# Patient Record
Sex: Male | Born: 1970 | Race: Black or African American | Hispanic: No | Smoking: Current every day smoker
Health system: Southern US, Community
[De-identification: ages and names within clinical notes are randomized; demographics above are authoritative.]

## PROBLEM LIST (undated history)

## (undated) DIAGNOSIS — I219 Acute myocardial infarction, unspecified: Secondary | ICD-10-CM

## (undated) DIAGNOSIS — I2510 Atherosclerotic heart disease of native coronary artery without angina pectoris: Secondary | ICD-10-CM

## (undated) DIAGNOSIS — I213 ST elevation (STEMI) myocardial infarction of unspecified site: Secondary | ICD-10-CM

## (undated) DIAGNOSIS — J45909 Unspecified asthma, uncomplicated: Secondary | ICD-10-CM

## (undated) DIAGNOSIS — Z5900 Homelessness unspecified: Secondary | ICD-10-CM

## (undated) DIAGNOSIS — I639 Cerebral infarction, unspecified: Secondary | ICD-10-CM

## (undated) DIAGNOSIS — D496 Neoplasm of unspecified behavior of brain: Secondary | ICD-10-CM

## (undated) DIAGNOSIS — Z91148 Patient's other noncompliance with medication regimen for other reason: Secondary | ICD-10-CM

## (undated) DIAGNOSIS — I5022 Chronic systolic (congestive) heart failure: Secondary | ICD-10-CM

## (undated) DIAGNOSIS — F1010 Alcohol abuse, uncomplicated: Secondary | ICD-10-CM

## (undated) DIAGNOSIS — N183 Chronic kidney disease, stage 3 unspecified: Secondary | ICD-10-CM

## (undated) DIAGNOSIS — F1410 Cocaine abuse, uncomplicated: Secondary | ICD-10-CM

---

## 2002-03-28 ENCOUNTER — Encounter: Payer: Self-pay | Admitting: Emergency Medicine

## 2002-03-28 ENCOUNTER — Emergency Department (HOSPITAL_COMMUNITY): Admission: EM | Admit: 2002-03-28 | Discharge: 2002-03-28 | Payer: Self-pay | Admitting: Emergency Medicine

## 2006-07-18 ENCOUNTER — Emergency Department (HOSPITAL_COMMUNITY): Admission: EM | Admit: 2006-07-18 | Discharge: 2006-07-18 | Payer: Self-pay | Admitting: Emergency Medicine

## 2009-01-04 ENCOUNTER — Emergency Department (HOSPITAL_COMMUNITY): Admission: EM | Admit: 2009-01-04 | Discharge: 2009-01-04 | Payer: Self-pay | Admitting: Emergency Medicine

## 2009-12-13 ENCOUNTER — Emergency Department (HOSPITAL_COMMUNITY): Admission: EM | Admit: 2009-12-13 | Discharge: 2009-12-13 | Payer: Self-pay | Admitting: Emergency Medicine

## 2011-12-31 ENCOUNTER — Emergency Department (HOSPITAL_COMMUNITY)
Admission: EM | Admit: 2011-12-31 | Discharge: 2011-12-31 | Disposition: A | Discharge status: Home / Self Care | Payer: Self-pay | Attending: Emergency Medicine | Admitting: Emergency Medicine

## 2011-12-31 ENCOUNTER — Ambulatory Visit (HOSPITAL_COMMUNITY): Payer: Self-pay

## 2011-12-31 ENCOUNTER — Encounter (HOSPITAL_COMMUNITY): Payer: Self-pay | Admitting: Emergency Medicine

## 2011-12-31 DIAGNOSIS — R05 Cough: Secondary | ICD-10-CM | POA: Insufficient documentation

## 2011-12-31 DIAGNOSIS — R059 Cough, unspecified: Secondary | ICD-10-CM | POA: Insufficient documentation

## 2011-12-31 DIAGNOSIS — R222 Localized swelling, mass and lump, trunk: Secondary | ICD-10-CM | POA: Insufficient documentation

## 2011-12-31 HISTORY — DX: Unspecified asthma, uncomplicated: J45.909

## 2011-12-31 NOTE — ED Notes (Signed)
Pt c/o chest congestion and cough with productive cough x several days; pt sts out of asthma inhaler

## 2013-03-23 ENCOUNTER — Emergency Department (HOSPITAL_COMMUNITY)
Admission: EM | Admit: 2013-03-23 | Discharge: 2013-03-23 | Disposition: A | Discharge status: Home / Self Care | Payer: Self-pay | Attending: Emergency Medicine | Admitting: Emergency Medicine

## 2013-03-23 ENCOUNTER — Emergency Department (HOSPITAL_COMMUNITY): Payer: Self-pay

## 2013-03-23 ENCOUNTER — Encounter (HOSPITAL_COMMUNITY): Payer: Self-pay | Admitting: Radiology

## 2013-03-23 DIAGNOSIS — Z23 Encounter for immunization: Secondary | ICD-10-CM | POA: Insufficient documentation

## 2013-03-23 DIAGNOSIS — S46909A Unspecified injury of unspecified muscle, fascia and tendon at shoulder and upper arm level, unspecified arm, initial encounter: Secondary | ICD-10-CM | POA: Insufficient documentation

## 2013-03-23 DIAGNOSIS — F17200 Nicotine dependence, unspecified, uncomplicated: Secondary | ICD-10-CM | POA: Insufficient documentation

## 2013-03-23 DIAGNOSIS — S022XXA Fracture of nasal bones, initial encounter for closed fracture: Secondary | ICD-10-CM | POA: Insufficient documentation

## 2013-03-23 DIAGNOSIS — S52123A Displaced fracture of head of unspecified radius, initial encounter for closed fracture: Secondary | ICD-10-CM | POA: Insufficient documentation

## 2013-03-23 DIAGNOSIS — T07XXXA Unspecified multiple injuries, initial encounter: Secondary | ICD-10-CM

## 2013-03-23 DIAGNOSIS — S52122A Displaced fracture of head of left radius, initial encounter for closed fracture: Secondary | ICD-10-CM

## 2013-03-23 DIAGNOSIS — F10929 Alcohol use, unspecified with intoxication, unspecified: Secondary | ICD-10-CM

## 2013-03-23 DIAGNOSIS — R040 Epistaxis: Secondary | ICD-10-CM | POA: Insufficient documentation

## 2013-03-23 DIAGNOSIS — J45909 Unspecified asthma, uncomplicated: Secondary | ICD-10-CM | POA: Insufficient documentation

## 2013-03-23 DIAGNOSIS — R51 Headache: Secondary | ICD-10-CM | POA: Insufficient documentation

## 2013-03-23 DIAGNOSIS — S4980XA Other specified injuries of shoulder and upper arm, unspecified arm, initial encounter: Secondary | ICD-10-CM | POA: Insufficient documentation

## 2013-03-23 DIAGNOSIS — F1010 Alcohol abuse, uncomplicated: Secondary | ICD-10-CM | POA: Insufficient documentation

## 2013-03-23 DIAGNOSIS — IMO0002 Reserved for concepts with insufficient information to code with codable children: Secondary | ICD-10-CM | POA: Insufficient documentation

## 2013-03-23 DIAGNOSIS — Z79899 Other long term (current) drug therapy: Secondary | ICD-10-CM | POA: Insufficient documentation

## 2013-03-23 LAB — CBC WITH DIFFERENTIAL/PLATELET
Basophils Absolute: 0 10*3/uL (ref 0.0–0.1)
HCT: 42.8 % (ref 39.0–52.0)
Lymphocytes Relative: 14 % (ref 12–46)
Monocytes Absolute: 0.6 10*3/uL (ref 0.1–1.0)
Neutro Abs: 14.4 10*3/uL — ABNORMAL HIGH (ref 1.7–7.7)
RDW: 13.9 % (ref 11.5–15.5)
WBC: 17.8 10*3/uL — ABNORMAL HIGH (ref 4.0–10.5)

## 2013-03-23 LAB — ETHANOL: Alcohol, Ethyl (B): 136 mg/dL — ABNORMAL HIGH (ref 0–11)

## 2013-03-23 LAB — BASIC METABOLIC PANEL
CO2: 21 mEq/L (ref 19–32)
Chloride: 100 mEq/L (ref 96–112)
Sodium: 137 mEq/L (ref 135–145)

## 2013-03-23 MED ORDER — SODIUM CHLORIDE 0.9 % IV SOLN: INTRAVENOUS | Status: DC

## 2013-03-23 MED ORDER — ONDANSETRON HCL 4 MG/2ML IJ SOLN
4.0000 mg | Freq: Once | INTRAMUSCULAR | Status: AC
  Administered 2013-03-23: 4 mg via INTRAVENOUS
  Filled 2013-03-23: qty 2

## 2013-03-23 MED ORDER — NAPROXEN 500 MG PO TABS: 500.0000 mg | ORAL_TABLET | Freq: Two times a day (BID) | ORAL | Status: DC

## 2013-03-23 MED ORDER — SODIUM CHLORIDE 0.9 % IV SOLN
INTRAVENOUS | Status: DC
  Administered 2013-03-23: 05:00:00 via INTRAVENOUS

## 2013-03-23 MED ORDER — HYDROMORPHONE HCL PF 1 MG/ML IJ SOLN
1.0000 mg | Freq: Once | INTRAMUSCULAR | Status: AC
  Administered 2013-03-23: 1 mg via INTRAVENOUS
  Filled 2013-03-23: qty 1

## 2013-03-23 MED ORDER — SODIUM CHLORIDE 0.9 % IV BOLUS (SEPSIS)
1000.0000 mL | Freq: Once | INTRAVENOUS | Status: AC
  Administered 2013-03-23: 1000 mL via INTRAVENOUS

## 2013-03-23 MED ORDER — HYDROCODONE-ACETAMINOPHEN 5-325 MG PO TABS: 1.0000 | ORAL_TABLET | Freq: Four times a day (QID) | ORAL | Status: DC | PRN

## 2013-03-23 MED ORDER — TETANUS-DIPHTH-ACELL PERTUSSIS 5-2.5-18.5 LF-MCG/0.5 IM SUSP
0.5000 mL | Freq: Once | INTRAMUSCULAR | Status: AC
  Administered 2013-03-23: 0.5 mL via INTRAMUSCULAR
  Filled 2013-03-23: qty 0.5
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
## 2013-03-23 NOTE — Progress Notes (Signed)
Orthopedic Tech Progress Note Patient Details:  Craig Schmidt Sep 27, 1970 [id number]  Ortho Devices Type of Ortho Device: Arm sling;Long arm splint   Haskell Flirt 03/23/2013, 6:06 AM

## 2013-03-23 NOTE — ED Notes (Signed)
Patient returned from CT

## 2013-03-23 NOTE — ED Notes (Signed)
Dr. Zackowski at bedside  
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
## 2013-03-23 NOTE — ED Notes (Signed)
Pt given bus pass ?

## 2014-12-14 ENCOUNTER — Encounter (HOSPITAL_COMMUNITY): Payer: Self-pay | Admitting: Cardiology

## 2014-12-14 ENCOUNTER — Emergency Department (HOSPITAL_COMMUNITY)
Admission: EM | Admit: 2014-12-14 | Discharge: 2014-12-14 | Disposition: A | Discharge status: Home / Self Care | Payer: No Typology Code available for payment source | Attending: Emergency Medicine | Admitting: Emergency Medicine

## 2014-12-14 DIAGNOSIS — Z791 Long term (current) use of non-steroidal anti-inflammatories (NSAID): Secondary | ICD-10-CM | POA: Diagnosis not present

## 2014-12-14 DIAGNOSIS — Z720 Tobacco use: Secondary | ICD-10-CM | POA: Insufficient documentation

## 2014-12-14 DIAGNOSIS — Z79899 Other long term (current) drug therapy: Secondary | ICD-10-CM | POA: Insufficient documentation

## 2014-12-14 DIAGNOSIS — J45909 Unspecified asthma, uncomplicated: Secondary | ICD-10-CM | POA: Diagnosis present

## 2014-12-14 DIAGNOSIS — J45901 Unspecified asthma with (acute) exacerbation: Secondary | ICD-10-CM | POA: Insufficient documentation

## 2014-12-14 MED ORDER — IPRATROPIUM BROMIDE 0.02 % IN SOLN
0.5000 mg | Freq: Once | RESPIRATORY_TRACT | Status: AC
  Administered 2014-12-14: 0.5 mg via RESPIRATORY_TRACT
  Filled 2014-12-14: qty 2.5

## 2014-12-14 MED ORDER — PREDNISONE 20 MG PO TABS
60.0000 mg | ORAL_TABLET | Freq: Once | ORAL | Status: AC
  Administered 2014-12-14: 60 mg via ORAL
  Filled 2014-12-14: qty 3

## 2014-12-14 MED ORDER — ALBUTEROL SULFATE (2.5 MG/3ML) 0.083% IN NEBU
5.0000 mg | INHALATION_SOLUTION | Freq: Once | RESPIRATORY_TRACT | Status: AC
  Administered 2014-12-14: 5 mg via RESPIRATORY_TRACT
  Filled 2014-12-14: qty 6

## 2014-12-14 MED ORDER — ALBUTEROL SULFATE (2.5 MG/3ML) 0.083% IN NEBU
2.5000 mg | INHALATION_SOLUTION | Freq: Four times a day (QID) | RESPIRATORY_TRACT | Status: DC | PRN
Start: 2014-12-14 — End: 2015-05-18

## 2014-12-14 MED ORDER — ALBUTEROL SULFATE HFA 108 (90 BASE) MCG/ACT IN AERS
2.0000 | INHALATION_SPRAY | Freq: Once | RESPIRATORY_TRACT | Status: AC
  Administered 2014-12-14: 2 via RESPIRATORY_TRACT
  Filled 2014-12-14: qty 6.7

## 2014-12-14 MED ORDER — PREDNISONE 10 MG PO TABS: ORAL_TABLET | ORAL | Status: DC

## 2014-12-14 NOTE — ED Notes (Signed)
Patient to the Ed with C/O an acute asthma attack.  States that he has been hanging insulation which made things worse. Onset was 2 days ago. Reports that he is out of his inhalers.

## 2014-12-14 NOTE — ED Provider Notes (Signed)
CSN: [id number]     Arrival date & time 12/14/14  1251 History  This chart was scribed for non-physician practitioner Jeannett Senior, [name] working with Jola Schmidt, MD by Zola Button, ED Scribe. This patient was seen in room TR09C/TR09C and the patient's care was started at 2:05 PM.    Chief Complaint  Patient presents with  . Asthma   The history is provided by the patient. No language interpreter was used.   HPI Comments: Craig Schmidt is a 44 y.o. male with a hx of asthma who presents to the Emergency Department complaining of worsening SOB that started a few days ago. Patient states that this is due to a flare-up of his asthma, worse than previous flare-ups of asthma. He also reports having chest tightness and mild throat irritation. He does have some pain with breathing. Patient is out of his inhaler. He notes that the breathing treatment he had here has provided significant relief to his symptoms. 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 Patient denies smoking.   Past Medical History  Diagnosis Date  . Asthma    History reviewed. No pertinent past surgical history. History reviewed. No pertinent family history. History  Substance Use Topics  . Smoking status: Current Every Day Smoker  . Smokeless tobacco: Not on file  . Alcohol Use: Yes    Review of Systems  Constitutional: Negative for fever and chills.  Respiratory: Positive for chest tightness and shortness of breath. Negative for cough.   Cardiovascular: Negative for chest pain.  Neurological: Negative for dizziness, weakness and headaches.      Allergies  Review of patient's allergies indicates no known allergies.  Home Medications   Prior to Admission medications   Medication Sig Start Date End Date Taking? Authorizing Provider  albuterol (PROVENTIL HFA;VENTOLIN HFA) 108 (90 BASE) MCG/ACT inhaler Inhale 2 puffs into the lungs every 6 (six) hours as needed. For  shortness of breath/wheezing    Historical Provider, MD  HYDROcodone-acetaminophen (NORCO/VICODIN) 5-325 MG per tablet Take 1-2 tablets by mouth every 6 (six) hours as needed for pain. 03/23/13   Fredia Sorrow, MD  naproxen (NAPROSYN) 500 MG tablet Take 1 tablet (500 mg total) by mouth 2 (two) times daily. 03/23/13   Fredia Sorrow, MD   BP 126/84 mmHg  Pulse 98  Temp(Src) 97.9 F (36.6 C) (Oral)  Resp 18  Ht 5\' 7"  (1.702 m)  Wt 201 lb (91.173 kg)  BMI 31.47 kg/m2  SpO2 95% Physical Exam  Constitutional: He is oriented to person, place, and time. He appears well-developed and well-nourished. No distress.  HENT:  Head: Normocephalic and atraumatic.  Right Ear: External ear normal.  Left Ear: External ear normal.  Nose: Nose normal.  Mouth/Throat: Oropharynx is clear and moist. No oropharyngeal exudate.  Eyes: Conjunctivae are normal. Pupils are equal, round, and reactive to light.  Neck: Neck supple.  Cardiovascular: Normal rate, regular rhythm and normal heart sounds.   Pulmonary/Chest: Effort normal. No respiratory distress. He has wheezes. He has no rales.  Inspiratory and expiratory wheezes bilaterally  Musculoskeletal: He exhibits no edema.  Neurological: He is alert and oriented to person, place, and time. No cranial nerve deficit.  Skin: Skin is warm and dry. No rash noted.  Psychiatric: He has a normal mood and affect. His behavior is normal.  Nursing note and vitals reviewed.   ED Course  Procedures  DIAGNOSTIC STUDIES: Oxygen Saturation is 95% on room air, adequate by  my interpretation.    COORDINATION OF CARE: 2:13 PM-Discussed treatment plan which includes prednisone, inhaler and nebulizer solution with patient/guardian at bedside and patient/guardian agreed to plan.    Labs Review Labs Reviewed - No data to display  Imaging Review No results found.   EKG Interpretation None      MDM   Final diagnoses:  Asthma exacerbation     patient is here with  acute asthma exacerbation, denies any chest pain or cough. No upper respiratory symptoms. States he was exposed to dust at work. Ran out of his inhaler. Patient is wheezing, vital signs are normal. Will try a breathing treatment and  60 mg of prednisone.  Patient is feeling much better after neb. He states he thinks is ready to go home. Breathing is nonlabored, nor stridor distress. Lungs are now clear bilaterally. Patient is not coughing, no chest pain, did not think any imaging or further testing indicated at this time. Will discharge home with a short prednisone taper, inhaler, follow up with primary care doctor.  Filed Vitals:   12/14/14 1306 12/14/14 1429  BP: 126/84 135/78  Pulse: 98 90  Temp: 97.9 F (36.6 C) 97.7 F (36.5 C)  TempSrc: Oral Oral  Resp: 18 16  Height: 5\' 7"  (1.702 m)   Weight: 201 lb (91.173 kg)   SpO2: 95% 100%   I personally performed the services described in this documentation, which was scribed in my presence. The recorded information has been reviewed and is accurate.   Jeannett Senior, [name] 12/14/14 [place], MD 12/15/14 [phone]

## 2014-12-14 NOTE — Discharge Instructions (Signed)
Use inhaler 2 puffs every 4 hours for the next 3 days then as needed. You can also do breathing treatments. Prednisone as prescribed until all gone starting tomorrow. Please follow-up with your doctor.   Asthma Asthma is a recurring condition in which the airways tighten and narrow. Asthma can make it difficult to breathe. It can cause coughing, wheezing, and shortness of breath. Asthma episodes, also called asthma attacks, range from minor to life-threatening. Asthma cannot be cured, but medicines and lifestyle changes can help control it. CAUSES Asthma is believed to be caused by inherited (genetic) and environmental factors, but its exact cause is unknown. Asthma may be triggered by allergens, lung infections, or irritants in the air. Asthma triggers are different for each person. Common triggers include:   Animal dander.  Dust mites.  Cockroaches.  Pollen from trees or grass.  Mold.  Smoke.  Air pollutants such as dust, household cleaners, hair sprays, aerosol sprays, paint fumes, strong chemicals, or strong odors.  Cold air, weather changes, and winds (which increase molds and pollens in the air).  Strong emotional expressions such as crying or laughing hard.  Stress.  Certain medicines (such as aspirin) or types of drugs (such as beta-blockers).  Sulfites in foods and drinks. Foods and drinks that may contain sulfites include dried fruit, potato chips, and sparkling grape juice.  Infections or inflammatory conditions such as the flu, a cold, or an inflammation of the nasal membranes (rhinitis).  Gastroesophageal reflux disease (GERD).  Exercise or strenuous activity. SYMPTOMS Symptoms may occur immediately after asthma is triggered or many hours later. Symptoms include:  Wheezing.  Excessive nighttime or early morning coughing.  Frequent or severe coughing with a common cold.  Chest tightness.  Shortness of breath. DIAGNOSIS  The diagnosis of asthma is made by  a review of your medical history and a physical exam. Tests may also be performed. These may include:  Lung function studies. These tests show how much air you breathe in and out.  Allergy tests.  Imaging tests such as X-rays. TREATMENT  Asthma cannot be cured, but it can usually be controlled. Treatment involves identifying and avoiding your asthma triggers. It also involves medicines. There are 2 classes of medicine used for asthma treatment:   Controller medicines. These prevent asthma symptoms from occurring. They are usually taken every day.  Reliever or rescue medicines. These quickly relieve asthma symptoms. They are used as needed and provide short-term relief. Your health care provider will help you create an asthma action plan. An asthma action plan is a written plan for managing and treating your asthma attacks. It includes a list of your asthma triggers and how they may be avoided. It also includes information on when medicines should be taken and when their dosage should be changed. An action plan may also involve the use of a device called a peak flow meter. A peak flow meter measures how well the lungs are working. It helps you monitor your condition. HOME CARE INSTRUCTIONS   Take medicines only as directed by your health care provider. Speak with your health care provider if you have questions about how or when to take the medicines.  Use a peak flow meter as directed by your health care provider. Record and keep track of readings.  Understand and use the action plan to help minimize or stop an asthma attack without needing to seek medical care.  Control your home environment in the following ways to help prevent asthma attacks:  Do not smoke. Avoid being exposed to secondhand smoke.  Change your heating and air conditioning filter regularly.  Limit your use of fireplaces and wood stoves.  Get rid of pests (such as roaches and mice) and their droppings.  Throw away  plants if you see mold on them.  Clean your floors and dust regularly. Use unscented cleaning products.  Try to have someone else vacuum for you regularly. Stay out of rooms while they are being vacuumed and for a short while afterward. If you vacuum, use a dust mask from a hardware store, a double-layered or microfilter vacuum cleaner bag, or a vacuum cleaner with a HEPA filter.  Replace carpet with wood, tile, or vinyl flooring. Carpet can trap dander and dust.  Use allergy-proof pillows, mattress covers, and box spring covers.  Wash bed sheets and blankets every week in hot water and dry them in a dryer.  Use blankets that are made of polyester or cotton.  Clean bathrooms and kitchens with bleach. If possible, have someone repaint the walls in these rooms with mold-resistant paint. Keep out of the rooms that are being cleaned and painted.  Wash hands frequently. SEEK MEDICAL CARE IF:   You have wheezing, shortness of breath, or a cough even if taking medicine to prevent attacks.  The colored mucus you cough up (sputum) is thicker than usual.  Your sputum changes from clear or white to yellow, green, gray, or bloody.  You have any problems that may be related to the medicines you are taking (such as a rash, itching, swelling, or trouble breathing).  You are using a reliever medicine more than 2-3 times per week.  Your peak flow is still at 50-79% of your personal best after following your action plan for 1 hour.  You have a fever. SEEK IMMEDIATE MEDICAL CARE IF:   You seem to be getting worse and are unresponsive to treatment during an asthma attack.  You are short of breath even at rest.  You get short of breath when doing very little physical activity.  You have difficulty eating, drinking, or talking due to asthma symptoms.  You develop chest pain.  You develop a fast heartbeat.  You have a bluish color to your lips or fingernails.  You are light-headed, dizzy, or  faint.  Your peak flow is less than 50% of your personal best. MAKE SURE YOU:   Understand these instructions.  Will watch your condition.  Will get help right away if you are not doing well or get worse. Document Released: 07/09/2005 Document Revised: 11/23/2013 Document Reviewed: 02/05/2013 [facility] Patient Information 2015 [place], [place]. This information is not intended to replace advice given to you by your health care provider. Make sure you discuss any questions you have with your health care provider.   Asthma, Acute Bronchospasm Acute bronchospasm caused by asthma is also referred to as an asthma attack. Bronchospasm means your air passages become narrowed. The narrowing is caused by inflammation and tightening of the muscles in the air tubes (bronchi) in your lungs. This can make it hard to breathe or cause you to wheeze and cough. CAUSES Possible triggers are:  Animal dander from the skin, hair, or feathers of animals.  Dust mites contained in house dust.  Cockroaches.  Pollen from trees or grass.  Mold.  Cigarette or tobacco smoke.  Air pollutants such as dust, household cleaners, hair sprays, aerosol sprays, paint fumes, strong chemicals, or strong odors.  Cold air or weather changes. Cold  air may trigger inflammation. Winds increase molds and pollens in the air.  Strong emotions such as crying or laughing hard.  Stress.  Certain medicines such as aspirin or beta-blockers.  Sulfites in foods and drinks, such as dried fruits and wine.  Infections or inflammatory conditions, such as a flu, cold, or inflammation of the nasal membranes (rhinitis).  Gastroesophageal reflux disease (GERD). GERD is a condition where stomach acid backs up into your esophagus.  Exercise or strenuous activity. SIGNS AND SYMPTOMS   Wheezing.  Excessive coughing, particularly at night.  Chest tightness.  Shortness of breath. DIAGNOSIS  Your health care provider will ask you  about your medical history and perform a physical exam. A chest X-ray or blood testing may be performed to look for other causes of your symptoms or other conditions that may have triggered your asthma attack. TREATMENT  Treatment is aimed at reducing inflammation and opening up the airways in your lungs. Most asthma attacks are treated with inhaled medicines. These include quick relief or rescue medicines (such as bronchodilators) and controller medicines (such as inhaled corticosteroids). These medicines are sometimes given through an inhaler or a nebulizer. Systemic steroid medicine taken by mouth or given through an IV tube also can be used to reduce the inflammation when an attack is moderate or severe. Antibiotic medicines are only used if a bacterial infection is present.  HOME CARE INSTRUCTIONS   Rest.  Drink plenty of liquids. This helps the mucus to remain thin and be easily coughed up. Only use caffeine in moderation and do not use alcohol until you have recovered from your illness.  Do not smoke. Avoid being exposed to secondhand smoke.  You play a critical role in keeping yourself in good health. Avoid exposure to things that cause you to wheeze or to have breathing problems.  Keep your medicines up-to-date and available. Carefully follow your health care provider's treatment plan.  Take your medicine exactly as prescribed.  When pollen or pollution is bad, keep windows closed and use an air conditioner or go to places with air conditioning.  Asthma requires careful medical care. See your health care provider for a follow-up as advised. If you are more than [redacted] weeks pregnant and you were prescribed any new medicines, let your obstetrician know about the visit and how you are doing. Follow up with your health care provider as directed.  After you have recovered from your asthma attack, make an appointment with your outpatient doctor to talk about ways to reduce the likelihood of  future attacks. If you do not have a doctor who manages your asthma, make an appointment with a primary care doctor to discuss your asthma. SEEK IMMEDIATE MEDICAL CARE IF:   You are getting worse.  You have trouble breathing. If severe, call your local emergency services (911 in the [place]  You develop chest pain or discomfort.  You are vomiting.  You are not able to keep fluids down.  You are coughing up yellow, green, brown, or bloody sputum.  You have a fever and your symptoms suddenly get worse.  You have trouble swallowing. MAKE SURE YOU:   Understand these instructions.  Will watch your condition.  Will get help right away if you are not doing well or get worse. Document Released: 10/24/2006 Document Revised: 07/14/2013 Document Reviewed: 01/14/2013 [facility] Patient Information 2015 [place], [place]. This information is not intended to replace advice given to you by your health care provider. Make sure you discuss any questions  you have with your health care provider.

## 2014-12-14 NOTE — ED Notes (Signed)
Pt reports he has been having an asthma attack over the past couple of days. Does not have an inhaler at home.

## 2015-02-26 ENCOUNTER — Encounter (HOSPITAL_COMMUNITY): Payer: Self-pay | Admitting: Family Medicine

## 2015-02-26 ENCOUNTER — Emergency Department (HOSPITAL_COMMUNITY)
Admission: EM | Admit: 2015-02-26 | Discharge: 2015-02-26 | Disposition: A | Discharge status: Home / Self Care | Payer: No Typology Code available for payment source | Attending: Emergency Medicine | Admitting: Emergency Medicine

## 2015-02-26 DIAGNOSIS — K088 Other specified disorders of teeth and supporting structures: Secondary | ICD-10-CM | POA: Diagnosis not present

## 2015-02-26 DIAGNOSIS — J45909 Unspecified asthma, uncomplicated: Secondary | ICD-10-CM | POA: Diagnosis not present

## 2015-02-26 DIAGNOSIS — K0889 Other specified disorders of teeth and supporting structures: Secondary | ICD-10-CM

## 2015-02-26 MED ORDER — NAPROXEN 500 MG PO TABS: 500.0000 mg | ORAL_TABLET | Freq: Two times a day (BID) | ORAL | Status: DC

## 2015-02-26 MED ORDER — OXYCODONE-ACETAMINOPHEN 5-325 MG PO TABS
2.0000 | ORAL_TABLET | Freq: Once | ORAL | Status: AC
  Administered 2015-02-26: 2 via ORAL
  Filled 2015-02-26: qty 2

## 2015-02-26 NOTE — Discharge Instructions (Signed)
Dental Pain Follow up with a dentist.  Take naproxen for tooth pain.  A tooth ache may be caused by cavities (tooth decay). Cavities expose the nerve of the tooth to air and hot or cold temperatures. It may come from an infection or abscess (also called a boil or furuncle) around your tooth. It is also often caused by dental caries (tooth decay). This causes the pain you are having. DIAGNOSIS  Your caregiver can diagnose this problem by exam. TREATMENT   If caused by an infection, it may be treated with medications which kill germs (antibiotics) and pain medications as prescribed by your caregiver. Take medications as directed.  Only take over-the-counter or prescription medicines for pain, discomfort, or fever as directed by your caregiver.  Whether the tooth ache today is caused by infection or dental disease, you should see your dentist as soon as possible for further care. SEEK MEDICAL CARE IF: The exam and treatment you received today has been provided on an emergency basis only. This is not a substitute for complete medical or dental care. If your problem worsens or new problems (symptoms) appear, and you are unable to meet with your dentist, call or return to this location. SEEK IMMEDIATE MEDICAL CARE IF:   You have a fever.  You develop redness and swelling of your face, jaw, or neck.  You are unable to open your mouth.  You have severe pain uncontrolled by pain medicine. MAKE SURE YOU:   Understand these instructions.  Will watch your condition.  Will get help right away if you are not doing well or get worse. Document Released: 07/09/2005 Document Revised: 10/01/2011 Document Reviewed: 02/25/2008 [facility] Patient Information 2015 [place], [place]. This information is not intended to replace advice given to you by your health care provider. Make sure you discuss any questions you have with your health care provider.

## 2015-02-26 NOTE — ED Notes (Signed)
Pt here for dental pain x 1 week. sts upper and lower pain on the right side and whole in tooth.
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
## 2015-05-14 ENCOUNTER — Inpatient Hospital Stay (HOSPITAL_COMMUNITY)
Admission: EM | Admit: 2015-05-14 | Discharge: 2015-05-18 | DRG: 896 | Disposition: A | Discharge status: Home Health | Payer: Self-pay | Attending: Internal Medicine | Admitting: Internal Medicine

## 2015-05-14 ENCOUNTER — Emergency Department (HOSPITAL_COMMUNITY): Payer: Self-pay

## 2015-05-14 ENCOUNTER — Inpatient Hospital Stay (HOSPITAL_COMMUNITY): Payer: Self-pay

## 2015-05-14 ENCOUNTER — Encounter (HOSPITAL_COMMUNITY): Payer: Self-pay | Admitting: *Deleted

## 2015-05-14 DIAGNOSIS — R918 Other nonspecific abnormal finding of lung field: Secondary | ICD-10-CM | POA: Diagnosis present

## 2015-05-14 DIAGNOSIS — R51 Headache: Secondary | ICD-10-CM

## 2015-05-14 DIAGNOSIS — F10920 Alcohol use, unspecified with intoxication, uncomplicated: Secondary | ICD-10-CM

## 2015-05-14 DIAGNOSIS — Y907 Blood alcohol level of 200-239 mg/100 ml: Secondary | ICD-10-CM | POA: Diagnosis present

## 2015-05-14 DIAGNOSIS — A419 Sepsis, unspecified organism: Secondary | ICD-10-CM | POA: Diagnosis present

## 2015-05-14 DIAGNOSIS — R680 Hypothermia, not associated with low environmental temperature: Secondary | ICD-10-CM | POA: Diagnosis present

## 2015-05-14 DIAGNOSIS — Z6828 Body mass index (BMI) 28.0-28.9, adult: Secondary | ICD-10-CM

## 2015-05-14 DIAGNOSIS — E872 Acidosis, unspecified: Secondary | ICD-10-CM | POA: Diagnosis present

## 2015-05-14 DIAGNOSIS — W19XXXA Unspecified fall, initial encounter: Secondary | ICD-10-CM

## 2015-05-14 DIAGNOSIS — R52 Pain, unspecified: Secondary | ICD-10-CM

## 2015-05-14 DIAGNOSIS — G9340 Encephalopathy, unspecified: Secondary | ICD-10-CM | POA: Diagnosis present

## 2015-05-14 DIAGNOSIS — G92 Toxic encephalopathy: Secondary | ICD-10-CM | POA: Diagnosis present

## 2015-05-14 DIAGNOSIS — T405X1A Poisoning by cocaine, accidental (unintentional), initial encounter: Secondary | ICD-10-CM | POA: Diagnosis present

## 2015-05-14 DIAGNOSIS — Z5900 Homelessness unspecified: Secondary | ICD-10-CM

## 2015-05-14 DIAGNOSIS — F14129 Cocaine abuse with intoxication, unspecified: Secondary | ICD-10-CM | POA: Diagnosis present

## 2015-05-14 DIAGNOSIS — S0990XA Unspecified injury of head, initial encounter: Secondary | ICD-10-CM

## 2015-05-14 DIAGNOSIS — M7989 Other specified soft tissue disorders: Secondary | ICD-10-CM | POA: Diagnosis not present

## 2015-05-14 DIAGNOSIS — T68XXXA Hypothermia, initial encounter: Secondary | ICD-10-CM | POA: Diagnosis present

## 2015-05-14 DIAGNOSIS — D333 Benign neoplasm of cranial nerves: Secondary | ICD-10-CM | POA: Diagnosis present

## 2015-05-14 DIAGNOSIS — F10129 Alcohol abuse with intoxication, unspecified: Principal | ICD-10-CM | POA: Diagnosis present

## 2015-05-14 DIAGNOSIS — R609 Edema, unspecified: Secondary | ICD-10-CM

## 2015-05-14 DIAGNOSIS — Z23 Encounter for immunization: Secondary | ICD-10-CM

## 2015-05-14 DIAGNOSIS — J45909 Unspecified asthma, uncomplicated: Secondary | ICD-10-CM | POA: Diagnosis present

## 2015-05-14 DIAGNOSIS — E162 Hypoglycemia, unspecified: Secondary | ICD-10-CM | POA: Diagnosis present

## 2015-05-14 DIAGNOSIS — F17200 Nicotine dependence, unspecified, uncomplicated: Secondary | ICD-10-CM | POA: Diagnosis present

## 2015-05-14 DIAGNOSIS — T50905A Adverse effect of unspecified drugs, medicaments and biological substances, initial encounter: Secondary | ICD-10-CM | POA: Diagnosis present

## 2015-05-14 DIAGNOSIS — E43 Unspecified severe protein-calorie malnutrition: Secondary | ICD-10-CM | POA: Insufficient documentation

## 2015-05-14 DIAGNOSIS — M25562 Pain in left knee: Secondary | ICD-10-CM | POA: Diagnosis present

## 2015-05-14 DIAGNOSIS — G9389 Other specified disorders of brain: Secondary | ICD-10-CM

## 2015-05-14 DIAGNOSIS — R519 Headache, unspecified: Secondary | ICD-10-CM

## 2015-05-14 DIAGNOSIS — T510X1A Toxic effect of ethanol, accidental (unintentional), initial encounter: Secondary | ICD-10-CM | POA: Diagnosis present

## 2015-05-14 DIAGNOSIS — R911 Solitary pulmonary nodule: Secondary | ICD-10-CM

## 2015-05-14 LAB — URINALYSIS, ROUTINE W REFLEX MICROSCOPIC
Bilirubin Urine: NEGATIVE
GLUCOSE, UA: NEGATIVE mg/dL
Hgb urine dipstick: NEGATIVE
KETONES UR: NEGATIVE mg/dL
LEUKOCYTES UA: NEGATIVE
NITRITE: NEGATIVE
PROTEIN: NEGATIVE mg/dL
Specific Gravity, Urine: 1.006 (ref 1.005–1.030)
Urobilinogen, UA: 0.2 mg/dL (ref 0.0–1.0)
pH: 5 (ref 5.0–8.0)

## 2015-05-14 LAB — COMPREHENSIVE METABOLIC PANEL
ALBUMIN: 4.2 g/dL (ref 3.5–5.0)
ALK PHOS: 57 U/L (ref 38–126)
ALT: 11 U/L — ABNORMAL LOW (ref 17–63)
ALT: 11 U/L — ABNORMAL LOW (ref 17–63)
ANION GAP: 10 (ref 5–15)
ANION GAP: 7 (ref 5–15)
AST: 34 U/L (ref 15–41)
AST: 31 U/L (ref 15–41)
Albumin: 3.6 g/dL (ref 3.5–5.0)
Alkaline Phosphatase: 63 U/L (ref 38–126)
BILIRUBIN TOTAL: 0.4 mg/dL (ref 0.3–1.2)
BUN: 12 mg/dL (ref 6–20)
BUN: 11 mg/dL (ref 6–20)
CALCIUM: 9.2 mg/dL (ref 8.9–10.3)
CALCIUM: 8.4 mg/dL — AB (ref 8.9–10.3)
CHLORIDE: 108 mmol/L (ref 101–111)
CO2: 24 mmol/L (ref 22–32)
CO2: 25 mmol/L (ref 22–32)
CREATININE: 1.15 mg/dL (ref 0.61–1.24)
Chloride: 109 mmol/L (ref 101–111)
Creatinine, Ser: 1.1 mg/dL (ref 0.61–1.24)
GFR calc Af Amer: >60 mL/min (ref >60)
GFR calc non Af Amer: >60 mL/min (ref >60)
GFR calc non Af Amer: >60 mL/min (ref >60)
Glucose, Bld: 84 mg/dL (ref 65–99)
Glucose, Bld: 108 mg/dL — ABNORMAL HIGH (ref 65–99)
POTASSIUM: 3.9 mmol/L (ref 3.5–5.1)
Potassium: 3.6 mmol/L (ref 3.5–5.1)
SODIUM: 142 mmol/L (ref 135–145)
Sodium: 141 mmol/L (ref 135–145)
TOTAL PROTEIN: 6.8 g/dL (ref 6.5–8.1)
Total Bilirubin: 0.6 mg/dL (ref 0.3–1.2)
Total Protein: 7.5 g/dL (ref 6.5–8.1)

## 2015-05-14 LAB — GLUCOSE, CAPILLARY
Glucose-Capillary: 91 mg/dL (ref 65–99)
Glucose-Capillary: 99 mg/dL (ref 65–99)
Glucose-Capillary: 131 mg/dL — ABNORMAL HIGH (ref 65–99)

## 2015-05-14 LAB — MAGNESIUM: Magnesium: 2 mg/dL (ref 1.7–2.4)

## 2015-05-14 LAB — CBC
HCT: 43.5 % (ref 39.0–52.0)
Hemoglobin: 14.4 g/dL (ref 13.0–17.0)
MCH: 31.7 pg (ref 26.0–34.0)
MCHC: 33.1 g/dL (ref 30.0–36.0)
MCV: 95.8 fL (ref 78.0–100.0)
PLATELETS: 272 10*3/uL (ref 150–400)
RBC: 4.54 MIL/uL (ref 4.22–5.81)
RDW: 14.1 % (ref 11.5–15.5)
WBC: 8.9 10*3/uL (ref 4.0–10.5)

## 2015-05-14 LAB — MRSA PCR SCREENING: MRSA by PCR: NEGATIVE

## 2015-05-14 LAB — ETHANOL: Alcohol, Ethyl (B): 224 mg/dL — ABNORMAL HIGH (ref <5)

## 2015-05-14 LAB — PHOSPHORUS: Phosphorus: 3.7 mg/dL (ref 2.5–4.6)

## 2015-05-14 LAB — I-STAT CG4 LACTIC ACID, ED: LACTIC ACID, VENOUS: 2.32 mmol/L — AB (ref 0.5–2.0)

## 2015-05-14 LAB — CBC WITH DIFFERENTIAL/PLATELET
BASOS ABS: 0 10*3/uL (ref 0.0–0.1)
BASOS PCT: 0 %
EOS ABS: 0.2 10*3/uL (ref 0.0–0.7)
Eosinophils Relative: 2 %
HCT: 46.1 % (ref 39.0–52.0)
Hemoglobin: 15.5 g/dL (ref 13.0–17.0)
Lymphocytes Relative: 31 %
Lymphs Abs: 3.7 10*3/uL (ref 0.7–4.0)
MCH: 31.4 pg (ref 26.0–34.0)
MCHC: 33.6 g/dL (ref 30.0–36.0)
MCV: 93.5 fL (ref 78.0–100.0)
MONO ABS: 0.6 10*3/uL (ref 0.1–1.0)
MONOS PCT: 5 %
Neutro Abs: 7.4 10*3/uL (ref 1.7–7.7)
Neutrophils Relative %: 62 %
PLATELETS: 310 10*3/uL (ref 150–400)
RBC: 4.93 MIL/uL (ref 4.22–5.81)
RDW: 13.6 % (ref 11.5–15.5)
WBC: 11.9 10*3/uL — ABNORMAL HIGH (ref 4.0–10.5)

## 2015-05-14 LAB — ACETAMINOPHEN LEVEL

## 2015-05-14 LAB — CBG MONITORING, ED
GLUCOSE-CAPILLARY: 90 mg/dL (ref 65–99)
GLUCOSE-CAPILLARY: 64 mg/dL — AB (ref 65–99)
GLUCOSE-CAPILLARY: 79 mg/dL (ref 65–99)

## 2015-05-14 LAB — LIPASE, BLOOD: LIPASE: 28 U/L (ref 11–51)

## 2015-05-14 LAB — PROTIME-INR
INR: 1.01 (ref 0.00–1.49)
PROTHROMBIN TIME: 13.5 s (ref 11.6–15.2)

## 2015-05-14 LAB — LACTIC ACID, PLASMA
LACTIC ACID, VENOUS: 3.1 mmol/L — AB (ref 0.5–2.0)
Lactic Acid, Venous: 1.8 mmol/L (ref 0.5–2.0)

## 2015-05-14 LAB — I-STAT TROPONIN, ED: TROPONIN I, POC: 0.01 ng/mL (ref 0.00–0.08)

## 2015-05-14 LAB — RAPID URINE DRUG SCREEN, HOSP PERFORMED
AMPHETAMINES: NOT DETECTED
Barbiturates: NOT DETECTED
Benzodiazepines: NOT DETECTED
Cocaine: POSITIVE — AB
OPIATES: NOT DETECTED
TETRAHYDROCANNABINOL: NOT DETECTED

## 2015-05-14 LAB — TSH
TSH: 0.988 u[IU]/mL (ref 0.350–4.500)
TSH: 0.862 u[IU]/mL (ref 0.350–4.500)

## 2015-05-14 LAB — SALICYLATE LEVEL: Salicylate Lvl: <4 mg/dL (ref 2.8–30.0)

## 2015-05-14 LAB — AMMONIA: AMMONIA: 26 umol/L (ref 9–35)

## 2015-05-14 LAB — APTT: APTT: 34 s (ref 24–37)

## 2015-05-14 LAB — PROCALCITONIN: Procalcitonin: <0.1 ng/mL

## 2015-05-14 MED ORDER — ONDANSETRON HCL 4 MG PO TABS: 4.0000 mg | ORAL_TABLET | Freq: Four times a day (QID) | ORAL | Status: DC | PRN

## 2015-05-14 MED ORDER — INFLUENZA VAC SPLIT QUAD 0.5 ML IM SUSY
0.5000 mL | PREFILLED_SYRINGE | INTRAMUSCULAR | Status: AC
  Administered 2015-05-15: 0.5 mL via INTRAMUSCULAR
  Filled 2015-05-14 (×2): qty 0.5

## 2015-05-14 MED ORDER — DEXTROSE-NACL 5-0.45 % IV SOLN
INTRAVENOUS | Status: DC
  Administered 2015-05-14 – 2015-05-15 (×2): via INTRAVENOUS

## 2015-05-14 MED ORDER — SODIUM CHLORIDE 0.9 % IV BOLUS (SEPSIS)
1000.0000 mL | Freq: Once | INTRAVENOUS | Status: AC
  Administered 2015-05-14: 1000 mL via INTRAVENOUS

## 2015-05-14 MED ORDER — VANCOMYCIN HCL IN DEXTROSE 1-5 GM/200ML-% IV SOLN
1000.0000 mg | Freq: Three times a day (TID) | INTRAVENOUS | Status: DC
  Administered 2015-05-14 – 2015-05-15 (×2): 1000 mg via INTRAVENOUS
  Filled 2015-05-14 (×2): qty 200

## 2015-05-14 MED ORDER — FOLIC ACID 1 MG PO TABS
1.0000 mg | ORAL_TABLET | Freq: Every day | ORAL | Status: DC
  Administered 2015-05-14 – 2015-05-18 (×5): 1 mg via ORAL
  Filled 2015-05-14 (×5): qty 1

## 2015-05-14 MED ORDER — LORAZEPAM 2 MG/ML IJ SOLN
2.0000 mg | INTRAMUSCULAR | Status: DC | PRN
  Administered 2015-05-15: 2 mg via INTRAVENOUS
  Filled 2015-05-14: qty 1

## 2015-05-14 MED ORDER — PIPERACILLIN-TAZOBACTAM 3.375 G IVPB 30 MIN
3.3750 g | Freq: Once | INTRAVENOUS | Status: AC
  Administered 2015-05-14: 3.375 g via INTRAVENOUS
  Filled 2015-05-14: qty 50

## 2015-05-14 MED ORDER — PIPERACILLIN-TAZOBACTAM 3.375 G IVPB
3.3750 g | Freq: Three times a day (TID) | INTRAVENOUS | Status: DC
  Administered 2015-05-14 – 2015-05-16 (×5): 3.375 g via INTRAVENOUS
  Filled 2015-05-14 (×5): qty 50

## 2015-05-14 MED ORDER — VANCOMYCIN HCL IN DEXTROSE 1-5 GM/200ML-% IV SOLN
1000.0000 mg | Freq: Once | INTRAVENOUS | Status: AC
  Administered 2015-05-14: 1000 mg via INTRAVENOUS
  Filled 2015-05-14: qty 200

## 2015-05-14 MED ORDER — VITAMIN B-1 100 MG PO TABS
100.0000 mg | ORAL_TABLET | Freq: Every day | ORAL | Status: DC
  Administered 2015-05-14 – 2015-05-18 (×5): 100 mg via ORAL
  Filled 2015-05-14 (×5): qty 1

## 2015-05-14 MED ORDER — ALBUTEROL SULFATE (2.5 MG/3ML) 0.083% IN NEBU: 2.5000 mg | INHALATION_SOLUTION | RESPIRATORY_TRACT | Status: DC | PRN

## 2015-05-14 MED ORDER — ACETAMINOPHEN 325 MG PO TABS
650.0000 mg | ORAL_TABLET | Freq: Four times a day (QID) | ORAL | Status: DC | PRN
  Administered 2015-05-14 – 2015-05-17 (×4): 650 mg via ORAL
  Filled 2015-05-14 (×4): qty 2

## 2015-05-14 MED ORDER — ENOXAPARIN SODIUM 40 MG/0.4ML ~~LOC~~ SOLN
40.0000 mg | SUBCUTANEOUS | Status: DC
  Administered 2015-05-14 – 2015-05-18 (×5): 40 mg via SUBCUTANEOUS
  Filled 2015-05-14 (×6): qty 0.4

## 2015-05-14 MED ORDER — ADULT MULTIVITAMIN W/MINERALS CH
1.0000 | ORAL_TABLET | Freq: Every day | ORAL | Status: DC
  Administered 2015-05-14 – 2015-05-18 (×5): 1 via ORAL
  Filled 2015-05-14 (×5): qty 1

## 2015-05-14 MED ORDER — FENTANYL CITRATE (PF) 100 MCG/2ML IJ SOLN: 25.0000 ug | Freq: Once | INTRAMUSCULAR | Status: DC

## 2015-05-14 MED ORDER — ONDANSETRON HCL 4 MG/2ML IJ SOLN: 4.0000 mg | Freq: Four times a day (QID) | INTRAMUSCULAR | Status: DC | PRN

## 2015-05-14 MED ORDER — DEXTROSE-NACL 5-0.45 % IV SOLN
INTRAVENOUS | Status: DC
Start: 2015-05-14 — End: 2015-05-16
  Administered 2015-05-14: 12:00:00 via INTRAVENOUS
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
## 2015-05-14 NOTE — ED Notes (Signed)
Patient states his father is Janalyn Rouse.  Patient provided the following number for him:  [phone].  No answer x2, left msg.

## 2015-05-14 NOTE — Progress Notes (Signed)
CRITICAL VALUE ALERT  Critical value received:  Lactic acid 3.1  Date of notification:  05/14/15  Time of notification:  13:45  Critical value read back:Yes.    Nurse who received alert:  Juel Burrow, RN  MD notified (1st page):  Dr. Doyle Askew  Time of first page:  14:00  MD notified (2nd page):  Time of second page:  Responding MD:  Dr. Doyle Askew  Time MD responded:
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
## 2015-05-14 NOTE — ED Notes (Signed)
Bed: WA09 Expected date: 05/14/15 Expected time: 8:18 AM Means of arrival: Ambulance Comments: Hypothermia

## 2015-05-14 NOTE — Progress Notes (Signed)
Patient complains of extreme pain in left knee with movement - he says this was not present PTA. MD notified.

## 2015-05-15 ENCOUNTER — Inpatient Hospital Stay (HOSPITAL_COMMUNITY): Payer: Self-pay

## 2015-05-15 DIAGNOSIS — F14129 Cocaine abuse with intoxication, unspecified: Secondary | ICD-10-CM

## 2015-05-15 DIAGNOSIS — E872 Acidosis, unspecified: Secondary | ICD-10-CM | POA: Diagnosis present

## 2015-05-15 DIAGNOSIS — T68XXXS Hypothermia, sequela: Secondary | ICD-10-CM

## 2015-05-15 DIAGNOSIS — F10229 Alcohol dependence with intoxication, unspecified: Secondary | ICD-10-CM

## 2015-05-15 DIAGNOSIS — E162 Hypoglycemia, unspecified: Secondary | ICD-10-CM

## 2015-05-15 DIAGNOSIS — F1999 Other psychoactive substance use, unspecified with unspecified psychoactive substance-induced disorder: Secondary | ICD-10-CM

## 2015-05-15 DIAGNOSIS — R918 Other nonspecific abnormal finding of lung field: Secondary | ICD-10-CM | POA: Diagnosis present

## 2015-05-15 DIAGNOSIS — M25562 Pain in left knee: Secondary | ICD-10-CM | POA: Diagnosis present

## 2015-05-15 DIAGNOSIS — F10220 Alcohol dependence with intoxication, uncomplicated: Secondary | ICD-10-CM

## 2015-05-15 DIAGNOSIS — A419 Sepsis, unspecified organism: Secondary | ICD-10-CM

## 2015-05-15 DIAGNOSIS — T7589XS Other specified effects of external causes, sequela: Secondary | ICD-10-CM

## 2015-05-15 DIAGNOSIS — R609 Edema, unspecified: Secondary | ICD-10-CM

## 2015-05-15 DIAGNOSIS — Z590 Homelessness: Secondary | ICD-10-CM

## 2015-05-15 DIAGNOSIS — G9340 Encephalopathy, unspecified: Secondary | ICD-10-CM

## 2015-05-15 LAB — COMPREHENSIVE METABOLIC PANEL
ALBUMIN: 3.2 g/dL — AB (ref 3.5–5.0)
ALK PHOS: 55 U/L (ref 38–126)
ALT: 9 U/L — ABNORMAL LOW (ref 17–63)
AST: 27 U/L (ref 15–41)
Anion gap: 5 (ref 5–15)
BILIRUBIN TOTAL: 0.6 mg/dL (ref 0.3–1.2)
BUN: 18 mg/dL (ref 6–20)
CALCIUM: 8.5 mg/dL — AB (ref 8.9–10.3)
CO2: 28 mmol/L (ref 22–32)
CREATININE: 1.42 mg/dL — AB (ref 0.61–1.24)
Chloride: 105 mmol/L (ref 101–111)
GFR calc Af Amer: >60 mL/min (ref >60)
GFR, EST NON AFRICAN AMERICAN: 60 mL/min — AB (ref >60)
GLUCOSE: 109 mg/dL — AB (ref 65–99)
POTASSIUM: 4.1 mmol/L (ref 3.5–5.1)
Sodium: 138 mmol/L (ref 135–145)
TOTAL PROTEIN: 6.1 g/dL — AB (ref 6.5–8.1)

## 2015-05-15 LAB — CBC
HCT: 39 % (ref 39.0–52.0)
Hemoglobin: 12.9 g/dL — ABNORMAL LOW (ref 13.0–17.0)
MCH: 31.2 pg (ref 26.0–34.0)
MCHC: 33.1 g/dL (ref 30.0–36.0)
MCV: 94.4 fL (ref 78.0–100.0)
PLATELETS: 259 10*3/uL (ref 150–400)
RBC: 4.13 MIL/uL — ABNORMAL LOW (ref 4.22–5.81)
RDW: 14.2 % (ref 11.5–15.5)
WBC: 7.3 10*3/uL (ref 4.0–10.5)

## 2015-05-15 LAB — GLUCOSE, CAPILLARY
GLUCOSE-CAPILLARY: 101 mg/dL — AB (ref 65–99)
Glucose-Capillary: 101 mg/dL — ABNORMAL HIGH (ref 65–99)
Glucose-Capillary: 98 mg/dL (ref 65–99)

## 2015-05-15 LAB — ETHANOL

## 2015-05-15 MED ORDER — VANCOMYCIN HCL IN DEXTROSE 1-5 GM/200ML-% IV SOLN
1000.0000 mg | Freq: Two times a day (BID) | INTRAVENOUS | Status: DC
  Administered 2015-05-15 – 2015-05-16 (×2): 1000 mg via INTRAVENOUS
  Filled 2015-05-15 (×2): qty 200

## 2015-05-15 NOTE — Progress Notes (Signed)
*  PRELIMINARY RESULTS* Vascular Ultrasound Left lower extremity venous duplex has been completed.  Preliminary findings: negative for DVT    Landry Mellow, RDMS, RVT  05/15/2015, 9:33 AM

## 2015-05-15 NOTE — Progress Notes (Addendum)
Patient ID: LECIL TAPP, male   DOB: 03-28-1971, 44 y.o.   MRN: [id number] [facility] PROGRESS NOTE  RUDRA HOBBINS [id number] DOB: 1970-07-29 DOA: 05/14/2015 PCP: Carmie Kanner, NP  Brief narrative:    44 year old male, homeless, history of substance abuse who presented to [facility] ED with altered mental status. On admission, pt was found to have alcohol level of 224, UDS was positive for cocaine. His was hypothermic in ED and sepsis criteria met on admission for which reason he was started on broad spectrum abx although no obvious source of infection identified at this point.   Assessment/Plan:     Principal Problem: Acute drug induced encephalopathy / Alcohol intoxication in active alcoholic ([place]) / Cocaine abuse with intoxication ([place]) - Likely multifactorial due to cocaine and alcohol intoxication - UDS on admission was positive for cocaine and alcohol level was 224 on admission  - Ammonia level ws WNL. Tylenol was WNL. - CT head showed no acute intracranial findings  - Mental status better this am - Continue CIWA protocol  - Continue multivitamin, thiamine and folic acid - Transfer to telemetry today.   Active Problems: Sepsis, unspecified organism ([place]) / Hypothermia / lactic acidosis  - Sepsis criteria met on admission considering hypothermia ( T 65F), elevated lactic acid, leukocytosis. Procalcitonin level was WNL.  - Since he presented with altered mental status aspiration pneumonia is a consideration - He is on zosyn and vancomycin. Will change or stop as soon as blood and urine culture results are back.  - Blood and urine cultures pending.  - WBC count normalized   Hypoglycemia - Due to poor po intake, acute encephalopathy - Pt on D5 NS  - CBG's in past 24 hours: 131, 101, 98  Lung nodules - CT scan identified 10 mm spiculated nodule in RUL and 4 mm LUL nodule - Needs PET CT scan which can be done outpt only - IR consulted to see if biopsy possible    Left leg swelling, pain, left knee pain - Obtain LE venous doppler to rule out DVT - Obtain x ray of the left knee   Homeless - SW consulted  DVT Prophylaxis  - Lovenox subQ ordered   Code Status: Full.  Family Communication:  plan of care discussed with the patient's family at the bedside  Disposition Plan: transfer to telemetry today.   IV access:  Peripheral IV  Procedures and diagnostic studies:    Dg Chest 1 View 05/14/2015   No active disease. Electronically Signed   By: Marin Olp M.D.   On: 05/14/2015 09:27   Ct Head Wo Contrast 05/14/2015 No acute intracranial findings. Electronically Signed   By: Marin Olp M.D.   On: 05/14/2015 08:57   Ct Chest Wo Contrast 05/14/2015 10 mm spiculated nodule in the right upper lobe is worrisome for cancer. PET CT is recommended. 4 mm left upper lobe pulmonary nodule No evidence of injury. Electronically Signed   By: Marybelle Killings M.D.   On: 05/14/2015 13:27   Medical Consultants:  IR    Other Consultants:  Social work    IAnti-Infectives:   Zosyn and vanco 05/14/15 -->    Leisa Lenz, MD  [facility] Pager [phone]  Time spent in minutes: 25 minutes  If 7PM-7AM, please contact night-coverage [email or web address] Password [email or web address] 05/15/2015, 8:48 AM   LOS: 1 day    HPI/Subjective: No acute overnight events. Left knee pain.  Objective: Filed Vitals:   05/15/15 0000 05/15/15 0400  05/15/15 0700 05/15/15 0800  BP: 125/81 133/92 134/93 131/90  Pulse: 83 68 79 75  Temp:  98.3 F (36.8 C)    TempSrc:  Oral    Resp: _0 Height:      Weight:      SpO2: 100% 100% 97% 100%    Intake/Output Summary (Last 24 hours) at 05/15/15 0848 Last data filed at 05/15/15 0800  Gross per 24 hour  Intake 3231.58 ml  Output   1150 ml  Net 2081.58 ml    Exam:   General:  Pt is slightly drowsy but able to answer questions, follows simple commands  Cardiovascular: Regular rate and rhythm, S1/S2  (+)  Respiratory: Clear to auscultation bilaterally, no wheezing, no crackles, no rhonchi  Abdomen: Soft, non tender, non distended, bowel sounds present  Extremities: left leg more swollen than right, pulses DP and PT palpable bilaterally  Neuro: Grossly nonfocal  Data Reviewed: Basic Metabolic Panel:  Recent Labs Lab 05/14/15 0905 05/14/15 1226 05/15/15 0400  NA 142 141 138  K 3.9 3.6 4.1  CL 108 109 105  CO2 _1 GLUCOSE 84 108* 109*  BUN _2 CREATININE 1.15 1.10 1.42*  CALCIUM 9.2 8.4* 8.5*  MG  --  2.0  --   PHOS  --  3.7  --    Liver Function Tests:  Recent Labs Lab 05/14/15 0905 05/14/15 1226 05/15/15 0400  AST 34 31 27  ALT 11* 11* 9*  ALKPHOS 63 57 55  BILITOT 0.6 0.4 0.6  PROT 7.5 6.8 6.1*  ALBUMIN 4.2 3.6 3.2*    Recent Labs Lab 05/14/15 1226  LIPASE 28    Recent Labs Lab 05/14/15 1226  AMMONIA 26   CBC:  Recent Labs Lab 05/14/15 0905 05/14/15 1226 05/15/15 0400  WBC 11.9* 8.9 7.3  NEUTROABS 7.4  --   --   HGB 15.5 14.4 12.9*  HCT 46.1 43.5 39.0  MCV 93.5 95.8 94.4  PLT 310 272 259   Cardiac Enzymes: No results for input(s): CKTOTAL, CKMB, CKMBINDEX, TROPONINI in the last 168 hours. BNP: Invalid input(s): POCBNP CBG:  Recent Labs Lab 05/14/15 1102 05/14/15 1224 05/14/15 1647 05/14/15 2031 05/15/15 0023  GLUCAP 79 91 99 131* 101*    Recent Results (from the past 240 hour(s))  MRSA PCR Screening     Status: None   Collection Time: 05/14/15 11:34 AM  Result Value Ref Range Status   MRSA by PCR NEGATIVE NEGATIVE Final     Scheduled Meds: . enoxaparin (LOVENOX) injection  40 mg Subcutaneous Q24H  . folic acid  1 mg Oral Daily  . multivitamin with minerals  1 tablet Oral Daily  . piperacillin-tazobactam (ZOSYN)  IV  3.375 g Intravenous Q8H  . thiamine  100 mg Oral Daily  . vancomycin  1,000 mg Intravenous Q12H   Continuous Infusions: . dextrose 5 % and 0.45% NaCl 75 mL/hr at 05/15/15 0832  . dextrose  5 % and 0.45% NaCl 75 mL/hr at 05/14/15 1151
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
## 2015-05-16 DIAGNOSIS — R918 Other nonspecific abnormal finding of lung field: Secondary | ICD-10-CM

## 2015-05-16 LAB — BASIC METABOLIC PANEL
ANION GAP: 5 (ref 5–15)
Anion gap: 6 (ref 5–15)
BUN: 14 mg/dL (ref 6–20)
BUN: 13 mg/dL (ref 6–20)
CALCIUM: 8.8 mg/dL — AB (ref 8.9–10.3)
CHLORIDE: 107 mmol/L (ref 101–111)
CHLORIDE: 107 mmol/L (ref 101–111)
CO2: 27 mmol/L (ref 22–32)
CO2: 26 mmol/L (ref 22–32)
CREATININE: 1.25 mg/dL — AB (ref 0.61–1.24)
CREATININE: 1.29 mg/dL — AB (ref 0.61–1.24)
Calcium: 9 mg/dL (ref 8.9–10.3)
GFR calc non Af Amer: >60 mL/min (ref >60)
GFR calc non Af Amer: >60 mL/min (ref >60)
Glucose, Bld: 114 mg/dL — ABNORMAL HIGH (ref 65–99)
Glucose, Bld: 104 mg/dL — ABNORMAL HIGH (ref 65–99)
POTASSIUM: 4.3 mmol/L (ref 3.5–5.1)
Potassium: 4.2 mmol/L (ref 3.5–5.1)
SODIUM: 140 mmol/L (ref 135–145)
SODIUM: 138 mmol/L (ref 135–145)

## 2015-05-16 LAB — URINE CULTURE: Culture: NO GROWTH

## 2015-05-16 LAB — CBC
HCT: 39.3 % (ref 39.0–52.0)
Hemoglobin: 13 g/dL (ref 13.0–17.0)
MCH: 31.3 pg (ref 26.0–34.0)
MCHC: 33.1 g/dL (ref 30.0–36.0)
MCV: 94.7 fL (ref 78.0–100.0)
PLATELETS: 269 10*3/uL (ref 150–400)
RBC: 4.15 MIL/uL — AB (ref 4.22–5.81)
RDW: 13.9 % (ref 11.5–15.5)
WBC: 7.2 10*3/uL (ref 4.0–10.5)

## 2015-05-16 LAB — VANCOMYCIN, TROUGH: VANCOMYCIN TR: 13 ug/mL (ref 10.0–20.0)

## 2015-05-16 MED ORDER — ENSURE ENLIVE PO LIQD
237.0000 mL | Freq: Two times a day (BID) | ORAL | Status: DC
  Administered 2015-05-16 – 2015-05-18 (×3): 237 mL via ORAL

## 2015-05-16 MED ORDER — PNEUMOCOCCAL VAC POLYVALENT 25 MCG/0.5ML IJ INJ
0.5000 mL | INJECTION | INTRAMUSCULAR | Status: AC
  Administered 2015-05-17: 0.5 mL via INTRAMUSCULAR
  Filled 2015-05-16 (×2): qty 0.5
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
## 2015-05-16 NOTE — Progress Notes (Addendum)
Patient ID: Craig Schmidt, male   DOB: [birth year], 44 y.o.   MRN: [id number] [facility] PROGRESS NOTE  Craig Schmidt OIT:[id number] DOB: [birth year] DOA: 05/14/2015 PCP: Carmie Kanner, NP  Brief narrative:    44 year old male, homeless, history of substance abuse who presented to [facility] ED with altered mental status. On admission, pt was found to have alcohol level of 224, UDS was positive for cocaine. His was hypothermic in ED and sepsis criteria met on admission for which reason he was started on broad spectrum abx although no obvious source of infection identified at this point.   Due to complaints of left knee pain we obtained x ray which showed remote MCL injury. Doppler of LLE was negative for DVT.  Anticipated discharge: Likely by 10/25.  Assessment/Plan:     Principal Problem: Acute drug induced encephalopathy / Alcohol intoxication in active alcoholic ([place]) / Cocaine abuse with intoxication ([place]) - Secondary to combination of cocaine and alcohol intoxication - CT head showed no acute findings  - UDS on admission was positive for cocaine and alcohol level was 224 on admission  - Ammonia level ws WNL. Tylenol was WNL. - Mental status much better this am  - We will continue CIWA protocol for next 24 hours - Continue multivitamin, thiamine and folic acid - Stop IV fluids and continue regular diet   Active Problems: Sepsis, unspecified organism ([place]) / Hypothermia / lactic acidosis  - Sepsis criteria met on admission considering hypothermia ( T - 90 F), elevated lactic acid, leukocytosis. Procalcitonin level was WNL.  - No obvious source of infection - Vanco and zosyn stopped today  - Blood and urine cultures so far no growth  - WBC count WNL  Hypoglycemia - Likely due to altered mental status, alcohol intoxication - Tolerates regular diet   Lung nodules - CT scan identified 10 mm spiculated nodule in RUL and 4 mm LUL nodule - Needs PET CT scan which can be done  outpt only - We will see with pulmonary if bx can be done during this hospitalization   Left leg swelling, pain, left knee pain - LE venous doppler negative for DVT - Xray of the left knee - remote MCL injury   Homeless - SW consulted, appreciate their assistance   DVT Prophylaxis  - Lovenox subQ   Code Status: Full.  Family Communication:  plan of care discussed with the patient's family at the bedside  Disposition Plan: D/C 10/25   IV access:  Peripheral IV  Procedures and diagnostic studies:    Dg Chest 1 View 05/14/2015   No active disease. Electronically Signed   By: Marin Olp M.D.   On: 05/14/2015 09:27   Ct Head Wo Contrast 05/14/2015 No acute intracranial findings. Electronically Signed   By: Marin Olp M.D.   On: 05/14/2015 08:57   Ct Chest Wo Contrast 05/14/2015 10 mm spiculated nodule in the right upper lobe is worrisome for cancer. PET CT is recommended. 4 mm left upper lobe pulmonary nodule No evidence of injury. Electronically Signed   By: Marybelle Killings M.D.   On: 05/14/2015 13:27   Medical Consultants:  IR    Other Consultants:  Social work    IAnti-Infectives:   Zosyn and vanco 05/14/15 --> 05/16/2015    Leisa Lenz, MD  [facility] Pager [phone]  Time spent in minutes: 25 minutes  If 7PM-7AM, please contact night-coverage [email or web address] Password [email or web address] 05/16/2015, 10:19 AM   LOS: 2 days  HPI/Subjective: No acute overnight events. Better mental status.  Objective: Filed Vitals:   05/15/15 0900 05/15/15 1515 05/15/15 2121 05/16/15 0530  BP: 132/80 133/80 127/81 124/84  Pulse: 82 91 91 71  Temp:  98.1 F (36.7 C) 98.3 F (36.8 C) 98.2 F (36.8 C)  TempSrc:  Oral Oral Oral  Resp: _0 Height:      Weight:      SpO2: 100% 99% 100% 100%    Intake/Output Summary (Last 24 hours) at 05/16/15 1019 Last data filed at 05/16/15 0940  Gross per 24 hour  Intake    425 ml  Output   1050 ml  Net   -625 ml     Exam:   General:  Pt is not in distress, alert and awake   Cardiovascular: RRR, appreciate S1/S2  Respiratory: No wheezing, no crackles, no rhonchi  Abdomen: (+) BS, non tender   Extremities: pain in left knee, leg, swelling less so prominent   Neuro: Nonfocal  Data Reviewed: Basic Metabolic Panel:  Recent Labs Lab 05/14/15 0905 05/14/15 1226 05/15/15 0400 05/16/15 0510  NA 142 141 138 140  K 3.9 3.6 4.1 4.2  CL 108 109 105 107  CO2 _1 GLUCOSE 84 108* 109* 114*  BUN _2 CREATININE 1.15 1.10 1.42* 1.25*  CALCIUM 9.2 8.4* 8.5* 8.8*  MG  --  2.0  --   --   PHOS  --  3.7  --   --    Liver Function Tests:  Recent Labs Lab 05/14/15 0905 05/14/15 1226 05/15/15 0400  AST 34 31 27  ALT 11* 11* 9*  ALKPHOS 63 57 55  BILITOT 0.6 0.4 0.6  PROT 7.5 6.8 6.1*  ALBUMIN 4.2 3.6 3.2*    Recent Labs Lab 05/14/15 1226  LIPASE 28    Recent Labs Lab 05/14/15 1226  AMMONIA 26   CBC:  Recent Labs Lab 05/14/15 0905 05/14/15 1226 05/15/15 0400  WBC 11.9* 8.9 7.3  NEUTROABS 7.4  --   --   HGB 15.5 14.4 12.9*  HCT 46.1 43.5 39.0  MCV 93.5 95.8 94.4  PLT 310 272 259   Cardiac Enzymes: No results for input(s): CKTOTAL, CKMB, CKMBINDEX, TROPONINI in the last 168 hours. BNP: Invalid input(s): POCBNP CBG:  Recent Labs Lab 05/14/15 1647 05/14/15 2031 05/15/15 0023 05/15/15 0451 05/15/15 0835  GLUCAP 99 131* 101* 101* 98    Recent Results (from the past 240 hour(s))  MRSA PCR Screening     Status: None   Collection Time: 05/14/15 11:34 AM  Result Value Ref Range Status   MRSA by PCR NEGATIVE NEGATIVE Final     Scheduled Meds: . enoxaparin (LOVENOX) injection  40 mg Subcutaneous Q24H  . folic acid  1 mg Oral Daily  . multivitamin with minerals  1 tablet Oral Daily  . piperacillin-tazobactam (ZOSYN)  IV  3.375 g Intravenous Q8H  . thiamine  100 mg Oral Daily  . vancomycin  1,000 mg Intravenous Q12H   Continuous  Infusions: . dextrose 5 % and 0.45% NaCl 75 mL/hr at 05/15/15 1142  . dextrose 5 % and 0.45% NaCl 75 mL/hr at 05/14/15 1151

## 2015-05-17 ENCOUNTER — Inpatient Hospital Stay (HOSPITAL_COMMUNITY): Payer: No Typology Code available for payment source

## 2015-05-17 ENCOUNTER — Inpatient Hospital Stay (HOSPITAL_COMMUNITY): Payer: Self-pay

## 2015-05-17 DIAGNOSIS — E43 Unspecified severe protein-calorie malnutrition: Secondary | ICD-10-CM

## 2015-05-17 DIAGNOSIS — T68XXXD Hypothermia, subsequent encounter: Secondary | ICD-10-CM

## 2015-05-17 MED ORDER — FOLIC ACID 1 MG PO TABS: 1.0000 mg | ORAL_TABLET | Freq: Every day | ORAL | Status: DC

## 2015-05-17 MED ORDER — ACETAMINOPHEN 325 MG PO TABS: 650.0000 mg | ORAL_TABLET | Freq: Four times a day (QID) | ORAL | Status: DC | PRN

## 2015-05-17 MED ORDER — ADULT MULTIVITAMIN W/MINERALS CH: 1.0000 | ORAL_TABLET | Freq: Every day | ORAL | Status: DC

## 2015-05-17 MED ORDER — THIAMINE HCL 100 MG PO TABS: 100.0000 mg | ORAL_TABLET | Freq: Every day | ORAL | Status: DC

## 2015-05-17 MED ORDER — GADOBENATE DIMEGLUMINE 529 MG/ML IV SOLN
15.0000 mL | Freq: Once | INTRAVENOUS | Status: AC | PRN
  Administered 2015-05-17: 15 mL via INTRAVENOUS

## 2015-05-17 MED ORDER — VITAMINS A & D EX OINT
TOPICAL_OINTMENT | CUTANEOUS | Status: AC
  Administered 2015-05-18
  Filled 2015-05-17: qty 5

## 2015-05-17 MED ORDER — LORAZEPAM 2 MG/ML IJ SOLN
1.0000 mg | Freq: Once | INTRAMUSCULAR | Status: AC
  Administered 2015-05-17: 2 mg via INTRAVENOUS
  Filled 2015-05-17: qty 1

## 2015-05-17 MED ORDER — ENSURE ENLIVE PO LIQD: 237.0000 mL | Freq: Two times a day (BID) | ORAL | Status: DC
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
## 2015-05-17 NOTE — Care Management Note (Signed)
Case Management Note  Patient Details  Name: Craig Schmidt MRN: [id number] Date of Birth: 11/01/1970  Subjective/Objective:   Acute drug induced encephalopathy / Alcohol intoxication in active alcoholic ([name]) / Cocaine abuse with intoxication [facility]                Action/Plan: Discharge planning, spoke with patient at bedside. Patient just worked with PT, does not think he will need HH but does think he will need RW and shower chair. Patient plans on d/cing to sisters house so she can assist with care. Discussed need for PCP, provided patient with resources to obtain a PCP. Patient states he has insurance coverage with [employer]. He is to have his sister bring card to hospital for admissions, currently patient is listed as self pay. Will continue to follow for d/c needs.   Expected Discharge Date:                  Expected Discharge Plan:  Home/Self Care  In-House Referral:  PCP / [facility]  Discharge planning Services  CM Consult  Post Acute Care Choice:  NA Choice offered to:  NA  DME Arranged:  N/A DME Agency:  NA  HH Arranged:  NA HH Agency:  NA  Status of Service:  In process, will continue to follow  Medicare Important Message Given:    Date Medicare IM Given:    Medicare IM give by:    Date Additional Medicare IM Given:    Additional Medicare Important Message give by:     If discussed at [place] of Stay Meetings, dates discussed:    Additional Comments:  Guadalupe Maple, RN 05/17/2015, 3:25 PM

## 2015-05-17 NOTE — Progress Notes (Addendum)
Patient ID: Craig Schmidt, male   DOB: 1971-03-19, 44 y.o.   MRN: [id number] [facility] PROGRESS NOTE  BION TODOROV GYK:[id number] DOB: [birth year] DOA: 05/14/2015 PCP: Carmie Kanner, NP  Brief narrative:    44 year old male, homeless, history of substance abuse who presented to [facility] ED with altered mental status. On admission, pt was found to have alcohol level of 224, UDS was positive for cocaine. His was hypothermic in ED and sepsis criteria met on admission for which reason he was started on broad spectrum abx although no obvious source of infection identified at this point.   Due to complaints of left knee pain we obtained x ray which showed remote MCL injury. Doppler of LLE was negative for DVT. MRI of left knee is pending.  Anticipated discharge: Likely by 10/26.  Assessment/Plan:     Principal Problem: Acute drug induced encephalopathy / Alcohol intoxication in active alcoholic ([place]) / Cocaine abuse with intoxication ([place]) - Secondary to cocaine and alcohol intoxication - CT head showed no acute findings  - UDS on admission positive for cocaine and alcohol level was 224 on admission  - Ammonia WNL. Tylenol WNL. - Mental status good today - Stop CIWA protocol - Continue multivitamin, thiamine and folic acid  Active Problems: Sepsis- ruled out / Hypothermia / lactic acidosis  - Sepsis criteria met on admission (hypothermia T - 90 F, elevated lactic acid, leukocytosis. Procalcitonin level was WNL) but no obvious source of infection so sepsis diagnosis ruled out - Vanco and zosyn stopped 10/24. - Blood and urine cultures so far showed no growth  - WBC count normalized   Hypoglycemia - Likely due to alcohol intoxication - Tolerates regular diet  - CBG's in past 24 hours: 101. 101. 98  Headache - Obtain MRI brain for further evaluation   Lung nodules - CT scan identified 10 mm spiculated nodule in RUL and 4 mm LUL nodule - Per pulmonary they will sch appt in  their clinic for follow CT scan, no need to biopsy at this point   Left leg swelling, pain, left knee pain - LE venous doppler negative for DVT - Xray of the left knee - remote MCL injury  - Spoke with ortho Dr. Marlou Sa who recommended MRI left knee for further evaluation    DVT Prophylaxis  - Lovenox subQ in hospital   Code Status: Full.  Family Communication:  plan of care discussed with the patient's family (sister, mother) at the bedside  Disposition Plan: D/C 10/26  IV access:  Peripheral IV  Procedures and diagnostic studies:    Dg Chest 1 View 05/14/2015   No active disease. Electronically Signed   By: Marin Olp M.D.   On: 05/14/2015 09:27   Ct Head Wo Contrast 05/14/2015 No acute intracranial findings. Electronically Signed   By: Marin Olp M.D.   On: 05/14/2015 08:57   Ct Chest Wo Contrast 05/14/2015 10 mm spiculated nodule in the right upper lobe is worrisome for cancer. PET CT is recommended. 4 mm left upper lobe pulmonary nodule No evidence of injury. Electronically Signed   By: Marybelle Killings M.D.   On: 05/14/2015 13:27   Medical Consultants:  IR   Pulmonary - phone call only Orthopedic surgery - phone call only to Dr. Marlou Sa  Other Consultants:  Social work   Physical therapy   IAnti-Infectives:   Zosyn and vanco 05/14/15 --> 05/16/2015    Leisa Lenz, MD  [facility] Pager [phone]  Time  spent in minutes: 25 minutes  If 7PM-7AM, please contact night-coverage [email or web address] Password [email or web address] 05/17/2015, 11:01 AM   LOS: 3 days    HPI/Subjective: No acute overnight events. Still with knee pain and headache.  Objective: Filed Vitals:   05/16/15 0530 05/16/15 1634 05/16/15 2118 05/17/15 0504  BP: 124/84 137/87 136/84 128/85  Pulse: 71 83 79 70  Temp: 98.2 F (36.8 C) 97.9 F (36.6 C) 97.4 F (36.3 C) 97.6 F (36.4 C)  TempSrc: Oral Oral Oral Oral  Resp: 16 16 18 20   Height:      Weight:      SpO2: 100% 100% 100% 100%     Intake/Output Summary (Last 24 hours) at 05/17/15 1101 Last data filed at 05/17/15 0504  Gross per 24 hour  Intake    240 ml  Output   1475 ml  Net  -1235 ml    Exam:   General:  Pt is alert and awake, no distress  Cardiovascular: Rate controlled, appreciate S1, S2   Respiratory: bilateral air entry, no wheezing   Abdomen: non tender, non distended abd, (+) BS  Extremities: pain in left knee, leg, palpable pulses   Neuro: No focal deficits   Data Reviewed: Basic Metabolic Panel:  Recent Labs Lab 05/14/15 0905 05/14/15 1226 05/15/15 0400 05/16/15 0510 05/16/15 1035  NA 142 141 138 140 138  K 3.9 3.6 4.1 4.2 4.3  CL 108 109 105 107 107  CO2 24 25 28 27 26   GLUCOSE 84 108* 109* 114* 104*  BUN 12 11 18 14 13   CREATININE 1.15 1.10 1.42* 1.25* 1.29*  CALCIUM 9.2 8.4* 8.5* 8.8* 9.0  MG  --  2.0  --   --   --   PHOS  --  3.7  --   --   --    Liver Function Tests:  Recent Labs Lab 05/14/15 0905 05/14/15 1226 05/15/15 0400  AST 34 31 27  ALT 11* 11* 9*  ALKPHOS 63 57 55  BILITOT 0.6 0.4 0.6  PROT 7.5 6.8 6.1*  ALBUMIN 4.2 3.6 3.2*    Recent Labs Lab 05/14/15 1226  LIPASE 28    Recent Labs Lab 05/14/15 1226  AMMONIA 26   CBC:  Recent Labs Lab 05/14/15 0905 05/14/15 1226 05/15/15 0400 05/16/15 1035  WBC 11.9* 8.9 7.3 7.2  NEUTROABS 7.4  --   --   --   HGB 15.5 14.4 12.9* 13.0  HCT 46.1 43.5 39.0 39.3  MCV 93.5 95.8 94.4 94.7  PLT 310 272 259 269   Cardiac Enzymes: No results for input(s): CKTOTAL, CKMB, CKMBINDEX, TROPONINI in the last 168 hours. BNP: Invalid input(s): POCBNP CBG:  Recent Labs Lab 05/14/15 1647 05/14/15 2031 05/15/15 0023 05/15/15 0451 05/15/15 0835  GLUCAP 99 131* 101* 101* 98    Recent Results (from the past 240 hour(s))  MRSA PCR Screening     Status: None   Collection Time: 05/14/15 11:34 AM  Result Value Ref Range Status   MRSA by PCR NEGATIVE NEGATIVE Final     Scheduled Meds: . enoxaparin  (LOVENOX) injection  40 mg Subcutaneous Q24H  . folic acid  1 mg Oral Daily  . multivitamin with minerals  1 tablet Oral Daily  . piperacillin-tazobactam (ZOSYN)  IV  3.375 g Intravenous Q8H  . thiamine  100 mg Oral Daily  . vancomycin  1,000 mg Intravenous Q12H   Continuous Infusions:
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
## 2015-05-17 NOTE — Discharge Instructions (Addendum)
Schwannoma A schwannoma, also called a Schwann cell tumor, is a tumor that grows from cells that coat and protect nerves. The most common type of schwannoma grows on the nerve responsible for hearing, the acoustic nerve. It usually affects only one ear but occasionally affects both. This type of schwannoma is often called an acoustic neuroma. Schwannomas can also affect the nerve roots that come off the spinal cord. Most people who develop schwannomas are older than 50 years. Schwannomas are usually benign, which means they are not cancerous, and do not spread to other areas. In very rare cases, a schwannoma may become malignant (cancerous). RISK FACTORS People who have had radiation exposure in the past may have an increased risk of developing schwannoma. People who have neurofibromatosis type 2 may also have an increased risk of schwannoma. SYMPTOMS  Symptoms of schwannoma usually begin very slowly. The symptoms may depend on the size and location of the tumor. Possible symptoms include:   Hearing loss.  Ringing in your ears (tinnitus).  Unsteadiness or difficulty with balance.   Numbness or tingling of your face.  Facial paralysis.   Difficulty swallowing.   Hoarseness.   Difficulty speaking.  DIAGNOSIS  Hearing loss can be diagnosed through auditory testing. A schwannoma can usually be seen on brain imaging, such as CT scan or MRI. A sample of the tumor will need to be studied in a laboratory (biopsy) to confirm the diagnosis of schwannoma. TREATMENT  Because schwannoma is so slow growing, treatment can be delayed until the tumor reaches a certain size or problematic symptoms develop. An MRI may be performed about every 6 months to monitor tumor growth. There are several ways that schwannoma is treated:  Surgery to remove as much of the tumor as possible.  High-energy rays (radiation therapy) to help shrink or kill the tumor. HOME CARE INSTRUCTIONS  Take medicines as  directed by your health care provider.   Go to all of your follow-up appointments. SEEK MEDICAL CARE IF:   Any symptoms come back.  You have trouble with hearing, vision, balance, or your sense of smell.  You cannot eat or drink what you need.  You are more weak or tired than usual.   You are losing weight without trying.  SEEK IMMEDIATE MEDICAL CARE IF:  You have new symptoms such as vision problems or difficulty walking.   You have a seizure.   You have trouble breathing.   You have a fever.    This information is not intended to replace advice given to you by your health care provider. Make sure you discuss any questions you have with your health care provider.   Document Released: 07/14/2013 Document Reviewed: 07/14/2013 Elsevier Interactive Patient Education 2016 [employer]  Aspiration Precautions Aspiration is the breathing in (inhalation) of a liquid or object into the lungs. Things that can be inhaled into the lungs include:   Food.  Any type of liquid, such as drinks or saliva.  Stomach contents, such as vomit or stomach acid. When these things go into the lungs, damage can occur and serious complications can result, such as:  Lung infection (pneumonia).  Collection of infected liquid (pus) in the lungs (lung abscess).  Death. CAUSES The cause of aspiration may include:   A lowered level of awareness (consciousness) due to:  Traumatic brain injury or head injury.  Stroke.  Diseases of the nerves, brain, or spinal cord.  Seizures.  A problem with the gag reflex. The gag reflex protects  the body from swallowing things too quickly or things that are too large.  Medical conditions that affect swallowing.  Conditions that affect the food pipe (esophagus).  Acid reflux. This is when stomach acid moves into the esophagus.  Any type of surgery where a medicine to sleep (general anesthetic)or relax (sedative) is given.  Alcohol  abuse.  Illegal drug abuse.  Taking medicine that causes sleepiness, confusion, or weakness.  Aging.  Dental problems.  Having a feeding tube. SIGNS AND SYMPTOMS Symptoms of aspiration may include:   Coughing after swallowing food or liquids.  Difficulty breathing. This may include:  Breathing quickly.  Breathing very slowly.  Loud breathing.  Rumbling sounds from the lungs while breathing.  Coughing up phlegm (sputum) that:  Is yellow, tan, or green.  Has pieces of food in it.  Is bad smelling.  A change in voice so that it sounds scratchy.  A change in skin color. The skin may look red or blue.   Fever.  Watery eyes.  Pain in the chest or back.  A pained look on the face.   A feeling of fullness in the throat or that something is stuck in the throat. DIAGNOSIS Aspiration may be diagnosed by:   Chest X-ray.  Bronchoscopy. This is a surgical procedure in which a thin, flexible tube with a camera is inserted into the nose or mouth to the lungs. The health care provider can then view the lungs.  A swallowing evaluation study to find out:  A person's risk of aspiration.  How difficult it is for a person to swallow.  What types of foods are safe for a person to eat. PREVENTION If you are caring for someone who can eat and drink through his or her mouth:   Have the person sit in an upright position when eating food or drinking fluids, such as:  Sitting up in a chair.  If sitting in a chair is not possible, position the person in bed so he or she is upright.  Remind the person to eat slowly and chew well.  Do not distract the person. This is especially important for people with thinking or memory (cognitive) problems.  Check the person's mouth for leftover food after eating.  Keep the person sitting upright for 30-45 minutes after eating.  Do not serve food or drink for at least 2 hours before bedtime. If you are caring for someone with a  feeding tube who cannot eat or drink through his or her mouth:  Keep the person in an upright position as much as possible.  Do not  lay the person flat if he or she is getting continuous feedings. Turn the feeding pump off if you need to lay the person flat for any reason.  Check feeding tube residuals as directed by your health care provider. Ask your health care provider what residual amount is too high. General guidelines to prevent aspiration in someone you are caring for include:  Feed small amounts of food. Do not force feed.  Food should be thickened as directed by the person's speech pathologist.  Use as little water as possible when brushing the person's teeth or cleaning his or her mouth.  Provide oral care before and after meals.  Never put food or liquids in the mouth of a person who is not fully alert.  Crush pills and put them in soft food such as pudding or ice cream. Some pills should not be crushed. Check with your health  care provider before crushing any medicine. SEEK MEDICAL CARE IF:  The person has a feeding tube and the feeding tube residual amount is too high.  The person has a fever.  The person tries to avoid food, such as refusing to eat or be fed, or is eating less than normal. SEEK IMMEDIATE MEDICAL CARE IF:   The person has trouble breathing or starts to breathe quickly.  The person is breathing very slowly or stops breathing.  The person coughs a lot after eating or drinking.  The person has a long-lasting (chronic) cough.  The person coughs up thick, yellow, or tan sputum. MAKE SURE YOU:   Understand these instructions.  Will watch the person's condition.  Will get help right away if the person is not doing well or gets worse.   This information is not intended to replace advice given to you by your health care provider. Make sure you discuss any questions you have with your health care provider.   Document Released: 08/11/2010 Document  Revised: 07/30/2014 Document Reviewed: 10/14/2013 Elsevier Interactive Patient Education 2016 [employer]  Medial Collateral Knee Ligament Sprain With Phase I Rehab The medial collateral ligament (MCL) of the knee helps hold the knee joint in proper alignment and prevents the bones from shifting out of alignment (displacing) to the inside (medially). Injury to the knee may cause a tear in the MCL ligament (sprain). Sprains may heal without treatment, but this often results in a loose joint. Sprains are classified into three categories. Grade 1 sprains cause pain, but the tendon is not lengthened. Grade 2 sprains include a lengthened ligament, due to the ligament being stretched or partially ruptured. With grade 2 sprains, there is still function, although possibly decreased. Grade 3 sprains involve a complete tear of the tendon or muscle, and function is usually impaired. SYMPTOMS   Pain and tenderness on the inner side of the knee.  A "pop," tearing or pulling sensation at the time of injury.  Bruising (contusion) at the site of injury, within 48 hours of injury.  Knee stiffness.  Limping, often walking with the knee bent. CAUSES  An MCL sprain occurs when a force is placed on the ligament that is greater than it can handle. Common mechanisms of injury include:  Direct hit (trauma) to the outer side of the knee, especially if the foot is planted on the ground.  Forceful pivoting of the body and leg, while the foot is planted on the ground. RISK INCREASES WITH:  Contact sports (football, rugby).  Sports that require pivoting or cutting (soccer).  Poor knee strength and flexibility.  Improper equipment use. PREVENTION  Warm up and stretch properly before activity.  Maintain physical fitness:  Strength, flexibility and endurance.  Cardiovascular fitness.  Wear properly fitted protective equipment (correct length of cleats for surface).  Functional braces may be effective  in preventing injury. PROGNOSIS  MCL tears usually heal without the need for surgery. Sometimes however, surgery is required. RELATED COMPLICATIONS  Frequently recurring symptoms, such as the knee giving way, knee instability or knee swelling.  Injury to other structures in the knee joint:  Meniscal cartilage, resulting in locking and swelling of the knee.  Articular cartilage, resulting in knee arthritis.  Other ligaments of the knee.  Injury to nerves, resulting in numbness of the outer leg, foot or ankle and weakness or paralysis, with inability to raise the ankle or toes.  Knee stiffness. TREATMENT Treatment first involves the use of ice and medicine,  to reduce pain and inflammation. The use of strengthening and stretching exercises may help reduce pain with activity. These exercises may be performed at home, but referral to a therapist is often advised. You may be advised to walk with crutches until you are able to walk without a limp. Your caregiver may provide you with a hinged knee brace to help regain a full range of motion, while also protecting the injured knee. For severe MCL injuries or injuries that involve other ligaments of the knee, surgery is often advised. MEDICATION  Do not take pain medicine for 7 days before surgery.  Only use over-the-counter pain medicine as directed by your caregiver.  Only use prescription pain relievers as directed and only in needed amounts. HEAT AND COLD  Cold treatment (icing) should be applied for 10 to 15 minutes every 2 to 3 hours for inflammation and pain, and immediately after any activity, that aggravates the symptoms. Use ice packs or an ice massage.  Heat treatment may be used before performing stretching and strengthening activities prescribed by your caregiver, physical therapist or athletic trainer. Use a heat pack or warm water soak. SEEK MEDICAL CARE IF:   Symptoms get worse or do not improve in 4 to 6 weeks, despite  treatment.  New, unexplained symptoms develop. EXERCISES  PHASE I EXERCISES  RANGE OF MOTION (ROM) AND STRETCHING EXERCISES-Medial Collateral Knee Ligament Sprain Phase I These are some of the initial exercises that your physician, physical therapist or athletic trainer may have you perform to begin your rehabilitation. When you demonstrate gains in your flexibility and strength, your caregiver may progress you to Phase II exercises. As you perform these exercises, remember:  These initial exercises are intended to be gentle. They will help you restore motion without increasing any swelling.  Completing these exercises allows less painful movement and prepares you for the more aggressive strengthening exercises in Phase II.  An effective stretch should be held for at least 30 seconds.  A stretch should never be painful. You should only feel a gentle lengthening or release in the stretched tissue. RANGE OF MOTION-Knee Flexion, Active  Lie on your back with both knees straight. (If this causes back discomfort, bend your healthy knee, placing your foot flat on the floor.)  Slowly slide your heel back toward your buttocks until you feel a gentle stretch in the front of your knee or thigh.  Hold for __________ seconds. Slowly slide your heel back to the starting position. Repeat __________ times. Complete this exercise __________ times per day. STRETCH-Knee Flexion, Supine  Lie on the floor with your right / left heel and foot lightly touching the wall. (Place both feet on the wall if you do not use a door frame.)  Without using any effort, allow gravity to slide your foot down the wall slowly until you feel a gentle stretch in the front of your right / left knee.  Hold this stretch for __________ seconds. Then return the leg to the starting position, using your health leg for help, if needed. Repeat __________ times. Complete this stretch __________ times per day. RANGE OF MOTION-Knee  Flexion and Extension, Active-Assisted  Sit on the edge of a table or chair with your thighs firmly supported. It may be helpful to place a folded towel under the end of your right / left thigh.  Flexion (bending): Place the ankle of your healthy leg on top of the other ankle. Use your healthy leg to gently bend your right /  left knee until you feel a mild tension across the top of your knee.  Hold for __________ seconds.  Extension (straightening): Switch your ankles so your right / left leg is on top. Use your healthy leg to straighten your right / left knee until you feel a mild tension on the backside of your knee.  Hold for __________ seconds. Repeat __________ times. Complete this exercise __________ times per day. STRETCH-Knee Extension Sitting  Sit with your right / left leg/heel propped on another chair, coffee table, or foot stool.  Allow your leg muscles to relax, letting gravity straighten out your knee.*  You should feel a stretch behind your right / left knee. Hold this position for __________ seconds. Repeat __________ times. Complete this stretch __________ times per day. *Your physician, physical therapist or athletic trainer may instruct you to place a __________ weight on your thigh, just above your kneecap, to deepen the stretch. STRENGTHENING EXERCISES-Medial Collateral Knee Ligament Sprain Phase I These exercises may help you when beginning to rehabilitate your injury. They may resolve your symptoms with or without further involvement from your physician, physical therapist or athletic trainer. While completing these exercises, remember:   In order to return to more demanding activities, you will likely need to progress to more challenging exercises. Your physician, physical therapist or athletic trainer will advance your exercises when your tissues show adequate healing and your muscles demonstrate increased strength.  Muscles can gain both the endurance and the  strength needed for everyday activities through controlled exercises.  Complete these exercises as instructed by your physician, physical therapist or athletic trainer. Increase the resistance and repetitions only as guided by your caregiver. STRENGTH-Quadriceps, Isometrics  Lie on your back with your right / left leg extended and your opposite knee bent.  Gradually tense the muscles in the front of your right / left thigh. You should see either your kneecap slide up toward your hip or an increased dimpling just above the knee. This motion will push the back of the knee down toward the floor, mat or bed on which you are lying.  Hold the muscle as tight as you can without increasing your pain for __________ seconds.  Relax the muscles slowly and completely in between each repetition. Repeat __________ times. Complete this exercise __________ times per day. STRENGTH-Quadriceps, Short Arcs  Lie on your back. Place a __________ inch towel roll under your knee so that the knee slightly bends.  Raise only your lower leg by tightening the muscles in the front of your thigh. Do not allow your thigh to rise.  Hold this position for __________ seconds. Repeat __________ times. Complete this exercise __________ times per day. OPTIONAL ANKLE WEIGHTS: Begin with ____________________, but DO NOT exceed ____________________. Increase in 1 pound/0.5 kilogram increments.  STRENGTH--Quadriceps, Straight Leg Raises Quality counts! Watch for signs that the quadriceps muscle is working, to be sure you are strengthening the correct muscles and not "cheating" by substituting with healthier muscles.  Lay on your back with your right / left leg extended and your opposite knee bent.  Tense the muscles in the front of your right / left thigh. You should see either your knee cap slide up or increased dimpling just above the knee. Your thigh may even shake a bit.  Tighten these muscles even more and raise your leg 4  to 6 inches off the floor. Hold for __________ seconds.  Keeping these muscles tense, lower your leg.  Relax the muscles slowly and completely  in between each repetition. Repeat __________ times. Complete this exercise __________ times per day. STRENGTH-Hamstring, Isometrics  Lie on your back on a firm surface.  Bend your right / left knee approximately __________ degrees.  Dig your heel into the surface as if you are trying to pull it toward your buttocks. Tighten the muscles in the back of your thighs to "dig" as hard as you can, without increasing any pain.  Hold this position for __________ seconds.  Release the tension gradually and allow your muscle to completely relax for __________ seconds in between each exercise. Repeat __________ times. Complete this exercise __________ times per day. STRENGTH-Hamstring, Curls  Lay on your stomach with your legs extended. (If you lay on a bed, your feet may hang over the edge.)  Tighten the muscles in the back of your thigh to bend your right / left knee up to 90 degrees. Keep your hips flat on the bed.  Hold this position for __________ seconds.  Slowly lower your leg back to the starting position. Repeat __________ times. Complete this exercise __________ times per day. OPTIONAL ANKLE WEIGHTS: Begin with ____________________, but DO NOT exceed ____________________. Increase in 1 pound/0.5 kilogram increments.    This information is not intended to replace advice given to you by your health care provider. Make sure you discuss any questions you have with your health care provider.   Document Released: 07/09/2005 Document Revised: 07/30/2014 Document Reviewed: 10/21/2008 [employer] Interactive Patient Education [employer].

## 2015-05-18 DIAGNOSIS — D333 Benign neoplasm of cranial nerves: Secondary | ICD-10-CM
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
## 2015-05-18 NOTE — Progress Notes (Signed)
Pt discharge instructions given to pt. Pt verbalized understanding of discharge instructions. Pt was informed by RN and Case Manager that he will only qualify for a walker and NO wheelchair or shower chair due to his insurance expiring. Pt given walker to take home with pt prior to discharge. Pt spoke to Director, Gaspar Garbe and is aware of not qualifying for any equipment. Pt transported via wheelchair downstairs to discharge area. No questions or concerns from the pt at this time.   Sloan Galentine W Aayra Hornbaker, RN

## 2015-05-18 NOTE — Progress Notes (Signed)
Upon making referral to [facility] for [facility] services and DME, they informed me that patient's insurance terminated on 02/20/15. Spoke with patient again, he contacted broker who sold him his policy and confirmed this is true. Per Medicaid guidelines, he will not qualify for charity for PT services. Contacted [facility], they will provide him with a rolling walker at no cost. Informed patient that other DME would be out of pocket. Patient upset but understood. Notified attending of change in d/c plans.

## 2015-05-18 NOTE — Care Management Note (Signed)
Case Management Note  Patient Details  Name: DAVIDSON PALMIERI MRN: [id number] Date of Birth: August 17, 1970  Subjective/Objective:     Acute drug induced encephalopathy / Alcohol intoxication in active alcoholic ([place]) / Cocaine abuse with intoxication [facility]                Action/Plan: Discharge planning, spoke with patient at bedside. Plan for d/c home with sister today, DME ordered and [facility] contacted to deliver. Patient deferred choice for [facility] services to sister, Alease Frame, contacted her and she chose [facility] for [facility] services. Contacted [facility] for referral.   Expected Discharge Date:                  Expected Discharge Plan:  [place]  In-House Referral:  PCP / [facility]  Discharge planning Services  CM Consult  Post Acute Care Choice:  NA Choice offered to:  NA  DME Arranged:  Walker rolling, Wheelchair manual, Tub bench DME Agency:  [place]:  [facility] [place] Agency:  NA, [place]  Status of Service:  Completed, signed off  Medicare Important Message Given:    Date Medicare IM Given:    Medicare IM give by:    Date Additional Medicare IM Given:    Additional Medicare Important Message give by:     If discussed at [place] of Stay Meetings, dates discussed:    Additional Comments:  Guadalupe Maple, RN 05/18/2015, 10:36 AM

## 2015-05-19 LAB — CULTURE, BLOOD (ROUTINE X 2)
CULTURE: NO GROWTH
CULTURE: NO GROWTH

## 2015-05-20 ENCOUNTER — Ambulatory Visit: Payer: Self-pay | Attending: Family Medicine | Admitting: Family Medicine

## 2015-05-20 ENCOUNTER — Encounter: Payer: Self-pay | Admitting: Family Medicine

## 2015-05-20 VITALS — BP 128/83 | HR 90 | Temp 98.4°F | Resp 16 | Ht 67.0 in | Wt 187.0 lb

## 2015-05-20 DIAGNOSIS — F10220 Alcohol dependence with intoxication, uncomplicated: Secondary | ICD-10-CM

## 2015-05-20 DIAGNOSIS — D333 Benign neoplasm of cranial nerves: Secondary | ICD-10-CM | POA: Insufficient documentation

## 2015-05-20 DIAGNOSIS — F1010 Alcohol abuse, uncomplicated: Secondary | ICD-10-CM | POA: Insufficient documentation

## 2015-05-20 DIAGNOSIS — S83412A Sprain of medial collateral ligament of left knee, initial encounter: Secondary | ICD-10-CM

## 2015-05-20 DIAGNOSIS — R918 Other nonspecific abnormal finding of lung field: Secondary | ICD-10-CM | POA: Insufficient documentation

## 2015-05-20 DIAGNOSIS — S83419A Sprain of medial collateral ligament of unspecified knee, initial encounter: Secondary | ICD-10-CM | POA: Insufficient documentation

## 2015-05-20 DIAGNOSIS — M23204 Derangement of unspecified medial meniscus due to old tear or injury, left knee: Secondary | ICD-10-CM | POA: Insufficient documentation

## 2015-05-20 DIAGNOSIS — F17210 Nicotine dependence, cigarettes, uncomplicated: Secondary | ICD-10-CM | POA: Insufficient documentation

## 2015-05-20 DIAGNOSIS — R220 Localized swelling, mass and lump, head: Secondary | ICD-10-CM | POA: Insufficient documentation

## 2015-05-20 NOTE — Patient Instructions (Signed)

## 2015-05-20 NOTE — Progress Notes (Signed)
 CC: Follow-up from hospitalization (05/14/15-05/18/15)  HPI: Craig Schmidt is a 44 y.o. male with a history of  polysubstance abuse who had presented to  ED with altered mental status.  On admission, he  was found to have alcohol level of 224, UDS was positive for cocaine. He was hypothermic in ED with a temperature of 90F and sepsis criteria met on admission for which he was started on IV vancomycin and Zosyn although no obvious source of infection identified. He was started on CIWA protocol, multivitamin, thiamine, folic acid. Ammonia level and Tylenol levels were normal; he had mild leukocytosis of 11.9, Blood culture, urine cultures were no growth to date and antibiotics were subsequently stopped.. 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 He also had a left knee x-ray and left knee MRI due to complains of knee pain which was in keeping with remote left MCL tear. CT chest revealed bilateral lung nodules. He was subsequently discharged and recommendation was to follow-up with ENT, orthopedics, pulmonary outpatient.  Interval history: He saw orthopedic and was prescribed a left knee brace which he is currently wearing but still complains of pain in his left knee as he is yet to pick up all the prescriptions he was given at discharge. Scheduled to see [facility] on 06/20/15. Patient has No headache, No chest pain, No abdominal pain - No Nausea, No new weakness tingling or numbness, No Cough - SOB.  No Known Allergies Past Medical History  Diagnosis Date  . Asthma    Current Outpatient Prescriptions on File Prior to Visit  Medication Sig Dispense Refill  . acetaminophen (TYLENOL) 325 MG tablet Take 2 tablets (650 mg total) by mouth every 6 (six) hours as needed for mild pain or moderate pain. 30 tablet 0  .  feeding supplement, ENSURE ENLIVE, (ENSURE ENLIVE) LIQD Take 237 mLs by mouth 2 (two) times daily between meals. 237 mL 12  . folic acid (FOLVITE) 1 MG tablet Take 1 tablet (1 mg total) by mouth daily. 30 tablet 0  . Multiple Vitamin (MULTIVITAMIN WITH MINERALS) TABS tablet Take 1 tablet by mouth daily. 30 tablet 0  . thiamine 100 MG tablet Take 1 tablet (100 mg total) by mouth daily. 30 tablet 0   No current facility-administered medications on file prior to visit.   History reviewed. No pertinent family history. Social History   Social History  . Marital Status: Married    Spouse Name: N/A  . Number of Children: N/A  . Years of Education: N/A   Occupational History  . Not on file.   Social History Main Topics  . Smoking status: Current Every Day Smoker -- 1.50 packs/day    Types: Cigarettes  . Smokeless tobacco: Not on file  . Alcohol Use: Yes  . Drug Use: No  . Sexual Activity: Not on file   Other Topics Concern  . Not on file   Social History Narrative    Review of Systems: Constitutional: Negative for fever, chills, diaphoresis, activity change, appetite change and fatigue. HENT: Negative for ear pain, nosebleeds, congestion, facial swelling, rhinorrhea, neck pain, neck stiffness and ear discharge.  Eyes: Negative for pain, discharge, redness, itching and visual disturbance. Respiratory: Negative for cough, choking, chest tightness, shortness of breath, wheezing and stridor.  Cardiovascular: Negative for chest pain, palpitations and leg swelling. Gastrointestinal: Negative for   abdominal distention. Genitourinary: Negative for dysuria, urgency, frequency, hematuria, flank pain, decreased urine volume, difficulty urinating and dyspareunia.  Musculoskeletal: see hpi Neurological: Negative for dizziness, tremors, seizures, syncope, facial asymmetry, speech difficulty, weakness, light-headedness, numbness and headaches.  Hematological: Negative for adenopathy. Does not  bruise/bleed easily. Psychiatric/Behavioral: Negative for hallucinations, behavioral problems, confusion, dysphoric mood, decreased concentration and agitation.    Objective:   Filed Vitals:   05/20/15 1110  BP: 128/83  Pulse: 90  Temp: 98.4 F (36.9 C)  Resp: 16    Physical Exam: Constitutional: Patient appears well-developed and well-nourished. No distress. HENT: Normocephalic, atraumatic, External right and left ear normal. Oropharynx is clear and moist.  Eyes: Conjunctivae and EOM are normal. PERRLA, no scleral icterus. Neck: Normal ROM. Neck supple. No JVD. No tracheal deviation. No thyromegaly. CVS: RRR, S1/S2 +, no murmurs, no gallops, no carotid bruit.  Pulmonary: Effort and breath sounds normal, no stridor, rhonchi, wheezes, rales.  Abdominal: Soft. BS +,  no distension, tenderness, rebound or guarding.  Musculoskeletal: Left knee brace in place, restricted and painful on range of motion.  Lymphadenopathy: No lymphadenopathy noted, cervical, inguinal or axillary Neuro: Alert. Normal reflexes, muscle tone coordination. No cranial nerve deficit. Skin: Skin is warm and dry. No rash noted. Not diaphoretic. No erythema. No pallor. Psychiatric: Normal mood and affect. Behavior, judgment, thought content normal.  Lab Results  Component Value Date   WBC 7.2 05/16/2015   HGB 13.0 05/16/2015   HCT 39.3 05/16/2015   MCV 94.7 05/16/2015   PLT 269 05/16/2015   Lab Results  Component Value Date   CREATININE 1.29* 05/16/2015   BUN 13 05/16/2015   NA 138 05/16/2015   K 4.3 05/16/2015   CL 107 05/16/2015   CO2 26 05/16/2015         Assessment and plan:  Alcohol abuse: He tells me his last drink of alcohol was 3 weeks ago. Meanwhile he was hospitalized for alcohol intoxication 6 days ago. Advised to fill his prescriptions for thiamine, folic acid.  Left knee MCL tear: Advised to use knee brace as prescribed. Tylenol for pain. Unable to use NSAIDs at this time as he  did have an elevated creatinine in the 1.4 range during hospitalization. If pain persist he would need tramadol.  Left cerebellopontine angle mass/? Vestibular schwannoma: Referred to ENT. He has been advised to obtained [facility] card and [facility] discount which will facilitate the referral process as he has no medical coverage.  Lung nodules: Advised to keep appointment with pulmonary.  This note has been created with Surveyor, quantity. Any transcriptional errors are unintentional.         Arnoldo Morale, MD. [facility] [phone] 05/20/2015, 12:04 PM
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
## 2015-06-03 ENCOUNTER — Encounter (HOSPITAL_COMMUNITY): Payer: Self-pay

## 2015-06-03 ENCOUNTER — Emergency Department (HOSPITAL_COMMUNITY): Payer: Self-pay

## 2015-06-03 ENCOUNTER — Emergency Department (HOSPITAL_COMMUNITY)
Admission: EM | Admit: 2015-06-03 | Discharge: 2015-06-03 | Disposition: A | Discharge status: Home / Self Care | Payer: Self-pay | Attending: Emergency Medicine | Admitting: Emergency Medicine

## 2015-06-03 DIAGNOSIS — F10120 Alcohol abuse with intoxication, uncomplicated: Secondary | ICD-10-CM | POA: Insufficient documentation

## 2015-06-03 DIAGNOSIS — R079 Chest pain, unspecified: Secondary | ICD-10-CM | POA: Insufficient documentation

## 2015-06-03 DIAGNOSIS — Z87828 Personal history of other (healed) physical injury and trauma: Secondary | ICD-10-CM | POA: Insufficient documentation

## 2015-06-03 DIAGNOSIS — F10920 Alcohol use, unspecified with intoxication, uncomplicated: Secondary | ICD-10-CM

## 2015-06-03 DIAGNOSIS — J45901 Unspecified asthma with (acute) exacerbation: Secondary | ICD-10-CM | POA: Insufficient documentation

## 2015-06-03 DIAGNOSIS — F1410 Cocaine abuse, uncomplicated: Secondary | ICD-10-CM | POA: Insufficient documentation

## 2015-06-03 DIAGNOSIS — Z79899 Other long term (current) drug therapy: Secondary | ICD-10-CM | POA: Insufficient documentation

## 2015-06-03 DIAGNOSIS — R51 Headache: Secondary | ICD-10-CM | POA: Insufficient documentation

## 2015-06-03 DIAGNOSIS — R062 Wheezing: Secondary | ICD-10-CM

## 2015-06-03 DIAGNOSIS — Z720 Tobacco use: Secondary | ICD-10-CM | POA: Insufficient documentation

## 2015-06-03 LAB — I-STAT TROPONIN, ED: Troponin i, poc: 0.02 ng/mL (ref 0.00–0.08)

## 2015-06-03 LAB — CBC
HEMATOCRIT: 40.8 % (ref 39.0–52.0)
HEMOGLOBIN: 14.1 g/dL (ref 13.0–17.0)
MCH: 31.7 pg (ref 26.0–34.0)
MCHC: 34.6 g/dL (ref 30.0–36.0)
MCV: 91.7 fL (ref 78.0–100.0)
Platelets: 251 10*3/uL (ref 150–400)
RBC: 4.45 MIL/uL (ref 4.22–5.81)
RDW: 13.6 % (ref 11.5–15.5)
WBC: 10.5 10*3/uL (ref 4.0–10.5)

## 2015-06-03 LAB — BASIC METABOLIC PANEL
ANION GAP: 12 (ref 5–15)
BUN: 11 mg/dL (ref 6–20)
CHLORIDE: 108 mmol/L (ref 101–111)
CO2: 21 mmol/L — ABNORMAL LOW (ref 22–32)
Calcium: 9.1 mg/dL (ref 8.9–10.3)
Creatinine, Ser: 1.11 mg/dL (ref 0.61–1.24)
GFR calc Af Amer: >60 mL/min (ref >60)
GLUCOSE: 88 mg/dL (ref 65–99)
POTASSIUM: 5.4 mmol/L — AB (ref 3.5–5.1)
Sodium: 141 mmol/L (ref 135–145)

## 2015-06-03 LAB — RAPID URINE DRUG SCREEN, HOSP PERFORMED
AMPHETAMINES: NOT DETECTED
BARBITURATES: NOT DETECTED
BENZODIAZEPINES: NOT DETECTED
COCAINE: POSITIVE — AB
Opiates: NOT DETECTED
Tetrahydrocannabinol: NOT DETECTED

## 2015-06-03 LAB — ETHANOL: ALCOHOL ETHYL (B): 155 mg/dL — AB (ref <5)

## 2015-06-03 MED ORDER — ALBUTEROL SULFATE HFA 108 (90 BASE) MCG/ACT IN AERS: 2.0000 | INHALATION_SPRAY | RESPIRATORY_TRACT | Status: DC | PRN

## 2015-06-03 MED ORDER — ACETAMINOPHEN 500 MG PO TABS
1000.0000 mg | ORAL_TABLET | Freq: Once | ORAL | Status: AC
  Administered 2015-06-03: 1000 mg via ORAL
  Filled 2015-06-03: qty 2

## 2015-06-03 MED ORDER — KETOROLAC TROMETHAMINE 30 MG/ML IJ SOLN
30.0000 mg | Freq: Once | INTRAMUSCULAR | Status: AC
  Administered 2015-06-03: 30 mg via INTRAVENOUS
  Filled 2015-06-03: qty 1

## 2015-06-03 MED ORDER — ALBUTEROL SULFATE HFA 108 (90 BASE) MCG/ACT IN AERS
2.0000 | INHALATION_SPRAY | RESPIRATORY_TRACT | Status: DC
  Administered 2015-06-03: 2 via RESPIRATORY_TRACT
  Filled 2015-06-03: qty 6.7
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
## 2015-06-03 NOTE — ED Notes (Signed)
Pt states he thinks he drank "about 3 40 oz beers" throughout the day.

## 2015-06-03 NOTE — ED Notes (Signed)
Patient transported to X-ray 

## 2015-06-03 NOTE — ED Notes (Signed)
Pt getting dressed.
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
## 2015-06-03 NOTE — Discharge Instructions (Signed)
Nonspecific Chest Pain  °Chest pain can be caused by many different conditions. There is always a chance that your pain could be related to something serious, such as a heart attack or a blood clot in your lungs. Chest pain can also be caused by conditions that are not life-threatening. If you have chest pain, it is very important to follow up with your health care provider. °CAUSES  °Chest pain can be caused by: °· Heartburn. °· Pneumonia or bronchitis. °· Anxiety or stress. °· Inflammation around your heart (pericarditis) or lung (pleuritis or pleurisy). °· A blood clot in your lung. °· A collapsed lung (pneumothorax). It can develop suddenly on its own (spontaneous pneumothorax) or from trauma to the chest. °· Shingles infection (varicella-zoster virus). °· Heart attack. °· Damage to the bones, muscles, and cartilage that make up your chest wall. This can include: °¨ Bruised bones due to injury. °¨ Strained muscles or cartilage due to frequent or repeated coughing or overwork. °¨ Fracture to one or more ribs. °¨ Sore cartilage due to inflammation (costochondritis). °RISK FACTORS  °Risk factors for chest pain may include: °· Activities that increase your risk for trauma or injury to your chest. °· Respiratory infections or conditions that cause frequent coughing. °· Medical conditions or overeating that can cause heartburn. °· Heart disease or family history of heart disease. °· Conditions or health behaviors that increase your risk of developing a blood clot. °· Having had chicken pox (varicella zoster). °SIGNS AND SYMPTOMS °Chest pain can feel like: °· Burning or tingling on the surface of your chest or deep in your chest. °· Crushing, pressure, aching, or squeezing pain. °· Dull or sharp pain that is worse when you move, cough, or take a deep breath. °· Pain that is also felt in your back, neck, shoulder, or arm, or pain that spreads to any of these areas. °Your chest pain may come and go, or it may stay  constant. °DIAGNOSIS °Lab tests or other studies may be needed to find the cause of your pain. Your health care provider may have you take a test called an ambulatory ECG (electrocardiogram). An ECG records your heartbeat patterns at the time the test is performed. You may also have other tests, such as: °· Transthoracic echocardiogram (TTE). During echocardiography, sound waves are used to create a picture of all of the heart structures and to look at how blood flows through your heart. °· Transesophageal echocardiogram (TEE). This is a more advanced imaging test that obtains images from inside your body. It allows your health care provider to see your heart in finer detail. °· Cardiac monitoring. This allows your health care provider to monitor your heart rate and rhythm in real time. °· Holter monitor. This is a portable device that records your heartbeat and can help to diagnose abnormal heartbeats. It allows your health care provider to track your heart activity for several days, if needed. °· Stress tests. These can be done through exercise or by taking medicine that makes your heart beat more quickly. °· Blood tests. °· Imaging tests. °TREATMENT  °Your treatment depends on what is causing your chest pain. Treatment may include: °· Medicines. These may include: °¨ Acid blockers for heartburn. °¨ Anti-inflammatory medicine. °¨ Pain medicine for inflammatory conditions. °¨ Antibiotic medicine, if an infection is present. °¨ Medicines to dissolve blood clots. °¨ Medicines to treat coronary artery disease. °· Supportive care for conditions that do not require medicines. This may include: °¨ Resting. °¨ Applying heat   or cold packs to injured areas. °¨ Limiting activities until pain decreases. °HOME CARE INSTRUCTIONS °· If you were prescribed an antibiotic medicine, finish it all even if you start to feel better. °· Avoid any activities that bring on chest pain. °· Do not use any tobacco products, including  cigarettes, chewing tobacco, or electronic cigarettes. If you need help quitting, ask your health care provider. °· Do not drink alcohol. °· Take medicines only as directed by your health care provider. °· Keep all follow-up visits as directed by your health care provider. This is important. This includes any further testing if your chest pain does not go away. °· If heartburn is the cause for your chest pain, you may be told to keep your head raised (elevated) while sleeping. This reduces the chance that acid will go from your stomach into your esophagus. °· Make lifestyle changes as directed by your health care provider. These may include: °¨ Getting regular exercise. Ask your health care provider to suggest some activities that are safe for you. °¨ Eating a heart-healthy diet. A registered dietitian can help you to learn healthy eating options. °¨ Maintaining a healthy weight. °¨ Managing diabetes, if necessary. °¨ Reducing stress. °SEEK MEDICAL CARE IF: °· Your chest pain does not go away after treatment. °· You have a rash with blisters on your chest. °· You have a fever. °SEEK IMMEDIATE MEDICAL CARE IF:  °· Your chest pain is worse. °· You have an increasing cough, or you cough up blood. °· You have severe abdominal pain. °· You have severe weakness. °· You faint. °· You have chills. °· You have sudden, unexplained chest discomfort. °· You have sudden, unexplained discomfort in your arms, back, neck, or jaw. °· You have shortness of breath at any time. °· You suddenly start to sweat, or your skin gets clammy. °· You feel nauseous or you vomit. °· You suddenly feel light-headed or dizzy. °· Your heart begins to beat quickly, or it feels like it is skipping beats. °These symptoms may represent a serious problem that is an emergency. Do not wait to see if the symptoms will go away. Get medical help right away. Call your local emergency services (911 in the [place] Do not drive yourself to the hospital. °  °This  information is not intended to replace advice given to you by your health care provider. Make sure you discuss any questions you have with your health care provider. °  °Document Released: 04/18/2005 Document Revised: 07/30/2014 Document Reviewed: 02/12/2014 °Elsevier Interactive Patient Education ©2016 [employer] ° °

## 2015-06-07 ENCOUNTER — Other Ambulatory Visit: Payer: Self-pay | Admitting: Pulmonary Disease

## 2015-06-07 DIAGNOSIS — R918 Other nonspecific abnormal finding of lung field: Secondary | ICD-10-CM

## 2015-06-07 NOTE — Progress Notes (Signed)
10.22.16 CT Chest recommends PET Pt is scheduled to see PM for consult on 11.28.16 Okay per PM to order/schedule PET prior to ov  Order placed

## 2015-06-15 ENCOUNTER — Ambulatory Visit (HOSPITAL_COMMUNITY): Payer: Self-pay

## 2015-06-20 ENCOUNTER — Inpatient Hospital Stay: Payer: No Typology Code available for payment source | Admitting: Pulmonary Disease

## 2017-10-09 ENCOUNTER — Emergency Department (HOSPITAL_COMMUNITY)
Admission: EM | Admit: 2017-10-09 | Discharge: 2017-10-09 | Disposition: A | Discharge status: Home / Self Care | Payer: BLUE CROSS/BLUE SHIELD | Attending: Emergency Medicine | Admitting: Emergency Medicine

## 2017-10-09 ENCOUNTER — Encounter (HOSPITAL_COMMUNITY): Payer: Self-pay | Admitting: *Deleted

## 2017-10-09 ENCOUNTER — Emergency Department (HOSPITAL_COMMUNITY): Payer: BLUE CROSS/BLUE SHIELD

## 2017-10-09 ENCOUNTER — Other Ambulatory Visit: Payer: Self-pay

## 2017-10-09 DIAGNOSIS — R5383 Other fatigue: Secondary | ICD-10-CM

## 2017-10-09 DIAGNOSIS — J45909 Unspecified asthma, uncomplicated: Secondary | ICD-10-CM | POA: Diagnosis not present

## 2017-10-09 DIAGNOSIS — Z79899 Other long term (current) drug therapy: Secondary | ICD-10-CM | POA: Diagnosis not present

## 2017-10-09 DIAGNOSIS — D333 Benign neoplasm of cranial nerves: Secondary | ICD-10-CM | POA: Diagnosis not present

## 2017-10-09 DIAGNOSIS — R51 Headache: Secondary | ICD-10-CM | POA: Diagnosis present

## 2017-10-09 DIAGNOSIS — F17210 Nicotine dependence, cigarettes, uncomplicated: Secondary | ICD-10-CM | POA: Diagnosis not present

## 2017-10-09 DIAGNOSIS — R519 Headache, unspecified: Secondary | ICD-10-CM

## 2017-10-09 LAB — COMPREHENSIVE METABOLIC PANEL
ALBUMIN: 3.7 g/dL (ref 3.5–5.0)
ALT: 22 U/L (ref 17–63)
AST: 44 U/L — AB (ref 15–41)
Alkaline Phosphatase: 95 U/L (ref 38–126)
Anion gap: 11 (ref 5–15)
BUN: 14 mg/dL (ref 6–20)
CHLORIDE: 104 mmol/L (ref 101–111)
CO2: 24 mmol/L (ref 22–32)
CREATININE: 1.54 mg/dL — AB (ref 0.61–1.24)
Calcium: 9.6 mg/dL (ref 8.9–10.3)
GFR calc non Af Amer: 53 mL/min — ABNORMAL LOW (ref >60)
Glucose, Bld: 117 mg/dL — ABNORMAL HIGH (ref 65–99)
Potassium: 4.2 mmol/L (ref 3.5–5.1)
SODIUM: 139 mmol/L (ref 135–145)
Total Bilirubin: 0.1 mg/dL — ABNORMAL LOW (ref 0.3–1.2)
Total Protein: 7.3 g/dL (ref 6.5–8.1)

## 2017-10-09 LAB — CBC WITH DIFFERENTIAL/PLATELET
Basophils Absolute: 0 10*3/uL (ref 0.0–0.1)
Basophils Relative: 0 %
EOS ABS: 0.2 10*3/uL (ref 0.0–0.7)
EOS PCT: 2 %
HCT: 44.3 % (ref 39.0–52.0)
Hemoglobin: 14.5 g/dL (ref 13.0–17.0)
Lymphocytes Relative: 24 %
Lymphs Abs: 2.4 10*3/uL (ref 0.7–4.0)
MCH: 31.2 pg (ref 26.0–34.0)
MCHC: 32.7 g/dL (ref 30.0–36.0)
MCV: 95.3 fL (ref 78.0–100.0)
Monocytes Absolute: 0.4 10*3/uL (ref 0.1–1.0)
Monocytes Relative: 4 %
Neutro Abs: 7 10*3/uL (ref 1.7–7.7)
Neutrophils Relative %: 70 %
PLATELETS: 336 10*3/uL (ref 150–400)
RBC: 4.65 MIL/uL (ref 4.22–5.81)
RDW: 14.3 % (ref 11.5–15.5)
WBC: 10 10*3/uL (ref 4.0–10.5)

## 2017-10-09 MED ORDER — BUTALBITAL-APAP-CAFFEINE 50-325-40 MG PO TABS: 1.0000 | ORAL_TABLET | Freq: Four times a day (QID) | ORAL | 0 refills | Status: DC | PRN

## 2017-10-09 NOTE — Discharge Instructions (Signed)
It is very important that you use the bus pass you were given and go to fill out the paperwork to obtain your orange card.  Do not hesitate to return to the emergency department for any new, worsening or concerning symptoms.

## 2017-10-09 NOTE — ED Triage Notes (Addendum)
To ED for eval of intermittent HA over the past month. Hx of brain tumor. Complains of increased fatigue. Denies vomiting. Denies fevers. Pt states he was to follow up with ENT to watch the tumor. OTC meds not helping HA. Appears in nad

## 2017-10-09 NOTE — Clinical Social Work Note (Signed)
Clinical Social Work Assessment  Patient Details  Name: Craig Schmidt MRN: [id number] Date of Birth: June 16, 1971  Date of referral:  10/09/17               Reason for consult:  Housing Concerns/Homelessness                Permission sought to share information with:    Permission granted to share information::     Name::        Agency::     Relationship::     Contact Information:     Housing/Transportation Living arrangements for the past 2 months:  Homeless Source of Information:  Patient Patient Interpreter Needed:  None Criminal Activity/Legal Involvement Pertinent to Current Situation/Hospitalization:    Significant Relationships:  Friend Lives with:  Self Do you feel safe going back to the place where you live?  Yes Need for family participation in patient care:  Yes (Comment)  Care giving concerns:  Pt consulted due to homelessness.   Social Worker assessment / plan:  CSW met with pt via pt's bedside. Pt explained that he has been living here and there, but does not have a consistent place to stay. CSW provided pt with homeless resources in the area. CSW encouraged pt to utilize the [facility] in [place] to see what additional resources he may benefit. Pt was thankful and appreciative. CSW provided pt with bus tickets to get home and then to get to the [facility] for a follow up appointment.   Employment status:    Insurance information:  Self Pay (Medicaid Pending) PT Recommendations:  Not assessed at this time Information / Referral to community resources:  Shelter  Patient/Family's Response to care:  Pt is agreeable to plan of care.   Patient/Family's Understanding of and Emotional Response to Diagnosis, Current Treatment, and Prognosis:  Pt is understanding of diagnosis, current treatment, and prognosis. Pt understands that he can go to the [facility] to see the nurse practitioner.   Emotional Assessment Appearance:  Appears stated  age Attitude/Demeanor/Rapport:    Affect (typically observed):  Appropriate, Accepting, Pleasant Orientation:  Oriented to Self, Oriented to Place, Oriented to  Time, Oriented to Situation Alcohol / Substance use:    Psych involvement (Current and /or in the community):  No (Comment)  Discharge Needs  Concerns to be addressed:  Homelessness Readmission within the last 30 days:  No Current discharge risk:  Homeless Barriers to Discharge:  Barriers Resolved   Wendelyn Breslow, LCSW 10/09/2017, 6:30 PM

## 2017-10-09 NOTE — Care Management Note (Signed)
Case Management Note  Patient Details  Name: Craig Schmidt MRN: [id number] Date of Birth: 27-Dec-1970  Subjective/Objective:                  HA; Fatigue  Action/Plan: EDCM spoke with the patient at the bedside. CM consulted for an orange card, patient needs an ENT consultation. Patient provided with the flyer for [facility] as patient states he does not have a PCP. Explained he can apply for an orange card at the [facility]. Encouraged patient to call tomorrow to schedule an appointment. He reports he stays various places but is "basically homeless." He has a sister who is able to take him to appointments at times when she is well. CSW consult entered.  Expected Discharge Date:               Expected Discharge Plan:  Home/Self Care  In-House Referral:  Clinical Social Work  Discharge planning Services  CM Consult, [facility]  Post Acute Care Choice:    Choice offered to:     DME Arranged:    DME Agency:     HH Arranged:    [place] Agency:     Status of Service:  Completed, signed off  If discussed at [employer], dates discussed:    Additional Comments:  Apolonio Schneiders, RN 10/09/2017, 5:59 PM

## 2017-10-09 NOTE — ED Notes (Signed)
Pt states that he has been falling asleep suddenly for the last month, "maybe longer"
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
## 2017-10-09 NOTE — ED Notes (Signed)
Pt verbalizes understanding of d/c instructions. Pt received prescriptions. Pt ambulatory at d/c with all belongings.  

## 2017-10-20 ENCOUNTER — Emergency Department (HOSPITAL_COMMUNITY)
Admission: EM | Admit: 2017-10-20 | Discharge: 2017-10-20 | Disposition: A | Discharge status: Home / Self Care | Payer: BLUE CROSS/BLUE SHIELD | Attending: Emergency Medicine | Admitting: Emergency Medicine

## 2017-10-20 ENCOUNTER — Emergency Department (HOSPITAL_COMMUNITY): Payer: BLUE CROSS/BLUE SHIELD

## 2017-10-20 ENCOUNTER — Encounter (HOSPITAL_COMMUNITY): Payer: Self-pay | Admitting: Emergency Medicine

## 2017-10-20 DIAGNOSIS — B356 Tinea cruris: Secondary | ICD-10-CM

## 2017-10-20 DIAGNOSIS — N5089 Other specified disorders of the male genital organs: Secondary | ICD-10-CM | POA: Diagnosis present

## 2017-10-20 DIAGNOSIS — N503 Cyst of epididymis: Secondary | ICD-10-CM | POA: Insufficient documentation

## 2017-10-20 DIAGNOSIS — L298 Other pruritus: Secondary | ICD-10-CM | POA: Insufficient documentation

## 2017-10-20 DIAGNOSIS — J45909 Unspecified asthma, uncomplicated: Secondary | ICD-10-CM | POA: Insufficient documentation

## 2017-10-20 DIAGNOSIS — R52 Pain, unspecified: Secondary | ICD-10-CM

## 2017-10-20 DIAGNOSIS — F17210 Nicotine dependence, cigarettes, uncomplicated: Secondary | ICD-10-CM | POA: Diagnosis not present

## 2017-10-20 DIAGNOSIS — Z79899 Other long term (current) drug therapy: Secondary | ICD-10-CM | POA: Diagnosis not present

## 2017-10-20 MED ORDER — CLOTRIMAZOLE 1 % EX CREA: TOPICAL_CREAM | CUTANEOUS | 0 refills | Status: DC

## 2017-10-20 MED ORDER — DOXYCYCLINE HYCLATE 100 MG PO TABS
100.0000 mg | ORAL_TABLET | Freq: Once | ORAL | Status: AC
  Administered 2017-10-20: 100 mg via ORAL
  Filled 2017-10-20: qty 1

## 2017-10-20 MED ORDER — DOXYCYCLINE HYCLATE 100 MG PO CAPS: 100.0000 mg | ORAL_CAPSULE | Freq: Two times a day (BID) | ORAL | 0 refills | Status: DC

## 2017-10-20 NOTE — ED Provider Notes (Signed)
[place] EMERGENCY DEPARTMENT Provider Note   CSN: [id number] Arrival date & time: 10/20/17  1056     History   Chief Complaint Chief Complaint  Patient presents with  . Groin Swelling    HPI Craig Schmidt is a 47 y.o. male.  The history is provided by the patient. No language interpreter was used.  Testicle Pain  This is a new problem. The problem occurs constantly. The problem has been gradually worsening. Nothing aggravates the symptoms. Nothing relieves the symptoms. He has tried nothing for the symptoms.   Pt complains of a swollen area to his right testicle.  Pt reports she has noticed before but he reports increased swelling.  Pt reports irritation to skin as well.   Past Medical History:  Diagnosis Date  . Asthma     Patient Active Problem List   Diagnosis Date Noted  . 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 Tear of MCL (medial collateral ligament) of knee 05/20/2015  . Protein-calorie malnutrition, severe 05/17/2015  . Lactic acidosis 05/15/2015  . Left knee pain 05/15/2015  . Lung nodules 05/15/2015  . Hypoglycemia 05/14/2015  . Hypothermia 05/14/2015  . Acute encephalopathy 05/14/2015  . Cocaine abuse with intoxication ([place]) 05/14/2015  . Alcohol intoxication in active alcoholic ([place]) 47/82/9562  . Sepsis ([place]) 05/14/2015  . Homeless 05/14/2015    History reviewed. No pertinent surgical history.      Home Medications    Prior to Admission medications   Medication Sig Start Date End Date Taking? Authorizing Provider  acetaminophen (TYLENOL) 325 MG tablet Take 2 tablets (650 mg total) by mouth every 6 (six) hours as needed for mild pain or moderate pain. 05/17/15   Robbie Lis, MD  albuterol (PROVENTIL HFA;VENTOLIN HFA) 108 (90 BASE) MCG/ACT inhaler Inhale 2 puffs into the lungs every 4 (four) hours as needed for wheezing or shortness of breath. 06/03/15   Jola Schmidt, MD  butalbital-acetaminophen-caffeine  (FIORICET, ESGIC) [phone] MG tablet Take 1 tablet by mouth every 6 (six) hours as needed for headache. 10/09/17 10/09/18  Pisciotta, Elmyra Ricks, [name]  folic acid (FOLVITE) 1 MG tablet Take 1 tablet (1 mg total) by mouth daily. 05/17/15   Robbie Lis, MD    Family History No family history on file.  Social History Social History   Tobacco Use  . Smoking status: Current Every Day Smoker    Packs/day: 1.50    Types: Cigarettes  . Smokeless tobacco: Never Used  Substance Use Topics  . Alcohol use: Yes  . Drug use: No     Allergies   Patient has no known allergies.   Review of Systems Review of Systems  Genitourinary: Positive for testicular pain.  All other systems reviewed and are negative.    Physical Exam Updated Vital Signs BP 139/87 (BP Location: Right Arm)   Pulse (!) 108   Temp 97.8 F (36.6 C) (Oral)   Resp 20   SpO2 99%   Physical Exam  Constitutional: He appears well-developed and well-nourished.  HENT:  Head: Normocephalic and atraumatic.  Eyes: Conjunctivae are normal.  Neck: Neck supple.  Cardiovascular: Normal rate and regular rhythm.  No murmur heard. Pulmonary/Chest: Effort normal and breath sounds normal. No respiratory distress.  Abdominal: Soft. There is no tenderness.  Genitourinary:  Genitourinary Comments: Palpable irregularity scrotum,  Erythema to skin and inner thigh.    Musculoskeletal: Normal range of motion. He exhibits no edema.  Neurological: He is alert.  Skin: Skin is  warm and dry.  Psychiatric: He has a normal mood and affect.  Nursing note and vitals reviewed.    ED Treatments / Results  Labs (all labs ordered are listed, but only abnormal results are displayed) Labs Reviewed - No data to display  EKG None  Radiology US Scrotum W/doppler  Result Date: 10/20/2017 CLINICAL DATA:  Acute right testicular pain. EXAM: SCROTAL ULTRASOUND DOPPLER ULTRASOUND OF THE TESTICLES TECHNIQUE: Complete ultrasound examination of the  testicles, epididymis, and other scrotal structures was performed. Color and spectral Doppler ultrasound were also utilized to evaluate blood flow to the testicles. COMPARISON:  None. FINDINGS: Right testicle Measurements: 3.5 x 2.3 x 2.3 cm. No mass or microlithiasis visualized. Left testicle Measurements: 3.2 x 1.9 x 1.7 cm. No mass or microlithiasis visualized. Right epididymis:  7 mm cyst is noted. Left epididymis: Multiple cysts are noted in the head, with the largest measuring 9 mm. There is either a single septated cystic structure or 2 adjacent cystic structures posterior to the left testicle which may be arising from the epididymal head. Alternatively, this may represent tunica albuginea cyst. This combines structure measures 3.4 x 2.6 x 2.2 cm. Hydrocele:  None visualized. Varicocele:  None visualized. Pulsed Doppler interrogation of both testes demonstrates normal low resistance arterial and venous waveforms bilaterally. IMPRESSION: No evidence of testicular torsion or mass. Multiple small cysts are noted in the left epididymal head. There is either a single septated cystic structure or 2 adjacent cystic structures posterior to the left testicle, with a combined size of 3.4 x 2.6 x 2.2 cm. It is uncertain if these represent large epididymal cysts or potentially tunica albuginea cyst, which is benign lesion. Electronically Signed   By: Marijo Conception, M.D.   On: 10/20/2017 14:39    Procedures Procedures (including critical care time)  Medications Ordered in ED Medications - No data to display   Initial Impression / Assessment and Plan / ED Course  I have reviewed the triage vital signs and the nursing notes.  Pertinent labs & imaging results that were available during my care of the patient were reviewed by me and considered in my medical decision making (see chart for details).   MDM  Ultrasound shows cyst,  This explains swollen areas,  I am going to cover external irritation/infection  with doxycycline and lotrim.   Pt is advised to see Urology for complete evalutaion    Final Clinical Impressions(s) / ED Diagnoses   Final diagnoses:  Pain  Epididymal cyst  Jock itch    ED Discharge Orders    None    An After Visit Summary was printed and given to the patient.  Meds ordered this encounter  Medications  . doxycycline (VIBRAMYCIN) 100 MG capsule    Sig: Take 1 capsule (100 mg total) by mouth 2 (two) times daily.    Dispense:  20 capsule    Refill:  0    Order Specific Question:   Supervising Provider    Answer:   MILLER, BRIAN [[id number]]  . clotrimazole (LOTRIMIN) 1 % cream    Sig: Apply to affected area 2 times daily    Dispense:  15 g    Refill:  0    Order Specific Question:   Supervising Provider    Answer:   Noemi Chapel [[id number]]     Fransico Meadow, [name] 10/20/17 1517    Valarie Merino, MD 10/20/17 2144

## 2017-10-20 NOTE — Discharge Instructions (Signed)
Schedule to see the Urologist for evaluation.  Ice to area of swelling

## 2017-10-20 NOTE — ED Triage Notes (Signed)
Pt to ED c/o R testicle swelling, intermittent over the last 6 months to 1 year. Reports pain with increased swelling. Sts he thought it was r/t allergic reaction to soap, but has changed soaps and it keeps coming back.

## 2017-10-20 NOTE — ED Notes (Signed)
Patient transported to Ultrasound 

## 2017-10-21 ENCOUNTER — Encounter: Payer: Self-pay | Admitting: *Deleted

## 2017-10-21 ENCOUNTER — Telehealth: Payer: Self-pay | Admitting: *Deleted

## 2017-10-21 NOTE — Progress Notes (Unsigned)
Pt walked in ER stating he could not afford ANY co-pay.  States his insurance has lapsed and he really needs Rx.   EDCM reviewed chart review information and spoke with the pt about [facility] MATCH program ($3 co pay for each Rx through [facility] program, does not include refills, 7 day expiration of [place] letter and choice of pharmacies). Pt is eligible for [facility] MATCH program (unable to find pt listed in PROCARE per cardholder name inquiry) and has agreed to accept [place] under terms discussed. PROCARE information entered. [place] letter completed and provided to pt.

## 2017-10-21 NOTE — Telephone Encounter (Signed)
[facility] consulted in regards to medication assistance.  Pt has insurance coverage and is not eligible for [facility] program.  [facility] searched for Rx discount cards and found that [employer] has affordable pricing.  EDCM texted coupon to pt phone with directions for use.  Pt verbalized understanding.  No further CM needs communicated at this time.

## 2017-10-27 ENCOUNTER — Emergency Department (HOSPITAL_COMMUNITY)
Admission: EM | Admit: 2017-10-27 | Discharge: 2017-10-27 | Disposition: A | Discharge status: Home / Self Care | Payer: BLUE CROSS/BLUE SHIELD | Attending: Emergency Medicine | Admitting: Emergency Medicine

## 2017-10-27 ENCOUNTER — Encounter (HOSPITAL_COMMUNITY): Payer: Self-pay | Admitting: Emergency Medicine

## 2017-10-27 DIAGNOSIS — G4489 Other headache syndrome: Secondary | ICD-10-CM | POA: Insufficient documentation

## 2017-10-27 DIAGNOSIS — F17210 Nicotine dependence, cigarettes, uncomplicated: Secondary | ICD-10-CM | POA: Insufficient documentation

## 2017-10-27 DIAGNOSIS — J45909 Unspecified asthma, uncomplicated: Secondary | ICD-10-CM | POA: Insufficient documentation

## 2017-10-27 DIAGNOSIS — R51 Headache: Secondary | ICD-10-CM | POA: Diagnosis present

## 2017-10-27 MED ORDER — DIPHENHYDRAMINE HCL 50 MG/ML IJ SOLN
25.0000 mg | Freq: Once | INTRAMUSCULAR | Status: AC
  Administered 2017-10-27: 25 mg via INTRAVENOUS
  Filled 2017-10-27: qty 1

## 2017-10-27 MED ORDER — METOCLOPRAMIDE HCL 5 MG/ML IJ SOLN
10.0000 mg | Freq: Once | INTRAMUSCULAR | Status: AC
  Administered 2017-10-27: 10 mg via INTRAVENOUS
  Filled 2017-10-27: qty 2

## 2017-10-27 MED ORDER — KETOROLAC TROMETHAMINE 30 MG/ML IJ SOLN
15.0000 mg | Freq: Once | INTRAMUSCULAR | Status: AC
  Administered 2017-10-27: 15 mg via INTRAVENOUS
  Filled 2017-10-27: qty 1

## 2017-10-27 NOTE — ED Triage Notes (Signed)
Brought by ems for c/o headache.  Hx of same.  Diagnosed with brain tumor a few weeks ago.  Has not seen oncology yet.  Take fioricet BID.  Last dose Saturday morning.

## 2017-10-27 NOTE — Discharge Instructions (Addendum)

## 2017-10-27 NOTE — ED Notes (Signed)
Alert and oriented x4. Pupils equal and reactive to light. Pt able to talk full sentences with clear speech. Pt complaint of headache. Denies blurred vision.

## 2017-10-27 NOTE — ED Provider Notes (Signed)
[place] EMERGENCY DEPARTMENT Provider Note   CSN: [id number] Arrival date & time: 10/27/17  0343     History   Chief Complaint Chief Complaint  Patient presents with  . Headache    HPI Craig Schmidt is a 47 y.o. male.  The history is provided by the patient.  Headache   This is a recurrent problem. The current episode started 6 to 12 hours ago. The problem occurs constantly. The problem has been gradually worsening. The pain is moderate. Pertinent negatives include no fever, no syncope, no shortness of breath and no vomiting. Treatments tried: fioricet.   Presents for recurrent headache.  He reports is similar to prior.  He reports it started 6-12 hours ago.  Gradually worsening. Denies head trauma  Reports he has a history of a brain tumor but has not followed up with oncology yet Past Medical History:  Diagnosis Date  . Asthma     Patient Active Problem List   Diagnosis Date Noted  . 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 Tear of MCL (medial collateral ligament) of knee 05/20/2015  . Protein-calorie malnutrition, severe 05/17/2015  . Lactic acidosis 05/15/2015  . Left knee pain 05/15/2015  . Lung nodules 05/15/2015  . Hypoglycemia 05/14/2015  . Hypothermia 05/14/2015  . Acute encephalopathy 05/14/2015  . Cocaine abuse with intoxication ([place]) 05/14/2015  . Alcohol intoxication in active alcoholic ([place]) 35/36/1443  . Sepsis ([place]) 05/14/2015  . Homeless 05/14/2015    History reviewed. No pertinent surgical history.      Home Medications    Prior to Admission medications   Medication Sig Start Date End Date Taking? Authorizing Provider  acetaminophen (TYLENOL) 325 MG tablet Take 2 tablets (650 mg total) by mouth every 6 (six) hours as needed for mild pain or moderate pain. 05/17/15  Yes Robbie Lis, MD  albuterol (PROVENTIL HFA;VENTOLIN HFA) 108 (90 BASE) MCG/ACT inhaler Inhale 2 puffs into the lungs every 4 (four)  hours as needed for wheezing or shortness of breath. 06/03/15  Yes Jola Schmidt, MD  butalbital-acetaminophen-caffeine (FIORICET, ESGIC) [phone] MG tablet Take 1 tablet by mouth every 6 (six) hours as needed for headache. 10/09/17 10/09/18 Yes Pisciotta, Elmyra Ricks, [name]  clotrimazole (LOTRIMIN) 1 % cream Apply to affected area 2 times daily 10/20/17  Yes Caryl Ada K, [name]  doxycycline (VIBRAMYCIN) 100 MG capsule Take 1 capsule (100 mg total) by mouth 2 (two) times daily. 10/20/17  Yes Caryl Ada K, [name]  folic acid (FOLVITE) 1 MG tablet Take 1 tablet (1 mg total) by mouth daily. Patient not taking: Reported on 10/27/2017 05/17/15   Robbie Lis, MD    Family History No family history on file.  Social History Social History   Tobacco Use  . Smoking status: Current Every Day Smoker    Packs/day: 1.50    Types: Cigarettes  . Smokeless tobacco: Never Used  Substance Use Topics  . Alcohol use: Yes  . Drug use: No     Allergies   Patient has no known allergies.   Review of Systems Review of Systems  Constitutional: Negative for fever.  HENT: Negative for hearing loss.   Eyes: Negative for visual disturbance.  Respiratory: Negative for shortness of breath.   Cardiovascular: Negative for chest pain and syncope.  Gastrointestinal: Negative for vomiting.  Neurological: Positive for headaches. Negative for syncope and weakness.  All other systems reviewed and are negative.    Physical Exam Updated Vital Signs BP (!) 149/104 (BP  Location: Right Arm)   Pulse 85   Temp (!) 97.4 F (36.3 C) (Oral)   Resp 18   Ht 1.702 m (5\' 7" )   Wt 84.8 kg (187 lb)   SpO2 99%   BMI 29.29 kg/m   Physical Exam CONSTITUTIONAL: Disheveled HEAD: Normocephalic/atraumatic no visible trauma EYES: EOMI/PERRL, no nystagmus, no ptosis ENMT: Mucous membranes moist, bilateral TMs clear and intact NECK: supple no meningeal signs, no bruits SPINE/BACK:entire spine nontender CV: S1/S2 noted, no  murmurs/rubs/gallops noted LUNGS: Lungs are clear to auscultation bilaterally, no apparent distress ABDOMEN: soft, nontender, no rebound or guarding GU:no cva tenderness NEURO:Awake/alert, face symmetric, no arm or leg drift is noted Equal 5/5 strength with shoulder abduction, Equal 5/5 strength with hip flexion,knee flex/extension, foot dorsi/plantar flexion Cranial nerves 3/4/5/6/01/28/09/11/12 tested and intact Gait normal without ataxia No past pointing Sensation to light touch intact in all extremities EXTREMITIES: pulses normal, full ROM SKIN: warm, color normal PSYCH: no abnormalities of mood noted, alert and oriented to situation   ED Treatments / Results  Labs (all labs ordered are listed, but only abnormal results are displayed) Labs Reviewed - No data to display  EKG None  Radiology No results found.  Procedures Procedures   Medications Ordered in ED Medications  metoCLOPramide (REGLAN) injection 10 mg (10 mg Intravenous Given 10/27/17 0631)  diphenhydrAMINE (BENADRYL) injection 25 mg (25 mg Intravenous Given 10/27/17 0631)  ketorolac (TORADOL) 30 MG/ML injection 15 mg (15 mg Intravenous Given 10/27/17 0631)     Initial Impression / Assessment and Plan / ED Course  I have reviewed the triage vital signs and the nursing notes.      6:05 AM Previous records reveal patient has a schwannoma.  Recent CT head showed there was no mass-effect.  He supposed to follow with oncology.  We will treat headache and discharge. 7:26 AM Patient improved, resting comfortably.  Vitals improved.  Will discharge home Final Clinical Impressions(s) / ED Diagnoses   Final diagnoses:  Other headache syndrome    ED Discharge Orders    None       Ripley Fraise, MD 10/27/17 [id number]

## 2017-11-05 ENCOUNTER — Encounter (HOSPITAL_COMMUNITY): Payer: Self-pay | Admitting: Emergency Medicine

## 2017-11-05 ENCOUNTER — Other Ambulatory Visit: Payer: Self-pay

## 2017-11-05 ENCOUNTER — Emergency Department (HOSPITAL_COMMUNITY)
Admission: EM | Admit: 2017-11-05 | Discharge: 2017-11-05 | Disposition: A | Discharge status: Home / Self Care | Payer: BLUE CROSS/BLUE SHIELD | Attending: Emergency Medicine | Admitting: Emergency Medicine

## 2017-11-05 DIAGNOSIS — J45909 Unspecified asthma, uncomplicated: Secondary | ICD-10-CM | POA: Insufficient documentation

## 2017-11-05 DIAGNOSIS — R5383 Other fatigue: Secondary | ICD-10-CM

## 2017-11-05 DIAGNOSIS — F17210 Nicotine dependence, cigarettes, uncomplicated: Secondary | ICD-10-CM | POA: Diagnosis not present

## 2017-11-05 DIAGNOSIS — R5381 Other malaise: Secondary | ICD-10-CM | POA: Diagnosis present

## 2017-11-05 DIAGNOSIS — Z79899 Other long term (current) drug therapy: Secondary | ICD-10-CM | POA: Diagnosis not present

## 2017-11-05 HISTORY — DX: Neoplasm of unspecified behavior of brain: D49.6

## 2017-11-05 LAB — COMPREHENSIVE METABOLIC PANEL
ALBUMIN: 3.4 g/dL — AB (ref 3.5–5.0)
ALT: 13 U/L — ABNORMAL LOW (ref 17–63)
ANION GAP: 8 (ref 5–15)
AST: 29 U/L (ref 15–41)
Alkaline Phosphatase: 80 U/L (ref 38–126)
BUN: 16 mg/dL (ref 6–20)
CO2: 25 mmol/L (ref 22–32)
Calcium: 9.4 mg/dL (ref 8.9–10.3)
Chloride: 109 mmol/L (ref 101–111)
Creatinine, Ser: 1.38 mg/dL — ABNORMAL HIGH (ref 0.61–1.24)
GFR calc Af Amer: >60 mL/min (ref >60)
GFR calc non Af Amer: >60 mL/min (ref >60)
GLUCOSE: 96 mg/dL (ref 65–99)
Potassium: 3.8 mmol/L (ref 3.5–5.1)
Sodium: 142 mmol/L (ref 135–145)
Total Bilirubin: 0.3 mg/dL (ref 0.3–1.2)
Total Protein: 6.7 g/dL (ref 6.5–8.1)

## 2017-11-05 LAB — CBC
HCT: 41.1 % (ref 39.0–52.0)
HEMOGLOBIN: 13.3 g/dL (ref 13.0–17.0)
MCH: 30.1 pg (ref 26.0–34.0)
MCHC: 32.4 g/dL (ref 30.0–36.0)
MCV: 93 fL (ref 78.0–100.0)
Platelets: 295 10*3/uL (ref 150–400)
RBC: 4.42 MIL/uL (ref 4.22–5.81)
RDW: 13.6 % (ref 11.5–15.5)
WBC: 9.2 10*3/uL (ref 4.0–10.5)

## 2017-11-05 NOTE — ED Triage Notes (Signed)
Pt states he has a tumor in his head. Pt states he is starting to feel restless, falls asleep standing up. Pt states he has not seen oncologist yet. Neuro intact.  Denies headache.

## 2017-11-05 NOTE — ED Notes (Addendum)
Patient reports that he has a mass, he is not sure if it has gotten bigger, He states that he could fall asleep standing, he reports being sluggish, he reports losing his job for this. He also reports that he forgets words sometimes while in a converstaion.

## 2017-11-05 NOTE — ED Provider Notes (Signed)
[facility] EMERGENCY DEPARTMENT Provider Note   CSN: [id number] Arrival date & time: 11/05/17  9833     History   Chief Complaint Chief Complaint  Patient presents with  . Brain Tumor    Restless, falling asleep    HPI DANTHONY KENDRIX is a 47 y.o. male.  Patient states in past week he feels as though he hasnt had a lot of energy, feels mildly sluggish. Symptoms persistent, mild, without acute or abrupt change today. Pt denies any focal numbness or weakness. No change in speech or vision. No headaches. No chest pain or discomfort. No unusual doe or sob. Denies cough or uri symptoms. Normal appetite. No nvd. No dysuria or gu c/o. No fever or chills. Has hx schwannoma. Denies any current or new dizziness. No change in hearing. States has not followed up as outpt, inquires about referral.  Pt denies heat/cold intolerance. No sweats. No recent weight change.   The history is provided by the patient.    Past Medical History:  Diagnosis Date  . Asthma   . Brain tumor [facility])     Patient Active Problem List   Diagnosis Date Noted  . 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 Tear of MCL (medial collateral ligament) of knee 05/20/2015  . Protein-calorie malnutrition, severe 05/17/2015  . Lactic acidosis 05/15/2015  . Left knee pain 05/15/2015  . Lung nodules 05/15/2015  . Hypoglycemia 05/14/2015  . Hypothermia 05/14/2015  . Acute encephalopathy 05/14/2015  . Cocaine abuse with intoxication ([place]) 05/14/2015  . Alcohol intoxication in active alcoholic ([place]) 82/50/5397  . Sepsis ([place]) 05/14/2015  . Homeless 05/14/2015    History reviewed. No pertinent surgical history.      Home Medications    Prior to Admission medications   Medication Sig Start Date End Date Taking? Authorizing Provider  acetaminophen (TYLENOL) 325 MG tablet Take 2 tablets (650 mg total) by mouth every 6 (six) hours as needed for mild pain or moderate pain. 05/17/15  Yes  Robbie Lis, MD  albuterol (PROVENTIL HFA;VENTOLIN HFA) 108 (90 BASE) MCG/ACT inhaler Inhale 2 puffs into the lungs every 4 (four) hours as needed for wheezing or shortness of breath. 06/03/15  Yes Jola Schmidt, MD  butalbital-acetaminophen-caffeine (FIORICET, ESGIC) [phone] MG tablet Take 1 tablet by mouth every 6 (six) hours as needed for headache. 10/09/17 10/09/18 Yes Pisciotta, Elmyra Ricks, [name]  clotrimazole (LOTRIMIN) 1 % cream Apply to affected area 2 times daily 10/20/17  Yes Caryl Ada K, [name]  doxycycline (VIBRAMYCIN) 100 MG capsule Take 1 capsule (100 mg total) by mouth 2 (two) times daily. 10/20/17  Yes Caryl Ada K, [name]  folic acid (FOLVITE) 1 MG tablet Take 1 tablet (1 mg total) by mouth daily. 05/17/15  Yes Robbie Lis, MD    Family History History reviewed. No pertinent family history.  Social History Social History   Tobacco Use  . Smoking status: Current Every Day Smoker    Packs/day: 1.50    Types: Cigarettes  . Smokeless tobacco: Never Used  Substance Use Topics  . Alcohol use: Yes  . Drug use: No     Allergies   Patient has no known allergies.   Review of Systems Review of Systems  Constitutional: Negative for fever.  HENT: Negative for sore throat.   Eyes: Negative for visual disturbance.  Respiratory: Negative for shortness of breath.   Cardiovascular: Negative for chest pain.  Gastrointestinal: Negative for abdominal pain and vomiting.  Genitourinary: Negative for flank  pain.  Musculoskeletal: Negative for back pain and neck pain.  Skin: Negative for rash.  Neurological: Negative for speech difficulty, numbness and headaches.  Hematological: Does not bruise/bleed easily.  Psychiatric/Behavioral: Negative for confusion.     Physical Exam Updated Vital Signs BP (!) 155/100 (BP Location: Right Arm)   Pulse 69   Temp 97.6 F (36.4 C) (Oral)   Resp 18   SpO2 100%   Physical Exam  Constitutional: He is oriented to person, place, and  time. He appears well-developed and well-nourished. No distress.  HENT:  Head: Atraumatic.  Right Ear: External ear normal.  Left Ear: External ear normal.  Mouth/Throat: Oropharynx is clear and moist.  Eyes: Pupils are equal, round, and reactive to light. Conjunctivae and EOM are normal.  Neck: Neck supple. No tracheal deviation present. No thyromegaly present.  No bruits.   Cardiovascular: Normal rate, regular rhythm, normal heart sounds and intact distal pulses. Exam reveals no gallop and no friction rub.  No murmur heard. Pulmonary/Chest: Effort normal and breath sounds normal. No accessory muscle usage. No respiratory distress.  Abdominal: Soft. Bowel sounds are normal. He exhibits no distension. There is no tenderness.  Musculoskeletal: He exhibits no edema.  Neurological: He is alert and oriented to person, place, and time.  Speech clear/fluent. Motor intact bil, stre 5/5. No pronator drift. sens grossly intact. Steady gait, no imbalance or ataxia.   Skin: Skin is warm and dry. No rash noted. He is not diaphoretic.  Psychiatric: He has a normal mood and affect.  Nursing note and vitals reviewed.    ED Treatments / Results  Labs (all labs ordered are listed, but only abnormal results are displayed) Results for orders placed or performed during the hospital encounter of 11/05/17  CBC  Result Value Ref Range   WBC 9.2 4.0 - 10.5 K/uL   RBC 4.42 4.22 - 5.81 MIL/uL   Hemoglobin 13.3 13.0 - 17.0 g/dL   HCT 41.1 39.0 - 52.0 %   MCV 93.0 78.0 - 100.0 fL   MCH 30.1 26.0 - 34.0 pg   MCHC 32.4 30.0 - 36.0 g/dL   RDW 13.6 11.5 - 15.5 %   Platelets 295 150 - 400 K/uL  Comprehensive metabolic panel  Result Value Ref Range   Sodium 142 135 - 145 mmol/L   Potassium 3.8 3.5 - 5.1 mmol/L   Chloride 109 101 - 111 mmol/L   CO2 25 22 - 32 mmol/L   Glucose, Bld 96 65 - 99 mg/dL   BUN 16 6 - 20 mg/dL   Creatinine, Ser 1.38 (H) 0.61 - 1.24 mg/dL   Calcium 9.4 8.9 - 10.3 mg/dL   Total  Protein 6.7 6.5 - 8.1 g/dL   Albumin 3.4 (L) 3.5 - 5.0 g/dL   AST 29 15 - 41 U/L   ALT 13 (L) 17 - 63 U/L   Alkaline Phosphatase 80 38 - 126 U/L   Total Bilirubin 0.3 0.3 - 1.2 mg/dL   GFR calc non Af Amer >60 >60 mL/min   GFR calc Af Amer >60 >60 mL/min   Anion gap 8 5 - 15   Ct Head Wo Contrast  Result Date: 10/09/2017 CLINICAL DATA:  Headache.  History of brain tumor. EXAM: CT HEAD WITHOUT CONTRAST TECHNIQUE: Contiguous axial images were obtained from the base of the skull through the vertex without intravenous contrast. COMPARISON:  MRI 05/17/2015, CT 05/14/2015 FINDINGS: Brain: Ventricle size and cerebral volume normal. Negative for infarct or hemorrhage. Enhancing mass  in the left internal auditory canal is best seen on prior MRI and difficult to see on CT. This is compatible with vestibular schwannoma. 1 cm component of the mass in the cerebellar pontine angle cistern on the left. Vascular: Negative for hyperdense vessel Skull: Negative.  Mild widening of the left internal auditory canal. Sinuses/Orbits: Negative Other: None IMPRESSION: No acute intracranial abnormality Vestibular schwannoma on the left best seen on prior MRI. No mass-effect on the brainstem. Electronically Signed   By: Franchot Gallo M.D.   On: 10/09/2017 15:31    EKG None  Radiology No results found.  Procedures Procedures (including critical care time)  Medications Ordered in ED Medications - No data to display   Initial Impression / Assessment and Plan / ED Course  I have reviewed the triage vital signs and the nursing notes.  Pertinent labs & imaging results that were available during my care of the patient were reviewed by me and considered in my medical decision making (see chart for details).  Labs.  Reviewed nursing notes and prior charts for additional history. Pt with ct head last month - no gross change in size of mass/schwannoma as compared to prior study a few years prior.   Labs reviewed -  lytes wnl. Cbc unremarkable.   Patient currently appears stable for d/c.  rec close pcp f/u including for recheck bp.  Will provide pt with speciality referral for his mass as well.   Pt currently appears stable for d/c.     Final Clinical Impressions(s) / ED Diagnoses   Final diagnoses:  None    ED Discharge Orders    None       Lajean Saver, MD 11/05/17 1331

## 2017-11-05 NOTE — Discharge Instructions (Signed)
It was our pleasure to provide your ER care today - we hope that you feel better.  Your lab tests look good - very similar to prior results.  On recent CT scan the mass/schwannoma did not appear significantly larger than on prior imaging study. Follow up with specialist - see referral - call office to arrange appointment.  For current symptoms, follow up with primary care doctor in the next 1-2 weeks - also have your blood pressure rechecked then as it is high today.  Return to ER if worse, new symptoms, fevers, trouble breathing, or other medical emergency.

## 2017-11-05 NOTE — ED Notes (Signed)
D/c reviewed with patient-no further questions at this time 

## 2017-11-11 ENCOUNTER — Emergency Department (HOSPITAL_COMMUNITY): Payer: BLUE CROSS/BLUE SHIELD

## 2017-11-11 ENCOUNTER — Emergency Department (HOSPITAL_COMMUNITY)
Admission: EM | Admit: 2017-11-11 | Discharge: 2017-11-11 | Disposition: A | Discharge status: Home / Self Care | Payer: BLUE CROSS/BLUE SHIELD | Attending: Emergency Medicine | Admitting: Emergency Medicine

## 2017-11-11 ENCOUNTER — Encounter (HOSPITAL_COMMUNITY): Payer: Self-pay | Admitting: Emergency Medicine

## 2017-11-11 DIAGNOSIS — R079 Chest pain, unspecified: Secondary | ICD-10-CM | POA: Insufficient documentation

## 2017-11-11 DIAGNOSIS — Z5321 Procedure and treatment not carried out due to patient leaving prior to being seen by health care provider: Secondary | ICD-10-CM | POA: Diagnosis not present

## 2017-11-11 LAB — BASIC METABOLIC PANEL
Anion gap: 10 (ref 5–15)
BUN: 9 mg/dL (ref 6–20)
CALCIUM: 8.9 mg/dL (ref 8.9–10.3)
CHLORIDE: 106 mmol/L (ref 101–111)
CO2: 25 mmol/L (ref 22–32)
Creatinine, Ser: 1.34 mg/dL — ABNORMAL HIGH (ref 0.61–1.24)
Glucose, Bld: 94 mg/dL (ref 65–99)
Potassium: 3.7 mmol/L (ref 3.5–5.1)
SODIUM: 141 mmol/L (ref 135–145)

## 2017-11-11 LAB — CBC
HCT: 39.6 % (ref 39.0–52.0)
Hemoglobin: 13.3 g/dL (ref 13.0–17.0)
MCH: 31 pg (ref 26.0–34.0)
MCHC: 33.6 g/dL (ref 30.0–36.0)
MCV: 92.3 fL (ref 78.0–100.0)
PLATELETS: 272 10*3/uL (ref 150–400)
RBC: 4.29 MIL/uL (ref 4.22–5.81)
RDW: 14.5 % (ref 11.5–15.5)
WBC: 10.3 10*3/uL (ref 4.0–10.5)

## 2017-11-11 LAB — I-STAT TROPONIN, ED: TROPONIN I, POC: 0.04 ng/mL (ref 0.00–0.08)

## 2017-11-11 NOTE — ED Triage Notes (Signed)
Pt arrived via EMS from parking lot on [address]. Pt c/o intermittent squeezing to L chest, numbness to L arm onset yesterday.+cough x 4 days, diarrhea x 2 weeks. ASA 324mg  given by [facility].

## 2017-11-11 NOTE — ED Notes (Signed)
Pt called multiple times for room assignment. No answer. Will call pt later.

## 2017-11-15 ENCOUNTER — Encounter (HOSPITAL_COMMUNITY): Payer: Self-pay

## 2017-11-15 ENCOUNTER — Emergency Department (HOSPITAL_COMMUNITY)
Admission: EM | Admit: 2017-11-15 | Discharge: 2017-11-15 | Disposition: A | Discharge status: Home / Self Care | Payer: BLUE CROSS/BLUE SHIELD | Attending: Emergency Medicine | Admitting: Emergency Medicine

## 2017-11-15 DIAGNOSIS — G43909 Migraine, unspecified, not intractable, without status migrainosus: Secondary | ICD-10-CM | POA: Insufficient documentation

## 2017-11-15 DIAGNOSIS — Z5321 Procedure and treatment not carried out due to patient leaving prior to being seen by health care provider: Secondary | ICD-10-CM | POA: Diagnosis not present

## 2017-11-15 NOTE — ED Triage Notes (Signed)
Patient arrived by [facility] with complaint of migraine headache since 0500. States he has been having migraines the past month since being diagnosed with benign brain tumor. Alert and oriented

## 2017-11-15 NOTE — ED Notes (Signed)
This RN to the lobby to bring pt to treatment area, pt states he has waited too long and wishes to leave. Pt encouraged to stay and be seen by provider. Pt refused.

## 2017-11-16 ENCOUNTER — Other Ambulatory Visit: Payer: Self-pay

## 2017-11-16 ENCOUNTER — Emergency Department (HOSPITAL_COMMUNITY)
Admission: EM | Admit: 2017-11-16 | Discharge: 2017-11-16 | Disposition: A | Discharge status: Home / Self Care | Payer: BLUE CROSS/BLUE SHIELD | Attending: Emergency Medicine | Admitting: Emergency Medicine

## 2017-11-16 ENCOUNTER — Emergency Department (HOSPITAL_COMMUNITY): Payer: BLUE CROSS/BLUE SHIELD

## 2017-11-16 ENCOUNTER — Encounter (HOSPITAL_COMMUNITY): Payer: Self-pay | Admitting: Emergency Medicine

## 2017-11-16 DIAGNOSIS — Z79899 Other long term (current) drug therapy: Secondary | ICD-10-CM | POA: Insufficient documentation

## 2017-11-16 DIAGNOSIS — G44319 Acute post-traumatic headache, not intractable: Secondary | ICD-10-CM

## 2017-11-16 DIAGNOSIS — G44309 Post-traumatic headache, unspecified, not intractable: Secondary | ICD-10-CM | POA: Insufficient documentation

## 2017-11-16 DIAGNOSIS — J45909 Unspecified asthma, uncomplicated: Secondary | ICD-10-CM | POA: Insufficient documentation

## 2017-11-16 DIAGNOSIS — F17210 Nicotine dependence, cigarettes, uncomplicated: Secondary | ICD-10-CM | POA: Diagnosis not present

## 2017-11-16 DIAGNOSIS — R51 Headache: Secondary | ICD-10-CM | POA: Diagnosis present

## 2017-11-16 LAB — BASIC METABOLIC PANEL
ANION GAP: 9 (ref 5–15)
BUN: 15 mg/dL (ref 6–20)
CHLORIDE: 104 mmol/L (ref 101–111)
CO2: 25 mmol/L (ref 22–32)
Calcium: 9.1 mg/dL (ref 8.9–10.3)
Creatinine, Ser: 1.43 mg/dL — ABNORMAL HIGH (ref 0.61–1.24)
GFR calc non Af Amer: 58 mL/min — ABNORMAL LOW (ref >60)
Glucose, Bld: 108 mg/dL — ABNORMAL HIGH (ref 65–99)
POTASSIUM: 4.1 mmol/L (ref 3.5–5.1)
SODIUM: 138 mmol/L (ref 135–145)

## 2017-11-16 LAB — CBC
HEMATOCRIT: 41.9 % (ref 39.0–52.0)
Hemoglobin: 13.5 g/dL (ref 13.0–17.0)
MCH: 30.1 pg (ref 26.0–34.0)
MCHC: 32.2 g/dL (ref 30.0–36.0)
MCV: 93.5 fL (ref 78.0–100.0)
Platelets: 305 10*3/uL (ref 150–400)
RBC: 4.48 MIL/uL (ref 4.22–5.81)
RDW: 13.9 % (ref 11.5–15.5)
WBC: 9 10*3/uL (ref 4.0–10.5)

## 2017-11-16 MED ORDER — BUTALBITAL-APAP-CAFFEINE 50-325-40 MG PO TABS: 1.0000 | ORAL_TABLET | Freq: Four times a day (QID) | ORAL | 0 refills | Status: AC | PRN

## 2017-11-16 MED ORDER — DEXAMETHASONE 4 MG PO TABS
10.0000 mg | ORAL_TABLET | Freq: Once | ORAL | Status: AC
  Administered 2017-11-16: 10 mg via ORAL
  Filled 2017-11-16: qty 3

## 2017-11-16 MED ORDER — MECLIZINE HCL 25 MG PO TABS: 25.0000 mg | ORAL_TABLET | Freq: Two times a day (BID) | ORAL | 0 refills | Status: DC | PRN

## 2017-11-16 MED ORDER — ACETAMINOPHEN 500 MG PO TABS
1000.0000 mg | ORAL_TABLET | Freq: Once | ORAL | Status: AC
  Administered 2017-11-16: 1000 mg via ORAL
  Filled 2017-11-16: qty 2

## 2017-11-16 MED ORDER — MECLIZINE HCL 25 MG PO TABS
25.0000 mg | ORAL_TABLET | Freq: Once | ORAL | Status: AC
  Administered 2017-11-16: 25 mg via ORAL
  Filled 2017-11-16: qty 1

## 2017-11-16 MED ORDER — ALBUTEROL SULFATE HFA 108 (90 BASE) MCG/ACT IN AERS
2.0000 | INHALATION_SPRAY | Freq: Once | RESPIRATORY_TRACT | Status: AC
  Administered 2017-11-16: 2 via RESPIRATORY_TRACT
  Filled 2017-11-16: qty 6.7

## 2017-11-16 NOTE — ED Provider Notes (Signed)
[facility] EMERGENCY DEPARTMENT Provider Note   CSN: [id number] Arrival date & time: 11/16/17  0701     History   Chief Complaint Chief Complaint  Patient presents with  . Hypertension  . Headache    HPI Craig Schmidt is a 47 y.o. male.  HPI 47 year old male with past medical history as below here with headache.  The patient was getting out of bed yesterday.  States he actually rolled too far and fell proximally 3 to 4 feet.  He hit his head.  No loss conscious.  Since then, he had a mild, aching, throbbing, frontal headache.  He had some mild nausea and sensitivity to light after the accident as well.  He was taken to the ED by EMS yesterday but left due to long wait time.  Currently, he has a minimal headache.  Denies any further nausea or vomiting.  He does state he occasionally feels somewhat unsteady on his feet.  He has a history of schwannoma and subsequently presents for evaluation.  Is not on blood thinners.  Denies any other injury.  No neck pain or stiffness.  Past Medical History:  Diagnosis Date  . Asthma   . Brain tumor [facility])     Patient Active Problem List   Diagnosis Date Noted  . 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 Tear of MCL (medial collateral ligament) of knee 05/20/2015  . Protein-calorie malnutrition, severe 05/17/2015  . Lactic acidosis 05/15/2015  . Left knee pain 05/15/2015  . Lung nodules 05/15/2015  . Hypoglycemia 05/14/2015  . Hypothermia 05/14/2015  . Acute encephalopathy 05/14/2015  . Cocaine abuse with intoxication ([place]) 05/14/2015  . Alcohol intoxication in active alcoholic ([place]) 93/79/0240  . Sepsis ([place]) 05/14/2015  . Homeless 05/14/2015    History reviewed. No pertinent surgical history.      Home Medications    Prior to Admission medications   Medication Sig Start Date End Date Taking? Authorizing Provider  albuterol (PROVENTIL HFA;VENTOLIN HFA) 108 (90 BASE) MCG/ACT inhaler Inhale 2  puffs into the lungs every 4 (four) hours as needed for wheezing or shortness of breath. 06/03/15  Yes Jola Schmidt, MD  clotrimazole (LOTRIMIN) 1 % cream Apply to affected area 2 times daily 10/20/17  Yes Fransico Meadow, [name]  folic acid (FOLVITE) 1 MG tablet Take 1 tablet (1 mg total) by mouth daily. 05/17/15  Yes Robbie Lis, MD  butalbital-acetaminophen-caffeine [facility]) [phone] MG tablet Take 1-2 tablets by mouth every 6 (six) hours as needed for headache. 11/16/17 11/16/18  Duffy Bruce, MD  doxycycline (VIBRAMYCIN) 100 MG capsule Take 1 capsule (100 mg total) by mouth 2 (two) times daily. Patient not taking: Reported on 11/15/2017 10/20/17   Fransico Meadow, [name]  Fexofenadine HCl (ALLERGY 24-HR PO) Take 1 tablet by mouth as needed (allergy symptoms).    [provider]  meclizine (ANTIVERT) 25 MG tablet Take 1 tablet (25 mg total) by mouth 2 (two) times daily as needed for dizziness. 11/16/17   Duffy Bruce, MD    Family History No family history on file.  Social History Social History   Tobacco Use  . Smoking status: Current Every Day Smoker    Packs/day: 1.50    Types: Cigarettes  . Smokeless tobacco: Never Used  Substance Use Topics  . Alcohol use: Yes  . Drug use: No     Allergies   Patient has no known allergies.   Review of Systems Review of Systems  Constitutional:  Positive for fatigue. Negative for chills and fever.  HENT: Negative for congestion and rhinorrhea.   Eyes: Negative for visual disturbance.  Respiratory: Negative for cough, shortness of breath and wheezing.   Cardiovascular: Negative for chest pain and leg swelling.  Gastrointestinal: Negative for abdominal pain, diarrhea, nausea and vomiting.  Genitourinary: Negative for dysuria and flank pain.  Musculoskeletal: Negative for neck pain and neck stiffness.  Skin: Negative for rash and wound.  Allergic/Immunologic: Negative for immunocompromised state.  Neurological:  Positive for headaches. Negative for syncope and weakness.  All other systems reviewed and are negative.    Physical Exam Updated Vital Signs BP (!) 150/103   Pulse 79   Temp 97.7 F (36.5 C) (Oral)   Resp 18   SpO2 98%   Physical Exam  Constitutional: He is oriented to person, place, and time. He appears well-developed and well-nourished. No distress.  HENT:  Head: Normocephalic and atraumatic.  Eyes: Conjunctivae are normal.  Neck: Neck supple.  Cardiovascular: Normal rate, regular rhythm and normal heart sounds. Exam reveals no friction rub.  No murmur heard. Pulmonary/Chest: Effort normal and breath sounds normal. No respiratory distress. He has no wheezes. He has no rales.  Abdominal: He exhibits no distension.  Musculoskeletal: He exhibits no edema.  Neurological: He is alert and oriented to person, place, and time. He exhibits normal muscle tone. GCS eye subscore is 4. GCS verbal subscore is 5. GCS motor subscore is 6.  Skin: Skin is warm. Capillary refill takes less than 2 seconds.  Psychiatric: He has a normal mood and affect.  Nursing note and vitals reviewed.   Neurological Exam:  Mental Status: Alert and oriented to person, place, and time. Attention and concentration normal. Speech clear. Recent memory is intact. Cranial Nerves: Visual fields grossly intact. EOMI and PERRLA. No nystagmus noted. Facial sensation intact at forehead, maxillary cheek, and chin/mandible bilaterally. No facial asymmetry or weakness. Hearing grossly normal. Uvula is midline, and palate elevates symmetrically. Normal SCM and trapezius strength. Tongue midline without fasciculations. Motor: Muscle strength 5/5 in proximal and distal UE and LE bilaterally. No pronator drift. Muscle tone normal. Reflexes: 2+ and symmetrical in all four extremities.  Sensation: Intact to light touch in upper and lower extremities distally bilaterally.  Gait: Normal without ataxia. Coordination: Normal FTN  bilaterally.   ED Treatments / Results  Labs (all labs ordered are listed, but only abnormal results are displayed) Labs Reviewed  BASIC METABOLIC PANEL - Abnormal; Notable for the following components:      Result Value   Glucose, Bld 108 (*)    Creatinine, Ser 1.43 (*)    GFR calc non Af Amer 58 (*)    All other components within normal limits  CBC    EKG None  Radiology Ct Head Wo Contrast  Result Date: 11/16/2017 CLINICAL DATA:  Head trauma. EXAM: CT HEAD WITHOUT CONTRAST TECHNIQUE: Contiguous axial images were obtained from the base of the skull through the vertex without intravenous contrast. COMPARISON:  CT head dated October 09, 2017. FINDINGS: Brain: Unchanged left vestibular schwannoma at the cerebellopontine angle. No evidence of acute infarction, hemorrhage, hydrocephalus, extra-axial collection or mass effect. Vascular: No hyperdense vessel or unexpected calcification. Skull: Unchanged mild widening of the left internal auditory canal. Negative for fracture or focal lesion. Sinuses/Orbits: No acute finding. Other: None. IMPRESSION: 1.  No acute intracranial abnormality. 2. Unchanged left vestibular schwannoma. Electronically Signed   By: Titus Dubin M.D.   On: 11/16/2017 12:04  Procedures Procedures (including critical care time)  Medications Ordered in ED Medications  acetaminophen (TYLENOL) tablet 1,000 mg (1,000 mg Oral Given 11/16/17 1117)  meclizine (ANTIVERT) tablet 25 mg (25 mg Oral Given 11/16/17 1117)  dexamethasone (DECADRON) tablet 10 mg (10 mg Oral Given 11/16/17 1117)  albuterol (PROVENTIL HFA;VENTOLIN HFA) 108 (90 Base) MCG/ACT inhaler 2 puff (2 puffs Inhalation Given 11/16/17 1117)     Initial Impression / Assessment and Plan / ED Course  I have reviewed the triage vital signs and the nursing notes.  Pertinent labs & imaging results that were available during my care of the patient were reviewed by me and considered in my medical decision making  (see chart for details).    47 year old male with past medical history as above here with mild headache after fall yesterday.  He is neurologically intact.  Suspect mild posttraumatic headache or potentially mild concussion.  CT head shows no acute on normality.  His lab work is at baseline.  He feels better after treatment in the ED.  Will discharge him with concussion precautions and outpatient follow-up.   Final Clinical Impressions(s) / ED Diagnoses   Final diagnoses:  Acute post-traumatic headache, not intractable    ED Discharge Orders        Ordered    meclizine (ANTIVERT) 25 MG tablet  2 times daily PRN     11/16/17 1233    butalbital-acetaminophen-caffeine (FIORICET, ESGIC) 50-325-40 MG tablet  Every 6 hours PRN     11/16/17 1233       Duffy Bruce, MD 11/16/17 1234

## 2017-11-16 NOTE — ED Notes (Signed)
Patient transported to CT 

## 2017-11-16 NOTE — ED Triage Notes (Signed)
Pt. Stated, I have a brain tumor in my brain, I have a headache and HTN. Pain started yesterday

## 2017-11-16 NOTE — ED Triage Notes (Signed)
Pt. Stated I hit my head when I fell out of the bed and hit the back of my head. They said , Im not allowed to hit my head cause of the brain tumor.

## 2017-11-19 ENCOUNTER — Other Ambulatory Visit: Payer: Self-pay

## 2017-11-19 ENCOUNTER — Emergency Department (HOSPITAL_COMMUNITY)
Admission: EM | Admit: 2017-11-19 | Discharge: 2017-11-20 | Disposition: A | Discharge status: Home / Self Care | Payer: BLUE CROSS/BLUE SHIELD | Attending: Emergency Medicine | Admitting: Emergency Medicine

## 2017-11-19 ENCOUNTER — Encounter (HOSPITAL_COMMUNITY): Payer: Self-pay

## 2017-11-19 DIAGNOSIS — F1410 Cocaine abuse, uncomplicated: Secondary | ICD-10-CM | POA: Diagnosis not present

## 2017-11-19 DIAGNOSIS — R079 Chest pain, unspecified: Secondary | ICD-10-CM | POA: Diagnosis present

## 2017-11-19 DIAGNOSIS — Z79899 Other long term (current) drug therapy: Secondary | ICD-10-CM | POA: Diagnosis not present

## 2017-11-19 DIAGNOSIS — J45909 Unspecified asthma, uncomplicated: Secondary | ICD-10-CM | POA: Diagnosis not present

## 2017-11-19 DIAGNOSIS — R51 Headache: Secondary | ICD-10-CM

## 2017-11-19 DIAGNOSIS — F17210 Nicotine dependence, cigarettes, uncomplicated: Secondary | ICD-10-CM | POA: Diagnosis not present

## 2017-11-19 DIAGNOSIS — R519 Headache, unspecified: Secondary | ICD-10-CM

## 2017-11-19 NOTE — ED Triage Notes (Addendum)
Per [facility], pt arrives with complaints of CP and HA starting at 1800 after waking up from a nap. Pt reports CP is generalized burning sensations radiating down left arm. CP was 7/10 and after 324 ASA and 1 NTG from EMS pain is now 5/10. Pt reports headache coming from brain tumor on posterior right side and radiating to front, headache is 10/10. Pt d/c'ed last week for same and diagnosed with indigestion. Pt denies any SOB or N/V.

## 2017-11-20 ENCOUNTER — Emergency Department (HOSPITAL_COMMUNITY): Payer: BLUE CROSS/BLUE SHIELD

## 2017-11-20 LAB — BASIC METABOLIC PANEL
Anion gap: 9 (ref 5–15)
BUN: 11 mg/dL (ref 6–20)
CALCIUM: 8.9 mg/dL (ref 8.9–10.3)
CO2: 25 mmol/L (ref 22–32)
CREATININE: 1.26 mg/dL — AB (ref 0.61–1.24)
Chloride: 103 mmol/L (ref 101–111)
GFR calc Af Amer: >60 mL/min (ref >60)
GLUCOSE: 93 mg/dL (ref 65–99)
Potassium: 3.7 mmol/L (ref 3.5–5.1)
Sodium: 137 mmol/L (ref 135–145)

## 2017-11-20 LAB — CBC
HEMATOCRIT: 39.5 % (ref 39.0–52.0)
HEMOGLOBIN: 13.2 g/dL (ref 13.0–17.0)
MCH: 30.8 pg (ref 26.0–34.0)
MCHC: 33.4 g/dL (ref 30.0–36.0)
MCV: 92.3 fL (ref 78.0–100.0)
Platelets: 256 10*3/uL (ref 150–400)
RBC: 4.28 MIL/uL (ref 4.22–5.81)
RDW: 13.9 % (ref 11.5–15.5)
WBC: 12.7 10*3/uL — ABNORMAL HIGH (ref 4.0–10.5)

## 2017-11-20 LAB — HEPATIC FUNCTION PANEL
ALT: 12 U/L — AB (ref 17–63)
AST: 23 U/L (ref 15–41)
Albumin: 3.4 g/dL — ABNORMAL LOW (ref 3.5–5.0)
Alkaline Phosphatase: 71 U/L (ref 38–126)
BILIRUBIN DIRECT: 0.1 mg/dL (ref 0.1–0.5)
BILIRUBIN TOTAL: 0.4 mg/dL (ref 0.3–1.2)
Indirect Bilirubin: 0.3 mg/dL (ref 0.3–0.9)
Total Protein: 6.6 g/dL (ref 6.5–8.1)

## 2017-11-20 LAB — I-STAT TROPONIN, ED
Troponin i, poc: 0.01 ng/mL (ref 0.00–0.08)
Troponin i, poc: 0.02 ng/mL (ref 0.00–0.08)

## 2017-11-20 LAB — RAPID URINE DRUG SCREEN, HOSP PERFORMED
Amphetamines: NOT DETECTED
BARBITURATES: NOT DETECTED
Benzodiazepines: NOT DETECTED
COCAINE: POSITIVE — AB
Opiates: NOT DETECTED
TETRAHYDROCANNABINOL: NOT DETECTED

## 2017-11-20 LAB — LIPASE, BLOOD: Lipase: 22 U/L (ref 11–51)

## 2017-11-20 MED ORDER — DEXAMETHASONE SODIUM PHOSPHATE 10 MG/ML IJ SOLN
10.0000 mg | Freq: Once | INTRAMUSCULAR | Status: AC
  Administered 2017-11-20: 10 mg via INTRAVENOUS
  Filled 2017-11-20: qty 1

## 2017-11-20 MED ORDER — KETOROLAC TROMETHAMINE 30 MG/ML IJ SOLN
30.0000 mg | Freq: Once | INTRAMUSCULAR | Status: AC
  Administered 2017-11-20: 30 mg via INTRAVENOUS
  Filled 2017-11-20: qty 1

## 2017-11-20 MED ORDER — METOCLOPRAMIDE HCL 5 MG/ML IJ SOLN: 10.0000 mg | Freq: Once | INTRAMUSCULAR | Status: DC

## 2017-11-20 MED ORDER — GI COCKTAIL ~~LOC~~
30.0000 mL | Freq: Once | ORAL | Status: AC
  Administered 2017-11-20: 30 mL via ORAL
  Filled 2017-11-20: qty 30

## 2017-11-20 MED ORDER — METOCLOPRAMIDE HCL 5 MG/ML IJ SOLN
10.0000 mg | Freq: Once | INTRAMUSCULAR | Status: AC
  Administered 2017-11-20: 10 mg via INTRAVENOUS
  Filled 2017-11-20: qty 2

## 2017-11-20 MED ORDER — DEXAMETHASONE SODIUM PHOSPHATE 10 MG/ML IJ SOLN: 10.0000 mg | Freq: Once | INTRAMUSCULAR | Status: DC

## 2017-11-20 MED ORDER — PANTOPRAZOLE SODIUM 20 MG PO TBEC: 20.0000 mg | DELAYED_RELEASE_TABLET | Freq: Every day | ORAL | 0 refills | Status: DC

## 2017-11-20 NOTE — ED Notes (Signed)
ED Provider at bedside. 

## 2017-11-20 NOTE — ED Notes (Signed)
Patient transported to X-ray 

## 2017-11-20 NOTE — Discharge Instructions (Signed)
We recommend that you discontinue use of illicit drugs.  You have been prescribed Protonix to take for suspected stomach irritation which may be contributing to your chest pain.  We do recommend additional follow-up with a primary care doctor and cardiologist for further evaluation of your symptoms.  Continue with Fioricet as previously prescribed for headache management.  You may return to the emergency department for new or concerning symptoms.

## 2017-11-20 NOTE — ED Provider Notes (Signed)
[facility]ERG[facility] DEPARTMENT Provider Note   CSN: [id number] Arrival date & time: 11/19/17  2335     History   Chief Complaint Chief Complaint  Patient presents with  . Chest Pain  . Headache    HPI Craig Schmidt is a 47 y.o. male.   47 year old male with a history of asthma and left vestibular schwannoma presents to the emergency department for multiple complaints.  He states that he has had a persistent headache to his right temple which she has experienced previously.  He attributes his headache to his known schwannoma.  This is a similar pain to what the patient experienced when last seen on 11/16/2017.  Patient had a reassuring CT scan of his brain during this visit.  He has not taken any additional medications for his headache.  His chest pain began this afternoon after waking from a nap.  He describes his pain to be located in the center of his chest with burning sensations.  Pain reported to radiate towards his left arm.  He was given 324 mg aspirin and 1 sublingual nitroglycerin by EMS with mild improvement to his chest pain.  Chest pain has not been associated with lightheadedness, syncope or near syncope, recent fevers, nausea, vomiting, shortness of breath, diaphoresis.  He reports crack cocaine use yesterday.  No prior history of ACS.     Past Medical History:  Diagnosis Date  . Asthma   . Brain tumor [facility])     Patient Active Problem List   Diagnosis Date Noted  . 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[place]) 05/20/2015  . Tear of MCL (medial collateral ligament) of knee 05/20/2015  . Protein-calorie malnutrition, severe 05/17/2015  . Lactic acidosis 05/15/2015  . Left knee pain 05/15/2015  . Lung nodules 05/15/2015  . Hypoglycemia 05/14/2015  . Hypothermia 05/14/2015  . Acute encephalopathy 05/14/2015  . Cocaine abuse with intoxication ([place]) 05/14/2015  . Alcohol intoxication in active alcoholic ([place]) 82/50/5397  . Sepsis ([place])  05/14/2015  . Homeless 05/14/2015    History reviewed. No pertinent surgical history.      Home Medications    Prior to Admission medications   Medication Sig Start Date End Date Taking? Authorizing Provider  albuterol (PROVENTIL HFA;VENTOLIN HFA) 108 (90 BASE) MCG/ACT inhaler Inhale 2 puffs into the lungs every 4 (four) hours as needed for wheezing or shortness of breath. 06/03/15  Yes Jola Schmidt, MD  butalbital-acetaminophen-caffeine (FIORICET, ESGIC) [phone] MG tablet Take 1-2 tablets by mouth every 6 (six) hours as needed for headache. 11/16/17 11/16/18 Yes Duffy Bruce, MD  Fexofenadine HCl (ALLERGY 24-HR PO) Take 1 tablet by mouth daily as needed (allergy symptoms).    Yes [provider]  meclizine (ANTIVERT) 25 MG tablet Take 1 tablet (25 mg total) by mouth 2 (two) times daily as needed for dizziness. 11/16/17  Yes Duffy Bruce, MD  folic acid (FOLVITE) 1 MG tablet Take 1 tablet (1 mg total) by mouth daily. Patient not taking: Reported on 11/20/2017 05/17/15   Robbie Lis, MD  pantoprazole (PROTONIX) 20 MG tablet Take 1 tablet (20 mg total) by mouth daily. 11/20/17   Antonietta Breach, [name]    Family History No family history on file.  Social History Social History   Tobacco Use  . Smoking status: Current Every Day Smoker    Packs/day: 1.50    Types: Cigarettes  . Smokeless tobacco: Never Used  Substance Use Topics  . Alcohol use: Yes  . Drug use: No  Allergies   Patient has no known allergies.   Review of Systems Review of Systems Ten systems reviewed and are negative for acute change, except as noted in the HPI.    Physical Exam Updated Vital Signs BP (!) 144/98   Pulse 75   Temp 97.9 F (36.6 C) (Oral)   Resp 16   Ht 5\' 5"  (1.651 m)   Wt 113.4 kg (250 lb)   SpO2 96%   BMI 41.60 kg/m   Physical Exam  Constitutional: He is oriented to person, place, and time. He appears well-developed and well-nourished. No distress.  Disheveled.  Nontoxic.  HENT:  Head: Normocephalic and atraumatic.  Mouth/Throat: Oropharynx is clear and moist.  Eyes: Conjunctivae and EOM are normal. No scleral icterus.  Neck: Normal range of motion.  Cardiovascular: Normal rate, regular rhythm and intact distal pulses.  Pulmonary/Chest: Effort normal. No respiratory distress.  Respirations even and unlabored. Lungs CTAB.  Musculoskeletal: Normal range of motion.  Neurological: He is alert and oriented to person, place, and time. No cranial nerve deficit. He exhibits normal muscle tone. Coordination normal.  GCS 15. Speech is goal oriented. No cranial nerve deficits appreciated; symmetric eyebrow raise, no facial drooping. Patient moving all extremities spontaneously. No focal deficits noted.   Skin: Skin is warm and dry. No rash noted. He is not diaphoretic. No erythema. No pallor.  Psychiatric: He has a normal mood and affect. His behavior is normal.  Nursing note and vitals reviewed.    ED Treatments / Results  Labs (all labs ordered are listed, but only abnormal results are displayed) Labs Reviewed  BASIC METABOLIC PANEL - Abnormal; Notable for the following components:      Result Value   Creatinine, Ser 1.26 (*)    All other components within normal limits  CBC - Abnormal; Notable for the following components:   WBC 12.7 (*)    All other components within normal limits  HEPATIC FUNCTION PANEL - Abnormal; Notable for the following components:   Albumin 3.4 (*)    ALT 12 (*)    All other components within normal limits  RAPID URINE DRUG SCREEN, HOSP PERFORMED - Abnormal; Notable for the following components:   Cocaine POSITIVE (*)    All other components within normal limits  LIPASE, BLOOD  I-STAT TROPONIN, ED  I-STAT TROPONIN, ED    EKG EKG Interpretation  Date/Time:  Tuesday November 19 2017 23:39:21 EDT Ventricular Rate:  90 PR Interval:    QRS Duration: 78 QT Interval:  390 QTC Calculation: 478 R Axis:   7 Text  Interpretation:  Sinus rhythm Probable left atrial enlargement Borderline T wave abnormalities Borderline prolonged QT interval No significant change since last tracing Confirmed by Pryor Curia [phone]) on 11/19/2017 11:56:15 PM Also confirmed by Pryor Curia [phone]), editor Hattie Perch ([id number])  on 11/20/2017 6:56:09 AM   Radiology Dg Chest 2 View  Result Date: 11/20/2017 CLINICAL DATA:  Chest pain since 6 p.m. EXAM: CHEST - 2 VIEW COMPARISON:  11/11/2017 FINDINGS: The heart size and mediastinal contours are within normal limits. Both lungs are clear. The visualized skeletal structures are unremarkable. IMPRESSION: No active cardiopulmonary disease. Electronically Signed   By: Lucienne Capers M.D.   On: 11/20/2017 00:26   Dg Chest 2 View  Result Date: 11/11/2017 CLINICAL DATA:  Acute encephalopathy, substance abuse, sepsis, chest pain. EXAM: CHEST - 2 VIEW COMPARISON:  Chest x-ray of June 03, 2015 FINDINGS: The lungs are adequately inflated. The interstitial markings are coarse  though stable. There is no alveolar infiltrate or pleural effusion. The heart is top-normal in size. The pulmonary vascularity is normal. The bony thorax exhibits no acute abnormality. There is mild dextrocurvature centered in the lower thoracic spine which is stable. IMPRESSION: Mild chronic interstitial prominence likely reflects the patient's smoking history as well as history of asthma. No pneumonia nor CHF. Electronically Signed   By: David  Martinique M.D.   On: 11/11/2017 07:26   Ct Head Wo Contrast  Result Date: 11/16/2017 CLINICAL DATA:  Head trauma. EXAM: CT HEAD WITHOUT CONTRAST TECHNIQUE: Contiguous axial images were obtained from the base of the skull through the vertex without intravenous contrast. COMPARISON:  CT head dated October 09, 2017. FINDINGS: Brain: Unchanged left vestibular schwannoma at the cerebellopontine angle. No evidence of acute infarction, hemorrhage, hydrocephalus, extra-axial collection or  mass effect. Vascular: No hyperdense vessel or unexpected calcification. Skull: Unchanged mild widening of the left internal auditory canal. Negative for fracture or focal lesion. Sinuses/Orbits: No acute finding. Other: None. IMPRESSION: 1.  No acute intracranial abnormality. 2. Unchanged left vestibular schwannoma. Electronically Signed   By: Titus Dubin M.D.   On: 11/16/2017 12:04    Procedures Procedures (including critical care time)  Medications Ordered in ED Medications  dexamethasone (DECADRON) injection 10 mg (10 mg Intravenous Given 11/20/17 0308)  metoCLOPramide (REGLAN) injection 10 mg (10 mg Intravenous Given 11/20/17 0308)  ketorolac (TORADOL) 30 MG/ML injection 30 mg (30 mg Intravenous Given 11/20/17 0307)  gi cocktail (Maalox,Lidocaine,Donnatal) (30 mLs Oral Given 11/20/17 0523)    5:51 AM Reports lessening of chest pain from 5/10 down to 3/10 after receiving a GI cocktail.   Initial Impression / Assessment and Plan / ED Course  I have reviewed the triage vital signs and the nursing notes.  Pertinent labs & imaging results that were available during my care of the patient were reviewed by me and considered in my medical decision making (see chart for details).     Patient presents to the emergency department for evaluation of headache which patient has experienced previously. Last seen in ED for headache complaints 3 days ago. He attributes his pain to his schwannoma which has been known for many years; unchanged on recent imaging.  Patient with no history of recent head injury or trauma. No fever, nuchal rigidity, meningismus to suggest meningitis. Neurologic exam today is nonfocal. On reassessment, the patient has had significant improvement in headache symptoms following a migraine cocktail. I do not believe further emergent workup is indicated at this time.   Patient also with c/o chest pain. Patient initially given ASA and NTG by EMS with some improvement, but later  reports further chest pain improvement with GI cocktail. Chest pain noted in the setting of recent cocaine use. Low suspicion for emergent cardiac etiology given reassuring workup today. EKG is nonischemic and troponin negative x 2. Patient has a heart score of 2-3 (depending on level of suspicion) consistent with low risk of acute coronary event. Chest x-ray without evidence of mediastinal widening to suggest dissection. No pneumothorax, pneumonia, pleural effusion. Pulmonary embolus further considered; however, patient without tachycardia, tachypnea, dyspnea, hypoxia.  Patient is PERC negative.  The patient has been monitored for several hours in the emergency department without clinical decompensation.  I believe he is appropriate for outpatient follow-up with his primary care doctor.  Have discussed the need to discontinue crack cocaine use.  Will discharge with Protonix as patient with some chest pain improvement following GI cocktail.  He  has been instructed to continue Fioricet as needed for headache.  This was last prescribed on 11/16/2017.  Return precautions discussed and provided. Patient discharged in stable condition with no unaddressed concerns.   Vitals:   11/20/17 0245 11/20/17 0315 11/20/17 0400 11/20/17 0515  BP: (!) 136/95 133/80 (!) 141/98 (!) 144/98  Pulse: 82 87 78 75  Resp: 17 19  16   Temp:      TempSrc:      SpO2: 97% 91% 98% 96%  Weight:      Height:        Final Clinical Impressions(s) / ED Diagnoses   Final diagnoses:  Chest pain, unspecified type  Bad headache  Cocaine abuse [facility])    ED Discharge Orders        Ordered    pantoprazole (PROTONIX) 20 MG tablet  Daily     11/20/17 0549       Antonietta Breach, [name] 11/22/17 0827    Ward, Delice Bison, DO 11/22/17 2303

## 2018-10-03 ENCOUNTER — Emergency Department (HOSPITAL_COMMUNITY): Payer: Self-pay

## 2018-10-03 ENCOUNTER — Other Ambulatory Visit: Payer: Self-pay

## 2018-10-03 ENCOUNTER — Encounter (HOSPITAL_COMMUNITY): Payer: Self-pay | Admitting: Emergency Medicine

## 2018-10-03 ENCOUNTER — Emergency Department (HOSPITAL_COMMUNITY)
Admission: EM | Admit: 2018-10-03 | Discharge: 2018-10-03 | Disposition: A | Discharge status: Home / Self Care | Payer: Self-pay | Attending: Emergency Medicine | Admitting: Emergency Medicine

## 2018-10-03 DIAGNOSIS — W51XXXA Accidental striking against or bumped into by another person, initial encounter: Secondary | ICD-10-CM | POA: Insufficient documentation

## 2018-10-03 DIAGNOSIS — J45909 Unspecified asthma, uncomplicated: Secondary | ICD-10-CM | POA: Insufficient documentation

## 2018-10-03 DIAGNOSIS — R51 Headache: Secondary | ICD-10-CM | POA: Insufficient documentation

## 2018-10-03 DIAGNOSIS — Y929 Unspecified place or not applicable: Secondary | ICD-10-CM | POA: Insufficient documentation

## 2018-10-03 DIAGNOSIS — Z91010 Allergy to peanuts: Secondary | ICD-10-CM | POA: Insufficient documentation

## 2018-10-03 DIAGNOSIS — Y939 Activity, unspecified: Secondary | ICD-10-CM | POA: Insufficient documentation

## 2018-10-03 DIAGNOSIS — E860 Dehydration: Secondary | ICD-10-CM | POA: Insufficient documentation

## 2018-10-03 DIAGNOSIS — R519 Headache, unspecified: Secondary | ICD-10-CM

## 2018-10-03 DIAGNOSIS — F17210 Nicotine dependence, cigarettes, uncomplicated: Secondary | ICD-10-CM | POA: Insufficient documentation

## 2018-10-03 DIAGNOSIS — S60221A Contusion of right hand, initial encounter: Secondary | ICD-10-CM | POA: Insufficient documentation

## 2018-10-03 DIAGNOSIS — Y998 Other external cause status: Secondary | ICD-10-CM | POA: Insufficient documentation

## 2018-10-03 DIAGNOSIS — J069 Acute upper respiratory infection, unspecified: Secondary | ICD-10-CM | POA: Insufficient documentation

## 2018-10-03 DIAGNOSIS — N390 Urinary tract infection, site not specified: Secondary | ICD-10-CM | POA: Insufficient documentation

## 2018-10-03 LAB — CBC
HCT: 40 % (ref 39.0–52.0)
Hemoglobin: 13 g/dL (ref 13.0–17.0)
MCH: 30.5 pg (ref 26.0–34.0)
MCHC: 32.5 g/dL (ref 30.0–36.0)
MCV: 93.9 fL (ref 80.0–100.0)
NRBC: 0 % (ref 0.0–0.2)
PLATELETS: 260 10*3/uL (ref 150–400)
RBC: 4.26 MIL/uL (ref 4.22–5.81)
RDW: 14.5 % (ref 11.5–15.5)
WBC: 7.9 10*3/uL (ref 4.0–10.5)

## 2018-10-03 LAB — URINALYSIS, ROUTINE W REFLEX MICROSCOPIC
Bilirubin Urine: NEGATIVE
GLUCOSE, UA: NEGATIVE mg/dL
Hgb urine dipstick: NEGATIVE
KETONES UR: NEGATIVE mg/dL
Nitrite: NEGATIVE
PROTEIN: NEGATIVE mg/dL
Specific Gravity, Urine: 1.029 (ref 1.005–1.030)
pH: 5 (ref 5.0–8.0)

## 2018-10-03 LAB — BASIC METABOLIC PANEL
ANION GAP: 5 (ref 5–15)
BUN: 24 mg/dL — ABNORMAL HIGH (ref 6–20)
CO2: 25 mmol/L (ref 22–32)
Calcium: 9 mg/dL (ref 8.9–10.3)
Chloride: 107 mmol/L (ref 98–111)
Creatinine, Ser: 1.77 mg/dL — ABNORMAL HIGH (ref 0.61–1.24)
GFR calc Af Amer: 52 mL/min — ABNORMAL LOW (ref >60)
GFR calc non Af Amer: 45 mL/min — ABNORMAL LOW (ref >60)
Glucose, Bld: 85 mg/dL (ref 70–99)
POTASSIUM: 4 mmol/L (ref 3.5–5.1)
Sodium: 137 mmol/L (ref 135–145)

## 2018-10-03 MED ORDER — CEPHALEXIN 250 MG PO CAPS
500.0000 mg | ORAL_CAPSULE | Freq: Once | ORAL | Status: AC
  Administered 2018-10-03: 500 mg via ORAL
  Filled 2018-10-03: qty 2

## 2018-10-03 MED ORDER — CEPHALEXIN 500 MG PO CAPS: 500.0000 mg | ORAL_CAPSULE | Freq: Two times a day (BID) | ORAL | 0 refills | Status: DC

## 2018-10-03 MED ORDER — METOCLOPRAMIDE HCL 5 MG/ML IJ SOLN
10.0000 mg | Freq: Once | INTRAMUSCULAR | Status: AC
  Administered 2018-10-03: 10 mg via INTRAVENOUS
  Filled 2018-10-03: qty 2

## 2018-10-03 MED ORDER — SODIUM CHLORIDE 0.9 % IV BOLUS
500.0000 mL | Freq: Once | INTRAVENOUS | Status: AC
  Administered 2018-10-03: 500 mL via INTRAVENOUS

## 2018-10-03 NOTE — Discharge Instructions (Addendum)
You are dehydrated today. Please drink plenty of fluids. Do not take ibuprofen or Aleve. Please follow-up with your family doctor to have your kidney function rechecked.   You also have a viral respiratory infection. These are contagious. Please cover your cough and wash your hands carefully with soap and water or use hand sanitizers.   You have a urinary tract infection, this will be treated with antibiotics, please take as prescribed.  Get rechecked immediately if you have any new or concerning symptoms.

## 2018-10-03 NOTE — ED Notes (Signed)
RN states she will draw the Labs.
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
## 2018-10-03 NOTE — ED Notes (Signed)
Nurse will collect labs. 

## 2018-10-04 LAB — HIV ANTIBODY (ROUTINE TESTING W REFLEX): HIV SCREEN 4TH GENERATION: NONREACTIVE

## 2018-10-04 LAB — RPR: RPR Ser Ql: NONREACTIVE

## 2018-10-06 LAB — GC/CHLAMYDIA PROBE AMP (~~LOC~~) NOT AT ARMC
Chlamydia: NEGATIVE
Neisseria Gonorrhea: NEGATIVE

## 2019-08-01 ENCOUNTER — Other Ambulatory Visit: Payer: Self-pay

## 2019-08-01 DIAGNOSIS — Z20822 Contact with and (suspected) exposure to covid-19: Secondary | ICD-10-CM

## 2019-08-02 LAB — NOVEL CORONAVIRUS, NAA: SARS-CoV-2, NAA: NOT DETECTED

## 2021-09-08 ENCOUNTER — Inpatient Hospital Stay (HOSPITAL_COMMUNITY): Payer: Medicaid Other

## 2021-09-08 ENCOUNTER — Emergency Department (HOSPITAL_COMMUNITY): Payer: Medicaid Other

## 2021-09-08 ENCOUNTER — Other Ambulatory Visit: Payer: Self-pay

## 2021-09-08 ENCOUNTER — Encounter (HOSPITAL_COMMUNITY): Payer: Self-pay | Admitting: Student in an Organized Health Care Education/Training Program

## 2021-09-08 ENCOUNTER — Inpatient Hospital Stay (HOSPITAL_COMMUNITY)
Admission: EM | Admit: 2021-09-08 | Discharge: 2021-10-03 | DRG: 917 | Disposition: A | Discharge status: Home / Self Care | Payer: Medicaid Other | Attending: Neurology | Admitting: Neurology

## 2021-09-08 DIAGNOSIS — H6091 Unspecified otitis externa, right ear: Secondary | ICD-10-CM | POA: Diagnosis not present

## 2021-09-08 DIAGNOSIS — Z8673 Personal history of transient ischemic attack (TIA), and cerebral infarction without residual deficits: Secondary | ICD-10-CM | POA: Diagnosis not present

## 2021-09-08 DIAGNOSIS — I420 Dilated cardiomyopathy: Secondary | ICD-10-CM | POA: Diagnosis present

## 2021-09-08 DIAGNOSIS — I5021 Acute systolic (congestive) heart failure: Secondary | ICD-10-CM | POA: Diagnosis present

## 2021-09-08 DIAGNOSIS — R29722 NIHSS score 22: Secondary | ICD-10-CM | POA: Diagnosis present

## 2021-09-08 DIAGNOSIS — N1831 Chronic kidney disease, stage 3a: Secondary | ICD-10-CM | POA: Diagnosis present

## 2021-09-08 DIAGNOSIS — D333 Benign neoplasm of cranial nerves: Secondary | ICD-10-CM | POA: Diagnosis present

## 2021-09-08 DIAGNOSIS — E785 Hyperlipidemia, unspecified: Secondary | ICD-10-CM | POA: Diagnosis present

## 2021-09-08 DIAGNOSIS — R4781 Slurred speech: Secondary | ICD-10-CM | POA: Diagnosis present

## 2021-09-08 DIAGNOSIS — I255 Ischemic cardiomyopathy: Secondary | ICD-10-CM

## 2021-09-08 DIAGNOSIS — I161 Hypertensive emergency: Secondary | ICD-10-CM | POA: Diagnosis present

## 2021-09-08 DIAGNOSIS — F1410 Cocaine abuse, uncomplicated: Secondary | ICD-10-CM | POA: Diagnosis not present

## 2021-09-08 DIAGNOSIS — Z91013 Allergy to seafood: Secondary | ICD-10-CM

## 2021-09-08 DIAGNOSIS — I6389 Other cerebral infarction: Secondary | ICD-10-CM | POA: Diagnosis not present

## 2021-09-08 DIAGNOSIS — R7989 Other specified abnormal findings of blood chemistry: Secondary | ICD-10-CM | POA: Diagnosis not present

## 2021-09-08 DIAGNOSIS — Z20822 Contact with and (suspected) exposure to covid-19: Secondary | ICD-10-CM | POA: Diagnosis present

## 2021-09-08 DIAGNOSIS — G8191 Hemiplegia, unspecified affecting right dominant side: Secondary | ICD-10-CM | POA: Diagnosis present

## 2021-09-08 DIAGNOSIS — I428 Other cardiomyopathies: Secondary | ICD-10-CM | POA: Diagnosis not present

## 2021-09-08 DIAGNOSIS — I63412 Cerebral infarction due to embolism of left middle cerebral artery: Secondary | ICD-10-CM | POA: Diagnosis present

## 2021-09-08 DIAGNOSIS — I63512 Cerebral infarction due to unspecified occlusion or stenosis of left middle cerebral artery: Secondary | ICD-10-CM | POA: Diagnosis not present

## 2021-09-08 DIAGNOSIS — N289 Disorder of kidney and ureter, unspecified: Secondary | ICD-10-CM | POA: Diagnosis not present

## 2021-09-08 DIAGNOSIS — R29810 Facial weakness: Secondary | ICD-10-CM | POA: Diagnosis present

## 2021-09-08 DIAGNOSIS — T405X1A Poisoning by cocaine, accidental (unintentional), initial encounter: Principal | ICD-10-CM | POA: Diagnosis present

## 2021-09-08 DIAGNOSIS — Z5902 Unsheltered homelessness: Secondary | ICD-10-CM

## 2021-09-08 DIAGNOSIS — Z79899 Other long term (current) drug therapy: Secondary | ICD-10-CM | POA: Diagnosis not present

## 2021-09-08 DIAGNOSIS — J45909 Unspecified asthma, uncomplicated: Secondary | ICD-10-CM | POA: Diagnosis present

## 2021-09-08 DIAGNOSIS — N179 Acute kidney failure, unspecified: Secondary | ICD-10-CM | POA: Diagnosis not present

## 2021-09-08 DIAGNOSIS — I639 Cerebral infarction, unspecified: Secondary | ICD-10-CM | POA: Diagnosis present

## 2021-09-08 DIAGNOSIS — I130 Hypertensive heart and chronic kidney disease with heart failure and stage 1 through stage 4 chronic kidney disease, or unspecified chronic kidney disease: Secondary | ICD-10-CM | POA: Diagnosis present

## 2021-09-08 DIAGNOSIS — F17200 Nicotine dependence, unspecified, uncomplicated: Secondary | ICD-10-CM | POA: Diagnosis not present

## 2021-09-08 DIAGNOSIS — I10 Essential (primary) hypertension: Secondary | ICD-10-CM | POA: Diagnosis not present

## 2021-09-08 DIAGNOSIS — F17210 Nicotine dependence, cigarettes, uncomplicated: Secondary | ICD-10-CM | POA: Diagnosis not present

## 2021-09-08 DIAGNOSIS — I081 Rheumatic disorders of both mitral and tricuspid valves: Secondary | ICD-10-CM | POA: Diagnosis not present

## 2021-09-08 DIAGNOSIS — I050 Rheumatic mitral stenosis: Secondary | ICD-10-CM | POA: Diagnosis present

## 2021-09-08 DIAGNOSIS — R0789 Other chest pain: Secondary | ICD-10-CM | POA: Diagnosis not present

## 2021-09-08 DIAGNOSIS — F1010 Alcohol abuse, uncomplicated: Secondary | ICD-10-CM | POA: Diagnosis not present

## 2021-09-08 DIAGNOSIS — I052 Rheumatic mitral stenosis with insufficiency: Secondary | ICD-10-CM | POA: Diagnosis not present

## 2021-09-08 DIAGNOSIS — E7800 Pure hypercholesterolemia, unspecified: Secondary | ICD-10-CM | POA: Diagnosis not present

## 2021-09-08 DIAGNOSIS — I6990 Unspecified sequelae of unspecified cerebrovascular disease: Secondary | ICD-10-CM | POA: Diagnosis not present

## 2021-09-08 DIAGNOSIS — Z91010 Allergy to peanuts: Secondary | ICD-10-CM | POA: Diagnosis not present

## 2021-09-08 DIAGNOSIS — I69351 Hemiplegia and hemiparesis following cerebral infarction affecting right dominant side: Secondary | ICD-10-CM | POA: Diagnosis not present

## 2021-09-08 DIAGNOSIS — F1490 Cocaine use, unspecified, uncomplicated: Secondary | ICD-10-CM | POA: Diagnosis not present

## 2021-09-08 DIAGNOSIS — M79673 Pain in unspecified foot: Secondary | ICD-10-CM | POA: Diagnosis not present

## 2021-09-08 DIAGNOSIS — M26609 Unspecified temporomandibular joint disorder, unspecified side: Secondary | ICD-10-CM | POA: Diagnosis not present

## 2021-09-08 DIAGNOSIS — I959 Hypotension, unspecified: Secondary | ICD-10-CM | POA: Diagnosis not present

## 2021-09-08 DIAGNOSIS — I340 Nonrheumatic mitral (valve) insufficiency: Secondary | ICD-10-CM | POA: Diagnosis not present

## 2021-09-08 DIAGNOSIS — H60391 Other infective otitis externa, right ear: Secondary | ICD-10-CM | POA: Diagnosis not present

## 2021-09-08 DIAGNOSIS — Z1389 Encounter for screening for other disorder: Secondary | ICD-10-CM

## 2021-09-08 DIAGNOSIS — H9201 Otalgia, right ear: Secondary | ICD-10-CM | POA: Diagnosis not present

## 2021-09-08 DIAGNOSIS — D649 Anemia, unspecified: Secondary | ICD-10-CM | POA: Diagnosis not present

## 2021-09-08 LAB — COMPREHENSIVE METABOLIC PANEL
ALT: 44 U/L (ref 0–44)
AST: 59 U/L — ABNORMAL HIGH (ref 15–41)
Albumin: 2.9 g/dL — ABNORMAL LOW (ref 3.5–5.0)
Alkaline Phosphatase: 132 U/L — ABNORMAL HIGH (ref 38–126)
Anion gap: 10 (ref 5–15)
BUN: 19 mg/dL (ref 6–20)
CO2: 22 mmol/L (ref 22–32)
Calcium: 8.8 mg/dL — ABNORMAL LOW (ref 8.9–10.3)
Chloride: 107 mmol/L (ref 98–111)
Creatinine, Ser: 1.95 mg/dL — ABNORMAL HIGH (ref 0.61–1.24)
GFR, Estimated: 41 mL/min — ABNORMAL LOW (ref >60)
Glucose, Bld: 75 mg/dL (ref 70–99)
Potassium: 3.9 mmol/L (ref 3.5–5.1)
Sodium: 139 mmol/L (ref 135–145)
Total Bilirubin: 0.7 mg/dL (ref 0.3–1.2)
Total Protein: 6.3 g/dL — ABNORMAL LOW (ref 6.5–8.1)

## 2021-09-08 LAB — URINALYSIS, ROUTINE W REFLEX MICROSCOPIC
Bilirubin Urine: NEGATIVE
Glucose, UA: NEGATIVE mg/dL
Hgb urine dipstick: NEGATIVE
Ketones, ur: NEGATIVE mg/dL
Leukocytes,Ua: NEGATIVE
Nitrite: NEGATIVE
Protein, ur: 30 mg/dL — AB
Specific Gravity, Urine: 1.03 (ref 1.005–1.030)
pH: 5 (ref 5.0–8.0)

## 2021-09-08 LAB — MRSA NEXT GEN BY PCR, NASAL: MRSA by PCR Next Gen: NOT DETECTED

## 2021-09-08 LAB — CBC
HCT: 38.9 % — ABNORMAL LOW (ref 39.0–52.0)
Hemoglobin: 12.2 g/dL — ABNORMAL LOW (ref 13.0–17.0)
MCH: 29.6 pg (ref 26.0–34.0)
MCHC: 31.4 g/dL (ref 30.0–36.0)
MCV: 94.4 fL (ref 80.0–100.0)
Platelets: 242 10*3/uL (ref 150–400)
RBC: 4.12 MIL/uL — ABNORMAL LOW (ref 4.22–5.81)
RDW: 14.1 % (ref 11.5–15.5)
WBC: 10.4 10*3/uL (ref 4.0–10.5)
nRBC: 0 % (ref 0.0–0.2)

## 2021-09-08 LAB — I-STAT CHEM 8, ED
BUN: 27 mg/dL — ABNORMAL HIGH (ref 6–20)
Calcium, Ion: 1.1 mmol/L — ABNORMAL LOW (ref 1.15–1.40)
Chloride: 106 mmol/L (ref 98–111)
Creatinine, Ser: 1.9 mg/dL — ABNORMAL HIGH (ref 0.61–1.24)
Glucose, Bld: 72 mg/dL (ref 70–99)
HCT: 42 % (ref 39.0–52.0)
Hemoglobin: 14.3 g/dL (ref 13.0–17.0)
Potassium: 4.1 mmol/L (ref 3.5–5.1)
Sodium: 141 mmol/L (ref 135–145)
TCO2: 24 mmol/L (ref 22–32)

## 2021-09-08 LAB — ECHOCARDIOGRAM COMPLETE
AR max vel: 2.62 cm2
AV Area VTI: 2.48 cm2
AV Area mean vel: 2.49 cm2
AV Mean grad: 3 mmHg
AV Peak grad: 4.6 mmHg
Ao pk vel: 1.07 m/s
Area-P 1/2: 3.99 cm2
Height: 65 in
S' Lateral: 4.3 cm
Weight: 2821.89 oz

## 2021-09-08 LAB — CBG MONITORING, ED: Glucose-Capillary: 75 mg/dL (ref 70–99)

## 2021-09-08 LAB — HIV ANTIBODY (ROUTINE TESTING W REFLEX): HIV Screen 4th Generation wRfx: NONREACTIVE

## 2021-09-08 LAB — APTT: aPTT: 27 seconds (ref 24–36)

## 2021-09-08 LAB — DIFFERENTIAL
Abs Immature Granulocytes: 0.04 10*3/uL (ref 0.00–0.07)
Basophils Absolute: 0 10*3/uL (ref 0.0–0.1)
Basophils Relative: 0 %
Eosinophils Absolute: 0.3 10*3/uL (ref 0.0–0.5)
Eosinophils Relative: 2 %
Immature Granulocytes: 0 %
Lymphocytes Relative: 17 %
Lymphs Abs: 1.8 10*3/uL (ref 0.7–4.0)
Monocytes Absolute: 0.7 10*3/uL (ref 0.1–1.0)
Monocytes Relative: 7 %
Neutro Abs: 7.6 10*3/uL (ref 1.7–7.7)
Neutrophils Relative %: 74 %

## 2021-09-08 LAB — RAPID URINE DRUG SCREEN, HOSP PERFORMED
Amphetamines: NOT DETECTED
Barbiturates: NOT DETECTED
Benzodiazepines: NOT DETECTED
Cocaine: POSITIVE — AB
Opiates: NOT DETECTED
Tetrahydrocannabinol: NOT DETECTED

## 2021-09-08 LAB — PROTIME-INR
INR: 1.2 (ref 0.8–1.2)
Prothrombin Time: 14.7 seconds (ref 11.4–15.2)

## 2021-09-08 LAB — ETHANOL: Alcohol, Ethyl (B): <10 mg/dL (ref <10)

## 2021-09-08 LAB — TROPONIN I (HIGH SENSITIVITY)
Troponin I (High Sensitivity): 406 ng/L (ref <18)
Troponin I (High Sensitivity): 312 ng/L (ref <18)

## 2021-09-08 LAB — RESP PANEL BY RT-PCR (FLU A&B, COVID) ARPGX2
Influenza A by PCR: NEGATIVE
Influenza B by PCR: NEGATIVE
SARS Coronavirus 2 by RT PCR: NEGATIVE

## 2021-09-08 MED ORDER — IOHEXOL 350 MG/ML SOLN
75.0000 mL | Freq: Once | INTRAVENOUS | Status: AC | PRN
  Administered 2021-09-08: 75 mL via INTRAVENOUS

## 2021-09-08 MED ORDER — TENECTEPLASE FOR STROKE
0.2500 mg/kg | PACK | Freq: Once | INTRAVENOUS | Status: AC
  Administered 2021-09-08: 20 mg via INTRAVENOUS
  Filled 2021-09-08: qty 10

## 2021-09-08 MED ORDER — PANTOPRAZOLE SODIUM 40 MG IV SOLR
40.0000 mg | Freq: Every day | INTRAVENOUS | Status: DC
  Administered 2021-09-09: 40 mg via INTRAVENOUS
  Filled 2021-09-08: qty 10

## 2021-09-08 MED ORDER — STROKE: EARLY STAGES OF RECOVERY BOOK: Freq: Once | Status: AC

## 2021-09-08 MED ORDER — SODIUM CHLORIDE 0.9% FLUSH: 3.0000 mL | Freq: Once | INTRAVENOUS | Status: DC

## 2021-09-08 MED ORDER — SENNOSIDES-DOCUSATE SODIUM 8.6-50 MG PO TABS
1.0000 | ORAL_TABLET | Freq: Every evening | ORAL | Status: DC | PRN
  Administered 2021-09-24: 1 via ORAL
  Filled 2021-09-08: qty 1

## 2021-09-08 MED ORDER — SODIUM CHLORIDE 0.9 % IV SOLN: INTRAVENOUS | Status: DC

## 2021-09-08 MED ORDER — CHLORHEXIDINE GLUCONATE CLOTH 2 % EX PADS
6.0000 | MEDICATED_PAD | Freq: Every day | CUTANEOUS | Status: DC
  Administered 2021-09-08 – 2021-09-09 (×3): 6 via TOPICAL

## 2021-09-08 MED ORDER — ACETAMINOPHEN 325 MG PO TABS
650.0000 mg | ORAL_TABLET | ORAL | Status: DC | PRN
  Administered 2021-09-10 – 2021-09-26 (×10): 650 mg via ORAL
  Filled 2021-09-08 (×11): qty 2

## 2021-09-08 MED ORDER — ACETAMINOPHEN 650 MG RE SUPP: 650.0000 mg | RECTAL | Status: DC | PRN

## 2021-09-08 MED ORDER — ACETAMINOPHEN 160 MG/5ML PO SOLN: 650.0000 mg | ORAL | Status: DC | PRN

## 2021-09-08 MED ORDER — CLEVIDIPINE BUTYRATE 0.5 MG/ML IV EMUL
0.0000 mg/h | INTRAVENOUS | Status: DC
  Administered 2021-09-08: 8 mg/h via INTRAVENOUS
  Administered 2021-09-08: 6 mg/h via INTRAVENOUS
  Administered 2021-09-08: 8 mg/h via INTRAVENOUS
  Administered 2021-09-08: 12 mg/h via INTRAVENOUS
  Administered 2021-09-09: 6 mg/h via INTRAVENOUS
  Administered 2021-09-09: 4 mg/h via INTRAVENOUS
  Filled 2021-09-08 (×7): qty 50

## 2021-09-08 NOTE — Consult Note (Signed)
Cardiology Consultation:   Patient ID: Craig Schmidt MRN: [id number]; DOB: [birth year]  Admit date: 09/08/2021 Date of Consult: 09/08/2021  PCP:  Marliss Coots, NP   [facility] Providers Cardiologist:  None        Patient Profile:   Craig Schmidt is a 51 y.o. male with a hx of smoking, asthma, schwannoma, cocaine abuse who is being seen 09/08/2021 for the evaluation of depressed ejection fraction and elevated troponin at the request of Dr Amie Portland.  History of Present Illness:   Craig Schmidt presented to [facility] as a code stroke.  He tells me that he was admitted after he presented from home.  He notes that he was at home in the morning when he was angry with his significant other.  During that time she noticed that he went flaccid on the right side and could not move his right hand and feet.  He was brought in as a code stroke.  His NIH SS score was 22 at the time.  He was noted to be disoriented with left gaze preference and hemianopia and right facial droop he also did have right arm weakness with bilateral leg weakness.  There was no definitive aphasia.  He was sent directly for CTA of the head and neck.  He was noted to be a candidate for IV thrombolytic - TNK was given to the patient.   The patient was just transferred to the neuro ICU.  Cardiology has been consulted given his abnormal echocardiogram showing depressed ejection fraction with valvular heart disease and elevated troponin.  Patient seen and examined at his bedside in the neuro ICU.  He denies any chest pain or shortness of breath.  He reports right sided weakness including upper and lower extremities.  Facial droop has resolved.  Past Medical History:  Diagnosis Date   Asthma    Brain tumor [facility])     History reviewed. No pertinent surgical history.   Home Medications:  Prior to Admission medications   Medication Sig Start Date End Date Taking? Authorizing Provider  ibuprofen (ADVIL)  200 MG tablet Take 400 mg by mouth every 6 (six) hours as needed (anxiety/panic attacks).   Yes [provider]    Inpatient Medications: Scheduled Meds:  Chlorhexidine Gluconate Cloth  6 each Topical Q0600   pantoprazole (PROTONIX) IV  40 mg Intravenous QHS   sodium chloride flush  3 mL Intravenous Once   Continuous Infusions:  sodium chloride 75 mL/hr at 09/08/21 1700   clevidipine 12 mg/hr (09/08/21 1700)   PRN Meds: acetaminophen **OR** acetaminophen (TYLENOL) oral liquid 160 mg/5 mL **OR** acetaminophen, senna-docusate  Allergies:    Allergies  Allergen Reactions   Peanut-Containing Drug Products Itching   Shellfish Allergy Swelling    Social History:   Social History   Socioeconomic History   Marital status: Married    Spouse name: Not on file   Number of children: Not on file   Years of education: Not on file   Highest education level: Not on file  Occupational History   Not on file  Tobacco Use   Smoking status: Every Day    Packs/day: 2.00    Types: Cigarettes   Smokeless tobacco: Never  Substance and Sexual Activity   Alcohol use: Yes    Comment: Socially    Drug use: No   Sexual activity: Not on file  Other Topics Concern   Not on file  Social History Narrative   Not  on file   Social Determinants of Health   Financial Resource Strain: Not on file  Food Insecurity: Not on file  Transportation Needs: Not on file  Physical Activity: Not on file  Stress: Not on file  Social Connections: Not on file  Intimate Partner Violence: Not on file    Family History:   History reviewed. No pertinent family history.   ROS:  Please see the history of present illness.   All other ROS reviewed and negative.     Physical Exam/Data:   Vitals:   09/08/21 1715 09/08/21 1730 09/08/21 1737 09/08/21 1745  BP: (!) 115/92  110/89 (!) 106/92  Pulse: 96 96  99  Resp: 17 18  18   Temp:      TempSrc:      SpO2: 90% 92%  (!) 80%  Weight:      Height:         Intake/Output Summary (Last 24 hours) at 09/08/2021 1759 Last data filed at 09/08/2021 1700 Gross per 24 hour  Intake 652.89 ml  Output 775 ml  Net -122.11 ml   Last 3 Weights 09/08/2021 09/08/2021 10/03/2018  Weight (lbs) 176 lb 5.9 oz 176 lb 5.9 oz 160 lb  Weight (kg) 80 kg 80 kg 72.576 kg     Body mass index is 29.35 kg/m.  General:  Well nourished, well developed, in no acute distress HEENT: normal Neck: no JVD Vascular: No carotid bruits; Distal pulses 2+ bilaterally Cardiac:  normal S1, S2; RRR; 1/6 holosystolic murmur  Lungs:  clear to auscultation bilaterally, no wheezing, rhonchi or rales  Abd: soft, nontender, no hepatomegaly  Ext: no edema Musculoskeletal:  No deformities, BUE and BLE strength normal and equal Skin: warm and dry  Neuro: Right-sided upper and lower extremity weakness 0/5- can not lift his extremities against gravity, left side 5 out of 5 upper and lower extremities  psych:  Normal affect   EKG:  The EKG was personally reviewed and demonstrates: Sinus rhythm, heart rate 98 bpm with ST/T wave inversions in the lateral leads.  Prolonged QTc Telemetry:  Telemetry was personally reviewed and demonstrates: Sinus rhythm  Relevant CV Studies:  TTE 09/08/2021 IMPRESSIONS   1. Left ventricular ejection fraction, by estimation, is 25 to 30%. The left ventricle has severely decreased function. The left ventricle demonstrates global hypokinesis. Left ventricular diastolic parameters are consistent with Grade II diastolic dysfunction (pseudonormalization).   2. Right ventricular systolic function is mildly reduced. The right ventricular size is normal. There is moderately elevated pulmonary artery systolic pressure. The estimated right ventricular systolic pressure is 51.7 mmHg.   3. Left atrial size was mild to moderately dilated.   4. Right atrial size was mildly dilated.   5. The mitral valve is rheumatic. Mild to moderate mitral valve regurgitation. Moderate  mitral stenosis. The mean mitral valve gradient is 6.0 mmHg with MVA 1.3 cm^2 by VTI.   6. Tricuspid valve regurgitation is moderate to severe.   7. The aortic valve is tricuspid. Aortic valve regurgitation is not visualized. Aortic valve sclerosis/calcification is present, without any evidence of aortic stenosis.   8. The inferior vena cava is dilated in size with <50% respiratory variability, suggesting right atrial pressure of 15 mmHg.   FINDINGS   Left Ventricle: Left ventricular ejection fraction, by estimation, is 25  to 30%. The left ventricle has severely decreased function. The left  ventricle demonstrates global hypokinesis. The left ventricular internal  cavity size was normal in size. There  is no left ventricular hypertrophy. Left ventricular diastolic parameters  are consistent with Grade II diastolic dysfunction (pseudonormalization).   Right Ventricle: The right ventricular size is normal. No increase in  right ventricular wall thickness. Right ventricular systolic function is  mildly reduced. There is moderately elevated pulmonary artery systolic  pressure. The tricuspid regurgitant  velocity is 2.95 m/s, and with an assumed right atrial pressure of 15  mmHg, the estimated right ventricular systolic pressure is 16.0 mmHg.   Left Atrium: Left atrial size was mild to moderately dilated.   Right Atrium: Right atrial size was mildly dilated.   Pericardium: Trivial pericardial effusion is present.   Mitral Valve: The mitral valve is rheumatic. There is mild calcification  of the mitral valve leaflet(s). Mild mitral annular calcification. Mild to  moderate mitral valve regurgitation. Moderate mitral valve stenosis. MV  peak gradient, 15.7 mmHg. The mean   mitral valve gradient is 6.0 mmHg.   Tricuspid Valve: The tricuspid valve is normal in structure. Tricuspid  valve regurgitation is moderate to severe.   Aortic Valve: The aortic valve is tricuspid. Aortic valve  regurgitation is  not visualized. Aortic valve sclerosis/calcification is present, without  any evidence of aortic stenosis. Aortic valve mean gradient measures 3.0  mmHg. Aortic valve peak gradient  measures 4.6 mmHg. Aortic valve area, by VTI measures 2.48 cm.   Pulmonic Valve: The pulmonic valve was normal in structure. Pulmonic valve  regurgitation is trivial.   Aorta: The aortic root is normal in size and structure.   Venous: The inferior vena cava is dilated in size with less than 50%  respiratory variability, suggesting right atrial pressure of 15 mmHg.   IAS/Shunts: No atrial level shunt detected by color flow Doppler.   Laboratory Data:  High Sensitivity Troponin:   Recent Labs  Lab 09/08/21 1031 09/08/21 1303  TROPONINIHS 406* 312*     Chemistry Recent Labs  Lab 09/08/21 0915 09/08/21 0919  NA 139 141  K 3.9 4.1  CL 107 106  CO2 22  --   GLUCOSE 75 72  BUN 19 27*  CREATININE 1.95* 1.90*  CALCIUM 8.8*  --   GFRNONAA 41*  --   ANIONGAP 10  --     Recent Labs  Lab 09/08/21 0915  PROT 6.3*  ALBUMIN 2.9*  AST 59*  ALT 44  ALKPHOS 132*  BILITOT 0.7   Lipids No results for input(s): CHOL, TRIG, HDL, LABVLDL, LDLCALC, CHOLHDL in the last 168 hours.  Hematology Recent Labs  Lab 09/08/21 0915 09/08/21 0919  WBC 10.4  --   RBC 4.12*  --   HGB 12.2* 14.3  HCT 38.9* 42.0  MCV 94.4  --   MCH 29.6  --   MCHC 31.4  --   RDW 14.1  --   PLT 242  --    Thyroid No results for input(s): TSH, FREET4 in the last 168 hours.  BNPNo results for input(s): BNP, PROBNP in the last 168 hours.  DDimer No results for input(s): DDIMER in the last 168 hours.   Radiology/Studies:  DG Chest Port 1 View  Result Date: 09/08/2021 CLINICAL DATA:  Code stroke EXAM: PORTABLE CHEST 1 VIEW COMPARISON:  Portable exam 0950 hours compared to 11/20/2017 FINDINGS: Enlargement of cardiac silhouette with pulmonary vascular congestion. Mild perihilar infiltrate greater on RIGHT,  likely pulmonary edema. Subsegmental atelectasis LEFT lower lobe. No pleural effusion or pneumothorax. Osseous structures unremarkable. IMPRESSION: Enlargement of cardiac silhouette with pulmonary vascular congestion and mild  pulmonary edema. Mild subsegmental atelectasis LEFT lower lobe. Electronically Signed   By: Lavonia Dana M.D.   On: 09/08/2021 10:06   EEG adult  Result Date: 09/08/2021 Greta Doom, MD     09/08/2021 12:01 PM History: 51 yo M being evaluated for right sided weakness Sedation: none Technique: This EEG was acquired with electrodes placed according to the International 10-20 electrode system (including Fp1, Fp2, F3, F4, C3, C4, P3, P4, O1, O2, T3, T4, T5, T6, A1, A2, Fz, Cz, Pz). The following electrodes were missing or displaced: none. Background: The background consists of intermixed alpha and beta activities. There is a well defined posterior dominant rhythm of 9 Hz that attenuates with eye opening. Sleep is recorded with normal appearing structures. Photic stimulation: Physiologic driving is not perofmred EEG Abnormalities: none Clinical Interpretation: This normal EEG is recorded in the waking and drowsy state. There was no seizure or seizure predisposition recorded on this study. Please note that lack of epileptiform activity on EEG does not preclude the possibility of epilepsy. Roland Rack, MD [facility] [phone] If 7pm- 7am, please page neurology on call as listed in [place].   ECHOCARDIOGRAM COMPLETE  Result Date: 09/08/2021    ECHOCARDIOGRAM REPORT   Patient Name:   LIGE LAKEMAN Date of Exam: 09/08/2021 Medical Rec #:  [id number]          Height:       65.0 in Accession #:    [id number]         Weight:       176.4 lb Date of Birth:  02-25-1971           BSA:          1.875 m Patient Age:    37 years           BP:           127/93 mmHg Patient Gender: M                  HR:           99 bpm. Exam Location:  Inpatient Procedure: 2D Echo  Indications:    Stroke  History:        Patient has no prior history of Echocardiogram examinations.  Sonographer:    Arlyss Gandy Referring Phys: [id number] ASHISH ARORA IMPRESSIONS  1. Left ventricular ejection fraction, by estimation, is 25 to 30%. The left ventricle has severely decreased function. The left ventricle demonstrates global hypokinesis. Left ventricular diastolic parameters are consistent with Grade II diastolic dysfunction (pseudonormalization).  2. Right ventricular systolic function is mildly reduced. The right ventricular size is normal. There is moderately elevated pulmonary artery systolic pressure. The estimated right ventricular systolic pressure is 74.1 mmHg.  3. Left atrial size was mild to moderately dilated.  4. Right atrial size was mildly dilated.  5. The mitral valve is rheumatic. Mild to moderate mitral valve regurgitation. Moderate mitral stenosis. The mean mitral valve gradient is 6.0 mmHg with MVA 1.3 cm^2 by VTI.  6. Tricuspid valve regurgitation is moderate to severe.  7. The aortic valve is tricuspid. Aortic valve regurgitation is not visualized. Aortic valve sclerosis/calcification is present, without any evidence of aortic stenosis.  8. The inferior vena cava is dilated in size with <50% respiratory variability, suggesting right atrial pressure of 15 mmHg. FINDINGS  Left Ventricle: Left ventricular ejection fraction, by estimation, is 25 to 30%. The left ventricle has severely decreased function. The left ventricle demonstrates global hypokinesis.  The left ventricular internal cavity size was normal in size. There is no left ventricular hypertrophy. Left ventricular diastolic parameters are consistent with Grade II diastolic dysfunction (pseudonormalization). Right Ventricle: The right ventricular size is normal. No increase in right ventricular wall thickness. Right ventricular systolic function is mildly reduced. There is moderately elevated pulmonary artery systolic  pressure. The tricuspid regurgitant velocity is 2.95 m/s, and with an assumed right atrial pressure of 15 mmHg, the estimated right ventricular systolic pressure is 26.8 mmHg. Left Atrium: Left atrial size was mild to moderately dilated. Right Atrium: Right atrial size was mildly dilated. Pericardium: Trivial pericardial effusion is present. Mitral Valve: The mitral valve is rheumatic. There is mild calcification of the mitral valve leaflet(s). Mild mitral annular calcification. Mild to moderate mitral valve regurgitation. Moderate mitral valve stenosis. MV peak gradient, 15.7 mmHg. The mean  mitral valve gradient is 6.0 mmHg. Tricuspid Valve: The tricuspid valve is normal in structure. Tricuspid valve regurgitation is moderate to severe. Aortic Valve: The aortic valve is tricuspid. Aortic valve regurgitation is not visualized. Aortic valve sclerosis/calcification is present, without any evidence of aortic stenosis. Aortic valve mean gradient measures 3.0 mmHg. Aortic valve peak gradient measures 4.6 mmHg. Aortic valve area, by VTI measures 2.48 cm. Pulmonic Valve: The pulmonic valve was normal in structure. Pulmonic valve regurgitation is trivial. Aorta: The aortic root is normal in size and structure. Venous: The inferior vena cava is dilated in size with less than 50% respiratory variability, suggesting right atrial pressure of 15 mmHg. IAS/Shunts: No atrial level shunt detected by color flow Doppler.  LEFT VENTRICLE PLAX 2D LVIDd:         4.80 cm   Diastology LVIDs:         4.30 cm   LV e' medial:    4.22 cm/s LV PW:         1.10 cm   LV E/e' medial:  39.6 LV IVS:        1.10 cm   LV e' lateral:   7.38 cm/s LVOT diam:     2.06 cm   LV E/e' lateral: 22.6 LV SV:         45 LV SV Index:   24 LVOT Area:     3.33 cm  RIGHT VENTRICLE            IVC RV Basal diam:  4.70 cm    IVC diam: 2.20 cm RV Mid diam:    3.50 cm RV S prime:     8.76 cm/s TAPSE (M-mode): 1.6 cm LEFT ATRIUM             Index        RIGHT ATRIUM            Index LA diam:        3.90 cm 2.08 cm/m   RA Area:     22.80 cm LA Vol (A2C):   74.0 ml 39.46 ml/m  RA Volume:   72.90 ml  38.88 ml/m LA Vol (A4C):   66.8 ml 35.62 ml/m LA Biplane Vol: 73.8 ml 39.36 ml/m  AORTIC VALVE AV Area (Vmax):    2.62 cm AV Area (Vmean):   2.49 cm AV Area (VTI):     2.48 cm AV Vmax:           107.00 cm/s AV Vmean:          76.500 cm/s AV VTI:            0.180  m AV Peak Grad:      4.6 mmHg AV Mean Grad:      3.0 mmHg LVOT Vmax:         84.20 cm/s LVOT Vmean:        57.100 cm/s LVOT VTI:          0.134 m LVOT/AV VTI ratio: 0.74  AORTA Ao Root diam: 2.70 cm Ao Asc diam:  2.40 cm MITRAL VALVE                TRICUSPID VALVE MV Area (PHT): 3.99 cm     TR Peak grad:   34.8 mmHg MV Peak grad:  15.7 mmHg    TR Vmax:        295.00 cm/s MV Mean grad:  6.0 mmHg MV Vmax:       1.98 m/s     SHUNTS MV Vmean:      113.0 cm/s   Systemic VTI:  0.13 m MV Decel Time: 190 msec     Systemic Diam: 2.06 cm MV E velocity: 167.00 cm/s MV A velocity: 108.00 cm/s MV E/A ratio:  1.55 Dalton McleanMD Electronically signed by Franki Monte Signature Date/Time: 09/08/2021/3:52:33 PM    Final    CT HEAD CODE STROKE WO CONTRAST  Result Date: 09/08/2021 CLINICAL DATA:  Code stroke.  51 year old male with weakness. EXAM: CT HEAD WITHOUT CONTRAST TECHNIQUE: Contiguous axial images were obtained from the base of the skull through the vertex without intravenous contrast. RADIATION DOSE REDUCTION: This exam was performed according to the departmental dose-optimization program which includes automated exposure control, adjustment of the mA and/or kV according to patient size and/or use of iterative reconstruction technique. COMPARISON:  Head CT 10/03/2018.  Brain MRI 09/16/2014. FINDINGS: Brain: Decreased cerebral volume since 2020 appears to be generalized and is age advanced. Cavum septum pellucidum. No ventriculomegaly. No midline shift. 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 Several bulky dural calcifications along the tentorium are noted. Streak artifacts suspected along the right inferior frontal gyrus. No acute intracranial hemorrhage identified. No cortically based acute infarct identified. Gray-white matter differentiation within normal limits. Vascular: Mild Calcified atherosclerosis at the skull base. No suspicious intracranial vascular hyperdensity. Skull: No acute osseous abnormality identified. Sinuses/Orbits: Visualized paranasal sinuses and mastoids are clear. Other: Disconjugate leftward gaze. Visualized scalp soft tissues are within normal limits. ASPECTS [facility] Stroke Program Early CT Score) Total score (0-10 with 10 being normal): 10 IMPRESSION: 1. No acute cortically based infarct or acute intracranial hemorrhage identified. ASPECTS 10. 2. Generalized cerebral volume loss with progression since 2020. Chronic left cerebellopontine angle vestibular schwannoma. 3. These results were communicated to Dr. Rory Percy at 9:27 am on 09/08/2021 by text page via the [facility] messaging system. Electronically Signed   By: Genevie Ann M.D.   On: 09/08/2021 09:27   CT ANGIO HEAD NECK W WO CM (CODE STROKE)  Result Date: 09/08/2021 CLINICAL DATA:  51 year old male code stroke presentation. Some mixed symptoms, but predominantly left MCA implicated. History of cocaine use. EXAM: CT ANGIOGRAPHY HEAD AND NECK TECHNIQUE: Multidetector CT imaging of the head and neck was performed using the standard protocol during bolus administration of intravenous contrast. Multiplanar CT image reconstructions and MIPs were obtained to evaluate the vascular anatomy. Carotid stenosis measurements (when applicable) are obtained utilizing NASCET criteria, using the distal internal carotid diameter as the denominator. RADIATION DOSE REDUCTION:  This exam was performed according to the departmental  dose-optimization program which includes automated exposure control, adjustment of the mA and/or kV according to patient size and/or use of iterative reconstruction technique. CONTRAST:  72mL OMNIPAQUE IOHEXOL 350 MG/ML SOLN COMPARISON:  Plain head CT 10/03/2018.  Brain MRI 05/17/2015. FINDINGS: CTA NECK Skeleton: No acute osseous abnormality identified. Upper chest: Low lung volumes with mild mosaic attenuation and septal thickening in the lung apices. Evidence of trace pleural fluid. Nonspecific increased mediastinal lymph nodes, such as 14 mm prevascular node on series 6, image 162. Visible left axillary lymph nodes appear normal. Other neck: Negative.  No cervical lymphadenopathy identified. Aortic arch: 3 vessel arch configuration.  No arch atherosclerosis. Right carotid system: Negative; no atherosclerosis or stenosis. Left carotid system: Negative.  No atherosclerosis or stenosis. Vertebral arteries: Proximal right subclavian artery and right vertebral artery origin are normal. The right vertebral artery is diminutive but patent to the skull base with no stenosis identified. Proximal left subclavian artery and left vertebral artery origin are normal. Left vertebral artery appears dominant but still somewhat diminutive. Left vertebrals patent to the skull base without plaque or stenosis. CTA HEAD Posterior circulation: Diffusely somewhat diminutive posterior circulation but no vertebrobasilar atherosclerosis or stenosis. Dominant left V4. Both PICA origins are patent. AICA, SCA and PCA origins are patent. Similar diminutive but patent PCAs without stenosis. Posterior communicating arteries are diminutive or absent. Anterior circulation: Diminutive anterior circulation appearance also. Both ICA siphons are patent. There is some left cavernous ICA calcified atherosclerosis suspected, but no significant siphon stenosis. Patent carotid termini, diminutive ACAs and MCAs. M1 segments and bifurcations are patent  without stenosis. Bilateral visible ACA and MCA branches appear diminutive but otherwise within normal limits. Venous sinuses: Early contrast timing, not evaluated. Anatomic variants: Dominant left vertebral artery. Review of the MIP images confirms the above findings Vascular findings discussed by telephone with Dr. Amie Portland on 09/08/2021 at 09:48 . IMPRESSION: 1. Negative for large vessel occlusion, and only minimal atherosclerosis limited to the left ICA siphon. But diffusely diminutive anterior and posterior circulation vessel caliber is noted although nonspecific. 2. Enlarged but nonspecific visible superior mediastinal lymph nodes. Some mosaic attenuation, septal thickening, and pleural fluid in the lung apices. Query CHF. Follow-up Chest CT may be valuable. 3. No neck mass or lymphadenopathy. Electronically Signed   By: Genevie Ann M.D.   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  He has had elevated troponin along with some EKG changes clinically he does not have any anginal symptoms.  He also has cocaine positive which may be contributing to his elevated troponin.  For right now we will continue to monitor.  And if he develops anginal symptoms giving his recent CVA a coronary CTA will be pursue for any ischemic evaluation.  Clinically there is no need for heparin drip at this time.  I was not fully able to educate the patient about his cocaine use because he had other family members in the room.    Does have severe depressed ejection fraction-etiology is ambiguous at this  time.  But for now  we will pursue medical management -his blood pressure is on the lower side while he is on his clevidipine for right now once he is transition of that we can be able to start guideline medical therapy.  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  We will plan for TEE next week to be able to understand his valvular heart disease.  Prolonged QTc-avoid QT prolonging medications.  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   Risk Assessment/Risk Scores:       For questions or updates, please contact [place] Please consult [email or web address] for contact info under    Signed, Berniece Salines, DO  09/08/2021 5:59 PM

## 2021-09-08 NOTE — Progress Notes (Signed)
Noted trace amount of blood in mouth. Mouthcare preformed. No additional blood noted. Dr. Rory Percy aware. Will monitor closely and update as needed.

## 2021-09-08 NOTE — Progress Notes (Signed)
PT Cancellation Note  Patient Details Name: Craig Schmidt MRN: [id number] DOB: 07-12-1971   Cancelled Treatment:    Reason Eval/Treat Not Completed: Active bedrest order   Zenaida Niece 09/08/2021, 4:15 PM
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
## 2021-09-08 NOTE — Progress Notes (Signed)
While performing 12-lead EKG, patient began having receptive aphasia. Previously, just expressive aphasia. Tammy- Stroke Response Nurse contacted Dr. Rory Percy.

## 2021-09-08 NOTE — Progress Notes (Signed)
PHARMACIST CODE STROKE RESPONSE  Notified to mix TNK at 0920 by Dr. Rory Percy Delivered TNK to RN at 0922  TNK dose = 20 mg IV over 5 seconds.   Issues/delays encountered (if applicable): DBP management including cleviprex gtt to goal prior to TNK administration  Bertis Ruddy 09/08/21 9:35 AM

## 2021-09-08 NOTE — Progress Notes (Signed)
EEG completed, results pending. 
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
## 2021-09-08 NOTE — Progress Notes (Signed)
Echocardiogram 2D Echocardiogram has been performed.  Arlyss Gandy 09/08/2021, 1:46 PM

## 2021-09-08 NOTE — Code Documentation (Signed)
Stroke Response Nurse Documentation Code Documentation  Craig Schmidt is a 51 y.o. male arriving to [facility] ED via [facility] on 09/08/2021 with past medical hx of smoker, asthma, schwannoma, and cocaine abuse.   Patient from home where he was LKW at [place]. He was with his significant other and was angry. She noted he went flaccid on the right and called 911. Code stroke was activated by EMS.   Stroke team at the bedside on patient arrival. Labs drawn and patient cleared for CT by EDP. Patient to CT with team. NIHSS 22, see documentation for details and code stroke times. Patient with disoriented, left gaze preference , right hemianopia, right facial droop, right arm weakness, bilateral leg weakness, right decreased sensation, Expressive aphasia , and Sensory  neglect on exam. The following imaging was completed: CT, CTA head and neck. Patient  a candidate for IV Thrombolytic. DBP elevated. Cleviprex gtt started and titrated to reach goal of DBP<110. TNK given 0934. See documentation for BPs and medication titration. Patient is not a candidate for IR due to no LVO.   Care/Plan: admit to ICU; report given to ICU RN Delsa Sale. EEG ordered. Monitor closely with post TNK orderset.  Earma Reading  Stroke Response RN

## 2021-09-08 NOTE — Procedures (Signed)
History: 51 yo M being evaluated for right sided weakness  Sedation: none  Technique: This EEG was acquired with electrodes placed according to the International 10-20 electrode system (including Fp1, Fp2, F3, F4, C3, C4, P3, P4, O1, O2, T3, T4, T5, T6, A1, A2, Fz, Cz, Pz). The following electrodes were missing or displaced: none.   Background: The background consists of intermixed alpha and beta activities. There is a well defined posterior dominant rhythm of 9 Hz that attenuates with eye opening. Sleep is recorded with normal appearing structures.   Photic stimulation: Physiologic driving is not perofmred  EEG Abnormalities: none  Clinical Interpretation: This normal EEG is recorded in the waking and drowsy state. There was no seizure or seizure predisposition recorded on this study. Please note that lack of epileptiform activity on EEG does not preclude the possibility of epilepsy.   Roland Rack, MD [facility] [phone]  If 7pm- 7am, please page neurology on call as listed in [place].

## 2021-09-08 NOTE — Progress Notes (Signed)
Appreciate cardiology consult. Called initially due to Troponiemia and TWI on EKG. Echo with EF 25-30% LA mild to mod dilated. RA dilated. Moderate MS. 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 Possibly will need TEE. Trop trending down. Will follow full consult once in the chart. No need for heparinization per RN communication.  -- Amie Portland, MD Neurologist [facility] Pager: [phone]

## 2021-09-08 NOTE — ED Provider Notes (Signed)
[place] EMERGENCY DEPARTMENT Provider Note   CSN: [id number] Arrival date & time: 09/08/21  5993  An emergency department physician performed an initial assessment on this suspected stroke patient at 0911.  History  Chief Complaint  Patient presents with   Code Stroke    Craig Schmidt is a 51 y.o. male.  HPI     51 year old male with a history of left vestibular schwannoma, asthma alcohol use, cocaine abuse, who presents as a code stroke with right-sided weakness and aphasia.  Per report, he was in normal state of health, in an argument this morning and at 830 he suddenly developed weakness of his right side and difficulty speaking.  History is limited by acuity of condition and aphasia.  He is unable to get words out to state  chief concerns, does answer yes no questions.  Denies pain, including no headache, no chest pain. Able to state name   Past Medical History:  Diagnosis Date   Asthma    Brain tumor ([place])      Home Medications Prior to Admission medications   Medication Sig Start Date End Date Taking? Authorizing Provider  albuterol (PROVENTIL HFA;VENTOLIN HFA) 108 (90 BASE) MCG/ACT inhaler Inhale 2 puffs into the lungs every 4 (four) hours as needed for wheezing or shortness of breath. Patient not taking: Reported on 10/03/2018 06/03/15   Jola Schmidt, MD  cephALEXin (KEFLEX) 500 MG capsule Take 1 capsule (500 mg total) by mouth 2 (two) times daily. 10/03/18   Quintella Reichert, MD  folic acid (FOLVITE) 1 MG tablet Take 1 tablet (1 mg total) by mouth daily. Patient not taking: Reported on 11/20/2017 05/17/15   Robbie Lis, MD  meclizine (ANTIVERT) 25 MG tablet Take 1 tablet (25 mg total) by mouth 2 (two) times daily as needed for dizziness. Patient not taking: Reported on 10/03/2018 11/16/17   Duffy Bruce, MD  pantoprazole (PROTONIX) 20 MG tablet Take 1 tablet (20 mg total) by mouth daily. Patient not taking: Reported on 10/03/2018 11/20/17    Antonietta Breach, [name]      Allergies    Peanut-containing drug products and Shellfish allergy    Review of Systems   Review of Systems  Physical Exam Updated Vital Signs BP 127/72    Pulse 99    Temp (!) 97.5 F (36.4 C) (Oral)    Resp 19    Wt 80 kg    BMI 28.47 kg/m  Physical Exam Vitals and nursing note reviewed.  Constitutional:      General: He is not in acute distress.    Appearance: He is well-developed. He is not diaphoretic.  HENT:     Head: Normocephalic and atraumatic.  Eyes:     Conjunctiva/sclera: Conjunctivae normal.  Cardiovascular:     Rate and Rhythm: Normal rate and regular rhythm.     Heart sounds: Normal heart sounds. No murmur heard.   No friction rub. No gallop.  Pulmonary:     Effort: Pulmonary effort is normal. No respiratory distress.     Breath sounds: Normal breath sounds. No wheezing or rales.  Abdominal:     General: There is no distension.     Palpations: Abdomen is soft.     Tenderness: There is no abdominal tenderness. There is no guarding.  Musculoskeletal:     Cervical back: Normal range of motion.  Skin:    General: Skin is warm and dry.  Neurological:     Mental Status: He is alert.  Comments: States name Right left gaze preference, was seen to pass midline with significant encouragement Right sided weakness, including right face, right arm and leg Altered sensation to right, cannot feel light touch Able to state name, name phone, not able to name watch, speaking with words that do not seem to answer question    ED Results / Procedures / Treatments   Labs (all labs ordered are listed, but only abnormal results are displayed) Labs Reviewed  CBC - Abnormal; Notable for the following components:      Result Value   RBC 4.12 (*)    Hemoglobin 12.2 (*)    HCT 38.9 (*)    All other components within normal limits  I-STAT CHEM 8, ED - Abnormal; Notable for the following components:   BUN 27 (*)    Creatinine, Ser 1.90 (*)     Calcium, Ion 1.10 (*)    All other components within normal limits  MRSA NEXT GEN BY PCR, NASAL  PROTIME-INR  APTT  DIFFERENTIAL  COMPREHENSIVE METABOLIC PANEL  RAPID URINE DRUG SCREEN, HOSP PERFORMED  URINALYSIS, ROUTINE W REFLEX MICROSCOPIC  ETHANOL  HIV ANTIBODY (ROUTINE TESTING W REFLEX)  CBG MONITORING, ED  TROPONIN I (HIGH SENSITIVITY)    EKG EKG Interpretation  Date/Time:  Friday September 08 2021 09:49:07 EST Ventricular Rate:  98 PR Interval:  139 QRS Duration: 96 QT Interval:  403 QTC Calculation: 515 R Axis:   51 Text Interpretation: Sinus rhythm Right atrial enlargement Probable LVH with secondary repol abnrm Prolonged QT interval Nonspecfic TW changes, new TW inversions laterally in comparison to prior ECG Confirmed by Gareth Morgan [phone]) on 09/08/2021 10:03:07 AM  Radiology CT HEAD CODE STROKE WO CONTRAST  Result Date: 09/08/2021 CLINICAL DATA:  Code stroke.  51 year old male with weakness. EXAM: CT HEAD WITHOUT CONTRAST TECHNIQUE: Contiguous axial images were obtained from the base of the skull through the vertex without intravenous contrast. RADIATION DOSE REDUCTION: This exam was performed according to the departmental dose-optimization program which includes automated exposure control, adjustment of the mA and/or kV according to patient size and/or use of iterative reconstruction technique. COMPARISON:  Head CT 10/03/2018.  Brain MRI 09/16/2014. FINDINGS: Brain: Decreased cerebral volume since 2020 appears to be generalized and is age advanced. Cavum septum pellucidum. No ventriculomegaly. No midline shift. There is a chronic left cerebellopontine angle mass which appears not significantly changed from 2016 (series 2, image 10) most compatible with chronic left vestibular schwannoma. Several bulky dural calcifications along the tentorium are noted. Streak artifacts suspected along the right inferior frontal gyrus. No acute intracranial hemorrhage identified. No  cortically based acute infarct identified. Gray-white matter differentiation within normal limits. Vascular: Mild Calcified atherosclerosis at the skull base. No suspicious intracranial vascular hyperdensity. Skull: No acute osseous abnormality identified. Sinuses/Orbits: Visualized paranasal sinuses and mastoids are clear. Other: Disconjugate leftward gaze. Visualized scalp soft tissues are within normal limits. ASPECTS [facility] Stroke Program Early CT Score) Total score (0-10 with 10 being normal): 10 IMPRESSION: 1. No acute cortically based infarct or acute intracranial hemorrhage identified. ASPECTS 10. 2. Generalized cerebral volume loss with progression since 2020. Chronic left cerebellopontine angle vestibular schwannoma. 3. These results were communicated to Dr. Rory Percy at 9:27 am on 09/08/2021 by text page via the [facility] messaging system. Electronically Signed   By: Genevie Ann M.D.   On: 09/08/2021 09:27   CT ANGIO HEAD NECK W WO CM (CODE STROKE)  Result Date: 09/08/2021 CLINICAL DATA:  51 year old male code stroke  presentation. Some mixed symptoms, but predominantly left MCA implicated. History of cocaine use. EXAM: CT ANGIOGRAPHY HEAD AND NECK TECHNIQUE: Multidetector CT imaging of the head and neck was performed using the standard protocol during bolus administration of intravenous contrast. Multiplanar CT image reconstructions and MIPs were obtained to evaluate the vascular anatomy. Carotid stenosis measurements (when applicable) are obtained utilizing NASCET criteria, using the distal internal carotid diameter as the denominator. RADIATION DOSE REDUCTION: This exam was performed according to the departmental dose-optimization program which includes automated exposure control, adjustment of the mA and/or kV according to patient size and/or use of iterative reconstruction technique. CONTRAST:  26mL OMNIPAQUE IOHEXOL 350 MG/ML SOLN COMPARISON:  Plain head CT 10/03/2018.  Brain MRI 05/17/2015. FINDINGS: CTA  NECK Skeleton: No acute osseous abnormality identified. Upper chest: Low lung volumes with mild mosaic attenuation and septal thickening in the lung apices. Evidence of trace pleural fluid. Nonspecific increased mediastinal lymph nodes, such as 14 mm prevascular node on series 6, image 162. Visible left axillary lymph nodes appear normal. Other neck: Negative.  No cervical lymphadenopathy identified. Aortic arch: 3 vessel arch configuration.  No arch atherosclerosis. Right carotid system: Negative; no atherosclerosis or stenosis. Left carotid system: Negative.  No atherosclerosis or stenosis. Vertebral arteries: Proximal right subclavian artery and right vertebral artery origin are normal. The right vertebral artery is diminutive but patent to the skull base with no stenosis identified. Proximal left subclavian artery and left vertebral artery origin are normal. Left vertebral artery appears dominant but still somewhat diminutive. Left vertebrals patent to the skull base without plaque or stenosis. CTA HEAD Posterior circulation: Diffusely somewhat diminutive posterior circulation but no vertebrobasilar atherosclerosis or stenosis. Dominant left V4. Both PICA origins are patent. AICA, SCA and PCA origins are patent. Similar diminutive but patent PCAs without stenosis. Posterior communicating arteries are diminutive or absent. Anterior circulation: Diminutive anterior circulation appearance also. Both ICA siphons are patent. There is some left cavernous ICA calcified atherosclerosis suspected, but no significant siphon stenosis. Patent carotid termini, diminutive ACAs and MCAs. M1 segments and bifurcations are patent without stenosis. Bilateral visible ACA and MCA branches appear diminutive but otherwise within normal limits. Venous sinuses: Early contrast timing, not evaluated. Anatomic variants: Dominant left vertebral artery. Review of the MIP images confirms the above findings Vascular findings discussed by  telephone with Dr. Amie Portland on 09/08/2021 at 09:48 . IMPRESSION: 1. Negative for large vessel occlusion, and only minimal atherosclerosis limited to the left ICA siphon. But diffusely diminutive anterior and posterior circulation vessel caliber is noted although nonspecific. 2. Enlarged but nonspecific visible superior mediastinal lymph nodes. Some mosaic attenuation, septal thickening, and pleural fluid in the lung apices. Query CHF. Follow-up Chest CT may be valuable. 3. No neck mass or lymphadenopathy. Electronically Signed   By: Genevie Ann M.D.   On: 09/08/2021 09:57    Procedures .Critical Care Performed by: Gareth Morgan, MD Authorized by: Gareth Morgan, MD   Critical care provider statement:    Critical care time (minutes):  30   Critical care was time spent personally by me on the following activities:  Development of treatment plan with patient or surrogate, discussions with consultants, evaluation of patient's response to treatment, examination of patient, ordering and review of laboratory studies, ordering and review of radiographic studies, ordering and performing treatments and interventions, pulse oximetry and review of old charts    Medications Ordered in ED Medications  sodium chloride flush (NS) 0.9 % injection 3 mL (has no administration in  time range)  clevidipine (CLEVIPREX) infusion 0.5 mg/mL (has no administration in time range)   stroke: mapping our early stages of recovery book (has no administration in time range)  0.9 %  sodium chloride infusion (has no administration in time range)  acetaminophen (TYLENOL) tablet 650 mg (has no administration in time range)    Or  acetaminophen (TYLENOL) 160 MG/5ML solution 650 mg (has no administration in time range)    Or  acetaminophen (TYLENOL) suppository 650 mg (has no administration in time range)  senna-docusate (Senokot-S) tablet 1 tablet (has no administration in time range)  pantoprazole (PROTONIX) injection 40 mg  (has no administration in time range)  Chlorhexidine Gluconate Cloth 2 % PADS 6 each (has no administration in time range)  tenecteplase (TNKASE) injection for Stroke 20 mg (20 mg Intravenous Given 09/08/21 0934)  iohexol (OMNIPAQUE) 350 MG/ML injection 75 mL (75 mLs Intravenous Contrast Given 09/08/21 9407)    ED Course/ Medical Decision Making/ A&P                           Medical Decision Making Amount and/or Complexity of Data Reviewed Labs: ordered. Radiology: ordered.  Risk Decision regarding hospitalization.    51 year old male with a history of left vestibular schwannoma, asthma alcohol use, cocaine abuse, who presents as a code stroke with right-sided weakness and aphasia.  Neurology team at bedside on arrival and he was taken to CT. Glucose 72.  CT head personally reviewed by me, neurology and radiology shows no acute intracranial bleed.  TNK initiated by neurology and placed on cleviprex given diastolic hypertension.  Labs personally reviewed and interpreted by me show hgb 12.2, no leukocytosis i-STAT Chem-8 with creatinine of 1.9, no other significant electrolyte abnormalities.  Chest x-ray personally reviewed by me and shows cardiomegaly, interstitial prominence with radiology read pending at time of transfer to ICU. Troponin and CMP also pending at time of transfer.  Admitted to ICU for continued care of acute CVA treated by TNK with Neurology.          Final Clinical Impression(s) / ED Diagnoses Final diagnoses:  Stroke (cerebrum) [facility])    Rx / DC Orders ED Discharge Orders     None         Gareth Morgan, MD 09/08/21 1008

## 2021-09-08 NOTE — Progress Notes (Signed)
Date and time results received: 09/08/21 11:40 AM (use smartphrase ".now" to insert current time)  Test: TROP Critical Value: 406  Name of Provider Notified: Amie Portland MD  Orders Received? Or Actions Taken?: 12-lead EKG

## 2021-09-09 ENCOUNTER — Inpatient Hospital Stay (HOSPITAL_COMMUNITY): Payer: Medicaid Other

## 2021-09-09 DIAGNOSIS — I255 Ischemic cardiomyopathy: Secondary | ICD-10-CM

## 2021-09-09 DIAGNOSIS — I6389 Other cerebral infarction: Secondary | ICD-10-CM

## 2021-09-09 DIAGNOSIS — F1410 Cocaine abuse, uncomplicated: Secondary | ICD-10-CM

## 2021-09-09 DIAGNOSIS — F1490 Cocaine use, unspecified, uncomplicated: Secondary | ICD-10-CM

## 2021-09-09 LAB — BASIC METABOLIC PANEL
Anion gap: 9 (ref 5–15)
BUN: 17 mg/dL (ref 6–20)
CO2: 21 mmol/L — ABNORMAL LOW (ref 22–32)
Calcium: 8.2 mg/dL — ABNORMAL LOW (ref 8.9–10.3)
Chloride: 105 mmol/L (ref 98–111)
Creatinine, Ser: 1.64 mg/dL — ABNORMAL HIGH (ref 0.61–1.24)
GFR, Estimated: 51 mL/min — ABNORMAL LOW (ref >60)
Glucose, Bld: 147 mg/dL — ABNORMAL HIGH (ref 70–99)
Potassium: 4.5 mmol/L (ref 3.5–5.1)
Sodium: 135 mmol/L (ref 135–145)

## 2021-09-09 LAB — CBC
HCT: 33.4 % — ABNORMAL LOW (ref 39.0–52.0)
Hemoglobin: 11 g/dL — ABNORMAL LOW (ref 13.0–17.0)
MCH: 29.8 pg (ref 26.0–34.0)
MCHC: 32.9 g/dL (ref 30.0–36.0)
MCV: 90.5 fL (ref 80.0–100.0)
Platelets: 229 10*3/uL (ref 150–400)
RBC: 3.69 MIL/uL — ABNORMAL LOW (ref 4.22–5.81)
RDW: 14.1 % (ref 11.5–15.5)
WBC: 10.1 10*3/uL (ref 4.0–10.5)
nRBC: 0 % (ref 0.0–0.2)

## 2021-09-09 LAB — LIPID PANEL
Cholesterol: 110 mg/dL (ref 0–200)
HDL: 21 mg/dL — ABNORMAL LOW (ref >40)
LDL Cholesterol: 77 mg/dL (ref 0–99)
Total CHOL/HDL Ratio: 5.2 RATIO
Triglycerides: 60 mg/dL (ref <150)
VLDL: 12 mg/dL (ref 0–40)

## 2021-09-09 LAB — HEMOGLOBIN A1C
Hgb A1c MFr Bld: 5.6 % (ref 4.8–5.6)
Mean Plasma Glucose: 114.02 mg/dL

## 2021-09-09 LAB — TRIGLYCERIDES: Triglycerides: 61 mg/dL (ref <150)

## 2021-09-09 MED ORDER — ISOSORB DINITRATE-HYDRALAZINE 20-37.5 MG PO TABS
1.0000 | ORAL_TABLET | Freq: Three times a day (TID) | ORAL | Status: DC
  Administered 2021-09-09 – 2021-10-03 (×67): 1 via ORAL
  Filled 2021-09-09 (×72): qty 1

## 2021-09-09 MED ORDER — ATORVASTATIN CALCIUM 40 MG PO TABS
40.0000 mg | ORAL_TABLET | Freq: Every day | ORAL | Status: DC
  Administered 2021-09-09 – 2021-10-03 (×25): 40 mg via ORAL
  Filled 2021-09-09 (×25): qty 1

## 2021-09-09 MED ORDER — FUROSEMIDE 10 MG/ML IJ SOLN
40.0000 mg | Freq: Once | INTRAMUSCULAR | Status: AC
  Administered 2021-09-09: 40 mg via INTRAVENOUS
  Filled 2021-09-09: qty 4

## 2021-09-09 MED ORDER — PANTOPRAZOLE SODIUM 40 MG PO TBEC
40.0000 mg | DELAYED_RELEASE_TABLET | Freq: Every day | ORAL | Status: DC
  Administered 2021-09-09 – 2021-10-03 (×25): 40 mg via ORAL
  Filled 2021-09-09 (×25): qty 1

## 2021-09-09 MED ORDER — ENOXAPARIN SODIUM 40 MG/0.4ML IJ SOSY
40.0000 mg | PREFILLED_SYRINGE | INTRAMUSCULAR | Status: DC
Start: 2021-09-09 — End: 2021-09-15
  Administered 2021-09-09 – 2021-09-14 (×6): 40 mg via SUBCUTANEOUS
  Filled 2021-09-09 (×6): qty 0.4

## 2021-09-09 MED ORDER — ASPIRIN EC 325 MG PO TBEC
325.0000 mg | DELAYED_RELEASE_TABLET | Freq: Every day | ORAL | Status: DC
  Administered 2021-09-09 – 2021-09-15 (×7): 325 mg via ORAL
  Filled 2021-09-09 (×7): qty 1

## 2021-09-09 MED ORDER — DIPHENHYDRAMINE HCL 50 MG/ML IJ SOLN
25.0000 mg | Freq: Once | INTRAMUSCULAR | Status: AC
  Administered 2021-09-09: 25 mg via INTRAVENOUS
  Filled 2021-09-09: qty 1

## 2021-09-09 MED ORDER — NICOTINE 21 MG/24HR TD PT24
21.0000 mg | MEDICATED_PATCH | Freq: Every day | TRANSDERMAL | Status: DC
  Administered 2021-09-09 – 2021-09-25 (×17): 21 mg via TRANSDERMAL
  Filled 2021-09-09 (×17): qty 1

## 2021-09-09 MED ORDER — INFLUENZA VAC SPLIT QUAD 0.5 ML IM SUSY: 0.5000 mL | PREFILLED_SYRINGE | INTRAMUSCULAR | Status: DC

## 2021-09-09 MED ORDER — PANTOPRAZOLE 2 MG/ML SUSPENSION: 40.0000 mg | Freq: Every day | ORAL | Status: DC

## 2021-09-09 MED ORDER — WARFARIN - PHARMACIST DOSING INPATIENT: Freq: Every day | Status: DC

## 2021-09-09 MED ORDER — CARVEDILOL 12.5 MG PO TABS
12.5000 mg | ORAL_TABLET | Freq: Two times a day (BID) | ORAL | Status: DC
  Administered 2021-09-09 – 2021-09-19 (×20): 12.5 mg via ORAL
  Filled 2021-09-09 (×20): qty 1

## 2021-09-09 MED ORDER — MELATONIN 3 MG PO TABS
3.0000 mg | ORAL_TABLET | Freq: Every day | ORAL | Status: DC
  Administered 2021-09-09 – 2021-09-14 (×6): 3 mg via ORAL
  Filled 2021-09-09 (×6): qty 1

## 2021-09-09 MED ORDER — WARFARIN SODIUM 5 MG PO TABS
5.0000 mg | ORAL_TABLET | Freq: Once | ORAL | Status: AC
  Administered 2021-09-09: 5 mg via ORAL
  Filled 2021-09-09: qty 1

## 2021-09-09 NOTE — Progress Notes (Signed)
ANTICOAGULATION CONSULT NOTE - Initial Consult  Pharmacy Consult for warfarin Indication: atrial fibrillation  Allergies  Allergen Reactions   Peanut-Containing Drug Products Itching   Shellfish Allergy Swelling    Patient Measurements: Height: 5\' 5"  (165.1 cm) Weight: 80 kg (176 lb 5.9 oz) IBW/kg (Calculated) : 61.5   Vital Signs: Temp: 97.9 F (36.6 C) (02/18 1200) Temp Source: Oral (02/18 1200) BP: 139/95 (02/18 1600) Pulse Rate: 100 (02/18 1600)  Labs: Recent Labs    09/08/21 0915 09/08/21 0919 09/08/21 1031 09/08/21 1303 09/09/21 0411  HGB 12.2* 14.3  --   --  11.0*  HCT 38.9* 42.0  --   --  33.4*  PLT 242  --   --   --  229  APTT 27  --   --   --   --   LABPROT 14.7  --   --   --   --   INR 1.2  --   --   --   --   CREATININE 1.95* 1.90*  --   --  1.64*  TROPONINIHS  --   --  406* 312*  --     Estimated Creatinine Clearance: 53.1 mL/min (A) (by C-G formula based on SCr of 1.64 mg/dL (H)).   Medical History: Past Medical History:  Diagnosis Date   Asthma    Brain tumor ([place])     Medications:  Medications Prior to Admission  Medication Sig Dispense Refill Last Dose   ibuprofen (ADVIL) 200 MG tablet Take 400 mg by mouth every 6 (six) hours as needed (anxiety/panic attacks).   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 Pharmacy consulted to dose warfarin -INR= 1.2 on 2/17 -s/p TNK on 2/17 at ~ 9:30am   Goal of Therapy:  INR 2-3 Monitor platelets by  anticoagulation protocol: Yes   Plan:  -Warfarin 5mg  po today -Daily INR  Hildred Laser, PharmD Clinical Pharmacist **Pharmacist phone directory can now be found on [email or web address] ([email or web address]).  Listed under [place].

## 2021-09-09 NOTE — TOC Initial Note (Signed)
Transition of Care [facility]) - Initial/Assessment Note    Patient Details  Name: Craig Schmidt MRN: [id number] Date of Birth: [birth year]  [facility] of [facility]) CM/SW Contact:    Alfredia Ferguson, LCSW Phone Number: 09/09/2021, 4:26 PM  Clinical Narrative:                 CSW contacted bu PT/OT and RN regarding patient's sister wanting to speak with CSW and patient wanting CSW to speak with sister. CSW spoke with sister and noted concerns of patient having no insurance, schizophrenia, homelessness with multiple disability denials. Sister reports she is living with a friend and cannot help either at this time. Sister additionally reported history of probation. CSW is going to reach out to financial counseling for follow-up but anticipates this will be a difficult disposition at this time.   Expected Discharge Plan: Skilled Nursing Facility Barriers to Discharge: Inadequate or no insurance   Patient Goals and CMS Choice Patient states their goals for this hospitalization and ongoing recovery are:: "Just talk to my sister." CMS Medicare.gov Compare Post Acute Care list provided to:: Patient Choice offered to / list presented to : Patient, Sibling  Expected Discharge Plan and Services Expected Discharge Plan: [place] Choice: [place] Living arrangements for the past 2 months: Homeless                                      Prior Living Arrangements/Services Living arrangements for the past 2 months: Homeless Lives with:: Self Patient language and need for interpreter reviewed:: Yes Do you feel safe going back to the place where you live?: No      Need for Family Participation in Patient Care: No (Comment) Care giver support system in place?: No (comment)   Criminal Activity/Legal Involvement Pertinent to Current Situation/Hospitalization: No - Comment as needed  Activities of Daily Living      Permission  Sought/Granted Permission sought to share information with : Facility Sport and exercise psychologist, Family Supports Permission granted to share information with : Yes, Verbal Permission Granted              Emotional Assessment       Orientation: : Oriented to Self, Oriented to Place, Oriented to  Time, Oriented to Situation Alcohol / Substance Use: Illicit Drugs Psych Involvement: No (comment)  Admission diagnosis:  Stroke (cerebrum) ([place]) [I63.9] Acute ischemic stroke [facility]) [I63.9] Patient Active Problem List   Diagnosis Date Noted   Acute ischemic stroke ([place]) 09/08/2021   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[place]) 05/20/2015   Tear of MCL (medial collateral ligament) of knee 05/20/2015   Protein-calorie malnutrition, severe 05/17/2015   Lactic acidosis 05/15/2015   Left knee pain 05/15/2015   Lung nodules 05/15/2015   Hypoglycemia 05/14/2015   Hypothermia 05/14/2015   Acute encephalopathy 05/14/2015   Cocaine abuse with intoxication ([place]) 05/14/2015   Alcohol intoxication in active alcoholic ([place]) 36/64/4034   Sepsis ([place]) 05/14/2015   Homeless 05/14/2015   PCP:  Marliss Coots, NP Pharmacy:   [facility] [address] [place] [address] Phone: [phone] Fax: [phone]     Social Determinants of Health (SDOH) Interventions    Readmission Risk Interventions No flowsheet data found.

## 2021-09-09 NOTE — Evaluation (Signed)
Occupational Therapy Evaluation Patient Details Name: Craig Schmidt MRN: [id number] DOB: 12/27/1970 Today's Date: 09/09/2021   History of Present Illness Craig Schmidt is a 51 y.o. male presented to the emergency room for sudden onset of right-sided weakness. TNKase given. 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 PHMx:schwannoma, cocaine abuse which he says he has not used over the past few weeks.   Clinical Impression   This 51 yo male admitted with above presents to acute OT with PLOF of being homeless and independent with all basic ADLs and mobility. He currently is setup/S-total A for all basic ADLs and +2       Recommendations for follow up therapy are one component of a multi-disciplinary discharge planning process, led by the attending physician.  Recommendations may be updated based on patient status, additional functional criteria and insurance authorization.   Follow Up Recommendations  Skilled nursing-short term rehab (<3 hours/day)    Assistance Recommended at Discharge Frequent or constant Supervision/Assistance  Patient can return home with the following Two people to help with walking and/or transfers;Two people to help with bathing/dressing/bathroom;Assist for transportation;Assistance with cooking/housework;Direct supervision/assist for financial management;Direct supervision/assist for medications management    Functional Status Assessment  Patient has had a recent decline in their functional status and demonstrates the ability to make significant improvements in function in a reasonable and predictable amount of time.  Equipment Recommendations  Other (comment) (TBD next venue)       Precautions / Restrictions Precautions Precautions: Fall Restrictions Weight Bearing Restrictions: No      Mobility Bed  Mobility Overal bed mobility: Needs Assistance Bed Mobility: Sidelying to Sit, Sit to Sidelying   Sidelying to sit: Max assist, +2 for physical assistance     Sit to sidelying: Max assist, +2 for physical assistance General bed mobility comments: Mod A to roll left, total A to roll right    Transfers Overall transfer level: Needs assistance                 General transfer comment: lateral scoot up in bed--Mod A +2 (but would only attempt it twice due to pain in RLE from being down and foot on floor      Balance Overall balance assessment: Needs assistance Sitting-balance support: Feet supported, Single extremity supported Sitting balance-Leahy Scale: Fair                                     ADL either performed or assessed with clinical judgement   ADL Overall ADL's : Needs assistance/impaired Eating/Feeding: Set up;Bed level   Grooming: Moderate assistance;Bed level   Upper Body Bathing: Moderate assistance;Bed level   Lower Body Bathing: Total assistance;Bed level   Upper Body Dressing : Maximal assistance;Sitting Upper Body Dressing Details (indicate cue type and reason): EOB Lower Body Dressing: Total assistance;Bed level                       Vision Baseline Vision/History: 0 No visual deficits Additional Comments: "i think I really needed glasses before this happened to me"            Pertinent Vitals/Pain Pain Assessment Pain Assessment: 0-10 Pain Score: 10-Worst pain ever Pain Location: right leg when we had it resting on floor, then left leg once we assisted  him with laying back down Pain Descriptors / Indicators: Aching, Pins and needles Pain Intervention(s): Limited activity within patient's tolerance, Monitored during session, Repositioned     Hand Dominance Right   Extremity/Trunk Assessment Upper Extremity Assessment Upper Extremity Assessment: RUE deficits/detail RUE Deficits / Details: flaccid RUE Sensation:  decreased light touch RUE Coordination: decreased fine motor;decreased gross motor           Communication Communication Communication: No difficulties   Cognition Arousal/Alertness: Awake/alert Behavior During Therapy: Restless (due to pain) Overall Cognitive Status: Impaired/Different from baseline Area of Impairment: Following commands, Safety/judgement, Problem solving                       Following Commands: Follows one step commands inconsistently Safety/Judgement: Decreased awareness of safety, Decreased awareness of deficits   Problem Solving: Decreased initiation, Difficulty sequencing, Requires verbal cues, Requires tactile cues General Comments: At times we had to repeat what we wanted him to do or gesture/explain further                Home Living Family/patient expects to be discharged to:: Skilled nursing facility                                 Additional Comments: Pt is homeless and has been for ~30 years per his report      Prior Functioning/Environment Prior Level of Function : Independent/Modified Independent                        OT Problem List: Decreased strength;Decreased range of motion;Impaired balance (sitting and/or standing);Decreased activity tolerance;Impaired vision/perception;Decreased coordination;Decreased cognition;Decreased safety awareness;Impaired UE functional use;Impaired tone;Pain      OT Treatment/Interventions: Self-care/ADL training;DME and/or AE instruction;Patient/family education;Balance training;Therapeutic activities;Therapeutic exercise    OT Goals(Current goals can be found in the care plan section) Acute Rehab OT Goals Patient Stated Goal: to go to rehab then ALF OT Goal Formulation: With patient Time For Goal Achievement: 09/23/21 Potential to Achieve Goals: Good  OT Frequency: Min 2X/week    Co-evaluation PT/OT/SLP Co-Evaluation/Treatment: Yes Reason for Co-Treatment: For  patient/therapist safety;Necessary to address cognition/behavior during functional activity PT goals addressed during session: Balance;Mobility/safety with mobility;Strengthening/ROM OT goals addressed during session: Strengthening/ROM      AM-PAC OT "6 Clicks" Daily Activity     Outcome Measure Help from another person eating meals?: A Little Help from another person taking care of personal grooming?: A Lot Help from another person toileting, which includes using toliet, bedpan, or urinal?: Total Help from another person bathing (including washing, rinsing, drying)?: A Lot Help from another person to put on and taking off regular upper body clothing?: Total Help from another person to put on and taking off regular lower body clothing?: Total 6 Click Score: 10   End of Session    Activity Tolerance: Patient limited by pain Patient left: in bed;with call bell/phone within reach;with bed alarm set (NT in room, pt in "chair" position of bed)  OT Visit Diagnosis: Other abnormalities of gait and mobility (R26.89);Muscle weakness (generalized) (M62.81);Other symptoms and signs involving cognitive function;Hemiplegia and hemiparesis;Pain Hemiplegia - Right/Left: Right Hemiplegia - dominant/non-dominant: Dominant Pain - Right/Left:  (R>L) Pain - part of body: Leg                Time: 1330-1353 OT Time Calculation (min): 23 min Charges:  OT General Charges $OT Visit: 1  Visit OT Evaluation $OT Eval Moderate Complexity: 1 Mod  [address] [address], OTR/L Acute [employer] Pager [phone] Office [phone]    Almon Register 09/09/2021, 2:56 PM

## 2021-09-09 NOTE — Progress Notes (Signed)
Cardiology Progress Note  Patient ID: Craig Schmidt MRN: [id number] DOB: Apr 02, 1971 Date of Encounter: 09/09/2021  Primary Cardiologist: None  Subjective   Chief Complaint: R arm weakness   HPI: Denies shortness of breath.  Volume overload on exam.  Elevated JVD.  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  Mitral valve stenosis could clearly explain his stroke.  ROS:  All other ROS reviewed and negative. Pertinent positives noted in the HPI.     Inpatient Medications  Scheduled Meds:  atorvastatin  40 mg Oral Daily   carvedilol  12.5 mg Oral BID WC   Chlorhexidine Gluconate Cloth  6 each Topical Q0600   furosemide  40 mg Intravenous Once   [START ON 09/10/2021] influenza vac split quadrivalent PF  0.5 mL Intramuscular Tomorrow-1000   isosorbide-hydrALAZINE  1 tablet Oral TID   melatonin  3 mg Oral QHS   pantoprazole (PROTONIX) IV  40 mg Intravenous QHS   sodium chloride flush  3 mL Intravenous Once   Continuous Infusions:  sodium chloride 50 mL/hr at 09/09/21 0808   clevidipine 4 mg/hr (09/09/21 1051)   PRN Meds: acetaminophen **OR** acetaminophen (TYLENOL) oral liquid 160 mg/5 mL **OR** acetaminophen, senna-docusate   Vital Signs   Vitals:   09/09/21 0915 09/09/21 0930 09/09/21 0945 09/09/21 1000  BP: (!) 134/100 (!) 133/100 (!) 137/99 (!) 134/91  Pulse: 97 95 99 99  Resp: 19 19 (!) 38 20  Temp:      TempSrc:      SpO2: 98% 100% 99% 96%  Weight:      Height:        Intake/Output Summary (Last 24 hours) at 09/09/2021 1140 Last data filed at 09/09/2021 0800 Gross per 24 hour  Intake 1246.24 ml  Output 1325 ml  Net -78.76 ml   Last 3 Weights 09/08/2021 09/08/2021 10/03/2018  Weight (lbs) 176 lb 5.9 oz 176 lb 5.9 oz 160 lb  Weight (kg) 80 kg 80 kg 72.576 kg      Telemetry  Overnight telemetry shows sinus tachycardia heart rate in the low 100s, which I personally  reviewed.   ECG  The most recent ECG shows sinus rhythm heart rate 95, LVH with repolarization abnormality, PVCs noted, which I personally reviewed.   Physical Exam   Vitals:   09/09/21 0915 09/09/21 0930 09/09/21 0945 09/09/21 1000  BP: (!) 134/100 (!) 133/100 (!) 137/99 (!) 134/91  Pulse: 97 95 99 99  Resp: 19 19 (!) 38 20  Temp:      TempSrc:      SpO2: 98% 100% 99% 96%  Weight:      Height:        Intake/Output Summary (Last 24 hours) at 09/09/2021 1140 Last data filed at 09/09/2021 0800 Gross per 24 hour  Intake 1246.24 ml  Output 1325 ml  Net -78.76 ml    Last 3 Weights 09/08/2021 09/08/2021 10/03/2018  Weight (lbs) 176 lb 5.9 oz 176 lb 5.9 oz 160 lb  Weight (kg) 80 kg 80 kg 72.576 kg    Body mass index is 29.35 kg/m.   General: Well nourished, well developed, in no acute distress Head: Atraumatic, normal size  Eyes: PEERLA, EOMI  Neck: Supple, JVD 12 to 15 cm of water Endocrine: No thryomegaly Cardiac: Normal S1, S2; RRR; 2 out of 6 holosystolic murmur, diastolic rumble Lungs: Clear to auscultation bilaterally, no wheezing, rhonchi or rales  Abd:  Soft, nontender, no hepatomegaly  Ext: No edema, pulses 2+ Musculoskeletal: Weakness in the right upper and lower extremities Skin: Warm and dry, no rashes   Neuro: Alert and oriented to person, place, time, and situation, CNII-XII grossly intact, no focal deficits  Psych: Normal mood and affect   Labs  High Sensitivity Troponin:   Recent Labs  Lab 09/08/21 1031 09/08/21 1303  TROPONINIHS 406* 312*     Cardiac EnzymesNo results for input(s): TROPONINI in the last 168 hours. No results for input(s): TROPIPOC in the last 168 hours.  Chemistry Recent Labs  Lab 09/08/21 0915 09/08/21 0919 09/09/21 0411  NA 139 141 135  K 3.9 4.1 4.5  CL 107 106 105  CO2 22  --  21*  GLUCOSE 75 72 147*  BUN 19 27* 17  CREATININE 1.95* 1.90* 1.64*  CALCIUM 8.8*  --  8.2*  PROT 6.3*  --   --   ALBUMIN 2.9*  --   --   AST 59*   --   --   ALT 44  --   --   ALKPHOS 132*  --   --   BILITOT 0.7  --   --   GFRNONAA 41*  --  51*  ANIONGAP 10  --  9    Hematology Recent Labs  Lab 09/08/21 0915 09/08/21 0919 09/09/21 0411  WBC 10.4  --  10.1  RBC 4.12*  --  3.69*  HGB 12.2* 14.3 11.0*  HCT 38.9* 42.0 33.4*  MCV 94.4  --  90.5  MCH 29.6  --  29.8  MCHC 31.4  --  32.9  RDW 14.1  --  14.1  PLT 242  --  229   BNPNo results for input(s): BNP, PROBNP in the last 168 hours.  DDimer No results for input(s): DDIMER in the last 168 hours.   Radiology  CT HEAD WO CONTRAST (5MM)  Result Date: 09/09/2021 CLINICAL DATA:  51 year old male code stroke presentation yesterday. Acute bilateral cerebral and cerebellar infarcts on MRI. EXAM: CT HEAD WITHOUT CONTRAST TECHNIQUE: Contiguous axial images were obtained from the base of the skull through the vertex without intravenous contrast. RADIATION DOSE REDUCTION: This exam was performed according to the departmental dose-optimization program which includes automated exposure control, adjustment of the mA and/or kV according to patient size and/or use of iterative reconstruction technique. COMPARISON:  Brain MRI, head CT and CTA head and neck yesterday. FINDINGS: Brain: Increased conspicuity of cytotoxic edema corresponding to abnormal DWI yesterday, most pronounced in the posterior left MCA territory and not definitely extended since the MRI (series 3, images 18 through 24). No associated hemorrhage or mass effect. No ventriculomegaly. Deep gray matter nuclei and brainstem appear a stable. 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 Vascular: No suspicious intracranial vascular hyperdensity. Skull: No acute osseous abnormality identified. Sinuses/Orbits: Visualized paranasal sinuses and mastoids are stable and well aerated. Other: Disconjugate gaze. No other acute orbit or scalp soft tissue finding. IMPRESSION: 1. Expected CT appearance of the  acute cortical infarcts on MRI yesterday, most confluent in the posterior Left MCA territory. No hemorrhagic transformation or mass effect. 2. Stable left cerebellopontine angle vestibular schwannoma. Electronically Signed   By: Genevie Ann M.D.   On: 09/09/2021 10:29   MR BRAIN WO CONTRAST  Result Date: 09/08/2021 CLINICAL DATA:  Initial evaluation for neuro deficit, stroke suspected. EXAM: MRI HEAD WITHOUT CONTRAST TECHNIQUE: Multiplanar, multiecho pulse sequences of the brain and surrounding structures were obtained  without intravenous contrast. COMPARISON:  Prior CT and CTA from earlier the same day. Comparison also made with previous MRI from 05/17/2015. FINDINGS: Brain: Cerebral volume within normal limits for age. Mild scattered patchy subcentimeter T2/FLAIR hyperintensity noted involving the supratentorial cerebral white matter, nonspecific, but most like related chronic microvascular ischemic disease, mild in nature. Small remote cortical infarct with associated hemosiderin staining noted at the right occipital lobe. Additional tiny remote left cerebellar infarct noted. Patchy multifocal areas of restricted diffusion seen involving the left greater than right cerebral hemispheres, most pronounced at the posterior left frontal parietal region (series 5, image 86). Diffusion signal abnormality primarily involves the cortical gray matter. Few patchy foci in noted involving the bilateral cerebellar hemispheres as well. No associated hemorrhage or significant regional mass effect. A central thromboembolic etiology is suspected given the various vascular distributions involved. Otherwise, gray-white matter differentiation maintained. No other areas of chronic cortical infarction. No other evidence for acute or chronic intracranial hemorrhage. Known left vestibular schwannoma measures up to approximately 1.4 cm, similar to previous. No associated mass effect. Mega cisterna magna again noted. No other mass lesion,  midline shift or mass effect. No hydrocephalus or extra-axial fluid collection. Cavum et septum pellucidum again noted. Pituitary gland suprasellar region within normal limits. Midline structures intact. Vascular: Major intracranial vascular flow voids are maintained. Skull and upper cervical spine: Craniocervical junction within normal limits. Bone marrow signal intensity within normal limits. No scalp soft tissue abnormality. Sinuses/Orbits: Globes and orbital soft tissues within normal limits. Paranasal sinuses are largely clear. No mastoid effusion. Other: None. IMPRESSION: 1. Patchy multifocal acute ischemic nonhemorrhagic infarcts involving the left greater than right cerebral hemispheres and cerebellum. A central thromboembolic etiology is suspected given the various vascular distributions involved. 2. Underlying mild chronic microvascular ischemic disease with small remote right occipital and left cerebellar infarcts. 3. 1.4 cm left vestibular schwannoma, similar to previous. Electronically Signed   By: Jeannine Boga M.D.   On: 09/08/2021 23:46   DG Chest Port 1 View  Result Date: 09/08/2021 CLINICAL DATA:  Code stroke EXAM: PORTABLE CHEST 1 VIEW COMPARISON:  Portable exam 0950 hours compared to 11/20/2017 FINDINGS: Enlargement of cardiac silhouette with pulmonary vascular congestion. Mild perihilar infiltrate greater on RIGHT, likely pulmonary edema. Subsegmental atelectasis LEFT lower lobe. No pleural effusion or pneumothorax. Osseous structures unremarkable. IMPRESSION: Enlargement of cardiac silhouette with pulmonary vascular congestion and mild pulmonary edema. Mild subsegmental atelectasis LEFT lower lobe. Electronically Signed   By: Lavonia Dana M.D.   On: 09/08/2021 10:06   EEG adult  Result Date: 09/08/2021 Greta Doom, MD     09/08/2021 12:01 PM History: 51 yo M being evaluated for right sided weakness Sedation: none Technique: This EEG was acquired with electrodes placed  according to the International 10-20 electrode system (including Fp1, Fp2, F3, F4, C3, C4, P3, P4, O1, O2, T3, T4, T5, T6, A1, A2, Fz, Cz, Pz). The following electrodes were missing or displaced: none. Background: The background consists of intermixed alpha and beta activities. There is a well defined posterior dominant rhythm of 9 Hz that attenuates with eye opening. Sleep is recorded with normal appearing structures. Photic stimulation: Physiologic driving is not perofmred EEG Abnormalities: none Clinical Interpretation: This normal EEG is recorded in the waking and drowsy state. There was no seizure or seizure predisposition recorded on this study. Please note that lack of epileptiform activity on EEG does not preclude the possibility of epilepsy. Roland Rack, MD [facility] [phone] If 7pm- 7am,  please page neurology on call as listed in [facility].   ECHOCARDIOGRAM COMPLETE  Result Date: 09/08/2021    ECHOCARDIOGRAM REPORT   Patient Name:   Craig Schmidt Date of Exam: 09/08/2021 Medical Rec #:  [id number]          Height:       65.0 in Accession #:    [id number]         Weight:       176.4 lb Date of Birth:  06-20-1971           BSA:          1.875 m Patient Age:    25 years           BP:           127/93 mmHg Patient Gender: M                  HR:           99 bpm. Exam Location:  Inpatient Procedure: 2D Echo Indications:    Stroke  History:        Patient has no prior history of Echocardiogram examinations.  Sonographer:    Arlyss Gandy Referring Phys: [id number] ASHISH ARORA IMPRESSIONS  1. Left ventricular ejection fraction, by estimation, is 25 to 30%. The left ventricle has severely decreased function. The left ventricle demonstrates global hypokinesis. Left ventricular diastolic parameters are consistent with Grade II diastolic dysfunction (pseudonormalization).  2. Right ventricular systolic function is mildly reduced. The right ventricular size is normal. There is moderately  elevated pulmonary artery systolic pressure. The estimated right ventricular systolic pressure is 74.2 mmHg.  3. Left atrial size was mild to moderately dilated.  4. Right atrial size was mildly dilated.  5. The mitral valve is rheumatic. Mild to moderate mitral valve regurgitation. Moderate mitral stenosis. The mean mitral valve gradient is 6.0 mmHg with MVA 1.3 cm^2 by VTI.  6. Tricuspid valve regurgitation is moderate to severe.  7. The aortic valve is tricuspid. Aortic valve regurgitation is not visualized. Aortic valve sclerosis/calcification is present, without any evidence of aortic stenosis.  8. The inferior vena cava is dilated in size with <50% respiratory variability, suggesting right atrial pressure of 15 mmHg. FINDINGS  Left Ventricle: Left ventricular ejection fraction, by estimation, is 25 to 30%. The left ventricle has severely decreased function. The left ventricle demonstrates global hypokinesis. The left ventricular internal cavity size was normal in size. There is no left ventricular hypertrophy. Left ventricular diastolic parameters are consistent with Grade II diastolic dysfunction (pseudonormalization). Right Ventricle: The right ventricular size is normal. No increase in right ventricular wall thickness. Right ventricular systolic function is mildly reduced. There is moderately elevated pulmonary artery systolic pressure. The tricuspid regurgitant velocity is 2.95 m/s, and with an assumed right atrial pressure of 15 mmHg, the estimated right ventricular systolic pressure is 59.5 mmHg. Left Atrium: Left atrial size was mild to moderately dilated. Right Atrium: Right atrial size was mildly dilated. Pericardium: Trivial pericardial effusion is present. Mitral Valve: The mitral valve is rheumatic. There is mild calcification of the mitral valve leaflet(s). Mild mitral annular calcification. Mild to moderate mitral valve regurgitation. Moderate mitral valve stenosis. MV peak gradient, 15.7 mmHg.  The mean  mitral valve gradient is 6.0 mmHg. Tricuspid Valve: The tricuspid valve is normal in structure. Tricuspid valve regurgitation is moderate to severe. Aortic Valve: The aortic valve is tricuspid. Aortic valve regurgitation is not visualized. Aortic valve sclerosis/calcification is present, without  any evidence of aortic stenosis. Aortic valve mean gradient measures 3.0 mmHg. Aortic valve peak gradient measures 4.6 mmHg. Aortic valve area, by VTI measures 2.48 cm. Pulmonic Valve: The pulmonic valve was normal in structure. Pulmonic valve regurgitation is trivial. Aorta: The aortic root is normal in size and structure. Venous: The inferior vena cava is dilated in size with less than 50% respiratory variability, suggesting right atrial pressure of 15 mmHg. IAS/Shunts: No atrial level shunt detected by color flow Doppler.  LEFT VENTRICLE PLAX 2D LVIDd:         4.80 cm   Diastology LVIDs:         4.30 cm   LV e' medial:    4.22 cm/s LV PW:         1.10 cm   LV E/e' medial:  39.6 LV IVS:        1.10 cm   LV e' lateral:   7.38 cm/s LVOT diam:     2.06 cm   LV E/e' lateral: 22.6 LV SV:         45 LV SV Index:   24 LVOT Area:     3.33 cm  RIGHT VENTRICLE            IVC RV Basal diam:  4.70 cm    IVC diam: 2.20 cm RV Mid diam:    3.50 cm RV S prime:     8.76 cm/s TAPSE (M-mode): 1.6 cm LEFT ATRIUM             Index        RIGHT ATRIUM           Index LA diam:        3.90 cm 2.08 cm/m   RA Area:     22.80 cm LA Vol (A2C):   74.0 ml 39.46 ml/m  RA Volume:   72.90 ml  38.88 ml/m LA Vol (A4C):   66.8 ml 35.62 ml/m LA Biplane Vol: 73.8 ml 39.36 ml/m  AORTIC VALVE AV Area (Vmax):    2.62 cm AV Area (Vmean):   2.49 cm AV Area (VTI):     2.48 cm AV Vmax:           107.00 cm/s AV Vmean:          76.500 cm/s AV VTI:            0.180 m AV Peak Grad:      4.6 mmHg AV Mean Grad:      3.0 mmHg LVOT Vmax:         84.20 cm/s LVOT Vmean:        57.100 cm/s LVOT VTI:          0.134 m LVOT/AV VTI ratio: 0.74  AORTA Ao Root  diam: 2.70 cm Ao Asc diam:  2.40 cm MITRAL VALVE                TRICUSPID VALVE MV Area (PHT): 3.99 cm     TR Peak grad:   34.8 mmHg MV Peak grad:  15.7 mmHg    TR Vmax:        295.00 cm/s MV Mean grad:  6.0 mmHg MV Vmax:       1.98 m/s     SHUNTS MV Vmean:      113.0 cm/s   Systemic VTI:  0.13 m MV Decel Time: 190 msec     Systemic Diam: 2.06 cm MV E velocity: 167.00 cm/s MV A velocity: 108.00  cm/s MV E/A ratio:  1.55 Dalton McleanMD Electronically signed by Franki Monte Signature Date/Time: 09/08/2021/3:52:33 PM    Final    CT HEAD CODE STROKE WO CONTRAST  Result Date: 09/08/2021 CLINICAL DATA:  Code stroke.  52 year old male with weakness. EXAM: CT HEAD WITHOUT CONTRAST TECHNIQUE: Contiguous axial images were obtained from the base of the skull through the vertex without intravenous contrast. RADIATION DOSE REDUCTION: This exam was performed according to the departmental dose-optimization program which includes automated exposure control, adjustment of the mA and/or kV according to patient size and/or use of iterative reconstruction technique. COMPARISON:  Head CT 10/03/2018.  Brain MRI 09/16/2014. FINDINGS: Brain: Decreased cerebral volume since 2020 appears to be generalized and is age advanced. Cavum septum pellucidum. No ventriculomegaly. No midline shift. There is a chronic left cerebellopontine angle mass which appears not significantly changed from 2016 (series 2, image 10) most compatible with chronic left vestibular schwannoma. Several bulky dural calcifications along the tentorium are noted. Streak artifacts suspected along the right inferior frontal gyrus. No acute intracranial hemorrhage identified. No cortically based acute infarct identified. Gray-white matter differentiation within normal limits. Vascular: Mild Calcified atherosclerosis at the skull base. No suspicious intracranial vascular hyperdensity. Skull: No acute osseous abnormality identified. Sinuses/Orbits: Visualized paranasal  sinuses and mastoids are clear. Other: Disconjugate leftward gaze. Visualized scalp soft tissues are within normal limits. ASPECTS [facility] Stroke Program Early CT Score) Total score (0-10 with 10 being normal): 10 IMPRESSION: 1. No acute cortically based infarct or acute intracranial hemorrhage identified. ASPECTS 10. 2. Generalized cerebral volume loss with progression since 2020. Chronic left cerebellopontine angle vestibular schwannoma. 3. These results were communicated to Dr. Rory Percy at 9:27 am on 09/08/2021 by text page via the [facility] messaging system. Electronically Signed   By: Genevie Ann M.D.   On: 09/08/2021 09:27   CT ANGIO HEAD NECK W WO CM (CODE STROKE)  Result Date: 09/08/2021 CLINICAL DATA:  51 year old male code stroke presentation. Some mixed symptoms, but predominantly left MCA implicated. History of cocaine use. EXAM: CT ANGIOGRAPHY HEAD AND NECK TECHNIQUE: Multidetector CT imaging of the head and neck was performed using the standard protocol during bolus administration of intravenous contrast. Multiplanar CT image reconstructions and MIPs were obtained to evaluate the vascular anatomy. Carotid stenosis measurements (when applicable) are obtained utilizing NASCET criteria, using the distal internal carotid diameter as the denominator. RADIATION DOSE REDUCTION: This exam was performed according to the departmental dose-optimization program which includes automated exposure control, adjustment of the mA and/or kV according to patient size and/or use of iterative reconstruction technique. CONTRAST:  20mL OMNIPAQUE IOHEXOL 350 MG/ML SOLN COMPARISON:  Plain head CT 10/03/2018.  Brain MRI 05/17/2015. FINDINGS: CTA NECK Skeleton: No acute osseous abnormality identified. Upper chest: Low lung volumes with mild mosaic attenuation and septal thickening in the lung apices. Evidence of trace pleural fluid. Nonspecific increased mediastinal lymph nodes, such as 14 mm prevascular node on series 6, image 162.  Visible left axillary lymph nodes appear normal. Other neck: Negative.  No cervical lymphadenopathy identified. Aortic arch: 3 vessel arch configuration.  No arch atherosclerosis. Right carotid system: Negative; no atherosclerosis or stenosis. Left carotid system: Negative.  No atherosclerosis or stenosis. Vertebral arteries: Proximal right subclavian artery and right vertebral artery origin are normal. The right vertebral artery is diminutive but patent to the skull base with no stenosis identified. Proximal left subclavian artery and left vertebral artery origin are normal. Left vertebral artery appears dominant but still somewhat diminutive. Left  vertebrals patent to the skull base without plaque or stenosis. CTA HEAD Posterior circulation: Diffusely somewhat diminutive posterior circulation but no vertebrobasilar atherosclerosis or stenosis. Dominant left V4. Both PICA origins are patent. AICA, SCA and PCA origins are patent. Similar diminutive but patent PCAs without stenosis. Posterior communicating arteries are diminutive or absent. Anterior circulation: Diminutive anterior circulation appearance also. Both ICA siphons are patent. There is some left cavernous ICA calcified atherosclerosis suspected, but no significant siphon stenosis. Patent carotid termini, diminutive ACAs and MCAs. M1 segments and bifurcations are patent without stenosis. Bilateral visible ACA and MCA branches appear diminutive but otherwise within normal limits. Venous sinuses: Early contrast timing, not evaluated. Anatomic variants: Dominant left vertebral artery. Review of the MIP images confirms the above findings Vascular findings discussed by telephone with Dr. Amie Portland on 09/08/2021 at 09:48 . IMPRESSION: 1. Negative for large vessel occlusion, and only minimal atherosclerosis limited to the left ICA siphon. But diffusely diminutive anterior and posterior circulation vessel caliber is noted although nonspecific. 2. Enlarged but  nonspecific visible superior mediastinal lymph nodes. Some mosaic attenuation, septal thickening, and pleural fluid in the lung apices. Query CHF. Follow-up Chest CT may be valuable. 3. No neck mass or lymphadenopathy. Electronically Signed   By: Genevie Ann M.D.   On: 09/08/2021 09:57    Cardiac Studies  TTE 09/08/2021  1. Left ventricular ejection fraction, by estimation, is 25 to 30%. The  left ventricle has severely decreased function. The left ventricle  demonstrates global hypokinesis. Left ventricular diastolic parameters are  consistent with Grade II diastolic  dysfunction (pseudonormalization).   2. Right ventricular systolic function is mildly reduced. The right  ventricular size is normal. There is moderately elevated pulmonary artery  systolic pressure. The estimated right ventricular systolic pressure is  03.7 mmHg.   3. Left atrial size was mild to moderately dilated.   4. Right atrial size was mildly dilated.   5. The mitral valve is rheumatic. Mild to moderate mitral valve  regurgitation. Moderate mitral stenosis. The mean mitral valve gradient is  6.0 mmHg with MVA 1.3 cm^2 by VTI.   6. Tricuspid valve regurgitation is moderate to severe.   7. The aortic valve is tricuspid. Aortic valve regurgitation is not  visualized. Aortic valve sclerosis/calcification is present, without any  evidence of aortic stenosis.   8. The inferior vena cava is dilated in size with <50% respiratory  variability, suggesting right atrial pressure of 15 mmHg.   Patient Profile  Craig Schmidt is a 51 y.o. male with hypertension and cocaine abuse who was admitted 09/08/2021 with acute stroke.  He is status post IV TNKase.  Cardiology consulted for new onset systolic heart failure as well as mitral valve stenosis.  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  I suspect he has  severe mitral valve stenosis.  Valve is clearly rheumatic.  He has reduced hemodynamics with LVOT VTI of 13 cm.  This clearly could result in a gradient lower than expected (low flow low gradient) -Based on the appearance suspect there will be severe. -He does need a transesophageal echocardiogram.  Mitral valve area can be measured by direct 3D planimetry to further assess the severity of the mitral valve disease.  Again any mitral valve area below 1.5 cm is severe for rheumatic disease. -Moving forward anticoagulation will be indicated in the setting of stroke and rheumatic mitral valve  disease.  Coumadin is recommended.  This can be started once neurology is okay with this. -We will plan for transesophageal echo on Monday.  No difficulty swallowing.  Stroke deficits are right upper and lower extremities. -On statin.  Antiplatelets per neurology.  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 -Bigger issue may end up being his need for mitral valve surgery.  In setting of recent stroke probably not a great candidate.  Again favor medical management until he is recovered.  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  Denies any chest pain.  EKG with LVH. -Troponin minimally elevated and flat.  Suspect this is demand in the setting of uncontrolled hypertension. -Severe mitral valve stenosis does not explain LV dilation.  We actually would see the opposite.  He could have 2 processes.  He could have a dilated cardiomyopathy due to hypertension and/or cocaine abuse on top of severe rheumatic mitral valve stenosis.  This would explain a dilated LV chamber versus reduced diameter in the setting of severe mitral valve stenosis. -Slightly volume up.  We will give one-time dose of IV Lasix 40 mg. -Cocaine use is reported.  Okay to be on a beta-blocker.  Start Coreg 12.5 mg twice  daily. -Currently with AKI.  No ACE/ARB/Arni/MRA.  Start BiDil 20-37.5 mg 3 times daily. -Currently favor medical therapy in the setting of cocaine abuse.  Not a great candidate for invasive angiography with recent stroke.  We will probably need to see how he recovers.  Regardless we will continue with medical therapy.  #Elevated troponin -Suspect demand.  No chest pain. -Not acute coronary syndrome.  On statin.  #Cocaine use  -cessation advised   For questions or updates, please contact [facility] Please consult [email or web address] for contact info under     Signed, [place] Bells T. Audie Box, MD, [place]  09/09/2021 11:40 AM

## 2021-09-09 NOTE — H&P (View-Only) (Signed)
Cardiology Progress Note  Patient ID: Craig Schmidt MRN: [id number] DOB: Oct 20, 1970 Date of Encounter: 09/09/2021  Primary Cardiologist: None  Subjective   Chief Complaint: R arm weakness   HPI: Denies shortness of breath.  Volume overload on exam.  Elevated JVD.  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  Mitral valve stenosis could clearly explain his stroke.  ROS:  All other ROS reviewed and negative. Pertinent positives noted in the HPI.     Inpatient Medications  Scheduled Meds:  atorvastatin  40 mg Oral Daily   carvedilol  12.5 mg Oral BID WC   Chlorhexidine Gluconate Cloth  6 each Topical Q0600   furosemide  40 mg Intravenous Once   [START ON 09/10/2021] influenza vac split quadrivalent PF  0.5 mL Intramuscular Tomorrow-1000   isosorbide-hydrALAZINE  1 tablet Oral TID   melatonin  3 mg Oral QHS   pantoprazole (PROTONIX) IV  40 mg Intravenous QHS   sodium chloride flush  3 mL Intravenous Once   Continuous Infusions:  sodium chloride 50 mL/hr at 09/09/21 0808   clevidipine 4 mg/hr (09/09/21 1051)   PRN Meds: acetaminophen **OR** acetaminophen (TYLENOL) oral liquid 160 mg/5 mL **OR** acetaminophen, senna-docusate   Vital Signs   Vitals:   09/09/21 0915 09/09/21 0930 09/09/21 0945 09/09/21 1000  BP: (!) 134/100 (!) 133/100 (!) 137/99 (!) 134/91  Pulse: 97 95 99 99  Resp: 19 19 (!) 38 20  Temp:      TempSrc:      SpO2: 98% 100% 99% 96%  Weight:      Height:        Intake/Output Summary (Last 24 hours) at 09/09/2021 1140 Last data filed at 09/09/2021 0800 Gross per 24 hour  Intake 1246.24 ml  Output 1325 ml  Net -78.76 ml   Last 3 Weights 09/08/2021 09/08/2021 10/03/2018  Weight (lbs) 176 lb 5.9 oz 176 lb 5.9 oz 160 lb  Weight (kg) 80 kg 80 kg 72.576 kg      Telemetry  Overnight telemetry shows sinus tachycardia heart rate in the low 100s, which I personally  reviewed.   ECG  The most recent ECG shows sinus rhythm heart rate 95, LVH with repolarization abnormality, PVCs noted, which I personally reviewed.   Physical Exam   Vitals:   09/09/21 0915 09/09/21 0930 09/09/21 0945 09/09/21 1000  BP: (!) 134/100 (!) 133/100 (!) 137/99 (!) 134/91  Pulse: 97 95 99 99  Resp: 19 19 (!) 38 20  Temp:      TempSrc:      SpO2: 98% 100% 99% 96%  Weight:      Height:        Intake/Output Summary (Last 24 hours) at 09/09/2021 1140 Last data filed at 09/09/2021 0800 Gross per 24 hour  Intake 1246.24 ml  Output 1325 ml  Net -78.76 ml    Last 3 Weights 09/08/2021 09/08/2021 10/03/2018  Weight (lbs) 176 lb 5.9 oz 176 lb 5.9 oz 160 lb  Weight (kg) 80 kg 80 kg 72.576 kg    Body mass index is 29.35 kg/m.   General: Well nourished, well developed, in no acute distress Head: Atraumatic, normal size  Eyes: PEERLA, EOMI  Neck: Supple, JVD 12 to 15 cm of water Endocrine: No thryomegaly Cardiac: Normal S1, S2; RRR; 2 out of 6 holosystolic murmur, diastolic rumble Lungs: Clear to auscultation bilaterally, no wheezing, rhonchi or rales  Abd:  Soft, nontender, no hepatomegaly  Ext: No edema, pulses 2+ Musculoskeletal: Weakness in the right upper and lower extremities Skin: Warm and dry, no rashes   Neuro: Alert and oriented to person, place, time, and situation, CNII-XII grossly intact, no focal deficits  Psych: Normal mood and affect   Labs  High Sensitivity Troponin:   Recent Labs  Lab 09/08/21 1031 09/08/21 1303  TROPONINIHS 406* 312*     Cardiac EnzymesNo results for input(s): TROPONINI in the last 168 hours. No results for input(s): TROPIPOC in the last 168 hours.  Chemistry Recent Labs  Lab 09/08/21 0915 09/08/21 0919 09/09/21 0411  NA 139 141 135  K 3.9 4.1 4.5  CL 107 106 105  CO2 22  --  21*  GLUCOSE 75 72 147*  BUN 19 27* 17  CREATININE 1.95* 1.90* 1.64*  CALCIUM 8.8*  --  8.2*  PROT 6.3*  --   --   ALBUMIN 2.9*  --   --   AST 59*   --   --   ALT 44  --   --   ALKPHOS 132*  --   --   BILITOT 0.7  --   --   GFRNONAA 41*  --  51*  ANIONGAP 10  --  9    Hematology Recent Labs  Lab 09/08/21 0915 09/08/21 0919 09/09/21 0411  WBC 10.4  --  10.1  RBC 4.12*  --  3.69*  HGB 12.2* 14.3 11.0*  HCT 38.9* 42.0 33.4*  MCV 94.4  --  90.5  MCH 29.6  --  29.8  MCHC 31.4  --  32.9  RDW 14.1  --  14.1  PLT 242  --  229   BNPNo results for input(s): BNP, PROBNP in the last 168 hours.  DDimer No results for input(s): DDIMER in the last 168 hours.   Radiology  CT HEAD WO CONTRAST (5MM)  Result Date: 09/09/2021 CLINICAL DATA:  51 year old male code stroke presentation yesterday. Acute bilateral cerebral and cerebellar infarcts on MRI. EXAM: CT HEAD WITHOUT CONTRAST TECHNIQUE: Contiguous axial images were obtained from the base of the skull through the vertex without intravenous contrast. RADIATION DOSE REDUCTION: This exam was performed according to the departmental dose-optimization program which includes automated exposure control, adjustment of the mA and/or kV according to patient size and/or use of iterative reconstruction technique. COMPARISON:  Brain MRI, head CT and CTA head and neck yesterday. FINDINGS: Brain: Increased conspicuity of cytotoxic edema corresponding to abnormal DWI yesterday, most pronounced in the posterior left MCA territory and not definitely extended since the MRI (series 3, images 18 through 24). No associated hemorrhage or mass effect. No ventriculomegaly. Deep gray matter nuclei and brainstem appear a stable. 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 Vascular: No suspicious intracranial vascular hyperdensity. Skull: No acute osseous abnormality identified. Sinuses/Orbits: Visualized paranasal sinuses and mastoids are stable and well aerated. Other: Disconjugate gaze. No other acute orbit or scalp soft tissue finding. IMPRESSION: 1. Expected CT appearance of the  acute cortical infarcts on MRI yesterday, most confluent in the posterior Left MCA territory. No hemorrhagic transformation or mass effect. 2. Stable left cerebellopontine angle vestibular schwannoma. Electronically Signed   By: Genevie Ann M.D.   On: 09/09/2021 10:29   MR BRAIN WO CONTRAST  Result Date: 09/08/2021 CLINICAL DATA:  Initial evaluation for neuro deficit, stroke suspected. EXAM: MRI HEAD WITHOUT CONTRAST TECHNIQUE: Multiplanar, multiecho pulse sequences of the brain and surrounding structures were obtained  without intravenous contrast. COMPARISON:  Prior CT and CTA from earlier the same day. Comparison also made with previous MRI from 05/17/2015. FINDINGS: Brain: Cerebral volume within normal limits for age. Mild scattered patchy subcentimeter T2/FLAIR hyperintensity noted involving the supratentorial cerebral white matter, nonspecific, but most like related chronic microvascular ischemic disease, mild in nature. Small remote cortical infarct with associated hemosiderin staining noted at the right occipital lobe. Additional tiny remote left cerebellar infarct noted. Patchy multifocal areas of restricted diffusion seen involving the left greater than right cerebral hemispheres, most pronounced at the posterior left frontal parietal region (series 5, image 86). Diffusion signal abnormality primarily involves the cortical gray matter. Few patchy foci in noted involving the bilateral cerebellar hemispheres as well. No associated hemorrhage or significant regional mass effect. A central thromboembolic etiology is suspected given the various vascular distributions involved. Otherwise, gray-white matter differentiation maintained. No other areas of chronic cortical infarction. No other evidence for acute or chronic intracranial hemorrhage. Known left vestibular schwannoma measures up to approximately 1.4 cm, similar to previous. No associated mass effect. Mega cisterna magna again noted. No other mass lesion,  midline shift or mass effect. No hydrocephalus or extra-axial fluid collection. Cavum et septum pellucidum again noted. Pituitary gland suprasellar region within normal limits. Midline structures intact. Vascular: Major intracranial vascular flow voids are maintained. Skull and upper cervical spine: Craniocervical junction within normal limits. Bone marrow signal intensity within normal limits. No scalp soft tissue abnormality. Sinuses/Orbits: Globes and orbital soft tissues within normal limits. Paranasal sinuses are largely clear. No mastoid effusion. Other: None. IMPRESSION: 1. Patchy multifocal acute ischemic nonhemorrhagic infarcts involving the left greater than right cerebral hemispheres and cerebellum. A central thromboembolic etiology is suspected given the various vascular distributions involved. 2. Underlying mild chronic microvascular ischemic disease with small remote right occipital and left cerebellar infarcts. 3. 1.4 cm left vestibular schwannoma, similar to previous. Electronically Signed   By: Jeannine Boga M.D.   On: 09/08/2021 23:46   DG Chest Port 1 View  Result Date: 09/08/2021 CLINICAL DATA:  Code stroke EXAM: PORTABLE CHEST 1 VIEW COMPARISON:  Portable exam 0950 hours compared to 11/20/2017 FINDINGS: Enlargement of cardiac silhouette with pulmonary vascular congestion. Mild perihilar infiltrate greater on RIGHT, likely pulmonary edema. Subsegmental atelectasis LEFT lower lobe. No pleural effusion or pneumothorax. Osseous structures unremarkable. IMPRESSION: Enlargement of cardiac silhouette with pulmonary vascular congestion and mild pulmonary edema. Mild subsegmental atelectasis LEFT lower lobe. Electronically Signed   By: Lavonia Dana M.D.   On: 09/08/2021 10:06   EEG adult  Result Date: 09/08/2021 Greta Doom, MD     09/08/2021 12:01 PM History: 51 yo M being evaluated for right sided weakness Sedation: none Technique: This EEG was acquired with electrodes placed  according to the International 10-20 electrode system (including Fp1, Fp2, F3, F4, C3, C4, P3, P4, O1, O2, T3, T4, T5, T6, A1, A2, Fz, Cz, Pz). The following electrodes were missing or displaced: none. Background: The background consists of intermixed alpha and beta activities. There is a well defined posterior dominant rhythm of 9 Hz that attenuates with eye opening. Sleep is recorded with normal appearing structures. Photic stimulation: Physiologic driving is not perofmred EEG Abnormalities: none Clinical Interpretation: This normal EEG is recorded in the waking and drowsy state. There was no seizure or seizure predisposition recorded on this study. Please note that lack of epileptiform activity on EEG does not preclude the possibility of epilepsy. Roland Rack, MD [facility] [phone] If 7pm- 7am,  please page neurology on call as listed in [place].   ECHOCARDIOGRAM COMPLETE  Result Date: 09/08/2021    ECHOCARDIOGRAM REPORT   Patient Name:   WILMONT OLUND Date of Exam: 09/08/2021 Medical Rec #:  [id number]          Height:       65.0 in Accession #:    [id number]         Weight:       176.4 lb Date of Birth:  1970/09/17           BSA:          1.875 m Patient Age:    20 years           BP:           127/93 mmHg Patient Gender: M                  HR:           99 bpm. Exam Location:  Inpatient Procedure: 2D Echo Indications:    Stroke  History:        Patient has no prior history of Echocardiogram examinations.  Sonographer:    Arlyss Gandy Referring Phys: [id number] ASHISH ARORA IMPRESSIONS  1. Left ventricular ejection fraction, by estimation, is 25 to 30%. The left ventricle has severely decreased function. The left ventricle demonstrates global hypokinesis. Left ventricular diastolic parameters are consistent with Grade II diastolic dysfunction (pseudonormalization).  2. Right ventricular systolic function is mildly reduced. The right ventricular size is normal. There is moderately  elevated pulmonary artery systolic pressure. The estimated right ventricular systolic pressure is 63.1 mmHg.  3. Left atrial size was mild to moderately dilated.  4. Right atrial size was mildly dilated.  5. The mitral valve is rheumatic. Mild to moderate mitral valve regurgitation. Moderate mitral stenosis. The mean mitral valve gradient is 6.0 mmHg with MVA 1.3 cm^2 by VTI.  6. Tricuspid valve regurgitation is moderate to severe.  7. The aortic valve is tricuspid. Aortic valve regurgitation is not visualized. Aortic valve sclerosis/calcification is present, without any evidence of aortic stenosis.  8. The inferior vena cava is dilated in size with <50% respiratory variability, suggesting right atrial pressure of 15 mmHg. FINDINGS  Left Ventricle: Left ventricular ejection fraction, by estimation, is 25 to 30%. The left ventricle has severely decreased function. The left ventricle demonstrates global hypokinesis. The left ventricular internal cavity size was normal in size. There is no left ventricular hypertrophy. Left ventricular diastolic parameters are consistent with Grade II diastolic dysfunction (pseudonormalization). Right Ventricle: The right ventricular size is normal. No increase in right ventricular wall thickness. Right ventricular systolic function is mildly reduced. There is moderately elevated pulmonary artery systolic pressure. The tricuspid regurgitant velocity is 2.95 m/s, and with an assumed right atrial pressure of 15 mmHg, the estimated right ventricular systolic pressure is 49.7 mmHg. Left Atrium: Left atrial size was mild to moderately dilated. Right Atrium: Right atrial size was mildly dilated. Pericardium: Trivial pericardial effusion is present. Mitral Valve: The mitral valve is rheumatic. There is mild calcification of the mitral valve leaflet(s). Mild mitral annular calcification. Mild to moderate mitral valve regurgitation. Moderate mitral valve stenosis. MV peak gradient, 15.7 mmHg.  The mean  mitral valve gradient is 6.0 mmHg. Tricuspid Valve: The tricuspid valve is normal in structure. Tricuspid valve regurgitation is moderate to severe. Aortic Valve: The aortic valve is tricuspid. Aortic valve regurgitation is not visualized. Aortic valve sclerosis/calcification is present, without  any evidence of aortic stenosis. Aortic valve mean gradient measures 3.0 mmHg. Aortic valve peak gradient measures 4.6 mmHg. Aortic valve area, by VTI measures 2.48 cm. Pulmonic Valve: The pulmonic valve was normal in structure. Pulmonic valve regurgitation is trivial. Aorta: The aortic root is normal in size and structure. Venous: The inferior vena cava is dilated in size with less than 50% respiratory variability, suggesting right atrial pressure of 15 mmHg. IAS/Shunts: No atrial level shunt detected by color flow Doppler.  LEFT VENTRICLE PLAX 2D LVIDd:         4.80 cm   Diastology LVIDs:         4.30 cm   LV e' medial:    4.22 cm/s LV PW:         1.10 cm   LV E/e' medial:  39.6 LV IVS:        1.10 cm   LV e' lateral:   7.38 cm/s LVOT diam:     2.06 cm   LV E/e' lateral: 22.6 LV SV:         45 LV SV Index:   24 LVOT Area:     3.33 cm  RIGHT VENTRICLE            IVC RV Basal diam:  4.70 cm    IVC diam: 2.20 cm RV Mid diam:    3.50 cm RV S prime:     8.76 cm/s TAPSE (M-mode): 1.6 cm LEFT ATRIUM             Index        RIGHT ATRIUM           Index LA diam:        3.90 cm 2.08 cm/m   RA Area:     22.80 cm LA Vol (A2C):   74.0 ml 39.46 ml/m  RA Volume:   72.90 ml  38.88 ml/m LA Vol (A4C):   66.8 ml 35.62 ml/m LA Biplane Vol: 73.8 ml 39.36 ml/m  AORTIC VALVE AV Area (Vmax):    2.62 cm AV Area (Vmean):   2.49 cm AV Area (VTI):     2.48 cm AV Vmax:           107.00 cm/s AV Vmean:          76.500 cm/s AV VTI:            0.180 m AV Peak Grad:      4.6 mmHg AV Mean Grad:      3.0 mmHg LVOT Vmax:         84.20 cm/s LVOT Vmean:        57.100 cm/s LVOT VTI:          0.134 m LVOT/AV VTI ratio: 0.74  AORTA Ao Root  diam: 2.70 cm Ao Asc diam:  2.40 cm MITRAL VALVE                TRICUSPID VALVE MV Area (PHT): 3.99 cm     TR Peak grad:   34.8 mmHg MV Peak grad:  15.7 mmHg    TR Vmax:        295.00 cm/s MV Mean grad:  6.0 mmHg MV Vmax:       1.98 m/s     SHUNTS MV Vmean:      113.0 cm/s   Systemic VTI:  0.13 m MV Decel Time: 190 msec     Systemic Diam: 2.06 cm MV E velocity: 167.00 cm/s MV A velocity: 108.00  cm/s MV E/A ratio:  1.55 Dalton McleanMD Electronically signed by Franki Monte Signature Date/Time: 09/08/2021/3:52:33 PM    Final    CT HEAD CODE STROKE WO CONTRAST  Result Date: 09/08/2021 CLINICAL DATA:  Code stroke.  51 year old male with weakness. EXAM: CT HEAD WITHOUT CONTRAST TECHNIQUE: Contiguous axial images were obtained from the base of the skull through the vertex without intravenous contrast. RADIATION DOSE REDUCTION: This exam was performed according to the departmental dose-optimization program which includes automated exposure control, adjustment of the mA and/or kV according to patient size and/or use of iterative reconstruction technique. COMPARISON:  Head CT 10/03/2018.  Brain MRI 09/16/2014. FINDINGS: Brain: Decreased cerebral volume since 2020 appears to be generalized and is age advanced. Cavum septum pellucidum. No ventriculomegaly. No midline shift. There is a chronic left cerebellopontine angle mass which appears not significantly changed from 2016 (series 2, image 10) most compatible with chronic left vestibular schwannoma. Several bulky dural calcifications along the tentorium are noted. Streak artifacts suspected along the right inferior frontal gyrus. No acute intracranial hemorrhage identified. No cortically based acute infarct identified. Gray-white matter differentiation within normal limits. Vascular: Mild Calcified atherosclerosis at the skull base. No suspicious intracranial vascular hyperdensity. Skull: No acute osseous abnormality identified. Sinuses/Orbits: Visualized paranasal  sinuses and mastoids are clear. Other: Disconjugate leftward gaze. Visualized scalp soft tissues are within normal limits. ASPECTS [facility] Stroke Program Early CT Score) Total score (0-10 with 10 being normal): 10 IMPRESSION: 1. No acute cortically based infarct or acute intracranial hemorrhage identified. ASPECTS 10. 2. Generalized cerebral volume loss with progression since 2020. Chronic left cerebellopontine angle vestibular schwannoma. 3. These results were communicated to Dr. Rory Percy at 9:27 am on 09/08/2021 by text page via the [facility] messaging system. Electronically Signed   By: Genevie Ann M.D.   On: 09/08/2021 09:27   CT ANGIO HEAD NECK W WO CM (CODE STROKE)  Result Date: 09/08/2021 CLINICAL DATA:  51 year old male code stroke presentation. Some mixed symptoms, but predominantly left MCA implicated. History of cocaine use. EXAM: CT ANGIOGRAPHY HEAD AND NECK TECHNIQUE: Multidetector CT imaging of the head and neck was performed using the standard protocol during bolus administration of intravenous contrast. Multiplanar CT image reconstructions and MIPs were obtained to evaluate the vascular anatomy. Carotid stenosis measurements (when applicable) are obtained utilizing NASCET criteria, using the distal internal carotid diameter as the denominator. RADIATION DOSE REDUCTION: This exam was performed according to the departmental dose-optimization program which includes automated exposure control, adjustment of the mA and/or kV according to patient size and/or use of iterative reconstruction technique. CONTRAST:  84mL OMNIPAQUE IOHEXOL 350 MG/ML SOLN COMPARISON:  Plain head CT 10/03/2018.  Brain MRI 05/17/2015. FINDINGS: CTA NECK Skeleton: No acute osseous abnormality identified. Upper chest: Low lung volumes with mild mosaic attenuation and septal thickening in the lung apices. Evidence of trace pleural fluid. Nonspecific increased mediastinal lymph nodes, such as 14 mm prevascular node on series 6, image 162.  Visible left axillary lymph nodes appear normal. Other neck: Negative.  No cervical lymphadenopathy identified. Aortic arch: 3 vessel arch configuration.  No arch atherosclerosis. Right carotid system: Negative; no atherosclerosis or stenosis. Left carotid system: Negative.  No atherosclerosis or stenosis. Vertebral arteries: Proximal right subclavian artery and right vertebral artery origin are normal. The right vertebral artery is diminutive but patent to the skull base with no stenosis identified. Proximal left subclavian artery and left vertebral artery origin are normal. Left vertebral artery appears dominant but still somewhat diminutive. Left  vertebrals patent to the skull base without plaque or stenosis. CTA HEAD Posterior circulation: Diffusely somewhat diminutive posterior circulation but no vertebrobasilar atherosclerosis or stenosis. Dominant left V4. Both PICA origins are patent. AICA, SCA and PCA origins are patent. Similar diminutive but patent PCAs without stenosis. Posterior communicating arteries are diminutive or absent. Anterior circulation: Diminutive anterior circulation appearance also. Both ICA siphons are patent. There is some left cavernous ICA calcified atherosclerosis suspected, but no significant siphon stenosis. Patent carotid termini, diminutive ACAs and MCAs. M1 segments and bifurcations are patent without stenosis. Bilateral visible ACA and MCA branches appear diminutive but otherwise within normal limits. Venous sinuses: Early contrast timing, not evaluated. Anatomic variants: Dominant left vertebral artery. Review of the MIP images confirms the above findings Vascular findings discussed by telephone with Dr. Amie Portland on 09/08/2021 at 09:48 . IMPRESSION: 1. Negative for large vessel occlusion, and only minimal atherosclerosis limited to the left ICA siphon. But diffusely diminutive anterior and posterior circulation vessel caliber is noted although nonspecific. 2. Enlarged but  nonspecific visible superior mediastinal lymph nodes. Some mosaic attenuation, septal thickening, and pleural fluid in the lung apices. Query CHF. Follow-up Chest CT may be valuable. 3. No neck mass or lymphadenopathy. Electronically Signed   By: Genevie Ann M.D.   On: 09/08/2021 09:57    Cardiac Studies  TTE 09/08/2021  1. Left ventricular ejection fraction, by estimation, is 25 to 30%. The  left ventricle has severely decreased function. The left ventricle  demonstrates global hypokinesis. Left ventricular diastolic parameters are  consistent with Grade II diastolic  dysfunction (pseudonormalization).   2. Right ventricular systolic function is mildly reduced. The right  ventricular size is normal. There is moderately elevated pulmonary artery  systolic pressure. The estimated right ventricular systolic pressure is  31.5 mmHg.   3. Left atrial size was mild to moderately dilated.   4. Right atrial size was mildly dilated.   5. The mitral valve is rheumatic. Mild to moderate mitral valve  regurgitation. Moderate mitral stenosis. The mean mitral valve gradient is  6.0 mmHg with MVA 1.3 cm^2 by VTI.   6. Tricuspid valve regurgitation is moderate to severe.   7. The aortic valve is tricuspid. Aortic valve regurgitation is not  visualized. Aortic valve sclerosis/calcification is present, without any  evidence of aortic stenosis.   8. The inferior vena cava is dilated in size with <50% respiratory  variability, suggesting right atrial pressure of 15 mmHg.   Patient Profile  BEAUMONT AUSTAD is a 51 y.o. male with hypertension and cocaine abuse who was admitted 09/08/2021 with acute stroke.  He is status post IV TNKase.  Cardiology consulted for new onset systolic heart failure as well as mitral valve stenosis.  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  I suspect he has  severe mitral valve stenosis.  Valve is clearly rheumatic.  He has reduced hemodynamics with LVOT VTI of 13 cm.  This clearly could result in a gradient lower than expected (low flow low gradient) -Based on the appearance suspect there will be severe. -He does need a transesophageal echocardiogram.  Mitral valve area can be measured by direct 3D planimetry to further assess the severity of the mitral valve disease.  Again any mitral valve area below 1.5 cm is severe for rheumatic disease. -Moving forward anticoagulation will be indicated in the setting of stroke and rheumatic mitral valve  disease.  Coumadin is recommended.  This can be started once neurology is okay with this. -We will plan for transesophageal echo on Monday.  No difficulty swallowing.  Stroke deficits are right upper and lower extremities. -On statin.  Antiplatelets per neurology.  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 -Bigger issue may end up being his need for mitral valve surgery.  In setting of recent stroke probably not a great candidate.  Again favor medical management until he is recovered.  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  Denies any chest pain.  EKG with LVH. -Troponin minimally elevated and flat.  Suspect this is demand in the setting of uncontrolled hypertension. -Severe mitral valve stenosis does not explain LV dilation.  We actually would see the opposite.  He could have 2 processes.  He could have a dilated cardiomyopathy due to hypertension and/or cocaine abuse on top of severe rheumatic mitral valve stenosis.  This would explain a dilated LV chamber versus reduced diameter in the setting of severe mitral valve stenosis. -Slightly volume up.  We will give one-time dose of IV Lasix 40 mg. -Cocaine use is reported.  Okay to be on a beta-blocker.  Start Coreg 12.5 mg twice  daily. -Currently with AKI.  No ACE/ARB/Arni/MRA.  Start BiDil 20-37.5 mg 3 times daily. -Currently favor medical therapy in the setting of cocaine abuse.  Not a great candidate for invasive angiography with recent stroke.  We will probably need to see how he recovers.  Regardless we will continue with medical therapy.  #Elevated troponin -Suspect demand.  No chest pain. -Not acute coronary syndrome.  On statin.  #Cocaine use  -cessation advised   For questions or updates, please contact [place] Please consult [email or web address] for contact info under     Signed, [place] T. Audie Box, MD, [place]  09/09/2021 11:40 AM

## 2021-09-09 NOTE — Progress Notes (Addendum)
STROKE TEAM PROGRESS NOTE   INTERVAL HISTORY Patient is seen in his room with no family at the bedside.  Yesterday, he was admitted with sudden onset right sided weakness and slurred speech.  He was given TNK in the ED.  No LVO was seen on CTA head, so thrombectomy could not be performed.  Vitals:   09/09/21 1400 09/09/21 1415 09/09/21 1430 09/09/21 1445  BP: (!) 137/100 (!) 131/101 (!) 141/92 (!) 143/100  Pulse:  (!) 102 (!) 105 (!) 107  Resp: (!) 23 20 20 18   Temp:      TempSrc:      SpO2:  100% 100% 100%  Weight:      Height:       CBC:  Recent Labs  Lab 09/08/21 0915 09/08/21 0919 09/09/21 0411  WBC 10.4  --  10.1  NEUTROABS 7.6  --   --   HGB 12.2* 14.3 11.0*  HCT 38.9* 42.0 33.4*  MCV 94.4  --  90.5  PLT 242  --  440   Basic Metabolic Panel:  Recent Labs  Lab 09/08/21 0915 09/08/21 0919 09/09/21 0411  NA 139 141 135  K 3.9 4.1 4.5  CL 107 106 105  CO2 22  --  21*  GLUCOSE 75 72 147*  BUN 19 27* 17  CREATININE 1.95* 1.90* 1.64*  CALCIUM 8.8*  --  8.2*   Lipid Panel:  Recent Labs  Lab 09/09/21 0411  CHOL 110  TRIG 60   61  HDL 21*  CHOLHDL 5.2  VLDL 12  LDLCALC 77   HgbA1c:  Recent Labs  Lab 09/09/21 0411  HGBA1C 5.6   Urine Drug Screen:  Recent Labs  Lab 09/08/21 1247  LABOPIA NONE DETECTED  COCAINSCRNUR POSITIVE*  LABBENZ NONE DETECTED  AMPHETMU NONE DETECTED  THCU NONE DETECTED  LABBARB NONE DETECTED    Alcohol Level  Recent Labs  Lab 09/08/21 0915  ETH <10    IMAGING past 24 hours CT HEAD WO CONTRAST (5MM)  Result Date: 09/09/2021 CLINICAL DATA:  51 year old male code stroke presentation yesterday. Acute bilateral cerebral and cerebellar infarcts on MRI. EXAM: CT HEAD WITHOUT CONTRAST TECHNIQUE: Contiguous axial images were obtained from the base of the skull through the vertex without intravenous contrast. RADIATION DOSE REDUCTION: This exam was performed according to the departmental dose-optimization program which includes  automated exposure control, adjustment of the mA and/or kV according to patient size and/or use of iterative reconstruction technique. COMPARISON:  Brain MRI, head CT and CTA head and neck yesterday. FINDINGS: Brain: Increased conspicuity of cytotoxic edema corresponding to abnormal DWI yesterday, most pronounced in the posterior left MCA territory and not definitely extended since the MRI (series 3, images 18 through 24). No associated hemorrhage or mass effect. No ventriculomegaly. Deep gray matter nuclei and brainstem appear a stable. 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 Vascular: No suspicious intracranial vascular hyperdensity. Skull: No acute osseous abnormality identified. Sinuses/Orbits: Visualized paranasal sinuses and mastoids are stable and well aerated. Other: Disconjugate gaze. No other acute orbit or scalp soft tissue finding. IMPRESSION: 1. Expected CT appearance of the acute cortical infarcts on MRI yesterday, most confluent in the posterior Left MCA territory. No hemorrhagic transformation or mass effect. 2. Stable left cerebellopontine angle vestibular schwannoma. Electronically Signed   By: Genevie Ann M.D.   On: 09/09/2021 10:29   MR BRAIN WO CONTRAST  Result Date: 09/08/2021 CLINICAL DATA:  Initial evaluation for neuro deficit, stroke suspected.  EXAM: MRI HEAD WITHOUT CONTRAST TECHNIQUE: Multiplanar, multiecho pulse sequences of the brain and surrounding structures were obtained without intravenous contrast. COMPARISON:  Prior CT and CTA from earlier the same day. Comparison also made with previous MRI from 05/17/2015. FINDINGS: Brain: Cerebral volume within normal limits for age. Mild scattered patchy subcentimeter T2/FLAIR hyperintensity noted involving the supratentorial cerebral white matter, nonspecific, but most like related chronic microvascular ischemic disease, mild in nature. Small remote cortical infarct with associated hemosiderin  staining noted at the right occipital lobe. Additional tiny remote left cerebellar infarct noted. Patchy multifocal areas of restricted diffusion seen involving the left greater than right cerebral hemispheres, most pronounced at the posterior left frontal parietal region (series 5, image 86). Diffusion signal abnormality primarily involves the cortical gray matter. Few patchy foci in noted involving the bilateral cerebellar hemispheres as well. No associated hemorrhage or significant regional mass effect. A central thromboembolic etiology is suspected given the various vascular distributions involved. Otherwise, gray-white matter differentiation maintained. No other areas of chronic cortical infarction. No other evidence for acute or chronic intracranial hemorrhage. Known left vestibular schwannoma measures up to approximately 1.4 cm, similar to previous. No associated mass effect. Mega cisterna magna again noted. No other mass lesion, midline shift or mass effect. No hydrocephalus or extra-axial fluid collection. Cavum et septum pellucidum again noted. Pituitary gland suprasellar region within normal limits. Midline structures intact. Vascular: Major intracranial vascular flow voids are maintained. Skull and upper cervical spine: Craniocervical junction within normal limits. Bone marrow signal intensity within normal limits. No scalp soft tissue abnormality. Sinuses/Orbits: Globes and orbital soft tissues within normal limits. Paranasal sinuses are largely clear. No mastoid effusion. Other: None. IMPRESSION: 1. Patchy multifocal acute ischemic nonhemorrhagic infarcts involving the left greater than right cerebral hemispheres and cerebellum. A central thromboembolic etiology is suspected given the various vascular distributions involved. 2. Underlying mild chronic microvascular ischemic disease with small remote right occipital and left cerebellar infarcts. 3. 1.4 cm left vestibular schwannoma, similar to previous.  Electronically Signed   By: Jeannine Boga M.D.   On: 09/08/2021 23:46    PHYSICAL EXAM General:  Alert, well-developed, well-nourished patient in no acute distress   NEURO:  Mental Status: AA&Ox3  Speech/Language: speech is without dysarthria or aphasia.  Naming, repetition, fluency, and comprehension intact.  Cranial Nerves:  II: PERRL. Visual fields full.  III, IV, VI: EOMI. Eyelids elevate symmetrically.  V: right sided facial numbness present VII: right sided facial droop  VIII: hearing intact to voice. IX, X:  Phonation is normal.  XII: tongue is midline without fasciculations. Motor: 5/5 strength to LUE and LLE, 0/5 strength to RUE and 3/5 strength to RLE Tone: is normal and bulk is normal Sensation- right sided numbness present Coordination: FTN intact on left Gait- deferred  ASSESSMENT/PLAN Mr. MALCOM SELMER is a 51 y.o. male with history of schwannoma, cocaine use and smoking presenting with sudden onset right sided weakness and slurred speech.  He was given TNK in the ED.  No LVO was seen on CTA head, so thrombectomy could not be performed.  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 Generalized volume loss and left cerebellopontine angle vestibular schwannoma ASPECTS 10.    CTA head & neck negative for LVO MRI  patchy  multifocal acute ischemic infarcts in left greater than right cerebral hemispheres and cerebellum, chronic microvascular ischemic disease, 1.4 cm left vestibular schwannoma 2D Echo EF 25-30%, global left ventricular hypokinesis, grade 2 diastolic dysfunction, rheumatic mitral valve with moderate mitral stenosis, moderate to severe tricuspid regurgitation, dilated right and left atria, no atrial level shunt LDL  77 HgbA1c 5.6 VTE prophylaxis - lovenox No antithrombotic prior to admission, now on warfarin daily and ASA 325. Once INR at goal, will d/c ASA.  Therapy recommendations:  SNF Disposition:  pending  Hypertension Home meds:  none Unstable Keep BP <180/105 Cleviprex as needed BiDil and Coreg started by cardiology Long-term BP goal normotensive  Hyperlipidemia Home meds:  none LDL 77, goal < 70 Add atorvastatin 40 mg daily  Continue statin at discharge  CHF EF 25-30% on TTE Likely hypertensive cardiomyopathy Cardiology consulted, appreciate recommendations TEE Monday On coumadin  Rheumatic mitral valve disease Rheumatic mitral stenosis seen on TTE Cardiology consulted, appreciate recommendations TEE Monday On coumadin  Other Stroke Risk Factors Cigarette smoker advised to stop smoking, nicotine patch ordered Substance abuse - UDS: Cocaine POSITIVE. Patient advised to stop using due to stroke risk.  Other Active Problems Cocaine use OK to use beta blocker per cardiology Advised cessation  Hospital day # [place] , MSN, AGACNP-BC [facility] See Amion for schedule and pager information 09/09/2021 3:47 PM   ATTENDING NOTE: I reviewed above note and agree with the assessment and plan. Pt was seen and examined.   51 year old male with history of cocaine abuse, left small acoustic neuroma admitted for right-sided weakness, slurred speech, right facial droop.  CT no acute abnormality.  Status post TNK.  CTA head and neck no LVO.  MRI showed embolic shower.  EF 25 to 30%, EEG negative.  LDL 77, A1c 5.6, UDS positive for cocaine.  Repeat CT today showed no hemorrhagic transformation.  Creatinine 1.95-1.90.  On exam, patient awake alert oriented x3, no aphasia, follows simple commands.  No gaze palsy, visual field full, right facial droop with right upper extremity plegia and right lower extremity paresis.  Sensation decreased on the right.  Left  finger-to-nose intact.  Etiology for patient embolic strokes likely due to severe cardiomyopathy in the setting of chronic cocaine use.  Cardiology consulted for cardiomyopathy with low EF and rheumatic mitral valve stenosis.  Currently on Coreg and BiDil.  Plan for TEE on Monday.  Agree with Coumadin for anticoagulation and stroke prevention.  Currently on aspirin 325, once INR at goal, aspirin can be discontinued.  Taper off Cleviprex as able.  BP goal less than 180/105.  Not on Lipitor 40.  Patient eating well, DC IV fluid.  For detailed assessment and plan, please refer to above as I have made changes wherever appropriate.   [name], MD PhD Stroke Neurology 09/09/2021 7:41 PM  This patient is critically ill due to embolic shower, severe cardiomyopathy with low EF, rheumatic mitral stenosis, cocaine abuse, hypertensive emergency and at significant risk of neurological worsening, death form recurrent stroke, hemorrhagic transformation, heart failure, hypertensive encephalopathy. This patient's care requires constant monitoring of vital signs, hemodynamics, respiratory and cardiac monitoring, review of multiple databases, neurological assessment, discussion with family, other specialists and medical decision making of high complexity. I spent 35 minutes of neurocritical care time in the care of this patient.    To contact Stroke Continuity provider, please refer to [email or web address] After hours, contact General Neurology

## 2021-09-09 NOTE — Plan of Care (Signed)
?  Problem: Education: ?Goal: Knowledge of disease or condition will improve ?Outcome: Progressing ?Goal: Knowledge of secondary prevention will improve (SELECT ALL) ?Outcome: Progressing ?Goal: Knowledge of patient specific risk factors will improve (INDIVIDUALIZE FOR PATIENT) ?Outcome: Progressing ?  ?

## 2021-09-09 NOTE — Evaluation (Signed)
Physical Therapy Evaluation Patient Details Name: Craig Schmidt MRN: [id number] DOB: May 24, 1971 Today's Date: 09/09/2021  History of Present Illness  Craig Schmidt is a 51 y.o. male presented to the emergency room for sudden onset of right-sided weakness. TNKase given. 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 PHMx:schwannoma, cocaine abuse which he says he has not used over the past few weeks.  Clinical Impression  Pt presents to PT with deficits in R sided strength, sensation, cognition, balance, functional mobility. Pt reports significant RLE pain, pins and needles, when in supine. Later reports a throbbing pain in RLE when sitting, and upon return to supine reports a pain in LLE. Pain limiting mobility this session as pt refuses attempts at standing. Pt has difficulty following commands and sequencing mobility at this time. Pt is at a high risk for falls due to RLE weakness and safety awareness deficits. Pt will benefit from continued acute PT services in an effort to aide in a return to independence. Pt is unable to identify a location for discharge, reporting he is homeless.       Recommendations for follow up therapy are one component of a multi-disciplinary discharge planning process, led by the attending physician.  Recommendations may be updated based on patient status, additional functional criteria and insurance authorization.  Follow Up Recommendations Skilled nursing-short term rehab (<3 hours/day)    Assistance Recommended at Discharge Intermittent Supervision/Assistance  Patient can return home with the following  A lot of help with walking and/or transfers;A lot of help with bathing/dressing/bathroom;Assistance with cooking/housework;Assistance with feeding;Assist for transportation;Help with stairs or ramp for  entrance;Direct supervision/assist for financial management;Direct supervision/assist for medications management    Equipment Recommendations Wheelchair (measurements PT);Wheelchair cushion (measurements PT);Hospital bed  Recommendations for Other Services       Functional Status Assessment Patient has had a recent decline in their functional status and demonstrates the ability to make significant improvements in function in a reasonable and predictable amount of time.     Precautions / Restrictions Precautions Precautions: Fall Restrictions Weight Bearing Restrictions: No      Mobility  Bed Mobility Overal bed mobility: Needs Assistance Bed Mobility: Rolling, Sidelying to Sit, Sit to Sidelying Rolling: Mod assist, Max assist Sidelying to sit: Max assist, +2 for physical assistance     Sit to sidelying: Max assist, +2 for safety/equipment General bed mobility comments: increased assist to roll to left side    Transfers Overall transfer level: Needs assistance Equipment used: None Transfers: Bed to chair/wheelchair/BSC            Lateral/Scoot Transfers: Min assist, +2 physical assistance (laterally scooting at edge of bed, does not transfer between surfaces.) General transfer comment: pt refuses attempts at standing due to reports of RLE pain    Ambulation/Gait                  Stairs            Wheelchair Mobility    Modified Rankin (Stroke Patients Only) Modified Rankin (Stroke Patients Only) Pre-Morbid Rankin Score: No symptoms Modified Rankin: Severe disability     Balance Overall balance assessment: Needs assistance Sitting-balance support: Single extremity supported, Feet supported Sitting balance-Leahy Scale: Poor Sitting balance - Comments: minG with unilateral UE support  Pertinent Vitals/Pain Pain Assessment Pain Assessment: 0-10 Pain Score: 10-Worst pain ever Pain Location: RLE  when in a dependent position, LLE when laying back on bed Pain Descriptors / Indicators: Aching, Pins and needles, Throbbing Pain Intervention(s): Monitored during session    Home Living Family/patient expects to be discharged to:: Skilled nursing facility                   Additional Comments: Pt is homeless and has been for ~30 years per his report    Prior Function Prior Level of Function : Independent/Modified Independent                     Hand Dominance   Dominant Hand: Right    Extremity/Trunk Assessment   Upper Extremity Assessment Upper Extremity Assessment: RUE deficits/detail RUE Deficits / Details: flaccid RUE Sensation: decreased light touch RUE Coordination: decreased fine motor;decreased gross motor    Lower Extremity Assessment Lower Extremity Assessment: RLE deficits/detail RLE Deficits / Details: 2-/5 DF/PF, 3/5 knee extension, 3-/5 hip flexion, 0/5 knee flexion noted RLE Sensation: decreased light touch    Cervical / Trunk Assessment Cervical / Trunk Assessment: Normal  Communication   Communication: No difficulties  Cognition Arousal/Alertness: Awake/alert Behavior During Therapy: Restless Overall Cognitive Status: Impaired/Different from baseline Area of Impairment: Following commands, Safety/judgement, Problem solving                       Following Commands: Follows one step commands inconsistently Safety/Judgement: Decreased awareness of safety, Decreased awareness of deficits   Problem Solving: Decreased initiation, Difficulty sequencing, Requires verbal cues, Requires tactile cues          General Comments General comments (skin integrity, edema, etc.): VSS on RA    Exercises     Assessment/Plan    PT Assessment Patient needs continued PT services  PT Problem List Decreased strength;Decreased activity tolerance;Decreased balance;Decreased mobility;Impaired sensation;Decreased cognition;Decreased safety  awareness       PT Treatment Interventions DME instruction;Gait training;Functional mobility training;Therapeutic activities;Therapeutic exercise;Stair training;Balance training;Neuromuscular re-education;Patient/family education;Wheelchair mobility training    PT Goals (Current goals can be found in the Care Plan section)  Acute Rehab PT Goals Patient Stated Goal: to regain strenght and go to assisted living PT Goal Formulation: With patient Time For Goal Achievement: 09/23/21 Potential to Achieve Goals: Fair    Frequency Min 3X/week     Co-evaluation PT/OT/SLP Co-Evaluation/Treatment: Yes Reason for Co-Treatment: For patient/therapist safety;Necessary to address cognition/behavior during functional activity PT goals addressed during session: Balance;Mobility/safety with mobility;Strengthening/ROM OT goals addressed during session: Strengthening/ROM       AM-PAC PT "6 Clicks" Mobility  Outcome Measure Help needed turning from your back to your side while in a flat bed without using bedrails?: A Lot Help needed moving from lying on your back to sitting on the side of a flat bed without using bedrails?: A Lot Help needed moving to and from a bed to a chair (including a wheelchair)?: Total Help needed standing up from a chair using your arms (e.g., wheelchair or bedside chair)?: Total Help needed to walk in hospital room?: Total Help needed climbing 3-5 steps with a railing? : Total 6 Click Score: 8    End of Session Equipment Utilized During Treatment: Gait belt Activity Tolerance: Patient limited by pain Patient left: in bed;with call bell/phone within reach;with bed alarm set Nurse Communication: Mobility status PT Visit Diagnosis: Other abnormalities of gait and mobility (R26.89);Muscle weakness (generalized) (M62.81);Hemiplegia and  hemiparesis Hemiplegia - Right/Left: Right Hemiplegia - caused by: Cerebral infarction    Time: 6886-4847 PT Time Calculation (min) (ACUTE  ONLY): 23 min   Charges:   PT Evaluation $PT Eval Moderate Complexity: 1 Mod          Zenaida Niece, PT, DPT Acute Rehabilitation Pager: [phone] Office [phone]   Zenaida Niece 09/09/2021, 3:28 PM

## 2021-09-10 DIAGNOSIS — F17210 Nicotine dependence, cigarettes, uncomplicated: Secondary | ICD-10-CM

## 2021-09-10 DIAGNOSIS — E7800 Pure hypercholesterolemia, unspecified: Secondary | ICD-10-CM

## 2021-09-10 DIAGNOSIS — F17200 Nicotine dependence, unspecified, uncomplicated: Secondary | ICD-10-CM

## 2021-09-10 LAB — PROTIME-INR
INR: 1.2 (ref 0.8–1.2)
Prothrombin Time: 15.2 seconds (ref 11.4–15.2)

## 2021-09-10 LAB — CBC
HCT: 33.3 % — ABNORMAL LOW (ref 39.0–52.0)
Hemoglobin: 10.7 g/dL — ABNORMAL LOW (ref 13.0–17.0)
MCH: 28.8 pg (ref 26.0–34.0)
MCHC: 32.1 g/dL (ref 30.0–36.0)
MCV: 89.8 fL (ref 80.0–100.0)
Platelets: 211 10*3/uL (ref 150–400)
RBC: 3.71 MIL/uL — ABNORMAL LOW (ref 4.22–5.81)
RDW: 14 % (ref 11.5–15.5)
WBC: 9 10*3/uL (ref 4.0–10.5)
nRBC: 0 % (ref 0.0–0.2)

## 2021-09-10 LAB — BASIC METABOLIC PANEL
Anion gap: 11 (ref 5–15)
BUN: 25 mg/dL — ABNORMAL HIGH (ref 6–20)
CO2: 23 mmol/L (ref 22–32)
Calcium: 8.3 mg/dL — ABNORMAL LOW (ref 8.9–10.3)
Chloride: 100 mmol/L (ref 98–111)
Creatinine, Ser: 1.81 mg/dL — ABNORMAL HIGH (ref 0.61–1.24)
GFR, Estimated: 45 mL/min — ABNORMAL LOW (ref >60)
Glucose, Bld: 113 mg/dL — ABNORMAL HIGH (ref 70–99)
Potassium: 4.2 mmol/L (ref 3.5–5.1)
Sodium: 134 mmol/L — ABNORMAL LOW (ref 135–145)

## 2021-09-10 LAB — BRAIN NATRIURETIC PEPTIDE: B Natriuretic Peptide: 1570.7 pg/mL — ABNORMAL HIGH (ref 0.0–100.0)

## 2021-09-10 MED ORDER — WARFARIN SODIUM 5 MG PO TABS
5.0000 mg | ORAL_TABLET | Freq: Once | ORAL | Status: AC
  Administered 2021-09-10: 5 mg via ORAL
  Filled 2021-09-10: qty 1

## 2021-09-10 MED ORDER — WARFARIN VIDEO: Status: DC | PRN

## 2021-09-10 MED ORDER — EMPAGLIFLOZIN 10 MG PO TABS
10.0000 mg | ORAL_TABLET | Freq: Every day | ORAL | Status: DC
  Administered 2021-09-10 – 2021-10-03 (×24): 10 mg via ORAL
  Filled 2021-09-10 (×24): qty 1

## 2021-09-10 MED ORDER — FUROSEMIDE 10 MG/ML IJ SOLN
40.0000 mg | Freq: Once | INTRAMUSCULAR | Status: AC
Start: 2021-09-10 — End: 2021-09-10
  Administered 2021-09-10: 40 mg via INTRAVENOUS
  Filled 2021-09-10: qty 4

## 2021-09-10 MED ORDER — DIPHENHYDRAMINE HCL 50 MG/ML IJ SOLN
25.0000 mg | Freq: Once | INTRAMUSCULAR | Status: AC
  Administered 2021-09-10: 25 mg via INTRAVENOUS
  Filled 2021-09-10: qty 1

## 2021-09-10 MED ORDER — ONDANSETRON HCL 4 MG/2ML IJ SOLN: 4.0000 mg | Freq: Once | INTRAMUSCULAR | Status: DC

## 2021-09-10 MED ORDER — COUMADIN BOOK
Freq: Once | Status: AC
  Filled 2021-09-10: qty 1

## 2021-09-10 MED ORDER — SALINE SPRAY 0.65 % NA SOLN
1.0000 | NASAL | Status: DC | PRN
  Filled 2021-09-10: qty 44

## 2021-09-10 NOTE — [place] FL2 (Signed)
[employer] MEDICAID FL2 LEVEL OF CARE SCREENING TOOL     IDENTIFICATION  Patient Name: Craig Schmidt Birthdate: 03-16-1971 Sex: male Admission Date (Current Location): 09/08/2021  [facility] and [place] Number:  Herbalist and Address:  [facility] 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   Tear of MCL (medial collateral ligament) of knee 05/20/2015   Protein-calorie malnutrition, severe 05/17/2015   Lactic acidosis 05/15/2015   Left knee pain 05/15/2015   Lung nodules 05/15/2015   Hypoglycemia 05/14/2015   Hypothermia 05/14/2015   Acute encephalopathy 05/14/2015   Cocaine abuse with intoxication ([place]) 05/14/2015   Alcohol intoxication in active alcoholic ([place]) 24/40/1027   Sepsis ([place]) 05/14/2015   Homeless 05/14/2015    Orientation RESPIRATION BLADDER Height & Weight     Self, Time, Situation, Place  Normal Incontinent Weight: 176 lb 5.9 oz (80 kg) Height:  5\' 5"  (165.1 cm)  BEHAVIORAL SYMPTOMS/MOOD NEUROLOGICAL BOWEL NUTRITION STATUS      Incontinent Diet (carb modified, heart healthy)  AMBULATORY STATUS COMMUNICATION OF NEEDS Skin   Extensive Assist Verbally Normal                       Personal Care Assistance Level of Assistance  Bathing, Feeding, Dressing Bathing Assistance: Maximum assistance Feeding assistance: Limited assistance Dressing Assistance: Maximum assistance     Functional Limitations Info              SPECIAL CARE FACTORS FREQUENCY  PT (By licensed PT), OT (By licensed OT)     PT Frequency: 5x weekly OT Frequency: 5x weekly            Contractures Contractures Info: Not present    Additional Factors Info  Code Status, Allergies Code Status Info: full Allergies Info: peanuts, shellfish           Current Medications (09/10/2021):  This is the current hospital active medication list Current Facility-Administered Medications  Medication Dose Route Frequency Provider Last Rate Last Admin   acetaminophen (TYLENOL) tablet 650 mg  650 mg Oral Q4H PRN Amie Portland, MD   650 mg at 09/10/21 0032   Or   acetaminophen (TYLENOL) 160 MG/5ML solution 650 mg  650 mg Per Tube Q4H PRN Amie Portland, MD       Or   acetaminophen (TYLENOL) suppository 650 mg  650 mg Rectal Q4H PRN Amie Portland, MD       aspirin EC tablet 325 mg  325 mg Oral Daily [name], MD   325 mg at 09/09/21 2043   atorvastatin (LIPITOR) tablet 40 mg  40 mg Oral Daily [name], MD   40 mg at 09/09/21 1041   carvedilol (COREG) tablet 12.5 mg  12.5 mg Oral BID WC Geralynn Rile, MD   12.5 mg at 09/10/21 0816   Chlorhexidine Gluconate Cloth 2 % PADS 6 each  6 each Topical Q0600 Amie Portland, MD   6 each at  09/09/21 2200   clevidipine (CLEVIPREX) infusion 0.5 mg/mL  0-21 mg/hr Intravenous Continuous Amie Portland, MD   Stopped at 09/09/21 1833   enoxaparin (LOVENOX) injection 40 mg  40 mg Subcutaneous Q24H [name], MD   40 mg at 09/09/21 2043   isosorbide-hydrALAZINE (BIDIL) 20-37.5 MG per tablet 1 tablet  1 tablet Oral TID Geralynn Rile, MD   1 tablet at 09/09/21 2125   melatonin tablet 3 mg  3 mg Oral QHS Donnetta Simpers, MD   3 mg at 09/09/21 2125   nicotine (NICODERM CQ - dosed in mg/24 hours) patch 21 mg  21 mg Transdermal Daily de Yolanda Manges, Cortney E, NP   21 mg at 09/09/21 1603   ondansetron (ZOFRAN) injection 4 mg  4 mg Intravenous Once Donnetta Simpers, MD       pantoprazole  (PROTONIX) EC tablet 40 mg  40 mg Oral Daily [name], MD   40 mg at 09/09/21 2125   senna-docusate (Senokot-S) tablet 1 tablet  1 tablet Oral QHS PRN Amie Portland, MD       sodium chloride (OCEAN) 0.65 % nasal spray 1 spray  1 spray Each Nare PRN de Yolanda Manges, Cortney E, NP       sodium chloride flush (NS) 0.9 % injection 3 mL  3 mL Intravenous Once Gareth Morgan, MD       Warfarin - Pharmacist Dosing Inpatient   Does not apply q1600 Kris Mouton, [facility]         Discharge Medications: Please see discharge summary for a list of discharge medications.  Relevant Imaging Results:  Relevant Lab Results:   Additional Information SSN: [id number], no COVID immunization records  [place], [place]

## 2021-09-10 NOTE — Progress Notes (Signed)
ANTICOAGULATION CONSULT NOTE  Pharmacy Consult for warfarin Indication: atrial fibrillation and stroke  Allergies  Allergen Reactions   Peanut-Containing Drug Products Itching   Shellfish Allergy Swelling    Patient Measurements: Height: 5\' 5"  (165.1 cm) Weight: 80 kg (176 lb 5.9 oz) IBW/kg (Calculated) : 61.5   Vital Signs: Temp: 98.2 F (36.8 C) (02/19 0800) Temp Source: Axillary (02/19 0800) BP: 133/107 (02/19 0800) Pulse Rate: 86 (02/19 0800)  Labs: Recent Labs    09/08/21 0915 09/08/21 0919 09/08/21 1031 09/08/21 1303 09/09/21 0411 09/10/21 0400  HGB 12.2* 14.3  --   --  11.0* 10.7*  HCT 38.9* 42.0  --   --  33.4* 33.3*  PLT 242  --   --   --  229 211  APTT 27  --   --   --   --   --   LABPROT 14.7  --   --   --   --  15.2  INR 1.2  --   --   --   --  1.2  CREATININE 1.95* 1.90*  --   --  1.64* 1.81*  TROPONINIHS  --   --  406* 312*  --   --      Estimated Creatinine Clearance: 48.1 mL/min (A) (by C-G formula based on SCr of 1.81 mg/dL (H)).   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 Pharmacy consulted to dose warfarin.  -s/p TNK on 2/17 at ~ 9:30am -INR 1.2 this morning and subtherapeutic as expected after first dose  -No bleeding noted, Hgb down to 10-11s, plt normal  Goal of Therapy:  INR 2-3 Monitor platelets by anticoagulation protocol: Yes   Plan:  -Repeat warfarin 5 mg po today -Daily INR -Monitor for s/sx of bleeding -Education prior to discharge -Book and video ordered  Thank you for involving pharmacy in this patient's care.  Renold Genta, PharmD, BCPS Clinical Pharmacist Clinical phone for 09/10/2021 until 3p is [phone] 09/10/2021 9:33 AM  **Pharmacist phone directory can be found on [email or web address] listed under [place]**

## 2021-09-10 NOTE — Progress Notes (Addendum)
STROKE TEAM PROGRESS NOTE   INTERVAL HISTORY Patient is seen in his room with no family at the bedside.  He has been hemodynamically stable overnight and no longer requires Cleviprex.  His neurological exam is stable, and he is ready to be transferred out of the ICU.  Vitals:   09/10/21 0800 09/10/21 0900 09/10/21 1000 09/10/21 1100  BP: (!) 133/107 (!) 149/103 119/86 (!) 125/94  Pulse: 86 86 82 85  Resp: 18 (!) 21 (!) 21 18  Temp: 98.2 F (36.8 C)     TempSrc: Axillary     SpO2: 100% 99% 97% 97%  Weight:      Height:       CBC:  Recent Labs  Lab 09/08/21 0915 09/08/21 0919 09/09/21 0411 09/10/21 0400  WBC 10.4  --  10.1 9.0  NEUTROABS 7.6  --   --   --   HGB 12.2*   < > 11.0* 10.7*  HCT 38.9*   < > 33.4* 33.3*  MCV 94.4  --  90.5 89.8  PLT 242  --  229 211   < > = values in this interval not displayed.    Basic Metabolic Panel:  Recent Labs  Lab 09/09/21 0411 09/10/21 0400  NA 135 134*  K 4.5 4.2  CL 105 100  CO2 21* 23  GLUCOSE 147* 113*  BUN 17 25*  CREATININE 1.64* 1.81*  CALCIUM 8.2* 8.3*    Lipid Panel:  Recent Labs  Lab 09/09/21 0411  CHOL 110  TRIG 60   61  HDL 21*  CHOLHDL 5.2  VLDL 12  LDLCALC 77    HgbA1c:  Recent Labs  Lab 09/09/21 0411  HGBA1C 5.6    Urine Drug Screen:  Recent Labs  Lab 09/08/21 1247  LABOPIA NONE DETECTED  COCAINSCRNUR POSITIVE*  LABBENZ NONE DETECTED  AMPHETMU NONE DETECTED  THCU NONE DETECTED  LABBARB NONE DETECTED     Alcohol Level  Recent Labs  Lab 09/08/21 0915  ETH <10     IMAGING past 24 hours No results found.  PHYSICAL EXAM General:  Alert, well-developed, well-nourished patient in no acute distress   NEURO:  Mental Status: AA&Ox3  Speech/Language: speech is without dysarthria or aphasia.    Cranial Nerves:  II: PERRL. Visual fields full.  III, IV, VI: EOMI. Eyelids elevate symmetrically.  V: right sided facial numbness present VII: right sided facial droop, improved from  yesterday VIII: hearing intact to voice. IX, X:  Phonation is normal.  XII: tongue is midline without fasciculations. Motor: 5/5 strength to LUE and LLE, 0/5 strength to RUE and 3/5 strength to RLE Tone: is normal and bulk is normal Sensation- right sided numbness present Coordination: FTN intact on left Gait- deferred  ASSESSMENT/PLAN Mr. SABAN HEINLEN is a 51 y.o. male with history of schwannoma, cocaine use and smoking presenting with sudden onset right sided weakness and slurred speech.  He was given TNK in the ED.  No LVO was seen on CTA head, so thrombectomy could not be performed.  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 Generalized volume loss and left cerebellopontine angle vestibular schwannoma ASPECTS 10.    CTA head & neck negative for LVO MRI  patchy multifocal acute ischemic infarcts  in left greater than right cerebral hemispheres and cerebellum, chronic microvascular ischemic disease, 1.4 cm left vestibular schwannoma 2D Echo EF 25-30%, global left ventricular hypokinesis, grade 2 diastolic dysfunction, rheumatic mitral valve with moderate mitral stenosis, moderate to severe tricuspid regurgitation, dilated right and left atria, no atrial level shunt LDL 77 HgbA1c 5.6 VTE prophylaxis - lovenox No antithrombotic prior to admission, now on warfarin daily and ASA 325. Once INR at goal, will d/c ASA.  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 Once INR at goal, will d/c ASA  Rheumatic mitral valve disease Rheumatic mitral stenosis seen on TTE Cardiology consulted, appreciate recommendations TEE Monday On coumadin  Tobacco abuse Current smoker Smoking cessation counseling provided Nicotine patch provided Pt is willing to quit  Cocaine abuse Cocaine cessation education provided Patient is willing to quit Okay with Coreg per cardiology  Other Stroke Risk Factors   Other [facility] day # [place] , MSN, AGACNP-BC [facility] See Amion for schedule and pager information 09/10/2021 11:11 AM   ATTENDING NOTE: I reviewed above note and agree with the assessment and plan. Pt was seen and examined.   Patient lying in bed, no family at the bedside.  No acute event overnight.  Still has right facial droop and right arm plegia, however right lower extremity muscle strength seems improved.  Speech much clearer than yesterday.  Cardiology on board, on Coreg and BiDil.  Also started Coumadin for cardiomyopathy and rheumatic mitral stenosis.  Bridged with aspirin, once INR at goal, aspirin will be discontinued.  Continue statin.  Patient off Cleviprex, plan for TEE tomorrow.  Smoking cessation and cocaine cessation education provided.  For detailed assessment and plan, please refer to above as I have made changes wherever appropriate.   [name], MD PhD Stroke Neurology 09/10/2021 2:14 PM  This patient is critically ill due to embolic strokes, cardiomyopathy with low EF, rheumatic mitral stenosis, cocaine abuse, smoker, hypertensive emergency and at significant risk of neurological worsening, death form recurrent strokes, hemorrhagic transformation, heart failure, seizure, hypertensive encephalopathy. This patient's care requires  constant monitoring of vital signs, hemodynamics, respiratory and cardiac monitoring, review of multiple databases, neurological assessment, discussion with family, other specialists and medical decision making of high complexity. I spent 30 minutes of neurocritical care time in the care of this patient.    To contact Stroke Continuity provider, please refer to [email or web address] After hours, contact General Neurology

## 2021-09-10 NOTE — Progress Notes (Signed)
°   09/10/21 1759  Vitals  Temp 98.1 F (36.7 C)  Temp Source Oral  BP (!) 137/92  MAP (mmHg) 105  BP Location Left Arm  BP Method Automatic  Patient Position (if appropriate) Lying  Pulse Rate 95  ECG Heart Rate 96  Resp 19  Level of Consciousness  Level of Consciousness Alert  MEWS COLOR  MEWS Score Color Green  Oxygen Therapy  SpO2 100 %  O2 Device Room Air  MEWS Score  MEWS Temp 0  MEWS Systolic 0  MEWS Pulse 0  MEWS RR 0  MEWS LOC 0  MEWS Score 0   Transfer from 4N ICU stable,oriented x4 Right arm flaccid, Weakness to RLE. Denies pain. Call bell within reach.

## 2021-09-10 NOTE — Evaluation (Signed)
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   Tear of MCL (medial collateral ligament) of knee 05/20/2015   Protein-calorie malnutrition, severe 05/17/2015   Lactic acidosis 05/15/2015   Left knee pain 05/15/2015   Lung nodules 05/15/2015   Hypoglycemia 05/14/2015   Hypothermia 05/14/2015   Acute encephalopathy 05/14/2015   Cocaine abuse with intoxication ([place]) 05/14/2015   Alcohol intoxication in active alcoholic ([place]) 67/20/9470   Sepsis ([place]) 05/14/2015   Homeless 05/14/2015   Past Medical History:  Past Medical History:  Diagnosis Date   Asthma    Brain tumor [facility])    Past Surgical History: History reviewed. No pertinent surgical history. HPI:  Craig Schmidt is a 51 y.o. male presented to the emergency room for sudden onset of right-sided weakness. TNKase given. 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 PHMx:schwannoma, cocaine abuse which he says he has not used over the past few weeks; schizophrenia, homelessness.   Assessment / Plan / Recommendation Clinical Impression  Pt presents with a mild aphasia that is marked by fluent output, but subtle deficits in word-retrieval that lead to slower, more deliberate speech during construction of novel sentences.  Basic confrontation, responsive, and divergent naming were [facility].  He followed one and two step commends but had difficulty with three steps. Most notable was significant difficulty with left/right discrimination  when combined with body part (e.g., left  shoulder, right ear) - He scored a 0/7 for this subtest. Basic reading was [facility].  Speech was no longer dysarthric. Recommend ongoing SLP f/u while admitted to address the above deficits; pt will benefit from post-acute rehab.    SLP Assessment  SLP Recommendation/Assessment: Patient needs continued Speech [place] Path[facility] Services SLP Visit Diagnosis: Aphasia (R47.01)    Recommendations for follow up therapy are one component of a multi-disciplinary discharge planning process, led by the attending physician.  Recommendations may be updated based on patient status, additional functional criteria and insurance authorization.    Follow Up Recommendations  Skilled nursing-short term rehab (<3 hours/day)    Assistance Recommended at Discharge  Intermittent Supervision/Assistance  Functional Status Assessment Patient has had a recent decline in their functional status and demonstrates the ability to make significant improvements in function in a reasonable and predictable amount of time.  Frequency and Duration min 2x/week  1 week      SLP Evaluation Cognition  Overall Cognitive Status: Impaired/Different from baseline Orientation Level: Oriented X4 Awareness: Impaired Awareness Impairment: Other (comment) (greater awareness of physical than language deficits)       Comprehension  Auditory Comprehension Overall Auditory Comprehension: Impaired Yes/No Questions: Within Functional Limits Commands: Impaired Multistep Basic Commands: 50-74% accurate Conversation: Simple Visual Recognition/Discrimination Discrimination: Exceptions to [facility] L/R Discrimination: Other (comment) (0/5 for l/r discrim) Reading Comprehension Reading Status: Within funtional limits    Expression Expression Primary Mode of Expression: Verbal Verbal Expression Overall Verbal Expression: Impaired Initiation: No impairment Automatic Speech: Month of year (omitted three months from list) Level of  Generative/Spontaneous Verbalization: Conversation Repetition: No impairment Naming: Impairment Responsive: 76-100% accurate Confrontation: Within functional limits Divergent: 75-100% accurate Pragmatics: No impairment Written Expression Dominant Hand:  Right   Oral / Motor  Oral Motor/Sensory Function Overall Oral Motor/Sensory Function: Mild impairment Facial Symmetry: Abnormal symmetry right;Suspected CN VII (facial) dysfunction Motor Speech Overall Motor Speech: Appears within functional limits for tasks assessed            Juan Quam Laurice 09/10/2021, 11:00 AM Estill Bamberg L. Tivis Ringer, [place] Office number [phone] Pager [phone]

## 2021-09-10 NOTE — Progress Notes (Signed)
Cardiology Progress Note  Patient ID: ROBERTA KELLY MRN: [id number] DOB: [birth year] Date of Encounter: 09/10/2021  Primary Cardiologist: None  Subjective   Chief Complaint: None.   HPI: Good diuresis.  Blood pressure much improved.  Started on Coumadin.  Denies chest pain or trouble breathing.  Still with significant JVD.  Diuresis 1 more day.  Kidney function somewhat stable.  Still with right upper extremity and lower extremity weakness.  ROS:  All other ROS reviewed and negative. Pertinent positives noted in the HPI.     Inpatient Medications  Scheduled Meds:  aspirin EC  325 mg Oral Daily   atorvastatin  40 mg Oral Daily   carvedilol  12.5 mg Oral BID WC   Chlorhexidine Gluconate Cloth  6 each Topical Q0600   coumadin book   Does not apply Once   empagliflozin  10 mg Oral Daily   enoxaparin (LOVENOX) injection  40 mg Subcutaneous Q24H   furosemide  40 mg Intravenous Once   isosorbide-hydrALAZINE  1 tablet Oral TID   melatonin  3 mg Oral QHS   nicotine  21 mg Transdermal Daily   ondansetron (ZOFRAN) IV  4 mg Intravenous Once   pantoprazole  40 mg Oral Daily   sodium chloride flush  3 mL Intravenous Once   warfarin  5 mg Oral ONCE-1600   Warfarin - Pharmacist Dosing Inpatient   Does not apply q1600   Continuous Infusions:  clevidipine Stopped (09/09/21 1833)   PRN Meds: acetaminophen **OR** acetaminophen (TYLENOL) oral liquid 160 mg/5 mL **OR** acetaminophen, senna-docusate, sodium chloride, warfarin   Vital Signs   Vitals:   09/10/21 0615 09/10/21 0630 09/10/21 0700 09/10/21 0800  BP: 121/85 127/89 (!) 130/93 (!) 133/107  Pulse: 83 83 82 86  Resp: 16 16 17 18   Temp:    98.2 F (36.8 C)  TempSrc:    Axillary  SpO2: 97% 99% 97% 100%  Weight:      Height:        Intake/Output Summary (Last 24 hours) at 09/10/2021 1021 Last data filed at 09/10/2021 0800 Gross per 24 hour  Intake 402.62 ml  Output 2625 ml  Net -2222.38 ml   Last 3 Weights 09/08/2021  09/08/2021 10/03/2018  Weight (lbs) 176 lb 5.9 oz 176 lb 5.9 oz 160 lb  Weight (kg) 80 kg 80 kg 72.576 kg      Telemetry  Overnight telemetry shows sinus rhythm in the 80s, which I personally reviewed.   Physical Exam   Vitals:   09/10/21 0615 09/10/21 0630 09/10/21 0700 09/10/21 0800  BP: 121/85 127/89 (!) 130/93 (!) 133/107  Pulse: 83 83 82 86  Resp: 16 16 17 18   Temp:    98.2 F (36.8 C)  TempSrc:    Axillary  SpO2: 97% 99% 97% 100%  Weight:      Height:        Intake/Output Summary (Last 24 hours) at 09/10/2021 1021 Last data filed at 09/10/2021 0800 Gross per 24 hour  Intake 402.62 ml  Output 2625 ml  Net -2222.38 ml    Last 3 Weights 09/08/2021 09/08/2021 10/03/2018  Weight (lbs) 176 lb 5.9 oz 176 lb 5.9 oz 160 lb  Weight (kg) 80 kg 80 kg 72.576 kg    Body mass index is 29.35 kg/m.   General: Well nourished, well developed, in no acute distress Head: Atraumatic, normal size  Eyes: PEERLA, EOMI  Neck: Supple, JVD 7 to 8 cm of water Endocrine: No thryomegaly Cardiac: Normal  S1, S2; RRR; diastolic rumble noted Lungs: Clear to auscultation bilaterally, no wheezing, rhonchi or rales  Abd: Soft, nontender, no hepatomegaly  Ext: No edema, pulses 2+ Musculoskeletal: No deformities Skin: Warm and dry, no rashes   Neuro: Alert and oriented to person, place, time, and situation, CNII-XII grossly intact, weakness noted in the right upper and right lower extremities  Labs  High Sensitivity Troponin:   Recent Labs  Lab 09/08/21 1031 09/08/21 1303  TROPONINIHS 406* 312*     Cardiac EnzymesNo results for input(s): TROPONINI in the last 168 hours. No results for input(s): TROPIPOC in the last 168 hours.  Chemistry Recent Labs  Lab 09/08/21 0915 09/08/21 0919 09/09/21 0411 09/10/21 0400  NA 139 141 135 134*  K 3.9 4.1 4.5 4.2  CL 107 106 105 100  CO2 22  --  21* 23  GLUCOSE 75 72 147* 113*  BUN 19 27* 17 25*  CREATININE 1.95* 1.90* 1.64* 1.81*  CALCIUM 8.8*  --   8.2* 8.3*  PROT 6.3*  --   --   --   ALBUMIN 2.9*  --   --   --   AST 59*  --   --   --   ALT 44  --   --   --   ALKPHOS 132*  --   --   --   BILITOT 0.7  --   --   --   GFRNONAA 41*  --  51* 45*  ANIONGAP 10  --  9 11    Hematology Recent Labs  Lab 09/08/21 0915 09/08/21 0919 09/09/21 0411 09/10/21 0400  WBC 10.4  --  10.1 9.0  RBC 4.12*  --  3.69* 3.71*  HGB 12.2* 14.3 11.0* 10.7*  HCT 38.9* 42.0 33.4* 33.3*  MCV 94.4  --  90.5 89.8  MCH 29.6  --  29.8 28.8  MCHC 31.4  --  32.9 32.1  RDW 14.1  --  14.1 14.0  PLT 242  --  229 211   BNP Recent Labs  Lab 09/10/21 0400  BNP 1,570.7*    DDimer No results for input(s): DDIMER in the last 168 hours.   Radiology  CT HEAD WO CONTRAST (5MM)  Result Date: 09/09/2021 CLINICAL DATA:  51 year old male code stroke presentation yesterday. Acute bilateral cerebral and cerebellar infarcts on MRI. EXAM: CT HEAD WITHOUT CONTRAST TECHNIQUE: Contiguous axial images were obtained from the base of the skull through the vertex without intravenous contrast. RADIATION DOSE REDUCTION: This exam was performed according to the departmental dose-optimization program which includes automated exposure control, adjustment of the mA and/or kV according to patient size and/or use of iterative reconstruction technique. COMPARISON:  Brain MRI, head CT and CTA head and neck yesterday. FINDINGS: Brain: Increased conspicuity of cytotoxic edema corresponding to abnormal DWI yesterday, most pronounced in the posterior left MCA territory and not definitely extended since the MRI (series 3, images 18 through 24). No associated hemorrhage or mass effect. No ventriculomegaly. Deep gray matter nuclei and brainstem appear a stable. 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 Vascular: No suspicious intracranial vascular hyperdensity. Skull: No acute osseous abnormality identified. Sinuses/Orbits: Visualized paranasal sinuses and  mastoids are stable and well aerated. Other: Disconjugate gaze. No other acute orbit or scalp soft tissue finding. IMPRESSION: 1. Expected CT appearance of the acute cortical infarcts on MRI yesterday, most confluent in the posterior Left MCA territory. No hemorrhagic transformation or mass effect. 2. Stable left cerebellopontine angle  vestibular schwannoma. Electronically Signed   By: Genevie Ann M.D.   On: 09/09/2021 10:29   MR BRAIN WO CONTRAST  Result Date: 09/08/2021 CLINICAL DATA:  Initial evaluation for neuro deficit, stroke suspected. EXAM: MRI HEAD WITHOUT CONTRAST TECHNIQUE: Multiplanar, multiecho pulse sequences of the brain and surrounding structures were obtained without intravenous contrast. COMPARISON:  Prior CT and CTA from earlier the same day. Comparison also made with previous MRI from 05/17/2015. FINDINGS: Brain: Cerebral volume within normal limits for age. Mild scattered patchy subcentimeter T2/FLAIR hyperintensity noted involving the supratentorial cerebral white matter, nonspecific, but most like related chronic microvascular ischemic disease, mild in nature. Small remote cortical infarct with associated hemosiderin staining noted at the right occipital lobe. Additional tiny remote left cerebellar infarct noted. Patchy multifocal areas of restricted diffusion seen involving the left greater than right cerebral hemispheres, most pronounced at the posterior left frontal parietal region (series 5, image 86). Diffusion signal abnormality primarily involves the cortical gray matter. Few patchy foci in noted involving the bilateral cerebellar hemispheres as well. No associated hemorrhage or significant regional mass effect. A central thromboembolic etiology is suspected given the various vascular distributions involved. Otherwise, gray-white matter differentiation maintained. No other areas of chronic cortical infarction. No other evidence for acute or chronic intracranial hemorrhage. Known left  vestibular schwannoma measures up to approximately 1.4 cm, similar to previous. No associated mass effect. Mega cisterna magna again noted. No other mass lesion, midline shift or mass effect. No hydrocephalus or extra-axial fluid collection. Cavum et septum pellucidum again noted. Pituitary gland suprasellar region within normal limits. Midline structures intact. Vascular: Major intracranial vascular flow voids are maintained. Skull and upper cervical spine: Craniocervical junction within normal limits. Bone marrow signal intensity within normal limits. No scalp soft tissue abnormality. Sinuses/Orbits: Globes and orbital soft tissues within normal limits. Paranasal sinuses are largely clear. No mastoid effusion. Other: None. IMPRESSION: 1. Patchy multifocal acute ischemic nonhemorrhagic infarcts involving the left greater than right cerebral hemispheres and cerebellum. A central thromboembolic etiology is suspected given the various vascular distributions involved. 2. Underlying mild chronic microvascular ischemic disease with small remote right occipital and left cerebellar infarcts. 3. 1.4 cm left vestibular schwannoma, similar to previous. Electronically Signed   By: Jeannine Boga M.D.   On: 09/08/2021 23:46   EEG adult  Result Date: 09/08/2021 Greta Doom, MD     09/08/2021 12:01 PM History: 51 yo M being evaluated for right sided weakness Sedation: none Technique: This EEG was acquired with electrodes placed according to the International 10-20 electrode system (including Fp1, Fp2, F3, F4, C3, C4, P3, P4, O1, O2, T3, T4, T5, T6, A1, A2, Fz, Cz, Pz). The following electrodes were missing or displaced: none. Background: The background consists of intermixed alpha and beta activities. There is a well defined posterior dominant rhythm of 9 Hz that attenuates with eye opening. Sleep is recorded with normal appearing structures. Photic stimulation: Physiologic driving is not perofmred EEG  Abnormalities: none Clinical Interpretation: This normal EEG is recorded in the waking and drowsy state. There was no seizure or seizure predisposition recorded on this study. Please note that lack of epileptiform activity on EEG does not preclude the possibility of epilepsy. Roland Rack, MD [facility] [phone] If 7pm- 7am, please page neurology on call as listed in [place].   ECHOCARDIOGRAM COMPLETE  Result Date: 09/08/2021    ECHOCARDIOGRAM REPORT   Patient Name:   TOLUWANI YADAV Date of Exam: 09/08/2021 Medical Rec #:  [id number]  Height:       65.0 in Accession #:    [id number]         Weight:       176.4 lb Date of Birth:  1970/11/23           BSA:          1.875 m Patient Age:    68 years           BP:           127/93 mmHg Patient Gender: M                  HR:           99 bpm. Exam Location:  Inpatient Procedure: 2D Echo Indications:    Stroke  History:        Patient has no prior history of Echocardiogram examinations.  Sonographer:    Arlyss Gandy Referring Phys: [id number] ASHISH ARORA IMPRESSIONS  1. Left ventricular ejection fraction, by estimation, is 25 to 30%. The left ventricle has severely decreased function. The left ventricle demonstrates global hypokinesis. Left ventricular diastolic parameters are consistent with Grade II diastolic dysfunction (pseudonormalization).  2. Right ventricular systolic function is mildly reduced. The right ventricular size is normal. There is moderately elevated pulmonary artery systolic pressure. The estimated right ventricular systolic pressure is 56.2 mmHg.  3. Left atrial size was mild to moderately dilated.  4. Right atrial size was mildly dilated.  5. The mitral valve is rheumatic. Mild to moderate mitral valve regurgitation. Moderate mitral stenosis. The mean mitral valve gradient is 6.0 mmHg with MVA 1.3 cm^2 by VTI.  6. Tricuspid valve regurgitation is moderate to severe.  7. The aortic valve is tricuspid. Aortic valve  regurgitation is not visualized. Aortic valve sclerosis/calcification is present, without any evidence of aortic stenosis.  8. The inferior vena cava is dilated in size with <50% respiratory variability, suggesting right atrial pressure of 15 mmHg. FINDINGS  Left Ventricle: Left ventricular ejection fraction, by estimation, is 25 to 30%. The left ventricle has severely decreased function. The left ventricle demonstrates global hypokinesis. The left ventricular internal cavity size was normal in size. There is no left ventricular hypertrophy. Left ventricular diastolic parameters are consistent with Grade II diastolic dysfunction (pseudonormalization). Right Ventricle: The right ventricular size is normal. No increase in right ventricular wall thickness. Right ventricular systolic function is mildly reduced. There is moderately elevated pulmonary artery systolic pressure. The tricuspid regurgitant velocity is 2.95 m/s, and with an assumed right atrial pressure of 15 mmHg, the estimated right ventricular systolic pressure is 13.0 mmHg. Left Atrium: Left atrial size was mild to moderately dilated. Right Atrium: Right atrial size was mildly dilated. Pericardium: Trivial pericardial effusion is present. Mitral Valve: The mitral valve is rheumatic. There is mild calcification of the mitral valve leaflet(s). Mild mitral annular calcification. Mild to moderate mitral valve regurgitation. Moderate mitral valve stenosis. MV peak gradient, 15.7 mmHg. The mean  mitral valve gradient is 6.0 mmHg. Tricuspid Valve: The tricuspid valve is normal in structure. Tricuspid valve regurgitation is moderate to severe. Aortic Valve: The aortic valve is tricuspid. Aortic valve regurgitation is not visualized. Aortic valve sclerosis/calcification is present, without any evidence of aortic stenosis. Aortic valve mean gradient measures 3.0 mmHg. Aortic valve peak gradient measures 4.6 mmHg. Aortic valve area, by VTI measures 2.48 cm. Pulmonic  Valve: The pulmonic valve was normal in structure. Pulmonic valve regurgitation is trivial. Aorta: The aortic root is normal in size  and structure. Venous: The inferior vena cava is dilated in size with less than 50% respiratory variability, suggesting right atrial pressure of 15 mmHg. IAS/Shunts: No atrial level shunt detected by color flow Doppler.  LEFT VENTRICLE PLAX 2D LVIDd:         4.80 cm   Diastology LVIDs:         4.30 cm   LV e' medial:    4.22 cm/s LV PW:         1.10 cm   LV E/e' medial:  39.6 LV IVS:        1.10 cm   LV e' lateral:   7.38 cm/s LVOT diam:     2.06 cm   LV E/e' lateral: 22.6 LV SV:         45 LV SV Index:   24 LVOT Area:     3.33 cm  RIGHT VENTRICLE            IVC RV Basal diam:  4.70 cm    IVC diam: 2.20 cm RV Mid diam:    3.50 cm RV S prime:     8.76 cm/s TAPSE (M-mode): 1.6 cm LEFT ATRIUM             Index        RIGHT ATRIUM           Index LA diam:        3.90 cm 2.08 cm/m   RA Area:     22.80 cm LA Vol (A2C):   74.0 ml 39.46 ml/m  RA Volume:   72.90 ml  38.88 ml/m LA Vol (A4C):   66.8 ml 35.62 ml/m LA Biplane Vol: 73.8 ml 39.36 ml/m  AORTIC VALVE AV Area (Vmax):    2.62 cm AV Area (Vmean):   2.49 cm AV Area (VTI):     2.48 cm AV Vmax:           107.00 cm/s AV Vmean:          76.500 cm/s AV VTI:            0.180 m AV Peak Grad:      4.6 mmHg AV Mean Grad:      3.0 mmHg LVOT Vmax:         84.20 cm/s LVOT Vmean:        57.100 cm/s LVOT VTI:          0.134 m LVOT/AV VTI ratio: 0.74  AORTA Ao Root diam: 2.70 cm Ao Asc diam:  2.40 cm MITRAL VALVE                TRICUSPID VALVE MV Area (PHT): 3.99 cm     TR Peak grad:   34.8 mmHg MV Peak grad:  15.7 mmHg    TR Vmax:        295.00 cm/s MV Mean grad:  6.0 mmHg MV Vmax:       1.98 m/s     SHUNTS MV Vmean:      113.0 cm/s   Systemic VTI:  0.13 m MV Decel Time: 190 msec     Systemic Diam: 2.06 cm MV E velocity: 167.00 cm/s MV A velocity: 108.00 cm/s MV E/A ratio:  1.55 Dalton McleanMD Electronically signed by Franki Monte  Signature Date/Time: 09/08/2021/3:52:33 PM    Final     Cardiac Studies  TTE 09/08/2021  1. Left ventricular ejection fraction, by estimation, is 25 to 30%. The  left ventricle has severely decreased  function. The left ventricle  demonstrates global hypokinesis. Left ventricular diastolic parameters are  consistent with Grade II diastolic  dysfunction (pseudonormalization).   2. Right ventricular systolic function is mildly reduced. The right  ventricular size is normal. There is moderately elevated pulmonary artery  systolic pressure. The estimated right ventricular systolic pressure is  71.2 mmHg.   3. Left atrial size was mild to moderately dilated.   4. Right atrial size was mildly dilated.   5. The mitral valve is rheumatic. Mild to moderate mitral valve  regurgitation. Moderate mitral stenosis. The mean mitral valve gradient is  6.0 mmHg with MVA 1.3 cm^2 by VTI.   6. Tricuspid valve regurgitation is moderate to severe.   7. The aortic valve is tricuspid. Aortic valve regurgitation is not  visualized. Aortic valve sclerosis/calcification is present, without any  evidence of aortic stenosis.   8. The inferior vena cava is dilated in size with <50% respiratory  variability, suggesting right atrial pressure of 15 mmHg.   Patient Profile  DUTCH ING is a 51 y.o. male with hypertension and cocaine abuse who was admitted 09/08/2021 with acute stroke.  He is status post IV TNKase.  Cardiology consulted for new onset systolic heart failure as well as mitral valve stenosis.  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  Pattern on imaging consistent with cardioembolic phenomenon. -Strongly suspect this came from the left atrium in the setting of rheumatic mitral valve disease.  Coumadin is recommended which has been started. -We will benefit from transesophageal echocardiogram to further characterize his mitral  valve disease.  I suspect this is severe.  Gradients could be lower in the setting of an advanced cardiomyopathy.  His LV is dilated which is likely related to substance abuse as well as uncontrolled hypertension.  Regardless he needs anticoagulation and further evaluation of his mitral valve. -N.p.o. at midnight for transesophageal echocardiogram.  May get pushed to Tuesday.  We will determine scheduled tomorrow morning.  #New onset systolic heart failure, EF 25-30% -Dilated cardiomyopathy in the setting of uncontrolled hypertension as well as cocaine abuse. -Mitral stenosis does not explain LV dilation.  Mitral stenosis is associated with small and underfilled LV.  He could have 2 processes here.  He could have a dilated cardiomyopathy due to uncontrolled hypertension as well as cocaine abuse.  Could also have mitral valve disease as well.  See discussion above. -Still with JVD.  I will give an additional dose of Lasix 40 mg IV as a one-time dose today.  Suspect he will be euvolemic tomorrow. -Troponins were elevated but reports no chest pain.  This could all be demand.  Given stroke with hemiparesis of the right upper and right lower extremities he is likely not a great candidate for invasive angiography.  I think medical management is the best option for now.  Could consider coronary CTA if kidney function improves. -Overall would really like to see how much he recovers before recommending invasive angiography.  Transesophageal echo likely okay but will hold on invasive angiography given kidney function as well as substance abuse. -Continue Coreg 12.5 mg twice daily.  BiDil 20-37.5 mg 3 times daily was added yesterday.  Given his kidney function we will hold on ACE/ARB/Arni/MRA.  We will see where his kidney function ends up before we recommend these therapies. -Kidney function is okay for Jardiance 10 mg daily.  Have added this. -He will need to refrain from drugs  moving forward.  #Elevated  troponin -Suspect demand in setting of hypertension and systolic heart failure.  Not an acute coronary syndrome.  On statin. -On aspirin 325 mg daily.  Transition to 81 mg daily once INR is therapeutic.  #Cocaine use -Cessation advised.  For questions or updates, please contact [place] Please consult [email or web address] for contact info under     Signed, [place] Bells T. Audie Box, MD, [place]  09/10/2021 10:21 AM

## 2021-09-10 NOTE — Plan of Care (Signed)
°  Problem: Education: Goal: Knowledge of disease or condition will improve Outcome: Progressing Goal: Knowledge of secondary prevention will improve (SELECT ALL) Outcome: Progressing Goal: Knowledge of patient specific risk factors will improve (INDIVIDUALIZE FOR PATIENT) Outcome: Progressing   Problem: Coping: Goal: Will verbalize positive feelings about self Outcome: Progressing   Problem: Health Behavior/Discharge Planning: Goal: Ability to manage health-related needs will improve Outcome: Progressing   Problem: Self-Care: Goal: Ability to participate in self-care as condition permits will improve Outcome: Progressing   Problem: Ischemic Stroke/TIA Tissue Perfusion: Goal: Complications of ischemic stroke/TIA will be minimized Outcome: Progressing

## 2021-09-11 ENCOUNTER — Encounter (HOSPITAL_COMMUNITY): Payer: Self-pay | Admitting: Student in an Organized Health Care Education/Training Program

## 2021-09-11 ENCOUNTER — Inpatient Hospital Stay (HOSPITAL_COMMUNITY): Payer: Medicaid Other

## 2021-09-11 ENCOUNTER — Encounter (HOSPITAL_COMMUNITY)
Admission: EM | Disposition: A | Discharge status: Home / Self Care | Payer: Self-pay | Source: Home / Self Care | Attending: Neurology

## 2021-09-11 ENCOUNTER — Inpatient Hospital Stay (HOSPITAL_COMMUNITY): Payer: Medicaid Other | Admitting: Certified Registered"

## 2021-09-11 ENCOUNTER — Other Ambulatory Visit (HOSPITAL_COMMUNITY): Payer: Self-pay

## 2021-09-11 DIAGNOSIS — I340 Nonrheumatic mitral (valve) insufficiency: Secondary | ICD-10-CM

## 2021-09-11 DIAGNOSIS — N289 Disorder of kidney and ureter, unspecified: Secondary | ICD-10-CM

## 2021-09-11 DIAGNOSIS — D649 Anemia, unspecified: Secondary | ICD-10-CM

## 2021-09-11 DIAGNOSIS — I639 Cerebral infarction, unspecified: Secondary | ICD-10-CM

## 2021-09-11 DIAGNOSIS — Z8673 Personal history of transient ischemic attack (TIA), and cerebral infarction without residual deficits: Secondary | ICD-10-CM

## 2021-09-11 DIAGNOSIS — I5021 Acute systolic (congestive) heart failure: Secondary | ICD-10-CM

## 2021-09-11 DIAGNOSIS — I052 Rheumatic mitral stenosis with insufficiency: Secondary | ICD-10-CM

## 2021-09-11 HISTORY — PX: TEE WITHOUT CARDIOVERSION: SHX5443

## 2021-09-11 HISTORY — PX: BUBBLE STUDY: SHX6837

## 2021-09-11 LAB — BASIC METABOLIC PANEL
Anion gap: 10 (ref 5–15)
BUN: 30 mg/dL — ABNORMAL HIGH (ref 6–20)
CO2: 24 mmol/L (ref 22–32)
Calcium: 8.4 mg/dL — ABNORMAL LOW (ref 8.9–10.3)
Chloride: 100 mmol/L (ref 98–111)
Creatinine, Ser: 1.94 mg/dL — ABNORMAL HIGH (ref 0.61–1.24)
GFR, Estimated: 42 mL/min — ABNORMAL LOW (ref >60)
Glucose, Bld: 93 mg/dL (ref 70–99)
Potassium: 4.3 mmol/L (ref 3.5–5.1)
Sodium: 134 mmol/L — ABNORMAL LOW (ref 135–145)

## 2021-09-11 LAB — ECHO TEE
MV M vel: 5.31 m/s
MV Peak grad: 112.8 mmHg
Radius: 0.8 cm

## 2021-09-11 LAB — CBC
HCT: 33.5 % — ABNORMAL LOW (ref 39.0–52.0)
Hemoglobin: 10.6 g/dL — ABNORMAL LOW (ref 13.0–17.0)
MCH: 29 pg (ref 26.0–34.0)
MCHC: 31.6 g/dL (ref 30.0–36.0)
MCV: 91.5 fL (ref 80.0–100.0)
Platelets: 223 10*3/uL (ref 150–400)
RBC: 3.66 MIL/uL — ABNORMAL LOW (ref 4.22–5.81)
RDW: 14.1 % (ref 11.5–15.5)
WBC: 8.6 10*3/uL (ref 4.0–10.5)
nRBC: 0 % (ref 0.0–0.2)

## 2021-09-11 LAB — PROTIME-INR
INR: 1.1 (ref 0.8–1.2)
Prothrombin Time: 14.6 seconds (ref 11.4–15.2)

## 2021-09-11 SURGERY — ECHOCARDIOGRAM, TRANSESOPHAGEAL
Anesthesia: Monitor Anesthesia Care

## 2021-09-11 MED ORDER — PROPOFOL 500 MG/50ML IV EMUL
INTRAVENOUS | Status: DC | PRN
  Administered 2021-09-11: 100 ug/kg/min via INTRAVENOUS

## 2021-09-11 MED ORDER — PROPOFOL 10 MG/ML IV BOLUS
INTRAVENOUS | Status: DC | PRN
Start: 2021-09-11 — End: 2021-09-11
  Administered 2021-09-11: 30 mg via INTRAVENOUS

## 2021-09-11 MED ORDER — WARFARIN SODIUM 7.5 MG PO TABS
7.5000 mg | ORAL_TABLET | Freq: Once | ORAL | Status: AC
  Administered 2021-09-11: 7.5 mg via ORAL
  Filled 2021-09-11: qty 1

## 2021-09-11 MED ORDER — MORPHINE SULFATE (PF) 2 MG/ML IV SOLN
2.0000 mg | Freq: Once | INTRAVENOUS | Status: AC
  Administered 2021-09-11: 2 mg via INTRAVENOUS
  Filled 2021-09-11: qty 1

## 2021-09-11 MED ORDER — GABAPENTIN 600 MG PO TABS
300.0000 mg | ORAL_TABLET | Freq: Every day | ORAL | Status: DC
Start: 2021-09-11 — End: 2021-09-16
  Administered 2021-09-11 – 2021-09-15 (×5): 300 mg via ORAL
  Filled 2021-09-11 (×5): qty 1

## 2021-09-11 MED ORDER — TRAMADOL HCL 50 MG PO TABS
50.0000 mg | ORAL_TABLET | Freq: Four times a day (QID) | ORAL | Status: DC | PRN
  Administered 2021-09-11 – 2021-10-03 (×13): 50 mg via ORAL
  Filled 2021-09-11 (×15): qty 1

## 2021-09-11 MED ORDER — SODIUM CHLORIDE 0.9 % IV SOLN: INTRAVENOUS | Status: DC

## 2021-09-11 NOTE — Progress Notes (Signed)
ANTICOAGULATION CONSULT NOTE - Follow Up Consult  Pharmacy Consult for Warfarin Indication:  s/p CVA , afib  Allergies  Allergen Reactions   Peanut-Containing Drug Products Itching   Shellfish Allergy Swelling    Patient Measurements: Height: 5\' 5"  (165.1 cm) Weight: 80 kg (176 lb 5.9 oz) IBW/kg (Calculated) : 61.5  Vital Signs: Temp: 97.8 F (36.6 C) (02/20 0804) Temp Source: Oral (02/20 0804) BP: 140/104 (02/20 0804) Pulse Rate: 86 (02/20 0804)  Labs: Recent Labs    09/08/21 0915 09/08/21 0919 09/08/21 1031 09/08/21 1303 09/09/21 0411 09/10/21 0400 09/11/21 0259  HGB 12.2*   < >  --   --  11.0* 10.7* 10.6*  HCT 38.9*   < >  --   --  33.4* 33.3* 33.5*  PLT 242  --   --   --  229 211 223  APTT 27  --   --   --   --   --   --   LABPROT 14.7  --   --   --   --  15.2 14.6  INR 1.2  --   --   --   --  1.2 1.1  CREATININE 1.95*   < >  --   --  1.64* 1.81* 1.94*  TROPONINIHS  --   --  406* 312*  --   --   --    < > = values in this interval not displayed.    Estimated Creatinine Clearance: 44.9 mL/min (A) (by C-G formula based on SCr of 1.94 mg/dL (H)).   Assessment: Anticoag: new warfarin for CVA/afib w/ moderate/severe rheumatic mitral stenosis (warfarin preferred); + enox 40 Hgb 14 > 10-11s, plt WNL; INR 1.1 no real change  Goal of Therapy:  INR 2-3 Monitor platelets by anticoagulation protocol: Yes   Plan:  Coumadin 7.5mg  po x 1 tonight Lovenox 40mg /d Daily INR D/c asa and enox when INR at goal   Banessa Mao S. Alford Highland, PharmD, BCPS Clinical Staff Pharmacist [email or web address] Alford Highland, [employer] 09/11/2021,8:57 AM

## 2021-09-11 NOTE — Transfer of Care (Signed)
Immediate Anesthesia Transfer of Care Note  Patient: Craig Schmidt  Procedure(s) Performed: TRANSESOPHAGEAL ECHOCARDIOGRAM (TEE) BUBBLE STUDY  Patient Location: PACU  Anesthesia Type:MAC  Level of Consciousness: drowsy and patient cooperative  Airway & Oxygen Therapy: Patient Spontanous Breathing and Patient connected to nasal cannula oxygen  Post-op Assessment: Report given to RN and Post -op Vital signs reviewed and stable  Post vital signs: Reviewed and stable  Last Vitals:  Vitals Value Taken Time  BP 103/71 09/11/21 1134  Temp    Pulse 82 09/11/21 1135  Resp 18 09/11/21 1135  SpO2 100 % 09/11/21 1135  Vitals shown include unvalidated device data.  Last Pain:  Vitals:   09/11/21 1040  TempSrc: Temporal  PainSc: 0-No pain      Patients Stated Pain Goal: 0 (62/13/08 6578)  Complications: No notable events documented.

## 2021-09-11 NOTE — Progress Notes (Signed)
°  Echocardiogram Echocardiogram Transesophageal has been performed.  Bobbye Charleston 09/11/2021, 11:54 AM

## 2021-09-11 NOTE — CV Procedure (Signed)
TEE  Indication:   Hx CVA   Hx mitral stenosis   Patient sedated by anesthesia with Propofol intravenously    Throat numbed with lidocaine   Bite guard placed  TEE probe advanced to mid esophagus without difficulty   Preliminary   LVEF and RVEF severely depressed   LA enlarged.    Swirling contrast consistent with lower flow state.   LA appendage without masses.  Velocities low    TV normal   Annulus  dilated      TR is eccentric  AV is mildly thickened  No AI  PV normal  No PI  MV is mildly thickened with restricted motion  Chordae appear attached predominantly to one papillary muscle Peak and mean gradients through the valve are11 and 4 mm Hg respectively. MVA by 3 D planimitry is 1.39 cm2, by Xplane planimetry it is 1.45 cm2 consistent with severe MS Mitral regurgitatiion is severe    No PFO as tested with injection of agitated saline or with color doppler   FUll report to follow in CV section of chart.  Procedure was without complication  Dorris Carnes  MD

## 2021-09-11 NOTE — Plan of Care (Addendum)
Patient in endoscopy. TEE does not show LV thrombus, LAA shows some spontaneous contrast, no obvious thrombus, No PFO. Has MR in the mild- moderate range ERO 21 mm2. The valve is degenerative with restricted motion ?rheumatic, MVA 1.39 cm2 consistent with severe MS. Otherwise, no evidence for cardioembolic cause of stroke

## 2021-09-11 NOTE — Anesthesia Preprocedure Evaluation (Signed)
Anesthesia Evaluation  Patient identified by MRN, date of birth, ID band Patient awake    Reviewed: Allergy & Precautions, NPO status , Patient's Chart, lab work & pertinent test results  Airway Mallampati: III  TM Distance: >3 FB Neck ROM: Full    Dental  (+) Poor Dentition   Pulmonary asthma , Current Smoker and Patient abstained from smoking.,    Pulmonary exam normal        Cardiovascular negative cardio ROS Normal cardiovascular exam     Neuro/Psych Brain tumor   Neuromuscular disease CVA, Residual Symptoms negative psych ROS   GI/Hepatic negative GI ROS, (+)     substance abuse  ,   Endo/Other  negative endocrine ROS  Renal/GU Renal disease     Musculoskeletal negative musculoskeletal ROS (+)   Abdominal   Peds  Hematology  (+) Blood dyscrasia, anemia ,   Anesthesia Other Findings stroke  Reproductive/Obstetrics                             Anesthesia Physical Anesthesia Plan  ASA: 3  Anesthesia Plan: MAC   Post-op Pain Management:    Induction: Intravenous  PONV Risk Score and Plan: 0 and Propofol infusion and Treatment may vary due to age or medical condition  Airway Management Planned: Nasal Cannula  Additional Equipment:   Intra-op Plan:   Post-operative Plan:   Informed Consent: I have reviewed the patients History and Physical, chart, labs and discussed the procedure including the risks, benefits and alternatives for the proposed anesthesia with the patient or authorized representative who has indicated his/her understanding and acceptance.     Dental advisory given  Plan Discussed with: CRNA  Anesthesia Plan Comments:         Anesthesia Quick Evaluation

## 2021-09-11 NOTE — Interval H&P Note (Signed)
History and Physical Interval Note:  09/11/2021 10:43 AM  Craig Schmidt  has presented today for surgery, with the diagnosis of stroke, mitral valve.  The various methods of treatment have been discussed with the patient and family. After consideration of risks, benefits and other options for treatment, the patient has consented to  Procedure(s): TRANSESOPHAGEAL ECHOCARDIOGRAM (TEE) (N/A) as a surgical intervention.  The patient's history has been reviewed, patient examined, no change in status, stable for surgery.  I have reviewed the patient's chart and labs.  Questions were answered to the patient's satisfaction.     Dorris Carnes

## 2021-09-11 NOTE — Progress Notes (Addendum)
STROKE TEAM PROGRESS NOTE   INTERVAL HISTORY Patient is seen in his room with no family at the bedside.  He has been hemodynamically stable overnight, and his neurological exam is stable.  He has many questions about his condition.  Overnight, he experienced right arm pain which prevented him from sleeping.  Vitals:   09/11/21 0804 09/11/21 1040 09/11/21 1135 09/11/21 1150  BP: (!) 140/104 (!) 152/104 103/71 127/89  Pulse: 86 90 82 86  Resp: 13 18 17  (!) 22  Temp: 97.8 F (36.6 C) (!) 97.4 F (36.3 C) 97.7 F (36.5 C)   TempSrc: Oral Temporal    SpO2:  100% 100% 100%  Weight:  80 kg    Height:  5\' 5"  (1.651 m)     CBC:  Recent Labs  Lab 09/08/21 0915 09/08/21 0919 09/10/21 0400 09/11/21 0259  WBC 10.4   < > 9.0 8.6  NEUTROABS 7.6  --   --   --   HGB 12.2*   < > 10.7* 10.6*  HCT 38.9*   < > 33.3* 33.5*  MCV 94.4   < > 89.8 91.5  PLT 242   < > 211 223   < > = values in this interval not displayed.    Basic Metabolic Panel:  Recent Labs  Lab 09/10/21 0400 09/11/21 0259  NA 134* 134*  K 4.2 4.3  CL 100 100  CO2 23 24  GLUCOSE 113* 93  BUN 25* 30*  CREATININE 1.81* 1.94*  CALCIUM 8.3* 8.4*    Lipid Panel:  Recent Labs  Lab 09/09/21 0411  CHOL 110  TRIG 60   61  HDL 21*  CHOLHDL 5.2  VLDL 12  LDLCALC 77    HgbA1c:  Recent Labs  Lab 09/09/21 0411  HGBA1C 5.6    Urine Drug Screen:  Recent Labs  Lab 09/08/21 1247  LABOPIA NONE DETECTED  COCAINSCRNUR POSITIVE*  LABBENZ NONE DETECTED  AMPHETMU NONE DETECTED  THCU NONE DETECTED  LABBARB NONE DETECTED     Alcohol Level  Recent Labs  Lab 09/08/21 0915  ETH <10     IMAGING past 24 hours No results found.  PHYSICAL EXAM General:  Alert, well-developed, well-nourished patient in no acute distress   NEURO:  Mental Status: AA&Ox3  Speech/Language: speech is without dysarthria or aphasia.    Cranial Nerves:  II: PERRL. Visual fields full.  III, IV, VI: EOMI. Eyelids elevate  symmetrically.  V: right sided facial numbness present VII: smile is symmetrical VIII: hearing intact to voice. IX, X:  Phonation is normal.  XII: tongue is midline without fasciculations. Motor: 5/5 strength to LUE and LLE, 0/5 strength to RUE and 3/5 strength to RLE Tone: is normal and bulk is normal Sensation- right sided decreased sensation present Coordination: FTN intact on left Gait- deferred  ASSESSMENT/PLAN Craig Schmidt is a 51 y.o. male with history of schwannoma, cocaine use and smoking presenting with sudden onset right sided weakness and slurred speech.  He was given TNK in the ED.  No LVO was seen on CTA head, so thrombectomy could not be performed.  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  Coumadin was started, and TEE is being performed today.  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 Generalized volume loss and left cerebellopontine angle vestibular schwannoma ASPECTS 10.    CTA head &  neck negative for LVO MRI  patchy multifocal acute ischemic infarcts in left greater than right cerebral hemispheres and cerebellum, chronic microvascular ischemic disease, 1.4 cm left vestibular schwannoma 2D Echo EF 25-30%, global left ventricular hypokinesis, grade 2 diastolic dysfunction, rheumatic mitral valve with moderate mitral stenosis, moderate to severe tricuspid regurgitation, dilated right and left atria, no atrial level shunt TEE showed no LV thrombus, LAA spontaneous contrast, mild MR and severe MS, questionable rheumatic etiology LDL 77 HgbA1c 5.6 VTE prophylaxis - lovenox No antithrombotic prior to admission, now on warfarin daily and ASA 325. Once INR at goal, will d/c ASA.  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 , MSN, AGACNP-BC [facility] See Amion for schedule and pager information 09/11/2021 12:04 PM  ATTENDING NOTE: I reviewed above note and agree with the assessment and plan. Pt was seen and examined.   No family at bedside.  Patient no acute event overnight, neuro stable.  Still has mild right facial droop and right arm plegia and right leg paresis.  TEE done showed no LV thrombus or PFO.  However LAA spontaneous contrast, mild MR and severe MS, questionable rheumatic etiology.  Continue Coumadin and aspirin, INR today 1.1, once INR at goal aspirin can be discontinued.  PT/OT recommend SNF, however patient has no insurance, will need financial counseling team to assist.  For detailed assessment and plan, please refer to above as I have made changes wherever appropriate.    [name], MD PhD Stroke Neurology 09/11/2021 1:22 PM    To contact Stroke Continuity provider, please refer to [email or web address] After hours, contact General Neurology

## 2021-09-11 NOTE — TOC CAGE-AID Note (Signed)
Transition of Care [facility]) - CAGE-AID Screening   Patient Details  Name: Craig Schmidt MRN: [id number] Date of Birth: 1970/08/08  Transition of Care [facility]) CM/SW Contact:    Taylr Meuth C Tarpley-Carter, [place] Phone Number: 09/11/2021, 12:16 PM   Clinical Narrative: Pt is unable to participate in Cage Aid. Pt is currently not in room.  CSW will assess at a better time.  Chrishaun Sasso Tarpley-Carter, MSW, LCSW-A Pronouns:  She/Her/Hers [place] Transitions of Care Clinical Social Worker Direct Number:  [phone] [email or web address]   CAGE-AID Screening: Substance Abuse Screening unable to be completed due to: : Patient unable to participate             Substance Abuse Education Offered: Yes

## 2021-09-12 DIAGNOSIS — I63512 Cerebral infarction due to unspecified occlusion or stenosis of left middle cerebral artery: Secondary | ICD-10-CM

## 2021-09-12 DIAGNOSIS — I050 Rheumatic mitral stenosis: Secondary | ICD-10-CM

## 2021-09-12 DIAGNOSIS — F1010 Alcohol abuse, uncomplicated: Secondary | ICD-10-CM

## 2021-09-12 DIAGNOSIS — I428 Other cardiomyopathies: Secondary | ICD-10-CM

## 2021-09-12 DIAGNOSIS — I69351 Hemiplegia and hemiparesis following cerebral infarction affecting right dominant side: Secondary | ICD-10-CM

## 2021-09-12 DIAGNOSIS — I959 Hypotension, unspecified: Secondary | ICD-10-CM

## 2021-09-12 LAB — CBC
HCT: 34.1 % — ABNORMAL LOW (ref 39.0–52.0)
Hemoglobin: 11.2 g/dL — ABNORMAL LOW (ref 13.0–17.0)
MCH: 29.5 pg (ref 26.0–34.0)
MCHC: 32.8 g/dL (ref 30.0–36.0)
MCV: 89.7 fL (ref 80.0–100.0)
Platelets: 252 K/uL (ref 150–400)
RBC: 3.8 MIL/uL — ABNORMAL LOW (ref 4.22–5.81)
RDW: 14 % (ref 11.5–15.5)
WBC: 8.9 K/uL (ref 4.0–10.5)
nRBC: 0 % (ref 0.0–0.2)

## 2021-09-12 LAB — BASIC METABOLIC PANEL WITH GFR
Anion gap: 7 (ref 5–15)
BUN: 26 mg/dL — ABNORMAL HIGH (ref 6–20)
CO2: 24 mmol/L (ref 22–32)
Calcium: 8.5 mg/dL — ABNORMAL LOW (ref 8.9–10.3)
Chloride: 102 mmol/L (ref 98–111)
Creatinine, Ser: 1.68 mg/dL — ABNORMAL HIGH (ref 0.61–1.24)
GFR, Estimated: 50 mL/min — ABNORMAL LOW
Glucose, Bld: 114 mg/dL — ABNORMAL HIGH (ref 70–99)
Potassium: 4.1 mmol/L (ref 3.5–5.1)
Sodium: 133 mmol/L — ABNORMAL LOW (ref 135–145)

## 2021-09-12 LAB — PROTIME-INR
INR: 1.1 (ref 0.8–1.2)
Prothrombin Time: 14.5 seconds (ref 11.4–15.2)

## 2021-09-12 MED ORDER — PROSIGHT PO TABS
1.0000 | ORAL_TABLET | Freq: Every day | ORAL | Status: DC
  Administered 2021-09-12 – 2021-10-01 (×20): 1 via ORAL
  Filled 2021-09-12 (×20): qty 1

## 2021-09-12 MED ORDER — THIAMINE HCL 100 MG PO TABS
100.0000 mg | ORAL_TABLET | Freq: Every day | ORAL | Status: DC
  Administered 2021-09-12 – 2021-10-01 (×20): 100 mg via ORAL
  Filled 2021-09-12 (×20): qty 1

## 2021-09-12 MED ORDER — SACUBITRIL-VALSARTAN 24-26 MG PO TABS
1.0000 | ORAL_TABLET | Freq: Two times a day (BID) | ORAL | Status: DC
  Administered 2021-09-12 – 2021-09-27 (×32): 1 via ORAL
  Filled 2021-09-12 (×34): qty 1

## 2021-09-12 MED ORDER — WARFARIN SODIUM 7.5 MG PO TABS
7.5000 mg | ORAL_TABLET | Freq: Once | ORAL | Status: AC
  Administered 2021-09-12: 7.5 mg via ORAL
  Filled 2021-09-12: qty 1

## 2021-09-12 MED ORDER — FOLIC ACID 1 MG PO TABS
1.0000 mg | ORAL_TABLET | Freq: Every day | ORAL | Status: DC
  Administered 2021-09-12 – 2021-10-01 (×20): 1 mg via ORAL
  Filled 2021-09-12 (×20): qty 1

## 2021-09-12 NOTE — Progress Notes (Signed)
STROKE TEAM PROGRESS NOTE   INTERVAL HISTORY Speech therapist is seen in his room with no family at the bedside.  No acute event overnight and neuro stable. Still has left UE plegia and left LE paresis. But LLE is improving. Cardiology on board, recommend outpt cardiac cath followed by mitral valve replacement surgery.   Vitals:   09/11/21 2302 09/12/21 0351 09/12/21 0749 09/12/21 1100  BP: (!) 136/91 (!) 131/95 (!) 135/101 127/86  Pulse:  90 89 95  Resp:  15 20 15   Temp: 97.7 F (36.5 C) (!) 97.4 F (36.3 C) 97.8 F (36.6 C) 98.3 F (36.8 C)  TempSrc: Oral Oral Oral Oral  SpO2:  100% 100% 100%  Weight:      Height:       CBC:  Recent Labs  Lab 09/08/21 0915 09/08/21 0919 09/11/21 0259 09/12/21 0135  WBC 10.4   < > 8.6 8.9  NEUTROABS 7.6  --   --   --   HGB 12.2*   < > 10.6* 11.2*  HCT 38.9*   < > 33.5* 34.1*  MCV 94.4   < > 91.5 89.7  PLT 242   < > 223 252   < > = values in this interval not displayed.   Basic Metabolic Panel:  Recent Labs  Lab 09/11/21 0259 09/12/21 0135  NA 134* 133*  K 4.3 4.1  CL 100 102  CO2 24 24  GLUCOSE 93 114*  BUN 30* 26*  CREATININE 1.94* 1.68*  CALCIUM 8.4* 8.5*   Lipid Panel:  Recent Labs  Lab 09/09/21 0411  CHOL 110  TRIG 60   61  HDL 21*  CHOLHDL 5.2  VLDL 12  LDLCALC 77   HgbA1c:  Recent Labs  Lab 09/09/21 0411  HGBA1C 5.6   Urine Drug Screen:  Recent Labs  Lab 09/08/21 1247  LABOPIA NONE DETECTED  COCAINSCRNUR POSITIVE*  LABBENZ NONE DETECTED  AMPHETMU NONE DETECTED  THCU NONE DETECTED  LABBARB NONE DETECTED    Alcohol Level  Recent Labs  Lab 09/08/21 0915  ETH <10    IMAGING past 24 hours No results found.  PHYSICAL EXAM General:  Alert, well-developed, well-nourished patient in no acute distress   NEURO:  Mental Status: AA&Ox3  Speech/Language: speech is without dysarthria or aphasia.    Cranial Nerves:  II: PERRL. Visual fields full.  III, IV, VI: EOMI. Eyelids elevate symmetrically.   V: right sided facial numbness present VII: smile is symmetrical VIII: hearing intact to voice. IX, X:  Phonation is normal.  XII: tongue is midline without fasciculations. Motor: 5/5 strength to LUE and LLE, 0/5 strength to RUE and 3/5 strength to RLE Tone: is normal and bulk is normal Sensation- right sided decreased sensation present Coordination: FTN intact on left Gait- deferred  ASSESSMENT/PLAN Craig Schmidt is a 51 y.o. male with history of schwannoma, cocaine use and smoking presenting with sudden onset right sided weakness and slurred speech.  He was given TNK in the ED.  No LVO was seen on CTA head, so thrombectomy could not be performed.  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  Coumadin was started, and TEE is being performed today.  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 Generalized volume loss and left cerebellopontine angle vestibular schwannoma ASPECTS 10.    CTA head & neck negative for  LVO MRI  patchy multifocal acute ischemic infarcts in left greater than right cerebral hemispheres and cerebellum, chronic microvascular ischemic disease, 1.4 cm left vestibular schwannoma 2D Echo EF 25-30%, global left ventricular hypokinesis, grade 2 diastolic dysfunction, rheumatic mitral valve with moderate mitral stenosis, moderate to severe tricuspid regurgitation, dilated right and left atria, no atrial level shunt TEE showed no LV thrombus, LAA spontaneous contrast, mild MR and severe MS, questionable rheumatic etiology Cardiology recommend 30 day monitoring to rule out afib as outpt LDL 77 HgbA1c 5.6 VTE prophylaxis - lovenox No antithrombotic prior to admission, now on warfarin daily and ASA 325. Once INR at goal, will d/c ASA.  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 Once INR at goal, will d/c ASA  If 30 day cardiac event monitoring found afib, he will need MAZE and LAA resection too.   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  Tobacco abuse Current smoker Smoking cessation counseling provided Nicotine patch provided Pt is willing to quit  Cocaine abuse Cocaine cessation education provided Patient is willing to quit Okay with Coreg per cardiology  Alcohol abuse CIWA protocol On FA/B1/MVI Watch for alcohol withdraw symptoms  Other Stroke Risk Factors   Other Active Problems Right arm pain Patient c/o right arm pain which prevented him from sleeping, acetaminophen partially effective No edema or deformity noted to right arm PRN tramodol ordered  Hospital day # 4   [name], MD PhD Stroke Neurology 09/12/2021 1:23 PM    To contact Stroke Continuity provider, please refer to [email or web address] After hours, contact General Neurology

## 2021-09-12 NOTE — Progress Notes (Signed)
Physical Therapy Treatment Patient Details Name: Craig Schmidt MRN: [id number] DOB: [birth year] Today's Date: 09/12/2021   History of Present Illness Craig Schmidt is a 51 y.o. male presented to the emergency room for sudden onset of right-sided weakness. TNKase given. 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 PHMx:schwannoma, cocaine abuse which he says he has not used over the past few weeks.    PT Comments    Pt explained that he was unable to get out of bed and without help, indicating and awareness of deficits. He had trouble focusing on tasks and required extra cuing. He needed multimodal cuing to sit up straight on EOB and was unable to weight shift from side to side because he kept trying to take steps forward. He was able to stand up from elevated EOB with Min assistance and take steps with min assistance for balance. During treatment session patient showed deficits in strength, endurance, activity tolerance, and focus. Recommending therapy services at skilled nursing facility to address the previously stated deficits. Will continue to follow acutely to maximize functional mobility, independence and safety.      Recommendations for follow up therapy are one component of a multi-disciplinary discharge planning process, led by the attending physician.  Recommendations may be updated based on patient status, additional functional criteria and insurance authorization.  Follow Up Recommendations  Skilled nursing-short term rehab (<3 hours/day)     Assistance Recommended at Discharge Intermittent Supervision/Assistance  Patient can return home with the following A lot of help with walking and/or transfers;A lot of help with bathing/dressing/bathroom;Assistance with cooking/housework;Assistance with feeding;Assist for  transportation;Help with stairs or ramp for entrance;Direct supervision/assist for financial management;Direct supervision/assist for medications management   Equipment Recommendations  Wheelchair (measurements PT);Wheelchair cushion (measurements PT);Hospital bed    Recommendations for Other Services       Precautions / Restrictions Precautions Precautions: Fall Restrictions Weight Bearing Restrictions: No Other Position/Activity Restrictions: R UE is flaccid     Mobility  Bed Mobility Overal bed mobility: Needs Assistance Bed Mobility: Sidelying to Sit Rolling: Min assist Sidelying to sit: HOB elevated, Mod assist       General bed mobility comments: Pt had improve bed mobility requiring mod assist to manage his R UE and LE and mod assist to get to sitting at EOB.    Transfers Overall transfer level: Needs assistance Equipment used: Rolling walker (2 wheels) Transfers: Sit to/from Stand Sit to Stand: Min assist           General transfer comment: Pt was able to stand with min assit and use a rolling walker for support with his L UE and therapist supporting his right arm.    Ambulation/Gait Ambulation/Gait assistance: Mod assist, +2 safety/equipment Gait Distance (Feet): 5 Feet Assistive device: Rolling walker (2 wheels) Gait Pattern/deviations: Decreased stride length, Step-to pattern, Decreased stance time - right       General Gait Details: Pt was able to ambulate with mod assist +2 for chair follow. Therapist supported R UE provided assistance and managed the walker with foot. Pt needed cuing to take bigger steps with R LE, not let the walker get too far infront, and look ahead at the door to have more upright posture.   Stairs             Wheelchair Mobility    Modified Rankin (Stroke Patients Only)  Balance Overall balance assessment: Needs assistance Sitting-balance support: Feet supported, Single extremity supported Sitting  balance-Leahy Scale: Poor Sitting balance - Comments: Pt need min guard to min assist for sitting balance. He required repeated cuing to maintain upright posture.     Standing balance-Leahy Scale: Poor Standing balance comment: Pt was able to stand with Min Assist and cuing for upright posture. L UE supporting him on the walker, therapist was supporting R UE with hand held assist. Pt was unable to focus on weightshift exercise because he was eager to take steps.                            Cognition Arousal/Alertness: Awake/alert Behavior During Therapy: WFL for tasks assessed/performed Overall Cognitive Status: No family/caregiver present to determine baseline cognitive functioning Area of Impairment: Awareness, Memory                               General Comments: Pt explained that he was not allowed to get up without help. He did not remeber sitting up on the EOB in the previous physical therapy session. Pt was unable to focus on weight shift activity becuase he was trying to take steps.        Exercises      General Comments        Pertinent Vitals/Pain Pain Assessment Pain Location: R arm when moved too much "feels like vibrating" "feels like pins and needles". He had a little bit of a headache with sitting up out of bed. Pain Descriptors / Indicators: Aching, Pins and needles, Headache Pain Intervention(s): Monitored during session    Home Living                          Prior Function            PT Goals (current goals can now be found in the care plan section)      Frequency    Min 3X/week      PT Plan Current plan remains appropriate    Co-evaluation              AM-PAC PT "6 Clicks" Mobility   Outcome Measure  Help needed turning from your back to your side while in a flat bed without using bedrails?: A Little Help needed moving from lying on your back to sitting on the side of a flat bed without using  bedrails?: A Lot Help needed moving to and from a bed to a chair (including a wheelchair)?: A Lot Help needed standing up from a chair using your arms (e.g., wheelchair or bedside chair)?: A Little Help needed to walk in hospital room?: A Lot Help needed climbing 3-5 steps with a railing? : Total 6 Click Score: 13    End of Session Equipment Utilized During Treatment: Gait belt Activity Tolerance: Patient limited by pain Patient left: with call bell/phone within reach;in chair;with chair alarm set Nurse Communication: Mobility status PT Visit Diagnosis: Other abnormalities of gait and mobility (R26.89);Muscle weakness (generalized) (M62.81);Hemiplegia and hemiparesis Hemiplegia - Right/Left: Right Hemiplegia - caused by: Cerebral infarction     Time: 4034-7425 PT Time Calculation (min) (ACUTE ONLY): 31 min  Charges:  $Therapeutic Activity: 8-22 mins $Neuromuscular Re-education: 8-22 mins                     Merry Proud  Dalilah Curlin, SPT    Quenton Fetter 09/12/2021, 5:56 PM

## 2021-09-12 NOTE — Progress Notes (Signed)
Cardiology Progress Note  Patient ID: Craig Schmidt MRN: [id number] DOB: 01-23-1971 Date of Encounter: 09/12/2021  Primary Cardiologist: None  Subjective   Chief Complaint: None.   HPI:  Doing well today, no issues. We discussed the findings of his TEE.  ROS:  All other ROS reviewed and negative. Pertinent positives noted in the HPI.     Inpatient Medications  Scheduled Meds:  aspirin EC  325 mg Oral Daily   atorvastatin  40 mg Oral Daily   carvedilol  12.5 mg Oral BID WC   empagliflozin  10 mg Oral Daily   enoxaparin (LOVENOX) injection  40 mg Subcutaneous Q24H   gabapentin  300 mg Oral QHS   isosorbide-hydrALAZINE  1 tablet Oral TID   melatonin  3 mg Oral QHS   nicotine  21 mg Transdermal Daily   ondansetron (ZOFRAN) IV  4 mg Intravenous Once   pantoprazole  40 mg Oral Daily   sodium chloride flush  3 mL Intravenous Once   Warfarin - Pharmacist Dosing Inpatient   Does not apply q1600   Continuous Infusions:   PRN Meds: acetaminophen **OR** acetaminophen (TYLENOL) oral liquid 160 mg/5 mL **OR** acetaminophen, senna-docusate, sodium chloride, traMADol, warfarin   Vital Signs   Vitals:   09/11/21 2040 09/11/21 2302 09/12/21 0351 09/12/21 0749  BP: 133/88 (!) 136/91 (!) 131/95 (!) 135/101  Pulse: 90  90 89  Resp: 15  15 20   Temp: 97.6 F (36.4 C) 97.7 F (36.5 C) (!) 97.4 F (36.3 C) 97.8 F (36.6 C)  TempSrc:  Oral Oral Oral  SpO2: 97%  100% 100%  Weight:      Height:        Intake/Output Summary (Last 24 hours) at 09/12/2021 1003 Last data filed at 09/12/2021 0900 Gross per 24 hour  Intake 1230 ml  Output 1375 ml  Net -145 ml   Last 3 Weights 09/11/2021 09/08/2021 09/08/2021  Weight (lbs) 176 lb 5.9 oz 176 lb 5.9 oz 176 lb 5.9 oz  Weight (kg) 80 kg 80 kg 80 kg      Telemetry  Sinus rhythm  Physical Exam   Vitals:   09/11/21 2040 09/11/21 2302 09/12/21 0351 09/12/21 0749  BP: 133/88 (!) 136/91 (!) 131/95 (!) 135/101  Pulse: 90  90 89  Resp:  15  15 20   Temp: 97.6 F (36.4 C) 97.7 F (36.5 C) (!) 97.4 F (36.3 C) 97.8 F (36.6 C)  TempSrc:  Oral Oral Oral  SpO2: 97%  100% 100%  Weight:      Height:        Intake/Output Summary (Last 24 hours) at 09/12/2021 1003 Last data filed at 09/12/2021 0900 Gross per 24 hour  Intake 1230 ml  Output 1375 ml  Net -145 ml    Last 3 Weights 09/11/2021 09/08/2021 09/08/2021  Weight (lbs) 176 lb 5.9 oz 176 lb 5.9 oz 176 lb 5.9 oz  Weight (kg) 80 kg 80 kg 80 kg    Body mass index is 29.35 kg/m.   Physical Exam Gen: well appearing Neuro: alert and oriented, R arm hemiparesis CV: r,r,r no murmurs. No sig JVD Vasc: 2+ radial pulses Pulm: CLAB Abd: non distended Ext: No LE edema Skin: warm and well perfused Psych: normal mood   Labs  High Sensitivity Troponin:   Recent Labs  Lab 09/08/21 1031 09/08/21 1303  TROPONINIHS 406* 312*     Cardiac EnzymesNo results for input(s): TROPONINI in the last 168 hours. No results for  input(s): TROPIPOC in the last 168 hours.  Chemistry Recent Labs  Lab 09/08/21 0915 09/08/21 0919 09/10/21 0400 09/11/21 0259 09/12/21 0135  NA 139   < > 134* 134* 133*  K 3.9   < > 4.2 4.3 4.1  CL 107   < > 100 100 102  CO2 22   < > 23 24 24   GLUCOSE 75   < > 113* 93 114*  BUN 19   < > 25* 30* 26*  CREATININE 1.95*   < > 1.81* 1.94* 1.68*  CALCIUM 8.8*   < > 8.3* 8.4* 8.5*  PROT 6.3*  --   --   --   --   ALBUMIN 2.9*  --   --   --   --   AST 59*  --   --   --   --   ALT 44  --   --   --   --   ALKPHOS 132*  --   --   --   --   BILITOT 0.7  --   --   --   --   GFRNONAA 41*   < > 45* 42* 50*  ANIONGAP 10   < > 11 10 7    < > = values in this interval not displayed.    Hematology Recent Labs  Lab 09/10/21 0400 09/11/21 0259 09/12/21 0135  WBC 9.0 8.6 8.9  RBC 3.71* 3.66* 3.80*  HGB 10.7* 10.6* 11.2*  HCT 33.3* 33.5* 34.1*  MCV 89.8 91.5 89.7  MCH 28.8 29.0 29.5  MCHC 32.1 31.6 32.8  RDW 14.0 14.1 14.0  PLT 211 223 252   BNP Recent  Labs  Lab 09/10/21 0400  BNP 1,570.7*    DDimer No results for input(s): DDIMER in the last 168 hours.   Radiology  ECHO TEE  Result Date: 09/11/2021    TRANSESOPHOGEAL ECHO REPORT   Patient Name:   Craig Schmidt Date of Exam: 09/11/2021 Medical Rec #:  [id number]          Height:       65.0 in Accession #:    [id number]         Weight:       176.4 lb Date of Birth:  1970-08-12           BSA:          1.875 m Patient Age:    63 years           BP:           140/104 mmHg Patient Gender: M                  HR:           88 bpm. 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 Stroke; Risk Factors:Current Smoker. Severe                  TR. MR. Cocaine use.  Sonographer:     Roseanna Rainbow RDCS Referring Phys:  [place] Diagnosing Phys: Dorris Carnes MD  PROCEDURE: After discussion of the risks and benefits of a TEE, an informed consent was obtained from the patient. The transesophogeal probe was passed without difficulty through the esophogus of the patient. Imaged were obtained with the patient in a left lateral decubitus position. Sedation performed by different physician. The patient was monitored while under deep sedation. Anesthestetic sedation was provided intravenously by Anesthesiology: 302mg  of Propofol. The patient's vital signs; including heart rate, blood pressure, and oxygen saturation; remained stable throughout the procedure. The patient developed no complications during the procedure. IMPRESSIONS  1. Left ventricular ejection fraction, by estimation, is 25 to 30%. The left ventricle has severely decreased function. The left ventricle demonstrates global hypokinesis.  2. Right ventricular systolic function is  severely reduced. The right ventricular size is normal.  3. No left atrial/left atrial appendage thrombus was detected.  4. MV is stenotic, with abnormal attachment to one predominant papillary muscle (parachute mitral valve). Peak and mean gradients through the valve are 11 and 4 mm Hg respectively. MVA by Xplane planimetry is 1.45 cm2 consistent with severe MS (image 144). MR is moderate with EROA of 0.21 cm2 and MV volume of 34 ml. . The mitral valve is abnormal. Moderate mitral valve regurgitation.  5. The aortic valve is tricuspid. Aortic valve regurgitation is not visualized.  6. Agitated saline contrast bubble study was negative, with no evidence of any interatrial shunt. FINDINGS  Left Ventricle: Left ventricular ejection fraction, by estimation, is 25 to 30%. The left ventricle has severely decreased function. The left ventricle demonstrates global hypokinesis. The left ventricular internal cavity size was normal in size. Right Ventricle: The right ventricular size is normal. Right vetricular wall thickness was not assessed. Right ventricular systolic function is severely reduced. Left Atrium: Left atrial size was normal in size. No left atrial/left atrial appendage thrombus was detected. Right Atrium: Right atrial size was normal in size. Pericardium: There is no evidence of pericardial effusion. Mitral Valve: MV is stenotic, with abnormal attachment to one predominant papillary muscle (parachute mitral valve). Peak and mean gradients through the valve are 11 and 4 mm Hg respectively. MVA by Xplane planimetry is 1.45 cm2 consistent with severe MS  (image 144). MR is moderate with EROA of 0.21 cm2 and MV volume of 34 ml. The mitral valve is abnormal. Moderate mitral valve regurgitation. MV peak gradient, 11.2 mmHg. The mean mitral valve gradient is 4.0 mmHg. Tricuspid Valve: The tricuspid valve is normal in structure. Tricuspid valve regurgitation is mild. Aortic Valve: The aortic valve is tricuspid. Aortic  valve regurgitation is not visualized. Pulmonic Valve: The pulmonic valve was normal in structure. Pulmonic valve regurgitation is not visualized. Aorta: The aortic root is normal in size and structure. IAS/Shunts: No atrial level shunt detected by color flow Doppler. Agitated saline contrast was given intravenously to evaluate for intracardiac shunting. Agitated saline contrast bubble study was negative, with no evidence of any interatrial shunt.  MITRAL VALVE MV Peak grad: 11.2 mmHg MV Mean grad: 4.0 mmHg MV Vmax:      1.67 m/s MV Vmean:     81.2 cm/s MR Peak grad:    112.8 mmHg MR Mean grad:    71.0 mmHg MR Vmax:         531.00 cm/s MR Vmean:        383.0 cm/s MR PISA:         4.02 cm MR PISA Eff ROA: 28 mm MR PISA Radius:  0.80 cm Dorris Carnes MD Electronically signed by  Dorris Carnes MD Signature Date/Time: 09/11/2021/3:32:10 PM    Final     Cardiac Studies  TTE 09/08/2021  1. Left ventricular ejection fraction, by estimation, is 25 to 30%. The  left ventricle has severely decreased function. The left ventricle  demonstrates global hypokinesis. Left ventricular diastolic parameters are  consistent with Grade II diastolic  dysfunction (pseudonormalization).   2. Right ventricular systolic function is mildly reduced. The right  ventricular size is normal. There is moderately elevated pulmonary artery  systolic pressure. The estimated right ventricular systolic pressure is  76.7 mmHg.   3. Left atrial size was mild to moderately dilated.   4. Right atrial size was mildly dilated.   5. The mitral valve is rheumatic. Mild to moderate mitral valve  regurgitation. Moderate mitral stenosis. The mean mitral valve gradient is  6.0 mmHg with MVA 1.3 cm^2 by VTI.   6. Tricuspid valve regurgitation is moderate to severe.   7. The aortic valve is tricuspid. Aortic valve regurgitation is not  visualized. Aortic valve sclerosis/calcification is present, without any  evidence of aortic stenosis.   8. The  inferior vena cava is dilated in size with <50% respiratory  variability, suggesting right atrial pressure of 15 mmHg.   Patient Profile  Craig Schmidt is a 51 y.o. 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 He is status post IV TNKase.  Cardiology consulted for new onset systolic heart failure as well as mitral valve stenosis.  Assessment & Plan   #Acute ischemic stroke: TEE did not show thrombus in LAA or LV. No PFO. However, with high risk, will recommend continuing anticoagulation. No known hx of atrial fibrillation, no afib here. 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 Patient reports strep infection as a child treated with PCN - underwent TEE 2/20, showed restricted leaflets c/f  rheumatic heart disease. MVA area by planimetry 1.45 cm2 which is severe. Moderate MR EOA 21. [place] ~ 9. Will plan for referral to [facility] for mitral valve replacement as an outpatient  #New onset NICM, EF 25-30%;Dilated cardiomyopathy likely in the setting of uncontrolled hypertension as well as cocaine abuse and etoh. Complicated by underfilling of the LV with severe MS. He did not present with ACS. Crt baseline 1.7 with CKD; at baseline - can plan for outpatient LHC prior to evaluation for surgery after rehab - he is euvolemic, was not significantly decompensated - will start low dose entresto  -Continue Coreg 12.5 mg twice daily.   -BiDil 20-37.5 mg 3 times daily   -continue Jardiance 10 mg daily.  -Goal for substance abuse cessation - continue statin  #Elevated troponin -Suspect demand in setting of hypertension and systolic heart failure.  Not an acute  coronary syndrome.  On statin. -On aspirin 325 mg daily.  Transition to 81 mg daily once INR is therapeutic.  Will plan for cardiology follow up in 4-6 weeks with plan for [facility] and outpatient referral for MVR. Recommend 30 day event monitor. If afib can consider LAA excision and MAZE. Otherwise, he is stable from a cardiac standpoint for discharge to SNF  For questions or updates, please contact [place] Please consult [email or web address] for contact info under  Janina Mayo, MD

## 2021-09-12 NOTE — Anesthesia Postprocedure Evaluation (Signed)
Anesthesia Post Note  Patient: Craig Schmidt  Procedure(s) Performed: TRANSESOPHAGEAL ECHOCARDIOGRAM (TEE) BUBBLE STUDY     Patient location during evaluation: Endoscopy Anesthesia Type: MAC Level of consciousness: awake Pain management: pain level controlled Vital Signs Assessment: post-procedure vital signs reviewed and stable Respiratory status: spontaneous breathing, nonlabored ventilation, respiratory function stable and patient connected to nasal cannula oxygen Cardiovascular status: stable and blood pressure returned to baseline Postop Assessment: no apparent nausea or vomiting Anesthetic complications: no   No notable events documented.  Last Vitals:  Vitals:   09/12/21 0351 09/12/21 0749  BP: (!) 131/95 (!) 135/101  Pulse: 90 89  Resp: 15 20  Temp: (!) 36.3 C 36.6 C  SpO2: 100% 100%    Last Pain:  Vitals:   09/12/21 0749  TempSrc: Oral  PainSc:                  Craig Schmidt

## 2021-09-12 NOTE — Progress Notes (Signed)
ANTICOAGULATION CONSULT NOTE - Follow Up Consult  Pharmacy Consult for Warfarin Indication:  rheumatic valve stenosis;  CVA  Allergies  Allergen Reactions   Peanut-Containing Drug Products Itching   Shellfish Allergy Swelling    Patient Measurements: Height: 5\' 5"  (165.1 cm) Weight: 80 kg (176 lb 5.9 oz) IBW/kg (Calculated) : 61.5  Vital Signs: Temp: (P) 98.3 F (36.8 C) (02/21 1100) Temp Source: (P) Oral (02/21 1100) BP: (P) 127/86 (02/21 1100) Pulse Rate: (P) 95 (02/21 1100)  Labs: Recent Labs    09/10/21 0400 09/11/21 0259 09/12/21 0135  HGB 10.7* 10.6* 11.2*  HCT 33.3* 33.5* 34.1*  PLT 211 223 252  LABPROT 15.2 14.6 14.5  INR 1.2 1.1 1.1  CREATININE 1.81* 1.94* 1.68*    Estimated Creatinine Clearance: 51.8 mL/min (A) (by C-G formula based on SCr of 1.68 mg/dL (H)).  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 Pharmacy consulted to dose warfarin on 2/18. S/p TNK on 2/17 at ~9:30am. No thrombus on TEE. No hx atrial fibrillation. Noted plan 30-day cardiac even monitor as outpatient.   INR remains 1.1 after Warfarin 5 mg x 2 days, then 7.5 mg yesterday.  On Lovenox 40 mg SQ q24h and Aspirin 325 mg daily.    Goal of Therapy:  INR 2-3 Monitor platelets by anticoagulation protocol: Yes   Plan:  Warfarin 7.5 mg x 1 again today.   Daily PT/INR Lovenox and Aspirin to stop when INR at goal.  Arty Baumgartner, RPh 09/12/2021,12:15 PM

## 2021-09-12 NOTE — Progress Notes (Signed)
Speech Language Pathology Treatment: Cognitive-Linquistic  Patient Details Name: Craig Schmidt MRN: [id number] DOB: [birth year] Today's Date: 09/12/2021 Time: 1115-1200 SLP Time Calculation (min) (ACUTE ONLY): 45 min  Assessment / Plan / Recommendation Clinical Impression  Craig Schmidt communication has improved since initial assessment two days ago.  He demonstrates improved fluency and fewer pauses during word retrieval.  He is able to convey propositional/novel language with good organization and appropriate semantics and syntax.  He demonstrates improved comprehension of novel/abstract language.  He was able to discriminate left from right today with 90% accuracy after initial cue to pause before responding.  Pt with good insight into his deficits and is introspective about his situation.  No further SLP intervention is needed - our service will sign off.   HPI HPI: Craig Schmidt is a 51 y.o. male presented to the emergency room for sudden onset of right-sided weakness. TNKase given. 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 PHMx:schwannoma, cocaine abuse which he says he has not used over the past few weeks; schizophrenia, homelessness.      SLP Plan  All goals met      Recommendations for follow up therapy are one component of a multi-disciplinary discharge planning process, led by the attending physician.  Recommendations may be updated based on patient status, additional functional criteria and insurance authorization.    Recommendations                   Oral Care Recommendations: Oral care BID Follow Up Recommendations: No SLP follow up Plan: All goals met          Craig Schmidt L. Craig Schmidt, [place] Office number [phone] Pager [phone]  Craig Schmidt  09/12/2021, 12:59 PM

## 2021-09-13 LAB — CBC
HCT: 39.2 % (ref 39.0–52.0)
Hemoglobin: 12.8 g/dL — ABNORMAL LOW (ref 13.0–17.0)
MCH: 29.2 pg (ref 26.0–34.0)
MCHC: 32.7 g/dL (ref 30.0–36.0)
MCV: 89.3 fL (ref 80.0–100.0)
Platelets: 312 10*3/uL (ref 150–400)
RBC: 4.39 MIL/uL (ref 4.22–5.81)
RDW: 13.8 % (ref 11.5–15.5)
WBC: 9.4 10*3/uL (ref 4.0–10.5)
nRBC: 0 % (ref 0.0–0.2)

## 2021-09-13 LAB — BASIC METABOLIC PANEL
Anion gap: 9 (ref 5–15)
BUN: 23 mg/dL — ABNORMAL HIGH (ref 6–20)
CO2: 23 mmol/L (ref 22–32)
Calcium: 8.4 mg/dL — ABNORMAL LOW (ref 8.9–10.3)
Chloride: 101 mmol/L (ref 98–111)
Creatinine, Ser: 1.48 mg/dL — ABNORMAL HIGH (ref 0.61–1.24)
GFR, Estimated: 58 mL/min — ABNORMAL LOW (ref >60)
Glucose, Bld: 136 mg/dL — ABNORMAL HIGH (ref 70–99)
Potassium: 4.2 mmol/L (ref 3.5–5.1)
Sodium: 133 mmol/L — ABNORMAL LOW (ref 135–145)

## 2021-09-13 LAB — PROTIME-INR
INR: 1.5 — ABNORMAL HIGH (ref 0.8–1.2)
Prothrombin Time: 18.3 seconds — ABNORMAL HIGH (ref 11.4–15.2)

## 2021-09-13 MED ORDER — WARFARIN SODIUM 7.5 MG PO TABS
7.5000 mg | ORAL_TABLET | Freq: Once | ORAL | Status: AC
  Administered 2021-09-13: 7.5 mg via ORAL
  Filled 2021-09-13: qty 1

## 2021-09-13 NOTE — Progress Notes (Signed)
Occupational Therapy Treatment Patient Details Name: MENDEL BINSFELD MRN: [id number] DOB: 1970/08/05 Today's Date: 09/13/2021   History of present illness ABRAN GAVIGAN is a 51 y.o. male presented to the emergency room for sudden onset of right-sided weakness. TNKase given. 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 PHMx:schwannoma, cocaine abuse which he says he has not used over the past few weeks.   OT comments  Patient received in supine and agreeable to transition to recliner for lunch. Patient was mod assist to get to EOB due to assistance needed with RUE/RLE.  Patient stood from EOB to RW and transferred to to recliner with min/mod assist due to assistance needed to maneuver RW and verbal cues for safety. Patient required setup for self feeding. Acute OT to continue to follow.    Recommendations for follow up therapy are one component of a multi-disciplinary discharge planning process, led by the attending physician.  Recommendations may be updated based on patient status, additional functional criteria and insurance authorization.    Follow Up Recommendations  Skilled nursing-short term rehab (<3 hours/day)    Assistance Recommended at Discharge Frequent or constant Supervision/Assistance  Patient can return home with the following  Two people to help with walking and/or transfers;Two people to help with bathing/dressing/bathroom;Assist for transportation;Assistance with cooking/housework;Direct supervision/assist for financial management;Direct supervision/assist for medications management   Equipment Recommendations  Other (comment) (TBD)    Recommendations for Other Services      Precautions / Restrictions Precautions Precautions: Fall Restrictions Weight Bearing Restrictions: No       Mobility Bed Mobility Overal  bed mobility: Needs Assistance Bed Mobility: Sidelying to Sit   Sidelying to sit: HOB elevated, Mod assist       General bed mobility comments: assistance for RUE and RLE    Transfers Overall transfer level: Needs assistance Equipment used: Rolling walker (2 wheels) Transfers: Sit to/from Stand Sit to Stand: Min assist     Step pivot transfers: Min assist, Mod assist     General transfer comment: assistance needed with RUE on RW and verbal cues for safety     Balance Overall balance assessment: Needs assistance Sitting-balance support: Feet supported, Single extremity supported Sitting balance-Leahy Scale: Poor Sitting balance - Comments: min guard for sitting balance on EOB   Standing balance support: Bilateral upper extremity supported Standing balance-Leahy Scale: Poor Standing balance comment: stood from EOB to RW with assistance for RUE on walker                           ADL either performed or assessed with clinical judgement   ADL Overall ADL's : Needs assistance/impaired Eating/Feeding: Set up;Sitting Eating/Feeding Details (indicate cue type and reason): provided setup in recliner                                   General ADL Comments: patient able to feed self following setup    Extremity/Trunk Assessment Upper Extremity Assessment RUE Deficits / Details: flaccid RUE Sensation: decreased light touch RUE Coordination: decreased fine motor;decreased gross motor            Vision       Perception     Praxis      Cognition Arousal/Alertness: Awake/alert Behavior During Therapy: WFL for tasks assessed/performed Overall  Cognitive Status: No family/caregiver present to determine baseline cognitive functioning Area of Impairment: Awareness, Memory                       Following Commands: Follows one step commands inconsistently Safety/Judgement: Decreased awareness of safety, Decreased awareness of deficits    Problem Solving: Decreased initiation, Difficulty sequencing, Requires verbal cues, Requires tactile cues General Comments: aware of RUE limitations, followed directions well        Exercises      Shoulder Instructions       General Comments      Pertinent Vitals/ Pain       Pain Assessment Pain Assessment: 0-10 Pain Score: 2  Pain Location: right arm when moved/positioned Pain Descriptors / Indicators: Aching, Grimacing Pain Intervention(s): Monitored during session, Repositioned  Home Living                                          Prior Functioning/Environment              Frequency  Min 2X/week        Progress Toward Goals  OT Goals(current goals can now be found in the care plan section)  Progress towards OT goals: Progressing toward goals  Acute Rehab OT Goals Patient Stated Goal: get better OT Goal Formulation: With patient Time For Goal Achievement: 09/23/21 Potential to Achieve Goals: Good ADL Goals Pt Will Perform Grooming: with set-up;with supervision;sitting Pt Will Perform Upper Body Bathing: with min assist;sitting Pt Will Perform Lower Body Bathing: with min assist;sit to/from stand Pt Will Transfer to Toilet: with min assist;with transfer board;bedside commode Pt/caregiver will Perform Home Exercise Program: Increased ROM;Right Upper extremity;With written HEP provided Additional ADL Goal #1: Pt will be S in and OOB for basic ADLs Additional ADL Goal #2: Continue to assess vision  Plan Discharge plan remains appropriate    Co-evaluation                 AM-PAC OT "6 Clicks" Daily Activity     Outcome Measure   Help from another person eating meals?: A Little Help from another person taking care of personal grooming?: A Lot Help from another person toileting, which includes using toliet, bedpan, or urinal?: Total Help from another person bathing (including washing, rinsing, drying)?: A Lot Help from another  person to put on and taking off regular upper body clothing?: Total Help from another person to put on and taking off regular lower body clothing?: Total 6 Click Score: 10    End of Session Equipment Utilized During Treatment: Gait belt;Rolling walker (2 wheels)  OT Visit Diagnosis: Other abnormalities of gait and mobility (R26.89);Muscle weakness (generalized) (M62.81);Other symptoms and signs involving cognitive function;Hemiplegia and hemiparesis;Pain Hemiplegia - Right/Left: Right Hemiplegia - dominant/non-dominant: Dominant Pain - Right/Left: Right Pain - part of body: Hand;Arm   Activity Tolerance Patient tolerated treatment well   Patient Left in chair;with call bell/phone within reach;with chair alarm set   Nurse Communication Mobility status        Time: 7124-5809 OT Time Calculation (min): 14 min  Charges: OT General Charges $OT Visit: 1 Visit OT Treatments $Therapeutic Activity: 8-22 mins  Lodema Hong, [place]  Pager [phone] Office [place] 09/13/2021, 3:37 PM

## 2021-09-13 NOTE — Plan of Care (Signed)
  Problem: Education: Goal: Knowledge of disease or condition will improve Outcome: Progressing Goal: Knowledge of secondary prevention will improve (SELECT ALL) Outcome: Progressing Goal: Knowledge of patient specific risk factors will improve (INDIVIDUALIZE FOR PATIENT) Outcome: Progressing Goal: Individualized Educational Video(s) Outcome: Progressing   

## 2021-09-13 NOTE — Plan of Care (Signed)
  Problem: Education: Goal: Knowledge of disease or condition will improve Outcome: Progressing Goal: Knowledge of secondary prevention will improve (SELECT ALL) Outcome: Progressing   

## 2021-09-13 NOTE — Progress Notes (Signed)
STROKE TEAM PROGRESS NOTE   INTERVAL HISTORY Pt is sitting in chair, no acute event overnight. Neuro stable, stated that he walked with PT today but very weak. Still has right UE plegia.   Vitals:   09/12/21 2350 09/13/21 0402 09/13/21 0759 09/13/21 1150  BP: 116/77 125/79 118/78 135/72  Pulse: 90 90 87 93  Resp: (!) 21 (!) 21 20 18   Temp: 97.6 F (36.4 C) 98.1 F (36.7 C) 97.9 F (36.6 C) 98.3 F (36.8 C)  TempSrc: Oral Oral Oral Oral  SpO2: 100% 100% 100%   Weight:      Height:       CBC:  Recent Labs  Lab 09/08/21 0915 09/08/21 0919 09/12/21 0135 09/13/21 0421  WBC 10.4   < > 8.9 9.4  NEUTROABS 7.6  --   --   --   HGB 12.2*   < > 11.2* 12.8*  HCT 38.9*   < > 34.1* 39.2  MCV 94.4   < > 89.7 89.3  PLT 242   < > 252 312   < > = values in this interval not displayed.   Basic Metabolic Panel:  Recent Labs  Lab 09/12/21 0135 09/13/21 0421  NA 133* 133*  K 4.1 4.2  CL 102 101  CO2 24 23  GLUCOSE 114* 136*  BUN 26* 23*  CREATININE 1.68* 1.48*  CALCIUM 8.5* 8.4*   Lipid Panel:  Recent Labs  Lab 09/09/21 0411  CHOL 110  TRIG 60   61  HDL 21*  CHOLHDL 5.2  VLDL 12  LDLCALC 77   HgbA1c:  Recent Labs  Lab 09/09/21 0411  HGBA1C 5.6   Urine Drug Screen:  Recent Labs  Lab 09/08/21 1247  LABOPIA NONE DETECTED  COCAINSCRNUR POSITIVE*  LABBENZ NONE DETECTED  AMPHETMU NONE DETECTED  THCU NONE DETECTED  LABBARB NONE DETECTED    Alcohol Level  Recent Labs  Lab 09/08/21 0915  ETH <10    IMAGING past 24 hours No results found.  PHYSICAL EXAM General:  Alert, well-developed, well-nourished patient in no acute distress   NEURO:  Mental Status: AA&Ox3  Speech/Language: speech is without dysarthria or aphasia.    Cranial Nerves:  II: PERRL. Visual fields full.  III, IV, VI: EOMI. Eyelids elevate symmetrically.  V: right sided facial numbness present VII: right mild facial droop VIII: hearing intact to voice. IX, X:  Phonation is normal.   XII: tongue is midline without fasciculations. Motor: 5/5 strength to LUE and LLE, 0/5 strength to RUE and 3/5 strength to RLE Tone: is normal and bulk is normal Sensation- right sided decreased sensation present Coordination: FTN intact on left Gait- deferred  ASSESSMENT/PLAN [name] is a 51 y.o. male with history of schwannoma, cocaine use and smoking presenting with sudden onset right sided weakness and slurred speech.  He was given TNK in the ED.  No LVO was seen on CTA head, so thrombectomy could not be performed.  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  Coumadin was started, and TEE is being performed today.  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 Generalized volume loss and left cerebellopontine angle vestibular schwannoma ASPECTS 10.    CTA head & neck negative for LVO MRI  patchy multifocal acute ischemic infarcts in left greater than right cerebral hemispheres and cerebellum, chronic microvascular ischemic disease, 1.4  cm left vestibular schwannoma 2D Echo EF 25-30%, global left ventricular hypokinesis, grade 2 diastolic dysfunction, rheumatic mitral valve with moderate mitral stenosis, moderate to severe tricuspid regurgitation, dilated right and left atria, no atrial level shunt TEE showed no LV thrombus, LAA spontaneous contrast, mild MR and severe MS, questionable rheumatic etiology Cardiology recommend 30 day monitoring to rule out afib as outpt LDL 77 HgbA1c 5.6 VTE prophylaxis - lovenox No antithrombotic prior to admission, now on warfarin daily and ASA 325. Once INR at goal, will d/c ASA.  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 Once INR at goal, will d/c ASA Per card, if 30 day cardiac event monitoring found afib, he will need MAZE and LAA resection too.   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  Tobacco abuse Current smoker Smoking cessation counseling provided Nicotine patch provided Pt is willing to quit  Cocaine abuse Cocaine cessation education provided Patient is willing to quit Okay with Coreg per cardiology  Alcohol abuse CIWA protocol On FA/B1/MVI Watch for alcohol withdraw symptoms  Other Stroke Risk Factors   Other Active Problems Right arm pain Patient c/o right arm pain which prevented him from sleeping, acetaminophen partially effective No edema or deformity noted to right arm PRN tramodol ordered  Hospital day # 5   [name], MD PhD Stroke Neurology 09/13/2021 1:21 PM    To contact Stroke Continuity provider, please refer to [email or web address] After hours, contact General Neurology

## 2021-09-13 NOTE — TOC Progression Note (Signed)
Transition of Care [facility]) - Progression Note    Patient Details  Name: Craig Schmidt MRN: [id number] Date of Birth: February 19, 1971  Transition of Care [facility]) CM/SW [place], [place] Phone Number: 09/13/2021, 1:52 PM  Clinical Narrative:   CSW contacted financial counseling to ask about status of Medicaid/Disability workup. Financial counselor will come to see patient this afternoon. CSW to follow.    Expected Discharge Plan: [place] Barriers to Discharge: Inadequate or no insurance  Expected Discharge Plan and Services Expected Discharge Plan: [place] Choice: [place] arrangements for the past 2 months: Homeless                                       Social Determinants of Health (SDOH) Interventions    Readmission Risk Interventions No flowsheet data found.

## 2021-09-13 NOTE — Plan of Care (Signed)
?  Problem: Education: ?Goal: Knowledge of disease or condition will improve ?Outcome: Progressing ?Goal: Knowledge of secondary prevention will improve (SELECT ALL) ?Outcome: Progressing ?Goal: Knowledge of patient specific risk factors will improve (INDIVIDUALIZE FOR PATIENT) ?Outcome: Progressing ?  ?

## 2021-09-13 NOTE — Progress Notes (Signed)
ANTICOAGULATION CONSULT NOTE - Follow Up Consult  Pharmacy Consult for Warfarin Indication:  rheumatic valve stenosis; CVA  Allergies  Allergen Reactions   Peanut-Containing Drug Products Itching   Shellfish Allergy Swelling    Patient Measurements: Height: 5\' 5"  (165.1 cm) Weight: 80 kg (176 lb 5.9 oz) IBW/kg (Calculated) : 61.5  Vital Signs: Temp: 97.9 F (36.6 C) (02/22 0759) Temp Source: Oral (02/22 0759) BP: 118/78 (02/22 0759) Pulse Rate: 87 (02/22 0759)  Labs: Recent Labs    09/11/21 0259 09/12/21 0135 09/13/21 0421  HGB 10.6* 11.2* 12.8*  HCT 33.5* 34.1* 39.2  PLT 223 252 312  LABPROT 14.6 14.5 18.3*  INR 1.1 1.1 1.5*  CREATININE 1.94* 1.68* 1.48*     Estimated Creatinine Clearance: 58.8 mL/min (A) (by C-G formula based on SCr of 1.48 mg/dL (H)).  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 Pharmacy consulted to dose warfarin on 2/18. S/p TNK on 2/17 at ~9:30am. No thrombus on TEE. No hx atrial fibrillation. Noted plan 30-day cardiac even monitor as outpatient.   INR has now trended from 1.1>1.5 after Warfarin 5 mg x 2 days, then 7.5 mg x 2 days.  On Lovenox 40 mg SQ q24h and Aspirin 325 mg daily.    Goal of Therapy:  INR 2-3 Monitor platelets by anticoagulation protocol: Yes   Plan:  Warfarin 7.5 mg x 1 again today, though expect maintenance dose will be lower. Daily PT/INR Lovenox and Aspirin to stop when INR at goal.  Arty Baumgartner, RPh 09/13/2021,10:20 AM

## 2021-09-14 LAB — PROTIME-INR
INR: 1.8 — ABNORMAL HIGH (ref 0.8–1.2)
Prothrombin Time: 21.3 seconds — ABNORMAL HIGH (ref 11.4–15.2)

## 2021-09-14 MED ORDER — WARFARIN SODIUM 7.5 MG PO TABS
7.5000 mg | ORAL_TABLET | Freq: Once | ORAL | Status: AC
  Administered 2021-09-14: 7.5 mg via ORAL
  Filled 2021-09-14: qty 1

## 2021-09-14 NOTE — Progress Notes (Addendum)
STROKE TEAM PROGRESS NOTE   INTERVAL HISTORY Pt is sitting in chair, states he has been ambulating with PT.  He has been hemodynamically stable, and his neurological exam is stable.  Vitals:   09/14/21 0009 09/14/21 0420 09/14/21 0731 09/14/21 1103  BP: 123/69 105/72    Pulse: 99 94 89   Resp: 18 18    Temp: 97.7 F (36.5 C) (!) 97.5 F (36.4 C) 98.6 F (37 C) 98.4 F (36.9 C)  TempSrc: Oral Oral Oral Oral  SpO2: 95% 98% 98% 98%  Weight:      Height:       CBC:  Recent Labs  Lab 09/08/21 0915 09/08/21 0919 09/12/21 0135 09/13/21 0421  WBC 10.4   < > 8.9 9.4  NEUTROABS 7.6  --   --   --   HGB 12.2*   < > 11.2* 12.8*  HCT 38.9*   < > 34.1* 39.2  MCV 94.4   < > 89.7 89.3  PLT 242   < > 252 312   < > = values in this interval not displayed.    Basic Metabolic Panel:  Recent Labs  Lab 09/12/21 0135 09/13/21 0421  NA 133* 133*  K 4.1 4.2  CL 102 101  CO2 24 23  GLUCOSE 114* 136*  BUN 26* 23*  CREATININE 1.68* 1.48*  CALCIUM 8.5* 8.4*    Lipid Panel:  Recent Labs  Lab 09/09/21 0411  CHOL 110  TRIG 60   61  HDL 21*  CHOLHDL 5.2  VLDL 12  LDLCALC 77    HgbA1c:  Recent Labs  Lab 09/09/21 0411  HGBA1C 5.6    Urine Drug Screen:  Recent Labs  Lab 09/08/21 1247  LABOPIA NONE DETECTED  COCAINSCRNUR POSITIVE*  LABBENZ NONE DETECTED  AMPHETMU NONE DETECTED  THCU NONE DETECTED  LABBARB NONE DETECTED     Alcohol Level  Recent Labs  Lab 09/08/21 0915  ETH <10     IMAGING past 24 hours No results found.  PHYSICAL EXAM General:  Alert, well-developed, well-nourished patient in no acute distress   NEURO:  Mental Status: AA&Ox3  Speech/Language: speech is without dysarthria or aphasia.    Cranial Nerves:  II: PERRL. Visual fields full.  III, IV, VI: EOMI. Eyelids elevate symmetrically.  V: right sided facial numbness absent today VII: no facial droop seen today VIII: hearing intact to voice. IX, X:  Phonation is normal.  XII: tongue  is midline without fasciculations. Motor: 5/5 strength to LUE and LLE, 0/5 strength to RUE and 4/5 strength to RLE Tone: is normal and bulk is normal Sensation- right sided decreased sensation present Coordination: FTN intact on left Gait- deferred  ASSESSMENT/PLAN [name] is a 51 y.o. male with history of schwannoma, cocaine use and smoking presenting with sudden onset right sided weakness and slurred speech.  He was given TNK in the ED.  No LVO was seen on CTA head, so thrombectomy could not be performed.  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  Coumadin was started, and TEE is being performed today.  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 Generalized volume loss and left cerebellopontine angle vestibular schwannoma ASPECTS 10.    CTA head & neck negative for LVO MRI  patchy multifocal acute ischemic infarcts in left greater than right cerebral hemispheres and cerebellum, chronic  microvascular ischemic disease, 1.4 cm left vestibular schwannoma 2D Echo EF 25-30%, global left ventricular hypokinesis, grade 2 diastolic dysfunction, rheumatic mitral valve with moderate mitral stenosis, moderate to severe tricuspid regurgitation, dilated right and left atria, no atrial level shunt TEE showed no LV thrombus, LAA spontaneous contrast, mild MR and severe MS, questionable rheumatic etiology Cardiology recommend 30 day monitoring to rule out afib as outpt LDL 77 HgbA1c 5.6 VTE prophylaxis - lovenox No antithrombotic prior to admission, now on warfarin daily and ASA 325. Once INR at goal, will d/c ASA.  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 Once INR at goal, will d/c ASA Per card, if 30 day cardiac event monitoring found afib, he will need MAZE and LAA resection too.   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  Tobacco abuse Current smoker Smoking cessation counseling provided Nicotine patch provided Pt is willing to quit  Cocaine abuse Cocaine cessation education provided Patient is willing to quit Okay with Coreg per cardiology  Alcohol abuse CIWA protocol On FA/B1/MVI Watch for alcohol withdraw symptoms  Other Stroke Risk Factors   Other Active Problems Right arm pain Patient c/o right arm pain which prevented him from sleeping, acetaminophen partially effective No edema or deformity noted to right arm PRN tramodol and gabapentin ordered  Hospital day # [place] , MSN, AGACNP-BC [facility] See Amion for schedule and pager information 09/14/2021 12:33 PM   ATTENDING NOTE: I reviewed above note and agree with the assessment and plan. Pt was seen and examined.   No acute event overnight, neuro stable,  INR 1.8, continue aspirin and Coumadin at this time.  PT/OT.  Continue current management.  Pending placement.  For detailed assessment and plan, please refer to above as I have made changes wherever appropriate.   [name], MD PhD Stroke Neurology 09/14/2021 5:42 PM    To contact Stroke  Continuity provider, please refer to [email or web address] After hours, contact General Neurology

## 2021-09-14 NOTE — Progress Notes (Incomplete)
Heart Failure Stewardship Pharmacist Progress Note   PCP: Placey, Audrea Muscat, NP PCP-Cardiologist: None    HPI:  ***  Current HF Medications: {HF MEDS CURRENT:26665}  Prior to admission HF Medications: {HF MEDS PTA:26664}  Pertinent Lab Values: Serum creatinine ***, BUN ***, Potassium ***, Sodium ***, BNP ***, Magnesium ***, A1c ***, Digoxin ***   Vital Signs: Weight: *** lbs (admission weight: *** lbs) Blood pressure: ***  Heart rate: ***  I/O: -***L yesterday; net -***L ReDS reading: ***  Medication Assistance / Insurance Benefits Check: Does the patient have prescription insurance?  {Yes/No/Pending:24180} Type of insurance plan: ***  Does the patient qualify for medication assistance through manufacturers or grants?   {Yes/No/Pending:24180} Eligible grants and/or patient assistance programs: *** Medication assistance applications in progress: ***  Medication assistance applications approved: *** Approved medication assistance renewals will be completed by: ***  Outpatient Pharmacy:  Prior to admission outpatient pharmacy: *** Is the patient willing to use [facility] at discharge? {Yes/No/Pending:24180} Is the patient willing to transition their outpatient pharmacy to utilize a [facility] outpatient pharmacy?   {Yes/No/Pending:24180}    Assessment: 1. ***Acute on chronic systolic CHF (EF ***), due to ***. NYHA class *** symptoms. -    Plan: 1) Medication changes recommended at this time: -  2) Patient assistance: -  3)  Education  - To be completed prior to discharge  Kerby Nora, PharmD, BCPS Heart Failure Stewardship Pharmacist Phone [phone]

## 2021-09-14 NOTE — Progress Notes (Signed)
Heart Failure Nurse Navigator Progress Note  Patient screened for HF TOC appropriateness.  Appt scheduled in the HF [facility] on Tuesday March 7th.  Patient is agreeable.  He does tell me that he will need acess to transportation to get to appt.  It is uncertain at his time if he will transition to SNF for rehab after discharge.  He is not currently insured.  I will send a notification to ambulatory LCSW to follow up regarding transportation and possibly insurance.

## 2021-09-14 NOTE — Progress Notes (Signed)
Physical Therapy Treatment Patient Details Name: Craig Schmidt MRN: [id number] DOB: 1971-04-10 Today's Date: 09/14/2021   History of Present Illness Craig Schmidt is a 51 y.o. male presented to the emergency room for sudden onset of right-sided weakness. TNKase given. 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 PHMx:schwannoma, cocaine abuse which he says he has not used over the past few weeks.    PT Comments    Pt required min assist bed mobility, min assist sit to stand, and mod assist ambulation 5' with RW. Assist required to maintain grip with R hand on RW. Pt would benefit from trial with hemiwalker next session. Pt positioned in recliner with feet elevated. Pt is very motivated to participate in therapy and regain independence.    Recommendations for follow up therapy are one component of a multi-disciplinary discharge planning process, led by the attending physician.  Recommendations may be updated based on patient status, additional functional criteria and insurance authorization.  Follow Up Recommendations  Skilled nursing-short term rehab (<3 hours/day)     Assistance Recommended at Discharge Intermittent Supervision/Assistance  Patient can return home with the following A lot of help with walking and/or transfers;A lot of help with bathing/dressing/bathroom;Assistance with cooking/housework;Assistance with feeding;Assist for transportation;Help with stairs or ramp for entrance;Direct supervision/assist for financial management;Direct supervision/assist for medications management   Equipment Recommendations  Wheelchair (measurements PT);Wheelchair cushion (measurements PT);Hospital bed    Recommendations for Other Services       Precautions / Restrictions Precautions Precautions: Fall Restrictions Other  Position/Activity Restrictions: R UE is flaccid     Mobility  Bed Mobility Overal bed mobility: Needs Assistance Bed Mobility: Supine to Sit     Supine to sit: Min assist, HOB elevated     General bed mobility comments: increased time, +rail    Transfers Overall transfer level: Needs assistance Equipment used: Rolling walker (2 wheels) Transfers: Sit to/from Stand Sit to Stand: Min assist           General transfer comment: assist to power up and stabilize balance    Ambulation/Gait Ambulation/Gait assistance: Mod assist Gait Distance (Feet): 5 Feet Assistive device: Rolling walker (2 wheels) Gait Pattern/deviations: Decreased stride length, Step-to pattern, Decreased weight shift to right, Decreased stance time - right Gait velocity: decreased Gait velocity interpretation: <1.8 ft/sec, indicate of risk for recurrent falls   General Gait Details: Assist to maintain balance and grip with R hand on RW. Assist with RW management. Amb bed to recliner.   Stairs             Wheelchair Mobility    Modified Rankin (Stroke Patients Only) Modified Rankin (Stroke Patients Only) Pre-Morbid Rankin Score: No symptoms Modified Rankin: Moderately severe disability     Balance Overall balance assessment: Needs assistance Sitting-balance support: Feet supported, No upper extremity supported Sitting balance-Leahy Scale: Fair     Standing balance support: Single extremity supported, Bilateral upper extremity supported, During functional activity Standing balance-Leahy Scale: Poor                              Cognition Arousal/Alertness: Awake/alert Behavior During Therapy: WFL for tasks assessed/performed Overall Cognitive Status: No family/caregiver present to determine baseline cognitive functioning Area of Impairment: Awareness, Memory, Following commands, Safety/judgement, Problem solving  Following Commands: Follows  one step commands consistently Safety/Judgement: Decreased awareness of safety Awareness: Emergent Problem Solving: Decreased initiation, Difficulty sequencing, Requires verbal cues, Requires tactile cues          Exercises      General Comments        Pertinent Vitals/Pain Pain Assessment Pain Assessment: No/denies pain    Home Living                          Prior Function            PT Goals (current goals can now be found in the care plan section) Acute Rehab PT Goals Patient Stated Goal: to be able to use R arm Progress towards PT goals: Progressing toward goals    Frequency    Min 3X/week      PT Plan Current plan remains appropriate    Co-evaluation              AM-PAC PT "6 Clicks" Mobility   Outcome Measure  Help needed turning from your back to your side while in a flat bed without using bedrails?: A Little Help needed moving from lying on your back to sitting on the side of a flat bed without using bedrails?: A Lot Help needed moving to and from a bed to a chair (including a wheelchair)?: A Lot Help needed standing up from a chair using your arms (e.g., wheelchair or bedside chair)?: A Little Help needed to walk in hospital room?: A Lot Help needed climbing 3-5 steps with a railing? : Total 6 Click Score: 13    End of Session Equipment Utilized During Treatment: Gait belt Activity Tolerance: Patient tolerated treatment well Patient left: in chair;with call bell/phone within reach;with chair alarm set Nurse Communication: Mobility status PT Visit Diagnosis: Other abnormalities of gait and mobility (R26.89);Muscle weakness (generalized) (M62.81);Hemiplegia and hemiparesis Hemiplegia - Right/Left: Right Hemiplegia - dominant/non-dominant: Dominant Hemiplegia - caused by: Cerebral infarction     Time: 2993-7169 PT Time Calculation (min) (ACUTE ONLY): 24 min  Charges:  $Gait Training: 8-22 mins $Therapeutic Activity: 8-22  mins                     Lorrin Goodell, PT  Office # [phone] Pager [phone]    Lorriane Shire 09/14/2021, 9:22 AM

## 2021-09-14 NOTE — TOC CAGE-AID Note (Signed)
Transition of Care [facility]) - CAGE-AID Screening   Patient Details  Name: ALARIK RADU MRN: [id number] Date of Birth: [birth year]  [facility]) CM/SW Contact:    Madeline Bebout C Tarpley-Carter, [place] Phone Number: 09/14/2021, 2:29 PM   Clinical Narrative: Pt participated in [place].  Pt stated he does use substance and drinks ETOH socially.  Pt was offered resources, due to usage of substance.    Marque Rademaker Tarpley-Carter, MSW, LCSW-A Pronouns:  She/Her/Hers [place] Transitions of Care Clinical Social Worker Direct Number:  [phone] [email or web address]  CAGE-AID Screening: Substance Abuse Screening unable to be completed due to: : Patient unable to participate  Have You Ever Felt You Ought to Cut Down on Your Drinking or Drug Use?: No Have People Annoyed You By Critizing Your Drinking Or Drug Use?: No Have You Felt Bad Or Guilty About Your Drinking Or Drug Use?: No Have You Ever Had a Drink or Used Drugs First Thing In The Morning to Steady Your Nerves or to Get Rid of a Hangover?: No CAGE-AID Score: 0  Substance Abuse Education Offered: Yes  Substance abuse interventions: Scientist, clinical (histocompatibility and immunogenetics)

## 2021-09-14 NOTE — TOC Progression Note (Addendum)
Transition of Care [facility]) - Progression Note    Patient Details  Name: [name] MRN: [id number] Date of Birth: 26-Jul-1970  Transition of Care [facility]) CM/SW [place], [place] Phone Number: 09/14/2021, 11:24 AM  Clinical Narrative:     Per [facility] notation, pt was screened for medicaid/disability yesterday, 09/13/21. [facility] noted that they will submit medicaid application and referral to [facility](for disability application) today.   CSW spoke with [facility]; they would consider LOG  and will review pt. They would require a medicaid pending number if they were to accept him.   CSW resent referral in Hub.   CSW emailed [facility] to inquire about medicaid #.   1215: Received call from [place] at [facility]. She explained that pt does not have any income and before they could accept pt, they would need to have a disability application started and would require a "Rolling" LOG for payment to facility until pt's medicaid/disability kicks in. She stated that if Rolling LOG is approved and pt's disability application is started, they can accept pt.   CSW received pt's Medicaid ID number is [id number] O.   Expected Discharge Plan: [place] Barriers to Discharge: Inadequate or no insurance  Expected Discharge Plan and Services Expected Discharge Plan: [place] Choice: [place] arrangements for the past 2 months: Homeless                                       Social Determinants of Health (SDOH) Interventions    Readmission Risk Interventions No flowsheet data found.

## 2021-09-14 NOTE — Progress Notes (Signed)
ANTICOAGULATION CONSULT NOTE - Follow Up Consult  Pharmacy Consult for Warfarin Indication:  rheumatic valve stenosis; CVA  Allergies  Allergen Reactions   Peanut-Containing Drug Products Itching   Shellfish Allergy Swelling    Patient Measurements: Height: 5\' 5"  (165.1 cm) Weight: 80 kg (176 lb 5.9 oz) IBW/kg (Calculated) : 61.5  Vital Signs: Temp: 98.6 F (37 C) (02/23 0731) Temp Source: Oral (02/23 0731) BP: 105/72 (02/23 0420) Pulse Rate: 89 (02/23 0731)  Labs: Recent Labs    09/12/21 0135 09/13/21 0421 09/14/21 0216  HGB 11.2* 12.8*  --   HCT 34.1* 39.2  --   PLT 252 312  --   LABPROT 14.5 18.3* 21.3*  INR 1.1 1.5* 1.8*  CREATININE 1.68* 1.48*  --      Estimated Creatinine Clearance: 58.8 mL/min (A) (by C-G formula based on SCr of 1.48 mg/dL (H)).  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 Pharmacy consulted to dose warfarin on 2/18. S/p TNK on 2/17 at ~9:30am. No thrombus on TEE. No hx atrial fibrillation. Noted plan 30-day cardiac even monitor as outpatient.   INR has now trended from 1.1>1.5>1.8 after Warfarin 5 mg x 2 days, then 7.5 mg x 3 days.  On Lovenox 40 mg SQ q24h and Aspirin 325 mg daily.    Goal of Therapy:  INR 2-3 Monitor platelets by anticoagulation protocol: Yes   Plan:  Warfarin 7.5 mg x 1 again today, though expect maintenance dose will be lower. Daily PT/INR Lovenox and Aspirin to stop when INR at goal.  Arty Baumgartner, [place] 09/14/2021,11:12 AM

## 2021-09-15 DIAGNOSIS — F1010 Alcohol abuse, uncomplicated: Secondary | ICD-10-CM

## 2021-09-15 DIAGNOSIS — F17200 Nicotine dependence, unspecified, uncomplicated: Secondary | ICD-10-CM

## 2021-09-15 DIAGNOSIS — I255 Ischemic cardiomyopathy: Secondary | ICD-10-CM

## 2021-09-15 DIAGNOSIS — F1410 Cocaine abuse, uncomplicated: Secondary | ICD-10-CM

## 2021-09-15 LAB — PROTIME-INR
INR: 2 — ABNORMAL HIGH (ref 0.8–1.2)
Prothrombin Time: 23 seconds — ABNORMAL HIGH (ref 11.4–15.2)

## 2021-09-15 MED ORDER — WARFARIN SODIUM 5 MG PO TABS: 5.0000 mg | ORAL_TABLET | Freq: Once | ORAL | Status: DC

## 2021-09-15 MED ORDER — MELATONIN 5 MG PO TABS
5.0000 mg | ORAL_TABLET | Freq: Every day | ORAL | Status: DC
  Administered 2021-09-15 – 2021-09-30 (×16): 5 mg via ORAL
  Filled 2021-09-15 (×16): qty 1

## 2021-09-15 MED ORDER — WARFARIN SODIUM 7.5 MG PO TABS
7.5000 mg | ORAL_TABLET | Freq: Once | ORAL | Status: AC
  Administered 2021-09-15: 7.5 mg via ORAL
  Filled 2021-09-15: qty 1

## 2021-09-15 NOTE — Progress Notes (Signed)
Heart Failure Stewardship Pharmacist Progress Note   PCP: Placey, Audrea Muscat, NP PCP-Cardiologist: None    HPI:  51 yo M with PMH of vestibular schwannoma, asthma, alcohol and cocaine use. He presented to the ED on 2/17 as a code stroke. Received TNK in ED. CXR with pulmonary vascular congestion and mild pulmonary edema. ECHO done on 2/17 with LVEF 25-30%, G2DD, and mildly reduced RV as well as severe mitral stenosis, mild-moderate mitral regurgitation, and moderate-severe tricuspid regurgitation. Suspected rheumatic valve disease. TEE on 2/20 with mild tricuspid regurgitation and findings concerning for rheumatic heart disease but no extensive chordal thickening.   Current HF Medications: Beta Blocker: carvedilol 12.5 mg BID ACE/ARB/ARNI: Entresto 24/26 mg BID SGLT2i: Jaridance 10 mg daily Other: BiDil 1 tab TID  Prior to admission HF Medications: None  Pertinent Lab Values: Serum creatinine 1.48, BUN 23, Potassium 4.2, Sodium 133, BNP 1570, A1c 5.6   Vital Signs: Weight: 176 lbs (admission weight: 176 lbs) Blood pressure: 90-110/60-80s  Heart rate: 90-100s  I/O: -2L yesterday; net -8.6L  Medication Assistance / Insurance Benefits Check: Does the patient have prescription insurance?  No  Does the patient qualify for medication assistance through manufacturers or grants?   Pending Eligible grants and/or patient assistance programs: Entresto/Jardiance if meets income requirements Medication assistance applications in progress: none  Medication assistance applications approved: none Approved medication assistance renewals will be completed by: TBD  Outpatient Pharmacy:  Prior to admission outpatient pharmacy: [facility] Is the patient willing to use [place] pharmacy at discharge? Yes Is the patient willing to transition their outpatient pharmacy to utilize a [facility] outpatient pharmacy?   Pending    Assessment: 1. Acute systolic CHF (EF 40-37%), due to presumed NICM in the  setting of uncontrolled HTN and cocaine/alcohol use. NYHA class II symptoms. - Continue carvedilol 12.5 mg BID - Continue Entresto 24/26 mg BID - Consider adding spironolactone prior to discharge if BP allows - Continue Jardiance 10 mg daily - Continue BiDil 1 tab TID - LHC planned as outpatient but may be difficult as pt is uninsured. Will discuss with team if LHC should be done before discharge to avoid this potential barrier.   Plan: 1) Medication changes recommended at this time: - None at this time  2) Patient assistance: - Uninsured - HF TOC appt scheduled and will help with pt assistance at that time. Medicaid pending  3)  Education  - To be completed prior to discharge  Kerby Nora, PharmD, BCPS Heart Failure Stewardship Pharmacist Phone [phone]

## 2021-09-15 NOTE — Progress Notes (Signed)
PT Cancellation Note  Patient Details Name: Craig Schmidt MRN: [id number] DOB: Sep 01, 1970   Cancelled Treatment:    Reason Eval/Treat Not Completed: Pain limiting ability to participate. Pt with c/o severe BLE pain. Primarily burning pain in his feet. Declining mobility attempts. PT to re-attempt as time allows.   Lorriane Shire 09/15/2021, 10:53 AM  Lorrin Goodell, PT  Office # [phone] Pager [phone]

## 2021-09-15 NOTE — Progress Notes (Signed)
Called by RN for right foot pain. Started last night. No relief with tramadol that was given at 0450. Has tylenol PRN ordered. Recommended giving one dose of tylenol and reassess. -- Amie Portland, MD Neurologist [facility] Pager: [phone]

## 2021-09-15 NOTE — Discharge Instructions (Signed)
Information on my medicine - Coumadin   (Warfarin)  This medication education was reviewed with me or my healthcare representative as part of my discharge preparation.   Why was Coumadin prescribed for you? Coumadin was prescribed for you because you have a blood clot or a medical condition that can cause an increased risk of forming blood clots. Blood clots can cause serious health problems by blocking the flow of blood to the heart, lung, or brain. Coumadin can prevent harmful blood clots from forming. As a reminder your indication for Coumadin is:  Stroke and clot prevention for rheumatic mitral valve stenosis (heart valve issue)  What test will check on my response to Coumadin? While on Coumadin (warfarin) you will need to have an INR test regularly to ensure that your dose is keeping you in the desired range. The INR (international normalized ratio) number is calculated from the result of the laboratory test called prothrombin time (PT).  If an INR APPOINTMENT HAS NOT ALREADY BEEN MADE FOR YOU please schedule an appointment to have this lab work done by your health care provider within 7 days. Your INR goal is usually a number between:  2 to 3 or your provider may give you a more narrow range like 2-2.5.  Ask your health care provider during an office visit what your goal INR is.  What  do you need to  know  About  COUMADIN? Take Coumadin (warfarin) exactly as prescribed by your healthcare provider about the same time each day.  DO NOT stop taking without talking to the doctor who prescribed the medication.  Stopping without other blood clot prevention medication to take the place of Coumadin may increase your risk of developing a new clot or stroke.  Get refills before you run out.  What do you do if you miss a dose? If you miss a dose, take it as soon as you remember on the same day then continue your regularly scheduled regimen the next day.  Do not take two doses of Coumadin at the same  time.  Important Safety Information A possible side effect of Coumadin (Warfarin) is an increased risk of bleeding. You should call your healthcare provider right away if you experience any of the following: Bleeding from an injury or your nose that does not stop. Unusual colored urine (red or dark brown) or unusual colored stools (red or black). Unusual bruising for unknown reasons. A serious fall or if you hit your head (even if there is no bleeding).  Some foods or medicines interact with Coumadin (warfarin) and might alter your response to warfarin. To help avoid this: Eat a balanced diet, maintaining a consistent amount of Vitamin K. Notify your provider about major diet changes you plan to make. Avoid alcohol or limit your intake to 1 drink for women and 2 drinks for men per day. (1 drink is 5 oz. wine, 12 oz. beer, or 1.5 oz. liquor.)  Make sure that ANY health care provider who prescribes medication for you knows that you are taking Coumadin (warfarin).  Also make sure the healthcare provider who is monitoring your Coumadin knows when you have started a new medication including herbals and non-prescription products.  Coumadin (Warfarin)  Major Drug Interactions  Increased Warfarin Effect Decreased Warfarin Effect  Alcohol (large quantities) Antibiotics (esp. Septra/Bactrim, Flagyl, Cipro) Amiodarone (Cordarone) Aspirin (ASA) Cimetidine (Tagamet) Megestrol (Megace) NSAIDs (ibuprofen, naproxen, etc.) Piroxicam (Feldene) Propafenone (Rythmol SR) Propranolol (Inderal) Isoniazid (INH) Posaconazole (Noxafil) Barbiturates (Phenobarbital) Carbamazepine (Tegretol) Chlordiazepoxide (  Librium) Cholestyramine (Questran) Griseofulvin Oral Contraceptives Rifampin Sucralfate (Carafate) Vitamin K   Coumadin (Warfarin) Major Herbal Interactions  Increased Warfarin Effect Decreased Warfarin Effect  Garlic Ginseng Ginkgo biloba Coenzyme Q10 Green tea St. Johns wort     Coumadin (Warfarin) FOOD Interactions  Eat a consistent number of servings per week of foods HIGH in Vitamin K (1 serving =  cup)  Collards (cooked, or boiled & drained) Kale (cooked, or boiled & drained) Mustard greens (cooked, or boiled & drained) Parsley *serving size only =  cup Spinach (cooked, or boiled & drained) Swiss chard (cooked, or boiled & drained) Turnip greens (cooked, or boiled & drained)  Eat a consistent number of servings per week of foods MEDIUM-HIGH in Vitamin K (1 serving = 1 cup)  Asparagus (cooked, or boiled & drained) Broccoli (cooked, boiled & drained, or raw & chopped) Brussel sprouts (cooked, or boiled & drained) *serving size only =  cup Lettuce, raw (green leaf, endive, romaine) Spinach, raw Turnip greens, raw & chopped   These websites have more information on Coumadin (warfarin):  FailFactory.se; VeganReport.com.au;

## 2021-09-15 NOTE — Progress Notes (Signed)
STROKE TEAM PROGRESS NOTE   INTERVAL HISTORY Pt is sitting in chair, with both feet in the water bath. He stated that last night his both feet painful at middle of night and not able to get back to sleep. He was given tylenol and tramodol but not working, this morning it is better with water bath.   Vitals:   09/15/21 0411 09/15/21 0753 09/15/21 1136 09/15/21 1504  BP: 111/83 (!) 120/91 90/61 101/75  Pulse: 92 94 96 87  Resp: 14 16 16 16   Temp: 98 F (36.7 C) (!) 97.5 F (36.4 C)  (!) 97.4 F (36.3 C)  TempSrc: Oral Oral  Oral  SpO2: 100% 100% 94%   Weight:      Height:       CBC:  Recent Labs  Lab 09/12/21 0135 09/13/21 0421  WBC 8.9 9.4  HGB 11.2* 12.8*  HCT 34.1* 39.2  MCV 89.7 89.3  PLT 252 025   Basic Metabolic Panel:  Recent Labs  Lab 09/12/21 0135 09/13/21 0421  NA 133* 133*  K 4.1 4.2  CL 102 101  CO2 24 23  GLUCOSE 114* 136*  BUN 26* 23*  CREATININE 1.68* 1.48*  CALCIUM 8.5* 8.4*   Lipid Panel:  Recent Labs  Lab 09/09/21 0411  CHOL 110  TRIG 60   61  HDL 21*  CHOLHDL 5.2  VLDL 12  LDLCALC 77   HgbA1c:  Recent Labs  Lab 09/09/21 0411  HGBA1C 5.6   Urine Drug Screen:  No results for input(s): LABOPIA, COCAINSCRNUR, LABBENZ, AMPHETMU, THCU, LABBARB in the last 168 hours.  Alcohol Level  No results for input(s): ETH in the last 168 hours.  IMAGING past 24 hours No results found.  PHYSICAL EXAM General:  Alert, well-developed, well-nourished patient in no acute distress   NEURO:  Mental Status: AA&Ox3  Speech/Language: speech is without dysarthria or aphasia.    Cranial Nerves:  II: PERRL. Visual fields full.  III, IV, VI: EOMI. Eyelids elevate symmetrically.  V: right sided facial numbness absent today VII: no facial droop seen today VIII: hearing intact to voice. IX, X:  Phonation is normal.  XII: tongue is midline without fasciculations. Motor: 5/5 strength to LUE and LLE, 0/5 strength to RUE and 4/5 strength to RLE Tone: is  normal and bulk is normal Sensation- right sided decreased sensation present Coordination: FTN intact on left Gait- deferred  ASSESSMENT/PLAN [name] is a 51 y.o. male with history of schwannoma, cocaine use and smoking presenting with sudden onset right sided weakness and slurred speech.  He was given TNK in the ED.  No LVO was seen on CTA head, so thrombectomy could not be performed.  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  Coumadin was started, and TEE is being performed today.  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 Generalized volume loss and left cerebellopontine angle vestibular schwannoma ASPECTS 10.    CTA head & neck negative for LVO MRI  patchy multifocal acute ischemic infarcts in left greater than right cerebral hemispheres and cerebellum, chronic microvascular ischemic disease, 1.4 cm left vestibular schwannoma 2D Echo EF 25-30%, global left ventricular hypokinesis, grade 2 diastolic dysfunction, rheumatic mitral valve with moderate mitral stenosis, moderate to severe tricuspid regurgitation, dilated right and left atria, no atrial level shunt TEE showed no LV thrombus, LAA spontaneous contrast,  mild MR and severe MS, questionable rheumatic etiology Cardiology recommend 30 day monitoring to rule out afib as outpt LDL 77 HgbA1c 5.6 VTE prophylaxis - lovenox No antithrombotic prior to admission, now on warfarin daily and INR 2.0. will d/c ASA.  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  Tobacco abuse Current smoker Smoking cessation counseling provided Nicotine patch provided Pt is willing to quit  Cocaine abuse Cocaine cessation education provided Patient is willing to quit Okay with Coreg per cardiology  Alcohol abuse CIWA protocol On FA/B1/MVI Watch for alcohol withdraw symptoms  Other Stroke Risk Factors   Other Active Problems Right arm and b/l feet pain Patient c/o right arm and b/l feet pain which prevented him from sleeping, acetaminophen partially effective No edema or deformity noted to right arm and b/l feet Concerning for neuropathy from alcohol use PRN tramodol and tylenol Scheduled gabapentin 300 Qhs ordered Melatonin 3mg  -> 5mg    Hospital day # 7    Craig Hawking, MD PhD Stroke Neurology 09/15/2021 5:50 PM    To contact Stroke Continuity provider, please refer to [email or web address] After hours, contact General Neurology

## 2021-09-15 NOTE — Progress Notes (Signed)
ANTICOAGULATION CONSULT NOTE - Follow Up Consult  Pharmacy Consult for Warfarin Indication:  rheumatic valve stenosis; CVA  Allergies  Allergen Reactions   Peanut-Containing Drug Products Itching   Shellfish Allergy Swelling    Patient Measurements: Height: 5\' 5"  (165.1 cm) Weight: 80 kg (176 lb 5.9 oz) IBW/kg (Calculated) : 61.5  Vital Signs: Temp: 97.5 F (36.4 C) (02/24 0753) Temp Source: Oral (02/24 0753) BP: 90/61 (02/24 1136) Pulse Rate: 96 (02/24 1136)  Labs: Recent Labs    09/13/21 0421 09/14/21 0216 09/15/21 0402  HGB 12.8*  --   --   HCT 39.2  --   --   PLT 312  --   --   LABPROT 18.3* 21.3* 23.0*  INR 1.5* 1.8* 2.0*  CREATININE 1.48*  --   --      Estimated Creatinine Clearance: 58.8 mL/min (A) (by C-G formula based on SCr of 1.48 mg/dL (H)).  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 Pharmacy consulted to dose warfarin on 2/18. S/p TNK on 2/17 at ~9:30am. No thrombus on TEE. No hx atrial fibrillation. Noted plan 30-day cardiac even monitor as outpatient.   INR has now trended up to 2.0 after Warfarin 5 mg x 2 days, then 7.5 mg x 4 days.  On Lovenox 40 mg SQ q24h and Aspirin 325 mg daily.    Goal of Therapy:  INR 2-3 Monitor platelets by anticoagulation protocol: Yes   Plan:  Warfarin 7.5 mg x 1 more day, to try to keep INR at goal. Anticipate lower dose for maintenance. Daily PT/INR Lovenox and Aspirin to stop when INR at goal. INR right at 2.0 today. Will stop Lovenox and Aspirin.  Arty Baumgartner, RPh 09/15/2021,12:38 PM

## 2021-09-15 NOTE — Progress Notes (Signed)
Occupational Therapy Treatment Patient Details Name: Craig Schmidt MRN: [id number] DOB: [birth year] Today's Date: 09/15/2021   History of present illness 51 y.o. male presented to the emergency room for sudden onset of right-sided weakness. TNKase given. 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 PHMx:schwannoma, cocaine abuse which he says he has not used over the past few weeks.   OT comments  Pt progressing towards established OT goals. Focused session on RUE exercises. Pt reporting new movement at RUE and demonstrated shoulder extension to bring arm back towards his body. Pt participating in shoulder extension exercises, shoulder shrugs, scapular retraction, elbow flexion, and lateral leaning for weight bearing while seated at EOB. Continue to recommend dc to post-acute rehab and will continue to follow acutely as admitted.    Recommendations for follow up therapy are one component of a multi-disciplinary discharge planning process, led by the attending physician.  Recommendations may be updated based on patient status, additional functional criteria and insurance authorization.    Follow Up Recommendations  Skilled nursing-short term rehab (<3 hours/day)    Assistance Recommended at Discharge Frequent or constant Supervision/Assistance  Patient can return home with the following  Two people to help with walking and/or transfers;Two people to help with bathing/dressing/bathroom;Assist for transportation;Assistance with cooking/housework;Direct supervision/assist for financial management;Direct supervision/assist for medications management   Equipment Recommendations  Other (comment) (TBD)    Recommendations for Other Services      Precautions / Restrictions Precautions Precautions: Fall Restrictions Weight Bearing Restrictions: No        Mobility Bed Mobility Overal bed mobility: Needs Assistance Bed Mobility: Supine to Sit, Sit to Supine     Supine to sit: HOB elevated, Min guard Sit to supine: Min guard   General bed mobility comments: Increased time and use of rail to pull towards EOB    Transfers Overall transfer level: Needs assistance Equipment used: Rolling walker (2 wheels) Transfers: Sit to/from Stand Sit to Stand: Min assist           General transfer comment: Min A for gaining balance in standing     Balance Overall balance assessment: Needs assistance Sitting-balance support: Feet supported, No upper extremity supported Sitting balance-Leahy Scale: Fair     Standing balance support: Single extremity supported, Bilateral upper extremity supported, During functional activity Standing balance-Leahy Scale: Poor                             ADL either performed or assessed with clinical judgement   ADL Overall ADL's : Needs assistance/impaired   Eating/Feeding Details (indicate cue type and reason): Reporting set up to eat his ice cream at end of session. Unabel to incorporate RUE                                 Functional mobility during ADLs: Moderate assistance (side steps towards HOB) General ADL Comments: Pt participating in RUE exercises at EOB and then side steps towards [facility]    Extremity/Trunk Assessment Upper Extremity Assessment Upper Extremity Assessment: RUE deficits/detail RUE Deficits / Details: Able to perform scapular retraction, shoulder extension, and elbow flexion (with gravity eleminated). Unable to shrugg shoudler. No sensation at hand and decreased sensation at vetral forearm. Sensation returning at midforearm RUE Sensation: decreased light touch RUE Coordination: decreased fine  motor;decreased gross motor   Lower Extremity Assessment Lower Extremity Assessment: Defer to PT evaluation RLE Deficits / Details: 2-/5 DF/PF, 3/5 knee  extension, 3-/5 hip flexion, 0/5 knee flexion noted RLE Sensation: decreased light touch        Vision       Perception     Praxis      Cognition Arousal/Alertness: Awake/alert Behavior During Therapy: WFL for tasks assessed/performed Overall Cognitive Status: Impaired/Different from baseline Area of Impairment: Awareness, Safety/judgement, Problem solving                         Safety/Judgement: Decreased awareness of safety Awareness: Emergent Problem Solving: Decreased initiation, Difficulty sequencing, Requires verbal cues, Requires tactile cues General Comments: Poor attention to RUE. Requiring increased time and cues for problem solving. Asking good questions about stroke recovery. Benefits from increased time for processing. Askign insightful questions like "if this hand doesnt work in the future (pointing to RUE) will you teach me how to write with my left one?"        Exercises Exercises: Other exercises Other Exercises Other Exercises: shoulder shrugs; seated; x15; Max assistance to facilitate R side Other Exercises: Scapular retraction; seated; x15; minimal assistance to facilitate R side Other Exercises: Shoulder extension; seated; x15; assistance for gravity elemination with elbow at 90* on bed Other Exercises: Elbow flexion; seated; x15; RUE placed on table; assistance to place arm in neutral position with cues for relaxation Other Exercises: lateral leaning into R elbow x5; upgrading to have pt reach with LUE to L for small cups, then lean onto right elbow; then reach across body and stack cups on R side. x15    Shoulder Instructions       General Comments VSS    Pertinent Vitals/ Pain       Pain Assessment Pain Assessment: Faces Pain Location: right arm when moved/positioned Pain Descriptors / Indicators: Aching, Grimacing Pain Intervention(s): Monitored during session, Limited activity within patient's tolerance, Repositioned  Home Living                                           Prior Functioning/Environment              Frequency  Min 2X/week        Progress Toward Goals  OT Goals(current goals can now be found in the care plan section)  Progress towards OT goals: Progressing toward goals  Acute Rehab OT Goals OT Goal Formulation: With patient Time For Goal Achievement: 09/23/21 Potential to Achieve Goals: Good ADL Goals Pt Will Perform Grooming: with set-up;with supervision;sitting Pt Will Perform Upper Body Bathing: with min assist;sitting Pt Will Perform Lower Body Bathing: with min assist;sit to/from stand Pt Will Transfer to Toilet: with min assist;with transfer board;bedside commode Pt/caregiver will Perform Home Exercise Program: Increased ROM;Right Upper extremity;With written HEP provided Additional ADL Goal #1: Pt will be S in and OOB for basic ADLs Additional ADL Goal #2: Continue to assess vision  Plan Discharge plan remains appropriate    Co-evaluation                 AM-PAC OT "6 Clicks" Daily Activity     Outcome Measure   Help from another person eating meals?: A Little Help from another person taking care of personal grooming?: A Lot Help from another person toileting, which includes  using toliet, bedpan, or urinal?: Total Help from another person bathing (including washing, rinsing, drying)?: A Lot Help from another person to put on and taking off regular upper body clothing?: Total Help from another person to put on and taking off regular lower body clothing?: Total 6 Click Score: 10    End of Session    OT Visit Diagnosis: Other abnormalities of gait and mobility (R26.89);Muscle weakness (generalized) (M62.81);Other symptoms and signs involving cognitive function;Hemiplegia and hemiparesis;Pain Hemiplegia - Right/Left: Right Hemiplegia - dominant/non-dominant: Dominant Pain - Right/Left: Right Pain - part of body: Hand;Arm   Activity Tolerance  Patient tolerated treatment well   Patient Left in bed;with bed alarm set;with call bell/phone within reach   Nurse Communication Mobility status        Time: 8469-6295 OT Time Calculation (min): 42 min  Charges: OT General Charges $OT Visit: 1 Visit OT Treatments $Therapeutic Activity: 38-52 mins  Kollin Udell MSOT, OTR/L Acute Rehab Pager: [phone] Office: [place] 09/15/2021, 6:05 PM

## 2021-09-15 NOTE — TOC Progression Note (Addendum)
Transition of Care [facility]) - Progression Note    Patient Details  Name: STEPHONE GUM MRN: [id number] Date of Birth: 1970/10/19  Transition of Care [facility]) CM/SW [place], [place] Phone Number: 09/15/2021, 2:31 PM  Clinical Narrative:   CSW attempting to reach [place] at [facility] to discuss LOG placement. CSW spoke with Shirlee Limerick who provided [facility] number. CSW contacted Claiborne Billings, left a voicemail, awaiting call back. CSW to follow.  ADDENDUM 2:59 PM: CSW spoke with Claiborne Billings, and also TOC Leadership, to discuss request for rolling LOG. After discussion, [place] is not willing to take LOG at this time. No other bed offers. CSW to follow.    Expected Discharge Plan: [place] Barriers to Discharge: Inadequate or no insurance  Expected Discharge Plan and Services Expected Discharge Plan: [place]: [place] arrangements for the past 2 months: Homeless                                       Social Determinants of Health (SDOH) Interventions    Readmission Risk Interventions No flowsheet data found.

## 2021-09-16 ENCOUNTER — Inpatient Hospital Stay (HOSPITAL_COMMUNITY): Payer: Medicaid Other

## 2021-09-16 DIAGNOSIS — M79673 Pain in unspecified foot: Secondary | ICD-10-CM

## 2021-09-16 LAB — PROTIME-INR
INR: 2.4 — ABNORMAL HIGH (ref 0.8–1.2)
Prothrombin Time: 26.3 seconds — ABNORMAL HIGH (ref 11.4–15.2)

## 2021-09-16 MED ORDER — STROKE: EARLY STAGES OF RECOVERY BOOK
Status: AC
  Filled 2021-09-16: qty 1

## 2021-09-16 MED ORDER — COUMADIN BOOK
Freq: Once | Status: AC
  Administered 2021-09-16: 1
  Filled 2021-09-16: qty 1

## 2021-09-16 MED ORDER — WARFARIN SODIUM 5 MG PO TABS
5.0000 mg | ORAL_TABLET | Freq: Once | ORAL | Status: AC
  Administered 2021-09-16: 5 mg via ORAL
  Filled 2021-09-16: qty 1

## 2021-09-16 MED ORDER — GABAPENTIN 600 MG PO TABS
300.0000 mg | ORAL_TABLET | Freq: Two times a day (BID) | ORAL | Status: DC
  Administered 2021-09-16 – 2021-09-21 (×11): 300 mg via ORAL
  Filled 2021-09-16 (×11): qty 1

## 2021-09-16 MED ORDER — STROKE: EARLY STAGES OF RECOVERY BOOK
Freq: Once | Status: AC
  Filled 2021-09-16: qty 1

## 2021-09-16 NOTE — Progress Notes (Signed)
VASCULAR LAB    Bilateral lower extremity venous duplex has been performed.  See CV proc for preliminary results.   Victor Langenbach, RVT 09/16/2021, 2:28 PM

## 2021-09-16 NOTE — Progress Notes (Signed)
ANTICOAGULATION CONSULT NOTE - Follow Up Consult  Pharmacy Consult for Warfarin Indication:  CVA, rheumatic valve stenosis  Allergies  Allergen Reactions   Peanut-Containing Drug Products Itching   Shellfish Allergy Swelling    Patient Measurements: Height: 5\' 5"  (165.1 cm) Weight: 80 kg (176 lb 5.9 oz) IBW/kg (Calculated) : 61.5 kg  Vital Signs: Temp: 97.4 F (36.3 C) (02/25 0810) Temp Source: Oral (02/25 0810) BP: 110/76 (02/25 0810) Pulse Rate: 96 (02/25 0810)  Labs: Recent Labs    09/14/21 0216 09/15/21 0402 09/16/21 0216  LABPROT 21.3* 23.0* 26.3*  INR 1.8* 2.0* 2.4*    Estimated Creatinine Clearance: 58.8 mL/min (A) (by C-G formula based on SCr of 1.48 mg/dL (H)).   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 Tenecteplase given on 2/17 at [place]. TEE is negative for thrombus and cardiology recommends 30-day monitoring to rule out atrial fibrillation. PTA the patient is not on anticoagulation. Pharmacy is consulted to dose warfarin.  INR is therapeutic at 2.4 today. There are no signs or symptoms of bleeding noted. Last Hgb 12.8, platelets 312. No new interacting medications have been initiated and the patient's dietary intake remains stable.  Goal of Therapy:  INR 2-3 Monitor platelets by anticoagulation protocol: Yes   Plan:  Give warfarin PO 5 mg x 1 dose tonight Obtain a daily PT/INR and Q72H CBC Monitor for signs and symptoms of bleeding Watch for drug-drug interactions and significant diet changes  Shauna Hugh, PharmD, [place]  PGY-2 Pharmacy Resident 09/16/2021 9:03 AM  Please check [email or web address] for unit-specific pharmacy phone numbers.

## 2021-09-16 NOTE — Progress Notes (Signed)
STROKE TEAM PROGRESS NOTE   INTERVAL HISTORY Laying in bed. NAD. No events overnight.  Still with feet pain worse on the right. He thinks it is more swollen.   Vitals:   09/15/21 2022 09/15/21 2344 09/16/21 0354 09/16/21 0810  BP: 100/64 99/60 99/69  110/76  Pulse: 93 98 77 96  Resp: 17 14 20 16   Temp: (!) 97.4 F (36.3 C) (!) 97.5 F (36.4 C) 97.8 F (36.6 C) (!) 97.4 F (36.3 C)  TempSrc: Oral Oral Oral Oral  SpO2: 100% 96% 100% 100%  Weight:      Height:       CBC:  Recent Labs  Lab 09/12/21 0135 09/13/21 0421  WBC 8.9 9.4  HGB 11.2* 12.8*  HCT 34.1* 39.2  MCV 89.7 89.3  PLT 252 456    Basic Metabolic Panel:  Recent Labs  Lab 09/12/21 0135 09/13/21 0421  NA 133* 133*  K 4.1 4.2  CL 102 101  CO2 24 23  GLUCOSE 114* 136*  BUN 26* 23*  CREATININE 1.68* 1.48*  CALCIUM 8.5* 8.4*    Lipid Panel:  No results for input(s): CHOL, TRIG, HDL, CHOLHDL, VLDL, LDLCALC in the last 168 hours.  HgbA1c:  No results for input(s): HGBA1C in the last 168 hours.  Urine Drug Screen:  No results for input(s): LABOPIA, COCAINSCRNUR, LABBENZ, AMPHETMU, THCU, LABBARB in the last 168 hours.  Alcohol Level  No results for input(s): ETH in the last 168 hours.  IMAGING past 24 hours No results found.  PHYSICAL EXAM General:  Alert, well-developed, well-nourished patient in no acute distress   NEURO:  Mental Status: AA&Ox3  Speech/Language: speech is without dysarthria or aphasia.    Cranial Nerves:  II: PERRL. Visual fields full.  III, IV, VI: EOMI. Eyelids elevate symmetrically.  V: normal sensation. VII: mild right facial droop on activation. VIII: hearing intact to voice. IX, X:  Phonation is normal.  XII: tongue is midline without fasciculations. Motor: 5/5 strength to LUE and LLE, 0/5 strength to RUE and 4/5 strength to RLE Tone: is normal and bulk is normal Sensation- right sided decreased sensation present Coordination: FTN intact on left Gait-  deferred  Ext: pain on palpation of R>L calf. Right foot swelling noted but mild. No evidence of infection.  ASSESSMENT/PLAN [name] is a 51 y.o. male with history of schwannoma, cocaine use and smoking presenting with sudden onset right sided weakness and slurred speech.  He was given TNK in the ED.  No LVO was seen on CTA head, so thrombectomy could not be performed.  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  Coumadin was started, and TEE completed.   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 Generalized volume loss and left cerebellopontine angle vestibular schwannoma ASPECTS 10.    CTA head & neck negative for LVO MRI  patchy multifocal acute ischemic infarcts in left greater than right cerebral hemispheres and cerebellum, chronic microvascular ischemic disease, 1.4 cm left vestibular schwannoma 2D Echo EF 25-30%, global left ventricular hypokinesis, grade 2 diastolic dysfunction, rheumatic mitral valve with moderate mitral stenosis, moderate to severe tricuspid regurgitation, dilated right and left atria, no atrial level shunt TEE showed no LV thrombus, LAA spontaneous contrast, mild MR and severe MS, questionable rheumatic etiology Cardiology recommend 30 day monitoring to rule out afib as outpt LDL 77 HgbA1c 5.6  VTE prophylaxis - lovenox No antithrombotic prior to admission, now on warfarin daily and INR 2.4. 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  Tobacco abuse Current smoker Smoking cessation counseling provided Nicotine patch provided Pt is willing to quit  Cocaine abuse Cocaine cessation education provided Patient is willing to quit Okay with Coreg per cardiology  Alcohol abuse On FA/B1/MVI Watch for alcohol withdraw symptoms HD #8  Other Stroke Risk Factors Drug abuse  Other Active Problems  b/l feet/leg pain b/l feet pain which prevented him from sleeping, acetaminophen partially effective, started on gabapentin 300 mg yesterday. No edema or deformity noted to right arm and b/l feet Concerning for neuropathy from alcohol use or possible gout PRN tramodol and tylenol Increase gabapentin 300 BID. Melatonin 3mg  -> 5mg   Check LE u/s to eval for DVT.  Hospital day # 8  Monitoring INR. Check u/s LE to r/u DVT. Increase gabapentin 300mg  BID   Total of 35 mins spent reviewing chart, discussion with patient and family on prognosis, Dx and plan. Discussed case with patient's nurse. Reviewed Imaging personally.    To contact Stroke Continuity provider, please refer to [email or web address] After hours, contact General Neurology

## 2021-09-17 LAB — CBC
HCT: 35.1 % — ABNORMAL LOW (ref 39.0–52.0)
Hemoglobin: 11.3 g/dL — ABNORMAL LOW (ref 13.0–17.0)
MCH: 28.9 pg (ref 26.0–34.0)
MCHC: 32.2 g/dL (ref 30.0–36.0)
MCV: 89.8 fL (ref 80.0–100.0)
Platelets: 382 10*3/uL (ref 150–400)
RBC: 3.91 MIL/uL — ABNORMAL LOW (ref 4.22–5.81)
RDW: 14.2 % (ref 11.5–15.5)
WBC: 7.6 10*3/uL (ref 4.0–10.5)
nRBC: 0 % (ref 0.0–0.2)

## 2021-09-17 LAB — PROTIME-INR
INR: 2.3 — ABNORMAL HIGH (ref 0.8–1.2)
Prothrombin Time: 25.3 seconds — ABNORMAL HIGH (ref 11.4–15.2)

## 2021-09-17 MED ORDER — WARFARIN SODIUM 7.5 MG PO TABS
7.5000 mg | ORAL_TABLET | Freq: Once | ORAL | Status: AC
  Administered 2021-09-17: 7.5 mg via ORAL
  Filled 2021-09-17: qty 1

## 2021-09-17 NOTE — Progress Notes (Signed)
ANTICOAGULATION CONSULT NOTE - Follow Up Consult  Pharmacy Consult for Warfarin Indication:  CVA, rheumatic valve stenosis  Allergies  Allergen Reactions   Peanut-Containing Drug Products Itching   Shellfish Allergy Swelling    Patient Measurements: Height: 5\' 5"  (165.1 cm) Weight: 80 kg (176 lb 5.9 oz) IBW/kg (Calculated) : 61.5 kg  Vital Signs: Temp: 97.7 F (36.5 C) (02/26 0735) Temp Source: Oral (02/26 0735) BP: 105/75 (02/26 0501) Pulse Rate: 100 (02/26 0735)  Labs: Recent Labs    09/15/21 0402 09/16/21 0216 09/17/21 0134  HGB  --   --  11.3*  HCT  --   --  35.1*  PLT  --   --  382  LABPROT 23.0* 26.3* 25.3*  INR 2.0* 2.4* 2.3*    Estimated Creatinine Clearance: 58.8 mL/min (A) (by C-G formula based on SCr of 1.48 mg/dL (H)).   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 Tenecteplase given on 2/17 at [place]. TEE is negative for thrombus and cardiology recommends 30-day monitoring to rule out atrial fibrillation. PTA the patient is not on anticoagulation. Pharmacy is consulted to dose warfarin.   INR is therapeutic at 2.3 today. There are no signs or symptoms of bleeding noted. Hgb 11.3, platelets 382. No new interacting medications have been initiated or stopped and the patient's dietary intake remains stable.   Goal of Therapy:  INR 2-3 Monitor platelets by anticoagulation protocol: Yes   Plan:  Give warfarin PO 7.5 mg x 1 dose tonight Obtain a daily PT/INR and Q72H CBC Monitor for signs and symptoms of bleeding Watch for drug-drug interactions and significant diet changes  Shauna Hugh, PharmD, [place]  PGY-2 Pharmacy Resident 09/17/2021 8:52 AM  Please check [email or web address] for unit-specific pharmacy phone numbers.

## 2021-09-17 NOTE — Progress Notes (Signed)
STROKE TEAM PROGRESS NOTE   INTERVAL HISTORY Laying in bed. NAD. No events overnight.  Still with some feet pain.  Vitals:   09/16/21 1956 09/16/21 2342 09/17/21 0501 09/17/21 0735  BP: (!) 88/54 124/82 105/75   Pulse: 92  97 100  Resp: 17 16 13    Temp: 97.8 F (36.6 C) 97.6 F (36.4 C) 97.6 F (36.4 C) 97.7 F (36.5 C)  TempSrc: Oral Oral Oral Oral  SpO2: 98% 100% 100% 100%  Weight:      Height:       CBC:  Recent Labs  Lab 09/13/21 0421 09/17/21 0134  WBC 9.4 7.6  HGB 12.8* 11.3*  HCT 39.2 35.1*  MCV 89.3 89.8  PLT 312 676    Basic Metabolic Panel:  Recent Labs  Lab 09/12/21 0135 09/13/21 0421  NA 133* 133*  K 4.1 4.2  CL 102 101  CO2 24 23  GLUCOSE 114* 136*  BUN 26* 23*  CREATININE 1.68* 1.48*  CALCIUM 8.5* 8.4*    Lipid Panel:  No results for input(s): CHOL, TRIG, HDL, CHOLHDL, VLDL, LDLCALC in the last 168 hours.  HgbA1c:  No results for input(s): HGBA1C in the last 168 hours.  Urine Drug Screen:  No results for input(s): LABOPIA, COCAINSCRNUR, LABBENZ, AMPHETMU, THCU, LABBARB in the last 168 hours.  Alcohol Level  No results for input(s): ETH in the last 168 hours.  IMAGING past 24 hours VAS [place] LOWER EXTREMITY VENOUS (DVT)  Result Date: 09/16/2021  Lower Venous DVT Study Patient Name:  Craig Schmidt  Date of Exam:   09/16/2021 Medical Rec #: [id number]           Accession #:    [id number] Date of Birth: 07/21/1971            Patient Gender: M Patient Age:   51 years Exam Location:  [facility] Procedure:      VAS [place] LOWER EXTREMITY VENOUS (DVT) Referring Phys: Egbert Garibaldi --------------------------------------------------------------------------------  Indications: Foot pain and burning.  Comparison       Prior negative left lower extremity venous duplex done Study:           05/15/2015 Performing Technologist: Sharion Dove RVS  Examination Guidelines: A complete evaluation includes B-mode imaging, spectral Doppler, color Doppler, and  power Doppler as needed of all accessible portions of each vessel. Bilateral testing is considered an integral part of a complete examination. Limited examinations for reoccurring indications may be performed as noted. The reflux portion of the exam is performed with the patient in reverse Trendelenburg.  +---------+---------------+---------+-----------+----------+--------------+  RIGHT     Compressibility Phasicity Spontaneity Properties Thrombus Aging  +---------+---------------+---------+-----------+----------+--------------+  CFV       Full            Yes       Yes                                    +---------+---------------+---------+-----------+----------+--------------+  SFJ       Full                                                             +---------+---------------+---------+-----------+----------+--------------+  FV Prox   Full                                                             +---------+---------------+---------+-----------+----------+--------------+  FV Mid    Full                                                             +---------+---------------+---------+-----------+----------+--------------+  FV Distal Full                                                             +---------+---------------+---------+-----------+----------+--------------+  PFV       Full                                                             +---------+---------------+---------+-----------+----------+--------------+  POP       Full            Yes       Yes                                    +---------+---------------+---------+-----------+----------+--------------+  PTV       Full                                                             +---------+---------------+---------+-----------+----------+--------------+  PERO      Full                                                             +---------+---------------+---------+-----------+----------+--------------+  Soleal    Full                                                              +---------+---------------+---------+-----------+----------+--------------+   +---------+---------------+---------+-----------+----------+--------------+  LEFT      Compressibility Phasicity Spontaneity Properties Thrombus Aging  +---------+---------------+---------+-----------+----------+--------------+  CFV       Full            Yes       Yes                                    +---------+---------------+---------+-----------+----------+--------------+  SFJ       Full                                                             +---------+---------------+---------+-----------+----------+--------------+  FV Prox   Full                                                             +---------+---------------+---------+-----------+----------+--------------+  FV Mid    Full                                                             +---------+---------------+---------+-----------+----------+--------------+  FV Distal Full                                                             +---------+---------------+---------+-----------+----------+--------------+  PFV       Full                                                             +---------+---------------+---------+-----------+----------+--------------+  POP       Full            Yes       Yes                                    +---------+---------------+---------+-----------+----------+--------------+  PTV       Full                                                             +---------+---------------+---------+-----------+----------+--------------+  PERO      Full                                                             +---------+---------------+---------+-----------+----------+--------------+     Summary: BILATERAL: - No evidence of deep vein thrombosis seen in the lower extremities, bilaterally. -No evidence of popliteal cyst, bilaterally.   *See table(s) above for measurements and observations. Electronically signed by Monica Martinez MD on 09/16/2021 at 3:30:12 PM.    Final     PHYSICAL EXAM General:  Alert, well-developed, well-nourished patient in no acute distress   NEURO:  Mental Status: AA&Ox3  Speech/Language: speech is without dysarthria or aphasia.    Cranial Nerves:  II: PERRL. Visual fields full.  III, IV, VI: EOMI. Eyelids elevate symmetrically.  V: normal sensation. VII: mild right facial droop on activation. VIII: hearing intact to voice. IX, X:  Phonation is normal.  XII: tongue is midline without  fasciculations. Motor: 5/5 strength to LUE and LLE, 0/5 strength to RUE and 4/5 strength to RLE Tone: is normal and bulk is normal Sensation- right sided decreased sensation present Coordination: FTN intact on left Gait- deferred  Ext: pain on palpation of R>L calf. Right foot swelling noted but mild. No evidence of infection.  ASSESSMENT/PLAN Craig Schmidt is a 51 y.o. male with history of schwannoma, cocaine use and smoking presenting with sudden onset right sided weakness and slurred speech.  He was given TNK in the ED.  No LVO was seen on CTA head, so thrombectomy could not be performed.  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  Coumadin was started, and TEE completed.   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 Generalized volume loss and left cerebellopontine angle vestibular schwannoma ASPECTS 10.    CTA head & neck negative for LVO MRI  patchy multifocal acute ischemic infarcts in left greater than right cerebral hemispheres and cerebellum, chronic microvascular ischemic disease, 1.4 cm left vestibular schwannoma 2D Echo EF 25-30%, global left ventricular hypokinesis, grade 2 diastolic dysfunction, rheumatic mitral valve with moderate mitral stenosis, moderate to severe tricuspid regurgitation, dilated right and left atria, no  atrial level shunt TEE showed no LV thrombus, LAA spontaneous contrast, mild MR and severe MS, questionable rheumatic etiology Cardiology recommend 30 day monitoring to rule out afib as outpt LDL 77 HgbA1c 5.6 VTE prophylaxis - lovenox No antithrombotic prior to admission, now on warfarin daily and INR 2.4. 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  Tobacco abuse Current smoker Smoking cessation counseling provided Nicotine patch provided Pt is willing to quit  Cocaine abuse Cocaine cessation education provided Patient is willing to quit Okay with Coreg per cardiology  Alcohol abuse On FA/B1/MVI Watch for alcohol withdraw symptoms HD #8  Other Stroke Risk Factors Drug abuse  Other Active Problems  b/l feet/leg pain b/l feet pain which prevented him from sleeping, acetaminophen partially effective, started on gabapentin 300 mg yesterday. No edema or deformity noted to right arm and b/l feet Concerning for neuropathy from alcohol use or possible gout PRN  tramodol and tylenol con't gabapentin 300 BID. Melatonin 3mg  -> 5mg   U/S neg for DVT.  Hospital day # 9  Monitoring INR. U/S LE neg. Placement pending.   Total of 35 mins spent reviewing chart, discussion with patient and family on prognosis, Dx and plan. Discussed case with patient's nurse. Reviewed Imaging personally.    To contact Stroke Continuity provider, please refer to [email or web address] After hours,  contact General Neurology

## 2021-09-18 LAB — BASIC METABOLIC PANEL
Anion gap: 7 (ref 5–15)
BUN: 28 mg/dL — ABNORMAL HIGH (ref 6–20)
CO2: 24 mmol/L (ref 22–32)
Calcium: 8.8 mg/dL — ABNORMAL LOW (ref 8.9–10.3)
Chloride: 105 mmol/L (ref 98–111)
Creatinine, Ser: 1.35 mg/dL — ABNORMAL HIGH (ref 0.61–1.24)
GFR, Estimated: >60 mL/min (ref >60)
Glucose, Bld: 127 mg/dL — ABNORMAL HIGH (ref 70–99)
Potassium: 4.5 mmol/L (ref 3.5–5.1)
Sodium: 136 mmol/L (ref 135–145)

## 2021-09-18 LAB — PROTIME-INR
INR: 2.3 — ABNORMAL HIGH (ref 0.8–1.2)
Prothrombin Time: 25 seconds — ABNORMAL HIGH (ref 11.4–15.2)

## 2021-09-18 MED ORDER — WARFARIN SODIUM 7.5 MG PO TABS
7.5000 mg | ORAL_TABLET | Freq: Every day | ORAL | Status: DC
  Administered 2021-09-18 – 2021-09-19 (×2): 7.5 mg via ORAL
  Filled 2021-09-18 (×2): qty 1

## 2021-09-18 NOTE — Progress Notes (Signed)
Physical Therapy Treatment Patient Details Name: Craig Schmidt MRN: [id number] DOB: June 24, 1971 Today's Date: 09/18/2021   History of Present Illness 51 y.o. male presented to the emergency room for sudden onset of right-sided weakness. TNKase given. 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 PHMx:schwannoma, cocaine abuse which he says he has not used over the past few weeks.    PT Comments    Pt very motivated to return to indep. Pt aware of his R UE being flaccid however continues to have R sided inattention and possible R sided vision deficits as pt with difficulty dialing room phone to order breakfast. Pt unable to initially find phone as it was on his R side, with cues to look R pt able to find it. Pt with significant progress with ambulation, using quad cane. Pt continues with R LE weakness, decreased step height and length, and requires constant verbal cues for proper and safe sequencing of quad cane. Pt demonstrates excellent rehab potential. I anticipate pt could achieve safe mod I level of function after aggressive rehab program like [facility]. Acute PT to cont to follow.    Recommendations for follow up therapy are one component of a multi-disciplinary discharge planning process, led by the attending physician.  Recommendations may be updated based on patient status, additional functional criteria and insurance authorization.  Follow Up Recommendations  Acute inpatient rehab (3hours/day)     Assistance Recommended at Discharge Intermittent Supervision/Assistance  Patient can return home with the following Assistance with cooking/housework;Assist for transportation;Help with stairs or ramp for entrance;A little help with walking and/or transfers;A little help with bathing/dressing/bathroom   Equipment Recommendations  Other (comment)  (quad cane)    Recommendations for Other Services       Precautions / Restrictions Precautions Precautions: Fall Restrictions Weight Bearing Restrictions: No Other Position/Activity Restrictions: R UE is flaccid     Mobility  Bed Mobility Overal bed mobility: Needs Assistance Bed Mobility: Supine to Sit     Supine to sit: HOB elevated, Min guard Sit to supine: Min guard   General bed mobility comments: HOB elevated, increased time, unable to use R UE functionally    Transfers Overall transfer level: Needs assistance Equipment used: Hemi-walker, Quad cane Transfers: Sit to/from Stand Sit to Stand: Min assist           General transfer comment: minA for hand placement, push off chair/bed then reach for hemi walker/quad cane, wide base of support    Ambulation/Gait Ambulation/Gait assistance: Min assist Gait Distance (Feet): 150 Feet Assistive device: Rolling walker (2 wheels) Gait Pattern/deviations: Decreased stride length, Step-to pattern, Decreased weight shift to right, Decreased stance time - right, Wide base of support Gait velocity: decreased Gait velocity interpretation: <1.8 ft/sec, indicate of risk for recurrent falls   General Gait Details: pt required max verbal cues for sequencing of hemiwalker in L hand with R LE stepping, pt with decreased step height on R and progressed to more shuffling with R with onset of fatigue. Progressed to quad cane in L hand, pt remains steady with quad cane however continues to have difficulty sequencing the cane properly and remains to have decreased step length and height with R LE   Stairs             Wheelchair Mobility    Modified Rankin (Stroke Patients Only) Modified Rankin (Stroke Patients Only) Pre-Morbid Rankin Score: No symptoms Modified  Rankin: Moderately severe disability     Balance Overall balance assessment: Needs assistance Sitting-balance support: Feet supported, No upper extremity  supported Sitting balance-Leahy Scale: Fair Sitting balance - Comments: min guard for sitting balance on EOB   Standing balance support: Single extremity supported, Bilateral upper extremity supported, During functional activity Standing balance-Leahy Scale: Poor Standing balance comment: requires use of AD on L side to steady self                            Cognition Arousal/Alertness: Awake/alert Behavior During Therapy: WFL for tasks assessed/performed Overall Cognitive Status: Impaired/Different from baseline Area of Impairment: Awareness, Safety/judgement, Problem solving                       Following Commands: Follows one step commands consistently Safety/Judgement: Decreased awareness of safety, Decreased awareness of deficits (R sided inattention) Awareness: Emergent Problem Solving: Difficulty sequencing, Requires verbal cues, Requires tactile cues General Comments: pt very motivated to improve and become indep however continues with R sided inattention, suspect impaired vision on R side, mildly impulsive with decreased insight to safety required verabl cues, pt easily distracted with difficulty focusing        Exercises General Exercises - Lower Extremity Quad Sets: AROM, Both, 10 reps, Seated (with 5 sec hold, LEs elevated) Gluteal Sets: AROM, Both, 10 reps, Seated    General Comments General comments (skin integrity, edema, etc.): VSS      Pertinent Vitals/Pain Pain Assessment Pain Assessment: 0-10 Pain Score: 6  Pain Location: lower back Pain Descriptors / Indicators: Aching    Home Living                          Prior Function            PT Goals (current goals can now be found in the care plan section) Acute Rehab PT Goals Patient Stated Goal: walk PT Goal Formulation: With patient Time For Goal Achievement: 09/23/21 Potential to Achieve Goals: Fair Progress towards PT goals: Progressing toward goals     Frequency    Min 4X/week      PT Plan Discharge plan needs to be updated;Frequency needs to be updated    Co-evaluation              AM-PAC PT "6 Clicks" Mobility   Outcome Measure  Help needed turning from your back to your side while in a flat bed without using bedrails?: A Little Help needed moving from lying on your back to sitting on the side of a flat bed without using bedrails?: A Little Help needed moving to and from a bed to a chair (including a wheelchair)?: A Little Help needed standing up from a chair using your arms (e.g., wheelchair or bedside chair)?: A Little Help needed to walk in hospital room?: A Lot Help needed climbing 3-5 steps with a railing? : A Lot 6 Click Score: 16    End of Session Equipment Utilized During Treatment: Gait belt Activity Tolerance: Patient tolerated treatment well Patient left: in chair;with call bell/phone within reach;with chair alarm set Nurse Communication: Mobility status PT Visit Diagnosis: Other abnormalities of gait and mobility (R26.89);Muscle weakness (generalized) (M62.81);Hemiplegia and hemiparesis Hemiplegia - Right/Left: Right Hemiplegia - dominant/non-dominant: Dominant Hemiplegia - caused by: Cerebral infarction     Time: 0801-0852 PT Time Calculation (min) (ACUTE ONLY): 51 min  Charges:  $Gait Training:  23-37 mins $Therapeutic Exercise: 8-22 mins                     Kittie Plater, PT, DPT Acute Rehabilitation Services Pager #: [phone] Office #: [phone]    Berline Lopes 09/18/2021, 11:24 AM

## 2021-09-18 NOTE — Progress Notes (Signed)
Inpatient Rehab Admissions Coordinator:  ? ?Per therapy recommendations,  patient was screened for CIR candidacy by Emmalina Espericueta, MS, CCC-SLP. At this time, Pt. Appears to be a a potential candidate for CIR. I will place   order for rehab consult per protocol for full assessment. Please contact me any with questions. ? ?Luellen Howson, MS, CCC-SLP ?Rehab Admissions Coordinator  ?[phone] (celll) ?[phone] (office) ? ?

## 2021-09-18 NOTE — Progress Notes (Addendum)
STROKE TEAM PROGRESS NOTE   INTERVAL HISTORY Patient is seen in his room with no family at the bedside.  Earlier today, he had an episode of transient dizziness, hypotension and chest pain when getting OOB to the chair.  Dizziness and hypotension resolved, and ECG showed no ST changes.  Vitals:   09/18/21 1013 09/18/21 1019 09/18/21 1020 09/18/21 1120  BP: (!) 85/50 (!) 76/45 (!) 76/41 (!) 94/46  Pulse:      Resp: 14 15 14 14   Temp:  98.2 F (36.8 C)    TempSrc:  Oral    SpO2:  98%    Weight:      Height:       CBC:  Recent Labs  Lab 09/13/21 0421 09/17/21 0134  WBC 9.4 7.6  HGB 12.8* 11.3*  HCT 39.2 35.1*  MCV 89.3 89.8  PLT 312 798    Basic Metabolic Panel:  Recent Labs  Lab 09/13/21 0421 09/18/21 0216  NA 133* 136  K 4.2 4.5  CL 101 105  CO2 23 24  GLUCOSE 136* 127*  BUN 23* 28*  CREATININE 1.48* 1.35*  CALCIUM 8.4* 8.8*    Lipid Panel:  No results for input(s): CHOL, TRIG, HDL, CHOLHDL, VLDL, LDLCALC in the last 168 hours.  HgbA1c:  No results for input(s): HGBA1C in the last 168 hours.  Urine Drug Screen:  No results for input(s): LABOPIA, COCAINSCRNUR, LABBENZ, AMPHETMU, THCU, LABBARB in the last 168 hours.  Alcohol Level  No results for input(s): ETH in the last 168 hours.  IMAGING past 24 hours No results found.  PHYSICAL EXAM General:  Alert, well-developed, well-nourished patient in no acute distress   NEURO:  Mental Status: AA&Ox3  Speech/Language: speech is without dysarthria or aphasia.    Cranial Nerves:  II: PERRL. Visual fields full.  III, IV, VI: EOMI. Eyelids elevate symmetrically.  V: normal sensation. VII: mild right facial droop on activation. VIII: hearing intact to voice. IX, X:  Phonation is normal.  XII: tongue is midline without fasciculations. Motor: 5/5 strength to LUE and LLE, 0/5 strength to RUE and 4/5 strength to RLE Tone: is normal and bulk is normal Sensation- right sided decreased sensation  present Coordination: FTN intact on left Gait- deferred  Right foot swelling noted but mild. No evidence of infection.  ASSESSMENT/PLAN [name] is a 51 y.o. male with history of schwannoma, cocaine use and smoking presenting with sudden onset right sided weakness and slurred speech.  He was given TNK in the ED.  No LVO was seen on CTA head, so thrombectomy could not be performed.  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  Coumadin was started, and TEE completed. Patient is now awaiting placement in SNF vs. CIR.  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 Generalized volume loss and left cerebellopontine angle vestibular schwannoma ASPECTS 10.    CTA head & neck negative for LVO MRI  patchy multifocal acute ischemic infarcts in left greater than right cerebral hemispheres and cerebellum, chronic microvascular ischemic disease, 1.4 cm left vestibular schwannoma 2D Echo EF 25-30%, global left ventricular hypokinesis, grade 2 diastolic dysfunction, rheumatic mitral valve with moderate mitral stenosis, moderate to severe tricuspid regurgitation, dilated right and left atria, no atrial level shunt TEE showed no LV thrombus, LAA spontaneous contrast, mild MR and severe MS, questionable rheumatic etiology Cardiology recommend  30 day monitoring to rule out afib as outpt LDL 77 HgbA1c 5.6 VTE prophylaxis - lovenox No antithrombotic prior to admission, now on warfarin daily and INR 2.4. 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  Tobacco abuse Current smoker Smoking cessation counseling provided Nicotine patch provided Pt is willing to quit  Cocaine abuse Cocaine cessation education provided Patient is willing to quit Okay with Coreg per cardiology  Alcohol abuse On FA/B1/MVI Watch for alcohol withdraw symptoms HD #8  Other Stroke Risk Factors Drug abuse  Other Active Problems  b/l feet/leg pain b/l feet pain which prevented him from sleeping, acetaminophen partially effective, started on gabapentin 300 mg yesterday. No edema or deformity noted to right arm and b/l feet Concerning for neuropathy from alcohol use or possible gout PRN tramodol and tylenol con't gabapentin 300 BID. Melatonin 3mg  -> 5mg   U/S neg for DVT.  Hospital day # [place] , MSN, AGACNP-BC [facility] See Amion for schedule and pager information 09/18/2021 1:16 PM  ATTENDING ATTESTATION:  51 y/o h/o schwannoma, cocaine use with left MCA CVA due to cardiomyopathy, low EF, cocaine use. Episode of hypotension after taking all his BP meds this morning. Quickly returned to baseline. Exam is unchanged from yesterday.   Dr. Reeves Forth evaluated pt independently, reviewed imaging, chart, labs. Discussed and formulated plan with the APP. Please see APP note above for details.   Total 36 minutes  spent on counseling patient and coordinating care, writing notes and reviewing chart.   Oralee Rapaport,MD     To contact Stroke Continuity provider, please refer to [email or web address] After hours, contact General Neurology

## 2021-09-18 NOTE — Progress Notes (Signed)
Heart Failure Stewardship Pharmacist Progress Note   PCP: Placey, Audrea Muscat, NP PCP-Cardiologist: None    HPI:  51 yo M with PMH of vestibular schwannoma, asthma, alcohol and cocaine use. He presented to the ED on 2/17 as a code stroke. Received TNK in ED. CXR with pulmonary vascular congestion and mild pulmonary edema. ECHO done on 2/17 with LVEF 25-30%, G2DD, and mildly reduced RV as well as severe mitral stenosis, mild-moderate mitral regurgitation, and moderate-severe tricuspid regurgitation. Suspected rheumatic valve disease. TEE on 2/20 with mild tricuspid regurgitation and findings concerning for rheumatic heart disease but no extensive chordal thickening.   Current HF Medications: Beta Blocker: carvedilol 12.5 mg BID ACE/ARB/ARNI: Entresto 24/26 mg BID SGLT2i: Jardiance 10 mg daily Other: BiDil 1 tab TID  Prior to admission HF Medications: None  Pertinent Lab Values: Serum creatinine 1.35, BUN 28, Potassium 4.5, Sodium 136, BNP 1570, A1c 5.6   Vital Signs: Weight: 176 lbs (admission weight: 176 lbs) Blood pressure: 90-110/60s  Heart rate: 90-100s  I/O: -1L yesterday; net -10.8L  Medication Assistance / Insurance Benefits Check: Does the patient have prescription insurance?  No  Does the patient qualify for medication assistance through manufacturers or grants?   Pending Eligible grants and/or patient assistance programs: Entresto/Jardiance if meets income requirements Medication assistance applications in progress: none  Medication assistance applications approved: none Approved medication assistance renewals will be completed by: TBD  Outpatient Pharmacy:  Prior to admission outpatient pharmacy: [facility] Is the patient willing to use [place] pharmacy at discharge? Yes Is the patient willing to transition their outpatient pharmacy to utilize a [facility] outpatient pharmacy?   Pending    Assessment: 1. Acute systolic CHF (EF 16-10%), due to presumed NICM in the  setting of uncontrolled HTN and cocaine/alcohol use. NYHA class II symptoms. - Continue carvedilol 12.5 mg BID - Continue Entresto 24/26 mg BID - Consider adding spironolactone prior to discharge if BP allows - Continue Jardiance 10 mg daily - Hypotension today - consider stopping BiDil 1 tab TID - LHC planned as outpatient - discussed with cardiology team and there are no financial concerns with insurance status    Plan: 1) Medication changes recommended at this time: - Stop BiDil with hypotension  2) Patient assistance: - Uninsured - HF TOC appt scheduled and will help with pt assistance at that time. Medicaid pending  3)  Education  - To be completed prior to discharge  Kerby Nora, PharmD, BCPS Heart Failure Stewardship Pharmacist Phone [phone]

## 2021-09-18 NOTE — Progress Notes (Signed)
Pt c/o dizziness while up in a chair. AM BP 114/76, received am meds.  NT assisted pt back into bed, BP 84/52.  Recheck 76/45, 76/41.  Pt A&O, some lightheadedness, wants to finish eating breakfast.  Message sent to Dr Reeves Forth, neurology MD.

## 2021-09-18 NOTE — Progress Notes (Signed)
ANTICOAGULATION CONSULT NOTE - Follow Up Consult  Pharmacy Consult for Warfarin Indication:  CVA, rheumatic valve stenosis  Allergies  Allergen Reactions   Peanut-Containing Drug Products Itching   Shellfish Allergy Swelling    Patient Measurements: Height: 5\' 5"  (165.1 cm) Weight: 80 kg (176 lb 5.9 oz) IBW/kg (Calculated) : 61.5 kg  Vital Signs: Temp: 97.8 F (36.6 C) (02/27 0758) Temp Source: Oral (02/27 0758) BP: 114/76 (02/27 0758) Pulse Rate: 91 (02/27 0758)  Labs: Recent Labs    09/16/21 0216 09/17/21 0134 09/18/21 0216  HGB  --  11.3*  --   HCT  --  35.1*  --   PLT  --  382  --   LABPROT 26.3* 25.3* 25.0*  INR 2.4* 2.3* 2.3*  CREATININE  --   --  1.35*     Estimated Creatinine Clearance: 64.5 mL/min (A) (by C-G formula based on SCr of 1.35 mg/dL (H)).   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 Tenecteplase given on 2/17 at [place]. TEE is negative for thrombus and cardiology recommends 30-day monitoring to rule out atrial fibrillation. PTA the patient is not on anticoagulation. Pharmacy is consulted to dose warfarin.   INR is therapeutic at 2.3 today   Goal of Therapy:  INR 2-3 Monitor platelets by anticoagulation protocol: Yes   Plan:  Warfarin 7.5 mg po daily  Obtain a daily PT/INR and Q72H CBC Monitor for signs and symptoms of bleeding Watch for drug-drug interactions and significant diet changes  Thank you Anette Guarneri, PharmD 09/18/2021 9:00 AM  Please check [email or web address] for unit-specific pharmacy phone numbers.

## 2021-09-19 ENCOUNTER — Telehealth (HOSPITAL_COMMUNITY): Payer: Self-pay | Admitting: Licensed Clinical Social Worker

## 2021-09-19 ENCOUNTER — Encounter (HOSPITAL_COMMUNITY): Payer: Self-pay | Admitting: Student in an Organized Health Care Education/Training Program

## 2021-09-19 LAB — PROTIME-INR
INR: 2.5 — ABNORMAL HIGH (ref 0.8–1.2)
Prothrombin Time: 27 seconds — ABNORMAL HIGH (ref 11.4–15.2)

## 2021-09-19 MED ORDER — CARVEDILOL 6.25 MG PO TABS
6.2500 mg | ORAL_TABLET | Freq: Two times a day (BID) | ORAL | Status: DC
  Administered 2021-09-19 – 2021-09-20 (×2): 6.25 mg via ORAL
  Filled 2021-09-19 (×3): qty 1

## 2021-09-19 NOTE — Telephone Encounter (Signed)
CSW  received referral to assist patient with transportation to [facility] appointment. CSW attempted to contact patient with no answer and unable to leave a message. Will attempt again. Raquel Sarna, [place], [place]

## 2021-09-19 NOTE — Progress Notes (Signed)
Inpatient Rehab Admissions Coordinator:   I spoke with Pt. Regarding potential CIR admit. Unfortunately, Pt. Does not appear to have a safe disposition at this time. Pt. Reports he was living in a motel, but likely cannot go back there. He does have a girlfriend who could provide some care, but reports she is also homeless. I spoke with this sister, Alease Frame, who reports Pt. Cannot stay with her as she has a disabled child and cannot care or provide a home to the patient. At this time, CIR is unable to accept due to lack of disposition. If TOC is able to identify a dispo, can re-consider.   Clemens Catholic, [place], [place] Admissions Coordinator  [phone] ([place]) [phone] (office)

## 2021-09-19 NOTE — Progress Notes (Signed)
Heart Failure Stewardship Pharmacist Progress Note   PCP: Placey, Audrea Muscat, NP PCP-Cardiologist: None    HPI:  51 yo M with PMH of vestibular schwannoma, asthma, alcohol and cocaine use. He presented to the ED on 2/17 as a code stroke. Received TNK in ED. CXR with pulmonary vascular congestion and mild pulmonary edema. ECHO done on 2/17 with LVEF 25-30%, G2DD, and mildly reduced RV as well as severe mitral stenosis, mild-moderate mitral regurgitation, and moderate-severe tricuspid regurgitation. Suspected rheumatic valve disease. TEE on 2/20 with mild tricuspid regurgitation and findings concerning for rheumatic heart disease but no extensive chordal thickening.   Current HF Medications: Beta Blocker: carvedilol 12.5 mg BID ACE/ARB/ARNI: Entresto 24/26 mg BID SGLT2i: Jardiance 10 mg daily Other: BiDil 1 tab TID  Prior to admission HF Medications: None  Pertinent Lab Values: Serum creatinine 1.35, BUN 28, Potassium 4.5, Sodium 136, BNP 1570, A1c 5.6   Vital Signs: Weight: 176 lbs (admission weight: 176 lbs) Blood pressure: 90-110/60s  Heart rate: 90-100s  I/O not documented  Medication Assistance / Insurance Benefits Check: Does the patient have prescription insurance?  No  Does the patient qualify for medication assistance through manufacturers or grants?   Pending Eligible grants and/or patient assistance programs: Entresto/Jardiance if meets income requirements Medication assistance applications in progress: none  Medication assistance applications approved: none Approved medication assistance renewals will be completed by: TBD  Outpatient Pharmacy:  [facility] Is the patient willing to use [place] pharmacy at discharge? Yes Is the patient willing to transition their outpatient pharmacy to utilize a [facility] outpatient pharmacy?   Pending    Assessment: 1. Acute systolic CHF (EF 46-80%), due to presumed NICM in the setting of  uncontrolled HTN and cocaine/alcohol use. NYHA class II symptoms. - Continue carvedilol 12.5 mg BID - Continue Entresto 24/26 mg BID - Consider adding spironolactone prior to discharge if BP allows - Continue Jardiance 10 mg daily - Continue BiDil 1 tab TID - LHC planned as outpatient - discussed with cardiology team and there are no financial concerns with insurance status    Plan: 1) Medication changes recommended at this time: - Continue current regimen  2) Patient assistance: - Uninsured - HF TOC appt scheduled and will help with pt assistance at that time. Medicaid pending  3)  Education  - To be completed prior to discharge  Kerby Nora, PharmD, BCPS Heart Failure Stewardship Pharmacist Phone [phone]

## 2021-09-19 NOTE — Progress Notes (Addendum)
Heart Failure Navigation Team Progress Note  PCP: Placey, Audrea Muscat, NP Primary Cardiologist: None Admitted from:   Past Medical History:  Diagnosis Date   Asthma    Brain tumor [facility])     Social History   Socioeconomic History   Marital status: Married    Spouse name: Not on file   Number of children: Not on file   Years of education: Not on file   Highest education level: Not on file  Occupational History   Not on file  Tobacco Use   Smoking status: Every Day    Packs/day: 2.00    Types: Cigarettes   Smokeless tobacco: Never  Substance and Sexual Activity   Alcohol use: Yes    Comment: Socially    Drug use: No   Sexual activity: Not on file  Other Topics Concern   Not on file  Social History Narrative   Not on file   Social Determinants of Health   Financial Resource Strain: Not on file  Food Insecurity: Not on file  Transportation Needs: Not on file  Physical Activity: Not on file  Stress: Not on file  Social Connections: Not on file     Heart & Vascular Transition of Care Clinic follow-up: Scheduled for 09/26/21@10 .  Confirmed patient will need transportation.  Immediate social needs: [employer], transportation, housing  HF CSW reached out to [employer] to follow up on the health insurance/Medicaid pending.  Response: [facility] reported "Yes, he is pending Medicaid.  His case is pending a disability evaluation.  I am not sure how long this will take.  He is being evaluated under the LTC program and will need an FL2 from the skilled nursing facility once he is placed as well."   [place], MSW, [place] Heart Failure Social Worker

## 2021-09-19 NOTE — Progress Notes (Addendum)
STROKE TEAM PROGRESS NOTE   INTERVAL HISTORY Patient is seen in his room with no family at the bedside.  His blood pressure was on the low end of normal after receiving his medications this morning, but he reported no dizziness or weakness.  Patient states that he is very motivated to work with PT/OT.  He does report some difficulty seeing words on his tablet and requests an eye exam.  Vitals:   09/18/21 2332 09/19/21 0318 09/19/21 0700 09/19/21 1026  BP: (!) 91/57 97/63 117/72 96/61  Pulse: 96 100 97   Resp: 16 17  17   Temp: 98.3 F (36.8 C) 98.6 F (37 C) 98.1 F (36.7 C)   TempSrc:  Oral Oral   SpO2: 100% 99% 100%   Weight:      Height:       CBC:  Recent Labs  Lab 09/13/21 0421 09/17/21 0134  WBC 9.4 7.6  HGB 12.8* 11.3*  HCT 39.2 35.1*  MCV 89.3 89.8  PLT 312 657    Basic Metabolic Panel:  Recent Labs  Lab 09/13/21 0421 09/18/21 0216  NA 133* 136  K 4.2 4.5  CL 101 105  CO2 23 24  GLUCOSE 136* 127*  BUN 23* 28*  CREATININE 1.48* 1.35*  CALCIUM 8.4* 8.8*    Lipid Panel:  No results for input(s): CHOL, TRIG, HDL, CHOLHDL, VLDL, LDLCALC in the last 168 hours.  HgbA1c:  No results for input(s): HGBA1C in the last 168 hours.  Urine Drug Screen:  No results for input(s): LABOPIA, COCAINSCRNUR, LABBENZ, AMPHETMU, THCU, LABBARB in the last 168 hours.  Alcohol Level  No results for input(s): ETH in the last 168 hours.  IMAGING past 24 hours No results found.  PHYSICAL EXAM General:  Alert, well-developed, well-nourished patient in no acute distress   NEURO:  Mental Status: AA&Ox3  Speech/Language: speech is without dysarthria or aphasia.    Cranial Nerves:  II: PERRL. Visual fields full.  III, IV, VI: EOMI. Eyelids elevate symmetrically.  V: normal sensation. VII: mild right facial droop on activation. VIII: hearing intact to voice. IX, X:  Phonation is normal.  XII: tongue is midline without fasciculations. Motor: 5/5 strength to LUE and LLE,  0/5 strength to RUE and 4/5 strength to RLE Tone: is normal and bulk is normal Sensation- right sided decreased sensation present Coordination: FTN intact on left Gait- deferred  Right foot swelling noted but mild. No evidence of infection.  ASSESSMENT/PLAN [name] is a 51 y.o. male with history of schwannoma, cocaine use and smoking presenting with sudden onset right sided weakness and slurred speech.  He was given TNK in the ED.  No LVO was seen on CTA head, so thrombectomy could not be performed.  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  Coumadin was started, and TEE completed. Patient is now awaiting placement in SNF vs. CIR.  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 Generalized volume loss and left cerebellopontine angle vestibular schwannoma ASPECTS 10.    CTA head & neck negative for LVO MRI  patchy multifocal acute ischemic infarcts in left greater than right cerebral hemispheres and cerebellum, chronic microvascular ischemic disease, 1.4 cm left vestibular schwannoma 2D Echo EF 25-30%, global left ventricular hypokinesis, grade 2 diastolic dysfunction, rheumatic mitral valve with moderate mitral stenosis, moderate to severe tricuspid regurgitation, dilated right and  left atria, no atrial level shunt TEE showed no LV thrombus, LAA spontaneous contrast, mild MR and severe MS, questionable rheumatic etiology Cardiology recommend 30 day monitoring to rule out afib as outpt LDL 77 HgbA1c 5.6 VTE prophylaxis - lovenox No antithrombotic prior to admission, now on warfarin daily and INR 2.4. Therapy recommendations:  SNF vs. 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  Tobacco abuse Current smoker Smoking cessation counseling provided Nicotine patch provided Pt is willing to quit  Cocaine abuse Cocaine cessation education provided Patient is willing to quit Okay with Coreg per cardiology  Alcohol abuse On FA/B1/MVI Watch for alcohol withdraw symptoms HD #11  Other Stroke Risk Factors Drug abuse  Other Active Problems  b/l feet/leg pain b/l feet pain which prevented him from sleeping, acetaminophen partially effective, started on gabapentin 300 mg yesterday. No edema or deformity noted to right arm and b/l feet Concerning for neuropathy from alcohol use or possible gout PRN tramodol and tylenol con't gabapentin 300 BID. Melatonin 3mg  -> 5mg   U/S neg for DVT.  Hospital day # [place] , MSN, AGACNP-BC [facility] See Amion for schedule and pager information 09/19/2021 1:00 PM  ATTENDING ATTESTATION:   51 y/o h/o schwannoma, cocaine use with left MCA CVA due to cardiomyopathy, low EF, cocaine use. Exam is stable. Low BP, will decrease coreg. Difficult placement.    Dr. Reeves Forth evaluated pt independently,  reviewed imaging, chart, labs. Discussed and formulated plan with the APP. Please see APP note above for details. Total 36 minutes spent on counseling patient and coordinating care, writing notes and reviewing chart.     Craig Venn,MD      To contact Stroke Continuity provider, please refer to [email or web address] After hours, contact General Neurology

## 2021-09-19 NOTE — Progress Notes (Signed)
Heart Failure Nurse Navigator Progress Note  Assessed for HV TOC readiness. Pt has appt scheduled 3/7. Need update with dishcarge plan: CIR? SNF? Prior to admission pt experiencing homelessness. Pt pending medicaid. Pt is unemployed. Positive for cocaine.   EF 25-30%, RV severely reduced. Severe MS. Moderate MR. G2DD.   Barriers of Care:   -new dx -experiencing homelessness -substance abuse -no income -new medication regimen -smoking cessation -alcohol dependence  Considerations/Referrals:   Referral made to Heart Failure Pharmacist Stewardship: yes, appreciated Referral made to Heart Failure CSW/NCM TOC: yes, appreciated Referral made to Heart & Vascular TOC clinic: yes, 3/7   Pricilla Holm, MSN, RN Heart Failure Nurse Navigator [phone]

## 2021-09-19 NOTE — Progress Notes (Signed)
ANTICOAGULATION CONSULT NOTE - Follow Up Consult  Pharmacy Consult for Warfarin Indication:  CVA, rheumatic valve stenosis  Allergies  Allergen Reactions   Peanut-Containing Drug Products Itching   Shellfish Allergy Swelling    Patient Measurements: Height: 5\' 5"  (165.1 cm) Weight: 80 kg (176 lb 5.9 oz) IBW/kg (Calculated) : 61.5 kg  Vital Signs: Temp: 98.1 F (36.7 C) (02/28 0700) Temp Source: Oral (02/28 0700) BP: 117/72 (02/28 0700) Pulse Rate: 97 (02/28 0700)  Labs: Recent Labs    09/17/21 0134 09/18/21 0216 09/19/21 0332  HGB 11.3*  --   --   HCT 35.1*  --   --   PLT 382  --   --   LABPROT 25.3* 25.0* 27.0*  INR 2.3* 2.3* 2.5*  CREATININE  --  1.35*  --      Estimated Creatinine Clearance: 64.5 mL/min (A) (by C-G formula based on SCr of 1.35 mg/dL (H)).   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 Tenecteplase given on 2/17 at [place]. TEE is negative for thrombus and cardiology recommends 30-day monitoring to rule out atrial fibrillation. PTA the patient is not on anticoagulation. Pharmacy is consulted to dose warfarin.   INR is therapeutic at 2.5 today, if INR continues to increase will change to 7.5 / 5 mg alternating dose.   Goal of Therapy:  INR 2-3 Monitor platelets by anticoagulation protocol: Yes   Plan:  Continue Warfarin 7.5 mg po daily  Obtain a daily PT/INR and Q72H CBC Monitor for signs and symptoms of bleeding Watch for drug-drug interactions and significant diet changes  Thank you Anette Guarneri, PharmD 09/19/2021 8:17 AM  Please check [email or web address] for unit-specific pharmacy phone numbers.

## 2021-09-19 NOTE — Progress Notes (Signed)
Physical Therapy Treatment Patient Details Name: Craig Schmidt MRN: [id number] DOB: August 19, 1970 Today's Date: 09/19/2021   History of Present Illness 51 y.o. male presented to the emergency room for sudden onset of right-sided weakness. TNKase given. 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 PHMx:schwannoma, cocaine abuse which he says he has not used over the past few weeks.    PT Comments    Pt progressing well from ambulation and R LE strengthening stand point. R UE remains flaccid. Pt attempting to problem solve with opening items on lunch tray with L UE and mouth. Aware pt with no home however pt to strongly benefit from AIR upon d/c as pt demonstrates potential to become mod I and be safe to d/c to motel/streets with his girlfriend. Acute PT to cont to follow.    Recommendations for follow up therapy are one component of a multi-disciplinary discharge planning process, led by the attending physician.  Recommendations may be updated based on patient status, additional functional criteria and insurance authorization.  Follow Up Recommendations  Acute inpatient rehab (3hours/day)     Assistance Recommended at Discharge Frequent or constant Supervision/Assistance  Patient can return home with the following Assistance with cooking/housework;Assist for transportation;Help with stairs or ramp for entrance;A little help with walking and/or transfers   Equipment Recommendations       Recommendations for Other Services       Precautions / Restrictions Precautions Precautions: Fall Restrictions Weight Bearing Restrictions: No Other Position/Activity Restrictions: R UE is flaccid     Mobility  Bed Mobility               General bed mobility comments: pt up in chair upon PT arrival    Transfers Overall transfer level:  Needs assistance Equipment used: 1 person hand held assist, None Transfers: Sit to/from Stand Sit to Stand: Min guard           General transfer comment: min guard for safety, pt less impulsive more cautious/guarded    Ambulation/Gait Ambulation/Gait assistance: Min assist Gait Distance (Feet): 200 Feet Assistive device: 1 person hand held assist, None Gait Pattern/deviations: Step-to pattern, Decreased weight shift to right, Decreased stance time - right, Wide base of support, Decreased dorsiflexion - right, Decreased stride length Gait velocity: decreased     General Gait Details: pt unable to sequence quad cane effectively, trialed L HHA in which pt only requiring support to steady/provide confidence. Pt did amb 100' without L HHA, pt with decresaed step length and height on R LE compared to with L HHA however no overt episode of LOB, antalgia R LE limp noted   Stairs             Wheelchair Mobility    Modified Rankin (Stroke Patients Only) Modified Rankin (Stroke Patients Only) Pre-Morbid Rankin Score: No symptoms Modified Rankin: Moderately severe disability     Balance Overall balance assessment: Needs assistance Sitting-balance support: Feet supported, No upper extremity supported Sitting balance-Leahy Scale: Fair Sitting balance - Comments: min guard for sitting balance on EOB   Standing balance support: Single extremity supported, Bilateral upper extremity supported, During functional activity Standing balance-Leahy Scale: Fair Standing balance comment: requires use of AD on L side to steady self                            Cognition Arousal/Alertness:  Awake/alert Behavior During Therapy: WFL for tasks assessed/performed Overall Cognitive Status: Within Functional Limits for tasks assessed                                 General Comments: mild R sided in attention however pt trying to problem solving using L UE and mouth to  open sugar packages and trying to open drink at R UE flaccid, pt aware his social situation isn't ideal, was working on [employer] for a couple room for him and his girlfriend        Exercises Other Exercises Other Exercises: worked on steping up 1 step with R UE leading x 10 reps x 2 sets, tried to diminish use of L UE to push up and focus on R LE strengthening Other Exercises: did lateral step ups with R LE to toe taps, pt with good control and minA to steady x 10 reps    General Comments General comments (skin integrity, edema, etc.): VSS      Pertinent Vitals/Pain Pain Assessment Pain Assessment: No/denies pain    Home Living                          Prior Function            PT Goals (current goals can now be found in the care plan section) Acute Rehab PT Goals Patient Stated Goal: get this R arm working PT Goal Formulation: With patient Time For Goal Achievement: 09/23/21 Potential to Achieve Goals: Fair Progress towards PT goals: Progressing toward goals    Frequency    Min 4X/week      PT Plan Current plan remains appropriate    Co-evaluation              AM-PAC PT "6 Clicks" Mobility   Outcome Measure  Help needed turning from your back to your side while in a flat bed without using bedrails?: A Little Help needed moving from lying on your back to sitting on the side of a flat bed without using bedrails?: A Little Help needed moving to and from a bed to a chair (including a wheelchair)?: A Little Help needed standing up from a chair using your arms (e.g., wheelchair or bedside chair)?: A Little Help needed to walk in hospital room?: A Lot Help needed climbing 3-5 steps with a railing? : A Lot 6 Click Score: 16    End of Session Equipment Utilized During Treatment: Gait belt Activity Tolerance: Patient tolerated treatment well Patient left: in chair;with call bell/phone within reach;with chair alarm set Nurse Communication:  Mobility status PT Visit Diagnosis: Other abnormalities of gait and mobility (R26.89);Muscle weakness (generalized) (M62.81);Hemiplegia and hemiparesis Hemiplegia - Right/Left: Right Hemiplegia - dominant/non-dominant: Dominant Hemiplegia - caused by: Cerebral infarction     Time: 2297-9892 PT Time Calculation (min) (ACUTE ONLY): 25 min  Charges:  $Gait Training: 8-22 mins $Therapeutic Exercise: 8-22 mins                     Kittie Plater, PT, DPT Acute Rehabilitation Services Pager #: [phone] Office #: [phone]    Berline Lopes 09/19/2021, 2:26 PM

## 2021-09-20 LAB — PROTIME-INR
INR: 2.8 — ABNORMAL HIGH (ref 0.8–1.2)
Prothrombin Time: 29.6 seconds — ABNORMAL HIGH (ref 11.4–15.2)

## 2021-09-20 MED ORDER — CARVEDILOL 3.125 MG PO TABS
3.1250 mg | ORAL_TABLET | Freq: Two times a day (BID) | ORAL | Status: DC
Start: 2021-09-20 — End: 2021-10-03
  Administered 2021-09-20 – 2021-10-03 (×24): 3.125 mg via ORAL
  Filled 2021-09-20 (×26): qty 1

## 2021-09-20 MED ORDER — WARFARIN SODIUM 6 MG PO TABS
6.0000 mg | ORAL_TABLET | Freq: Once | ORAL | Status: AC
  Administered 2021-09-20: 6 mg via ORAL
  Filled 2021-09-20: qty 1

## 2021-09-20 NOTE — Progress Notes (Signed)
Inpatient Rehab Admissions Coordinator:  ? ? Case discussed with Dr. Naaman Plummer, CIR MD to see if admitting patient for a short rehab stay and then discharging Pt. To the street in the care of his girlfriend was an option. This is not an option for this Pt. And CIR will not pursue admission.  ? ?Clemens Catholic, MS, CCC-SLP ?Rehab Admissions Coordinator  ?[phone] (celll) ?[phone] (office) ? ?

## 2021-09-20 NOTE — Progress Notes (Signed)
Physical Therapy Treatment ?Patient Details ?Name: Craig Schmidt ?MRN: [id number] ?DOB: 1970-09-12 ?Today's Date: 09/20/2021 ? ? ?History of Present Illness 51 y.o. male presented to the emergency room for sudden onset of right-sided weakness. TNKase given. 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 PHMx:schwannoma, cocaine abuse which he says he has not used over the past few weeks. ? ?  ?PT Comments  ? ? Pt is making notable progress toward goals.  Emphasis on transitions to EOB, sit to stand safety, progression of gait stability, quality and stamina. Warm up/strengthening exercise l   ?Recommendations for follow up therapy are one component of a multi-disciplinary discharge planning process, led by the attending physician.  Recommendations may be updated based on patient status, additional functional criteria and insurance authorization. ? ?Follow Up Recommendations ? Acute inpatient rehab (3hours/day) ?  ?  ?Assistance Recommended at Discharge Frequent or constant Supervision/Assistance  ?Patient can return home with the following Assistance with cooking/housework;Assist for transportation;Help with stairs or ramp for entrance;A little help with walking and/or transfers ?  ?Equipment Recommendations ? Other (comment) (TBA)  ?  ?Recommendations for Other Services   ? ? ?  ?Precautions / Restrictions Precautions ?Precautions: Fall ?Restrictions ?Other Position/Activity Restrictions: R UE is flaccid  ?  ? ?Mobility ? Bed Mobility ?Overal bed mobility: Needs Assistance ?Bed Mobility: Supine to Sit ?  ?  ?Supine to sit: Min assist ?  ?  ?General bed mobility comments: able to get to EOB with use of rails ?  ? ?Transfers ?Overall transfer level: Needs assistance ?  ?Transfers: Sit to/from Stand ?Sit to Stand: Min guard ?  ?  ?  ?  ?  ?General transfer comment: cued  for hand placement, otherwise to AD ?  ? ?Ambulation/Gait ?Ambulation/Gait assistance: Min assist ?Gait Distance (Feet): 110 Feet (x2) ?Assistive device: 1 person hand held assist, None (rail) ?Gait Pattern/deviations: Step-to pattern, Step-through pattern ?Gait velocity: decreased ?Gait velocity interpretation: <1.8 ft/sec, indicate of risk for recurrent falls ?  ?General Gait Details: progress to step through pattern with work on improved heel toe though still paretic on the RW needing min stability assist ? ? ?Stairs ?  ?  ?  ?  ?  ? ? ?Wheelchair Mobility ?  ? ?Modified Rankin (Stroke Patients Only) ?Modified Rankin (Stroke Patients Only) ?Pre-Morbid Rankin Score: No symptoms ?Modified Rankin: Moderately severe disability ? ? ?  ?Balance   ?  ?Sitting balance-Leahy Scale: Fair ?  ?  ?  ?Standing balance-Leahy Scale: Fair ?  ?  ?  ?  ?  ?  ?  ?  ?  ?  ?  ?  ?  ? ?  ?Cognition Arousal/Alertness: Awake/alert ?Behavior During Therapy: [facility] for tasks assessed/performed ?Overall Cognitive Status: Within Functional Limits for tasks assessed ?  ?  ?  ?  ?  ?  ?  ?  ?  ?  ?  ?  ?  ?  ?  ?  ?  ?  ?  ? ?  ?Exercises General Exercises - Lower Extremity ?Hip ABduction/ADduction: AROM, Strengthening, Both, 10 reps, Standing ?Hip Flexion/Marching: AROM, Strengthening, 10 reps, Standing ?Toe Raises: AROM, Left, 10 reps (Unable to lift the R heel off the floor) ?Heel Raises: AROM, Strengthening, 10 reps, Standing ?Mini-Sqauts: AROM, 10 reps, Standing ?Other Exercises ?Other Exercises: modified standing push ups x10 ? ?  ?General Comments   ?  ?  ? ?  Pertinent Vitals/Pain Pain Assessment ?Pain Score: 0-No pain ?Pain Intervention(s): Monitored during session  ? ? ?Home Living   ?  ?  ?  ?  ?  ?  ?  ?  ?  ?   ?  ?Prior Function    ?  ?  ?   ? ?PT Goals (current goals can now be found in the care plan section) Acute Rehab PT Goals ?PT Goal Formulation: With patient ?Time For Goal Achievement: 09/23/21 ?Potential to Achieve Goals:  Fair ?Progress towards PT goals: Progressing toward goals ? ?  ?Frequency ? ? ? Min 4X/week ? ? ? ?  ?PT Plan Current plan remains appropriate  ? ? ?Co-evaluation   ?  ?  ?  ?  ? ?  ?AM-PAC PT "6 Clicks" Mobility   ?Outcome Measure ? Help needed turning from your back to your side while in a flat bed without using bedrails?: A Little ?Help needed moving from lying on your back to sitting on the side of a flat bed without using bedrails?: A Little ?Help needed moving to and from a bed to a chair (including a wheelchair)?: A Little ?Help needed standing up from a chair using your arms (e.g., wheelchair or bedside chair)?: A Little ?Help needed to walk in hospital room?: A Little ?Help needed climbing 3-5 steps with a railing? : A Lot ?6 Click Score: 17 ? ?  ?End of Session   ?Activity Tolerance: Patient tolerated treatment well ?Patient left: in chair;with call bell/phone within reach;with chair alarm set ?Nurse Communication: Mobility status ?PT Visit Diagnosis: Other abnormalities of gait and mobility (R26.89);Muscle weakness (generalized) (M62.81);Hemiplegia and hemiparesis ?Hemiplegia - Right/Left: Right ?Hemiplegia - dominant/non-dominant: Dominant ?Hemiplegia - caused by: Cerebral infarction ?  ? ? ?Time: 1583-0940 ?PT Time Calculation (min) (ACUTE ONLY): 26 min ? ?Charges:  $Gait Training: 8-22 mins ?$Therapeutic Activity: 8-22 mins          ?          ? ?09/20/2021 ? ?Ginger Carne., PT ?Acute Rehabilitation Services ?[phone]  (pager) ?[phone]  (office) ? ? ?Tessie Fass Kylan Liberati ?09/20/2021, 3:59 PM ? ?

## 2021-09-20 NOTE — Progress Notes (Signed)
ANTICOAGULATION CONSULT NOTE - Follow Up Consult ? ?Pharmacy Consult for Warfarin ?Indication:  CVA, rheumatic valve stenosis ? ?Allergies  ?Allergen Reactions  ? Peanut-Containing Drug Products Itching  ? Shellfish Allergy Swelling  ? ? ?Patient Measurements: ?Height: 5\' 5"  (165.1 cm) ?Weight: 80 kg (176 lb 5.9 oz) ?IBW/kg (Calculated) : 61.5 kg ? ?Vital Signs: ?Temp: 98 ?F (36.7 ?C) (03/01 8022) ?Temp Source: Oral (03/01 3361) ?BP: 128/94 (03/01 0841) ?Pulse Rate: 92 (03/01 0841) ? ?Labs: ?Recent Labs  ?  09/18/21 ?0216 09/19/21 ?0332 09/20/21 ?0209  ?LABPROT 25.0* 27.0* 29.6*  ?INR 2.3* 2.5* 2.8*  ?CREATININE 1.35*  --   --   ? ? ? ?Estimated Creatinine Clearance: 64.5 mL/min (A) (by C-G formula based on SCr of 1.35 mg/dL (H)). ? ? 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 Tenecteplase given on 2/17 at [facility]. TEE is negative for thrombus and cardiology recommends 30-day monitoring to rule out atrial fibrillation. PTA the patient is not on anticoagulation. Pharmacy is consulted to dose warfarin. ?  ?INR is 2.8, trending up from 2.5 yesterday on 7.5mg  daily for past 3 days. Eating 75-100% of meals. ?  ?Goal of Therapy:  ?INR 2-3 ?Monitor platelets by anticoagulation protocol: Yes ?  ?Plan:  ?Continue Warfarin 6 mg po daily  ?Obtain a daily PT/INR. Check CBC with AM labs. ?Monitor for signs and symptoms of bleeding ?Watch for drug-drug interactions and significant diet changes ? ?Thank you ?Luisa Hart, PharmD, BCPS ?Clinical Pharmacist ?09/20/2021 9:06 AM  ? ?Please refer to [facility] for pharmacy phone number  ? ? ? ?

## 2021-09-20 NOTE — Progress Notes (Signed)
STROKE TEAM PROGRESS NOTE  ? ?INTERVAL HISTORY ?Patient is seen in Craig Schmidt room with no family at the bedside.  No dizziness this am.  ? ?He wants to go stay with Craig Schmidt girlfriend. He may be able to get help from a non profit that finds temporary housing.  ? ?Vitals:  ? 09/19/21 2326 09/20/21 0316 09/20/21 0841 09/20/21 1156  ?BP: 104/60 109/69 (!) 128/94 (!) 86/55  ?Pulse: (!) 102 86 92 98  ?Resp: 17 15 16 14   ?Temp: 98.3 ?F (36.8 ?C) 97.8 ?F (36.6 ?C) 98 ?F (36.7 ?C) 98.4 ?F (36.9 ?C)  ?TempSrc: Oral  Oral Oral  ?SpO2: 98% 99% 100% 100%  ?Weight:      ?Height:      ? ?CBC:  ?Recent Labs  ?Lab 09/17/21 ?0134  ?WBC 7.6  ?HGB 11.3*  ?HCT 35.1*  ?MCV 89.8  ?PLT 382  ? ? ?Basic Metabolic Panel:  ?Recent Labs  ?Lab 09/18/21 ?0216  ?NA 136  ?K 4.5  ?CL 105  ?CO2 24  ?GLUCOSE 127*  ?BUN 28*  ?CREATININE 1.35*  ?CALCIUM 8.8*  ? ? ?Lipid Panel:  ?No results for input(s): CHOL, TRIG, HDL, CHOLHDL, VLDL, LDLCALC in the last 168 hours. ? ?HgbA1c:  ?No results for input(s): HGBA1C in the last 168 hours. ? ?Urine Drug Screen:  ?No results for input(s): LABOPIA, COCAINSCRNUR, LABBENZ, AMPHETMU, THCU, LABBARB in the last 168 hours.  ?Alcohol Level  ?No results for input(s): ETH in the last 168 hours. ? ?IMAGING past 24 hours ?No results found. ? ?PHYSICAL EXAM ?General:  Alert, well-developed, well-nourished patient in no acute distress ? ? ?NEURO:  ?Mental Status: AA&Ox3  ?Speech/Language: speech is without dysarthria or aphasia.   ? ?Cranial Nerves:  ?II: PERRL. Visual fields full.  ?III, IV, VI: EOMI. Eyelids elevate symmetrically.  ?V: normal sensation. ?VII: mild right facial droop on activation. ?VIII: hearing intact to voice. ?IX, X:  Phonation is normal.  ?XII: tongue is midline without fasciculations. ?Motor: 5/5 strength to LUE and LLE, 0/5 strength to RUE and 4/5 strength to RLE ?Tone: is normal and bulk is normal ?Sensation- right sided decreased sensation present ?Coordination: FTN intact on left ?Gait- deferred ? ?Right  foot swelling noted but mild. No evidence of infection. ? ?ASSESSMENT/PLAN ?Craig Schmidt is a 51 y.o. male with history of schwannoma, cocaine use and smoking presenting with sudden onset right sided weakness and slurred speech.  He was given TNK in the ED.  No LVO was seen on CTA head, so thrombectomy could not be performed.  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  Coumadin was started, and TEE completed. Patient is now awaiting placement in SNF vs. CIR. ? 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 Generalized volume loss and left cerebellopontine angle vestibular schwannoma ASPECTS 10.    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  LDL 77 ?HgbA1c 5.6 ?VTE prophylaxis - lovenox ?No antithrombotic prior to admission, now on warfarin daily and INR 2.4. ?Therapy recommendations:  SNF vs. CIR ?Disposition:  pending ? ?Hypertension ?Home meds:  none ?Stable ?Keep BP <180/105 ?Off cleviprex ?BiDil and Coreg started by cardiology ?Coreg reduced to 3.25 mg BID due to hypotension ?Long-term BP goal normotensive ? ?Hyperlipidemia ?Home meds:  none ?LDL 77, goal < 70 ?Continue atorvastatin 40 mg daily  ?Continue statin at discharge ? 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  ? 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 ? ?Tobacco abuse ?Current smoker ?Smoking cessation counseling provided ?Nicotine patch provided ?Pt is willing to quit ? ?Cocaine abuse ?Cocaine cessation education provided ?Patient is willing to quit ?Okay with Coreg per cardiology ? ?Alcohol abuse ?On FA/B1/MVI ?Watch for alcohol withdraw symptoms ?HD #11 ? ?Other Stroke Risk Factors ?Drug abuse ? ?Other Active Problems ? b/l feet/leg pain-controlled. ?con't gabapentin 300 BID. ?Melatonin 3mg  -> 5mg   ?U/S neg for DVT. ? ?Hospital day # 12 ? ?Total of 35 mins spent reviewing chart, discussion with patient and family on prognosis, Dx and plan. Discussed case with patient's nurse. Reviewed Imaging personally.  ?  ?Liliani Bobo,MD  ? ?  ? ?To contact Stroke Continuity provider, please refer to [email or web address] ?After hours, contact General Neurology  ?

## 2021-09-20 NOTE — Progress Notes (Signed)
Heart Failure Stewardship Pharmacist Progress Note ? ? ?PCP: Placey, Audrea Muscat, NP ?PCP-Cardiologist: None  ? ? ?HPI:  ?51 yo M with PMH of vestibular schwannoma, asthma, alcohol and cocaine use. He presented to the ED on 2/17 as a code stroke. Received TNK in ED. CXR with pulmonary vascular congestion and mild pulmonary edema. ECHO done on 2/17 with LVEF 25-30%, G2DD, and mildly reduced RV as well as severe mitral stenosis, mild-moderate mitral regurgitation, and moderate-severe tricuspid regurgitation. Suspected rheumatic valve disease. TEE on 2/20 with mild tricuspid regurgitation and findings concerning for rheumatic heart disease but no extensive chordal thickening.  ? ?Current HF Medications: ?Beta Blocker: carvedilol 6.25 mg BID ?ACE/ARB/ARNI: Delene Loll 24/26 mg BID ?SGLT2i: Jardiance 10 mg daily ?Other: BiDil 1 tab TID ? ?Prior to admission HF Medications: ?None ? ?Pertinent Lab Values: ?Serum creatinine 1.35, BUN 28, Potassium 4.5, Sodium 136, BNP 1570, A1c 5.6  ? ?Vital Signs: ?Weight: 176 lbs (admission weight: 176 lbs) ?Blood pressure: 90-110/60s  ?Heart rate: 90-100s  ?I/O not documented ? ?Medication Assistance / Insurance Benefits Check: ?Does the patient have prescription insurance?  No ? ?Does the patient qualify for medication assistance through manufacturers or grants?   Pending ?Eligible grants and/or patient assistance programs: Entresto/Jardiance if meets income requirements ?Medication assistance applications in progress: none  ?Medication assistance applications approved: none ?Approved medication assistance renewals will be completed by: TBD ? ?Outpatient Pharmacy:  ?Prior to admission outpatient pharmacy: [facility] ?Is the patient willing to use [facility] pharmacy at discharge? Yes ?Is the patient willing to transition their outpatient pharmacy to utilize a [facility] outpatient pharmacy?   Pending ?  ? ?Assessment: ?1. Acute systolic CHF (EF 75-17%), due to presumed NICM in the setting of  uncontrolled HTN and cocaine/alcohol use. NYHA class II symptoms. ?- Continue carvedilol 6.25 mg BID ?- Continue Entresto 24/26 mg BID ?- Consider adding spironolactone prior to discharge if BP allows ?- Continue Jardiance 10 mg daily ?- Continue BiDil 1 tab TID ?- LHC planned as outpatient - discussed with cardiology team and there are no financial concerns with insurance status  ?  ?Plan: ?1) Medication changes recommended at this time: ?- Continue current regimen ? ?2) Patient assistance: ?- Uninsured - HF TOC appt scheduled and will help with pt assistance at that time. Medicaid pending ? ?3)  Education  ?- To be completed prior to discharge ? ?Kerby Nora, PharmD, BCPS ?Heart Failure Stewardship Pharmacist ?Phone [phone] ? ? ?

## 2021-09-20 NOTE — TOC Progression Note (Signed)
Transition of Care [facility]) - Progression Note    Patient Details  Name: Craig Schmidt MRN: [id number] Date of Birth: 1971-02-17  [facility]) CM/SW [place], [place] Phone Number: 09/20/2021, 3:25 PM  Clinical Narrative:   CSW discussed patient's case with CIR Admissions, and also discussed at [place] with [place]. Patient has no bed offers for SNF and likely will not, with no payor source and positive for cocaine. Patient improving greatly, recommended for CIR but CIR not amenable to discharge patient back to the street where he was prior to admission. CSW discussed with TOC Leadership, who will discuss with CIR Leadership about a short admission for patient to get Mod I with supervision from his girlfriend on the street (his prior living situation). Otherwise, patient will be reviewed for STAR program and discharge back to the street after he is mobile enough for that to be a safe discharge.     Expected Discharge Plan: [place] Barriers to Discharge: Inadequate or no insurance  Expected Discharge Plan and Services Expected Discharge Plan: [place] Choice: [place] Living arrangements for the past 2 months: Homeless                                       Social Determinants of Health (SDOH) Interventions Food Insecurity Interventions: Intervention Not Indicated Financial Strain Interventions: Other (Comment) (HF CSW/RNCM consulted) Housing Interventions: Other (Comment) (HF CSW consulted) Transportation Interventions: Other (Comment) (HF CSW consulted) Alcohol Brief Interventions/Follow-up: Alcohol education/Brief advice  Readmission Risk Interventions No flowsheet data found.

## 2021-09-20 NOTE — Progress Notes (Signed)
Occupational Therapy Treatment ?Patient Details ?Name: Craig Schmidt ?MRN: [id number] ?DOB: 12/04/1970 ?Today's Date: 09/20/2021 ? ? ?History of present illness 51 y.o. male presented to the emergency room for sudden onset of right-sided weakness. TNKase given. 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 PHMx:schwannoma, cocaine abuse which he says he has not used over the past few weeks. ?  ?OT comments ? Patient received in supine and stating he needed to wash up. Patient was able to get to EOB with use of rails and performed bathing with mod assist due to assistance with bathing LUE and back.  Patient able to stand for peri area cleaning with mod assist to complete.  Patient eager to perform tasks as independent as possible and is eager to progress. RUE continues to be flaccid. Acute OT to continue to follow.   ? ?Recommendations for follow up therapy are one component of a multi-disciplinary discharge planning process, led by the attending physician.  Recommendations may be updated based on patient status, additional functional criteria and insurance authorization. ?   ?Follow Up Recommendations ? Skilled nursing-short term rehab (<3 hours/day)  ?  ?Assistance Recommended at Discharge Frequent or constant Supervision/Assistance  ?Patient can return home with the following ? A little help with walking and/or transfers;A lot of help with bathing/dressing/bathroom;Assistance with cooking/housework;Direct supervision/assist for medications management;Direct supervision/assist for financial management;Assist for transportation;Help with stairs or ramp for entrance ?  ?Equipment Recommendations ? Other (comment) (TBD)  ?  ?Recommendations for Other Services   ? ?  ?Precautions / Restrictions Precautions ?Precautions: Fall ?Restrictions ?Weight Bearing Restrictions:  No ?Other Position/Activity Restrictions: R UE is flaccid  ? ? ?  ? ?Mobility Bed Mobility ?Overal bed mobility: Needs Assistance ?Bed Mobility: Supine to Sit ?  ?  ?Supine to sit: HOB elevated, Supervision ?  ?  ?General bed mobility comments: able to get to EOB with use of rails ?  ? ?Transfers ?Overall transfer level: Needs assistance ?Equipment used: 1 person hand held assist, None ?Transfers: Sit to/from Stand ?Sit to Stand: Min guard ?  ?  ?  ?  ?  ?General transfer comment: transferred from EOB to recliner with hand held assist but patient insistent on no assistance ?  ?  ?Balance Overall balance assessment: Needs assistance ?Sitting-balance support: Feet supported, No upper extremity supported ?Sitting balance-Leahy Scale: Fair ?Sitting balance - Comments: able to perform self care tasks seated on EOB ?  ?Standing balance support: Single extremity supported, Bilateral upper extremity supported, During functional activity ?Standing balance-Leahy Scale: Fair ?Standing balance comment: able to stand to perform peri area cleaning from EOB using rail to assist with balance when needed ?  ?  ?  ?  ?  ?  ?  ?  ?  ?  ?  ?   ? ?ADL either performed or assessed with clinical judgement  ? ?ADL Overall ADL's : Needs assistance/impaired ?  ?  ?Grooming: Wash/dry hands;Wash/dry face;Supervision/safety;Sitting ?Grooming Details (indicate cue type and reason): performed seated on EOB ?Upper Body Bathing: Moderate assistance;Bed level ?Upper Body Bathing Details (indicate cue type and reason): required assistance to bathe LUE and to raise RUE ?Lower Body Bathing: Moderate assistance ?Lower Body Bathing Details (indicate cue type and reason): stood to perform peri area cleaning from EOB ?Upper Body Dressing : Moderate assistance ?Upper Body Dressing Details (indicate cue type and reason): EOB ?  ?  ?  ?  ?  ?  ?  ?  ?  ?  General ADL Comments: performed self care tasks seated on EOB ?  ? ?Extremity/Trunk Assessment Upper  Extremity Assessment ?RUE Deficits / Details: Able to perform scapular retraction, shoulder extension, and elbow flexion (with gravity eleminated). Unable to shrugg shoudler. No sensation at hand and decreased sensation at vetral forearm. Sensation returning at midforearm ?RUE Sensation: decreased light touch ?RUE Coordination: decreased fine motor;decreased gross motor ?  ?  ?  ?  ?  ? ?Vision   ?  ?  ?Perception   ?  ?Praxis   ?  ? ?Cognition Arousal/Alertness: Awake/alert ?Behavior During Therapy: [facility] for tasks assessed/performed ?Overall Cognitive Status: Within Functional Limits for tasks assessed ?Area of Impairment: Awareness, Safety/judgement, Problem solving ?  ?  ?  ?  ?  ?  ?  ?  ?  ?  ?  ?Following Commands: Follows one step commands consistently ?Safety/Judgement: Decreased awareness of safety, Decreased awareness of deficits ?Awareness: Emergent ?Problem Solving: Difficulty sequencing, Requires verbal cues, Requires tactile cues ?General Comments: motivated to perform tasks independently ?  ?  ?   ?Exercises   ? ?  ?Shoulder Instructions   ? ? ?  ?General Comments    ? ? ?Pertinent Vitals/ Pain       Pain Assessment ?Pain Assessment: No/denies pain ?Faces Pain Scale: No hurt ?Pain Intervention(s): Monitored during session ? ?Home Living   ?  ?  ?  ?  ?  ?  ?  ?  ?  ?  ?  ?  ?  ?  ?  ?  ?  ?  ? ?  ?Prior Functioning/Environment    ?  ?  ?  ?   ? ?Frequency ? Min 2X/week  ? ? ? ? ?  ?Progress Toward Goals ? ?OT Goals(current goals can now be found in the care plan section) ? Progress towards OT goals: Progressing toward goals ? ?Acute Rehab OT Goals ?Patient Stated Goal: get better ?OT Goal Formulation: With patient ?Time For Goal Achievement: 09/23/21 ?Potential to Achieve Goals: Good ?ADL Goals ?Pt Will Perform Grooming: with set-up;with supervision;sitting ?Pt Will Perform Upper Body Bathing: with min assist;sitting ?Pt Will Perform Lower Body Bathing: with min assist;sit to/from stand ?Pt Will  Transfer to Toilet: with min assist;with transfer board;bedside commode ?Pt/caregiver will Perform Home Exercise Program: Increased ROM;Right Upper extremity;With written HEP provided ?Additional ADL Goal #1: Pt will be S in and OOB for basic ADLs ?Additional ADL Goal #2: Continue to assess vision  ?Plan Discharge plan remains appropriate   ? ?Co-evaluation ? ? ?   ?  ?  ?  ?  ? ?  ?AM-PAC OT "6 Clicks" Daily Activity     ?Outcome Measure ? ? Help from another person eating meals?: A Little ?Help from another person taking care of personal grooming?: A Little ?Help from another person toileting, which includes using toliet, bedpan, or urinal?: A Lot ?Help from another person bathing (including washing, rinsing, drying)?: A Lot ?Help from another person to put on and taking off regular upper body clothing?: A Lot ?Help from another person to put on and taking off regular lower body clothing?: A Lot ?6 Click Score: 14 ? ?  ?End of Session   ? ?OT Visit Diagnosis: Other abnormalities of gait and mobility (R26.89);Muscle weakness (generalized) (M62.81);Other symptoms and signs involving cognitive function;Hemiplegia and hemiparesis;Pain ?Hemiplegia - Right/Left: Right ?Hemiplegia - dominant/non-dominant: Dominant ?  ?Activity Tolerance Patient tolerated treatment well ?  ?Patient Left in chair;with call bell/phone  within reach ?  ?Nurse Communication Mobility status ?  ? ?   ? ?Time: 0156-1537 ?OT Time Calculation (min): 20 min ? ?Charges: OT General Charges ?$OT Visit: 1 Visit ?OT Treatments ?$Self Care/Home Management : 8-22 mins ? ?Lodema Hong, OTA ?Acute Rehabilitation Services  ?Pager [phone] ?Office [phone] ? ? ?[place] ?09/20/2021, 9:36 AM ?

## 2021-09-21 ENCOUNTER — Telehealth (HOSPITAL_COMMUNITY): Payer: Self-pay | Admitting: Cardiology

## 2021-09-21 LAB — CBC
HCT: 38.9 % — ABNORMAL LOW (ref 39.0–52.0)
Hemoglobin: 12.6 g/dL — ABNORMAL LOW (ref 13.0–17.0)
MCH: 28.9 pg (ref 26.0–34.0)
MCHC: 32.4 g/dL (ref 30.0–36.0)
MCV: 89.2 fL (ref 80.0–100.0)
Platelets: 474 10*3/uL — ABNORMAL HIGH (ref 150–400)
RBC: 4.36 MIL/uL (ref 4.22–5.81)
RDW: 13.9 % (ref 11.5–15.5)
WBC: 9 10*3/uL (ref 4.0–10.5)
nRBC: 0 % (ref 0.0–0.2)

## 2021-09-21 LAB — PROTIME-INR
INR: 3 — ABNORMAL HIGH (ref 0.8–1.2)
Prothrombin Time: 31.1 seconds — ABNORMAL HIGH (ref 11.4–15.2)

## 2021-09-21 MED ORDER — WARFARIN SODIUM 2.5 MG PO TABS
2.5000 mg | ORAL_TABLET | Freq: Once | ORAL | Status: AC
  Administered 2021-09-21: 2.5 mg via ORAL
  Filled 2021-09-21: qty 1

## 2021-09-21 MED ORDER — GABAPENTIN 300 MG PO CAPS
300.0000 mg | ORAL_CAPSULE | Freq: Two times a day (BID) | ORAL | Status: DC
  Administered 2021-09-22 – 2021-10-01 (×19): 300 mg via ORAL
  Filled 2021-09-21 (×19): qty 1

## 2021-09-21 NOTE — Telephone Encounter (Signed)
Pt called to notify clinic staff he is currently a patient @[facility] , please advise ?

## 2021-09-21 NOTE — Progress Notes (Signed)
ANTICOAGULATION CONSULT NOTE - Follow Up Consult ? ?Pharmacy Consult for Warfarin ?Indication:  CVA, rheumatic valve stenosis ? ?Allergies  ?Allergen Reactions  ? Peanut-Containing Drug Products Itching  ? Shellfish Allergy Swelling  ? ? ?Patient Measurements: ?Height: 5\' 5"  (165.1 cm) ?Weight: 80 kg (176 lb 5.9 oz) ?IBW/kg (Calculated) : 61.5 kg ? ?Vital Signs: ?Temp: 98 ?F (36.7 ?C) (03/02 0700) ?Temp Source: Oral (03/02 0700) ?BP: 125/80 (03/02 0700) ?Pulse Rate: 99 (03/02 0700) ? ?Labs: ?Recent Labs  ?  09/19/21 ?0332 09/20/21 ?0209 09/21/21 ?0339  ?HGB  --   --  12.6*  ?HCT  --   --  38.9*  ?PLT  --   --  474*  ?LABPROT 27.0* 29.6* 31.1*  ?INR 2.5* 2.8* 3.0*  ? ? ? ?Estimated Creatinine Clearance: 64.5 mL/min (A) (by C-G formula based on SCr of 1.35 mg/dL (H)). ? ? 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 Tenecteplase given on 2/17 at [facility]. TEE is negative for thrombus and cardiology recommends 30-day monitoring to rule out atrial fibrillation. PTA the patient is not on anticoagulation. Pharmacy is consulted to dose warfarin. ? ?INR continues to trend up ?  ?Goal of Therapy:  ?INR 2-3 ?Monitor platelets by anticoagulation protocol: Yes ?  ?Plan:  ?Warfarin 2.5 mg po x 1 today ?Obtain a daily PT/INR. Check CBC with AM labs. ?Monitor for signs and symptoms of bleeding ?Watch for drug-drug interactions and significant diet changes ? ?Thank you ?Luisa Hart, PharmD, BCPS ?Clinical Pharmacist ?09/21/2021 9:45 AM  ? ?Please refer to [facility] for pharmacy phone number  ? ? ? ?

## 2021-09-21 NOTE — Telephone Encounter (Signed)
Noted ?Pt is already scheduled for HFU on 3/7 ?

## 2021-09-21 NOTE — Progress Notes (Signed)
STROKE TEAM PROGRESS NOTE  ? ?INTERVAL HISTORY ?Patient is seen in his room with no family at the bedside.  No dizziness this am.  ? ? ?Vitals:  ? 09/21/21 0354 09/21/21 0700 09/21/21 1137 09/21/21 1600  ?BP: 104/66 125/80 109/90 (!) 95/54  ?Pulse: 99 99 (!) 106 100  ?Resp: 18 18 18    ?Temp: 97.9 ?F (36.6 ?C) 98 ?F (36.7 ?C) 97.8 ?F (36.6 ?C) 97.7 ?F (36.5 ?C)  ?TempSrc: Oral Oral Oral Oral  ?SpO2: 100% 100% 100% 100%  ?Weight:      ?Height:      ? ?CBC:  ?Recent Labs  ?Lab 09/17/21 ?0134 09/21/21 ?0339  ?WBC 7.6 9.0  ?HGB 11.3* 12.6*  ?HCT 35.1* 38.9*  ?MCV 89.8 89.2  ?PLT 382 474*  ? ? ?Basic Metabolic Panel:  ?Recent Labs  ?Lab 09/18/21 ?0216  ?NA 136  ?K 4.5  ?CL 105  ?CO2 24  ?GLUCOSE 127*  ?BUN 28*  ?CREATININE 1.35*  ?CALCIUM 8.8*  ? ? ?Lipid Panel:  ?No results for input(s): CHOL, TRIG, HDL, CHOLHDL, VLDL, LDLCALC in the last 168 hours. ? ?HgbA1c:  ?No results for input(s): HGBA1C in the last 168 hours. ? ?Urine Drug Screen:  ?No results for input(s): LABOPIA, COCAINSCRNUR, LABBENZ, AMPHETMU, THCU, LABBARB in the last 168 hours.  ?Alcohol Level  ?No results for input(s): ETH in the last 168 hours. ? ?IMAGING past 24 hours ?No results found. ? ?PHYSICAL EXAM ?General:  Alert, well-developed, well-nourished patient in no acute distress ? ? ?NEURO:  ?Mental Status: AA&Ox3  ?Speech/Language: speech is without dysarthria or aphasia.   ? ?Cranial Nerves:  ?II: PERRL. Visual fields full.  ?III, IV, VI: EOMI. Eyelids elevate symmetrically.  ?V: normal sensation. ?VII: mild right facial droop on activation. ?VIII: hearing intact to voice. ?IX, X:  Phonation is normal.  ?XII: tongue is midline without fasciculations. ?Motor: 5/5 strength to LUE and LLE, 0/5 strength to RUE and 4/5 strength to RLE ?Tone: is normal and bulk is normal ?Sensation- right sided decreased sensation present ?Coordination: FTN intact on left ?Gait- deferred ? ?Right foot swelling noted but mild. No evidence of  infection. ? ?ASSESSMENT/PLAN ?Mr. HASAAN RADDE is a 51 y.o. male with history of schwannoma, cocaine use and smoking presenting with sudden onset right sided weakness and slurred speech.  He was given TNK in the ED.  No LVO was seen on CTA head, so thrombectomy could not be performed.  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  Coumadin was started, and TEE completed. Patient is now awaiting placement in CIR with recent PT eval today.  ? 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 Generalized volume loss and left cerebellopontine angle vestibular schwannoma ASPECTS 10.    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 ?Therapy recommendations:   CIR ?Disposition:  pending ? ?Hypertension ?Home meds:  none ?Stable ?Keep BP <180/105 ?Off cleviprex ?BiDil and Coreg started by cardiology ?Coreg reduced to 3.25 mg BID due to hypotension ?Long-term BP goal normotensive ? ?Hyperlipidemia ?Home meds:  none ?LDL 77, goal < 70 ?Continue atorvastatin 40 mg daily  ?Continue statin at discharge ? 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  ? 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 ? ?Tobacco abuse ?Current smoker ?Smoking cessation counseling provided ?Nicotine patch provided ?Pt is willing to quit ? ?Cocaine abuse ?Cocaine cessation education provided ?Patient is willing to quit ?Okay with Coreg per cardiology ? ?Alcohol abuse ?On FA/B1/MVI ?Watch for alcohol withdraw symptoms ?HD #11 ? ?Other Stroke Risk Factors ?Drug abuse ? ?Other Active Problems ? b/l feet/leg pain-controlled. ?con't gabapentin 300 BID. ?Melatonin 3mg  -> 5mg   ?U/S neg for DVT. ? ?Hospital day # 13 ? ?Total of 35 mins spent reviewing chart, discussion with patient and family on prognosis, Dx and plan. Discussed case with patient's nurse. Reviewed Imaging personally.  ?  ?Riley Hallum,MD  ? ?  ? ?To contact Stroke Continuity provider, please refer to [email or web address] ?After hours, contact General Neurology  ?

## 2021-09-21 NOTE — Progress Notes (Incomplete)
Heart Failure Stewardship Pharmacist Progress Note   PCP: Placey, Audrea Muscat, NP PCP-Cardiologist: None    HPI:  51 yo M with PMH of vestibular schwannoma, asthma, alcohol and cocaine use. He presented to the ED on 2/17 as a code stroke. Received TNK in ED. CXR with pulmonary vascular congestion and mild pulmonary edema. ECHO done on 2/17 with LVEF 25-30%, G2DD, and mildly reduced RV as well as severe mitral stenosis, mild-moderate mitral regurgitation, and moderate-severe tricuspid regurgitation. Suspected rheumatic valve disease. TEE on 2/20 with mild tricuspid regurgitation and findings concerning for rheumatic heart disease but no extensive chordal thickening.   Current HF Medications: Beta Blocker: carvedilol 6.25 mg BID ACE/ARB/ARNI: Entresto 24/26 mg BID SGLT2i: Jardiance 10 mg daily Other: BiDil 1 tab TID  Prior to admission HF Medications: None  Pertinent Lab Values: Serum creatinine 1.35, BUN 28, Potassium 4.5, Sodium 136, BNP 1570, A1c 5.6   Vital Signs: Weight: 176 lbs (admission weight: 176 lbs) Blood pressure: 90-110/60s  Heart rate: 90-100s  I/O not documented  Medication Assistance / Insurance Benefits Check: Does the patient have prescription insurance?  No  Does the patient qualify for medication assistance through manufacturers or grants?   Pending Eligible grants and/or patient assistance programs: Entresto/Jardiance if meets income requirements Medication assistance applications in progress: none  Medication assistance applications approved: none Approved medication assistance renewals will be completed by: TBD  Outpatient Pharmacy:  Prior to admission outpatient pharmacy: [facility] Is the patient willing to use [facility] pharmacy at discharge? Yes Is the patient willing to transition their outpatient pharmacy to utilize a [facility] outpatient pharmacy?   Pending    Assessment: 1. Acute systolic CHF (EF 16-10%), due to presumed NICM in the setting of  uncontrolled HTN and cocaine/alcohol use. NYHA class II symptoms. - Continue carvedilol 6.25 mg BID - Continue Entresto 24/26 mg BID - Consider adding spironolactone prior to discharge if BP allows - Continue Jardiance 10 mg daily - Continue BiDil 1 tab TID - [facility] planned as outpatient - discussed with cardiology team and there are no financial concerns with insurance status    Plan: 1) Medication changes recommended at this time: - Continue current regimen  2) Patient assistance: - Uninsured - HF TOC appt scheduled and will help with pt assistance at that time. Medicaid pending  3)  Education  - To be completed prior to discharge  Kerby Nora, PharmD, BCPS Heart Failure Stewardship Pharmacist Phone [phone]

## 2021-09-21 NOTE — Progress Notes (Signed)
Physical Therapy Treatment ?Patient Details ?Name: Craig Schmidt ?MRN: [id number] ?DOB: 04/19/1971 ?Today's Date: 09/21/2021 ? ? ?History of Present Illness 51 y.o. male presented to the emergency room for sudden onset of right-sided weakness. TNKase given. 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 PHMx:schwannoma, cocaine abuse which he says he has not used over the past few weeks. ? ?  ?PT Comments  ? ? Progressing well.  Still would benefit from the intensity of AIR.  Emphasis today on gait stability/speed with balance challenge and safe negotiation of stairs. ?   ?Recommendations for follow up therapy are one component of a multi-disciplinary discharge planning process, led by the attending physician.  Recommendations may be updated based on patient status, additional functional criteria and insurance authorization. ? ?Follow Up Recommendations ? Acute inpatient rehab (3hours/day) ?  ?  ?Assistance Recommended at Discharge Frequent or constant Supervision/Assistance  ?Patient can return home with the following Assistance with cooking/housework;Assist for transportation;Help with stairs or ramp for entrance;A little help with walking and/or transfers ?  ?Equipment Recommendations ? Other (comment) (TBD)  ?  ?Recommendations for Other Services   ? ? ?  ?Precautions / Restrictions Precautions ?Precautions: Fall ?Restrictions ?Other Position/Activity Restrictions: R UE is flaccid  ?  ? ?Mobility ? Bed Mobility ?Overal bed mobility: Needs Assistance ?Bed Mobility: Supine to Sit, Sit to Supine ?  ?  ?Supine to sit: Min guard ?Sit to supine: Min guard ?  ?General bed mobility comments: light use of the rails ?  ? ?Transfers ?Overall transfer level: Needs assistance ?Equipment used: None ?Transfers: Sit to/from Stand ?Sit to Stand: Min guard ?  ?  ?  ?  ?  ?General  transfer comment: cued for hand placement/safety ?  ? ?Ambulation/Gait ?Ambulation/Gait assistance: Min guard ?Gait Distance (Feet): 200 Feet (x2) ?Assistive device: None ?Gait Pattern/deviations: Step-to pattern, Step-through pattern ?Gait velocity: decreased ?Gait velocity interpretation: <1.8 ft/sec, indicate of risk for recurrent falls ?  ?General Gait Details: Slow low amplitude steps, mildly paretic on the right, but no overt instability.  Pt able to speed up to cuing though not significantly.  Worked on stability with min to moderate challenge to balance including abrupt turns, backing up, scanning ? ? ?Stairs ?Stairs: Yes ?Stairs assistance: Min assist ?Stair Management: One rail Left, Step to pattern, Alternating pattern, Forwards ?Number of Stairs: 4 ?General stair comments: step to pattern most safe.  ascending easier and safer than descending.  Pt reliant on the rail. ? ? ?Wheelchair Mobility ?  ? ?Modified Rankin (Stroke Patients Only) ?Modified Rankin (Stroke Patients Only) ?Modified Rankin: Moderate disability ? ? ?  ?Balance Overall balance assessment: Needs assistance ?Sitting-balance support: Feet supported, No upper extremity supported ?Sitting balance-Leahy Scale: Fair ?  ?  ?Standing balance support: Single extremity supported, Bilateral upper extremity supported, During functional activity, No upper extremity supported ?Standing balance-Leahy Scale: Fair ?  ?  ?  ?  ?  ?  ?  ?  ?  ?  ?  ?  ?  ? ?  ?Cognition Arousal/Alertness: Awake/alert ?Behavior During Therapy: [facility] for tasks assessed/performed ?Overall Cognitive Status: Within Functional Limits for tasks assessed ?  ?  ?  ?  ?  ?  ?  ?  ?  ?  ?  ?  ?  ?  ?  ?  ?  ?  ?  ? ?  ?Exercises   ? ?  ?  General Comments General comments (skin integrity, edema, etc.): pt's arm is subluxed 1 finger width and flails around during gait.  pt is concerned about need for a sling. ?  ?  ? ?Pertinent Vitals/Pain Pain Assessment ?Faces Pain Scale: No hurt ?Pain  Intervention(s): Monitored during session  ? ? ?Home Living   ?  ?  ?  ?  ?  ?  ?  ?  ?  ?   ?  ?Prior Function    ?  ?  ?   ? ?PT Goals (current goals can now be found in the care plan section) Acute Rehab PT Goals ?Patient Stated Goal: get the R arm working, but if not a good sling. ?PT Goal Formulation: With patient ?Time For Goal Achievement: 09/23/21 ?Potential to Achieve Goals: Fair ?Progress towards PT goals: Progressing toward goals ? ?  ?Frequency ? ? ? Min 4X/week ? ? ? ?  ?PT Plan Current plan remains appropriate  ? ? ?Co-evaluation   ?  ?  ?  ?  ? ?  ?AM-PAC PT "6 Clicks" Mobility   ?Outcome Measure ? Help needed turning from your back to your side while in a flat bed without using bedrails?: A Little ?Help needed moving from lying on your back to sitting on the side of a flat bed without using bedrails?: A Little ?Help needed moving to and from a bed to a chair (including a wheelchair)?: A Little ?Help needed standing up from a chair using your arms (e.g., wheelchair or bedside chair)?: A Little ?Help needed to walk in hospital room?: A Little ?Help needed climbing 3-5 steps with a railing? : A Little ?6 Click Score: 18 ? ?  ?End of Session   ?Activity Tolerance: Patient tolerated treatment well ?Patient left: in bed;with call bell/phone within reach;with bed alarm set ?Nurse Communication: Mobility status ?PT Visit Diagnosis: Other abnormalities of gait and mobility (R26.89);Muscle weakness (generalized) (M62.81);Hemiplegia and hemiparesis ?Hemiplegia - Right/Left: Right ?Hemiplegia - dominant/non-dominant: Dominant ?Hemiplegia - caused by: Cerebral infarction ?  ? ? ?Time: 4098-1191 ?PT Time Calculation (min) (ACUTE ONLY): 24 min ? ?Charges:  $Gait Training: 8-22 mins ?$Therapeutic Activity: 8-22 mins          ?          ? ?09/21/2021 ? ?Ginger Carne., PT ?Acute Rehabilitation Services ?[phone]  (pager) ?[phone]  (office) ? ? ?Craig Schmidt ?09/21/2021, 3:48 PM ? ?

## 2021-09-22 LAB — CBC
HCT: 40.9 % (ref 39.0–52.0)
Hemoglobin: 13.1 g/dL (ref 13.0–17.0)
MCH: 28.4 pg (ref 26.0–34.0)
MCHC: 32 g/dL (ref 30.0–36.0)
MCV: 88.7 fL (ref 80.0–100.0)
Platelets: 440 10*3/uL — ABNORMAL HIGH (ref 150–400)
RBC: 4.61 MIL/uL (ref 4.22–5.81)
RDW: 13.9 % (ref 11.5–15.5)
WBC: 8.1 10*3/uL (ref 4.0–10.5)
nRBC: 0 % (ref 0.0–0.2)

## 2021-09-22 LAB — BASIC METABOLIC PANEL
Anion gap: 8 (ref 5–15)
BUN: 30 mg/dL — ABNORMAL HIGH (ref 6–20)
CO2: 23 mmol/L (ref 22–32)
Calcium: 9.3 mg/dL (ref 8.9–10.3)
Chloride: 106 mmol/L (ref 98–111)
Creatinine, Ser: 1.3 mg/dL — ABNORMAL HIGH (ref 0.61–1.24)
GFR, Estimated: >60 mL/min (ref >60)
Glucose, Bld: 122 mg/dL — ABNORMAL HIGH (ref 70–99)
Potassium: 3.9 mmol/L (ref 3.5–5.1)
Sodium: 137 mmol/L (ref 135–145)

## 2021-09-22 LAB — PROTIME-INR
INR: 2.4 — ABNORMAL HIGH (ref 0.8–1.2)
Prothrombin Time: 26.2 seconds — ABNORMAL HIGH (ref 11.4–15.2)

## 2021-09-22 MED ORDER — WARFARIN SODIUM 7.5 MG PO TABS
7.5000 mg | ORAL_TABLET | ORAL | Status: DC
  Administered 2021-09-23 – 2021-10-03 (×7): 7.5 mg via ORAL
  Filled 2021-09-22 (×7): qty 1

## 2021-09-22 MED ORDER — WARFARIN SODIUM 5 MG PO TABS
5.0000 mg | ORAL_TABLET | ORAL | Status: AC
  Administered 2021-09-22: 5 mg via ORAL
  Filled 2021-09-22: qty 1

## 2021-09-22 NOTE — Progress Notes (Signed)
STROKE TEAM PROGRESS NOTE  ? ?INTERVAL HISTORY ?Working with PT this morning. No complaints.  ? ? ?Vitals:  ? 09/22/21 0350 09/22/21 0751 09/22/21 1139 09/22/21 1608  ?BP: 113/73 106/67 118/79 119/86  ?Pulse: (!) 108 (!) 105 99 (!) 107  ?Resp: 18  20 20   ?Temp:  98.7 ?F (37.1 ?C) 98.3 ?F (36.8 ?C) 98.4 ?F (36.9 ?C)  ?TempSrc:  Oral Oral Oral  ?SpO2: 99% 99%  100%  ?Weight:      ?Height:      ? ?CBC:  ?Recent Labs  ?Lab 09/21/21 ?9628 09/22/21 ?0518  ?WBC 9.0 8.1  ?HGB 12.6* 13.1  ?HCT 38.9* 40.9  ?MCV 89.2 88.7  ?PLT 474* 440*  ? ? ?Basic Metabolic Panel:  ?Recent Labs  ?Lab 09/18/21 ?0216 09/22/21 ?0518  ?NA 136 137  ?K 4.5 3.9  ?CL 105 106  ?CO2 24 23  ?GLUCOSE 127* 122*  ?BUN 28* 30*  ?CREATININE 1.35* 1.30*  ?CALCIUM 8.8* 9.3  ? ? ?Lipid Panel:  ?No results for input(s): CHOL, TRIG, HDL, CHOLHDL, VLDL, LDLCALC in the last 168 hours. ? ?HgbA1c:  ?No results for input(s): HGBA1C in the last 168 hours. ? ?Urine Drug Screen:  ?No results for input(s): LABOPIA, COCAINSCRNUR, LABBENZ, AMPHETMU, THCU, LABBARB in the last 168 hours.  ?Alcohol Level  ?No results for input(s): ETH in the last 168 hours. ? ?IMAGING past 24 hours ?No results found. ? ?PHYSICAL EXAM ?General:  Alert, awake. Sitting up in chair, working with PT. ? ? ?NEURO:  ?Mental Status: AA&Ox3  ?Speech/Language: speech is without dysarthria or aphasia.   ? ?Cranial Nerves:  ?II: PERRL. Visual fields full.  ?III, IV, VI: EOMI. Eyelids elevate symmetrically.  ?V: normal sensation. ?VII: mild right facial droop on activation. ?VIII: hearing intact to voice. ?IX, X:  Phonation is normal.  ?XII: tongue is midline without fasciculations. ?Motor: 5/5 strength to LUE and LLE, 0/5 strength to RUE and 4/5 strength to RLE ?Tone: is normal and bulk is normal ?Sensation- right sided decreased sensation present ?Coordination: FTN intact on left ?Gait- deferred ? ?Right foot swelling noted but mild. No evidence of infection. ? ?ASSESSMENT/PLAN ?[name] is  a 51 y.o. male with history of schwannoma, cocaine use and smoking presenting with sudden onset right sided weakness and slurred speech.  He was given TNK in the ED.  No LVO was seen on CTA head, so thrombectomy could not be performed.  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  Coumadin was started, and TEE completed.  ? ?Working with PT today. Labs today stable. Awaiting placement. ? 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 Generalized volume loss and left cerebellopontine angle vestibular schwannoma ASPECTS 10.    ?CTA head & neck negative for LVO ?MRI  patchy multifocal acute ischemic infarcts in left greater than right cerebral hemispheres and cerebellum, chronic microvascular ischemic disease, 1.4 cm left vestibular schwannoma ?2D Echo EF 25-30%, global left ventricular hypokinesis, grade 2 diastolic dysfunction, rheumatic mitral valve with moderate mitral stenosis, moderate to severe tricuspid regurgitation, dilated right and left atria, no atrial level shunt ?TEE showed no LV thrombus, LAA spontaneous contrast, mild MR and severe MS, questionable rheumatic etiology ?Cardiology recommend 30 day monitoring to rule out afib as outpt ?LDL 77 ?HgbA1c 5.6 ?VTE prophylaxis - lovenox ?No antithrombotic prior to admission, now on warfarin daily  and INR 2.4. ?Therapy recommendations:   CIR ?Disposition:  pending ? ?Hypertension ?Home meds:  none ?Stable ?Keep BP <180/105 ?Off cleviprex ?BiDil and Coreg started by cardiology ?Coreg reduced to 3.25 mg BID due to hypotension ?Long-term BP goal normotensive ? ?Hyperlipidemia ?Home meds:  none ?LDL 77, goal < 70 ?Continue atorvastatin 40 mg daily  ?Continue statin at discharge ? 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  ? 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 ? ?Tobacco abuse ?Current smoker ?Smoking cessation counseling provided ?Nicotine patch provided ?Pt is willing to quit ? ?Cocaine abuse ?Cocaine cessation education provided ?Patient is willing to quit ?Okay with Coreg per cardiology ? ?Alcohol abuse ?On FA/B1/MVI ?Watch for alcohol withdraw symptoms ?HD #11 ? ?Other Stroke Risk Factors ?Drug abuse ? ?Other Active Problems ? b/l feet/leg pain-controlled. ?con't gabapentin 300 BID. ?Melatonin 3mg  -> 5mg   ?U/S neg for DVT. ? ?Hospital day # 14 ? ?Total of 35 mins spent reviewing chart, discussion with patient and family on prognosis, Dx and plan. Discussed case with patient's nurse. Reviewed Imaging personally.  ?  ?[name],MD  ? ?  ? ?To contact Stroke Continuity provider, please refer to [email or web address] ?After hours, contact General Neurology  ?

## 2021-09-22 NOTE — Progress Notes (Signed)
Physical Therapy Treatment ?Patient Details ?Name: Craig Schmidt ?MRN: [id number] ?DOB: September 15, 1970 ?Today's Date: 09/22/2021 ? ? ?History of Present Illness 51 y.o. male presented to the emergency room for sudden onset of right-sided weakness. TNKase given. 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 PHMx:schwannoma, cocaine abuse which he says he has not used over the past few weeks. ? ?  ?PT Comments  ? ? Pt up in bathroom with urine on self and floor upon PT arrival, attempting to clean it up by himself. Pt required assist for clean up and changing clothes. Pt ambulatory around the unit with mod cuing for RLE progression, PT used gait belt to create RUE sling during gait. CIR remains appropraite. ?   ?Recommendations for follow up therapy are one component of a multi-disciplinary discharge planning process, led by the attending physician.  Recommendations may be updated based on patient status, additional functional criteria and insurance authorization. ? ?Follow Up Recommendations ? Acute inpatient rehab (3hours/day) ?  ?  ?Assistance Recommended at Discharge Frequent or constant Supervision/Assistance  ?Patient can return home with the following Assistance with cooking/housework;Assist for transportation;Help with stairs or ramp for entrance;A little help with walking and/or transfers ?  ?Equipment Recommendations ? Other (comment) (TBD)  ?  ?Recommendations for Other Services   ? ? ?  ?Precautions / Restrictions Precautions ?Precautions: Fall ?Restrictions ?Weight Bearing Restrictions: No ?Other Position/Activity Restrictions: R UE is flaccid  ?  ? ?Mobility ? Bed Mobility ?Overal bed mobility: Needs Assistance ?Bed Mobility: Sit to Supine ?  ?  ?  ?  ?  ?General bed mobility comments: up in bathroom ?  ? ?Transfers ?Overall transfer level: Needs  assistance ?Equipment used: None ?Transfers: Sit to/from Stand ?Sit to Stand: Min guard ?  ?  ?  ?  ?  ?  ?  ? ?Ambulation/Gait ?Ambulation/Gait assistance: Min guard ?Gait Distance (Feet): 220 Feet ?Assistive device: None ?Gait Pattern/deviations: Step-through pattern, Decreased stride length, Decreased step length - right, Decreased dorsiflexion - right, Trunk flexed ?Gait velocity: decr ?  ?  ?General Gait Details: cues for upright posture, increasing swing through on RLE, and increasing R foot clearance. ? ? ?Stairs ?  ?  ?  ?  ?  ? ? ?Wheelchair Mobility ?  ? ?Modified Rankin (Stroke Patients Only) ?Modified Rankin (Stroke Patients Only) ?Pre-Morbid Rankin Score: No symptoms ?Modified Rankin: Moderately severe disability ? ? ?  ?Balance Overall balance assessment: Needs assistance ?Sitting-balance support: Feet supported, No upper extremity supported ?Sitting balance-Leahy Scale: Fair ?  ?  ?Standing balance support: Single extremity supported, Bilateral upper extremity supported, During functional activity, No upper extremity supported ?Standing balance-Leahy Scale: Fair ?  ?  ?  ?  ?  ?  ?  ?  ?  ?  ?  ?  ?  ? ?  ?Cognition Arousal/Alertness: Awake/alert ?Behavior During Therapy: [facility] for tasks assessed/performed ?Overall Cognitive Status: Within Functional Limits for tasks assessed ?Area of Impairment: Awareness, Safety/judgement, Problem solving ?  ?  ?  ?  ?  ?  ?  ?  ?  ?  ?  ?Following Commands: Follows one step commands consistently ?Safety/Judgement: Decreased awareness of safety, Decreased awareness of deficits ?Awareness: Emergent ?Problem Solving: Difficulty sequencing, Requires verbal cues, Requires tactile cues ?General Comments: lacks insight into deficits, requires repeated cuing for attend to RLE clearance and RUE ?  ?  ? ?  ?  Exercises Other Exercises ?Other Exercises: forward step up x20 bilat ?Other Exercises: lateral step up x5 RLE with UE support ? ?  ?General Comments   ?  ?  ? ?Pertinent  Vitals/Pain Pain Assessment ?Pain Assessment: Faces ?Faces Pain Scale: Hurts a little bit ?Pain Location: RLE, with exercise ?Pain Descriptors / Indicators: Sore, Tightness ?Pain Intervention(s): Monitored during session, Limited activity within patient's tolerance, Repositioned  ? ? ?Home Living   ?  ?  ?  ?  ?  ?  ?  ?  ?  ?   ?  ?Prior Function    ?  ?  ?   ? ?PT Goals (current goals can now be found in the care plan section) Acute Rehab PT Goals ?Patient Stated Goal: get the R arm working, but if not a good sling. ?PT Goal Formulation: With patient ?Time For Goal Achievement: 09/23/21 ?Potential to Achieve Goals: Fair ?Progress towards PT goals: Progressing toward goals ? ?  ?Frequency ? ? ? Min 4X/week ? ? ? ?  ?PT Plan Current plan remains appropriate  ? ? ?Co-evaluation   ?  ?  ?  ?  ? ?  ?AM-PAC PT "6 Clicks" Mobility   ?Outcome Measure ? Help needed turning from your back to your side while in a flat bed without using bedrails?: A Little ?Help needed moving from lying on your back to sitting on the side of a flat bed without using bedrails?: A Little ?Help needed moving to and from a bed to a chair (including a wheelchair)?: A Little ?Help needed standing up from a chair using your arms (e.g., wheelchair or bedside chair)?: A Little ?Help needed to walk in hospital room?: A Little ?Help needed climbing 3-5 steps with a railing? : A Little ?6 Click Score: 18 ? ?  ?End of Session   ?Activity Tolerance: Patient tolerated treatment well ?Patient left: in bed;with call bell/phone within reach;with bed alarm set ?Nurse Communication: Mobility status ?PT Visit Diagnosis: Other abnormalities of gait and mobility (R26.89);Muscle weakness (generalized) (M62.81);Hemiplegia and hemiparesis ?Hemiplegia - Right/Left: Right ?Hemiplegia - dominant/non-dominant: Dominant ?Hemiplegia - caused by: Cerebral infarction ?  ? ? ?Time: 1325-1350 ?PT Time Calculation (min) (ACUTE ONLY): 25 min ? ?Charges:  $Gait Training: 8-22  mins ?$Therapeutic Activity: 8-22 mins          ?          ?Stacie Glaze, PT DPT ?Acute Rehabilitation Services ?Pager [phone]  ?Office [phone] ? ? ?[place] ?09/22/2021, 3:02 PM ? ?

## 2021-09-22 NOTE — Progress Notes (Signed)
Occupational Therapy Treatment ?Patient Details ?Name: Craig Schmidt ?MRN: [id number] ?DOB: 03-27-1971 ?Today's Date: 09/22/2021 ? ? ?History of present illness 51 y.o. male presented to the emergency room for sudden onset of right-sided weakness. TNKase given. 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 PHMx:schwannoma, cocaine abuse which he says he has not used over the past few weeks. ?  ?OT comments ? Patient received in recliner and was wearing compression stocking only. Patient instructed on donning socks seated in recliner and required mod assist. Patient instructed in AAROM exercises with table slides to address shoulder and elbow ROM with muscle tapping performed to facilitate movement.  Patient instructed in SROM exercises to perform to assist with increasing AROM.  Acute OT to continue to follow.   ? ?Recommendations for follow up therapy are one component of a multi-disciplinary discharge planning process, led by the attending physician.  Recommendations may be updated based on patient status, additional functional criteria and insurance authorization. ?   ?Follow Up Recommendations ? Skilled nursing-short term rehab (<3 hours/day)  ?  ?Assistance Recommended at Discharge Frequent or constant Supervision/Assistance  ?Patient can return home with the following ? A little help with walking and/or transfers;A lot of help with bathing/dressing/bathroom;Assistance with cooking/housework;Direct supervision/assist for medications management;Direct supervision/assist for financial management;Assist for transportation;Help with stairs or ramp for entrance ?  ?Equipment Recommendations ? Other (comment) (TBD)  ?  ?Recommendations for Other Services   ? ?  ?Precautions / Restrictions Precautions ?Precautions: Fall ?Restrictions ?Weight Bearing Restrictions: No   ? ? ?  ? ?Mobility Bed Mobility ?Overal bed mobility: Needs Assistance ?  ?  ?  ?  ?  ?  ?General bed mobility comments: up in recliner ?  ? ?Transfers ?Overall transfer level: Needs assistance ?  ?  ?  ?  ?  ?  ?  ?  ?  ?  ?  ?Balance   ?  ?  ?  ?  ?  ?  ?  ?  ?  ?  ?  ?  ?  ?  ?  ?  ?  ?  ?   ? ?ADL either performed or assessed with clinical judgement  ? ?ADL Overall ADL's : Needs assistance/impaired ?  ?  ?  ?  ?  ?  ?  ?  ?  ?  ?Lower Body Dressing: Moderate assistance;Sitting/lateral leans ?Lower Body Dressing Details (indicate cue type and reason): donned socks while sitting with patient able to pull up socks from mid foot ?  ?  ?  ?  ?  ?  ?  ?General ADL Comments: addressed LB dressing with donning socks ?  ? ?Extremity/Trunk Assessment Upper Extremity Assessment ?RUE Deficits / Details: Able to perform scapular retraction, shoulder extension, and elbow flexion (with gravity eleminated). Unable to shrugg shoudler. No sensation at hand and decreased sensation at vetral forearm. Sensation returning at midforearm ?RUE Sensation: decreased light touch ?RUE Coordination: decreased fine motor;decreased gross motor ?  ?  ?  ?  ?  ? ?Vision   ?  ?  ?Perception   ?  ?Praxis   ?  ? ?Cognition Arousal/Alertness: Awake/alert ?Behavior During Therapy: [facility] for tasks assessed/performed ?Overall Cognitive Status: Within Functional Limits for tasks assessed ?Area of Impairment: Awareness, Safety/judgement, Problem solving ?  ?  ?  ?  ?  ?  ?  ?  ?  ?  ?  ?  Following Commands: Follows one step commands consistently ?Safety/Judgement: Decreased awareness of safety, Decreased awareness of deficits ?Awareness: Emergent ?Problem Solving: Difficulty sequencing, Requires verbal cues, Requires tactile cues ?General Comments: states he has noticed more active movement at RUE ?  ?  ?   ?Exercises Exercises: General Upper Extremity ?General Exercises - Upper Extremity ?Shoulder Flexion: AAROM, Right, 10 reps, PROM, Seated ?Shoulder  Extension: PROM, AAROM, Right, 10 reps, Seated ?Shoulder ABduction: PROM, AAROM, Right, 10 reps, Seated ?Shoulder ADduction: PROM, AAROM, Right, 10 reps, Seated ?Elbow Flexion: PROM, AAROM, Right, 10 reps, Seated ?Elbow Extension: PROM, AAROM, Right, 15 reps, Seated ?Wrist Flexion: PROM, AAROM, Right, 10 reps, Seated ?Wrist Extension: PROM, AAROM, Right, 10 reps, Seated ? ?  ?Shoulder Instructions   ? ? ?  ?General Comments    ? ? ?Pertinent Vitals/ Pain       Pain Assessment ?Pain Assessment: Faces ?Faces Pain Scale: No hurt ?Pain Intervention(s): Monitored during session ? ?Home Living   ?  ?  ?  ?  ?  ?  ?  ?  ?  ?  ?  ?  ?  ?  ?  ?  ?  ?  ? ?  ?Prior Functioning/Environment    ?  ?  ?  ?   ? ?Frequency ? Min 2X/week  ? ? ? ? ?  ?Progress Toward Goals ? ?OT Goals(current goals can now be found in the care plan section) ? Progress towards OT goals: Progressing toward goals ? ?Acute Rehab OT Goals ?Patient Stated Goal: get better ?OT Goal Formulation: With patient ?Time For Goal Achievement: 09/23/21 ?Potential to Achieve Goals: Good ?ADL Goals ?Pt Will Perform Grooming: with set-up;with supervision;sitting ?Pt Will Perform Upper Body Bathing: with min assist;sitting ?Pt Will Perform Lower Body Bathing: with min assist;sit to/from stand ?Pt Will Transfer to Toilet: with min assist;with transfer board;bedside commode ?Pt/caregiver will Perform Home Exercise Program: Increased ROM;Right Upper extremity;With written HEP provided ?Additional ADL Goal #1: Pt will be S in and OOB for basic ADLs ?Additional ADL Goal #2: Continue to assess vision  ?Plan Discharge plan remains appropriate   ? ?Co-evaluation ? ? ?   ?  ?  ?  ?  ? ?  ?AM-PAC OT "6 Clicks" Daily Activity     ?Outcome Measure ? ? Help from another person eating meals?: A Little ?Help from another person taking care of personal grooming?: A Little ?Help from another person toileting, which includes using toliet, bedpan, or urinal?: A Lot ?Help from another  person bathing (including washing, rinsing, drying)?: A Lot ?Help from another person to put on and taking off regular upper body clothing?: A Lot ?Help from another person to put on and taking off regular lower body clothing?: A Lot ?6 Click Score: 14 ? ?  ?End of Session   ? ?OT Visit Diagnosis: Other abnormalities of gait and mobility (R26.89);Muscle weakness (generalized) (M62.81);Other symptoms and signs involving cognitive function;Hemiplegia and hemiparesis;Pain ?Hemiplegia - Right/Left: Right ?Hemiplegia - dominant/non-dominant: Dominant ?  ?Activity Tolerance Patient tolerated treatment well ?  ?Patient Left in chair;with call bell/phone within reach ?  ?Nurse Communication Mobility status ?  ? ?   ? ?Time: 1610-9604 ?OT Time Calculation (min): 26 min ? ?Charges: OT General Charges ?$OT Visit: 1 Visit ?OT Treatments ?$Self Care/Home Management : 8-22 mins ?$Therapeutic Exercise: 8-22 mins ? ?Lodema Hong, OTA ?Acute Rehabilitation Services  ?Pager [phone] ?Office [phone] ? ? ?[place] ?09/22/2021, 1:51 PM ?

## 2021-09-22 NOTE — Progress Notes (Signed)
Heart Failure Stewardship Pharmacist Progress Note ? ? ?PCP: Placey, Audrea Muscat, NP ?PCP-Cardiologist: None  ? ? ?HPI:  ?51 yo M with PMH of vestibular schwannoma, asthma, alcohol and cocaine use. He presented to the ED on 2/17 as a code stroke. Received TNK in ED. CXR with pulmonary vascular congestion and mild pulmonary edema. ECHO done on 2/17 with LVEF 25-30%, G2DD, and mildly reduced RV as well as severe mitral stenosis, mild-moderate mitral regurgitation, and moderate-severe tricuspid regurgitation. Suspected rheumatic valve disease. TEE on 2/20 with mild tricuspid regurgitation and findings concerning for rheumatic heart disease but no extensive chordal thickening.  ? ?Current HF Medications: ?Beta Blocker: carvedilol 3.125 mg BID ?ACE/ARB/ARNI: Delene Loll 24/26 mg BID ?SGLT2i: Jardiance 10 mg daily ?Other: BiDil 1 tab TID ? ?Prior to admission HF Medications: ?None ? ?Pertinent Lab Values: ?Serum creatinine 1.30, BUN 30, Potassium 3.9, Sodium 137, BNP 1570, A1c 5.6  ? ?Vital Signs: ?Weight: 176 lbs (admission weight: 176 lbs) ?Blood pressure: 110/80s  ?Heart rate: 90-100s  ?I/O not documented ? ?Medication Assistance / Insurance Benefits Check: ?Does the patient have prescription insurance?  No ? ?Does the patient qualify for medication assistance through manufacturers or grants?   Pending ?Eligible grants and/or patient assistance programs: Entresto/Jardiance if meets income requirements ?Medication assistance applications in progress: none  ?Medication assistance applications approved: none ?Approved medication assistance renewals will be completed by: TBD ? ?Outpatient Pharmacy:  ?Prior to admission outpatient pharmacy: [facility] ?Is the patient willing to use [facility] pharmacy at discharge? Yes ?Is the patient willing to transition their outpatient pharmacy to utilize a [facility] outpatient pharmacy?   Pending ?  ? ?Assessment: ?1. Acute systolic CHF (EF 08-02%), due to presumed NICM in the setting of  uncontrolled HTN and cocaine/alcohol use. NYHA class II symptoms. ?- Continue carvedilol 3.125 mg BID ?- Continue Entresto 24/26 mg BID ?- Consider adding spironolactone prior to discharge if BP allows ?- Continue Jardiance 10 mg daily ?- Continue BiDil 1 tab TID ?- LHC planned as outpatient - discussed with cardiology team and there are no financial concerns with insurance status  ?  ?Plan: ?1) Medication changes recommended at this time: ?- Continue current regimen ? ?2) Patient assistance: ?- Uninsured - HF TOC appt scheduled and will help with pt assistance at that time. Medicaid pending ? ?3)  Education  ?- To be completed prior to discharge ? ?Kerby Nora, PharmD, BCPS ?Heart Failure Stewardship Pharmacist ?Phone [phone] ? ? ?

## 2021-09-22 NOTE — Progress Notes (Addendum)
ANTICOAGULATION CONSULT NOTE - Follow Up Consult ? ?Pharmacy Consult for Warfarin ?Indication:  CVA, rheumatic valve stenosis ? ?Allergies  ?Allergen Reactions  ? Peanut-Containing Drug Products Itching  ? Shellfish Allergy Swelling  ? ? ?Patient Measurements: ?Height: 5\' 5"  (165.1 cm) ?Weight: 80 kg (176 lb 5.9 oz) ?IBW/kg (Calculated) : 61.5 kg ? ?Vital Signs: ?Temp: 98.7 ?F (37.1 ?C) (03/03 0751) ?Temp Source: Oral (03/03 0751) ?BP: 106/67 (03/03 0751) ?Pulse Rate: 105 (03/03 0751) ? ?Labs: ?Recent Labs  ?  09/20/21 ?0209 09/21/21 ?2563 09/22/21 ?0518  ?HGB  --  12.6* 13.1  ?HCT  --  38.9* 40.9  ?PLT  --  474* 440*  ?LABPROT 29.6* 31.1* 26.2*  ?INR 2.8* 3.0* 2.4*  ?CREATININE  --   --  1.30*  ? ? ? ?Estimated Creatinine Clearance: 67 mL/min (A) (by C-G formula based on SCr of 1.3 mg/dL (H)). ? ? 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 Tenecteplase given on 2/17 at [facility]. TEE is negative for thrombus and cardiology recommends 30-day monitoring to rule out atrial fibrillation. PTA the patient is not on anticoagulation. Pharmacy is consulted to dose warfarin. ? ?INR 2.4 today ?  ?Goal of Therapy:  ?INR 2-3 ?Monitor platelets by anticoagulation protocol: Yes ?  ?Plan:  ?Warfarin 5 mg MWF, 7.5 mg other days ?Obtain a daily PT/INR. Check CBC with AM labs. ?Monitor for signs and symptoms of bleeding ?Watch for drug-drug interactions and significant diet changes ? ?Thank you ?Anette Guarneri, PharmD ?09/22/2021 8:58 AM  ? ?Please refer to [facility] for pharmacy phone number  ? ? ? ?

## 2021-09-23 LAB — PROTIME-INR
INR: 2.5 — ABNORMAL HIGH (ref 0.8–1.2)
Prothrombin Time: 26.9 seconds — ABNORMAL HIGH (ref 11.4–15.2)

## 2021-09-23 MED ORDER — WARFARIN SODIUM 5 MG PO TABS: 5.0000 mg | ORAL_TABLET | ORAL | Status: DC

## 2021-09-23 NOTE — Progress Notes (Addendum)
STROKE TEAM PROGRESS NOTE  ? ?INTERVAL HISTORY ?Patient has been hemodynamically stable and has a stable neurological exam.  He has had no acute events overnight and continues to work with PT and OT. ? ? ?Vitals:  ? 09/22/21 2331 09/23/21 0413 09/23/21 0839 09/23/21 1110  ?BP: 111/64 106/72 127/88 97/69  ?Pulse: 99 (!) 102 93 97  ?Resp: '16 15 20 16  '$ ?Temp: 97.9 ?F (36.6 ?C) 98.4 ?F (36.9 ?C) 97.7 ?F (36.5 ?C) 97.6 ?F (36.4 ?C)  ?TempSrc: Oral Oral Oral Oral  ?SpO2: 100% 100%  100%  ?Weight:      ?Height:      ? ?CBC:  ?Recent Labs  ?Lab 09/21/21 ?9470 09/22/21 ?0518  ?WBC 9.0 8.1  ?HGB 12.6* 13.1  ?HCT 38.9* 40.9  ?MCV 89.2 88.7  ?PLT 474* 440*  ? ? ?Basic Metabolic Panel:  ?Recent Labs  ?Lab 09/18/21 ?0216 09/22/21 ?0518  ?NA 136 137  ?K 4.5 3.9  ?CL 105 106  ?CO2 24 23  ?GLUCOSE 127* 122*  ?BUN 28* 30*  ?CREATININE 1.35* 1.30*  ?CALCIUM 8.8* 9.3  ? ? ?Lipid Panel:  ?No results for input(s): CHOL, TRIG, HDL, CHOLHDL, VLDL, LDLCALC in the last 168 hours. ? ?HgbA1c:  ?No results for input(s): HGBA1C in the last 168 hours. ? ?Urine Drug Screen:  ?No results for input(s): LABOPIA, COCAINSCRNUR, LABBENZ, AMPHETMU, THCU, LABBARB in the last 168 hours.  ?Alcohol Level  ?No results for input(s): ETH in the last 168 hours. ? ?IMAGING past 24 hours ?No results found. ? ?PHYSICAL EXAM ?General:  Alert, awake. Sitting up in chair, working with PT. ? ? ?NEURO:  ?Mental Status: AA&Ox3  ?Speech/Language: speech is without dysarthria or aphasia.   ? ?Cranial Nerves:  ?II: PERRL. Visual fields full.  ?III, IV, VI: EOMI. Eyelids elevate symmetrically.  ?V: normal sensation on left, mildly decreased on right. ?VII: mild right facial droop on activation. ?VIII: hearing intact to voice. ?IX, X:  Phonation is normal.  ?XII: tongue is midline without fasciculations. ?Motor: 5/5 strength to LUE and LLE, 2/5 strength to RUE and 4/5 strength to RLE ?Tone: is normal and bulk is normal ?Sensation- right sided decreased sensation  present ?Coordination: FTN intact on left ?Gait- deferred ? ?Right foot swelling noted but mild. No evidence of infection. ? ?ASSESSMENT/PLAN ?Mr. NASRI BOAKYE is a 51 y.o. male with history of schwannoma, cocaine use and smoking presenting with sudden onset right sided weakness and slurred speech.  He was given TNK in the ED.  No LVO was seen on CTA head, so thrombectomy could not be performed.  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  Coumadin was started, and TEE completed.  ? ?Working with PT today. Labs today stable. Awaiting placement. ? 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 Generalized volume loss and left cerebellopontine angle vestibular schwannoma ASPECTS 10.    ?CTA head & neck negative for LVO ?MRI  patchy multifocal acute ischemic infarcts in left greater than right cerebral hemispheres and cerebellum, chronic microvascular ischemic disease, 1.4 cm left vestibular schwannoma ?2D Echo EF 25-30%, global left ventricular hypokinesis, grade 2 diastolic dysfunction, rheumatic mitral valve with moderate mitral stenosis, moderate to severe tricuspid regurgitation, dilated right and left atria, no atrial level shunt ?TEE showed no LV thrombus, LAA spontaneous contrast, mild MR and severe MS, questionable rheumatic etiology ?Cardiology recommend  30 day monitoring to rule out afib as outpt ?LDL 77 ?HgbA1c 5.6 ?VTE prophylaxis - lovenox ?No antithrombotic prior to admission, now on warfarin daily and INR 2.5. ?Therapy recommendations:   CIR ?Disposition:  pending ? ?Hypertension ?Home meds:  none ?Stable ?Keep BP <180/105 ?Off cleviprex ?BiDil and Coreg started by cardiology ?Coreg reduced to 3.25 mg BID due to hypotension ?Long-term BP goal normotensive ? ?Hyperlipidemia ?Home meds:  none ?LDL 77, goal < 70 ?Continue atorvastatin 40 mg daily   ?Continue statin at discharge ? 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  ? 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 ? ?Tobacco abuse ?Current smoker ?Smoking cessation counseling provided ?Nicotine patch provided ?Pt is willing to quit ? ?Cocaine abuse ?Cocaine cessation education provided ?Patient is willing to quit ?Okay with Coreg per cardiology ? ?Alcohol abuse ?On FA/B1/MVI ?Watch for alcohol withdraw symptoms ?HD #15 ? ?Other Stroke Risk Factors ?Drug abuse ? ?Other Active Problems ? b/l feet/leg pain-controlled. ?con't gabapentin 300 BID. ?Melatonin '3mg'$  -> '5mg'$   ?U/S neg for DVT. ? ?Hospital day # 15 ? ?Robbins , MSN, AGACNP-BC ?[facility] ?See Amion for schedule and pager information ?09/23/2021 12:09 PM ?   ?ATTENDING NOTE: ?I reviewed above note and agree with the assessment and plan. Pt was seen and examined.  ? ?No acute event overnight, neuro stable.  Continue Coumadin, INR 2.5.  Pending placement. ? ?For detailed assessment and plan, please refer to above as I have made changes wherever appropriate.  ? ?[name], MD PhD ?Stroke Neurology ?09/23/2021 ?7:49 PM ? ? ?To contact Stroke Continuity provider, please refer to [email or web address] ?After hours, contact General Neurology  ?

## 2021-09-23 NOTE — Progress Notes (Signed)
ANTICOAGULATION CONSULT NOTE - Follow Up Consult ? ?Pharmacy Consult for Warfarin ?Indication:  CVA, rheumatic valve stenosis ? ?Allergies  ?Allergen Reactions  ? Peanut-Containing Drug Products Itching  ? Shellfish Allergy Swelling  ? ? ?Patient Measurements: ?Height: '5\' 5"'$  (165.1 cm) ?Weight: 80 kg (176 lb 5.9 oz) ?IBW/kg (Calculated) : 61.5 kg ? ?Vital Signs: ?Temp: 98.4 ?F (36.9 ?C) (03/04 0413) ?Temp Source: Oral (03/04 0413) ?BP: 106/72 (03/04 0413) ?Pulse Rate: 102 (03/04 0413) ? ?Labs: ?Recent Labs  ?  09/21/21 ?5732 09/22/21 ?0518 09/23/21 ?2025  ?HGB 12.6* 13.1  --   ?HCT 38.9* 40.9  --   ?PLT 474* 440*  --   ?LABPROT 31.1* 26.2* 26.9*  ?INR 3.0* 2.4* 2.5*  ?CREATININE  --  1.30*  --   ? ? ? ?Estimated Creatinine Clearance: 67 mL/min (A) (by C-G formula based on SCr of 1.3 mg/dL (H)). ? ? 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 Tenecteplase given on 2/17 at [place]. TEE is negative for thrombus and cardiology recommends 30-day monitoring to rule out atrial fibrillation. PTA the patient is not on anticoagulation. Pharmacy is consulted to dose warfarin. ? ?INR 2.5 today, Hgb 13.1, Plt 440 ?  ?Goal of Therapy:  ?INR 2-3 ?Monitor platelets by anticoagulation protocol: Yes ?  ?Plan:  ?Warfarin 5 mg MWF, 7.5 mg other days ?Obtain a daily PT/INR. Check CBC with AM labs. ?Monitor for signs and symptoms of bleeding ?Watch for drug-drug interactions and significant diet changes ? ?Lestine Box, PharmD ?PGY2 Infectious Diseases Pharmacy Resident ?  ?Please check [email or web address] for unit-specific pharmacy phone numbers   ? ? ? ?

## 2021-09-23 NOTE — Progress Notes (Signed)
Physical Therapy Treatment ?Patient Details ?Name: Craig Schmidt ?MRN: [id number] ?DOB: 05-04-1971 ?Today's Date: 09/23/2021 ? ? ?History of Present Illness 51 y.o. male presented to the emergency room for sudden onset of right-sided weakness. TNKase given. 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 PHMx:schwannoma, cocaine abuse which he says he has not used over the past few weeks. ? ?  ?PT Comments  ? ? Pt supine in bed on arrival. Pt required min guard to min assistance overall.  Made sling from pillow case as slings are on backorder.  Would benefit from trip up to rehab to utilize nu-step with hand ace wrapped to R hand grip. He is very motivated.   ?  ?Recommendations for follow up therapy are one component of a multi-disciplinary discharge planning process, led by the attending physician.  Recommendations may be updated based on patient status, additional functional criteria and insurance authorization. ? ?Follow Up Recommendations ? Acute inpatient rehab (3hours/day) ?  ?  ?Assistance Recommended at Discharge Frequent or constant Supervision/Assistance  ?Patient can return home with the following Assistance with cooking/housework;Assist for transportation;Help with stairs or ramp for entrance;A little help with walking and/or transfers ?  ?Equipment Recommendations ? Other (comment) (TBD)  ?  ?Recommendations for Other Services   ? ? ?  ?Precautions / Restrictions Precautions ?Precautions: Fall ?Restrictions ?Weight Bearing Restrictions: No ?Other Position/Activity Restrictions: R UE is flaccid  ?  ? ?Mobility ? Bed Mobility ?Overal bed mobility: Needs Assistance ?Bed Mobility: Supine to Sit ?  ?  ?Supine to sit: Supervision ?  ?  ?General bed mobility comments: Cues for RUE management to protect limb ?  ? ?Transfers ?Overall transfer level: Needs  assistance ?Equipment used: None ?Transfers: Sit to/from Stand ?Sit to Stand: Min guard ?  ?  ?  ?  ?  ?General transfer comment: cued for hand placement/safety ?  ? ?Ambulation/Gait ?Ambulation/Gait assistance: Min guard ?Gait Distance (Feet): 220 Feet ?Assistive device: None ?Gait Pattern/deviations: Step-through pattern, Decreased stride length, Decreased step length - right, Decreased dorsiflexion - right, Trunk flexed ?Gait velocity: decr ?  ?  ?General Gait Details: cues for upright posture, increasing swing through on RLE, and increasing R foot clearance. ? ? ?Stairs ?Stairs: Yes ?Stairs assistance: Min assist ?Stair Management: No rails ?Number of Stairs: 6 ?General stair comments: Practice without rail to simulate community environment.  Mild LOB posterior required min assistance to correct.  Right response is delayed ? ? ?Wheelchair Mobility ?  ? ?Modified Rankin (Stroke Patients Only) ?Modified Rankin (Stroke Patients Only) ?Pre-Morbid Rankin Score: No symptoms ?Modified Rankin: Moderately severe disability ? ? ?  ?Balance   ?  ?  ?  ?  ?  ?  ?  ?  ?  ?  ?  ?  ?  ?  ?  ?  ?  ?  ?  ? ?  ?Cognition Arousal/Alertness: Awake/alert ?Behavior During Therapy: [facility] for tasks assessed/performed ?Overall Cognitive Status: Within Functional Limits for tasks assessed ?  ?  ?  ?  ?  ?  ?  ?  ?  ?  ?  ?  ?  ?  ?  ?  ?General Comments: lacks insight into deficits, requires repeated cuing for attend to RLE clearance and RUE ?  ?  ? ?  ?Exercises   ? ?  ?General Comments   ?  ?  ? ?  Pertinent Vitals/Pain Pain Assessment ?Pain Assessment: Faces ?Pain Score: 0-No pain  ? ? ?Home Living   ?  ?  ?  ?  ?  ?  ?  ?  ?  ?   ?  ?Prior Function    ?  ?  ?   ? ?PT Goals (current goals can now be found in the care plan section) Acute Rehab PT Goals ?Patient Stated Goal: get the R arm working, but if not a good sling. ?Potential to Achieve Goals: Fair ?Progress towards PT goals: Progressing toward goals ? ?  ?Frequency ? ? ? Min  4X/week ? ? ? ?  ?PT Plan Current plan remains appropriate  ? ? ?Co-evaluation   ?  ?  ?  ?  ? ?  ?AM-PAC PT "6 Clicks" Mobility   ?Outcome Measure ? Help needed turning from your back to your side while in a flat bed without using bedrails?: A Little ?Help needed moving from lying on your back to sitting on the side of a flat bed without using bedrails?: A Little ?Help needed moving to and from a bed to a chair (including a wheelchair)?: A Little ?Help needed standing up from a chair using your arms (e.g., wheelchair or bedside chair)?: A Little ?Help needed to walk in hospital room?: A Little ?Help needed climbing 3-5 steps with a railing? : A Little ?6 Click Score: 18 ? ?  ?End of Session Equipment Utilized During Treatment: Gait belt ?Activity Tolerance: Patient tolerated treatment well ?Patient left: in bed;with call bell/phone within reach;with bed alarm set ?Nurse Communication: Mobility status ?PT Visit Diagnosis: Other abnormalities of gait and mobility (R26.89);Muscle weakness (generalized) (M62.81);Hemiplegia and hemiparesis ?Hemiplegia - Right/Left: Right ?Hemiplegia - dominant/non-dominant: Dominant ?Hemiplegia - caused by: Cerebral infarction ?  ? ? ?Time: 1241-1315 ?PT Time Calculation (min) (ACUTE ONLY): 34 min ? ?Charges:  $Gait Training: 23-37 mins          ?          ? ?Norberta Stobaugh R. , PTA ?Acute Rehabilitation Services ?Pager [phone] ?Office [phone] ? ? ? ?Craig Schmidt ?09/23/2021, 1:23 PM ? ?

## 2021-09-24 LAB — PROTIME-INR
INR: 2.3 — ABNORMAL HIGH (ref 0.8–1.2)
Prothrombin Time: 25.2 seconds — ABNORMAL HIGH (ref 11.4–15.2)

## 2021-09-24 NOTE — Progress Notes (Signed)
ANTICOAGULATION CONSULT NOTE - Follow Up Consult ? ?Pharmacy Consult for Warfarin ?Indication:  CVA, rheumatic valve stenosis ? ?Allergies  ?Allergen Reactions  ? Peanut-Containing Drug Products Itching  ? Shellfish Allergy Swelling  ? ? ?Patient Measurements: ?Height: '5\' 5"'$  (165.1 cm) ?Weight: 80 kg (176 lb 5.9 oz) ?IBW/kg (Calculated) : 61.5 kg ? ?Vital Signs: ?Temp: 98.2 ?F (36.8 ?C) (03/05 0343) ?Temp Source: Oral (03/05 0343) ?BP: 109/62 (03/05 0343) ?Pulse Rate: 109 (03/05 0343) ? ?Labs: ?Recent Labs  ?  09/22/21 ?0518 09/23/21 ?2956 09/24/21 ?0409  ?HGB 13.1  --   --   ?HCT 40.9  --   --   ?PLT 440*  --   --   ?LABPROT 26.2* 26.9* 25.2*  ?INR 2.4* 2.5* 2.3*  ?CREATININE 1.30*  --   --   ? ? ? ?Estimated Creatinine Clearance: 67 mL/min (A) (by C-G formula based on SCr of 1.3 mg/dL (H)). ? ? ?Assessment: ?50 yo male presents with likely embolic CVA and moderate/severe rheumatic mitral stenosis. Tenecteplase given on 2/17 at [place]. TEE is negative for thrombus and cardiology recommends 30-day monitoring to rule out atrial fibrillation. PTA the patient is not on anticoagulation. Pharmacy is consulted to dose warfarin. ? ?INR 2.3 today, Hgb 13.1, Plt 440 ?  ?Goal of Therapy:  ?INR 2-3 ?Monitor platelets by anticoagulation protocol: Yes ?  ?Plan:  ?Warfarin 5 mg MWF, 7.5 mg other days ?Monitor daily INR and CBC ?Monitor for signs and symptoms of bleeding ?Watch for drug-drug interactions and significant diet changes ? ?Lestine Box, PharmD ?PGY2 Infectious Diseases Pharmacy Resident ?  ?Please check [email or web address] for unit-specific pharmacy phone numbers   ? ? ? ?

## 2021-09-24 NOTE — Progress Notes (Addendum)
STROKE TEAM PROGRESS NOTE  ? ?INTERVAL HISTORY ?Patient has been hemodynamically stable and has a stable neurological exam.  He has had no acute events overnight.  He is able to break gravity with his right arm today. ? ? ?Vitals:  ? 09/23/21 2058 09/23/21 2359 09/24/21 2725 09/24/21 0748  ?BP: (!) 127/96 104/65 109/62 120/83  ?Pulse: 99 (!) 104 (!) 109   ?Resp: '17 17 16 17  '$ ?Temp: 98.2 ?F (36.8 ?C) 98.4 ?F (36.9 ?C) 98.2 ?F (36.8 ?C)   ?TempSrc: Oral Oral Oral   ?SpO2: 99% 100% 98% 99%  ?Weight:      ?Height:      ? ?CBC:  ?Recent Labs  ?Lab 09/21/21 ?3664 09/22/21 ?0518  ?WBC 9.0 8.1  ?HGB 12.6* 13.1  ?HCT 38.9* 40.9  ?MCV 89.2 88.7  ?PLT 474* 440*  ? ? ?Basic Metabolic Panel:  ?Recent Labs  ?Lab 09/18/21 ?0216 09/22/21 ?0518  ?NA 136 137  ?K 4.5 3.9  ?CL 105 106  ?CO2 24 23  ?GLUCOSE 127* 122*  ?BUN 28* 30*  ?CREATININE 1.35* 1.30*  ?CALCIUM 8.8* 9.3  ? ? ?Lipid Panel:  ?No results for input(s): CHOL, TRIG, HDL, CHOLHDL, VLDL, LDLCALC in the last 168 hours. ? ?HgbA1c:  ?No results for input(s): HGBA1C in the last 168 hours. ? ?Urine Drug Screen:  ?No results for input(s): LABOPIA, COCAINSCRNUR, LABBENZ, AMPHETMU, THCU, LABBARB in the last 168 hours.  ?Alcohol Level  ?No results for input(s): ETH in the last 168 hours. ? ?IMAGING past 24 hours ?No results found. ? ?PHYSICAL EXAM ?General:  Alert, awake.  ? ? ?NEURO:  ?Mental Status: AA&Ox3  ?Speech/Language: speech is without dysarthria or aphasia.   ? ?Cranial Nerves:  ?II: PERRL. Visual fields full.  ?III, IV, VI: EOMI. Eyelids elevate symmetrically.  ?V: normal sensation on left, mildly decreased on right. ?VII: mild right facial droop on activation. ?VIII: hearing intact to voice. ?IX, X:  Phonation is normal.  ?XII: tongue is midline without fasciculations. ?Motor: 5/5 strength to LUE and LLE, 3/5 strength to RUE and 4/5 strength to RLE ?Tone: is normal and bulk is normal ?Sensation- right sided decreased sensation present ?Coordination: FTN intact on  left ?Gait- deferred ? ?Right foot swelling noted but mild. No evidence of infection. ? ?ASSESSMENT/PLAN ?Mr. HARDEN BRAMER is a 51 y.o. male with history of schwannoma, cocaine use and smoking presenting with sudden onset right sided weakness and slurred speech.  He was given TNK in the ED.  No LVO was seen on CTA head, so thrombectomy could not be performed.  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  Coumadin was started, and TEE completed.  ? ?Working with PT today. Labs today stable. Awaiting placement. ? 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 Generalized volume loss and left cerebellopontine angle vestibular schwannoma ASPECTS 10.    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 ?Therapy recommendations:   CIR ?Disposition:  pending ? ?Hypertension ?Home meds:  none ?Stable ?Keep BP <180/105 ?Off cleviprex ?BiDil and Coreg started by cardiology ?Coreg reduced to 3.25 mg BID due to hypotension ?Long-term BP goal normotensive ? ?Hyperlipidemia ?Home meds:  none ?LDL 77, goal < 70 ?Continue atorvastatin 40 mg daily  ?Continue statin at  discharge ? 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  ? 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 ? ?Tobacco abuse ?Current smoker ?Smoking cessation counseling provided ?Nicotine patch provided ?Pt is willing to quit ? ?Cocaine abuse ?Cocaine cessation education provided ?Patient is willing to quit ?Okay with Coreg per cardiology ? ?Alcohol abuse ?On FA/B1/MVI ?Watch for alcohol withdraw symptoms ?HD #15 ? ?Other Stroke Risk Factors ?Drug abuse ? ?Other Active Problems ? b/l feet/leg pain-controlled. ?con't gabapentin 300 BID. ?Melatonin '3mg'$  -> '5mg'$   ?U/S neg for DVT. ? ?Hospital day # 16 ? ?Gosper , MSN, AGACNP-BC ?[facility] ?See Amion for schedule and pager information ?09/24/2021 12:55 PM ? ?ATTENDING NOTE: ?I reviewed above note and agree with the assessment and plan. Pt was seen and examined.  ? ?No acute event overnight.  Neuro stable, right arm strength improving gradually.  INR 2.3, pharmacy on board.  Still pending placement. ? ?For detailed assessment and plan, please refer to above as I have made changes wherever appropriate.  ? ?[name], MD PhD ?Stroke Neurology ?09/24/2021 ?4:26 PM ? ? ? ?To contact Stroke Continuity provider, please refer to [email or web address] ?After hours, contact General Neurology  ?

## 2021-09-25 ENCOUNTER — Encounter (HOSPITAL_COMMUNITY): Payer: Self-pay

## 2021-09-25 LAB — CBC
HCT: 39.7 % (ref 39.0–52.0)
Hemoglobin: 12.9 g/dL — ABNORMAL LOW (ref 13.0–17.0)
MCH: 28.4 pg (ref 26.0–34.0)
MCHC: 32.5 g/dL (ref 30.0–36.0)
MCV: 87.4 fL (ref 80.0–100.0)
Platelets: 497 10*3/uL — ABNORMAL HIGH (ref 150–400)
RBC: 4.54 MIL/uL (ref 4.22–5.81)
RDW: 14.1 % (ref 11.5–15.5)
WBC: 7.9 10*3/uL (ref 4.0–10.5)
nRBC: 0 % (ref 0.0–0.2)

## 2021-09-25 LAB — PROTIME-INR
INR: 2.2 — ABNORMAL HIGH (ref 0.8–1.2)
Prothrombin Time: 24.1 seconds — ABNORMAL HIGH (ref 11.4–15.2)

## 2021-09-25 MED ORDER — WARFARIN SODIUM 5 MG PO TABS
5.0000 mg | ORAL_TABLET | ORAL | Status: DC
Start: 2021-09-27 — End: 2021-10-03
  Administered 2021-09-27 – 2021-10-02 (×3): 5 mg via ORAL
  Filled 2021-09-25 (×3): qty 1

## 2021-09-25 MED ORDER — NICOTINE 14 MG/24HR TD PT24
14.0000 mg | MEDICATED_PATCH | Freq: Every day | TRANSDERMAL | Status: DC
  Administered 2021-09-25 – 2021-10-03 (×9): 14 mg via TRANSDERMAL
  Filled 2021-09-25 (×9): qty 1

## 2021-09-25 MED ORDER — WARFARIN SODIUM 7.5 MG PO TABS
7.5000 mg | ORAL_TABLET | Freq: Once | ORAL | Status: AC
  Administered 2021-09-25: 7.5 mg via ORAL
  Filled 2021-09-25: qty 1

## 2021-09-25 NOTE — Progress Notes (Signed)
Occupational Therapy Treatment ?Patient Details ?Name: Craig Schmidt ?MRN: [id number] ?DOB: 1971/05/15 ?Today's Date: 09/25/2021 ? ? ?History of present illness 51 y.o. male presented to the emergency room for sudden onset of right-sided weakness. TNKase given. 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 PHMx:schwannoma, cocaine abuse which he says he has not used over the past few weeks. ?  ?OT comments ? Mirko is making functional progress. Pt is motivated to participate in RUE exercises, AAROM completed at the hand wrist and elbow with great tolerance and return demonstration. Pt R shoulder noted to be sublux. Order placed for GivMohr sling for all OOB tasks, Dr. Erlinda Hong and RN made aware. Pt ambulated to the bathroom and required close min guard and cues for RUE management. D/c remains appropriate. OT to continue to follow acutely.  ? ?Recommendations for follow up therapy are one component of a multi-disciplinary discharge planning process, led by the attending physician.  Recommendations may be updated based on patient status, additional functional criteria and insurance authorization. ?   ?Follow Up Recommendations ? Skilled nursing-short term rehab (<3 hours/day)  ?  ?Assistance Recommended at Discharge Frequent or constant Supervision/Assistance  ?Patient can return home with the following ? A little help with walking and/or transfers;A lot of help with bathing/dressing/bathroom;Assistance with cooking/housework;Direct supervision/assist for medications management;Direct supervision/assist for financial management;Assist for transportation;Help with stairs or ramp for entrance ?  ?Equipment Recommendations ? Other (comment)  ?  ?   ?Precautions / Restrictions Precautions ?Precautions: Fall ?Restrictions ?Weight Bearing Restrictions: No  ? ? ?  ? ?Mobility Bed  Mobility ?Overal bed mobility: Needs Assistance ?Bed Mobility: Supine to Sit, Sit to Supine ?  ?  ?Supine to sit: Supervision, HOB elevated ?Sit to supine: Min guard, HOB elevated ?  ?General bed mobility comments: Cues for RUE management to protect limb ?  ? ?Transfers ?Overall transfer level: Needs assistance ?Equipment used: None ?Transfers: Sit to/from Stand ?Sit to Stand: Min guard ?  ?  ?  ?  ?  ?  ?  ?  ?Balance Overall balance assessment: Needs assistance ?Sitting-balance support: Feet supported, No upper extremity supported ?Sitting balance-Leahy Scale: Good ?  ?  ?Standing balance support: No upper extremity supported ?Standing balance-Leahy Scale: Fair ?Standing balance comment: able to static stand without UE support; some dynamic activities require single UE support ?  ?  ?  ?  ?  ?  ?  ?  ?  ?  ?  ?   ? ?ADL either performed or assessed with clinical judgement  ? ?ADL Overall ADL's : Needs assistance/impaired ?  ?  ?Grooming: Wash/dry hands;Moderate assistance;Standing ?Grooming Details (indicate cue type and reason): mod A for management of LUE ?  ?  ?  ?  ?  ?  ?  ?  ?Toilet Transfer: Min guard;Ambulation ?Toilet Transfer Details (indicate cue type and reason): no AD, min guard for safety of RUE ?  ?Toileting - Clothing Manipulation Details (indicate cue type and reason): stood at toilet to urinate ?  ?  ?Functional mobility during ADLs: Min guard ?General ADL Comments: limited by flaccid RUE and impaired insight into deficits and safety. poor proprioception of RUE ?  ? ?Extremity/Trunk Assessment Upper Extremity Assessment ?RUE Deficits / Details: Able to perform scapular retraction, shoulder extension, and elbow flexion (with gravity eleminated). Unable to shrugg shoudler. No sensation at hand and decreased sensation at  vetral forearm. Sensation returning at midforearm - sublix noted 3/6 ?RUE: Subluxation noted ?RUE Sensation: decreased light touch ?RUE Coordination: decreased fine motor;decreased  gross motor ?  ?Lower Extremity Assessment ?Lower Extremity Assessment: Defer to PT evaluation ?  ?  ?  ? ?Vision   ?Vision Assessment?: No apparent visual deficits ?  ?Perception Perception ?Perception: Not tested ?  ?Praxis Praxis ?Praxis: Not tested ?  ? ?Cognition Arousal/Alertness: Awake/alert ?Behavior During Therapy: [facility] for tasks assessed/performed ?Overall Cognitive Status: Within Functional Limits for tasks assessed ?  ?  ?  ?  ?  ?  ?General Comments: good problem-soliving with lines/cords, how to set up his room to be able to access everything he wants/needs - mildly impulsive, lmited insight to safety and deficits ?  ?  ?   ?Exercises Other Exercises ?Other Exercises: AAROM squeeze ball activities ?Other Exercises: elbow, wrist and hand AAROM ? ?  ?   ?General Comments R shoulder sublux noted. Contacted Scott with [employer] to obtain a GivMohr sling, Dr. Erlinda Hong contacted for order.  ? ? ?Pertinent Vitals/ Pain       Pain Assessment ?Pain Assessment: No/denies pain ? ? ?Frequency ? Min 2X/week  ? ? ? ? ?  ?Progress Toward Goals ? ?OT Goals(current goals can now be found in the care plan section) ? Progress towards OT goals: Progressing toward goals ? ?Acute Rehab OT Goals ?Patient Stated Goal: get better ?OT Goal Formulation: With patient ?Time For Goal Achievement: 09/23/21 ?Potential to Achieve Goals: Good ?ADL Goals ?Pt Will Perform Grooming: with set-up;with supervision;sitting ?Pt Will Perform Upper Body Bathing: with min assist;sitting ?Pt Will Perform Lower Body Bathing: with min assist;sit to/from stand ?Pt Will Transfer to Toilet: with min assist;with transfer board;bedside commode ?Pt/caregiver will Perform Home Exercise Program: Increased ROM;Right Upper extremity;With written HEP provided ?Additional ADL Goal #1: Pt will be S in and OOB for basic ADLs ?Additional ADL Goal #2: Continue to assess vision  ?Plan Discharge plan remains appropriate   ? ?   ?AM-PAC OT "6 Clicks" Daily Activity     ?Outcome  Measure ? ? Help from another person eating meals?: A Little ?Help from another person taking care of personal grooming?: A Little ?Help from another person toileting, which includes using toliet, bedpan, or urinal?: A Lot ?Help from another person bathing (including washing, rinsing, drying)?: A Lot ?Help from another person to put on and taking off regular upper body clothing?: A Lot ?Help from another person to put on and taking off regular lower body clothing?: A Lot ?6 Click Score: 14 ? ?  ?End of Session Equipment Utilized During Treatment: Gait belt;Rolling walker (2 wheels) ? ?OT Visit Diagnosis: Other abnormalities of gait and mobility (R26.89);Muscle weakness (generalized) (M62.81);Other symptoms and signs involving cognitive function;Hemiplegia and hemiparesis;Pain ?Hemiplegia - Right/Left: Right ?Hemiplegia - dominant/non-dominant: Dominant ?Pain - Right/Left: Right ?Pain - part of body: Hand;Arm ?  ?Activity Tolerance Patient tolerated treatment well ?  ?Patient Left in chair;with call bell/phone within reach ?  ?Nurse Communication Mobility status ?  ? ?   ? ?Time: 4098-1191 ?OT Time Calculation (min): 25 min ? ?Charges: OT General Charges ?$OT Visit: 1 Visit ?OT Treatments ?$Self Care/Home Management : 8-22 mins ?$Therapeutic Exercise: 8-22 mins ? ? ?Ardis Fullwood A Ivannia Willhelm ?09/25/2021, 4:58 PM ?

## 2021-09-25 NOTE — Plan of Care (Signed)
?  Problem: Education: ?Goal: Knowledge of disease or condition will improve ?Outcome: Progressing ?Goal: Knowledge of secondary prevention will improve (SELECT ALL) ?Outcome: Progressing ?Goal: Knowledge of patient specific risk factors will improve (INDIVIDUALIZE FOR PATIENT) ?Outcome: Progressing ?Goal: Individualized Educational Video(s) ?Outcome: Progressing ?  ?Problem: Coping: ?Goal: Will verbalize positive feelings about self ?Outcome: Progressing ?  ?Problem: Health Behavior/Discharge Planning: ?Goal: Ability to manage health-related needs will improve ?Outcome: Progressing ?  ?Problem: Self-Care: ?Goal: Ability to participate in self-care as condition permits will improve ?Outcome: Progressing ?  ?Problem: Ischemic Stroke/TIA Tissue Perfusion: ?Goal: Complications of ischemic stroke/TIA will be minimized ?Outcome: Progressing ?  ?

## 2021-09-25 NOTE — Progress Notes (Signed)
Heart Failure Stewardship Pharmacist Progress Note ? ? ?PCP: Placey, Audrea Muscat, NP ?PCP-Cardiologist: None  ? ? ?HPI:  ?51 yo M with PMH of vestibular schwannoma, asthma, alcohol and cocaine use. He presented to the ED on 2/17 as a code stroke. Received TNK in ED. CXR with pulmonary vascular congestion and mild pulmonary edema. ECHO done on 2/17 with LVEF 25-30%, G2DD, and mildly reduced RV as well as severe mitral stenosis, mild-moderate mitral regurgitation, and moderate-severe tricuspid regurgitation. Suspected rheumatic valve disease. TEE on 2/20 with mild tricuspid regurgitation and findings concerning for rheumatic heart disease but no extensive chordal thickening.  ? ?Current HF Medications: ?Beta Blocker: carvedilol 3.125 mg BID ?ACE/ARB/ARNI: Delene Loll 24/26 mg BID ?SGLT2i: Jardiance 10 mg daily ?Other: BiDil 1 tab TID ? ?Prior to admission HF Medications: ?None ? ?Pertinent Lab Values: ?As of 3/3: Serum creatinine 1.30, BUN 30, Potassium 3.9, Sodium 137, BNP 1570, A1c 5.6  ? ?Vital Signs: ?Weight: 176 lbs (admission weight: 176 lbs) ?Blood pressure: 110/70s  ?Heart rate: 100s  ?I/O not documented ? ?Medication Assistance / Insurance Benefits Check: ?Does the patient have prescription insurance?  No ? ?Does the patient qualify for medication assistance through manufacturers or grants?   Pending ?Eligible grants and/or patient assistance programs: Entresto/Jardiance if meets income requirements ?Medication assistance applications in progress: none  ?Medication assistance applications approved: none ?Approved medication assistance renewals will be completed by: TBD ? ?Outpatient Pharmacy:  ?Prior to admission outpatient pharmacy: [facility] ?Is the patient willing to use [facility] pharmacy at discharge? Yes ?Is the patient willing to transition their outpatient pharmacy to utilize a [facility] outpatient pharmacy?   Pending ?  ? ?Assessment: ?1. Acute systolic CHF (EF 69-48%), due to presumed NICM in the setting  of uncontrolled HTN and cocaine/alcohol use. NYHA class II symptoms. ?- Continue carvedilol 3.125 mg BID ?- Continue Entresto 24/26 mg BID ?- Consider adding spironolactone 12.5 mg daily ?- Continue Jardiance 10 mg daily ?- Continue BiDil 1 tab TID ?- LHC planned as outpatient - discussed with cardiology team and there are no financial concerns with insurance status  ?  ?Plan: ?1) Medication changes recommended at this time: ?- Add spironolactone 12.5 mg daily ? ?2) Patient assistance: ?- Uninsured - HF TOC appt scheduled and will help with pt assistance at that time. Medicaid pending ? ?3)  Education  ?- To be completed prior to discharge ? ?Kerby Nora, PharmD, BCPS ?Heart Failure Stewardship Pharmacist ?Phone [phone] ? ? ?

## 2021-09-25 NOTE — Progress Notes (Signed)
Physical Therapy Treatment ?Patient Details ?Name: Craig Schmidt ?MRN: [id number] ?DOB: [birth year] ?Today's Date: 09/25/2021 ? ? ?History of Present Illness 51 y.o. male presented to the emergency room for sudden onset of right-sided weakness. TNKase given. 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 PHMx:schwannoma, cocaine abuse which he says he has not used over the past few weeks. ? ?  ?PT Comments  ? ? Continue to work on balance, strengthening, and improved gait. Goals updated due to timeframe. Making good progress.    ?Recommendations for follow up therapy are one component of a multi-disciplinary discharge planning process, led by the attending physician.  Recommendations may be updated based on patient status, additional functional criteria and insurance authorization. ? ?Follow Up Recommendations ? Acute inpatient rehab (3hours/day) ?  ?  ?Assistance Recommended at Discharge Intermittent Supervision/Assistance  ?Patient can return home with the following Assistance with cooking/housework;Assist for transportation;Help with stairs or ramp for entrance;A little help with walking and/or transfers ?  ?Equipment Recommendations ? Other (comment) (TBD)  ?  ?Recommendations for Other Services   ? ? ?  ?Precautions / Restrictions Precautions ?Precautions: Fall ?Restrictions ?Weight Bearing Restrictions: No  ?  ? ?Mobility ? Bed Mobility ?Overal bed mobility: Needs Assistance ?Bed Mobility: Supine to Sit, Sit to Supine ?  ?  ?Supine to sit: Supervision, HOB elevated ?Sit to supine: Min guard, HOB elevated ?  ?General bed mobility comments: Cues for RUE management to protect limb ?  ? ?Transfers ?Overall transfer level: Needs assistance ?Equipment used: None ?Transfers: Sit to/from Stand ?Sit to Stand: Min guard ?  ?  ?  ?  ?  ?General transfer comment: especially  during sit to stand exercise with left foot forward of right foot (only able to do x3 with near need of assist--which he doesn't like to allow) ?  ? ?Ambulation/Gait ?Ambulation/Gait assistance: Min guard ?Gait Distance (Feet): 250 Feet ?Assistive device: None ?Gait Pattern/deviations: Step-through pattern, Decreased stride length, Decreased step length - right, Decreased dorsiflexion - right, Wide base of support ?Gait velocity: decr ?Gait velocity interpretation: 1.31 - 2.62 ft/sec, indicative of limited community ambulator ?  ?General Gait Details: cues for upright posture, increasing swing through on RLE, and increasing R foot clearance. ? ? ?Stairs ?  ?  ?  ?  ?  ? ? ?Wheelchair Mobility ?  ? ?Modified Rankin (Stroke Patients Only) ?Modified Rankin (Stroke Patients Only) ?Pre-Morbid Rankin Score: No symptoms ?Modified Rankin: Moderately severe disability ? ? ?  ?Balance Overall balance assessment: Needs assistance ?Sitting-balance support: Feet supported, No upper extremity supported ?Sitting balance-Leahy Scale: Good ?  ?  ?Standing balance support: No upper extremity supported ?Standing balance-Leahy Scale: Fair ?Standing balance comment: able to static stand without UE support; some dynamic activities require single UE support ?  ?  ?  ?  ?  ?  ?  ?  ?  ?  ?  ?  ? ?  ?Cognition Arousal/Alertness: Awake/alert ?Behavior During Therapy: [facility] for tasks assessed/performed ?Overall Cognitive Status: Within Functional Limits for tasks assessed ?  ?  ?  ?  ?  ?  ?  ?  ?  ?  ?  ?  ?  ?  ?  ?  ?General Comments: good problem-soliving with lines/cords, how to set up his room to be able to access everything he wants/needs ?  ?  ? ?  ?Exercises  General Exercises - Lower Extremity ?Hip ABduction/ADduction: AROM, Strengthening, Standing, 20 reps (alternating sides for incr challenge; single UE support) ?Other Exercises ?Other Exercises: forward step up x20 RLE without UE support ?Other Exercises: sit to stand with left foot  forward of right foot x 3 reps and pt could not do anymore because of back pain (and did not want to allow assist) ? ?  ?General Comments General comments (skin integrity, edema, etc.): utilized gait belt as a sling ?  ?  ? ?Pertinent Vitals/Pain Pain Assessment ?Pain Assessment: No/denies pain  ? ? ?Home Living   ?  ?  ?  ?  ?  ?  ?  ?  ?  ?   ?  ?Prior Function    ?  ?  ?   ? ?PT Goals (current goals can now be found in the care plan section) Acute Rehab PT Goals ?Patient Stated Goal: get the R arm working, but if not a good sling. ?PT Goal Formulation: With patient ?Time For Goal Achievement: 10/07/21 ?Potential to Achieve Goals: Fair ?Progress towards PT goals: Progressing toward goals (goals updated based on timeframe) ? ?  ?Frequency ? ? ? Min 4X/week ? ? ? ?  ?PT Plan Current plan remains appropriate  ? ? ?Co-evaluation   ?  ?  ?  ?  ? ?  ?AM-PAC PT "6 Clicks" Mobility   ?Outcome Measure ? Help needed turning from your back to your side while in a flat bed without using bedrails?: A Little ?Help needed moving from lying on your back to sitting on the side of a flat bed without using bedrails?: A Little ?Help needed moving to and from a bed to a chair (including a wheelchair)?: A Little ?Help needed standing up from a chair using your arms (e.g., wheelchair or bedside chair)?: A Little ?Help needed to walk in hospital room?: A Little ?Help needed climbing 3-5 steps with a railing? : A Little ?6 Click Score: 18 ? ?  ?End of Session Equipment Utilized During Treatment:  (pt refused gait belt) ?Activity Tolerance: Patient tolerated treatment well ?Patient left: in bed;with call bell/phone within reach ?  ?PT Visit Diagnosis: Other abnormalities of gait and mobility (R26.89);Muscle weakness (generalized) (M62.81);Hemiplegia and hemiparesis ?Hemiplegia - Right/Left: Right ?Hemiplegia - dominant/non-dominant: Dominant ?Hemiplegia - caused by: Cerebral infarction ?  ? ? ?Time: 6270-3500 ?PT Time Calculation (min)  (ACUTE ONLY): 37 min ? ?Charges:  $Gait Training: 8-22 mins ?$Neuromuscular Re-education: 8-22 mins          ?          ? ? ?Arby Barrette, PT ?Acute Rehabilitation Services  ?Pager [phone] ?Office [phone] ? ? ? ?Jeanie Cooks Chynna Buerkle ?09/25/2021, 10:07 AM ? ?

## 2021-09-25 NOTE — Progress Notes (Addendum)
STROKE TEAM PROGRESS NOTE  ? ?INTERVAL HISTORY ?Patient has been hemodynamically stable and has a stable neurological exam.  He has had no acute events overnight.  He is complains of pain in his right arm and leg. ? ? ?Vitals:  ? 09/24/21 2055 09/24/21 2310 09/25/21 0405 09/25/21 0834  ?BP: 126/88 119/73 112/72 109/81  ?Pulse: 99 (!) 110 (!) 102 99  ?Resp: '20 19 18 18  '$ ?Temp: 98 ?F (36.7 ?C) 97.9 ?F (36.6 ?C) 98.2 ?F (36.8 ?C) 98.1 ?F (36.7 ?C)  ?TempSrc: Oral Axillary Oral   ?SpO2: 99% 100% 99% 100%  ?Weight:      ?Height:      ? ?CBC:  ?Recent Labs  ?Lab 09/22/21 ?0518 09/25/21 ?0300  ?WBC 8.1 7.9  ?HGB 13.1 12.9*  ?HCT 40.9 39.7  ?MCV 88.7 87.4  ?PLT 440* 497*  ? ? ?Basic Metabolic Panel:  ?Recent Labs  ?Lab 09/22/21 ?0518  ?NA 137  ?K 3.9  ?CL 106  ?CO2 23  ?GLUCOSE 122*  ?BUN 30*  ?CREATININE 1.30*  ?CALCIUM 9.3  ? ? ?Lipid Panel:  ?No results for input(s): CHOL, TRIG, HDL, CHOLHDL, VLDL, LDLCALC in the last 168 hours. ? ?HgbA1c:  ?No results for input(s): HGBA1C in the last 168 hours. ? ?Urine Drug Screen:  ?No results for input(s): LABOPIA, COCAINSCRNUR, LABBENZ, AMPHETMU, THCU, LABBARB in the last 168 hours.  ?Alcohol Level  ?No results for input(s): ETH in the last 168 hours. ? ?IMAGING past 24 hours ?No results found. ? ?PHYSICAL EXAM ?General:  Alert, awake.  ? ? ?NEURO:  ?Mental Status: AA&Ox3  ?Speech/Language: speech is without dysarthria or aphasia.   ? ?Cranial Nerves:  ?II: PERRL. Visual fields full.  ?III, IV, VI: EOMI. Eyelids elevate symmetrically.  ?V: normal sensation on left, mildly decreased on right. ?VII: mild right facial droop on activation. ?VIII: hearing intact to voice. ?IX, X:  Phonation is normal.  ?XII: tongue is midline without fasciculations. ?Motor: 5/5 strength to LUE and LLE, 3/5 strength to RUE and 4/5 strength to RLE ?Tone: is normal and bulk is normal ?Sensation- right sided decreased sensation present ?Coordination: FTN intact on left ?Gait- deferred ? ?Right foot swelling  noted but mild. No evidence of infection. ? ?ASSESSMENT/PLAN ?Craig Schmidt is a 51 y.o. male with history of schwannoma, cocaine use and smoking presenting with sudden onset right sided weakness and slurred speech.  He was given TNK in the ED.  No LVO was seen on CTA head, so thrombectomy could not be performed.  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  Coumadin was started, and TEE completed.  ? ?Working with PT today. Labs today stable. Awaiting placement. ? 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 Generalized volume loss and left cerebellopontine angle vestibular schwannoma ASPECTS 10.    ?CTA head & neck negative for LVO ?MRI  patchy multifocal acute ischemic infarcts in left greater than right cerebral hemispheres and cerebellum, chronic microvascular ischemic disease, 1.4 cm left vestibular schwannoma ?2D Echo EF 25-30%, global left ventricular hypokinesis, grade 2 diastolic dysfunction, rheumatic mitral valve with moderate mitral stenosis, moderate to severe tricuspid regurgitation, dilated right and left atria, no atrial level shunt ?TEE showed no LV thrombus, LAA spontaneous contrast, mild MR and severe MS, questionable rheumatic etiology ?Cardiology recommend 30 day monitoring to rule out afib as outpt ?LDL 77 ?  HgbA1c 5.6 ?VTE prophylaxis - lovenox ?No antithrombotic prior to admission, now on warfarin daily and INR 2.2. ?Therapy recommendations:   CIR ?Disposition:  pending ? ?Hypertension ?Home meds:  none ?Stable ?Keep BP <180/105 ?Off cleviprex ?BiDil and Coreg started by cardiology ?Coreg reduced to 3.25 mg BID due to hypotension ?Long-term BP goal normotensive ? ?Hyperlipidemia ?Home meds:  none ?LDL 77, goal < 70 ?Continue atorvastatin 40 mg daily  ?Continue statin at discharge ? 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  ? 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 ? ?Tobacco abuse ?Current smoker ?Smoking cessation counseling provided ?Nicotine patch provided, dose decreased to 14 mg 3/6, decrease again on 3/20 and discontinue on 4/3 ?Pt is willing to quit ? ?Cocaine abuse ?Cocaine cessation education provided ?Patient is willing to quit ?Okay with Coreg per cardiology ? ?Alcohol abuse ?On FA/B1/MVI ?Watch for alcohol withdraw symptoms ?HD #17 ? ?Other Stroke Risk Factors ?Drug abuse ? ?Other Active Problems ? b/l feet/leg pain-controlled. ?con't gabapentin 300 BID. ?Melatonin '3mg'$  -> '5mg'$   ?U/S neg for DVT. ? ?Hospital day # 17 ? ?[place] , MSN, AGACNP-BC ?[facility] ?See Amion for schedule and pager information ?09/25/2021 12:25 PM ? ?ATTENDING NOTE: ?I reviewed above note and agree with the assessment and plan.  ? ?No acute event overnight, neuro stable, INR 2.2, continue current management, waiting for placement. ? ?For detailed assessment and plan, please refer to above as I have made changes wherever appropriate.  ? ?[name], MD PhD ?Stroke Neurology ?09/25/2021 ?6:16 PM ? ? ?To contact Stroke Continuity provider, please refer to [email or web address] ?After hours, contact General Neurology  ?

## 2021-09-25 NOTE — Progress Notes (Signed)
ANTICOAGULATION CONSULT NOTE - Follow Up Consult ? ?Pharmacy Consult for Warfarin ?Indication:  rheumatic valve stenosis; CVA ? ?Allergies  ?Allergen Reactions  ? Peanut-Containing Drug Products Itching  ? Shellfish Allergy Swelling  ? ? ?Patient Measurements: ?Height: '5\' 5"'$  (165.1 cm) ?Weight: 80 kg (176 lb 5.9 oz) ?IBW/kg (Calculated) : 61.5 ? ?Vital Signs: ?Temp: 98.1 ?F (36.7 ?C) (03/06 5409) ?Temp Source: Oral (03/06 0405) ?BP: 109/81 (03/06 0834) ?Pulse Rate: 99 (03/06 0834) ? ?Labs: ?Recent Labs  ?  09/23/21 ?8119 09/24/21 ?0409 09/25/21 ?0300  ?HGB  --   --  12.9*  ?HCT  --   --  39.7  ?PLT  --   --  497*  ?LABPROT 26.9* 25.2* 24.1*  ?INR 2.5* 2.3* 2.2*  ? ? ? ?Estimated Creatinine Clearance: 67 mL/min (A) (by C-G formula based on SCr of 1.3 mg/dL (H)). ? 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 Pharmacy consulted to dose warfarin on 2/18. S/p TNK on 2/17 at ~9:30am. TEE negative for thrombus and Cardiology recommends 30-day cardiac even monitor as outpatient to rule out atrial fibrillation.  ? ?  INR 2.2  Remains therapeutic but trended down.   ?  Warfarin 5 mg MWF and 7.5 mg TTSS begun 3/3, so 5 mg given on 3/3 (Friday) and 7.5 mg doses given over the weekend (3/4 and 3/5). ? ?Goal of Therapy:  ?INR 2-3 ?Monitor platelets by anticoagulation protocol: Yes ?  ?Plan:  ?Warfarin 7.5 mg x 1 today rather than 5 mg. ?Daily PT/INR ? ?Arty Baumgartner, [place] ?09/25/2021,11:43 AM ? ? ?

## 2021-09-25 NOTE — Progress Notes (Signed)
Orthopedic Tech Progress Note ?Patient Details:  ?Craig Schmidt ?21-Jan-1971 ?[id number] ? ?Called in order to [facility] for a [place]  ? ?Patient ID: Craig Schmidt, male   DOB: 1971-01-26, 51 y.o.   MRN: [id number] ? ?Craig Schmidt ?09/25/2021, 3:50 PM ? ?

## 2021-09-25 NOTE — Progress Notes (Addendum)
Heart Failure Nurse Navigator Progress Note ? ?Pt continues with hospitalization. Currently appears to be awaiting SNF placement. Changed HV TOC clinic appt for 3/17 @ 11AM. Will converse with CSW regarding transportation.  ? ?Updated AVS with new appointment time.  ? ?Earnestine Leys, BSN, RN ?Heart Failure Nurse Navigator ?[phone] ? ?

## 2021-09-25 NOTE — Plan of Care (Signed)
Pt has been doing well and walking around the unit with PT. Urinal used during the day.  ? ?Problem: Education: ?Goal: Knowledge of disease or condition will improve ?Outcome: Progressing ?Goal: Knowledge of secondary prevention will improve (SELECT ALL) ?Outcome: Progressing ?Goal: Knowledge of patient specific risk factors will improve (INDIVIDUALIZE FOR PATIENT) ?Outcome: Progressing ?Goal: Individualized Educational Video(s) ?Outcome: Progressing ?  ?Problem: Coping: ?Goal: Will verbalize positive feelings about self ?Outcome: Progressing ?  ?Problem: Health Behavior/Discharge Planning: ?Goal: Ability to manage health-related needs will improve ?Outcome: Progressing ?  ?Problem: Self-Care: ?Goal: Ability to participate in self-care as condition permits will improve ?Outcome: Progressing ?  ?Problem: Ischemic Stroke/TIA Tissue Perfusion: ?Goal: Complications of ischemic stroke/TIA will be minimized ?Outcome: Progressing ?  ?

## 2021-09-26 ENCOUNTER — Encounter (HOSPITAL_COMMUNITY): Payer: Self-pay

## 2021-09-26 LAB — CBC
HCT: 39.1 % (ref 39.0–52.0)
Hemoglobin: 12.3 g/dL — ABNORMAL LOW (ref 13.0–17.0)
MCH: 28.1 pg (ref 26.0–34.0)
MCHC: 31.5 g/dL (ref 30.0–36.0)
MCV: 89.3 fL (ref 80.0–100.0)
Platelets: 501 10*3/uL — ABNORMAL HIGH (ref 150–400)
RBC: 4.38 MIL/uL (ref 4.22–5.81)
RDW: 14.1 % (ref 11.5–15.5)
WBC: 9.3 10*3/uL (ref 4.0–10.5)
nRBC: 0 % (ref 0.0–0.2)

## 2021-09-26 LAB — BASIC METABOLIC PANEL
Anion gap: 7 (ref 5–15)
BUN: 29 mg/dL — ABNORMAL HIGH (ref 6–20)
CO2: 26 mmol/L (ref 22–32)
Calcium: 9.5 mg/dL (ref 8.9–10.3)
Chloride: 101 mmol/L (ref 98–111)
Creatinine, Ser: 1.42 mg/dL — ABNORMAL HIGH (ref 0.61–1.24)
GFR, Estimated: >60 mL/min (ref >60)
Glucose, Bld: 109 mg/dL — ABNORMAL HIGH (ref 70–99)
Potassium: 4.4 mmol/L (ref 3.5–5.1)
Sodium: 134 mmol/L — ABNORMAL LOW (ref 135–145)

## 2021-09-26 LAB — PROTIME-INR
INR: 2.3 — ABNORMAL HIGH (ref 0.8–1.2)
Prothrombin Time: 25.6 seconds — ABNORMAL HIGH (ref 11.4–15.2)

## 2021-09-26 NOTE — Progress Notes (Signed)
Physical Therapy Treatment ?Patient Details ?Name: Craig Schmidt ?MRN: [id number] ?DOB: 1971-02-05 ?Today's Date: 09/26/2021 ? ? ?History of Present Illness 51 y.o. male presented to the emergency room for sudden onset of right-sided weakness. TNKase given. 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 PHMx:schwannoma, cocaine abuse which he says he has not used over the past few weeks. ? ?  ?PT Comments  ? ? Pt continues to progress towards all goals. Focused on optimal fitting of R UE sling and working on pt using L UE to put R hand in/out of sling in sitting. Pt continues to have impaired balance dynamically and against moderate perturbations but  continues to improve. Acute PT to cont to follow. ?  ?Recommendations for follow up therapy are one component of a multi-disciplinary discharge planning process, led by the attending physician.  Recommendations may be updated based on patient status, additional functional criteria and insurance authorization. ? ?Follow Up Recommendations ? Acute inpatient rehab (3hours/day) ?  ?  ?Assistance Recommended at Discharge Intermittent Supervision/Assistance  ?Patient can return home with the following Assistance with cooking/housework;Assist for transportation;Help with stairs or ramp for entrance;A little help with walking and/or transfers ?  ?Equipment Recommendations ? Other (comment) (TBD)  ?  ?Recommendations for Other Services   ? ? ?  ?Precautions / Restrictions Precautions ?Precautions: Fall ?Precaution Comments: R UE flaccid ?Required Braces or Orthoses: Sling (R UE sling to be worn when OOB) ?Restrictions ?Weight Bearing Restrictions: No ?Other Position/Activity Restrictions: R UE is flaccid  ?  ? ?Mobility ? Bed Mobility ?Overal bed mobility: Needs Assistance ?Bed Mobility: Supine to Sit ?  ?  ?Supine to sit:  Supervision, HOB elevated ?  ?  ?General bed mobility comments: quick to move, HOB elevated all the way up ?  ? ?Transfers ?Overall transfer level: Needs assistance ?Equipment used: None ?Transfers: Sit to/from Stand ?Sit to Stand: Min guard ?  ?Step pivot transfers: Min assist ?  ?  ?  ?General transfer comment: pt with posterior LOB in which he was able to catch self by leaning up against side of bed with minA ?  ? ?Ambulation/Gait ?Ambulation/Gait assistance: Min guard ?Gait Distance (Feet): 200 Feet ?Assistive device: None ?Gait Pattern/deviations: Step-through pattern, Decreased stride length, Decreased step length - right, Decreased dorsiflexion - right, Wide base of support ?Gait velocity: decr ?Gait velocity interpretation: 1.31 - 2.62 ft/sec, indicative of limited community ambulator ?  ?General Gait Details: pt with decreased R LE step height and length, Sling very supportive for R UE during ambulation ? ? ?Stairs ?  ?  ?  ?  ?  ? ? ?Wheelchair Mobility ?  ? ?Modified Rankin (Stroke Patients Only) ?Modified Rankin (Stroke Patients Only) ?Pre-Morbid Rankin Score: No symptoms ?Modified Rankin: Moderately severe disability ? ? ?  ?Balance Overall balance assessment: Needs assistance ?Sitting-balance support: Feet supported, No upper extremity supported ?Sitting balance-Leahy Scale: Good ?Sitting balance - Comments: able to perform self care tasks seated on EOB ?  ?Standing balance support: No upper extremity supported ?Standing balance-Leahy Scale: Fair ?Standing balance comment: able to static stand without UE support; some dynamic activities require single UE support ?  ?  ?  ?  ?  ?  ?  ?High Level Balance Comments: pt unable to withstand moderate pertebations during ambulation without minA to prevent fall ?  ?  ?  ?  ? ?  ?  Cognition Arousal/Alertness: Awake/alert ?Behavior During Therapy: [facility] for tasks assessed/performed ?Overall Cognitive Status: Within Functional Limits for tasks assessed ?  ?  ?  ?  ?   ?  ?  ?  ?  ?  ?  ?  ?  ?  ?  ?  ?General Comments: mildly impulsive but and perseverating on equipment that pt doesnt need ie. hemiwalker, limited understanding on why he shouldn't wear the sling in bed/sleeping at night due to skin break down ?  ?  ? ?  ?Exercises   ? ?  ?General Comments General comments (skin integrity, edema, etc.): worked with pt on donning R UE sling and fitting it for optimal position, educated pt on how take his hand in/out of it while sitting ?  ?  ? ?Pertinent Vitals/Pain Pain Assessment ?Pain Assessment: No/denies pain ?Pain Location: reports back pain at night  ? ? ?Home Living   ?  ?  ?  ?  ?  ?  ?  ?  ?  ?   ?  ?Prior Function    ?  ?  ?   ? ?PT Goals (current goals can now be found in the care plan section) Acute Rehab PT Goals ?PT Goal Formulation: With patient ?Time For Goal Achievement: 10/07/21 ?Potential to Achieve Goals: Fair ?Progress towards PT goals: Progressing toward goals ? ?  ?Frequency ? ? ? Min 4X/week ? ? ? ?  ?PT Plan Current plan remains appropriate  ? ? ?Co-evaluation   ?  ?  ?  ?  ? ?  ?AM-PAC PT "6 Clicks" Mobility   ?Outcome Measure ? Help needed turning from your back to your side while in a flat bed without using bedrails?: A Little ?Help needed moving from lying on your back to sitting on the side of a flat bed without using bedrails?: A Little ?Help needed moving to and from a bed to a chair (including a wheelchair)?: A Little ?Help needed standing up from a chair using your arms (e.g., wheelchair or bedside chair)?: A Little ?Help needed to walk in hospital room?: A Little ?Help needed climbing 3-5 steps with a railing? : A Little ?6 Click Score: 18 ? ?  ?End of Session Equipment Utilized During Treatment: Gait belt ?Activity Tolerance: Patient tolerated treatment well ?Patient left: with call bell/phone within reach;in chair;with chair alarm set ?Nurse Communication: Mobility status ?PT Visit Diagnosis: Other abnormalities of gait and mobility  (R26.89);Muscle weakness (generalized) (M62.81);Hemiplegia and hemiparesis ?Hemiplegia - Right/Left: Right ?Hemiplegia - dominant/non-dominant: Dominant ?Hemiplegia - caused by: Cerebral infarction ?  ? ? ?Time: 4665-9935 ?PT Time Calculation (min) (ACUTE ONLY): 28 min ? ?Charges:  $Gait Training: 8-22 mins ?$Therapeutic Activity: 8-22 mins          ?          ? ?Kittie Plater, PT, DPT ?Acute Rehabilitation Services ?Pager #: [phone] ?Office #: [phone] ? ? ? ?Lorilynn Lehr M Alvilda Mckenna ?09/26/2021, 3:05 PM ? ?

## 2021-09-26 NOTE — Progress Notes (Signed)
Heart Failure Stewardship Pharmacist Progress Note ? ? ?PCP: Placey, Audrea Muscat, NP ?PCP-Cardiologist: None  ? ? ?HPI:  ?51 yo M with PMH of vestibular schwannoma, asthma, alcohol and cocaine use. He presented to the ED on 2/17 as a code stroke. Received TNK in ED. CXR with pulmonary vascular congestion and mild pulmonary edema. ECHO done on 2/17 with LVEF 25-30%, G2DD, and mildly reduced RV as well as severe mitral stenosis, mild-moderate mitral regurgitation, and moderate-severe tricuspid regurgitation. Suspected rheumatic valve disease. TEE on 2/20 with mild tricuspid regurgitation and findings concerning for rheumatic heart disease but no extensive chordal thickening.  ? ?Current HF Medications: ?Beta Blocker: carvedilol 3.125 mg BID ?ACE/ARB/ARNI: Delene Loll 24/26 mg BID ?SGLT2i: Jardiance 10 mg daily ?Other: BiDil 1 tab TID ? ?Prior to admission HF Medications: ?None ? ?Pertinent Lab Values: ?Serum creatinine 1.42, BUN 29, Potassium 4.4, Sodium 134, BNP 1570, A1c 5.6  ? ?Vital Signs: ?Weight: 176 lbs (admission weight: 176 lbs) ?Blood pressure: 120/80s  ?Heart rate: 90-100s  ?I/O not documented ? ?Medication Assistance / Insurance Benefits Check: ?Does the patient have prescription insurance?  No ? ?Does the patient qualify for medication assistance through manufacturers or grants?   Pending ?Eligible grants and/or patient assistance programs: Entresto/Jardiance if meets income requirements ?Medication assistance applications in progress: none  ?Medication assistance applications approved: none ?Approved medication assistance renewals will be completed by: TBD ? ?Outpatient Pharmacy:  ?Prior to admission outpatient pharmacy: [facility] ?Is the patient willing to use [place] pharmacy at discharge? Yes ?Is the patient willing to transition their outpatient pharmacy to utilize a [facility] outpatient pharmacy?   Pending ?  ? ?Assessment: ?1. Acute systolic CHF (EF 41-74%), due to presumed NICM in the setting of  uncontrolled HTN and cocaine/alcohol use. NYHA class II symptoms. ?- Continue carvedilol 3.125 mg BID ?- Continue Entresto 24/26 mg BID ?- Consider adding spironolactone 12.5 mg daily ?- Continue Jardiance 10 mg daily ?- Continue BiDil 1 tab TID ?- LHC planned as outpatient - discussed with cardiology team and there are no financial concerns with insurance status  ?  ?Plan: ?1) Medication changes recommended at this time: ?- Add spironolactone 12.5 mg daily ? ?2) Patient assistance: ?- Uninsured - HF TOC appt scheduled and will help with pt assistance at that time. Medicaid pending ? ?3)  Education  ?- To be completed prior to discharge ? ?Kerby Nora, PharmD, BCPS ?Heart Failure Stewardship Pharmacist ?Phone [phone] ? ? ?

## 2021-09-26 NOTE — Progress Notes (Signed)
ANTICOAGULATION CONSULT NOTE - Follow Up Consult ? ?Pharmacy Consult for Warfarin ?Indication:  rheumatic valve stenosis; CVA ? ?Allergies  ?Allergen Reactions  ? Peanut-Containing Drug Products Itching  ? Shellfish Allergy Swelling  ? ? ?Patient Measurements: ?Height: '5\' 5"'$  (165.1 cm) ?Weight: 80 kg (176 lb 5.9 oz) ?IBW/kg (Calculated) : 61.5 ? ?Vital Signs: ?Temp: 98 ?F (36.7 ?C) (03/07 0830) ?Temp Source: Oral (03/07 0830) ?BP: 116/86 (03/07 0830) ?Pulse Rate: 97 (03/07 0830) ? ?Labs: ?Recent Labs  ?  09/24/21 ?0409 09/25/21 ?0300 09/26/21 ?0121  ?HGB  --  12.9* 12.3*  ?HCT  --  39.7 39.1  ?PLT  --  497* 501*  ?LABPROT 25.2* 24.1* 25.6*  ?INR 2.3* 2.2* 2.3*  ?CREATININE  --   --  1.42*  ? ? ? ?Estimated Creatinine Clearance: 61.3 mL/min (A) (by C-G formula based on SCr of 1.42 mg/dL (H)). ? 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 Pharmacy consulted to dose warfarin on 2/18. S/p TNK on 2/17 at ~9:30am. TEE negative for thrombus and Cardiology recommends 30-day cardiac even monitor as outpatient to rule out atrial fibrillation.  ? ?  INR 2.3  Remains therapeutic. 7.5 mg given rather than 5 mg on 3/6. ?  Warfarin 5 mg MWF and 7.5 mg TTSS begun 3/3, so 5 mg given on 3/3 (Friday) and 7.5 mg doses given over the weekend (3/4 and 3/5). ? ?Goal of Therapy:  ?INR 2-3 ?Monitor platelets by anticoagulation protocol: Yes ?  ?Plan:  ?Continue Warfarin 5 mg MWF and 7.5 mg TTSS. 7.5 mg due today. ?May need to adjust regimen.  ?Daily PT/INR ?Change to less frequent monitoring when INR stable. ? ?Arty Baumgartner, RPh ?09/26/2021,12:04 PM ? ? ?

## 2021-09-26 NOTE — Progress Notes (Signed)
Heart Failure Nurse Navigator Progress Note ? ?Gave patient bus passes x 2 to assist with transportation to/from HV TOC appt scheduled 3/17 @ 11AM. ? ?Pricilla Holm, MSN, RN ?Heart Failure Nurse Navigator ?[phone] ? ?

## 2021-09-26 NOTE — Progress Notes (Signed)
Bus passes in chart. ?

## 2021-09-26 NOTE — Progress Notes (Signed)
STROKE TEAM PROGRESS NOTE  ? ?INTERVAL HISTORY ?No family at the bedside. Pt neuro stable, much improved over time, now able to walk in room and hallway without assistance or walker. Still has RUE weakness but proximal 3/5. He is hard placement, CW is working on placement.  ? ? ?Vitals:  ? 09/26/21 0341 09/26/21 0830 09/26/21 1248 09/26/21 1400  ?BP: 127/75 116/86 (!) 87/49 92/64  ?Pulse: 93 97 86 85  ?Resp: '17 16 14   '$ ?Temp: 97.7 ?F (36.5 ?C) 98 ?F (36.7 ?C) 98.3 ?F (36.8 ?C)   ?TempSrc: Oral Oral Oral   ?SpO2: 99% 100% 100%   ?Weight:      ?Height:      ? ?CBC:  ?Recent Labs  ?Lab 09/25/21 ?0300 09/26/21 ?0121  ?WBC 7.9 9.3  ?HGB 12.9* 12.3*  ?HCT 39.7 39.1  ?MCV 87.4 89.3  ?PLT 497* 501*  ? ?Basic Metabolic Panel:  ?Recent Labs  ?Lab 09/22/21 ?0518 09/26/21 ?0121  ?NA 137 134*  ?K 3.9 4.4  ?CL 106 101  ?CO2 23 26  ?GLUCOSE 122* 109*  ?BUN 30* 29*  ?CREATININE 1.30* 1.42*  ?CALCIUM 9.3 9.5  ? ?Lipid Panel:  ?No results for input(s): CHOL, TRIG, HDL, CHOLHDL, VLDL, LDLCALC in the last 168 hours. ? ?HgbA1c:  ?No results for input(s): HGBA1C in the last 168 hours. ? ?Urine Drug Screen:  ?No results for input(s): LABOPIA, COCAINSCRNUR, LABBENZ, AMPHETMU, THCU, LABBARB in the last 168 hours.  ?Alcohol Level  ?No results for input(s): ETH in the last 168 hours. ? ?IMAGING past 24 hours ?No results found. ? ?PHYSICAL EXAM ?General:  Alert, awake.  ? ? ?NEURO:  ?Mental Status: AA&Ox3  ?Speech/Language: speech is without dysarthria or aphasia.   ? ?Cranial Nerves:  ?II: PERRL. Visual fields full.  ?III, IV, VI: EOMI. Eyelids elevate symmetrically.  ?V: normal sensation on left, mildly decreased on right. ?VII: mild right facial droop  ?VIII: hearing intact to voice. ?IX, X:  Phonation is normal.  ?XII: tongue is midline without fasciculations. ?Motor: 5/5 strength to LUE and LLE, 3/5 strength to RUE and 4+/5 strength to RLE ?Tone: is normal and bulk is normal ?Sensation- right sided decreased sensation  present ?Coordination: FTN intact on left ?Gait- deferred ? ?Right foot swelling noted but mild. No evidence of infection. ? ?ASSESSMENT/PLAN ?[name] is a 51 y.o. male with history of schwannoma, cocaine use and smoking presenting with sudden onset right sided weakness and slurred speech.  He was given TNK in the ED.  No LVO was seen on CTA head, so thrombectomy could not be performed.  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  Coumadin was started, and TEE completed.  ? ?Working with PT today. Labs today stable. Awaiting placement. ? 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 Generalized volume loss and left cerebellopontine angle vestibular schwannoma ASPECTS 10.    ?CTA head & neck negative for LVO ?MRI  patchy multifocal acute ischemic infarcts in left greater than right cerebral hemispheres and cerebellum, chronic microvascular ischemic disease, 1.4 cm left vestibular schwannoma ?2D Echo EF 25-30%, global left ventricular hypokinesis, grade 2 diastolic dysfunction, rheumatic mitral valve with moderate mitral stenosis, moderate to severe tricuspid regurgitation, dilated right and left atria, no atrial level shunt ?TEE showed no LV thrombus, LAA spontaneous contrast, mild MR and severe MS, questionable rheumatic etiology ?  Cardiology recommend 30 day monitoring to rule out afib as outpt ?LDL 77 ?HgbA1c 5.6 ?VTE prophylaxis - lovenox ?No antithrombotic prior to admission, now on warfarin daily and INR therapeutic. ?Therapy recommendations:   pending ?Disposition:  pending ? ?Hypertension ?Home meds:  none ?Stable ?BiDil and Coreg started by cardiology ?Coreg reduced to 3.25 mg BID due to hypotension ?Also on entrestro ?Long-term BP goal normotensive ? ?Hyperlipidemia ?Home meds:  none ?LDL 77, goal < 70 ?Continue atorvastatin 40 mg daily   ?Continue statin at discharge ? 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  ? 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 ? ?Tobacco abuse ?Current smoker ?Smoking cessation counseling provided ?Nicotine patch provided, dose decreased to 14 mg 3/6, decrease again on 3/20 and discontinue on 4/3 ?Pt is willing to quit ? ?Cocaine abuse ?Cocaine cessation education provided ?Patient is willing to quit ?Okay with Coreg per cardiology ? ?Alcohol abuse ?On FA/B1/MVI ?Watch for alcohol withdraw symptoms ? ?Other Stroke Risk Factors ?Drug abuse ? ?Other Active Problems ? b/l feet/leg pain-controlled. ?con't gabapentin 300 BID. ?Melatonin '3mg'$  -> '5mg'$   ?U/S neg for DVT. ? ?Hospital day # 18 ? ? ?[name], MD PhD ?Stroke Neurology ?09/26/2021 ?3:36 PM ? ? ?To contact Stroke Continuity provider, please refer to [email or web address] ?After hours, contact General Neurology  ?

## 2021-09-27 DIAGNOSIS — I63412 Cerebral infarction due to embolism of left middle cerebral artery: Secondary | ICD-10-CM

## 2021-09-27 DIAGNOSIS — F1410 Cocaine abuse, uncomplicated: Secondary | ICD-10-CM

## 2021-09-27 DIAGNOSIS — F1010 Alcohol abuse, uncomplicated: Secondary | ICD-10-CM

## 2021-09-27 DIAGNOSIS — M26609 Unspecified temporomandibular joint disorder, unspecified side: Secondary | ICD-10-CM

## 2021-09-27 DIAGNOSIS — F17200 Nicotine dependence, unspecified, uncomplicated: Secondary | ICD-10-CM

## 2021-09-27 DIAGNOSIS — I255 Ischemic cardiomyopathy: Secondary | ICD-10-CM

## 2021-09-27 DIAGNOSIS — R0789 Other chest pain: Secondary | ICD-10-CM

## 2021-09-27 LAB — BASIC METABOLIC PANEL
Anion gap: 8 (ref 5–15)
BUN: 37 mg/dL — ABNORMAL HIGH (ref 6–20)
CO2: 24 mmol/L (ref 22–32)
Calcium: 9.1 mg/dL (ref 8.9–10.3)
Chloride: 103 mmol/L (ref 98–111)
Creatinine, Ser: 1.46 mg/dL — ABNORMAL HIGH (ref 0.61–1.24)
GFR, Estimated: 59 mL/min — ABNORMAL LOW (ref >60)
Glucose, Bld: 105 mg/dL — ABNORMAL HIGH (ref 70–99)
Potassium: 4.6 mmol/L (ref 3.5–5.1)
Sodium: 135 mmol/L (ref 135–145)

## 2021-09-27 LAB — CBC
HCT: 36.1 % — ABNORMAL LOW (ref 39.0–52.0)
Hemoglobin: 11.4 g/dL — ABNORMAL LOW (ref 13.0–17.0)
MCH: 27.8 pg (ref 26.0–34.0)
MCHC: 31.6 g/dL (ref 30.0–36.0)
MCV: 88 fL (ref 80.0–100.0)
Platelets: 470 10*3/uL — ABNORMAL HIGH (ref 150–400)
RBC: 4.1 MIL/uL — ABNORMAL LOW (ref 4.22–5.81)
RDW: 14.3 % (ref 11.5–15.5)
WBC: 8 10*3/uL (ref 4.0–10.5)
nRBC: 0 % (ref 0.0–0.2)

## 2021-09-27 LAB — PROTIME-INR
INR: 2.6 — ABNORMAL HIGH (ref 0.8–1.2)
Prothrombin Time: 27.9 seconds — ABNORMAL HIGH (ref 11.4–15.2)

## 2021-09-27 LAB — TROPONIN I (HIGH SENSITIVITY)
Troponin I (High Sensitivity): 21 ng/L — ABNORMAL HIGH (ref <18)
Troponin I (High Sensitivity): 19 ng/L — ABNORMAL HIGH (ref <18)

## 2021-09-27 MED ORDER — SODIUM CHLORIDE 0.9 % IV BOLUS
500.0000 mL | Freq: Once | INTRAVENOUS | Status: AC
  Administered 2021-09-27: 500 mL via INTRAVENOUS

## 2021-09-27 NOTE — Consult Note (Signed)
Cardiology Consultation:   Patient ID: Craig Schmidt MRN: [id number]; DOB: [birth year]  Admit date: 09/08/2021 Date of Consult: 09/27/2021  PCP:  Marliss Coots, NP   [facility] Providers Cardiologist:  None        Patient Profile:   Craig Schmidt is a 51 y.o. 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 Left vestibular shwannoma. He is status post IV TNKase.  Cardiology consulted originally 2/17 for new onset systolic heart failure as well as mitral valve stenosis  History of Present Illness:    Craig Schmidt is a 51 y.o. 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 He is status post IV TNKase. Cardiology consulted originally 2/17 for new onset systolic heart failure as well as mitral valve stenosis. 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 MVA 1.45 cm2 by planimetery. Also had moderate mitral regurgitation.  EF 25-30% with globally hypokinesis. Plan was to conduct LHC/RHC as an outpatient and consideration for MVR at [facility]; wilkins ~ 9.  He has been awaiting placement for a few weeks post stroke 2/2 home insecurity. His not eligible for SNF 2/2 cocaine hx. He's been stable. Cardiology was re-engaged today 2/2 episode of hypotension and dizziness this AM. Patient described that this AM he had left sided pain and dyspnea 2/2 pain. He also noted right sided chest pain as well as dizziness. With soft Bps, he was given fluids. No signs of shock. He has maintained fairly good blood pressure and he's had close pharmacy follow up for HF medications.  Notably his stroke was left MCA infarct. MRI showed multifocal acute ischemic infarcts in left greater than right cerebral hemispheres and cerebellum, chronic microvascular ischemic disease, 1.4 cm left vestibular schwannoma. With concern for  cardioembolic source, coumadin was initiated. TEE negative for LAA thrombus. No LV thrombus.   NSR, ;Mild TWI inferolaterally Troponin flat Lowest SBP 90s; typically loww 100s to 120s Crt stable 1.3-1.4     Past Medical History:  Diagnosis Date   Asthma    Brain tumor [facility])     Past Surgical History:  Procedure Laterality Date   BUBBLE STUDY  09/11/2021   Procedure: BUBBLE STUDY;  Surgeon: Fay Records, MD;  Location: [facility] ENDOSCOPY;  Service: Cardiovascular;;   TEE WITHOUT CARDIOVERSION N/A 09/11/2021   Procedure: TRANSESOPHAGEAL ECHOCARDIOGRAM (TEE);  Surgeon: Fay Records, MD;  Location: [facility] ENDOSCOPY;  Service: Cardiovascular;  Laterality: N/A;       Inpatient Medications: Scheduled Meds:  atorvastatin  40 mg Oral Daily   carvedilol  3.125 mg Oral BID WC   empagliflozin  10 mg Oral Daily   folic acid  1 mg Oral Daily   gabapentin  300 mg Oral BID   isosorbide-hydrALAZINE  1 tablet Oral TID   melatonin  5 mg Oral QHS   multivitamin  1 tablet Oral Daily   nicotine  14 mg Transdermal Daily   pantoprazole  40 mg Oral Daily   sacubitril-valsartan  1 tablet Oral BID   thiamine  100 mg Oral Daily   warfarin  5 mg Oral Q M,W,F-1800   warfarin  7.5 mg Oral Once per day on Sun Tue Thu Sat   Warfarin - Pharmacist Dosing Inpatient   Does not apply q1600   Continuous Infusions:  PRN Meds: acetaminophen **OR** acetaminophen (TYLENOL) oral liquid 160  mg/5 mL **OR** acetaminophen, senna-docusate, sodium chloride, traMADol, warfarin  Allergies:    Allergies  Allergen Reactions   Peanut-Containing Drug Products Itching   Shellfish Allergy Swelling    Social History:   Social History   Socioeconomic History   Marital status: Significant Other    Spouse name: Not on file   Number of children: 7   Years of education: Not on file   Highest education level: 9th grade  Occupational History   Occupation: unemployed    Comment: experiencing homelessness-living at daily rent  motels.  Tobacco Use   Smoking status: Every Day    Packs/day: 1.00    Years: 41.00    Pack years: 41.00    Types: Cigarettes    Passive exposure: Past   Smokeless tobacco: Never  Vaping Use   Vaping Use: Never used  Substance and Sexual Activity   Alcohol use: Yes    Alcohol/week: 63.0 standard drinks    Types: 63 Cans of beer per week    Comment: 3-40oz beer/day x10 years. 1-2 40oz beer/day in 20s/30s   Drug use: Yes    Types: "Crack" cocaine    Comment: last use 09/06/21   Sexual activity: Not on file  Other Topics Concern   Not on file  Social History Narrative   Not on file   Social Determinants of Health   Financial Resource Strain: High Risk   Difficulty of Paying Living Expenses: Very hard  Food Insecurity: No Food Insecurity   Worried About Charity fundraiser in the Last Year: Never true   Ran Out of Food in the Last Year: Never true  Transportation Needs: Unmet Transportation Needs   Lack of Transportation (Medical): Yes   Lack of Transportation (Non-Medical): Yes  Physical Activity: Not on file  Stress: Not on file  Social Connections: Not on file  Intimate Partner Violence: Not on file    Family History:   History reviewed. No pertinent family history.   ROS:  Please see the history of present illness.   All other ROS reviewed and negative.     Physical Exam/Data:   Vitals:   09/27/21 0338 09/27/21 0845 09/27/21 1033 09/27/21 1126  BP: 110/83 110/75 99/64 102/64  Pulse: (!) 102 (!) 106 97 (!) 101  Resp: '17 20 15 16  '$ Temp: (!) 97.5 F (36.4 C) 98.4 F (36.9 C)  98.4 F (36.9 C)  TempSrc:  Oral  Oral  SpO2: 100% 100% 97% 100%  Weight:      Height:        Intake/Output Summary (Last 24 hours) at 09/27/2021 1619 Last data filed at 09/27/2021 0800 Gross per 24 hour  Intake 480 ml  Output --  Net 480 ml   Last 3 Weights 09/11/2021 09/08/2021 09/08/2021  Weight (lbs) 176 lb 5.9 oz 176 lb 5.9 oz 176 lb 5.9 oz  Weight (kg) 80 kg 80 kg 80 kg      Body mass index is 29.35 kg/m.  General:  Well nourished, well developed, in no acute distress HEENT: normal Neck: no JVD Vascular: No carotid bruits; Distal pulses 2+ bilaterally Cardiac:  normal S1, S2; RRR; no murmur  Lungs:  clear to auscultation bilaterally, no wheezing, rhonchi or rales  Abd: soft, nontender, no hepatomegaly  Ext: no edema Musculoskeletal:   no LE edema Skin: warm and dry  Neuro:  CNs 2-12 intact, RUE 0/5 strength, Psych:  Normal affect   EKG:  The EKG was personally reviewed and  demonstrates:   NSR, minimal inferolateral TWI; likely repol. with minimal LVH criteria  Telemetry:  Telemetry was personally reviewed and demonstrates:  none  Relevant CV Studies: TTE 09/08/2021  1. Left ventricular ejection fraction, by estimation, is 25 to 30%. The  left ventricle has severely decreased function. The left ventricle  demonstrates global hypokinesis. Left ventricular diastolic parameters are  consistent with Grade II diastolic  dysfunction (pseudonormalization).   2. Right ventricular systolic function is mildly reduced. The right  ventricular size is normal. There is moderately elevated pulmonary artery  systolic pressure. The estimated right ventricular systolic pressure is  41.6 mmHg.   3. Left atrial size was mild to moderately dilated.   4. Right atrial size was mildly dilated.   5. The mitral valve is rheumatic. Mild to moderate mitral valve  regurgitation. Moderate mitral stenosis. The mean mitral valve gradient is  6.0 mmHg with MVA 1.3 cm^2 by VTI.   6. Tricuspid valve regurgitation is moderate to severe.   7. The aortic valve is tricuspid. Aortic valve regurgitation is not  visualized. Aortic valve sclerosis/calcification is present, without any  evidence of aortic stenosis.   8. The inferior vena cava is dilated in size with <50% respiratory  variability, suggesting right atrial pressure of 15 mmHg.   - Underwent TEE 2/20, showed restricted  leaflets c/f  rheumatic heart disease. MVA area by planimetry 1.45 cm2 which is severe. Moderate MR EOA 21  Laboratory Data:  High Sensitivity Troponin:   Recent Labs  Lab 09/08/21 1031 09/08/21 1303 09/27/21 1109 09/27/21 1257  TROPONINIHS 406* 312* 21* 19*     Chemistry Recent Labs  Lab 09/22/21 0518 09/26/21 0121 09/27/21 0258  NA 137 134* 135  K 3.9 4.4 4.6  CL 106 101 103  CO2 '23 26 24  '$ GLUCOSE 122* 109* 105*  BUN 30* 29* 37*  CREATININE 1.30* 1.42* 1.46*  CALCIUM 9.3 9.5 9.1  GFRNONAA >60 >60 59*  ANIONGAP '8 7 8    '$ No results for input(s): PROT, ALBUMIN, AST, ALT, ALKPHOS, BILITOT in the last 168 hours. Lipids No results for input(s): CHOL, TRIG, HDL, LABVLDL, LDLCALC, CHOLHDL in the last 168 hours.  Hematology Recent Labs  Lab 09/25/21 0300 09/26/21 0121 09/27/21 0258  WBC 7.9 9.3 8.0  RBC 4.54 4.38 4.10*  HGB 12.9* 12.3* 11.4*  HCT 39.7 39.1 36.1*  MCV 87.4 89.3 88.0  MCH 28.4 28.1 27.8  MCHC 32.5 31.5 31.6  RDW 14.1 14.1 14.3  PLT 497* 501* 470*   Thyroid No results for input(s): TSH, FREET4 in the last 168 hours.  BNPNo results for input(s): BNP, PROBNP in the last 168 hours.  DDimer No results for input(s): DDIMER in the last 168 hours.   Radiology/Studies:  No results found.   Assessment and Plan:   Hypotension: although his Svi is low 24 cc/m2, SBPs have mainly been normal. His renal function is stable. He is not in decompensated CHF. I think he is tolerating GDMT. -- we discussed spacing out his AM medications to see if that helps with acute dizziness he noted this AM.  - if SBP persistently < 90 ; can hold BB and check lactate; however he has been stable for several weeks  #Acute ischemic stroke: Noted to have left MCA infarct with residual right sided hemiparesis, also had patchy multifocal acute ischemic infarcts in the L/R cerebral hemispheres and cerebellum. TEE did not show thrombus in LAA or LV. No PFO. However, with high risk, will  recommend continuing anticoagulation. No known hx of atrial fibrillation, no afib here. Residual right sided hemiparesis with 0/5 strength of the RUE - he's not monitored. But no afib found prior. 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 Patient reports strep infection as a child treated with PCN - underwent TEE 2/20, showed restricted leaflets c/f  possible rheumatic heart disease. MVA area by planimetry 1.45 cm2 which is severe. Moderate MR EOA 21. Wilkins ~ 9. Will plan for referral to [facility] for mitral valve replacement as an outpatient. If had afib, would recommend MAZE/ LAA excision.   #New onset NICM, EF 25-30%;Dilated cardiomyopathy likely in the setting of uncontrolled hypertension as well as cocaine abuse and etoh. Complicated by underfilling of the LV with severe MS. He did not present with ACS and he continues to not have signs of this. -Svi is low. However, his SBP at typically fine.  - can plan for outpatient LHC/RHC prior to evaluation for surgery ; he has an appointment with me 10/10/2021, hope he can be discharged somewhere, so he can be seen in clinic. - he remains euvolemic, was not significantly decompensated - continue entresto  -Continue Coreg 12.5 mg twice daily.   -BiDil 20-37.5 mg 3 times daily   -continue Jardiance 10 mg daily.  -Goal for substance abuse cessation - continue statin - will need pharmacy assistance on dispo with home instability.    Risk Assessment/Risk Scores:                For questions or updates, please contact [place] Please consult [email or web address] for contact info under     Signed, Janina Mayo, MD  09/27/2021 4:19 PM

## 2021-09-27 NOTE — Progress Notes (Signed)
STROKE TEAM PROGRESS NOTE  ? ?INTERVAL HISTORY ?No family at bedside.  Patient lying in bed, per RN patient was complaining of chest pain and shortness of breath earlier.  Patient told me that he was covering himself with bed sheets and he had sudden onset chest pain, not able to breath. EKG no change from prior, troponin 21. His BP 90s. Put him bed rest, IV bolus and BP up to 100s. Of note, he took all his CHF meds not too long before the episode. Currently BP meds on hold and will re-consult cardiology for meds management.  ? ? ?Vitals:  ? 09/27/21 0338 09/27/21 0845 09/27/21 1033 09/27/21 1126  ?BP: 110/83 110/75 99/64 102/64  ?Pulse: (!) 102 (!) 106 97 (!) 101  ?Resp: '17 20 15 16  '$ ?Temp: (!) 97.5 ?F (36.4 ?C) 98.4 ?F (36.9 ?C)  98.4 ?F (36.9 ?C)  ?TempSrc:  Oral  Oral  ?SpO2: 100% 100% 97% 100%  ?Weight:      ?Height:      ? ?CBC:  ?Recent Labs  ?Lab 09/26/21 ?0121 09/27/21 ?5056  ?WBC 9.3 8.0  ?HGB 12.3* 11.4*  ?HCT 39.1 36.1*  ?MCV 89.3 88.0  ?PLT 501* 470*  ? ?Basic Metabolic Panel:  ?Recent Labs  ?Lab 09/26/21 ?0121 09/27/21 ?9794  ?NA 134* 135  ?K 4.4 4.6  ?CL 101 103  ?CO2 26 24  ?GLUCOSE 109* 105*  ?BUN 29* 37*  ?CREATININE 1.42* 1.46*  ?CALCIUM 9.5 9.1  ? ?Lipid Panel:  ?No results for input(s): CHOL, TRIG, HDL, CHOLHDL, VLDL, LDLCALC in the last 168 hours. ? ?HgbA1c:  ?No results for input(s): HGBA1C in the last 168 hours. ? ?Urine Drug Screen:  ?No results for input(s): LABOPIA, COCAINSCRNUR, LABBENZ, AMPHETMU, THCU, LABBARB in the last 168 hours.  ?Alcohol Level  ?No results for input(s): ETH in the last 168 hours. ? ?IMAGING past 24 hours ?No results found. ? ?PHYSICAL EXAM ?General:  Alert, awake.  ? ? ?NEURO:  ?Mental Status: AA&Ox3  ?Speech/Language: speech is without dysarthria or aphasia.   ? ?Cranial Nerves:  ?II: PERRL. Visual fields full.  ?III, IV, VI: EOMI. Eyelids elevate symmetrically.  ?V: normal sensation on left, mildly decreased on right. ?VII: mild right facial droop  ?VIII: hearing  intact to voice. ?IX, X:  Phonation is normal.  ?XII: tongue is midline without fasciculations. ?Motor: 5/5 strength to LUE and LLE, 3/5 strength to RUE and 3/5 strength to RLE ?Tone: is normal and bulk is normal ?Sensation- right sided decreased sensation present ?Coordination: FTN intact on left ?Gait- deferred ? ?Right foot swelling noted but mild. No evidence of infection. ? ?ASSESSMENT/PLAN ?Craig Schmidt is a 51 y.o. male with history of schwannoma, cocaine use and smoking presenting with sudden onset right sided weakness and slurred speech.  He was given TNK in the ED.  No LVO was seen on CTA head, so thrombectomy could not be performed.  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  Coumadin was started, and TEE completed.  ? 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 Generalized volume loss and left cerebellopontine angle vestibular schwannoma ASPECTS 10.    ?CTA head & neck negative for LVO ?MRI  patchy multifocal acute ischemic infarcts in left greater than right cerebral hemispheres and cerebellum, chronic microvascular ischemic disease, 1.4 cm left vestibular schwannoma ?2D Echo EF 25-30%,  global left ventricular hypokinesis, grade 2 diastolic dysfunction, rheumatic mitral valve with moderate mitral stenosis, moderate to severe tricuspid regurgitation, dilated right and left atria, no atrial level shunt ?TEE showed no LV thrombus, LAA spontaneous contrast, mild MR and severe MS, questionable rheumatic etiology ?Cardiology recommend 30 day monitoring to rule out afib as outpt ?LDL 77 ?HgbA1c 5.6 ?VTE prophylaxis - lovenox ?No antithrombotic prior to admission, now on warfarin daily and INR therapeutic. ?Therapy recommendations:   pending ?Disposition:  pending ? ?CP and SOB in the setting of hypotension ?Episode occurred 3/8 with low BP ?EKG  no change from prior ?Troponin level 21 ?Hold off CHF/BP meds for now ?Reconsult cardiology for medication adjustment. ? ?Hypertension ?Home meds:  none ?Stable ?BiDil and Coreg started by cardiology ?Coreg reduced to 3.25 mg BID due to hypotension ?Also on entrestro ?Long-term BP goal normotensive ? ?Hyperlipidemia ?Home meds:  none ?LDL 77, goal < 70 ?Continue atorvastatin 40 mg daily  ?Continue statin at discharge ? 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  ? 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 ? ?Tobacco abuse ?Current smoker ?Smoking cessation counseling provided ?Nicotine patch provided, dose decreased to 14 mg 3/6, decrease again on 3/20 and discontinue on 4/3 ?Pt is willing to quit ? ?Cocaine abuse ?Cocaine cessation education provided ?Patient is willing to quit ?Okay with Coreg per cardiology ? ?Alcohol abuse ?On FA/B1/MVI ?Watch for alcohol withdraw symptoms ? ?Other Stroke Risk Factors ?Drug abuse ? ?Other Active Problems ? b/l feet/leg pain-controlled. ?con't gabapentin 300 BID. ?Melatonin '3mg'$  -> '5mg'$   ?U/S neg for DVT. ? ?Hospital day # 19 ? ?I spent extensive time with the patient, more than 50% of which was spent in counseling and coordination of care, reviewing medical charts, test results, images and medication, and discussing the diagnosis, treatment plan and potential prognosis. This patient's care requiresreview of multiple  databases, neurological assessment, discussion with family, other specialists and medical decision making of high complexity. ? ?  ?[name], MD PhD ?Stroke Neurology ?09/27/2021 ?1:02 PM ? ? ?To contact Stroke Continuity provider, please refer to [email or web address] ?After hours, contact General Neurology  ?

## 2021-09-27 NOTE — Progress Notes (Signed)
Physical Therapy Treatment ?Patient Details ?Name: Craig Schmidt ?MRN: [id number] ?DOB: 09/13/1970 ?Today's Date: 09/27/2021 ? ? ?History of Present Illness 51 y.o. male presented to the emergency room for sudden onset of right-sided weakness. TNKase given. 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 PHMx:schwannoma, cocaine abuse which he says he has not used over the past few weeks. ? ?  ?PT Comments  ? ? Per RN, pt with low BP and chest pain earlier this morning and on bed rest for today while working pt up but cleared bed level exercises. Focused on R LE strengthening this session. Acute PT to cont to follow.   ?Recommendations for follow up therapy are one component of a multi-disciplinary discharge planning process, led by the attending physician.  Recommendations may be updated based on patient status, additional functional criteria and insurance authorization. ? ?Follow Up Recommendations ? Acute inpatient rehab (3hours/day) ?  ?  ?Assistance Recommended at Discharge Intermittent Supervision/Assistance  ?Patient can return home with the following Assistance with cooking/housework;Assist for transportation;Help with stairs or ramp for entrance;A little help with walking and/or transfers ?  ?Equipment Recommendations ? Other (comment)  ?  ?Recommendations for Other Services   ? ? ?  ?Precautions / Restrictions Precautions ?Precautions: Fall ?Precaution Comments: R UE flaccid ?Required Braces or Orthoses: Sling ?Restrictions ?Weight Bearing Restrictions: No ?Other Position/Activity Restrictions: R UE flaccid  ?  ? ?Mobility ? Bed Mobility ?  ?  ?  ?  ?  ?  ?  ?General bed mobility comments: on bed rest today for chest pain earlier ?  ? ?Transfers ?  ?  ?  ?  ?  ?  ?  ?  ?  ?General transfer comment: on bed rest today for chest pain earlier ?   ? ?Ambulation/Gait ?  ?  ?  ?  ?  ?  ?  ?  ? ? ?Stairs ?  ?  ?  ?  ?General stair comments: on bed rest today for chest pain earlier ? ? ?Wheelchair Mobility ?  ? ?Modified Rankin (Stroke Patients Only) ?Modified Rankin (Stroke Patients Only) ?Pre-Morbid Rankin Score: No symptoms ?Modified Rankin: Moderately severe disability ? ? ?  ?Balance   ?  ?  ?  ?  ?  ?  ?  ?  ?  ?  ?  ?  ?  ?  ?  ?  ?  ?  ?  ? ?  ?Cognition Arousal/Alertness: Awake/alert ?Behavior During Therapy: [facility] for tasks assessed/performed ?Overall Cognitive Status: Within Functional Limits for tasks assessed ?  ?  ?  ?  ?  ?  ?  ?  ?  ?  ?  ?  ?  ?  ?  ?  ?  ?  ?  ? ?  ?Exercises General Exercises - Lower Extremity ?Quad Sets: AROM, Right, 10 reps ?Gluteal Sets: AROM, Both, 20 reps ?Hip ABduction/ADduction: AROM, Right, 15 reps, Supine ?Straight Leg Raises: AROM, Right, 20 reps, Supine ?Hip Flexion/Marching: AROM, Right, 10 reps, Supine ? ?  ?General Comments General comments (skin integrity, edema, etc.): VSS ?  ?  ? ?Pertinent Vitals/Pain    ? ? ?Home Living   ?  ?  ?  ?  ?  ?  ?  ?  ?  ?   ?  ?Prior Function    ?  ?  ?   ? ?  PT Goals (current goals can now be found in the care plan section) Acute Rehab PT Goals ?PT Goal Formulation: With patient ?Time For Goal Achievement: 10/07/21 ?Potential to Achieve Goals: Fair ?Progress towards PT goals: Progressing toward goals ? ?  ?Frequency ? ? ? Min 4X/week ? ? ? ?  ?PT Plan Current plan remains appropriate  ? ? ?Co-evaluation   ?  ?  ?  ?  ? ?  ?AM-PAC PT "6 Clicks" Mobility   ?Outcome Measure ? Help needed turning from your back to your side while in a flat bed without using bedrails?: A Little ?Help needed moving from lying on your back to sitting on the side of a flat bed without using bedrails?: A Little ?Help needed moving to and from a bed to a chair (including a wheelchair)?: A Little ?Help needed standing up from a chair using your arms (e.g., wheelchair or bedside chair)?: A Little ?Help needed  to walk in hospital room?: A Little ?Help needed climbing 3-5 steps with a railing? : A Little ?6 Click Score: 18 ? ?  ?End of Session Equipment Utilized During Treatment: Gait belt ?Activity Tolerance: Patient tolerated treatment well ?Patient left: in bed;with call bell/phone within reach;with bed alarm set ?Nurse Communication: Mobility status ?PT Visit Diagnosis: Other abnormalities of gait and mobility (R26.89);Muscle weakness (generalized) (M62.81);Hemiplegia and hemiparesis ?Hemiplegia - Right/Left: Right ?Hemiplegia - dominant/non-dominant: Dominant ?Hemiplegia - caused by: Cerebral infarction ?  ? ? ?Time: 2694-8546 ?PT Time Calculation (min) (ACUTE ONLY): 17 min ? ?Charges:  $Therapeutic Exercise: 8-22 mins          ?          ? ?Kittie Plater, PT, DPT ?Acute Rehabilitation Services ?Pager #: [phone] ?Office #: [phone] ? ? ? ?Craig Schmidt ?09/27/2021, 2:42 PM ? ?

## 2021-09-27 NOTE — Progress Notes (Signed)
Heart Failure Stewardship Pharmacist Progress Note ? ? ?PCP: Placey, Audrea Muscat, NP ?PCP-Cardiologist: None  ? ? ?HPI:  ?51 yo M with PMH of vestibular schwannoma, asthma, alcohol and cocaine use. He presented to the ED on 2/17 as a code stroke. Received TNK in ED. CXR with pulmonary vascular congestion and mild pulmonary edema. ECHO done on 2/17 with LVEF 25-30%, G2DD, and mildly reduced RV as well as severe mitral stenosis, mild-moderate mitral regurgitation, and moderate-severe tricuspid regurgitation. Suspected rheumatic valve disease. TEE on 2/20 with mild tricuspid regurgitation and findings concerning for rheumatic heart disease but no extensive chordal thickening.  ? ?Current HF Medications: ?Beta Blocker: carvedilol 3.125 mg BID ?ACE/ARB/ARNI: Delene Loll 24/26 mg BID ?SGLT2i: Jardiance 10 mg daily ?Other: BiDil 1 tab TID ? ?Prior to admission HF Medications: ?None ? ?Pertinent Lab Values: ?Serum creatinine 1.46, BUN 37, Potassium 4.6, Sodium 135, BNP 1570, A1c 5.6  ? ?Vital Signs: ?Weight: 176 lbs (admission weight: 176 lbs) ?Blood pressure: 90/50s-110/80s  ?Heart rate: 90-100s  ?I/O not documented ? ?Medication Assistance / Insurance Benefits Check: ?Does the patient have prescription insurance?  No ? ?Does the patient qualify for medication assistance through manufacturers or grants?   Pending ?Eligible grants and/or patient assistance programs: Entresto/Jardiance if meets income requirements ?Medication assistance applications in progress: none  ?Medication assistance applications approved: none ?Approved medication assistance renewals will be completed by: TBD ? ?Outpatient Pharmacy:  ?Prior to admission outpatient pharmacy: [facility] ?Is the patient willing to use [facility] at discharge? Yes ?Is the patient willing to transition their outpatient pharmacy to utilize a [facility] outpatient pharmacy?   Pending ?  ? ?Assessment: ?1. Acute systolic CHF (EF 40-98%), due to presumed NICM in the setting of  uncontrolled HTN and cocaine/alcohol use. NYHA class II symptoms. ?- Continue carvedilol 3.125 mg BID ?- Continue Entresto 24/26 mg BID ?- Consider adding spironolactone 12.5 mg daily if K/BP allow ?- Continue Jardiance 10 mg daily ?- Continue BiDil 1 tab TID ?- LHC planned as outpatient - discussed with cardiology team and there are no financial concerns with insurance status  ?  ?Plan: ?1) Medication changes recommended at this time: ?- Continue current regimen ? ?2) Patient assistance: ?- Uninsured - HF TOC appt scheduled and will help with pt assistance at that time. Medicaid pending ? ?3)  Education  ?- To be completed prior to discharge ? ?Kerby Nora, PharmD, BCPS ?Heart Failure Stewardship Pharmacist ?Phone [phone] ? ? ?

## 2021-09-27 NOTE — Progress Notes (Signed)
ANTICOAGULATION CONSULT NOTE - Follow Up Consult ? ?Pharmacy Consult for Warfarin ?Indication:  rheumatic valve stenosis; CVA ? ?Allergies  ?Allergen Reactions  ? Peanut-Containing Drug Products Itching  ? Shellfish Allergy Swelling  ? ? ?Patient Measurements: ?Height: '5\' 5"'$  (165.1 cm) ?Weight: 80 kg (176 lb 5.9 oz) ?IBW/kg (Calculated) : 61.5 ? ?Vital Signs: ?Temp: 98.4 ?F (36.9 ?C) (03/08 1126) ?Temp Source: Oral (03/08 1126) ?BP: 102/64 (03/08 1126) ?Pulse Rate: 101 (03/08 1126) ? ?Labs: ?Recent Labs  ?  09/25/21 ?0300 09/26/21 ?0121 09/27/21 ?0258 09/27/21 ?1109  ?HGB 12.9* 12.3* 11.4*  --   ?HCT 39.7 39.1 36.1*  --   ?PLT 497* 501* 470*  --   ?LABPROT 24.1* 25.6* 27.9*  --   ?INR 2.2* 2.3* 2.6*  --   ?CREATININE  --  1.42* 1.46*  --   ?TROPONINIHS  --   --   --  21*  ? ? ? ?Estimated Creatinine Clearance: 59.6 mL/min (A) (by C-G formula based on SCr of 1.46 mg/dL (H)). ? 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 Pharmacy consulted to dose warfarin on 2/18. S/p TNK on 2/17 at ~9:30am. TEE negative for thrombus and Cardiology recommends 30-day cardiac even monitor as outpatient to rule out atrial fibrillation.  ? ?  INR 2.3 > 2.6. Remains therapeutic. ?  Warfarin 5 mg MWF and 7.5 mg TTSS begun 3/3, so 5 mg given on 3/3 (Friday) and 7.5 mg doses given over the weekend (3/4 and 3/5). INR trended down to 2.2 on 3/6 (Monday), so Warfarin 7.5 mg given rather than 5 mg, then 7.5 mg again 3/7.  ? ?Goal of Therapy:  ?INR 2-3 ?Monitor platelets by anticoagulation protocol: Yes ?  ?Plan:  ?Continue Warfarin 5 mg MWF and 7.5 mg TTSS. 5 mg due today. ?Monitoring for any need to adjust regimen.  ?Daily PT/INR ?Change to less frequent monitoring when INR stable. ? ?Arty Baumgartner, RPh ?09/27/2021,12:58 PM ? ? ?

## 2021-09-28 LAB — BASIC METABOLIC PANEL
Anion gap: 10 (ref 5–15)
BUN: 34 mg/dL — ABNORMAL HIGH (ref 6–20)
CO2: 23 mmol/L (ref 22–32)
Calcium: 9.3 mg/dL (ref 8.9–10.3)
Chloride: 104 mmol/L (ref 98–111)
Creatinine, Ser: 1.28 mg/dL — ABNORMAL HIGH (ref 0.61–1.24)
GFR, Estimated: >60 mL/min (ref >60)
Glucose, Bld: 111 mg/dL — ABNORMAL HIGH (ref 70–99)
Potassium: 4.7 mmol/L (ref 3.5–5.1)
Sodium: 137 mmol/L (ref 135–145)

## 2021-09-28 LAB — CBC
HCT: 37.4 % — ABNORMAL LOW (ref 39.0–52.0)
Hemoglobin: 11.9 g/dL — ABNORMAL LOW (ref 13.0–17.0)
MCH: 28.1 pg (ref 26.0–34.0)
MCHC: 31.8 g/dL (ref 30.0–36.0)
MCV: 88.2 fL (ref 80.0–100.0)
Platelets: 436 10*3/uL — ABNORMAL HIGH (ref 150–400)
RBC: 4.24 MIL/uL (ref 4.22–5.81)
RDW: 14.2 % (ref 11.5–15.5)
WBC: 8.5 10*3/uL (ref 4.0–10.5)
nRBC: 0 % (ref 0.0–0.2)

## 2021-09-28 LAB — PROTIME-INR
INR: 2.6 — ABNORMAL HIGH (ref 0.8–1.2)
Prothrombin Time: 28.1 seconds — ABNORMAL HIGH (ref 11.4–15.2)

## 2021-09-28 MED ORDER — SACUBITRIL-VALSARTAN 24-26 MG PO TABS
1.0000 | ORAL_TABLET | Freq: Two times a day (BID) | ORAL | Status: DC
  Administered 2021-09-28 – 2021-10-03 (×9): 1 via ORAL
  Filled 2021-09-28 (×13): qty 1

## 2021-09-28 NOTE — Progress Notes (Signed)
? ?Progress Note ? ?Patient Name: Craig Schmidt ?Date of Encounter: 09/28/2021 ? ?Primary Cardiologist: None  ? ?Subjective  ? ?Patient seen examined his bedside.  He was awake when I arrived. ? ?Inpatient Medications  ?  ?Scheduled Meds: ? atorvastatin  40 mg Oral Daily  ? carvedilol  3.125 mg Oral BID WC  ? empagliflozin  10 mg Oral Daily  ? folic acid  1 mg Oral Daily  ? gabapentin  300 mg Oral BID  ? isosorbide-hydrALAZINE  1 tablet Oral TID  ? melatonin  5 mg Oral QHS  ? multivitamin  1 tablet Oral Daily  ? nicotine  14 mg Transdermal Daily  ? pantoprazole  40 mg Oral Daily  ? sacubitril-valsartan  1 tablet Oral BID  ? thiamine  100 mg Oral Daily  ? warfarin  5 mg Oral Q M,W,F-1800  ? warfarin  7.5 mg Oral Once per day on Sun Tue Thu Sat  ? Warfarin - Pharmacist Dosing Inpatient   Does not apply R1540  ? ?Continuous Infusions: ? ?PRN Meds: ?acetaminophen **OR** acetaminophen (TYLENOL) oral liquid 160 mg/5 mL **OR** acetaminophen, senna-docusate, sodium chloride, traMADol, warfarin  ? ?Vital Signs  ?  ?Vitals:  ? 09/27/21 1638 09/27/21 1915 09/28/21 0411 09/28/21 0867  ?BP: 105/86 126/76 (!) 92/57 107/73  ?Pulse: (!) 103  100 92  ?Resp: '15 15 16 20  '$ ?Temp: 98.2 ?F (36.8 ?C) (!) 97.5 ?F (36.4 ?C) 97.9 ?F (36.6 ?C) 98.2 ?F (36.8 ?C)  ?TempSrc: Oral Oral Oral Oral  ?SpO2: 99%  99% 100%  ?Weight:      ?Height:      ? ? ?Intake/Output Summary (Last 24 hours) at 09/28/2021 0826 ?Last data filed at 09/27/2021 1600 ?Gross per 24 hour  ?Intake 820 ml  ?Output 800 ml  ?Net 20 ml  ? ?Filed Weights  ? 09/08/21 0900 09/08/21 1000 09/11/21 1040  ?Weight: 80 kg 80 kg 80 kg  ? ? ?Telemetry  ?  ?None- Personally Reviewed ? ?ECG  ?  ?None- Personally Reviewed ? ?Physical Exam  ?  ?General: Comfortable, sitting up in bed, appears older than stated age. ?Head: Atraumatic, normal size  ?Eyes: PEERLA, EOMI  ?Neck: Supple, normal JVD ?Cardiac: Normal S1, S2; RRR; no murmurs, rubs, or gallops ?Lungs: Clear to auscultation  bilaterally ?Abd: Soft, nontender, no hepatomegaly  ?Ext: warm, no edema ?Musculoskeletal: No deformities, BUE and BLE strength normal and equal ?Skin: Warm and dry, no rashes   ?Neuro: Alert and oriented to person, place, time, and situation, CNII-XII grossly intact, no focal deficits  ?Psych: Normal mood and affect  ? ?Labs  ?  ?Chemistry ?Recent Labs  ?Lab 09/26/21 ?0121 09/27/21 ?6195 09/28/21 ?0109  ?NA 134* 135 137  ?K 4.4 4.6 4.7  ?CL 101 103 104  ?CO2 '26 24 23  '$ ?GLUCOSE 109* 105* 111*  ?BUN 29* 37* 34*  ?CREATININE 1.42* 1.46* 1.28*  ?CALCIUM 9.5 9.1 9.3  ?GFRNONAA >60 59* >60  ?ANIONGAP '7 8 10  '$ ?  ? ?Hematology ?Recent Labs  ?Lab 09/26/21 ?0121 09/27/21 ?0932 09/28/21 ?0109  ?WBC 9.3 8.0 8.5  ?RBC 4.38 4.10* 4.24  ?HGB 12.3* 11.4* 11.9*  ?HCT 39.1 36.1* 37.4*  ?MCV 89.3 88.0 88.2  ?MCH 28.1 27.8 28.1  ?MCHC 31.5 31.6 31.8  ?RDW 14.1 14.3 14.2  ?PLT 501* 470* 436*  ? ? ?Cardiac EnzymesNo results for input(s): TROPONINI in the last 168 hours. No results for input(s): TROPIPOC in the last 168 hours.  ? ?  BNPNo results for input(s): BNP, PROBNP in the last 168 hours.  ? ?DDimer No results for input(s): DDIMER in the last 168 hours.  ? ?Radiology  ?  ?No results found. ? ?Cardiac Studies  ? ?TTE 09/08/2021 ? 1. Left ventricular ejection fraction, by estimation, is 25 to 30%. The  ?left ventricle has severely decreased function. The left ventricle  ?demonstrates global hypokinesis. Left ventricular diastolic parameters are  ?consistent with Grade II diastolic  ?dysfunction (pseudonormalization).  ? 2. Right ventricular systolic function is mildly reduced. The right  ?ventricular size is normal. There is moderately elevated pulmonary artery  ?systolic pressure. The estimated right ventricular systolic pressure is  ?38.1 mmHg.  ? 3. Left atrial size was mild to moderately dilated.  ? 4. Right atrial size was mildly dilated.  ? 5. The mitral valve is rheumatic. Mild to moderate mitral valve  ?regurgitation. Moderate  mitral stenosis. The mean mitral valve gradient is  ?6.0 mmHg with MVA 1.3 cm^2 by VTI.  ? 6. Tricuspid valve regurgitation is moderate to severe.  ? 7. The aortic valve is tricuspid. Aortic valve regurgitation is not  ?visualized. Aortic valve sclerosis/calcification is present, without any  ?evidence of aortic stenosis.  ? 8. The inferior vena cava is dilated in size with <50% respiratory  ?variability, suggesting right atrial pressure of 15 mmHg.  ?  ?- Underwent TEE 2/20, showed restricted leaflets c/f  rheumatic heart disease. MVA area by planimetry 1.45 cm2 which is severe. Moderate MR EOA 21 ? ?Patient Profile  ?   ?51 y.o. 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  Left vestibular schwannoma, he is status post IV TNKase, recently diagnosed systolic heart failure with severe mitral stenosis.  Cardiology was reconsulted yesterday due to hypotension. ? ? ?Assessment & Plan  ?Hypotension-suspect that this was due to medication, his medication dosing has been adjusted.  This has resolved.  His medications were adjusted.  No need for any change today. ? ?Recent CVA-left MCA infarct with residual right-sided hemiparesis also had patchy multiple acute ischemic infarcts in the left the right cerebral hemisphere and cerebellum.  He is status post TEE which did not show any thrombus or PFO.  No known history of atrial fibrillation.  Has been started on Coumadin INR goal 2-3.  May benefit from ambulatory monitoring. ? ? ?Severe mitral stenosis-unclear rheumatic versus parachute mitral valve-further treatment plan in outpatient setting.  With regular heart catheterization as well as plan for intervention and referred to [facility]. ? 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  Right and left heart catheterization is being planned in  the outpatient setting.  For now continue Coreg 3.125 mg twice a day, BiDil 20-37.25 mg 3 times daily, Jardiance 10 mg daily. ? ?Counseled on cessation of substance abuse. ? ? ?[facility] will sign off.   ?Medication Recommendations: Continue carvedilol 3.125 mg twice daily, BiDil 1 tablet 3 times a day, Entresto, Coumadin for INR 2-3. ?Other recommendations (labs, testing, etc): We will need outpatient right and left heart catheterization ?Follow up as an outpatient: Follow-up with cardiology as scheduled with Dr. Harl Bowie. ? ?For questions or updates, please contact [place] ?Please consult [email or web address] for contact info under Cardiology/STEMI. ?  ?   ?Signed, ?Berniece Salines, DO  ?09/28/2021, 8:26 AM    ?

## 2021-09-28 NOTE — Progress Notes (Signed)
ANTICOAGULATION CONSULT NOTE - Follow Up Consult ? ?Pharmacy Consult for Warfarin ?Indication:  rheumatic valve stenosis; CVA ? ?Allergies  ?Allergen Reactions  ? Peanut-Containing Drug Products Itching  ? Shellfish Allergy Swelling  ? ? ?Patient Measurements: ?Height: '5\' 5"'$  (165.1 cm) ?Weight: 80 kg (176 lb 5.9 oz) ?IBW/kg (Calculated) : 61.5 ? ?Vital Signs: ?Temp: 98.2 ?F (36.8 ?C) (03/09 0240) ?Temp Source: Oral (03/09 9735) ?BP: 107/73 (03/09 3299) ?Pulse Rate: 92 (03/09 0811) ? ?Labs: ?Recent Labs  ?  09/26/21 ?0121 09/27/21 ?0258 09/27/21 ?1109 09/27/21 ?1257 09/28/21 ?0109  ?HGB 12.3* 11.4*  --   --  11.9*  ?HCT 39.1 36.1*  --   --  37.4*  ?PLT 501* 470*  --   --  436*  ?LABPROT 25.6* 27.9*  --   --  28.1*  ?INR 2.3* 2.6*  --   --  2.6*  ?CREATININE 1.42* 1.46*  --   --  1.28*  ?TROPONINIHS  --   --  21* 19*  --   ? ? ? ?Estimated Creatinine Clearance: 68 mL/min (A) (by C-G formula based on SCr of 1.28 mg/dL (H)). ? 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 Pharmacy consulted to dose warfarin on 2/18. S/p TNK on 2/17 at ~9:30am. TEE negative for thrombus and Cardiology recommends 30-day cardiac even monitor as outpatient to rule out atrial fibrillation.  ? ?  INR 2.3 > 2.6>2.6. Remains therapeutic. ?  Warfarin 5 mg MWF and 7.5 mg TTSS begun 3/3, so 5 mg given on 3/3 (Friday) and 7.5 mg doses given over the weekend (3/4 and 3/5). INR trended down to 2.2 on 3/6 (Monday), so Warfarin 7.5 mg given rather than 5 mg, then back to standing regimen on 3/7. ? ?Goal of Therapy:  ?INR 2-3 ?Monitor platelets by anticoagulation protocol: Yes ?  ?Plan:  ?Continue Warfarin 5 mg MWF and 7.5 mg TTSS. 7.5 mg due today. ?Monitoring for any need to adjust regimen.  ?Daily PT/INR ?Change to less frequent monitoring when INR stable. ? ?Arty Baumgartner, RPh ?09/28/2021,11:53 AM ? ? ?

## 2021-09-28 NOTE — Progress Notes (Signed)
Physical Therapy Treatment ?Patient Details ?Name: Craig Schmidt ?MRN: [id number] ?DOB: 07/08/1971 ?Today's Date: 09/28/2021 ? ? ?History of Present Illness 51 y.o. male presented to the emergency room for sudden onset of right-sided weakness. TNKase given. 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 PHMx:schwannoma, cocaine abuse which he says he has not used over the past few weeks. ? ?  ?PT Comments  ? ? Pt progressing well toward goals, yet will still be a hard d/c.  Emphasis on adjusting GivMohr sling and working on self donning, and working on Personnel officer with a [place]. ?   ?Recommendations for follow up therapy are one component of a multi-disciplinary discharge planning process, led by the attending physician.  Recommendations may be updated based on patient status, additional functional criteria and insurance authorization. ? ?Follow Up Recommendations ? Acute inpatient rehab (3hours/day) ?  ?  ?Assistance Recommended at Discharge Intermittent Supervision/Assistance  ?Patient can return home with the following Assistance with cooking/housework;Assist for transportation;Help with stairs or ramp for entrance;A little help with walking and/or transfers ?  ?Equipment Recommendations ?  (TBD)  ?  ?Recommendations for Other Services   ? ? ?  ?Precautions / Restrictions Precautions ?Precautions: Fall ?Precaution Comments: R UE flaccid ?Required Braces or Orthoses: Sling ?Restrictions ?Weight Bearing Restrictions: No  ?  ? ?Mobility ? Bed Mobility ?Overal bed mobility: Modified Independent ?Bed Mobility: Supine to Sit ?  ?  ?Supine to sit: Modified independent (Device/Increase time) ?Sit to supine: Modified independent (Device/Increase time) ?  ?  ?  ? ?Transfers ?Overall transfer level: Needs assistance ?Equipment used: None, Straight cane ?Transfers: Sit to/from  Stand ?Sit to Stand: Supervision ?Stand pivot transfers: Supervision ?  ?  ?  ?  ?General transfer comment: cues for safety only ?  ? ?Ambulation/Gait ?Ambulation/Gait assistance: Supervision ?Gait Distance (Feet): 400 Feet ?Assistive device: None, Straight cane ?Gait Pattern/deviations: Step-through pattern ?  ?Gait velocity interpretation: 1.31 - 2.62 ft/sec, indicative of limited community ambulator ?  ?General Gait Details: safe use of the SPC in the L hand to add safety for mildly paretic, low clearance of R LE swing through and for uneven surfaces ? ? ?Stairs ?  ?  ?  ?  ?  ? ? ?Wheelchair Mobility ?  ? ?Modified Rankin (Stroke Patients Only) ?Modified Rankin (Stroke Patients Only) ?Modified Rankin: Moderate disability ? ? ?  ?Balance   ?  ?Sitting balance-Leahy Scale: Good ?  ?  ?  ?Standing balance-Leahy Scale: Fair ?  ?  ?  ?  ?  ?  ?  ?  ?  ?  ?  ?  ?  ? ?  ?Cognition Arousal/Alertness: Awake/alert ?Behavior During Therapy: [facility] for tasks assessed/performed ?Overall Cognitive Status: Within Functional Limits for tasks assessed ?  ?  ?  ?  ?  ?  ?  ?  ?  ?  ?  ?  ?  ?  ?  ?  ?  ?  ?  ? ?  ?Exercises   ? ?  ?General Comments General comments (skin integrity, edema, etc.): Spent time donning and adjusting the GivMohr sling.  Started teaching self donning, but need more reinforcement ?  ?  ? ?Pertinent Vitals/Pain Pain Assessment ?Pain Assessment: Faces ?Faces Pain Scale: No hurt ?Pain Intervention(s): Monitored during session, Other (comment) (having the sling on)  ? ? ?Home Living   ?  ?  ?  ?  ?  ?  ?  ?  ?  ?   ?  ?  Prior Function    ?  ?  ?   ? ?PT Goals (current goals can now be found in the care plan section) Acute Rehab PT Goals ?Patient Stated Goal: get the R arm working, but if not a good sling. ?PT Goal Formulation: With patient ?Time For Goal Achievement: 10/07/21 ?Potential to Achieve Goals: Fair ? ?  ?Frequency ? ? ? Min 4X/week ? ? ? ?  ?PT Plan Current plan remains appropriate  ? ? ?Co-evaluation    ?  ?  ?  ?  ? ?  ?AM-PAC PT "6 Clicks" Mobility   ?Outcome Measure ? Help needed turning from your back to your side while in a flat bed without using bedrails?: None ?Help needed moving from lying on your back to sitting on the side of a flat bed without using bedrails?: None ?Help needed moving to and from a bed to a chair (including a wheelchair)?: A Little ?Help needed standing up from a chair using your arms (e.g., wheelchair or bedside chair)?: A Little ?Help needed to walk in hospital room?: A Little ?Help needed climbing 3-5 steps with a railing? : A Little ?6 Click Score: 20 ? ?  ?End of Session   ?Activity Tolerance: Patient tolerated treatment well ?Patient left: in chair;with call bell/phone within reach ?Nurse Communication: Mobility status ?PT Visit Diagnosis: Other abnormalities of gait and mobility (R26.89);Other symptoms and signs involving the nervous system (R29.898) ?Hemiplegia - Right/Left: Right ?Hemiplegia - dominant/non-dominant: Dominant ?Hemiplegia - caused by: Cerebral infarction ?  ? ? ?Time: 8366-2947 ?PT Time Calculation (min) (ACUTE ONLY): 36 min ? ?Charges:  $Gait Training: 8-22 mins ?$Therapeutic Activity: 8-22 mins          ?          ? ?09/28/2021 ? ?Ginger Carne., PT ?Acute Rehabilitation Services ?[phone]  (pager) ?[phone]  (office) ? ? ?[name] ?09/28/2021, 4:34 PM ? ?

## 2021-09-28 NOTE — Progress Notes (Addendum)
STROKE TEAM PROGRESS NOTE  ? ?INTERVAL HISTORY ?Patient is seen in his room with no family at the bedside.  He reports that he has been trying to space his medications out today to hopefully prevent another episode of chest pain and weakness.   Had extensive discussion with patient and sister regarding plans for rehabilitation and future cardiac surgery plans. ? ? ?Vitals:  ? 09/27/21 1638 09/27/21 1915 09/28/21 0411 09/28/21 3794  ?BP: 105/86 126/76 (!) 92/57 107/73  ?Pulse: (!) 103  100 92  ?Resp: '15 15 16 20  '$ ?Temp: 98.2 ?F (36.8 ?C) (!) 97.5 ?F (36.4 ?C) 97.9 ?F (36.6 ?C) 98.2 ?F (36.8 ?C)  ?TempSrc: Oral Oral Oral Oral  ?SpO2: 99%  99% 100%  ?Weight:      ?Height:      ? ?CBC:  ?Recent Labs  ?Lab 09/27/21 ?0258 09/28/21 ?0109  ?WBC 8.0 8.5  ?HGB 11.4* 11.9*  ?HCT 36.1* 37.4*  ?MCV 88.0 88.2  ?PLT 470* 436*  ? ? ?Basic Metabolic Panel:  ?Recent Labs  ?Lab 09/27/21 ?0258 09/28/21 ?0109  ?NA 135 137  ?K 4.6 4.7  ?CL 103 104  ?CO2 24 23  ?GLUCOSE 105* 111*  ?BUN 37* 34*  ?CREATININE 1.46* 1.28*  ?CALCIUM 9.1 9.3  ? ? ?Lipid Panel:  ?No results for input(s): CHOL, TRIG, HDL, CHOLHDL, VLDL, LDLCALC in the last 168 hours. ? ?HgbA1c:  ?No results for input(s): HGBA1C in the last 168 hours. ? ?Urine Drug Screen:  ?No results for input(s): LABOPIA, COCAINSCRNUR, LABBENZ, AMPHETMU, THCU, LABBARB in the last 168 hours.  ?Alcohol Level  ?No results for input(s): ETH in the last 168 hours. ? ?IMAGING past 24 hours ?No results found. ? ?PHYSICAL EXAM ?General:  Alert, awake.  ? ? ?NEURO:  ?Mental Status: AA&Ox3  ?Speech/Language: speech is without dysarthria or aphasia.   ? ?Cranial Nerves:  ?II: PERRL. Visual fields full.  ?III, IV, VI: EOMI. Eyelids elevate symmetrically.  ?V: normal sensation on left, mildly decreased on right. ?VII: mild right facial droop  ?VIII: hearing intact to voice. ?IX, X:  Phonation is normal.  ?XII: tongue is midline without fasciculations. ?Motor: 5/5 strength to LUE and LLE, 3/5 strength to RUE  and 4/5 strength to RLE ?Tone: is normal and bulk is normal ?Sensation- right sided decreased sensation present ?Coordination: FTN intact on left ?Gait- deferred ? ?Right foot swelling noted but mild. No evidence of infection. ? ?ASSESSMENT/PLAN ?Mr. KAELOB PERSKY is a 51 y.o. male with history of schwannoma, cocaine use and smoking presenting with sudden onset right sided weakness and slurred speech.  He was given TNK in the ED.  No LVO was seen on CTA head, so thrombectomy could not be performed.  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  Coumadin was started, and TEE completed.  ? 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 Generalized volume loss and left cerebellopontine angle vestibular schwannoma ASPECTS 10.    ?CTA head & neck negative for LVO ?MRI  patchy multifocal acute ischemic infarcts in left greater than right cerebral hemispheres and cerebellum, chronic microvascular ischemic disease, 1.4 cm left vestibular schwannoma ?2D Echo EF 25-30%, global left ventricular hypokinesis, grade 2 diastolic dysfunction, rheumatic mitral valve with moderate mitral stenosis, moderate to severe tricuspid regurgitation, dilated right and left atria, no atrial level shunt ?TEE showed no LV thrombus, LAA  spontaneous contrast, mild MR and severe MS, questionable rheumatic etiology ?Cardiology recommend 30 day monitoring to rule out afib as outpt ?LDL 77 ?HgbA1c 5.6 ?VTE prophylaxis - lovenox ?No antithrombotic prior to admission, now on warfarin daily and INR therapeutic. ?Therapy recommendations:   pending ?Disposition:  pending ? ?CP and SOB in the setting of hypotension ?Episode occurred 3/8 with low BP ?EKG no change from prior ?Troponin level 21 ?Hold off CHF/BP meds if BP < 110 ?Reconsulted cardiology for medication adjustment. ? ?Hypertension ?Home meds:   none ?Stable ?BiDil and Coreg started by cardiology ?Also on entrestro ?Hold meds if SBP < 110 ?Long-term BP goal normotensive ? ?Hyperlipidemia ?Home meds:  none ?LDL 77, goal < 70 ?Continue atorvastatin 40 mg daily  ?Continue statin at discharge ? 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  ? 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 ? ?Tobacco abuse ?Current smoker ?Smoking cessation counseling provided ?Nicotine patch provided, dose decreased to 14 mg 3/6, decrease again on 3/20 and discontinue on 4/3 ?Pt is willing to quit ? ?Cocaine abuse ?Cocaine cessation education provided ?Patient is willing to quit ?Okay with Coreg per cardiology ? ?Alcohol abuse ?On FA/B1/MVI ?Watch for alcohol withdraw symptoms ? ?Other Stroke Risk Factors ?Drug abuse ? ?Other Active Problems ? b/l feet/leg pain-controlled. ?con't gabapentin 300 BID. ?Melatonin '3mg'$  -> '5mg'$   ?U/S neg for DVT. ? ?Hospital day # 20 ? ?[place] , MSN, AGACNP-BC ?[facility] ?See Amion for schedule and pager information ?09/28/2021 1:30 PM ?  ?ATTENDING NOTE: ?I reviewed above note and agree with the assessment and plan. Pt was seen and examined.  ? ?Pt reclining in bed, sister on the phone. No complains today. BP 107/73, still soft, will hold off BP meds if SBP < 110. Cardiology reconsulted for meds adjustment. SW working on placement, but given no insurance and pt was able  to walk independently and he refused to go to shelter, he may be discharged back to street with his girlfriend and dog living in a tent.  ? ?I had long discussion with pt at bedside and his sister over the phone, updated pt current condition, treatment plan and potential prognosis, and answered all the questions. They expressed understanding and appreciation.  ? ?For detailed assessment and plan, please refer to above as I have made changes wherever appropriate.  ? ?[name], MD PhD ?Stroke Neurology ?09/28/2021 ?1:48 PM ? ? ? ?To contact Stroke Continuity provider, please refer to [email or web address] ?After hours, contact General Neurology  ?

## 2021-09-28 NOTE — Progress Notes (Signed)
Occupational Therapy Treatment ?Patient Details ?Name: Craig Schmidt ?MRN: [id number] ?DOB: May 19, 1971 ?Today's Date: 09/28/2021 ? ? ?History of present illness 51 y.o. male presented to the emergency room for sudden onset of right-sided weakness. TNKase given. 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 PHMx:schwannoma, cocaine abuse which he says he has not used over the past few weeks. ?  ?OT comments ? Pt making good progress with OT goals. This session focused on educating donning and adjusting of RUE sling, as well as RUE mobility, and safe functional mobility. Pt is able to verbalize how to don sling, however continues to have difficulty physically with donning it due to being non-dominant with his LUE. Continuing to recommend further therapy to maximize pt's independence and safety before returning to the community.   ? ?Recommendations for follow up therapy are one component of a multi-disciplinary discharge planning process, led by the attending physician.  Recommendations may be updated based on patient status, additional functional criteria and insurance authorization. ?   ?Follow Up Recommendations ? Skilled nursing-short term rehab (<3 hours/day)  ?  ?Assistance Recommended at Discharge Frequent or constant Supervision/Assistance  ?Patient can return home with the following ? A little help with walking and/or transfers;A lot of help with bathing/dressing/bathroom;Assistance with cooking/housework;Direct supervision/assist for medications management;Direct supervision/assist for financial management;Assist for transportation;Help with stairs or ramp for entrance ?  ?Equipment Recommendations ? None recommended by OT  ?  ?Recommendations for Other Services   ? ?  ?Precautions / Restrictions Precautions ?Precautions: Fall ?Precaution Comments: R UE  flaccid ?Required Braces or Orthoses: Sling ?Restrictions ?Weight Bearing Restrictions: No  ? ? ?  ? ?Mobility Bed Mobility ?  ?  ?  ?  ?  ?  ?  ?General bed mobility comments: sitting up in recliner on entry ?  ? ?Transfers ?Overall transfer level: Needs assistance ?Equipment used: None ?Transfers: Sit to/from Stand ?Sit to Stand: Supervision ?  ?  ?  ?  ?  ?General transfer comment: cues for safety only ?  ?  ?Balance   ?  ?Sitting balance-Leahy Scale: Good ?  ?  ?  ?Standing balance-Leahy Scale: Fair ?  ?  ?  ?  ?  ?  ?  ?  ?  ?  ?  ?  ?   ? ?ADL either performed or assessed with clinical judgement  ? ?ADL Overall ADL's : Needs assistance/impaired ?Eating/Feeding: Set up;Sitting ?Eating/Feeding Details (indicate cue type and reason): Pt using LUE to self feed ice cream ?Grooming: Wash/dry hands;Wash/dry face;Set up;Standing ?Grooming Details (indicate cue type and reason): Required prompting to incorporate UE ?  ?  ?  ?  ?Upper Body Dressing : Moderate assistance ?Upper Body Dressing Details (indicate cue type and reason): Assisted with donning sling, practiced tightening and loosening for different levels of support ?  ?  ?Toilet Transfer: Supervision/safety;Ambulation ?Toilet Transfer Details (indicate cue type and reason): Sup for safety, no AD ?  ?  ?  ?  ?  ?General ADL Comments: Pt improving with use of LUE, still requiring cuing to incorporate RUE. HAving some difficulty with donning sling himself ?  ? ?Extremity/Trunk Assessment   ?  ?  ?  ?  ?  ? ?Vision   ?  ?  ?Perception   ?  ?Praxis   ?  ? ?Cognition Arousal/Alertness: Awake/alert ?Behavior During Therapy: [facility] for tasks assessed/performed ?  Overall Cognitive Status: Within Functional Limits for tasks assessed ?  ?  ?  ?  ?  ?  ?  ?  ?  ?  ?  ?  ?  ?  ?  ?  ?  ?  ?  ?   ?Exercises Exercises: Other exercises ?Other Exercises ?Other Exercises: Self PROM of RUE ?Other Exercises: AAROM of elbow, wrist, and hand ? ?  ?Shoulder Instructions   ? ? ?  ?General  Comments VSS on RA  ? ? ?Pertinent Vitals/ Pain       Pain Assessment ?Pain Assessment: No/denies pain ? ?Home Living   ?  ?  ?  ?  ?  ?  ?  ?  ?  ?  ?  ?  ?  ?  ?  ?  ?  ?  ? ?  ?Prior Functioning/Environment    ?  ?  ?  ?   ? ?Frequency ? Min 2X/week  ? ? ? ? ?  ?Progress Toward Goals ? ?OT Goals(current goals can now be found in the care plan section) ? Progress towards OT goals: Progressing toward goals ? ?Acute Rehab OT Goals ?Patient Stated Goal: To be more independent ?OT Goal Formulation: With patient ?Time For Goal Achievement: 10/12/21 ?Potential to Achieve Goals: Good ?ADL Goals ?Pt Will Perform Grooming: Independently;standing ?Pt Will Perform Upper Body Bathing: sitting;with set-up ?Pt Will Perform Lower Body Bathing: sit to/from stand;with set-up ?Pt Will Transfer to Toilet: Independently;ambulating ?Pt/caregiver will Perform Home Exercise Program: Increased ROM;Right Upper extremity;With written HEP provided ?Additional ADL Goal #1: Pt will be S in and OOB for basic ADLs  ?Plan Discharge plan remains appropriate   ? ?Co-evaluation ? ? ?   ?  ?  ?  ?  ? ?  ?AM-PAC OT "6 Clicks" Daily Activity     ?Outcome Measure ? ? Help from another person eating meals?: A Little ?Help from another person taking care of personal grooming?: A Little ?Help from another person toileting, which includes using toliet, bedpan, or urinal?: A Little ?Help from another person bathing (including washing, rinsing, drying)?: A Lot ?Help from another person to put on and taking off regular upper body clothing?: A Lot ?Help from another person to put on and taking off regular lower body clothing?: A Lot ?6 Click Score: 15 ? ?  ?End of Session   ? ?OT Visit Diagnosis: Other abnormalities of gait and mobility (R26.89);Muscle weakness (generalized) (M62.81);Other symptoms and signs involving cognitive function;Hemiplegia and hemiparesis;Pain ?Hemiplegia - Right/Left: Right ?Hemiplegia - dominant/non-dominant: Dominant ?Hemiplegia -  caused by: Cerebral infarction ?Pain - Right/Left: Right ?Pain - part of body: Hand;Arm ?  ?Activity Tolerance Patient tolerated treatment well ?  ?Patient Left in chair;with call bell/phone within reach ?  ?Nurse Communication Mobility status ?  ? ?   ? ?Time: 5784-6962 ?OT Time Calculation (min): 46 min ? ?Charges: OT General Charges ?$OT Visit: 1 Visit ?OT Treatments ?$Self Care/Home Management : 8-22 mins ?$Therapeutic Activity: 23-37 mins ? ?Konstantinos Cordoba H., OTR/L ?Acute Rehabilitation ? ?Craig Schmidt Elane Yolanda Bonine ?09/28/2021, 8:01 PM ?

## 2021-09-29 DIAGNOSIS — H60391 Other infective otitis externa, right ear: Secondary | ICD-10-CM

## 2021-09-29 LAB — CBC
HCT: 38.1 % — ABNORMAL LOW (ref 39.0–52.0)
Hemoglobin: 12.5 g/dL — ABNORMAL LOW (ref 13.0–17.0)
MCH: 29 pg (ref 26.0–34.0)
MCHC: 32.8 g/dL (ref 30.0–36.0)
MCV: 88.4 fL (ref 80.0–100.0)
Platelets: 432 10*3/uL — ABNORMAL HIGH (ref 150–400)
RBC: 4.31 MIL/uL (ref 4.22–5.81)
RDW: 14 % (ref 11.5–15.5)
WBC: 7.2 10*3/uL (ref 4.0–10.5)
nRBC: 0 % (ref 0.0–0.2)

## 2021-09-29 LAB — BASIC METABOLIC PANEL
Anion gap: 8 (ref 5–15)
BUN: 31 mg/dL — ABNORMAL HIGH (ref 6–20)
CO2: 27 mmol/L (ref 22–32)
Calcium: 9.3 mg/dL (ref 8.9–10.3)
Chloride: 101 mmol/L (ref 98–111)
Creatinine, Ser: 1.52 mg/dL — ABNORMAL HIGH (ref 0.61–1.24)
GFR, Estimated: 56 mL/min — ABNORMAL LOW (ref >60)
Glucose, Bld: 96 mg/dL (ref 70–99)
Potassium: 4.3 mmol/L (ref 3.5–5.1)
Sodium: 136 mmol/L (ref 135–145)

## 2021-09-29 LAB — PROTIME-INR
INR: 2.2 — ABNORMAL HIGH (ref 0.8–1.2)
Prothrombin Time: 24.5 seconds — ABNORMAL HIGH (ref 11.4–15.2)

## 2021-09-29 MED ORDER — CIPROFLOXACIN-DEXAMETHASONE 0.3-0.1 % OT SUSP
4.0000 [drp] | Freq: Two times a day (BID) | OTIC | Status: DC
  Administered 2021-09-29 – 2021-10-02 (×7): 4 [drp] via OTIC
  Filled 2021-09-29: qty 7.5

## 2021-09-29 MED ORDER — POLYVINYL ALCOHOL 1.4 % OP SOLN
2.0000 [drp] | OPHTHALMIC | Status: DC | PRN
  Administered 2021-09-29: 2 [drp] via OPHTHALMIC
  Filled 2021-09-29: qty 15

## 2021-09-29 NOTE — Progress Notes (Signed)
ANTICOAGULATION CONSULT NOTE - Follow Up Consult ? ?Pharmacy Consult for Warfarin ?Indication:  rheumatic valve stenosis; CVA ? ?Allergies  ?Allergen Reactions  ? Peanut-Containing Drug Products Itching  ? Shellfish Allergy Swelling  ? ? ?Patient Measurements: ?Height: '5\' 5"'$  (165.1 cm) ?Weight: 80 kg (176 lb 5.9 oz) ?IBW/kg (Calculated) : 61.5 ? ?Vital Signs: ?Temp: 97.8 ?F (36.6 ?C) (03/10 1125) ?Temp Source: Oral (03/10 1125) ?BP: 115/82 (03/10 1125) ?Pulse Rate: 93 (03/10 1125) ? ?Labs: ?Recent Labs  ?  09/27/21 ?0258 09/27/21 ?1109 09/27/21 ?1257 09/28/21 ?0109 09/29/21 ?0326  ?HGB 11.4*  --   --  11.9* 12.5*  ?HCT 36.1*  --   --  37.4* 38.1*  ?PLT 470*  --   --  436* 432*  ?LABPROT 27.9*  --   --  28.1* 24.5*  ?INR 2.6*  --   --  2.6* 2.2*  ?CREATININE 1.46*  --   --  1.28* 1.52*  ?TROPONINIHS  --  21* 19*  --   --   ? ? ? ?Estimated Creatinine Clearance: 57.3 mL/min (A) (by C-G formula based on SCr of 1.52 mg/dL (H)). ? 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 Pharmacy consulted to dose warfarin on 2/18. S/p TNK on 2/17 at ~9:30am. TEE negative for thrombus and Cardiology recommends 30-day cardiac even monitor as outpatient to rule out atrial fibrillation.  ? ?Warfarin 5 mg MWF and 7.5 mg TTSS  ? ?INR remains therapeutic. We will reduce INR check to MWF if therapeutic in AM.  ? ?Goal of Therapy:  ?INR 2-3 ?Monitor platelets by anticoagulation protocol: Yes ?  ?Plan:  ?Continue Warfarin 5 mg MWF and 7.5 mg TTSS. 7.5 mg  ?Daily PT/INR ?Change to less frequent monitoring when INR stable. ? ?Onnie Boer, PharmD, BCIDP, AAHIVP, CPP ?Infectious Disease Pharmacist ?09/29/2021 1:07 PM ? ? ? ? ?

## 2021-09-29 NOTE — Plan of Care (Signed)
?  Problem: Education: ?Goal: Knowledge of secondary prevention will improve (SELECT ALL) ?Outcome: Progressing ?Goal: Knowledge of patient specific risk factors will improve (INDIVIDUALIZE FOR PATIENT) ?Outcome: Progressing ?Goal: Individualized Educational Video(s) ?Outcome: Progressing ?  ?Problem: Health Behavior/Discharge Planning: ?Goal: Ability to manage health-related needs will improve ?Outcome: Progressing ?  ?Problem: Self-Care: ?Goal: Ability to participate in self-care as condition permits will improve ?Outcome: Progressing ?  ?Problem: Ischemic Stroke/TIA Tissue Perfusion: ?Goal: Complications of ischemic stroke/TIA will be minimized ?Outcome: Progressing ?  ?Problem: Education: ?Goal: Knowledge of General Education information will improve ?Description: Including pain rating scale, medication(s)/side effects and non-pharmacologic comfort measures ?Outcome: Progressing ?  ?Problem: Health Behavior/Discharge Planning: ?Goal: Ability to manage health-related needs will improve ?Outcome: Progressing ?  ?Problem: Clinical Measurements: ?Goal: Ability to maintain clinical measurements within normal limits will improve ?Outcome: Progressing ?Goal: Will remain free from infection ?Outcome: Progressing ?  ?

## 2021-09-29 NOTE — Progress Notes (Signed)
STROKE TEAM PROGRESS NOTE  ? ?INTERVAL HISTORY ?RN is at the bedside. Pt sitting at the edge of bed, still has right UE weakness. BP improved today 110-120s, will resume the CHF meds. Pt also complaining of right ear pain, otoscope showed otitis externa, will start ciprodex ear drop. Discussed with pt and her sister about discharge plan, will likely be d/c next week.  ? ? ?Vitals:  ? 09/28/21 2326 09/29/21 0342 09/29/21 0800 09/29/21 1125  ?BP: 96/66 123/86 (!) 126/93 115/82  ?Pulse: 96 88 90 93  ?Resp: '16 16 20 14  '$ ?Temp: (!) 97.3 ?F (36.3 ?C) 97.8 ?F (36.6 ?C) 97.6 ?F (36.4 ?C) 97.8 ?F (36.6 ?C)  ?TempSrc: Oral Oral Oral Oral  ?SpO2: 99% 100% 100% 100%  ?Weight:      ?Height:      ? ?CBC:  ?Recent Labs  ?Lab 09/28/21 ?0109 09/29/21 ?0326  ?WBC 8.5 7.2  ?HGB 11.9* 12.5*  ?HCT 37.4* 38.1*  ?MCV 88.2 88.4  ?PLT 436* 432*  ? ?Basic Metabolic Panel:  ?Recent Labs  ?Lab 09/28/21 ?0109 09/29/21 ?0326  ?NA 137 136  ?K 4.7 4.3  ?CL 104 101  ?CO2 23 27  ?GLUCOSE 111* 96  ?BUN 34* 31*  ?CREATININE 1.28* 1.52*  ?CALCIUM 9.3 9.3  ? ?Lipid Panel:  ?No results for input(s): CHOL, TRIG, HDL, CHOLHDL, VLDL, LDLCALC in the last 168 hours. ? ?HgbA1c:  ?No results for input(s): HGBA1C in the last 168 hours. ? ?Urine Drug Screen:  ?No results for input(s): LABOPIA, COCAINSCRNUR, LABBENZ, AMPHETMU, THCU, LABBARB in the last 168 hours.  ?Alcohol Level  ?No results for input(s): ETH in the last 168 hours. ? ?IMAGING past 24 hours ?No results found. ? ?PHYSICAL EXAM ?General:  Alert, awake.  ? ? ?NEURO:  ?Mental Status: AA&Ox3  ?Speech/Language: speech is without dysarthria or aphasia.   ? ?Cranial Nerves:  ?II: PERRL. Visual fields full.  ?III, IV, VI: EOMI. Eyelids elevate symmetrically.  ?V: normal sensation on left, mildly decreased on right. ?VII: mild right facial droop  ?VIII: hearing intact to voice. ?IX, X:  Phonation is normal.  ?XII: tongue is midline without fasciculations. ?Motor: 5/5 strength to LUE and LLE, 3/5 strength to  RUE and 4/5 strength to RLE ?Tone: is normal and bulk is normal ?Sensation- right sided decreased sensation present ?Coordination: FTN intact on left ?Gait- deferred ? ?Right foot swelling noted but mild. No evidence of infection. ? ?ASSESSMENT/PLAN ?Mr. LAKEITH CAREAGA is a 51 y.o. male with history of schwannoma, cocaine use and smoking presenting with sudden onset right sided weakness and slurred speech.  He was given TNK in the ED.  No LVO was seen on CTA head, so thrombectomy could not be performed.  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  Coumadin was started, and TEE completed.  ? 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 Generalized volume loss and left cerebellopontine angle vestibular schwannoma ASPECTS 10.    ?CTA head & neck negative for LVO ?MRI  patchy multifocal acute ischemic infarcts in left greater than right cerebral hemispheres and cerebellum, chronic microvascular ischemic disease, 1.4 cm left vestibular schwannoma ?2D Echo EF 25-30%, global left ventricular hypokinesis, grade 2 diastolic dysfunction, rheumatic mitral valve with moderate mitral stenosis, moderate to severe tricuspid regurgitation, dilated right and left atria, no atrial level shunt ?TEE showed no LV thrombus,  LAA spontaneous contrast, mild MR and severe MS, questionable rheumatic etiology ?Cardiology recommend 30 day monitoring to rule out afib as outpt ?LDL 77 ?HgbA1c 5.6 ?VTE prophylaxis - lovenox ?No antithrombotic prior to admission, now on warfarin daily and INR therapeutic. ?Therapy recommendations:   pending ?Disposition:  pending ? ?CP and SOB in the setting of hypotension ?Episode occurred 3/8 with low BP ?EKG no change from prior ?Troponin level 21 ?Hold off CHF/BP meds if BP < 110 ?Reconsulted cardiology for medication adjustment. ? ?Hypertension ?Home  meds:  none ?Stable ?BiDil and Coreg started by cardiology ?Also on entrestro ?Hold meds if SBP < 110 ?Long-term BP goal normotensive ? ?Hyperlipidemia ?Home meds:  none ?LDL 77, goal < 70 ?Continue atorvastatin 40 mg daily  ?Continue statin at discharge ? 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 Harl Bowie on 10/10/21 ?Per card, if 30 day cardiac event monitoring found afib, he will need MAZE and LAA resection too.  ? 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 ? ?Tobacco abuse ?Current smoker ?Smoking cessation counseling provided ?Nicotine patch provided, dose decreased to 14 mg 3/6, decrease again on 3/20 and discontinue on 4/3 ?Pt is willing to quit ? ?Cocaine abuse ?Cocaine cessation education provided ?Patient is willing to quit ?Okay with Coreg per cardiology ? ?Alcohol abuse ?On FA/B1/MVI ?Watch for alcohol withdraw symptoms ? ?Otitis externa, right ?Pt complaining of right ear pain ?under otoscope, pt had right ear otitis externa ?Ordered Ciprodex 4 drops bid ? ?Other Stroke Risk Factors ? ? ?Other Active Problems ? b/l feet/leg pain-controlled. ?con't gabapentin 300 BID. ?Melatonin '3mg'$  -> '5mg'$   ?U/S neg for DVT. ? ?Hospital day # 21 ? ? ?[name], MD PhD ?Stroke Neurology ?09/29/2021 ?2:28 PM ? ? ? ?To contact Stroke Continuity provider, please refer to [email or web address] ?After hours, contact General Neurology  ?

## 2021-09-29 NOTE — TOC Progression Note (Signed)
Transition of Care (TOC) - Progression Note  ? ? ?Patient Details  ?Name: Craig Schmidt ?MRN: [id number] ?Date of Birth: [birth year] ? ?Transition of Care (TOC) CM/SW Contact  ?Geralynn Ochs, LCSW ?Phone Number: ?09/29/2021, 2:25 PM ? ?Clinical Narrative:   CSW met with patient with MD to discuss discharge back to the street when medically stable, as there are no other options for the patient. CSW answered patient's questions, as he believed he would stay at the hospital until his Medicaid was approved, but CSW told him that could take months and he is medically stable for discharge. Patient frustrated that he has nowhere to go, but he has refused shelter placement, and CSW has no other disposition to offer patient. Patient asking for CSW to speak with his sister so she is aware. ? ?CSW talked to sister via phone to discuss discharge to the street, as there are no other options. Sister very upset on the phone, saying that she was told that the patient wouldn't be discharged until his Medicaid was approved. CSW explained to sister that would not be the case, and he would be discharged when medically stable, which would be soon. Sister asking for pharmacy consult as she was told some of his medications were messing with him, but she could not name the medications when asked. CSW relayed message to MD. Sister disagreeing with plan to discharge to the street and asking to speak with someone above the MD, not wanting to speak with CSW again. CSW to follow. ? ? ? ?Expected Discharge Plan: Home/Self Care ?Barriers to Discharge: Inadequate or no insurance ? ?Expected Discharge Plan and Services ?Expected Discharge Plan: Home/Self Care ?  ?  ?Post Acute Care Choice: [place] ?Living arrangements for the past 2 months: Homeless ?                ?  ?  ?  ?  ?  ?  ?  ?  ?  ?  ? ? ?Social Determinants of Health (SDOH) Interventions ?Food Insecurity Interventions: Intervention Not Indicated ?Financial Strain  Interventions: Other (Comment) (HF CSW/RNCM consulted) ?Housing Interventions: Other (Comment) (HF CSW consulted) ?Transportation Interventions: Other (Comment) (HF CSW consulted) ?Alcohol Brief Interventions/Follow-up: Alcohol education/Brief advice ? ?Readmission Risk Interventions ?No flowsheet data found. ? ?

## 2021-09-30 LAB — CBC
HCT: 39 % (ref 39.0–52.0)
Hemoglobin: 12.5 g/dL — ABNORMAL LOW (ref 13.0–17.0)
MCH: 28.1 pg (ref 26.0–34.0)
MCHC: 32.1 g/dL (ref 30.0–36.0)
MCV: 87.6 fL (ref 80.0–100.0)
Platelets: 474 10*3/uL — ABNORMAL HIGH (ref 150–400)
RBC: 4.45 MIL/uL (ref 4.22–5.81)
RDW: 14.2 % (ref 11.5–15.5)
WBC: 7.4 10*3/uL (ref 4.0–10.5)
nRBC: 0 % (ref 0.0–0.2)

## 2021-09-30 LAB — BASIC METABOLIC PANEL
Anion gap: 8 (ref 5–15)
BUN: 34 mg/dL — ABNORMAL HIGH (ref 6–20)
CO2: 24 mmol/L (ref 22–32)
Calcium: 9.4 mg/dL (ref 8.9–10.3)
Chloride: 103 mmol/L (ref 98–111)
Creatinine, Ser: 1.47 mg/dL — ABNORMAL HIGH (ref 0.61–1.24)
GFR, Estimated: 58 mL/min — ABNORMAL LOW (ref >60)
Glucose, Bld: 105 mg/dL — ABNORMAL HIGH (ref 70–99)
Potassium: 4.7 mmol/L (ref 3.5–5.1)
Sodium: 135 mmol/L (ref 135–145)

## 2021-09-30 LAB — PROTIME-INR
INR: 2.3 — ABNORMAL HIGH (ref 0.8–1.2)
Prothrombin Time: 25.3 seconds — ABNORMAL HIGH (ref 11.4–15.2)

## 2021-09-30 MED ORDER — ACETAMINOPHEN 500 MG PO TABS
500.0000 mg | ORAL_TABLET | Freq: Four times a day (QID) | ORAL | Status: DC | PRN
  Administered 2021-10-02 – 2021-10-03 (×2): 500 mg via ORAL
  Filled 2021-09-30 (×2): qty 1

## 2021-09-30 NOTE — Progress Notes (Signed)
ANTICOAGULATION CONSULT NOTE - Follow Up Consult ? ?Pharmacy Consult for Warfarin ?Indication:  rheumatic valve stenosis; CVA ? ?Allergies  ?Allergen Reactions  ? Peanut-Containing Drug Products Itching  ? Shellfish Allergy Swelling  ? ? ?Patient Measurements: ?Height: '5\' 5"'$  (165.1 cm) ?Weight: 80 kg (176 lb 5.9 oz) ?IBW/kg (Calculated) : 61.5 ? ?Vital Signs: ?Temp: 97.9 ?F (36.6 ?C) (03/11 0356) ?Temp Source: Oral (03/11 0356) ?BP: 93/62 (03/11 0356) ?Pulse Rate: 100 (03/11 0356) ? ?Labs: ?Recent Labs  ?  09/27/21 ?1109 09/27/21 ?1257 09/28/21 ?0109 09/28/21 ?0109 09/29/21 ?0326 09/30/21 ?0128  ?HGB  --   --  11.9*   < > 12.5* 12.5*  ?HCT  --   --  37.4*  --  38.1* 39.0  ?PLT  --   --  436*  --  432* 474*  ?LABPROT  --   --  28.1*  --  24.5* 25.3*  ?INR  --   --  2.6*  --  2.2* 2.3*  ?CREATININE  --   --  1.28*  --  1.52* 1.47*  ?TROPONINIHS 21* 19*  --   --   --   --   ? < > = values in this interval not displayed.  ? ? ? ?Estimated Creatinine Clearance: 59.2 mL/min (A) (by C-G formula based on SCr of 1.47 mg/dL (H)). ? 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 Pharmacy consulted to dose warfarin on 2/18. S/p TNK on 2/17 at ~9:30am. TEE negative for thrombus and Cardiology recommends 30-day cardiac event monitor as outpatient to rule out atrial fibrillation.  ? ?Warfarin 5 mg MWF and 7.5 mg TTSS  ? ?INR remains therapeutic. We will reduce INR check to MWF if therapeutic in AM.  ? ?Goal of Therapy:  ?INR 2-3 ?Monitor platelets by anticoagulation protocol: Yes ?  ?Plan:  ?Continue Warfarin 5 mg MWF and 7.5 mg TTSS. 7.5 mg  ?Daily PT/INR ?Change to less frequent monitoring when INR stable. ? ?Cephus Slater, PharmD, MBA ?Pharmacy Resident ?([phone] ?09/30/2021 7:50 AM ? ? ? ? ? ?

## 2021-09-30 NOTE — Progress Notes (Addendum)
STROKE TEAM PROGRESS NOTE  ? ?INTERVAL HISTORY ?He is standing up and washing his face at the sink in his room.  He still complains of pain on the right side.  I discussed with him that I reviewed his scans and he has had a vessel scan as well as MRI that is negative there is no need to worry about aneurysm.  Started on ciprodex ear drop on Friday which is not helping and may be causing some irritation.  He is otherwise working with physical therapy able to get around his room without assistance and has a healthy appetite. ? ?Vitals:  ? 09/29/21 2029 09/29/21 2339 09/30/21 0356 09/30/21 0806  ?BP: 1'14/65 95/64 93/62 '$ 102/72  ?Pulse: (!) 108 100 100 86  ?Resp: '17 16 16   '$ ?Temp: 97.6 ?F (36.4 ?C) 97.7 ?F (36.5 ?C) 97.9 ?F (36.6 ?C) 97.7 ?F (36.5 ?C)  ?TempSrc: Oral Oral Oral Oral  ?SpO2: 100% 100% 100% 90%  ?Weight:      ?Height:      ? ?CBC:  ?Recent Labs  ?Lab 09/29/21 ?0326 09/30/21 ?0128  ?WBC 7.2 7.4  ?HGB 12.5* 12.5*  ?HCT 38.1* 39.0  ?MCV 88.4 87.6  ?PLT 432* 474*  ? ? ?Basic Metabolic Panel:  ?Recent Labs  ?Lab 09/29/21 ?0326 09/30/21 ?0128  ?NA 136 135  ?K 4.3 4.7  ?CL 101 103  ?CO2 27 24  ?GLUCOSE 96 105*  ?BUN 31* 34*  ?CREATININE 1.52* 1.47*  ?CALCIUM 9.3 9.4  ? ? ?Lipid Panel:  ?No results for input(s): CHOL, TRIG, HDL, CHOLHDL, VLDL, LDLCALC in the last 168 hours. ? ?HgbA1c:  ?No results for input(s): HGBA1C in the last 168 hours. ? ?Urine Drug Screen:  ?No results for input(s): LABOPIA, COCAINSCRNUR, LABBENZ, AMPHETMU, THCU, LABBARB in the last 168 hours.  ?Alcohol Level  ?No results for input(s): ETH in the last 168 hours. ? ?IMAGING past 24 hours ?No results found. ? ?PHYSICAL EXAM ?General:  Alert, awake.  ?He is in no acute distress.  He looks happy.  He is able to walk around the room without assistance. ? ?NEURO:  ?Mental Status: AA&Ox3  ?Speech/Language: speech is without dysarthria or aphasia.   ? ?Cranial Nerves:  ?II: PERRL. Visual fields full.  ?III, IV, VI: EOMI. Eyelids elevate  symmetrically.  ?V: normal sensation on left, mildly decreased on right. ?VII: mild right facial droop  ?VIII: hearing intact to voice. ?IX, X:  Phonation is normal.  ?XII: tongue is midline without fasciculations. ?Motor: 5/5 strength to LUE and LLE, 3/5 strength to RUE can move laterally and 4+/5 strength to RLE ?Tone: is normal and bulk is normal ?Sensation- right sided decreased sensation present ?Coordination: FTN intact on left ?Gait- deferred ? ?Right foot swelling noted but mild. No evidence of infection. ? ?ASSESSMENT/PLAN ?[name] is a 51 y.o. male with history of schwannoma, cocaine use and smoking presenting with sudden onset right sided weakness and slurred speech.  He was given TNK in the ED.  No LVO was seen on CTA head, so thrombectomy could not be performed.  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  Coumadin was started, and TEE completed. Dr. Erlinda Hong discussed discharging on Monday, started right ear ggt for pain and now with right temporal headache.  Discussed that he may need to stop the drops.  I have reviewed his imaging and there is no need for further imaging at this point given his  stable exam. He has prn tylenol that is helping manage his pain. ? 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 Generalized volume loss and left cerebellopontine angle vestibular schwannoma ASPECTS 10.    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 Pharmacy following. ?Therapy recommendations:   pending ?Disposition:  pending ? ?CP and SOB in the setting of hypotension ?Episode occurred 3/8 with low BP ?EKG no change from prior ?Troponin level 21 ?Hold off CHF/BP meds if BP < 110 ?Reconsulted cardiology for medication adjustment. ? ?Hypertension ?Home meds:  none ?Stable ?BiDil and Coreg started by cardiology ?Also on entrestro ?Hold meds if SBP < 110 ?Long-term BP goal normotensive ? ?Hyperlipidemia ?Home meds:  none ?LDL 77, goal < 70 ?Continue atorvastatin 40 mg daily  ?Continue statin at discharge ? 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 Harl Bowie on 10/10/21 ?Per card, if 30 day cardiac event monitoring found afib, he will need MAZE and LAA resection too.  ? 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 ? ?Tobacco abuse ?Current smoker ?Smoking cessation counseling provided ?Nicotine patch provided, dose decreased to 14 mg 3/6, decrease again on 3/20 and discontinue on 4/3 ?Pt is willing to quit ? ?Cocaine abuse ?Cocaine cessation education provided ?Patient is willing to quit ?Okay with Coreg per cardiology ? ?Alcohol abuse ?On FA/B1/MVI ?Watch for  alcohol withdraw symptoms ? ?Otitis externa, right ?Pt complaining of right ear pain ?under otoscope, pt had right ear otitis externa ?Ordered Ciprodex 4 drops bid ? ?Right temporal headache: ?Started after right ear pain and ggt started. Exam is stable. Pt asking for a scan. No need for imaging today. Tylenol prn will be added.  ? ?Other Active Problems ? b/l feet/leg pain-controlled. ?Stop gabapentin 300 BID. ?Stop melatonin ?U/S neg for DVT. ? ?Hospital day # 22 ? ? ? ?To contact Stroke Continuity provider, please refer to [email or web address] ?After hours, contact General Neurology  ?

## 2021-10-01 LAB — CBC
HCT: 37.2 % — ABNORMAL LOW (ref 39.0–52.0)
Hemoglobin: 11.8 g/dL — ABNORMAL LOW (ref 13.0–17.0)
MCH: 28 pg (ref 26.0–34.0)
MCHC: 31.7 g/dL (ref 30.0–36.0)
MCV: 88.2 fL (ref 80.0–100.0)
Platelets: 402 10*3/uL — ABNORMAL HIGH (ref 150–400)
RBC: 4.22 MIL/uL (ref 4.22–5.81)
RDW: 14.1 % (ref 11.5–15.5)
WBC: 7.6 10*3/uL (ref 4.0–10.5)
nRBC: 0 % (ref 0.0–0.2)

## 2021-10-01 NOTE — Plan of Care (Signed)
?  Problem: Self-Care: ?Goal: Ability to participate in self-care as condition permits will improve ?Outcome: Progressing ?  ?Problem: Education: ?Goal: Knowledge of General Education information will improve ?Description: Including pain rating scale, medication(s)/side effects and non-pharmacologic comfort measures ?Outcome: Progressing ?  ?

## 2021-10-02 DIAGNOSIS — I255 Ischemic cardiomyopathy: Secondary | ICD-10-CM

## 2021-10-02 DIAGNOSIS — H9201 Otalgia, right ear: Secondary | ICD-10-CM

## 2021-10-02 DIAGNOSIS — F17200 Nicotine dependence, unspecified, uncomplicated: Secondary | ICD-10-CM

## 2021-10-02 DIAGNOSIS — F1410 Cocaine abuse, uncomplicated: Secondary | ICD-10-CM

## 2021-10-02 DIAGNOSIS — F1010 Alcohol abuse, uncomplicated: Secondary | ICD-10-CM

## 2021-10-02 DIAGNOSIS — I63412 Cerebral infarction due to embolism of left middle cerebral artery: Secondary | ICD-10-CM

## 2021-10-02 DIAGNOSIS — M26609 Unspecified temporomandibular joint disorder, unspecified side: Secondary | ICD-10-CM

## 2021-10-02 LAB — PROTIME-INR
INR: 2.8 — ABNORMAL HIGH (ref 0.8–1.2)
Prothrombin Time: 29.5 seconds — ABNORMAL HIGH (ref 11.4–15.2)

## 2021-10-02 MED ORDER — ACETIC ACID 2 % OT SOLN: 4.0000 [drp] | OTIC | Status: DC | PRN

## 2021-10-02 MED ORDER — ACETIC ACID 2 % OT SOLN
4.0000 [drp] | Freq: Four times a day (QID) | OTIC | Status: DC | PRN
  Filled 2021-10-02: qty 15

## 2021-10-02 MED ORDER — CALAMINE EX LOTN
1.0000 "application " | TOPICAL_LOTION | CUTANEOUS | Status: DC | PRN
  Administered 2021-10-02: 1 via TOPICAL
  Filled 2021-10-02: qty 177

## 2021-10-02 NOTE — Progress Notes (Addendum)
STROKE TEAM PROGRESS NOTE  ? ?INTERVAL HISTORY ?RN is at the bedside. Still complaining of right ear pain and ear drop does not work but irritating. Had ENT Dr. Janace Hoard came by to see him, concerning for TMJ dysfunction. Recommend hear, motrin, and soft diet. Pt also complaining of bifrontal HA, will repeat CT head.  ? ? ?Vitals:  ? 10/02/21 0320 10/02/21 0812 10/02/21 1153 10/02/21 1525  ?BP: 106/70 117/75 101/63 127/84  ?Pulse: (!) 105 87 98 92  ?Resp: '16 14 16 18  '$ ?Temp: 98.2 ?F (36.8 ?C) 98.1 ?F (36.7 ?C) 97.7 ?F (36.5 ?C) 98.1 ?F (36.7 ?C)  ?TempSrc: Oral Oral Oral Oral  ?SpO2: 98% 99% 99% 100%  ?Weight:      ?Height:      ? ?CBC:  ?Recent Labs  ?Lab 09/30/21 ?0128 10/01/21 ?0023  ?WBC 7.4 7.6  ?HGB 12.5* 11.8*  ?HCT 39.0 37.2*  ?MCV 87.6 88.2  ?PLT 474* 402*  ? ?Basic Metabolic Panel:  ?Recent Labs  ?Lab 09/29/21 ?0326 09/30/21 ?0128  ?NA 136 135  ?K 4.3 4.7  ?CL 101 103  ?CO2 27 24  ?GLUCOSE 96 105*  ?BUN 31* 34*  ?CREATININE 1.52* 1.47*  ?CALCIUM 9.3 9.4  ? ?Lipid Panel:  ?No results for input(s): CHOL, TRIG, HDL, CHOLHDL, VLDL, LDLCALC in the last 168 hours. ? ?HgbA1c:  ?No results for input(s): HGBA1C in the last 168 hours. ? ?Urine Drug Screen:  ?No results for input(s): LABOPIA, COCAINSCRNUR, LABBENZ, AMPHETMU, THCU, LABBARB in the last 168 hours.  ?Alcohol Level  ?No results for input(s): ETH in the last 168 hours. ? ?IMAGING past 24 hours ?No results found. ? ?PHYSICAL EXAM ?General:  Alert, awake.  ? ? ?NEURO:  ?Mental Status: AA&Ox3  ?Speech/Language: speech is without dysarthria or aphasia.   ? ?Cranial Nerves:  ?II: PERRL. Visual fields full.  ?III, IV, VI: EOMI. Eyelids elevate symmetrically.  ?V: normal sensation on left, mildly decreased on right. ?VII: mild right facial droop  ?VIII: hearing intact to voice. ?IX, X:  Phonation is normal.  ?XII: tongue is midline without fasciculations. ?Motor: 5/5 strength to LUE and LLE, 3/5 strength to RUE and 4/5 strength to RLE ?Tone: is normal and bulk is  normal ?Sensation- right sided decreased sensation present ?Coordination: FTN intact on left ?Gait- deferred ? ?Right foot swelling noted but mild. No evidence of infection. ? ?ASSESSMENT/PLAN ?Mr. KAIRON SHOCK is a 51 y.o. male with history of schwannoma, cocaine use and smoking presenting with sudden onset right sided weakness and slurred speech.  He was given TNK in the ED.  No LVO was seen on CTA head, so thrombectomy could not be performed.  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  Coumadin was started, and TEE completed.  ? 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 Generalized volume loss and left cerebellopontine angle vestibular schwannoma ASPECTS 10.    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 ?Therapy recommendations:   pending ?Disposition:  pending ? ?CP and SOB in the setting of hypotension ?Episode occurred 3/8 with low BP ?EKG no change from prior ?Troponin level 21 ?Hold off CHF/BP meds if BP < 110 ?Reconsulted cardiology for medication adjustment. ? ?Hypertension ?Home meds:  none ?Stable ?BiDil  and Coreg started by cardiology ?Also on entrestro ?Hold meds if SBP < 110 ?Long-term BP goal normotensive ? ?Hyperlipidemia ?Home meds:  none ?LDL 77, goal < 70 ?Continue atorvastatin 40 mg daily  ?Continue statin at discharge ? 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 Harl Bowie on 10/10/21 ?Per card, if 30 day cardiac event monitoring found afib, he will need MAZE and LAA resection too.  ? 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 ? ?Tobacco abuse ?Current smoker ?Smoking cessation counseling provided ?Nicotine patch provided, dose decreased to 14 mg 3/6, decrease again on 3/20 and discontinue on 4/3 ?Pt is willing to quit ? ?Cocaine abuse ?Cocaine cessation education provided ?Patient is willing to quit ?Okay with Coreg per cardiology ? ?Alcohol abuse ?On FA/B1/MVI ?Watch for alcohol withdraw symptoms ? ?TMJ dysfunction with right ear pain ?Pt complaining of right ear pain ?Was on Ciprodex 4 drops bid -> discontinued ?On acetate acid eardrop ?Dr. Janace Hoard ENT on board ?Heat, Motrin and salt diet ? ?Other Stroke Risk Factors ? ? ?Other Active Problems ? b/l feet/leg pain-controlled. ?con't gabapentin 300 BID. ?Melatonin '3mg'$  -> '5mg'$   ?U/S neg for DVT. 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 ?Hospital day # 24 ? ?I had long discussion with sister Alease Frame over the phone, updated pt current condition, treatment plan and potential prognosis, and answered all the questions.  She  expressed understanding and appreciation.  ? ? ?[name], MD PhD ?Stroke Neurology ?10/02/2021 ?4:25 PM ? ? ? ?To contact Stroke Continuity provider, please refer to [email or web address] ?After hours, contact General Neurology  ?

## 2021-10-02 NOTE — Consult Note (Signed)
Reason for Consult:right ear pain ?Referring Physician: hospital ? ?Craig Schmidt is an 51 y.o. male.  ?HPI: With a 2-day history of right ear pain.  He has not had this previously.  He says there has been some drainage from the ear.  He has been prescribed Ciprodex drops.  The pain seems to be located in the preauricular area and up into his temporal region.  He denies any dental pain.  No dysphagia or odynophagia.  He has not had any sores in his mouth that he is aware of.  His hearing is completely the same or normal to him. ? ?Past Medical History:  ?Diagnosis Date  ? Asthma   ? Brain tumor ([place])   ? ? ?Past Surgical History:  ?Procedure Laterality Date  ? BUBBLE STUDY  09/11/2021  ? Procedure: BUBBLE STUDY;  Surgeon: Fay Records, MD;  Location: [facility];  Service: Cardiovascular;;  ? TEE WITHOUT CARDIOVERSION N/A 09/11/2021  ? Procedure: TRANSESOPHAGEAL ECHOCARDIOGRAM (TEE);  Surgeon: Fay Records, MD;  Location: [facility];  Service: Cardiovascular;  Laterality: N/A;  ? ? ?History reviewed. No pertinent family history. ? ?Social History:  reports that he has been smoking cigarettes. He has a 41.00 pack-year smoking history. He has been exposed to tobacco smoke. He has never used smokeless tobacco. He reports current alcohol use of about 63.0 standard drinks per week. He reports current drug use. Drug: "Crack" cocaine. ? ?Allergies:  ?Allergies  ?Allergen Reactions  ? Peanut-Containing Drug Products Itching  ? Shellfish Allergy Swelling  ? ? ?Medications: I have reviewed the patient's current medications. ? ?Results for orders placed or performed during the hospital encounter of 09/08/21 (from the past 48 hour(s))  ?CBC     Status: Abnormal  ? Collection Time: 10/01/21 12:23 AM  ?Result Value Ref Range  ? WBC 7.6 4.0 - 10.5 K/uL  ? RBC 4.22 4.22 - 5.81 MIL/uL  ? Hemoglobin 11.8 (L) 13.0 - 17.0 g/dL  ? HCT 37.2 (L) 39.0 - 52.0 %  ? MCV 88.2 80.0 - 100.0 fL  ? MCH 28.0 26.0 - 34.0 pg  ? MCHC 31.7 30.0  - 36.0 g/dL  ? RDW 14.1 11.5 - 15.5 %  ? Platelets 402 (H) 150 - 400 K/uL  ? nRBC 0.0 0.0 - 0.2 %  ?  Comment: Performed at [facility], [place] [address], [place], [place] [address]  ?Protime-INR     Status: Abnormal  ? Collection Time: 10/02/21  8:07 AM  ?Result Value Ref Range  ? Prothrombin Time 29.5 (H) 11.4 - 15.2 seconds  ? INR 2.8 (H) 0.8 - 1.2  ?  Comment: (NOTE) ?INR goal varies based on device and disease states. ?Performed at [facility], [place] [address] [place], [place] ?[address] ?  ? ? ?No results found. ? ?ROS ?Blood pressure 117/75, pulse 87, temperature 98.1 ?F (36.7 ?C), temperature source Oral, resp. rate 14, height '5\' 5"'$  (1.651 m), weight 80 kg, SpO2 99 %. ?Physical Exam ?HENT:  ?   Head: Normocephalic.  ?   Right Ear: Tympanic membrane and ear canal normal.  ?   Left Ear: Tympanic membrane, ear canal and external ear normal.  ?   Ears:  ?   Comments: He is tender to palpation in the tragus TMJ region.  There does not appear to be any sores or lesions around the external ear or concha.  The pinna does not hurt by movement.  The rest of  the canal and tympanic membrane look normal.  There is no source of any drainage. ?   Nose: Nose normal.  ?   Mouth/Throat:  ?   Mouth: Mucous membranes are moist.  ?   Pharynx: Oropharynx is clear.  ?Eyes:  ?   Extraocular Movements: Extraocular movements intact.  ?   Pupils: Pupils are equal, round, and reactive to light.  ?Musculoskeletal:  ?   Cervical back: Normal range of motion.  ?Neurological:  ?   Mental Status: He is alert.  ? ? ? ? ?Assessment/Plan: ?Right ear pain-its not clear what the exact etiology but he seems to hurt in his right temporomandibular joint.  There does not appear to be any canal or tympanic membrane issues that would explain this pain.  He says drops are soothing so it would be reasonable to continue a acetic acid solution drop like VoSol.  The Ciprodex is not necessary at this point.  He could use the VoSol as needed or  as much as he wants.  Work-up for the ear pain would be more extensive if it continues after about 2 weeks more for a referred otalgia issue.  He should also be referred as an outpatient for work-up of the left vestibular schwannoma that is 1.4 cm.  The TMJ problem is typically a dental issue but the baseline treatment is heat, Motrin, and soft diet ? ?Melissa Montane ?10/02/2021, 11:33 AM  ? ? ? ?

## 2021-10-02 NOTE — Progress Notes (Signed)
OT Cancellation Note ? ?Patient Details ?Name: Craig Schmidt ?MRN: [id number] ?DOB: 05/26/1971 ? ? ?Cancelled Treatment:    Reason Eval/Treat Not Completed: Patient declined, no reason specified (Patient asked to skip OT on this date. Patient stated he had some personal issues he needed to take care of.) ? ?Lodema Hong, OTA ?Acute Rehabilitation Services  ?Pager [phone] ?Office [phone] ? ?[place] ?10/02/2021, 3:17 PM ?

## 2021-10-02 NOTE — Progress Notes (Signed)
Physical Therapy Treatment ?Patient Details ?Name: Craig Schmidt ?MRN: [id number] ?DOB: 26-Nov-1970 ?Today's Date: 10/02/2021 ? ? ?History of Present Illness 51 y.o. male presented to the emergency room for sudden onset of right-sided weakness. TNKase given. 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 PHMx:schwannoma, cocaine abuse which he says he has not used over the past few weeks. ? ?  ?PT Comments  ? ? Pt bothered by his ear pain and mild balance issues per pt, but still mobilizing at a supervision to mod I in home like environment.  Emphasis on safety overall, progression of gait in home-like environment  (pt has no shoes to go outdoors to practice uneven surfaces) and getting his GivMohr sling adjusted. ?   ?Recommendations for follow up therapy are one component of a multi-disciplinary discharge planning process, led by the attending physician.  Recommendations may be updated based on patient status, additional functional criteria and insurance authorization. ? ?Follow Up Recommendations ?   ?  ?  ?Assistance Recommended at Discharge Set up Supervision/Assistance  ?Patient can return home with the following Assistance with cooking/housework;Assist for transportation;Help with stairs or ramp for entrance;A little help with walking and/or transfers ?  ?Equipment Recommendations ?  (can if he needs one)  ?  ?Recommendations for Other Services   ? ? ?  ?Precautions / Restrictions Precautions ?Precautions: Fall ?Precaution Comments: R UE flaccid ?Required Braces or Orthoses: Sling  ?  ? ?Mobility ? Bed Mobility ?Overal bed mobility: Modified Independent ?  ?  ?  ?  ?  ?  ?  ?  ? ?Transfers ?Overall transfer level: Needs assistance ?Equipment used: None, Straight cane ?Transfers: Sit to/from Stand ?Sit to Stand: Supervision, Modified independent (Device/Increase  time) ?Stand pivot transfers: Supervision, Modified independent (Device/Increase time) ?  ?  ?  ?  ?General transfer comment: cues for safety only ?  ? ?Ambulation/Gait ?Ambulation/Gait assistance: Supervision ?Gait Distance (Feet): 100 Feet (x2) ?Assistive device: None, Straight cane ?Gait Pattern/deviations: Step-through pattern ?  ?  ?  ?General Gait Details: safe use of the SPC in the L hand to add safety for mildly paretic, low clearance of R LE swing through and for uneven surfaces ? ? ?Stairs ?Stairs: Yes ?Stairs assistance: Min assist, Min guard ?Stair Management: With cane, One rail Left ?Number of Stairs: 5 ?General stair comments: cues for safety, pt guarded overall.  He did not feel confident descending without the rail ? ? ?Wheelchair Mobility ?  ? ?Modified Rankin (Stroke Patients Only) ?Modified Rankin (Stroke Patients Only) ?Modified Rankin: Moderate disability ? ? ?  ?Balance Overall balance assessment: Needs assistance ?  ?Sitting balance-Leahy Scale: Good ?  ?  ?  ?Standing balance-Leahy Scale: Fair ?  ?  ?  ?  ?  ?  ?  ?  ?  ?  ?  ?  ?  ? ?  ?Cognition Arousal/Alertness: Awake/alert ?Behavior During Therapy: [facility] for tasks assessed/performed ?Overall Cognitive Status: Within Functional Limits for tasks assessed ?  ?  ?  ?  ?  ?  ?  ?  ?  ?  ?  ?  ?  ?  ?  ?  ?  ?  ?  ? ?  ?Exercises   ? ?  ?General Comments General comments (skin integrity, edema, etc.): Spent a good bit of time readjusting the GivMohr sling.  Someone keeps changing the  settings making it more uncomfortable. ?  ?  ? ?Pertinent Vitals/Pain Pain Assessment ?Pain Assessment: Faces ?Faces Pain Scale: Hurts little more ?Pain Location: R ear/temple ?Pain Descriptors / Indicators: Sharp, Sore ?Pain Intervention(s): Monitored during session  ? ? ?Home Living   ?  ?  ?  ?  ?  ?  ?  ?  ?  ?   ?  ?Prior Function    ?  ?  ?   ? ?PT Goals (current goals can now be found in the care plan section) Acute Rehab PT Goals ?Patient Stated Goal: get  the R arm working, but if not a good sling. ?PT Goal Formulation: With patient ?Time For Goal Achievement: 10/07/21 ?Potential to Achieve Goals: Fair ?Progress towards PT goals: Progressing toward goals ? ?  ?Frequency ? ? ? Min 4X/week ? ? ? ?  ?PT Plan Current plan remains appropriate  ? ? ?Co-evaluation   ?  ?  ?  ?  ? ?  ?AM-PAC PT "6 Clicks" Mobility   ?Outcome Measure ? Help needed turning from your back to your side while in a flat bed without using bedrails?: None ?Help needed moving from lying on your back to sitting on the side of a flat bed without using bedrails?: None ?Help needed moving to and from a bed to a chair (including a wheelchair)?: A Little ?Help needed standing up from a chair using your arms (e.g., wheelchair or bedside chair)?: A Little ?Help needed to walk in hospital room?: A Little ?Help needed climbing 3-5 steps with a railing? : A Little ?6 Click Score: 20 ? ?  ?End of Session   ?Activity Tolerance: Patient tolerated treatment well ?Patient left: in bed;with call bell/phone within reach ?Nurse Communication: Mobility status ?PT Visit Diagnosis: Other abnormalities of gait and mobility (R26.89);Other symptoms and signs involving the nervous system (R29.898) ?Hemiplegia - Right/Left: Right ?Hemiplegia - dominant/non-dominant: Dominant ?Hemiplegia - caused by: Cerebral infarction ?  ? ? ?Time: 2831-5176 ?PT Time Calculation (min) (ACUTE ONLY): 52 min ? ?Charges:  $Gait Training: 8-22 mins ?$Therapeutic Activity: 8-22 mins ?$Self Care/Home Management: 8-22          ?          ? ?10/02/2021 ? ?Ginger Carne., PT ?Acute Rehabilitation Services ?[phone]  (pager) ?[phone]  (office) ? ? ?Tessie Fass [place] ?10/02/2021, 2:25 PM ? ?

## 2021-10-02 NOTE — Progress Notes (Signed)
ANTICOAGULATION CONSULT NOTE - Follow Up Consult ? ?Pharmacy Consult for Warfarin ?Indication:  rheumatic valve stenosis; CVA ? ?Allergies  ?Allergen Reactions  ? Peanut-Containing Drug Products Itching  ? Shellfish Allergy Swelling  ? ? ?Patient Measurements: ?Height: '5\' 5"'$  (165.1 cm) ?Weight: 80 kg (176 lb 5.9 oz) ?IBW/kg (Calculated) : 61.5 ? ?Vital Signs: ?Temp: 98.1 ?F (36.7 ?C) (03/13 0258) ?Temp Source: Oral (03/13 5277) ?BP: 117/75 (03/13 8242) ?Pulse Rate: 87 (03/13 0812) ? ?Labs: ?Recent Labs  ?  09/30/21 ?0128 10/01/21 ?0023 10/02/21 ?3536  ?HGB 12.5* 11.8*  --   ?HCT 39.0 37.2*  --   ?PLT 474* 402*  --   ?LABPROT 25.3*  --  29.5*  ?INR 2.3*  --  2.8*  ?CREATININE 1.47*  --   --   ? ? ? ?Estimated Creatinine Clearance: 59.2 mL/min (A) (by C-G formula based on SCr of 1.47 mg/dL (H)). ? 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 Pharmacy consulted to dose warfarin on 2/18. S/p TNK on 2/17 at ~9:30am. TEE negative for thrombus and Cardiology recommends 30-day cardiac event monitor as outpatient to rule out atrial fibrillation.  ? ?Warfarin 5 mg MWF and 7.5 mg TTSS  ? ?INR remains therapeutic.  ? ?Goal of Therapy:  ?INR 2-3 ?Monitor platelets by anticoagulation protocol: Yes ?  ?Plan:  ?Continue Warfarin 5 mg MWF and 7.5 mg TTSS. 7.5 mg  ?INR MWF ? ?Thank you ?Anette Guarneri, PharmD ?10/02/2021 9:39 AM ? ? ? ? ? ?

## 2021-10-02 NOTE — Progress Notes (Signed)
Heart Failure Stewardship Pharmacist Progress Note ? ? ?PCP: Placey, Audrea Muscat, NP ?PCP-Cardiologist: None  ? ? ?HPI:  ?51 yo M with PMH of vestibular schwannoma, asthma, alcohol and cocaine use. He presented to the ED on 2/17 as a code stroke. Received TNK in ED. CXR with pulmonary vascular congestion and mild pulmonary edema. ECHO done on 2/17 with LVEF 25-30%, G2DD, and mildly reduced RV as well as severe mitral stenosis, mild-moderate mitral regurgitation, and moderate-severe tricuspid regurgitation. Suspected rheumatic valve disease. TEE on 2/20 with mild tricuspid regurgitation and findings concerning for rheumatic heart disease but no extensive chordal thickening.  ? ?Current HF Medications: ?Beta Blocker: carvedilol 3.125 mg BID ?ACE/ARB/ARNI: Delene Loll 24/26 mg BID ?SGLT2i: Jardiance 10 mg daily ?Other: BiDil 1 tab TID ? ?Prior to admission HF Medications: ?None ? ?Pertinent Lab Values: ?Serum creatinine 1.47, BUN 34, Potassium 4.7, Sodium 135, BNP 1570, A1c 5.6  ? ?Vital Signs: ?Weight: 176 lbs (admission weight: 176 lbs) ?Blood pressure:100/70s  ?Heart rate: 90-100s  ?I/O not documented ? ?Medication Assistance / Insurance Benefits Check: ?Does the patient have prescription insurance?  No ? ?Does the patient qualify for medication assistance through manufacturers or grants?   Pending ?Eligible grants and/or patient assistance programs: Entresto/Jardiance if meets income requirements ?Medication assistance applications in progress: none  ?Medication assistance applications approved: none ?Approved medication assistance renewals will be completed by: TBD ? ?Outpatient Pharmacy:  ?Prior to admission outpatient pharmacy: [facility] ?Is the patient willing to use [facility] pharmacy at discharge? Yes ?Is the patient willing to transition their outpatient pharmacy to utilize a [facility] outpatient pharmacy?   Pending ?  ? ?Assessment: ?1. Acute systolic CHF (EF 65-03%), due to presumed NICM in the setting of  uncontrolled HTN and cocaine/alcohol use. NYHA class II symptoms. ?- Continue carvedilol 3.125 mg BID ?- Continue Entresto 24/26 mg BID ?- Consider adding spironolactone 12.5 mg daily if K/BP allow ?- Continue Jardiance 10 mg daily ?- Continue BiDil 1 tab TID ?- LHC planned as outpatient - discussed with cardiology team and there are no financial concerns with insurance status  ?  ?Plan: ?1) Medication changes recommended at this time: ?- Continue current regimen ? ?2) Patient assistance: ?- Uninsured - HF TOC appt scheduled and will help with pt assistance at that time. Medicaid pending ? ?3)  Education  ?- To be completed prior to discharge ? ?Kerby Nora, PharmD, BCPS ?Heart Failure Stewardship Pharmacist ?Phone [phone] ? ? ?

## 2021-10-02 NOTE — Progress Notes (Signed)
ANTICOAGULATION CONSULT NOTE  ?Pharmacy Consult for Warfarin ?Indication:  rheumatic valve stenosis; CVA ? ?Labs: ?Recent Labs  ?  09/30/21 ?0128 10/01/21 ?0023  ?HGB 12.5* 11.8*  ?HCT 39.0 37.2*  ?PLT 474* 402*  ?LABPROT 25.3*  --   ?INR 2.3*  --   ?CREATININE 1.47*  --   ? ?Estimated Creatinine Clearance: 59.2 mL/min (A) (by C-G formula based on SCr of 1.47 mg/dL (H)). ? 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 Pharmacy consulted to dose warfarin on 2/18. S/p TNK on 2/17 at ~9:30am. TEE negative for thrombus and Cardiology recommends 30-day cardiac event monitor as outpatient to rule out atrial fibrillation.  ? ?Regimen: Warfarin 5 mg MWF and 7.5 mg TTSS  ? ?INR: 2.8, therapeutic. No s/s of overt bleeding and CBC stable. ? ?Goal of Therapy:  ?INR 2-3 ?Monitor platelets by anticoagulation protocol: Yes ?  ?Plan:  ?Continue Warfarin 5 mg MWF and 7.5 mg TTSS.  ?Check INR on MWF ?Monitor for s/s bleeding ? ?Thank you for allowing pharmacy to participate in this patient's care. ? ?Levonne Spiller, PharmD ?PGY1 Acute Care Resident  ?10/02/2021,7:06 AM ? ? ? ? ? ? ?

## 2021-10-03 ENCOUNTER — Other Ambulatory Visit (HOSPITAL_COMMUNITY): Payer: Self-pay

## 2021-10-03 ENCOUNTER — Inpatient Hospital Stay (HOSPITAL_COMMUNITY): Payer: Medicaid Other

## 2021-10-03 MED ORDER — TRAMADOL HCL 50 MG PO TABS
50.0000 mg | ORAL_TABLET | Freq: Four times a day (QID) | ORAL | 0 refills | Status: AC | PRN
  Filled 2021-10-03: qty 10, 3d supply, fill #0

## 2021-10-03 MED ORDER — NICOTINE 7 MG/24HR TD PT24
7.0000 mg | MEDICATED_PATCH | Freq: Every day | TRANSDERMAL | 0 refills | Status: DC
  Filled 2021-10-03: qty 30, 30d supply, fill #0

## 2021-10-03 MED ORDER — ISOSORB DINITRATE-HYDRALAZINE 20-37.5 MG PO TABS
1.0000 | ORAL_TABLET | Freq: Three times a day (TID) | ORAL | 1 refills | Status: DC
  Filled 2021-10-03: qty 90, 30d supply, fill #0

## 2021-10-03 MED ORDER — POLYVINYL ALCOHOL 1.4 % OP SOLN
2.0000 [drp] | OPHTHALMIC | 0 refills | Status: DC | PRN
  Filled 2021-10-03: qty 15, fill #0

## 2021-10-03 MED ORDER — CARVEDILOL 3.125 MG PO TABS
3.1250 mg | ORAL_TABLET | Freq: Two times a day (BID) | ORAL | 1 refills | Status: DC
  Filled 2021-10-03: qty 60, 30d supply, fill #0

## 2021-10-03 MED ORDER — CALAMINE EX LOTN
1.0000 "application " | TOPICAL_LOTION | CUTANEOUS | 0 refills | Status: DC | PRN
  Filled 2021-10-03: qty 60, fill #0

## 2021-10-03 MED ORDER — WARFARIN SODIUM 7.5 MG PO TABS
ORAL_TABLET | ORAL | 0 refills | Status: DC
Start: 2021-10-03 — End: 2021-10-16
  Filled 2021-10-03: qty 30, 8d supply, fill #0

## 2021-10-03 MED ORDER — EMPAGLIFLOZIN 10 MG PO TABS
10.0000 mg | ORAL_TABLET | Freq: Every day | ORAL | 1 refills | Status: DC
  Filled 2021-10-03: qty 30, 30d supply, fill #0

## 2021-10-03 MED ORDER — SACUBITRIL-VALSARTAN 24-26 MG PO TABS
1.0000 | ORAL_TABLET | Freq: Two times a day (BID) | ORAL | 1 refills | Status: DC
  Filled 2021-10-03: qty 60, 30d supply, fill #0

## 2021-10-03 MED ORDER — ACETIC ACID 2 % OT SOLN
4.0000 [drp] | OTIC | 0 refills | Status: DC | PRN
  Filled 2021-10-03: qty 15, fill #0

## 2021-10-03 MED ORDER — ATORVASTATIN CALCIUM 40 MG PO TABS
40.0000 mg | ORAL_TABLET | Freq: Every day | ORAL | 1 refills | Status: DC
  Filled 2021-10-03: qty 30, 30d supply, fill #0

## 2021-10-03 MED ORDER — SALINE NASAL SPRAY 0.65 % NA SOLN
1.0000 | NASAL | 0 refills | Status: DC | PRN
  Filled 2021-10-03: qty 44, 30d supply, fill #0

## 2021-10-03 MED ORDER — WARFARIN SODIUM 5 MG PO TABS
ORAL_TABLET | ORAL | 0 refills | Status: DC
  Filled 2021-10-03: qty 30, 10d supply, fill #0

## 2021-10-03 NOTE — Progress Notes (Signed)
Craig Schmidt to be D/C'd home per MD order. Discussed with the patient and all questions fully answered.  ?Skin clean, dry and intact without evidence of skin break down, no evidence of skin tears noted.  ?IV catheter discontinued intact. Site without signs and symptoms of complications. Dressing and pressure applied.  ?An After Visit Summary was printed and given to the patient. TOC meds, cane, pill box, and all belongings sent with patient. Sling applied. ?Patient escorted via Craig Schmidt, and D/C home via taxi.  ?[name]  ?10/03/2021 6:46 PM ?  ?   ?

## 2021-10-03 NOTE — Progress Notes (Signed)
Occupational Therapy Treatment ?Patient Details ?Name: Craig Schmidt ?MRN: [id number] ?DOB: 1971-06-19 ?Today's Date: 10/03/2021 ? ? ?History of present illness 51 y.o. male presented to the emergency room for sudden onset of right-sided weakness. TNKase given. 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 PHMx:schwannoma, cocaine abuse which he says he has not used over the past few weeks. ?  ?OT comments ? Patient received in bed and agreeable to RUE AAROM/SROM exercises but did not want to get out of bed due to headache/ear ache.  Patient led in SROM of RUE shoulder and elbow and AAROM for wrist and hand.  Patient with limited tolerance to OT treatment on this date due to pain.  Acute OT to continue to follow.   ? ?Recommendations for follow up therapy are one component of a multi-disciplinary discharge planning process, led by the attending physician.  Recommendations may be updated based on patient status, additional functional criteria and insurance authorization. ?   ?Follow Up Recommendations ? Skilled nursing-short term rehab (<3 hours/day)  ?  ?Assistance Recommended at Discharge Frequent or constant Supervision/Assistance  ?Patient can return home with the following ? A little help with walking and/or transfers;A lot of help with bathing/dressing/bathroom;Assistance with cooking/housework;Direct supervision/assist for medications management;Direct supervision/assist for financial management;Assist for transportation;Help with stairs or ramp for entrance ?  ?Equipment Recommendations ? None recommended by OT  ?  ?Recommendations for Other Services   ? ?  ?Precautions / Restrictions Precautions ?Precautions: Fall ?Precaution Comments: R UE flaccid ?Required Braces or Orthoses: Sling ?Restrictions ?Weight Bearing Restrictions: No  ? ? ?  ? ?Mobility Bed  Mobility ?Overal bed mobility: Modified Independent ?  ?  ?  ?  ?  ?  ?General bed mobility comments: able to roll in bed and position self without assistance ?  ? ?Transfers ?  ?  ?  ?  ?  ?  ?  ?  ?  ?  ?  ?  ?Balance   ?  ?  ?  ?  ?  ?  ?  ?  ?  ?  ?  ?  ?  ?  ?  ?  ?  ?  ?   ? ?ADL either performed or assessed with clinical judgement  ? ?ADL   ?  ?  ?  ?  ?  ?  ?  ?  ?  ?  ?  ?  ?  ?  ?  ?  ?  ?  ?  ?  ?  ? ?Extremity/Trunk Assessment Upper Extremity Assessment ?RUE Deficits / Details: Able to perform scapular retraction, shoulder extension, and elbow flexion (with gravity eleminated). Unable to shrugg shoudler. No sensation at hand and decreased sensation at vetral forearm. Sensation returning at midforearm - sublix noted 3/6 ?RUE Sensation: decreased light touch ?RUE Coordination: decreased fine motor;decreased gross motor ?  ?  ?  ?  ?  ? ?Vision   ?  ?  ?Perception   ?  ?Praxis   ?  ? ?Cognition Arousal/Alertness: Awake/alert ?Behavior During Therapy: [facility] for tasks assessed/performed ?Overall Cognitive Status: Within Functional Limits for tasks assessed ?  ?  ?  ?  ?  ?  ?  ?  ?  ?  ?  ?  ?  ?  ?  ?  ?General Comments: Patient concerned over possible discharge today ?  ?  ?   ?  Exercises Exercises: Other exercises ?Other Exercises ?Other Exercises: Self PROM of RUE ?Other Exercises: AAROM of elbow, wrist, and hand ? ?  ?Shoulder Instructions   ? ? ?  ?General Comments    ? ? ?Pertinent Vitals/ Pain       Pain Assessment ?Pain Assessment: Faces ?Faces Pain Scale: Hurts even more ?Pain Location: right ear, headache ?Pain Descriptors / Indicators: Aching, Grimacing, Sore, Headache ?Pain Intervention(s): Limited activity within patient's tolerance, Monitored during session, Repositioned ? ?Home Living   ?  ?  ?  ?  ?  ?  ?  ?  ?  ?  ?  ?  ?  ?  ?  ?  ?  ?  ? ?  ?Prior Functioning/Environment    ?  ?  ?  ?   ? ?Frequency ? Min 2X/week  ? ? ? ? ?  ?Progress Toward Goals ? ?OT Goals(current goals can now be found in  the care plan section) ? Progress towards OT goals: Not progressing toward goals - comment (limited by pain on this date) ? ?Acute Rehab OT Goals ?Patient Stated Goal: get better ?OT Goal Formulation: With patient ?Time For Goal Achievement: 10/12/21 ?Potential to Achieve Goals: Good ?ADL Goals ?Pt Will Perform Grooming: Independently;standing ?Pt Will Perform Upper Body Bathing: sitting;with set-up ?Pt Will Perform Lower Body Bathing: sit to/from stand;with set-up ?Pt Will Transfer to Toilet: Independently;ambulating ?Pt/caregiver will Perform Home Exercise Program: Increased ROM;Right Upper extremity;With written HEP provided ?Additional ADL Goal #1: Pt will be S in and OOB for basic ADLs ?Additional ADL Goal #2: Continue to assess vision  ?Plan Discharge plan remains appropriate   ? ?Co-evaluation ? ? ?   ?  ?  ?  ?  ? ?  ?AM-PAC OT "6 Clicks" Daily Activity     ?Outcome Measure ? ? Help from another person eating meals?: A Little ?Help from another person taking care of personal grooming?: A Little ?Help from another person toileting, which includes using toliet, bedpan, or urinal?: A Little ?Help from another person bathing (including washing, rinsing, drying)?: A Lot ?Help from another person to put on and taking off regular upper body clothing?: A Lot ?Help from another person to put on and taking off regular lower body clothing?: A Lot ?6 Click Score: 15 ? ?  ?End of Session   ? ?OT Visit Diagnosis: Other abnormalities of gait and mobility (R26.89);Muscle weakness (generalized) (M62.81);Other symptoms and signs involving cognitive function;Hemiplegia and hemiparesis;Pain ?Hemiplegia - Right/Left: Right ?Hemiplegia - dominant/non-dominant: Dominant ?Hemiplegia - caused by: Cerebral infarction ?Pain - Right/Left: Right ?Pain - part of body: Hand;Arm ?  ?Activity Tolerance Patient limited by pain ?  ?Patient Left in bed;with call bell/phone within reach ?  ?Nurse Communication Patient requests pain meds ?  ? ?    ? ?Time: 1884-1660 ?OT Time Calculation (min): 16 min ? ?Charges: OT General Charges ?$OT Visit: 1 Visit ?OT Treatments ?$Neuromuscular Re-education: 8-22 mins ? ?Lodema Hong, OTA ?Acute Rehabilitation Services  ?Pager [phone] ?Office [phone] ? ? ?[place] ?10/03/2021, 10:02 AM ?

## 2021-10-03 NOTE — Discharge Summary (Addendum)
Stroke Discharge Summary  ?Patient ID: Craig Schmidt    l ?  MRN: [id number]    ?  DOB: April 01, 1971 ? ?Date of Admission: 09/08/2021 ?Date of Discharge: 10/03/2021 ? ?Attending Physician:  Stroke, Md, MD, Stroke MD ?Consultant(s):    cardiology and ENT   ?Patient's PCP:  Marliss Coots, NP ? ?DISCHARGE DIAGNOSIS: left MCA stroke ?Principal Problem: ?  Acute ischemic stroke ([place]) ?Active Problems: ?  Ischemic cardiomyopathy ?  Rheumatic mitral valve disease ?  Cocaine abuse ([place]) ?  Smoker ?  Alcohol abuse ?  TMJ dysfunction ?  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 ? ?Allergies as of 10/03/2021   ? ?   Reactions  ? Peanut-containing Drug Products Itching  ? Shellfish Allergy Swelling  ? ?  ? ?  ?Medication List  ?  ? ?STOP taking these medications   ? ?ibuprofen 200 MG tablet ?Commonly known as: ADVIL ?  ? ?  ? ?TAKE these medications   ? ?acetic acid 2 % otic solution ?Place 4 drops into the right ear as needed (right ear pain). ?  ?atorvastatin 40 MG tablet ?Commonly known as: LIPITOR ?Take 1 tablet (40 mg total) by mouth daily. ?Start taking on: October 04, 2021 ?  ?calamine lotion ?Apply 1 application. topically as needed for itching. ?  ?carvedilol 3.125 MG tablet ?Commonly known as: COREG ?Take 1 tablet (3.125 mg total) by mouth 2 (two) times daily with a meal. ?  ?empagliflozin 10 MG Tabs tablet ?Commonly known as: JARDIANCE ?Take 1 tablet (10 mg total) by mouth daily. ?Start taking on: October 04, 2021 ?  ?isosorbide-hydrALAZINE 20-37.5 MG tablet ?Commonly known as: BIDIL ?Take 1 tablet by mouth 3 (three) times daily. ?  ?nicotine 7 mg/24hr patch ?Commonly known as: NICODERM CQ - dosed in mg/24 hr ?Place 1 patch (7 mg total) onto the skin daily. ?Start taking on: October 04, 2021 ?  ?polyvinyl alcohol 1.4 % ophthalmic solution ?Commonly known as: LIQUIFILM TEARS ?Place 2 drops into both eyes as needed for dry eyes. ?  ?sacubitril-valsartan 24-26 MG ?Commonly known as: ENTRESTO ?Take 1 tablet by mouth 2 (two) times  daily. ?  ?sodium chloride 0.65 % Soln nasal spray ?Commonly known as: OCEAN ?Place 1 spray into both nostrils as needed for congestion. ?  ?traMADol 50 MG tablet ?Commonly known as: ULTRAM ?Take 1 tablet (50 mg total) by mouth every 6 (six) hours as needed for up to 5 days for moderate pain or severe pain. ?  ?warfarin 7.5 MG tablet ?Commonly known as: COUMADIN ?Take one 7.5 mg tablet on Sunday, Tuesday, Thursday and Saturday ?  ?warfarin 5 MG tablet ?Commonly known as: COUMADIN ?Take one 5 mg tablet every Monday, Wednesday and Friday ?  ? ?  ? ?  ?  ? ? ?  ?Durable Medical Equipment  ?(From admission, onward)  ?  ? ? ?  ? ?  Start     Ordered  ? 10/03/21 0837  For home use only DME [place]  Once       ? 10/03/21 0836  ? ?  ?  ? ?  ? ? ?LABORATORY STUDIES ?CBC ?   ?Component Value Date/Time  ? WBC 7.6 10/01/2021 0023  ? RBC 4.22 10/01/2021 0023  ? HGB 11.8 (L) 10/01/2021 0023  ? HCT 37.2 (L) 10/01/2021 0023  ? PLT 402 (H) 10/01/2021 0023  ? MCV 88.2 10/01/2021 0023  ? MCH 28.0 10/01/2021 0023  ? MCHC 31.7 10/01/2021 0023  ?  RDW 14.1 10/01/2021 0023  ? LYMPHSABS 1.8 09/08/2021 0915  ? MONOABS 0.7 09/08/2021 0915  ? EOSABS 0.3 09/08/2021 0915  ? BASOSABS 0.0 09/08/2021 0915  ? ?CMP ?   ?Component Value Date/Time  ? NA 135 09/30/2021 0128  ? K 4.7 09/30/2021 0128  ? CL 103 09/30/2021 0128  ? CO2 24 09/30/2021 0128  ? GLUCOSE 105 (H) 09/30/2021 0128  ? BUN 34 (H) 09/30/2021 0128  ? CREATININE 1.47 (H) 09/30/2021 0128  ? CALCIUM 9.4 09/30/2021 0128  ? PROT 6.3 (L) 09/08/2021 0915  ? ALBUMIN 2.9 (L) 09/08/2021 0915  ? AST 59 (H) 09/08/2021 0915  ? ALT 44 09/08/2021 0915  ? ALKPHOS 132 (H) 09/08/2021 0915  ? BILITOT 0.7 09/08/2021 0915  ? GFRNONAA 58 (L) 09/30/2021 0128  ? GFRAA 52 (L) 10/03/2018 1934  ? ?COAGS ?Lab Results  ?Component Value Date  ? INR 2.8 (H) 10/02/2021  ? INR 2.3 (H) 09/30/2021  ? INR 2.2 (H) 09/29/2021  ? ?Lipid Panel ?   ?Component Value Date/Time  ? CHOL 110 09/09/2021 0411  ? TRIG 60 09/09/2021 0411   ? TRIG 61 09/09/2021 0411  ? HDL 21 (L) 09/09/2021 0411  ? CHOLHDL 5.2 09/09/2021 0411  ? VLDL 12 09/09/2021 0411  ? [place] 77 09/09/2021 0411  ? ?HgbA1C  ?Lab Results  ?Component Value Date  ? HGBA1C 5.6 09/09/2021  ? ?Urinalysis ?   ?Component Value Date/Time  ? [place] YELLOW 09/08/2021 1247  ? APPEARANCEUR CLEAR 09/08/2021 1247  ? LABSPEC 1.030 09/08/2021 1247  ? PHURINE 5.0 09/08/2021 1247  ? GLUCOSEU NEGATIVE 09/08/2021 1247  ? [place] NEGATIVE 09/08/2021 1247  ? [place] NEGATIVE 09/08/2021 1247  ? [place] NEGATIVE 09/08/2021 1247  ? PROTEINUR 30 (A) 09/08/2021 1247  ? UROBILINOGEN 0.2 05/14/2015 0845  ? NITRITE NEGATIVE 09/08/2021 1247  ? LEUKOCYTESUR NEGATIVE 09/08/2021 1247  ? ?Urine Drug Screen  ?   ?Component Value Date/Time  ? LABOPIA NONE DETECTED 09/08/2021 1247  ? COCAINSCRNUR POSITIVE (A) 09/08/2021 1247  ? LABBENZ NONE DETECTED 09/08/2021 1247  ? AMPHETMU NONE DETECTED 09/08/2021 1247  ? THCU NONE DETECTED 09/08/2021 1247  ? LABBARB NONE DETECTED 09/08/2021 1247  ?  ?Alcohol Level ?   ?Component Value Date/Time  ? ETH <10 09/08/2021 0915  ? ? ? ?SIGNIFICANT DIAGNOSTIC STUDIES ?CT HEAD WO CONTRAST (5MM) ? ?Result Date: 10/03/2021 ?CLINICAL DATA:  Stroke, follow-up; headache EXAM: CT HEAD WITHOUT CONTRAST TECHNIQUE: Contiguous axial images were obtained from the base of the skull through the vertex without intravenous contrast. RADIATION DOSE REDUCTION: This exam was performed according to the departmental dose-optimization program which includes automated exposure control, adjustment of the mA and/or kV according to patient size and/or use of iterative reconstruction technique. COMPARISON:  09/09/2021 CT head FINDINGS: Brain: No evidence of acute infarction, hemorrhage, cerebral edema, parenchymal mass, mass effect, or midline shift. Previously noted left MCA territory hypodensity is no longer appreciated. Posterior fossa arachnoid cyst versus mega cisterna magna. No hydrocephalus or  extra-axial fluid collection. Left cerebellopontine angle vestibular schwannoma is best seen on the coronal image (series 5, image 42), without appreciable change from the prior exam. Vascular: No hyperdense vessel. Skull: Normal. Negative for fracture or focal lesion. Sinuses/Orbits: No acute finding. Other: The mastoid air cells are well aerated. IMPRESSION: No acute intracranial process. Electronically Signed   By: Merilyn Baba M.D.   On: 10/03/2021 02:47  ? ?CT HEAD WO CONTRAST (5MM) ? ?Result Date: 09/09/2021 ?CLINICAL DATA:  51 year old male code  stroke presentation yesterday. Acute bilateral cerebral and cerebellar infarcts on MRI. EXAM: CT HEAD WITHOUT CONTRAST TECHNIQUE: Contiguous axial images were obtained from the base of the skull through the vertex without intravenous contrast. RADIATION DOSE REDUCTION: This exam was performed according to the departmental dose-optimization program which includes automated exposure control, adjustment of the mA and/or kV according to patient size and/or use of iterative reconstruction technique. COMPARISON:  Brain MRI, head CT and CTA head and neck yesterday. FINDINGS: Brain: Increased conspicuity of cytotoxic edema corresponding to abnormal DWI yesterday, most pronounced in the posterior left MCA territory and not definitely extended since the MRI (series 3, images 18 through 24). No associated hemorrhage or mass effect. No ventriculomegaly. Deep gray matter nuclei and brainstem appear a stable. 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 Vascular: No suspicious intracranial vascular hyperdensity. Skull: No acute osseous abnormality identified. Sinuses/Orbits: Visualized paranasal sinuses and mastoids are stable and well aerated. Other: Disconjugate gaze. No other acute orbit or scalp soft tissue finding. IMPRESSION: 1. Expected CT appearance of the acute cortical infarcts on MRI yesterday, most confluent in the posterior  Left MCA territory. No hemorrhagic transformation or mass effect. 2. Stable left cerebellopontine angle vestibular schwannoma. Electronically Signed   By: Genevie Ann M.D.   On: 09/09/2021 10:29  ? ?MR BRAIN WO CONTRAST

## 2021-10-03 NOTE — Progress Notes (Signed)
Heart Failure Stewardship Pharmacist Progress Note ? ? ?PCP: Placey, Audrea Muscat, NP ?PCP-Cardiologist: None  ? ? ?HPI:  ?51 yo M with PMH of vestibular schwannoma, asthma, alcohol and cocaine use. He presented to the ED on 2/17 as a code stroke. Received TNK in ED. CXR with pulmonary vascular congestion and mild pulmonary edema. ECHO done on 2/17 with LVEF 25-30%, G2DD, and mildly reduced RV as well as severe mitral stenosis, mild-moderate mitral regurgitation, and moderate-severe tricuspid regurgitation. Suspected rheumatic valve disease. TEE on 2/20 with mild tricuspid regurgitation and findings concerning for rheumatic heart disease but no extensive chordal thickening.  ? ?Current HF Medications: ?Beta Blocker: carvedilol 3.125 mg BID ?ACE/ARB/ARNI: Delene Loll 24/26 mg BID ?SGLT2i: Jardiance 10 mg daily ?Other: BiDil 1 tab TID ? ?Prior to admission HF Medications: ?None ? ?Pertinent Lab Values: ?As of 3/11: Serum creatinine 1.47, BUN 34, Potassium 4.7, Sodium 135, BNP 1570, A1c 5.6  ? ?Vital Signs: ?Weight: 176 lbs (admission weight: 176 lbs) ?Blood pressure: 110/70s  ?Heart rate: 80s  ?I/O not documented ? ?Medication Assistance / Insurance Benefits Check: ?Does the patient have prescription insurance?  No ? ?Does the patient qualify for medication assistance through manufacturers or grants?   Pending ?Eligible grants and/or patient assistance programs: Entresto/Jardiance if meets income requirements ?Medication assistance applications in progress: none  ?Medication assistance applications approved: none ?Approved medication assistance renewals will be completed by: TBD ? ?Outpatient Pharmacy:  ?Prior to admission outpatient pharmacy: [facility] ?Is the patient willing to use [facility] at discharge? Yes ?Is the patient willing to transition their outpatient pharmacy to utilize a [facility] outpatient pharmacy?   Pending ?  ? ?Assessment: ?1. Acute systolic CHF (EF 90-24%), due to presumed NICM in the setting  of uncontrolled HTN and cocaine/alcohol use. NYHA class II symptoms. ?- Continue carvedilol 3.125 mg BID ?- Continue Entresto 24/26 mg BID ?- Consider adding spironolactone 12.5 mg daily if K/BP allow ?- Continue Jardiance 10 mg daily ?- Continue BiDil 1 tab TID ?- LHC planned as outpatient - discussed with cardiology team and there are no financial concerns with insurance status  ?  ?Plan: ?1) Medication changes recommended at this time: ?- Continue current regimen ? ?2) Patient assistance: ?- Uninsured - HF TOC appt scheduled and will help with pt assistance at that time. Medicaid pending ? ?3)  Education  ?- To be completed prior to discharge ? ?Kerby Nora, PharmD, BCPS ?Heart Failure Stewardship Pharmacist ?Phone [phone] ? ? ?

## 2021-10-03 NOTE — TOC Transition Note (Signed)
Transition of Care (TOC) - CM/SW Discharge Note ? ? ?Patient Details  ?Name: Craig Schmidt ?MRN: [id number] ?Date of Birth: 30-Oct-1970 ? ?Transition of Care [facility]) CM/SW Contact:  ?Geralynn Ochs, LCSW ?Phone Number: ?10/03/2021, 1:55 PM ? ? ?Clinical Narrative:   Patient discharging back to the street today. VISPDAT completed, sent to [facility] ending homelessness. CSW spoke with [facility] team to discuss follow up, they will assist with clinic follow up and transportation needs. CSW met with patient, reminded him of his appointments, and provided bus passes for appointment. Medications covered via [place]. No other needs identified at this time. ? ? ? ?Final next level of care: Homeless Shelter ?Barriers to Discharge: Barriers Resolved ? ? ?Patient Goals and CMS Choice ?Patient states their goals for this hospitalization and ongoing recovery are:: "Just talk to my sister." ?CMS Medicare.gov Compare Post Acute Care list provided to:: Patient ?Choice offered to / list presented to : Patient, Sibling ? ?Discharge Placement ?  ?           ?  ?Patient to be transferred to facility by: Cab ?Name of family member notified: Self ?Patient and family notified of of transfer: 10/03/21 ? ?Discharge Plan and Services ?  ?  ?Post Acute Care Choice: [place]          ?  ?  ?  ?  ?  ?  ?  ?  ?  ?  ? ?Social Determinants of Health (SDOH) Interventions ?Food Insecurity Interventions: Intervention Not Indicated ?Financial Strain Interventions: Other (Comment) (HF CSW/RNCM consulted) ?Housing Interventions: Other (Comment) (HF CSW consulted) ?Transportation Interventions: Other (Comment) (HF CSW consulted) ?Alcohol Brief Interventions/Follow-up: Alcohol education/Brief advice ? ? ?Readmission Risk Interventions ?No flowsheet data found. ? ? ? ? ?

## 2021-10-04 ENCOUNTER — Telehealth (HOSPITAL_COMMUNITY): Payer: Self-pay | Admitting: Licensed Clinical Social Worker

## 2021-10-04 NOTE — Telephone Encounter (Signed)
CSW called pt to discuss transportation barriers to appt Friday and concerns with homelessness at this time.  Unable to reach- left VM requesting return call ? ?Jorge Ny, LCSW ?Clinical Social Worker ?[facility] ?Desk#: [phone] ?Cell#: [phone] ? ?

## 2021-10-06 ENCOUNTER — Encounter (HOSPITAL_COMMUNITY): Payer: Self-pay

## 2021-10-10 ENCOUNTER — Ambulatory Visit: Payer: Self-pay | Admitting: Internal Medicine

## 2021-10-12 NOTE — Patient Outreach (Signed)
?  Received a red flag Emmi stroke notification for Craig Schmidt. ?I have assigned Joellyn Quails, RN to call for follow up and determine if there are any Case Management needs.  ?  ?Arville Care, CBCS, CMAA ?[place] Management Assistant ?[facility] ?[phone]   ?

## 2021-10-13 ENCOUNTER — Other Ambulatory Visit: Payer: Self-pay | Admitting: *Deleted

## 2021-10-13 NOTE — Patient Outreach (Signed)
[facility]) Care Management ?Telephonic RN Care Manager Note ? ? ?10/13/2021 ?Name:  Craig Schmidt MRN:  [id number] DOB:  1970-08-20 ? ?Summary: ?[facility] Unsuccessful outreach ? ? ?Outreach attempt to the listed at the preferred outreach number in [place] ?No answer. [facility] RN CM left HIPAA [facility] Portability and Accountability Act) compliant voicemail message along with CM?s contact info.  ? ? ? ?Subjective: ?Craig Schmidt is an 51 y.o. year old male who is a primary patient of Placey, Audrea Muscat, NP. Listed with medicaid pending coverage.  The care management team was consulted for assistance with care management and/or care coordination needs.  He was referred to RN CM on 10/12/21 after he received an EMMI stroke outreach on Wednesday 10/11/21 at 1001 Response Questions/problems with meds? Yes ? ?Telephonic RN Care Manager completed Telephone Visit today.  ? ?Objective: ? ?Medications Reviewed Today   ? ? Reviewed by Orlan Leavens, RN (Registered Nurse) on 09/11/21 at 49  Med List Status: Complete  ? ?Medication Order Taking? Sig Documenting Provider Last Dose Status Informant  ?ibuprofen (ADVIL) 200 MG tablet [id number] Yes Take 400 mg by mouth every 6 (six) hours as needed (anxiety/panic attacks). [provider] Past Week Active Self  ?         ?Med Note Thayer Ohm, RACHEL J   Fri Sep 08, 2021  2:45 PM) Verified with py multiple times that he takes this for anxiety/panic. [place] he does   ? ?  ?  ? ?  ? ? ? ?SDOH:  (Social Determinants of Health) assessments and interventions performed:  ? ? ?Care Plan ? ?Review of patient past medical history, allergies, medications, health status, including review of consultants reports, laboratory and other test data, was performed as part of comprehensive evaluation for care management services.  ? ?There are no care plans that you recently modified to display for this patient. ?  ?Plan: ?[facility] RN CM scheduled this patient for  another call attempt within 4-7 business days ?Unsuccessful outreach letter sent on 10/13/21 ?Unsuccessful outreach on 10/13/21  ? ? ?Breeona Waid L. Lavina Hamman, RN, BSN, CCM ?[place] Management Care Coordinator ?Office number [phone] ? ? ?

## 2021-10-16 ENCOUNTER — Ambulatory Visit (INDEPENDENT_AMBULATORY_CARE_PROVIDER_SITE_OTHER): Payer: Self-pay | Admitting: Internal Medicine

## 2021-10-16 ENCOUNTER — Other Ambulatory Visit: Payer: Self-pay

## 2021-10-16 ENCOUNTER — Encounter: Payer: Self-pay | Admitting: Internal Medicine

## 2021-10-16 VITALS — BP 126/74 | HR 96 | Ht 65.0 in | Wt 168.8 lb

## 2021-10-16 DIAGNOSIS — I509 Heart failure, unspecified: Secondary | ICD-10-CM

## 2021-10-16 MED ORDER — APIXABAN 5 MG PO TABS: 5.0000 mg | ORAL_TABLET | Freq: Two times a day (BID) | ORAL | 3 refills | Status: DC

## 2021-10-16 NOTE — Progress Notes (Signed)
?Cardiology Office Note:   ? ?Date:  10/16/2021  ? ?ID:  Craig Schmidt, DOB Apr 25, 1971, MRN [id number] ? ?PCP:  Marliss Coots, NP ?  ?[facility] Providers ?Cardiologist:  Janina Mayo, MD    ? ?Referring MD: Marliss Coots, NP  ? ?No chief complaint on file. ?Hospital FU ? ?History of Present Illness:   ? ?Craig Schmidt is a 51 y.o. male with a hx of smoking, cocaine abuse, home insecurity, recent L MCA stroke , severe MS c/f rheumatic heart dx, schwannoma referral for hospital FU ? ?Mr Aro presented with R sided weakness and slurred speech on 09/08/2021. He underwent MRI that showed patchy multifocal acute ischemic nonhemorrhagic infarcts involving the left greater than right cerebral hemispheres and cerebellum. A thromboembolic source was suspected. With suspected embolic source and severe MS c/f rheumatic heart dx. He was started on coumadin with risk of afib.  His echo also showed severely reduced global hypokinesis with mild RV dysfunction. He was euvolemic and asymptomatic.  He had a TEE unclear whether he had rheumatic mitral valve dx vs parachute mitral valve. He was euvolemic and asymptomatic during his stay. He did not have evidence of atrial fibrillation and had nearly 1 month admission. He was hospitalized for and extended period 2/2 inability to get to rehab. ? ?He is currently homeless right now. He feels well. He denies dyspnea on exertion. He has been walking outside. He denies orthopnea, PND. No chest pressure. He has no LE edema.  ? ? ?Cardiology Studies ? ?TEE- EF 25-30%, No LAA thrombus, No LV thrombus.Severe MS-mean gradient 4.0 mmHg. MVA planimetry 1.45 cm2. Mild-Moderate MR ERO 21 mm2.  ? ?TTE- EF 25-30%, RV fxn mildly reduced. RVSP 49 mmHg,  severe MS c/f rheumatic heart dx; mean gradient 6 mmHg HR 99 bpm, MVA 1.3 cm2 ? ?Past Medical History:  ?Diagnosis Date  ? Asthma   ? Brain tumor ([place])   ? ? ?Past Surgical History:  ?Procedure Laterality Date  ? BUBBLE STUDY   09/11/2021  ? Procedure: BUBBLE STUDY;  Surgeon: Fay Records, MD;  Location: [place];  Service: Cardiovascular;;  ? TEE WITHOUT CARDIOVERSION N/A 09/11/2021  ? Procedure: TRANSESOPHAGEAL ECHOCARDIOGRAM (TEE);  Surgeon: Fay Records, MD;  Location: [place];  Service: Cardiovascular;  Laterality: N/A;  ? ? ?Current Medications: ?Current Meds  ?Medication Sig  ? acetic acid 2 % otic solution Place 4 drops into the right ear as needed (right ear pain).  ? apixaban (ELIQUIS) 5 MG TABS tablet Take 1 tablet (5 mg total) by mouth 2 (two) times daily.  ? atorvastatin (LIPITOR) 40 MG tablet Take 1 tablet (40 mg total) by mouth daily.  ? calamine lotion Apply 1 application. topically as needed for itching.  ? carvedilol (COREG) 3.125 MG tablet Take 1 tablet (3.125 mg total) by mouth 2 (two) times daily with a meal.  ? empagliflozin (JARDIANCE) 10 MG TABS tablet Take 1 tablet (10 mg total) by mouth daily.  ? isosorbide-hydrALAZINE (BIDIL) 20-37.5 MG tablet Take 1 tablet by mouth 3 (three) times daily.  ? nicotine (NICODERM CQ - DOSED IN MG/24 HR) 7 mg/24hr patch Place 1 patch (7 mg total) onto the skin daily.  ? polyvinyl alcohol (LIQUIFILM TEARS) 1.4 % ophthalmic solution Place 2 drops into both eyes as needed for dry eyes.  ? sacubitril-valsartan (ENTRESTO) 24-26 MG Take 1 tablet by mouth 2 (two) times daily.  ? sodium chloride (OCEAN) 0.65 % nasal spray Place 1  spray into both nostrils as needed for congestion.  ? [DISCONTINUED] warfarin (COUMADIN) 5 MG tablet Take 1 tablet (5 mg) by mouth every Monday, Wednesday and Friday  ? [DISCONTINUED] warfarin (COUMADIN) 7.5 MG tablet Take 1 tablet (7.5 mg) on Sunday, Tuesday, Thursday and Saturday  ?  ? ?Allergies:   Peanut-containing drug products and Shellfish allergy  ? ?Social History  ? ?Socioeconomic History  ? Marital status: Significant Other  ?  Spouse name: Not on file  ? Number of children: 7  ? Years of education: Not on file  ? Highest education level: 9th  grade  ?Occupational History  ? Occupation: unemployed  ?  Comment: experiencing homelessness-living at daily rent motels.  ?Tobacco Use  ? Smoking status: Every Day  ?  Packs/day: 1.00  ?  Years: 41.00  ?  Pack years: 41.00  ?  Types: Cigarettes  ?  Passive exposure: Past  ? Smokeless tobacco: Never  ?Vaping Use  ? Vaping Use: Never used  ?Substance and Sexual Activity  ? Alcohol use: Yes  ?  Alcohol/week: 63.0 standard drinks  ?  Types: 63 Cans of beer per week  ?  Comment: 3-40oz beer/day x10 years. 1-2 40oz beer/day in 20s/30s  ? Drug use: Yes  ?  Types: "Crack" cocaine  ?  Comment: last use 09/06/21  ? Sexual activity: Not on file  ?Other Topics Concern  ? Not on file  ?Social History Narrative  ? Not on file  ? ?Social Determinants of Health  ? ?Financial Resource Strain: High Risk  ? Difficulty of Paying Living Expenses: Very hard  ?Food Insecurity: No Food Insecurity  ? Worried About Charity fundraiser in the Last Year: Never true  ? Ran Out of Food in the Last Year: Never true  ?Transportation Needs: Unmet Transportation Needs  ? Lack of Transportation (Medical): Yes  ? Lack of Transportation (Non-Medical): Yes  ?Physical Activity: Not on file  ?Stress: Not on file  ?Social Connections: Not on file  ?  ? ?Family History: ?The patient's , reviewed not pertinent ? ?ROS:   ?Please see the history of present illness.    ? All other systems reviewed and are negative. ? ?EKGs/Labs/Other Studies Reviewed:   ? ?The following studies were reviewed today: ? ? ?EKG:  EKG is  ordered today.  The ekg ordered today demonstrates  ? ?EKG 10/16/2021-NSR, bi-atrial enlargement ? ?Recent Labs: ?09/08/2021: ALT 44 ?09/10/2021: B Natriuretic Peptide 1,570.7 ?09/30/2021: BUN 34; Creatinine, Ser 1.47; Potassium 4.7; Sodium 135 ?10/01/2021: Hemoglobin 11.8; Platelets 402  ?Recent Lipid Panel ?   ?Component Value Date/Time  ? CHOL 110 09/09/2021 0411  ? TRIG 60 09/09/2021 0411  ? TRIG 61 09/09/2021 0411  ? HDL 21 (L) 09/09/2021 0411  ?  CHOLHDL 5.2 09/09/2021 0411  ? VLDL 12 09/09/2021 0411  ? [place] 77 09/09/2021 0411  ? ? ? ?Risk Assessment/Calculations:   ?  ? ?    ? ?Physical Exam:   ? ?VS:   ? ?Vitals:  ? 10/16/21 1015  ?BP: 126/74  ?Pulse: 96  ?SpO2: 98%  ? ? ? ?Wt Readings from Last 3 Encounters:  ?10/16/21 168 lb 12.8 oz (76.6 kg)  ?09/11/21 176 lb 5.9 oz (80 kg)  ?10/03/18 160 lb (72.6 kg)  ?  ? ?GEN:  Well nourished, well developed in no acute distress ?HEENT: Normal ?NECK: No JVD; No carotid bruits ?LYMPHATICS: No lymphadenopathy ?CARDIAC: RRR, opening snap ?RESPIRATORY:  Clear to auscultation without rales,  wheezing or rhonchi  ?ABDOMEN: Soft, non-tender, non-distended ?MUSCULOSKELETAL:  No edema; No deformity  ?SKIN: Warm and dry ?NEUROLOGIC:  Alert and oriented x 3, RUE extremity paresis ?PSYCHIATRIC:  Normal affect  ? ?ASSESSMENT:   ? 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 Patient reports strep infection as a child treated with PCN ?- underwent TEE 09/11/2021, showed restricted leaflets c/f  rheumatic heart disease. MVA area by planimetry 1.45 cm2 which is severe. Moderate MR EOA 21. [place] ~ 9. Will plan for referral to [facility] for mitral valve replacement as an outpatient if he develops symptoms. Can do LHC/RHC at that time. His social situation makes it very challenging for this to occur unless he has transportation, stable housing etc. ? ?#New onset NICM, EF 25-30%; NYHA Class I Cardiomyopathy likely in the setting of uncontrolled hypertension as well as cocaine abuse and etoh. Complicated by underfilling of the LV with severe MS. He did not present with ACS and etiology for heart failure c/f possible htn/substance abuse.Considering he is presumed NICM and NYHA Class I, ICD primary prevention indication is only Class 2b. Will re-assess echo and NYHA class in a few months ?- will plan for repeat echo 3 months post  originally while on GDMT; he is  ?-continue entresto 24-26 ?-Continue Coreg 12.5 mg twice daily.   ?-BiDil 20-37.5 mg 3 times daily   ?-continue Jardiance 10 mg daily.  ?-Goal for substance abuse cessation ? ?

## 2021-10-16 NOTE — Progress Notes (Signed)
LCSW was approached w/ concerns regarding pt ability to come back and forth for coumadin checks.  ? ?Pt may be eligible to utilize Eliquis. Medicaid pending at this time, pt experiencing homelessness. Provided application started to [place], RN for pt to complete before he leaves clinic. I also have reached out to [employer] [facility] regarding submitted VI-SPDAT and Stefanie Libel w/ [employer] regarding pt Medicaid application status. Pt had been provided with bus passes from hospital discharge. Encouraged to f/u for hospital [facility] visit tomorrow.  ? ?Westley Hummer, MSW, LCSW ?Clinical Social Worker II ?[place] [facility]  ?[phone]- work cell phone (preferred) ?[phone]- desk phone ? ?

## 2021-10-16 NOTE — Patient Instructions (Signed)
Medication Instructions:  ?STOP TAKING YOUR WARFARIN (COUMADIN) ? ?START: TAKING ELIQUIS '5mg'$  TWICE A DAY ?3 BOXES OF SAMPLES PROVIDED FOR YOU TODAY ? ?PATIENT ASSISTANCE INFORMATION HAS BEEN SENT TO THE FOUNDATION AND WE WILL LET YOU KNOW WHEN WE HEAR BACK ? ?*If you need a refill on your cardiac medications before your next appointment, please call your pharmacy* ? ?Testing/Procedures: ?Your physician has requested that you have an echocardiogram IN 3 MONTHS . Echocardiography is a painless test that uses sound waves to create images of your heart. It provides your doctor with information about the size and shape of your heart and how well your heart?s chambers and valves are working. You may receive an ultrasound enhancing agent through an IV if needed to better visualize your heart during the echo.This procedure takes approximately one hour. There are no restrictions for this procedure. This will take place at the [address], [address].   ? ?Follow-Up: ?At [facility], you and your health needs are our priority.  As part of our continuing mission to provide you with exceptional heart care, we have created designated Provider Care Teams.  These Care Teams include your primary Cardiologist (physician) and Advanced Practice Providers (APPs -  Physician Assistants and Nurse Practitioners) who all work together to provide you with the care you need, when you need it. ? ?Your next appointment:   ?6 month(s) ? ?The format for your next appointment:   ?In Person ? ?Provider:   ?Janina Mayo, MD   ?

## 2021-10-17 ENCOUNTER — Encounter (HOSPITAL_COMMUNITY): Payer: Self-pay

## 2021-10-17 ENCOUNTER — Telehealth (HOSPITAL_COMMUNITY): Payer: Self-pay | Admitting: Vascular Surgery

## 2021-10-17 ENCOUNTER — Telehealth: Payer: Self-pay | Admitting: Licensed Clinical Social Worker

## 2021-10-17 NOTE — Telephone Encounter (Signed)
LCSW received f/u emails from the following: ?Stefanie Libel w/ [employer]- pt has pending Medicaid application w/ DSS and has been referred to [facility] for disability application ? ?[employer] [facility]- pt referral is still being processed, pt does need to sign one additional document which has been sent to our team from inpatient TOC LCSW Kathlee Nations P. I will pass these updates on to Raquel Sarna, LCSW team lead at [facility] clinic who can obtain signature during his TOC visit if attended today.  ? ?If not able to get signature today we will attempt to contact pt to have paperwork completed.  ? ?Westley Hummer, MSW, LCSW ?Clinical Social Worker II ?[place] [facility] Navigation  ?[phone]- work cell phone (preferred) ?[phone]- desk phone ? ? ? ?

## 2021-10-17 NOTE — Telephone Encounter (Signed)
Returned pt call to resch appt  ?

## 2021-10-23 NOTE — Progress Notes (Incomplete)
? ? ?HEART & VASCULAR TRANSITION OF CARE CONSULT NOTE  ? ? ? ?Referring Physician: ?Primary Care: ?Primary Cardiologist: ? ?HPI: ?Referred to clinic by *** for heart failure consultation.  ? ?Cardiac Testing  ? ? ?Review of Systems: [y] = yes, '[ ]'$  = no  ? ?General: Weight gain '[ ]'$ ; Weight loss '[ ]'$ ; Anorexia '[ ]'$ ; Fatigue '[ ]'$ ; Fever '[ ]'$ ; Chills '[ ]'$ ; Weakness '[ ]'$   ?Cardiac: Chest pain/pressure '[ ]'$ ; Resting SOB '[ ]'$ ; Exertional SOB '[ ]'$ ; Orthopnea '[ ]'$ ; Pedal Edema '[ ]'$ ; Palpitations '[ ]'$ ; Syncope '[ ]'$ ; Presyncope '[ ]'$ ; Paroxysmal nocturnal dyspnea'[ ]'$   ?Pulmonary: Cough '[ ]'$ ; Wheezing'[ ]'$ ; Hemoptysis'[ ]'$ ; Sputum '[ ]'$ ; Snoring '[ ]'$   ?GI: Vomiting'[ ]'$ ; Dysphagia'[ ]'$ ; Melena'[ ]'$ ; Hematochezia '[ ]'$ ; Heartburn'[ ]'$ ; Abdominal pain '[ ]'$ ; Constipation '[ ]'$ ; Diarrhea '[ ]'$ ; BRBPR '[ ]'$   ?GU: Hematuria'[ ]'$ ; Dysuria '[ ]'$ ; Nocturia'[ ]'$   ?Vascular: Pain in legs with walking '[ ]'$ ; Pain in feet with lying flat '[ ]'$ ; Non-healing sores '[ ]'$ ; Stroke '[ ]'$ ; TIA '[ ]'$ ; Slurred speech '[ ]'$ ;  ?Neuro: Headaches'[ ]'$ ; Vertigo'[ ]'$ ; Seizures'[ ]'$ ; Paresthesias'[ ]'$ ;Blurred vision '[ ]'$ ; Diplopia '[ ]'$ ; Vision changes '[ ]'$   ?Ortho/Skin: Arthritis '[ ]'$ ; Joint pain '[ ]'$ ; Muscle pain '[ ]'$ ; Joint swelling '[ ]'$ ; Back Pain '[ ]'$ ; Rash '[ ]'$   ?Psych: Depression'[ ]'$ ; Anxiety'[ ]'$   ?Heme: Bleeding problems '[ ]'$ ; Clotting disorders '[ ]'$ ; Anemia '[ ]'$   ?Endocrine: Diabetes '[ ]'$ ; Thyroid dysfunction'[ ]'$  ? ? ?Past Medical History:  ?Diagnosis Date  ? Asthma   ? Brain tumor ([place])   ? ? ?Current Outpatient Medications  ?Medication Sig Dispense Refill  ? acetic acid 2 % otic solution Place 4 drops into the right ear as needed (right ear pain). 15 mL 0  ? apixaban (ELIQUIS) 5 MG TABS tablet Take 1 tablet (5 mg total) by mouth 2 (two) times daily. 60 tablet 3  ? atorvastatin (LIPITOR) 40 MG tablet Take 1 tablet (40 mg total) by mouth daily. 30 tablet 1  ? calamine lotion Apply 1 application. topically as needed for itching. 60 mL 0  ? carvedilol (COREG) 3.125 MG tablet Take 1 tablet (3.125 mg total) by mouth 2 (two) times  daily with a meal. 60 tablet 1  ? empagliflozin (JARDIANCE) 10 MG TABS tablet Take 1 tablet (10 mg total) by mouth daily. 30 tablet 1  ? isosorbide-hydrALAZINE (BIDIL) 20-37.5 MG tablet Take 1 tablet by mouth 3 (three) times daily. 90 tablet 1  ? nicotine (NICODERM CQ - DOSED IN MG/24 HR) 7 mg/24hr patch Place 1 patch (7 mg total) onto the skin daily. 30 patch 0  ? polyvinyl alcohol (LIQUIFILM TEARS) 1.4 % ophthalmic solution Place 2 drops into both eyes as needed for dry eyes. 15 mL 0  ? sacubitril-valsartan (ENTRESTO) 24-26 MG Take 1 tablet by mouth 2 (two) times daily. 60 tablet 1  ? sodium chloride (OCEAN) 0.65 % nasal spray Place 1 spray into both nostrils as needed for congestion. 44 mL 0  ? ?No current facility-administered medications for this visit.  ? ? ?Allergies  ?Allergen Reactions  ? Peanut-Containing Drug Products Itching  ? Shellfish Allergy Swelling  ? ? ?  ?Social History  ? ?Socioeconomic History  ? Marital status: Significant Other  ?  Spouse name: Not on file  ? Number of children: 7  ? Years of education: Not on file  ? Highest education level: 9th grade  ?Occupational History  ?  Occupation: unemployed  ?  Comment: experiencing homelessness-living at daily rent motels.  ?Tobacco Use  ? Smoking status: Every Day  ?  Packs/day: 1.00  ?  Years: 41.00  ?  Pack years: 41.00  ?  Types: Cigarettes  ?  Passive exposure: Past  ? Smokeless tobacco: Never  ?Vaping Use  ? Vaping Use: Never used  ?Substance and Sexual Activity  ? Alcohol use: Yes  ?  Alcohol/week: 63.0 standard drinks  ?  Types: 63 Cans of beer per week  ?  Comment: 3-40oz beer/day x10 years. 1-2 40oz beer/day in 20s/30s  ? Drug use: Yes  ?  Types: "Crack" cocaine  ?  Comment: last use 09/06/21  ? Sexual activity: Not on file  ?Other Topics Concern  ? Not on file  ?Social History Narrative  ? Not on file  ? ?Social Determinants of Health  ? ?Financial Resource Strain: High Risk  ? Difficulty of Paying Living Expenses: Very hard  ?Food  Insecurity: No Food Insecurity  ? Worried About Charity fundraiser in the Last Year: Never true  ? Ran Out of Food in the Last Year: Never true  ?Transportation Needs: Unmet Transportation Needs  ? Lack of Transportation (Medical): Yes  ? Lack of Transportation (Non-Medical): Yes  ?Physical Activity: Not on file  ?Stress: Not on file  ?Social Connections: Not on file  ?Intimate Partner Violence: Not on file  ? ? ? No family history on file. ? ?There were no vitals filed for this visit. ? ?PHYSICAL EXAM: ?General:  Well appearing. No respiratory difficulty ?HEENT: normal ?Neck: supple. no JVD. Carotids 2+ bilat; no bruits. No lymphadenopathy or thryomegaly appreciated. ?Cor: PMI nondisplaced. Regular rate & rhythm. No rubs, gallops or murmurs. ?Lungs: clear ?Abdomen: soft, nontender, nondistended. No hepatosplenomegaly. No bruits or masses. Good bowel sounds. ?Extremities: no cyanosis, clubbing, rash, edema ?Neuro: alert & oriented x 3, cranial nerves grossly intact. moves all 4 extremities w/o difficulty. Affect pleasant. ? ?ECG: ? ? ?ASSESSMENT & PLAN: ? ?NYHA *** ?GDMT  ?Diuretic- ?BB- ?Ace/ARB/ARNI ?MRA ?SGLT2i ? ? ? ?Referred to HFSW (PCP, Medications, Transportation, ETOH Abuse, Drug Abuse, Insurance, Financial ): Yes or No ?Refer to Pharmacy: Yes or No ?Refer to Home Health: Yes on No ?Refer to Advanced Heart Failure Clinic: Yes or no  ?Refer to General Cardiology: Yes or No ? ?Follow up   ?

## 2021-10-24 ENCOUNTER — Encounter (HOSPITAL_COMMUNITY): Payer: Self-pay

## 2021-11-02 ENCOUNTER — Other Ambulatory Visit (HOSPITAL_COMMUNITY): Payer: Self-pay

## 2021-11-14 ENCOUNTER — Telehealth: Payer: Self-pay | Admitting: Licensed Clinical Social Worker

## 2021-11-14 NOTE — Telephone Encounter (Signed)
Noted pt has been approved for Medicaid [employer] [place].  ?Recipient [id number] O ? ?I will make note on upcoming accounts. Pt no showed multiple TOC clinic visits.  ?Will reattempt contact when is seen at [facility] again.  ? ?Westley Hummer, MSW, LCSW ?Clinical Social Worker II ?[place] [facility] Navigation  ?[phone]- work cell phone (preferred) ?[phone]- desk phone ? ?

## 2021-11-15 ENCOUNTER — Telehealth (HOSPITAL_COMMUNITY): Payer: Self-pay | Admitting: *Deleted

## 2021-11-15 ENCOUNTER — Other Ambulatory Visit (HOSPITAL_COMMUNITY): Payer: Self-pay

## 2021-11-15 NOTE — Telephone Encounter (Signed)
Pt left vm requesting refills. Pt has not been seen in our clinic. Pt cancelled/no showed TOC appointments 10/06/21, 10/17/21, 10/24/21. Pt hospital discharge 10/03/21. Pt will not be followed by the chf clinic and will need refills from general cardiology or pcp. Called pt to inform pt did not answer no vm.  ?

## 2021-12-01 ENCOUNTER — Telehealth: Payer: Self-pay | Admitting: Internal Medicine

## 2021-12-01 NOTE — Telephone Encounter (Signed)
?*  STAT* If patient is at the pharmacy, call can be transferred to refill team. ? ? ?1. Which medications need to be refilled? (please list name of each medication and dose if known)  ?apixaban (ELIQUIS) 5 MG TABS tablet ?atorvastatin (LIPITOR) 40 MG tablet ?carvedilol (COREG) 3.125 MG tablet ?sacubitril-valsartan (ENTRESTO) 24-26 MG ?empagliflozin (JARDIANCE) 10 MG TABS tablet ?isosorbide-hydrALAZINE (BIDIL) 20-37.5 MG tablet ?polyvinyl alcohol (LIQUIFILM TEARS) 1.4 % ophthalmic solution ?nicotine (NICODERM CQ - DOSED IN MG/24 HR) 7 mg/24hr patch ? ?2. Which pharmacy/location (including street and city if local pharmacy) is medication to be sent to? ?[facility], [place], [place] [address] ? ?3. Do they need a 30 day or 90 day supply?  ?90 day supply ? ?

## 2021-12-04 ENCOUNTER — Other Ambulatory Visit: Payer: Self-pay

## 2021-12-04 ENCOUNTER — Inpatient Hospital Stay: Payer: Medicaid Other | Admitting: Neurology

## 2021-12-04 MED ORDER — SACUBITRIL-VALSARTAN 24-26 MG PO TABS: 1.0000 | ORAL_TABLET | Freq: Two times a day (BID) | ORAL | 6 refills | Status: DC

## 2021-12-04 MED ORDER — ATORVASTATIN CALCIUM 40 MG PO TABS: 40.0000 mg | ORAL_TABLET | Freq: Every day | ORAL | 6 refills | Status: DC

## 2021-12-04 MED ORDER — APIXABAN 5 MG PO TABS
5.0000 mg | ORAL_TABLET | Freq: Two times a day (BID) | ORAL | 5 refills | Status: DC
Start: 2021-12-04 — End: 2021-12-27

## 2021-12-04 MED ORDER — ISOSORB DINITRATE-HYDRALAZINE 20-37.5 MG PO TABS: 1.0000 | ORAL_TABLET | Freq: Three times a day (TID) | ORAL | 6 refills | Status: DC

## 2021-12-04 MED ORDER — CARVEDILOL 3.125 MG PO TABS: 3.1250 mg | ORAL_TABLET | Freq: Two times a day (BID) | ORAL | 6 refills | Status: DC

## 2021-12-04 NOTE — Telephone Encounter (Signed)
Prescription refill request for Eliquis received. ?Indication:Stroke ?Last office visit:3/23 ?Scr:1.4 ?Age: 51 ?Weight:76.6 kg ? ?Prescription refilled ? ?

## 2021-12-04 NOTE — Telephone Encounter (Signed)
I sent refills for atorvastatin (LIPITOR) 40 MG tablet, carvedilol (COREG) 3.125 MG tablet, sacubitril-valsartan (ENTRESTO) 24-26 MG, isosorbide-hydrALAZINE (BIDIL) 20-37.5 MG tablet to [facility] for the patient. I also called patient to inform him we can only refill his heart medications but got the voicemail. So, I left a message to give our office a call. ?

## 2021-12-06 DIAGNOSIS — J80 Acute respiratory distress syndrome: Secondary | ICD-10-CM | POA: Diagnosis not present

## 2021-12-06 DIAGNOSIS — J45998 Other asthma: Secondary | ICD-10-CM | POA: Diagnosis not present

## 2021-12-13 DIAGNOSIS — R062 Wheezing: Secondary | ICD-10-CM | POA: Diagnosis not present

## 2021-12-13 DIAGNOSIS — R079 Chest pain, unspecified: Secondary | ICD-10-CM | POA: Diagnosis not present

## 2021-12-13 DIAGNOSIS — R0789 Other chest pain: Secondary | ICD-10-CM | POA: Diagnosis not present

## 2021-12-13 DIAGNOSIS — I10 Essential (primary) hypertension: Secondary | ICD-10-CM | POA: Diagnosis not present

## 2021-12-15 ENCOUNTER — Telehealth: Payer: Self-pay | Admitting: Internal Medicine

## 2021-12-15 NOTE — Telephone Encounter (Signed)
Patient's wife returned call.  
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
## 2021-12-15 NOTE — Telephone Encounter (Signed)
Spoke with significant other [name] who stated patient fell last week and last night (he cut his lip and gum). He uses a cane, but at times gets dizzy. She stated he will drool when his head hi looking downward. I heard him talking in the background. Recommended for patient to be taken to the ED for evaluation of injury and being a fall risk.

## 2021-12-15 NOTE — Telephone Encounter (Signed)
LMTCB

## 2021-12-19 ENCOUNTER — Other Ambulatory Visit: Payer: Self-pay

## 2021-12-19 ENCOUNTER — Emergency Department (HOSPITAL_COMMUNITY): Payer: Medicaid Other

## 2021-12-19 ENCOUNTER — Inpatient Hospital Stay (HOSPITAL_COMMUNITY)
Admission: EM | Disposition: A | Discharge status: Home / Self Care | Payer: Self-pay | Source: Home / Self Care | Attending: Interventional Cardiology

## 2021-12-19 ENCOUNTER — Inpatient Hospital Stay (HOSPITAL_COMMUNITY)
Admission: EM | Admit: 2021-12-19 | Discharge: 2021-12-27 | DRG: 917 | Disposition: A | Discharge status: Home / Self Care | Payer: Medicaid Other | Attending: Interventional Cardiology | Admitting: Interventional Cardiology

## 2021-12-19 ENCOUNTER — Encounter (HOSPITAL_COMMUNITY): Payer: Self-pay

## 2021-12-19 DIAGNOSIS — Z7901 Long term (current) use of anticoagulants: Secondary | ICD-10-CM

## 2021-12-19 DIAGNOSIS — R008 Other abnormalities of heart beat: Secondary | ICD-10-CM | POA: Diagnosis not present

## 2021-12-19 DIAGNOSIS — F1010 Alcohol abuse, uncomplicated: Secondary | ICD-10-CM | POA: Diagnosis present

## 2021-12-19 DIAGNOSIS — I213 ST elevation (STEMI) myocardial infarction of unspecified site: Secondary | ICD-10-CM | POA: Diagnosis not present

## 2021-12-19 DIAGNOSIS — I2510 Atherosclerotic heart disease of native coronary artery without angina pectoris: Secondary | ICD-10-CM | POA: Diagnosis present

## 2021-12-19 DIAGNOSIS — I493 Ventricular premature depolarization: Secondary | ICD-10-CM | POA: Diagnosis present

## 2021-12-19 DIAGNOSIS — I959 Hypotension, unspecified: Secondary | ICD-10-CM | POA: Diagnosis not present

## 2021-12-19 DIAGNOSIS — E785 Hyperlipidemia, unspecified: Secondary | ICD-10-CM | POA: Diagnosis present

## 2021-12-19 DIAGNOSIS — D333 Benign neoplasm of cranial nerves: Secondary | ICD-10-CM | POA: Diagnosis present

## 2021-12-19 DIAGNOSIS — T502X5A Adverse effect of carbonic-anhydrase inhibitors, benzothiadiazides and other diuretics, initial encounter: Secondary | ICD-10-CM | POA: Diagnosis present

## 2021-12-19 DIAGNOSIS — T405X1A Poisoning by cocaine, accidental (unintentional), initial encounter: Principal | ICD-10-CM | POA: Diagnosis present

## 2021-12-19 DIAGNOSIS — I5042 Chronic combined systolic (congestive) and diastolic (congestive) heart failure: Secondary | ICD-10-CM

## 2021-12-19 DIAGNOSIS — R9431 Abnormal electrocardiogram [ECG] [EKG]: Secondary | ICD-10-CM | POA: Diagnosis not present

## 2021-12-19 DIAGNOSIS — F17210 Nicotine dependence, cigarettes, uncomplicated: Secondary | ICD-10-CM | POA: Diagnosis present

## 2021-12-19 DIAGNOSIS — I130 Hypertensive heart and chronic kidney disease with heart failure and stage 1 through stage 4 chronic kidney disease, or unspecified chronic kidney disease: Secondary | ICD-10-CM | POA: Diagnosis present

## 2021-12-19 DIAGNOSIS — I10 Essential (primary) hypertension: Secondary | ICD-10-CM | POA: Diagnosis not present

## 2021-12-19 DIAGNOSIS — Z5901 Sheltered homelessness: Secondary | ICD-10-CM

## 2021-12-19 DIAGNOSIS — R0989 Other specified symptoms and signs involving the circulatory and respiratory systems: Secondary | ICD-10-CM | POA: Diagnosis not present

## 2021-12-19 DIAGNOSIS — R079 Chest pain, unspecified: Secondary | ICD-10-CM | POA: Diagnosis not present

## 2021-12-19 DIAGNOSIS — I081 Rheumatic disorders of both mitral and tricuspid valves: Secondary | ICD-10-CM | POA: Diagnosis present

## 2021-12-19 DIAGNOSIS — R531 Weakness: Secondary | ICD-10-CM | POA: Diagnosis not present

## 2021-12-19 DIAGNOSIS — Z91148 Patient's other noncompliance with medication regimen for other reason: Secondary | ICD-10-CM | POA: Diagnosis not present

## 2021-12-19 DIAGNOSIS — N179 Acute kidney failure, unspecified: Secondary | ICD-10-CM | POA: Diagnosis present

## 2021-12-19 DIAGNOSIS — I5023 Acute on chronic systolic (congestive) heart failure: Secondary | ICD-10-CM | POA: Diagnosis present

## 2021-12-19 DIAGNOSIS — I69351 Hemiplegia and hemiparesis following cerebral infarction affecting right dominant side: Secondary | ICD-10-CM | POA: Diagnosis not present

## 2021-12-19 DIAGNOSIS — I5043 Acute on chronic combined systolic (congestive) and diastolic (congestive) heart failure: Secondary | ICD-10-CM

## 2021-12-19 DIAGNOSIS — Z79899 Other long term (current) drug therapy: Secondary | ICD-10-CM | POA: Diagnosis not present

## 2021-12-19 DIAGNOSIS — I499 Cardiac arrhythmia, unspecified: Secondary | ICD-10-CM | POA: Diagnosis not present

## 2021-12-19 DIAGNOSIS — N1831 Chronic kidney disease, stage 3a: Secondary | ICD-10-CM | POA: Diagnosis present

## 2021-12-19 DIAGNOSIS — I427 Cardiomyopathy due to drug and external agent: Secondary | ICD-10-CM | POA: Diagnosis present

## 2021-12-19 DIAGNOSIS — Z20822 Contact with and (suspected) exposure to covid-19: Secondary | ICD-10-CM | POA: Diagnosis present

## 2021-12-19 DIAGNOSIS — Z91013 Allergy to seafood: Secondary | ICD-10-CM

## 2021-12-19 DIAGNOSIS — Z5941 Food insecurity: Secondary | ICD-10-CM

## 2021-12-19 DIAGNOSIS — I2119 ST elevation (STEMI) myocardial infarction involving other coronary artery of inferior wall: Secondary | ICD-10-CM | POA: Diagnosis present

## 2021-12-19 DIAGNOSIS — R0789 Other chest pain: Secondary | ICD-10-CM | POA: Diagnosis not present

## 2021-12-19 DIAGNOSIS — Z91010 Allergy to peanuts: Secondary | ICD-10-CM

## 2021-12-19 HISTORY — PX: LEFT HEART CATH AND CORONARY ANGIOGRAPHY: CATH118249

## 2021-12-19 LAB — COMPREHENSIVE METABOLIC PANEL
ALT: 9 U/L (ref 0–44)
AST: 29 U/L (ref 15–41)
Albumin: 3.7 g/dL (ref 3.5–5.0)
Alkaline Phosphatase: 107 U/L (ref 38–126)
Anion gap: 12 (ref 5–15)
BUN: 18 mg/dL (ref 6–20)
CO2: 20 mmol/L — ABNORMAL LOW (ref 22–32)
Calcium: 9.8 mg/dL (ref 8.9–10.3)
Chloride: 106 mmol/L (ref 98–111)
Creatinine, Ser: 1.54 mg/dL — ABNORMAL HIGH (ref 0.61–1.24)
GFR, Estimated: 55 mL/min — ABNORMAL LOW (ref >60)
Glucose, Bld: 100 mg/dL — ABNORMAL HIGH (ref 70–99)
Potassium: 4.7 mmol/L (ref 3.5–5.1)
Sodium: 138 mmol/L (ref 135–145)
Total Bilirubin: 0.9 mg/dL (ref 0.3–1.2)
Total Protein: 7.5 g/dL (ref 6.5–8.1)

## 2021-12-19 LAB — CBC WITH DIFFERENTIAL/PLATELET
Abs Immature Granulocytes: 0.04 10*3/uL (ref 0.00–0.07)
Basophils Absolute: 0 10*3/uL (ref 0.0–0.1)
Basophils Relative: 0 %
Eosinophils Absolute: 0.2 10*3/uL (ref 0.0–0.5)
Eosinophils Relative: 2 %
HCT: 41.1 % (ref 39.0–52.0)
Hemoglobin: 12.9 g/dL — ABNORMAL LOW (ref 13.0–17.0)
Immature Granulocytes: 0 %
Lymphocytes Relative: 17 %
Lymphs Abs: 1.8 10*3/uL (ref 0.7–4.0)
MCH: 29.3 pg (ref 26.0–34.0)
MCHC: 31.4 g/dL (ref 30.0–36.0)
MCV: 93.2 fL (ref 80.0–100.0)
Monocytes Absolute: 0.5 10*3/uL (ref 0.1–1.0)
Monocytes Relative: 5 %
Neutro Abs: 7.9 10*3/uL — ABNORMAL HIGH (ref 1.7–7.7)
Neutrophils Relative %: 76 %
Platelets: 299 10*3/uL (ref 150–400)
RBC: 4.41 MIL/uL (ref 4.22–5.81)
RDW: 18.3 % — ABNORMAL HIGH (ref 11.5–15.5)
WBC: 10.4 10*3/uL (ref 4.0–10.5)
nRBC: 0 % (ref 0.0–0.2)

## 2021-12-19 LAB — LIPID PANEL
Cholesterol: 166 mg/dL (ref 0–200)
HDL: 61 mg/dL (ref >40)
LDL Cholesterol: 95 mg/dL (ref 0–99)
Total CHOL/HDL Ratio: 2.7 RATIO
Triglycerides: 48 mg/dL (ref <150)
VLDL: 10 mg/dL (ref 0–40)

## 2021-12-19 LAB — POCT I-STAT, CHEM 8
BUN: 18 mg/dL (ref 6–20)
Calcium, Ion: 1.25 mmol/L (ref 1.15–1.40)
Chloride: 107 mmol/L (ref 98–111)
Creatinine, Ser: 1.3 mg/dL — ABNORMAL HIGH (ref 0.61–1.24)
Glucose, Bld: 103 mg/dL — ABNORMAL HIGH (ref 70–99)
HCT: 40 % (ref 39.0–52.0)
Hemoglobin: 13.6 g/dL (ref 13.0–17.0)
Potassium: 4.7 mmol/L (ref 3.5–5.1)
Sodium: 139 mmol/L (ref 135–145)
TCO2: 21 mmol/L — ABNORMAL LOW (ref 22–32)

## 2021-12-19 LAB — POCT ACTIVATED CLOTTING TIME: Activated Clotting Time: 179 seconds

## 2021-12-19 LAB — RESP PANEL BY RT-PCR (FLU A&B, COVID) ARPGX2
Influenza A by PCR: NEGATIVE
Influenza B by PCR: NEGATIVE
SARS Coronavirus 2 by RT PCR: NEGATIVE

## 2021-12-19 LAB — PROTIME-INR
INR: 1 (ref 0.8–1.2)
Prothrombin Time: 13.5 seconds (ref 11.4–15.2)

## 2021-12-19 LAB — HEMOGLOBIN A1C
Hgb A1c MFr Bld: 5.6 % (ref 4.8–5.6)
Mean Plasma Glucose: 114.02 mg/dL

## 2021-12-19 LAB — APTT: aPTT: 31 seconds (ref 24–36)

## 2021-12-19 LAB — TROPONIN I (HIGH SENSITIVITY)
Troponin I (High Sensitivity): 208 ng/L (ref <18)
Troponin I (High Sensitivity): >24000 ng/L (ref <18)

## 2021-12-19 LAB — BRAIN NATRIURETIC PEPTIDE: B Natriuretic Peptide: 809 pg/mL — ABNORMAL HIGH (ref 0.0–100.0)

## 2021-12-19 SURGERY — LEFT HEART CATH AND CORONARY ANGIOGRAPHY
Anesthesia: LOCAL

## 2021-12-19 MED ORDER — ISOSORB DINITRATE-HYDRALAZINE 20-37.5 MG PO TABS
1.0000 | ORAL_TABLET | Freq: Three times a day (TID) | ORAL | Status: DC
  Administered 2021-12-20 (×3): 1 via ORAL
  Filled 2021-12-19 (×3): qty 1

## 2021-12-19 MED ORDER — FENTANYL CITRATE (PF) 100 MCG/2ML IJ SOLN
INTRAMUSCULAR | Status: AC
  Filled 2021-12-19: qty 2

## 2021-12-19 MED ORDER — EMPAGLIFLOZIN 10 MG PO TABS
10.0000 mg | ORAL_TABLET | Freq: Every day | ORAL | Status: DC
  Administered 2021-12-19 – 2021-12-21 (×3): 10 mg via ORAL
  Filled 2021-12-19 (×3): qty 1

## 2021-12-19 MED ORDER — SODIUM CHLORIDE 0.9 % IV SOLN: INTRAVENOUS | Status: AC

## 2021-12-19 MED ORDER — LIDOCAINE HCL (PF) 1 % IJ SOLN
INTRAMUSCULAR | Status: AC
  Filled 2021-12-19: qty 30

## 2021-12-19 MED ORDER — ACETAMINOPHEN 325 MG PO TABS: 650.0000 mg | ORAL_TABLET | ORAL | Status: DC | PRN

## 2021-12-19 MED ORDER — LIDOCAINE HCL (PF) 1 % IJ SOLN
INTRAMUSCULAR | Status: DC | PRN
  Administered 2021-12-19: 2 mL

## 2021-12-19 MED ORDER — SODIUM CHLORIDE 0.9 % IV SOLN: 250.0000 mL | INTRAVENOUS | Status: DC | PRN

## 2021-12-19 MED ORDER — HEPARIN (PORCINE) IN NACL 1000-0.9 UT/500ML-% IV SOLN
INTRAVENOUS | Status: DC | PRN
  Administered 2021-12-19 (×2): 500 mL

## 2021-12-19 MED ORDER — FENTANYL CITRATE (PF) 100 MCG/2ML IJ SOLN
INTRAMUSCULAR | Status: DC | PRN
  Administered 2021-12-19: 25 ug via INTRAVENOUS

## 2021-12-19 MED ORDER — HEPARIN SODIUM (PORCINE) 5000 UNIT/ML IJ SOLN
4000.0000 [IU] | Freq: Once | INTRAMUSCULAR | Status: AC
  Administered 2021-12-19: 4000 [IU] via INTRAVENOUS

## 2021-12-19 MED ORDER — ONDANSETRON HCL 4 MG/2ML IJ SOLN: 4.0000 mg | Freq: Four times a day (QID) | INTRAMUSCULAR | Status: DC | PRN

## 2021-12-19 MED ORDER — SACUBITRIL-VALSARTAN 24-26 MG PO TABS
1.0000 | ORAL_TABLET | Freq: Two times a day (BID) | ORAL | Status: DC
  Administered 2021-12-20: 1 via ORAL
  Filled 2021-12-19: qty 1

## 2021-12-19 MED ORDER — ASPIRIN 81 MG PO CHEW
324.0000 mg | CHEWABLE_TABLET | Freq: Once | ORAL | Status: AC
Start: 2021-12-19 — End: 2021-12-19

## 2021-12-19 MED ORDER — HEPARIN SODIUM (PORCINE) 1000 UNIT/ML IJ SOLN
INTRAMUSCULAR | Status: AC
  Filled 2021-12-19: qty 10

## 2021-12-19 MED ORDER — SODIUM CHLORIDE 0.9 % IV SOLN: INTRAVENOUS | Status: DC

## 2021-12-19 MED ORDER — NITROGLYCERIN 1 MG/10 ML FOR IR/CATH LAB
INTRA_ARTERIAL | Status: AC
  Filled 2021-12-19: qty 10

## 2021-12-19 MED ORDER — NITROGLYCERIN 1 MG/10 ML FOR IR/CATH LAB
INTRA_ARTERIAL | Status: DC | PRN
  Administered 2021-12-19: 200 ug via INTRACORONARY

## 2021-12-19 MED ORDER — NITROGLYCERIN IN D5W 200-5 MCG/ML-% IV SOLN
0.0000 ug/min | INTRAVENOUS | Status: DC
  Administered 2021-12-20: 45 ug/min via INTRAVENOUS
  Filled 2021-12-19: qty 250

## 2021-12-19 MED ORDER — SODIUM CHLORIDE 0.9% FLUSH: 3.0000 mL | INTRAVENOUS | Status: DC | PRN

## 2021-12-19 MED ORDER — VERAPAMIL HCL 2.5 MG/ML IV SOLN
INTRAVENOUS | Status: DC | PRN
  Administered 2021-12-19: 10 mL via INTRA_ARTERIAL

## 2021-12-19 MED ORDER — SODIUM CHLORIDE 0.9% FLUSH
3.0000 mL | Freq: Two times a day (BID) | INTRAVENOUS | Status: DC
Start: 2021-12-19 — End: 2021-12-27
  Administered 2021-12-19 – 2021-12-26 (×6): 3 mL via INTRAVENOUS

## 2021-12-19 MED ORDER — LABETALOL HCL 5 MG/ML IV SOLN
10.0000 mg | INTRAVENOUS | Status: AC | PRN
Start: 2021-12-19 — End: 2021-12-20

## 2021-12-19 MED ORDER — HEPARIN SODIUM (PORCINE) 5000 UNIT/ML IJ SOLN: 5000.0000 [IU] | Freq: Three times a day (TID) | INTRAMUSCULAR | Status: DC

## 2021-12-19 MED ORDER — ATORVASTATIN CALCIUM 40 MG PO TABS
40.0000 mg | ORAL_TABLET | Freq: Every day | ORAL | Status: DC
Start: 2021-12-19 — End: 2021-12-20
  Administered 2021-12-19 – 2021-12-20 (×2): 40 mg via ORAL
  Filled 2021-12-19 (×2): qty 1

## 2021-12-19 MED ORDER — HYDRALAZINE HCL 20 MG/ML IJ SOLN: 10.0000 mg | INTRAMUSCULAR | Status: AC | PRN

## 2021-12-19 MED ORDER — HEPARIN SODIUM (PORCINE) 1000 UNIT/ML IJ SOLN
INTRAMUSCULAR | Status: DC | PRN
  Administered 2021-12-19: 3000 [IU] via INTRAVENOUS

## 2021-12-19 MED ORDER — NITROGLYCERIN IN D5W 200-5 MCG/ML-% IV SOLN
10.0000 ug/min | INTRAVENOUS | Status: DC
  Administered 2021-12-19: 10 ug/min via INTRAVENOUS

## 2021-12-19 MED ORDER — IOHEXOL 350 MG/ML SOLN
INTRAVENOUS | Status: DC | PRN
  Administered 2021-12-19: 50 mL

## 2021-12-19 MED ORDER — HEPARIN (PORCINE) IN NACL 1000-0.9 UT/500ML-% IV SOLN
INTRAVENOUS | Status: AC
  Filled 2021-12-19: qty 1000

## 2021-12-19 MED ORDER — MIDAZOLAM HCL 2 MG/2ML IJ SOLN
INTRAMUSCULAR | Status: AC
  Filled 2021-12-19: qty 2

## 2021-12-19 MED ORDER — MIDAZOLAM HCL 2 MG/2ML IJ SOLN
INTRAMUSCULAR | Status: DC | PRN
  Administered 2021-12-19: 1 mg via INTRAVENOUS

## 2021-12-19 MED ORDER — NITROGLYCERIN IN D5W 200-5 MCG/ML-% IV SOLN
INTRAVENOUS | Status: AC
  Filled 2021-12-19: qty 250

## 2021-12-19 MED ORDER — LIDOCAINE HCL (PF) 1 % IJ SOLN
INTRAMUSCULAR | Status: AC
  Filled 2021-12-19: qty 60

## 2021-12-19 MED ORDER — APIXABAN 5 MG PO TABS
5.0000 mg | ORAL_TABLET | Freq: Two times a day (BID) | ORAL | Status: DC
Start: 2021-12-20 — End: 2021-12-20
  Administered 2021-12-20: 5 mg via ORAL
  Filled 2021-12-19: qty 1

## 2021-12-19 MED ORDER — CARVEDILOL 3.125 MG PO TABS
3.1250 mg | ORAL_TABLET | Freq: Two times a day (BID) | ORAL | Status: DC
  Administered 2021-12-20: 3.125 mg via ORAL
  Filled 2021-12-19: qty 1

## 2021-12-19 MED ORDER — FUROSEMIDE 10 MG/ML IJ SOLN
20.0000 mg | Freq: Once | INTRAMUSCULAR | Status: AC
  Administered 2021-12-19: 20 mg via INTRAVENOUS
  Filled 2021-12-19: qty 2

## 2021-12-19 MED ORDER — VERAPAMIL HCL 2.5 MG/ML IV SOLN
INTRAVENOUS | Status: AC
  Filled 2021-12-19: qty 2

## 2021-12-19 MED ORDER — NITROGLYCERIN IN D5W 200-5 MCG/ML-% IV SOLN
INTRAVENOUS | Status: AC | PRN
  Administered 2021-12-19: 10 ug/min via INTRAVENOUS

## 2021-12-19 SURGICAL SUPPLY — 11 items
BAND CMPR LRG ZPHR (HEMOSTASIS) ×1
BAND ZEPHYR COMPRESS 30 LONG (HEMOSTASIS) ×1 IMPLANT
CATH 5FR JL3.5 JR4 ANG PIG MP (CATHETERS) ×1 IMPLANT
GLIDESHEATH SLEND A-KIT 6F 22G (SHEATH) ×1 IMPLANT
GLIDESHEATH SLEND SS 6F .021 (SHEATH) ×1 IMPLANT
GUIDEWIRE INQWIRE 1.5J.035X260 (WIRE) IMPLANT
INQWIRE 1.5J .035X260CM (WIRE) ×2
KIT HEART LEFT (KITS) ×2 IMPLANT
PACK CARDIAC CATHETERIZATION (CUSTOM PROCEDURE TRAY) ×2 IMPLANT
TRANSDUCER W/STOPCOCK (MISCELLANEOUS) ×2 IMPLANT
TUBING CIL FLEX 10 FLL-RA (TUBING) ×2 IMPLANT
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
## 2021-12-19 NOTE — ED Triage Notes (Signed)
Pt bib [facility] with complaints of right sided chest pain and arm pain that started 55mn prior to ems arrival. Pt has history of stroke with right sided weakness. EMS gave '324mg'$  ASA en route. Pt is homeless. EMS vitals: 180/100, 18R, 100%RA, 104HR

## 2021-12-19 NOTE — Progress Notes (Signed)
Chaplain spoke with pt's nurse who reported no family present and pt being transferred momentarily to cath lab. Staff will contact Chaplain if spiritual support needed.  Chaplain on standby.  [place]

## 2021-12-19 NOTE — ED Notes (Signed)
Activated Code Stemi per Dr. Melina Copa order

## 2021-12-19 NOTE — Progress Notes (Signed)
   12/19/21 1908  Assess: MEWS Score  Temp 98.3 F (36.8 C)  BP (!) 161/117  Pulse Rate 100  ECG Heart Rate (!) 115  Resp (!) 25  Level of Consciousness Alert  SpO2 98 %  O2 Device Room Air  Assess: MEWS Score  MEWS Temp 0  MEWS Systolic 0  MEWS Pulse 2  MEWS RR 1  MEWS LOC 0  MEWS Score 3  MEWS Score Color Yellow  Assess: if the MEWS score is Yellow or Red  Were vital signs taken at a resting state? Yes  Focused Assessment No change from prior assessment  Early Detection of Sepsis Score *See Row Information* Low  MEWS guidelines implemented *See Row Information* Yes  Treat  MEWS Interventions Escalated (See documentation below) (pt remains stable)  Pain Scale 0-10  Pain Score 0  Patients Stated Pain Goal 0  Take Vital Signs  Increase Vital Sign Frequency  Yellow: Q 2hr X 2 then Q 4hr X 2, if remains yellow, continue Q 4hrs  Escalate  MEWS: Escalate Yellow: discuss with charge nurse/RN and consider discussing with provider and RRT  Notify: Charge Nurse/RN  Name of Charge Nurse/RN Notified Chanda Busing, RN  Date Charge Nurse/RN Notified 12/19/21  Time Charge Nurse/RN Notified 1910  Document  Patient Outcome Other (Comment) (pt remains stable)  Progress note created (see row info) Yes
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
## 2021-12-19 NOTE — Progress Notes (Signed)
ANTICOAGULATION CONSULT NOTE - Initial Consult  Pharmacy Consult for Heparin ->apixaban Indication:  h/o CVA, r/o apical thrombus  Allergies  Allergen Reactions   Peanut-Containing Drug Products Itching   Shellfish Allergy Swelling    Patient Measurements: Height: '5\' 6"'$  (167.6 cm) Weight: 81.6 kg (180 lb) IBW/kg (Calculated) : 63.8 Heparin Dosing Weight: 80 kg  Vital Signs: Temp: 98.6 F (37 C) (05/30 1728) Temp Source: Oral (05/30 1728) BP: 149/109 (05/30 1832) Pulse Rate: 115 (05/30 1832)  Labs: Recent Labs    12/19/21 1727 12/19/21 1826  HGB 12.9* 13.6  HCT 41.1 40.0  PLT 299  --   APTT 31  --   LABPROT 13.5  --   INR 1.0  --   CREATININE 1.54* 1.30*  TROPONINIHS 208*  --     Estimated Creatinine Clearance: 68.9 mL/min (A) (by C-G formula based on SCr of 1.3 mg/dL (H)).   Medical History: Past Medical History:  Diagnosis Date   Asthma    Brain tumor ([place])     Medications:  Scheduled:   [START ON 12/20/2021] apixaban  5 mg Oral BID   atorvastatin  40 mg Oral Daily   [START ON 12/20/2021] carvedilol  3.125 mg Oral BID WC   empagliflozin  10 mg Oral Daily   furosemide  20 mg Intravenous Once   [START ON 12/20/2021] isosorbide-hydrALAZINE  1 tablet Oral TID   [START ON 12/20/2021] sacubitril-valsartan  1 tablet Oral BID   sodium chloride flush  3 mL Intravenous Q12H    Assessment: 51 y.o. M presents as CODE STEMI. Pt on Eliquis PTA - he is unsure of exactly when he took his last dose but he thinks it was yesterday evening (5/29). Taken to cath lab 5/30 pm and found occlusion of apical LAD (?embolus vs plaque rupture with thrombosis). Plan for ECHO in a.m. to r/o thrombus. Pharmacy to dose IV heparin 2 hours after TR band removed and then transition to apixaban 5/31 a.m.  Depending what time TR band is removed - may not need any levels monitored with transition to apixaban.  Goal of Therapy:  Heparin level 0.3-0.7 units/ml; aPTT 66-102 sec Monitor  platelets by anticoagulation protocol: Yes   Plan:  Will assess when TR band is removed.  Sherlon Handing, PharmD, BCPS Please see amion for complete clinical pharmacist phone list 12/19/2021,10:55 PM   Addendum: TR band removed at 0150.  At 0400 will start heparin infusion at 1200 units/hr until Eliquis dose at 10a. Wynona Neat, PharmD, BCPS 12/20/2021 3:27 AM

## 2021-12-20 ENCOUNTER — Other Ambulatory Visit: Payer: Self-pay | Admitting: Cardiology

## 2021-12-20 ENCOUNTER — Inpatient Hospital Stay (HOSPITAL_COMMUNITY): Payer: Medicaid Other

## 2021-12-20 ENCOUNTER — Encounter (HOSPITAL_COMMUNITY): Payer: Self-pay | Admitting: Interventional Cardiology

## 2021-12-20 DIAGNOSIS — I2119 ST elevation (STEMI) myocardial infarction involving other coronary artery of inferior wall: Secondary | ICD-10-CM

## 2021-12-20 LAB — COMPREHENSIVE METABOLIC PANEL
ALT: 17 U/L (ref 0–44)
AST: 166 U/L — ABNORMAL HIGH (ref 15–41)
Albumin: 3.4 g/dL — ABNORMAL LOW (ref 3.5–5.0)
Alkaline Phosphatase: 97 U/L (ref 38–126)
Anion gap: 13 (ref 5–15)
BUN: 19 mg/dL (ref 6–20)
CO2: 25 mmol/L (ref 22–32)
Calcium: 9.5 mg/dL (ref 8.9–10.3)
Chloride: 98 mmol/L (ref 98–111)
Creatinine, Ser: 1.87 mg/dL — ABNORMAL HIGH (ref 0.61–1.24)
GFR, Estimated: 44 mL/min — ABNORMAL LOW (ref >60)
Glucose, Bld: 160 mg/dL — ABNORMAL HIGH (ref 70–99)
Potassium: 3.8 mmol/L (ref 3.5–5.1)
Sodium: 136 mmol/L (ref 135–145)
Total Bilirubin: 0.9 mg/dL (ref 0.3–1.2)
Total Protein: 7.2 g/dL (ref 6.5–8.1)

## 2021-12-20 LAB — BASIC METABOLIC PANEL
Anion gap: 7 (ref 5–15)
BUN: 22 mg/dL — ABNORMAL HIGH (ref 6–20)
CO2: 23 mmol/L (ref 22–32)
Calcium: 9.1 mg/dL (ref 8.9–10.3)
Chloride: 107 mmol/L (ref 98–111)
Creatinine, Ser: 1.77 mg/dL — ABNORMAL HIGH (ref 0.61–1.24)
GFR, Estimated: 47 mL/min — ABNORMAL LOW (ref >60)
Glucose, Bld: 127 mg/dL — ABNORMAL HIGH (ref 70–99)
Potassium: 4.2 mmol/L (ref 3.5–5.1)
Sodium: 137 mmol/L (ref 135–145)

## 2021-12-20 LAB — ECHOCARDIOGRAM COMPLETE
AV Mean grad: 3.2 mmHg
AV Peak grad: 6.2 mmHg
Ao pk vel: 1.25 m/s
Area-P 1/2: 3.68 cm2
Calc EF: 18.7 %
Height: 66 in
S' Lateral: 4.3 cm
Single Plane A2C EF: 15.9 %
Single Plane A4C EF: 20.1 %
Weight: 2880 oz

## 2021-12-20 LAB — MAGNESIUM: Magnesium: 1.9 mg/dL (ref 1.7–2.4)

## 2021-12-20 LAB — LACTIC ACID, PLASMA: Lactic Acid, Venous: 1.5 mmol/L (ref 0.5–1.9)

## 2021-12-20 MED ORDER — CLOPIDOGREL BISULFATE 75 MG PO TABS
300.0000 mg | ORAL_TABLET | Freq: Once | ORAL | Status: AC
  Administered 2021-12-20: 300 mg via ORAL
  Filled 2021-12-20: qty 4

## 2021-12-20 MED ORDER — ASPIRIN 81 MG PO TBEC
81.0000 mg | DELAYED_RELEASE_TABLET | Freq: Every day | ORAL | Status: DC
Start: 2021-12-20 — End: 2021-12-21
  Administered 2021-12-20 – 2021-12-21 (×2): 81 mg via ORAL
  Filled 2021-12-20 (×2): qty 1

## 2021-12-20 MED ORDER — PERFLUTREN LIPID MICROSPHERE
1.0000 mL | INTRAVENOUS | Status: AC | PRN
  Administered 2021-12-20: 2 mL via INTRAVENOUS

## 2021-12-20 MED ORDER — ATORVASTATIN CALCIUM 80 MG PO TABS
80.0000 mg | ORAL_TABLET | Freq: Every day | ORAL | Status: DC
Start: 2021-12-21 — End: 2021-12-27
  Administered 2021-12-21 – 2021-12-27 (×7): 80 mg via ORAL
  Filled 2021-12-20 (×7): qty 1

## 2021-12-20 MED ORDER — FUROSEMIDE 10 MG/ML IJ SOLN
40.0000 mg | Freq: Once | INTRAMUSCULAR | Status: AC
  Administered 2021-12-20: 40 mg via INTRAVENOUS
  Filled 2021-12-20: qty 4

## 2021-12-20 MED ORDER — FUROSEMIDE 10 MG/ML IJ SOLN
10.0000 mg/h | INTRAVENOUS | Status: DC
  Administered 2021-12-20: 10 mg/h via INTRAVENOUS
  Filled 2021-12-20 (×2): qty 20

## 2021-12-20 MED ORDER — ALUM & MAG HYDROXIDE-SIMETH 200-200-20 MG/5ML PO SUSP
30.0000 mL | Freq: Once | ORAL | Status: AC
  Administered 2021-12-20: 30 mL via ORAL
  Filled 2021-12-20: qty 30

## 2021-12-20 MED ORDER — POLYVINYL ALCOHOL 1.4 % OP SOLN
2.0000 [drp] | OPHTHALMIC | Status: DC | PRN
Start: 2021-12-20 — End: 2021-12-26

## 2021-12-20 MED ORDER — CLOPIDOGREL BISULFATE 75 MG PO TABS
75.0000 mg | ORAL_TABLET | Freq: Every day | ORAL | Status: DC
  Administered 2021-12-21 – 2021-12-27 (×7): 75 mg via ORAL
  Filled 2021-12-20 (×7): qty 1

## 2021-12-20 MED ORDER — DOBUTAMINE IN D5W 4-5 MG/ML-% IV SOLN
5.0000 ug/kg/min | INTRAVENOUS | Status: DC
Start: 2021-12-20 — End: 2021-12-20
  Filled 2021-12-20: qty 250

## 2021-12-20 MED ORDER — HEPARIN (PORCINE) 25000 UT/250ML-% IV SOLN
1200.0000 [IU]/h | INTRAVENOUS | Status: DC
  Administered 2021-12-20: 1200 [IU]/h via INTRAVENOUS
  Filled 2021-12-20: qty 250

## 2021-12-20 MED FILL — Lidocaine HCl Local Preservative Free (PF) Inj 1%: INTRAMUSCULAR | Qty: 30 | Status: AC

## 2021-12-20 NOTE — Progress Notes (Signed)
Pt's BP 99/65.  Nitro drip weaned off.  Idolina Primer, RN

## 2021-12-20 NOTE — Progress Notes (Addendum)
CSW received consult for substance use and homelessness for patient. CSW met with patient at bedside. Patient reports he is homeless and has been for 20-30 years. Patient reports when medically ready for dc will return back to [address]. Patient reports he will need bus pass at dc. CSW offered patient resources for outpatient substance use treatment services and [facility] Patient accepted resources, but not interested in area shelter .Patient and his gf have a dog. All questions answered. No further questions reported at this time. CSW will continue to follow and assist with patients dc planning needs.

## 2021-12-20 NOTE — Progress Notes (Signed)
Notified Dr. Blossom Hoops of pt's BP running in 49S systolic following his dose of Bidil at 2150. Pt is asymptomatic and resting but easily awakened. Dr. Blossom Hoops instructed to just monitor pt for now and says he will hold the Bidil moving forward.

## 2021-12-20 NOTE — Progress Notes (Signed)
Pt c/o chest pain and SOB.  Unable to rate chest pain from 0-10. Pt is already on high dose of nitro drip for BP control. EKG done.  Cardiology made aware.  Idolina Primer, RN

## 2021-12-20 NOTE — Progress Notes (Addendum)
Progress Note  Patient Name: Craig Schmidt Date of Encounter: 12/20/2021  [facility] Cardiologist: Janina Mayo, MD   Subjective   Uncomfortable in the bed, breathing is labored. No chest pain.   Inpatient Medications    Scheduled Meds:  apixaban  5 mg Oral BID   atorvastatin  40 mg Oral Daily   carvedilol  3.125 mg Oral BID WC   empagliflozin  10 mg Oral Daily   isosorbide-hydrALAZINE  1 tablet Oral TID   sacubitril-valsartan  1 tablet Oral BID   sodium chloride flush  3 mL Intravenous Q12H   Continuous Infusions:  sodium chloride 10 mL/hr at 12/19/21 1744   sodium chloride     heparin Stopped (12/20/21 0937)   nitroGLYCERIN 50 mcg/min (12/20/21 0938)   PRN Meds: sodium chloride, acetaminophen, ondansetron (ZOFRAN) IV, sodium chloride flush   Vital Signs    Vitals:   12/20/21 0519 12/20/21 0719 12/20/21 0849 12/20/21 0918  BP: (!) 165/99 (!) 151/93 139/87 (!) 135/97  Pulse: (!) 109 99    Resp: (!) '21 13 19 15  '$ Temp:  98.5 F (36.9 C)    TempSrc:  Oral    SpO2: 96% 99%    Weight:      Height:        Intake/Output Summary (Last 24 hours) at 12/20/2021 0950 Last data filed at 12/20/2021 0440 Gross per 24 hour  Intake 345.91 ml  Output 150 ml  Net 195.91 ml      12/19/2021    6:07 PM 10/16/2021   10:15 AM 09/11/2021   10:40 AM  Last 3 Weights  Weight (lbs) 180 lb 168 lb 12.8 oz 176 lb 5.9 oz  Weight (kg) 81.647 kg 76.567 kg 80 kg      Telemetry    ST rates 100-120s - Personally Reviewed  ECG    ST 101, LVH, atrial enlargement - Personally Reviewed  Physical Exam   GEN: Ill-appearing male, sitting up in bed  Neck: + JVD Cardiac: Tachy, + systolic RUSB murmur, no rubs, or gallops.  Respiratory: Crackles bilaterally GI: Soft, nontender, non-distended  MS: No edema; No deformity. Neuro:  Nonfocal  Psych: Normal affect   Labs    High Sensitivity Troponin:   Recent Labs  Lab 12/19/21 1727 12/19/21 2005  TROPONINIHS 208* >24,000*      Chemistry Recent Labs  Lab 12/19/21 1727 12/19/21 1826 12/20/21 0216  NA 138 139 137  K 4.7 4.7 4.2  CL 106 107 107  CO2 20*  --  23  GLUCOSE 100* 103* 127*  BUN 18 18 22*  CREATININE 1.54* 1.30* 1.77*  CALCIUM 9.8  --  9.1  PROT 7.5  --   --   ALBUMIN 3.7  --   --   AST 29  --   --   ALT 9  --   --   ALKPHOS 107  --   --   BILITOT 0.9  --   --   GFRNONAA 55*  --  47*  ANIONGAP 12  --  7    Lipids  Recent Labs  Lab 12/19/21 1727  CHOL 166  TRIG 48  HDL 61  LDLCALC 95  CHOLHDL 2.7    Hematology Recent Labs  Lab 12/19/21 1727 12/19/21 1826  WBC 10.4  --   RBC 4.41  --   HGB 12.9* 13.6  HCT 41.1 40.0  MCV 93.2  --   MCH 29.3  --   MCHC 31.4  --  RDW 18.3*  --   PLT 299  --    Thyroid No results for input(s): TSH, FREET4 in the last 168 hours.  BNP Recent Labs  Lab 12/19/21 1929  BNP 809.0*    DDimer No results for input(s): DDIMER in the last 168 hours.   Radiology    CARDIAC CATHETERIZATION  Result Date: 12/19/2021   Dist LAD lesion is 100% stenosed.   There is severe left ventricular systolic dysfunction.   LV end diastolic pressure is severely elevated.   The left ventricular ejection fraction is less than 25% by visual estimate. CONCLUSIONS: Occlusion of apical LAD, suspect embolic versus plaque rupture with thrombosis. Right dominant coronary anatomy.  Significant increase in diameter of right coronary after 200 mcg of intracoronary nitroglycerin was administered. Generalized luminal irregularities throughout the left coronary system. Severe systolic dysfunction with EF less than 25% and LVEDP greater than 25 mmHg. RECOMMENDATIONS: Repeat echo.  Rule out apical thrombus. IV nitroglycerin. IV heparin.  Transition to Eliquis. Resume guideline directed therapy for systolic heart failure. Overall poor prognosis if unable to conquer substance use and is in terrible need of assistance with adverse social determinants of health.   DG Chest Port 1  View  Result Date: 12/19/2021 CLINICAL DATA:  Chest pain EXAM: PORTABLE CHEST 1 VIEW COMPARISON:  Radiograph September 08 2021 FINDINGS: Similar enlarged cardiac silhouette with pulmonary vascular congestion. No overt pulmonary edema. Similar left lower lobe subsegmental atelectasis. No visible pleural effusion or pneumothorax. No acute osseous abnormality. IMPRESSION: Similar enlarged cardiac silhouette with pulmonary vascular congestion. No overt pulmonary edema. Electronically Signed   By: Dahlia Bailiff M.D.   On: 12/19/2021 17:48    Cardiac Studies   Cath: 12/19/21   Dist LAD lesion is 100% stenosed.   There is severe left ventricular systolic dysfunction.   LV end diastolic pressure is severely elevated.   The left ventricular ejection fraction is less than 25% by visual estimate.   CONCLUSIONS: Occlusion of apical LAD, suspect embolic versus plaque rupture with thrombosis. Right dominant coronary anatomy.  Significant increase in diameter of right coronary after 200 mcg of intracoronary nitroglycerin was administered. Generalized luminal irregularities throughout the left coronary system. Severe systolic dysfunction with EF less than 25% and LVEDP greater than 25 mmHg.   RECOMMENDATIONS:   Repeat echo.  Rule out apical thrombus. IV nitroglycerin. IV heparin.  Transition to Eliquis. Resume guideline directed therapy for systolic heart failure. Overall poor prognosis if unable to conquer substance use and is in terrible need of assistance with adverse social determinants of health.   Diagnostic Dominance: Right   Patient Profile     51 y.o. 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   Assessment & Plan    STEMI: Underwent cardiac catheterization noted above with 100% occlusion of apical LAD suspected to be embolic versus  plaque rupture with thrombosis.  Also with generalized luminal irregularities throughout left coronary system.  hsTn peaked >24000. Recommendations for medical management.  -- start '81mg'$ , plavix '75mg'$  daily, increase atorvastatin to 80 mg daily -- Echo pending  HFrEF/NICM: Known LVEF of 25%, LVEDP of 40 mmHg.  Received IV Lasix 20 mg post cath. He is significantly volume overloaded, + JVD --will start lasix gtt, continue nitro gtt -- check lactic acid, CMP later today -- hold BB, Entresto -- continue jardiance and BiDil  --  echo pending  Hypertension: blood pressures remain significantly elevated -- continue nitro gtt, BiDil   AKI: Cr 1.5>>1.3>>1.7 this morning -- follow BMET -- holding Entresto   Hyperlipidemia:  -- increased atorvastatin to '80mg'$  daily   Mitral stenosis: 2/2 rheumatic heart disease. Noted as moderate on echo 08/2021, showed restricted leaflets c/f  rheumatic heart disease. MVA area by planimetry 1.45 cm2 which is severe. Moderate MR EOA 21. Wilkins ~ 9 -- planned for referral to [facility] for further evaluation but current social situation/polysubstance abuse makes this difficult  Hx of CVA: L MCA stroke back in 08/2021 -- appears he was on Eliquis PTA?, will hold for now as he has not demonstrated any episodes of afib in the event he needs invasive work up  Polysubstance abuse/homelessness: Last used cocaine day prior to admission, last drink was Monday. Reports 1-2 beers every few days  For questions or updates, please contact [place] Please consult [email or web address] for contact info under        Signed, Reino Bellis, NP  12/20/2021, 9:50 AM     ATTENDING ATTESTATION:  After conducting a review of all available clinical information with the care team, interviewing the patient, and performing a physical exam, I agree with the findings and plan described in this note.   GEN: Mild distress HEENT:  MMM, + JVD, no scleral icterus Cardiac: Tachycardic, no  murmurs, rubs, or gallops.  Respiratory: Clear to auscultation bilaterally. GI: Soft, nontender, non-distended  MS: No edema; No deformity.  Warm Neuro:  Nonfocal  Vasc:  +2 radial pulses  Patient is a 51 year old male with a history of substance abuse, nonischemic cardiomyopathy with ejection fraction of 25%, and moderate mitral stenosis who is homeless.  He presented due to chest pain and an EKG performed in the emergency department demonstrated anterior lateral and inferior ST elevations.  He was taken to the cardiac catheterization laboratory which demonstrated an occlusion of the distal LAD.  There was no other obstructive disease.  The LVEDP was 38 mmHg.  He was given a dose of Lasix but still remains net positive.  On examination this morning he is overtly volume overloaded and uncomfortable.  He however is warm and well-perfused.  We will start a Lasix drip to hasten diuresis.  We will check CMP later this afternoon.  I expect that his creatinine will bump a bit.  He does not look to be in cardiogenic shock as his blood pressure is quite high.  We will continue his nitroglycerin drip and BiDil for afterload reduction.  We will hold his Delene Loll for now given elevated creatinine.  If he does not respond to these measures we may consider a right heart catheterization.  Start aspirin and Plavix for medical treatment of acute myocardial infarction with 6 months worth of DAPT.  We will stop Eliquis as patient has no clear reason to be on this anticoagulant.  Lenna Sciara, MD Pager [phone]

## 2021-12-20 NOTE — Progress Notes (Incomplete)
Heart Failure Stewardship Pharmacist Progress Note   PCP: Placey, Audrea Muscat, NP PCP-Cardiologist: Janina Mayo, MD    HPI:  ***  Current HF Medications: {HF MEDS YKZLDJT:70177}  Prior to admission HF Medications: {HF MEDS PTA:26664}  Pertinent Lab Values: Serum creatinine ***, BUN ***, Potassium ***, Sodium ***, BNP ***, Magnesium ***, A1c ***, Digoxin ***   Vital Signs: Weight: *** lbs (admission weight: *** lbs) Blood pressure: ***  Heart rate: ***  I/O: -***L yesterday; net -***L  Medication Assistance / Insurance Benefits Check: Does the patient have prescription insurance?  {Yes/No/Pending:24180} Type of insurance plan: ***  Does the patient qualify for medication assistance through manufacturers or grants?   {Yes/No/Pending:24180} Eligible grants and/or patient assistance programs: *** Medication assistance applications in progress: ***  Medication assistance applications approved: *** Approved medication assistance renewals will be completed by: ***  Outpatient Pharmacy:  Prior to admission outpatient pharmacy: *** Is the patient willing to use [facility] at discharge? {Yes/No/Pending:24180} Is the patient willing to transition their outpatient pharmacy to utilize a [facility] outpatient pharmacy?   {Yes/No/Pending:24180}    Assessment: 1. Acute on ***chronic ***systolic CHF (LVEF ***%), due to ***. NYHA class *** symptoms. -    Plan: 1) Medication changes recommended at this time: -  2) Patient assistance: -  3)  Education  - To be completed prior to discharge  Kerby Nora, PharmD, BCPS Heart Failure Stewardship Pharmacist Phone [phone]

## 2021-12-20 NOTE — Progress Notes (Signed)
Pt BP running low. Average over this shift has been high 90s-100s/60s-70s. Paged Dr. Blossom Hoops regarding pt's scheduled Bidil. He gave a verbal order to give the medication as long as pt remains asymptomatic. Bidil given around 2150. Pt is currently resting, will continue to monitor.

## 2021-12-21 ENCOUNTER — Inpatient Hospital Stay: Payer: Self-pay

## 2021-12-21 DIAGNOSIS — I5042 Chronic combined systolic (congestive) and diastolic (congestive) heart failure: Secondary | ICD-10-CM

## 2021-12-21 DIAGNOSIS — I5043 Acute on chronic combined systolic (congestive) and diastolic (congestive) heart failure: Secondary | ICD-10-CM

## 2021-12-21 LAB — BASIC METABOLIC PANEL
Anion gap: 14 (ref 5–15)
Anion gap: 13 (ref 5–15)
BUN: 22 mg/dL — ABNORMAL HIGH (ref 6–20)
BUN: 28 mg/dL — ABNORMAL HIGH (ref 6–20)
CO2: 25 mmol/L (ref 22–32)
CO2: 27 mmol/L (ref 22–32)
Calcium: 9.7 mg/dL (ref 8.9–10.3)
Calcium: 9.8 mg/dL (ref 8.9–10.3)
Chloride: 97 mmol/L — ABNORMAL LOW (ref 98–111)
Chloride: 95 mmol/L — ABNORMAL LOW (ref 98–111)
Creatinine, Ser: 2 mg/dL — ABNORMAL HIGH (ref 0.61–1.24)
Creatinine, Ser: 2.04 mg/dL — ABNORMAL HIGH (ref 0.61–1.24)
GFR, Estimated: 40 mL/min — ABNORMAL LOW (ref >60)
GFR, Estimated: 39 mL/min — ABNORMAL LOW (ref >60)
Glucose, Bld: 120 mg/dL — ABNORMAL HIGH (ref 70–99)
Glucose, Bld: 107 mg/dL — ABNORMAL HIGH (ref 70–99)
Potassium: 4.1 mmol/L (ref 3.5–5.1)
Potassium: 3.9 mmol/L (ref 3.5–5.1)
Sodium: 136 mmol/L (ref 135–145)
Sodium: 135 mmol/L (ref 135–145)

## 2021-12-21 LAB — COMPREHENSIVE METABOLIC PANEL
ALT: 14 U/L (ref 0–44)
AST: 62 U/L — ABNORMAL HIGH (ref 15–41)
Albumin: 3 g/dL — ABNORMAL LOW (ref 3.5–5.0)
Alkaline Phosphatase: 85 U/L (ref 38–126)
Anion gap: 12 (ref 5–15)
BUN: 25 mg/dL — ABNORMAL HIGH (ref 6–20)
CO2: 25 mmol/L (ref 22–32)
Calcium: 8.7 mg/dL — ABNORMAL LOW (ref 8.9–10.3)
Chloride: 79 mmol/L — ABNORMAL LOW (ref 98–111)
Creatinine, Ser: 1.92 mg/dL — ABNORMAL HIGH (ref 0.61–1.24)
GFR, Estimated: 42 mL/min — ABNORMAL LOW (ref >60)
Glucose, Bld: 124 mg/dL — ABNORMAL HIGH (ref 70–99)
Potassium: 3.4 mmol/L — ABNORMAL LOW (ref 3.5–5.1)
Sodium: 116 mmol/L — CL (ref 135–145)
Total Bilirubin: 0.6 mg/dL (ref 0.3–1.2)
Total Protein: 6.8 g/dL (ref 6.5–8.1)

## 2021-12-21 LAB — BLOOD GAS, ARTERIAL
Acid-Base Excess: 8.6 mmol/L — ABNORMAL HIGH (ref 0.0–2.0)
Bicarbonate: 31.6 mmol/L — ABNORMAL HIGH (ref 20.0–28.0)
Drawn by: 336832
O2 Saturation: 97.5 %
Patient temperature: 37
pCO2 arterial: 37 mmHg (ref 32–48)
pH, Arterial: 7.54 — ABNORMAL HIGH (ref 7.35–7.45)
pO2, Arterial: 84 mmHg (ref 83–108)

## 2021-12-21 LAB — COOXEMETRY PANEL
Carboxyhemoglobin: 2.2 % — ABNORMAL HIGH (ref 0.5–1.5)
Methemoglobin: 0.7 % (ref 0.0–1.5)
O2 Saturation: 74.9 %
Total hemoglobin: 14.7 g/dL (ref 12.0–16.0)

## 2021-12-21 LAB — LIPOPROTEIN A (LPA): Lipoprotein (a): 75.2 nmol/L — ABNORMAL HIGH

## 2021-12-21 LAB — MAGNESIUM: Magnesium: 1.9 mg/dL (ref 1.7–2.4)

## 2021-12-21 LAB — LACTIC ACID, PLASMA: Lactic Acid, Venous: 0.4 mmol/L — ABNORMAL LOW (ref 0.5–1.9)

## 2021-12-21 MED ORDER — SODIUM CHLORIDE 0.9% FLUSH
10.0000 mL | Freq: Two times a day (BID) | INTRAVENOUS | Status: DC
  Administered 2021-12-21 – 2021-12-24 (×7): 10 mL
  Administered 2021-12-25 – 2021-12-26 (×2): 20 mL
  Administered 2021-12-26 – 2021-12-27 (×2): 10 mL

## 2021-12-21 MED ORDER — SODIUM CHLORIDE 0.9% FLUSH
10.0000 mL | INTRAVENOUS | Status: DC | PRN
  Administered 2021-12-27: 10 mL

## 2021-12-21 MED ORDER — METOPROLOL SUCCINATE ER 25 MG PO TB24: 12.5000 mg | ORAL_TABLET | Freq: Every day | ORAL | Status: DC

## 2021-12-21 MED ORDER — DOBUTAMINE IN D5W 4-5 MG/ML-% IV SOLN
5.0000 ug/kg/min | INTRAVENOUS | Status: DC
  Filled 2021-12-21: qty 250

## 2021-12-21 MED ORDER — APIXABAN 5 MG PO TABS
5.0000 mg | ORAL_TABLET | Freq: Two times a day (BID) | ORAL | Status: DC
  Administered 2021-12-21 – 2021-12-27 (×12): 5 mg via ORAL
  Filled 2021-12-21 (×12): qty 1

## 2021-12-21 MED ORDER — ALTEPLASE 2 MG IJ SOLR
2.0000 mg | Freq: Once | INTRAMUSCULAR | Status: DC
  Filled 2021-12-21: qty 2

## 2021-12-21 MED ORDER — CHLORHEXIDINE GLUCONATE CLOTH 2 % EX PADS
6.0000 | MEDICATED_PAD | Freq: Every day | CUTANEOUS | Status: DC
  Administered 2021-12-21 – 2021-12-27 (×7): 6 via TOPICAL

## 2021-12-21 MED ORDER — MAGNESIUM SULFATE 2 GM/50ML IV SOLN
2.0000 g | Freq: Once | INTRAVENOUS | Status: AC
  Administered 2021-12-21: 2 g via INTRAVENOUS
  Filled 2021-12-21: qty 50

## 2021-12-21 MED ORDER — LIDOCAINE 5 % EX OINT
TOPICAL_OINTMENT | Freq: Once | CUTANEOUS | Status: AC
  Filled 2021-12-21: qty 35.44

## 2021-12-21 NOTE — Progress Notes (Addendum)
Progress Note  Patient Name: Craig Schmidt Date of Encounter: 12/21/2021  [facility] Cardiologist: Janina Mayo, MD   Subjective   Breathing is better this morning. Had chest pain overnight, but none this morning. Lay back in bed comfortably.   Inpatient Medications    Scheduled Meds:  aspirin EC  81 mg Oral Daily   atorvastatin  80 mg Oral Daily   clopidogrel  75 mg Oral Daily   empagliflozin  10 mg Oral Daily   sodium chloride flush  3 mL Intravenous Q12H   Continuous Infusions:  sodium chloride 10 mL/hr at 12/19/21 1744   sodium chloride     furosemide (LASIX) 200 mg in dextrose 5% 100 mL ('2mg'$ /mL) infusion 10 mg/hr (12/20/21 1229)   nitroGLYCERIN Stopped (12/20/21 1857)   PRN Meds: sodium chloride, acetaminophen, ondansetron (ZOFRAN) IV, polyvinyl alcohol, sodium chloride flush   Vital Signs    Vitals:   12/20/21 2221 12/20/21 2320 12/20/21 2349 12/21/21 0349  BP: 108/60 (!) 85/75 100/70 104/78  Pulse: 81 94 (!) 101 100  Resp: '20 20 19 20  '$ Temp: 98.5 F (36.9 C)  98.5 F (36.9 C) 98.1 F (36.7 C)  TempSrc:   Oral Oral  SpO2:  92% 94% 96%  Weight:      Height:        Intake/Output Summary (Last 24 hours) at 12/21/2021 0657 Last data filed at 12/21/2021 0410 Gross per 24 hour  Intake 603.61 ml  Output 4415 ml  Net -3811.39 ml      12/19/2021    6:07 PM 10/16/2021   10:15 AM 09/11/2021   10:40 AM  Last 3 Weights  Weight (lbs) 180 lb 168 lb 12.8 oz 176 lb 5.9 oz  Weight (kg) 81.647 kg 76.567 kg 80 kg      Telemetry    Sinus Tachy, couplets, PVCs - Personally Reviewed  ECG   No new tracing   Physical Exam   GEN: No acute distress. Laying back in bed. Neck: No JVD Cardiac: tachy, soft systolic murmur, no rubs, or gallops.  Respiratory: Clear to auscultation bilaterally. GI: Soft, nontender, non-distended  MS: No edema; No deformity. Neuro:  Nonfocal  Psych: Normal affect   Labs    High Sensitivity Troponin:   Recent Labs  Lab  12/19/21 1727 12/19/21 2005  TROPONINIHS 208* >24,000*     Chemistry Recent Labs  Lab 12/19/21 1727 12/19/21 1826 12/20/21 0216 12/20/21 1633 12/21/21 0236  NA 138   < > 137 136 136  K 4.7   < > 4.2 3.8 4.1  CL 106   < > 107 98 97*  CO2 20*  --  '23 25 25  '$ GLUCOSE 100*   < > 127* 160* 120*  BUN 18   < > 22* 19 22*  CREATININE 1.54*   < > 1.77* 1.87* 2.00*  CALCIUM 9.8  --  9.1 9.5 9.7  MG  --   --   --  1.9  --   PROT 7.5  --   --  7.2  --   ALBUMIN 3.7  --   --  3.4*  --   AST 29  --   --  166*  --   ALT 9  --   --  17  --   ALKPHOS 107  --   --  97  --   BILITOT 0.9  --   --  0.9  --   GFRNONAA 55*  --  47* 44* 40*  ANIONGAP 12  --  '7 13 14   '$ < > = values in this interval not displayed.    Lipids  Recent Labs  Lab 12/19/21 1727  CHOL 166  TRIG 48  HDL 61  LDLCALC 95  CHOLHDL 2.7    Hematology Recent Labs  Lab 12/19/21 1727 12/19/21 1826  WBC 10.4  --   RBC 4.41  --   HGB 12.9* 13.6  HCT 41.1 40.0  MCV 93.2  --   MCH 29.3  --   MCHC 31.4  --   RDW 18.3*  --   PLT 299  --    Thyroid No results for input(s): TSH, FREET4 in the last 168 hours.  BNP Recent Labs  Lab 12/19/21 1929  BNP 809.0*    DDimer No results for input(s): DDIMER in the last 168 hours.   Radiology    CARDIAC CATHETERIZATION  Result Date: 12/19/2021   Dist LAD lesion is 100% stenosed.   There is severe left ventricular systolic dysfunction.   LV end diastolic pressure is severely elevated.   The left ventricular ejection fraction is less than 25% by visual estimate. CONCLUSIONS: Occlusion of apical LAD, suspect embolic versus plaque rupture with thrombosis. Right dominant coronary anatomy.  Significant increase in diameter of right coronary after 200 mcg of intracoronary nitroglycerin was administered. Generalized luminal irregularities throughout the left coronary system. Severe systolic dysfunction with EF less than 25% and LVEDP greater than 25 mmHg. RECOMMENDATIONS: Repeat echo.   Rule out apical thrombus. IV nitroglycerin. IV heparin.  Transition to Eliquis. Resume guideline directed therapy for systolic heart failure. Overall poor prognosis if unable to conquer substance use and is in terrible need of assistance with adverse social determinants of health.   DG Chest Port 1 View  Result Date: 12/19/2021 CLINICAL DATA:  Chest pain EXAM: PORTABLE CHEST 1 VIEW COMPARISON:  Radiograph September 08 2021 FINDINGS: Similar enlarged cardiac silhouette with pulmonary vascular congestion. No overt pulmonary edema. Similar left lower lobe subsegmental atelectasis. No visible pleural effusion or pneumothorax. No acute osseous abnormality. IMPRESSION: Similar enlarged cardiac silhouette with pulmonary vascular congestion. No overt pulmonary edema. Electronically Signed   By: Dahlia Bailiff M.D.   On: 12/19/2021 17:48   ECHOCARDIOGRAM COMPLETE  Result Date: 12/20/2021    ECHOCARDIOGRAM REPORT   Patient Name:   Craig Schmidt Date of Exam: 12/20/2021 Medical Rec #:  [id number]          Height:       66.0 in Accession #:    [id number]         Weight:       180.0 lb Date of Birth:  01/30/1971           BSA:          1.912 m Patient Age:    51 years           BP:           121/81 mmHg Patient Gender: M                  HR:           70 bpm. Exam Location:  Inpatient Procedure: 2D Echo, 3D Echo, Cardiac Doppler, Color Doppler, Strain Analysis and            Intracardiac Opacification Agent Indications:    MI  History:        Patient has prior history of Echocardiogram examinations, most  recent 10/16/2021. Cath 12/19/21.  Sonographer:    Luisa Hart RDCS Referring Phys: [place]  1. Left ventricular ejection fraction, by estimation, is <20%. The left ventricle has severely decreased function. The left ventricle demonstrates global hypokinesis. There is mild concentric left ventricular hypertrophy. Left ventricular diastolic parameters are consistent with Grade I  diastolic dysfunction (impaired relaxation). Elevated left ventricular end-diastolic pressure. The average left ventricular global longitudinal strain is -5.1 %. The global longitudinal strain is abnormal.  2. Right ventricular systolic function is normal. The right ventricular size is normal. There is normal pulmonary artery systolic pressure.  3. Left atrial size was mildly dilated.  4. The mitral valve is normal in structure. Trivial mitral valve regurgitation. No evidence of mitral stenosis.  5. The aortic valve is tricuspid. Aortic valve regurgitation is not visualized. No aortic stenosis is present.  6. The inferior vena cava is normal in size with greater than 50% respiratory variability, suggesting right atrial pressure of 3 mmHg. FINDINGS  Left Ventricle: Left ventricular ejection fraction, by estimation, is <20%. The left ventricle has severely decreased function. The left ventricle demonstrates global hypokinesis. Definity contrast agent was given IV to delineate the left ventricular endocardial borders. The average left ventricular global longitudinal strain is -5.1 %. The global longitudinal strain is abnormal. The left ventricular internal cavity size was normal in size. There is mild concentric left ventricular hypertrophy. Left ventricular diastolic parameters are consistent with Grade I diastolic dysfunction (impaired relaxation). Elevated left ventricular end-diastolic pressure. Right Ventricle: The right ventricular size is normal. No increase in right ventricular wall thickness. Right ventricular systolic function is normal. There is normal pulmonary artery systolic pressure. The tricuspid regurgitant velocity is 2.15 m/s, and  with an assumed right atrial pressure of 3 mmHg, the estimated right ventricular systolic pressure is 26.3 mmHg. Left Atrium: Left atrial size was mildly dilated. Right Atrium: Right atrial size was normal in size. Pericardium: Trivial pericardial effusion is present.  Mitral Valve: The mitral valve is normal in structure. Trivial mitral valve regurgitation. No evidence of mitral valve stenosis. MV peak gradient, 11.1 mmHg. The mean mitral valve gradient is 5.5 mmHg. Tricuspid Valve: The tricuspid valve is normal in structure. Tricuspid valve regurgitation is trivial. No evidence of tricuspid stenosis. Aortic Valve: The aortic valve is tricuspid. Aortic valve regurgitation is not visualized. No aortic stenosis is present. Aortic valve mean gradient measures 3.2 mmHg. Aortic valve peak gradient measures 6.2 mmHg. Pulmonic Valve: The pulmonic valve was normal in structure. Pulmonic valve regurgitation is not visualized. No evidence of pulmonic stenosis. Aorta: The aortic root is normal in size and structure. Venous: The inferior vena cava is normal in size with greater than 50% respiratory variability, suggesting right atrial pressure of 3 mmHg. IAS/Shunts: No atrial level shunt detected by color flow Doppler.  LEFT VENTRICLE PLAX 2D LVIDd:         4.70 cm      Diastology LVIDs:         4.30 cm      LV e' medial:    4.13 cm/s LV PW:         1.40 cm      LV E/e' medial:  28.3 LV IVS:        1.10 cm      LV e' lateral:   4.80 cm/s  LV E/e' lateral: 24.4  LV Volumes (MOD)            2D Longitudinal Strain LV vol d, MOD A2C: 145.0 ml 2D Strain GLS (A2C):   -3.8 % LV vol d, MOD A4C: 102.0 ml 2D Strain GLS (A3C):   -5.8 % LV vol s, MOD A2C: 122.0 ml 2D Strain GLS (A4C):   -5.7 % LV vol s, MOD A4C: 81.5 ml  2D Strain GLS Avg:     -5.1 % LV SV MOD A2C:     23.0 ml LV SV MOD A4C:     102.0 ml LV SV MOD BP:      22.8 ml RIGHT VENTRICLE RV Basal diam:  3.10 cm RV Mid diam:    2.50 cm RV S prime:     9.23 cm/s TAPSE (M-mode): 1.5 cm LEFT ATRIUM           Index        RIGHT ATRIUM           Index LA diam:      4.50 cm 2.35 cm/m   RA Area:     12.90 cm LA Vol (A2C): 59.4 ml 31.06 ml/m  RA Volume:   30.30 ml  15.85 ml/m LA Vol (A4C): 76.0 ml 39.74 ml/m  AORTIC VALVE                    PULMONIC VALVE AV Vmax:           124.80 cm/s PV Vmax:       0.83 m/s AV Vmean:          83.720 cm/s PV Vmean:      57.950 cm/s AV VTI:            0.183 m     PV VTI:        0.134 m AV Peak Grad:      6.2 mmHg    PV Peak grad:  2.8 mmHg AV Mean Grad:      3.2 mmHg    PV Mean grad:  1.5 mmHg LVOT Vmax:         62.00 cm/s LVOT Vmean:        41.133 cm/s LVOT VTI:          0.092 m LVOT/AV VTI ratio: 0.50  AORTA Ao Asc diam: 2.50 cm MITRAL VALVE                TRICUSPID VALVE MV Area (PHT): 3.68 cm     TR Peak grad:   18.5 mmHg MV Peak grad:  11.1 mmHg    TR Vmax:        215.00 cm/s MV Mean grad:  5.5 mmHg MV Vmax:       1.66 m/s     SHUNTS MV Vmean:      105.5 cm/s   Systemic VTI: 0.09 m MV VTI:        0.41 m MV Decel Time: 206 msec MV E velocity: 117.00 cm/s MV A velocity: 131.00 cm/s MV E/A ratio:  0.89 Skeet Latch MD Electronically signed by Skeet Latch MD Signature Date/Time: 12/20/2021/4:31:01 PM    Final     Cardiac Studies   Echo: 12/20/21  IMPRESSIONS     1. Left ventricular ejection fraction, by estimation, is <20%. The left  ventricle has severely decreased function. The left ventricle demonstrates  global hypokinesis. There is mild concentric left ventricular hypertrophy.  Left ventricular diastolic  parameters  are consistent with Grade I diastolic dysfunction (impaired  relaxation). Elevated left ventricular end-diastolic pressure. The average  left ventricular global longitudinal strain is -5.1 %. The global  longitudinal strain is abnormal.   2. Right ventricular systolic function is normal. The right ventricular  size is normal. There is normal pulmonary artery systolic pressure.   3. Left atrial size was mildly dilated.   4. The mitral valve is normal in structure. Trivial mitral valve  regurgitation. No evidence of mitral stenosis.   5. The aortic valve is tricuspid. Aortic valve regurgitation is not  visualized. No aortic stenosis is present.   6. The  inferior vena cava is normal in size with greater than 50%  respiratory variability, suggesting right atrial pressure of 3 mmHg.   FINDINGS   Left Ventricle: Left ventricular ejection fraction, by estimation, is  <20%. The left ventricle has severely decreased function. The left  ventricle demonstrates global hypokinesis. Definity contrast agent was  given IV to delineate the left ventricular  endocardial borders. The average left ventricular global longitudinal  strain is -5.1 %. The global longitudinal strain is abnormal. The left  ventricular internal cavity size was normal in size. There is mild  concentric left ventricular hypertrophy. Left  ventricular diastolic parameters are consistent with Grade I diastolic  dysfunction (impaired relaxation). Elevated left ventricular end-diastolic  pressure.   Right Ventricle: The right ventricular size is normal. No increase in  right ventricular wall thickness. Right ventricular systolic function is  normal. There is normal pulmonary artery systolic pressure. The tricuspid  regurgitant velocity is 2.15 m/s, and   with an assumed right atrial pressure of 3 mmHg, the estimated right  ventricular systolic pressure is 86.7 mmHg.   Left Atrium: Left atrial size was mildly dilated.   Right Atrium: Right atrial size was normal in size.   Pericardium: Trivial pericardial effusion is present.   Mitral Valve: The mitral valve is normal in structure. Trivial mitral  valve regurgitation. No evidence of mitral valve stenosis. MV peak  gradient, 11.1 mmHg. The mean mitral valve gradient is 5.5 mmHg.   Tricuspid Valve: The tricuspid valve is normal in structure. Tricuspid  valve regurgitation is trivial. No evidence of tricuspid stenosis.   Aortic Valve: The aortic valve is tricuspid. Aortic valve regurgitation is  not visualized. No aortic stenosis is present. Aortic valve mean gradient  measures 3.2 mmHg. Aortic valve peak gradient measures 6.2  mmHg.   Pulmonic Valve: The pulmonic valve was normal in structure. Pulmonic valve  regurgitation is not visualized. No evidence of pulmonic stenosis.   Aorta: The aortic root is normal in size and structure.   Venous: The inferior vena cava is normal in size with greater than 50%  respiratory variability, suggesting right atrial pressure of 3 mmHg.   IAS/Shunts: No atrial level shunt detected by color flow Doppler.   Patient Profile     51 y.o. 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   Assessment & Plan    STEMI: Underwent cardiac catheterization noted above with 100% occlusion of apical LAD suspected to be embolic versus plaque rupture with thrombosis.  Also with generalized luminal irregularities throughout left coronary system.  hsTn peaked >24000. Recommendations for medical management. IV nitro has been weaned and DC'ed. Had some chest pain overnight, but none this morning. -- continue ASA '81mg'$ , plavix '75mg'$   daily, increase atorvastatin to 80 mg daily   HFrEF/NICM: Known LVEF of 25%, LVEDP of 40 mmHg.  Received IV Lasix 20 mg post cath, then started on lasix gtt. Lactic acid 1.5.  -- Echo 5/31 with further decline in EF < 20%, global hypokinesis, G1DD -- 4.4L UOP overnight, Net - 3.6L -- Cr up to 2 this morning, will hold lasix gtt -- holding BB, Entresto, BiDil as blood pressures became  -- continue jardiance -- RHC may be helpful   Hypertension: blood pressures were initially significantly elevated, became soft yesterday afternoon. IV nitro weaned/DC'ed -- holding BP meds currently  PVCs/Couplets: freq on telemetry -- BB was held yesterday with concern for low output, BP lower overnight -- K+4.1, recheck Mag   AKI: Cr 1.5>>1.3>>1.7>>2  -- holding Entresto, stop lasix gtt -- follow BMET   Hyperlipidemia:  --  increased atorvastatin to '80mg'$  daily  -- FLP/LFTs in 8 weeks    Mitral stenosis: 2/2 rheumatic heart disease. Noted as severe on echo/TEE 08/2021, showed restricted leaflets c/f  rheumatic heart disease. MVA area by planimetry 1.45 cm2 which is severe. Moderate MR EOA 21. Wilkins ~ 9 -- planned for referral to [facility] for further evaluation but current social situation/polysubstance abuse makes this difficult -- echo this admission reported trivial MS?, will review echo with MD   Hx of CVA: L MCA stroke back in 08/2021 -- appears he was on Eliquis PTA?, will hold for now as he has not demonstrated any episodes of afib, now needing ASA/plavix for STEMI   Polysubstance abuse/homelessness: Last used cocaine day prior to admission, last drink was Monday. Reports 1-2 beers every few days  For questions or updates, please contact [place] Please consult [email or web address] for contact info under        Signed, Reino Bellis, NP  12/21/2021, 6:57 AM     ATTENDING ATTESTATION:  After conducting a review of all available clinical information with the care team, interviewing the patient, and performing a physical exam, I agree with the findings and plan described in this note.   GEN: No acute distress.   HEENT:  MMM, no JVD, no scleral icterus Cardiac: Tachycardic, no murmurs, rubs, or gallops.  Respiratory: Clear to auscultation bilaterally. GI: Soft, nontender, non-distended  MS: No edema; No deformity.  Warm and well perfused Neuro:  Nonfocal  Vasc:  +2 radial pulses  Patient is improved today after very brisk diuresis (-3.8L) but is still at risk for decompensation.  He became hypotensive this morning.  However his ejection fraction being so low I think a blood pressure in the 80s or 90s is reasonable if he is not symptomatic from it.  His creatinine is up to 2 so we will stop his Lasix drip.  Given his relatively low blood pressures I do not think Entresto will be tolerated.  We will resume  Toprol XL 12.5 mg at bedtime.  Once his creatinine has stabilized we will stop plan on starting losartan and spironolactone at bedtime.  We will continue Jardiance.  We will make the patient n.p.o. for a RHC as I think it make sense to assess his volume status prior to discharge to make sure he is optimized.  Lenna Sciara, MD Pager [phone]

## 2021-12-21 NOTE — Progress Notes (Addendum)
    Followed up on patient as BP remain soft, does report increased shortness of breath and more chest pain this afternoon. Did discuss with attending MD, Dr. Harrell Gave regarding patient. Given rise in Cr, soft blood pressures and EF now <20%, will order PICC and coox. Reviewed with Dr. Ali Lowe. Check lactic acid. Stop BB. Per Dr. Ali Lowe, AHF consult.   SignedReino Bellis, NP-C 12/21/2021, 1:06 PM Pager: [phone]

## 2021-12-21 NOTE — Progress Notes (Signed)
Lidocaine medicine ordered by NP Mancel Bale so pt will let RRT stick for ABG.

## 2021-12-21 NOTE — Progress Notes (Signed)
ReDS clip reading performed =31%

## 2021-12-21 NOTE — Progress Notes (Signed)
Heart Failure Navigator Progress Note  Assessed for Heart & Vascular TOC clinic readiness.  Patient does not meet criteria due to Advanced Heart Failure team is consulted.    Earnestine Leys, BSN, Clinical cytogeneticist Only

## 2021-12-21 NOTE — Progress Notes (Signed)
Peripherally Inserted Central Catheter Placement  The IV Nurse has discussed with the patient and/or persons authorized to consent for the patient, the purpose of this procedure and the potential benefits and risks involved with this procedure.  The benefits include less needle sticks, lab draws from the catheter, and the patient may be discharged home with the catheter. Risks include, but not limited to, infection, bleeding, blood clot (thrombus formation), and puncture of an artery; nerve damage and irregular heartbeat and possibility to perform a PICC exchange if needed/ordered by physician.  Alternatives to this procedure were also discussed.  Bard Power PICC patient education guide, fact sheet on infection prevention and patient information card has been provided to patient /or left at bedside.    PICC Placement Documentation  PICC Double Lumen 12/21/21 Left Brachial 45 cm 0 cm (Active)  Indication for Insertion or Continuance of Line Vasoactive infusions 12/21/21 1631  Exposed Catheter (cm) 0 cm 12/21/21 1631  Site Assessment Clean, Dry, Intact 12/21/21 1631  Lumen #1 Status Flushed;Saline locked;Blood return noted 12/21/21 1631  Lumen #2 Status Flushed;Saline locked;Blood return noted 12/21/21 1631  Dressing Type Transparent;Securing device 12/21/21 1631  Dressing Status Antimicrobial disc in place;Clean, Dry, Intact 12/21/21 1631  Safety Lock Not Applicable 50/35/46 5681  Line Adjustment (NICU/IV Team Only) No 12/21/21 1631  Dressing Intervention Other (Comment) 12/21/21 1631  Dressing Change Due 12/28/21 12/21/21 1631    Patient have a recent stroke affecting R side weakness. PICC line placed on LUA.   Craig Schmidt 12/21/2021, 4:32 PM

## 2021-12-21 NOTE — Progress Notes (Incomplete)
Date and time results received: 12/21/21 1834 (use smartphrase ".now" to insert current time)  Test: BMP Critical Value: Na 116  Name of Provider Notified: Sarajane Jews, [place]  Orders Received? Or Actions Taken?: {ED Critical Value actions [phone]

## 2021-12-21 NOTE — Progress Notes (Addendum)
Advanced Heart Failure Team Consult Note   Primary Physician: Marliss Coots, NP PCP-Cardiologist:  Janina Mayo, MD  Reason for Consultation: Acute on chronic systolic CHF  HPI:    Craig Schmidt is seen today for evaluation of acute on chronic systolic CHF at the request of Dr. Chauncey Cruel. 51 y.o. 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  Admitted 02/23 with left MCA CVA s/p TNK. Stroke felt to be likely secondary to cardiomyopathy. Echo EF 25-30%,k RV mildly reduced, RVSP 50 mmHg, moderate MS, mild to moderate MR, moderate to severe TR, dilated IVC. TEE 02/23: EF 25-30%, RV severely reduced, MV stenotic (? Parachute mitral valve), MVA 1.45 cm2 consistent with severe MS, moderate MR, no interatrial shunt, no LV or LAA thrombus. Cardiology consulted. CM likely d/t subtance abuse +/- uncontrolled HTN. Had been started on coumadin but later switched to eliquis d/t concern for compliance given his social situation.  Presented 12/19/21 with acute inferolateral STEMI. Had used cocaine 24 hrs prior to presentation. Last dose of Eliquis uncertain. Admitted under Cardiology an emergent LHC with 100% apical LAD suspected embolic vs plaque rupture with thrombosis. LVEDP 40. Echo this admit: EF < 20%, ? RV okay. He was significantly volume overloaded and hypertensive. Initiated on lasix gtt and nitro gtt. Had good diuresis but Scr began to trend up, 1.5>1.77>1.87>2.0.  Developed soft BP (90s-low 100s) yesterday evening. BP meds stopped. Diuretics discontinued. ReDS today 31%. Lactic acid ordered. There was concern for low-output. Placing PICC for CVP and CO-OX monitoring.  Currently denies dyspnea or CP. Sitting up in bed eating lunch. BP improving.  States he was taking some medications prior to admission but doesn't know which ones.  Review of Systems:  [y] = yes, '[ ]'$  = no   General: Weight gain '[ ]'$ ; Weight loss '[ ]'$ ; Anorexia '[ ]'$ ; Fatigue '[ ]'$ ; Fever '[ ]'$ ; Chills '[ ]'$ ; Weakness '[ ]'$   Cardiac: Chest pain/pressure [Y]; Resting SOB [Y]; Exertional SOB [Y]; Orthopnea '[ ]'$ ; Pedal Edema '[ ]'$ ; Palpitations '[ ]'$ ; Syncope '[ ]'$ ; Presyncope '[ ]'$ ; Paroxysmal nocturnal dyspnea'[ ]'$   Pulmonary: Cough '[ ]'$ ; Wheezing'[ ]'$ ; Hemoptysis'[ ]'$ ; Sputum '[ ]'$ ; Snoring '[ ]'$   GI: Vomiting'[ ]'$ ; Dysphagia'[ ]'$ ; Melena'[ ]'$ ; Hematochezia '[ ]'$ ; Heartburn'[ ]'$ ; Abdominal pain '[ ]'$ ; Constipation '[ ]'$ ; Diarrhea '[ ]'$ ; BRBPR '[ ]'$   GU: Hematuria'[ ]'$ ; Dysuria '[ ]'$ ; Nocturia'[ ]'$   Vascular: Pain in legs with walking '[ ]'$ ; Pain in feet with lying flat '[ ]'$ ; Non-healing sores '[ ]'$ ; Stroke [Y]; TIA '[ ]'$ ; Slurred speech '[ ]'$ ;  Neuro: Headaches'[ ]'$ ; Vertigo'[ ]'$ ; Seizures'[ ]'$ ; Paresthesias'[ ]'$ ;Blurred vision '[ ]'$ ; Diplopia '[ ]'$ ; Vision changes '[ ]'$   Ortho/Skin: Arthritis '[ ]'$ ; Joint pain '[ ]'$ ; Muscle pain '[ ]'$ ; Joint swelling '[ ]'$ ; Back Pain '[ ]'$ ; Rash '[ ]'$   Psych: Depression'[ ]'$ ; Anxiety'[ ]'$   Heme: Bleeding problems '[ ]'$ ; Clotting disorders '[ ]'$ ; Anemia '[ ]'$   Endocrine: Diabetes '[ ]'$ ; Thyroid dysfunction'[ ]'$   Home Medications Prior to Admission medications   Medication Sig Start Date End Date Taking? Authorizing Provider  acetic acid 2 % otic solution Place 4 drops into the right ear as needed (right ear pain). Patient taking differently: Place 4 drops into the right ear daily as needed (right ear pain). 10/03/21  Yes de Yolanda Manges, Cortney E, NP  apixaban (ELIQUIS) 5 MG  TABS tablet Take 1 tablet (5 mg total) by mouth 2 (two) times daily. 12/04/21  Yes Janina Mayo, MD  atorvastatin (LIPITOR) 40 MG tablet Take 1 tablet (40 mg total) by mouth daily. 12/04/21  Yes BranchRoyetta Crochet, MD  calamine lotion Apply 1 application. topically as needed for itching. 10/03/21  Yes de Yolanda Manges, Cortney E, NP  carvedilol (COREG) 3.125 MG tablet Take 1 tablet (3.125 mg total) by mouth 2 (two) times daily with a meal. 12/04/21  Yes Branch, Royetta Crochet, MD  empagliflozin  (JARDIANCE) 10 MG TABS tablet Take 1 tablet (10 mg total) by mouth daily. 10/04/21  Yes de Yolanda Manges, Cortney E, NP  isosorbide-hydrALAZINE (BIDIL) 20-37.5 MG tablet Take 1 tablet by mouth 3 (three) times daily. 12/04/21  Yes BranchRoyetta Crochet, MD  polyvinyl alcohol (LIQUIFILM TEARS) 1.4 % ophthalmic solution Place 2 drops into both eyes as needed for dry eyes. Patient taking differently: Place 2 drops into both eyes daily as needed for dry eyes. 10/03/21  Yes de Yolanda Manges, Cortney E, NP  sacubitril-valsartan (ENTRESTO) 24-26 MG Take 1 tablet by mouth 2 (two) times daily. 12/04/21  Yes BranchRoyetta Crochet, MD  sodium chloride (OCEAN) 0.65 % nasal spray Place 1 spray into both nostrils as needed for congestion. Patient taking differently: 1 spray daily as needed for congestion. 10/03/21  Yes de Yolanda Manges, Cortney E, NP  nicotine (NICODERM CQ - DOSED IN MG/24 HR) 7 mg/24hr patch Place 1 patch (7 mg total) onto the skin daily. Patient not taking: Reported on 12/19/2021 10/04/21   de Ashley Murrain, NP    Past Medical History: Past Medical History:  Diagnosis Date   Asthma    Brain tumor [facility])     Past Surgical History: Past Surgical History:  Procedure Laterality Date   BUBBLE STUDY  09/11/2021   Procedure: BUBBLE STUDY;  Surgeon: Fay Records, MD;  Location: [place];  Service: Cardiovascular;;   LEFT HEART CATH AND CORONARY ANGIOGRAPHY N/A 12/19/2021   Procedure: LEFT HEART CATH AND CORONARY ANGIOGRAPHY;  Surgeon: Belva Crome, MD;  Location: [place] [facility];  Service: Cardiovascular;  Laterality: N/A;   TEE WITHOUT CARDIOVERSION N/A 09/11/2021   Procedure: TRANSESOPHAGEAL ECHOCARDIOGRAM (TEE);  Surgeon: Fay Records, MD;  Location: [facility] ENDOSCOPY;  Service: Cardiovascular;  Laterality: N/A;    Family History: No family history on file.  Social History: Social History   Socioeconomic History   Marital status: Significant Other    Spouse name: Not on file   Number of children: 7   Years  of education: Not on file   Highest education level: 9th grade  Occupational History   Occupation: unemployed    Comment: experiencing homelessness-living at daily rent motels.  Tobacco Use   Smoking status: Every Day    Packs/day: 1.00    Years: 41.00    Pack years: 41.00    Types: Cigarettes    Passive exposure: Past   Smokeless tobacco: Never  Vaping Use   Vaping Use: Never used  Substance and Sexual Activity   Alcohol use: Yes    Alcohol/week: 63.0 standard drinks    Types: 63 Cans of beer per week    Comment: 3-40oz beer/day x10 years. 1-2 40oz beer/day in 20s/30s   Drug use: Yes    Types: "Crack" cocaine, Cocaine    Comment: last use 12/18/21   Sexual activity: Not on file  Other Topics Concern   Not on file  Social History Narrative   Not on file   Social Determinants of Health   Financial Resource Strain: High Risk   Difficulty of Paying Living Expenses: Very hard  Food Insecurity: No Food Insecurity   Worried About Charity fundraiser in the Last Year: Never true   Arboriculturist in the Last Year: Never true  Transportation Needs: Unmet Transportation Needs   Lack of Transportation (Medical): Yes   Lack of Transportation (Non-Medical): Yes  Physical Activity: Not on file  Stress: Not on file  Social Connections: Not on file    Allergies:  Allergies  Allergen Reactions   Peanut-Containing Drug Products Itching   Shellfish Allergy Swelling    Objective:    Vital Signs:   Temp:  [98.1 F (36.7 C)-98.5 F (36.9 C)] 98.2 F (36.8 C) (06/01 1100) Pulse Rate:  [48-105] 99 (06/01 1100) Resp:  [16-23] 20 (06/01 0349) BP: (85-125)/(52-78) 104/78 (06/01 0349) SpO2:  [92 %-100 %] 97 % (06/01 1100) Last BM Date : 12/20/21  Weight change: Filed Weights   12/19/21 1807  Weight: 81.6 kg    Intake/Output:   Intake/Output Summary (Last 24 hours) at 12/21/2021 1347 Last data filed at 12/21/2021 1126 Gross per 24 hour  Intake 523.11 ml  Output 3540 ml   Net -3016.89 ml      Physical Exam    General:  No distress. Sitting up in bed. HEENT: normal Neck: supple. JVP not elevated. Carotids 2+ bilat; no bruits.  Cor: PMI nondisplaced. Regular rate & rhythm. No rubs, gallops or murmurs. Lungs: clear Abdomen: soft, nontender, nondistended.  Extremities: no cyanosis, clubbing, rash, edema Neuro: alert & orientedx3, cranial nerves grossly intact. moves all 4 extremities w/o difficulty. Affect pleasant   Telemetry   Sinus tach 100s, PVCs   EKG    Sinus tach 113, ST elevation inferior and anterolateral leads  Labs   Basic Metabolic Panel: Recent Labs  Lab 12/19/21 1727 12/19/21 1826 12/20/21 0216 12/20/21 1633 12/21/21 0236  NA 138 139 137 136 136  K 4.7 4.7 4.2 3.8 4.1  CL 106 107 107 98 97*  CO2 20*  --  '23 25 25  '$ GLUCOSE 100* 103* 127* 160* 120*  BUN 18 18 22* 19 22*  CREATININE 1.54* 1.30* 1.77* 1.87* 2.00*  CALCIUM 9.8  --  9.1 9.5 9.7  MG  --   --   --  1.9 1.9    Liver Function Tests: Recent Labs  Lab 12/19/21 1727 12/20/21 1633  AST 29 166*  ALT 9 17  ALKPHOS 107 97  BILITOT 0.9 0.9  PROT 7.5 7.2  ALBUMIN 3.7 3.4*   No results for input(s): LIPASE, AMYLASE in the last 168 hours. No results for input(s): AMMONIA in the last 168 hours.  CBC: Recent Labs  Lab 12/19/21 1727 12/19/21 1826  WBC 10.4  --   NEUTROABS 7.9*  --   HGB 12.9* 13.6  HCT 41.1 40.0  MCV 93.2  --   PLT 299  --     Cardiac Enzymes: No results for input(s): CKTOTAL, CKMB, CKMBINDEX, TROPONINI in the last 168 hours.  BNP: BNP (last 3 results) Recent Labs    09/10/21 0400 12/19/21 1929  BNP 1,570.7* 809.0*    ProBNP (last 3 results) No results for input(s): PROBNP in the last 8760 hours.   CBG: No results for input(s): GLUCAP in the last 168 hours.  Coagulation Studies: Recent Labs    12/19/21 1727  LABPROT 13.5  INR 1.0     Imaging   ECHOCARDIOGRAM COMPLETE  Result Date: 12/20/2021    ECHOCARDIOGRAM  REPORT   Patient Name:   GAREN WOOLBRIGHT Date of Exam: 12/20/2021 Medical Rec #:  [id number]          Height:       66.0 in Accession #:    [id number]         Weight:       180.0 lb Date of Birth:  [birth year]           BSA:          1.912 m Patient Age:    28 years           BP:           121/81 mmHg Patient Gender: M                  HR:           70 bpm. Exam Location:  Inpatient Procedure: 2D Echo, 3D Echo, Cardiac Doppler, Color Doppler, Strain Analysis and            Intracardiac Opacification Agent Indications:    MI  History:        Patient has prior history of Echocardiogram examinations, most                 recent 10/16/2021. Cath 12/19/21.  Sonographer:    Luisa Hart RDCS Referring Phys: [place]  1. Left ventricular ejection fraction, by estimation, is <20%. The left ventricle has severely decreased function. The left ventricle demonstrates global hypokinesis. There is mild concentric left ventricular hypertrophy. Left ventricular diastolic parameters are consistent with Grade I diastolic dysfunction (impaired relaxation). Elevated left ventricular end-diastolic pressure. The average left ventricular global longitudinal strain is -5.1 %. The global longitudinal strain is abnormal.  2. Right ventricular systolic function is normal. The right ventricular size is normal. There is normal pulmonary artery systolic pressure.  3. Left atrial size was mildly dilated.  4. The mitral valve is normal in structure. Trivial mitral valve regurgitation. No evidence of mitral stenosis.  5. The aortic valve is tricuspid. Aortic valve regurgitation is not visualized. No aortic stenosis is present.  6. The inferior vena cava is normal in size with greater than 50% respiratory variability, suggesting right atrial pressure of 3 mmHg. FINDINGS  Left Ventricle: Left ventricular ejection fraction, by estimation, is <20%. The left ventricle has severely decreased function. The left ventricle demonstrates  global hypokinesis. Definity contrast agent was given IV to delineate the left ventricular endocardial borders. The average left ventricular global longitudinal strain is -5.1 %. The global longitudinal strain is abnormal. The left ventricular internal cavity size was normal in size. There is mild concentric left ventricular hypertrophy. Left ventricular diastolic parameters are consistent with Grade I diastolic dysfunction (impaired relaxation). Elevated left ventricular end-diastolic pressure. Right Ventricle: The right ventricular size is normal. No increase in right ventricular wall thickness. Right ventricular systolic function is normal. There is normal pulmonary artery systolic pressure. The tricuspid regurgitant velocity is 2.15 m/s, and  with an assumed right atrial pressure of 3 mmHg, the estimated right ventricular systolic pressure is 47.4 mmHg. Left Atrium: Left atrial size was mildly dilated. Right Atrium: Right atrial size was normal in size. Pericardium: Trivial pericardial effusion is present. Mitral Valve: The mitral valve is normal in structure. Trivial mitral valve regurgitation. No evidence of mitral valve stenosis. MV peak gradient, 11.1  mmHg. The mean mitral valve gradient is 5.5 mmHg. Tricuspid Valve: The tricuspid valve is normal in structure. Tricuspid valve regurgitation is trivial. No evidence of tricuspid stenosis. Aortic Valve: The aortic valve is tricuspid. Aortic valve regurgitation is not visualized. No aortic stenosis is present. Aortic valve mean gradient measures 3.2 mmHg. Aortic valve peak gradient measures 6.2 mmHg. Pulmonic Valve: The pulmonic valve was normal in structure. Pulmonic valve regurgitation is not visualized. No evidence of pulmonic stenosis. Aorta: The aortic root is normal in size and structure. Venous: The inferior vena cava is normal in size with greater than 50% respiratory variability, suggesting right atrial pressure of 3 mmHg. IAS/Shunts: No atrial level  shunt detected by color flow Doppler.  LEFT VENTRICLE PLAX 2D LVIDd:         4.70 cm      Diastology LVIDs:         4.30 cm      LV e' medial:    4.13 cm/s LV PW:         1.40 cm      LV E/e' medial:  28.3 LV IVS:        1.10 cm      LV e' lateral:   4.80 cm/s                             LV E/e' lateral: 24.4  LV Volumes (MOD)            2D Longitudinal Strain LV vol d, MOD A2C: 145.0 ml 2D Strain GLS (A2C):   -3.8 % LV vol d, MOD A4C: 102.0 ml 2D Strain GLS (A3C):   -5.8 % LV vol s, MOD A2C: 122.0 ml 2D Strain GLS (A4C):   -5.7 % LV vol s, MOD A4C: 81.5 ml  2D Strain GLS Avg:     -5.1 % LV SV MOD A2C:     23.0 ml LV SV MOD A4C:     102.0 ml LV SV MOD BP:      22.8 ml RIGHT VENTRICLE RV Basal diam:  3.10 cm RV Mid diam:    2.50 cm RV S prime:     9.23 cm/s TAPSE (M-mode): 1.5 cm LEFT ATRIUM           Index        RIGHT ATRIUM           Index LA diam:      4.50 cm 2.35 cm/m   RA Area:     12.90 cm LA Vol (A2C): 59.4 ml 31.06 ml/m  RA Volume:   30.30 ml  15.85 ml/m LA Vol (A4C): 76.0 ml 39.74 ml/m  AORTIC VALVE                   PULMONIC VALVE AV Vmax:           124.80 cm/s PV Vmax:       0.83 m/s AV Vmean:          83.720 cm/s PV Vmean:      57.950 cm/s AV VTI:            0.183 m     PV VTI:        0.134 m AV Peak Grad:      6.2 mmHg    PV Peak grad:  2.8 mmHg AV Mean Grad:      3.2 mmHg    PV Mean grad:  1.5 mmHg LVOT Vmax:         62.00 cm/s LVOT Vmean:        41.133 cm/s LVOT VTI:          0.092 m LVOT/AV VTI ratio: 0.50  AORTA Ao Asc diam: 2.50 cm MITRAL VALVE                TRICUSPID VALVE MV Area (PHT): 3.68 cm     TR Peak grad:   18.5 mmHg MV Peak grad:  11.1 mmHg    TR Vmax:        215.00 cm/s MV Mean grad:  5.5 mmHg MV Vmax:       1.66 m/s     SHUNTS MV Vmean:      105.5 cm/s   Systemic VTI: 0.09 m MV VTI:        0.41 m MV Decel Time: 206 msec MV E velocity: 117.00 cm/s MV A velocity: 131.00 cm/s MV E/A ratio:  0.89 Skeet Latch MD Electronically signed by Skeet Latch MD Signature Date/Time:  12/20/2021/4:31:01 PM    Final    [place] EKG SITE RITE  Result Date: 12/21/2021 If Site Rite image not attached, placement could not be confirmed due to current cardiac rhythm.    Medications:     Current Medications:  aspirin EC  81 mg Oral Daily   atorvastatin  80 mg Oral Daily   clopidogrel  75 mg Oral Daily   empagliflozin  10 mg Oral Daily   sodium chloride flush  3 mL Intravenous Q12H    Infusions:  sodium chloride 10 mL/hr at 12/19/21 1744   sodium chloride        Patient Profile   51 y.o. male with hx polysubstance abuse, homelessness, CVA 02/23, HTN, recent diagnosis HFrEF and mitral stenosis.   Admitted with acute anterolateral STEMI and a/c CHF.  Assessment/Plan   Acute on chronic systolic CHF: -Echo 03/54: EF 25-30%, RV mildly reduced, RVSP 50 mmHg, moderate MS, mild to moderate MR, moderate to severe TR -TEE 02/23: EF 25-30%, RV severely reduced, severe MV stenosis (? Parachute mitral valve), moderate MR, mild TR -Anterolateral STEMI 12/19/21. LHC occlusion of apical LAD (suspect embolic vs plaque rupture with thrombosis). LVEDP 40. -Echo this admit: EF < 20%, ? RV okay, ? No MS -Suspect primary NICM - uncontrolled HTN, cocaine and alcohol abuse -Unclear if taking medications prior to admission -NYHA III. Volume okay on exam and by ReDS 31%. Diuretics stopped. - Developed hypotension and worsening renal function. Concern for low-output. Awaiting PICC placement for CVP and CO-OX monitoring. Suspect combination of diuretics and BP lowering meds may have contributed (doubt he was taking his meds prior to admission) -Tentatively planning RHC tomorrow -Agree with stopping beta blocker -Hold jardiance until renal function improves. -Off bidil with hypotension -Off entresto with hypotension and AKI -Very difficult situation. Limited options with polysubstance abuse and poor social support. Not a candidate for advanced therapies. 2. CAD/anterolateral STEMI: -LHC: 100%  apical LAD (likely thromboembolic vs plaque rupture with thrombosis). Medical management. -HS troponin > 24K -On statin, aspirin and plavix. Stop aspirin, starting anticoagulation (see below) 3. Mitral valve stenosis: -TEE 02/23: EF 25-30%, RV severely reduced, ? Parachute MV, severe MS and moderate MR -Echo this admit on Dr. Claris Gladden review: EF < 20%, RV mildly reduced, moderate MS  -Hx recurrent strep throat as a child, states not always treated with abx. Suspect rheumatic heart disease 4. Hx CVA 02/23: -Suspect likely d/t low  flow in setting of severely reduced LV function -Recommend anticoagulation with Eliquis 5 mg BID -No afib documented during recent admissions 5. HTN: -Uncontrolled on admit.  -BP soft yesterday evening after multiple meds and diuretics. Better today off medications. 6. PVCs: -Watch burden on tele -Mag 1.9. Supp. - K 4.1 6. Polysubstance abuse: -Hx cocaine and ETOH abuse -Cessation discussed 7. AKI on CKD: -Baseline ~ 1.5, 1.5 on admit>>2.0 today -In setting of diuretics and hypotension. Meds stopped as above -Concern for possible low-output. Follow co-ox once PICC placed.  SDOH: Has been homeless for > 20 years. Requesting temporary housing. Reports shelter is not an option, he has a dog.  -Has medicaid -Will consult HF TOC CSW   Length of Stay: 2  FINCH, LINDSAY N, [name]  12/21/2021, 1:47 PM  Advanced Heart Failure Team Pager [phone] (M-F; 7a - 5p)  Please contact [facility] for night-coverage after hours (4p -7a ) and weekends on [email or web address]   Patient seen with PA, agree with the above note.   Patient has history as described in above PA note.  He was admitted on 5/30 with chest pain and inferolateral STE.  He had been using cocaine within a day of admission.  Cath showed occluded apical LAD that was managed medically.  LVEDP was elevated.  Patient thought to be volume overloaded and started on Lasix gtt.  He was also started on what were listed  as his home meds: Entresto, Bidil, Coreg.  On questioning today, I suspect that he was not taking his meds or at least not taking them correctly.  Overnight last night, he developed hypotension and AKI with creatinine up 2.0 and meds were held.  Lasix gtt and NTG gtt were stopped.  He had a good diuresis overnight with net negative -3.6 L.  Currently, SBP in 100s-110s.  He feels ok, denies dyspnea or chest pain.   REDS clip 31%.    I reviewed his echo from this admission, LV EF < 20%, RV mildly decreased function, abnormal mitral valve looks rheumatic => suspect moderate mitral stenosis at least with planimetered MVA 1.6 cm^2 and calculated MVA by VTI 0.8 cm^2; mean gradient only 6 mmHg but likely function of low cardiac output. The IVC was small/non-dilated.   General: NAD Neck: No JVD, no thyromegaly or thyroid nodule.  Lungs: Clear to auscultation bilaterally with normal respiratory effort. CV: Nondisplaced PMI.  Heart regular S1/S2, no S3/S4, no murmur.  No peripheral edema.    Abdomen: Soft, nontender, no hepatosplenomegaly, no distention.  Skin: Intact without lesions or rashes.  Neurologic: Alert and oriented x 3. Right arm weakness.  Psych: Normal affect. Extremities: No clubbing or cyanosis.  HEENT: Normal.   1. Acute MI: Admission with inferolateral STEMI by ECG, cath showed occluded apical LAD.  Cannot rule out plaque rupture in setting of cocaine abuse, but given history of suspected cardioembolic CVA, I wonder if this was not a cardioembolic MI.  No further chest pain.  - Continue atorvastatin  - He is currently on ASA + Plavix, would favor transition to Plavix (post-MI) + Eliquis.  Think he should remain anticoagulated (was supposed to be on prior to admission but not sure if he was taking) given suspected cardioembolism.  2. Acute/chronic systolic CHF: Patient was found to have cardiomyopathy in 2/23 at time of admission for CVA.  Echo showed EF 25-30% at that time.  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  Echo this admission showed LV EF < 20%, RV mildly decreased function, abnormal mitral valve looks rheumatic => suspect moderate mitral stenosis at least with planimetered MVA 1.6 cm^2 and calculated MVA by VTI 0.8 cm^2; mean gradient only 6 mmHg but likely function of low cardiac output. The IVC was small/non-dilated. On conversation with him, I suspect he was not taking his medications correctly at home.  He developed AKI after starting GDMT here and getting diuresed.  Creatinine up to 2.0 and hypotensive overnight (now improved).  Echo showed a small/nondilated IVC and REDS clip was normal at 31%.  I do not think he is volume overloaded.  Suspect AKI/drop in BP is most likely due to overdiuresis and getting "home meds" that he was probably actually not taking.  However, he certainly may have low output HF.   - Stop diuretics.   - All BP-active meds have been stopped for now.  - Would place PICC line this afternoon to confirm volume status via CVP and check co-ox.  Could consider short course of milrinone to try to normalize creatinine if co-ox is low.  - +/- RHC depending on results for co-ox/CVP evaluation off PICC.  - Agree with checking lactate.  - Not a candidate for advanced therapies or home inotropes with active substance abuse (used cocaine earlier this week).  3. CVA: 2/23, has residual right-sided weakness.  Thought to be cardioembolic at the time, started on anticoagulation.   - Would resume anticoagulation prior to discharge, would use Eliquis given concerns with compliance.  4. Substance abuse: Urged him to quit cocaine.  5. 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  I reviewed this admission's echo, it shows moderate mitral stenosis at least with planimetered MVA 1.6 cm^2 and calculated MVA by VTI 0.8 cm^2; mean gradient only 6 mmHg but likely function of low  cardiac output.   - Not currently candidate for valve replacement with substance abuse and markedly low LV function.   6. Homeless.   Loralie Champagne 12/21/2021 4:02 PM

## 2021-12-22 ENCOUNTER — Encounter (HOSPITAL_COMMUNITY)
Admission: EM | Disposition: A | Discharge status: Home / Self Care | Payer: Self-pay | Source: Home / Self Care | Attending: Interventional Cardiology

## 2021-12-22 LAB — CBC
HCT: 45.5 % (ref 39.0–52.0)
Hemoglobin: 15 g/dL (ref 13.0–17.0)
MCH: 29.6 pg (ref 26.0–34.0)
MCHC: 33 g/dL (ref 30.0–36.0)
MCV: 89.7 fL (ref 80.0–100.0)
Platelets: 374 10*3/uL (ref 150–400)
RBC: 5.07 MIL/uL (ref 4.22–5.81)
RDW: 17.2 % — ABNORMAL HIGH (ref 11.5–15.5)
WBC: 10.9 10*3/uL — ABNORMAL HIGH (ref 4.0–10.5)
nRBC: 0 % (ref 0.0–0.2)

## 2021-12-22 LAB — BASIC METABOLIC PANEL
Anion gap: 14 (ref 5–15)
BUN: 29 mg/dL — ABNORMAL HIGH (ref 6–20)
CO2: 27 mmol/L (ref 22–32)
Calcium: 9.7 mg/dL (ref 8.9–10.3)
Chloride: 94 mmol/L — ABNORMAL LOW (ref 98–111)
Creatinine, Ser: 1.99 mg/dL — ABNORMAL HIGH (ref 0.61–1.24)
GFR, Estimated: 40 mL/min — ABNORMAL LOW (ref >60)
Glucose, Bld: 176 mg/dL — ABNORMAL HIGH (ref 70–99)
Potassium: 3.6 mmol/L (ref 3.5–5.1)
Sodium: 135 mmol/L (ref 135–145)

## 2021-12-22 LAB — COOXEMETRY PANEL
Carboxyhemoglobin: 1.6 % — ABNORMAL HIGH (ref 0.5–1.5)
Methemoglobin: <0.7 % (ref 0.0–1.5)
O2 Saturation: 67.7 %
Total hemoglobin: 15.3 g/dL (ref 12.0–16.0)

## 2021-12-22 SURGERY — RIGHT HEART CATH

## 2021-12-22 MED ORDER — POTASSIUM CHLORIDE CRYS ER 20 MEQ PO TBCR
20.0000 meq | EXTENDED_RELEASE_TABLET | Freq: Once | ORAL | Status: AC
  Administered 2021-12-22: 20 meq via ORAL
  Filled 2021-12-22: qty 1

## 2021-12-22 MED ORDER — SODIUM CHLORIDE 0.9 % IV SOLN: INTRAVENOUS | Status: DC

## 2021-12-22 NOTE — Progress Notes (Signed)
Pt now expressing he wants to refuse his RHC scheduled for this afternoon. He is requesting a physician come and talk to him to explain the reasoning behind this procedure again. I messaged Rennis Harding, RN and Dr. Tamala Julian about Mr. Miloh's refusal.

## 2021-12-22 NOTE — TOC Progression Note (Addendum)
Transition of Care [facility]) - Progression Note    Patient Details  Name: Craig Schmidt MRN: [id number] Date of Birth: 1971-03-10  Transition of Care [facility]) CM/SW Contact  Kylee Nardozzi, LCSW Phone Number: 12/22/2021, 9:30 AM  Clinical Narrative:    Consult in for Mr. Mobley substance use and living situation however the unit CSW spoke with Mr. Loper on 12/20/21 and I cannot offer anything more than what she already has for Mr. Weisinger. Barriers for Mr. Gilson include age and having a pet as shelters do not allow animals.  CSW will continue to follow throughout discharge.          Expected Discharge Plan and Services                                                 Social Determinants of Health (SDOH) Interventions    Readmission Risk Interventions     View : No data to display.

## 2021-12-22 NOTE — Progress Notes (Signed)
CARDIAC REHAB PHASE I   PRE:  Rate/Rhythm: 99 SR with PVC  BP:  Sitting: 114/95     MODE:  Ambulation: pivot to chair  POST:  Rate/Rhythm: 107 ST with PVCs   Offered to walk with pt. Pt hesitant to walk, but agreeable to get up to chair. Pt mod assist to stand and pivot to recliner. Pt given MI Booklet. Call bell, phone, and bedside table within reach. Will continue to follow and encourage ambulation as able.  [id number] Rufina Falco, RN BSN 12/22/2021 12:05 PM

## 2021-12-22 NOTE — Progress Notes (Addendum)
Advanced Heart Failure Rounding Note  PCP-Cardiologist: Janina Mayo, MD   Subjective:    Currently CP Free. Denies dyspnea. Main compliant is poor sleep last night. Asking for sleep aid.  Refusing RHC.   PICC placed yesterday. Initial Co-ox 75%. 68% today   Lactate 1.5   No further hypotension overnight. SCr remains elevated, 1.99 today. Diuretics on hold. CVP 2    Objective:   Weight Range: 72 kg Body mass index is 25.61 kg/m.   Vital Signs:   Temp:  [98.2 F (36.8 C)-99 F (37.2 C)] 98.3 F (36.8 C) (06/02 0401) Pulse Rate:  [47-103] 93 (06/02 0401) Resp:  [15-20] 20 (06/02 0401) BP: (98-128)/(65-95) 126/87 (06/02 0401) SpO2:  [92 %-97 %] 94 % (06/02 0401) Weight:  [72 kg] 72 kg (06/02 0401) Last BM Date : 12/21/21  Weight change: Filed Weights   12/19/21 1807 12/22/21 0401  Weight: 81.6 kg 72 kg    Intake/Output:   Intake/Output Summary (Last 24 hours) at 12/22/2021 0746 Last data filed at 12/22/2021 0459 Gross per 24 hour  Intake 973.38 ml  Output 825 ml  Net 148.38 ml      Physical Exam    CVP 2  General:  disheveled appearing thin male, looks older than age. No resp difficulty HEENT: Normal Neck: Supple. JVP not elevated . Carotids 2+ bilat; no bruits. No lymphadenopathy or thyromegaly appreciated. Cor: PMI nondisplaced. Regular rate & rhythm. 2/6 MR murmur  Lungs: Clear Abdomen: Soft, nontender, nondistended. No hepatosplenomegaly. No bruits or masses. Good bowel sounds. Extremities: No cyanosis, clubbing, rash, edema + LUE PICC  Neuro: Alert & orientedx3, cranial nerves grossly intact. moves all 4 extremities w/o difficulty. Affect pleasant   Telemetry   NSR 90s   EKG    N/A   Labs    CBC Recent Labs    12/19/21 1727 12/19/21 1826 12/22/21 0500  WBC 10.4  --  10.9*  NEUTROABS 7.9*  --   --   HGB 12.9* 13.6 15.0  HCT 41.1 40.0 45.5  MCV 93.2  --  89.7  PLT 299  --  277   Basic Metabolic Panel Recent Labs     12/20/21 1633 12/21/21 0236 12/21/21 1752 12/21/21 2144 12/22/21 0500  NA 136 136   < > 135 135  K 3.8 4.1   < > 3.9 3.6  CL 98 97*   < > 95* 94*  CO2 25 25   < > 27 27  GLUCOSE 160* 120*   < > 107* 176*  BUN 19 22*   < > 28* 29*  CREATININE 1.87* 2.00*   < > 2.04* 1.99*  CALCIUM 9.5 9.7   < > 9.8 9.7  MG 1.9 1.9  --   --   --    < > = values in this interval not displayed.   Liver Function Tests Recent Labs    12/20/21 1633 12/21/21 1752  AST 166* 62*  ALT 17 14  ALKPHOS 97 85  BILITOT 0.9 0.6  PROT 7.2 6.8  ALBUMIN 3.4* 3.0*   No results for input(s): LIPASE, AMYLASE in the last 72 hours. Cardiac Enzymes No results for input(s): CKTOTAL, CKMB, CKMBINDEX, TROPONINI in the last 72 hours.  BNP: BNP (last 3 results) Recent Labs    09/10/21 0400 12/19/21 1929  BNP 1,570.7* 809.0*    ProBNP (last 3 results) No results for input(s): PROBNP in the last 8760 hours.   D-Dimer No results for input(s):  DDIMER in the last 72 hours. Hemoglobin A1C Recent Labs    12/19/21 1727  HGBA1C 5.6   Fasting Lipid Panel Recent Labs    12/19/21 1727  CHOL 166  HDL 61  LDLCALC 95  TRIG 48  CHOLHDL 2.7   Thyroid Function Tests No results for input(s): TSH, T4TOTAL, T3FREE, THYROIDAB in the last 72 hours.  Invalid input(s): FREET3  Other results:   Imaging    [place] EKG SITE RITE  Result Date: 12/21/2021 If Site Rite image not attached, placement could not be confirmed due to current cardiac rhythm.    Medications:     Scheduled Medications:  apixaban  5 mg Oral BID   atorvastatin  80 mg Oral Daily   Chlorhexidine Gluconate Cloth  6 each Topical Daily   clopidogrel  75 mg Oral Daily   sodium chloride flush  10-40 mL Intracatheter Q12H   sodium chloride flush  3 mL Intravenous Q12H    Infusions:  sodium chloride 10 mL/hr at 12/19/21 1744   sodium chloride     sodium chloride      PRN Medications: sodium chloride, acetaminophen, ondansetron (ZOFRAN)  IV, polyvinyl alcohol, sodium chloride flush, sodium chloride flush    Patient Profile   51 y.o. male with hx polysubstance abuse, homelessness, CVA 02/23, HTN, recent diagnosis HFrEF and mitral stenosis.    Admitted with acute anterolateral STEMI and a/c CHF  Assessment/Plan   1. Acute MI: Admission with inferolateral STEMI by ECG, cath showed occluded apical LAD.  Cannot rule out plaque rupture in setting of cocaine abuse, but given history of suspected cardioembolic CVA, I wonder if this was not a cardioembolic MI.  No further chest pain.  - Continue atorvastatin  - He is currently on ASA + Plavix, would favor transition to Plavix (post-MI) + Eliquis.  Think he should remain anticoagulated (was supposed to be on prior to admission but not sure if he was taking) given suspected cardioembolism.  2. Acute/chronic systolic CHF: Patient was found to have cardiomyopathy in 2/23 at time of admission for CVA.  Echo showed EF 25-30% at that time.  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  Echo this admission showed LV EF < 20%, RV mildly decreased function, abnormal mitral valve looks rheumatic => suspect moderate mitral stenosis at least with planimetered MVA 1.6 cm^2 and calculated MVA by VTI 0.8 cm^2; mean gradient only 6 mmHg but likely function of low cardiac output. The IVC was small/non-dilated. On conversation with him, I suspect he was not taking his medications correctly at home.  He developed AKI after starting GDMT here and getting diuresed.  Creatinine up to 2.0 and hypotensive overnight (now improved).  Echo showed a small/nondilated IVC and REDS clip was normal at 31%.  I do not think he is volume overloaded.  Suspect AKI/drop in BP is most likely due to overdiuresis and getting "home meds" that he was probably actually not taking. Not in low output state, co-ox 68%. Lactic acid 1.5. Volume status low, CVP 2    - continue to hold  diuretics and BP active meds for now   - Not a candidate for advanced therapies or home inotropes with active substance abuse (used cocaine earlier this week).  3. CVA: 2/23, has residual right-sided weakness.  Thought to be cardioembolic at the time, started on anticoagulation.   - Would resume anticoagulation prior to discharge, would use Eliquis given concerns with compliance.  4. Substance abuse: Urged him to quit cocaine.  5. 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  I reviewed this admission's echo, it shows moderate mitral stenosis at least with planimetered MVA 1.6 cm^2 and calculated MVA by VTI 0.8 cm^2; mean gradient only 6 mmHg but likely function of low cardiac output.   - Not currently candidate for valve replacement with substance abuse and markedly low LV function.   6. Homeless.    Length of Stay: [address] [place]  12/22/2021, 7:46 AM  Advanced Heart Failure Team Pager [phone] (M-F; 7a - 5p)  Please contact [facility] for night-coverage after hours (5p -7a ) and weekends on [email or web address]  Patient seen with PA, agree with the above note.   PICC placed last night, CVP 2 this morning with co-ox 68%.  Lactate was normal.  Creatinine down to 1.99. He is refusing RHC.    Denies dyspnea, chest pain.   General: NAD Neck: No JVD, no thyromegaly or thyroid nodule.  Lungs: Clear to auscultation bilaterally with normal respiratory effort. CV: Lateral PMI.  Heart regular S1/S2, no S3/S4, no murmur.  No peripheral edema.   Abdomen: Soft, nontender, no hepatosplenomegaly, no distention.  Skin: Intact without lesions or rashes.  Neurologic: Alert and oriented x 3.  Psych: Normal affect. Extremities: No clubbing or cyanosis.  HEENT: Normal.   Suspect AKI/drop in BP is most likely due to overdiuresis and getting "home meds" that he was probably actually not taking. Not in low output state, co-ox 68%. CVP is only 2.  Creatinine mildly  better at 1.99.  - Encourage po intake today.  - Will likely start low dose Bidil tomorrow, med holiday today.  - Think we can hold off on RHC with PICC numbers (and he refuses).   - Long-term, very concerned about compliance with active cocaine abuse.  - Not candidate for advanced therapies or ICD with cocaine abuse.   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  Unusual given age.  Hemodynamic LHC/RHC eventually would be helpful for full evaluation of mitral stenosis but he is refusing today and not currently a good surgical candidate with severe biventricular failure (though will need to review prior TEE for valvuloplasty candidacy).   MI with distal LAD occlusion, ?cardioembolic given severe LV dysfunction and prior concern for cardioembolic CVA.  Would treat for now with Plavix/Eliquis combination and drop ASA.   Loralie Champagne 12/22/2021 8:43 AM

## 2021-12-23 LAB — BASIC METABOLIC PANEL
Anion gap: 10 (ref 5–15)
BUN: 33 mg/dL — ABNORMAL HIGH (ref 6–20)
CO2: 28 mmol/L (ref 22–32)
Calcium: 9.7 mg/dL (ref 8.9–10.3)
Chloride: 96 mmol/L — ABNORMAL LOW (ref 98–111)
Creatinine, Ser: 1.81 mg/dL — ABNORMAL HIGH (ref 0.61–1.24)
GFR, Estimated: 45 mL/min — ABNORMAL LOW (ref >60)
Glucose, Bld: 129 mg/dL — ABNORMAL HIGH (ref 70–99)
Potassium: 4.5 mmol/L (ref 3.5–5.1)
Sodium: 134 mmol/L — ABNORMAL LOW (ref 135–145)

## 2021-12-23 LAB — COOXEMETRY PANEL
Carboxyhemoglobin: 1.4 % (ref 0.5–1.5)
Methemoglobin: <0.7 % (ref 0.0–1.5)
O2 Saturation: 61.1 %
Total hemoglobin: 15 g/dL (ref 12.0–16.0)

## 2021-12-23 MED ORDER — ISOSORB DINITRATE-HYDRALAZINE 20-37.5 MG PO TABS
0.5000 | ORAL_TABLET | Freq: Three times a day (TID) | ORAL | Status: DC
  Administered 2021-12-23 – 2021-12-24 (×6): 0.5 via ORAL
  Filled 2021-12-23 (×7): qty 1

## 2021-12-23 MED ORDER — DIGOXIN 125 MCG PO TABS
0.1250 mg | ORAL_TABLET | Freq: Every day | ORAL | Status: DC
  Administered 2021-12-23 – 2021-12-27 (×5): 0.125 mg via ORAL
  Filled 2021-12-23 (×5): qty 1

## 2021-12-23 MED ORDER — NICOTINE 7 MG/24HR TD PT24
7.0000 mg | MEDICATED_PATCH | Freq: Every day | TRANSDERMAL | Status: DC
  Administered 2021-12-23 – 2021-12-27 (×5): 7 mg via TRANSDERMAL
  Filled 2021-12-23 (×5): qty 1

## 2021-12-23 NOTE — Evaluation (Signed)
Physical Therapy Evaluation Patient Details Name: Craig Schmidt MRN: [id number] DOB: May 13, 1971 Today's Date: 12/23/2021  History of Present Illness  51 y.o. male presents to [facility] hospital on 12/19/2021 with chest pain. EKG with ST elevation in ED. Pt underwent cardiac cath on 5/30, demonstrating occlusion of apical LAD, severe systolic dysfunction with EF less than 25%. Pt refusing right heart cath 6/2. 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  Clinical Impression  Pt presents to PT with deficits in vision, vestibular function, balance, strength, power, endurance. Pt reports a recent history of falls, all preceded by dizziness.  Pt also reports blurred and double vision. Currently the pt is able to ambulate with use of an assistive device without significant distraction of challenges to balance. The patient will need to be independent when mobilizing at a community level, including maintaining balance with head turns and quick changes in direction. Pt will benefit from vestibular therapies and gait/balance training in order to reduce falls risk.      Recommendations for follow up therapy are one component of a multi-disciplinary discharge planning process, led by the attending physician.  Recommendations may be updated based on patient status, additional functional criteria and insurance authorization.  Follow Up Recommendations Outpatient PT (outpatient vestibular rehab)    Assistance Recommended at Discharge PRN  Patient can return home with the following  A little help with walking and/or transfers;A little help with bathing/dressing/bathroom;Assistance with cooking/housework;Assist for transportation;Help with stairs or ramp for entrance    Equipment Recommendations Other (comment) (hemiwalker)  Recommendations for Other Services       Functional Status Assessment Patient has had a recent  decline in their functional status and demonstrates the ability to make significant improvements in function in a reasonable and predictable amount of time.     Precautions / Restrictions Precautions Precautions: Fall Precaution Comments: cerebellar infarcts and vestibular schwannoma Restrictions Weight Bearing Restrictions: No      Mobility  Bed Mobility Overal bed mobility: Needs Assistance Bed Mobility: Supine to Sit, Sit to Supine     Supine to sit: Supervision, HOB elevated Sit to supine: Supervision, HOB elevated   General bed mobility comments: increased time    Transfers Overall transfer level: Needs assistance Equipment used: None Transfers: Sit to/from Stand Sit to Stand: Supervision                Ambulation/Gait Ambulation/Gait assistance: Supervision Gait Distance (Feet): 150 Feet Assistive device: Rolling walker (2 wheels) (utilizing RW to simulate hemiwalker, on left side only) Gait Pattern/deviations: Step-through pattern, Decreased stride length Gait velocity: reduced Gait velocity interpretation: <1.8 ft/sec, indicate of risk for recurrent falls   General Gait Details: pt with slowed step-through gait, reduced stride length, no considerable losses of balance noted  Stairs            Wheelchair Mobility    Modified Rankin (Stroke Patients Only)       Balance Overall balance assessment: Needs assistance Sitting-balance support: No upper extremity supported, Feet supported Sitting balance-Leahy Scale: Good     Standing balance support: No upper extremity supported, During functional activity Standing balance-Leahy Scale: Fair                               Pertinent Vitals/Pain Pain Assessment Pain Assessment: Faces Faces Pain Scale: Hurts a little bit Pain Location: R shoulder Pain Descriptors / Indicators:  Grimacing Pain Intervention(s): Monitored during session    Home Living Family/patient expects to be  discharged to:: Shelter/Homeless                   Additional Comments: Pt is homeless and has been for ~30 years per his report    Prior Function Prior Level of Function : Independent/Modified Independent             Mobility Comments: Walking with a SPC, but admits to 3 recent falls and poor dynamic balance ADLs Comments: R UE is not functional.  He has limited grip and little to no AROM to elbow and shoulder.  He is able to complete ADL, but is not as thorough as he needs to be.     Hand Dominance   Dominant Hand: Left (due to deficits from CVA in march)    Extremity/Trunk Assessment   Upper Extremity Assessment Upper Extremity Assessment: Defer to OT evaluation RUE Deficits / Details: L CVA with deficts to R UE.  Limited hand function, unable to touch mouth, elbow in flexed position 90 degress, R shoulder sublux with little to no AROM. RUE: Shoulder pain with ROM RUE Sensation: decreased light touch RUE Coordination: decreased fine motor;decreased gross motor    Lower Extremity Assessment Lower Extremity Assessment: RLE deficits/detail RLE Deficits / Details: grossly 4+/5, ROM WFL RLE Sensation: decreased light touch    Cervical / Trunk Assessment Cervical / Trunk Assessment: Normal  Communication   Communication: No difficulties  Cognition Arousal/Alertness: Awake/alert Behavior During Therapy: WFL for tasks assessed/performed Overall Cognitive Status: Within Functional Limits for tasks assessed                                          General Comments General comments (skin integrity, edema, etc.): tachy into 110s, otherwise VSS, pt does not report SOB. Pt reporting blurred and double vision since CVA, difficulty maintaining focus during assessment of pursuits, saccades, VOR horizontal.    Exercises     Assessment/Plan    PT Assessment Patient needs continued PT services  PT Problem List Decreased balance;Decreased  strength;Decreased range of motion;Decreased activity tolerance;Decreased mobility;Decreased knowledge of use of DME;Decreased knowledge of precautions;Decreased safety awareness;Impaired sensation;Cardiopulmonary status limiting activity;Impaired tone       PT Treatment Interventions DME instruction;Gait training;Stair training;Functional mobility training;Therapeutic activities;Therapeutic exercise;Balance training;Neuromuscular re-education;Cognitive remediation;Patient/family education;Wheelchair mobility training    PT Goals (Current goals can be found in the Care Plan section)  Acute Rehab PT Goals Patient Stated Goal: to reduce falls risk PT Goal Formulation: With patient Time For Goal Achievement: 01/06/22 Potential to Achieve Goals: Fair Additional Goals Additional Goal #1: Pt will perform vestibular HEP and utilize compensatory strategies to manage vestibular deficits independently to aide in reducing dizziness and falls risk    Frequency Min 5X/week     Co-evaluation               AM-PAC PT "6 Clicks" Mobility  Outcome Measure Help needed turning from your back to your side while in a flat bed without using bedrails?: A Little Help needed moving from lying on your back to sitting on the side of a flat bed without using bedrails?: A Little Help needed moving to and from a bed to a chair (including a wheelchair)?: A Little Help needed standing up from a chair using your arms (e.g., wheelchair or bedside chair)?: A  Little Help needed to walk in hospital room?: A Little Help needed climbing 3-5 steps with a railing? : Total 6 Click Score: 16    End of Session   Activity Tolerance: Patient tolerated treatment well Patient left: in bed;with call bell/phone within reach;with bed alarm set Nurse Communication: Mobility status PT Visit Diagnosis: History of falling (Z91.81);Other symptoms and signs involving the nervous system (R29.898)    Time: 2481-8590 PT Time  Calculation (min) (ACUTE ONLY): 37 min   Charges:   PT Evaluation $PT Eval Moderate Complexity: 1 Mod          Zenaida Niece, PT, DPT Acute Rehabilitation Office [phone]   Zenaida Niece 12/23/2021, 6:20 PM

## 2021-12-23 NOTE — Progress Notes (Signed)
Offered mobility/ambulation. Pt sts he had chest tightness/burning in chair yesterday and had to go back to bed. Sts he is only somewhat comfortable in bed on his side. Sts currently his pain is 5/10. Sts it feels the same type of pain as on admit but less. He sts it is worse with deep breaths. Declines any mobility. Reviewed MI, NTG (sts he has not had sublingual), smoking cessation. Pt is not interested in smoking cessation. Left material. Will refer to [place] to meet protocol. [id number] [place], ACSM 12/23/2021 1:15 PM

## 2021-12-23 NOTE — Progress Notes (Signed)
Patient ID: Craig Schmidt, male   DOB: 1971/04/01, 51 y.o.   MRN: [id number]     Advanced Heart Failure Rounding Note  PCP-Cardiologist: Janina Mayo, MD   Subjective:    No chest pain or dyspnea.  Says he is "tired."  Creatinine down to 1.81.  CVP 2, co-ox 61%.    Objective:   Weight Range: 72.2 kg Body mass index is 25.69 kg/m.   Vital Signs:   Temp:  [98 F (36.7 C)-98.8 F (37.1 C)] 98.5 F (36.9 C) (06/03 0750) Pulse Rate:  [45-98] 90 (06/03 0750) Resp:  [14-23] 20 (06/03 0750) BP: (90-123)/(63-98) 123/94 (06/03 0750) SpO2:  [94 %-99 %] 97 % (06/03 0750) Weight:  [72.2 kg] 72.2 kg (06/03 0534) Last BM Date : 12/22/21  Weight change: Filed Weights   12/19/21 1807 12/22/21 0401 12/23/21 0534  Weight: 81.6 kg 72 kg 72.2 kg    Intake/Output:   Intake/Output Summary (Last 24 hours) at 12/23/2021 0918 Last data filed at 12/23/2021 0536 Gross per 24 hour  Intake 610 ml  Output 750 ml  Net -140 ml      Physical Exam    CVP 2  General: NAD Neck: No JVD, no thyromegaly or thyroid nodule.  Lungs: Clear to auscultation bilaterally with normal respiratory effort. CV: Lateral PMI.  Heart regular S1/S2, no S3/S4, 1/6 HSM apex.  No peripheral edema.   Abdomen: Soft, nontender, no hepatosplenomegaly, no distention.  Skin: Intact without lesions or rashes.  Neurologic: Alert and oriented x 3.  Psych: Normal affect. Extremities: No clubbing or cyanosis.  HEENT: Normal.   Telemetry   NSR 80s   EKG    N/A   Labs    CBC Recent Labs    12/22/21 0500  WBC 10.9*  HGB 15.0  HCT 45.5  MCV 89.7  PLT 659   Basic Metabolic Panel Recent Labs    12/20/21 1633 12/21/21 0236 12/21/21 1752 12/22/21 0500 12/23/21 0358  NA 136 136   < > 135 134*  K 3.8 4.1   < > 3.6 4.5  CL 98 97*   < > 94* 96*  CO2 25 25   < > 27 28  GLUCOSE 160* 120*   < > 176* 129*  BUN 19 22*   < > 29* 33*  CREATININE 1.87* 2.00*   < > 1.99* 1.81*  CALCIUM 9.5 9.7   < > 9.7 9.7  MG  1.9 1.9  --   --   --    < > = values in this interval not displayed.   Liver Function Tests Recent Labs    12/20/21 1633 12/21/21 1752  AST 166* 62*  ALT 17 14  ALKPHOS 97 85  BILITOT 0.9 0.6  PROT 7.2 6.8  ALBUMIN 3.4* 3.0*   No results for input(s): LIPASE, AMYLASE in the last 72 hours. Cardiac Enzymes No results for input(s): CKTOTAL, CKMB, CKMBINDEX, TROPONINI in the last 72 hours.  BNP: BNP (last 3 results) Recent Labs    09/10/21 0400 12/19/21 1929  BNP 1,570.7* 809.0*    ProBNP (last 3 results) No results for input(s): PROBNP in the last 8760 hours.   D-Dimer No results for input(s): DDIMER in the last 72 hours. Hemoglobin A1C No results for input(s): HGBA1C in the last 72 hours.  Fasting Lipid Panel No results for input(s): CHOL, HDL, LDLCALC, TRIG, CHOLHDL, LDLDIRECT in the last 72 hours.  Thyroid Function Tests No results for input(s): TSH, T4TOTAL, T3FREE,  THYROIDAB in the last 72 hours.  Invalid input(s): FREET3  Other results:   Imaging    No results found.   Medications:     Scheduled Medications:  apixaban  5 mg Oral BID   atorvastatin  80 mg Oral Daily   Chlorhexidine Gluconate Cloth  6 each Topical Daily   clopidogrel  75 mg Oral Daily   digoxin  0.125 mg Oral Daily   isosorbide-hydrALAZINE  0.5 tablet Oral TID   sodium chloride flush  10-40 mL Intracatheter Q12H   sodium chloride flush  3 mL Intravenous Q12H    Infusions:  sodium chloride 10 mL/hr at 12/19/21 1744   sodium chloride     sodium chloride      PRN Medications: sodium chloride, acetaminophen, ondansetron (ZOFRAN) IV, polyvinyl alcohol, sodium chloride flush, sodium chloride flush    Patient Profile   51 y.o. male with hx polysubstance abuse, homelessness, CVA 02/23, HTN, recent diagnosis HFrEF and mitral stenosis.    Admitted with acute anterolateral STEMI and a/c CHF  Assessment/Plan   1. Acute MI: Admission with inferolateral STEMI by ECG, cath  showed occluded apical LAD.  Cannot rule out plaque rupture in setting of cocaine abuse, but given history of suspected cardioembolic CVA, I wonder if this was not a cardioembolic MI.  No further chest pain.  - Continue atorvastatin  - Continue Plavix (post-MI) + Eliquis.  Think he should remain anticoagulated (was supposed to be on prior to admission but not sure if he was taking) given suspected cardioembolism.  2. Acute/chronic systolic CHF: Patient was found to have cardiomyopathy in 2/23 at time of admission for CVA.  Echo showed EF 25-30% at that time.  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  Echo this admission showed LV EF < 20%, RV mildly decreased function, abnormal mitral valve looks rheumatic => suspect moderate mitral stenosis at least with planimetered MVA 1.6 cm^2 and calculated MVA by VTI 0.8 cm^2; mean gradient only 6 mmHg but likely function of low cardiac output. The IVC was small/non-dilated. On conversation with him, I suspect he was not taking his medications correctly at home.  He developed AKI after starting GDMT here and getting diuresed.  Creatinine up to 2.0 and hypotensive (now improved).  Echo showed a small/nondilated IVC and REDS clip was normal at 31%.  I do not think he is volume overloaded.  Suspect AKI/drop in BP is most likely due to overdiuresis and getting "home meds" that he was probably actually not taking. Not in low output state, co-ox 61% today. Volume status low, CVP 2. SBP 110s.  Creatinine lower at 1.81.  - Will add digoxin 0.125 today.  - Will add Bidil 0.5 tid today.  - Not a candidate for advanced therapies or home inotropes with active substance abuse (used cocaine earlier this week).  3. CVA: 2/23, has residual right-sided weakness.  Thought to be cardioembolic at the time, started on anticoagulation.   - Use Eliquis given concerns with compliance with warfarin INR checks.  4. Substance abuse:  Urged him to quit cocaine.  5. 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  I reviewed this admission's echo, it shows moderate mitral stenosis at least with planimetered MVA 1.6 cm^2 and calculated MVA by VTI 0.8 cm^2; mean gradient only 6 mmHg but possibly function of low cardiac output.  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  Unusual given  age.  Hemodynamic LHC/RHC eventually would be helpful for full evaluation of mitral stenosis but he is refusing today and not currently a good surgical candidate with severe biventricular failure (though will need to review prior TEE for valvuloplasty candidacy).  - Not currently candidate for valve replacement with substance abuse and markedly low LV function.   6. Homeless.   Will keep today, make sure he tolerates meds.  Try to add [place] tomorrow and hopefully discharge.   Length of Stay: 4  Loralie Champagne, MD  12/23/2021, 9:18 AM  Advanced Heart Failure Team Pager [phone] (M-F; 7a - 5p)  Please contact [facility] for night-coverage after hours (5p -7a ) and weekends on [email or web address]

## 2021-12-23 NOTE — Evaluation (Signed)
Occupational Therapy Evaluation Patient Details Name: Craig Schmidt MRN: [id number] DOB: 1971-02-02 Today's Date: 12/23/2021   History of Present Illness 51 y.o. male presents to [facility] hospital on 12/19/2021 with chest pain. EKG with ST elevation in ED. Pt underwent cardiac cath on 5/30, demonstrating occlusion of apical LAD, severe systolic dysfunction with EF less than 25%. Pt refusing right heart cath 6/2.   Clinical Impression   Patient admitted for the diagnosis above.  PTA he is homeless living in the streets.  His R arm dysfunction is post CVA in March 2023 and chronic.  He is most likely at his baseline, but OT can follow in the acute setting to assist with HEP and assist with any DME and splinting needs to prevent contractures.  Unclear what options he has for post acute housing.  Homeless shelter versus group home versus LTC at [facility].        Recommendations for follow up therapy are one component of a multi-disciplinary discharge planning process, led by the attending physician.  Recommendations may be updated based on patient status, additional functional criteria and insurance authorization.   Follow Up Recommendations  Other (comment) (Group home versus shelter)    Assistance Recommended at Discharge Intermittent Supervision/Assistance  Patient can return home with the following A little help with bathing/dressing/bathroom;Assist for transportation    Functional Status Assessment  Patient has not had a recent decline in their functional status  Equipment Recommendations  None recommended by OT    Recommendations for Other Services       Precautions / Restrictions Precautions Precautions: Fall Restrictions Weight Bearing Restrictions: No      Mobility Bed Mobility Overal bed mobility: Needs Assistance Bed Mobility: Supine to Sit     Supine to sit: Min guard, Min assist          Transfers Overall transfer level: Needs assistance   Transfers: Sit to/from  Stand, Bed to chair/wheelchair/BSC Sit to Stand: Supervision     Step pivot transfers: Supervision            Balance Overall balance assessment: Needs assistance Sitting-balance support: Feet supported Sitting balance-Leahy Scale: Good     Standing balance support: No upper extremity supported Standing balance-Leahy Scale: Fair                             ADL either performed or assessed with clinical judgement   ADL       Grooming: Wash/dry hands;Wash/dry face;Set up;Sitting           Upper Body Dressing : Set up;Sitting   Lower Body Dressing: Min guard;Sit to/from stand   Toilet Transfer: Supervision/safety;Stand-pivot;BSC/3in1                   Vision Patient Visual Report: Blurring of vision       Perception Perception Perception: Within Functional Limits   Praxis Praxis Praxis: Intact    Pertinent Vitals/Pain Pain Assessment Pain Assessment: Faces Faces Pain Scale: Hurts little more Pain Location: R shoulder and elbow Pain Descriptors / Indicators: Grimacing, Guarding, Sharp Pain Intervention(s): Monitored during session     Hand Dominance Left (L CVA with RUE deficits)   Extremity/Trunk Assessment Upper Extremity Assessment Upper Extremity Assessment: RUE deficits/detail RUE Deficits / Details: L CVA with deficts to R UE.  Limited hand function, unable to touch mouth, elbow in flexed position 90 degress, R shoulder sublux with little to no AROM. RUE: Shoulder pain  with ROM RUE Sensation: decreased light touch RUE Coordination: decreased fine motor;decreased gross motor   Lower Extremity Assessment Lower Extremity Assessment: Defer to PT evaluation   Cervical / Trunk Assessment Cervical / Trunk Assessment: Kyphotic   Communication Communication Communication: No difficulties   Cognition Arousal/Alertness: Awake/alert Behavior During Therapy: WFL for tasks assessed/performed Overall Cognitive Status: Within  Functional Limits for tasks assessed                                       General Comments   A little tachycardic.  HR up to 103.      Exercises     Shoulder Instructions      Home Living Family/patient expects to be discharged to:: Shelter/Homeless                                 Additional Comments: Pt is homeless and has been for ~30 years per his report      Prior Functioning/Environment               Mobility Comments: Walking with a SPC, but admits to increasing falls and poor dynamic balance. ADLs Comments: R UE is not functional.  He has limited grip and little to no AROM to elbow and shoulder.  He is able to complete ADL, but is not as thorough as he needs to be.        OT Problem List: Decreased strength;Decreased range of motion;Impaired balance (sitting and/or standing);Impaired UE functional use;Pain      OT Treatment/Interventions: Self-care/ADL training;Therapeutic exercise;Therapeutic activities;Patient/family education;Balance training    OT Goals(Current goals can be found in the care plan section) Acute Rehab OT Goals Patient Stated Goal: I need a place to live OT Goal Formulation: With patient Time For Goal Achievement: 01/05/22 Potential to Achieve Goals: Good ADL Goals Pt Will Perform Grooming: with set-up;standing Pt Will Perform Lower Body Dressing: with modified independence;sit to/from stand Pt Will Transfer to Toilet: with modified independence;ambulating;regular height toilet Pt/caregiver will Perform Home Exercise Program: Increased ROM;Right Upper extremity;With minimal assist  OT Frequency: Min 2X/week    Co-evaluation              AM-PAC OT "6 Clicks" Daily Activity     Outcome Measure Help from another person eating meals?: None Help from another person taking care of personal grooming?: A Little Help from another person toileting, which includes using toliet, bedpan, or urinal?: A Little Help  from another person bathing (including washing, rinsing, drying)?: A Little Help from another person to put on and taking off regular upper body clothing?: None Help from another person to put on and taking off regular lower body clothing?: A Little 6 Click Score: 20   End of Session Nurse Communication: Mobility status  Activity Tolerance: Patient tolerated treatment well Patient left: in chair;with call bell/phone within reach;with nursing/sitter in room  OT Visit Diagnosis: Unsteadiness on feet (R26.81);Pain;Hemiplegia and hemiparesis;History of falling (Z91.81) Hemiplegia - Right/Left: Right Hemiplegia - dominant/non-dominant: Dominant Hemiplegia - caused by: Cerebral infarction Pain - Right/Left: Right Pain - part of body: Shoulder;Arm                Time: 0623-7628 OT Time Calculation (min): 23 min Charges:  OT General Charges $OT Visit: 1 Visit OT Evaluation $OT Eval Moderate Complexity: 1 Mod OT Treatments $Self  Care/Home Management : 8-22 mins  12/23/2021  RP, OTR/L  Acute Rehabilitation Services  Office:  [place] 12/23/2021, 5:12 PM

## 2021-12-23 NOTE — Progress Notes (Signed)
Patients sister into see patient and stated concerns about patient going home tomorrow with continued concerns about chest pain and shortness of breathe. Discussed that patient has a very weak heart. Weaker than last admission. He needs to move around or he will become weaker. Sister stated he was up in chair yesterday and needed to go back to bed because of feeling short of breath and having chest pain. Discussed concerns and will notify provider to contact sister and review patient plan of care.

## 2021-12-24 LAB — BASIC METABOLIC PANEL
Anion gap: 7 (ref 5–15)
BUN: 35 mg/dL — ABNORMAL HIGH (ref 6–20)
CO2: 26 mmol/L (ref 22–32)
Calcium: 9.4 mg/dL (ref 8.9–10.3)
Chloride: 100 mmol/L (ref 98–111)
Creatinine, Ser: 1.67 mg/dL — ABNORMAL HIGH (ref 0.61–1.24)
GFR, Estimated: 50 mL/min — ABNORMAL LOW (ref >60)
Glucose, Bld: 128 mg/dL — ABNORMAL HIGH (ref 70–99)
Potassium: 4.9 mmol/L (ref 3.5–5.1)
Sodium: 133 mmol/L — ABNORMAL LOW (ref 135–145)

## 2021-12-24 LAB — COOXEMETRY PANEL
Carboxyhemoglobin: 1.4 % (ref 0.5–1.5)
Methemoglobin: 0.7 % (ref 0.0–1.5)
O2 Saturation: 71.2 %
Total hemoglobin: 14.5 g/dL (ref 12.0–16.0)

## 2021-12-24 MED ORDER — DAPAGLIFLOZIN PROPANEDIOL 10 MG PO TABS
10.0000 mg | ORAL_TABLET | Freq: Every day | ORAL | Status: DC
  Administered 2021-12-24 – 2021-12-25 (×2): 10 mg via ORAL
  Filled 2021-12-24 (×2): qty 1

## 2021-12-24 NOTE — Progress Notes (Signed)
Patient suffers from dizziness which impairs their ability to perform daily activities like ambulate in the home/community.  A walker alone will not resolve the issues with performing activities of daily living. A wheelchair will allow patient to safely perform daily activities.  The patient can self propel in the home or has a caregiver who can provide assistance.      Zenaida Niece, PT, DPT Acute Rehabilitation Office [phone]

## 2021-12-24 NOTE — Progress Notes (Signed)
Patient ID: Craig Schmidt, male   DOB: 1970-12-05, 51 y.o.   MRN: [id number]     Advanced Heart Failure Rounding Note  PCP-Cardiologist: Janina Mayo, MD   Subjective:    He continues to have chronic, relatively mild right-sided chest pain.  Creatinine down 1.81 => 1.67.  CVP 5, co-ox 71%. SBP > 100.   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    Objective:   Weight Range: 73.8 kg Body mass index is 26.26 kg/m.   Vital Signs:   Temp:  [97.8 F (36.6 C)-98.5 F (36.9 C)] 98.3 F (36.8 C) (06/04 0405) Pulse Rate:  [44-95] 93 (06/04 0405) Resp:  [20] 20 (06/04 0000) BP: (103-140)/(75-83) 109/75 (06/04 0405) SpO2:  [96 %-100 %] 100 % (06/04 0405) Weight:  [73.8 kg] 73.8 kg (06/04 0500) Last BM Date : 12/22/21  Weight change: Filed Weights   12/22/21 0401 12/23/21 0534 12/24/21 0500  Weight: 72 kg 72.2 kg 73.8 kg    Intake/Output:   Intake/Output Summary (Last 24 hours) at 12/24/2021 0852 Last data filed at 12/24/2021 0500 Gross per 24 hour  Intake 477 ml  Output 550 ml  Net -73 ml      Physical Exam    CVP 5  General: NAD Neck: No JVD, no thyromegaly or thyroid nodule.  Lungs: Clear to auscultation bilaterally with normal respiratory effort. CV: Nondisplaced PMI.  Heart regular S1/S2, no S3/S4, 1/6 HSM apex.  No peripheral edema.   Abdomen: Soft, nontender, no hepatosplenomegaly, no distention.  Skin: Intact without lesions or rashes.  Neurologic: Alert and oriented x 3.  Psych: Normal affect. Extremities: No clubbing or cyanosis.  HEENT: Normal.   Telemetry   NSR 80s   EKG    N/A   Labs    CBC Recent Labs    12/22/21 0500  WBC 10.9*  HGB 15.0  HCT 45.5  MCV 89.7  PLT 053   Basic Metabolic Panel Recent Labs    12/23/21 0358 12/24/21 0415  NA 134* 133*  K 4.5 4.9  CL 96* 100  CO2 28 26  GLUCOSE 129* 128*  BUN 33* 35*  CREATININE 1.81* 1.67*  CALCIUM 9.7 9.4   Liver Function Tests Recent  Labs    12/21/21 1752  AST 62*  ALT 14  ALKPHOS 85  BILITOT 0.6  PROT 6.8  ALBUMIN 3.0*   No results for input(s): LIPASE, AMYLASE in the last 72 hours. Cardiac Enzymes No results for input(s): CKTOTAL, CKMB, CKMBINDEX, TROPONINI in the last 72 hours.  BNP: BNP (last 3 results) Recent Labs    09/10/21 0400 12/19/21 1929  BNP 1,570.7* 809.0*    ProBNP (last 3 results) No results for input(s): PROBNP in the last 8760 hours.   D-Dimer No results for input(s): DDIMER in the last 72 hours. Hemoglobin A1C No results for input(s): HGBA1C in the last 72 hours.  Fasting Lipid Panel No results for input(s): CHOL, HDL, LDLCALC, TRIG, CHOLHDL, LDLDIRECT in the last 72 hours.  Thyroid Function Tests No results for input(s): TSH, T4TOTAL, T3FREE, THYROIDAB in the last 72 hours.  Invalid input(s): FREET3  Other results:   Imaging    No results found.   Medications:     Scheduled Medications:  apixaban  5 mg Oral BID   atorvastatin  80 mg Oral Daily   Chlorhexidine Gluconate Cloth  6 each Topical Daily   clopidogrel  75 mg Oral Daily   dapagliflozin propanediol  10 mg Oral Daily   digoxin  0.125 mg Oral Daily   isosorbide-hydrALAZINE  0.5 tablet Oral TID   nicotine  7 mg Transdermal Daily   sodium chloride flush  10-40 mL Intracatheter Q12H   sodium chloride flush  3 mL Intravenous Q12H    Infusions:  sodium chloride 10 mL/hr at 12/19/21 1744   sodium chloride     sodium chloride      PRN Medications: sodium chloride, acetaminophen, ondansetron (ZOFRAN) IV, polyvinyl alcohol, sodium chloride flush, sodium chloride flush    Patient Profile   51 y.o. male with hx polysubstance abuse, homelessness, CVA 02/23, HTN, recent diagnosis HFrEF and mitral stenosis.    Admitted with acute anterolateral STEMI and a/c CHF  Assessment/Plan   1. Acute MI: Admission with inferolateral STEMI by ECG, cath showed occluded apical LAD.  Cannot rule out plaque rupture in  setting of cocaine abuse, but given history of suspected cardioembolic CVA, I wonder if this was not a cardioembolic MI.  No further chest pain.  - Continue atorvastatin  - Continue Plavix (post-MI) + Eliquis.  Think he should remain anticoagulated (was supposed to be on prior to admission but not sure if he was taking) given suspected cardioembolism.  2. Acute/chronic systolic CHF: Patient was found to have cardiomyopathy in 2/23 at time of admission for CVA.  Echo showed EF 25-30% at that time.  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  Echo this admission showed LV EF < 20%, RV mildly decreased function, abnormal mitral valve looks rheumatic => suspect moderate mitral stenosis at least with planimetered MVA 1.6 cm^2 and calculated MVA by VTI 0.8 cm^2; mean gradient only 6 mmHg but likely function of low cardiac output. The IVC was small/non-dilated. On conversation with him, I suspect he was not taking his medications correctly at home.  He developed AKI after starting GDMT here and getting diuresed.  Creatinine up to 2.0 and hypotensive (now improved).  Echo showed a small/nondilated IVC and REDS clip was normal at 31%.  I do not think he is volume overloaded.  Suspect AKI/drop in BP is most likely due to overdiuresis and getting "home meds" that he was probably actually not taking. Not in low output state, co-ox 71% today. Not volume overloaded, CVP 5. SBP 110s.  Creatinine lower at 1.81 => 1.65.   - Continue digoxin 0.125.  - Continue Bidil 0.5 tid. - Add Farxiga 10 mg daily.   - Not a candidate for advanced therapies or home inotropes with active substance abuse (used cocaine just prior to admission).  3. CVA: 2/23, has residual right-sided weakness.  Thought to be cardioembolic at the time, started on anticoagulation.   - Use Eliquis given concerns with compliance with warfarin INR checks.  4. Substance abuse: Urged him to quit cocaine.   5. 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  I reviewed this admission's echo, it shows moderate mitral stenosis at least with planimetered MVA 1.6 cm^2 and calculated MVA by VTI 0.8 cm^2; mean gradient only 6 mmHg but possibly function of low cardiac output.  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  Unusual given age.  Hemodynamic LHC/RHC eventually would be helpful for full evaluation of mitral stenosis but he is refusing today and not currently a good surgical candidate with severe biventricular failure (though will need to review prior TEE for valvuloplasty candidacy).  - Not currently candidate  for valve replacement with substance abuse and markedly low LV function.   6. Homeless: I really think he needs some sort of placement. I wonder if we can get him into a group home.  Will ask social work to assist with this.  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  - Continue to work with PT, use walker.   Will try to get him into a group home, needs to be seen by Education officer, museum.    Length of Stay: [place], MD  12/24/2021, 8:52 AM  Advanced Heart Failure Team Pager [phone] (M-F; 7a - 5p)  Please contact [facility] for night-coverage after hours (5p -7a ) and weekends on [email or web address]

## 2021-12-24 NOTE — Progress Notes (Signed)
Physical Therapy Treatment Patient Details Name: Craig Schmidt MRN: [id number] DOB: July 15, 1971 Today's Date: 12/24/2021   History of Present Illness 51 y.o. male presents to [facility] hospital on 12/19/2021 with chest pain. EKG with ST elevation in ED. Pt underwent cardiac cath on 5/30, demonstrating occlusion of apical LAD, severe systolic dysfunction with EF less than 25%. Pt refusing right heart cath 6/2. 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    PT Comments    Pt limited by dizzy spells this session when attempting to ambulate. Pt describes dizziness as the room spinning around him, like getting off a merry-go-round. Dizzy spells last <1 minute. PT introduces VOR x1 exercise to aide in improving vestibular function. Pt expresses a significant fear of falling due to dizzy spells, needing PT to urgently grab a chair to sit in multiple times during session. Pt must be able to mobilize in the community due to lack of a home/shelter at this time. A manual wheelchair would reduce his falls risk and allow for improved safety when mobilizing in the community.  Recommendations for follow up therapy are one component of a multi-disciplinary discharge planning process, led by the attending physician.  Recommendations may be updated based on patient status, additional functional criteria and insurance authorization.  Follow Up Recommendations  Outpatient PT (outpatient vestibular rehab)     Assistance Recommended at Discharge PRN  Patient can return home with the following A little help with walking and/or transfers;A little help with bathing/dressing/bathroom;Assistance with cooking/housework;Assist for transportation;Help with stairs or ramp for entrance   Equipment Recommendations  Wheelchair (measurements PT);Wheelchair cushion (measurements PT)    Recommendations for Other Services       Precautions /  Restrictions Precautions Precautions: Fall Precaution Comments: cerebellar infarcts and vestibular schwannoma Restrictions Weight Bearing Restrictions: No     Mobility  Bed Mobility Overal bed mobility: Modified Independent Bed Mobility: Supine to Sit, Sit to Supine     Supine to sit: Modified independent (Device/Increase time) Sit to supine: Modified independent (Device/Increase time)   General bed mobility comments: increased time, HOB elevated    Transfers Overall transfer level: Needs assistance Equipment used: Hemi-walker Transfers: Sit to/from Stand Sit to Stand: Min guard                Ambulation/Gait Ambulation/Gait assistance: Min guard Gait Distance (Feet): 50 Feet (additional trials of 20', 30') Assistive device: Hemi-walker Gait Pattern/deviations: Step-to pattern Gait velocity: reduced Gait velocity interpretation: <1.31 ft/sec, indicative of household ambulator   General Gait Details: pt with slowed step-to gait, 3 seated breaks due to reports of dizzy spells   Stairs             Wheelchair Mobility    Modified Rankin (Stroke Patients Only)       Balance Overall balance assessment: Needs assistance Sitting-balance support: No upper extremity supported, Feet supported Sitting balance-Leahy Scale: Good     Standing balance support: No upper extremity supported Standing balance-Leahy Scale: Fair                              Cognition Arousal/Alertness: Awake/alert Behavior During Therapy: WFL for tasks assessed/performed Overall Cognitive Status: Within Functional Limits for tasks assessed  Exercises Other Exercises Other Exercises: VOR x1 horizontal 2 reps 30 seconds    General Comments General comments (skin integrity, edema, etc.): VSS on RA, pt with 3 instances of dizzy spells during session. Pt reports these spells typically occur when he is standing  and mobilizing, however they do rarely occur when seated. Pt reports it feels as if the room is spinning. Spells during this session last 1 minute or less.      Pertinent Vitals/Pain Pain Assessment Pain Assessment: No/denies pain    Home Living                          Prior Function            PT Goals (current goals can now be found in the care plan section) Acute Rehab PT Goals Patient Stated Goal: to reduce falls risk Progress towards PT goals: Progressing toward goals    Frequency    Min 5X/week      PT Plan Current plan remains appropriate    Co-evaluation              AM-PAC PT "6 Clicks" Mobility   Outcome Measure  Help needed turning from your back to your side while in a flat bed without using bedrails?: A Little Help needed moving from lying on your back to sitting on the side of a flat bed without using bedrails?: A Little Help needed moving to and from a bed to a chair (including a wheelchair)?: A Little Help needed standing up from a chair using your arms (e.g., wheelchair or bedside chair)?: A Little Help needed to walk in hospital room?: A Little Help needed climbing 3-5 steps with a railing? : Total 6 Click Score: 16    End of Session   Activity Tolerance: Treatment limited secondary to medical complications (Comment) (dizziness) Patient left: in bed;with call bell/phone within reach;with bed alarm set Nurse Communication: Mobility status PT Visit Diagnosis: History of falling (Z91.81);Other symptoms and signs involving the nervous system (R29.898)     Time: 0017-4944 PT Time Calculation (min) (ACUTE ONLY): 57 min  Charges:  $Gait Training: 23-37 mins $Therapeutic Exercise: 8-22 mins $Therapeutic Activity: 8-22 mins                     Zenaida Niece, PT, DPT Acute Rehabilitation Office [place] 12/24/2021, 6:01 PM

## 2021-12-25 ENCOUNTER — Other Ambulatory Visit (HOSPITAL_COMMUNITY): Payer: Self-pay

## 2021-12-25 LAB — COOXEMETRY PANEL
Carboxyhemoglobin: 1.8 % — ABNORMAL HIGH (ref 0.5–1.5)
Methemoglobin: <0.7 % (ref 0.0–1.5)
O2 Saturation: 70.1 %
Total hemoglobin: 13.8 g/dL (ref 12.0–16.0)

## 2021-12-25 LAB — BASIC METABOLIC PANEL
Anion gap: 8 (ref 5–15)
BUN: 34 mg/dL — ABNORMAL HIGH (ref 6–20)
CO2: 24 mmol/L (ref 22–32)
Calcium: 9.6 mg/dL (ref 8.9–10.3)
Chloride: 102 mmol/L (ref 98–111)
Creatinine, Ser: 1.64 mg/dL — ABNORMAL HIGH (ref 0.61–1.24)
GFR, Estimated: 51 mL/min — ABNORMAL LOW (ref >60)
Glucose, Bld: 116 mg/dL — ABNORMAL HIGH (ref 70–99)
Potassium: 4.5 mmol/L (ref 3.5–5.1)
Sodium: 134 mmol/L — ABNORMAL LOW (ref 135–145)

## 2021-12-25 LAB — MAGNESIUM: Magnesium: 2.2 mg/dL (ref 1.7–2.4)

## 2021-12-25 MED ORDER — EMPAGLIFLOZIN 10 MG PO TABS
10.0000 mg | ORAL_TABLET | Freq: Every day | ORAL | Status: DC
  Administered 2021-12-26 – 2021-12-27 (×2): 10 mg via ORAL
  Filled 2021-12-25 (×2): qty 1

## 2021-12-25 MED ORDER — FAMOTIDINE 20 MG PO TABS
20.0000 mg | ORAL_TABLET | Freq: Every day | ORAL | Status: DC
  Administered 2021-12-25 – 2021-12-27 (×3): 20 mg via ORAL
  Filled 2021-12-25 (×3): qty 1

## 2021-12-25 MED ORDER — ISOSORB DINITRATE-HYDRALAZINE 20-37.5 MG PO TABS
1.0000 | ORAL_TABLET | Freq: Three times a day (TID) | ORAL | Status: DC
  Administered 2021-12-25 – 2021-12-27 (×8): 1 via ORAL
  Filled 2021-12-25 (×7): qty 1

## 2021-12-25 MED ORDER — IVABRADINE HCL 5 MG PO TABS
5.0000 mg | ORAL_TABLET | Freq: Two times a day (BID) | ORAL | Status: DC
  Administered 2021-12-25 – 2021-12-27 (×6): 5 mg via ORAL
  Filled 2021-12-25 (×7): qty 1

## 2021-12-25 NOTE — Progress Notes (Addendum)
Patient ID: Craig Schmidt, male   DOB: 1971-01-03, 51 y.o.   MRN: [id number]     Advanced Heart Failure Rounding Note  PCP-Cardiologist: Janina Mayo, MD   Subjective:    Co-ox 70%. CVP 4   Creatinine down 1.81 => 1.67 =>1.64.   C/w rt sided CP. No dyspnea.   NSR w/ ventricular trigeminy on tele.   K 4.5 Mg pending    Objective:   Weight Range: 74 kg Body mass index is 26.33 kg/m.   Vital Signs:   Temp:  [98.3 F (36.8 C)-98.6 F (37 C)] 98.6 F (37 C) (06/05 0510) Pulse Rate:  [48-106] 106 (06/05 0510) Resp:  [16-19] 16 (06/04 2117) BP: (118-122)/(71-96) 122/94 (06/05 0510) SpO2:  [95 %-99 %] 98 % (06/05 0510) Weight:  [74 kg] 74 kg (06/05 0500) Last BM Date : 12/24/21  Weight change: Filed Weights   12/23/21 0534 12/24/21 0500 12/25/21 0500  Weight: 72.2 kg 73.8 kg 74 kg    Intake/Output:   Intake/Output Summary (Last 24 hours) at 12/25/2021 0840 Last data filed at 12/25/2021 0300 Gross per 24 hour  Intake --  Output 1250 ml  Net -1250 ml      Physical Exam    CVP 4  General:  Disheveled appearing, thin AAM. No respiratory difficulty HEENT: normal Neck: supple. no JVD. Carotids 2+ bilat; no bruits. No lymphadenopathy or thyromegaly appreciated. Cor: PMI nondisplaced. Regular rate & rhythm. No rubs, gallops or murmurs. Lungs: clear Abdomen: soft, nontender, nondistended. No hepatosplenomegaly. No bruits or masses. Good bowel sounds. Extremities: no cyanosis, clubbing, rash, edema + LUE PICC  Neuro: alert & oriented x 3, cranial nerves grossly intact. moves all 4 extremities w/o difficulty. Affect pleasant.   Telemetry   NSR 90s w/ ventricular trigeminy   EKG    N/A   Labs    CBC No results for input(s): WBC, NEUTROABS, HGB, HCT, MCV, PLT in the last 72 hours.  Basic Metabolic Panel Recent Labs    12/24/21 0415 12/25/21 0500  NA 133* 134*  K 4.9 4.5  CL 100 102  CO2 26 24  GLUCOSE 128* 116*  BUN 35* 34*  CREATININE 1.67*  1.64*  CALCIUM 9.4 9.6   Liver Function Tests No results for input(s): AST, ALT, ALKPHOS, BILITOT, PROT, ALBUMIN in the last 72 hours.  No results for input(s): LIPASE, AMYLASE in the last 72 hours. Cardiac Enzymes No results for input(s): CKTOTAL, CKMB, CKMBINDEX, TROPONINI in the last 72 hours.  BNP: BNP (last 3 results) Recent Labs    09/10/21 0400 12/19/21 1929  BNP 1,570.7* 809.0*    ProBNP (last 3 results) No results for input(s): PROBNP in the last 8760 hours.   D-Dimer No results for input(s): DDIMER in the last 72 hours. Hemoglobin A1C No results for input(s): HGBA1C in the last 72 hours.  Fasting Lipid Panel No results for input(s): CHOL, HDL, LDLCALC, TRIG, CHOLHDL, LDLDIRECT in the last 72 hours.  Thyroid Function Tests No results for input(s): TSH, T4TOTAL, T3FREE, THYROIDAB in the last 72 hours.  Invalid input(s): FREET3  Other results:   Imaging    No results found.   Medications:     Scheduled Medications:  apixaban  5 mg Oral BID   atorvastatin  80 mg Oral Daily   Chlorhexidine Gluconate Cloth  6 each Topical Daily   clopidogrel  75 mg Oral Daily   dapagliflozin propanediol  10 mg Oral Daily   digoxin  0.125 mg Oral  Daily   isosorbide-hydrALAZINE  0.5 tablet Oral TID   nicotine  7 mg Transdermal Daily   sodium chloride flush  10-40 mL Intracatheter Q12H   sodium chloride flush  3 mL Intravenous Q12H    Infusions:  sodium chloride 10 mL/hr at 12/19/21 1744   sodium chloride     sodium chloride      PRN Medications: sodium chloride, acetaminophen, ondansetron (ZOFRAN) IV, polyvinyl alcohol, sodium chloride flush, sodium chloride flush    Patient Profile   51 y.o. male with hx polysubstance abuse, homelessness, CVA 02/23, HTN, recent diagnosis HFrEF and mitral stenosis.    Admitted with acute anterolateral STEMI and a/c CHF  Assessment/Plan   1. Acute MI: Admission with inferolateral STEMI by ECG, cath showed occluded  apical LAD.  Cannot rule out plaque rupture in setting of cocaine abuse, but given history of suspected cardioembolic CVA, I wonder if this was not a cardioembolic MI.  No further ischemic chest pain.  - Continue atorvastatin  - Continue Plavix (post-MI) + Eliquis.  Think he should remain anticoagulated (was supposed to be on prior to admission but not sure if he was taking) given suspected cardioembolism.  2. Acute/chronic systolic CHF: Patient was found to have cardiomyopathy in 2/23 at time of admission for CVA.  Echo showed EF 25-30% at that time.  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  Echo this admission showed LV EF < 20%, RV mildly decreased function, abnormal mitral valve looks rheumatic => suspect moderate mitral stenosis at least with planimetered MVA 1.6 cm^2 and calculated MVA by VTI 0.8 cm^2; mean gradient only 6 mmHg but likely function of low cardiac output. The IVC was small/non-dilated. On conversation with him, I suspect he was not taking his medications correctly at home.  He developed AKI after starting GDMT here and getting diuresed.  Creatinine up to 2.0 and hypotensive (now improved).  Echo showed a small/nondilated IVC and REDS clip was normal at 31%.  I do not think he is volume overloaded.  Suspect AKI/drop in BP is most likely due to overdiuresis and getting "home meds" that he was probably actually not taking. Not in low output state, co-ox 70% today. Not volume overloaded, CVP 4. SBP 120s.  Creatinine lower at 1.81 => 1.67=>1.64   - Continue digoxin 0.125.  - Continue Bidil 0.5 tid. - Continue Farxiga 10 mg daily.   - Not a candidate for advanced therapies or home inotropes with active substance abuse (used cocaine just prior to admission).  3. CVA: 2/23, has residual right-sided weakness.  Thought to be cardioembolic at the time, started on anticoagulation.   - Use Eliquis given concerns with compliance with  warfarin INR checks.  4. Substance abuse: Urged him to quit cocaine.  5. 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  I reviewed this admission's echo, it shows moderate mitral stenosis at least with planimetered MVA 1.6 cm^2 and calculated MVA by VTI 0.8 cm^2; mean gradient only 6 mmHg but possibly function of low cardiac output.  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  Unusual given age. Hemodynamic LHC/RHC eventually would be helpful for full evaluation of mitral stenosis but he is refusing today and not currently a good surgical candidate with severe biventricular failure (though will need to review prior TEE for valvuloplasty candidacy).  - Not currently candidate for valve replacement with substance abuse and markedly low LV function.  6. Homeless: I really think he needs some sort of placement. I wonder if we can get him into a group home.  Will ask social work to assist with this.  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  - Continue to work with PT, use walker.  8. Ventricular Trigeminy - K 4.5, Check Mg supp if needed   Will try to get him into a group home, needs to be seen by Education officer, museum. Anticipate d/c soon once placement arrangements sorted out.   Length of Stay: [address] [name]  12/25/2021, 8:40 AM  Advanced Heart Failure Team Pager [phone] (M-F; 7a - 5p)  Please contact [facility] for night-coverage after hours (5p -7a ) and weekends on [email or web address]  Patient seen with PA, agree with the above note.   Still with pleuritic right-sided chest pain.  Continues to have vertigo, PT thinks he is safest in wheelchair. CVP 4, co-ox 71%, creatinine stable 1.64.   General: NAD Neck: No JVD, no thyromegaly or thyroid nodule.  Lungs: Clear to auscultation bilaterally with normal respiratory effort. CV: Nondisplaced PMI.  Heart regular S1/S2, no S3/S4, no murmur.  No peripheral edema.  Abdomen: Soft, nontender,  no hepatosplenomegaly, no distention.  Skin: Intact without lesions or rashes.  Neurologic: Alert and oriented x 3.  Psych: Normal affect. Extremities: No clubbing or cyanosis.  HEENT: Normal.   Not volume overloaded.  Will increase Bidil today to 1 tab tid and add Corlanor 5 mg bid.   Per PT, needs wheelchair given vertigo (from vestibular schwannoma).  Ideally, needs group home or substance abuse rehab.  Need lots of thought here from social work.   Craig Schmidt 12/25/2021 9:05 AM

## 2021-12-25 NOTE — TOC Initial Note (Addendum)
Transition of Care [facility]) - Initial/Assessment Note    Patient Details  Name: Craig Schmidt MRN: [id number] Date of Birth: 02/06/1971  [facility]) CM/SW Contact:    Erenest Rasher, RN Phone Number: [phone] 12/25/2021, 1:01 PM  Clinical Narrative:                 HF TOC CM spoke to pt at bedside. States he wants to go to [facility]. States after his stroke he has been having difficulty with dizziness. Gave permission to contact sister, Alease Frame and Elmo Putt, [facility] rep. States his application for SS disability is pending. Gave permission to create FL2 and fax referral to SNF.  Pt will need wheelchair.   PT/OT sent a message to reevaluate.   HF TOC CM spoke to sister and states she will pick up pt's belongings and dog from the girlfriend. States she agrees with SNF placement.   Expected Discharge Plan: Skilled Nursing Facility Barriers to Discharge: Continued Medical Work up   Patient Goals and CMS Choice Patient states their goals for this hospitalization and ongoing recovery are:: agreeable to SNF or ALF CMS Medicare.gov Compare Post Acute Care list provided to:: Patient Choice offered to / list presented to : Patient  Expected Discharge Plan and Services Expected Discharge Plan: [place]   Discharge Planning Services: CM Consult Post Acute Care Choice: [place] Living arrangements for the past 2 months: Homeless                                      Prior Living Arrangements/Services Living arrangements for the past 2 months: Homeless Lives with:: Self Patient language and need for interpreter reviewed:: Yes        Need for Family Participation in Patient Care: Yes (Comment) Care giver support system in place?: Yes (comment)   Criminal Activity/Legal Involvement Pertinent to Current Situation/Hospitalization: No - Comment as needed  Activities of Daily Living      Permission  Sought/Granted Permission sought to share information with : Case Manager, Family Supports Permission granted to share information with : Yes, Verbal Permission Granted  Share Information with NAME: Val Riles  Permission granted to share info w AGENCY: SNF, ALF  Permission granted to share info w Relationship: sister, [facility]  Permission granted to share info w Contact Information: [phone], [phone] ext [phone]  Emotional Assessment Appearance:: Appears older than stated age Attitude/Demeanor/Rapport: Engaged Affect (typically observed): Accepting Orientation: : Oriented to Self, Oriented to Place, Oriented to  Time, Oriented to Situation   Psych Involvement: No (comment)  Admission diagnosis:  ST elevation myocardial infarction (STEMI), unspecified artery (HCC) [I21.3] ST elevation myocardial infarction (STEMI) of inferolateral wall, subsequent episode of care [facility]) [I21.19] Patient Active Problem List   Diagnosis Date Noted   Acute on chronic combined systolic and diastolic CHF (congestive heart failure) (HCC)    ST elevation myocardial infarction (STEMI) of inferolateral wall, subsequent episode of care ([place]) 12/19/2021   ST elevation myocardial infarction (STEMI) ([place])    Ischemic cardiomyopathy    Cocaine abuse ([place])    Smoker    Alcohol abuse    Acute ischemic stroke ([place]) 09/08/2021   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[place]) 05/20/2015   Tear of MCL (medial collateral ligament) of knee 05/20/2015   Protein-calorie malnutrition, severe 05/17/2015   Lactic acidosis 05/15/2015   Left  knee pain 05/15/2015   Lung nodules 05/15/2015   Hypoglycemia 05/14/2015   Hypothermia 05/14/2015   Acute encephalopathy 05/14/2015   Cocaine abuse with intoxication ([place]) 05/14/2015   Alcohol intoxication in active alcoholic ([place]) 98/26/4158   Sepsis ([place]) 05/14/2015   Homeless 05/14/2015   PCP:  Marliss Coots, NP Pharmacy:   [facility] [place], [address] [address] Phone: [phone] Fax: [phone]  [facility] [address] [place] [address] Phone: [phone] Fax: [phone]  [facility] [phone] [address] [place], [address] AT [place] [address] Phone: [phone] Fax: [phone]     Social Determinants of Health (SDOH) Interventions    Readmission Risk Interventions     View : No data to display.

## 2021-12-25 NOTE — Progress Notes (Signed)
Physical Therapy Treatment Patient Details Name: Craig Schmidt MRN: [id number] DOB: 1970-09-12 Today's Date: 12/25/2021   History of Present Illness 51 y.o. male presents to [facility] hospital on 12/19/2021 with chest pain. EKG with ST elevation in ED. Pt underwent cardiac cath on 5/30, demonstrating occlusion of apical LAD, severe systolic dysfunction with EF less than 25%. Pt refusing right heart cath 6/2. 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    PT Comments    Pt continues to be limited in safe mobility by decreased balance with dizziness caused by higher level balance activities like head turns and direction changes simulating mobility required for community ambulation. Pt able to ambulate at a min guard level with hemiwalker however requires standing rest break and up to minA for maintaining balance with onset of dizziness. PT recommending SNF level rehab at discharge to improve balance and stability with community ambulation. If SNF level discharge is not possible pt would benefit from wheelchair for community mobilization to have safe seated position when dizzy spells occur. PT will continue to follow acutely.      Recommendations for follow up therapy are one component of a multi-disciplinary discharge planning process, led by the attending physician.  Recommendations may be updated based on patient status, additional functional criteria and insurance authorization.  Follow Up Recommendations  Skilled nursing-short term rehab (<3 hours/day) (vestibular rehab)     Assistance Recommended at Discharge PRN  Patient can return home with the following A little help with walking and/or transfers;A little help with bathing/dressing/bathroom;Assistance with cooking/housework;Assist for transportation;Help with stairs or ramp for entrance   Equipment Recommendations  Wheelchair (measurements  PT);Wheelchair cushion (measurements PT)       Precautions / Restrictions Precautions Precautions: Fall Precaution Comments: cerebellar infarcts and vestibular schwannoma Restrictions Weight Bearing Restrictions: No     Mobility  Bed Mobility Overal bed mobility: Modified Independent Bed Mobility: Supine to Sit, Sit to Supine     Supine to sit: Modified independent (Device/Increase time) Sit to supine: Modified independent (Device/Increase time)   General bed mobility comments: increased time, HOB elevated    Transfers Overall transfer level: Needs assistance Equipment used: Hemi-walker Transfers: Sit to/from Stand Sit to Stand: Min guard                Ambulation/Gait Ambulation/Gait assistance: Min guard, Min assist Gait Distance (Feet): 50 Feet Assistive device: Hemi-walker Gait Pattern/deviations: Step-to pattern Gait velocity: reduced Gait velocity interpretation: <1.31 ft/sec, indicative of household ambulator   General Gait Details: pt with slowed step-to gait, pt requiring 2x rest breaks and minA for steadying secondary to dizziness and missteps with performing head turns up and down and R and L to simulate watching for traffic and stepping down of curbing       Balance Overall balance assessment: Needs assistance Sitting-balance support: No upper extremity supported, Feet supported Sitting balance-Leahy Scale: Good     Standing balance support: No upper extremity supported Standing balance-Leahy Scale: Fair Standing balance comment: requires UE support for dynamic activities             High level balance activites: Direction changes, Sudden stops, Head turns, Turns              Cognition Arousal/Alertness: Awake/alert Behavior During Therapy: WFL for tasks assessed/performed Overall Cognitive Status: Within Functional Limits for tasks assessed  General Comments General  comments (skin integrity, edema, etc.): VSS on RA, 2 dizzy spells with repeated head turns requiring minA for steadying      Pertinent Vitals/Pain Pain Assessment Pain Assessment: No/denies pain     PT Goals (current goals can now be found in the care plan section) Acute Rehab PT Goals Patient Stated Goal: to reduce falls risk PT Goal Formulation: With patient Time For Goal Achievement: 01/06/22 Potential to Achieve Goals: Fair Progress towards PT goals: Progressing toward goals    Frequency    Min 5X/week      PT Plan Discharge plan needs to be updated       AM-PAC PT "6 Clicks" Mobility   Outcome Measure  Help needed turning from your back to your side while in a flat bed without using bedrails?: A Little Help needed moving from lying on your back to sitting on the side of a flat bed without using bedrails?: A Little Help needed moving to and from a bed to a chair (including a wheelchair)?: A Little Help needed standing up from a chair using your arms (e.g., wheelchair or bedside chair)?: A Little Help needed to walk in hospital room?: A Lot Help needed climbing 3-5 steps with a railing? : Total 6 Click Score: 15    End of Session Equipment Utilized During Treatment: Gait belt Activity Tolerance: Treatment limited secondary to medical complications (Comment) (dizziness) Patient left: in bed;with call bell/phone within reach;with bed alarm set Nurse Communication: Mobility status PT Visit Diagnosis: History of falling (Z91.81);Other symptoms and signs involving the nervous system (B14.782)     Time: 9562-1308 PT Time Calculation (min) (ACUTE ONLY): 24 min  Charges:  $Gait Training: 8-22 mins $Therapeutic Activity: 8-22 mins                     Craig Schmidt B. Migdalia Dk PT, DPT Acute Rehabilitation Services Please use secure chat or  Call Office [phone]    Craig Schmidt 12/25/2021, 4:41 PM

## 2021-12-25 NOTE — Discharge Summary (Signed)
Advanced Heart Failure Team  Discharge Summary   Patient ID: Craig Schmidt MRN: [id number], DOB/AGE: 1971/01/14 51 y.o. Admit date: 12/19/2021 D/C date:     12/27/2021   Primary Discharge Diagnoses:  1. Acute MI 2. Acute/chronic systolic CHF 3. CVA  4. Substance Abuse 5. Mitral Stenosis 6. Vestibular Schwannoma 7. Ventricular Trigeminy 8. Homeless.   Hospital Course:  Craig Schmidt is a 51 y.o. 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   Admitted 02/23 with left MCA CVA s/p TNK. Stroke felt to be likely secondary to cardiomyopathy. Echo EF 25-30%, RV mildly reduced, RVSP 50 mmHg, moderate MS, mild to moderate Craig, moderate to severe TR, dilated IVC. TEE 02/23: EF 25-30%, RV severely reduced, MV stenotic (? Parachute mitral valve), MVA 1.45 cm2 consistent with severe MS, moderate Craig, no interatrial shunt, no LV or LAA thrombus. Cardiology consulted. CM likely d/t subtance abuse +/- uncontrolled HTN. Had been started on coumadin but later switched to eliquis d/t concern for compliance given his social situation.   Presented 12/19/21 with acute inferolateral STEMI. Had used cocaine 24 hrs prior to presentation. Last dose of Eliquis uncertain. Admitted under Cardiology and emergent LHC with 100% apical LAD, suspected embolic vs plaque rupture with thrombosis. LVEDP 40. Echo this admit: EF < 20%, ? RV okay. He was significantly volume overloaded and hypertensive. Initiated on lasix gtt and nitro gtt. Had good diuresis but Scr began to trend up, 1.5>1.77>1.87>2.0.  Developed soft BP (90s-low 100s). BP meds stopped. Diuretics discontinued. ReDS checked and ok at 31%. Lactic acid 1.8. There was concern for low-output. PICC was placed but Co-ox was within normal range in the 70s. CVP was low. Diuretics held. BP and AKI improved. GDMT added. CAD treated medically.  Eliquis restarted. CP resolved.   Also of note, pt noted to have at least moderate mitral valve stenosis on echo. Hemodynamic LHC/RHC eventually would be helpful for full evaluation of mitral stenosis, but pt refused RHC and not currently a good surgical candidate with severe biventricular failure and substance abuse history.   PT also evaluated pt given Vestibular schwannoma. Has vertigo and h/o falls. PT recommended wheel chair at discharge.   Social work also consulted to assist w/ placement in group home or substance abuse rehab, however pt has a dog whom he refused to give up, limiting placement options. No placement services able to accommodate pets. He was discharged to a motel with the assistance of Social Work Team.    1. Acute MI: Admission with inferolateral STEMI by ECG, cath showed occluded apical LAD.  Cannot rule out plaque rupture in setting of cocaine abuse, but given history of suspected cardioembolic CVA, I wonder if this was not a cardioembolic MI.  No chest pain.  - Continue atorvastatin  - Continue Plavix (post-MI) + Eliquis.  Think he should remain anticoagulated (was supposed to be on prior to admission but not sure if he was taking) given suspected cardioembolism.  2. Acute/chronic systolic CHF: Patient was found to have cardiomyopathy in 2/23 at time of admission for CVA.  Echo showed EF 25-30% at that time.  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  Echo this admission showed LV EF < 20%, RV mildly decreased function, abnormal mitral valve looks rheumatic => suspect moderate mitral stenosis at least with planimetered MVA 1.6 cm^2 and  calculated MVA by VTI 0.8 cm^2; mean gradient only 6 mmHg but likely function of low cardiac output. The IVC was small/non-dilated. On conversation with him, suspect he was not taking his medications correctly at home.  He developed AKI after starting GDMT here and getting diuresed.   Creatinine up to 2.0 and hypotensive (now improved).  Echo showed a small/nondilated IVC and REDS clip was normal at 31%.   - Volume status stable.  - Continue digoxin 0.125. Dig level 0.4  - Continue Bidil 1 tab tid. - Continue corlanor 5 mg twice a day.  - Continue Farxiga 10 mg daily.   - Not a candidate for advanced therapies or home inotropes with active substance abuse (used cocaine just prior to admission).  3. CVA: 2/23, has residual right-sided weakness.  Thought to be cardioembolic at the time, started on anticoagulation.   - Use Eliquis given concerns with compliance with warfarin INR checks.  4. Substance abuse: Urged him to quit cocaine.  5. 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  I reviewed this admission's echo, it shows moderate mitral stenosis at least with planimetered MVA 1.6 cm^2 and calculated MVA by VTI 0.8 cm^2; mean gradient only 6 mmHg but possibly function of low cardiac output.  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  Unusual given age. Hemodynamic LHC/RHC eventually would be helpful for full evaluation of mitral stenosis but he is refusing today and not currently a good surgical candidate with severe biventricular failure (though will need to review prior TEE for valvuloplasty candidacy).  - Not currently candidate for valve replacement with substance abuse and markedly low LV function.   6. Homeless: SW working on SNF.  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  - Continue to work with PT, use walker.  8. Ventricular Trigeminy   Discharge Weight : 156 pounds.   Discharge Vitals: Blood pressure 135/84, pulse 92, temperature 97.9 F (36.6 C), temperature source Oral, resp. rate 17, height '5\' 6"'$  (1.676 m), weight 71 kg, SpO2 97 %.  Labs: Lab Results  Component Value Date   WBC 10.9 (H) 12/22/2021   HGB 15.0 12/22/2021   HCT 45.5 12/22/2021   MCV 89.7 12/22/2021   PLT 374 12/22/2021    Recent Labs   Lab 12/21/21 1752 12/21/21 2144 12/27/21 0404  NA 116*   < > 134*  K 3.4*   < > 3.8  CL 79*   < > 100  CO2 25   < > 23  BUN 25*   < > 29*  CREATININE 1.92*   < > 1.75*  CALCIUM 8.7*   < > 9.4  PROT 6.8  --   --   BILITOT 0.6  --   --   ALKPHOS 85  --   --   ALT 14  --   --   AST 62*  --   --   GLUCOSE 124*   < > 169*   < > = values in this interval not displayed.   Lab Results  Component Value Date   CHOL 166 12/19/2021   HDL 61 12/19/2021   LDLCALC 95 12/19/2021   TRIG 48 12/19/2021   BNP (last 3 results) Recent Labs    09/10/21 0400 12/19/21 1929  BNP 1,570.7* 809.0*    ProBNP (last 3 results) No results for input(s): PROBNP in the last 8760 hours.   Diagnostic Studies/Procedures   No results found.  Discharge Medications  Allergies as of 12/27/2021       Reactions   Peanut-containing Drug Products Itching   Shellfish Allergy Swelling        Medication List     STOP taking these medications    acetic acid 2 % otic solution   calamine lotion   carvedilol 3.125 MG tablet Commonly known as: COREG   [place] Sea Nasal Spray 0.65 % nasal spray Generic drug: sodium chloride   nicotine 7 mg/24hr patch Commonly known as: NICODERM CQ - dosed in mg/24 hr   sacubitril-valsartan 24-26 MG Commonly known as: ENTRESTO       TAKE these medications    apixaban 5 MG Tabs tablet Commonly known as: ELIQUIS Take 1 tablet (5 mg total) by mouth 2 (two) times daily.   atorvastatin 40 MG tablet Commonly known as: LIPITOR Take 1 tablet (40 mg total) by mouth daily.   clopidogrel 75 MG tablet Commonly known as: PLAVIX Take 1 tablet (75 mg total) by mouth daily. Start taking on: December 28, 2021   digoxin 0.125 MG tablet Commonly known as: LANOXIN Take 1 tablet (0.125 mg total) by mouth daily. Start taking on: December 28, 2021   empagliflozin 10 MG Tabs tablet Commonly known as: JARDIANCE Take 1 tablet (10 mg total) by mouth daily. Start taking on: December 28, 2021   famotidine 20 MG tablet Commonly known as: PEPCID Take 1 tablet (20 mg total) by mouth daily. Start taking on: December 28, 2021   isosorbide-hydrALAZINE 20-37.5 MG tablet Commonly known as: BIDIL Take 1 tablet by mouth 3 (three) times daily.   ivabradine 5 MG Tabs tablet Commonly known as: CORLANOR Take 1 tablet (5 mg total) by mouth 2 (two) times daily with a meal.   polyvinyl alcohol 1.4 % ophthalmic solution Commonly known as: LIQUIFILM TEARS Place 1 drop into both eyes as needed for dry eyes. What changed: how much to take        Disposition   The patient will be discharged in stable condition to a motel.  Discharge Instructions     (HEART FAILURE PATIENTS) Call MD:  Anytime you have any of the following symptoms: 1) 3 pound weight gain in 24 hours or 5 pounds in 1 week 2) shortness of breath, with or without a dry hacking cough 3) swelling in the hands, feet or stomach 4) if you have to sleep on extra pillows at night in order to breathe.   Complete by: As directed    Amb Referral to Cardiac Rehabilitation   Complete by: As directed    Diagnosis: STEMI   After initial evaluation and assessments completed: Virtual Based Care may be provided alone or in conjunction with Phase 2 Cardiac Rehab based on patient barriers.: Yes   Diet - low sodium heart healthy   Complete by: As directed    Heart Failure patients record your daily weight using the same scale at the same time of day   Complete by: As directed    Increase activity slowly   Complete by: As directed        Follow-up Information     [facility] Follow up on 01/10/2022.   Specialty: Cardiology Why: Advanced [facility] Failure [facility] 10:30 am Entrance C, Free Valet Parking Contact information: [address] [phone] [place] [phone]                  Duration of Discharge Encounter: Greater  than 35 minutes    Signed, [name], NP-C   12/27/2021, 1:00 PM

## 2021-12-25 NOTE — Progress Notes (Signed)
Pt has stated he has been getting help at  [facility] located at [facility] [place]. His contact in there is Ms Elmo Putt . Her extension number is [phone]. Tel number [phone]. He is also asking if he can  qualify to be in an assisted living . He also said he has a disability claim pending  with [employer] [place]  started when he had a stroke last time .He was here in February 2023.

## 2021-12-25 NOTE — NC FL2 (Signed)
[place] MEDICAID FL2 LEVEL OF CARE SCREENING TOOL     IDENTIFICATION  Patient Name: Craig Schmidt Birthdate: 10/02/1970 Sex: male Admission Date (Current Location): 12/19/2021  [facility] and [place] Number:  Herbalist and Address:  [facility], [place] [address], [place], [place] [address]      Provider Number: [id number]  Attending Physician Name and Address:  Belva Crome, MD  Relative Name and Phone Number:       Current Level of Care: Hospital Recommended Level of Care: [facility] Prior Approval Number:    Date Approved/Denied:   PASRR Number: [id number]  Discharge Plan: SNF    Current Diagnoses: Patient Active Problem List   Diagnosis Date Noted   Acute on chronic combined systolic and diastolic CHF (congestive heart failure) (HCC)    ST elevation myocardial infarction (STEMI) of inferolateral wall, subsequent episode of care ([place]) 12/19/2021   ST elevation myocardial infarction (STEMI) ([place])    Ischemic cardiomyopathy    Cocaine abuse ([place])    Smoker    Alcohol abuse    Acute ischemic stroke ([place]) 09/08/2021   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[place]) 05/20/2015   Tear of MCL (medial collateral ligament) of knee 05/20/2015   Protein-calorie malnutrition, severe 05/17/2015   Lactic acidosis 05/15/2015   Left knee pain 05/15/2015   Lung nodules 05/15/2015   Hypoglycemia 05/14/2015   Hypothermia 05/14/2015   Acute encephalopathy 05/14/2015   Cocaine abuse with intoxication ([place]) 05/14/2015   Alcohol intoxication in active alcoholic ([place]) 40/81/4481   Sepsis ([place]) 05/14/2015   Homeless 05/14/2015    Orientation RESPIRATION BLADDER Height & Weight     Self, Time, Situation, Place  Normal Continent Weight: 163 lb 2.3 oz (74 kg) Height:  '5\' 6"'$  (167.6 cm)  BEHAVIORAL SYMPTOMS/MOOD NEUROLOGICAL BOWEL NUTRITION STATUS      Continent Diet (Please see DC Summary)  AMBULATORY STATUS COMMUNICATION OF NEEDS Skin    Limited Assist Verbally Normal                       Personal Care Assistance Level of Assistance  Bathing, Feeding, Dressing Bathing Assistance: Limited assistance Feeding assistance: Independent Dressing Assistance: Limited assistance     Functional Limitations Info  Sight, Hearing, Speech Sight Info: Adequate Hearing Info: Adequate Speech Info: Adequate    SPECIAL CARE FACTORS FREQUENCY  PT (By licensed PT), OT (By licensed OT)     PT Frequency: 3x/week OT Frequency: 3x/week            Contractures Contractures Info: Not present    Additional Factors Info  Code Status, Allergies Code Status Info: FULL Allergies Info: Peanut-containing Drug Products, Shellfish Allergy           Current Medications (12/25/2021):  This is the current hospital active medication list Current Facility-Administered Medications  Medication Dose Route Frequency Provider Last Rate Last Admin   0.9 %  sodium chloride infusion   Intravenous Continuous Hayden Rasmussen, MD 10 mL/hr at 12/19/21 1744 New Bag at 12/19/21 1744   0.9 %  sodium chloride infusion  250 mL Intravenous PRN Belva Crome, MD       0.9 %  sodium chloride infusion   Intravenous Continuous Belva Crome, MD       acetaminophen (TYLENOL) tablet 650 mg  650 mg Oral Q4H PRN Belva Crome, MD       apixaban Arne Cleveland) tablet 5 mg  5 mg Oral  BID Joette Catching, [place]   5 mg at 12/25/21 3546   atorvastatin (LIPITOR) tablet 80 mg  80 mg Oral Daily Reino Bellis B, NP   80 mg at 12/25/21 5681   Chlorhexidine Gluconate Cloth 2 % PADS 6 each  6 each Topical Daily Belva Crome, MD   6 each at 12/25/21 1306   clopidogrel (PLAVIX) tablet 75 mg  75 mg Oral Daily Early Osmond, MD   75 mg at 12/25/21 0857   digoxin (LANOXIN) tablet 0.125 mg  0.125 mg Oral Daily [name], MD   0.125 mg at 12/25/21 0856   [START ON 12/26/2021] empagliflozin (JARDIANCE) tablet 10 mg  10 mg Oral Daily [name], MD        famotidine (PEPCID) tablet 20 mg  20 mg Oral Daily Belva Crome, MD   20 mg at 12/25/21 0856   isosorbide-hydrALAZINE (BIDIL) 20-37.5 MG per tablet 1 tablet  1 tablet Oral TID [name], MD   1 tablet at 12/25/21 0857   ivabradine (CORLANOR) tablet 5 mg  5 mg Oral BID WC [name], MD   5 mg at 12/25/21 1311   nicotine (NICODERM CQ - dosed in mg/24 hr) patch 7 mg  7 mg Transdermal Daily [name], MD   7 mg at 12/25/21 0857   ondansetron (ZOFRAN) injection 4 mg  4 mg Intravenous Q6H PRN Belva Crome, MD       polyvinyl alcohol (LIQUIFILM TEARS) 1.4 % ophthalmic solution 2 drop  2 drop Both Eyes PRN Nipp, Carriel T, MD       sodium chloride flush (NS) 0.9 % injection 10-40 mL  10-40 mL Intracatheter Q12H Belva Crome, MD   20 mL at 12/25/21 1312   sodium chloride flush (NS) 0.9 % injection 10-40 mL  10-40 mL Intracatheter PRN Belva Crome, MD       sodium chloride flush (NS) 0.9 % injection 3 mL  3 mL Intravenous Q12H Belva Crome, MD   3 mL at 12/25/21 1312   sodium chloride flush (NS) 0.9 % injection 3 mL  3 mL Intravenous PRN Belva Crome, MD         Discharge Medications: Please see discharge summary for a list of discharge medications.  Relevant Imaging Results:  Relevant Lab Results:   Additional Information SSN#: [id number]  Smiley Birr, LCSW

## 2021-12-26 LAB — BASIC METABOLIC PANEL
Anion gap: 12 (ref 5–15)
BUN: 34 mg/dL — ABNORMAL HIGH (ref 6–20)
CO2: 20 mmol/L — ABNORMAL LOW (ref 22–32)
Calcium: 9.6 mg/dL (ref 8.9–10.3)
Chloride: 103 mmol/L (ref 98–111)
Creatinine, Ser: 1.84 mg/dL — ABNORMAL HIGH (ref 0.61–1.24)
GFR, Estimated: 44 mL/min — ABNORMAL LOW (ref >60)
Glucose, Bld: 125 mg/dL — ABNORMAL HIGH (ref 70–99)
Potassium: 4.5 mmol/L (ref 3.5–5.1)
Sodium: 135 mmol/L (ref 135–145)

## 2021-12-26 LAB — COOXEMETRY PANEL
Carboxyhemoglobin: 1.5 % (ref 0.5–1.5)
Methemoglobin: <0.7 % (ref 0.0–1.5)
O2 Saturation: 78.3 %
Total hemoglobin: 14.2 g/dL (ref 12.0–16.0)

## 2021-12-26 MED ORDER — POLYVINYL ALCOHOL 1.4 % OP SOLN
1.0000 [drp] | OPHTHALMIC | Status: DC | PRN
  Filled 2021-12-26: qty 15

## 2021-12-26 NOTE — TOC Progression Note (Addendum)
Transition of Care [facility]) - Progression Note    Patient Details  Name: Craig Schmidt MRN: [id number] Date of Birth: [birth year]  [facility] CM/SW Contact  Hassell Patras, LCSW Phone Number: 12/26/2021, 3:43 PM  Clinical Narrative:    HF CSW spoke with [name] at bedside about a cell phone as he doesn't currently have a phone any longer. The [phone] is a government assurance wireless phone that he lost. CSW to speak with the outpatient HF team about a cell phone. CSW to provide [name] with a phone before he discharges.  Barriers to placement include: 13 SNF declines and 0 pending bed offers due to payor source and healthy blue Medicaid not reimbursing those claims, age, unstable living situation and substance history. Halfway houses/sober living require that the individual be able to work or at least be getting a check or some form of income, lack of support.  CSW will continue to follow through discharge.   Expected Discharge Plan: [place] Barriers to Discharge: Continued Medical Work up  Expected Discharge Plan and Services Expected Discharge Plan: [place]   Discharge Planning Services: CM Consult Post Acute Care Choice: [place] Living arrangements for the past 2 months: Homeless                                       Social Determinants of Health (SDOH) Interventions    Readmission Risk Interventions     View : No data to display.

## 2021-12-26 NOTE — Progress Notes (Signed)
Physical Therapy Treatment Patient Details Name: Craig Schmidt MRN: [id number] DOB: 1970/09/18 Today's Date: 12/26/2021   History of Present Illness 51 y.o. male admitted 12/19/2021 with chest pain and STEMI. Cardiac cath 5/30, demonstrating occlusion of apical LAD, severe systolic dysfunction with EF less than 25%. Pt refusing right heart cath 6/2. 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    PT Comments    Pt reports schwannoma for multiple years but only recently having increased dizziness and falls after march CVA. Pt with improved gait and activity tolerance this session with use of hemiwalker and without dizziness this session but has been frequently present for therapy and throughout the day. Pt able to perform x 1 VOR exercises but limited by eyes burning and reports he was taking eye drops at baseline. Pt with improved gait distance but continues to rely on AD and cues for safety and sequence. Will continue to follow with D/C plan appropriate.     Recommendations for follow up therapy are one component of a multi-disciplinary discharge planning process, led by the attending physician.  Recommendations may be updated based on patient status, additional functional criteria and insurance authorization.  Follow Up Recommendations  Skilled nursing-short term rehab (<3 hours/day)     Assistance Recommended at Discharge PRN  Patient can return home with the following A little help with walking and/or transfers;A little help with bathing/dressing/bathroom;Assistance with cooking/housework;Assist for transportation;Help with stairs or ramp for entrance   Equipment Recommendations  Wheelchair (measurements PT);Wheelchair cushion (measurements PT);Other (comment) (hemiwalker)    Recommendations for Other Services       Precautions / Restrictions Precautions Precautions: Fall Precaution Comments:  cerebellar infarcts and vestibular schwannoma, limited RUE function Restrictions Weight Bearing Restrictions: No Other Position/Activity Restrictions: Unable to do most activities with RUE     Mobility  Bed Mobility               General bed mobility comments: in chair on arrival and end of session    Transfers Overall transfer level: Needs assistance Equipment used: Hemi-walker Transfers: Sit to/from Stand Sit to Stand: Supervision           General transfer comment: supervision for safety with decreased control of descent    Ambulation/Gait Ambulation/Gait assistance: Min guard Gait Distance (Feet): 300 Feet Assistive device: Hemi-walker Gait Pattern/deviations: Step-to pattern, Trunk flexed   Gait velocity interpretation: <1.8 ft/sec, indicate of risk for recurrent falls   General Gait Details: cues for looking up as pt with tendency to stare at the ground. pt with generally steady gait with assist of hemiwalker and able to complete horizontal head turns but limited by eyes burning. Pt with slow cautious gait and no reports of dizziness during this session   Stairs             Wheelchair Mobility    Modified Rankin (Stroke Patients Only)       Balance Overall balance assessment: Needs assistance   Sitting balance-Leahy Scale: Good     Standing balance support: Single extremity supported Standing balance-Leahy Scale: Poor Standing balance comment: hemiwalker for gait                            Cognition Arousal/Alertness: Awake/alert Behavior During Therapy: WFL for tasks assessed/performed Overall Cognitive Status: Within Functional Limits for tasks assessed  Exercises Other Exercises Other Exercises: VOR x1 horizontal and x 1 vertical 1 rep, 25 sec each limited by burning eyes and lack of focus on target    General Comments        Pertinent Vitals/Pain Pain  Assessment Pain Assessment: No/denies pain    Home Living                          Prior Function            PT Goals (current goals can now be found in the care plan section) Progress towards PT goals: Progressing toward goals    Frequency    Min 3X/week      PT Plan Current plan remains appropriate;Frequency needs to be updated    Co-evaluation              AM-PAC PT "6 Clicks" Mobility   Outcome Measure  Help needed turning from your back to your side while in a flat bed without using bedrails?: A Little Help needed moving from lying on your back to sitting on the side of a flat bed without using bedrails?: A Little Help needed moving to and from a bed to a chair (including a wheelchair)?: A Little Help needed standing up from a chair using your arms (e.g., wheelchair or bedside chair)?: A Little Help needed to walk in hospital room?: A Little Help needed climbing 3-5 steps with a railing? : Total 6 Click Score: 16    End of Session Equipment Utilized During Treatment: Gait belt Activity Tolerance: Patient tolerated treatment well Patient left: in chair;with call bell/phone within reach;with chair alarm set Nurse Communication: Mobility status PT Visit Diagnosis: History of falling (Z91.81);Other symptoms and signs involving the nervous system (R29.898)     Time: 4431-5400 PT Time Calculation (min) (ACUTE ONLY): 33 min  Charges:  $Gait Training: 8-22 mins $Therapeutic Activity: 8-22 mins                     Sumaiya Arruda P, PT Acute Rehabilitation Services Pager: [phone] Office: [place] 12/26/2021, 2:15 PM

## 2021-12-26 NOTE — TOC CM/SW Note (Incomplete Revision)
   HF TOC CM contacted SNF's  Spoke to [place], [place]-not in Teaching laboratory technician to [employer] -on hold with this plan Spoke to [place], [facility] -will review   [place], Heart Failure TOC CM [phone]

## 2021-12-26 NOTE — Progress Notes (Incomplete)
Occupational Therapy Treatment Patient Details Name: Craig Schmidt MRN: [id number] DOB: [birth year] Today's Date: 12/26/2021   History of present illness 51 y.o. male presents to [facility] hospital on 12/19/2021 with chest pain. EKG with ST elevation in ED. Pt underwent cardiac cath on 5/30, demonstrating occlusion of apical LAD, severe systolic dysfunction with EF less than 25%. Pt refusing right heart cath 6/2. 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   OT comments  Pt is homeless and living on street. RUE demonstrates decreased ROM, strength, and coordination post CVA in 09/2021. Balance is disrupted with sudden change in direction, and vision difficulties are apparent with functional tasks. Pt completed supine>sit transfer with min guard to ensure successful completion and sit<>stand transfer with Min A by providing hand over hand assist for RUE on walker and cues for safety. OT will follow pt during stay in acute to educate on strategies to ensure safety upon return to prior living environment despite chronic deficits. Reading glasses will be provided to pt for assistance with possible hyperopia.    Recommendations for follow up therapy are one component of a multi-disciplinary discharge planning process, led by the attending physician.  Recommendations may be updated based on patient status, additional functional criteria and insurance authorization.    Follow Up Recommendations  Skilled nursing-short term rehab (<3 hours/day)    Assistance Recommended at Discharge Intermittent Supervision/Assistance  Patient can return home with the following  A little help with walking and/or transfers;A little help with bathing/dressing/bathroom;Assistance with cooking/housework;Assist for transportation   Equipment Recommendations  Tub/shower bench    Recommendations for Other Services      Precautions /  Restrictions Precautions Precautions: Fall Precaution Comments: cerebellar infarcts and vestibular schwannoma Restrictions Weight Bearing Restrictions: No Other Position/Activity Restrictions: Unable to do most activities with RUE       Mobility Bed Mobility Overal bed mobility: Independent Bed Mobility: Sidelying to Sit   Sidelying to sit: Modified independent (Device/Increase time)            Transfers Overall transfer level: Needs assistance Equipment used: Rolling walker (2 wheels), 1 person hand held assist Transfers: Bed to chair/wheelchair/BSC Sit to Stand: Supervision                 Balance Overall balance assessment: History of Falls, Mild deficits observed, not formally tested                                         ADL either performed or assessed with clinical judgement   ADL Overall ADL's : Needs assistance/impaired Eating/Feeding: Set up;Sitting   Grooming: Set up;Sitting   Upper Body Bathing: Minimal assistance   Lower Body Bathing: Minimal assistance   Upper Body Dressing : Min guard   Lower Body Dressing: Minimal assistance   Toilet Transfer: Min guard   Toileting- Clothing Manipulation and Hygiene: Minimal assistance   Tub/ Shower Transfer: Minimal assistance   Functional mobility during ADLs: Minimal assistance      Extremity/Trunk Assessment Upper Extremity Assessment Upper Extremity Assessment: RUE deficits/detail RUE Deficits / Details: L CVA with deficts to R UE.  Limited hand function, unable to touch mouth, elbow in flexed position 90 degress, R shoulder sublux with little to no AROM. RUE: Shoulder pain with ROM RUE Coordination: decreased gross motor;decreased fine motor  Vision   Vision Assessment?: Vision impaired- to be further tested in functional context   Perception     Praxis      Cognition Arousal/Alertness: Awake/alert Behavior During Therapy: WFL for tasks  assessed/performed Overall Cognitive Status: Within Functional Limits for tasks assessed                                          Exercises      Shoulder Instructions       General Comments      Pertinent Vitals/ Pain          Home Living                                          Prior Functioning/Environment              Frequency  Min 3X/week        Progress Toward Goals  OT Goals(current goals can now be found in the care plan section)  Progress towards OT goals: Progressing toward goals     Plan Discharge plan remains appropriate    Co-evaluation                 AM-PAC OT "6 Clicks" Daily Activity     Outcome Measure   Help from another person eating meals?: A Little Help from another person taking care of personal grooming?: A Little Help from another person toileting, which includes using toliet, bedpan, or urinal?: A Little Help from another person bathing (including washing, rinsing, drying)?: A Little Help from another person to put on and taking off regular upper body clothing?: A Little Help from another person to put on and taking off regular lower body clothing?: A Little 6 Click Score: 18    End of Session Equipment Utilized During Treatment: Gait belt;Rolling walker (2 wheels)  OT Visit Diagnosis: Unsteadiness on feet (R26.81);Pain;Hemiplegia and hemiparesis;Muscle weakness (generalized) (M62.81);Dizziness and giddiness (R42);History of falling (Z91.81) Hemiplegia - Right/Left: Right Hemiplegia - dominant/non-dominant: Dominant Hemiplegia - caused by: Cerebral infarction Pain - Right/Left: Right Pain - part of body: Shoulder;Arm   Activity Tolerance Patient tolerated treatment well   Patient Left in chair;with call bell/phone within reach;with chair alarm set   Nurse Communication          Time: 10:50 -11:37     Charges:    Nilda Simmer OTS   Nilda Simmer 12/26/2021, 1:27  PM

## 2021-12-26 NOTE — TOC CM/SW Note (Addendum)
   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

## 2021-12-26 NOTE — Progress Notes (Addendum)
Patient ID: Craig Schmidt, male   DOB: [birth year], 51 y.o.   MRN: [id number]     Advanced Heart Failure Rounding Note  PCP-Cardiologist: Janina Mayo, MD   Subjective:    Creatinine down 1.81 => 1.67 =>1.64.=>1.8    Yesterday had an episode of dizziness. Denies SOB. He is ok to go to SNF.    Objective:   Weight Range: 74 kg Body mass index is 26.33 kg/m.   Vital Signs:   Temp:  [97.2 F (36.2 C)-97.4 F (36.3 C)] 97.2 F (36.2 C) (06/06 0620) Pulse Rate:  [92-97] 92 (06/06 0843) Resp:  [15-19] 15 (06/06 0620) BP: (122-125)/(68-75) 122/68 (06/06 0620) SpO2:  [99 %] 99 % (06/06 0620) Last BM Date : (P) 12/24/21  Weight change: Filed Weights   12/23/21 0534 12/24/21 0500 12/25/21 0500  Weight: 72.2 kg 73.8 kg 74 kg    Intake/Output:   Intake/Output Summary (Last 24 hours) at 12/26/2021 0930 Last data filed at 12/26/2021 0300 Gross per 24 hour  Intake --  Output 800 ml  Net -800 ml      Physical Exam   CVP 5  General:  Sitting on the side of the bed. No resp difficulty HEENT: normal Neck: supple. no JVD. Carotids 2+ bilat; no bruits. No lymphadenopathy or thryomegaly appreciated. Cor: PMI nondisplaced. Regular rate & rhythm. No rubs, gallops or murmurs. Lungs: clear Abdomen: soft, nontender, nondistended. No hepatosplenomegaly. No bruits or masses. Good bowel sounds. Extremities: no cyanosis, clubbing, rash, edema. RUE PICC Neuro: alert & orientedx3, cranial nerves grossly intact. moves all 4 extremities w/o difficulty. Affect pleasant   Telemetry   NSR 90-100s   EKG    N/A   Labs    CBC No results for input(s): WBC, NEUTROABS, HGB, HCT, MCV, PLT in the last 72 hours.  Basic Metabolic Panel Recent Labs    12/25/21 0500 12/26/21 0437  NA 134* 135  K 4.5 4.5  CL 102 103  CO2 24 20*  GLUCOSE 116* 125*  BUN 34* 34*  CREATININE 1.64* 1.84*  CALCIUM 9.6 9.6  MG 2.2  --    Liver Function Tests No results for input(s): AST, ALT, ALKPHOS,  BILITOT, PROT, ALBUMIN in the last 72 hours.  No results for input(s): LIPASE, AMYLASE in the last 72 hours. Cardiac Enzymes No results for input(s): CKTOTAL, CKMB, CKMBINDEX, TROPONINI in the last 72 hours.  BNP: BNP (last 3 results) Recent Labs    09/10/21 0400 12/19/21 1929  BNP 1,570.7* 809.0*    ProBNP (last 3 results) No results for input(s): PROBNP in the last 8760 hours.   D-Dimer No results for input(s): DDIMER in the last 72 hours. Hemoglobin A1C No results for input(s): HGBA1C in the last 72 hours.  Fasting Lipid Panel No results for input(s): CHOL, HDL, LDLCALC, TRIG, CHOLHDL, LDLDIRECT in the last 72 hours.  Thyroid Function Tests No results for input(s): TSH, T4TOTAL, T3FREE, THYROIDAB in the last 72 hours.  Invalid input(s): FREET3  Other results:   Imaging    No results found.   Medications:     Scheduled Medications:  apixaban  5 mg Oral BID   atorvastatin  80 mg Oral Daily   Chlorhexidine Gluconate Cloth  6 each Topical Daily   clopidogrel  75 mg Oral Daily   digoxin  0.125 mg Oral Daily   empagliflozin  10 mg Oral Daily   famotidine  20 mg Oral Daily   isosorbide-hydrALAZINE  1 tablet Oral TID  ivabradine  5 mg Oral BID WC   nicotine  7 mg Transdermal Daily   sodium chloride flush  10-40 mL Intracatheter Q12H   sodium chloride flush  3 mL Intravenous Q12H    Infusions:  sodium chloride 10 mL/hr at 12/19/21 1744   sodium chloride     sodium chloride      PRN Medications: sodium chloride, acetaminophen, ondansetron (ZOFRAN) IV, polyvinyl alcohol, sodium chloride flush, sodium chloride flush    Patient Profile   51 y.o. male with hx polysubstance abuse, homelessness, CVA 02/23, HTN, recent diagnosis HFrEF and mitral stenosis.    Admitted with acute anterolateral STEMI and a/c CHF  Assessment/Plan   1. Acute MI: Admission with inferolateral STEMI by ECG, cath showed occluded apical LAD.  Cannot rule out plaque rupture in  setting of cocaine abuse, but given history of suspected cardioembolic CVA, I wonder if this was not a cardioembolic MI.  No chest pain.  - Continue atorvastatin  - Continue Plavix (post-MI) + Eliquis.  Think he should remain anticoagulated (was supposed to be on prior to admission but not sure if he was taking) given suspected cardioembolism.  2. Acute/chronic systolic CHF: Patient was found to have cardiomyopathy in 2/23 at time of admission for CVA.  Echo showed EF 25-30% at that time.  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  Echo this admission showed LV EF < 20%, RV mildly decreased function, abnormal mitral valve looks rheumatic => suspect moderate mitral stenosis at least with planimetered MVA 1.6 cm^2 and calculated MVA by VTI 0.8 cm^2; mean gradient only 6 mmHg but likely function of low cardiac output. The IVC was small/non-dilated. On conversation with him, I suspect he was not taking his medications correctly at home.  He developed AKI after starting GDMT here and getting diuresed.  Creatinine up to 2.0 and hypotensive (now improved).  Echo showed a small/nondilated IVC and REDS clip was normal at 31%.   - Volume status stable.  - Continue digoxin 0.125.  - Continue Bidil 1 tab tid. - Continue corlanor 5 mg twice a day.  - Continue Farxiga 10 mg daily.   - Not a candidate for advanced therapies or home inotropes with active substance abuse (used cocaine just prior to admission).  3. CVA: 2/23, has residual right-sided weakness.  Thought to be cardioembolic at the time, started on anticoagulation.   - Use Eliquis given concerns with compliance with warfarin INR checks.  4. Substance abuse: Urged him to quit cocaine.  5. 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  I reviewed this admission's echo, it shows moderate mitral stenosis at least with planimetered MVA 1.6 cm^2 and calculated MVA by VTI  0.8 cm^2; mean gradient only 6 mmHg but possibly function of low cardiac output.  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  Unusual given age. Hemodynamic LHC/RHC eventually would be helpful for full evaluation of mitral stenosis but he is refusing today and not currently a good surgical candidate with severe biventricular failure (though will need to review prior TEE for valvuloplasty candidacy).  - Not currently candidate for valve replacement with substance abuse and markedly low LV function.   6. Homeless: SW working on SNF.  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  - Continue to work with PT, use walker.  8. Ventricular Trigeminy - K 4.5, Check Mg supp if needed   SW working on  placement. Can go when bed available.   Length of Stay: [place], NP  12/26/2021, 9:30 AM  Advanced Heart Failure Team Pager [phone] (M-F; 7a - 5p)  Please contact [facility] for night-coverage after hours (5p -7a ) and weekends on [email or web address]  Patient seen with NP, agree with the above note.   He is stable today symptomatically, no dyspnea.  Co-ox 78%.   General: NAD Neck: No JVD, no thyromegaly or thyroid nodule.  Lungs: Clear to auscultation bilaterally with normal respiratory effort. CV: Nondisplaced PMI.  Heart regular S1/S2, no S3/S4, no murmur.  No peripheral edema.   Abdomen: Soft, nontender, no hepatosplenomegaly, no distention.  Skin: Intact without lesions or rashes.  Neurologic: Alert and oriented x 3.  Psych: Normal affect. Extremities: No clubbing or cyanosis.  HEENT: Normal.   Creatinine up to 1.84 (mild rise), volume status looks ok.  BP stable. Good co-ox. Would not change his current cardiac regimen.   From my standpoint, he is ready for discharge from the hospital.  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   Loralie Champagne 12/26/2021 9:52 AM

## 2021-12-26 NOTE — Progress Notes (Deleted)
Occupational Therapy Treatment Patient Details Name: Craig Schmidt MRN: [id number] DOB: 1971-01-23 Today's Date: 12/26/2021   History of present illness 51 y.o. male presents to [facility] hospital on 12/19/2021 with chest pain. EKG with ST elevation in ED. Pt underwent cardiac cath on 5/30, demonstrating occlusion of apical LAD, severe systolic dysfunction with EF less than 25%. Pt refusing right heart cath 6/2. 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   OT comments  Pt is homeless and living on street. RUE demonstrates decreased ROM, strength, and coordination post CVA in 09/2021. Balance is disrupted with sudden change in direction, and vision difficulties are apparent with functional tasks. Pt completed supine>sit transfer with min guard to ensure successful completion and sit<>stand transfer with Min A by providing hand over hand assist for RUE on walker and cues for safety. OT will follow pt during stay in acute to educate on strategies to ensure safety upon return to prior living environment despite chronic deficits. Reading glasses will be provided to pt for assistance with possible hyperopia.     Recommendations for follow up therapy are one component of a multi-disciplinary discharge planning process, led by the attending physician.  Recommendations may be updated based on patient status, additional functional criteria and insurance authorization.    Follow Up Recommendations  Skilled nursing-short term rehab (<3 hours/day)    Assistance Recommended at Discharge Intermittent Supervision/Assistance  Patient can return home with the following  A little help with walking and/or transfers;A little help with bathing/dressing/bathroom;Assistance with cooking/housework;Assist for transportation   Equipment Recommendations  Tub/shower bench    Recommendations for Other Services      Precautions /  Restrictions Precautions Precautions: Fall Precaution Comments: cerebellar infarcts and vestibular schwannoma Restrictions Weight Bearing Restrictions: No Other Position/Activity Restrictions: Unable to do most activities with RUE       Mobility Bed Mobility Overal bed mobility: Independent Bed Mobility: Sidelying to Sit   Sidelying to sit: Modified independent (Device/Increase time)            Transfers Overall transfer level: Needs assistance Equipment used: Rolling walker (2 wheels), 1 person hand held assist Transfers: Bed to chair/wheelchair/BSC Sit to Stand: Supervision                 Balance Overall balance assessment: History of Falls, Mild deficits observed, not formally tested                                         ADL either performed or assessed with clinical judgement   ADL Overall ADL's : Needs assistance/impaired Eating/Feeding: Set up;Sitting   Grooming: Set up;Sitting   Upper Body Bathing: Minimal assistance   Lower Body Bathing: Minimal assistance   Upper Body Dressing : Min guard   Lower Body Dressing: Minimal assistance   Toilet Transfer: Min guard   Toileting- Clothing Manipulation and Hygiene: Minimal assistance   Tub/ Shower Transfer: Minimal assistance   Functional mobility during ADLs: Minimal assistance      Extremity/Trunk Assessment Upper Extremity Assessment Upper Extremity Assessment: RUE deficits/detail RUE Deficits / Details: L CVA with deficts to R UE.  Limited hand function, unable to touch mouth, elbow in flexed position 90 degress, R shoulder sublux with little to no AROM. RUE: Shoulder pain with ROM RUE Coordination: decreased gross motor;decreased fine motor  Vision   Vision Assessment?: Vision impaired- to be further tested in functional context   Perception     Praxis      Cognition Arousal/Alertness: Awake/alert Behavior During Therapy: WFL for tasks  assessed/performed Overall Cognitive Status: Within Functional Limits for tasks assessed                                          Exercises      Shoulder Instructions       General Comments      Pertinent Vitals/ Pain          Home Living                                          Prior Functioning/Environment              Frequency  Min 3X/week        Progress Toward Goals  OT Goals(current goals can now be found in the care plan section)  Progress towards OT goals: Progressing toward goals     Plan Discharge plan remains appropriate    Co-evaluation                 AM-PAC OT "6 Clicks" Daily Activity     Outcome Measure   Help from another person eating meals?: A Little Help from another person taking care of personal grooming?: A Little Help from another person toileting, which includes using toliet, bedpan, or urinal?: A Little Help from another person bathing (including washing, rinsing, drying)?: A Little Help from another person to put on and taking off regular upper body clothing?: A Little Help from another person to put on and taking off regular lower body clothing?: A Little 6 Click Score: 18    End of Session Equipment Utilized During Treatment: Gait belt;Rolling walker (2 wheels)  OT Visit Diagnosis: Unsteadiness on feet (R26.81);Pain;Hemiplegia and hemiparesis;Muscle weakness (generalized) (M62.81);Dizziness and giddiness (R42);History of falling (Z91.81) Hemiplegia - Right/Left: Right Hemiplegia - dominant/non-dominant: Dominant Hemiplegia - caused by: Cerebral infarction Pain - Right/Left: Right Pain - part of body: Shoulder;Arm   Activity Tolerance Patient tolerated treatment well   Patient Left in chair;with call bell/phone within reach;with chair alarm set   Nurse Communication          Time:  10:50-11:37     Charges:    Nilda Simmer OTS  Nilda Simmer 12/26/2021, 1:08  PM

## 2021-12-26 NOTE — Progress Notes (Signed)
Occupational Therapy Treatment Patient Details Name: Craig Schmidt MRN: [id number] DOB: [birth year] Today's Date: 12/26/2021   History of present illness 51 y.o. male admitted 12/19/2021 with chest pain and STEMI. Cardiac cath 5/30, demonstrating occlusion of apical LAD, severe systolic dysfunction with EF less than 25%. Pt refusing right heart cath 6/2. 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   OT comments  Pt is homeless and living on street. Prior to admission, pt reports 3 falls that resulted in difficulty returning to feet due to balance and RUE deficits. Pt reports an increase in dizziness since CVA in 09/2021 resulting in difficulty with balance that is interfering with daily tasks such as safe mobility including crossing the street and getting to desired destinations, completing ADLs, and transfers or changing positions.      Recommendations for follow up therapy are one component of a multi-disciplinary discharge planning process, led by the attending physician.  Recommendations may be updated based on patient status, additional functional criteria and insurance authorization.    Follow Up Recommendations  Skilled nursing-short term rehab (<3 hours/day)    Assistance Recommended at Discharge Frequent or constant Supervision/Assistance  Patient can return home with the following  A little help with walking and/or transfers;A little help with bathing/dressing/bathroom;Assistance with cooking/housework;Assist for transportation   Equipment Recommendations  Tub/shower bench Photographer)    Recommendations for Other Services      Precautions / Restrictions Precautions Precautions: Fall Precaution Comments: cerebellar infarcts and vestibular schwannoma, limited RUE function Restrictions Weight Bearing Restrictions: No Other Position/Activity Restrictions: Unable to do most activities with RUE        Mobility Bed Mobility Overal bed mobility: Independent Bed Mobility: Sidelying to Sit   Sidelying to sit: Modified independent (Device/Increase time)            Transfers Overall transfer level: Needs assistance Equipment used: Rolling walker (2 wheels), 1 person hand held assist Transfers: Bed to chair/wheelchair/BSC Sit to Stand: Supervision                 Balance Overall balance assessment: History of Falls, Mild deficits observed, not formally tested (Pt noted 3 recent falls, difficulty getting up after)                                         ADL either performed or assessed with clinical judgement   ADL Overall ADL's : Needs assistance/impaired Eating/Feeding: Set up;Sitting   Grooming: Set up;Sitting   Upper Body Bathing: Minimal assistance   Lower Body Bathing: Minimal assistance   Upper Body Dressing : Min guard   Lower Body Dressing: Minimal assistance   Toilet Transfer: Min guard   Toileting- Clothing Manipulation and Hygiene: Minimal assistance   Tub/ Shower Transfer: Minimal assistance   Functional mobility during ADLs: Minimal assistance      Extremity/Trunk Assessment Upper Extremity Assessment Upper Extremity Assessment: RUE deficits/detail RUE Deficits / Details: RUE provides stabilization and functional assist, unable to touch mouth, elbow in flexed position 90 degress, R shoulder sublux with little to no AROM. RUE: Shoulder pain with ROM RUE Coordination: decreased gross motor;decreased fine motor            Vision   Vision Assessment?: Vision impaired- to be further tested in functional context Additional Comments: Will provide reading glasses for assistance, complains of dry eyes  Perception Perception Perception: Impaired   Praxis Praxis Praxis: Impaired    Cognition Arousal/Alertness: Awake/alert Behavior During Therapy: WFL for tasks assessed/performed Overall Cognitive Status: Within  Functional Limits for tasks assessed (Difficulty staying on task)                                          Exercises      Shoulder Instructions       General Comments Pt disclosed he lives on street eith girlfriend and dog, sleeps in lit area for safety. Pt opts not to live with family members and has unsuccesfully attempted to gain housing through [facility]. Pt did not recieve therapy after CVA in 09/2021, leaving him with functional deficits in RUE.    Pertinent Vitals/ Pain          Home Living                                          Prior Functioning/Environment              Frequency  Min 2X/week        Progress Toward Goals  OT Goals(current goals can now be found in the care plan section)  Progress towards OT goals: Progressing toward goals     Plan Discharge plan needs to be updated    Co-evaluation                 AM-PAC OT "6 Clicks" Daily Activity     Outcome Measure   Help from another person eating meals?: A Little Help from another person taking care of personal grooming?: A Little Help from another person toileting, which includes using toliet, bedpan, or urinal?: A Little Help from another person bathing (including washing, rinsing, drying)?: A Little Help from another person to put on and taking off regular upper body clothing?: A Little Help from another person to put on and taking off regular lower body clothing?: A Little 6 Click Score: 18    End of Session Equipment Utilized During Treatment: Gait belt;Rolling walker (2 wheels)  OT Visit Diagnosis: Unsteadiness on feet (R26.81);Pain;Hemiplegia and hemiparesis;Muscle weakness (generalized) (M62.81);Dizziness and giddiness (R42);History of falling (Z91.81) Hemiplegia - Right/Left: Right Hemiplegia - dominant/non-dominant: Dominant Hemiplegia - caused by: Cerebral infarction (09/2021) Pain - Right/Left: Right Pain - part of body: Shoulder;Arm    Activity Tolerance Patient tolerated treatment well   Patient Left in chair;with call bell/phone within reach;with chair alarm set   Nurse Communication Other (comment) (Pt complains of dry eyes, nurse messaged for eye drops)        Time: 10:50 -11:37     Charges:    Nilda Simmer OTS  Nilda Simmer 12/26/2021, 2:23 PM

## 2021-12-27 ENCOUNTER — Other Ambulatory Visit (HOSPITAL_COMMUNITY): Payer: Self-pay

## 2021-12-27 ENCOUNTER — Telehealth (HOSPITAL_COMMUNITY): Payer: Self-pay | Admitting: Licensed Clinical Social Worker

## 2021-12-27 LAB — URINE DRUGS OF ABUSE SCREEN W ALC, ROUTINE (REF LAB)
Amphetamines, Urine: NEGATIVE ng/mL
Barbiturate, Ur: NEGATIVE ng/mL
Cannabinoid Quant, Ur: NEGATIVE ng/mL
Ethanol U, Quan: NEGATIVE %
Methadone Screen, Urine: NEGATIVE ng/mL
Opiate Quant, Ur: NEGATIVE ng/mL
Phencyclidine, Ur: NEGATIVE ng/mL
Propoxyphene, Urine: NEGATIVE ng/mL

## 2021-12-27 LAB — BASIC METABOLIC PANEL
Anion gap: 11 (ref 5–15)
BUN: 29 mg/dL — ABNORMAL HIGH (ref 6–20)
CO2: 23 mmol/L (ref 22–32)
Calcium: 9.4 mg/dL (ref 8.9–10.3)
Chloride: 100 mmol/L (ref 98–111)
Creatinine, Ser: 1.75 mg/dL — ABNORMAL HIGH (ref 0.61–1.24)
GFR, Estimated: 47 mL/min — ABNORMAL LOW (ref >60)
Glucose, Bld: 169 mg/dL — ABNORMAL HIGH (ref 70–99)
Potassium: 3.8 mmol/L (ref 3.5–5.1)
Sodium: 134 mmol/L — ABNORMAL LOW (ref 135–145)

## 2021-12-27 LAB — DIGOXIN LEVEL: Digoxin Level: 0.4 ng/mL — ABNORMAL LOW (ref 0.8–2.0)

## 2021-12-27 LAB — DRUG PROFILE 799031: BENZODIAZEPINES: NEGATIVE

## 2021-12-27 LAB — COCAINE CONF, UR
Benzoylecgonine GC/MS Conf: 7840 ng/mL
Cocaine Metab Quant, Ur: POSITIVE — AB

## 2021-12-27 MED ORDER — ISOSORB DINITRATE-HYDRALAZINE 20-37.5 MG PO TABS
1.0000 | ORAL_TABLET | Freq: Three times a day (TID) | ORAL | 6 refills | Status: DC
  Filled 2021-12-27: qty 90, 30d supply, fill #0

## 2021-12-27 MED ORDER — GLYCERIN-HYPROMELLOSE-PEG 400 0.2-0.2-1 % OP SOLN
1.0000 [drp] | OPHTHALMIC | 0 refills | Status: DC | PRN
Start: 2021-12-27 — End: 2022-12-05
  Filled 2021-12-27: qty 15, 15d supply, fill #0

## 2021-12-27 MED ORDER — CLOPIDOGREL BISULFATE 75 MG PO TABS
75.0000 mg | ORAL_TABLET | Freq: Every day | ORAL | 6 refills | Status: DC
Start: 2021-12-28 — End: 2022-07-10
  Filled 2021-12-27: qty 30, 30d supply, fill #0

## 2021-12-27 MED ORDER — IVABRADINE HCL 5 MG PO TABS
5.0000 mg | ORAL_TABLET | Freq: Two times a day (BID) | ORAL | 6 refills | Status: DC
Start: 2021-12-27 — End: 2022-03-21
  Filled 2021-12-27: qty 60, 30d supply, fill #0

## 2021-12-27 MED ORDER — POTASSIUM CHLORIDE CRYS ER 20 MEQ PO TBCR: 40.0000 meq | EXTENDED_RELEASE_TABLET | Freq: Once | ORAL | Status: DC

## 2021-12-27 MED ORDER — FAMOTIDINE 20 MG PO TABS
20.0000 mg | ORAL_TABLET | Freq: Every day | ORAL | 6 refills | Status: DC
Start: 2021-12-28 — End: 2022-08-17
  Filled 2021-12-27: qty 30, 30d supply, fill #0

## 2021-12-27 MED ORDER — EMPAGLIFLOZIN 10 MG PO TABS
10.0000 mg | ORAL_TABLET | Freq: Every day | ORAL | 6 refills | Status: DC
  Filled 2021-12-27: qty 30, 30d supply, fill #0

## 2021-12-27 MED ORDER — ATORVASTATIN CALCIUM 80 MG PO TABS
80.0000 mg | ORAL_TABLET | Freq: Every day | ORAL | 11 refills | Status: DC
  Filled 2021-12-27: qty 30, 30d supply, fill #0

## 2021-12-27 MED ORDER — ATORVASTATIN CALCIUM 40 MG PO TABS
40.0000 mg | ORAL_TABLET | Freq: Every day | ORAL | 6 refills | Status: DC
Start: 2021-12-27 — End: 2021-12-27
  Filled 2021-12-27: qty 30, 30d supply, fill #0

## 2021-12-27 MED ORDER — APIXABAN 5 MG PO TABS
5.0000 mg | ORAL_TABLET | Freq: Two times a day (BID) | ORAL | 5 refills | Status: DC
  Filled 2021-12-27: qty 60, 30d supply, fill #0

## 2021-12-27 MED ORDER — POTASSIUM CHLORIDE CRYS ER 20 MEQ PO TBCR
20.0000 meq | EXTENDED_RELEASE_TABLET | Freq: Once | ORAL | Status: AC
  Administered 2021-12-27: 20 meq via ORAL
  Filled 2021-12-27: qty 1

## 2021-12-27 MED ORDER — DIGOXIN 125 MCG PO TABS
0.1250 mg | ORAL_TABLET | Freq: Every day | ORAL | 6 refills | Status: DC
Start: 2021-12-28 — End: 2022-08-17
  Filled 2021-12-27: qty 30, 30d supply, fill #0

## 2021-12-27 NOTE — Progress Notes (Addendum)
Patient ID: Craig Schmidt, male   DOB: 1971-06-02, 51 y.o.   MRN: [id number]     Advanced Heart Failure Rounding Note  PCP-Cardiologist: Janina Mayo, MD   Subjective:    Denies SOB. Asking about his dog.    Objective:   Weight Range: 71 kg Body mass index is 25.26 kg/m.   Vital Signs:   Temp:  [97.9 F (36.6 C)-98.4 F (36.9 C)] 97.9 F (36.6 C) (06/07 0452) Pulse Rate:  [88-92] 92 (06/07 0918) Resp:  [17-19] 17 (06/07 0918) BP: (109-135)/(70-84) 135/84 (06/07 0918) SpO2:  [97 %-100 %] 97 % (06/07 0918) Weight:  [71 kg] 71 kg (06/07 0452) Last BM Date : 12/26/21  Weight change: Filed Weights   12/24/21 0500 12/25/21 0500 12/27/21 0452  Weight: 73.8 kg 74 kg 71 kg    Intake/Output:   Intake/Output Summary (Last 24 hours) at 12/27/2021 0947 Last data filed at 12/27/2021 0900 Gross per 24 hour  Intake 240 ml  Output --  Net 240 ml      Physical Exam  General: Sitting in the chair. No resp difficulty HEENT: normal Neck: supple. no JVD. Carotids 2+ bilat; no bruits. No lymphadenopathy or thryomegaly appreciated. Cor: PMI nondisplaced. Regular rate & rhythm. No rubs, gallops or murmurs. Lungs: clear Abdomen: soft, nontender, nondistended. No hepatosplenomegaly. No bruits or masses. Good bowel sounds. Extremities: no cyanosis, clubbing, rash, edema Neuro: alert & orientedx3, cranial nerves grossly intact. moves all 4 extremities w/o difficulty. Affect pleasant  Telemetry   SR 80-90s   EKG    N/A   Labs    CBC No results for input(s): WBC, NEUTROABS, HGB, HCT, MCV, PLT in the last 72 hours.  Basic Metabolic Panel Recent Labs    12/25/21 0500 12/26/21 0437 12/27/21 0404  NA 134* 135 134*  K 4.5 4.5 3.8  CL 102 103 100  CO2 24 20* 23  GLUCOSE 116* 125* 169*  BUN 34* 34* 29*  CREATININE 1.64* 1.84* 1.75*  CALCIUM 9.6 9.6 9.4  MG 2.2  --   --    Liver Function Tests No results for input(s): AST, ALT, ALKPHOS, BILITOT, PROT, ALBUMIN in the  last 72 hours.  No results for input(s): LIPASE, AMYLASE in the last 72 hours. Cardiac Enzymes No results for input(s): CKTOTAL, CKMB, CKMBINDEX, TROPONINI in the last 72 hours.  BNP: BNP (last 3 results) Recent Labs    09/10/21 0400 12/19/21 1929  BNP 1,570.7* 809.0*    ProBNP (last 3 results) No results for input(s): PROBNP in the last 8760 hours.   D-Dimer No results for input(s): DDIMER in the last 72 hours. Hemoglobin A1C No results for input(s): HGBA1C in the last 72 hours.  Fasting Lipid Panel No results for input(s): CHOL, HDL, LDLCALC, TRIG, CHOLHDL, LDLDIRECT in the last 72 hours.  Thyroid Function Tests No results for input(s): TSH, T4TOTAL, T3FREE, THYROIDAB in the last 72 hours.  Invalid input(s): FREET3  Other results:   Imaging    No results found.   Medications:     Scheduled Medications:  apixaban  5 mg Oral BID   atorvastatin  80 mg Oral Daily   Chlorhexidine Gluconate Cloth  6 each Topical Daily   clopidogrel  75 mg Oral Daily   digoxin  0.125 mg Oral Daily   empagliflozin  10 mg Oral Daily   famotidine  20 mg Oral Daily   isosorbide-hydrALAZINE  1 tablet Oral TID   ivabradine  5 mg Oral BID WC  nicotine  7 mg Transdermal Daily   sodium chloride flush  10-40 mL Intracatheter Q12H   sodium chloride flush  3 mL Intravenous Q12H    Infusions:  sodium chloride 10 mL/hr at 12/19/21 1744   sodium chloride     sodium chloride      PRN Medications: sodium chloride, acetaminophen, ondansetron (ZOFRAN) IV, polyvinyl alcohol, sodium chloride flush, sodium chloride flush    Patient Profile   51 y.o. male with hx polysubstance abuse, homelessness, CVA 02/23, HTN, recent diagnosis HFrEF and mitral stenosis.    Admitted with acute anterolateral STEMI and a/c CHF  Assessment/Plan   1. Acute MI: Admission with inferolateral STEMI by ECG, cath showed occluded apical LAD.  Cannot rule out plaque rupture in setting of cocaine abuse, but  given history of suspected cardioembolic CVA, I wonder if this was not a cardioembolic MI.  No chest pain.  - Continue atorvastatin  - Continue Plavix (post-MI) + Eliquis.  Think he should remain anticoagulated (was supposed to be on prior to admission but not sure if he was taking) given suspected cardioembolism.  2. Acute/chronic systolic CHF: Patient was found to have cardiomyopathy in 2/23 at time of admission for CVA.  Echo showed EF 25-30% at that time.  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  Echo this admission showed LV EF < 20%, RV mildly decreased function, abnormal mitral valve looks rheumatic => suspect moderate mitral stenosis at least with planimetered MVA 1.6 cm^2 and calculated MVA by VTI 0.8 cm^2; mean gradient only 6 mmHg but likely function of low cardiac output. The IVC was small/non-dilated. On conversation with him, I suspect he was not taking his medications correctly at home.  He developed AKI after starting GDMT here and getting diuresed.  Creatinine up to 2.0 and hypotensive (now improved).  Echo showed a small/nondilated IVC and REDS clip was normal at 31%.   - Volume status stable.  - Continue digoxin 0.125. 0.4  - Continue Bidil 1 tab tid. - Continue corlanor 5 mg twice a day.  - Continue Farxiga 10 mg daily.   - Not a candidate for advanced therapies or home inotropes with active substance abuse (used cocaine just prior to admission).  3. CVA: 2/23, has residual right-sided weakness.  Thought to be cardioembolic at the time, started on anticoagulation.   - Use Eliquis given concerns with compliance with warfarin INR checks.  4. Substance abuse: Urged him to quit cocaine.  5. 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  I reviewed this admission's echo, it shows moderate mitral stenosis at least with planimetered MVA 1.6 cm^2 and calculated MVA by VTI 0.8 cm^2; mean gradient only  6 mmHg but possibly function of low cardiac output.  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  Unusual given age. Hemodynamic LHC/RHC eventually would be helpful for full evaluation of mitral stenosis but he is refusing today and not currently a good surgical candidate with severe biventricular failure (though will need to review prior TEE for valvuloplasty candidacy).  - Not currently candidate for valve replacement with substance abuse and markedly low LV function.   6. Homeless: SW working on SNF.  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  - Continue to work with PT, use walker.  8. Ventricular Trigeminy - K 3.8  Check Mg supp if needed   SW working on placement. Can go when bed available.  He has had 32 denials for SNF.   Remove PICC today.   Length of Stay: [place], NP  12/27/2021, 9:47 AM  Advanced Heart Failure Team Pager [phone] (M-F; 7a - 5p)  Please contact [facility] for night-coverage after hours (5p -7a ) and weekends on [email or web address]  Patient seen with NP, agree with the above note.   He is clinically stable today, creatinine 1.75.  BP ok.   General: NAD Neck: No JVD, no thyromegaly or thyroid nodule.  Lungs: Clear to auscultation bilaterally with normal respiratory effort. CV: Lateral PMI.  Heart regular S1/S2, no S3/S4, no murmur.  No peripheral edema.   Abdomen: Soft, nontender, no hepatosplenomegaly, no distention.  Skin: Intact without lesions or rashes.  Neurologic: Alert and oriented x 3.  Psych: Normal affect. Extremities: No clubbing or cyanosis.  HEENT: Normal.   Would continue current regimen today without change.  He does not look volume overloaded. Digoxin level ok.   Still trying to find placement.  Ready for discharge.   Loralie Champagne 12/27/2021 10:48 AM
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
## 2021-12-27 NOTE — TOC Transition Note (Addendum)
Transition of Care [facility]) - CM/SW Discharge Note   Patient Details  Name: Craig Schmidt MRN: [id number] Date of Birth: January 14, 1971  Transition of Care [facility]) CM/SW Contact:  Nicholus Chandran, LCSW Phone Number: 12/27/2021, 4:54 PM   Clinical Narrative:    Craig Schmidt will discharge to the [facility] Room 105 [phone] and will use a taxi voucher for transportation. DME did not get delivered to the room and CSW called Zach with [employer] and was able to get the rollator delivered to the hotel for Craig Schmidt.  CSW will sign off for now as social work intervention is no longer needed. Please consult [place] again if new needs arise.   Final next level of care: Home/Self Care Barriers to Discharge: No Barriers Identified   Patient Goals and CMS Choice Patient states their goals for this hospitalization and ongoing recovery are:: agreeable to SNF or ALF CMS Medicare.gov Compare Post Acute Care list provided to:: Patient Choice offered to / list presented to : Patient  Discharge Placement                  Name of family member notified: Criss Alvine Patient and family notified of of transfer: 12/27/21  Discharge Plan and Services   Discharge Planning Services: CM Consult Post Acute Care Choice: [place]                               Social Determinants of Health (SDOH) Interventions     Readmission Risk Interventions     View : No data to display.           Benito Lemmerman, MSW, LCSW [phone] Heart Failure Social Worker

## 2021-12-27 NOTE — Progress Notes (Signed)
OT Note Addendum    12/26/21 1230  OT Visit Information  Last OT Received On 12/27/21  OT Time Calculation  OT Start Time (ACUTE ONLY) 1050  OT Stop Time (ACUTE ONLY) 1137  OT Time Calculation (min) 47 min  OT General Charges  $OT Visit 1 Visit  OT Treatments  $Self Care/Home Management  38-52 mins   Maurie Boettcher, OT/L   Acute OT Clinical Specialist [place] Pager [phone] Office [phone]

## 2021-12-27 NOTE — TOC Benefit Eligibility Note (Signed)
Patient Advocate Encounter  Prior Authorization for Jardiance '10MG'$  tablets has been approved.    PA# [id number] KEY: [id number] Effective dates: 12/27/2021 through 12/27/2022      Lyndel Safe, [place] Patient Advocate Specialist [place] Patient Advocate Team Direct Number: [phone]  Fax: [phone]

## 2021-12-27 NOTE — Care Management (Signed)
1623 12-27-21 Case Manager received notification that the patient will need a hemi-walker prior to discharge. Case Manager placed the order for DME and called [employer] for the referral. DME will be delivered to the room prior to transition to the motel. No further needs from Case Manager at this time.

## 2021-12-28 ENCOUNTER — Telehealth: Payer: Self-pay | Admitting: *Deleted

## 2021-12-28 ENCOUNTER — Other Ambulatory Visit (HOSPITAL_COMMUNITY): Payer: Self-pay

## 2021-12-28 NOTE — Telephone Encounter (Cosign Needed)
Patient suffers from diagnosis which impairs their ability to perform daily activities like bathing, walking, standing in the home.  A cane or rolling walker will not resolve issue with performing activities of daily living. A wheelchair will allow patient to safely perform daily activities. Patient is not able to propel themselves in the home using a standard weight wheelchair due to general weakness. Patient can self propel in the lightweight wheelchair.

## 2021-12-28 NOTE — Progress Notes (Signed)
Heart and Vascular Care Navigation  12/28/2021  Craig Schmidt 11/27/1970 [id number]  Reason for Referral: housing concerns Patient is participating in a Managed Medicaid Plan:Yes  Engaged with patient face to face for initial visit for Heart and Vascular Care Coordination.                                                                                                   Assessment:  CSW consulted to assist with patient disposition.  Per report pt currently homeless staying outside in a tent.  Per chart review VISPDAT already submitted to [facility] by inpatient CSW during previous admission in March- pt reports he has not heard back regarding this referral.  Inpatient HF TOC CSW had pt sign release forms with [facility] so we could discuss his case with them- this CSW sent in those forms and emailed [place] team to inquire about pt status with their organization and ability to be connected to case management services at this time- awaiting response.  Given pt current condition HF team does not feel pt is safe to DC back to the streets at this time.  CSW contacted pt [facility] team to inquire about paying for a hotel- they have sent in a referral to their team to see if pt is eligible for post hospital stay at hotel- would pay up to $500- report that referral could take up to 3 days to get response.  CSW consulted with [employer] and [employer] approved 6 nights at [facility] while we attempt to figure out next steps for patient for long term housing.  Inpatient HF TOC CSW assisting with transitioning pt over to hotel this afternoon.  CSW also discussed paramedicine referral with pt- he is agreeable to enrollment in this program- CSW sending out referral for assignment.                               HRT/VAS Care Coordination     Living arrangements for the past 2 months Homeless   Lives with: [place] Devices/Equipment None   DME [place]; [place]       Social History:                                                                             SDOH Screenings   Alcohol Screen: Medium Risk (09/19/2021)   Alcohol Screen    Last Alcohol Screening Score (AUDIT): 16  Depression (PHQ2-9): Not on file  Financial Resource Strain: High Risk (12/28/2021)   Overall Financial Resource Strain (CARDIA)    Difficulty of Paying Living Expenses: Very hard  Food Insecurity: No Food Insecurity (09/19/2021)   Hunger  Vital Sign    Worried About Charity fundraiser in the Last Year: Never true    Ran Out of Food in the Last Year: Never true  Housing: High Risk (12/28/2021)   Housing    Last Housing Risk Score: 3  Physical Activity: Not on file  Social Connections: Not on file  Stress: Not on file  Tobacco Use: High Risk (12/20/2021)   Patient History    Smoking Tobacco Use: Every Day    Smokeless Tobacco Use: Never    Passive Exposure: Past  Transportation Needs: Unmet Transportation Needs (09/19/2021)   PRAPARE - Hydrologist (Medical): Yes    Lack of Transportation (Non-Medical): Yes    SDOH Interventions: Financial Resources:  Financial Strain Interventions: Other (Comment) (patient care fund for hotel stay) [place] for Disability application assistance  Food Insecurity:   Not assessed at this time  Housing Insecurity:  Housing Interventions: Other (Comment) (Patient care fund for hotel stay and referral to [place] for housing resources)  Transportation:    No source of transportation- would be eligible for medicaid transport but no stable address   Follow-up plan:    CSW sending out referral to paramedics for assignment  CSW will continue to follow pt and assist in finding resources for housing.  Jorge Ny, LCSW Clinical Social Worker Advanced Heart Failure Clinic Desk#: [phone] Cell#: [phone]

## 2021-12-28 NOTE — TOC CM/SW Note (Signed)
HF TOC CM sent order and note to [place] for wheelchair. They will pick up walker and deliver wheelchair. [place], Heart Failure TOC CM [phone]

## 2021-12-28 NOTE — TOC CM/SW Note (Signed)
12/28/2021 250 pm Pt cannot tolerate Rolling walker, requesting wheelchair. [place] for wheelchair. Received signed order by attending. [employer] will deliver wheelchair and pick up rolling walker. [place], Heart Failure TOC CM [phone]

## 2021-12-28 NOTE — TOC CM/SW Note (Cosign Needed Addendum)
Patient suffers from diagnosis which impairs their ability to perform daily activities like bathing, walking, standing in the home.  A cane or rolling walker will not resolve issue with performing activities of daily living. A wheelchair will allow patient to safely perform daily activities. Patient is not able to propel themselves in the home using a standard weight wheelchair due to general weakness. Patient can self propel in the lightweight wheelchair.

## 2021-12-29 ENCOUNTER — Telehealth (HOSPITAL_COMMUNITY): Payer: Self-pay | Admitting: Licensed Clinical Social Worker

## 2021-12-29 ENCOUNTER — Telehealth: Payer: Self-pay | Admitting: Internal Medicine

## 2021-12-29 NOTE — Progress Notes (Unsigned)
Heart and Vascular Care Navigation  12/29/2021  Craig Schmidt [birth year] [id number]  Reason for Referral: homelessness and medical supply needs Patient is participating in a Managed Medicaid Plan:Yes  Engaged with patient by telephone for follow up visit for Heart and Vascular Care Coordination.                                                                                                   Assessment:     CSW called pt to check in and discuss current efforts to find pt permanent housing solution.  Pt reports he has several current needs.  Reports that diuretics have been making him have to pee really badly especially at night and he was incontinent the other night because of it- wanting pee pads that he can sleep on to prevent wetting the bed.  CSW also discussed him using bedside urinal since he is not stable with vertigo and physical limitations so might take an effort to get to the toilet- he would appreciate this as well.  Also states he left his med bag and pillcutter at hospital.  CSW had all requested items on hand so gathered and pt had friend bring him to hospital to pick them up.  CSW also continuing to work with pt on housing.  CSW had sent message to [facility] on Wednesday to inquire about possible assistance as he had completed a VISPDAT back in Feb.  Had not received response so reached out to several caseworkers by phone.  Informed updated VISPDAT needed to be completed in order to reassess for services.  VISPDAT completed and sent back in- they will review and plan to meet with pt to discuss potential options.  They also referred CSW to program through [place] that might be able to pay for additional time at hotel- [place] emailed their program manager to discuss assistance.  CSW had referred pt to [facility] program for hotel assistance post hospital DC.  Case worker will speak with manager about this and will call CSW back with determination.  CSW also spoke with pt  about ALF placement- he is agreeable to this and understands this would be long term placement.  CSW spoke with administrator with [facility]- sent referral- not sure they have bed availability.                                   HRT/VAS Care Coordination     Outpatient Care Team Community Paramedicine; Social Worker   Clinical biochemist Name: Marylouise Stacks- [phone]   Social Worker Name: Tammy Sours, [facility], [phone]   Living arrangements for the past 2 months Homeless   Lives with: Self   Patient Current Insurance Coverage Medicaid   Home Assistive Devices/Equipment None   DME [place]; [place]       Social History:  SDOH Screenings   Alcohol Screen: Medium Risk (09/19/2021)   Alcohol Screen    Last Alcohol Screening Score (AUDIT): 16  Depression (PHQ2-9): Not on file  Financial Resource Strain: High Risk (12/28/2021)   Overall Financial Resource Strain (CARDIA)    Difficulty of Paying Living Expenses: Very hard  Food Insecurity: No Food Insecurity (09/19/2021)   Hunger Vital Sign    Worried About Running Out of Food in the Last Year: Never true    Ran Out of Food in the Last Year: Never true  Housing: High Risk (12/28/2021)   Housing    Last Housing Risk Score: 3  Physical Activity: Not on file  Social Connections: Not on file  Stress: Not on file  Tobacco Use: High Risk (12/20/2021)   Patient History    Smoking Tobacco Use: Every Day    Smokeless Tobacco Use: Never    Passive Exposure: Past  Transportation Needs: Unmet Transportation Needs (09/19/2021)   PRAPARE - Hydrologist (Medical): Yes    Lack of Transportation (Non-Medical): Yes    SDOH Interventions: Financial Resources:    Fish farm manager for Disability application assistance- pending with [place]:    Planning to recertify food [place]:   Homeless- living in hotel- spoke with [place], [place], and ALFs  Transportation:   N/a   Follow-up plan:    CSW will continue to assist with follow up on resources for homeless individuals and ALF options as well as how to extend current hotel stay  Jorge Ny, [place] Work[facility] Desk#: [phone] Cell#: [phone]

## 2021-12-29 NOTE — Telephone Encounter (Signed)
Patient asking for a walker and wheel chair order. Spoke with Westley Hummer social worker who contacted Jonnie Finner, RN. Patient was informed that he can have w/c. A wheel chair will be delivered to his home today.

## 2021-12-29 NOTE — Telephone Encounter (Signed)
Patient states in order for him to receive an order for a wheel chair and a walker at the same time, he needs clearance from Dr. Harl Bowie stating he is eligible to have both at the same time. He states Dr. Harl Bowie knows about his falls and assumes it is necessary to have both. He states this clearance would like to go to someone at the hospital, but he is not sure of the name.

## 2022-01-01 ENCOUNTER — Telehealth (HOSPITAL_COMMUNITY): Payer: Self-pay | Admitting: Licensed Clinical Social Worker

## 2022-01-01 DIAGNOSIS — I213 ST elevation (STEMI) myocardial infarction of unspecified site: Secondary | ICD-10-CM | POA: Diagnosis not present

## 2022-01-01 DIAGNOSIS — I5043 Acute on chronic combined systolic (congestive) and diastolic (congestive) heart failure: Secondary | ICD-10-CM | POA: Diagnosis not present

## 2022-01-01 NOTE — Progress Notes (Signed)
Heart and Vascular Care Navigation  01/01/2022  Craig ALESSIO May 14, 1971 [id number]  Reason for Referral: Homelessness Patient is participating in a Managed Medicaid Plan:Yes  Engaged with patient by telephone for follow up visit for Heart and Vascular Care Coordination.                                                                                                   Assessment:     CSW spoke with [facility] worker who confirms they will be able to assist with $500 towards further hospital stay- this should pay for approximately 7 more nights at current hotel.  CSW heard back from [facility] who will plan to reach out to pt to discuss possible case management.                                 HRT/VAS Care Coordination     Outpatient Care Team Community Paramedicine; Social Worker   Clinical biochemist Name: Marylouise Stacks- [phone]   Social Worker Name: Tammy Sours, [facility], [phone]   Living arrangements for the past 2 months Homeless   Lives with: Self   Patient Current Insurance Coverage Medicaid   Home Assistive Devices/Equipment None   DME [place]; [place]       Social History:                                                                             SDOH Screenings   Alcohol Screen: Medium Risk (09/19/2021)   Alcohol Screen    Last Alcohol Screening Score (AUDIT): 16  Depression (PHQ2-9): Not on file  Financial Resource Strain: High Risk (12/28/2021)   Overall Financial Resource Strain (CARDIA)    Difficulty of Paying Living Expenses: Very hard  Food Insecurity: No Food Insecurity (09/19/2021)   Hunger Vital Sign    Worried About Running Out of Food in the Last Year: Never true    Ran Out of Food in the Last Year: Never true  Housing: High Risk (01/01/2022)   Housing    Last Housing Risk Score: 3  Physical Activity: Not on file  Social Connections: Not on file  Stress: Not on file  Tobacco  Use: High Risk (12/20/2021)   Patient History    Smoking Tobacco Use: Every Day    Smokeless Tobacco Use: Never    Passive Exposure: Past  Transportation Needs: Unmet Transportation Needs (09/19/2021)   PRAPARE - Transportation    Lack of Transportation (Medical): Yes    Lack of Transportation (Non-Medical): Yes    Follow-up plan:    CSW will continue to follow and try to help follow up regarding housing options including ALF placement.  Jorge Ny, LCSW Clinical Social Worker [facility] Desk#: [phone] Cell#: [phone]

## 2022-01-02 ENCOUNTER — Ambulatory Visit (HOSPITAL_COMMUNITY): Payer: Medicaid Other

## 2022-01-02 ENCOUNTER — Telehealth (HOSPITAL_COMMUNITY): Payer: Self-pay | Admitting: Licensed Clinical Social Worker

## 2022-01-02 ENCOUNTER — Other Ambulatory Visit (HOSPITAL_COMMUNITY): Payer: Self-pay

## 2022-01-02 NOTE — Progress Notes (Addendum)
Paramedicine Encounter    Patient ID: Craig Schmidt, male    DOB: 1971/03/08, 51 y.o.   MRN: [id number]  First visit with the pt, he is staying in [facility] room for now. He just got approved for 7 more days there.  Eliezer Lofts is working on finding him longer term housing.  He was given the number to contact his medicaid to get set up with transportation.  He has all meds from [place] [facility] presently, he said once he needs refills on those meds he will get them from [facility] on [address]-he said they waive the co-pays for him.  He filled his own pill box and I checked it and it was correct.  He said he didn't have issues remembering taking his meds.  He said if he has to go back to the streets then he will go back to that area and that pharmacy is convenient for him.  His friend is keeping the dog-he said he needs to get him serviced again b/c his paperwork got lost. 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  He needs a scale.  He has rt sided deficit from the stroke. Ambulatory is ok, he walked around a few times in the room and didn't have to use cane/walker or anything.  He said he got dizzy once when trying to get to [employer] a few days ago and that is why he requested the wheelchair.  He reports appetite is ok. Food wise he has very limited options-no income-disability is pending-servant center completed for him. He has not taken meds today yet.  No edema noted. Advised him to just be aware of the sodium intake from the fast foods -I know options are limited-and also advised him to limit fluid intake to 2L or less.   He needs to get new PCP, the one at [facility] only sees uninsured patients. Eliezer Lofts sent message to Opal Sidles at [facility] as well, he is reporting he needs neb machine for his asthma and needs some glasses--which he needs to see eye doc for those.   He may be going to assisted living soon. Will follow until then   BP 130/84   Pulse 96   Resp 16   SpO2 98%   Weight yesterday-? 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   Tear of MCL (medial collateral ligament) of knee 05/20/2015   Protein-calorie malnutrition, severe 05/17/2015   Lactic acidosis 05/15/2015   Left knee pain 05/15/2015   Lung nodules 05/15/2015   Hypoglycemia 05/14/2015   Hypothermia 05/14/2015   Acute encephalopathy 05/14/2015   Cocaine abuse with intoxication ([place]) 05/14/2015   Alcohol intoxication in active alcoholic ([place]) 86/76/7209   Sepsis ([place]) 05/14/2015   Homeless 05/14/2015    Current Outpatient Medications:    apixaban (ELIQUIS) 5 MG TABS tablet, Take 1 tablet (5 mg total)  by mouth 2 (two) times daily., Disp: 60 tablet, Rfl: 5   atorvastatin (LIPITOR) 80 MG tablet, Take 1 tablet (80 mg total) by mouth daily., Disp: 30 tablet, Rfl: 11   clopidogrel (PLAVIX) 75 MG tablet, Take 1 tablet (75 mg total) by mouth daily., Disp: 30 tablet, Rfl: 6   empagliflozin (JARDIANCE) 10 MG TABS tablet, Take 1 tablet (10 mg total) by mouth daily., Disp: 30 tablet, Rfl: 6   famotidine (PEPCID) 20 MG tablet, Take 1 tablet (20 mg total) by mouth daily., Disp: 30 tablet, Rfl: 6   Glycerin-Hypromellose-PEG 400 0.2-0.2-1 % SOLN, Place 1 drop into both eyes as needed., Disp: 15 mL, Rfl: 0   isosorbide-hydrALAZINE (BIDIL) 20-37.5 MG tablet, Take 1 tablet by mouth 3 (three) times daily., Disp: 90  tablet, Rfl: 6   ivabradine (CORLANOR) 5 MG TABS tablet, Take 1 tablet (5 mg total) by mouth 2 (two) times daily with a meal., Disp: 60 tablet, Rfl: 6   digoxin (LANOXIN) 0.125 MG tablet, Take 1 tablet (0.125 mg total) by mouth daily., Disp: 30 tablet, Rfl: 6 Allergies  Allergen Reactions   Peanut-Containing Drug Products Itching   Shellfish Allergy Swelling      Social History   Socioeconomic History   Marital status: Significant Other    Spouse name: Not on file   Number of children: 7   Years of education: Not on file   Highest education level: 9th grade  Occupational History   Occupation: unemployed    Comment: experiencing homelessness-living at daily rent motels.  Tobacco Use   Smoking status: Every Day    Packs/day: 1.00    Years: 41.00    Total pack years: 41.00    Types: Cigarettes    Passive exposure: Past   Smokeless tobacco: Never  Vaping Use   Vaping Use: Never used  Substance and Sexual Activity   Alcohol use: Yes    Alcohol/week: 63.0 standard drinks of alcohol    Types: 63 Cans of beer per week    Comment: 3-40oz beer/day x10 years. 1-2 40oz beer/day in 20s/30s   Drug use: Yes    Types: "Crack" cocaine, Cocaine    Comment: last use 12/18/21   Sexual activity: Not on file  Other Topics Concern   Not on file  Social History Narrative   Not on file   Social Determinants of Health   Financial Resource Strain: High Risk (12/28/2021)   Overall Financial Resource Strain (CARDIA)    Difficulty of Paying Living Expenses: Very hard  Food Insecurity: No Food Insecurity (09/19/2021)   Hunger Vital Sign    Worried About Running Out of Food in the Last Year: Never true    Ran Out of Food in the Last Year: Never true  Transportation Needs: Unmet Transportation Needs (09/19/2021)   PRAPARE - Transportation    Lack of Transportation (Medical): Yes    Lack of Transportation (Non-Medical): Yes  Physical Activity: Not on file  Stress: Not on file  Social  Connections: Not on file  Intimate Partner Violence: Not on file    Physical Exam      Future Appointments  Date Time Provider [place]  01/10/2022 10:30 AM [facility] PA/NP [facility] None  04/18/2022 10:00 AM Janina Mayo, MD [facility] [place], [place] Paramedic  01/02/22

## 2022-01-02 NOTE — Telephone Encounter (Signed)
H&V Care Navigation CSW Progress Note  Clinical Social Worker  spoke with insurance case worker by phone to follow up re hotel extenstion  Patient is participating in a Managed Medicaid Plan:  Yes  SDOH Screenings   Alcohol Screen: Medium Risk (09/19/2021)   Alcohol Screen    Last Alcohol Screening Score (AUDIT): 16  Depression (PHQ2-9): Not on file  Financial Resource Strain: High Risk (12/28/2021)   Overall Financial Resource Strain (CARDIA)    Difficulty of Paying Living Expenses: Very hard  Food Insecurity: No Food Insecurity (09/19/2021)   Hunger Vital Sign    Worried About Running Out of Food in the Last Year: Never true    Ran Out of Food in the Last Year: Never true  Housing: High Risk (01/01/2022)   Housing    Last Housing Risk Score: 3  Physical Activity: Not on file  Social Connections: Not on file  Stress: Not on file  Tobacco Use: High Risk (12/20/2021)   Patient History    Smoking Tobacco Use: Every Day    Smokeless Tobacco Use: Never    Passive Exposure: Past  Transportation Needs: Unmet Transportation Needs (09/19/2021)   PRAPARE - Hydrologist (Medical): Yes    Lack of Transportation (Non-Medical): Yes   [employer] caseworker confirms pt will be getting an additional 7 nights at hotel.  CSW assisting in finding longer term housing options- contacted [facility] who confirms they have a bed available and accept Medicaid.  CSW sent referral to admissions coordinator for review.  Will continue to follow and assist as needed  Jorge Ny, [facility] Desk#: [phone] Cell#: [phone]

## 2022-01-08 ENCOUNTER — Telehealth (HOSPITAL_COMMUNITY): Payer: Self-pay | Admitting: Licensed Clinical Social Worker

## 2022-01-08 NOTE — Telephone Encounter (Signed)
H&V Care Navigation CSW Progress Note  Clinical Social Worker contacted patient by phone to follow up regarding housing resources.  Patient is participating in a Managed Medicaid Plan:  Yes  SDOH Screenings   Alcohol Screen: Medium Risk (09/19/2021)   Alcohol Screen    Last Alcohol Screening Score (AUDIT): 16  Depression (PHQ2-9): Not on file  Financial Resource Strain: High Risk (12/28/2021)   Overall Financial Resource Strain (CARDIA)    Difficulty of Paying Living Expenses: Very hard  Food Insecurity: No Food Insecurity (09/19/2021)   Hunger Vital Sign    Worried About Running Out of Food in the Last Year: Never true    Ran Out of Food in the Last Year: Never true  Housing: High Risk (01/01/2022)   Housing    Last Housing Risk Score: 3  Physical Activity: Not on file  Social Connections: Not on file  Stress: Not on file  Tobacco Use: High Risk (12/20/2021)   Patient History    Smoking Tobacco Use: Every Day    Smokeless Tobacco Use: Never    Passive Exposure: Past  Transportation Needs: Unmet Transportation Needs (09/19/2021)   PRAPARE - Transportation    Lack of Transportation (Medical): Yes    Lack of Transportation (Non-Medical): Yes   CSW continuing to work on finding housing options for patient as tonight is the last night in the hotel.  CSW followed up with [facility] and [facility] regarding referrals- they both have misplaced referrals so CSW resent for review.  CSW also spoke with partners ending homelessness- patient is on the list but they have not reached out yet- nothing imminent they can do at this time.  Will continue to follow and assist as needed  Jorge Ny, [facility] Desk#: [phone] Cell#: [phone]

## 2022-01-09 ENCOUNTER — Telehealth (HOSPITAL_COMMUNITY): Payer: Self-pay

## 2022-01-09 ENCOUNTER — Telehealth: Payer: Self-pay | Admitting: *Deleted

## 2022-01-09 NOTE — Telephone Encounter (Signed)
Called to confirm/remind patient of their appointment at the [facility] on 01/10/22.   Patient reminded to bring all medications and/or complete list.  Confirmed patient has transportation. Gave directions, instructed to utilize [facility] parking.  Confirmed appointment prior to ending call.

## 2022-01-09 NOTE — Telephone Encounter (Signed)
HF TOC CM received call from pt and states today is his last day at the [facility] [address]. States he will be staying with a friend at the [facility] on [address]. Pt has called numerous times requesting assistance. Contacted [facility] and they can have driver pick up ticket at hospital to pick up pt to take to [facility]. Pt has wheelchair and rolling walker, and states does not have anyone that can assist him with transportation. States he is unable to use bus due to belongings. [address], Heart Failure TOC CM [phone]

## 2022-01-10 ENCOUNTER — Ambulatory Visit (HOSPITAL_COMMUNITY)
Admit: 2022-01-10 | Discharge: 2022-01-10 | Disposition: A | Discharge status: Home / Self Care | Payer: Medicaid Other | Source: Ambulatory Visit | Attending: Family Medicine | Admitting: Family Medicine

## 2022-01-10 ENCOUNTER — Encounter (HOSPITAL_COMMUNITY): Payer: Self-pay

## 2022-01-10 ENCOUNTER — Other Ambulatory Visit (HOSPITAL_COMMUNITY): Payer: Self-pay

## 2022-01-10 VITALS — BP 134/80 | HR 79 | Wt 159.8 lb

## 2022-01-10 DIAGNOSIS — Z7901 Long term (current) use of anticoagulants: Secondary | ICD-10-CM | POA: Insufficient documentation

## 2022-01-10 DIAGNOSIS — Z7902 Long term (current) use of antithrombotics/antiplatelets: Secondary | ICD-10-CM | POA: Insufficient documentation

## 2022-01-10 DIAGNOSIS — R008 Other abnormalities of heart beat: Secondary | ICD-10-CM | POA: Diagnosis not present

## 2022-01-10 DIAGNOSIS — I2510 Atherosclerotic heart disease of native coronary artery without angina pectoris: Secondary | ICD-10-CM

## 2022-01-10 DIAGNOSIS — I252 Old myocardial infarction: Secondary | ICD-10-CM | POA: Diagnosis not present

## 2022-01-10 DIAGNOSIS — I493 Ventricular premature depolarization: Secondary | ICD-10-CM | POA: Diagnosis not present

## 2022-01-10 DIAGNOSIS — I050 Rheumatic mitral stenosis: Secondary | ICD-10-CM

## 2022-01-10 DIAGNOSIS — D333 Benign neoplasm of cranial nerves: Secondary | ICD-10-CM

## 2022-01-10 DIAGNOSIS — I5022 Chronic systolic (congestive) heart failure: Secondary | ICD-10-CM | POA: Diagnosis not present

## 2022-01-10 DIAGNOSIS — Z79899 Other long term (current) drug therapy: Secondary | ICD-10-CM | POA: Insufficient documentation

## 2022-01-10 DIAGNOSIS — Z8673 Personal history of transient ischemic attack (TIA), and cerebral infarction without residual deficits: Secondary | ICD-10-CM

## 2022-01-10 DIAGNOSIS — Q232 Congenital mitral stenosis: Secondary | ICD-10-CM | POA: Insufficient documentation

## 2022-01-10 DIAGNOSIS — F1910 Other psychoactive substance abuse, uncomplicated: Secondary | ICD-10-CM

## 2022-01-10 DIAGNOSIS — F1410 Cocaine abuse, uncomplicated: Secondary | ICD-10-CM | POA: Diagnosis not present

## 2022-01-10 DIAGNOSIS — R42 Dizziness and giddiness: Secondary | ICD-10-CM | POA: Diagnosis not present

## 2022-01-10 DIAGNOSIS — Z5900 Homelessness unspecified: Secondary | ICD-10-CM | POA: Diagnosis not present

## 2022-01-10 DIAGNOSIS — I110 Hypertensive heart disease with heart failure: Secondary | ICD-10-CM | POA: Insufficient documentation

## 2022-01-10 DIAGNOSIS — R531 Weakness: Secondary | ICD-10-CM | POA: Diagnosis not present

## 2022-01-10 DIAGNOSIS — Z7984 Long term (current) use of oral hypoglycemic drugs: Secondary | ICD-10-CM | POA: Diagnosis not present

## 2022-01-10 DIAGNOSIS — I498 Other specified cardiac arrhythmias: Secondary | ICD-10-CM

## 2022-01-10 LAB — CBC
HCT: 38.6 % — ABNORMAL LOW (ref 39.0–52.0)
Hemoglobin: 12.4 g/dL — ABNORMAL LOW (ref 13.0–17.0)
MCH: 30.4 pg (ref 26.0–34.0)
MCHC: 32.1 g/dL (ref 30.0–36.0)
MCV: 94.6 fL (ref 80.0–100.0)
Platelets: 327 10*3/uL (ref 150–400)
RBC: 4.08 MIL/uL — ABNORMAL LOW (ref 4.22–5.81)
RDW: 14.6 % (ref 11.5–15.5)
WBC: 9.4 10*3/uL (ref 4.0–10.5)
nRBC: 0 % (ref 0.0–0.2)

## 2022-01-10 LAB — BASIC METABOLIC PANEL
Anion gap: 6 (ref 5–15)
BUN: 15 mg/dL (ref 6–20)
CO2: 26 mmol/L (ref 22–32)
Calcium: 9.2 mg/dL (ref 8.9–10.3)
Chloride: 107 mmol/L (ref 98–111)
Creatinine, Ser: 1.39 mg/dL — ABNORMAL HIGH (ref 0.61–1.24)
GFR, Estimated: >60 mL/min (ref >60)
Glucose, Bld: 116 mg/dL — ABNORMAL HIGH (ref 70–99)
Potassium: 3.6 mmol/L (ref 3.5–5.1)
Sodium: 139 mmol/L (ref 135–145)

## 2022-01-10 NOTE — Progress Notes (Signed)
Paramedicine Encounter   Patient ID: HAI GRABE , male,   DOB: Feb 19, 1971,51 y.o.,  MRN: [id number]  His gf was able to get him a few nights [facility] [address].  The referral to an assisted facility was lost so that was done again and [place] is f/u with that.  Dizziness occasionally-from tumor/stroke.  No more than usual.  No c/p.  Denies drug use since before he was admitted.  Most recent EF was less than 20%-recent MI.   He lost the number to his medicaid txp  His next appoint with neuro got cancelled b/c they weren't able to contact him- Janett Billow is going to send them a note ref that.  He feels like his face is numb from the stroke and he is drooling a lot too.   His next appoint at clinic Janett Billow does not want him to take the digoxin for that morning- so they can get a trough level.   Met patient in clinic today with provider.  Weight @ clinic-159 B/P-134/80 P-79 [id number] Labs today.   Med changes-none today   Marylouise Stacks, [place] 01/10/2022

## 2022-01-10 NOTE — Patient Instructions (Addendum)
It was great to see you today! No medication changes are needed at this time.  Labs today We will only contact you if something comes back abnormal or we need to make some changes. Otherwise no news is good news!  You have been referred to [facility] -they will be in contact with an appointment  Your physician recommends that you schedule a follow-up appointment in: 3 week with the pharmacy tea, in 6 weeks  in the Advanced Practitioners (PA/NP) Clinic, and in 12 weeks with Dr Aundra Dubin and echo  Your physician has requested that you have an echocardiogram. Echocardiography is a painless test that uses sound waves to create images of your heart. It provides your doctor with information about the size and shape of your heart and how well your heart's chambers and valves are working. This procedure takes approximately one hour. There are no restrictions for this procedure.  Do the following things EVERYDAY: Weigh yourself in the morning before breakfast. Write it down and keep it in a log. Take your medicines as prescribed Eat low salt foods--Limit salt (sodium) to 2000 mg per day.  Stay as active as you can everyday Limit all fluids for the day to less than 2 liters  At the [facility], you and your health needs are our priority. As part of our continuing mission to provide you with exceptional heart care, we have created designated Provider Care Teams. These Care Teams include your primary Cardiologist (physician) and Advanced Practice Providers (APPs- Physician Assistants and Nurse Practitioners) who all work together to provide you with the care you need, when you need it.   You may see any of the following providers on your designated Care Team at your next follow up: Dr Glori Bickers Dr Haynes Kerns, NP [name], [place] [facility] [place], [place] Audry Riles, PharmD   Please be sure to bring in all your medications bottles to  every appointment.

## 2022-01-10 NOTE — Progress Notes (Signed)
Heart and Vascular Care Navigation  01/10/2022  Craig Schmidt 02-07-1971 [id number]  Reason for Referral:  Patient is participating in a Managed Medicaid Plan:Yes  Engaged with patient face to face for initial visit for Heart and Vascular Care Coordination.                                                                                                   Assessment:    CSW met with pt during clinic visit to check in.  Pt reports that he was able to go to hotel with girlfriend who got room through local agency since the weather was bad this week.  Room goes through Friday so will have a place to be the next couple of nights- if we don't have another plan at that time he will plan to return to staying outside of local pawn shop as he was before hospital stay.  Pt reports he missed call regarding his disability- he plans to return call today.  Reports he has access to food for now.  Still expresses need for incontinence supplies- CSW able to obtain adult diapers from donations and provide to patient- paramedic working on getting pt diapers through his medicaid benefit moving forward.  CSW spoke with both ALF's today- they are still reviewing referrals- will hopefully have determination shortly if they can accept patient.                               HRT/VAS Care Coordination     Outpatient Care Team [facility]; Social Worker   Clinical biochemist Name: Marylouise Stacks- [phone]   Social Worker Name: Tammy Sours, [facility], [phone]   Living arrangements for the past 2 months Homeless   Lives with: Self   Patient Current Insurance Coverage Medicaid   Home Assistive Devices/Equipment None   DME [place]; [place]       Social History:                                                                             SDOH Screenings   Alcohol Screen: Medium Risk (09/19/2021)   Alcohol Screen    Last Alcohol  Screening Score (AUDIT): 16  Depression (PHQ2-9): Not on file  Financial Resource Strain: High Risk (01/10/2022)   Overall Financial Resource Strain (CARDIA)    Difficulty of Paying Living Expenses: Very hard  Food Insecurity: No Food Insecurity (09/19/2021)   Hunger Vital Sign    Worried About Running Out of Food in the Last Year: Never true    Ran Out of Food in the Last Year: Never true  Housing: High Risk (01/01/2022)   Housing    Last Housing Risk  Score: 3  Physical Activity: Not on file  Social Connections: Not on file  Stress: Not on file  Tobacco Use: High Risk (01/10/2022)   Patient History    Smoking Tobacco Use: Every Day    Smokeless Tobacco Use: Never    Passive Exposure: Past  Transportation Needs: Unmet Transportation Needs (09/19/2021)   PRAPARE - Hydrologist (Medical): Yes    Lack of Transportation (Non-Medical): Yes    SDOH Interventions: Financial Resources:  Financial Strain Interventions: Other (Comment) (pee pads) Social Security for Disability application assistance- reports he plans to call back today  Food Insecurity:   Planning to reapply for food stamps today- if not CSW will help set up with appt next week for [employer], was given a $50 gift card to help with food  Housing Insecurity:   Girlfriend was able to obtain hotel for this week due to weather so has room until Friday he believes  Transportation:   Not assessed   Follow-up plan:    Will continue to follow and assist as needed  Jorge Ny, [facility] Desk#: [phone] Cell#: [phone]

## 2022-01-10 NOTE — Progress Notes (Signed)
ADVANCED HF CLINIC CONSULT NOTE   Primary Care: No primary care provider on file. HF Cardiologist: Dr. Aundra Dubin  HPI: Craig Schmidt is a 51 y.o. 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   Admitted 02/23 with left MCA CVA s/p TNK. Stroke felt to be likely secondary to cardiomyopathy. Echo EF 25-30%,k RV mildly reduced, RVSP 50 mmHg, moderate MS, mild to moderate MR, moderate to severe TR, dilated IVC. TEE 02/23: EF 25-30%, RV severely reduced, MV stenotic (? Parachute mitral valve), MVA 1.45 cm2 consistent with severe MS, moderate MR, no interatrial shunt, no LV or LAA thrombus. Cardiology consulted. CM likely d/t subtance abuse +/- uncontrolled HTN. Had been started on coumadin but later switched to eliquis d/t concern for compliance given his social situation.  Admitted 5/23 with acute inferolateral STEMI. Had used cocaine 24 hrs prior to presentation. Emergent LHC with 100% apical LAD suspected embolic vs plaque rupture with thrombosis. LVEDP 40. Echo this admit showed EF < 20%, ? RV okay. He was significantly volume overloaded and hypertensive. Started on lasix gtt and nitro gtt. Had good diuresis but Scr began to trend up, 1.5>1.77>1.87>2.0.  Developed soft BP and BP-active meds held. There was concern for low-output. PICC placed and Co-ox within normal range. Drips weaned and GDMT started. He was discharged home, weight 156 lbs.   Today he returns for post hospital HF follow up, here with [name], paramedic. He is staying in a hotel. Overall feeling fine. He is SOB walking longer distances on flat ground. Limited by R hemiparesis, uses a walker. Denies palpitations, abnormal bleeding, CP, dizziness, edema, or PND/Orthopnea. Appetite ok. No fever or chills. Taking all medications. Smokes 1-2 cigarettes/day, last used cocaine a couple weeks ago.  ECG  (personally reviewed): NSR with PVC, 76 bpm  Labs (6/23): K 3.8, creatinine 1.75    Review of Systems: [y] = yes, '[ ]'$  = no    General: Weight gain '[ ]'$ ; Weight loss '[ ]'$ ; Anorexia '[ ]'$ ; Fatigue '[ ]'$ ; Fever '[ ]'$ ; Chills '[ ]'$ ; Weakness '[ ]'$   Cardiac: Chest pain/pressure '[ ]'$ ; Resting SOB '[ ]'$ ; Exertional SOB [Y]; Orthopnea '[ ]'$ ; Pedal Edema '[ ]'$ ; Palpitations '[ ]'$ ; Syncope '[ ]'$ ; Presyncope '[ ]'$ ; Paroxysmal nocturnal dyspnea'[ ]'$   Pulmonary: Cough '[ ]'$ ; Wheezing'[ ]'$ ; Hemoptysis'[ ]'$ ; Sputum '[ ]'$ ; Snoring '[ ]'$   GI: Vomiting'[ ]'$ ; Dysphagia'[ ]'$ ; Melena'[ ]'$ ; Hematochezia '[ ]'$ ; Heartburn'[ ]'$ ; Abdominal pain '[ ]'$ ; Constipation '[ ]'$ ; Diarrhea '[ ]'$ ; BRBPR '[ ]'$   GU: Hematuria'[ ]'$ ; Dysuria '[ ]'$ ; Nocturia'[ ]'$   Vascular: Pain in legs with walking '[ ]'$ ; Pain in feet with lying flat '[ ]'$ ; Non-healing sores '[ ]'$ ; Stroke [Y]; TIA '[ ]'$ ; Slurred speech '[ ]'$ ;  Neuro: Headaches'[ ]'$ ; Vertigo'[ ]'$ ; Seizures'[ ]'$ ; Paresthesias'[ ]'$ ;Blurred vision '[ ]'$ ; Diplopia '[ ]'$ ; Vision changes '[ ]'$   Ortho/Skin: Arthritis '[ ]'$ ; Joint pain '[ ]'$ ; Muscle pain '[ ]'$ ; Joint swelling '[ ]'$ ; Back Pain '[ ]'$ ; Rash '[ ]'$   Psych: Depression'[ ]'$ ; Anxiety'[ ]'$   Heme: Bleeding problems '[ ]'$ ; Clotting disorders '[ ]'$ ; Anemia '[ ]'$   Endocrine: Diabetes '[ ]'$ ; Thyroid dysfunction'[ ]'$   Past Medical History:  Diagnosis Date   Asthma    Brain tumor ([place])    Current Outpatient Medications  Medication Sig Dispense Refill   apixaban (ELIQUIS) 5 MG TABS tablet Take 1 tablet (5 mg total) by mouth  2 (two) times daily. 60 tablet 5   atorvastatin (LIPITOR) 80 MG tablet Take 1 tablet (80 mg total) by mouth daily. 30 tablet 11   clopidogrel (PLAVIX) 75 MG tablet Take 1 tablet (75 mg total) by mouth daily. 30 tablet 6   digoxin (LANOXIN) 0.125 MG tablet Take 1 tablet (0.125 mg total) by mouth daily. 30 tablet 6   empagliflozin (JARDIANCE) 10 MG TABS tablet Take 1 tablet (10 mg total) by mouth daily. 30 tablet 6   famotidine (PEPCID) 20 MG tablet Take 1 tablet (20 mg total) by mouth daily. 30 tablet 6    Glycerin-Hypromellose-PEG 400 0.2-0.2-1 % SOLN Place 1 drop into both eyes as needed. 15 mL 0   isosorbide-hydrALAZINE (BIDIL) 20-37.5 MG tablet Take 1 tablet by mouth 3 (three) times daily. 90 tablet 6   ivabradine (CORLANOR) 5 MG TABS tablet Take 1 tablet (5 mg total) by mouth 2 (two) times daily with a meal. 60 tablet 6   No current facility-administered medications for this encounter.   Allergies  Allergen Reactions   Peanut-Containing Drug Products Itching   Shellfish Allergy Swelling   Social History   Socioeconomic History   Marital status: Significant Other    Spouse name: Not on file   Number of children: 7   Years of education: Not on file   Highest education level: 9th grade  Occupational History   Occupation: unemployed    Comment: experiencing homelessness-living at daily rent motels.  Tobacco Use   Smoking status: Every Day    Packs/day: 1.00    Years: 41.00    Total pack years: 41.00    Types: Cigarettes    Passive exposure: Past   Smokeless tobacco: Never  Vaping Use   Vaping Use: Never used  Substance and Sexual Activity   Alcohol use: Yes    Alcohol/week: 63.0 standard drinks of alcohol    Types: 63 Cans of beer per week    Comment: 3-40oz beer/day x10 years. 1-2 40oz beer/day in 20s/30s   Drug use: Yes    Types: "Crack" cocaine, Cocaine    Comment: last use 12/18/21   Sexual activity: Not on file  Other Topics Concern   Not on file  Social History Narrative   Not on file   Social Determinants of Health   Financial Resource Strain: High Risk (12/28/2021)   Overall Financial Resource Strain (CARDIA)    Difficulty of Paying Living Expenses: Very hard  Food Insecurity: No Food Insecurity (09/19/2021)   Hunger Vital Sign    Worried About Running Out of Food in the Last Year: Never true    Ran Out of Food in the Last Year: Never true  Transportation Needs: Unmet Transportation Needs (09/19/2021)   PRAPARE - Hydrologist  (Medical): Yes    Lack of Transportation (Non-Medical): Yes  Physical Activity: Not on file  Stress: Not on file  Social Connections: Not on file  Intimate Partner Violence: Not on file   History reviewed. No pertinent family history.  BP 134/80   Pulse 79   Wt 72.5 kg (159 lb 12.8 oz)   SpO2 100%   BMI 25.79 kg/m   Wt Readings from Last 3 Encounters:  01/10/22 72.5 kg (159 lb 12.8 oz)  12/27/21 71 kg (156 lb 8.4 oz)  10/16/21 76.6 kg (168 lb 12.8 oz)   PHYSICAL EXAM: General:  NAD. No resp difficulty HEENT: Normal Neck: Supple. No JVD. Carotids 2+ bilat; no bruits.  No lymphadenopathy or thryomegaly appreciated. Cor: PMI nondisplaced. Regular rate & rhythm. No rubs, gallops or murmurs. Lungs: Clear Abdomen: Soft, nontender, nondistended. No hepatosplenomegaly. No bruits or masses. Good bowel sounds. Extremities: No cyanosis, clubbing, rash, edema Neuro: Alert & oriented x 3, cranial nerves grossly intact. R-sided hemiparesis. Affect pleasant.  ASSESSMENT & PLAN: 1. CAD: Admit 6/23 with inferolateral STEMI by ECG, cath showed occluded apical LAD.  Cannot rule out plaque rupture in setting of cocaine abuse, but given history of suspected cardioembolic CVA, wonder if this was not a cardioembolic MI.  No chest pain.  - Continue atorvastatin  - Continue Plavix (post-MI) + Eliquis.  Think he should remain anticoagulated (was supposed to be on prior to admission but not sure if he was taking) given suspected cardioembolism.  2. Chronic systolic CHF: Patient was found to have cardiomyopathy in 2/23 at time of admission for CVA.  Echo showed EF 25-30% at that time.  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  Echo this admission showed LV EF < 20%, RV mildly decreased function, abnormal mitral valve looks rheumatic => suspect moderate mitral stenosis at least with planimetered MVA 1.6 cm^2 and calculated MVA by VTI 0.8 cm^2; mean  gradient only 6 mmHg but likely function of low cardiac output. The IVC was small/non-dilated. Suspect he was not taking his medications correctly at home.  NYHA II symptoms, functional status difficult given CVA and deconditioning. - Consider starting losartan, pending renal function. BMET today. - Continue digoxin 0.125. Dig level 0.4 12/27/21. Check dig trough at next visit as he took med this morning. - Continue Bidil 1 tab tid. - Continue Corlanor 5 mg bid. HR 79 today. - Continue Jardiance 10 mg daily.   - Not a candidate for advanced therapies or home inotropes with active substance abuse (used cocaine just prior to admission).  3. CVA: 2/23, has residual right-sided weakness.  Thought to be cardioembolic at the time, started on anticoagulation.   - Use Eliquis given concerns with compliance with warfarin INR checks. No abnormal bleeding, CBC today. 4. Substance abuse: Urged him to quit cocaine.  5. 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   Dr. Aundra Dubin reviewed this admission's echo, it shows moderate mitral stenosis at least with planimetered MVA 1.6 cm^2 and calculated MVA by VTI 0.8 cm^2; mean gradient only 6 mmHg but possibly function of low cardiac output.  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  Unusual given age. Hemodynamic LHC/RHC eventually would be helpful for full evaluation of mitral stenosis but he refused and not currently a good surgical candidate with severe biventricular failure (though will need to review prior TEE for valvuloplasty candidacy).  - Not currently candidate for valve replacement with substance abuse and markedly low LV function.   6. Homeless: Staying in [place]. HFSW helping with resources. 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  - Continue to work with PT, use walker.  8. Ventricular Trigeminy: BMET today.   Follow up in 3 weeks with PharmD (add low losartan if renal function stable, check dig  trough), 6 weeks with APP, and 12 weeks with Dr. Aundra Dubin + echo.  Allena Katz, [name]-BC 01/10/22

## 2022-01-12 ENCOUNTER — Other Ambulatory Visit: Payer: Self-pay

## 2022-01-12 ENCOUNTER — Telehealth (HOSPITAL_COMMUNITY): Payer: Self-pay

## 2022-01-12 ENCOUNTER — Telehealth (HOSPITAL_COMMUNITY): Payer: Self-pay | Admitting: Licensed Clinical Social Worker

## 2022-01-12 NOTE — Telephone Encounter (Signed)
Transitions of Care Pharmacy  ° °Call attempted for a pharmacy transitions of care follow-up. HIPAA appropriate voicemail was left with call back information provided.  ° °Call attempt #1. Will follow-up in 2-3 days.  °  °

## 2022-01-15 ENCOUNTER — Encounter (HOSPITAL_COMMUNITY): Payer: Self-pay

## 2022-01-15 ENCOUNTER — Telehealth (HOSPITAL_COMMUNITY): Payer: Self-pay | Admitting: Licensed Clinical Social Worker

## 2022-01-15 ENCOUNTER — Emergency Department (HOSPITAL_COMMUNITY): Payer: Medicaid Other

## 2022-01-15 ENCOUNTER — Other Ambulatory Visit: Payer: Self-pay

## 2022-01-15 ENCOUNTER — Telehealth (HOSPITAL_COMMUNITY): Payer: Self-pay

## 2022-01-15 ENCOUNTER — Emergency Department (HOSPITAL_COMMUNITY)
Admission: EM | Admit: 2022-01-15 | Discharge: 2022-01-15 | Disposition: A | Discharge status: Home / Self Care | Payer: Medicaid Other | Attending: Emergency Medicine | Admitting: Emergency Medicine

## 2022-01-15 DIAGNOSIS — Z7982 Long term (current) use of aspirin: Secondary | ICD-10-CM | POA: Diagnosis not present

## 2022-01-15 DIAGNOSIS — R001 Bradycardia, unspecified: Secondary | ICD-10-CM | POA: Diagnosis not present

## 2022-01-15 DIAGNOSIS — Z7901 Long term (current) use of anticoagulants: Secondary | ICD-10-CM | POA: Insufficient documentation

## 2022-01-15 DIAGNOSIS — R0789 Other chest pain: Secondary | ICD-10-CM | POA: Diagnosis not present

## 2022-01-15 DIAGNOSIS — R52 Pain, unspecified: Secondary | ICD-10-CM | POA: Diagnosis not present

## 2022-01-15 DIAGNOSIS — J45909 Unspecified asthma, uncomplicated: Secondary | ICD-10-CM | POA: Insufficient documentation

## 2022-01-15 DIAGNOSIS — R0602 Shortness of breath: Secondary | ICD-10-CM | POA: Diagnosis not present

## 2022-01-15 DIAGNOSIS — R079 Chest pain, unspecified: Secondary | ICD-10-CM | POA: Diagnosis not present

## 2022-01-15 DIAGNOSIS — Z91010 Allergy to peanuts: Secondary | ICD-10-CM | POA: Insufficient documentation

## 2022-01-15 DIAGNOSIS — I509 Heart failure, unspecified: Secondary | ICD-10-CM | POA: Diagnosis not present

## 2022-01-15 DIAGNOSIS — I491 Atrial premature depolarization: Secondary | ICD-10-CM | POA: Diagnosis not present

## 2022-01-15 DIAGNOSIS — R072 Precordial pain: Secondary | ICD-10-CM | POA: Diagnosis not present

## 2022-01-15 HISTORY — DX: Cerebral infarction, unspecified: I63.9

## 2022-01-15 HISTORY — DX: Acute myocardial infarction, unspecified: I21.9

## 2022-01-15 LAB — CBC WITH DIFFERENTIAL/PLATELET
Abs Immature Granulocytes: 0.02 10*3/uL (ref 0.00–0.07)
Basophils Absolute: 0 10*3/uL (ref 0.0–0.1)
Basophils Relative: 0 %
Eosinophils Absolute: 0.2 10*3/uL (ref 0.0–0.5)
Eosinophils Relative: 3 %
HCT: 33.9 % — ABNORMAL LOW (ref 39.0–52.0)
Hemoglobin: 11.3 g/dL — ABNORMAL LOW (ref 13.0–17.0)
Immature Granulocytes: 0 %
Lymphocytes Relative: 18 %
Lymphs Abs: 1.4 10*3/uL (ref 0.7–4.0)
MCH: 31 pg (ref 26.0–34.0)
MCHC: 33.3 g/dL (ref 30.0–36.0)
MCV: 92.9 fL (ref 80.0–100.0)
Monocytes Absolute: 0.6 10*3/uL (ref 0.1–1.0)
Monocytes Relative: 8 %
Neutro Abs: 5.4 10*3/uL (ref 1.7–7.7)
Neutrophils Relative %: 71 %
Platelets: 248 10*3/uL (ref 150–400)
RBC: 3.65 MIL/uL — ABNORMAL LOW (ref 4.22–5.81)
RDW: 14.6 % (ref 11.5–15.5)
WBC: 7.6 10*3/uL (ref 4.0–10.5)
nRBC: 0 % (ref 0.0–0.2)

## 2022-01-15 LAB — COMPREHENSIVE METABOLIC PANEL
ALT: 16 U/L (ref 0–44)
AST: 41 U/L (ref 15–41)
Albumin: 3.3 g/dL — ABNORMAL LOW (ref 3.5–5.0)
Alkaline Phosphatase: 77 U/L (ref 38–126)
Anion gap: 10 (ref 5–15)
BUN: 22 mg/dL — ABNORMAL HIGH (ref 6–20)
CO2: 22 mmol/L (ref 22–32)
Calcium: 8.8 mg/dL — ABNORMAL LOW (ref 8.9–10.3)
Chloride: 107 mmol/L (ref 98–111)
Creatinine, Ser: 1.63 mg/dL — ABNORMAL HIGH (ref 0.61–1.24)
GFR, Estimated: 51 mL/min — ABNORMAL LOW (ref >60)
Glucose, Bld: 98 mg/dL (ref 70–99)
Potassium: 4.1 mmol/L (ref 3.5–5.1)
Sodium: 139 mmol/L (ref 135–145)
Total Bilirubin: 0.7 mg/dL (ref 0.3–1.2)
Total Protein: 6.2 g/dL — ABNORMAL LOW (ref 6.5–8.1)

## 2022-01-15 LAB — TROPONIN I (HIGH SENSITIVITY): Troponin I (High Sensitivity): 33 ng/L — ABNORMAL HIGH (ref <18)

## 2022-01-15 LAB — BRAIN NATRIURETIC PEPTIDE: B Natriuretic Peptide: 375.4 pg/mL — ABNORMAL HIGH (ref 0.0–100.0)

## 2022-01-16 NOTE — Progress Notes (Addendum)
Subjective:    Craig Schmidt - 51 y.o. male MRN [id number]  Date of birth: 1971/02/10  HPI  Craig Schmidt is to establish care.   Current issues and/or concerns: - Established with Cardiology. CHF, hx CVA, CAD, rheumatic mitral stenosis, ventricular trigeminy, vestibular schwannoma, substance abuse.  - Stroke March 2023. Right-sided residual weakness. Referred to Neurology at hospital discharge. Has not officially established with them as of present. Provided contact information today in office to [facility].  Needs referral to physical therapy. - Since stroke noticed change in vision bilaterally. Right > left. - Requests referral to dentist.  - Urinary incontinence during nighttime. Requesting pads to use during sleeping hours. - History of asthma. Needs referral to Pulmonology. Smoking since 51 years old. Trying to quit.  - Eczema mostly at bilateral arms and back. - Reports homeless for over 20 years. Using his stepmother's address for mailing purposes. Was living in a tent. Reports [place] ordinance has now banned tents. Tried to get housing assistance from [employer] without success. [employer] and monetary donations from the community. Disability pending.   ROS per HPI     Health Maintenance:  Health Maintenance Due  Topic Date Due   COVID-19 Vaccine (1) Never done   Hepatitis C Screening  Never done   COLONOSCOPY (Pts 45-56yr Insurance coverage will need to be confirmed)  Never done     Past Medical History: Patient Active Problem List   Diagnosis Date Noted   Chronic combined systolic and diastolic heart failure (HCC)    ST elevation myocardial infarction (STEMI) of inferolateral wall, subsequent episode of care ([facility] 12/19/2021   [place] elevation [place] ([place]I) ([place]    Ischemic cardiomyopathy    Cocaine abuse ([facility]    Smoker    Alcohol abuse    Acute ischemic stroke ([facility] 09/08/2021    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[facility] 05/20/2015   Tear of MCL (medial collateral ligament) of knee 05/20/2015   Protein-calorie malnutrition, severe 05/17/2015   Lactic acidosis 05/15/2015   Left knee pain 05/15/2015   Lung nodules 05/15/2015   Hypoglycemia 05/14/2015   Hypothermia 05/14/2015   Acute encephalopathy 05/14/2015   Cocaine abuse with intoxication ([facility] 05/14/2015   Alcohol intoxication in active alcoholic ([facility] 112/87/8676  Sepsis ([facility] 05/14/2015   Homeless 05/14/2015      Social History   reports that he has been smoking cigarettes. He has a 41.00 pack-year smoking history. He has been exposed to tobacco smoke. He has never used smokeless tobacco. He reports current alcohol use of about 63.0 standard drinks of alcohol per week. He reports current drug use. Drugs: "Crack" cocaine and Cocaine.   Family History  family history is not on file.   Medications: reviewed and updated   Objective:   Physical Exam BP 138/74 (BP Location: Left Arm, Patient Position: Sitting, Cuff Size: Normal)   Pulse 80   Temp (!) 97 F (36.1 C)   Resp 16   Ht 5' 6.5" (1.689 m)   Wt 165 lb (74.8 kg)   SpO2 98%   BMI 26.24 kg/m   Physical Exam HENT:     Head: Normocephalic and atraumatic.  Eyes:     Extraocular Movements: Extraocular movements intact.     Conjunctiva/sclera: Conjunctivae normal.     Pupils: Pupils are equal, round, and reactive to light.  Cardiovascular:     Rate and Rhythm: Normal rate and regular rhythm.     Pulses: Normal  pulses.     Heart sounds: Normal heart sounds.  Pulmonary:     Effort: Pulmonary effort is normal.     Breath sounds: Normal breath sounds.  Musculoskeletal:     Cervical back: Normal range of motion and neck supple.  Neurological:     General: No focal deficit present.     Mental Status: He is alert and oriented to person, place, and time.  Psychiatric:        Mood and Affect: Mood normal.        Behavior: Behavior normal.         Assessment & Plan:  1. Encounter to establish care - Counseled on 150 minutes of exercise per week as tolerated, healthy eating (including decreased daily intake of saturated fats, cholesterol, added sugars, sodium), STI prevention, and routine healthcare maintenance.  2. Chronic combined systolic and diastolic heart failure ([place]) 3. History of CVA (cerebrovascular accident) 4. CAD in native artery 5. Rheumatic mitral stenosis 6. Ventricular trigeminy 7. 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[place]) - Keep all scheduled appointments with [facility].  - Patient provided with contact information to [facility] (CAD and vestibular schwannoma). - Referral to Physical Therapy for further evaluation and management.  - Ambulatory referral to Physical Therapy  8. Routine eye exam - Referral to Ophthalmology for further evaluation and management. - Ambulatory referral to Ophthalmology  9. Encounter for routine dental examination - Referral to Dentistry for further evaluation and management. - Ambulatory referral to Dentistry  10. Urinary incontinence, unspecified type - Provided with sanitary bed padding today in office.  - Patient needs adult large size brief. - Referral to Urology for further evaluation and management. - Ambulatory referral to Urology  11. History of asthma 12. Smoker - Referral to Pulmonology for further evaluation and management.  - Ambulatory referral to Pulmonology  13. Eczema, unspecified type - Triamcinolone ointment as prescribed. Counseled on medication adherence and adverse effects. - Follow-up with primary provider as scheduled.  - triamcinolone ointment (KENALOG) 0.5 %; Apply 1 Application topically 2 (two) times daily.  Dispense: 60 g; Refill: 2  14. Homeless - I sent message to RN case manager Eden Lathe to see if we have any housing resources.        Patient was given clear  instructions to go to Emergency Department or return to medical center if symptoms don't improve, worsen, or new problems develop.The patient verbalized understanding.  I discussed the assessment and treatment plan with the patient. The patient was provided an opportunity to ask questions and all were answered. The patient agreed with the plan and demonstrated an understanding of the instructions.   The patient was advised to call back or seek an in-person evaluation if the symptoms worsen or if the condition fails to improve as anticipated.    [name], NP 01/24/2022, 3:56 PM Primary Care at [facility]

## 2022-01-17 ENCOUNTER — Telehealth (HOSPITAL_COMMUNITY): Payer: Self-pay | Admitting: Licensed Clinical Social Worker

## 2022-01-17 NOTE — Telephone Encounter (Signed)
CSW received call from pt inquiring how he can get help trouble shooting problems with his wheelchair- CSW spoke with [employer] rep and they will have someone follow up with pt regarding concerns.  CSW spoke with ALF admissions coordinator for [facility]- she reports they will have to deny patient due to recent substance abuse.  CSW will continue to look into alternative options for patient and assist as able  Craig Schmidt, [facility] Desk#: [phone] Cell#: [phone]

## 2022-01-18 ENCOUNTER — Inpatient Hospital Stay: Payer: Medicaid Other | Admitting: Neurology

## 2022-01-18 NOTE — Progress Notes (Signed)
Advanced Heart Failure Clinic Note   Primary Care: No primary care provider on file. HF Cardiologist: Dr. Aundra Dubin  HPI:  Craig Schmidt is a 51 y.o. male with history of homelessness, cocaine abuse/ETOH abuse, hx L MCA CVA in 08/2021 s/p TNKAse, left vestibular schwannoma, recently diagnosed systolic CHF and mitral valve stenosis.   Admitted 08/2021 with left MCA CVA s/p TNK. Stroke felt to be likely secondary to cardiomyopathy. Echo EF 25-30%, RV mildly reduced, RVSP 50 mmHg, moderate MS, mild to moderate MR, moderate to severe TR, dilated IVC. TEE 08/2021: EF 25-30%, RV severely reduced, MV stenotic (? Parachute mitral valve), MVA 1.45 cm2 consistent with severe MS, moderate MR, no interatrial shunt, no LV or LAA thrombus. Cardiology consulted. CM likely d/t subtance abuse +/- uncontrolled HTN. Had been started on coumadin but later switched to Eliquis d/t concern for compliance given his social situation.   Admitted 11/2021 with acute inferolateral STEMI. Had used cocaine 24 hrs prior to presentation. Emergent LHC with 100% apical LAD suspected embolic vs plaque rupture with thrombosis. LVEDP 40. Echo this admit showed EF < 20%, ? RV okay. He was significantly volume overloaded and hypertensive. Started on Lasix gtt and nitro gtt. Had good diuresis but Scr began to trend up, 1.5>1.77>1.87>2.0.  Developed soft BP and BP-active meds held. There was concern for low-output. PICC placed and Co-ox within normal range. Drips weaned and GDMT started. He was discharged home, weight 156 lbs.   Returned to [facility] for post hospital HF follow up, came with Craig Schmidt, paramedic. He was staying in a hotel. Overall was feeling fine. He reported SOB walking longer distances on flat ground. Limited by R hemiparesis, uses a walker. Denied palpitations, abnormal bleeding, CP, dizziness, edema, or PND/Orthopnea. Appetite was ok. No fever or chills. Reported taking all medications. Smokes 1-2 cigarettes/day, last used  cocaine a couple weeks prior to clinic visit.  Today he returns to HF clinic for pharmacist medication titration. At last visit with APP, no medication changes were made. Recent ED visit 01/15/22 for exertional chest pain. No acute distress and [address], patient was discharged with no intervention after discussion with cardiology. Overall he is feeling ok today. Does note occasional chest tightness which he attributes to being outside in the heat. Patient is still homeless. No dizziness or lightheadedness. No palpitations. Notes some fatigue which is stable. No SOB/DOE. He does not take a loop diuretic. Weight is up ~ 5 lbs since last clinic visit. 1+ LEE in R leg. No PND/orthopnea. Attributes extra fluid to eating more processed foods and having to consume more fluids in the heat. Of note, patient self-restarted carvedilol 3.125 mg BID. He did not realize it had been discontinued. Followed by HF paramedicine, appreciate their assistance managing his medications. He was provided scale today.     HF Medications: Carvedilol 3.125 mg BID Jardiance 10 mg daily Bidil 20-37.5 mg 1 tablet TID Digoxin 0.125 mg daily Corlanor 5 mg BID  Has the patient been experiencing any side effects to the medications prescribed?  no  Does the patient have any problems obtaining medications due to transportation or finances? No, has Craig Schmidt Medicaid. Obtains from [employer] on [address] as they waive copays for Medicaid patients.   Understanding of regimen: fair Understanding of indications: fair Potential of compliance: good Patient understands to avoid NSAIDs. Patient understands to avoid decongestants.    Pertinent Lab Values: 01/31/22: Serum creatinine 1.56, BUN 18, Potassium 4.1, Sodium 141, BNP 459.2  Vital Signs: Weight:  165.4 lbs (last clinic weight: 159.8 lbs) Blood pressure: 148/74  Heart rate: 71   Assessment/Plan: 1. CAD: Admit 12/2021 with inferolateral STEMI by ECG, cath showed occluded apical  LAD.  Cannot rule out plaque rupture in setting of cocaine abuse, but given history of suspected cardioembolic CVA, wonder if this was not a cardioembolic MI.  No chest pain.  - Continue atorvastatin  - Continue Plavix (post-MI) + Eliquis.  Think he should remain anticoagulated (was supposed to be on prior to admission but not sure if he was taking) given suspected cardioembolism.  2. Chronic systolic CHF: Patient was found to have cardiomyopathy in 08/2021 at time of admission for CVA.  Echo showed EF 25-30% at that time.  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  Echo this admission showed LV EF < 20%, RV mildly decreased function, abnormal mitral valve looks rheumatic => suspect moderate mitral stenosis at least with planimetered MVA 1.6 cm^2 and calculated MVA by VTI 0.8 cm^2; mean gradient only 6 mmHg but likely function of low cardiac output. The IVC was small/non-dilated. Suspect he was not taking his medications correctly at home.   - NYHA II symptoms, functional status difficult given CVA and deconditioning. Volume status mildly elevated on exam. - Continue carvedilol 3.125 mg BID. Monitor carefully with recent admission with low-output heart failure. Patient restarted himself by mistake. Will continue for now.  - Start Entresto 24/26 mg BID. Repeat BMET in 3 weeks.  - Continue Jardiance 10 mg daily.  - Continue Bidil 1 tab TID. - Continue digoxin 0.125 mg daily. Dig level 0.4 12/27/21. Check digoxin trough at next visit as he took med this morning.  - Continue Corlanor 5 mg BID.   - Not a candidate for advanced therapies or home inotropes with active substance abuse (used cocaine just prior to admission).  3. CVA: 08/2021, has residual right-sided weakness.  Thought to be cardioembolic at the time, started on anticoagulation.   - Use Eliquis given concerns with compliance with warfarin INR checks. No abnormal bleeding,  4. Substance  abuse: Urged him to quit cocaine.  5. 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   Dr. Aundra Dubin reviewed this admission's echo, it shows moderate mitral stenosis at least with planimetered MVA 1.6 cm^2 and calculated MVA by VTI 0.8 cm^2; mean gradient only 6 mmHg but possibly function of low cardiac output.  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  Unusual given age. Hemodynamic LHC/RHC eventually would be helpful for full evaluation of mitral stenosis but he refused and not currently a good surgical candidate with severe biventricular failure (though will need to review prior TEE for valvuloplasty candidacy).  - Not currently candidate for valve replacement with substance abuse and markedly low LV function.   6. Homeless: No longer in motel. HFSW helping with resources.  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  - Continue to work with PT, use walker.  8. Ventricular Trigeminy  Follow up 3 weeks with APP Clinic.   Audry Riles, PharmD, BCPS, BCCP, CPP Heart Failure Clinic Pharmacist [phone]

## 2022-01-19 ENCOUNTER — Telehealth (HOSPITAL_COMMUNITY): Payer: Self-pay | Admitting: Licensed Clinical Social Worker

## 2022-01-19 NOTE — Telephone Encounter (Signed)
H&V Care Navigation CSW Progress Note  Clinical Social Worker  received call from pt  to discuss housing and med concerns.  Patient is participating in a Managed Medicaid Plan:  Yes  SDOH Screenings   Alcohol Screen: Medium Risk (09/19/2021)   Alcohol Screen    Last Alcohol Screening Score (AUDIT): 16  Depression (PHQ2-9): Not on file  Financial Resource Strain: High Risk (01/10/2022)   Overall Financial Resource Strain (CARDIA)    Difficulty of Paying Living Expenses: Very hard  Food Insecurity: No Food Insecurity (09/19/2021)   Hunger Vital Sign    Worried About Running Out of Food in the Last Year: Never true    Ran Out of Food in the Last Year: Never true  Housing: High Risk (01/01/2022)   Housing    Last Housing Risk Score: 3  Physical Activity: Not on file  Social Connections: Not on file  Stress: Not on file  Tobacco Use: High Risk (01/15/2022)   Patient History    Smoking Tobacco Use: Every Day    Smokeless Tobacco Use: Never    Passive Exposure: Past  Transportation Needs: Unmet Transportation Needs (09/19/2021)   PRAPARE - Transportation    Lack of Transportation (Medical): Yes    Lack of Transportation (Non-Medical): Yes    Pt called CSW to follow up re housing options.  CSW explained that he remains on waitlist with [facility] but is not eligible for case management services yet.    Informed pt that pt does not have payor source for ALF as he hasn't started getting disability income and that substance use would be a barrier to placement as well.  CSW discussed shelter placement but pt not interested as he has his dog.  Unfortunately no options identified at this time to help pt with shelter so he continues to remain unsheltered.  Pt expressed understanding of above.  Pt then mentioned concerns with increased skin itching since starting medications back- unsure when exactly this started but reports he just started to notice it.  CSW discussed with pharmacist but no clear  medication on his list that would cause this.  Clinic Pharm MD thinks safest medication to trial off of would be Jardiance if patient really feels as if this is coorelated to his medications.  CSW explained this to patient he will see if skin improves (as he also has eczema).  Pt will not make any med adjustments at this time and will wait to see paramedic next week.  CSW sent message to paramedic regarding above concerns.  Will continue to follow and assist with options as able  Jorge Ny, [place] [facility] Desk#: [phone] Cell#: [phone]

## 2022-01-23 ENCOUNTER — Other Ambulatory Visit (HOSPITAL_COMMUNITY): Payer: Self-pay

## 2022-01-23 NOTE — Progress Notes (Signed)
Paramedicine Encounter    Patient ID: Craig Schmidt, male    DOB: 1970/10/07, 51 y.o.   MRN: [id number]  I met pt behind a business for visit.  He reports itching all over, he has eczema  already but he said this is more of a itch than usual. He has PCP appointment tomor and is going to keep taking jardiance despite when he called in to clinic last week reporting the same but he doesn't want to stop taking it if he needs it,  He said he will discuss with PCP tomor.  He will need a ride to there-also will get him some bus passes too.  He is still staying on the streets. He is not eligible for ALF due to no income and drug use.  He seems to be resourceful with where he is staying at.  [facility] on [address]. I will get those moved over for him.  He said they will waive his medicaid co-pays.  He reports feeling fine, he does get hot when moving around too much-which doesn't take much b/c its over 90 degrees this week feels like 100.   BP (!) 142/82   Pulse 93   Resp 16   SpO2 99%    Patient Care Team: Pcp, No as PCP - [facility], Royetta Crochet, MD as PCP - Cardiology (Cardiology) Barbaraann Faster, RN as [place] Management  Patient Active Problem List   Diagnosis Date Noted   Chronic combined systolic and diastolic heart failure (HCC)    ST elevation myocardial infarction (STEMI) of inferolateral wall, subsequent episode of care ([place]) 12/19/2021   ST elevation myocardial infarction (STEMI) (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   Tear of MCL (medial collateral ligament) of knee 05/20/2015   Protein-calorie malnutrition, severe 05/17/2015   Lactic acidosis 05/15/2015   Left knee pain 05/15/2015   Lung nodules 05/15/2015   Hypoglycemia 05/14/2015   Hypothermia 05/14/2015   Acute  encephalopathy 05/14/2015   Cocaine abuse with intoxication ([place]) 05/14/2015   Alcohol intoxication in active alcoholic ([place]) 17/35/6701   Sepsis ([place]) 05/14/2015   Homeless 05/14/2015    Current Outpatient Medications:    apixaban (ELIQUIS) 5 MG TABS tablet, Take 1 tablet (5 mg total) by mouth 2 (two) times daily., Disp: 60 tablet, Rfl: 5   atorvastatin (LIPITOR) 40 MG tablet, Take 40 mg by mouth daily., Disp: , Rfl:    atorvastatin (LIPITOR) 80 MG tablet, Take 1 tablet (80 mg total) by mouth daily. (Patient not taking: Reported on 01/15/2022), Disp: 30 tablet, Rfl: 11   carvedilol (COREG) 3.125 MG tablet, Take 3.125 mg by mouth 2 (two) times daily., Disp: , Rfl:    clopidogrel (PLAVIX) 75 MG tablet, Take 1 tablet (75 mg total) by mouth daily., Disp: 30 tablet, Rfl: 6   digoxin (LANOXIN) 0.125 MG tablet, Take 1 tablet (0.125 mg total) by mouth daily., Disp: 30 tablet, Rfl: 6   empagliflozin (JARDIANCE) 10 MG TABS tablet, Take 1 tablet (10 mg total) by mouth daily., Disp: 30 tablet, Rfl: 6   famotidine (PEPCID) 20 MG tablet, Take 1 tablet (20 mg total) by mouth daily., Disp: 30 tablet, Rfl: 6   Glycerin-Hypromellose-PEG 400 0.2-0.2-1 % SOLN, Place 1 drop into both eyes as needed. (Patient taking  differently: Place 1 drop into both eyes as needed (itchy, redness).), Disp: 15 mL, Rfl: 0   isosorbide-hydrALAZINE (BIDIL) 20-37.5 MG tablet, Take 1 tablet by mouth 3 (three) times daily., Disp: 90 tablet, Rfl: 6   ivabradine (CORLANOR) 5 MG TABS tablet, Take 1 tablet (5 mg total) by mouth 2 (two) times daily with a meal., Disp: 60 tablet, Rfl: 6 Allergies  Allergen Reactions   Shellfish Allergy Anaphylaxis   Peanut-Containing Drug Products Itching      Social History   Socioeconomic History   Marital status: Significant Other    Spouse name: Not on file   Number of children: 7   Years of education: Not on file   Highest education level: 9th grade  Occupational History   Occupation:  unemployed    Comment: experiencing homelessness-living at daily rent motels.  Tobacco Use   Smoking status: Every Day    Packs/day: 1.00    Years: 41.00    Total pack years: 41.00    Types: Cigarettes    Passive exposure: Past   Smokeless tobacco: Never  Vaping Use   Vaping Use: Never used  Substance and Sexual Activity   Alcohol use: Yes    Alcohol/week: 63.0 standard drinks of alcohol    Types: 63 Cans of beer per week    Comment: 3-40oz beer/day x10 years. 1-2 40oz beer/day in 20s/30s   Drug use: Yes    Types: "Crack" cocaine, Cocaine    Comment: last use 01/13/22   Sexual activity: Not on file  Other Topics Concern   Not on file  Social History Narrative   Not on file   Social Determinants of Health   Financial Resource Strain: High Risk (01/10/2022)   Overall Financial Resource Strain (CARDIA)    Difficulty of Paying Living Expenses: Very hard  Food Insecurity: No Food Insecurity (09/19/2021)   Hunger Vital Sign    Worried About Running Out of Food in the Last Year: Never true    Ran Out of Food in the Last Year: Never true  Transportation Needs: Unmet Transportation Needs (09/19/2021)   PRAPARE - Hydrologist (Medical): Yes    Lack of Transportation (Non-Medical): Yes  Physical Activity: Not on file  Stress: Not on file  Social Connections: Not on file  Intimate Partner Violence: Not on file    Physical Exam      Future Appointments  Date Time Provider [place]  01/24/2022  1:20 PM [name], NP PCE-PCE None  01/31/2022  1:00 PM [facility] PHARMACY [facility] None  02/21/2022  8:30 AM [facility] PA/NP [facility] None  02/23/2022 11:00 AM Harland Dingwall L, NP-C [facility]-GR None  04/04/2022 10:10 AM MC ECHO/CH OP [facility]ECHOLAB [facility]  04/04/2022 11:20 AM [name], MD MC-[facility] None  04/18/2022 10:00 AM Janina Mayo, MD [facility] [place], [place] [facility] Paramedic  01/23/22

## 2022-01-24 ENCOUNTER — Encounter: Payer: Self-pay | Admitting: Family

## 2022-01-24 ENCOUNTER — Ambulatory Visit (INDEPENDENT_AMBULATORY_CARE_PROVIDER_SITE_OTHER): Payer: Medicaid Other | Admitting: Family

## 2022-01-24 ENCOUNTER — Telehealth (HOSPITAL_COMMUNITY): Payer: Self-pay

## 2022-01-24 ENCOUNTER — Telehealth: Payer: Self-pay | Admitting: Family

## 2022-01-24 VITALS — BP 138/74 | HR 80 | Temp 97.0°F | Resp 16 | Ht 66.5 in | Wt 165.0 lb

## 2022-01-24 DIAGNOSIS — I050 Rheumatic mitral stenosis: Secondary | ICD-10-CM

## 2022-01-24 DIAGNOSIS — R32 Unspecified urinary incontinence: Secondary | ICD-10-CM

## 2022-01-24 DIAGNOSIS — Z8709 Personal history of other diseases of the respiratory system: Secondary | ICD-10-CM

## 2022-01-24 DIAGNOSIS — Z7689 Persons encountering health services in other specified circumstances: Secondary | ICD-10-CM | POA: Diagnosis not present

## 2022-01-24 DIAGNOSIS — I5042 Chronic combined systolic (congestive) and diastolic (congestive) heart failure: Secondary | ICD-10-CM | POA: Diagnosis not present

## 2022-01-24 DIAGNOSIS — L309 Dermatitis, unspecified: Secondary | ICD-10-CM

## 2022-01-24 DIAGNOSIS — Z8673 Personal history of transient ischemic attack (TIA), and cerebral infarction without residual deficits: Secondary | ICD-10-CM

## 2022-01-24 DIAGNOSIS — D333 Benign neoplasm of cranial nerves: Secondary | ICD-10-CM

## 2022-01-24 DIAGNOSIS — I2510 Atherosclerotic heart disease of native coronary artery without angina pectoris: Secondary | ICD-10-CM

## 2022-01-24 DIAGNOSIS — F17200 Nicotine dependence, unspecified, uncomplicated: Secondary | ICD-10-CM

## 2022-01-24 DIAGNOSIS — I498 Other specified cardiac arrhythmias: Secondary | ICD-10-CM

## 2022-01-24 DIAGNOSIS — Z0100 Encounter for examination of eyes and vision without abnormal findings: Secondary | ICD-10-CM

## 2022-01-24 DIAGNOSIS — Z5900 Homelessness unspecified: Secondary | ICD-10-CM

## 2022-01-24 DIAGNOSIS — Z0120 Encounter for dental examination and cleaning without abnormal findings: Secondary | ICD-10-CM

## 2022-01-24 MED ORDER — TRIAMCINOLONE ACETONIDE 0.5 % EX OINT: 1.0000 | TOPICAL_OINTMENT | Freq: Two times a day (BID) | CUTANEOUS | 2 refills | Status: DC

## 2022-01-24 NOTE — Patient Instructions (Signed)
Thank you for choosing Primary Care at [facility] for your medical home!    Zadkiel Dragan Easom was seen by [name], NP today.   Carver Fila Iturralde's primary care provider is [name], NP.   For the best care possible,  you should try to see [name], NP whenever you come to office.   We look forward to seeing you again soon!  If you have any questions about your visit today,  please call us at [phone]  Or feel free to reach your provider via [place].    Keeping you healthy   Get these tests Blood pressure- Have your blood pressure checked once a year by your healthcare provider.  Normal blood pressure is 120/80. Weight- Have your body mass index (BMI) calculated to screen for obesity.  BMI is a measure of body fat based on height and weight. You can also calculate your own BMI at [employer]velBags.it. Cholesterol- Have your cholesterol checked regularly starting at age 58, sooner may be necessary if you have diabetes, high blood pressure, if a family member developed heart diseases at an early age or if you smoke.  Chlamydia, HIV, and other sexual transmitted disease- Get screened each year until the age of 13 then within three months of each new sexual partner. Diabetes- Have your blood sugar checked regularly if you have high blood pressure, high cholesterol, a family history of diabetes or if you are overweight.   Get these vaccines Flu shot- Every fall. Tetanus shot- Every 10 years. Menactra- Single dose; prevents meningitis.   Take these steps Don't smoke- If you do smoke, ask your healthcare provider about quitting. For tips on how to quit, go to www.smokefree.gov or call 1-800-QUIT-NOW. Be physically active- Exercise 5 days a week for at least 30 minutes.  If you are not already physically active start slow and gradually work up to 30 minutes of moderate physical activity.  Examples of moderate activity include walking briskly, mowing the yard,  dancing, swimming bicycling, etc. Eat a healthy diet- Eat a variety of healthy foods such as fruits, vegetables, low fat milk, low fat cheese, yogurt, lean meats, poultry, fish, beans, tofu, etc.  For more information on healthy eating, go to www.thenutritionsource.org Drink alcohol in moderation- Limit alcohol intake two drinks or less a day.  Never drink and drive. Dentist- Brush and floss teeth twice daily; visit your dentis twice a year. Depression-Your emotional health is as important as your physical health.  If you're feeling down, losing interest in things you normally enjoy please talk with your healthcare provider. Gun Safety- If you keep a gun in your home, keep it unloaded and with the safety lock on.  Bullets should be stored separately. Helmet use- Always wear a helmet when riding a motorcycle, bicycle, rollerblading or skateboarding. Safe sex- If you may be exposed to a sexually transmitted infection, use a condom Seat belts- Seat bels can save your life; always wear one. Smoke/Carbon Monoxide detectors- These detectors need to be installed on the appropriate level of your home.  Replace batteries at least once a year. Skin Cancer- When out in the sun, cover up and use sunscreen SPF 15 or higher. Violence- If anyone is threatening or hurting you, please tell your healthcare provider.

## 2022-01-24 NOTE — Progress Notes (Signed)
Pt presents to establish care, needs referral to Pulmonology for maintenance of asthma inhalers.

## 2022-01-24 NOTE — Telephone Encounter (Signed)
I contacted pharmacy to verify med list b/c he needs refills on his meds- There are 2 different dosages of atorvastatin now listed in his chart-[facility]- and [facility] but it is not in his chart- I sent message to confirm both of these and then I will contact pharmacy back.   Marylouise Stacks, [place] 01/24/2022

## 2022-01-25 ENCOUNTER — Other Ambulatory Visit (HOSPITAL_COMMUNITY): Payer: Self-pay | Admitting: *Deleted

## 2022-01-25 MED ORDER — ATORVASTATIN CALCIUM 40 MG PO TABS: 40.0000 mg | ORAL_TABLET | Freq: Every day | ORAL | 3 refills | Status: DC

## 2022-01-25 MED ORDER — CARVEDILOL 3.125 MG PO TABS: 3.1250 mg | ORAL_TABLET | Freq: Two times a day (BID) | ORAL | 3 refills | Status: DC

## 2022-01-25 NOTE — Telephone Encounter (Signed)
Thank you :)

## 2022-01-26 ENCOUNTER — Telehealth (HOSPITAL_COMMUNITY): Payer: Self-pay | Admitting: Licensed Clinical Social Worker

## 2022-01-26 NOTE — Telephone Encounter (Signed)
H&V Care Navigation CSW Progress Note  Clinical Social Worker aware of pt phone minutes expiring today- was able to refill with 1 month of unlimited minutes so pt can remain in contact with clinic.  Patient is participating in a Managed Medicaid Plan:  Yes  SDOH Screenings   Alcohol Screen: Medium Risk (09/19/2021)   Alcohol Screen    Last Alcohol Screening Score (AUDIT): 16  Depression (PHQ2-9): Low Risk  (01/24/2022)   Depression (PHQ2-9)    PHQ-2 Score: 0  Financial Resource Strain: High Risk (01/26/2022)   Overall Financial Resource Strain (CARDIA)    Difficulty of Paying Living Expenses: Very hard  Food Insecurity: No Food Insecurity (09/19/2021)   Hunger Vital Sign    Worried About Running Out of Food in the Last Year: Never true    Ran Out of Food in the Last Year: Never true  Housing: High Risk (01/01/2022)   Housing    Last Housing Risk Score: 3  Physical Activity: Not on file  Social Connections: Not on file  Stress: Not on file  Tobacco Use: High Risk (01/24/2022)   Patient History    Smoking Tobacco Use: Every Day    Smokeless Tobacco Use: Never    Passive Exposure: Current  Transportation Needs: Unmet Transportation Needs (09/19/2021)   PRAPARE - Transportation    Lack of Transportation (Medical): Yes    Lack of Transportation (Non-Medical): Yes    Jorge Ny, [place] [facility] Desk#: [phone] Cell#: [phone]

## 2022-01-29 ENCOUNTER — Other Ambulatory Visit (HOSPITAL_COMMUNITY): Payer: Self-pay

## 2022-01-31 ENCOUNTER — Telehealth (HOSPITAL_COMMUNITY): Payer: Self-pay | Admitting: Licensed Clinical Social Worker

## 2022-01-31 ENCOUNTER — Ambulatory Visit: Payer: Medicaid Other | Admitting: Physical Therapy

## 2022-01-31 ENCOUNTER — Ambulatory Visit (HOSPITAL_COMMUNITY)
Admission: RE | Admit: 2022-01-31 | Discharge: 2022-01-31 | Disposition: A | Discharge status: Home / Self Care | Payer: Medicaid Other | Source: Ambulatory Visit | Attending: Internal Medicine | Admitting: Internal Medicine

## 2022-01-31 ENCOUNTER — Other Ambulatory Visit (HOSPITAL_COMMUNITY): Payer: Self-pay

## 2022-01-31 VITALS — BP 148/74 | HR 71 | Wt 165.4 lb

## 2022-01-31 DIAGNOSIS — I2510 Atherosclerotic heart disease of native coronary artery without angina pectoris: Secondary | ICD-10-CM | POA: Insufficient documentation

## 2022-01-31 DIAGNOSIS — I110 Hypertensive heart disease with heart failure: Secondary | ICD-10-CM | POA: Insufficient documentation

## 2022-01-31 DIAGNOSIS — I5022 Chronic systolic (congestive) heart failure: Secondary | ICD-10-CM | POA: Insufficient documentation

## 2022-01-31 DIAGNOSIS — D333 Benign neoplasm of cranial nerves: Secondary | ICD-10-CM | POA: Insufficient documentation

## 2022-01-31 DIAGNOSIS — I252 Old myocardial infarction: Secondary | ICD-10-CM | POA: Insufficient documentation

## 2022-01-31 DIAGNOSIS — Z5900 Homelessness unspecified: Secondary | ICD-10-CM | POA: Diagnosis not present

## 2022-01-31 DIAGNOSIS — I428 Other cardiomyopathies: Secondary | ICD-10-CM | POA: Insufficient documentation

## 2022-01-31 DIAGNOSIS — I639 Cerebral infarction, unspecified: Secondary | ICD-10-CM | POA: Insufficient documentation

## 2022-01-31 DIAGNOSIS — I050 Rheumatic mitral stenosis: Secondary | ICD-10-CM | POA: Insufficient documentation

## 2022-01-31 DIAGNOSIS — I5043 Acute on chronic combined systolic (congestive) and diastolic (congestive) heart failure: Secondary | ICD-10-CM | POA: Diagnosis not present

## 2022-01-31 DIAGNOSIS — F1910 Other psychoactive substance abuse, uncomplicated: Secondary | ICD-10-CM | POA: Diagnosis not present

## 2022-01-31 DIAGNOSIS — I213 ST elevation (STEMI) myocardial infarction of unspecified site: Secondary | ICD-10-CM | POA: Diagnosis not present

## 2022-01-31 LAB — BASIC METABOLIC PANEL
Anion gap: 7 (ref 5–15)
BUN: 18 mg/dL (ref 6–20)
CO2: 26 mmol/L (ref 22–32)
Calcium: 9 mg/dL (ref 8.9–10.3)
Chloride: 108 mmol/L (ref 98–111)
Creatinine, Ser: 1.56 mg/dL — ABNORMAL HIGH (ref 0.61–1.24)
GFR, Estimated: 54 mL/min — ABNORMAL LOW (ref >60)
Glucose, Bld: 90 mg/dL (ref 70–99)
Potassium: 4.1 mmol/L (ref 3.5–5.1)
Sodium: 141 mmol/L (ref 135–145)

## 2022-01-31 LAB — BRAIN NATRIURETIC PEPTIDE: B Natriuretic Peptide: 459.2 pg/mL — ABNORMAL HIGH (ref 0.0–100.0)

## 2022-01-31 MED ORDER — SACUBITRIL-VALSARTAN 24-26 MG PO TABS: 1.0000 | ORAL_TABLET | Freq: Two times a day (BID) | ORAL | 5 refills | Status: DC

## 2022-01-31 NOTE — Telephone Encounter (Signed)
H&V Care Navigation CSW Progress Note  Clinical Social Worker contacted patient by phone to discuss transportation.  Patient is participating in a Managed Medicaid Plan:  Yes  SDOH Screenings   Alcohol Screen: Medium Risk (09/19/2021)   Alcohol Screen    Last Alcohol Screening Score (AUDIT): 16  Depression (PHQ2-9): Low Risk  (01/24/2022)   Depression (PHQ2-9)    PHQ-2 Score: 0  Financial Resource Strain: High Risk (01/26/2022)   Overall Financial Resource Strain (CARDIA)    Difficulty of Paying Living Expenses: Very hard  Food Insecurity: No Food Insecurity (09/19/2021)   Hunger Vital Sign    Worried About Running Out of Food in the Last Year: Never true    Ran Out of Food in the Last Year: Never true  Housing: High Risk (01/01/2022)   Housing    Last Housing Risk Score: 3  Physical Activity: Not on file  Social Connections: Not on file  Stress: Not on file  Tobacco Use: High Risk (01/24/2022)   Patient History    Smoking Tobacco Use: Every Day    Smokeless Tobacco Use: Never    Passive Exposure: Current  Transportation Needs: Unmet Transportation Needs (01/31/2022)   PRAPARE - Hydrologist (Medical): Yes    Lack of Transportation (Non-Medical): Yes    CSW set up taxi to get pt to pharmacy appt today.  Pt reports he had meeting with team with [place] who are trying to enroll pt with PATH program to get into permanent housing and potentially into hotel room temporarily while they work on this.  Will continue to follow and assist as needed  Jorge Ny, [place] [facility] Desk#: [phone] Cell#: [phone]

## 2022-01-31 NOTE — Progress Notes (Signed)
Paramedicine Encounter   Patient ID: Craig Schmidt , male,   DOB: 06-19-1971,51 y.o.,  MRN: [id number]  Pt does have some swelling to his legs right now, he is living on streets so eating fast foods happens often for him.  Will f/u next week.  He was given scale today in clinic.    Met patient in clinic today with provider. Weight @ clinic-165 B/P-148/74 P-71  Med changes-starting entresto back on 24/26   Laporchia Nakajima, [place] 01/31/2022

## 2022-01-31 NOTE — Patient Instructions (Addendum)
It was a pleasure seeing you today!  MEDICATIONS: -We are changing your medications today -Start Entresto 24/26 mg (1 tablet) twice daily -Call if you have questions about your medications.  LABS: -We will call you if your labs need attention.  NEXT APPOINTMENT: Return to clinic in 3 weeks with APP Clinic.  In general, to take care of your heart failure: -Limit your fluid intake to 2 Liters (half-gallon) per day.   -Limit your salt intake to ideally 2-3 grams (2000-3000 mg) per day. -Weigh yourself daily and record, and bring that "weight diary" to your next appointment.  (Weight gain of 2-3 pounds in 1 day typically means fluid weight.) -The medications for your heart are to help your heart and help you live longer.   -Please contact us before stopping any of your heart medications.  Call the clinic at [phone] with questions or to reschedule future appointments.

## 2022-02-01 ENCOUNTER — Telehealth (HOSPITAL_COMMUNITY): Payer: Self-pay | Admitting: Pharmacist

## 2022-02-01 ENCOUNTER — Other Ambulatory Visit (HOSPITAL_COMMUNITY): Payer: Self-pay

## 2022-02-01 NOTE — Telephone Encounter (Signed)
Advanced Heart Failure Patient Advocate Encounter  Prior Authorization for Delene Loll has been approved.    PA# [id number] Effective dates: 02/01/22 through 02/01/23  Audry Riles, PharmD, BCPS, BCCP, CPP Heart Failure Clinic Pharmacist [phone]

## 2022-02-05 ENCOUNTER — Emergency Department (HOSPITAL_COMMUNITY): Payer: Medicaid Other

## 2022-02-05 ENCOUNTER — Other Ambulatory Visit: Payer: Self-pay

## 2022-02-05 ENCOUNTER — Emergency Department (HOSPITAL_COMMUNITY)
Admission: EM | Admit: 2022-02-05 | Discharge: 2022-02-06 | Disposition: A | Discharge status: Home / Self Care | Payer: Medicaid Other | Attending: Emergency Medicine | Admitting: Emergency Medicine

## 2022-02-05 ENCOUNTER — Encounter (HOSPITAL_COMMUNITY): Payer: Self-pay | Admitting: Emergency Medicine

## 2022-02-05 DIAGNOSIS — R079 Chest pain, unspecified: Secondary | ICD-10-CM | POA: Diagnosis not present

## 2022-02-05 DIAGNOSIS — R0789 Other chest pain: Secondary | ICD-10-CM | POA: Diagnosis not present

## 2022-02-05 DIAGNOSIS — Z5321 Procedure and treatment not carried out due to patient leaving prior to being seen by health care provider: Secondary | ICD-10-CM | POA: Insufficient documentation

## 2022-02-05 LAB — TROPONIN I (HIGH SENSITIVITY)
Troponin I (High Sensitivity): 50 ng/L — ABNORMAL HIGH (ref <18)
Troponin I (High Sensitivity): 42 ng/L — ABNORMAL HIGH (ref <18)

## 2022-02-05 LAB — COMPREHENSIVE METABOLIC PANEL
ALT: 10 U/L (ref 0–44)
AST: 32 U/L (ref 15–41)
Albumin: 3.5 g/dL (ref 3.5–5.0)
Alkaline Phosphatase: 85 U/L (ref 38–126)
Anion gap: 15 (ref 5–15)
BUN: 17 mg/dL (ref 6–20)
CO2: 20 mmol/L — ABNORMAL LOW (ref 22–32)
Calcium: 9.6 mg/dL (ref 8.9–10.3)
Chloride: 106 mmol/L (ref 98–111)
Creatinine, Ser: 1.39 mg/dL — ABNORMAL HIGH (ref 0.61–1.24)
GFR, Estimated: >60 mL/min (ref >60)
Glucose, Bld: 115 mg/dL — ABNORMAL HIGH (ref 70–99)
Potassium: 4.3 mmol/L (ref 3.5–5.1)
Sodium: 141 mmol/L (ref 135–145)
Total Bilirubin: 0.2 mg/dL — ABNORMAL LOW (ref 0.3–1.2)
Total Protein: 7 g/dL (ref 6.5–8.1)

## 2022-02-05 LAB — CBC WITH DIFFERENTIAL/PLATELET
Abs Immature Granulocytes: 0.03 10*3/uL (ref 0.00–0.07)
Basophils Absolute: 0 10*3/uL (ref 0.0–0.1)
Basophils Relative: 0 %
Eosinophils Absolute: 0.2 10*3/uL (ref 0.0–0.5)
Eosinophils Relative: 2 %
HCT: 40.7 % (ref 39.0–52.0)
Hemoglobin: 12.8 g/dL — ABNORMAL LOW (ref 13.0–17.0)
Immature Granulocytes: 0 %
Lymphocytes Relative: 14 %
Lymphs Abs: 1.3 10*3/uL (ref 0.7–4.0)
MCH: 29.3 pg (ref 26.0–34.0)
MCHC: 31.4 g/dL (ref 30.0–36.0)
MCV: 93.1 fL (ref 80.0–100.0)
Monocytes Absolute: 0.7 10*3/uL (ref 0.1–1.0)
Monocytes Relative: 7 %
Neutro Abs: 7.1 10*3/uL (ref 1.7–7.7)
Neutrophils Relative %: 77 %
Platelets: 327 10*3/uL (ref 150–400)
RBC: 4.37 MIL/uL (ref 4.22–5.81)
RDW: 14.4 % (ref 11.5–15.5)
WBC: 9.4 10*3/uL (ref 4.0–10.5)
nRBC: 0 % (ref 0.0–0.2)

## 2022-02-05 LAB — BRAIN NATRIURETIC PEPTIDE: B Natriuretic Peptide: 330.6 pg/mL — ABNORMAL HIGH (ref 0.0–100.0)

## 2022-02-05 NOTE — ED Notes (Signed)
Patent stated that they are going to another hospital and leaving

## 2022-02-05 NOTE — ED Provider Triage Note (Signed)
Emergency Medicine Provider Triage Evaluation Note  Craig Schmidt , a 51 y.o. male  was evaluated in triage.  Pt complains of chest pain that started 45-60 ago. The patient reports that he wanted to make sure he wasn't having another heart attack. Reports he did have SOB in the beginning, but none now. The patient last used cocaine yesterday. Feels tightness in the right side of his chest, no radiation. Reports lightheadedness as well. Denies any palpitations. Reports a cough as well. He reports that he is compliant with his medications. Recent MI at the end of May 2023.   Review of Systems  Positive:  Negative:  Physical Exam  BP 119/83 (BP Location: Left Arm)   Pulse 87   Temp 98.7 F (37.1 C) (Oral)   Resp 15   SpO2 100%  Gen:   Awake, no distress   Resp:  Normal effort  MSK:   Moves extremities without difficulty  Other:  RRR. Baseline RUE weakness.   Medical Decision Making  Medically screening exam initiated at 12:46 PM.  Appropriate orders placed.  Craig Schmidt was informed that the remainder of the evaluation will be completed by another provider, this initial triage assessment does not replace that evaluation, and the importance of remaining in the ED until their evaluation is complete.  He is currently homeless and is wearing fleece pants. Concerned that he may be overheated as well. Vital signs are stable. Will order chest pain workup. Friend reports that he is walking and talking at baseline.    Sherrell Puller, [name] 02/05/22 1254

## 2022-02-05 NOTE — ED Triage Notes (Signed)
Pt states he started having CP about 30 mins ago. It feels like a tightness in his chest. Pt states I feel like I'm having another heart attack. Denies other symptoms

## 2022-02-06 ENCOUNTER — Telehealth: Payer: Self-pay

## 2022-02-06 ENCOUNTER — Other Ambulatory Visit: Payer: Self-pay | Admitting: Family

## 2022-02-06 ENCOUNTER — Ambulatory Visit: Payer: Medicaid Other | Attending: Family

## 2022-02-06 DIAGNOSIS — R293 Abnormal posture: Secondary | ICD-10-CM | POA: Diagnosis not present

## 2022-02-06 DIAGNOSIS — Z8673 Personal history of transient ischemic attack (TIA), and cerebral infarction without residual deficits: Secondary | ICD-10-CM | POA: Diagnosis not present

## 2022-02-06 DIAGNOSIS — R29898 Other symptoms and signs involving the musculoskeletal system: Secondary | ICD-10-CM

## 2022-02-06 DIAGNOSIS — R262 Difficulty in walking, not elsewhere classified: Secondary | ICD-10-CM | POA: Insufficient documentation

## 2022-02-06 NOTE — Therapy (Signed)
OUTPATIENT PHYSICAL THERAPY NEURO EVALUATION   Patient Name: Craig Schmidt MRN: [id number] DOB:[birth year], 51 y.o., male Today's Date: 02/06/2022   PCP: [name], NP REFERRING PROVIDER: [name], NP   PT End of Session - 02/06/22 1050     Visit Number 1    Authorization Type [place] medicaid    PT Start Time 1101    PT Stop Time 1145    PT Time Calculation (min) 44 min    Activity Tolerance Patient tolerated treatment well    Behavior During Therapy WFL for tasks assessed/performed             Past Medical History:  Diagnosis Date   Asthma    Brain tumor ([place])    MI (myocardial infarction) ([place])    Stroke [facility])    Past Surgical History:  Procedure Laterality Date   BUBBLE STUDY  09/11/2021   Procedure: BUBBLE STUDY;  Surgeon: Fay Records, MD;  Location: [place];  Service: Cardiovascular;;   LEFT HEART CATH AND CORONARY ANGIOGRAPHY N/A 12/19/2021   Procedure: LEFT HEART CATH AND CORONARY ANGIOGRAPHY;  Surgeon: Belva Crome, MD;  Location: [place] CV LAB;  Service: Cardiovascular;  Laterality: N/A;   TEE WITHOUT CARDIOVERSION N/A 09/11/2021   Procedure: TRANSESOPHAGEAL ECHOCARDIOGRAM (TEE);  Surgeon: Fay Records, MD;  Location: [facility] ENDOSCOPY;  Service: Cardiovascular;  Laterality: N/A;   Patient Active Problem List   Diagnosis Date Noted   Chronic combined systolic and diastolic heart failure (HCC)    ST elevation myocardial infarction (STEMI) of inferolateral wall, subsequent episode of care ([place]) 12/19/2021   ST elevation myocardial infarction (STEMI) ([place])    Ischemic cardiomyopathy    Cocaine abuse ([place])    Smoker    Alcohol abuse    Acute ischemic stroke ([place]) 09/08/2021   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   Tear of MCL (medial collateral ligament) of knee 05/20/2015   Protein-calorie malnutrition, severe 05/17/2015   Lactic acidosis 05/15/2015   Left knee pain 05/15/2015   Lung nodules 05/15/2015   Hypoglycemia  05/14/2015   Hypothermia 05/14/2015   Acute encephalopathy 05/14/2015   Cocaine abuse with intoxication ([place]) 05/14/2015   Alcohol intoxication in active alcoholic ([place]) 40/98/1191   Sepsis ([place]) 05/14/2015   Homeless 05/14/2015    ONSET DATE: referral 01/24/22  REFERRING DIAG: Y78.29 (ICD-10-CM) - History of CVA (cerebrovascular accident)  THERAPY DIAG:  Abnormal posture  Difficulty in walking, not elsewhere classified  Rationale for Evaluation and Treatment Rehabilitation  SUBJECTIVE:  SUBJECTIVE STATEMENT: Patient reports doing well. He denies falls. States that he walked to this clinic from [employer] (~1 mile) via [employer] without [place].  Pt accompanied by: self  PERTINENT HISTORY: asthma, previous substance abuse, CHF, recent MI   PAIN:  Are you having pain? No  VITALS  BP: 162/98  BP after gait speed: 168/97  BP after FGA: 156/79  PRECAUTIONS: Fall  WEIGHT BEARING RESTRICTIONS No  FALLS: Has patient fallen in last 6 months? No  LIVING ENVIRONMENT: Lives with: is homeless Has following equipment at home: Single point cane and Wheelchair (manual)  PLOF: Independent  PATIENT GOALS  "to get my hand better"  OBJECTIVE:   COGNITION: Overall cognitive status: Within functional limits for tasks assessed   SENSATION: WFL  COORDINATION: WFL B LE (heel/shin + figure 8)    POSTURE: rounded shoulders, forward head, increased thoracic kyphosis, posterior pelvic tilt, and flexed trunk    LOWER EXTREMITY MMT:    MMT Right Eval Left Eval  Hip flexion 4 5  Hip extension    Hip abduction 5 5  Hip adduction 5 5  Hip internal rotation    Hip external rotation    Knee flexion 4 5  Knee extension 5 5  Ankle dorsiflexion 5 5  Ankle plantarflexion 5 5  Ankle inversion     Ankle eversion    (Blank rows = not tested)  BED MOBILITY:  Currently sleeping on the floor on blankets- reports intermittent difficulty getting on and off the floor, but able to compensate using the wall or external assist  TRANSFERS: Assistive device utilized: None  Sit to stand: Complete Independence Stand to sit: Complete Independence Chair to chair: Complete Independence Floor: Modified independence   STAIRS:  Level of Assistance: Modified independence  Stair Negotiation Technique: Alternating Pattern  with Single Rail on Left  Number of Stairs: 4    Height of Stairs: 6   GAIT: Gait pattern: step through pattern, decreased arm swing- Right, decreased stride length, knee flexed in stance- Right, trunk flexed, wide BOS, poor foot clearance- Right, and poor foot clearance- Left Distance walked: clinic  Assistive device utilized: None Level of assistance: Complete Independence  FUNCTIONAL TESTs:   [facility] PT Assessment - 02/06/22 0001       Standardized Balance Assessment   Standardized Balance Assessment Five Times Sit to Stand    Five times sit to stand comments  17.09    10 Meter Walk .73ms      Berg Balance Test   Sit to Stand Able to stand without using hands and stabilize independently    Standing Unsupported Able to stand safely 2 minutes    Sitting with Back Unsupported but Feet Supported on Floor or Stool Able to sit safely and securely 2 minutes    Stand to Sit Sits safely with minimal use of hands    Transfers Able to transfer safely, minor use of hands    Standing Unsupported with Eyes Closed Able to stand 10 seconds safely    Standing Unsupported with Feet Together Able to place feet together independently and stand 1 minute safely    From Standing, Reach Forward with Outstretched Arm Can reach forward >12 cm safely (5")    From Standing Position, Pick up Object from Floor Able to pick up shoe safely and easily    From Standing Position, Turn to Look Behind  Over each Shoulder Looks behind from both sides and weight shifts well    Turn 360 Degrees  Able to turn 360 degrees safely in 4 seconds or less    Standing Unsupported, Alternately Place Feet on Step/Stool Able to stand independently and complete 8 steps >20 seconds    Standing Unsupported, One Foot in Front Able to plae foot ahead of the other independently and hold 30 seconds    Standing on One Leg Tries to lift leg/unable to hold 3 seconds but remains standing independently    Total Score 50      Functional Gait  Assessment   Gait assessed  Yes    Gait Level Surface Walks 20 ft in less than 5.5 sec, no assistive devices, good speed, no evidence for imbalance, normal gait pattern, deviates no more than 6 in outside of the 12 in walkway width.    Change in Gait Speed Able to smoothly change walking speed without loss of balance or gait deviation. Deviate no more than 6 in outside of the 12 in walkway width.    Gait with Horizontal Head Turns Performs head turns smoothly with no change in gait. Deviates no more than 6 in outside 12 in walkway width    Gait with Vertical Head Turns Performs task with slight change in gait velocity (eg, minor disruption to smooth gait path), deviates 6 - 10 in outside 12 in walkway width or uses assistive device    Gait and Pivot Turn Pivot turns safely within 3 sec and stops quickly with no loss of balance.    Step Over Obstacle Is able to step over one shoe box (4.5 in total height) without changing gait speed. No evidence of imbalance.    Gait with Narrow Base of Support Ambulates 4-7 steps.    Gait with Eyes Closed Walks 20 ft, uses assistive device, slower speed, mild gait deviations, deviates 6-10 in outside 12 in walkway width. Ambulates 20 ft in less than 9 sec but greater than 7 sec.    Ambulating Backwards Walks 20 ft, uses assistive device, slower speed, mild gait deviations, deviates 6-10 in outside 12 in walkway width.    Steps Alternating feet, must use  rail.    Total Score 23             TODAY'S TREATMENT:  N/a eval   PATIENT EDUCATION: Education details: PT POC, OM results Person educated: Patient Education method: Explanation Education comprehension: verbalized understanding   HOME EXERCISE PROGRAM: To be provided    GOALS: Goals reviewed with patient? Yes  SHORT TERM GOALS:  = LTG based on POC length   LONG TERM GOALS: Target date: 03/06/2022  Pt will be independent with final HEP for improved balance and functional strength   Baseline: to be provided Goal status: INITIAL  2.  Pt will improve FGA to >/= 28/30 to demonstrate improved balance and reduced fall risk  Baseline: 23/30 Goal status: INITIAL  3.  Pt will improve gait speed to >/= .56ms  to demonstrate improved community ambulation  Baseline: .371m Goal status: INITIAL   ASSESSMENT:  CLINICAL IMPRESSION: Patient is a [age].o. male who was seen today for physical therapy evaluation and treatment for gait imbalance related to CVA in February 2023. Patient scored a 23/30 on  Functional Gait Assessment.   <22/30 = predictive of falls, <20/30 = fall in 6 months, <18/30 = predictive of falls in PD MCID: 5 points stroke population, 4 points geriatric population (ANPTA Core Set of Outcome Measures for Adults with Neurologic Conditions, 2018). 10 Meter Walk Test: Patient instructed to walk  10 meters (32.8 ft) as quickly and as safely as possible at their normal speed x2 and at a fast speed x2. Time measured from 2 meter mark to 8 meter mark to accommodate ramp-up and ramp-down.  Normal speed: .26ms  Cut off scores: <0.4 m/s = household Ambulator, 0.4-0.8 m/s = limited community Ambulator, >0.8 m/s = community Ambulator, >1.2 m/s = crossing a street, <1.0 = increased fall risk MCID 0.05 m/s (small), 0.13 m/s (moderate), 0.06 m/s (significant)  (ANPTA Core Set of Outcome Measures for Adults with Neurologic Conditions, 2018). Patient scored a 50/56 on the  [facility] System[facility].  <45/56 = fall risk, <42/56 = predictive of recurrent falls, <40/56 = 100% fall risk  >41 = independent, 21-40 = assistive device, 0-20 = wheelchair level  MDC 6.9 (4 pts 45-56, 5 pts 35-44, 7 pts 25-34) (ANPTA Core Set of Outcome Measures for Adults with Neurologic Conditions, 2018). He would benefit from skilled PT services to address the above mentioned deficits.   OBJECTIVE IMPAIRMENTS Abnormal gait, cardiopulmonary status limiting activity, decreased activity tolerance, decreased balance, decreased endurance, decreased knowledge of condition, decreased mobility, difficulty walking, decreased strength, decreased safety awareness, impaired tone, improper body mechanics, and postural dysfunction.   ACTIVITY LIMITATIONS carrying, lifting, bending, squatting, stairs, locomotion level, and caring for others  PARTICIPATION LIMITATIONS: interpersonal relationship, driving, shopping, community activity, and yard work  PERSONAL FACTORS Behavior pattern, Education, Past/current experiences, Social background, Time since onset of injury/illness/exacerbation, Transportation, and 3+ comorbidities: substance abuse, homelessness, MI x2  are also affecting patient's functional outcome.   REHAB POTENTIAL: Fair time since onset  CLINICAL DECISION MAKING: Stable/uncomplicated  EVALUATION COMPLEXITY: Low  PLAN: PT FREQUENCY: 2x/week  PT DURATION: 4 weeks  PLANNED INTERVENTIONS: Therapeutic exercises, Therapeutic activity, Neuromuscular re-education, Balance training, Gait training, Patient/Family education, Self Care, Joint mobilization, Stair training, Vestibular training, Canalith repositioning, Visual/preceptual remediation/compensation, Orthotic/Fit training, DME instructions, Aquatic Therapy, Cognitive remediation, Electrical stimulation, Manual therapy, and Re-evaluation  PLAN FOR NEXT SESSION: provide HEP, postural work, assess MTCSIB  Managed medicaid CPT codes: [phone]- PT  Re-evaluation, 97110- Therapeutic Exercise, [id number] Neuro Re-education, [phone]- Gait Training, [id number]- Manual Therapy, [id number]- Therapeutic Activities, [phone]- Self Care, 97014 - Electrical stimulation (unattended), [id number]- Electrical stimulation (Manual), [place] [id number]- Physical performance training, [id number]- Aquatic therapy, and [id number]- Canalith Repositioning   JDebbora Dus PT JDebbora Dus PT, DPT, CBIS  02/06/2022, 11:47 AM

## 2022-02-06 NOTE — Telephone Encounter (Signed)
Order complete. 

## 2022-02-06 NOTE — Telephone Encounter (Signed)
[name], NP  Nechama Guard was evaluated by Estevan Ryder, PT, DPT on 7/18.  The patient would benefit from an OT evaluation for R UE impairments.   If you agree, please place an order in [facility] workque in [facility] or fax the order to [phone].  Thank you, Debbora Dus, PT, DPT, [facility] [address] [place], [place]  [address] Phone:  [phone] Fax:  [phone]

## 2022-02-07 ENCOUNTER — Telehealth (HOSPITAL_COMMUNITY): Payer: Self-pay | Admitting: Licensed Clinical Social Worker

## 2022-02-07 NOTE — Telephone Encounter (Signed)
H&V Care Navigation CSW Progress Note  Clinical Social Worker contacted patient by phone to discuss need for compression socks.  Patient is participating in a Managed Medicaid Plan:  Yes  Pt informed CSW that he is supposed to wear compression socks but can't afford them- CSW able to order through H&V medical equipment program- paramedic will take out to patient when they arrive  SDOH Screenings   Alcohol Screen: Medium Risk (09/19/2021)   Alcohol Screen    Last Alcohol Screening Score (AUDIT): 16  Depression (PHQ2-9): Low Risk  (01/24/2022)   Depression (PHQ2-9)    PHQ-2 Score: 0  Financial Resource Strain: High Risk (01/26/2022)   Overall Financial Resource Strain (CARDIA)    Difficulty of Paying Living Expenses: Very hard  Food Insecurity: No Food Insecurity (09/19/2021)   Hunger Vital Sign    Worried About Running Out of Food in the Last Year: Never true    Ran Out of Food in the Last Year: Never true  Housing: High Risk (01/01/2022)   Housing    Last Housing Risk Score: 3  Physical Activity: Not on file  Social Connections: Not on file  Stress: Not on file  Tobacco Use: High Risk (02/06/2022)   Patient History    Smoking Tobacco Use: Every Day    Smokeless Tobacco Use: Never    Passive Exposure: Current  Transportation Needs: Unmet Transportation Needs (01/31/2022)   PRAPARE - Transportation    Lack of Transportation (Medical): Yes    Lack of Transportation (Non-Medical): Yes   Jorge Ny, [place] [facility] Desk#: [phone] Cell#: [phone]

## 2022-02-08 ENCOUNTER — Telehealth: Payer: Self-pay | Admitting: Family

## 2022-02-08 NOTE — Telephone Encounter (Signed)
Pt has upcoming appt on 08/07 he can discontinue use until he speaks with his provider.

## 2022-02-08 NOTE — Telephone Encounter (Signed)
Att to contact pt no ans goes directly to voicemail and mailbox full

## 2022-02-08 NOTE — Telephone Encounter (Signed)
Copied from [place] [phone]. Topic: General - Inquiry >> Feb 08, 2022  9:57 AM Penni Bombard wrote: Reason for CRM: Pt called saying the medication that was prescribed for him for itching is not working.  He would like a nurse to call him back.

## 2022-02-12 ENCOUNTER — Telehealth (HOSPITAL_COMMUNITY): Payer: Self-pay

## 2022-02-12 NOTE — Telephone Encounter (Signed)
Attempted to reach pt ref visit this week, no answer.   Craig Schmidt, [place] 02/12/2022

## 2022-02-13 ENCOUNTER — Ambulatory Visit: Payer: Medicaid Other

## 2022-02-14 ENCOUNTER — Other Ambulatory Visit (HOSPITAL_COMMUNITY): Payer: Self-pay

## 2022-02-14 NOTE — Progress Notes (Signed)
Paramedicine Encounter    Patient ID: Craig Schmidt, male    DOB: 1971-06-10, 51 y.o.   MRN: [id number]  Pt reports he is doing well. He has been missing doses of his meds-he is past due need for refills from [place].  He denies increased sob, no dizziness. Legs look good.  Food/fluid intake best it can be considering situation.  The lady from [facility] regarding first path for his housing came by today and got him signed up.   Meds verified.  Refills needed:  Corlanor Eliquis  Called in both of those and they have to be ordered and will be ready tomor after 3.    BP 118/80   Pulse 80   Resp 18   Wt 160 lb (72.6 kg)   SpO2 98%   BMI 25.44 kg/m  Weight yesterday-160 lb Last visit weight-165  Patient Care Team: [name], NP as PCP - General (Nurse Practitioner) Janina Mayo, MD as PCP - Cardiology (Cardiology) Barbaraann Faster, RN as [facility]  Patient Active Problem List   Diagnosis Date Noted   Chronic combined systolic and diastolic heart failure (HCC)    ST elevation myocardial infarction (STEMI) of inferolateral wall, subsequent episode of care ([place]) 12/19/2021   ST elevation myocardial infarction (STEMI) ([place])    Ischemic cardiomyopathy    Cocaine abuse ([place])    Smoker    Alcohol abuse    Acute ischemic stroke ([place]) 09/08/2021   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   Tear of MCL (medial collateral ligament) of knee 05/20/2015   Protein-calorie malnutrition, severe 05/17/2015   Lactic acidosis 05/15/2015   Left knee pain 05/15/2015   Lung nodules 05/15/2015   Hypoglycemia 05/14/2015   Hypothermia 05/14/2015   Acute encephalopathy 05/14/2015   Cocaine abuse with intoxication ([place]) 05/14/2015   Alcohol intoxication in active alcoholic ([place]) 00/93/8182   Sepsis ([place]) 05/14/2015   Homeless 05/14/2015    Current Outpatient Medications:    apixaban (ELIQUIS) 5 MG TABS tablet, Take 1 tablet (5 mg total) by  mouth 2 (two) times daily., Disp: 60 tablet, Rfl: 5   atorvastatin (LIPITOR) 40 MG tablet, Take 1 tablet (40 mg total) by mouth daily., Disp: 30 tablet, Rfl: 3   carvedilol (COREG) 3.125 MG tablet, Take 1 tablet (3.125 mg total) by mouth 2 (two) times daily., Disp: 60 tablet, Rfl: 3   clopidogrel (PLAVIX) 75 MG tablet, Take 1 tablet (75 mg total) by mouth daily., Disp: 30 tablet, Rfl: 6   digoxin (LANOXIN) 0.125 MG tablet, Take 1 tablet (0.125 mg total) by mouth daily., Disp: 30 tablet, Rfl: 6   empagliflozin (JARDIANCE) 10 MG TABS tablet, Take 1 tablet (10 mg total) by mouth daily., Disp: 30 tablet, Rfl: 6   famotidine (PEPCID) 20 MG tablet, Take 1 tablet (20 mg total) by mouth daily., Disp: 30 tablet, Rfl: 6   Glycerin-Hypromellose-PEG 400 0.2-0.2-1 % SOLN, Place 1 drop into both eyes as needed. (Patient taking differently: Place 1 drop into both eyes as needed (itchy, redness).), Disp: 15 mL, Rfl: 0   isosorbide-hydrALAZINE (BIDIL) 20-37.5 MG tablet, Take 1 tablet by mouth 3 (three) times daily., Disp: 90 tablet, Rfl: 6   ivabradine (CORLANOR) 5 MG TABS tablet, Take 1 tablet (5 mg total) by mouth 2 (two) times daily with a meal., Disp: 60 tablet, Rfl: 6   sacubitril-valsartan (ENTRESTO) 24-26 MG, Take 1 tablet by mouth 2 (two) times daily., Disp: 60 tablet, Rfl: 5  triamcinolone ointment (KENALOG) 0.5 %, Apply 1 Application topically 2 (two) times daily., Disp: 60 g, Rfl: 2 Allergies  Allergen Reactions   Shellfish Allergy Anaphylaxis   Peanut-Containing Drug Products Itching      Social History   Socioeconomic History   Marital status: Significant Other    Spouse name: Not on file   Number of children: 7   Years of education: Not on file   Highest education level: 9th grade  Occupational History   Occupation: unemployed    Comment: experiencing homelessness-living at daily rent motels.  Tobacco Use   Smoking status: Every Day    Packs/day: 1.00    Years: 41.00    Total pack  years: 41.00    Types: Cigarettes    Passive exposure: Current   Smokeless tobacco: Never  Vaping Use   Vaping Use: Never used  Substance and Sexual Activity   Alcohol use: Yes    Alcohol/week: 63.0 standard drinks of alcohol    Types: 63 Cans of beer per week    Comment: 3-40oz beer/day x10 years. 1-2 40oz beer/day in 20s/30s   Drug use: Yes    Types: "Crack" cocaine, Cocaine    Comment: last use 01/13/22   Sexual activity: Not on file  Other Topics Concern   Not on file  Social History Narrative   Not on file   Social Determinants of Health   Financial Resource Strain: High Risk (01/26/2022)   Overall Financial Resource Strain (CARDIA)    Difficulty of Paying Living Expenses: Very hard  Food Insecurity: No Food Insecurity (09/19/2021)   Hunger Vital Sign    Worried About Running Out of Food in the Last Year: Never true    Ran Out of Food in the Last Year: Never true  Transportation Needs: Unmet Transportation Needs (01/31/2022)   PRAPARE - Transportation    Lack of Transportation (Medical): Yes    Lack of Transportation (Non-Medical): Yes  Physical Activity: Not on file  Stress: Not on file  Social Connections: Not on file  Intimate Partner Violence: Not on file    Physical Exam      Future Appointments  Date Time Provider [place]  02/16/2022  9:30 AM Debbora Dus, PT [facility]  02/19/2022 10:15 AM Debbora Dus, PT [facility]  02/21/2022  8:30 AM MC-[facility] PA/NP MC-HV[facility] None  02/23/2022 10:15 AM Plaster, Cruzita Lederer, PT [facility]NR O[facility]  02/23/2022 11:00 AM Henson, Vickie L, NP-C LB[facility]-GR None  02/26/2022  2:00 PM [name], NP PCE-PCE None  02/27/2022  9:30 AM Debbora Dus, PT [facility]  02/27/2022 10:15 AM Delton [place], OT [facility]  03/02/2022 10:15 AM Debbora Dus, PT [facility]  03/06/2022 10:15 AM Debbora Dus, PT [facility]  03/09/2022 10:15 AM Debbora Dus, PT [facility]  03/13/2022 10:15 AM Debbora Dus, PT [facility]  03/16/2022 10:15 AM Debbora Dus, PT [facility]NR [facility]  03/20/2022 10:15 AM Debbora Dus, PT [facility]  04/04/2022 10:10 AM MC [place] MC[facility]ECHOLAB [facility]  04/04/2022 11:20 AM [name], MD MC-HVSC None  04/18/2022 10:00 AM Janina Mayo, MD CVD-NORTHLIN [facility]  04/26/2022  8:30 AM Garvin Fila, MD GNA-GNA None       Marylouise Stacks, [place] Paramedic  02/14/22

## 2022-02-14 NOTE — Telephone Encounter (Signed)
Pt called and stated that he would like an RX for Fostex topical cream for his eczema / he stated he used to use this for it and it worked well/please advise asap   [employer] [phone] - [place], [place] AT [place]

## 2022-02-14 NOTE — Telephone Encounter (Signed)
Pt called in to follow up on Rx being sent to [facility]. Advised of current status.

## 2022-02-15 ENCOUNTER — Telehealth (HOSPITAL_COMMUNITY): Payer: Self-pay | Admitting: Licensed Clinical Social Worker

## 2022-02-15 NOTE — Telephone Encounter (Signed)
Pt will need to wait till upcoming appt for new medication, the medication Fostex that pt is requesting is for acne, not treatment of eczema as the pt stated he has.

## 2022-02-15 NOTE — Progress Notes (Signed)
Heart and Vascular Care Navigation  02/15/2022  JAXSEN BERNHART 1971/02/28 [id number]  Reason for Referral: inability to afford basic needs Patient is participating in a Managed Medicaid Plan:Yes  Engaged with patient by telephone for initial visit for Heart and Vascular Care Coordination.                                                                                                   Assessment:        Pt requesting an air mattress as he continues to sleep outside.  CSW able to get camping mattress - will have it brought to him next week.  Pt also needing more incontinence supplies.  CSW helped apply through [employer] for incontinence supplies- they will await script from pt PCP office then will send out supplies to pt step moms house.                                HRT/VAS Care Coordination     Outpatient Care Team [facility]; Social Worker   Clinical biochemist Name: Marylouise Stacks- [phone]   Social Worker Name: Tammy Sours, [facility], [phone]   Living arrangements for the past 2 months Homeless   Lives with: Self   Patient Current Insurance Coverage Medicaid   Home Assistive Devices/Equipment None   DME [place]; [place]       Social History:                                                                             SDOH Screenings   Alcohol Screen: Medium Risk (09/19/2021)   Alcohol Screen    Last Alcohol Screening Score (AUDIT): 16  Depression (PHQ2-9): Low Risk  (01/24/2022)   Depression (PHQ2-9)    PHQ-2 Score: 0  Financial Resource Strain: High Risk (02/15/2022)   Overall Financial Resource Strain (CARDIA)    Difficulty of Paying Living Expenses: Very hard  Food Insecurity: No Food Insecurity (09/19/2021)   Hunger Vital Sign    Worried About Running Out of Food in the Last Year: Never true    Ran Out of Food in the Last Year: Never true  Housing: High Risk (01/01/2022)   Housing     Last Housing Risk Score: 3  Physical Activity: Not on file  Social Connections: Not on file  Stress: Not on file  Tobacco Use: High Risk (02/06/2022)   Patient History    Smoking Tobacco Use: Every Day    Smokeless Tobacco Use: Never    Passive Exposure: Current  Transportation Needs: Unmet Transportation Needs (01/31/2022)   PRAPARE - Hydrologist (Medical): Yes    Lack of Transportation (Non-Medical):  Yes    SDOH Interventions: Financial Resources:  Financial Strain Interventions: Other (Comment) (patient care fund for camping mattress)    Food Insecurity:   N/assessed  Housing Insecurity:   Remains homeless- working with [facility] for housing  Transportation:    Provided bus passes this week     Follow-up plan:     Will continue to follow and assist as needed  Jorge Ny, [facility] Desk#: [phone] Cell#: [phone]

## 2022-02-16 ENCOUNTER — Ambulatory Visit: Payer: Medicaid Other

## 2022-02-16 NOTE — Telephone Encounter (Signed)
Pt called in to follow up. Advised of note below from [place]. Pt expressed understanding. Pt says that the ointment that provider prescribed doesn't help his itch. Pt would like to know if possible could provider send in something different?   Please advise.

## 2022-02-18 NOTE — Progress Notes (Signed)
Patient ID: MADDIX HEINZ, male    DOB: [birth year]  MRN: [id number]  CC: Annual Physical Exam   Subjective: Craig Schmidt is a 51 y.o. male who presents for annual physical exam.   His concerns today include:  - Reports mood swings and history of PTSD. Primarily related to his living situation/being homeless. Denies thoughts of self-harm, suicidal ideations, and homicidal ideations.  - Patient made aware that [employer] was contacted/paperwork sent in regarding his adult briefs. - Patient provided with contact information to [facility] for diabetic eye exam. - Reports erectile dysfunction. Able to get an erection but unable to maintain. Patient made aware he will need to discuss in detail with his cardiologist for safety measures. Patient verbalized understanding.  - Eczema persisting. Requesting a stronger medication. Reports had Fostex in the past and helped.    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   Tear of MCL (medial collateral ligament) of knee 05/20/2015   Protein-calorie malnutrition, severe 05/17/2015   Lactic acidosis 05/15/2015   Left knee pain 05/15/2015   Lung nodules 05/15/2015   Hypoglycemia 05/14/2015   Hypothermia 05/14/2015   Acute encephalopathy 05/14/2015   Cocaine abuse with intoxication ([place]) 05/14/2015   Alcohol intoxication in active alcoholic ([place]) 85/46/2703   Sepsis ([place]) 05/14/2015   Homeless 05/14/2015     Current Outpatient Medications on File Prior to Visit  Medication Sig Dispense Refill   apixaban (ELIQUIS) 5 MG TABS tablet  Take 1 tablet (5 mg total) by mouth 2 (two) times daily. 60 tablet 5   atorvastatin (LIPITOR) 40 MG tablet Take 1 tablet (40 mg total) by mouth daily. 30 tablet 3   carvedilol (COREG) 3.125 MG tablet Take 1 tablet (3.125 mg total) by mouth 2 (two) times daily. 60 tablet 3   clopidogrel (PLAVIX) 75 MG tablet Take 1 tablet (75 mg total) by mouth daily. 30 tablet 6   digoxin (LANOXIN) 0.125 MG tablet Take 1 tablet (0.125 mg total) by mouth daily. 30 tablet 6   empagliflozin (JARDIANCE) 10 MG TABS tablet Take 1 tablet (10 mg total) by mouth daily. 30 tablet 6   famotidine (PEPCID) 20 MG tablet Take 1 tablet (20 mg total) by mouth daily. 30 tablet 6   Glycerin-Hypromellose-PEG 400 0.2-0.2-1 % SOLN Place 1 drop into both eyes as needed. (Patient taking differently: Place 1 drop into both eyes as needed (itchy, redness).) 15 mL 0   isosorbide-hydrALAZINE (BIDIL) 20-37.5 MG tablet Take 1 tablet by mouth 3 (three) times daily. 90 tablet 6   ivabradine (CORLANOR) 5 MG TABS tablet Take 1 tablet (5 mg total) by mouth 2 (two) times daily with a meal. 60 tablet 6   sacubitril-valsartan (ENTRESTO) 24-26 MG Take 1 tablet by mouth 2 (two) times daily. 60 tablet 5   triamcinolone ointment (KENALOG) 0.5 % Apply 1 Application topically 2 (two) times daily. 60 g 2   No current facility-administered medications on file prior to visit.    Allergies  Allergen Reactions   Shellfish Allergy Anaphylaxis   Peanut-Containing Drug Products Itching  Social History   Socioeconomic History   Marital status: Significant Other    Spouse name: Not on file   Number of children: 7   Years of education: Not on file   Highest education level: 9th grade  Occupational History   Occupation: unemployed    Comment: experiencing homelessness-living at daily rent motels.  Tobacco Use   Smoking status: Every Day    Packs/day: 1.00    Years: 41.00    Total pack years: 41.00    Types: Cigarettes    Passive exposure: Current    Smokeless tobacco: Never  Vaping Use   Vaping Use: Never used  Substance and Sexual Activity   Alcohol use: Yes    Alcohol/week: 63.0 standard drinks of alcohol    Types: 63 Cans of beer per week    Comment: 3-40oz beer/day x10 years. 1-2 40oz beer/day in 20s/30s   Drug use: Yes    Types: "Crack" cocaine, Cocaine    Comment: last use 01/13/22   Sexual activity: Not on file  Other Topics Concern   Not on file  Social History Narrative   Not on file   Social Determinants of Health   Financial Resource Strain: High Risk (02/21/2022)   Overall Financial Resource Strain (CARDIA)    Difficulty of Paying Living Expenses: Very hard  Food Insecurity: No Food Insecurity (09/19/2021)   Hunger Vital Sign    Worried About Running Out of Food in the Last Year: Never true    Ran Out of Food in the Last Year: Never true  Transportation Needs: Unmet Transportation Needs (01/31/2022)   PRAPARE - Hydrologist (Medical): Yes    Lack of Transportation (Non-Medical): Yes  Physical Activity: Not on file  Stress: Not on file  Social Connections: Not on file  Intimate Partner Violence: Not on file    No family history on file.  Past Surgical History:  Procedure Laterality Date   BUBBLE STUDY  09/11/2021   Procedure: BUBBLE STUDY;  Surgeon: Fay Records, MD;  Location: [facility];  Service: Cardiovascular;;   LEFT HEART CATH AND CORONARY ANGIOGRAPHY N/A 12/19/2021   Procedure: LEFT HEART CATH AND CORONARY ANGIOGRAPHY;  Surgeon: Belva Crome, MD;  Location: [facility] CV LAB;  Service: Cardiovascular;  Laterality: N/A;   TEE WITHOUT CARDIOVERSION N/A 09/11/2021   Procedure: TRANSESOPHAGEAL ECHOCARDIOGRAM (TEE);  Surgeon: Fay Records, MD;  Location: [facility] ENDOSCOPY;  Service: Cardiovascular;  Laterality: N/A;    ROS: Review of Systems Negative except as stated above  PHYSICAL EXAM: BP 96/60 (BP Location: Left Arm, Patient Position: Sitting, Cuff Size: Normal)    Pulse (!) 50   Temp 98.3 F (36.8 C)   Resp 16   Ht 5' 6.5" (1.689 m)   Wt 155 lb (70.3 kg)   SpO2 94%   BMI 24.65 kg/m   Physical Exam HENT:     Head: Normocephalic and atraumatic.     Right Ear: Tympanic membrane, ear canal and external ear normal.     Left Ear: Tympanic membrane, ear canal and external ear normal.     Nose: Nose normal.     Mouth/Throat:     Mouth: Mucous membranes are moist.     Pharynx: Oropharynx is clear.  Eyes:     Extraocular Movements: Extraocular movements intact.     Conjunctiva/sclera: Conjunctivae normal.     Pupils: Pupils are equal, round, and reactive to light.  Cardiovascular:     Rate  and Rhythm: Bradycardia present.     Pulses: Normal pulses.     Heart sounds: Normal heart sounds.  Pulmonary:     Effort: Pulmonary effort is normal.     Breath sounds: Normal breath sounds.  Abdominal:     General: Bowel sounds are normal.     Palpations: Abdomen is soft.  Genitourinary:    Comments: Patient declined.  Musculoskeletal:        General: Normal range of motion.     Right shoulder: Decreased strength.     Left shoulder: Normal.     Left upper arm: Normal.     Left elbow: Normal.     Left forearm: Normal.     Left wrist: Normal.     Right hand: Decreased strength.     Left hand: Normal.     Cervical back: Normal, normal range of motion and neck supple.     Thoracic back: Normal.     Lumbar back: Normal.     Right hip: Decreased strength.     Left hip: Normal.     Left upper leg: Normal.     Left knee: Normal.     Left lower leg: Normal.     Left ankle: Normal.     Left foot: Normal.     Comments: RUE and RLE decreased strength 4/5.  Skin:    General: Skin is warm and dry.     Capillary Refill: Capillary refill takes less than 2 seconds.  Neurological:     General: No focal deficit present.     Mental Status: He is alert and oriented to person, place, and time.  Psychiatric:        Mood and Affect: Mood normal.         Behavior: Behavior normal.    ASSESSMENT AND PLAN: 1. Annual physical exam - Counseled on 150 minutes of exercise per week as tolerated, healthy eating (including decreased daily intake of saturated fats, cholesterol, added sugars, sodium), STI prevention, and routine healthcare maintenance.  2. Thyroid disorder screen - TSH to check thyroid function.  - TSH  3. Need for hepatitis C screening test - Hepatitis C antibody to screen for hepatitis C.  - Hepatitis C Antibody  4. Colon cancer screening - Referral to Gastroenterology for colon cancer screening by colonoscopy. - Ambulatory referral to Gastroenterology  5. History of posttraumatic stress disorder (PTSD) 6. Mood swings - Patient denies thoughts of self-harm, suicidal ideations, homicidal ideations. - Referral to Psychiatry for further evaluation and management. - Ambulatory referral to Psychiatry  7. Eczema, unspecified type - Referral to Dermatology for further evaluation and management.  - Ambulatory referral to Dermatology    Patient was given the opportunity to ask questions.  Patient verbalized understanding of the plan and was able to repeat key elements of the plan. Patient was given clear instructions to go to Emergency Department or return to medical center if symptoms don't improve, worsen, or new problems develop.The patient verbalized understanding.   Orders Placed This Encounter  Procedures   TSH   Hepatitis C Antibody   Ambulatory referral to Gastroenterology   Ambulatory referral to Psychiatry   Ambulatory referral to Dermatology     Return in about 1 year (around 02/27/2023) for Physical per patient preference.  [name], NP

## 2022-02-19 ENCOUNTER — Ambulatory Visit: Payer: Medicaid Other

## 2022-02-19 ENCOUNTER — Encounter: Payer: Self-pay | Admitting: *Deleted

## 2022-02-19 NOTE — Congregational Nurse Program (Signed)
  Dept: [phone]   Congregational Nurse Program Note  Date of Encounter: 02/19/2022  Past Medical History: Past Medical History:  Diagnosis Date   Asthma    Brain tumor [facility])    MI (myocardial infarction) ([place])    Stroke [facility])     Encounter Details:  CNP Questionnaire - 02/19/22 1337       Questionnaire   Do you give verbal consent to treat you today? Yes    Location Patient Served  [facility]    Visit Setting Church or Organization;Phone/Text/Email    Patient Status Homeless    Insurance [employer] Referral N/A    Medication N/A    Medical Provider Yes    Screening Referrals N/A    Medical Referral N/A    Medical Appointment Made N/A    Food N/A    Transportation Need transportation assistance;Provided transportation assistance    Housing/Utilities No permanent housing    Interpersonal Safety N/A    Intervention [place];Support    ED Visit Averted N/A    Life-Saving Intervention Made N/A           Client came to nurses office with referral from [facility] CM needing help with transportation to multiple appointments. Client has medicaid and multiple appointments with [facility]. Contacted clients medicaid transportation provider [facility]. Set up transportation with [facility] [phone] for transportation to [place] [facility] for 02/21/22 8:30 appt and Primary Care at [facility] 02/26/22 2:00 appt Client is to call [phone] for return rides. Scheduled Standing Order appt with [facility] [phone] for [place] for 02/23/22 10:15 appt, 02/27/22 9:30 appt, and 03/02/22 10:15 appt. Writer was told by [facility] additional appointments would need to be scheduled by fax from facility where client is being seen. Fax number for [employer] [facility] is [phone]. [place] and spoke with [place]. Additional transportation request can be set up once client has been established with PT. [place] at [facility] per client request to cancel 8/4 appt. Client has another appt the same day at 10:15. He reports an established PCP with Johnnye Lana NP and has an appt with her on 02/26/22. Corrina Steffensen W RN CN

## 2022-02-19 NOTE — Telephone Encounter (Signed)
Per provider pt would need to wait till appt for prescription

## 2022-02-20 ENCOUNTER — Telehealth: Payer: Self-pay | Admitting: Family

## 2022-02-20 NOTE — Telephone Encounter (Signed)
Copied from [place]. Topic: General - Other >> Feb 20, 2022  2:11 PM [name] wrote: Reason for CRM: Patient called in states social worker sent in info last week  on assisting him with pull ups/he said referral. I don't show any notes on this. Please call back

## 2022-02-21 ENCOUNTER — Encounter (HOSPITAL_COMMUNITY): Payer: Medicaid Other

## 2022-02-21 ENCOUNTER — Telehealth (HOSPITAL_COMMUNITY): Payer: Self-pay | Admitting: Licensed Clinical Social Worker

## 2022-02-21 ENCOUNTER — Ambulatory Visit: Payer: Medicaid Other

## 2022-02-21 NOTE — Telephone Encounter (Signed)
H&V Care Navigation CSW Progress Note  Clinical Social Worker informed that phone service ending on 8/5.  Pt still without income so was able to add 30 days of service so he can remain in contact with clinic- service now ending Sept 5th- pt informed.  Patient is participating in a Managed Medicaid Plan:  Yes  SDOH Screenings   Alcohol Screen: Medium Risk (09/19/2021)   Alcohol Screen    Last Alcohol Screening Score (AUDIT): 16  Depression (PHQ2-9): Low Risk  (01/24/2022)   Depression (PHQ2-9)    PHQ-2 Score: 0  Financial Resource Strain: High Risk (02/21/2022)   Overall Financial Resource Strain (CARDIA)    Difficulty of Paying Living Expenses: Very hard  Food Insecurity: No Food Insecurity (09/19/2021)   Hunger Vital Sign    Worried About Running Out of Food in the Last Year: Never true    Ran Out of Food in the Last Year: Never true  Housing: High Risk (01/01/2022)   Housing    Last Housing Risk Score: 3  Physical Activity: Not on file  Social Connections: Not on file  Stress: Not on file  Tobacco Use: High Risk (02/06/2022)   Patient History    Smoking Tobacco Use: Every Day    Smokeless Tobacco Use: Never    Passive Exposure: Current  Transportation Needs: Unmet Transportation Needs (01/31/2022)   PRAPARE - Hydrologist (Medical): Yes    Lack of Transportation (Non-Medical): Yes

## 2022-02-21 NOTE — Telephone Encounter (Signed)
Progress note addendum completed to confirm size of briefs, forms faxed to [employer] for Chux and Large adult pull-up underwear

## 2022-02-22 NOTE — Telephone Encounter (Signed)
Pt states that [employer] sent something back to the office and needs confirmation so they can delivery the pts supplies within 6 days / please advise asap

## 2022-02-23 ENCOUNTER — Ambulatory Visit: Payer: Medicaid Other | Admitting: Family Medicine

## 2022-02-23 ENCOUNTER — Ambulatory Visit: Payer: Medicaid Other | Attending: Family | Admitting: Physical Therapy

## 2022-02-23 VITALS — BP 147/83 | HR 66

## 2022-02-23 DIAGNOSIS — M6281 Muscle weakness (generalized): Secondary | ICD-10-CM | POA: Insufficient documentation

## 2022-02-23 DIAGNOSIS — I69351 Hemiplegia and hemiparesis following cerebral infarction affecting right dominant side: Secondary | ICD-10-CM | POA: Diagnosis present

## 2022-02-23 DIAGNOSIS — R2681 Unsteadiness on feet: Secondary | ICD-10-CM | POA: Insufficient documentation

## 2022-02-23 DIAGNOSIS — R262 Difficulty in walking, not elsewhere classified: Secondary | ICD-10-CM | POA: Insufficient documentation

## 2022-02-23 DIAGNOSIS — R293 Abnormal posture: Secondary | ICD-10-CM | POA: Insufficient documentation

## 2022-02-23 NOTE — Therapy (Signed)
OUTPATIENT PHYSICAL THERAPY NEURO TREATMENT   Patient Name: Craig Schmidt MRN: [id number] DOB:10/03/1970, 51 y.o., male Today's Date: 02/23/2022   PCP: [name], NP REFERRING PROVIDER: [name], NP   PT End of Session - 02/23/22 1020     Visit Number 2    Number of Visits 9   Plus eval   Date for PT Re-Evaluation 03/06/22    Authorization Type [place] medicaid    PT Start Time 1018    PT Stop Time 1057    PT Time Calculation (min) 39 min    Activity Tolerance Patient tolerated treatment well    Behavior During Therapy WFL for tasks assessed/performed              Past Medical History:  Diagnosis Date   Asthma    Brain tumor ([place])    MI (myocardial infarction) ([place])    Stroke [facility])    Past Surgical History:  Procedure Laterality Date   BUBBLE STUDY  09/11/2021   Procedure: BUBBLE STUDY;  Surgeon: Fay Records, MD;  Location: [place];  Service: Cardiovascular;;   LEFT HEART CATH AND CORONARY ANGIOGRAPHY N/A 12/19/2021   Procedure: LEFT HEART CATH AND CORONARY ANGIOGRAPHY;  Surgeon: Belva Crome, MD;  Location: [place] CV LAB;  Service: Cardiovascular;  Laterality: N/A;   TEE WITHOUT CARDIOVERSION N/A 09/11/2021   Procedure: TRANSESOPHAGEAL ECHOCARDIOGRAM (TEE);  Surgeon: Fay Records, MD;  Location: [facility] ENDOSCOPY;  Service: Cardiovascular;  Laterality: N/A;   Patient Active Problem List   Diagnosis Date Noted   Chronic combined systolic and diastolic heart failure (HCC)    ST elevation myocardial infarction (STEMI) of inferolateral wall, subsequent episode of care ([place]) 12/19/2021   ST elevation myocardial infarction (STEMI) ([place])    Ischemic cardiomyopathy    Cocaine abuse ([place])    Smoker    Alcohol abuse    Acute ischemic stroke ([place]) 09/08/2021   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[place]) 05/20/2015   Tear of MCL (medial collateral ligament) of knee 05/20/2015   Protein-calorie malnutrition, severe 05/17/2015   Lactic acidosis 05/15/2015   Left  knee pain 05/15/2015   Lung nodules 05/15/2015   Hypoglycemia 05/14/2015   Hypothermia 05/14/2015   Acute encephalopathy 05/14/2015   Cocaine abuse with intoxication ([place]) 05/14/2015   Alcohol intoxication in active alcoholic ([place]) 79/39/0300   Sepsis ([place]) 05/14/2015   Homeless 05/14/2015    ONSET DATE: referral 01/24/22  REFERRING DIAG: P23.30 (ICD-10-CM) - History of CVA (cerebrovascular accident)  THERAPY DIAG:  Difficulty in walking, not elsewhere classified  Unsteadiness on feet  Muscle weakness (generalized)  Rationale for Evaluation and Treatment Rehabilitation  SUBJECTIVE:  SUBJECTIVE STATEMENT: Patient reports doing okay. Obtained an air mattress but had to move locations due to "some things I did not want to be involved with". Currently residing at a truck stop around the corner from the clinic. Inquiring about a sling for his RUE. Pt denies falls but reports a few near misses due to RLE catching on ground.   Pt accompanied by: self  PERTINENT HISTORY: asthma, previous substance abuse, CHF, recent MI   PAIN:  Are you having pain? No  VITALS Vitals:   02/23/22 1035  BP: (!) 147/83  Pulse: 66     PRECAUTIONS: Fall  WEIGHT BEARING RESTRICTIONS No  FALLS: Has patient fallen in last 6 months? No  LIVING ENVIRONMENT: Lives with: is homeless Has following equipment at home: Single point cane and Wheelchair (manual)  PLOF: Independent  PATIENT GOALS  "to get my hand better"  OBJECTIVE:   COGNITION: Overall cognitive status: Within functional limits for tasks assessed   TODAY'S TREATMENT:   Self-care/home management  Discussed pt's current living situation and updates since his eval. Pt reports he now has air mattress but is now residing at a truck stop with a few  friends. Pt asking about an arm sling for his RUE because it is "annoying" and painful. Inquired if pt had a blanket or towel that he could use as a makeshift sling until OT eval, but pt does not have items. Pt inquiring about future appointments, provided handout of all appointments in August and September and reinforced his next appointments x4. Pt able to teach back appointment days and times at end of session.   Ther Ex  Initiated and practiced initial HEP for improved BLE strength and endurance. At this time, provided only supine exercises as this is the only safe position for pt to perform (on air mattress):  - Supine Bridge, 2x10 reps w/min cues to maintain breathing throughout  -Supine March, x10 reps per side  - Hooklying Clamshell with Resistance (red band), 2x10 reps w/2-3s isometric hold    GAIT: Gait pattern: step through pattern, decreased arm swing- Right, decreased stride length, knee flexed in stance- Right, trunk flexed, wide BOS, poor foot clearance- Right, and poor foot clearance- Left Distance walked: clinic  Assistive device utilized: None Level of assistance: Complete Independence    PATIENT EDUCATION: Education details: Initial HEP, provided handout of future appointments  Person educated: Patient Education method: Explanation and Handouts Education comprehension: verbalized understanding   HOME EXERCISE PROGRAM: Access Code: [email or web address] URL: [email or web address] Date: 02/23/2022 Prepared by: Mickie Bail Antjuan Rothe  Exercises - Supine Bridge  - 1 x daily - 7 x weekly - 3 sets - 10 reps - March  - 1 x daily - 7 x weekly - 3 sets - 10 reps - Hooklying Clamshell with Resistance  - 1 x daily - 7 x weekly - 3 sets - 10 reps    GOALS: Goals reviewed with patient? Yes  SHORT TERM GOALS:  = LTG based on POC length   LONG TERM GOALS: Target date: 03/06/2022  Pt will be independent with final HEP for improved balance and functional strength   Baseline:  to be provided Goal status: INITIAL  2.  Pt will improve FGA to >/= 28/30 to demonstrate improved balance and reduced fall risk  Baseline: 23/30 Goal status: INITIAL  3.  Pt will improve gait speed to >/= .72ms  to demonstrate improved community ambulation  Baseline: .351m Goal status: INITIAL   ASSESSMENT:  CLINICAL IMPRESSION: Emphasis of  skilled PT session on establishing initial HEP. Pt reports he did obtain air mattress and can perform exercises on it. Pt asking for arm sling, deferred to OT as pt has OT eval next week. Pt limited by poor endurance and impaired cardiovascular response to exercise, will continue to monitor BP. Continue POC.   OBJECTIVE IMPAIRMENTS Abnormal gait, cardiopulmonary status limiting activity, decreased activity tolerance, decreased balance, decreased endurance, decreased knowledge of condition, decreased mobility, difficulty walking, decreased strength, decreased safety awareness, impaired tone, improper body mechanics, and postural dysfunction.   ACTIVITY LIMITATIONS carrying, lifting, bending, squatting, stairs, locomotion level, and caring for others  PARTICIPATION LIMITATIONS: interpersonal relationship, driving, shopping, community activity, and yard work  PERSONAL FACTORS Behavior pattern, Education, Past/current experiences, Social background, Time since onset of injury/illness/exacerbation, Transportation, and 3+ comorbidities: substance abuse, homelessness, MI x2  are also affecting patient's functional outcome.   REHAB POTENTIAL: Fair time since onset  CLINICAL DECISION MAKING: Stable/uncomplicated  EVALUATION COMPLEXITY: Low  PLAN: PT FREQUENCY: 2x/week  PT DURATION: 4 weeks  PLANNED INTERVENTIONS: Therapeutic exercises, Therapeutic activity, Neuromuscular re-education, Balance training, Gait training, Patient/Family education, Self Care, Joint mobilization, Stair training, Vestibular training, Canalith repositioning, Visual/preceptual  remediation/compensation, Orthotic/Fit training, DME instructions, Aquatic Therapy, Cognitive remediation, Electrical stimulation, Manual therapy, and Re-evaluation  PLAN FOR NEXT SESSION: add to HEP in supine, postural work, assess MTCSIB  Managed medicaid CPT codes: [phone] - PT Re-evaluation, 97110- Therapeutic Exercise, [id number]- Neuro Re-education, [phone] - Gait Training, [id number] - Manual Therapy, [id number] - Therapeutic Activities, [id number] - [place], [id number] - Electrical stimulation (unattended), [id number] - Electrical stimulation (Manual), [place], [id number] - Physical performance training, [id number] - Aquatic therapy, and [id number] - Canalith Repositioning   Jalise Zawistowski E Ayn Domangue, PT, DPT 02/23/2022, 10:58 AM

## 2022-02-26 ENCOUNTER — Ambulatory Visit (INDEPENDENT_AMBULATORY_CARE_PROVIDER_SITE_OTHER): Payer: Medicaid Other | Admitting: Family

## 2022-02-26 ENCOUNTER — Encounter: Payer: Self-pay | Admitting: Family

## 2022-02-26 VITALS — BP 96/60 | HR 50 | Temp 98.3°F | Resp 16 | Ht 66.5 in | Wt 155.0 lb

## 2022-02-26 DIAGNOSIS — L309 Dermatitis, unspecified: Secondary | ICD-10-CM | POA: Diagnosis not present

## 2022-02-26 DIAGNOSIS — R4586 Emotional lability: Secondary | ICD-10-CM | POA: Diagnosis not present

## 2022-02-26 DIAGNOSIS — Z1329 Encounter for screening for other suspected endocrine disorder: Secondary | ICD-10-CM | POA: Diagnosis not present

## 2022-02-26 DIAGNOSIS — Z1159 Encounter for screening for other viral diseases: Secondary | ICD-10-CM

## 2022-02-26 DIAGNOSIS — Z1211 Encounter for screening for malignant neoplasm of colon: Secondary | ICD-10-CM

## 2022-02-26 DIAGNOSIS — Z0000 Encounter for general adult medical examination without abnormal findings: Secondary | ICD-10-CM

## 2022-02-26 DIAGNOSIS — Z0001 Encounter for general adult medical examination with abnormal findings: Secondary | ICD-10-CM

## 2022-02-26 DIAGNOSIS — Z5901 Sheltered homelessness: Secondary | ICD-10-CM

## 2022-02-26 DIAGNOSIS — Z8659 Personal history of other mental and behavioral disorders: Secondary | ICD-10-CM | POA: Diagnosis not present

## 2022-02-26 DIAGNOSIS — F4312 Post-traumatic stress disorder, chronic: Secondary | ICD-10-CM

## 2022-02-26 NOTE — Therapy (Signed)
OUTPATIENT OCCUPATIONAL THERAPY NEURO EVALUATION  Patient Name: Craig Schmidt MRN: [id number] DOB:21-Oct-1970, 51 y.o., male Today's Date: 02/27/2022  PCP: [name], NP REFERRING PROVIDER: [name], NP    OT End of Session - 02/27/22 1215     Visit Number 1    Number of Visits 13    Date for OT Re-Evaluation 04/19/22    Authorization Type Medicaid [employer] (no e-stim, only 1 tx diagnosis)--awaiting auth    OT Start Time 1018    OT Stop Time 1100    OT Time Calculation (min) 42 min    Activity Tolerance Patient tolerated treatment well    Behavior During Therapy WFL for tasks assessed/performed             Past Medical History:  Diagnosis Date   Asthma    Brain tumor ([place])    MI (myocardial infarction) ([place])    Stroke [facility])    Past Surgical History:  Procedure Laterality Date   BUBBLE STUDY  09/11/2021   Procedure: BUBBLE STUDY;  Surgeon: Fay Records, MD;  Location: [place];  Service: Cardiovascular;;   LEFT HEART CATH AND CORONARY ANGIOGRAPHY N/A 12/19/2021   Procedure: LEFT HEART CATH AND CORONARY ANGIOGRAPHY;  Surgeon: Belva Crome, MD;  Location: [place] CV LAB;  Service: Cardiovascular;  Laterality: N/A;   TEE WITHOUT CARDIOVERSION N/A 09/11/2021   Procedure: TRANSESOPHAGEAL ECHOCARDIOGRAM (TEE);  Surgeon: Fay Records, MD;  Location: [facility] ENDOSCOPY;  Service: Cardiovascular;  Laterality: N/A;   Patient Active Problem List   Diagnosis Date Noted   Chronic combined systolic and diastolic heart failure (HCC)    ST elevation myocardial infarction (STEMI) of inferolateral wall, subsequent episode of care ([place]) 12/19/2021   ST elevation myocardial infarction (STEMI) ([place])    Ischemic cardiomyopathy    Cocaine abuse ([place])    Smoker    Alcohol abuse    Acute ischemic stroke ([place]) 09/08/2021   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[place]) 05/20/2015   Tear of MCL (medial collateral ligament) of knee 05/20/2015   Protein-calorie malnutrition,  severe 05/17/2015   Lactic acidosis 05/15/2015   Left knee pain 05/15/2015   Lung nodules 05/15/2015   Hypoglycemia 05/14/2015   Hypothermia 05/14/2015   Acute encephalopathy 05/14/2015   Cocaine abuse with intoxication ([place]) 05/14/2015   Alcohol intoxication in active alcoholic ([place]) 27/12/2374   Sepsis ([place]) 05/14/2015   Homeless 05/14/2015    ONSET DATE: 12/19/21  REFERRING DIAG: E83.151 (ICD-10-CM) - RUE weakness   THERAPY DIAG:  Hemiplegia and hemiparesis following cerebral infarction affecting right dominant side ([place])  Rationale for Evaluation and Treatment Rehabilitation  SUBJECTIVE:   SUBJECTIVE STATEMENT: Pt reports that he is not able to use RUE functionally  Pt accompanied by: self  PERTINENT HISTORY: 51 y.o. male presented to [facility] hospital on 12/19/2021 with chest pain. EKG with ST elevation in ED. Pt underwent cardiac cath on 5/30, demonstrating occlusion of apical LAD, severe systolic dysfunction with EF less than 25%. Pt refusing right heart cath 6/2.   PMH includes:  CVA March 2023 with multiple infarcts, hx of substance abuse, systolic heart failure, mitral stenosis, L vestibular schwannoma.  PRECAUTIONS: Fall  WEIGHT BEARING RESTRICTIONS No  PAIN:  Are you having pain? Yes: NPRS scale: 2/10 Pain location: R shoulder Pain description: sharp, numb Aggravating factors: malpositioning Relieving factors: improved positions  FALLS: Has patient fallen in last 6 months? Yes. 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, now unable, and Leisure: football, basketball, jog, lift weights  PATIENT GOALS   get my arm and hand back  OBJECTIVE:   HAND DOMINANCE:  Right  ADLs:  Eating: eating with LUE Grooming: with LUE  UB Dressing: mod I with difficulty/incr time, gets assist at times LB Dressing: mod I with difficulty/incr time, gets assist at times Toileting: mod I Bathing: using wipes when able to clean up   IADLs: Light housekeeping: not performing due to living situation Meal Prep:  not performing due to living situation Community mobility: unable Medication management: mod I Handwriting: unable per pt report  MOBILITY STATUS: Hx of falls and ambulates with hemi-walker for longer distances or uses w/c in the community--see PT eval for details   UPPER EXTREMITY AROM:  RUE held in flexor synergy pattern however, pt able to demo approx 80* shoulder flex and abduction with IR/compensation, full elbow ext, approx 75% supination/pronation and wrist ext, gross finger flex/ext WNL, but unable to oppose to 2nd digit or perform individual finger movements.   UPPER EXTREMITY MMT:  Not formally tested due to tone, limited ROM, and pain--see ROM above    HAND FUNCTION: Pt able to grasp and release cup with min A (stabilized) with multiple attempts when positioned close to cup to limit reach.  Pt able to pick up 1-inch block with gross grasp (not pinch) with mod-significant difficulty  COORDINATION: Box and Blocks:  Right 3blocks, Left 30blocks  SENSATION: Impaired for light touch, temperature discrimination per pt  MUSCLE TONE: RUE: Mild  COGNITION: Overall cognitive status: Impaired and pt reports STM deficits  VISION: Subjective report: pt reports blurriness in both eyes, black spots in R eye vision, diplopia (particularly with quick head/eye movements like when crossing street) and bumping into things.  Pt reports that he has been referred to eye doctor by PCP  VISION ASSESSMENT: Impaired To be further assessed in functional context   TODAY'S TREATMENT:  Eval completed, also See pt education   PATIENT EDUCATION: Education  details: Proper positioning of RUE in sitting and bed (avoiding IR), pt reports that he has a Stage manager Sling--recommended pt bring in for possible adjustment/education next visit; initial simple HEP--see pt instructions;  Person educated: Patient Education method: Explanation, Demonstration, Verbal cues, and Handouts Education comprehension: verbalized understanding, returned demonstration, verbal cues required, and needs further education   HOME EXERCISE PROGRAM: 02/27/22:  reach and grasp/release empty bottle and bottle caps    GOALS: Potential Goals reviewed with patient? Yes  SHORT TERM GOALS: Target date: 03/29/22  Pt will be independent with initial HEP for RUE. Baseline:  no HEP Goal status: INITIAL  2.  Pt will verbalize understanding of proper positioning of RUE to decr pain and prevent injury. Baseline:  pt positioned in flexor synergy pattern with pain Goal status: INITIAL  3.  Pt will demo at least 90* R shoulder flex without pain for functional reach. Baseline: 80* with pain Goal status: INITIAL  4.  Pt will report consistently using RUE as a stabilizer for functional tasks. Baseline:  pt not currently using RUE functionally Goal status: INITIAL  5.  Pt will be independent with HEP for vision/vision compensation strategies. Baseline:  pt reports diplopia, no HEP, and concerns regarding crossing street safely Goal status:  INITIAL   LONG TERM GOALS: Target date: 04/19/22  Pt will be independent with updated HEP for RUE. Baseline:  no HEP Goal status: INITIAL  2.  Pt will demo at least 100* R shoulder flex without pain for functional reach. Baseline: 80* with pain Goal status: INITIAL  3.  Pt will use RUE for gross assist for bilateral ADL tasks. Baseline: not using RUE functionally Goal status: INITIAL  4.  Pt will improve RUE coordination/functional reaching as shown by improving score on box and blocks test by at least 3 blocks. Baseline:  3 blocks Goal  status: INITIAL  5.  Pt will perform environmental scanning/navigation in busy environment with at least 90% accuracy for incr safety. Baseline:  pt reports diplopia (particularly when making quick head/eye movements, like when crossing the street) and bumping into items   Goal status: INITIAL   ASSESSMENT:  CLINICAL IMPRESSION: Patient is a 51 y.o. male who was seen today for occupational therapy evaluation for RUE weakness, s/p CVA (multiple infarts) March 2023.  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  Pt now reports difficulty with ADLs/IADLs due to decr RUE functional use.  PMH also includes:  MI 12/19/21,  hx of substance abuse, systolic heart failure, mitral stenosis, L vestibular schwannoma.  Pt presents today with R hemiparesis with decr ROM, decr strength, decr coordination, spasticity, pain, decr balance, visual, and cognitive deficits.  Pt would benefit from occupational therapy to incr dominant RUE functional use and incr ease and safety with ADLs/IADLs.  PERFORMANCE DEFICITS in functional skills including ADLs, IADLs, coordination, dexterity, sensation, tone, ROM, pain, FMC, GMC, mobility, balance, decreased knowledge of use of DME, vision, and UE functional use, cognitive skills including memory and safety awareness, and psychosocial skills including environmental adaptation and routines and behaviors.   IMPAIRMENTS are limiting patient from ADLs, IADLs, work, and leisure.   COMORBIDITIES may have co-morbidities  that affects occupational performance. Patient will benefit from skilled OT to address above impairments and improve overall function.  MODIFICATION OR ASSISTANCE TO COMPLETE EVALUATION: Min-Moderate modification of tasks or assist with assess necessary to complete an evaluation.  OT OCCUPATIONAL PROFILE AND HISTORY: Detailed assessment: Review of records and additional review of physical,  cognitive, psychosocial history related to current functional performance.  CLINICAL DECISION MAKING: Moderate - several treatment options, min-mod task modification necessary  REHAB POTENTIAL: Good  EVALUATION COMPLEXITY: Moderate    PLAN: OT FREQUENCY: 2x/week  OT DURATION: 6 weeks +eval  PLANNED INTERVENTIONS: self care/ADL training, therapeutic exercise, therapeutic activity, neuromuscular re-education, manual therapy, passive range of motion, balance training, functional mobility training, splinting, ultrasound, paraffin, fluidotherapy, moist heat, cryotherapy, patient/family education, cognitive remediation/compensation, visual/perceptual remediation/compensation, and DME and/or AE instructions  RECOMMENDED OTHER SERVICES: Pt is current with PT and is working with Education officer, museum.  No additional recommendations at this time  CONSULTED AND AGREED WITH PLAN OF CARE: Patient  PLAN FOR NEXT SESSION: check/adjust Giv Mohr sling as appropriate (pt to bring in), initiate supine HEP for R shoulder, continue to address RUE positioning   Managed medicaid CPT codes: 37902- Therapeutic Exercise, [id number]- Neuro Re-education, 97140 - Manual Therapy, 97530 - Therapeutic Activities, [place], [phone] - Ultrasound, [phone] - Orthotic Fit, [id number] - Fluidotherapy, and [id number] -  Paraffin  Functional Outcome measure:  Box & Blocks Test:  3 blocks with RUE    Juniel Groene, OTR/L 02/27/2022, 2:14 PM

## 2022-02-26 NOTE — Progress Notes (Signed)
.  Pt presents for annual physical exam  

## 2022-02-26 NOTE — Patient Instructions (Addendum)
Call [facility] phone # [phone] Address [address]  Preventive Care 49-49 Years Old, Male Preventive care refers to lifestyle choices and visits with your health care provider that can promote health and wellness. Preventive care visits are also called wellness exams. What can I expect for my preventive care visit? Counseling During your preventive care visit, your health care provider may ask about your: Medical history, including: Past medical problems. Family medical history. Current health, including: Emotional well-being. Home life and relationship well-being. Sexual activity. Lifestyle, including: Alcohol, nicotine or tobacco, and drug use. Access to firearms. Diet, exercise, and sleep habits. Safety issues such as seatbelt and bike helmet use. Sunscreen use. Work and work Statistician. Physical exam Your health care provider will check your: Height and weight. These may be used to calculate your BMI (body mass index). BMI is a measurement that tells if you are at a healthy weight. Waist circumference. This measures the distance around your waistline. This measurement also tells if you are at a healthy weight and may help predict your risk of certain diseases, such as type 2 diabetes and high blood pressure. Heart rate and blood pressure. Body temperature. Skin for abnormal spots. What immunizations do I need?  Vaccines are usually given at various ages, according to a schedule. Your health care provider will recommend vaccines for you based on your age, medical history, and lifestyle or other factors, such as travel or where you work. What tests do I need? Screening Your health care provider may recommend screening tests for certain conditions. This may include: Lipid and cholesterol levels. Diabetes screening. This is done by checking your blood sugar (glucose) after you have not eaten for a while (fasting). Hepatitis B test. Hepatitis C test. HIV  (human immunodeficiency virus) test. STI (sexually transmitted infection) testing, if you are at risk. Lung cancer screening. Prostate cancer screening. Colorectal cancer screening. Talk with your health care provider about your test results, treatment options, and if necessary, the need for more tests. Follow these instructions at home: Eating and drinking  Eat a diet that includes fresh fruits and vegetables, whole grains, lean protein, and low-fat dairy products. Take vitamin and mineral supplements as recommended by your health care provider. Do not drink alcohol if your health care provider tells you not to drink. If you drink alcohol: Limit how much you have to 0-2 drinks a day. Know how much alcohol is in your drink. In the [place] one drink equals one 12 oz bottle of beer (355 mL), one 5 oz glass of wine (148 mL), or one 1 oz glass of hard liquor (44 mL). Lifestyle Brush your teeth every morning and night with fluoride toothpaste. Floss one time each day. Exercise for at least 30 minutes 5 or more days each week. Do not use any products that contain nicotine or tobacco. These products include cigarettes, chewing tobacco, and vaping devices, such as e-cigarettes. If you need help quitting, ask your health care provider. Do not use drugs. If you are sexually active, practice safe sex. Use a condom or other form of protection to prevent STIs. Take aspirin only as told by your health care provider. Make sure that you understand how much to take and what form to take. Work with your health care provider to find out whether it is safe and beneficial for you to take aspirin daily. Find healthy ways to manage stress, such as: Meditation, yoga, or listening to music. Journaling. Talking to a  trusted person. Spending time with friends and family. Minimize exposure to UV radiation to reduce your risk of skin cancer. Safety Always wear your seat belt while driving or riding in a vehicle. Do  not drive: If you have been drinking alcohol. Do not ride with someone who has been drinking. When you are tired or distracted. While texting. If you have been using any mind-altering substances or drugs. Wear a helmet and other protective equipment during sports activities. If you have firearms in your house, make sure you follow all gun safety procedures. What's next? Go to your health care provider once a year for an annual wellness visit. Ask your health care provider how often you should have your eyes and teeth checked. Stay up to date on all vaccines. This information is not intended to replace advice given to you by your health care provider. Make sure you discuss any questions you have with your health care provider. Document Revised: 01/04/2021 Document Reviewed: 01/04/2021 Elsevier Patient Education  [place].

## 2022-02-27 ENCOUNTER — Telehealth: Payer: Self-pay

## 2022-02-27 ENCOUNTER — Telehealth: Payer: Self-pay | Admitting: Family

## 2022-02-27 ENCOUNTER — Other Ambulatory Visit: Payer: Self-pay | Admitting: Family

## 2022-02-27 ENCOUNTER — Ambulatory Visit: Payer: Medicaid Other

## 2022-02-27 ENCOUNTER — Encounter: Payer: Self-pay | Admitting: Occupational Therapy

## 2022-02-27 ENCOUNTER — Ambulatory Visit: Payer: Medicaid Other | Admitting: Occupational Therapy

## 2022-02-27 DIAGNOSIS — L309 Dermatitis, unspecified: Secondary | ICD-10-CM

## 2022-02-27 DIAGNOSIS — Z9181 History of falling: Secondary | ICD-10-CM

## 2022-02-27 DIAGNOSIS — R278 Other lack of coordination: Secondary | ICD-10-CM

## 2022-02-27 DIAGNOSIS — R2681 Unsteadiness on feet: Secondary | ICD-10-CM

## 2022-02-27 DIAGNOSIS — R32 Unspecified urinary incontinence: Secondary | ICD-10-CM | POA: Diagnosis not present

## 2022-02-27 DIAGNOSIS — R293 Abnormal posture: Secondary | ICD-10-CM

## 2022-02-27 DIAGNOSIS — I69351 Hemiplegia and hemiparesis following cerebral infarction affecting right dominant side: Secondary | ICD-10-CM

## 2022-02-27 DIAGNOSIS — M6281 Muscle weakness (generalized): Secondary | ICD-10-CM

## 2022-02-27 DIAGNOSIS — R262 Difficulty in walking, not elsewhere classified: Secondary | ICD-10-CM

## 2022-02-27 DIAGNOSIS — R29898 Other symptoms and signs involving the musculoskeletal system: Secondary | ICD-10-CM

## 2022-02-27 LAB — HEPATITIS C ANTIBODY: Hep C Virus Ab: NONREACTIVE

## 2022-02-27 LAB — TSH: TSH: 0.696 u[IU]/mL (ref 0.450–4.500)

## 2022-02-27 NOTE — Telephone Encounter (Signed)
error 

## 2022-02-27 NOTE — Telephone Encounter (Signed)
Order complete. 

## 2022-02-27 NOTE — Telephone Encounter (Signed)
[name], NP  Craig Schmidt is being treated by PT currently. The patient would benefit from a SpryStep Plus AFO to assist with minimizing falls due to decreased R LE strength/coordination. If you agree, please place an order in [facility] workque in [facility] or fax the order to [phone].  Thank you, Debbora Dus, PT, DPT, [facility] [address] [place], [place]  [address] Phone:  [phone] Fax:  [phone]

## 2022-02-27 NOTE — Patient Instructions (Signed)
**   Practice picking up bottle caps, empty bottle with right arm/hand   Positioning: Lying on Back    Elevate affected arm: Shoulder is positioned forward, arm out to side. May use folded towel under shoulder blade. Palm is turned upward as able. Position affected leg: Hip and knee slightly bent. Toes pointing up as able. Avoid tight tucking sheets. May use footboard or cradle.   Positioning: Lying on Unaffected Side    Elevate affected arm: Shoulder is positioned forward. Elbow straightened as able. Hand with palm down. Position affected leg: Hip and knee slightly bent. Toes pointing forward. May place another pillow behind back to support trunk position.

## 2022-02-27 NOTE — Therapy (Signed)
OUTPATIENT PHYSICAL THERAPY NEURO TREATMENT   Patient Name: Craig Schmidt MRN: [id number] DOB:03-13-1971, 51 y.o., male Today's Date: 02/27/2022   PCP: [name], NP REFERRING PROVIDER: [name], NP   PT End of Session - 02/27/22 0937     Visit Number 3    Number of Visits 9    Date for PT Re-Evaluation 03/06/22    Authorization Type [place] medicaid    PT Start Time [phone]   patient in bathroom   PT Stop Time 1015    PT Time Calculation (min) 38 min    Activity Tolerance Patient tolerated treatment well    Behavior During Therapy [facility] for tasks assessed/performed              Past Medical History:  Diagnosis Date   Asthma    Brain tumor ([place])    MI (myocardial infarction) ([place])    Stroke [facility])    Past Surgical History:  Procedure Laterality Date   BUBBLE STUDY  09/11/2021   Procedure: BUBBLE STUDY;  Surgeon: Fay Records, MD;  Location: [place];  Service: Cardiovascular;;   LEFT HEART CATH AND CORONARY ANGIOGRAPHY N/A 12/19/2021   Procedure: LEFT HEART CATH AND CORONARY ANGIOGRAPHY;  Surgeon: Belva Crome, MD;  Location: [place] CV LAB;  Service: Cardiovascular;  Laterality: N/A;   TEE WITHOUT CARDIOVERSION N/A 09/11/2021   Procedure: TRANSESOPHAGEAL ECHOCARDIOGRAM (TEE);  Surgeon: Fay Records, MD;  Location: [facility] ENDOSCOPY;  Service: Cardiovascular;  Laterality: N/A;   Patient Active Problem List   Diagnosis Date Noted   Chronic combined systolic and diastolic heart failure (HCC)    ST elevation myocardial infarction (STEMI) of inferolateral wall, subsequent episode of care ([place]) 12/19/2021   ST elevation myocardial infarction (STEMI) ([place])    Ischemic cardiomyopathy    Cocaine abuse ([place])    Smoker    Alcohol abuse    Acute ischemic stroke ([place]) 09/08/2021   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   Tear of MCL (medial collateral ligament) of knee 05/20/2015   Protein-calorie malnutrition, severe 05/17/2015   Lactic acidosis  05/15/2015   Left knee pain 05/15/2015   Lung nodules 05/15/2015   Hypoglycemia 05/14/2015   Hypothermia 05/14/2015   Acute encephalopathy 05/14/2015   Cocaine abuse with intoxication ([place]) 05/14/2015   Alcohol intoxication in active alcoholic ([place]) 24/03/7352   Sepsis ([place]) 05/14/2015   Homeless 05/14/2015    ONSET DATE: referral 01/24/22  REFERRING DIAG: G99.24 (ICD-10-CM) - History of CVA (cerebrovascular accident)  THERAPY DIAG:  Unsteadiness on feet  Difficulty in walking, not elsewhere classified  Muscle weakness (generalized)  Abnormal posture  Rationale for Evaluation and Treatment Rehabilitation  SUBJECTIVE:  SUBJECTIVE STATEMENT: Patient reports doing well. Denies pain. Reports multiple frequent near- falls due to tripping over R foot. On observation, shoes seem a little bit larger than necessary, but he does have residual R LE weakness from his CVA likely contributing to tripping/ gait abnormalities.  Pt accompanied by: self  PERTINENT HISTORY: asthma, previous substance abuse, CHF, recent MI   PAIN:  Are you having pain? No  VITALS There were no vitals filed for this visit.    PRECAUTIONS: Fall  WEIGHT BEARING RESTRICTIONS No  FALLS: Has patient fallen in last 6 months? No  LIVING ENVIRONMENT: Lives with: is homeless Has following equipment at home: Single point cane and Wheelchair (manual)  PLOF: Independent  PATIENT GOALS  "to get my hand better"  OBJECTIVE:   COGNITION: Overall cognitive status: Within functional limits for tasks assessed   TODAY'S TREATMENT:   NMR:   -attempts at finding sling that would support patients R UE during gait (he does have at least a 1 finger subluxation- states that he has a [employer] sling, but that it doesn't work for him)    -standing modified R LE D1/D2 pattern to 4" step with AFO on-> added 2# ankle weight   GAIT: Gait pattern: step through pattern, decreased arm swing- Right, decreased stride length, knee flexed in stance- Right, trunk flexed, wide BOS, poor foot clearance- Right, and poor foot clearance- Left Distance walked: clinic  Assistive device utilized: None Level of assistance: Complete Independence Trialed SpryStep Max and SpryStep Plus (patient much more stable with SpryStep Plus   PATIENT EDUCATION: Education details: attempts to support R UE with gait, AFOs  Person educated: Patient Education method: Explanation and Handouts Education comprehension: verbalized understanding   HOME EXERCISE PROGRAM: Access Code: [email or web address] URL: [email or web address] Date: 02/23/2022 Prepared by: Mickie Bail Plaster  Exercises - Supine Bridge  - 1 x daily - 7 x weekly - 3 sets - 10 reps - March  - 1 x daily - 7 x weekly - 3 sets - 10 reps - Hooklying Clamshell with Resistance  - 1 x daily - 7 x weekly - 3 sets - 10 reps    GOALS: Goals reviewed with patient? Yes  SHORT TERM GOALS:  = LTG based on POC length   LONG TERM GOALS: Target date: 03/06/2022  Pt will be independent with final HEP for improved balance and functional strength   Baseline: to be provided Goal status: INITIAL  2.  Pt will improve FGA to >/= 28/30 to demonstrate improved balance and reduced fall risk  Baseline: 23/30 Goal status: INITIAL  3.  Pt will improve gait speed to >/= .43ms  to demonstrate improved community ambulation  Baseline: .334m Goal status: INITIAL   ASSESSMENT:  CLINICAL IMPRESSION: Patient seen for skilled PT session with emphasis on gait re-training and trialing AFOs. On observation, his shoes seem a little bit larger than necessary, but he does have residual R LE weakness from his CVA likely contributing to tripping/ gait abnormalities. His gait speed and overall mechanics were much  improved using the SpryStep Plus AFO and he would benefit from its use in order to minimize his risk for falling. Continue POC.   OBJECTIVE IMPAIRMENTS Abnormal gait, cardiopulmonary status limiting activity, decreased activity tolerance, decreased balance, decreased endurance, decreased knowledge of condition, decreased mobility, difficulty walking, decreased strength, decreased safety awareness, impaired tone, improper body mechanics, and postural dysfunction.   ACTIVITY LIMITATIONS carrying, lifting, bending, squatting, stairs, locomotion level, and caring for others  PARTICIPATION LIMITATIONS:  interpersonal relationship, driving, shopping, community activity, and yard work  PERSONAL FACTORS Behavior pattern, Education, Past/current experiences, Social background, Time since onset of injury/illness/exacerbation, Transportation, and 3+ comorbidities: substance abuse, homelessness, MI x2  are also affecting patient's functional outcome.   REHAB POTENTIAL: Fair time since onset  CLINICAL DECISION MAKING: Stable/uncomplicated  EVALUATION COMPLEXITY: Low  PLAN: PT FREQUENCY: 2x/week  PT DURATION: 4 weeks  PLANNED INTERVENTIONS: Therapeutic exercises, Therapeutic activity, Neuromuscular re-education, Balance training, Gait training, Patient/Family education, Self Care, Joint mobilization, Stair training, Vestibular training, Canalith repositioning, Visual/preceptual remediation/compensation, Orthotic/Fit training, DME instructions, Aquatic Therapy, Cognitive remediation, Electrical stimulation, Manual therapy, and Re-evaluation  PLAN FOR NEXT SESSION: add to HEP in supine, postural work, assess MTCSIB, keep trialing SpryStep Plus AFO  Managed medicaid CPT codes: [phone] - PT Re-evaluation, 97110- Therapeutic Exercise, [id number]- Neuro Re-education, [id number] - Gait Training, [id number] - Manual Therapy, [id number] - Therapeutic Activities, [place], [id number] - Electrical stimulation (unattended), [id number] -  Electrical stimulation (Manual), [place], [id number] - Physical performance training, [id number] - Aquatic therapy, and [id number] - Canalith Repositioning   Debbora Dus, PT, DPT Debbora Dus, PT, DPT, CBIS  02/27/2022, 10:57 AM

## 2022-02-27 NOTE — Telephone Encounter (Unsigned)
Copied from [place] [id number]. Topic: General - Other >> Feb 27, 2022  2:55 PM Everette C wrote: Reason for CRM: Medication Refill - Medication: triamcinolone ointment (KENALOG) 0.5 % [[id number]]   Has the patient contacted their pharmacy? Yes.  The patient has been directed to contact their PCP (Agent: If no, request that the patient contact the pharmacy for the refill. If patient does not wish to contact the pharmacy document the reason why and proceed with request.) (Agent: If yes, when and what did the pharmacy advise?)  Preferred Pharmacy (with phone number or street name): [facility] [phone] - [address] [place] [address] Phone: [phone] Fax: [phone] Hours: Not open 24 hours   Has the patient been seen for an appointment in the last year OR does the patient have an upcoming appointment? Yes.    Agent: Please be advised that RX refills may take up to 3 business days. We ask that you follow-up with your pharmacy.

## 2022-02-28 NOTE — Telephone Encounter (Signed)
Requested medication (s) are due for refill today:   Provider to review  Requested medication (s) are on the active medication list:   Yes  Future visit scheduled:   No    Had CPE 2 days ago   Last ordered: 01/24/2022 60 g, 2 refills  Non delegated refill reason returned   Requested Prescriptions  Pending Prescriptions Disp Refills   triamcinolone ointment (KENALOG) 0.5 % 60 g 2    Sig: Apply 1 Application topically 2 (two) times daily.     Not Delegated - Dermatology:  Corticosteroids Failed - 02/27/2022  3:20 PM      Failed - This refill cannot be delegated      Passed - Valid encounter within last 12 months    Recent Outpatient Visits           2 days ago Annual physical exam   [facility] Care at [facility], Amy J, NP   1 month ago Encounter to establish care   Primary Care at [facility], Amy J, NP   6 years ago Unilateral vestibular schwannoma [facility])   Ashe, Enobong, MD       Future Appointments             In 1 month Branch, Royetta Crochet, MD [place], [facility]   In 1 month Garvin Fila, MD [facility]

## 2022-03-02 ENCOUNTER — Ambulatory Visit: Payer: Medicaid Other | Admitting: Family

## 2022-03-02 ENCOUNTER — Ambulatory Visit: Payer: Medicaid Other

## 2022-03-02 DIAGNOSIS — M6281 Muscle weakness (generalized): Secondary | ICD-10-CM

## 2022-03-02 DIAGNOSIS — R262 Difficulty in walking, not elsewhere classified: Secondary | ICD-10-CM

## 2022-03-02 DIAGNOSIS — R293 Abnormal posture: Secondary | ICD-10-CM

## 2022-03-02 DIAGNOSIS — R2681 Unsteadiness on feet: Secondary | ICD-10-CM

## 2022-03-02 NOTE — Progress Notes (Unsigned)
Patient ID: Craig Schmidt, male    DOB: 1971-02-11  MRN: [id number]  CC: Hip Discomfort  Subjective: Craig Schmidt is a 51 y.o. male who presents for hip discomfort.   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   Tear of MCL (medial collateral ligament) of knee 05/20/2015   Protein-calorie malnutrition, severe 05/17/2015   Lactic acidosis 05/15/2015   Left knee pain 05/15/2015   Lung nodules 05/15/2015   Hypoglycemia 05/14/2015   Hypothermia 05/14/2015   Acute encephalopathy 05/14/2015   Cocaine abuse with intoxication ([place]) 05/14/2015   Alcohol intoxication in active alcoholic ([place]) 08/65/7846   Sepsis ([place]) 05/14/2015   Homeless 05/14/2015     Current Outpatient Medications on File Prior to Visit  Medication Sig Dispense Refill   apixaban (ELIQUIS) 5 MG TABS tablet Take 1 tablet (5 mg total) by mouth 2 (two) times daily. 60 tablet 5   atorvastatin (LIPITOR) 40 MG tablet Take 1 tablet (40 mg total) by mouth daily. 30 tablet 3   carvedilol (COREG) 3.125 MG tablet Take 1 tablet (3.125 mg total) by mouth 2 (two) times daily. 60 tablet 3   clopidogrel (PLAVIX) 75 MG tablet Take 1 tablet (75 mg total) by mouth daily. 30 tablet 6   digoxin (LANOXIN) 0.125 MG tablet Take 1 tablet (0.125 mg total) by mouth daily. 30 tablet 6   empagliflozin (JARDIANCE) 10 MG TABS tablet Take 1 tablet (10 mg total) by mouth daily. 30 tablet 6   famotidine (PEPCID) 20 MG tablet Take 1 tablet (20 mg total) by mouth daily. 30 tablet 6    Glycerin-Hypromellose-PEG 400 0.2-0.2-1 % SOLN Place 1 drop into both eyes as needed. (Patient taking differently: Place 1 drop into both eyes as needed (itchy, redness).) 15 mL 0   isosorbide-hydrALAZINE (BIDIL) 20-37.5 MG tablet Take 1 tablet by mouth 3 (three) times daily. 90 tablet 6   ivabradine (CORLANOR) 5 MG TABS tablet Take 1 tablet (5 mg total) by mouth 2 (two) times daily with a meal. 60 tablet 6   sacubitril-valsartan (ENTRESTO) 24-26 MG Take 1 tablet by mouth 2 (two) times daily. 60 tablet 5   triamcinolone ointment (KENALOG) 0.5 % Apply 1 Application topically 2 (two) times daily. 60 g 2   No current facility-administered medications on file prior to visit.    Allergies  Allergen Reactions   Shellfish Allergy Anaphylaxis   Peanut-Containing Drug Products Itching    Social History   Socioeconomic History   Marital status: Significant Other    Spouse name: Not on file   Number of children: 7   Years of education: Not on file   Highest education level: 9th grade  Occupational History   Occupation: unemployed    Comment: experiencing homelessness-living at daily rent motels.  Tobacco Use   Smoking status: Every Day    Packs/day: 1.00    Years: 41.00    Total pack years: 41.00    Types: Cigarettes    Passive exposure: Current   Smokeless tobacco: Never  Vaping Use  Vaping Use: Never used  Substance and Sexual Activity   Alcohol use: Yes    Alcohol/week: 63.0 standard drinks of alcohol    Types: 63 Cans of beer per week    Comment: 3-40oz beer/day x10 years. 1-2 40oz beer/day in 20s/30s   Drug use: Yes    Types: "Crack" cocaine, Cocaine    Comment: last use 01/13/22   Sexual activity: Not on file  Other Topics Concern   Not on file  Social History Narrative   Not on file   Social Determinants of Health   Financial Resource Strain: High Risk (02/21/2022)   Overall Financial Resource Strain (CARDIA)    Difficulty of Paying Living Expenses: Very hard  Food  Insecurity: No Food Insecurity (09/19/2021)   Hunger Vital Sign    Worried About Running Out of Food in the Last Year: Never true    Ran Out of Food in the Last Year: Never true  Transportation Needs: Unmet Transportation Needs (01/31/2022)   PRAPARE - Hydrologist (Medical): Yes    Lack of Transportation (Non-Medical): Yes  Physical Activity: Not on file  Stress: Not on file  Social Connections: Not on file  Intimate Partner Violence: Not on file    No family history on file.  Past Surgical History:  Procedure Laterality Date   BUBBLE STUDY  09/11/2021   Procedure: BUBBLE STUDY;  Surgeon: Fay Records, MD;  Location: [facility];  Service: Cardiovascular;;   LEFT HEART CATH AND CORONARY ANGIOGRAPHY N/A 12/19/2021   Procedure: LEFT HEART CATH AND CORONARY ANGIOGRAPHY;  Surgeon: Belva Crome, MD;  Location: [place] CV LAB;  Service: Cardiovascular;  Laterality: N/A;   TEE WITHOUT CARDIOVERSION N/A 09/11/2021   Procedure: TRANSESOPHAGEAL ECHOCARDIOGRAM (TEE);  Surgeon: Fay Records, MD;  Location: [facility] ENDOSCOPY;  Service: Cardiovascular;  Laterality: N/A;    ROS: Review of Systems Negative except as stated above  PHYSICAL EXAM: There were no vitals taken for this visit.  Physical Exam  {male adult master:310786} {male adult master:310785}     Latest Ref Rng & Units 02/05/2022   12:33 PM 01/31/2022    1:30 PM 01/15/2022    4:41 PM  CMP  Glucose 70 - 99 mg/dL 115  90  98   BUN 6 - 20 mg/dL '17  18  22   '$ Creatinine 0.61 - 1.24 mg/dL 1.39  1.56  1.63   Sodium 135 - 145 mmol/L 141  141  139   Potassium 3.5 - 5.1 mmol/L 4.3  4.1  4.1   Chloride 98 - 111 mmol/L 106  108  107   CO2 22 - 32 mmol/L '20  26  22   '$ Calcium 8.9 - 10.3 mg/dL 9.6  9.0  8.8   Total Protein 6.5 - 8.1 g/dL 7.0   6.2   Total Bilirubin 0.3 - 1.2 mg/dL 0.2   0.7   Alkaline Phos 38 - 126 U/L 85   77   AST 15 - 41 U/L 32   41   ALT 0 - 44 U/L 10   16    Lipid Panel      Component Value Date/Time   CHOL 166 12/19/2021 1727   TRIG 48 12/19/2021 1727   HDL 61 12/19/2021 1727   CHOLHDL 2.7 12/19/2021 1727   VLDL 10 12/19/2021 1727   LDLCALC 95 12/19/2021 1727    CBC    Component Value Date/Time   WBC 9.4 02/05/2022 1233  RBC 4.37 02/05/2022 1233   HGB 12.8 (L) 02/05/2022 1233   HCT 40.7 02/05/2022 1233   PLT 327 02/05/2022 1233   MCV 93.1 02/05/2022 1233   MCH 29.3 02/05/2022 1233   MCHC 31.4 02/05/2022 1233   RDW 14.4 02/05/2022 1233   LYMPHSABS 1.3 02/05/2022 1233   MONOABS 0.7 02/05/2022 1233   EOSABS 0.2 02/05/2022 1233   BASOSABS 0.0 02/05/2022 1233    ASSESSMENT AND PLAN:  There are no diagnoses linked to this encounter.   Patient was given the opportunity to ask questions.  Patient verbalized understanding of the plan and was able to repeat key elements of the plan. Patient was given clear instructions to go to Emergency Department or return to medical center if symptoms don't improve, worsen, or new problems develop.The patient verbalized understanding.   No orders of the defined types were placed in this encounter.    Requested Prescriptions    No prescriptions requested or ordered in this encounter    No follow-ups on file.  [name], NP

## 2022-03-02 NOTE — Therapy (Signed)
OUTPATIENT PHYSICAL THERAPY NEURO TREATMENT   Patient Name: Craig Schmidt MRN: [id number] DOB:01-Feb-1971, 51 y.o., male Today's Date: 03/02/2022   PCP: [name], NP REFERRING PROVIDER: [name], NP   PT End of Session - 03/02/22 1028     Visit Number 4    Number of Visits 9    Date for PT Re-Evaluation 03/06/22    Authorization Type [place] medicaid    PT Start Time 1027   patient late/in the bathroom   PT Stop Time 1101    PT Time Calculation (min) 34 min    Activity Tolerance Patient tolerated treatment well    Behavior During Therapy WFL for tasks assessed/performed            Past Medical History:  Diagnosis Date   Asthma    Brain tumor ([place])    MI (myocardial infarction) ([place])    Stroke [facility])    Past Surgical History:  Procedure Laterality Date   BUBBLE STUDY  09/11/2021   Procedure: BUBBLE STUDY;  Surgeon: Fay Records, MD;  Location: [place];  Service: Cardiovascular;;   LEFT HEART CATH AND CORONARY ANGIOGRAPHY N/A 12/19/2021   Procedure: LEFT HEART CATH AND CORONARY ANGIOGRAPHY;  Surgeon: Belva Crome, MD;  Location: [place] CV [facility];  Service: Cardiovascular;  Laterality: N/A;   TEE WITHOUT CARDIOVERSION N/A 09/11/2021   Procedure: TRANSESOPHAGEAL ECHOCARDIOGRAM (TEE);  Surgeon: Fay Records, MD;  Location: [facility] ENDOSCOPY;  Service: Cardiovascular;  Laterality: N/A;   Patient Active Problem List   Diagnosis Date Noted   Chronic combined systolic and diastolic heart failure (HCC)    ST elevation myocardial infarction (STEMI) of inferolateral wall, subsequent episode of care ([place]) 12/19/2021   ST elevation myocardial infarction (STEMI) (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   Tear of MCL (medial collateral ligament) of knee 05/20/2015   Protein-calorie malnutrition, severe 05/17/2015   Lactic acidosis  05/15/2015   Left knee pain 05/15/2015   Lung nodules 05/15/2015   Hypoglycemia 05/14/2015   Hypothermia 05/14/2015   Acute encephalopathy 05/14/2015   Cocaine abuse with intoxication ([place]) 05/14/2015   Alcohol intoxication in active alcoholic ([place]) 30/13/1438   Sepsis ([place]) 05/14/2015   Homeless 05/14/2015    ONSET DATE: referral 01/24/22  REFERRING DIAG: O87.57 (ICD-10-CM) - History of CVA (cerebrovascular accident)  THERAPY DIAG:  Unsteadiness on feet  Difficulty in walking, not elsewhere classified  Muscle weakness (generalized)  Abnormal posture  Rationale for Evaluation and Treatment Rehabilitation  SUBJECTIVE:  SUBJECTIVE STATEMENT: Patient reports doing well. Brought GiveMohr in today. Reports near fall yesterday with R LE dragging due to fatigue.  Pt accompanied by: self  PERTINENT HISTORY: asthma, previous substance abuse, CHF, recent MI   PAIN:  Are you having pain? No  VITALS There were no vitals filed for this visit.  PRECAUTIONS: Fall  WEIGHT BEARING RESTRICTIONS No    TODAY'S TREATMENT:   Self care:  -GiveMohr trials   - AFO info (faxed to [employer])   PATIENT EDUCATION: Education details: attempts to support R UE with gait, AFOs  Person educated: Patient Education method: Explanation and Handouts Education comprehension: verbalized understanding   HOME EXERCISE PROGRAM: Access Code: [email or web address] URL: [email or web address] Date: 02/23/2022 Prepared by: Mickie Bail Plaster  Exercises - Supine Bridge  - 1 x daily - 7 x weekly - 3 sets - 10 reps - March  - 1 x daily - 7 x weekly - 3 sets - 10 reps - Hooklying Clamshell with Resistance  - 1 x daily - 7 x weekly - 3 sets - 10 reps    GOALS: Goals reviewed with patient? Yes  SHORT TERM GOALS:  =  LTG based on POC length   LONG TERM GOALS: Target date: 03/06/2022  Pt will be independent with final HEP for improved balance and functional strength   Baseline: to be provided Goal status: INITIAL  2.  Pt will improve FGA to >/= 28/30 to demonstrate improved balance and reduced fall risk  Baseline: 23/30 Goal status: INITIAL  3.  Pt will improve gait speed to >/= .16ms  to demonstrate improved community ambulation  Baseline: .34m Goal status: INITIAL   ASSESSMENT:  CLINICAL IMPRESSION: Patient seen for skilled PT session with emphasis on problem solving GiveMohr sling. Patient perseverative on R shoulder bothering him when he walks. PT making multiple attempts at refitting GiveMohr to better support proximal UE to minimize tension on neurovascular structures due to subluxation with little success. PT also printing prescription for AFO to fax to [facility]. Continue POC.   OBJECTIVE IMPAIRMENTS Abnormal gait, cardiopulmonary status limiting activity, decreased activity tolerance, decreased balance, decreased endurance, decreased knowledge of condition, decreased mobility, difficulty walking, decreased strength, decreased safety awareness, impaired tone, improper body mechanics, and postural dysfunction.   ACTIVITY LIMITATIONS carrying, lifting, bending, squatting, stairs, locomotion level, and caring for others  PARTICIPATION LIMITATIONS: interpersonal relationship, driving, shopping, community activity, and yard work  PERSONAL FACTORS Behavior pattern, Education, Past/current experiences, Social background, Time since onset of injury/illness/exacerbation, Transportation, and 3+ comorbidities: substance abuse, homelessness, MI x2  are also affecting patient's functional outcome.   REHAB POTENTIAL: Fair time since onset  CLINICAL DECISION MAKING: Stable/uncomplicated  EVALUATION COMPLEXITY: Low  PLAN: PT FREQUENCY: 2x/week  PT DURATION: 4 weeks  PLANNED INTERVENTIONS:  Therapeutic exercises, Therapeutic activity, Neuromuscular re-education, Balance training, Gait training, Patient/Family education, Self Care, Joint mobilization, Stair training, Vestibular training, Canalith repositioning, Visual/preceptual remediation/compensation, Orthotic/Fit training, DME instructions, Aquatic Therapy, Cognitive remediation, Electrical stimulation, Manual therapy, and Re-evaluation  PLAN FOR NEXT SESSION: add to HEP in supine, postural work, keep trialing SpryStep Plus AFO; assess goals- need to recert to meet with [employer] to get AFO?  Managed medicaid CPT codes: [phone] PT Re-evaluation, 97110- Therapeutic Exercise, [phone] Re-education, [phone] Gait Training, [phone] Manual Therapy, 97530 - Therapeutic Activities, [phone] [phone] Electrical stimulation (unattended), [phone] Electrical stimulation (Manual), [id number] Physical performance training, [id number] Aquatic therapy, and [id number] Canalith  Repositioning   Debbora Dus, PT, DPT Debbora Dus, PT, DPT, CBIS  03/02/2022, 11:06 AM

## 2022-03-03 DIAGNOSIS — I213 ST elevation (STEMI) myocardial infarction of unspecified site: Secondary | ICD-10-CM | POA: Diagnosis not present

## 2022-03-03 DIAGNOSIS — I5043 Acute on chronic combined systolic (congestive) and diastolic (congestive) heart failure: Secondary | ICD-10-CM | POA: Diagnosis not present

## 2022-03-05 ENCOUNTER — Ambulatory Visit (INDEPENDENT_AMBULATORY_CARE_PROVIDER_SITE_OTHER): Payer: Medicaid Other | Admitting: Family

## 2022-03-05 VITALS — BP 122/68 | HR 87 | Temp 98.5°F | Resp 16 | Ht 66.5 in | Wt 150.0 lb

## 2022-03-05 DIAGNOSIS — M545 Low back pain, unspecified: Secondary | ICD-10-CM

## 2022-03-05 DIAGNOSIS — L299 Pruritus, unspecified: Secondary | ICD-10-CM | POA: Diagnosis not present

## 2022-03-05 DIAGNOSIS — G8929 Other chronic pain: Secondary | ICD-10-CM | POA: Diagnosis not present

## 2022-03-05 MED ORDER — MELOXICAM 7.5 MG PO TABS: 7.5000 mg | ORAL_TABLET | Freq: Every day | ORAL | 0 refills | Status: DC

## 2022-03-05 NOTE — Patient Instructions (Signed)
Acute Back Pain, Adult Acute back pain is sudden and usually short-lived. It is often caused by an injury to the muscles and tissues in the back. The injury may result from: A muscle, tendon, or ligament getting overstretched or torn. Ligaments are tissues that connect bones to each other. Lifting something improperly can cause a back strain. Wear and tear (degeneration) of the spinal disks. Spinal disks are circular tissue that provide cushioning between the bones of the spine (vertebrae). Twisting motions, such as while playing sports or doing yard work. A hit to the back. Arthritis. You may have a physical exam, lab tests, and imaging tests to find the cause of your pain. Acute back pain usually goes away with rest and home care. Follow these instructions at home: Managing pain, stiffness, and swelling Take over-the-counter and prescription medicines only as told by your health care provider. Treatment may include medicines for pain and inflammation that are taken by mouth or applied to the skin, or muscle relaxants. Your health care provider may recommend applying ice during the first 24-48 hours after your pain starts. To do this: Put ice in a plastic bag. Place a towel between your skin and the bag. Leave the ice on for 20 minutes, 2-3 times a day. Remove the ice if your skin turns bright red. This is very important. If you cannot feel pain, heat, or cold, you have a greater risk of damage to the area. If directed, apply heat to the affected area as often as told by your health care provider. Use the heat source that your health care provider recommends, such as a moist heat pack or a heating pad. Place a towel between your skin and the heat source. Leave the heat on for 20-30 minutes. Remove the heat if your skin turns bright red. This is especially important if you are unable to feel pain, heat, or cold. You have a greater risk of getting burned. Activity  Do not stay in bed. Staying in  bed for more than 1-2 days can delay your recovery. Sit up and stand up straight. Avoid leaning forward when you sit or hunching over when you stand. If you work at a desk, sit close to it so you do not need to lean over. Keep your chin tucked in. Keep your neck drawn back, and keep your elbows bent at a 90-degree angle (right angle). Sit high and close to the steering wheel when you drive. Add lower back (lumbar) support to your car seat, if needed. Take short walks on even surfaces as soon as you are able. Try to increase the length of time you walk each day. Do not sit, drive, or stand in one place for more than 30 minutes at a time. Sitting or standing for long periods of time can put stress on your back. Do not drive or use heavy machinery while taking prescription pain medicine. Use proper lifting techniques. When you bend and lift, use positions that put less stress on your back: Bend your knees. Keep the load close to your body. Avoid twisting. Exercise regularly as told by your health care provider. Exercising helps your back heal faster and helps prevent back injuries by keeping muscles strong and flexible. Work with a physical therapist to make a safe exercise program, as recommended by your health care provider. Do any exercises as told by your physical therapist. Lifestyle Maintain a healthy weight. Extra weight puts stress on your back and makes it difficult to have good   posture. Avoid activities or situations that make you feel anxious or stressed. Stress and anxiety increase muscle tension and can make back pain worse. Learn ways to manage anxiety and stress, such as through exercise. General instructions Sleep on a firm mattress in a comfortable position. Try lying on your side with your knees slightly bent. If you lie on your back, put a pillow under your knees. Keep your head and neck in a straight line with your spine (neutral position) when using electronic equipment like  smartphones or pads. To do this: Raise your smartphone or pad to look at it instead of bending your head or neck to look down. Put the smartphone or pad at the level of your face while looking at the screen. Follow your treatment plan as told by your health care provider. This may include: Cognitive or behavioral therapy. Acupuncture or massage therapy. Meditation or yoga. Contact a health care provider if: You have pain that is not relieved with rest or medicine. You have increasing pain going down into your legs or buttocks. Your pain does not improve after 2 weeks. You have pain at night. You lose weight without trying. You have a fever or chills. You develop nausea or vomiting. You develop abdominal pain. Get help right away if: You develop new bowel or bladder control problems. You have unusual weakness or numbness in your arms or legs. You feel faint. These symptoms may represent a serious problem that is an emergency. Do not wait to see if the symptoms will go away. Get medical help right away. Call your local emergency services (911 in the [place] Do not drive yourself to the hospital. Summary Acute back pain is sudden and usually short-lived. Use proper lifting techniques. When you bend and lift, use positions that put less stress on your back. Take over-the-counter and prescription medicines only as told by your health care provider, and apply heat or ice as told. This information is not intended to replace advice given to you by your health care provider. Make sure you discuss any questions you have with your health care provider. Document Revised: 09/30/2020 Document Reviewed: 09/30/2020 Elsevier Patient Education  2023 Elsevier Inc.  

## 2022-03-05 NOTE — Progress Notes (Signed)
Pt presents for lower back pain states that he wants a heated massage chair order.Marland Kitchen advised pt that those items are things that he can purchase on his own

## 2022-03-06 ENCOUNTER — Telehealth: Payer: Self-pay

## 2022-03-06 ENCOUNTER — Other Ambulatory Visit: Payer: Self-pay | Admitting: Family

## 2022-03-06 ENCOUNTER — Ambulatory Visit: Payer: Medicaid Other

## 2022-03-06 DIAGNOSIS — R293 Abnormal posture: Secondary | ICD-10-CM

## 2022-03-06 DIAGNOSIS — I69351 Hemiplegia and hemiparesis following cerebral infarction affecting right dominant side: Secondary | ICD-10-CM

## 2022-03-06 DIAGNOSIS — R262 Difficulty in walking, not elsewhere classified: Secondary | ICD-10-CM

## 2022-03-06 DIAGNOSIS — M6281 Muscle weakness (generalized): Secondary | ICD-10-CM

## 2022-03-06 DIAGNOSIS — R2681 Unsteadiness on feet: Secondary | ICD-10-CM

## 2022-03-06 DIAGNOSIS — M25511 Pain in right shoulder: Secondary | ICD-10-CM

## 2022-03-06 NOTE — Telephone Encounter (Signed)
Order complete. 

## 2022-03-06 NOTE — Telephone Encounter (Signed)
Good morning,  Craig Schmidt continues to complain of R shoulder pain (he is being seen by OT, but the appt is upcoming) and the GiveMohr sling he was previously given does not support his shoulder adequately. Are we able to get a standard sling for him to better support his shoulder?   Thank you,  Debbora Dus, PT, DPT, CBIS

## 2022-03-06 NOTE — Therapy (Signed)
OUTPATIENT PHYSICAL THERAPY NEURO TREATMENT/DISCHARGE SUMMARY    Patient Name: Craig Schmidt MRN: [id number] DOB:January 16, 1971, 51 y.o., male Today's Date: 03/06/2022   PCP: [name], NP REFERRING PROVIDER: [name], NP  PHYSICAL THERAPY DISCHARGE SUMMARY  Visits from Start of Care: 5  Current functional level related to goals / functional outcomes: Patient ModI without AD with all functional mobility- would benefit from AFO and is receiving one from [facility] next week   Remaining deficits: Decreased R LE muscular endurance resulting in increased foot drop (corrected by use of AFO)    Education / Equipment: HEP, PT POC, outcome measure results, use of AFO (skin checks/wear schedule)    Patient agrees to discharge. Patient goals were partially met. Patient is being discharged due to being pleased with the current functional level.   PT End of Session - 03/06/22 1024     Visit Number 5    Number of Visits 9    Date for PT Re-Evaluation 03/06/22    Authorization Type [place] medicaid    PT Start Time 1020    PT Stop Time 1055    PT Time Calculation (min) 35 min    Activity Tolerance Patient tolerated treatment well    Behavior During Therapy WFL for tasks assessed/performed            Past Medical History:  Diagnosis Date   Asthma    Brain tumor ([place])    MI (myocardial infarction) ([place])    Stroke [facility])    Past Surgical History:  Procedure Laterality Date   BUBBLE STUDY  09/11/2021   Procedure: BUBBLE STUDY;  Surgeon: Fay Records, MD;  Location: [place];  Service: Cardiovascular;;   LEFT HEART CATH AND CORONARY ANGIOGRAPHY N/A 12/19/2021   Procedure: LEFT HEART CATH AND CORONARY ANGIOGRAPHY;  Surgeon: Belva Crome, MD;  Location: [place] CV [facility];  Service: Cardiovascular;  Laterality: N/A;   TEE WITHOUT CARDIOVERSION N/A 09/11/2021   Procedure: TRANSESOPHAGEAL ECHOCARDIOGRAM (TEE);  Surgeon: Fay Records, MD;  Location: [facility] ENDOSCOPY;  Service:  Cardiovascular;  Laterality: N/A;   Patient Active Problem List   Diagnosis Date Noted   Chronic combined systolic and diastolic heart failure (HCC)    ST elevation myocardial infarction (STEMI) of inferolateral wall, subsequent episode of care ([place]) 12/19/2021   ST elevation myocardial infarction (STEMI) ([place])    Ischemic cardiomyopathy    Cocaine abuse ([place])    Smoker    Alcohol abuse    Acute ischemic stroke ([place]) 09/08/2021   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/20/2015   Tear of MCL (medial collateral ligament) of knee 05/20/2015   Protein-calorie malnutrition, severe 05/17/2015   Lactic acidosis 05/15/2015   Left knee pain 05/15/2015   Lung nodules 05/15/2015   Hypoglycemia 05/14/2015   Hypothermia 05/14/2015   Acute encephalopathy 05/14/2015   Cocaine abuse with intoxication ([place]) 05/14/2015   Alcohol intoxication in active alcoholic ([place]) 27/25/3664   Sepsis ([place]) 05/14/2015   Homeless 05/14/2015    ONSET DATE: referral 01/24/22  REFERRING DIAG: Q03.47 (ICD-10-CM) - History of CVA (cerebrovascular accident)  THERAPY DIAG:  Unsteadiness on feet  Difficulty in walking, not elsewhere classified  Muscle weakness (generalized)  Abnormal posture  Hemiplegia and hemiparesis following cerebral infarction affecting right dominant side ([place])  Rationale for Evaluation and Treatment Rehabilitation  SUBJECTIVE:  SUBJECTIVE STATEMENT: Patient reports doing well today. Hasn't heard from [facility] yet regarding his AFO. States that his air mattress is deflating causing an increase in back pain.   Pt accompanied by: self  PERTINENT HISTORY: asthma, previous substance abuse, CHF, recent MI   PAIN:  Are you having pain? Yes: NPRS scale: 7/10 Pain location: back  -was given new pain rx  from [facility], but hasn't picked it up yet.   VITALS There were no vitals filed for this visit.  PRECAUTIONS: Fall  WEIGHT BEARING RESTRICTIONS No    TODAY'S TREATMENT:   Self care:  -printed out patients medical appts   -called [employer] to make appt   - info on standard sling    [facility] PT Assessment - 03/06/22 0001       Standardized Balance Assessment   10 Meter Walk .70ms      Functional Gait  Assessment   Gait assessed  Yes    Gait Level Surface Walks 20 ft in less than 5.5 sec, no assistive devices, good speed, no evidence for imbalance, normal gait pattern, deviates no more than 6 in outside of the 12 in walkway width.    Change in Gait Speed Able to smoothly change walking speed without loss of balance or gait deviation. Deviate no more than 6 in outside of the 12 in walkway width.    Gait with Horizontal Head Turns Performs head turns smoothly with no change in gait. Deviates no more than 6 in outside 12 in walkway width    Gait with Vertical Head Turns Performs head turns with no change in gait. Deviates no more than 6 in outside 12 in walkway width.    Gait and Pivot Turn Pivot turns safely within 3 sec and stops quickly with no loss of balance.    Step Over Obstacle Is able to step over one shoe box (4.5 in total height) without changing gait speed. No evidence of imbalance.    Gait with Narrow Base of Support Ambulates 7-9 steps.    Gait with Eyes Closed Walks 20 ft, uses assistive device, slower speed, mild gait deviations, deviates 6-10 in outside 12 in walkway width. Ambulates 20 ft in less than 9 sec but greater than 7 sec.    Ambulating Backwards Walks 20 ft, uses assistive device, slower speed, mild gait deviations, deviates 6-10 in outside 12 in walkway width.    Steps Alternating feet, must use rail.    Total Score 25              PATIENT EDUCATION: Education details: PT POC, outcome measure results, AFO wear schedule/skin checks  Person educated:  Patient Education method: Explanation and Handouts Education comprehension: verbalized understanding   HOME EXERCISE PROGRAM: Access Code: [email or web address] URL: [email or web address] Date: 02/23/2022 Prepared by: JMickie BailPlaster  Exercises - Supine Bridge  - 1 x daily - 7 x weekly - 3 sets - 10 reps - March  - 1 x daily - 7 x weekly - 3 sets - 10 reps - Hooklying Clamshell with Resistance  - 1 x daily - 7 x weekly - 3 sets - 10 reps  GOALS: Goals reviewed with patient? Yes  SHORT TERM GOALS:  = LTG based on POC length   LONG TERM GOALS: Target date: 03/06/2022  Pt will be independent with final HEP for improved balance and functional strength   Baseline: provided Goal status: MET  2.  Pt will improve FGA to >/= 28/30 to demonstrate improved balance and  reduced fall risk  Baseline: 23/30; 25/30 Goal status: NOT MET  3.  Pt will improve gait speed to >/= .22ms  to demonstrate improved community ambulation  Baseline: .341m; .7354mGoal status: MET   ASSESSMENT:  CLINICAL IMPRESSION: Patient seen for skilled PT session with emphasis on goal assessment and discharge. He met 2/3 LTG. 10 Meter Walk Test: Patient instructed to walk 10 meters (32.8 ft) as quickly and as safely as possible at their normal speed x2 and at a fast speed x2. Time measured from 2 meter mark to 8 meter mark to accommodate ramp-up and ramp-down.  Normal speed: .65m79mut off scores: <0.4 m/s = household Ambulator, 0.4-0.8 m/s = limited community Ambulator, >0.8 m/s = community Ambulator, >1.2 m/s = crossing a street, <1.0 = increased fall risk MCID 0.05 m/s (small), 0.13 m/s (moderate), 0.06 m/s (significant)  (ANPTA Core Set of Outcome Measures for Adults with Neurologic Conditions, 2018). Patient scored a 25/30 on  Functional Gait Assessment.   <22/30 = predictive of falls, <20/30 = fall in 6 months, <18/30 = predictive of falls in PD MCID: 5 points stroke population, 4 points geriatric  population (ANPTA Core Set of Outcome Measures for Adults with Neurologic Conditions, 2018). PT did contact [facility] with patient present to schedule AFO appt. Scheduled for next Friday 8/25 at 9am. PT also printed patients additional upcoming medical appts. Patient to discharge from PT at this time.   OBJECTIVE IMPAIRMENTS Abnormal gait, cardiopulmonary status limiting activity, decreased activity tolerance, decreased balance, decreased endurance, decreased knowledge of condition, decreased mobility, difficulty walking, decreased strength, decreased safety awareness, impaired tone, improper body mechanics, and postural dysfunction.   ACTIVITY LIMITATIONS carrying, lifting, bending, squatting, stairs, locomotion level, and caring for others  PARTICIPATION LIMITATIONS: interpersonal relationship, driving, shopping, community activity, and yard work  PERSONAL FACTORS Behavior pattern, Education, Past/current experiences, Social background, Time since onset of injury/illness/exacerbation, Transportation, and 3+ comorbidities: substance abuse, homelessness, MI x2  are also affecting patient's functional outcome.   REHAB POTENTIAL: Fair time since onset  CLINICAL DECISION MAKING: Stable/uncomplicated  EVALUATION COMPLEXITY: Low  PLAN: PT FREQUENCY: 2x/week  PT DURATION: 4 weeks  PLANNED INTERVENTIONS: Therapeutic exercises, Therapeutic activity, Neuromuscular re-education, Balance training, Gait training, Patient/Family education, Self Care, Joint mobilization, Stair training, Vestibular training, Canalith repositioning, Visual/preceptual remediation/compensation, Orthotic/Fit training, DME instructions, Aquatic Therapy, Cognitive remediation, Electrical stimulation, Manual therapy, and Re-evaluation  PLAN FOR NEXT SESSION: patient discharged from PT.  Managed medicaid CPT codes: [phone] Re-evaluation, 97110- Therapeutic Exercise, [id number] Re-education, [id number] Training, [id number] Therapy, 97530 - Therapeutic Activities, [id number] [phone] stimulation (unattended), [phone] stimulation (Manual), [id number] Fit, [id number] performance training, [id number] therapy, and [id number] Repositioning   JennDebbora Dus, DPT JennDebbora Dus, DPT, CBIS  03/06/2022, 10:59 AM

## 2022-03-07 ENCOUNTER — Encounter: Payer: Self-pay | Admitting: *Deleted

## 2022-03-07 ENCOUNTER — Institutional Professional Consult (permissible substitution): Payer: Medicaid Other | Admitting: Pulmonary Disease

## 2022-03-07 NOTE — Congregational Nurse Program (Signed)
  Dept: [phone]   Congregational Nurse Program Note  Date of Encounter: 03/07/2022  Past Medical History: Past Medical History:  Diagnosis Date   Asthma    Brain tumor [facility])    MI (myocardial infarction) ([place])    Stroke [facility])     Encounter Details:  CNP Questionnaire - 03/07/22 1128       Questionnaire   Do you give verbal consent to treat you today? Yes    Location Patient Served  [facility]    Visit Setting Church or Organization;Phone/Text/Email    Patient Status Homeless    Insurance [employer] Referral N/A    Medication N/A    Medical Provider Yes    Screening Referrals N/A    Medical Referral N/A    Medical Appointment Made N/A    Food N/A    Transportation N/A    Housing/Utilities No permanent housing    Chiropractor N/A    Intervention [place];Support    ED Visit Averted N/A    Life-Saving Intervention Made N/A           Client came to nurse's office with three appts for August needing transportation. [facility] [phone] and set up transportation for Aug 23 appt with Mack Guise, [facility] appt 8/25 and [facility] Appt 8/30.Trip # [id number], [id number] and [id number]. Drey Shaff W RN CN

## 2022-03-07 NOTE — Progress Notes (Deleted)
Synopsis: Referred in August 2023 for asthma management, he smokes cigarettes***. He has severe systolic heart failure.   Subjective:   PATIENT ID: Craig Schmidt GENDER: male DOB: 11-02-1970, MRN: [id number]   HPI  No chief complaint on file.   ***  Record review: multiple visits with primary care in July and August 2023 reviewed where he was seen for PTSD, allergic dermatitis, health screening. Noted back pain, no respriatory complaints noted.  Past Medical History:  Diagnosis Date   Asthma    Brain tumor [facility])    MI (myocardial infarction) ([place])    Stroke ([place])      No family history on file.   Social History   Socioeconomic History   Marital status: Significant Other    Spouse name: Not on file   Number of children: 7   Years of education: Not on file   Highest education level: 9th grade  Occupational History   Occupation: unemployed    Comment: experiencing homelessness-living at daily rent motels.  Tobacco Use   Smoking status: Every Day    Packs/day: 1.00    Years: 41.00    Total pack years: 41.00    Types: Cigarettes    Passive exposure: Current   Smokeless tobacco: Never  Vaping Use   Vaping Use: Never used  Substance and Sexual Activity   Alcohol use: Yes    Alcohol/week: 63.0 standard drinks of alcohol    Types: 63 Cans of beer per week    Comment: 3-40oz beer/day x10 years. 1-2 40oz beer/day in 20s/30s   Drug use: Yes    Types: "Crack" cocaine, Cocaine    Comment: last use 01/13/22   Sexual activity: Not on file  Other Topics Concern   Not on file  Social History Narrative   Not on file   Social Determinants of Health   Financial Resource Strain: High Risk (02/21/2022)   Overall Financial Resource Strain (CARDIA)    Difficulty of Paying Living Expenses: Very hard  Food Insecurity: No Food Insecurity (09/19/2021)   Hunger Vital Sign    Worried About Running Out of Food in the Last Year: Never true    Ran Out of Food in the Last Year:  Never true  Transportation Needs: Unmet Transportation Needs (01/31/2022)   PRAPARE - Hydrologist (Medical): Yes    Lack of Transportation (Non-Medical): Yes  Physical Activity: Not on file  Stress: Not on file  Social Connections: Not on file  Intimate Partner Violence: Not on file     Allergies  Allergen Reactions   Shellfish Allergy Anaphylaxis   Peanut-Containing Drug Products Itching     Outpatient Medications Prior to Visit  Medication Sig Dispense Refill   apixaban (ELIQUIS) 5 MG TABS tablet Take 1 tablet (5 mg total) by mouth 2 (two) times daily. 60 tablet 5   atorvastatin (LIPITOR) 40 MG tablet Take 1 tablet (40 mg total) by mouth daily. 30 tablet 3   carvedilol (COREG) 3.125 MG tablet Take 1 tablet (3.125 mg total) by mouth 2 (two) times daily. 60 tablet 3   clopidogrel (PLAVIX) 75 MG tablet Take 1 tablet (75 mg total) by mouth daily. 30 tablet 6   digoxin (LANOXIN) 0.125 MG tablet Take 1 tablet (0.125 mg total) by mouth daily. 30 tablet 6   empagliflozin (JARDIANCE) 10 MG TABS tablet Take 1 tablet (10 mg total) by mouth daily. 30 tablet 6   famotidine (PEPCID) 20 MG tablet Take 1  tablet (20 mg total) by mouth daily. 30 tablet 6   Glycerin-Hypromellose-PEG 400 0.2-0.2-1 % SOLN Place 1 drop into both eyes as needed. (Patient taking differently: Place 1 drop into both eyes as needed (itchy, redness).) 15 mL 0   isosorbide-hydrALAZINE (BIDIL) 20-37.5 MG tablet Take 1 tablet by mouth 3 (three) times daily. 90 tablet 6   ivabradine (CORLANOR) 5 MG TABS tablet Take 1 tablet (5 mg total) by mouth 2 (two) times daily with a meal. 60 tablet 6   meloxicam (MOBIC) 7.5 MG tablet Take 1 tablet (7.5 mg total) by mouth daily. 30 tablet 0   sacubitril-valsartan (ENTRESTO) 24-26 MG Take 1 tablet by mouth 2 (two) times daily. 60 tablet 5   triamcinolone ointment (KENALOG) 0.5 % Apply 1 Application topically 2 (two) times daily. 60 g 2   No facility-administered  medications prior to visit.    ROS    Objective:  Physical Exam   There were no vitals filed for this visit.  ***  CBC    Component Value Date/Time   WBC 9.4 02/05/2022 1233   RBC 4.37 02/05/2022 1233   HGB 12.8 (L) 02/05/2022 1233   HCT 40.7 02/05/2022 1233   PLT 327 02/05/2022 1233   MCV 93.1 02/05/2022 1233   MCH 29.3 02/05/2022 1233   MCHC 31.4 02/05/2022 1233   RDW 14.4 02/05/2022 1233   LYMPHSABS 1.3 02/05/2022 1233   MONOABS 0.7 02/05/2022 1233   EOSABS 0.2 02/05/2022 1233   BASOSABS 0.0 02/05/2022 1233     Chest imaging: July 2023 CXR> normal  PFT:  Labs:  Path:  Echo: 11/2021 TTE> LVEF < 20%, RV OK, valves OK  Heart Catheterization: 11/2021 LHC  Occlusion of apical LAD, suspect embolic versus plaque rupture with thrombosis. Right dominant coronary anatomy.  Significant increase in diameter of right coronary after 200 mcg of intracoronary nitroglycerin was administered. Generalized luminal irregularities throughout the left coronary system. Severe systolic dysfunction with EF less than 25% and LVEDP greater than 25 mmHg.       Assessment & Plan:   No diagnosis found.  Discussion: ***    Current Outpatient Medications:    apixaban (ELIQUIS) 5 MG TABS tablet, Take 1 tablet (5 mg total) by mouth 2 (two) times daily., Disp: 60 tablet, Rfl: 5   atorvastatin (LIPITOR) 40 MG tablet, Take 1 tablet (40 mg total) by mouth daily., Disp: 30 tablet, Rfl: 3   carvedilol (COREG) 3.125 MG tablet, Take 1 tablet (3.125 mg total) by mouth 2 (two) times daily., Disp: 60 tablet, Rfl: 3   clopidogrel (PLAVIX) 75 MG tablet, Take 1 tablet (75 mg total) by mouth daily., Disp: 30 tablet, Rfl: 6   digoxin (LANOXIN) 0.125 MG tablet, Take 1 tablet (0.125 mg total) by mouth daily., Disp: 30 tablet, Rfl: 6   empagliflozin (JARDIANCE) 10 MG TABS tablet, Take 1 tablet (10 mg total) by mouth daily., Disp: 30 tablet, Rfl: 6   famotidine (PEPCID) 20 MG tablet, Take 1 tablet  (20 mg total) by mouth daily., Disp: 30 tablet, Rfl: 6   Glycerin-Hypromellose-PEG 400 0.2-0.2-1 % SOLN, Place 1 drop into both eyes as needed. (Patient taking differently: Place 1 drop into both eyes as needed (itchy, redness).), Disp: 15 mL, Rfl: 0   isosorbide-hydrALAZINE (BIDIL) 20-37.5 MG tablet, Take 1 tablet by mouth 3 (three) times daily., Disp: 90 tablet, Rfl: 6   ivabradine (CORLANOR) 5 MG TABS tablet, Take 1 tablet (5 mg total) by mouth 2 (two) times daily  with a meal., Disp: 60 tablet, Rfl: 6   meloxicam (MOBIC) 7.5 MG tablet, Take 1 tablet (7.5 mg total) by mouth daily., Disp: 30 tablet, Rfl: 0   sacubitril-valsartan (ENTRESTO) 24-26 MG, Take 1 tablet by mouth 2 (two) times daily., Disp: 60 tablet, Rfl: 5   triamcinolone ointment (KENALOG) 0.5 %, Apply 1 Application topically 2 (two) times daily., Disp: 60 g, Rfl: 2

## 2022-03-08 ENCOUNTER — Telehealth (HOSPITAL_COMMUNITY): Payer: Self-pay | Admitting: Licensed Clinical Social Worker

## 2022-03-08 ENCOUNTER — Other Ambulatory Visit (HOSPITAL_COMMUNITY): Payer: Self-pay

## 2022-03-08 NOTE — Progress Notes (Signed)
Paramedicine Encounter    Patient ID: Craig Schmidt, male    DOB: 06-Feb-1971, 51 y.o.   MRN: [id number]  Pt reports he is doing ok. He denies increased sob, no dizziness, no c/p.  Weighing almost daily. Reports taking all meds. He gets them called in and picks them up.  Still living on the street, has upcoming appointments for his disability process.  Meds verified-he has all bottles of meds. He does not use pill box. Will f/u in a couple wks.   BP (!) 142/80   Pulse 90   Resp 16   SpO2 98%  Weight yesterday-159 Last visit weight-160  Patient Care Team: [name], NP as PCP - General (Nurse Practitioner) Janina Mayo, MD as PCP - Cardiology (Cardiology) Barbaraann Faster, RN as [place] Management  Patient Active Problem List   Diagnosis Date Noted   Chronic combined systolic and diastolic heart failure (HCC)    ST elevation myocardial infarction (STEMI) of inferolateral wall, subsequent episode of care ([place]) 12/19/2021   ST elevation myocardial infarction (STEMI) ([place])    Ischemic cardiomyopathy    Cocaine abuse ([place])    Smoker    Alcohol abuse    Acute ischemic stroke ([place]) 09/08/2021   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   Tear of MCL (medial collateral ligament) of knee 05/20/2015   Protein-calorie malnutrition, severe 05/17/2015   Lactic acidosis 05/15/2015   Left knee pain 05/15/2015   Lung nodules 05/15/2015   Hypoglycemia 05/14/2015   Hypothermia 05/14/2015   Acute encephalopathy 05/14/2015   Cocaine abuse with intoxication ([place]) 05/14/2015   Alcohol intoxication in active alcoholic ([place]) 52/84/1324   Sepsis ([place]) 05/14/2015   Homeless 05/14/2015    Current Outpatient Medications:    apixaban (ELIQUIS) 5 MG TABS tablet, Take 1 tablet (5 mg total) by mouth 2 (two) times daily., Disp: 60 tablet, Rfl: 5   atorvastatin (LIPITOR) 40 MG tablet, Take 1 tablet (40 mg total) by mouth daily., Disp: 30 tablet, Rfl: 3    carvedilol (COREG) 3.125 MG tablet, Take 1 tablet (3.125 mg total) by mouth 2 (two) times daily., Disp: 60 tablet, Rfl: 3   clopidogrel (PLAVIX) 75 MG tablet, Take 1 tablet (75 mg total) by mouth daily., Disp: 30 tablet, Rfl: 6   digoxin (LANOXIN) 0.125 MG tablet, Take 1 tablet (0.125 mg total) by mouth daily., Disp: 30 tablet, Rfl: 6   empagliflozin (JARDIANCE) 10 MG TABS tablet, Take 1 tablet (10 mg total) by mouth daily., Disp: 30 tablet, Rfl: 6   famotidine (PEPCID) 20 MG tablet, Take 1 tablet (20 mg total) by mouth daily., Disp: 30 tablet, Rfl: 6   Glycerin-Hypromellose-PEG 400 0.2-0.2-1 % SOLN, Place 1 drop into both eyes as needed. (Patient taking differently: Place 1 drop into both eyes as needed (itchy, redness).), Disp: 15 mL, Rfl: 0   isosorbide-hydrALAZINE (BIDIL) 20-37.5 MG tablet, Take 1 tablet by mouth 3 (three) times daily., Disp: 90 tablet, Rfl: 6   ivabradine (CORLANOR) 5 MG TABS tablet, Take 1 tablet (5 mg total) by mouth 2 (two) times daily with a meal., Disp: 60 tablet, Rfl: 6   meloxicam (MOBIC) 7.5 MG tablet, Take 1 tablet (7.5 mg total) by mouth daily., Disp: 30 tablet, Rfl: 0   sacubitril-valsartan (ENTRESTO) 24-26 MG, Take 1 tablet by mouth 2 (two) times daily., Disp: 60 tablet, Rfl: 5   triamcinolone ointment (KENALOG) 0.5 %, Apply 1 Application topically 2 (two) times daily., Disp: 60 g, Rfl:  2 Allergies  Allergen Reactions   Shellfish Allergy Anaphylaxis   Peanut-Containing Drug Products Itching      Social History   Socioeconomic History   Marital status: Significant Other    Spouse name: Not on file   Number of children: 7   Years of education: Not on file   Highest education level: 9th grade  Occupational History   Occupation: unemployed    Comment: experiencing homelessness-living at daily rent motels.  Tobacco Use   Smoking status: Every Day    Packs/day: 1.00    Years: 41.00    Total pack years: 41.00    Types: Cigarettes    Passive exposure:  Current   Smokeless tobacco: Never  Vaping Use   Vaping Use: Never used  Substance and Sexual Activity   Alcohol use: Yes    Alcohol/week: 63.0 standard drinks of alcohol    Types: 63 Cans of beer per week    Comment: 3-40oz beer/day x10 years. 1-2 40oz beer/day in 20s/30s   Drug use: Yes    Types: "Crack" cocaine, Cocaine    Comment: last use 01/13/22   Sexual activity: Not on file  Other Topics Concern   Not on file  Social History Narrative   Not on file   Social Determinants of Health   Financial Resource Strain: High Risk (03/08/2022)   Overall Financial Resource Strain (CARDIA)    Difficulty of Paying Living Expenses: Very hard  Food Insecurity: No Food Insecurity (09/19/2021)   Hunger Vital Sign    Worried About Running Out of Food in the Last Year: Never true    Ran Out of Food in the Last Year: Never true  Transportation Needs: Unmet Transportation Needs (01/31/2022)   PRAPARE - Transportation    Lack of Transportation (Medical): Yes    Lack of Transportation (Non-Medical): Yes  Physical Activity: Not on file  Stress: Not on file  Social Connections: Not on file  Intimate Partner Violence: Not on file    Physical Exam      Future Appointments  Date Time Provider [place]  03/13/2022 11:00 AM Delton Prairie, OT [facility]  03/14/2022  9:00 AM Lanae Crumbly, [name] OC-[facility] None  03/20/2022 11:00 AM Delton Prairie, OT [facility]  03/21/2022  8:30 AM MC-HVSC PA/NP MC-[facility] None  03/27/2022 10:15 AM Delton Prairie, OT [facility]  03/29/2022 10:15 AM Delton Prairie, OT [facility]  03/29/2022  2:30 PM Erin Fulling Cheryle Horsfall, MD LBPU-PULCARE None  04/02/2022  1:20 PM [name], NP PCE-PCE None  04/03/2022 10:15 AM Delton Prairie, OT [facility]NR [facility]  04/04/2022 10:10 AM MC [place] OP MC-[facility]  04/04/2022 11:20 AM [name], MD MC-HVSC None  04/05/2022 11:00 AM Hans Eden, OT [facility]  04/10/2022 10:15 AM Delton Prairie, OT [facility]  04/12/2022 10:15 AM Delton Prairie, OT [facility]  04/17/2022 10:15 AM Hans Eden, OT [facility]  04/18/2022 10:00 AM Janina Mayo, MD CVD-NORTH[facility]  04/19/2022 10:15 AM Delton Prairie, OT [facility]  04/26/2022  8:30 AM Garvin Fila, MD GNA-GNA None       Marylouise Stacks, [place] Paramedic  03/08/22

## 2022-03-08 NOTE — Telephone Encounter (Signed)
H&V Care Navigation CSW Progress Note  Clinical Social Worker contacted patient by phone to discuss need for new bedding.  Patient is participating in a Managed Medicaid Plan:  Yes  Pt had been sent camping air mattress but it has popped so now sleeping on the ground.  CSW able to send another waterproof foam mattress that he can sleep on to be delivered to his step moms house.  SDOH Screenings   Alcohol Screen: High Risk (09/19/2021)   Alcohol Screen    Last Alcohol Screening Score (AUDIT): 16  Depression (PHQ2-9): Low Risk  (03/05/2022)   Depression (PHQ2-9)    PHQ-2 Score: 0  Financial Resource Strain: High Risk (03/08/2022)   Overall Financial Resource Strain (CARDIA)    Difficulty of Paying Living Expenses: Very hard  Food Insecurity: No Food Insecurity (09/19/2021)   Hunger Vital Sign    Worried About Running Out of Food in the Last Year: Never true    Ran Out of Food in the Last Year: Never true  Housing: High Risk (01/01/2022)   Housing    Last Housing Risk Score: 3  Physical Activity: Not on file  Social Connections: Not on file  Stress: Not on file  Tobacco Use: High Risk (03/06/2022)   Patient History    Smoking Tobacco Use: Every Day    Smokeless Tobacco Use: Never    Passive Exposure: Current  Transportation Needs: Unmet Transportation Needs (01/31/2022)   PRAPARE - Transportation    Lack of Transportation (Medical): Yes    Lack of Transportation (Non-Medical): Yes   Jorge Ny, [facility] Desk#: [phone] Cell#: [phone]

## 2022-03-09 ENCOUNTER — Ambulatory Visit: Payer: Medicaid Other

## 2022-03-13 ENCOUNTER — Encounter: Payer: Medicaid Other | Admitting: Occupational Therapy

## 2022-03-13 ENCOUNTER — Ambulatory Visit: Payer: Medicaid Other

## 2022-03-14 ENCOUNTER — Ambulatory Visit: Payer: Self-pay

## 2022-03-14 ENCOUNTER — Ambulatory Visit (INDEPENDENT_AMBULATORY_CARE_PROVIDER_SITE_OTHER): Payer: Medicaid Other | Admitting: Surgery

## 2022-03-14 DIAGNOSIS — G8929 Other chronic pain: Secondary | ICD-10-CM | POA: Diagnosis not present

## 2022-03-14 DIAGNOSIS — M5442 Lumbago with sciatica, left side: Secondary | ICD-10-CM | POA: Diagnosis not present

## 2022-03-14 DIAGNOSIS — M5441 Lumbago with sciatica, right side: Secondary | ICD-10-CM

## 2022-03-14 NOTE — Progress Notes (Signed)
Office Visit Note   Patient: Craig Schmidt           Date of Birth: 02/04/1971           MRN: [id number] Visit Date: 03/14/2022              Requested by: [name], NP [place],  [place] [address] PCP: [name], NP   Assessment & Plan: Visit Diagnoses:  1. Chronic bilateral low back pain with bilateral sciatica     Plan: Patient's ongoing low back pain and right leg weakness I will go ahead and order lumbar MRI to rule out HNP/stenosis.  Weakness is likely residual from his previous stroke but I will make sure that his lumbar spine is not a contributing factor.  Follow-up with Dr. Lorin Mercy in 3 weeks for recheck to discuss results.  Patient also continue to follow-up with neurology.  Follow-Up Instructions: Return in about 3 weeks (around 04/04/2022) for WITH DR YATES TO REVIEW LUMBAR MRI AND DISCUSS TREATMENT OPTIONS.   Orders:  Orders Placed This Encounter  Procedures   XR Lumbar Spine 2-3 Views   MR Lumbar Spine w/o contrast   No orders of the defined types were placed in this encounter.     Procedures: No procedures performed   Clinical Data: No additional findings.   Subjective: Chief Complaint  Patient presents with   Lower Back - Pain    HPI 51 year old black male who is new patient to clinic comes in with complaints of chronic low back pain.  She was referred over by his primary care nurse petitioner.  He has had ongoing low back pain that has been worsening for about 3 weeks.  He also has pain radiating down the right leg with right leg weakness.  Patient states that he did have a stroke in March 2023 and weakness has been ongoing since then.  He is followed by neurology and states that he is supposed to get a brace for his right foot and ankle weakness at [facility] this week.  Patient has gone to occupational therapy without much improvement.  He is also taken Mobic as needed.  He sleeps on an air mattress. Review of  Systems No current complaints of cardiopulmonary GI issues.  Objective: Vital Signs: There were no vitals taken for this visit.  Physical Exam HENT:     Head: Normocephalic and atraumatic.     Nose: Nose normal.  Pulmonary:     Effort: No respiratory distress.  Musculoskeletal:     Comments: Patient has bilateral lumbar paraspinal tenderness.  Positive right greater than left sciatic notch tenderness.  Negative logroll bilateral hips.  Positive right straight leg raise.  He does have trace right anterior tib and gastroc weakness.  Neurological:     Mental Status: He is alert and oriented to person, place, and time.     Ortho Exam  Specialty Comments:  No specialty comments available.  Imaging: No results found.   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  Tear of MCL (medial collateral ligament) of knee 05/20/2015   Protein-calorie malnutrition, severe 05/17/2015   Lactic acidosis 05/15/2015   Left knee pain 05/15/2015   Lung nodules 05/15/2015   Hypoglycemia 05/14/2015   Hypothermia 05/14/2015   Acute encephalopathy 05/14/2015   Cocaine abuse with intoxication ([place]) 05/14/2015   Alcohol intoxication in active alcoholic ([place]) 29/79/8921   Sepsis ([place]) 05/14/2015   Homeless 05/14/2015   Past Medical History:  Diagnosis Date   Asthma    Brain tumor ([place])    MI (myocardial infarction) ([place])    Stroke ([place])     No family history on file.  Past Surgical History:  Procedure Laterality Date   BUBBLE STUDY  09/11/2021   Procedure: BUBBLE STUDY;   Surgeon: Fay Records, MD;  Location: [place];  Service: Cardiovascular;;   LEFT HEART CATH AND CORONARY ANGIOGRAPHY N/A 12/19/2021   Procedure: LEFT HEART CATH AND CORONARY ANGIOGRAPHY;  Surgeon: Belva Crome, MD;  Location: [place] CV LAB;  Service: Cardiovascular;  Laterality: N/A;   TEE WITHOUT CARDIOVERSION N/A 09/11/2021   Procedure: TRANSESOPHAGEAL ECHOCARDIOGRAM (TEE);  Surgeon: Fay Records, MD;  Location: [facility] ENDOSCOPY;  Service: Cardiovascular;  Laterality: N/A;   Social History   Occupational History   Occupation: unemployed    Comment: experiencing homelessness-living at daily rent motels.  Tobacco Use   Smoking status: Every Day    Packs/day: 1.00    Years: 41.00    Total pack years: 41.00    Types: Cigarettes    Passive exposure: Current   Smokeless tobacco: Never  Vaping Use   Vaping Use: Never used  Substance and Sexual Activity   Alcohol use: Yes    Alcohol/week: 63.0 standard drinks of alcohol    Types: 63 Cans of beer per week    Comment: 3-40oz beer/day x10 years. 1-2 40oz beer/day in 20s/30s   Drug use: Yes    Types: "Crack" cocaine, Cocaine    Comment: last use 01/13/22   Sexual activity: Not on file

## 2022-03-16 ENCOUNTER — Ambulatory Visit: Payer: Medicaid Other

## 2022-03-20 ENCOUNTER — Telehealth (HOSPITAL_COMMUNITY): Payer: Self-pay

## 2022-03-20 ENCOUNTER — Ambulatory Visit: Payer: Medicaid Other

## 2022-03-20 ENCOUNTER — Ambulatory Visit: Payer: Medicaid Other | Admitting: Occupational Therapy

## 2022-03-20 ENCOUNTER — Other Ambulatory Visit: Payer: Self-pay | Admitting: *Deleted

## 2022-03-20 NOTE — Progress Notes (Signed)
Advanced Heart Failure Clinic Note   Primary Care: [name], NP HF Cardiologist: Dr. Aundra Dubin  HPI: Craig Schmidt is a 51 y.o. 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   Admitted 02/23 with left MCA CVA s/p TNK. Stroke felt to be likely secondary to cardiomyopathy. Echo EF 25-30%,k RV mildly reduced, RVSP 50 mmHg, moderate MS, mild to moderate MR, moderate to severe TR, dilated IVC. TEE 02/23: EF 25-30%, RV severely reduced, MV stenotic (? Parachute mitral valve), MVA 1.45 cm2 consistent with severe MS, moderate MR, no interatrial shunt, no LV or LAA thrombus. Cardiology consulted. CM likely d/t subtance abuse +/- uncontrolled HTN. Had been started on coumadin but later switched to eliquis d/t concern for compliance given his social situation.  Admitted 5/23 with acute inferolateral STEMI. Had used cocaine 24 hrs prior to presentation. Emergent LHC with 100% apical LAD suspected embolic vs plaque rupture with thrombosis. LVEDP 40. Echo this admit showed EF < 20%, ? RV okay. He was significantly volume overloaded and hypertensive. Started on lasix gtt and nitro gtt. Had good diuresis but Scr began to trend up, 1.5>1.77>1.87>2.0.  Developed soft BP and BP-active meds held. There was concern for low-output. PICC placed and Co-ox within normal range. Drips weaned and GDMT started. He was discharged home, weight 156 lbs.  Today he returns for HF follow up with [name], paramedic. Overall feeling fine. He is short of breath walking longer distances on flat ground. Limited by right hemiparesis and uses a walker but strength improving working with PT/OT. Denies palpitations, abnormal bleeding, CP, dizziness, edema, or PND/Orthopnea. Appetite ok. No fever or chills. Weight at home stable.. Taking all medications. Staying in a hotel, smokes 1-2 cigs/day, no recent  cocaine use.  ECG (personally reviewed): none ordered today.  Labs (6/23): K 3.8, creatinine 1.75 Labs (7/23): K 4.3, creatinine 1.39   Past Medical History:  Diagnosis Date   Asthma    Brain tumor ([place])    MI (myocardial infarction) ([place])    Stroke [facility])    Current Outpatient Medications  Medication Sig Dispense Refill   apixaban (ELIQUIS) 5 MG TABS tablet Take 1 tablet (5 mg total) by mouth 2 (two) times daily. 60 tablet 5   atorvastatin (LIPITOR) 40 MG tablet Take 1 tablet (40 mg total) by mouth daily. 30 tablet 3   carvedilol (COREG) 3.125 MG tablet Take 1 tablet (3.125 mg total) by mouth 2 (two) times daily. 60 tablet 3   clopidogrel (PLAVIX) 75 MG tablet Take 1 tablet (75 mg total) by mouth daily. 30 tablet 6   digoxin (LANOXIN) 0.125 MG tablet Take 1 tablet (0.125 mg total) by mouth daily. 30 tablet 6   empagliflozin (JARDIANCE) 10 MG TABS tablet Take 1 tablet (10 mg total) by mouth daily. 30 tablet 6   famotidine (PEPCID) 20 MG tablet Take 1 tablet (20 mg total) by mouth daily. 30 tablet 6   Glycerin-Hypromellose-PEG 400 0.2-0.2-1 % SOLN Place 1 drop into both eyes as needed. (Patient taking differently: Place 1 drop into both eyes as needed (itchy, redness).) 15 mL 0   isosorbide-hydrALAZINE (BIDIL) 20-37.5 MG tablet Take 1 tablet by mouth 3 (three) times daily. 90 tablet 6   meloxicam (MOBIC) 7.5 MG tablet Take 1 tablet (7.5 mg total) by mouth daily. 30 tablet 0   sacubitril-valsartan (ENTRESTO) 49-51 MG Take 1 tablet by mouth  2 (two) times daily. 60 tablet 3   triamcinolone ointment (KENALOG) 0.5 % Apply 1 Application topically 2 (two) times daily. 60 g 2   ivabradine (CORLANOR) 5 MG TABS tablet Take 0.5 tablets (2.5 mg total) by mouth 2 (two) times daily with a meal. 30 tablet 6   No current facility-administered medications for this encounter.   Allergies  Allergen Reactions   Shellfish Allergy Anaphylaxis   Peanut-Containing Drug Products Itching   Social History    Socioeconomic History   Marital status: Significant Other    Spouse name: Not on file   Number of children: 7   Years of education: Not on file   Highest education level: 9th grade  Occupational History   Occupation: unemployed    Comment: experiencing homelessness-living at daily rent motels.  Tobacco Use   Smoking status: Every Day    Packs/day: 1.00    Years: 41.00    Total pack years: 41.00    Types: Cigarettes    Passive exposure: Current   Smokeless tobacco: Never  Vaping Use   Vaping Use: Never used  Substance and Sexual Activity   Alcohol use: Yes    Alcohol/week: 63.0 standard drinks of alcohol    Types: 63 Cans of beer per week    Comment: 3-40oz beer/day x10 years. 1-2 40oz beer/day in 20s/30s   Drug use: Yes    Types: "Crack" cocaine, Cocaine    Comment: last use 01/13/22   Sexual activity: Not on file  Other Topics Concern   Not on file  Social History Narrative   Not on file   Social Determinants of Health   Financial Resource Strain: High Risk (03/21/2022)   Overall Financial Resource Strain (CARDIA)    Difficulty of Paying Living Expenses: Very hard  Food Insecurity: No Food Insecurity (09/19/2021)   Hunger Vital Sign    Worried About Running Out of Food in the Last Year: Never true    Ran Out of Food in the Last Year: Never true  Transportation Needs: Unmet Transportation Needs (03/21/2022)   PRAPARE - Hydrologist (Medical): Yes    Lack of Transportation (Non-Medical): Yes  Physical Activity: Not on file  Stress: Not on file  Social Connections: Not on file  Intimate Partner Violence: Not on file   No family history on file.  BP (!) 167/98 Comment: 116  Pulse 68   Wt 74 kg (163 lb 3.2 oz)   BMI 25.95 kg/m   Wt Readings from Last 3 Encounters:  03/21/22 74 kg (163 lb 3.2 oz)  03/05/22 68 kg (150 lb)  02/26/22 70.3 kg (155 lb)   PHYSICAL EXAM: General:  NAD. No resp difficulty HEENT: Normal Neck: Supple. No  JVD. Carotids 2+ bilat; no bruits. No lymphadenopathy or thryomegaly appreciated. Cor: PMI nondisplaced. Regular rate & rhythm. No rubs, gallops or murmurs. Lungs: Clear Abdomen: Soft, nontender, nondistended. No hepatosplenomegaly. No bruits or masses. Good bowel sounds. Extremities: No cyanosis, clubbing, rash, edema Neuro: Alert & oriented x 3, cranial nerves grossly intact. +R sided hemiparesis. Affect pleasant.  ASSESSMENT & PLAN: 1. CAD: Admit 6/23 with inferolateral STEMI by ECG, cath showed occluded apical LAD.  Cannot rule out plaque rupture in setting of cocaine abuse, but given history of suspected cardioembolic CVA, wonder if this was not a cardioembolic MI.  No chest pain.  - Continue atorvastatin  - Continue Plavix (post-MI) + Eliquis.  Think he should remain anticoagulated (was supposed to be  on prior to admission but not sure if he was taking) given suspected cardioembolism.  2. Chronic systolic CHF: Patient was found to have cardiomyopathy in 2/23 at time of admission for CVA.  Echo showed EF 25-30% at that time.  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  Echo this admission showed LV EF < 20%, RV mildly decreased function, abnormal mitral valve looks rheumatic => suspect moderate mitral stenosis at least with planimetered MVA 1.6 cm^2 and calculated MVA by VTI 0.8 cm^2; mean gradient only 6 mmHg but likely function of low cardiac output. The IVC was small/non-dilated. Suspect he was not taking his medications correctly at home.  NYHA II symptoms, functional status difficult given CVA and deconditioning. - Increase Entresto to 49/51 mg bid. BMET today, repeat at follow up in 2 weeks. - Continue digoxin 0.125. Check dig level. - Continue Bidil 1 tab tid. - Decrease Corlanor to 2.5 mg bid. HR 68 today. - Continue Jardiance 10 mg daily.   - Continue carvedilol 3.125 mg bid. - Not a candidate for advanced therapies or home  inotropes with active substance abuse (used cocaine just prior to admission).  - Repeat echo next appt. 3. CVA: 2/23, has residual right-sided weakness.  Thought to be cardioembolic at the time, started on anticoagulation.   - Use Eliquis given concerns with compliance with warfarin INR checks. No abnormal bleeding. 4. Substance abuse: Encouraged complete cessation. 5. 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   Dr. Aundra Dubin reviewed this admission's echo, it shows moderate mitral stenosis at least with planimetered MVA 1.6 cm^2 and calculated MVA by VTI 0.8 cm^2; mean gradient only 6 mmHg but possibly function of low cardiac output.  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  Unusual given age. Hemodynamic LHC/RHC eventually would be helpful for full evaluation of mitral stenosis but he refused and not currently a good surgical candidate with severe biventricular failure (though will need to review prior TEE for valvuloplasty candidacy).  - Not currently candidate for valve replacement with substance abuse and markedly low LV function.   6. Homeless: Staying in [place]. HFSW helping with resources. - Gifted $25 gift card today for bedding. 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  - Continue to work with PT, use walker.  8. Ventricular Trigeminy: BMET today.   Keep follow up next month with Dr. Aundra Dubin + echo.  Allena Katz, [name]-BC 03/21/22

## 2022-03-20 NOTE — Telephone Encounter (Signed)
Called to confirm/remind patient of their appointment at the [facility] on 03/21/22.   Patient reminded to bring all medications and/or complete list.  Confirmed patient has transportation. Gave directions, instructed to utilize [place] parking.  Confirmed appointment prior to ending call.

## 2022-03-20 NOTE — Patient Outreach (Signed)
[facility]) Care Management  03/20/2022  Craig Schmidt 01/16/1971 [id number]   [facility] Case closure   Call attempts made on 10/13/21 without a response   [facility] RN CM will close case  No further interventions needed  Available [facility] medicaid RN CM services available    [place] L. Lavina Hamman, RN, BSN, [place] Coordinator Office number [phone] [facility] number [phone] Fax number [phone]

## 2022-03-21 ENCOUNTER — Ambulatory Visit (HOSPITAL_COMMUNITY)
Admission: RE | Admit: 2022-03-21 | Discharge: 2022-03-21 | Disposition: A | Discharge status: Home / Self Care | Payer: Medicaid Other | Source: Ambulatory Visit | Attending: Family Medicine | Admitting: Family Medicine

## 2022-03-21 ENCOUNTER — Other Ambulatory Visit (HOSPITAL_COMMUNITY): Payer: Self-pay

## 2022-03-21 ENCOUNTER — Encounter (HOSPITAL_COMMUNITY): Payer: Self-pay

## 2022-03-21 ENCOUNTER — Telehealth (HOSPITAL_COMMUNITY): Payer: Self-pay

## 2022-03-21 VITALS — BP 167/98 | HR 68 | Wt 163.2 lb

## 2022-03-21 DIAGNOSIS — I050 Rheumatic mitral stenosis: Secondary | ICD-10-CM | POA: Diagnosis not present

## 2022-03-21 DIAGNOSIS — I5022 Chronic systolic (congestive) heart failure: Secondary | ICD-10-CM | POA: Diagnosis not present

## 2022-03-21 DIAGNOSIS — Z79899 Other long term (current) drug therapy: Secondary | ICD-10-CM | POA: Insufficient documentation

## 2022-03-21 DIAGNOSIS — Z5900 Homelessness unspecified: Secondary | ICD-10-CM | POA: Diagnosis not present

## 2022-03-21 DIAGNOSIS — Z7984 Long term (current) use of oral hypoglycemic drugs: Secondary | ICD-10-CM | POA: Diagnosis not present

## 2022-03-21 DIAGNOSIS — Z8673 Personal history of transient ischemic attack (TIA), and cerebral infarction without residual deficits: Secondary | ICD-10-CM | POA: Diagnosis not present

## 2022-03-21 DIAGNOSIS — R42 Dizziness and giddiness: Secondary | ICD-10-CM | POA: Diagnosis not present

## 2022-03-21 DIAGNOSIS — R008 Other abnormalities of heart beat: Secondary | ICD-10-CM | POA: Diagnosis not present

## 2022-03-21 DIAGNOSIS — I252 Old myocardial infarction: Secondary | ICD-10-CM | POA: Diagnosis not present

## 2022-03-21 DIAGNOSIS — Q232 Congenital mitral stenosis: Secondary | ICD-10-CM | POA: Diagnosis not present

## 2022-03-21 DIAGNOSIS — I69351 Hemiplegia and hemiparesis following cerebral infarction affecting right dominant side: Secondary | ICD-10-CM | POA: Insufficient documentation

## 2022-03-21 DIAGNOSIS — R531 Weakness: Secondary | ICD-10-CM | POA: Insufficient documentation

## 2022-03-21 DIAGNOSIS — F1410 Cocaine abuse, uncomplicated: Secondary | ICD-10-CM | POA: Diagnosis not present

## 2022-03-21 DIAGNOSIS — I2510 Atherosclerotic heart disease of native coronary artery without angina pectoris: Secondary | ICD-10-CM

## 2022-03-21 DIAGNOSIS — D333 Benign neoplasm of cranial nerves: Secondary | ICD-10-CM

## 2022-03-21 DIAGNOSIS — Z7901 Long term (current) use of anticoagulants: Secondary | ICD-10-CM | POA: Insufficient documentation

## 2022-03-21 DIAGNOSIS — Z452 Encounter for adjustment and management of vascular access device: Secondary | ICD-10-CM | POA: Diagnosis not present

## 2022-03-21 DIAGNOSIS — Z7902 Long term (current) use of antithrombotics/antiplatelets: Secondary | ICD-10-CM | POA: Diagnosis not present

## 2022-03-21 DIAGNOSIS — F1910 Other psychoactive substance abuse, uncomplicated: Secondary | ICD-10-CM

## 2022-03-21 DIAGNOSIS — I110 Hypertensive heart disease with heart failure: Secondary | ICD-10-CM | POA: Diagnosis not present

## 2022-03-21 LAB — MAGNESIUM: Magnesium: 2 mg/dL (ref 1.7–2.4)

## 2022-03-21 LAB — BASIC METABOLIC PANEL
Anion gap: 4 — ABNORMAL LOW (ref 5–15)
BUN: 17 mg/dL (ref 6–20)
CO2: 27 mmol/L (ref 22–32)
Calcium: 9.2 mg/dL (ref 8.9–10.3)
Chloride: 110 mmol/L (ref 98–111)
Creatinine, Ser: 1.37 mg/dL — ABNORMAL HIGH (ref 0.61–1.24)
GFR, Estimated: >60 mL/min (ref >60)
Glucose, Bld: 107 mg/dL — ABNORMAL HIGH (ref 70–99)
Potassium: 4.9 mmol/L (ref 3.5–5.1)
Sodium: 141 mmol/L (ref 135–145)

## 2022-03-21 LAB — DIGOXIN LEVEL: Digoxin Level: 0.2 ng/mL — ABNORMAL LOW (ref 0.8–2.0)

## 2022-03-21 MED ORDER — IVABRADINE HCL 5 MG PO TABS: 2.5000 mg | ORAL_TABLET | Freq: Two times a day (BID) | ORAL | 6 refills | Status: DC

## 2022-03-21 MED ORDER — ENTRESTO 49-51 MG PO TABS: 1.0000 | ORAL_TABLET | Freq: Two times a day (BID) | ORAL | 3 refills | Status: DC

## 2022-03-21 NOTE — Telephone Encounter (Signed)
I called pt and went thru his med list and he gave me a count of the pills he had left and he will need more entresto and just ran out of the jardiance. I called pharmacy and they have to order the entresto-he said he hopes it would be in tomor but if not then Friday- I will check back tomor to see if the pharmacy received it and will p/u if so, if not I told pt he may have to catch a bus over there Friday to p/u.  He only has 6 tablets left of the entresto so I told him to just keep taking it 1 tab BID until he gets new bottle in case there is a bigger lapse in getting his refill.  Pharmacy said they are working on digoxin, famotidine and clopidogrel as well.    Marylouise Stacks, [place] 03/21/2022

## 2022-03-21 NOTE — Progress Notes (Signed)
H&V Care Navigation CSW Progress Note  Clinical Social Worker  assisted with transportation and financial needs identified by staff during clinic visit.  Patient is participating in a Managed Medicaid Plan:  Yes  CSW assisted with gift card from Patient [place] as patient identified need for additional bedding. Patient also provided bus pass for transport home post clinic visit. Raquel Sarna, LCSW, CCSW-MCS [phone]   SDOH Screenings   Alcohol Screen: High Risk (09/19/2021)   Alcohol Screen    Last Alcohol Screening Score (AUDIT): 16  Depression (PHQ2-9): Low Risk  (03/05/2022)   Depression (PHQ2-9)    PHQ-2 Score: 0  Financial Resource Strain: High Risk (03/21/2022)   Overall Financial Resource Strain (CARDIA)    Difficulty of Paying Living Expenses: Very hard  Food Insecurity: No Food Insecurity (09/19/2021)   Hunger Vital Sign    Worried About Running Out of Food in the Last Year: Never true    Ran Out of Food in the Last Year: Never true  Housing: High Risk (01/01/2022)   Housing    Last Housing Risk Score: 3  Physical Activity: Not on file  Social Connections: Not on file  Stress: Not on file  Tobacco Use: High Risk (03/21/2022)   Patient History    Smoking Tobacco Use: Every Day    Smokeless Tobacco Use: Never    Passive Exposure: Current  Transportation Needs: Unmet Transportation Needs (03/21/2022)   PRAPARE - Transportation    Lack of Transportation (Medical): Yes    Lack of Transportation (Non-Medical): Yes

## 2022-03-21 NOTE — Patient Instructions (Signed)
Medication Changes:  INCREASE Entresto to 49/51 mg Twice daily   DECREASE Ivabradine to 2.5 mg (1/2 tab) Twice daily   Lab Work:  Labs done today, your results will be available in MyChart, we will contact you for abnormal readings.  Testing/Procedures:  Your physician has requested that you have an echocardiogram. Echocardiography is a painless test that uses sound waves to create images of your heart. It provides your doctor with information about the size and shape of your heart and how well your heart's chambers and valves are working. This procedure takes approximately one hour. There are no restrictions for this procedure. 04/04/22 AT 10:10 AM  Referrals:  none  Special Instructions // Education:  Do the following things EVERYDAY: Weigh yourself in the morning before breakfast. Write it down and keep it in a log. Take your medicines as prescribed Eat low salt foods--Limit salt (sodium) to 2000 mg per day.  Stay as active as you can everyday Limit all fluids for the day to less than 2 liters   Follow-Up in: AS SCHEDULED 04/04/22 with Dr Aundra Dubin  At the [facility], you and your health needs are our priority. We have a designated team specialized in the treatment of Heart Failure. This Care Team includes your primary Heart Failure Specialized Cardiologist (physician), Advanced Practice Providers (APPs- Physician Assistants and Nurse Practitioners), and Pharmacist who all work together to provide you with the care you need, when you need it.   You may see any of the following providers on your designated Care Team at your next follow up:  Dr Glori Bickers Dr Haynes Kerns, NP [name], [place] [facility] [place], [place] Audry Riles, PharmD   Please be sure to bring in all your medications bottles to every appointment.   Need to Contact us:  If you have any questions or concerns before your next appointment please send [place] a  message through [place] or call our office at [phone].    TO LEAVE A MESSAGE FOR THE NURSE SELECT OPTION 2, PLEASE LEAVE A MESSAGE INCLUDING: YOUR NAME DATE OF BIRTH CALL BACK NUMBER REASON FOR CALL**this is important as we prioritize the call backs  YOU WILL RECEIVE A CALL BACK THE SAME DAY AS LONG AS YOU CALL BEFORE 4:00 PM

## 2022-03-21 NOTE — Progress Notes (Signed)
Paramedicine Encounter   Patient ID: Craig Schmidt , male,   DOB: 08/01/1970,51 y.o.,  MRN: [id number]   Pt reports IRC has put them in a hotel for a month.  He is staying at the [address].   He is unsure if he is out of any meds. He thinks he is but not sure which ones.  Once he gets back to the room he will call me and we will go down his med list and see which ones he is missing and I will go by pharmacy to grab them and take them out to him since he is much further away from the pharmacy now being at the hotel.  He has cancelled many rehab appointments due to his friends appointments.  He has not taken meds yet this morning.  Will go see him this afternoon with these med changes.  He is due to come back in a few wks to get ECHO.    Met patient in clinic today with provider.   Weight @ clinic-163 B/P-167/98 P-68 SP02- Labs today. And then labs again when he comes back in a couple wks.   Med changes-decrease corlanor 2.35m BID        Increase entresto to 49-574m  KaMarylouise StacksEMGlennallen/30/2023

## 2022-03-22 ENCOUNTER — Other Ambulatory Visit (HOSPITAL_COMMUNITY): Payer: Self-pay

## 2022-03-22 NOTE — Progress Notes (Signed)
Went by [facility] pharmacy to p/u his meds and took them to him. He is now in a hotel at the [address] for the next month.   Since he is now on the opposite side of town I am going to pass him over to my partner, Dede. She will take over and see him next week.   He had several bottles of meds that should have been empty by now but still had a lot of meds left in it, so I dont think he has been as compliant with his meds like he thinks.   Marylouise Stacks, [place] 03/22/2022'

## 2022-03-25 NOTE — Progress Notes (Deleted)
Patient ID: Craig Schmidt, male    DOB: 10-30-1970  MRN: [id number]  CC: Back Pain  Subjective: Craig Schmidt is a 51 y.o. male who presents for back pain.  His concerns today include:  Referred to Ortho 03/05/2022 and has appt scheduled 04/04/2022.   Patient Active Problem List   Diagnosis Date Noted   Chronic combined systolic and diastolic heart failure (HCC)    ST elevation myocardial infarction (STEMI) of inferolateral wall, subsequent episode of care ([place]) 12/19/2021   ST elevation myocardial infarction (STEMI) ([place])    Ischemic cardiomyopathy    Cocaine abuse ([place])    Smoker    Alcohol abuse    Acute ischemic stroke ([place]) 09/08/2021   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[place]) 05/20/2015   Tear of MCL (medial collateral ligament) of knee 05/20/2015   Protein-calorie malnutrition, severe 05/17/2015   Lactic acidosis 05/15/2015   Left knee pain 05/15/2015   Lung nodules 05/15/2015   Hypoglycemia 05/14/2015   Hypothermia 05/14/2015   Acute encephalopathy 05/14/2015   Cocaine abuse with intoxication ([place]) 05/14/2015   Alcohol intoxication in active alcoholic ([place]) 80/99/8338   Sepsis ([place]) 05/14/2015   Homeless 05/14/2015     Current Outpatient Medications on File Prior to Visit  Medication Sig Dispense Refill   apixaban (ELIQUIS) 5 MG TABS tablet Take 1 tablet (5 mg total) by mouth 2 (two) times daily. 60 tablet 5   atorvastatin (LIPITOR) 40 MG tablet Take 1 tablet (40 mg total) by mouth daily. 30 tablet 3   carvedilol (COREG) 3.125 MG tablet Take 1 tablet (3.125 mg total) by mouth 2 (two) times daily. 60 tablet 3   clopidogrel (PLAVIX) 75 MG tablet Take 1 tablet (75 mg total) by mouth daily. 30 tablet 6   digoxin (LANOXIN) 0.125 MG tablet Take 1 tablet (0.125 mg total) by mouth daily. 30 tablet 6   empagliflozin (JARDIANCE) 10 MG TABS tablet Take 1 tablet (10 mg total) by mouth daily. 30 tablet 6   famotidine (PEPCID) 20 MG tablet Take 1 tablet (20 mg total) by  mouth daily. 30 tablet 6   Glycerin-Hypromellose-PEG 400 0.2-0.2-1 % SOLN Place 1 drop into both eyes as needed. (Patient taking differently: Place 1 drop into both eyes as needed (itchy, redness).) 15 mL 0   isosorbide-hydrALAZINE (BIDIL) 20-37.5 MG tablet Take 1 tablet by mouth 3 (three) times daily. 90 tablet 6   ivabradine (CORLANOR) 5 MG TABS tablet Take 0.5 tablets (2.5 mg total) by mouth 2 (two) times daily with a meal. 30 tablet 6   meloxicam (MOBIC) 7.5 MG tablet Take 1 tablet (7.5 mg total) by mouth daily. 30 tablet 0   sacubitril-valsartan (ENTRESTO) 49-51 MG Take 1 tablet by mouth 2 (two) times daily. 60 tablet 3   triamcinolone ointment (KENALOG) 0.5 % Apply 1 Application topically 2 (two) times daily. 60 g 2   No current facility-administered medications on file prior to visit.    Allergies  Allergen Reactions   Shellfish Allergy Anaphylaxis   Peanut-Containing Drug Products Itching    Social History   Socioeconomic History   Marital status: Significant Other    Spouse name: Not on file   Number of children: 7   Years of education: Not on file   Highest education level: 9th grade  Occupational History   Occupation: unemployed    Comment: experiencing homelessness-living at daily rent motels.  Tobacco Use   Smoking status: Every Day    Packs/day: 1.00    Years:  41.00    Total pack years: 41.00    Types: Cigarettes    Passive exposure: Current   Smokeless tobacco: Never  Vaping Use   Vaping Use: Never used  Substance and Sexual Activity   Alcohol use: Yes    Alcohol/week: 63.0 standard drinks of alcohol    Types: 63 Cans of beer per week    Comment: 3-40oz beer/day x10 years. 1-2 40oz beer/day in 20s/30s   Drug use: Yes    Types: "Crack" cocaine, Cocaine    Comment: last use 01/13/22   Sexual activity: Not on file  Other Topics Concern   Not on file  Social History Narrative   Not on file   Social Determinants of Health   Financial Resource Strain:  High Risk (03/21/2022)   Overall Financial Resource Strain (CARDIA)    Difficulty of Paying Living Expenses: Very hard  Food Insecurity: No Food Insecurity (09/19/2021)   Hunger Vital Sign    Worried About Running Out of Food in the Last Year: Never true    Ran Out of Food in the Last Year: Never true  Transportation Needs: Unmet Transportation Needs (03/21/2022)   PRAPARE - Hydrologist (Medical): Yes    Lack of Transportation (Non-Medical): Yes  Physical Activity: Not on file  Stress: Not on file  Social Connections: Not on file  Intimate Partner Violence: Not on file    No family history on file.  Past Surgical History:  Procedure Laterality Date   BUBBLE STUDY  09/11/2021   Procedure: BUBBLE STUDY;  Surgeon: Fay Records, MD;  Location: [place];  Service: Cardiovascular;;   LEFT HEART CATH AND CORONARY ANGIOGRAPHY N/A 12/19/2021   Procedure: LEFT HEART CATH AND CORONARY ANGIOGRAPHY;  Surgeon: Belva Crome, MD;  Location: [place] CV LAB;  Service: Cardiovascular;  Laterality: N/A;   TEE WITHOUT CARDIOVERSION N/A 09/11/2021   Procedure: TRANSESOPHAGEAL ECHOCARDIOGRAM (TEE);  Surgeon: Fay Records, MD;  Location: [facility] ENDOSCOPY;  Service: Cardiovascular;  Laterality: N/A;    ROS: Review of Systems Negative except as stated above  PHYSICAL EXAM: There were no vitals taken for this visit.  Physical Exam  {male adult master:310786} {male adult master:310785}     Latest Ref Rng & Units 03/21/2022    9:20 AM 02/05/2022   12:33 PM 01/31/2022    1:30 PM  CMP  Glucose 70 - 99 mg/dL 107  115  90   BUN 6 - 20 mg/dL '17  17  18   '$ Creatinine 0.61 - 1.24 mg/dL 1.37  1.39  1.56   Sodium 135 - 145 mmol/L 141  141  141   Potassium 3.5 - 5.1 mmol/L 4.9  4.3  4.1   Chloride 98 - 111 mmol/L 110  106  108   CO2 22 - 32 mmol/L '27  20  26   '$ Calcium 8.9 - 10.3 mg/dL 9.2  9.6  9.0   Total Protein 6.5 - 8.1 g/dL  7.0    Total Bilirubin 0.3 - 1.2 mg/dL   0.2    Alkaline Phos 38 - 126 U/L  85    AST 15 - 41 U/L  32    ALT 0 - 44 U/L  10     Lipid Panel     Component Value Date/Time   CHOL 166 12/19/2021 1727   TRIG 48 12/19/2021 1727   HDL 61 12/19/2021 1727   CHOLHDL 2.7 12/19/2021 1727   VLDL 10 12/19/2021  1727   LDLCALC 95 12/19/2021 1727    CBC    Component Value Date/Time   WBC 9.4 02/05/2022 1233   RBC 4.37 02/05/2022 1233   HGB 12.8 (L) 02/05/2022 1233   HCT 40.7 02/05/2022 1233   PLT 327 02/05/2022 1233   MCV 93.1 02/05/2022 1233   MCH 29.3 02/05/2022 1233   MCHC 31.4 02/05/2022 1233   RDW 14.4 02/05/2022 1233   LYMPHSABS 1.3 02/05/2022 1233   MONOABS 0.7 02/05/2022 1233   EOSABS 0.2 02/05/2022 1233   BASOSABS 0.0 02/05/2022 1233    ASSESSMENT AND PLAN:  There are no diagnoses linked to this encounter.   Patient was given the opportunity to ask questions.  Patient verbalized understanding of the plan and was able to repeat key elements of the plan. Patient was given clear instructions to go to Emergency Department or return to medical center if symptoms don't improve, worsen, or new problems develop.The patient verbalized understanding.   No orders of the defined types were placed in this encounter.    Requested Prescriptions    No prescriptions requested or ordered in this encounter    No follow-ups on file.  [name], NP

## 2022-03-27 ENCOUNTER — Ambulatory Visit: Payer: Medicaid Other | Admitting: Occupational Therapy

## 2022-03-28 ENCOUNTER — Ambulatory Visit
Admission: RE | Admit: 2022-03-28 | Discharge: 2022-03-28 | Disposition: A | Discharge status: Home / Self Care | Payer: Medicaid Other | Source: Ambulatory Visit | Attending: Surgery | Admitting: Surgery

## 2022-03-28 DIAGNOSIS — G8929 Other chronic pain: Secondary | ICD-10-CM

## 2022-03-28 DIAGNOSIS — M545 Low back pain, unspecified: Secondary | ICD-10-CM | POA: Diagnosis not present

## 2022-03-29 ENCOUNTER — Institutional Professional Consult (permissible substitution): Payer: Medicaid Other | Admitting: Pulmonary Disease

## 2022-03-29 ENCOUNTER — Other Ambulatory Visit: Payer: Self-pay

## 2022-03-29 ENCOUNTER — Ambulatory Visit: Payer: Medicaid Other | Admitting: Occupational Therapy

## 2022-03-29 ENCOUNTER — Telehealth (HOSPITAL_COMMUNITY): Payer: Self-pay | Admitting: Licensed Clinical Social Worker

## 2022-03-29 NOTE — Progress Notes (Deleted)
Synopsis: Referred in September 2023 for asthma by [name], NP  Subjective:   PATIENT ID: Craig Schmidt GENDER: male DOB: 07-11-1971, MRN: [id number]  HPI  No chief complaint on file.  Craig Schmidt is a 51 year old male, daily smoker with history of brain tumor, myocardial infarction, cardiomyopathy EF 25-30% and stroke who is referred to pulmonary clinic for asthma.     Past Medical History:  Diagnosis Date   Asthma    Brain tumor [facility])    MI (myocardial infarction) ([place])    Stroke ([place])      No family history on file.   Social History   Socioeconomic History   Marital status: Significant Other    Spouse name: Not on file   Number of children: 7   Years of education: Not on file   Highest education level: 9th grade  Occupational History   Occupation: unemployed    Comment: experiencing homelessness-living at daily rent motels.  Tobacco Use   Smoking status: Every Day    Packs/day: 1.00    Years: 41.00    Total pack years: 41.00    Types: Cigarettes    Passive exposure: Current   Smokeless tobacco: Never  Vaping Use   Vaping Use: Never used  Substance and Sexual Activity   Alcohol use: Yes    Alcohol/week: 63.0 standard drinks of alcohol    Types: 63 Cans of beer per week    Comment: 3-40oz beer/day x10 years. 1-2 40oz beer/day in 20s/30s   Drug use: Yes    Types: "Crack" cocaine, Cocaine    Comment: last use 01/13/22   Sexual activity: Not on file  Other Topics Concern   Not on file  Social History Narrative   Not on file   Social Determinants of Health   Financial Resource Strain: High Risk (03/21/2022)   Overall Financial Resource Strain (CARDIA)    Difficulty of Paying Living Expenses: Very hard  Food Insecurity: No Food Insecurity (09/19/2021)   Hunger Vital Sign    Worried About Running Out of Food in the Last Year: Never true    Ran Out of Food in the Last Year: Never true  Transportation Needs: Unmet Transportation Needs  (03/21/2022)   PRAPARE - Hydrologist (Medical): Yes    Lack of Transportation (Non-Medical): Yes  Physical Activity: Not on file  Stress: Not on file  Social Connections: Not on file  Intimate Partner Violence: Not on file     Allergies  Allergen Reactions   Shellfish Allergy Anaphylaxis   Peanut-Containing Drug Products Itching     Outpatient Medications Prior to Visit  Medication Sig Dispense Refill   apixaban (ELIQUIS) 5 MG TABS tablet Take 1 tablet (5 mg total) by mouth 2 (two) times daily. 60 tablet 5   atorvastatin (LIPITOR) 40 MG tablet Take 1 tablet (40 mg total) by mouth daily. 30 tablet 3   carvedilol (COREG) 3.125 MG tablet Take 1 tablet (3.125 mg total) by mouth 2 (two) times daily. 60 tablet 3   clopidogrel (PLAVIX) 75 MG tablet Take 1 tablet (75 mg total) by mouth daily. 30 tablet 6   digoxin (LANOXIN) 0.125 MG tablet Take 1 tablet (0.125 mg total) by mouth daily. 30 tablet 6   empagliflozin (JARDIANCE) 10 MG TABS tablet Take 1 tablet (10 mg total) by mouth daily. 30 tablet 6   famotidine (PEPCID) 20 MG tablet Take 1 tablet (20 mg total) by mouth daily. [place]  tablet 6   Glycerin-Hypromellose-PEG 400 0.2-0.2-1 % SOLN Place 1 drop into both eyes as needed. (Patient taking differently: Place 1 drop into both eyes as needed (itchy, redness).) 15 mL 0   isosorbide-hydrALAZINE (BIDIL) 20-37.5 MG tablet Take 1 tablet by mouth 3 (three) times daily. 90 tablet 6   ivabradine (CORLANOR) 5 MG TABS tablet Take 0.5 tablets (2.5 mg total) by mouth 2 (two) times daily with a meal. 30 tablet 6   meloxicam (MOBIC) 7.5 MG tablet Take 1 tablet (7.5 mg total) by mouth daily. 30 tablet 0   sacubitril-valsartan (ENTRESTO) 49-51 MG Take 1 tablet by mouth 2 (two) times daily. 60 tablet 3   triamcinolone ointment (KENALOG) 0.5 % Apply 1 Application topically 2 (two) times daily. 60 g 2   No facility-administered medications prior to visit.    Review of Systems   Constitutional:  Negative for chills, fever, malaise/fatigue and weight loss.  HENT:  Negative for congestion, sinus pain and sore throat.   Eyes: Negative.   Respiratory:  Negative for cough, hemoptysis, sputum production, shortness of breath and wheezing.   Cardiovascular:  Negative for chest pain, palpitations, orthopnea, claudication and leg swelling.  Gastrointestinal:  Negative for abdominal pain, heartburn, nausea and vomiting.  Genitourinary: Negative.   Musculoskeletal:  Negative for joint pain and myalgias.  Skin:  Negative for rash.  Neurological:  Negative for weakness.  Endo/Heme/Allergies: Negative.   Psychiatric/Behavioral: Negative.        Objective:  There were no vitals filed for this visit.   Physical Exam Constitutional:      General: He is not in acute distress. HENT:     Head: Normocephalic and atraumatic.  Eyes:     Extraocular Movements: Extraocular movements intact.     Conjunctiva/sclera: Conjunctivae normal.     Pupils: Pupils are equal, round, and reactive to light.  Cardiovascular:     Rate and Rhythm: Normal rate and regular rhythm.     Pulses: Normal pulses.     Heart sounds: Normal heart sounds. No murmur heard. Abdominal:     General: Bowel sounds are normal.     Palpations: Abdomen is soft.  Musculoskeletal:     Right lower leg: No edema.     Left lower leg: No edema.  Lymphadenopathy:     Cervical: No cervical adenopathy.  Skin:    General: Skin is warm and dry.  Neurological:     General: No focal deficit present.     Mental Status: He is alert.  Psychiatric:        Mood and Affect: Mood normal.        Behavior: Behavior normal.        Thought Content: Thought content normal.        Judgment: Judgment normal.       CBC    Component Value Date/Time   WBC 9.4 02/05/2022 1233   RBC 4.37 02/05/2022 1233   HGB 12.8 (L) 02/05/2022 1233   HCT 40.7 02/05/2022 1233   PLT 327 02/05/2022 1233   MCV 93.1 02/05/2022 1233   MCH  29.3 02/05/2022 1233   MCHC 31.4 02/05/2022 1233   RDW 14.4 02/05/2022 1233   LYMPHSABS 1.3 02/05/2022 1233   MONOABS 0.7 02/05/2022 1233   EOSABS 0.2 02/05/2022 1233   BASOSABS 0.0 02/05/2022 1233      Latest Ref Rng & Units 03/21/2022    9:20 AM 02/05/2022   12:33 PM 01/31/2022    1:30 PM  BMP  Glucose 70 - 99 mg/dL 107  115  90   BUN 6 - 20 mg/dL '17  17  18   '$ Creatinine 0.61 - 1.24 mg/dL 1.37  1.39  1.56   Sodium 135 - 145 mmol/L 141  141  141   Potassium 3.5 - 5.1 mmol/L 4.9  4.3  4.1   Chloride 98 - 111 mmol/L 110  106  108   CO2 22 - 32 mmol/L '27  20  26   '$ Calcium 8.9 - 10.3 mg/dL 9.2  9.6  9.0    Chest imaging: CXR 02/05/22 Stable cardiomediastinal silhouette. Both lungs are clear. The visualized skeletal structures are unremarkable.  PFT:     No data to display          Labs:  Path:  Echo 12/20/2021: EF <20%, Mild concentric LVH. RV function and size is normal. LA mildly dilated.   Heart Catheterization:  Assessment & Plan:   No diagnosis found.  Discussion:     Current Outpatient Medications:    apixaban (ELIQUIS) 5 MG TABS tablet, Take 1 tablet (5 mg total) by mouth 2 (two) times daily., Disp: 60 tablet, Rfl: 5   atorvastatin (LIPITOR) 40 MG tablet, Take 1 tablet (40 mg total) by mouth daily., Disp: 30 tablet, Rfl: 3   carvedilol (COREG) 3.125 MG tablet, Take 1 tablet (3.125 mg total) by mouth 2 (two) times daily., Disp: 60 tablet, Rfl: 3   clopidogrel (PLAVIX) 75 MG tablet, Take 1 tablet (75 mg total) by mouth daily., Disp: 30 tablet, Rfl: 6   digoxin (LANOXIN) 0.125 MG tablet, Take 1 tablet (0.125 mg total) by mouth daily., Disp: 30 tablet, Rfl: 6   empagliflozin (JARDIANCE) 10 MG TABS tablet, Take 1 tablet (10 mg total) by mouth daily., Disp: 30 tablet, Rfl: 6   famotidine (PEPCID) 20 MG tablet, Take 1 tablet (20 mg total) by mouth daily., Disp: 30 tablet, Rfl: 6   Glycerin-Hypromellose-PEG 400 0.2-0.2-1 % SOLN, Place 1 drop into both eyes as  needed. (Patient taking differently: Place 1 drop into both eyes as needed (itchy, redness).), Disp: 15 mL, Rfl: 0   isosorbide-hydrALAZINE (BIDIL) 20-37.5 MG tablet, Take 1 tablet by mouth 3 (three) times daily., Disp: 90 tablet, Rfl: 6   ivabradine (CORLANOR) 5 MG TABS tablet, Take 0.5 tablets (2.5 mg total) by mouth 2 (two) times daily with a meal., Disp: 30 tablet, Rfl: 6   meloxicam (MOBIC) 7.5 MG tablet, Take 1 tablet (7.5 mg total) by mouth daily., Disp: 30 tablet, Rfl: 0   sacubitril-valsartan (ENTRESTO) 49-51 MG, Take 1 tablet by mouth 2 (two) times daily., Disp: 60 tablet, Rfl: 3   triamcinolone ointment (KENALOG) 0.5 %, Apply 1 Application topically 2 (two) times daily., Disp: 60 g, Rfl: 2

## 2022-03-29 NOTE — Telephone Encounter (Signed)
H&V Care Navigation CSW Progress Note  Clinical Social Worker contacted patient by phone to discuss financial assistance with phone bill.  Patient is participating in a Managed Medicaid Plan:  Yes  CSW able to assist with one month of phone bill so he can remain in contact with clinic.  Reports he is now in hotel through [facility] awaiting placement in [place]: No Food Insecurity (09/19/2021)  Housing: High Risk (01/01/2022)  Transportation Needs: Unmet Transportation Needs (03/21/2022)  Alcohol Screen: High Risk (09/19/2021)  Depression (PHQ2-9): Low Risk  (03/05/2022)  Financial Resource Strain: High Risk (03/21/2022)  Tobacco Use: High Risk (03/21/2022)    No further needs at this time  Jorge Ny, [facility] Desk#: [phone] Cell#: [phone]

## 2022-03-30 ENCOUNTER — Telehealth (HOSPITAL_COMMUNITY): Payer: Self-pay | Admitting: Emergency Medicine

## 2022-03-30 ENCOUNTER — Other Ambulatory Visit (HOSPITAL_COMMUNITY): Payer: Self-pay | Admitting: Emergency Medicine

## 2022-03-30 DIAGNOSIS — R32 Unspecified urinary incontinence: Secondary | ICD-10-CM | POA: Diagnosis not present

## 2022-03-30 NOTE — Telephone Encounter (Signed)
Spoke with Craig Schmidt this morning and introduced myself.  Scheduled a home visit w/ med rec today @ 1:00.

## 2022-03-30 NOTE — Progress Notes (Unsigned)
Pt was a now show for this appointment

## 2022-04-02 ENCOUNTER — Other Ambulatory Visit (HOSPITAL_COMMUNITY): Payer: Self-pay | Admitting: Emergency Medicine

## 2022-04-02 ENCOUNTER — Other Ambulatory Visit: Payer: Self-pay | Admitting: Family

## 2022-04-02 ENCOUNTER — Telehealth (HOSPITAL_COMMUNITY): Payer: Self-pay | Admitting: Emergency Medicine

## 2022-04-02 ENCOUNTER — Ambulatory Visit: Payer: Medicaid Other | Admitting: Family

## 2022-04-02 DIAGNOSIS — L309 Dermatitis, unspecified: Secondary | ICD-10-CM

## 2022-04-02 NOTE — Progress Notes (Signed)
Paramedicine Encounter    Patient ID: Craig Schmidt, male    DOB: [birth year], 51 y.o.   MRN: [id number]   There were no vitals taken for this visit. Weight yesterday-not taken Last visit weight-not taken  ATF [name] A&O x 4, skin W&D w/ good color.  Pt. Denies chest pain or SOB.  Lung sounds clear and equal bilat.  No edema to lower extremities.  Pt's med box was filled by his girlfriend prior to my arrival.  I reviewed pill box for accuracy and everything was good.  Called in refill for Corlanor at [facility] and cancelled auto-refill on his meds with plans to move him to [facility] as they offer free delivery.  Pt has an upcoming appointment on Wednesday with Dr, Aundra Dubin.  He does have arrangements for transportation at this time.  Will do home visit weekly.  Discusseded providing him with nutritional information at his clinic visit. Home visit complete.    [name], [place] 04/02/2022   Patient Care Team: [name], NP as PCP - General (Nurse Practitioner) Janina Mayo, MD as PCP - Cardiology (Cardiology)  Patient Active Problem List   Diagnosis Date Noted  . Chronic combined systolic and diastolic heart failure ([place])   . ST elevation myocardial infarction (STEMI) of inferolateral wall, subsequent episode of care ([place]) 12/19/2021  . ST elevation myocardial infarction (STEMI) ([place])   . Ischemic cardiomyopathy   . Cocaine abuse ([place])   . Smoker   . Alcohol abuse   . Acute ischemic stroke ([place]) 09/08/2021  . 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 Tear of MCL (medial collateral ligament) of knee 05/20/2015  . Protein-calorie malnutrition, severe 05/17/2015  . Lactic acidosis 05/15/2015  . Left knee pain 05/15/2015  . Lung nodules 05/15/2015  . Hypoglycemia 05/14/2015  . Hypothermia 05/14/2015  . Acute encephalopathy 05/14/2015  . Cocaine abuse with intoxication ([place]) 05/14/2015  . Alcohol intoxication in active alcoholic  ([place]) 41/66/0630  . Sepsis ([place]) 05/14/2015  . Homeless 05/14/2015    Current Outpatient Medications:  .  apixaban (ELIQUIS) 5 MG TABS tablet, Take 1 tablet (5 mg total) by mouth 2 (two) times daily., Disp: 60 tablet, Rfl: 5 .  atorvastatin (LIPITOR) 40 MG tablet, Take 1 tablet (40 mg total) by mouth daily., Disp: 30 tablet, Rfl: 3 .  carvedilol (COREG) 3.125 MG tablet, Take 1 tablet (3.125 mg total) by mouth 2 (two) times daily., Disp: 60 tablet, Rfl: 3 .  clopidogrel (PLAVIX) 75 MG tablet, Take 1 tablet (75 mg total) by mouth daily., Disp: 30 tablet, Rfl: 6 .  digoxin (LANOXIN) 0.125 MG tablet, Take 1 tablet (0.125 mg total) by mouth daily., Disp: 30 tablet, Rfl: 6 .  empagliflozin (JARDIANCE) 10 MG TABS tablet, Take 1 tablet (10 mg total) by mouth daily., Disp: 30 tablet, Rfl: 6 .  famotidine (PEPCID) 20 MG tablet, Take 1 tablet (20 mg total) by mouth daily., Disp: 30 tablet, Rfl: 6 .  Glycerin-Hypromellose-PEG 400 0.2-0.2-1 % SOLN, Place 1 drop into both eyes as needed. (Patient taking differently: Place 1 drop into both eyes as needed (itchy, redness).), Disp: 15 mL, Rfl: 0 .  isosorbide-hydrALAZINE (BIDIL) 20-37.5 MG tablet, Take 1 tablet by mouth 3 (three) times daily., Disp: 90 tablet, Rfl: 6 .  ivabradine (CORLANOR) 5 MG TABS tablet, Take 0.5 tablets (2.5 mg total) by mouth 2 (two) times daily with a meal., Disp: 30 tablet, Rfl: 6 .  meloxicam (MOBIC) 7.5 MG tablet, Take  1 tablet (7.5 mg total) by mouth daily., Disp: 30 tablet, Rfl: 0 .  sacubitril-valsartan (ENTRESTO) 49-51 MG, Take 1 tablet by mouth 2 (two) times daily., Disp: 60 tablet, Rfl: 3 .  triamcinolone ointment (KENALOG) 0.5 %, Apply 1 Application topically 2 (two) times daily., Disp: 60 g, Rfl: 2 Allergies  Allergen Reactions  . Shellfish Allergy Anaphylaxis  . Peanut-Containing Drug Products Itching      Social History   Socioeconomic History  . Marital status: Significant Other    Spouse name: Not on file  .  Number of children: 7  . Years of education: Not on file  . Highest education level: 9th grade  Occupational History  . Occupation: unemployed    Comment: experiencing homelessness-living at daily rent motels.  Tobacco Use  . Smoking status: Every Day    Packs/day: 1.00    Years: 41.00    Total pack years: 41.00    Types: Cigarettes    Passive exposure: Current  . Smokeless tobacco: Never  Vaping Use  . Vaping Use: Never used  Substance and Sexual Activity  . Alcohol use: Yes    Alcohol/week: 63.0 standard drinks of alcohol    Types: 63 Cans of beer per week    Comment: 3-40oz beer/day x10 years. 1-2 40oz beer/day in 20s/30s  . Drug use: Yes    Types: "Crack" cocaine, Cocaine    Comment: last use 01/13/22  . Sexual activity: Not on file  Other Topics Concern  . Not on file  Social History Narrative  . Not on file   Social Determinants of Health   Financial Resource Strain: High Risk (03/21/2022)   Overall Financial Resource Strain (CARDIA)   . Difficulty of Paying Living Expenses: Very hard  Food Insecurity: No Food Insecurity (09/19/2021)   Hunger Vital Sign   . Worried About Charity fundraiser in the Last Year: Never true   . Ran Out of Food in the Last Year: Never true  Transportation Needs: Unmet Transportation Needs (03/21/2022)   PRAPARE - Transportation   . Lack of Transportation (Medical): Yes   . Lack of Transportation (Non-Medical): Yes  Physical Activity: Not on file  Stress: Not on file  Social Connections: Not on file  Intimate Partner Violence: Not on file    Physical Exam      Future Appointments  Date Time Provider [place]  04/03/2022 10:15 AM Delton Prairie, OT [facility]  04/04/2022  9:15 AM Marybelle Killings, MD [facility] None  04/04/2022 10:10 AM MC ECHO/CH OP [facility]  04/04/2022 11:20 AM [name], MD [facility] None  04/05/2022 11:00 AM Hans Eden, OT [facility]  04/10/2022 10:15 AM Delton Prairie, OT [facility]  [facility]  04/12/2022 10:15 AM Delton Prairie, OT [facility]  04/17/2022 10:15 AM Hans Eden, OT [facility]  04/18/2022 10:00 AM Janina Mayo, MD [facility] None  04/18/2022  1:20 PM [name], NP PCE-PCE None  04/19/2022 10:15 AM Delton Prairie, OT [facility]  04/26/2022  8:30 AM Garvin Fila, MD GNA[facility] None       [name], [place] [facility] Paramedic  04/02/22

## 2022-04-02 NOTE — Telephone Encounter (Signed)
Called to follow-up with Craig Schmidt from last weeks no-show visit.  We have rescheduled a home visit for today @ 4:00.

## 2022-04-02 NOTE — Telephone Encounter (Signed)
Order complete. 

## 2022-04-03 ENCOUNTER — Encounter: Payer: Self-pay | Admitting: Occupational Therapy

## 2022-04-03 ENCOUNTER — Ambulatory Visit: Payer: Medicaid Other | Attending: Family | Admitting: Occupational Therapy

## 2022-04-03 ENCOUNTER — Other Ambulatory Visit (HOSPITAL_COMMUNITY): Payer: Self-pay | Admitting: Emergency Medicine

## 2022-04-03 DIAGNOSIS — I5043 Acute on chronic combined systolic (congestive) and diastolic (congestive) heart failure: Secondary | ICD-10-CM | POA: Diagnosis not present

## 2022-04-03 DIAGNOSIS — I213 ST elevation (STEMI) myocardial infarction of unspecified site: Secondary | ICD-10-CM | POA: Diagnosis not present

## 2022-04-03 DIAGNOSIS — I69351 Hemiplegia and hemiparesis following cerebral infarction affecting right dominant side: Secondary | ICD-10-CM | POA: Diagnosis not present

## 2022-04-03 NOTE — Progress Notes (Signed)
Picked up pt's Corlanor prescription from [facility] and dropped it off to Mr Newhall at 16:45.  This prevented gap in care.    [name], [place] 04/03/2022

## 2022-04-03 NOTE — Therapy (Addendum)
OUTPATIENT OCCUPATIONAL THERAPY NEURO EVALUATION  Patient Name: Craig Schmidt MRN: [id number] DOB:Jul 30, 1970, 51 y.o., male Today's Date: 04/03/2022  PCP: [name], NP REFERRING PROVIDER: [name], NP    OT End of Session - 04/03/22 1025     Visit Number 2    Number of Visits 13    Date for OT Re-Evaluation 04/19/22    Authorization Type Medicaid [employer] (no e-stim, only 1 tx diagnosis)--approved 5 visits 04/03/22-06/01/22   OT Start Time 1022    OT Stop Time 1100    OT Time Calculation (min) 38 min    Activity Tolerance Patient tolerated treatment well    Behavior During Therapy WFL for tasks assessed/performed              Past Medical History:  Diagnosis Date   Asthma    Brain tumor ([place])    MI (myocardial infarction) ([place])    Stroke [facility])    Past Surgical History:  Procedure Laterality Date   BUBBLE STUDY  09/11/2021   Procedure: BUBBLE STUDY;  Surgeon: Fay Records, MD;  Location: [place];  Service: Cardiovascular;;   LEFT HEART CATH AND CORONARY ANGIOGRAPHY N/A 12/19/2021   Procedure: LEFT HEART CATH AND CORONARY ANGIOGRAPHY;  Surgeon: Belva Crome, MD;  Location: [place] CV LAB;  Service: Cardiovascular;  Laterality: N/A;   TEE WITHOUT CARDIOVERSION N/A 09/11/2021   Procedure: TRANSESOPHAGEAL ECHOCARDIOGRAM (TEE);  Surgeon: Fay Records, MD;  Location: [facility] ENDOSCOPY;  Service: Cardiovascular;  Laterality: N/A;   Patient Active Problem List   Diagnosis Date Noted   Chronic combined systolic and diastolic heart failure (HCC)    ST elevation myocardial infarction (STEMI) of inferolateral wall, subsequent episode of care ([place]) 12/19/2021   ST elevation myocardial infarction (STEMI) ([place])    Ischemic cardiomyopathy    Cocaine abuse ([place])    Smoker    Alcohol abuse    Acute ischemic stroke ([place]) 09/08/2021   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/2015   Tear of MCL (medial collateral ligament) of knee 05/20/2015    Protein-calorie malnutrition, severe 05/17/2015   Lactic acidosis 05/15/2015   Left knee pain 05/15/2015   Lung nodules 05/15/2015   Hypoglycemia 05/14/2015   Hypothermia 05/14/2015   Acute encephalopathy 05/14/2015   Cocaine abuse with intoxication ([place]) 05/14/2015   Alcohol intoxication in active alcoholic ([place]) 71/69/6789   Sepsis ([place]) 05/14/2015   Homeless 05/14/2015    ONSET DATE: 12/19/21  REFERRING DIAG: F81.017 (ICD-10-CM) - RUE weakness   THERAPY DIAG:  Hemiplegia and hemiparesis following cerebral infarction affecting right dominant side ([place])  Rationale for Evaluation and Treatment Rehabilitation  SUBJECTIVE:   SUBJECTIVE STATEMENT: Pt reports RUE pain fluctuates but significant   Pt accompanied by: self  PERTINENT HISTORY: 51 y.o. male presented to [facility] hospital on 12/19/2021 with chest pain. EKG with ST elevation in ED. Pt underwent cardiac cath on 5/30, demonstrating occlusion of apical LAD, severe systolic dysfunction with EF less than 25%. Pt refusing right heart cath 6/2.   PMH includes:  CVA March 2023 with multiple infarcts, hx of substance abuse, systolic heart failure, mitral stenosis, L vestibular schwannoma.  PRECAUTIONS: Fall  WEIGHT BEARING RESTRICTIONS No  PAIN:  Are you having pain? Yes: NPRS scale: 8/10 Pain location: R shoulder Pain description: sharp, numb Aggravating factors: malpositioning Relieving factors: improved positioning  FALLS: Has patient fallen in last 6 months? Yes. 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   Discussed that PT tried adjusting Giv Mohr sling, with no pain relief and then sent request for pt to obtain regular sling.  Recommended pt follow up with PCP regarding sling and possible rehab/pain management referral for R shoulder pain.     PATIENT EDUCATION: Education details: HEP for gentle ROM--see pt instructions.  Recommended avoiding keeping shoulder in IR as much as possible.   Person educated: Patient Education method: Explanation, Demonstration, Verbal cues, and Handouts Education comprehension: verbalized understanding, returned demonstration, verbal cues required, and needs further education   HOME EXERCISE PROGRAM: 02/27/22:  reach and grasp/release empty bottle and bottle caps; Proper positioning of RUE in sitting and bed (avoiding IR) 04/03/22:  HEP for gentle ROM--see pt instructions.  Recommended avoiding keeping shoulder in IR as much as possible.     GOALS: Potential Goals reviewed with patient? Yes  SHORT TERM GOALS: Target date: 03/29/22  Pt will be independent with initial HEP for RUE. Baseline:  no HEP Goal status: INITIAL  2.  Pt will verbalize understanding of proper positioning of RUE to decr pain and prevent injury. Baseline:  pt positioned in flexor synergy pattern with pain Goal status: INITIAL  3.  Pt will demo at least 90* R shoulder flex without pain for functional reach. Baseline: 80* with pain Goal status: INITIAL  4.  Pt will report consistently using RUE as a stabilizer for functional tasks. Baseline:  pt not currently using RUE functionally Goal status: INITIAL  5.  Pt will be independent with HEP for vision/vision compensation strategies. Baseline:  pt reports diplopia, no HEP, and  concerns regarding crossing street safely Goal status: INITIAL   LONG TERM GOALS: Target date: 04/19/22  Pt will be independent with updated HEP for RUE. Baseline:  no HEP Goal status: INITIAL  2.  Pt will demo at least 100* R shoulder flex without pain for functional reach. Baseline: 80* with pain Goal status: INITIAL  3.  Pt will use RUE for gross assist for bilateral ADL tasks. Baseline: not using RUE functionally Goal status: INITIAL  4.  Pt will improve RUE coordination/functional reaching as shown by improving score on box and blocks test by at least 3 blocks. Baseline:  3 blocks Goal status: INITIAL  5.  Pt will perform environmental scanning/navigation in busy environment with at least 90% accuracy for incr safety. Baseline:  pt reports diplopia (particularly when making quick head/eye movements, like when crossing the street) and bumping into items   Goal status: INITIAL   ASSESSMENT:  CLINICAL IMPRESSION:   Pt is progressing slowly towards goals and verbalized understanding of initial HEP.  Pt hypersensitive to touch RUE, but reports less intense/sharp pain in RUE at end of session.  PERFORMANCE DEFICITS in functional skills including ADLs, IADLs, coordination, dexterity, sensation, tone, ROM, pain, FMC, GMC, mobility, balance, decreased knowledge of use of DME, vision, and UE functional use,  cognitive skills including memory and safety awareness, and psychosocial skills including environmental adaptation and routines and behaviors.   IMPAIRMENTS are limiting patient from ADLs, IADLs, work, and leisure.   COMORBIDITIES may have co-morbidities  that affects occupational performance. Patient will benefit from skilled OT to address above impairments and improve overall function.  MODIFICATION OR ASSISTANCE TO COMPLETE EVALUATION: Min-Moderate modification of tasks or assist with assess necessary to complete an evaluation.  OT OCCUPATIONAL PROFILE AND HISTORY: Detailed  assessment: Review of records and additional review of physical, cognitive, psychosocial history related to current functional performance.  CLINICAL DECISION MAKING: Moderate - several treatment options, min-mod task modification necessary  REHAB POTENTIAL: Good  EVALUATION COMPLEXITY: Moderate   PLAN: OT FREQUENCY: 2x/week  OT DURATION: 6 weeks +eval  PLANNED INTERVENTIONS: self care/ADL training, therapeutic exercise, therapeutic activity, neuromuscular re-education, manual therapy, passive range of motion, balance training, functional mobility training, splinting, ultrasound, paraffin, fluidotherapy, moist heat, cryotherapy, patient/family education, cognitive remediation/compensation, visual/perceptual remediation/compensation, and DME and/or AE instructions  RECOMMENDED OTHER SERVICES: Pt is current with PT and is working with Education officer, museum.  No additional recommendations at this time  CONSULTED AND AGREED WITH PLAN OF CARE: Patient  PLAN FOR NEXT SESSION: review initial HEP, continue with proper positioning as able, gentle AAROM as tolerated       Managed medicaid CPT codes: 57972- Therapeutic Exercise, [phone]- Neuro Re-education, 97140 - Manual Therapy, 97530 - Therapeutic Activities, [phone] - Self Care, [phone] - Ultrasound, [phone] - Orthotic Fit, [id number] - Fluidotherapy, and [id number] -  Paraffin  Functional Outcome measure:  Box & Blocks Test:  3 blocks with RUE    Alfredia Desanctis, OTR/L 04/03/2022, 2:35 PM

## 2022-04-03 NOTE — Patient Instructions (Signed)
    PERFORM ALL SLOWLY AND ONLY AS FAR AS YOU CAN WITHOUT PAIN!      Table Top Stretch (Static)    Sitting at table, slide arm across table WITH HANDS CLASPED/FINGERS LACED. Hold 5 seconds. Repeat 10 times then perform to right side and then to middle, and in circles. Do 2 sessions per day.   Elbow: Extension    Clasp hands (FINGERS LACED)  and then lean over to stretch down/out elbow between legs  Hold 15 seconds. Repeat 10 times. Do 2 sessions per day. CAUTION: Stretch slowly and gently. Do not force joint.   Scapular Retraction (Standing)    SIT With arms at sides (RIGHT ARM ON PILLOW) LIFT CHEST/HEAD SO THAT  shoulder blades come together. Repeat 10 times per set. Do 2 sessions per day.  Supination (Passive)    Keep elbow bent at right angle and held firmly at side. Use other hand to turn forearm until palm faces upward. Hold 5 seconds. Repeat 10 times. Do 2 sessions per day.  Scapular Retraction: Elbow Flexion (Standing)    With elbows bent to 90,CLASP HANDS TOGETHER AND BRING HANDS TO YOUR BODY AND THEN AWAY FROM YOUR BODY.  HEAD UP Repeat 10 times per set. Do 2 sessions per day.

## 2022-04-04 ENCOUNTER — Other Ambulatory Visit (HOSPITAL_COMMUNITY): Payer: Self-pay | Admitting: Emergency Medicine

## 2022-04-04 ENCOUNTER — Ambulatory Visit: Payer: Medicaid Other | Admitting: Orthopaedic Surgery

## 2022-04-04 ENCOUNTER — Other Ambulatory Visit (HOSPITAL_COMMUNITY): Payer: Self-pay

## 2022-04-04 ENCOUNTER — Encounter (HOSPITAL_COMMUNITY): Payer: Self-pay | Admitting: Cardiology

## 2022-04-04 ENCOUNTER — Other Ambulatory Visit: Payer: Self-pay

## 2022-04-04 ENCOUNTER — Ambulatory Visit (HOSPITAL_COMMUNITY)
Admission: RE | Admit: 2022-04-04 | Discharge: 2022-04-04 | Disposition: A | Discharge status: Home / Self Care | Payer: Medicaid Other | Source: Ambulatory Visit | Attending: Cardiology | Admitting: Cardiology

## 2022-04-04 ENCOUNTER — Ambulatory Visit (HOSPITAL_BASED_OUTPATIENT_CLINIC_OR_DEPARTMENT_OTHER)
Admission: RE | Admit: 2022-04-04 | Discharge: 2022-04-04 | Disposition: A | Discharge status: Home / Self Care | Payer: Medicaid Other | Source: Ambulatory Visit | Attending: Cardiology | Admitting: Cardiology

## 2022-04-04 VITALS — BP 120/70 | HR 76 | Wt 168.0 lb

## 2022-04-04 DIAGNOSIS — I2510 Atherosclerotic heart disease of native coronary artery without angina pectoris: Secondary | ICD-10-CM | POA: Insufficient documentation

## 2022-04-04 DIAGNOSIS — I110 Hypertensive heart disease with heart failure: Secondary | ICD-10-CM | POA: Insufficient documentation

## 2022-04-04 DIAGNOSIS — Z86018 Personal history of other benign neoplasm: Secondary | ICD-10-CM | POA: Insufficient documentation

## 2022-04-04 DIAGNOSIS — I5022 Chronic systolic (congestive) heart failure: Secondary | ICD-10-CM | POA: Insufficient documentation

## 2022-04-04 DIAGNOSIS — Z7984 Long term (current) use of oral hypoglycemic drugs: Secondary | ICD-10-CM | POA: Diagnosis not present

## 2022-04-04 DIAGNOSIS — I5042 Chronic combined systolic (congestive) and diastolic (congestive) heart failure: Secondary | ICD-10-CM

## 2022-04-04 DIAGNOSIS — F1410 Cocaine abuse, uncomplicated: Secondary | ICD-10-CM | POA: Diagnosis not present

## 2022-04-04 DIAGNOSIS — I252 Old myocardial infarction: Secondary | ICD-10-CM | POA: Diagnosis present

## 2022-04-04 DIAGNOSIS — R531 Weakness: Secondary | ICD-10-CM | POA: Diagnosis not present

## 2022-04-04 DIAGNOSIS — I052 Rheumatic mitral stenosis with insufficiency: Secondary | ICD-10-CM | POA: Insufficient documentation

## 2022-04-04 DIAGNOSIS — Z7901 Long term (current) use of anticoagulants: Secondary | ICD-10-CM | POA: Insufficient documentation

## 2022-04-04 DIAGNOSIS — R42 Dizziness and giddiness: Secondary | ICD-10-CM | POA: Insufficient documentation

## 2022-04-04 DIAGNOSIS — Z7902 Long term (current) use of antithrombotics/antiplatelets: Secondary | ICD-10-CM | POA: Diagnosis not present

## 2022-04-04 DIAGNOSIS — Z5900 Homelessness unspecified: Secondary | ICD-10-CM | POA: Diagnosis not present

## 2022-04-04 DIAGNOSIS — I69351 Hemiplegia and hemiparesis following cerebral infarction affecting right dominant side: Secondary | ICD-10-CM | POA: Insufficient documentation

## 2022-04-04 DIAGNOSIS — I050 Rheumatic mitral stenosis: Secondary | ICD-10-CM | POA: Diagnosis not present

## 2022-04-04 DIAGNOSIS — Z79899 Other long term (current) drug therapy: Secondary | ICD-10-CM | POA: Diagnosis not present

## 2022-04-04 LAB — BASIC METABOLIC PANEL
Anion gap: 7 (ref 5–15)
BUN: 19 mg/dL (ref 6–20)
CO2: 25 mmol/L (ref 22–32)
Calcium: 8.9 mg/dL (ref 8.9–10.3)
Chloride: 107 mmol/L (ref 98–111)
Creatinine, Ser: 1.28 mg/dL — ABNORMAL HIGH (ref 0.61–1.24)
GFR, Estimated: >60 mL/min (ref >60)
Glucose, Bld: 101 mg/dL — ABNORMAL HIGH (ref 70–99)
Potassium: 4.1 mmol/L (ref 3.5–5.1)
Sodium: 139 mmol/L (ref 135–145)

## 2022-04-04 LAB — BRAIN NATRIURETIC PEPTIDE: B Natriuretic Peptide: 572 pg/mL — ABNORMAL HIGH (ref 0.0–100.0)

## 2022-04-04 LAB — ECHOCARDIOGRAM COMPLETE
Area-P 1/2: 3.1 cm2
MV M vel: 5.23 m/s
MV Peak grad: 109.4 mmHg
MV VTI: 1.66 cm2
S' Lateral: 3.9 cm

## 2022-04-04 LAB — DIGOXIN LEVEL: Digoxin Level: 0.3 ng/mL — ABNORMAL LOW (ref 0.8–2.0)

## 2022-04-04 MED ORDER — CARVEDILOL 6.25 MG PO TABS: 6.2500 mg | ORAL_TABLET | Freq: Two times a day (BID) | ORAL | 3 refills | Status: DC

## 2022-04-04 MED ORDER — SPIRONOLACTONE 25 MG PO TABS: 12.5000 mg | ORAL_TABLET | Freq: Every day | ORAL | 3 refills | Status: DC

## 2022-04-04 NOTE — Patient Instructions (Signed)
INCREASE Carvedilol to 6.25 mg Twice daily  START Spironolactone 12.5 mg ( 1/2 Tab) daily.  STOP Colanor  Labs done today, your results will be available in MyChart, we will contact you for abnormal readings.  Repeat blood work in 10 days  You are scheduled for a Cardiac Catheterization on Friday, September 22 with Dr. Loralie Champagne.  1. Please arrive at the [facility] (Main Entrance A) at [facility]: [address] [place], [place] [address] at 7:00 AM (This time is two hours before your procedure to ensure your preparation). Free valet parking service is available.   Special note: Every effort is made to have your procedure done on time. Please understand that emergencies sometimes delay scheduled procedures.  2. Diet: Do not eat solid foods after midnight.  The patient may have clear liquids until 5am upon the day of the procedure.   3. Medication instructions in preparation for your procedure:   Contrast Allergy: No  Stop taking Eliquis (Apixiban) on Thursday, September 21.  Stop taking, Spironolactone  Friday, September 22,   Hold Jardiance  Friday, September 22,  On the morning of your procedure, take your Plavix/Clopidogrel and any morning medicines NOT listed above.  You may use sips of water.  5. Plan for one night stay--bring personal belongings. 6. Bring a current list of your medications and current insurance cards. 7. You MUST have a responsible person to drive you home. 8. Someone MUST be with you the first 24 hours after you arrive home or your discharge will be delayed. 9. Please wear clothes that are easy to get on and off and wear slip-on shoes.   Your physician recommends that you schedule a follow-up appointment in: 4 weeks  If you have any questions or concerns before your next appointment please send [place] a message through [place] or call our office at [phone].    TO LEAVE A MESSAGE FOR THE NURSE SELECT OPTION 2, PLEASE LEAVE A MESSAGE  INCLUDING: YOUR NAME DATE OF BIRTH CALL BACK NUMBER REASON FOR CALL**this is important as we prioritize the call backs  YOU WILL RECEIVE A CALL BACK THE SAME DAY AS LONG AS YOU CALL BEFORE 4:00 PM  At the [facility], you and your health needs are our priority. As part of our continuing mission to provide you with exceptional heart care, we have created designated Provider Care Teams. These Care Teams include your primary Cardiologist (physician) and Advanced Practice Providers (APPs- Physician Assistants and Nurse Practitioners) who all work together to provide you with the care you need, when you need it.   You may see any of the following providers on your designated Care Team at your next follow up: Dr Glori Bickers Dr Loralie Champagne Dr. Roxana Hires, NP [name], [place] [facility], [place] Forestine Na, NP Audry Riles, PharmD   Please be sure to bring in all your medications bottles to every appointment.

## 2022-04-04 NOTE — Progress Notes (Deleted)
Patient ID: Craig Schmidt, male    DOB: 1971-04-18  MRN: [id number]  CC:  Subjective: Craig Schmidt is a 51 y.o. male who presents for unsure today's appt   His concerns today include:  Referred to Ortho 03/05/2022 and has appt scheduled 04/04/2022 for back pain Psych - hx PTSD, mood swings Derm - eczema Cards - HF, CAD, mitral stenosis, ventricular trigeminy, vestibular schwannoma  Uro - urinary incontinence Pulmo - hx asthma, smoker PT  Referred to Ophthalmology Referred to Dentistry   Patient Active Problem List   Diagnosis Date Noted   Chronic combined systolic and diastolic heart failure (HCC)    ST elevation myocardial infarction (STEMI) of inferolateral wall, subsequent episode of care ([place]) 12/19/2021   ST elevation myocardial infarction (STEMI) ([place])    Ischemic cardiomyopathy    Cocaine abuse ([place])    Smoker    Alcohol abuse    Acute ischemic stroke ([place]) 09/08/2021   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[place]) 05/20/2015   Tear of MCL (medial collateral ligament) of knee 05/20/2015   Protein-calorie malnutrition, severe 05/17/2015   Lactic acidosis 05/15/2015   Left knee pain 05/15/2015   Lung nodules 05/15/2015   Hypoglycemia 05/14/2015   Hypothermia 05/14/2015   Acute encephalopathy 05/14/2015   Cocaine abuse with intoxication ([place]) 05/14/2015   Alcohol intoxication in active alcoholic ([place]) 28/36/6294   Sepsis ([place]) 05/14/2015   Homeless 05/14/2015     Current Outpatient Medications on File Prior to Visit  Medication Sig Dispense Refill   apixaban (ELIQUIS) 5 MG TABS tablet Take 1 tablet (5 mg total) by mouth 2 (two) times daily. 60 tablet 5   atorvastatin (LIPITOR) 40 MG tablet Take 1 tablet (40 mg total) by mouth daily. 30 tablet 3   carvedilol (COREG) 3.125 MG tablet Take 1 tablet (3.125 mg total) by mouth 2 (two) times daily. 60 tablet 3   clopidogrel (PLAVIX) 75 MG tablet Take 1 tablet (75 mg total) by mouth daily. 30 tablet 6   digoxin  (LANOXIN) 0.125 MG tablet Take 1 tablet (0.125 mg total) by mouth daily. 30 tablet 6   empagliflozin (JARDIANCE) 10 MG TABS tablet Take 1 tablet (10 mg total) by mouth daily. 30 tablet 6   famotidine (PEPCID) 20 MG tablet Take 1 tablet (20 mg total) by mouth daily. 30 tablet 6   Glycerin-Hypromellose-PEG 400 0.2-0.2-1 % SOLN Place 1 drop into both eyes as needed. (Patient taking differently: Place 1 drop into both eyes as needed (itchy, redness).) 15 mL 0   isosorbide-hydrALAZINE (BIDIL) 20-37.5 MG tablet Take 1 tablet by mouth 3 (three) times daily. 90 tablet 6   ivabradine (CORLANOR) 5 MG TABS tablet Take 0.5 tablets (2.5 mg total) by mouth 2 (two) times daily with a meal. 30 tablet 6   meloxicam (MOBIC) 7.5 MG tablet Take 1 tablet (7.5 mg total) by mouth daily. 30 tablet 0   sacubitril-valsartan (ENTRESTO) 49-51 MG Take 1 tablet by mouth 2 (two) times daily. 60 tablet 3   triamcinolone ointment (KENALOG) 0.5 % APPLY TOPICALLY TO THE AFFECTED AREA TWICE DAILY 60 g 2   No current facility-administered medications on file prior to visit.    Allergies  Allergen Reactions   Shellfish Allergy Anaphylaxis   Peanut-Containing Drug Products Itching    Social History   Socioeconomic History   Marital status: Significant Other    Spouse name: Not on file   Number of children: 7   Years of education: Not on file  Highest education level: 9th grade  Occupational History   Occupation: unemployed    Comment: experiencing homelessness-living at daily rent motels.  Tobacco Use   Smoking status: Every Day    Packs/day: 1.00    Years: 41.00    Total pack years: 41.00    Types: Cigarettes    Passive exposure: Current   Smokeless tobacco: Never  Vaping Use   Vaping Use: Never used  Substance and Sexual Activity   Alcohol use: Yes    Alcohol/week: 63.0 standard drinks of alcohol    Types: 63 Cans of beer per week    Comment: 3-40oz beer/day x10 years. 1-2 40oz beer/day in 20s/30s   Drug  use: Yes    Types: "Crack" cocaine, Cocaine    Comment: last use 01/13/22   Sexual activity: Not on file  Other Topics Concern   Not on file  Social History Narrative   Not on file   Social Determinants of Health   Financial Resource Strain: High Risk (03/21/2022)   Overall Financial Resource Strain (CARDIA)    Difficulty of Paying Living Expenses: Very hard  Food Insecurity: No Food Insecurity (09/19/2021)   Hunger Vital Sign    Worried About Running Out of Food in the Last Year: Never true    Ran Out of Food in the Last Year: Never true  Transportation Needs: Unmet Transportation Needs (03/21/2022)   PRAPARE - Hydrologist (Medical): Yes    Lack of Transportation (Non-Medical): Yes  Physical Activity: Not on file  Stress: Not on file  Social Connections: Not on file  Intimate Partner Violence: Not on file    No family history on file.  Past Surgical History:  Procedure Laterality Date   BUBBLE STUDY  09/11/2021   Procedure: BUBBLE STUDY;  Surgeon: Fay Records, MD;  Location: [place];  Service: Cardiovascular;;   LEFT HEART CATH AND CORONARY ANGIOGRAPHY N/A 12/19/2021   Procedure: LEFT HEART CATH AND CORONARY ANGIOGRAPHY;  Surgeon: Belva Crome, MD;  Location: [place] CV LAB;  Service: Cardiovascular;  Laterality: N/A;   TEE WITHOUT CARDIOVERSION N/A 09/11/2021   Procedure: TRANSESOPHAGEAL ECHOCARDIOGRAM (TEE);  Surgeon: Fay Records, MD;  Location: [facility] ENDOSCOPY;  Service: Cardiovascular;  Laterality: N/A;    ROS: Review of Systems Negative except as stated above  PHYSICAL EXAM: There were no vitals taken for this visit.  Physical Exam  {male adult master:310786} {male adult master:310785}     Latest Ref Rng & Units 03/21/2022    9:20 AM 02/05/2022   12:33 PM 01/31/2022    1:30 PM  CMP  Glucose 70 - 99 mg/dL 107  115  90   BUN 6 - 20 mg/dL '17  17  18   '$ Creatinine 0.61 - 1.24 mg/dL 1.37  1.39  1.56   Sodium 135 - 145  mmol/L 141  141  141   Potassium 3.5 - 5.1 mmol/L 4.9  4.3  4.1   Chloride 98 - 111 mmol/L 110  106  108   CO2 22 - 32 mmol/L '27  20  26   '$ Calcium 8.9 - 10.3 mg/dL 9.2  9.6  9.0   Total Protein 6.5 - 8.1 g/dL  7.0    Total Bilirubin 0.3 - 1.2 mg/dL  0.2    Alkaline Phos 38 - 126 U/L  85    AST 15 - 41 U/L  32    ALT 0 - 44 U/L  10  Lipid Panel     Component Value Date/Time   CHOL 166 12/19/2021 1727   TRIG 48 12/19/2021 1727   HDL 61 12/19/2021 1727   CHOLHDL 2.7 12/19/2021 1727   VLDL 10 12/19/2021 1727   LDLCALC 95 12/19/2021 1727    CBC    Component Value Date/Time   WBC 9.4 02/05/2022 1233   RBC 4.37 02/05/2022 1233   HGB 12.8 (L) 02/05/2022 1233   HCT 40.7 02/05/2022 1233   PLT 327 02/05/2022 1233   MCV 93.1 02/05/2022 1233   MCH 29.3 02/05/2022 1233   MCHC 31.4 02/05/2022 1233   RDW 14.4 02/05/2022 1233   LYMPHSABS 1.3 02/05/2022 1233   MONOABS 0.7 02/05/2022 1233   EOSABS 0.2 02/05/2022 1233   BASOSABS 0.0 02/05/2022 1233    ASSESSMENT AND PLAN:  There are no diagnoses linked to this encounter.   Patient was given the opportunity to ask questions.  Patient verbalized understanding of the plan and was able to repeat key elements of the plan. Patient was given clear instructions to go to Emergency Department or return to medical center if symptoms don't improve, worsen, or new problems develop.The patient verbalized understanding.   No orders of the defined types were placed in this encounter.    Requested Prescriptions    No prescriptions requested or ordered in this encounter    No follow-ups on file.  [name], NP

## 2022-04-04 NOTE — Progress Notes (Signed)
H&V Care Navigation CSW Progress Note  Clinical Social Worker received call from pt stating that his ride through [place] did not come.  CSW able to arrange taxi to pick pt up and bring to appt.  Patient is participating in a Managed Medicaid Plan:  Yes  [place]: No Food Insecurity (09/19/2021)  Housing: High Risk (01/01/2022)  Transportation Needs: Unmet Transportation Needs (03/21/2022)  Alcohol Screen: High Risk (09/19/2021)  Depression (PHQ2-9): Low Risk  (03/05/2022)  Financial Resource Strain: High Risk (03/21/2022)  Tobacco Use: High Risk (04/04/2022)    Craig Schmidt, [facility] Desk#: [phone] Cell#: [phone]

## 2022-04-04 NOTE — H&P (View-Only) (Signed)
Advanced Heart Failure Clinic Note   Primary Care: [name], NP HF Cardiologist: Dr. Aundra Dubin  HPI: Craig Schmidt is a 51 y.o. 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   Admitted 02/23 with left MCA CVA s/p tenecteplase. Stroke felt to be likely secondary to cardiomyopathy. Echo EF 25-30%,k RV mildly reduced, RVSP 50 mmHg, moderate MS, mild to moderate MR, moderate to severe TR, dilated IVC. TEE 02/23: EF 25-30%, RV severely reduced, MV stenotic (? Parachute mitral valve), MVA 1.45 cm2 consistent with severe MS, moderate MR, no interatrial shunt, no LV or LAA thrombus. Cardiology consulted. CM likely d/t subtance abuse +/- uncontrolled HTN. Had been started on coumadin but later switched to eliquis d/t concern for compliance given his social situation.  Admitted 5/23 with acute inferolateral STEMI. Had used cocaine 24 hrs prior to presentation. Emergent LHC with 100% apical LAD suspected embolic vs plaque rupture with thrombosis. LVEDP 40. Echo this admit showed EF < 20%, ? RV okay. He was significantly volume overloaded and hypertensive. Started on lasix gtt and nitro gtt. Had good diuresis but Scr began to trend up, 1.5>1.77>1.87>2.0.  Developed soft BP and BP-active meds held. There was concern for low-output. PICC placed and Co-ox within normal range. Drips weaned and GDMT started. He was discharged home, weight 156 lbs.  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   Today he returns for HF follow up with paramedic. He has fatigue and dyspnea after walking about 1/4 mile.  No chest pain.  No lightheadedness.  He has 2 pillow orthopnea.  Occasional cough.  Weight stable. He  lives in a hotel. He smokes about 3 cigs/day.  He has not used cocaine since last admission.   ECG (personally reviewed): NSR, PVCs  Labs (6/23): K 3.8, creatinine 1.75 Labs (7/23): K 4.3, creatinine 1.39 Labs (8/23): digoxin 0.2, K 4.9, creatinine 1.37  PMH: 1. CVA: Left MCA CVA in 2/23, treated with tenecteplase.  Thought to be due to low EF/cardiomyopathy.  2. Left vestibular schwannoma.  3. Hyperlipidemia 4. H/o cocaine abuse. 5. H/o ETOH abuse.  6. 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  Most recent echo in 9/23 showed moderate mitral stenosis with trivial MR.  7. Chronic systolic CHF: Mixed ischemic/nonischemic CMP.  Cocaine/ETOH abuse, HTN, CAD.  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 - Inferolateral MI 5/23 with LHC showing occlusion of the apical LAD.  8. CAD: Inferolateral STEMI in 5/23 with LHC showing occluded apical LAD managed medically (?embolic vs plaque rupture).    Past Medical History:  Diagnosis Date   Asthma    Brain tumor ([place])    MI (myocardial infarction) ([place])    Stroke [facility])    Current Outpatient Medications  Medication Sig Dispense Refill   apixaban (ELIQUIS) 5 MG TABS tablet Take 1 tablet (5 mg total) by mouth 2 (two) times daily. 60 tablet 5  atorvastatin (LIPITOR) 40 MG tablet Take 1 tablet (40 mg total) by mouth daily. 30 tablet 3   clopidogrel (PLAVIX) 75 MG tablet Take 1 tablet (75 mg total) by mouth daily. 30 tablet 6   digoxin (LANOXIN) 0.125 MG tablet Take 1 tablet (0.125 mg total) by mouth daily. 30 tablet 6   empagliflozin (JARDIANCE) 10 MG TABS tablet Take 1 tablet (10 mg total) by mouth daily. 30 tablet 6   famotidine  (PEPCID) 20 MG tablet Take 1 tablet (20 mg total) by mouth daily. 30 tablet 6   Glycerin-Hypromellose-PEG 400 0.2-0.2-1 % SOLN Place 1 drop into both eyes as needed. 15 mL 0   isosorbide-hydrALAZINE (BIDIL) 20-37.5 MG tablet Take 1 tablet by mouth 3 (three) times daily. 90 tablet 6   meloxicam (MOBIC) 7.5 MG tablet Take 7.5 mg by mouth as needed for pain.     sacubitril-valsartan (ENTRESTO) 49-51 MG Take 1 tablet by mouth 2 (two) times daily. 60 tablet 3   spironolactone (ALDACTONE) 25 MG tablet Take 0.5 tablets (12.5 mg total) by mouth daily. 45 tablet 3   triamcinolone ointment (KENALOG) 0.5 % APPLY TOPICALLY TO THE AFFECTED AREA TWICE DAILY 60 g 2   carvedilol (COREG) 6.25 MG tablet Take 1 tablet (6.25 mg total) by mouth 2 (two) times daily. 180 tablet 3   No current facility-administered medications for this encounter.   Allergies  Allergen Reactions   Shellfish Allergy Anaphylaxis   Peanut-Containing Drug Products Itching   Social History   Socioeconomic History   Marital status: Significant Other    Spouse name: Not on file   Number of children: 7   Years of education: Not on file   Highest education level: 9th grade  Occupational History   Occupation: unemployed    Comment: experiencing homelessness-living at daily rent motels.  Tobacco Use   Smoking status: Every Day    Packs/day: 1.00    Years: 41.00    Total pack years: 41.00    Types: Cigarettes    Passive exposure: Current   Smokeless tobacco: Never  Vaping Use   Vaping Use: Never used  Substance and Sexual Activity   Alcohol use: Yes    Alcohol/week: 63.0 standard drinks of alcohol    Types: 63 Cans of beer per week    Comment: 3-40oz beer/day x10 years. 1-2 40oz beer/day in 20s/30s   Drug use: Yes    Types: "Crack" cocaine, Cocaine    Comment: last use 01/13/22   Sexual activity: Not on file  Other Topics Concern   Not on file  Social History Narrative   Not on file   Social Determinants of Health    Financial Resource Strain: High Risk (03/21/2022)   Overall Financial Resource Strain (CARDIA)    Difficulty of Paying Living Expenses: Very hard  Food Insecurity: No Food Insecurity (09/19/2021)   Hunger Vital Sign    Worried About Running Out of Food in the Last Year: Never true    Ran Out of Food in the Last Year: Never true  Transportation Needs: Unmet Transportation Needs (04/04/2022)   PRAPARE - Hydrologist (Medical): Yes    Lack of Transportation (Non-Medical): Yes  Physical Activity: Not on file  Stress: Not on file  Social Connections: Not on file  Intimate Partner Violence: Not on file   History reviewed. No pertinent family history.  BP 120/70   Pulse 76   Wt 76.2 kg (168 lb)   SpO2 100%  BMI 26.71 kg/m   Wt Readings from Last 3 Encounters:  04/04/22 76.2 kg (168 lb)  04/04/22 76.2 kg (168 lb)  04/02/22 75.8 kg (167 lb 3.2 oz)   PHYSICAL EXAM: General: NAD Neck: No JVD, no thyromegaly or thyroid nodule.  Lungs: Clear to auscultation bilaterally with normal respiratory effort. CV: Nondisplaced PMI.  Heart regular S1/S2, no S3/S4, no murmur.  No peripheral edema.  No carotid bruit.  Normal pedal pulses.  Abdomen: Soft, nontender, no hepatosplenomegaly, no distention.  Skin: Intact without lesions or rashes.  Neurologic: Alert and oriented x 3.  Psych: Normal affect. Extremities: No clubbing or cyanosis. Right weakness.  HEENT: Normal.   ASSESSMENT & PLAN: 1. CAD: Admit 6/23 with inferolateral STEMI by ECG, cath showed occluded apical LAD.  Cannot rule out plaque rupture in setting of cocaine abuse, but given history of suspected cardioembolic CVA, wonder if this was not a cardioembolic MI.  No chest pain.  - Continue atorvastatin  - Continue Plavix (post-MI, continue 1 year as long as no concerning bleeding) + Eliquis.  Think he should remain anticoagulated (was supposed to be on prior to admission but not sure if he was taking)  given suspected cardioembolism.  2. Chronic systolic CHF: Patient was found to have cardiomyopathy in 2/23 at time of admission for CVA.  Echo showed EF 25-30% at that time.  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  MI in 0/81 (?cardioembolic) also contributes.  Echo in 5/23 showed LV EF < 20%, RV mildly decreased function, abnormal mitral valve looks rheumatic => suspect moderate mitral stenosis at least with planimetered MVA 1.6 cm^2 and calculated MVA by VTI 0.8 cm^2; mean gradient only 6 mmHg but likely function of low cardiac output. The IVC was small/non-dilated. Echo today showed EF 30-35% with mildly decreased RV systolic function.  NYHA class II symptoms.  He is not volume overloaded on exam.  - Continue Entresto 49/51 mg bid.  - Continue digoxin 0.125. Check level. - Continue Bidil 1 tab tid. - Stop Corlanor and increase Coreg to 6.25 mg bid.  - Continue Jardiance 10 mg daily.   - Start spironolactone 12.5 mg daily with BMET today and in 10 days.  - If he remains off cocaine and EF stays low, would be ICD candidate (narrow QRS so no CRT).  3. CVA: 2/23, has residual right-sided weakness.  Thought to be cardioembolic at the time, started on anticoagulation.   - Using Eliquis given concerns with compliance with warfarin INR checks. No abnormal bleeding. 4. Cocaine abuse: He has quit since 5/23.  5. 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   Echo today shows moderate MS with trivial MR.  Unusual given age. He denies known rheumatic fever though he remembers have strep throat at least twice as a child.  - I will arrange for hemodynamic left and right heart cath to assess mitral stenosis.  We discussed risks/benefits and he agrees to procedure.  Hold Eliquis day before and day of.  He may have to spend the night in the hospital afterwards (has no one to stay with).   6. Homeless: Staying in [place].  HFSW helping with resources. 7. Vestibular schwannoma: Has had vertigo and falls.    Continue paramedicine. Followup in 3-4 wks with APP.   Loralie Champagne 04/04/2022

## 2022-04-04 NOTE — Progress Notes (Signed)
Advanced Heart Failure Clinic Note   Primary Care: [name], NP HF Cardiologist: Dr. Aundra Dubin  HPI: Craig Schmidt is a 51 y.o. 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   Admitted 02/23 with left MCA CVA s/p tenecteplase. Stroke felt to be likely secondary to cardiomyopathy. Echo EF 25-30%,k RV mildly reduced, RVSP 50 mmHg, moderate MS, mild to moderate MR, moderate to severe TR, dilated IVC. TEE 02/23: EF 25-30%, RV severely reduced, MV stenotic (? Parachute mitral valve), MVA 1.45 cm2 consistent with severe MS, moderate MR, no interatrial shunt, no LV or LAA thrombus. Cardiology consulted. CM likely d/t subtance abuse +/- uncontrolled HTN. Had been started on coumadin but later switched to eliquis d/t concern for compliance given his social situation.  Admitted 5/23 with acute inferolateral STEMI. Had used cocaine 24 hrs prior to presentation. Emergent LHC with 100% apical LAD suspected embolic vs plaque rupture with thrombosis. LVEDP 40. Echo this admit showed EF < 20%, ? RV okay. He was significantly volume overloaded and hypertensive. Started on lasix gtt and nitro gtt. Had good diuresis but Scr began to trend up, 1.5>1.77>1.87>2.0.  Developed soft BP and BP-active meds held. There was concern for low-output. PICC placed and Co-ox within normal range. Drips weaned and GDMT started. He was discharged home, weight 156 lbs.  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   Today he returns for HF follow up with paramedic. He has fatigue and dyspnea after walking about 1/4 mile.  No chest pain.  No lightheadedness.  He has 2 pillow orthopnea.  Occasional cough.  Weight stable. He  lives in a hotel. He smokes about 3 cigs/day.  He has not used cocaine since last admission.   ECG (personally reviewed): NSR, PVCs  Labs (6/23): K 3.8, creatinine 1.75 Labs (7/23): K 4.3, creatinine 1.39 Labs (8/23): digoxin 0.2, K 4.9, creatinine 1.37  PMH: 1. CVA: Left MCA CVA in 2/23, treated with tenecteplase.  Thought to be due to low EF/cardiomyopathy.  2. Left vestibular schwannoma.  3. Hyperlipidemia 4. H/o cocaine abuse. 5. H/o ETOH abuse.  6. 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  Most recent echo in 9/23 showed moderate mitral stenosis with trivial MR.  7. Chronic systolic CHF: Mixed ischemic/nonischemic CMP.  Cocaine/ETOH abuse, HTN, CAD.  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 - Inferolateral MI 5/23 with LHC showing occlusion of the apical LAD.  8. CAD: Inferolateral STEMI in 5/23 with LHC showing occluded apical LAD managed medically (?embolic vs plaque rupture).    Past Medical History:  Diagnosis Date   Asthma    Brain tumor ([place])    MI (myocardial infarction) ([place])    Stroke [facility])    Current Outpatient Medications  Medication Sig Dispense Refill   apixaban (ELIQUIS) 5 MG TABS tablet Take 1 tablet (5 mg total) by mouth 2 (two) times daily. 60 tablet 5  atorvastatin (LIPITOR) 40 MG tablet Take 1 tablet (40 mg total) by mouth daily. 30 tablet 3   clopidogrel (PLAVIX) 75 MG tablet Take 1 tablet (75 mg total) by mouth daily. 30 tablet 6   digoxin (LANOXIN) 0.125 MG tablet Take 1 tablet (0.125 mg total) by mouth daily. 30 tablet 6   empagliflozin (JARDIANCE) 10 MG TABS tablet Take 1 tablet (10 mg total) by mouth daily. 30 tablet 6   famotidine  (PEPCID) 20 MG tablet Take 1 tablet (20 mg total) by mouth daily. 30 tablet 6   Glycerin-Hypromellose-PEG 400 0.2-0.2-1 % SOLN Place 1 drop into both eyes as needed. 15 mL 0   isosorbide-hydrALAZINE (BIDIL) 20-37.5 MG tablet Take 1 tablet by mouth 3 (three) times daily. 90 tablet 6   meloxicam (MOBIC) 7.5 MG tablet Take 7.5 mg by mouth as needed for pain.     sacubitril-valsartan (ENTRESTO) 49-51 MG Take 1 tablet by mouth 2 (two) times daily. 60 tablet 3   spironolactone (ALDACTONE) 25 MG tablet Take 0.5 tablets (12.5 mg total) by mouth daily. 45 tablet 3   triamcinolone ointment (KENALOG) 0.5 % APPLY TOPICALLY TO THE AFFECTED AREA TWICE DAILY 60 g 2   carvedilol (COREG) 6.25 MG tablet Take 1 tablet (6.25 mg total) by mouth 2 (two) times daily. 180 tablet 3   No current facility-administered medications for this encounter.   Allergies  Allergen Reactions   Shellfish Allergy Anaphylaxis   Peanut-Containing Drug Products Itching   Social History   Socioeconomic History   Marital status: Significant Other    Spouse name: Not on file   Number of children: 7   Years of education: Not on file   Highest education level: 9th grade  Occupational History   Occupation: unemployed    Comment: experiencing homelessness-living at daily rent motels.  Tobacco Use   Smoking status: Every Day    Packs/day: 1.00    Years: 41.00    Total pack years: 41.00    Types: Cigarettes    Passive exposure: Current   Smokeless tobacco: Never  Vaping Use   Vaping Use: Never used  Substance and Sexual Activity   Alcohol use: Yes    Alcohol/week: 63.0 standard drinks of alcohol    Types: 63 Cans of beer per week    Comment: 3-40oz beer/day x10 years. 1-2 40oz beer/day in 20s/30s   Drug use: Yes    Types: "Crack" cocaine, Cocaine    Comment: last use 01/13/22   Sexual activity: Not on file  Other Topics Concern   Not on file  Social History Narrative   Not on file   Social Determinants of Health    Financial Resource Strain: High Risk (03/21/2022)   Overall Financial Resource Strain (CARDIA)    Difficulty of Paying Living Expenses: Very hard  Food Insecurity: No Food Insecurity (09/19/2021)   Hunger Vital Sign    Worried About Running Out of Food in the Last Year: Never true    Ran Out of Food in the Last Year: Never true  Transportation Needs: Unmet Transportation Needs (04/04/2022)   PRAPARE - Hydrologist (Medical): Yes    Lack of Transportation (Non-Medical): Yes  Physical Activity: Not on file  Stress: Not on file  Social Connections: Not on file  Intimate Partner Violence: Not on file   History reviewed. No pertinent family history.  BP 120/70   Pulse 76   Wt 76.2 kg (168 lb)   SpO2 100%  BMI 26.71 kg/m   Wt Readings from Last 3 Encounters:  04/04/22 76.2 kg (168 lb)  04/04/22 76.2 kg (168 lb)  04/02/22 75.8 kg (167 lb 3.2 oz)   PHYSICAL EXAM: General: NAD Neck: No JVD, no thyromegaly or thyroid nodule.  Lungs: Clear to auscultation bilaterally with normal respiratory effort. CV: Nondisplaced PMI.  Heart regular S1/S2, no S3/S4, no murmur.  No peripheral edema.  No carotid bruit.  Normal pedal pulses.  Abdomen: Soft, nontender, no hepatosplenomegaly, no distention.  Skin: Intact without lesions or rashes.  Neurologic: Alert and oriented x 3.  Psych: Normal affect. Extremities: No clubbing or cyanosis. Right weakness.  HEENT: Normal.   ASSESSMENT & PLAN: 1. CAD: Admit 6/23 with inferolateral STEMI by ECG, cath showed occluded apical LAD.  Cannot rule out plaque rupture in setting of cocaine abuse, but given history of suspected cardioembolic CVA, wonder if this was not a cardioembolic MI.  No chest pain.  - Continue atorvastatin  - Continue Plavix (post-MI, continue 1 year as long as no concerning bleeding) + Eliquis.  Think he should remain anticoagulated (was supposed to be on prior to admission but not sure if he was taking)  given suspected cardioembolism.  2. Chronic systolic CHF: Patient was found to have cardiomyopathy in 2/23 at time of admission for CVA.  Echo showed EF 25-30% at that time.  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  MI in 3/29 (?cardioembolic) also contributes.  Echo in 5/23 showed LV EF < 20%, RV mildly decreased function, abnormal mitral valve looks rheumatic => suspect moderate mitral stenosis at least with planimetered MVA 1.6 cm^2 and calculated MVA by VTI 0.8 cm^2; mean gradient only 6 mmHg but likely function of low cardiac output. The IVC was small/non-dilated. Echo today showed EF 30-35% with mildly decreased RV systolic function.  NYHA class II symptoms.  He is not volume overloaded on exam.  - Continue Entresto 49/51 mg bid.  - Continue digoxin 0.125. Check level. - Continue Bidil 1 tab tid. - Stop Corlanor and increase Coreg to 6.25 mg bid.  - Continue Jardiance 10 mg daily.   - Start spironolactone 12.5 mg daily with BMET today and in 10 days.  - If he remains off cocaine and EF stays low, would be ICD candidate (narrow QRS so no CRT).  3. CVA: 2/23, has residual right-sided weakness.  Thought to be cardioembolic at the time, started on anticoagulation.   - Using Eliquis given concerns with compliance with warfarin INR checks. No abnormal bleeding. 4. Cocaine abuse: He has quit since 5/23.  5. 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   Echo today shows moderate MS with trivial MR.  Unusual given age. He denies known rheumatic fever though he remembers have strep throat at least twice as a child.  - I will arrange for hemodynamic left and right heart cath to assess mitral stenosis.  We discussed risks/benefits and he agrees to procedure.  Hold Eliquis day before and day of.  He may have to spend the night in the hospital afterwards (has no one to stay with).   6. Homeless: Staying in [place].  HFSW helping with resources. 7. Vestibular schwannoma: Has had vertigo and falls.    Continue paramedicine. Followup in 3-4 wks with APP.   Loralie Champagne 04/04/2022

## 2022-04-04 NOTE — Progress Notes (Signed)
Picked up and delivered pt's prescriptions for Spironolactone and Carvedilol and delivered to his address at 5:00.  Meds reconciled- removed Corlanor, added Carvedilol 6.'25mg'$ . BID and 12.'5mg'$ . Spironolactone.   Visit complete.

## 2022-04-04 NOTE — Progress Notes (Signed)
Paramedicine Encounter   Patient ID: Craig Schmidt , male,   DOB: 01-30-1971,51 y.o.,  MRN: [id number]   Met patient in clinic today with provider.  Time spent with patient 1hr. 15 minutes.    Clinic visit with [name] today.  Dr. Aundra Dubin discussed w/ pt having a heart cath to measure pressures in his Mitral Valve.  This procedure is scheduled for 04/13/22.    Med changes as follows:  Discontinue Corlanor, increase Carvedilol to 6.57m BID  and add Spironolactone 12.525monce daily.  [name] to discontinue his Eliquis the day before his procedure 9/21  and the day of procedure 9/22.  Labs drawn today and should have follow-up labs 7-10 from todays visit.     DeRenee RamusEMWhite Oak/13/2023

## 2022-04-05 ENCOUNTER — Ambulatory Visit: Payer: Medicaid Other | Admitting: Occupational Therapy

## 2022-04-10 ENCOUNTER — Ambulatory Visit: Payer: Medicaid Other | Admitting: Occupational Therapy

## 2022-04-11 ENCOUNTER — Telehealth: Payer: Self-pay | Admitting: Licensed Clinical Social Worker

## 2022-04-11 NOTE — Telephone Encounter (Signed)
H&V Care Navigation CSW Progress Note  Clinical Social Worker contacted patient by phone to f/u on his request for transportation to upcoming cath. I confirmed his current phone number is [address] [place], [place], [place], [address] ([employer]). I am able to arrange a cab via [facility], pt has his significant other able to come with him to procedure and accompany home/for recovery. I have contacted team lead Tressia Danas, to ensure we can assist before arranging, and Dyann Ruddle, RN to ensure I have correct time for pt to arrive for procedure. Once I have confirmed the above I will send in vouchers and contact pt via text with time and number for [facility] for return ride as requested.   Patient is participating in a Managed Medicaid Plan:  Yes- [employer] Medicaid  [place]: No Food Insecurity (09/19/2021)  Housing: High Risk (01/01/2022)  Transportation Needs: Unmet Transportation Needs (04/04/2022)  Alcohol Screen: High Risk (09/19/2021)  Depression (PHQ2-9): Low Risk  (03/05/2022)  Financial Resource Strain: High Risk (03/21/2022)  Tobacco Use: High Risk (04/04/2022)   Craig Schmidt, MSW, [place]  [phone]- work cell phone (preferred) [phone]- desk phone

## 2022-04-11 NOTE — Telephone Encounter (Signed)
H&V Care Navigation CSW Progress Note  Clinical Social Worker contacted patient by phone to provide details for pick up with Assunta Gambles to get to cath procedure on 9/22. LCSW was able to confirm we can provide ride with Kennyth Lose, LCSW, as long as pt significant other rides with him and monitors him after which he has confirmed. I also confirmed pt needs to arrive at Entrance A at 5:30am for cath. I texted him the following to [phone]:  "Hi Mr. Hanners, The cab from [facility] will pick you both up at 5am on 9/22, you will need to check in at 5:30am at [facility]. When youre done they will pick you up at the main entrance. Call [phone] when done and let them know a ride was arranged for you on a voucher from the Heart Failure clinic. I will double check for you that your ride is scheduled tomorrow. If you can let me know you got this message!"   I remain available, will f/u again tomorrow to confirm ride scheduled.   Patient is participating in a Managed Medicaid Plan:  Yes, [employer] Medicaid.   SDOH Screenings   Food Insecurity: No Food Insecurity (09/19/2021)  Housing: High Risk (01/01/2022)  Transportation Needs: Unmet Transportation Needs (04/11/2022)  Alcohol Screen: High Risk (09/19/2021)  Depression (PHQ2-9): Low Risk  (03/05/2022)  Financial Resource Strain: High Risk (03/21/2022)  Tobacco Use: High Risk (04/04/2022)    Westley Hummer, MSW, LCSW Clinical Social Worker II [place]  [phone]- work cell phone (preferred) [phone]- desk phone   - .-

## 2022-04-12 ENCOUNTER — Telehealth (HOSPITAL_COMMUNITY): Payer: Self-pay | Admitting: Emergency Medicine

## 2022-04-12 ENCOUNTER — Ambulatory Visit: Payer: Medicaid Other | Admitting: Occupational Therapy

## 2022-04-12 ENCOUNTER — Telehealth: Payer: Self-pay | Admitting: Licensed Clinical Social Worker

## 2022-04-12 NOTE — Telephone Encounter (Signed)
H&V Care Navigation CSW Progress Note  Clinical Social Worker contacted patient by phone at [phone] to update him that I was able to confirm his ride pick up for tomorrow at 5am w/ [employer]. Pt is not sure if he got my text yesterday so he and his significant other took down [employer] arrival time, their number and instructions on how to will call for pick up. Pt also has my name and number if any issues. I remain available.   Patient is participating in a Managed Medicaid Plan:  Yes- [employer] Medicaid   [place]: No Food Insecurity (09/19/2021)  Housing: High Risk (01/01/2022)  Transportation Needs: Unmet Transportation Needs (04/11/2022)  Alcohol Screen: High Risk (09/19/2021)  Depression (PHQ2-9): Low Risk  (03/05/2022)  Financial Resource Strain: High Risk (03/21/2022)  Tobacco Use: High Risk (04/04/2022)   Westley Hummer, MSW, [place]  [phone]- work cell phone (preferred) [phone]- desk phone

## 2022-04-12 NOTE — Telephone Encounter (Signed)
Spoke with Craig Schmidt to remind him to not take his Eliquis the 21st or 22nd.  (The day before and day of his procedure).  He advised he is aware of same.  He also states he's gonna reach out to [facility] regarding the time of his transport in the morning.    [name], [place] 04/12/2022

## 2022-04-13 ENCOUNTER — Ambulatory Visit: Payer: Self-pay

## 2022-04-13 ENCOUNTER — Telehealth: Payer: Self-pay

## 2022-04-13 ENCOUNTER — Encounter (HOSPITAL_COMMUNITY): Payer: Self-pay | Admitting: Cardiology

## 2022-04-13 ENCOUNTER — Encounter (HOSPITAL_COMMUNITY)
Admission: RE | Disposition: A | Discharge status: Home / Self Care | Payer: Self-pay | Source: Home / Self Care | Attending: Cardiology

## 2022-04-13 ENCOUNTER — Other Ambulatory Visit: Payer: Self-pay

## 2022-04-13 ENCOUNTER — Ambulatory Visit (HOSPITAL_COMMUNITY)
Admission: RE | Admit: 2022-04-13 | Discharge: 2022-04-13 | Disposition: A | Discharge status: Home / Self Care | Payer: Medicaid Other | Attending: Cardiology | Admitting: Cardiology

## 2022-04-13 DIAGNOSIS — I252 Old myocardial infarction: Secondary | ICD-10-CM | POA: Diagnosis not present

## 2022-04-13 DIAGNOSIS — I050 Rheumatic mitral stenosis: Secondary | ICD-10-CM | POA: Insufficient documentation

## 2022-04-13 DIAGNOSIS — F1411 Cocaine abuse, in remission: Secondary | ICD-10-CM | POA: Insufficient documentation

## 2022-04-13 DIAGNOSIS — Z5901 Sheltered homelessness: Secondary | ICD-10-CM | POA: Insufficient documentation

## 2022-04-13 DIAGNOSIS — I272 Pulmonary hypertension, unspecified: Secondary | ICD-10-CM | POA: Diagnosis not present

## 2022-04-13 DIAGNOSIS — Z7901 Long term (current) use of anticoagulants: Secondary | ICD-10-CM | POA: Diagnosis not present

## 2022-04-13 DIAGNOSIS — I5022 Chronic systolic (congestive) heart failure: Secondary | ICD-10-CM | POA: Insufficient documentation

## 2022-04-13 DIAGNOSIS — I5042 Chronic combined systolic (congestive) and diastolic (congestive) heart failure: Secondary | ICD-10-CM

## 2022-04-13 DIAGNOSIS — Z86018 Personal history of other benign neoplasm: Secondary | ICD-10-CM | POA: Insufficient documentation

## 2022-04-13 DIAGNOSIS — I69351 Hemiplegia and hemiparesis following cerebral infarction affecting right dominant side: Secondary | ICD-10-CM | POA: Diagnosis not present

## 2022-04-13 DIAGNOSIS — Z7902 Long term (current) use of antithrombotics/antiplatelets: Secondary | ICD-10-CM | POA: Diagnosis not present

## 2022-04-13 DIAGNOSIS — I2510 Atherosclerotic heart disease of native coronary artery without angina pectoris: Secondary | ICD-10-CM | POA: Diagnosis not present

## 2022-04-13 DIAGNOSIS — I340 Nonrheumatic mitral (valve) insufficiency: Secondary | ICD-10-CM | POA: Diagnosis not present

## 2022-04-13 DIAGNOSIS — Z79899 Other long term (current) drug therapy: Secondary | ICD-10-CM | POA: Insufficient documentation

## 2022-04-13 HISTORY — PX: RIGHT/LEFT HEART CATH AND CORONARY ANGIOGRAPHY: CATH118266

## 2022-04-13 LAB — BASIC METABOLIC PANEL
Anion gap: 11 (ref 5–15)
BUN: 23 mg/dL — ABNORMAL HIGH (ref 6–20)
CO2: 19 mmol/L — ABNORMAL LOW (ref 22–32)
Calcium: 9.1 mg/dL (ref 8.9–10.3)
Chloride: 108 mmol/L (ref 98–111)
Creatinine, Ser: 1.26 mg/dL — ABNORMAL HIGH (ref 0.61–1.24)
GFR, Estimated: >60 mL/min (ref >60)
Glucose, Bld: 106 mg/dL — ABNORMAL HIGH (ref 70–99)
Potassium: 4.3 mmol/L (ref 3.5–5.1)
Sodium: 138 mmol/L (ref 135–145)

## 2022-04-13 LAB — CBC
HCT: 43.3 % (ref 39.0–52.0)
Hemoglobin: 13.9 g/dL (ref 13.0–17.0)
MCH: 31.1 pg (ref 26.0–34.0)
MCHC: 32.1 g/dL (ref 30.0–36.0)
MCV: 96.9 fL (ref 80.0–100.0)
Platelets: 282 10*3/uL (ref 150–400)
RBC: 4.47 MIL/uL (ref 4.22–5.81)
RDW: 18.1 % — ABNORMAL HIGH (ref 11.5–15.5)
WBC: 9.4 10*3/uL (ref 4.0–10.5)
nRBC: 0 % (ref 0.0–0.2)

## 2022-04-13 SURGERY — RIGHT/LEFT HEART CATH AND CORONARY ANGIOGRAPHY
Anesthesia: LOCAL

## 2022-04-13 MED ORDER — HEPARIN SODIUM (PORCINE) 1000 UNIT/ML IJ SOLN
INTRAMUSCULAR | Status: AC
  Filled 2022-04-13: qty 10

## 2022-04-13 MED ORDER — LABETALOL HCL 5 MG/ML IV SOLN: 10.0000 mg | INTRAVENOUS | Status: DC | PRN

## 2022-04-13 MED ORDER — VERAPAMIL HCL 2.5 MG/ML IV SOLN
INTRAVENOUS | Status: DC | PRN
  Administered 2022-04-13: 10 mL via INTRA_ARTERIAL

## 2022-04-13 MED ORDER — HEPARIN SODIUM (PORCINE) 1000 UNIT/ML IJ SOLN
INTRAMUSCULAR | Status: DC | PRN
  Administered 2022-04-13: 4000 [IU] via INTRAVENOUS

## 2022-04-13 MED ORDER — ONDANSETRON HCL 4 MG/2ML IJ SOLN: 4.0000 mg | Freq: Four times a day (QID) | INTRAMUSCULAR | Status: DC | PRN

## 2022-04-13 MED ORDER — FENTANYL CITRATE (PF) 100 MCG/2ML IJ SOLN
INTRAMUSCULAR | Status: AC
  Filled 2022-04-13: qty 2

## 2022-04-13 MED ORDER — SODIUM CHLORIDE 0.9 % IV SOLN: INTRAVENOUS | Status: DC

## 2022-04-13 MED ORDER — FENTANYL CITRATE (PF) 100 MCG/2ML IJ SOLN
INTRAMUSCULAR | Status: DC | PRN
  Administered 2022-04-13: 25 ug via INTRAVENOUS

## 2022-04-13 MED ORDER — HEPARIN (PORCINE) IN NACL 1000-0.9 UT/500ML-% IV SOLN
INTRAVENOUS | Status: AC
  Filled 2022-04-13: qty 1000

## 2022-04-13 MED ORDER — LIDOCAINE HCL (PF) 1 % IJ SOLN
INTRAMUSCULAR | Status: AC
  Filled 2022-04-13: qty 30

## 2022-04-13 MED ORDER — MIDAZOLAM HCL 2 MG/2ML IJ SOLN
INTRAMUSCULAR | Status: AC
  Filled 2022-04-13: qty 2

## 2022-04-13 MED ORDER — SODIUM CHLORIDE 0.9 % IV SOLN: 250.0000 mL | INTRAVENOUS | Status: DC | PRN

## 2022-04-13 MED ORDER — VERAPAMIL HCL 2.5 MG/ML IV SOLN
INTRAVENOUS | Status: AC
  Filled 2022-04-13: qty 2

## 2022-04-13 MED ORDER — SODIUM CHLORIDE 0.9% FLUSH: 3.0000 mL | INTRAVENOUS | Status: DC | PRN

## 2022-04-13 MED ORDER — LIDOCAINE HCL (PF) 1 % IJ SOLN
INTRAMUSCULAR | Status: DC | PRN
  Administered 2022-04-13 (×2): 2 mL

## 2022-04-13 MED ORDER — HEPARIN (PORCINE) IN NACL 1000-0.9 UT/500ML-% IV SOLN
INTRAVENOUS | Status: DC | PRN
  Administered 2022-04-13 (×2): 500 mL

## 2022-04-13 MED ORDER — ACETAMINOPHEN 325 MG PO TABS: 650.0000 mg | ORAL_TABLET | ORAL | Status: DC | PRN

## 2022-04-13 MED ORDER — SODIUM CHLORIDE 0.9% FLUSH: 3.0000 mL | Freq: Two times a day (BID) | INTRAVENOUS | Status: DC

## 2022-04-13 MED ORDER — HYDRALAZINE HCL 20 MG/ML IJ SOLN: 10.0000 mg | INTRAMUSCULAR | Status: DC | PRN

## 2022-04-13 MED ORDER — MIDAZOLAM HCL 2 MG/2ML IJ SOLN
INTRAMUSCULAR | Status: DC | PRN
  Administered 2022-04-13: 1 mg via INTRAVENOUS

## 2022-04-13 SURGICAL SUPPLY — 18 items
CATH BALLN WEDGE 5F 110CM (CATHETERS) IMPLANT
CATH INFINITI 5 FR JL3.5 (CATHETERS) IMPLANT
CATH INFINITI 5FR MULTPACK ANG (CATHETERS) IMPLANT
CATH SWAN GANZ 7F STRAIGHT (CATHETERS) IMPLANT
DEVICE RAD COMP TR BAND LRG (VASCULAR PRODUCTS) IMPLANT
GLIDESHEATH SLEND SS 6F .021 (SHEATH) IMPLANT
GUIDEWIRE INQWIRE 1.5J.035X260 (WIRE) IMPLANT
INQWIRE 1.5J .035X260CM (WIRE) ×1
KIT HEART LEFT (KITS) ×1 IMPLANT
PACK CARDIAC CATHETERIZATION (CUSTOM PROCEDURE TRAY) ×1 IMPLANT
SHEATH GLIDE SLENDER 4/5FR (SHEATH) IMPLANT
SHEATH PINNACLE 5F 10CM (SHEATH) IMPLANT
SHEATH PINNACLE 7F 10CM (SHEATH) IMPLANT
SHEATH PROBE COVER 6X72 (BAG) IMPLANT
TRANSDUCER W/STOPCOCK (MISCELLANEOUS) ×1 IMPLANT
TUBING ART PRESS 72  MALE/FEM (TUBING) ×1
TUBING ART PRESS 72 MALE/FEM (TUBING) IMPLANT
WIRE EMERALD 3MM-J .035X150CM (WIRE) IMPLANT

## 2022-04-13 NOTE — Telephone Encounter (Signed)
  Chief Complaint: Skin irritation Symptoms: above Frequency: ongoing Pertinent Negatives: Patient denies  Disposition: '[]'$ ED /'[]'$ Urgent Care (no appt availability in office) / '[]'$ Appointment(In office/virtual)/ '[]'$  [place] [facility]/ '[]'$ Home Care/ '[]'$ Refused Recommended Disposition /'[]'$ [place] [facility]/ '[x]'$  Follow-up with PCP Additional Notes: PT states that medication recently prescribed have not helped. Pt states that in the past he has been given Fostex which helped his eczema. Pt would like a prescription Fostex. PT has OV appt for next week.  PT also need an RX for an arm brace for his right arm, which is hurting him badly. Pt states that PCP needs to order the brace.   Summary: contact req   The patient has a long history of skin irritation   The patient shares that they've previously been prescribed medication that has been ineffective   The patient shares that they are continuing to experience dryness and irritation   The patient would like to be prescribed something for their discomfort   Please contact further when possible      02-26-2022 - Eczema persisting. Requesting a stronger medication. Reports had Fostex in the past and helped. Reason for Disposition  Prescription request for new medicine (not a refill)  Answer Assessment - Initial Assessment Questions 1. DRUG NAME: "What medicine do you need to have refilled?"     Foxtex  2. REFILLS REMAINING: "How many refills are remaining?" (Note: The label on the medicine or pill bottle will show how many refills are remaining. If there are no refills remaining, then a renewal may be needed.)     none 3. EXPIRATION DATE: "What is the expiration date?" (Note: The label states when the prescription will expire, and thus can no longer be refilled.)      4. PRESCRIBING HCP: "Who prescribed it?" Reason: If prescribed by specialist, call should be referred to that group.      5. SYMPTOMS: "Do you have any symptoms?"      Eczema 6. PREGNANCY: "Is there any chance that you are pregnant?" "When was your last menstrual period?"     na  Protocols used: Medication Refill and Renewal Call-A-AH

## 2022-04-13 NOTE — Interval H&P Note (Signed)
History and Physical Interval Note:  04/13/2022 7:50 AM  Craig Schmidt  has presented today for surgery, with the diagnosis of heart failure.  The various methods of treatment have been discussed with the patient and family. After consideration of risks, benefits and other options for treatment, the patient has consented to  Procedure(s): RIGHT/LEFT HEART CATH AND CORONARY ANGIOGRAPHY (N/A) as a surgical intervention.  The patient's history has been reviewed, patient examined, no change in status, stable for surgery.  I have reviewed the patient's chart and labs.  Questions were answered to the patient's satisfaction.     Julyan Gales [employer]

## 2022-04-13 NOTE — Discharge Instructions (Signed)

## 2022-04-13 NOTE — Telephone Encounter (Signed)
Contacted pt to advise that he was referred to Dermatology in regards to eczema.[facility] info given.  Also, [facility] has received the order for brace for pt arm pt can follow-up with their office to check status, as far as MRI pt's PCP did not order the imaging it was ordered by Benjiman Core [name], so he will need to contact his office in regards to follow-up.

## 2022-04-13 NOTE — Telephone Encounter (Signed)
Copied from Lane. Topic: General - Other >> Apr 13, 2022  2:13 PM Everette C wrote: Reason for CRM: The patient has called to request for their PCP to order an arm brace for their right arm   The patient would like to be contacted by a member of clinical staff to discuss their request further when possible  Contacted pt to advise that he was referred to Dermatology in regards to eczema.[facility] info given.  Also, [facility] has received the order for brace for pt arm pt can follow-up with their office to check status, as far as MRI pt's PCP did not order the imaging it was ordered by Benjiman Core [name], so he will need to contact his office in regards to follow-up.

## 2022-04-17 ENCOUNTER — Ambulatory Visit: Payer: Medicaid Other | Admitting: Orthopaedic Surgery

## 2022-04-17 ENCOUNTER — Ambulatory Visit: Payer: Medicaid Other | Admitting: Occupational Therapy

## 2022-04-17 LAB — POCT I-STAT EG7
Acid-base deficit: 3 mmol/L — ABNORMAL HIGH (ref 0.0–2.0)
Acid-base deficit: 3 mmol/L — ABNORMAL HIGH (ref 0.0–2.0)
Bicarbonate: 21.4 mmol/L (ref 20.0–28.0)
Bicarbonate: 22.2 mmol/L (ref 20.0–28.0)
Calcium, Ion: 1.12 mmol/L — ABNORMAL LOW (ref 1.15–1.40)
Calcium, Ion: 1.21 mmol/L (ref 1.15–1.40)
HCT: 38 % — ABNORMAL LOW (ref 39.0–52.0)
HCT: 39 % (ref 39.0–52.0)
Hemoglobin: 12.9 g/dL — ABNORMAL LOW (ref 13.0–17.0)
Hemoglobin: 13.3 g/dL (ref 13.0–17.0)
O2 Saturation: 76 %
O2 Saturation: 78 %
Potassium: 3.5 mmol/L (ref 3.5–5.1)
Potassium: 3.8 mmol/L (ref 3.5–5.1)
Sodium: 140 mmol/L (ref 135–145)
Sodium: 142 mmol/L (ref 135–145)
TCO2: 23 mmol/L (ref 22–32)
TCO2: 23 mmol/L (ref 22–32)
pCO2, Ven: 36.6 mmHg — ABNORMAL LOW (ref 44–60)
pCO2, Ven: 37.8 mmHg — ABNORMAL LOW (ref 44–60)
pH, Ven: 7.375 (ref 7.25–7.43)
pH, Ven: 7.378 (ref 7.25–7.43)
pO2, Ven: 42 mmHg (ref 32–45)
pO2, Ven: 43 mmHg (ref 32–45)

## 2022-04-18 ENCOUNTER — Ambulatory Visit: Payer: Medicaid Other | Admitting: Family

## 2022-04-18 ENCOUNTER — Telehealth: Payer: Self-pay

## 2022-04-18 ENCOUNTER — Ambulatory Visit: Payer: Medicaid Other | Admitting: Internal Medicine

## 2022-04-18 DIAGNOSIS — M21371 Foot drop, right foot: Secondary | ICD-10-CM | POA: Diagnosis not present

## 2022-04-18 NOTE — Telephone Encounter (Signed)
Copied from [place] [id number]. Topic: General - Other >> Apr 18, 2022  8:53 AM [name] wrote: Reason for CRM: Patient called in states he received leg brace from [facility], but not arm brace. He gave fx # for paperwork for it to be faxed over. [phone]  The prescription from [place] was for foot/leg support, if patient needs arm brace [facility] would need to send Rx so provider will be able to sign off

## 2022-04-18 NOTE — Telephone Encounter (Signed)
Called pt to let him know Dr. Harl Bowie stated he does not to come to this appointment today since he has an appointment with heart failure clinic next month. He verbalized understanding and thanked me for calling him.

## 2022-04-19 ENCOUNTER — Ambulatory Visit: Payer: Medicaid Other | Admitting: Occupational Therapy

## 2022-04-19 ENCOUNTER — Encounter: Payer: Self-pay | Admitting: Occupational Therapy

## 2022-04-19 NOTE — Therapy (Signed)
[facility] [address] [place], [place], [address] Phone: [phone]   Fax:  [phone]  Patient Details  Name: Craig Schmidt MRN: [id number] Date of Birth: 13-Nov-1970 Referring Provider:  No ref. provider found  Encounter Date: 04/19/2022   OCCUPATIONAL THERAPY DISCHARGE SUMMARY  Visits from Start of Care: 2  Current functional level related to goals / functional outcomes:  SHORT TERM GOALS:    Pt will be independent with initial HEP for RUE. Baseline:  no HEP Goal status: instructed in initial HEP, but did not return to review   2.  Pt will verbalize understanding of proper positioning of RUE to decr pain and prevent injury. Baseline:  pt positioned in flexor synergy pattern with pain Goal status: Not fully met, began but not completed education due to pt not returning   3.  Pt will demo at least 90* R shoulder flex without pain for functional reach. Baseline: 80* with pain Goal status: Not met/unable to reassess due to pt not returning to OT   4.  Pt will report consistently using RUE as a stabilizer for functional tasks. Baseline:  pt not currently using RUE functionally Goal status: Not met   5.  Pt will be independent with HEP for vision/vision compensation strategies. Baseline:  pt reports diplopia, no HEP, and concerns regarding crossing street safely Goal status: Not met     LONG TERM GOALS:    Pt will be independent with updated HEP for RUE. Baseline:  no HEP Goal status: not met   2.  Pt will demo at least 100* R shoulder flex without pain for functional reach. Baseline: 80* with pain Goal status: not met   3.  Pt will use RUE for gross assist for bilateral ADL tasks. Baseline: not using RUE functionally Goal status: Not met   4.  Pt will improve RUE coordination/functional reaching as shown by improving score on box and blocks test by at least 3 blocks. Baseline:  3 blocks Goal  status: Not met/unable to reassess due to pt not returning to OT   5.  Pt will perform environmental scanning/navigation in busy environment with at least 90% accuracy for incr safety. Baseline:  pt reports diplopia (particularly when making quick head/eye movements, like when crossing the street) and bumping into items   Goal status: Not met/unable to reassess due to pt not returning to OT   Remaining deficits:  Significant RUE pain, decr ROM, decr strength, decr coordination, decr RUE functional use  Visual-perceptual deficits   Education / Equipment: Began education for initial HEP and positioning of RUE to decr pain; however, unable to complete pt education due to pt not returning to OT.    Patient agrees to discharge. Patient goals were not met. Patient is being discharged due to  multiple no shows and cancellations.Vianne Bulls, OTR/L 04/19/2022, 10:35 AM  [facility] [address] [place], [place], [address] Phone: [phone]   Fax:  [phone]

## 2022-04-20 ENCOUNTER — Other Ambulatory Visit (HOSPITAL_COMMUNITY): Payer: Self-pay | Admitting: Adult Health

## 2022-04-20 ENCOUNTER — Other Ambulatory Visit: Payer: Self-pay | Admitting: Family

## 2022-04-20 DIAGNOSIS — G8929 Other chronic pain: Secondary | ICD-10-CM

## 2022-04-20 NOTE — Telephone Encounter (Signed)
Requested medications are due for refill today.  Unsure  Requested medications are on the active medications list.  yes  Last refill. 04/04/2022 - historical  Future visit scheduled.   no  Notes to clinic.  Medication is historical.     Requested Prescriptions  Pending Prescriptions Disp Refills   meloxicam (MOBIC) 7.5 MG tablet [Pharmacy Med Name: MELOXICAM 7.5MG TABLETS] 30 tablet     Sig: TAKE 1 TABLET(7.5 MG) BY MOUTH DAILY     Analgesics:  COX2 Inhibitors Failed - 04/20/2022  6:36 AM      Failed - Manual Review: Labs are only required if the patient has taken medication for more than 8 weeks.      Failed - Cr in normal range and within 360 days    Creatinine, Ser  Date Value Ref Range Status  04/13/2022 1.26 (H) 0.61 - 1.24 mg/dL Final         Passed - HGB in normal range and within 360 days    Hemoglobin  Date Value Ref Range Status  04/13/2022 13.3 13.0 - 17.0 g/dL Final  04/13/2022 12.9 (L) 13.0 - 17.0 g/dL Final   Total hemoglobin  Date Value Ref Range Status  12/26/2021 14.2 12.0 - 16.0 g/dL Final         Passed - HCT in normal range and within 360 days    HCT  Date Value Ref Range Status  04/13/2022 39.0 39.0 - 52.0 % Final  04/13/2022 38.0 (L) 39.0 - 52.0 % Final         Passed - AST in normal range and within 360 days    AST  Date Value Ref Range Status  02/05/2022 32 15 - 41 U/L Final         Passed - ALT in normal range and within 360 days    ALT  Date Value Ref Range Status  02/05/2022 10 0 - 44 U/L Final         Passed - eGFR is 30 or above and within 360 days    GFR calc Af Amer  Date Value Ref Range Status  10/03/2018 52 (L) >60 mL/min Final   GFR, Estimated  Date Value Ref Range Status  04/13/2022 >60 >60 mL/min Final    Comment:    (NOTE) Calculated using the CKD-EPI Creatinine Equation (2021)          Passed - Patient is not pregnant      Passed - Valid encounter within last 12 months    Recent Outpatient Visits            1 month ago Chronic bilateral low back pain, unspecified whether sciatica present   Primary Care at [facility], Amy J, NP   1 month ago Annual physical exam   Primary Care at [facility] [place], Amy J, NP   2 months ago Encounter to establish care   Primary Care at [facility], Amy J, NP   6 years ago Unilateral vestibular schwannoma [facility])   [place], En[place]ong, MD       Future Appointments             In 6 days Garvin Fila, MD [facility]

## 2022-04-25 ENCOUNTER — Other Ambulatory Visit (HOSPITAL_COMMUNITY): Payer: Self-pay | Admitting: Emergency Medicine

## 2022-04-25 ENCOUNTER — Encounter (HOSPITAL_COMMUNITY): Payer: Medicaid Other
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
## 2022-04-26 ENCOUNTER — Inpatient Hospital Stay: Payer: Medicaid Other | Admitting: Neurology

## 2022-04-26 NOTE — Telephone Encounter (Signed)
Order complete. Patient established with [facility] for management of back pain. Call patient and notify him of increased bleeding risk while taking Eliquis and Meloxicam. He should monitor for signs of bleeding and bruising, notify me if this occurs, and seek immediate medical evaluation at the emergency department.

## 2022-04-26 NOTE — Telephone Encounter (Signed)
Order complete. 

## 2022-04-26 NOTE — Telephone Encounter (Signed)
Order complete. Patient established with [facility] for management of back pain. Call patient and notify him of increased bleeding risk while taking Eliquis and Meloxicam. He should monitor for signs of bleeding and bruising, notify me if this occurs, and seek immediate medical evaluation at the emergency department.

## 2022-04-30 ENCOUNTER — Telehealth (HOSPITAL_COMMUNITY): Payer: Self-pay | Admitting: Licensed Clinical Social Worker

## 2022-04-30 DIAGNOSIS — R32 Unspecified urinary incontinence: Secondary | ICD-10-CM | POA: Diagnosis not present
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
## 2022-05-03 DIAGNOSIS — I5043 Acute on chronic combined systolic (congestive) and diastolic (congestive) heart failure: Secondary | ICD-10-CM | POA: Diagnosis not present

## 2022-05-03 DIAGNOSIS — I213 ST elevation (STEMI) myocardial infarction of unspecified site: Secondary | ICD-10-CM | POA: Diagnosis not present

## 2022-05-04 ENCOUNTER — Telehealth (HOSPITAL_COMMUNITY): Payer: Self-pay

## 2022-05-04 NOTE — Telephone Encounter (Signed)
Called and was not able to leave patient a voice message because his number was invalid to confirm/remind patient of their appointment at the [facility] on 05/07/22.

## 2022-05-07 ENCOUNTER — Encounter (HOSPITAL_COMMUNITY): Payer: Medicaid Other

## 2022-05-08 ENCOUNTER — Other Ambulatory Visit (HOSPITAL_COMMUNITY): Payer: Self-pay | Admitting: Emergency Medicine

## 2022-05-08 NOTE — Progress Notes (Signed)
Paramedicine Encounter    Patient ID: Craig Schmidt, male    DOB: 1971/01/06, 51 y.o.   MRN: [id number]   BP 100/70   Pulse 80   Resp 14   SpO2 100%  Weight yesterday-not taken  Last visit weight-not taken  Made an unscheduled visit for Craig Schmidt today.  His cell phone is still not working.  He is currently living in a tent in the woods behind [employer] on the corner of [address].  Craig Schmidt has not been very compliant with most of his medications.  He says, "I've just got a lot going on right now."  I encouraged him to get back on track.  He missed his appointment at the clinic yesterday and I called and rescheduled him for 11/3 @ 9:30.  He has an appointment with [facility] tomorrow and I attempted to call and cancel it for him.  I could never get anyone to answer the phone so I sent a message to Dr. Earley Favor to advise her of the desire to cancel and asked her to have someone to call me and I would schedule a new appointment for sometime next month. Home visit complete.    [name], [place] 05/08/2022     Patient Care Team: [name], NP as PCP - General (Nurse Practitioner) Janina Mayo, MD as PCP - Cardiology (Cardiology)  Patient Active Problem List   Diagnosis Date Noted   Chronic combined systolic and diastolic heart failure ([place])    ST elevation myocardial infarction (STEMI) of inferolateral wall, subsequent episode of care ([place]) 12/19/2021   ST elevation myocardial infarction (STEMI) ([place])    Ischemic cardiomyopathy    Cocaine abuse ([place])    Smoker    Alcohol abuse    Acute ischemic stroke ([place]) 09/08/2021   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[place]) 05/20/2015   Tear of MCL (medial collateral ligament) of knee 05/20/2015   Protein-calorie malnutrition, severe 05/17/2015   Lactic acidosis 05/15/2015   Left knee pain 05/15/2015   Lung nodules 05/15/2015   Hypoglycemia 05/14/2015   Hypothermia  05/14/2015   Acute encephalopathy 05/14/2015   Cocaine abuse with intoxication ([place]) 05/14/2015   Alcohol intoxication in active alcoholic ([place]) 83/66/2947   Sepsis ([place]) 05/14/2015   Homeless 05/14/2015    Current Outpatient Medications:    apixaban (ELIQUIS) 5 MG TABS tablet, Take 1 tablet (5 mg total) by mouth 2 (two) times daily., Disp: 60 tablet, Rfl: 5   atorvastatin (LIPITOR) 40 MG tablet, TAKE 1 TABLET(40 MG) BY MOUTH DAILY, Disp: 30 tablet, Rfl: 11   carvedilol (COREG) 6.25 MG tablet, Take 1 tablet (6.25 mg total) by mouth 2 (two) times daily., Disp: 180 tablet, Rfl: 3   clopidogrel (PLAVIX) 75 MG tablet, Take 1 tablet (75 mg total) by mouth daily., Disp: 30 tablet, Rfl: 6   digoxin (LANOXIN) 0.125 MG tablet, Take 1 tablet (0.125 mg total) by mouth daily., Disp: 30 tablet, Rfl: 6   empagliflozin (JARDIANCE) 10 MG TABS tablet, Take 1 tablet (10 mg total) by mouth daily., Disp: 30 tablet, Rfl: 6   famotidine (PEPCID) 20 MG tablet, Take 1 tablet (20 mg total) by mouth daily., Disp: 30 tablet, Rfl: 6   Glycerin-Hypromellose-PEG 400 0.2-0.2-1 % SOLN, Place 1 drop into both eyes as needed., Disp: 15 mL, Rfl: 0   isosorbide-hydrALAZINE (BIDIL) 20-37.5 MG tablet, Take 1 tablet by mouth 3 (three) times daily., Disp: 90 tablet, Rfl: 6   meloxicam (  MOBIC) 7.5 MG tablet, TAKE 1 TABLET(7.5 MG) BY MOUTH DAILY, Disp: 30 tablet, Rfl: 1   sacubitril-valsartan (ENTRESTO) 49-51 MG, Take 1 tablet by mouth 2 (two) times daily., Disp: 60 tablet, Rfl: 3   spironolactone (ALDACTONE) 25 MG tablet, Take 0.5 tablets (12.5 mg total) by mouth daily., Disp: 45 tablet, Rfl: 3   triamcinolone ointment (KENALOG) 0.5 %, APPLY TOPICALLY TO THE AFFECTED AREA TWICE DAILY, Disp: 60 g, Rfl: 2 Allergies  Allergen Reactions   Shellfish Allergy Anaphylaxis   Peanut-Containing Drug Products Itching      Social History   Socioeconomic History   Marital status: Significant Other    Spouse name: Not on file   Number  of children: 7   Years of education: Not on file   Highest education level: 9th grade  Occupational History   Occupation: unemployed    Comment: experiencing homelessness-living at daily rent motels.  Tobacco Use   Smoking status: Every Day    Packs/day: 1.00    Years: 41.00    Total pack years: 41.00    Types: Cigarettes    Passive exposure: Current   Smokeless tobacco: Never  Vaping Use   Vaping Use: Never used  Substance and Sexual Activity   Alcohol use: Yes    Alcohol/week: 63.0 standard drinks of alcohol    Types: 63 Cans of beer per week    Comment: 3-40oz beer/day x10 years. 1-2 40oz beer/day in 20s/30s   Drug use: Yes    Types: "Crack" cocaine, Cocaine    Comment: last use 01/13/22   Sexual activity: Not on file  Other Topics Concern   Not on file  Social History Narrative   Not on file   Social Determinants of Health   Financial Resource Strain: High Risk (03/21/2022)   Overall Financial Resource Strain (CARDIA)    Difficulty of Paying Living Expenses: Very hard  Food Insecurity: No Food Insecurity (09/19/2021)   Hunger Vital Sign    Worried About Running Out of Food in the Last Year: Never true    Ran Out of Food in the Last Year: Never true  Transportation Needs: Unmet Transportation Needs (04/11/2022)   PRAPARE - Hydrologist (Medical): Yes    Lack of Transportation (Non-Medical): Yes  Physical Activity: Not on file  Stress: Not on file  Social Connections: Not on file  Intimate Partner Violence: Not on file    Physical Exam      Future Appointments  Date Time Provider [place]  05/09/2022  3:00 PM [name], MD [facility] None  05/25/2022  9:30 AM [facility] PA/NP [facility] None  06/07/2022  9:30 AM Garvin Fila, MD [facility] None       [name], [place] [facility] Paramedic  05/08/22

## 2022-05-09 ENCOUNTER — Ambulatory Visit (HOSPITAL_COMMUNITY): Payer: Medicaid Other | Admitting: Student

## 2022-05-24 ENCOUNTER — Telehealth (HOSPITAL_COMMUNITY): Payer: Self-pay

## 2022-05-24 NOTE — Telephone Encounter (Signed)
Called and was unable to leave patient a voice message because his number was invalid to confirm/remind patient of their appointment at the [facility] on 05/25/22.

## 2022-05-25 ENCOUNTER — Encounter (HOSPITAL_COMMUNITY): Payer: Self-pay

## 2022-05-25 ENCOUNTER — Other Ambulatory Visit (HOSPITAL_COMMUNITY): Payer: Self-pay | Admitting: Emergency Medicine

## 2022-05-25 ENCOUNTER — Ambulatory Visit (HOSPITAL_COMMUNITY)
Admission: RE | Admit: 2022-05-25 | Discharge: 2022-05-25 | Disposition: A | Discharge status: Home / Self Care | Payer: Medicaid Other | Source: Ambulatory Visit | Attending: Family Medicine | Admitting: Family Medicine

## 2022-05-25 ENCOUNTER — Telehealth (HOSPITAL_COMMUNITY): Payer: Self-pay | Admitting: Licensed Clinical Social Worker

## 2022-05-25 VITALS — BP 96/64 | HR 102 | Wt 163.8 lb

## 2022-05-25 DIAGNOSIS — Z8673 Personal history of transient ischemic attack (TIA), and cerebral infarction without residual deficits: Secondary | ICD-10-CM | POA: Diagnosis not present

## 2022-05-25 DIAGNOSIS — D333 Benign neoplasm of cranial nerves: Secondary | ICD-10-CM

## 2022-05-25 DIAGNOSIS — I2510 Atherosclerotic heart disease of native coronary artery without angina pectoris: Secondary | ICD-10-CM

## 2022-05-25 DIAGNOSIS — Z86018 Personal history of other benign neoplasm: Secondary | ICD-10-CM | POA: Diagnosis not present

## 2022-05-25 DIAGNOSIS — I272 Pulmonary hypertension, unspecified: Secondary | ICD-10-CM | POA: Diagnosis not present

## 2022-05-25 DIAGNOSIS — I110 Hypertensive heart disease with heart failure: Secondary | ICD-10-CM | POA: Diagnosis not present

## 2022-05-25 DIAGNOSIS — F1910 Other psychoactive substance abuse, uncomplicated: Secondary | ICD-10-CM | POA: Diagnosis not present

## 2022-05-25 DIAGNOSIS — R42 Dizziness and giddiness: Secondary | ICD-10-CM | POA: Diagnosis not present

## 2022-05-25 DIAGNOSIS — I050 Rheumatic mitral stenosis: Secondary | ICD-10-CM

## 2022-05-25 DIAGNOSIS — Z5900 Homelessness unspecified: Secondary | ICD-10-CM

## 2022-05-25 DIAGNOSIS — I252 Old myocardial infarction: Secondary | ICD-10-CM | POA: Diagnosis not present

## 2022-05-25 DIAGNOSIS — F1410 Cocaine abuse, uncomplicated: Secondary | ICD-10-CM | POA: Diagnosis not present

## 2022-05-25 DIAGNOSIS — I69351 Hemiplegia and hemiparesis following cerebral infarction affecting right dominant side: Secondary | ICD-10-CM | POA: Insufficient documentation

## 2022-05-25 DIAGNOSIS — Z7901 Long term (current) use of anticoagulants: Secondary | ICD-10-CM | POA: Insufficient documentation

## 2022-05-25 DIAGNOSIS — Z7984 Long term (current) use of oral hypoglycemic drugs: Secondary | ICD-10-CM | POA: Insufficient documentation

## 2022-05-25 DIAGNOSIS — Z79899 Other long term (current) drug therapy: Secondary | ICD-10-CM | POA: Insufficient documentation

## 2022-05-25 DIAGNOSIS — Q232 Congenital mitral stenosis: Secondary | ICD-10-CM | POA: Diagnosis not present

## 2022-05-25 DIAGNOSIS — I5022 Chronic systolic (congestive) heart failure: Secondary | ICD-10-CM

## 2022-05-25 LAB — BASIC METABOLIC PANEL
Anion gap: 8 (ref 5–15)
BUN: 23 mg/dL — ABNORMAL HIGH (ref 6–20)
CO2: 25 mmol/L (ref 22–32)
Calcium: 9.4 mg/dL (ref 8.9–10.3)
Chloride: 107 mmol/L (ref 98–111)
Creatinine, Ser: 1.65 mg/dL — ABNORMAL HIGH (ref 0.61–1.24)
GFR, Estimated: 51 mL/min — ABNORMAL LOW (ref >60)
Glucose, Bld: 107 mg/dL — ABNORMAL HIGH (ref 70–99)
Potassium: 4.1 mmol/L (ref 3.5–5.1)
Sodium: 140 mmol/L (ref 135–145)

## 2022-05-25 LAB — BRAIN NATRIURETIC PEPTIDE: B Natriuretic Peptide: 654.8 pg/mL — ABNORMAL HIGH (ref 0.0–100.0)

## 2022-05-25 NOTE — Telephone Encounter (Signed)
H&V Care Navigation CSW Progress Note  Clinical Social Worker received call from pt requesting help with rescheduling appt that he missed this morning due to lack of transportation.  CSW spoke with clinic staff and pt will plan to come this afternoon for 3pm appt- CSW arranged Melburn Popper ride through [employer].  Patient is participating in a Managed Medicaid Plan:  Yes  [place]: No Food Insecurity (09/19/2021)  Housing: High Risk (01/01/2022)  Transportation Needs: Unmet Transportation Needs (05/25/2022)  Alcohol Screen: High Risk (09/19/2021)  Depression (PHQ2-9): Low Risk  (03/05/2022)  Financial Resource Strain: High Risk (03/21/2022)  Tobacco Use: High Risk (04/13/2022)    Craig Schmidt, [facility] Desk#: [phone] Cell#: [phone]

## 2022-05-25 NOTE — Progress Notes (Signed)
Advanced Heart Failure Clinic Note   Primary Care: [name], NP HF Cardiologist: Dr. Aundra Dubin  HPI: Craig Schmidt is a 51 y.o. 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   Admitted 02/23 with left MCA CVA s/p tenecteplase. Stroke felt to be likely secondary to cardiomyopathy. Echo EF 25-30%,k RV mildly reduced, RVSP 50 mmHg, moderate MS, mild to moderate MR, moderate to severe TR, dilated IVC. TEE 02/23: EF 25-30%, RV severely reduced, MV stenotic (? Parachute mitral valve), MVA 1.45 cm2 consistent with severe MS, moderate MR, no interatrial shunt, no LV or LAA thrombus. Cardiology consulted. CM likely d/t subtance abuse +/- uncontrolled HTN. Had been started on coumadin but later switched to eliquis d/t concern for compliance given his social situation.  Admitted 5/23 with acute inferolateral STEMI. Had used cocaine 24 hrs prior to presentation. Emergent LHC with 100% apical LAD suspected embolic vs plaque rupture with thrombosis. LVEDP 40. Echo this admit showed EF < 20%, ? RV okay. He was significantly volume overloaded and hypertensive. Started on lasix gtt and nitro gtt. Had good diuresis but Scr began to trend up, 1.5>1.77>1.87>2.0.  Developed soft BP and BP-active meds held. There was concern for low-output. PICC placed and Co-ox within normal range. Drips weaned and GDMT started. He was discharged home, weight 156 lbs.  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   R/LHC (9/23) arranged to assess mitral stenosis, which showed primarily pulmonary venous hypertension, normal RA pressure and minimally elevated PCWP, and moderate mitral stenosis.   Today he returns for HF follow up. Overall  feeling fine. He has SOB walking further distances on flat ground, no breathing issues walking up stairs. Feels occasional palpitations. Denies abnormal bleeding, CP, dizziness, edema, or PND/Orthopnea. Appetite ok. No fever or chills. He does not weigh at home. Followed by paramedicine. Misses several doses of medications during the week. Drinks 1 40 oz beer/day. Last used cocaine a few weeks ago. Homeless, lives [place] a [place].  ECG (personally reviewed): none ordered today.  Labs (6/23): K 3.8, creatinine 1.75 Labs (7/23): K 4.3, creatinine 1.39 Labs (8/23): digoxin 0.2, K 4.9, creatinine 1.37 Labs (9/23): K 4.3, creatinine 1.26  PMH: 1. CVA: Left MCA CVA in 2/23, treated with tenecteplase.  Thought to be due to low EF/cardiomyopathy.  2. Left vestibular schwannoma.  3. Hyperlipidemia 4. H/o cocaine abuse. 5. H/o ETOH abuse.  6. 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  Most recent echo in 9/23 showed moderate mitral stenosis with trivial MR.  - R/LHC (9/23) showed moderate mitral stenosis, mitral valve mean gradient 10.9 mmHg, MVA 1.74 cm^2 7. Chronic systolic CHF: Mixed ischemic/nonischemic CMP.  Cocaine/ETOH abuse, HTN, CAD.  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 - Inferolateral MI 5/23 with LHC showing occlusion of the apical LAD.  - R/LHC (9/23): RA mean 4, PA 50/20 (mean 33), PCWP 15, CO/CI ([place]) 5.74/3.06, PVR 3.1 WU 8.  CAD: Inferolateral STEMI in 5/23 with LHC showing occluded apical LAD managed medically (?embolic vs plaque rupture).   Past Medical History:  Diagnosis Date   Asthma    Brain tumor ([place])    MI (myocardial infarction) ([place])    Stroke  [facility])    Current Outpatient Medications  Medication Sig Dispense Refill   apixaban (ELIQUIS) 5 MG TABS tablet Take 1 tablet (5 mg total) by mouth 2 (two) times daily. 60 tablet 5   atorvastatin (LIPITOR) 40 MG tablet TAKE 1 TABLET(40 MG) BY MOUTH DAILY 30 tablet 11   carvedilol (COREG) 6.25 MG tablet Take 1 tablet (6.25 mg total) by mouth 2 (two) times daily. 180 tablet 3   clopidogrel (PLAVIX) 75 MG tablet Take 1 tablet (75 mg total) by mouth daily. 30 tablet 6   digoxin (LANOXIN) 0.125 MG tablet Take 1 tablet (0.125 mg total) by mouth daily. 30 tablet 6   empagliflozin (JARDIANCE) 10 MG TABS tablet Take 1 tablet (10 mg total) by mouth daily. 30 tablet 6   famotidine (PEPCID) 20 MG tablet Take 1 tablet (20 mg total) by mouth daily. 30 tablet 6   Glycerin-Hypromellose-PEG 400 0.2-0.2-1 % SOLN Place 1 drop into both eyes as needed. 15 mL 0   isosorbide-hydrALAZINE (BIDIL) 20-37.5 MG tablet Take 1 tablet by mouth 3 (three) times daily. 90 tablet 6   meloxicam (MOBIC) 7.5 MG tablet TAKE 1 TABLET(7.5 MG) BY MOUTH DAILY 30 tablet 1   sacubitril-valsartan (ENTRESTO) 49-51 MG Take 1 tablet by mouth 2 (two) times daily. 60 tablet 3   spironolactone (ALDACTONE) 25 MG tablet Take 0.5 tablets (12.5 mg total) by mouth daily. 45 tablet 3   triamcinolone ointment (KENALOG) 0.5 % APPLY TOPICALLY TO THE AFFECTED AREA TWICE DAILY 60 g 2   No current facility-administered medications for this encounter.   Allergies  Allergen Reactions   Shellfish Allergy Anaphylaxis   Peanut-Containing Drug Products Itching   Social History   Socioeconomic History   Marital status: Significant Other    Spouse name: Not on file   Number of children: 7   Years of education: Not on file   Highest education level: 9th grade  Occupational History   Occupation: unemployed    Comment: experiencing homelessness-living at daily rent motels.  Tobacco Use   Smoking status: Every Day    Packs/day: 1.00    Years: 41.00     Total pack years: 41.00    Types: Cigarettes    Passive exposure: Current   Smokeless tobacco: Never  Vaping Use   Vaping Use: Never used  Substance and Sexual Activity   Alcohol use: Yes    Alcohol/week: 63.0 standard drinks of alcohol    Types: 63 Cans of beer per week    Comment: 3-40oz beer/day x10 years. 1-2 40oz beer/day in 20s/30s   Drug use: Yes    Types: "Crack" cocaine, Cocaine    Comment: last use 01/13/22   Sexual activity: Not on file  Other Topics Concern   Not on file  Social History Narrative   Not on file   Social Determinants of Health   Financial Resource Strain: High Risk (03/21/2022)   Overall Financial Resource Strain (CARDIA)    Difficulty of Paying Living Expenses: Very hard  Food Insecurity: No Food Insecurity (09/19/2021)   Hunger Vital Sign    Worried About Running Out of Food in the Last Year: Never true    Ran Out of Food in the Last Year: Never  true  Transportation Needs: Unmet Transportation Needs (05/25/2022)   PRAPARE - Hydrologist (Medical): Yes    Lack of Transportation (Non-Medical): Yes  Physical Activity: Not on file  Stress: Not on file  Social Connections: Not on file  Intimate Partner Violence: Not on file   No family history on file.  BP 96/64   Pulse (!) 102   Wt 74.3 kg (163 lb 12.8 oz)   SpO2 98%   BMI 26.05 kg/m   Wt Readings from Last 3 Encounters:  05/25/22 74.3 kg (163 lb 12.8 oz)  04/04/22 76.2 kg (168 lb)  04/04/22 76.2 kg (168 lb)   PHYSICAL EXAM: General:  NAD. No resp difficulty, walked into clinic HEENT: Normal Neck: Supple. No JVD. Carotids 2+ bilat; no bruits. No lymphadenopathy or thryomegaly appreciated. Cor: PMI nondisplaced. Regular rate & rhythm. No rubs, gallops or murmurs. Lungs: Clear Abdomen: Soft, nontender, nondistended. No hepatosplenomegaly. No bruits or masses. Good bowel sounds. Extremities: No cyanosis, clubbing, rash, edema Neuro: Alert & oriented x 3, cranial  nerves grossly intact. Moves all 4 extremities w/o difficulty. Affect pleasant.  ASSESSMENT & PLAN: 1. CAD: Admit 6/23 with inferolateral STEMI by ECG, cath showed occluded apical LAD.  Cannot rule out plaque rupture in setting of cocaine abuse, but given history of suspected cardioembolic CVA, wonder if this was not a cardioembolic MI.  No chest pain.  - Continue atorvastatin  - Continue Plavix (post-MI, continue 1 year as long as no concerning bleeding) + Eliquis.  Think he should remain anticoagulated (was supposed to be on prior to admission but not sure if he was taking) given suspected cardioembolism.  2. Chronic systolic CHF: Patient was found to have cardiomyopathy in 2/23 at time of admission for CVA.  Echo showed EF 25-30% at that time.  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  MI in 4/48 (?cardioembolic) also contributes.  Echo in 5/23 showed LV EF < 20%, RV mildly decreased function, abnormal mitral valve looks rheumatic => suspect moderate mitral stenosis at least with planimetered MVA 1.6 cm^2 and calculated MVA by VTI 0.8 cm^2; mean gradient only 6 mmHg but likely function of low cardiac output. The IVC was small/non-dilated. Echo 9/23 showed EF 30-35% with mildly decreased RV systolic function. RHC (9/23) showed normal RA pressure, mildly elevated PCWP.  NYHA class II symptoms.  He is not volume overloaded on exam.  - Continue Entresto 49/51 mg bid.  - Continue digoxin 0.125. Dig level 0.3 04/04/22. - Continue Bidil 1 tab tid. - Continue Coreg 6.25 mg bid.  - Continue Jardiance 10 mg daily.   - Continue spironolactone 12.5 mg daily. BMET today. - If he remains off cocaine and EF stays low, would be ICD candidate (narrow QRS so no CRT).  3. CVA: 2/23, has residual right-sided weakness.  Thought to be cardioembolic at the time, started on anticoagulation.   - Using Eliquis given concerns with compliance with warfarin INR  checks. No abnormal bleeding. 4. Cocaine abuse: Last used 3 weeks ago.  - Discussed cessation. He says he is trying.  5. 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   Echo 9/23 shows moderate MS with trivial MR. Unusual given age. He denies known rheumatic fever though he remembers have strep throat at least twice as a child. L/RHC (9/23) showed moderate mitral stenosis, with mitral valve mean gradient 10.9 mmHg, MVA 1.74 cm^2.  He is not overly symptomatic. 6. Homeless: Staying behind gas. Has a dog. HFSW helping with resources. Gifted gift cards today. 7. Vestibular schwannoma: Has had vertigo and falls. No falls recently.    Continue paramedicine. Followup in 4 months with Dr. Aundra Dubin.  [name] [facility] [name]-BC 05/25/2022

## 2022-05-25 NOTE — Patient Instructions (Signed)
Labs done today. We will contact you only if your labs are abnormal.  No medication changes were made. Please continue all current medications as prescribed.  Your physician recommends that you schedule a follow-up appointment in: 4 months. Please contact our office in February to schedule a March 2024 appointment.   If you have any questions or concerns before your next appointment please send [place] a message through [place] or call our office at [phone].    TO LEAVE A MESSAGE FOR THE NURSE SELECT OPTION 2, PLEASE LEAVE A MESSAGE INCLUDING: YOUR NAME DATE OF BIRTH CALL BACK NUMBER REASON FOR CALL**this is important as we prioritize the call backs  YOU WILL RECEIVE A CALL BACK THE SAME DAY AS LONG AS YOU CALL BEFORE 4:00 PM   Do the following things EVERYDAY: Weigh yourself in the morning before breakfast. Write it down and keep it in a log. Take your medicines as prescribed Eat low salt foods--Limit salt (sodium) to 2000 mg per day.  Stay as active as you can everyday Limit all fluids for the day to less than 2 liters   At the [facility], you and your health needs are our priority. As part of our continuing mission to provide you with exceptional heart care, we have created designated Provider Care Teams. These Care Teams include your primary Cardiologist (physician) and Advanced Practice Providers (APPs- Physician Assistants and Nurse Practitioners) who all work together to provide you with the care you need, when you need it.   You may see any of the following providers on your designated Care Team at your next follow up: Dr Glori Bickers Dr Haynes Kerns, NP [name], [place] Audry Riles, PharmD   Please be sure to bring in all your medications bottles to every appointment.

## 2022-05-25 NOTE — Addendum Note (Signed)
Encounter addended by: Jorge Ny, LCSW on: 05/25/2022 3:56 PM  Actions taken: Flowsheet accepted, Clinical Note Signed

## 2022-05-25 NOTE — Progress Notes (Signed)
H&V Care Navigation CSW Progress Note  Clinical Social Worker consulted to check in regarding SDOH concerns.    Pt continues to be homeless- has new phone number so CSW emailed [employer] [facility] to update contact information.  Pt reports no longer getting food stamps due to not recertifying so CSW referred to [place] to help reapply.  Also provided $50 in gift cards to help with immediate insecurity.  Patient is participating in a Managed Medicaid Plan:  Yes  [place]: No Food Insecurity (09/19/2021)  Housing: High Risk (01/01/2022)  Transportation Needs: Unmet Transportation Needs (05/25/2022)  Alcohol Screen: High Risk (09/19/2021)  Depression (PHQ2-9): Low Risk  (03/05/2022)  Financial Resource Strain: High Risk (03/21/2022)  Tobacco Use: High Risk (05/25/2022)    Jorge Ny, [facility] Desk#: [phone] Cell#: [phone]

## 2022-05-27 NOTE — Progress Notes (Signed)
Paramedicine Encounter   Patient ID: Craig Schmidt , male,   DOB: [birth year],51 y.o.,  MRN: [id number]   Met patient in clinic today with provider.  Time spent with patient 0    Pt was no show this morning.  Rescheduled for 3:00 this afternoon  [name], [place] 05/27/2022

## 2022-05-31 ENCOUNTER — Encounter (HOSPITAL_COMMUNITY): Payer: Self-pay

## 2022-06-03 DIAGNOSIS — I213 ST elevation (STEMI) myocardial infarction of unspecified site: Secondary | ICD-10-CM | POA: Diagnosis not present

## 2022-06-03 DIAGNOSIS — I5043 Acute on chronic combined systolic (congestive) and diastolic (congestive) heart failure: Secondary | ICD-10-CM | POA: Diagnosis not present

## 2022-06-06 ENCOUNTER — Other Ambulatory Visit: Payer: Self-pay | Admitting: Internal Medicine

## 2022-06-06 DIAGNOSIS — M545 Low back pain, unspecified: Secondary | ICD-10-CM

## 2022-06-07 ENCOUNTER — Encounter: Payer: Self-pay | Admitting: Neurology

## 2022-06-07 ENCOUNTER — Inpatient Hospital Stay: Payer: Medicaid Other | Admitting: Neurology

## 2022-06-07 ENCOUNTER — Other Ambulatory Visit (HOSPITAL_COMMUNITY): Payer: Self-pay | Admitting: Emergency Medicine

## 2022-06-07 NOTE — Progress Notes (Signed)
Paramedicine Encounter    Patient ID: Craig Schmidt, male    DOB: 04/16/1971, 51 y.o.   MRN: [id number]   There were no vitals taken for this visit. Weight yesterday-not taken  Last visit weight-not taken  Did an unscheduled visit with Craig Schmidt today.  He was A&O x 4, skin W&D w/ good color.  Pt denies chest pain or SOB.  Lung sounds with mild expiratory wheezing.  No edema noted.  He has misplaced his scale so no weight taken.  I delivered his medication refills and his girlfriend [name] reconciles his box and I have reviewed box for accuracy.  He is not 100% compliant but does pretty well.  He has had no recent hospitalizations.   He does complain of some intermittent dizziness in the morning sometimes when he wakes up.  He admits to smoking crack in the past 24hrs.  His pulse palpated irregular and showed sinus tach w/ frequent PVC's.  I reviewed vitals and rhythm w/ [facility] and she advised no med changes at this time.  Visit complete.    [name], [place] 06/07/2022     Patient Care Team: [name], NP as PCP - General (Nurse Practitioner) Janina Mayo, MD as PCP - Cardiology (Cardiology)  Patient Active Problem List   Diagnosis Date Noted   Chronic combined systolic and diastolic heart failure ([place])    ST elevation myocardial infarction (STEMI) of inferolateral wall, subsequent episode of care ([place]) 12/19/2021   ST elevation myocardial infarction (STEMI) ([place])    Ischemic cardiomyopathy    Cocaine abuse ([place])    Smoker    Alcohol abuse    Acute ischemic stroke ([place]) 09/08/2021   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[place]) 05/20/2015   Tear of MCL (medial collateral ligament) of knee 05/20/2015   Protein-calorie malnutrition, severe 05/17/2015   Lactic acidosis 05/15/2015   Left knee pain 05/15/2015   Lung nodules 05/15/2015   Hypoglycemia 05/14/2015   Hypothermia 05/14/2015   Acute encephalopathy 05/14/2015   Cocaine abuse with  intoxication ([place]) 05/14/2015   Alcohol intoxication in active alcoholic ([place]) 19/14/7829   Sepsis ([place]) 05/14/2015   Homeless 05/14/2015    Current Outpatient Medications:    apixaban (ELIQUIS) 5 MG TABS tablet, Take 1 tablet (5 mg total) by mouth 2 (two) times daily., Disp: 60 tablet, Rfl: 5   atorvastatin (LIPITOR) 40 MG tablet, TAKE 1 TABLET(40 MG) BY MOUTH DAILY, Disp: 30 tablet, Rfl: 11   carvedilol (COREG) 6.25 MG tablet, Take 1 tablet (6.25 mg total) by mouth 2 (two) times daily., Disp: 180 tablet, Rfl: 3   clopidogrel (PLAVIX) 75 MG tablet, Take 1 tablet (75 mg total) by mouth daily., Disp: 30 tablet, Rfl: 6   digoxin (LANOXIN) 0.125 MG tablet, Take 1 tablet (0.125 mg total) by mouth daily., Disp: 30 tablet, Rfl: 6   empagliflozin (JARDIANCE) 10 MG TABS tablet, Take 1 tablet (10 mg total) by mouth daily., Disp: 30 tablet, Rfl: 6   famotidine (PEPCID) 20 MG tablet, Take 1 tablet (20 mg total) by mouth daily., Disp: 30 tablet, Rfl: 6   Glycerin-Hypromellose-PEG 400 0.2-0.2-1 % SOLN, Place 1 drop into both eyes as needed., Disp: 15 mL, Rfl: 0   isosorbide-hydrALAZINE (BIDIL) 20-37.5 MG tablet, Take 1 tablet by mouth 3 (three) times daily., Disp: 90 tablet, Rfl: 6   meloxicam (MOBIC) 7.5 MG tablet, TAKE 1 TABLET(7.5 MG) BY MOUTH DAILY, Disp: 30 tablet, Rfl: 1   sacubitril-valsartan (ENTRESTO) 49-51 MG, Take 1  tablet by mouth 2 (two) times daily., Disp: 60 tablet, Rfl: 3   spironolactone (ALDACTONE) 25 MG tablet, Take 0.5 tablets (12.5 mg total) by mouth daily., Disp: 45 tablet, Rfl: 3   triamcinolone ointment (KENALOG) 0.5 %, APPLY TOPICALLY TO THE AFFECTED AREA TWICE DAILY, Disp: 60 g, Rfl: 2 Allergies  Allergen Reactions   Shellfish Allergy Anaphylaxis   Peanut-Containing Drug Products Itching      Social History   Socioeconomic History   Marital status: Significant Other    Spouse name: Not on file   Number of children: 7   Years of education: Not on file   Highest  education level: 9th grade  Occupational History   Occupation: unemployed    Comment: experiencing homelessness-living at daily rent motels.  Tobacco Use   Smoking status: Every Day    Packs/day: 1.00    Years: 41.00    Total pack years: 41.00    Types: Cigarettes    Passive exposure: Current   Smokeless tobacco: Never  Vaping Use   Vaping Use: Never used  Substance and Sexual Activity   Alcohol use: Yes    Alcohol/week: 63.0 standard drinks of alcohol    Types: 63 Cans of beer per week    Comment: 3-40oz beer/day x10 years. 1-2 40oz beer/day in 20s/30s   Drug use: Yes    Types: "Crack" cocaine, Cocaine    Comment: last use 01/13/22   Sexual activity: Not on file  Other Topics Concern   Not on file  Social History Narrative   Not on file   Social Determinants of Health   Financial Resource Strain: High Risk (03/21/2022)   Overall Financial Resource Strain (CARDIA)    Difficulty of Paying Living Expenses: Very hard  Food Insecurity: No Food Insecurity (09/19/2021)   Hunger Vital Sign    Worried About Running Out of Food in the Last Year: Never true    Ran Out of Food in the Last Year: Never true  Transportation Needs: Unmet Transportation Needs (05/25/2022)   PRAPARE - Hydrologist (Medical): Yes    Lack of Transportation (Non-Medical): Yes  Physical Activity: Not on file  Stress: Not on file  Social Connections: Not on file  Intimate Partner Violence: Not on file    Physical Exam      Future Appointments  Date Time Provider [place]  06/13/2022  2:00 PM [name], MD [place] None       [name], [place] Paramedic  06/07/22

## 2022-06-12 ENCOUNTER — Other Ambulatory Visit (HOSPITAL_COMMUNITY): Payer: Self-pay | Admitting: Emergency Medicine

## 2022-06-12 NOTE — Progress Notes (Signed)
Had received a request for bus passes as Mr. Adkins has an appointment tomorrow @ [facility].  I was able to pick up passes from clinic and drop them by to Mr. Kirby Crigler today.  I also brought him some new boots and socks to wear as what he had been wearing were fabric and were saturated from the rain.      [name], [place] 06/12/2022

## 2022-06-13 ENCOUNTER — Ambulatory Visit (INDEPENDENT_AMBULATORY_CARE_PROVIDER_SITE_OTHER): Payer: Medicaid Other | Admitting: Student

## 2022-06-13 DIAGNOSIS — F17200 Nicotine dependence, unspecified, uncomplicated: Secondary | ICD-10-CM | POA: Diagnosis not present

## 2022-06-13 DIAGNOSIS — F1010 Alcohol abuse, uncomplicated: Secondary | ICD-10-CM | POA: Diagnosis not present

## 2022-06-13 DIAGNOSIS — F3130 Bipolar disorder, current episode depressed, mild or moderate severity, unspecified: Secondary | ICD-10-CM

## 2022-06-13 DIAGNOSIS — F1491 Cocaine use, unspecified, in remission: Secondary | ICD-10-CM | POA: Diagnosis not present

## 2022-06-13 DIAGNOSIS — F4310 Post-traumatic stress disorder, unspecified: Secondary | ICD-10-CM

## 2022-06-13 DIAGNOSIS — F1210 Cannabis abuse, uncomplicated: Secondary | ICD-10-CM

## 2022-06-13 MED ORDER — LAMOTRIGINE 25 MG PO TABS: 50.0000 mg | ORAL_TABLET | Freq: Every day | ORAL | 0 refills | Status: DC

## 2022-06-13 MED ORDER — NICOTINE 21 MG/24HR TD PT24: 21.0000 mg | MEDICATED_PATCH | TRANSDERMAL | 1 refills | Status: AC

## 2022-06-13 MED ORDER — LAMOTRIGINE 25 MG PO TABS: 25.0000 mg | ORAL_TABLET | Freq: Every day | ORAL | 0 refills | Status: DC

## 2022-06-13 NOTE — Progress Notes (Signed)
Psychiatric Initial Adult Assessment   Patient Identification: Craig Schmidt MRN:  [id number] Date of Evaluation:  06/20/2022 Referral Source: PCP, [name], NP Chief Complaint:   Chief Complaint  Patient presents with  . Establish Care  . Depression  . Anxiety   Visit Diagnosis:    ICD-10-CM   1. Bipolar disord, crnt epsd depress, mild or mod severt, unsp (HCC)  F31.30     2. PTSD (post-traumatic stress disorder)  F43.10     3. History of cocaine use  F14.91     4. Alcohol abuse  F10.10     5. Smoker  F17.200     6. Cannabis abuse  F12.10       History of Present Illness:  Craig Schmidt is a 51 year old male with a reported psychiatric history of bipolar disorder with psychotic features and PTSD as well as housing instability who presents to the [facility] to establish care. Patient was referred by cardiology provider and under the impression that he was referred here as a part of disability.   Used to be a boxer, head trauma, and had a tumor found approx 3 years ago.Played a role in stroke and heart attack (2023). Schwannoma. Gets "hateful." Stress of having to watch his back while on the streets. Dog is support system, as well as [place] and [place] (Cardiologist's office), GF now, sisters, step-mom and workers at the [employer].   31 mom, dad, and wife this year. Wife was killed. Starting to speak to kids again. Estranged from them while imprisoned.   Has racing thoughts every night. Planning for days ahead/ Worrying about wife. Nightmares. On average, 2-3 hours of sleep. Feels tired. Sometimes can go for a month or 2. While imprisoned, hallucinations of little green men and hearing voices. None since.   Passive SI with loss of wife, but knows that's not what she would want. Has wished to be dead, but not today. Denies HI.   Impulsivity in spending, at least 1 week around 18-20s, hypersexual, impulsive trips. Multiple  incarcerations. Decreased need for sleep. No substance use except marijuana.   Tobacco  1 ppd.  Alcohol 3-4 40 oz beers previously, down to 2 40oz beers daily.  Drugs- used to use cocaine last use earlier this month, now weed daily.   Past Psychiatric History: Saw psychiatrist in prison in 1999-2002. Diagnosed with bipolar disorder with psychotic features. PTSD, bipolar disorder ~2020. Homeless for about 35 years.  On Haldol and Thorazine at the time. No medications since released. OD attempt on pills around 2008-2010.   Previous Psychotropic Medications: {YES/NO:21197}  Substance Abuse History in the last 12 months:  {yes no:314532}  Consequences of Substance Abuse: {BHH CONSEQUENCES OF SUBSTANCE ABUSE:22880}  Past Medical History:  Past Medical History:  Diagnosis Date  . Asthma   . Brain tumor ([place])   . MI (myocardial infarction) ([place])   . Stroke [facility])     Past Surgical History:  Procedure Laterality Date  . BUBBLE STUDY  09/11/2021   Procedure: BUBBLE STUDY;  Surgeon: Fay Records, MD;  Location: [facility] ENDOSCOPY;  Service: Cardiovascular;;  . LEFT HEART CATH AND CORONARY ANGIOGRAPHY N/A 12/19/2021   Procedure: LEFT HEART CATH AND CORONARY ANGIOGRAPHY;  Surgeon: Belva Crome, MD;  Location: [place] CV LAB;  Service: Cardiovascular;  Laterality: N/A;  . RIGHT/LEFT HEART CATH AND CORONARY ANGIOGRAPHY N/A 04/13/2022   Procedure: RIGHT/LEFT HEART CATH AND CORONARY ANGIOGRAPHY;  Surgeon: [name], MD;  Location: [place] CV LAB;  Service: Cardiovascular;  Laterality: N/A;  . TEE WITHOUT CARDIOVERSION N/A 09/11/2021   Procedure: TRANSESOPHAGEAL ECHOCARDIOGRAM (TEE);  Surgeon: Fay Records, MD;  Location[facility] ENDOSCOPY;  Service: Cardiovascular;  Laterality: N/A;    Family Psychiatric History: ***  Family History: No family history on file.  Social History:   Social History   Socioeconomic History  . Marital status: Significant Other    Spouse name: Not on file  .  Number of children: 7  . Years of education: Not on file  . Highest education level: 9th grade  Occupational History  . Occupation: unemployed    Comment: experiencing homelessness-living at daily rent motels.  Tobacco Use  . Smoking status: Every Day    Packs/day: 1.00    Years: 41.00    Total pack years: 41.00    Types: Cigarettes    Passive exposure: Current  . Smokeless tobacco: Never  Vaping Use  . Vaping Use: Never used  Substance and Sexual Activity  . Alcohol use: Yes    Alcohol/week: 63.0 standard drinks of alcohol    Types: 63 Cans of beer per week    Comment: 3-40oz beer/day x10 years. 1-2 40oz beer/day in 20s/30s  . Drug use: Yes    Types: "Crack" cocaine, Cocaine    Comment: last use 01/13/22  . Sexual activity: Not on file  Other Topics Concern  . Not on file  Social History Narrative  . Not on file   Social Determinants of Health   Financial Resource Strain: High Risk (03/21/2022)   Overall Financial Resource Strain (CARDIA)   . Difficulty of Paying Living Expenses: Very hard  Food Insecurity: No Food Insecurity (09/19/2021)   Hunger Vital Sign   . Worried About Charity fundraiser in the Last Year: Never true   . Ran Out of Food in the Last Year: Never true  Transportation Needs: Unmet Transportation Needs (05/25/2022)   PRAPARE - Transportation   . Lack of Transportation (Medical): Yes   . Lack of Transportation (Non-Medical): Yes  Physical Activity: Not on file  Stress: Not on file  Social Connections: Not on file    Additional Social History: ***  Allergies:   Allergies  Allergen Reactions  . Shellfish Allergy Anaphylaxis  . Peanut-Containing Drug Products Itching    Metabolic Disorder Labs: Lab Results  Component Value Date   HGBA1C 5.6 12/19/2021   MPG 114.02 12/19/2021   MPG 114.02 09/09/2021   No results found for: "PROLACTIN" Lab Results  Component Value Date   CHOL 166 12/19/2021   TRIG 48 12/19/2021   HDL 61 12/19/2021    CHOLHDL 2.7 12/19/2021   VLDL 10 12/19/2021   LDLCALC 95 12/19/2021   LDLCALC 77 09/09/2021   Lab Results  Component Value Date   TSH 0.696 02/26/2022    Therapeutic Level Labs: No results found for: "LITHIUM" No results found for: "CBMZ" No results found for: "VALPROATE"  Current Medications: Current Outpatient Medications  Medication Sig Dispense Refill  . lamoTRIgine (LAMICTAL) 25 MG tablet Take 1 tablet (25 mg total) by mouth daily for 14 days. Then increase to 50 mg daily. 14 tablet 0  . [START ON 06/28/2022] lamoTRIgine (LAMICTAL) 25 MG tablet Take 2 tablets (50 mg total) by mouth daily for 14 days. 28 tablet 0  . nicotine (NICODERM CQ - DOSED IN MG/24 HOURS) 21 mg/24hr patch Place 1 patch (21 mg total) onto the skin daily. 30 patch 1  .  apixaban (ELIQUIS) 5 MG TABS tablet Take 1 tablet (5 mg total) by mouth 2 (two) times daily. 60 tablet 5  . atorvastatin (LIPITOR) 40 MG tablet TAKE 1 TABLET(40 MG) BY MOUTH DAILY 30 tablet 11  . carvedilol (COREG) 6.25 MG tablet Take 1 tablet (6.25 mg total) by mouth 2 (two) times daily. 180 tablet 3  . clopidogrel (PLAVIX) 75 MG tablet Take 1 tablet (75 mg total) by mouth daily. 30 tablet 6  . digoxin (LANOXIN) 0.125 MG tablet Take 1 tablet (0.125 mg total) by mouth daily. 30 tablet 6  . empagliflozin (JARDIANCE) 10 MG TABS tablet Take 1 tablet (10 mg total) by mouth daily. 30 tablet 6  . famotidine (PEPCID) 20 MG tablet Take 1 tablet (20 mg total) by mouth daily. 30 tablet 6  . Glycerin-Hypromellose-PEG 400 0.2-0.2-1 % SOLN Place 1 drop into both eyes as needed. 15 mL 0  . isosorbide-hydrALAZINE (BIDIL) 20-37.5 MG tablet Take 1 tablet by mouth 3 (three) times daily. 90 tablet 6  . meloxicam (MOBIC) 7.5 MG tablet TAKE 1 TABLET(7.5 MG) BY MOUTH DAILY 30 tablet 1  . sacubitril-valsartan (ENTRESTO) 49-51 MG Take 1 tablet by mouth 2 (two) times daily. 60 tablet 3  . spironolactone (ALDACTONE) 25 MG tablet Take 0.5 tablets (12.5 mg total) by mouth  daily. 45 tablet 3  . triamcinolone ointment (KENALOG) 0.5 % APPLY TOPICALLY TO THE AFFECTED AREA TWICE DAILY 60 g 2   No current facility-administered medications for this visit.    Musculoskeletal: Strength & Muscle Tone: {desc; muscle tone:32375} Gait & Station: {PE GAIT ED JEHU:31497} Patient leans: {Patient Leans:21022755}  Psychiatric Specialty Exam: Review of Systems  There were no vitals taken for this visit.There is no height or weight on file to calculate BMI.  General Appearance: {Appearance:22683}  Eye Contact:  {BHH EYE CONTACT:22684}  Speech:  {Speech:22685}  Volume:  {Volume (PAA):22686}  Mood:  {BHH MOOD:22306}  Affect:  {Affect (PAA):22687}  Thought Process:  {Thought Process (PAA):22688}  Orientation:  {BHH ORIENTATION (PAA):22689}  Thought Content:  {Thought Content:22690}  Suicidal Thoughts:  {ST/HT (PAA):22692}  Homicidal Thoughts:  {ST/HT (PAA):22692}  Memory:  {BHH MEMORY:22881}  Judgement:  {Judgement (PAA):22694}  Insight:  {Insight (PAA):22695}  Psychomotor Activity:  {Psychomotor (PAA):22696}  Concentration:  {Concentration:21399}  Recall:  {BHH GOOD/FAIR/POOR:22877}  Fund of Knowledge:{BHH GOOD/FAIR/POOR:22877}  Language: {BHH GOOD/FAIR/POOR:22877}  Akathisia:  {BHH YES OR NO:22294}  Handed:  {Handed:22697}  AIMS (if indicated):  {Desc; done/not:10129}  Assets:  {Assets (PAA):22698}  ADL's:  {BHH WYO'V:78588}  Cognition: {chl bhh cognition:304700322}  Sleep:  {BHH GOOD/FAIR/POOR:22877}   Screenings: AUDIT    Flowsheet Row ED to Hosp-Admission (Discharged) from 09/08/2021 in [facility]  Alcohol Use Disorder Identification Test Final Score (AUDIT) 16      CAGE-AID    Flowsheet Row ED to Hosp-Admission (Discharged) from 09/08/2021 in [place] Score 0      PHQ2-9    [place] Office Visit from 06/13/2022 in [facility] Office Visit from 03/05/2022 in  Primary Care at [facility] Visit from 01/24/2022 in Primary Care at [place] Visit from 05/20/2015 in [place]  PHQ-2 Total Score 3 0 0 0  PHQ-9 Total Score 15 -- -- --      [place] Office Visit from 06/13/2022 in [facility] Admission (Discharged) from 04/13/2022 in [place] CATH LAB ED from  02/05/2022 in [place] CATEGORY Error: Question 6 not populated No Risk No Risk       Assessment and Plan: ***  Collaboration of Care: {BH OP Collaboration of Care:21014065}  Patient/Guardian was advised Release of Information must be obtained prior to any record release in order to collaborate their care with an outside provider. Patient/Guardian was advised if they have not already done so to contact the registration department to sign all necessary forms in order for [place] to release information regarding their care.   Consent: Patient/Guardian gives verbal consent for treatment and assignment of benefits for services provided during this visit. Patient/Guardian expressed understanding and agreed to proceed.   [name], MD 11/29/202310:48 AM

## 2022-06-19 ENCOUNTER — Telehealth (HOSPITAL_COMMUNITY): Payer: Self-pay | Admitting: Emergency Medicine

## 2022-06-19 NOTE — Telephone Encounter (Signed)
Called Mr. Finigan to give him information regarding his [place] appointment Sat. 06/23/22  He is to go to [place] at 9:00.  I will send message to Tammy Sours to try and get him bus passes for this appointment.    [name], [place] 06/19/2022

## 2022-06-21 ENCOUNTER — Telehealth (HOSPITAL_COMMUNITY): Payer: Self-pay | Admitting: Licensed Clinical Social Worker

## 2022-06-21 NOTE — Telephone Encounter (Signed)
H&V Care Navigation CSW Progress Note  Clinical Social Worker contacted by pt to request assistance getting to appt on Saturday for disability determination.  CSW unable to schedule ride as it is not with [facility] and V[facility].  Pt unable to schedule Medicaid ride since its the weekend.  CSW provided bus passes to patient to assist with getting to appt.  Patient is participating in a Managed Medicaid Plan:  Yes  [place]: No Food Insecurity (09/19/2021)  Housing: High Risk (01/01/2022)  Transportation Needs: Unmet Transportation Needs (06/21/2022)  Alcohol Screen: High Risk (09/19/2021)  Depression (PHQ2-9): High Risk (06/13/2022)  Financial Resource Strain: High Risk (03/21/2022)  Tobacco Use: High Risk (05/25/2022)    Jorge Ny, LCSW Clinical Social Worker [facility] Desk#: [phone] Cell#: [phone]

## 2022-06-28 ENCOUNTER — Other Ambulatory Visit (HOSPITAL_COMMUNITY): Payer: Self-pay | Admitting: Emergency Medicine

## 2022-06-28 NOTE — Progress Notes (Signed)
Paramedicine Encounter    Patient ID: Craig Schmidt, male    DOB: [birth year], 51 y.o.   MRN: [id number]   Wt 170 lb 6.4 oz (77.3 kg)   BMI 27.09 kg/m  Weight yesterday-not taken Last visit weight-163lb  ATF [name] A&O x 4, skin W&D w/ good color.  Pt is still homeless and living in a tent.  He still says that he does not go to the [facility] at night because he can't take his dog Craig Schmidt.  He says he is taking his meds as prescribed and his girlfriend Craig Schmidt does his pill box for him.  I have reviewed box for accuracy and all has been good.  Difficult to get blood pressure today because it was extremely cold outside and he had multiple layers of clothing on.  He denies chest pain or SOB.  Lung sounds are clear and equal bilat.  No edema noted to his lower extremities.  I did pick up a prescription for him for Kenalog cream from [place] and delivered during our quick visit.  Home visit complete.    Craig Schmidt, [employer] 06/28/2022  Patient Care Team: Craig Herter, NP as PCP - General (Nurse Practitioner) Craig Mayo, MD as PCP - Cardiology (Cardiology)  Patient Active Problem List   Diagnosis Date Noted   Chronic combined systolic and diastolic heart failure ([place])    ST elevation myocardial infarction (STEMI) of inferolateral wall, subsequent episode of care ([place]) 12/19/2021   ST elevation myocardial infarction (STEMI) ([place])    Ischemic cardiomyopathy    Cocaine abuse ([place])    Smoker    Alcohol abuse    Acute ischemic stroke ([place]) 09/08/2021   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[place]) 05/20/2015   Tear of MCL (medial collateral ligament) of knee 05/20/2015   Protein-calorie malnutrition, severe 05/17/2015   Lactic acidosis 05/15/2015   Left knee pain 05/15/2015   Lung nodules 05/15/2015   Hypoglycemia 05/14/2015   Hypothermia 05/14/2015   Acute encephalopathy 05/14/2015   Cocaine abuse with intoxication ([place]) 05/14/2015   Alcohol intoxication in  active alcoholic ([place]) 19/37/9024   Sepsis ([place]) 05/14/2015   Homeless 05/14/2015    Current Outpatient Medications:    apixaban (ELIQUIS) 5 MG TABS tablet, Take 1 tablet (5 mg total) by mouth 2 (two) times daily., Disp: 60 tablet, Rfl: 5   atorvastatin (LIPITOR) 40 MG tablet, TAKE 1 TABLET(40 MG) BY MOUTH DAILY, Disp: 30 tablet, Rfl: 11   carvedilol (COREG) 6.25 MG tablet, Take 1 tablet (6.25 mg total) by mouth 2 (two) times daily., Disp: 180 tablet, Rfl: 3   clopidogrel (PLAVIX) 75 MG tablet, Take 1 tablet (75 mg total) by mouth daily., Disp: 30 tablet, Rfl: 6   digoxin (LANOXIN) 0.125 MG tablet, Take 1 tablet (0.125 mg total) by mouth daily., Disp: 30 tablet, Rfl: 6   empagliflozin (JARDIANCE) 10 MG TABS tablet, Take 1 tablet (10 mg total) by mouth daily., Disp: 30 tablet, Rfl: 6   famotidine (PEPCID) 20 MG tablet, Take 1 tablet (20 mg total) by mouth daily., Disp: 30 tablet, Rfl: 6   Glycerin-Hypromellose-PEG 400 0.2-0.2-1 % SOLN, Place 1 drop into both eyes as needed., Disp: 15 mL, Rfl: 0   isosorbide-hydrALAZINE (BIDIL) 20-37.5 MG tablet, Take 1 tablet by mouth 3 (three) times daily., Disp: 90 tablet, Rfl: 6   lamoTRIgine (LAMICTAL) 25 MG tablet, Take 1 tablet (25 mg total) by mouth daily for 14 days. Then increase to 50 mg daily., Disp: 14 tablet,  Rfl: 0   lamoTRIgine (LAMICTAL) 25 MG tablet, Take 2 tablets (50 mg total) by mouth daily for 14 days., Disp: 28 tablet, Rfl: 0   meloxicam (MOBIC) 7.5 MG tablet, TAKE 1 TABLET(7.5 MG) BY MOUTH DAILY, Disp: 30 tablet, Rfl: 1   nicotine (NICODERM CQ - DOSED IN MG/24 HOURS) 21 mg/24hr patch, Place 1 patch (21 mg total) onto the skin daily., Disp: 30 patch, Rfl: 1   sacubitril-valsartan (ENTRESTO) 49-51 MG, Take 1 tablet by mouth 2 (two) times daily., Disp: 60 tablet, Rfl: 3   spironolactone (ALDACTONE) 25 MG tablet, Take 0.5 tablets (12.5 mg total) by mouth daily., Disp: 45 tablet, Rfl: 3   triamcinolone ointment (KENALOG) 0.5 %, APPLY TOPICALLY  TO THE AFFECTED AREA TWICE DAILY, Disp: 60 g, Rfl: 2 Allergies  Allergen Reactions   Shellfish Allergy Anaphylaxis   Peanut-Containing Drug Products Itching      Social History   Socioeconomic History   Marital status: Significant Other    Spouse name: Not on file   Number of children: 7   Years of education: Not on file   Highest education level: 9th grade  Occupational History   Occupation: unemployed    Comment: experiencing homelessness-living at daily rent motels.  Tobacco Use   Smoking status: Every Day    Packs/day: 1.00    Years: 41.00    Total pack years: 41.00    Types: Cigarettes    Passive exposure: Current   Smokeless tobacco: Never  Vaping Use   Vaping Use: Never used  Substance and Sexual Activity   Alcohol use: Yes    Alcohol/week: 63.0 standard drinks of alcohol    Types: 63 Cans of beer per week    Comment: 3-40oz beer/day x10 years. 1-2 40oz beer/day in 20s/30s   Drug use: Yes    Types: "Crack" cocaine, Cocaine    Comment: last use 01/13/22   Sexual activity: Not on file  Other Topics Concern   Not on file  Social History Narrative   Not on file   Social Determinants of Health   Financial Resource Strain: High Risk (03/21/2022)   Overall Financial Resource Strain (CARDIA)    Difficulty of Paying Living Expenses: Very hard  Food Insecurity: No Food Insecurity (09/19/2021)   Hunger Vital Sign    Worried About Running Out of Food in the Last Year: Never true    Ran Out of Food in the Last Year: Never true  Transportation Needs: Unmet Transportation Needs (06/21/2022)   PRAPARE - Hydrologist (Medical): Yes    Lack of Transportation (Non-Medical): Yes  Physical Activity: Not on file  Stress: Not on file  Social Connections: Not on file  Intimate Partner Violence: Not on file    Physical Exam      Future Appointments  Date Time Provider [place]  07/11/2022  3:00 PM Alwyn Ren [facility]  None  07/11/2022  4:00 PM [name], MD [facility] None  09/12/2022  1:30 PM Garvin Fila, MD [facility] None       Craig Schmidt, EMT-P-Paramedic [place] Paramedic  06/28/22

## 2022-07-03 DIAGNOSIS — I5043 Acute on chronic combined systolic (congestive) and diastolic (congestive) heart failure: Secondary | ICD-10-CM | POA: Diagnosis not present

## 2022-07-03 DIAGNOSIS — I213 ST elevation (STEMI) myocardial infarction of unspecified site: Secondary | ICD-10-CM | POA: Diagnosis not present

## 2022-07-04 ENCOUNTER — Other Ambulatory Visit (HOSPITAL_COMMUNITY): Payer: Self-pay | Admitting: Emergency Medicine

## 2022-07-04 NOTE — Progress Notes (Signed)
Paramedicine Encounter    Patient ID: Craig Schmidt, male    DOB: March 22, 1971, 51 y.o.   MRN: [id number]   There were no vitals taken for this visit. Weight yesterday-not taken Last visit weight-170lb  Made an unscheduled visit to follow-up on complaint of feeling "dizzy, hot and weak" after taking his morning meds.  He states this only happens in the morning and lasts for approximately 30 minutes.  He does eat a little something when he takes his meds as to not take on an empty stomach.  He has no chest pain or SOB associated with these symptoms.  No neurological deficits noted other than pre-existing rt sided weakness from his previous stroke.  I advised him I would get this info back to the clinic ASAP.  Asked him if he felt he needed to go to the hospital now and he advised no that he felt fine.  Recommended if he began having further issues before the morning to call 911.    [name], [place] 07/04/2022   Patient Care Team: [name], NP as PCP - General (Nurse Practitioner) Janina Mayo, MD as PCP - Cardiology (Cardiology)  Patient Active Problem List   Diagnosis Date Noted   Chronic combined systolic and diastolic heart failure ([place])    ST elevation myocardial infarction (STEMI) of inferolateral wall, subsequent episode of care ([place]) 12/19/2021   ST elevation myocardial infarction (STEMI) ([place])    Ischemic cardiomyopathy    Cocaine abuse ([place])    Smoker    Alcohol abuse    Acute ischemic stroke ([place]) 09/08/2021   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[place]) 05/20/2015   Tear of MCL (medial collateral ligament) of knee 05/20/2015   Protein-calorie malnutrition, severe 05/17/2015   Lactic acidosis 05/15/2015   Left knee pain 05/15/2015   Lung nodules 05/15/2015   Hypoglycemia 05/14/2015   Hypothermia 05/14/2015   Acute encephalopathy 05/14/2015   Cocaine abuse with intoxication ([place]) 05/14/2015   Alcohol intoxication in active alcoholic ([place])  29/47/6546   Sepsis ([place]) 05/14/2015   Homeless 05/14/2015    Current Outpatient Medications:    apixaban (ELIQUIS) 5 MG TABS tablet, Take 1 tablet (5 mg total) by mouth 2 (two) times daily., Disp: 60 tablet, Rfl: 5   atorvastatin (LIPITOR) 40 MG tablet, TAKE 1 TABLET(40 MG) BY MOUTH DAILY, Disp: 30 tablet, Rfl: 11   carvedilol (COREG) 6.25 MG tablet, Take 1 tablet (6.25 mg total) by mouth 2 (two) times daily., Disp: 180 tablet, Rfl: 3   clopidogrel (PLAVIX) 75 MG tablet, Take 1 tablet (75 mg total) by mouth daily., Disp: 30 tablet, Rfl: 6   digoxin (LANOXIN) 0.125 MG tablet, Take 1 tablet (0.125 mg total) by mouth daily., Disp: 30 tablet, Rfl: 6   empagliflozin (JARDIANCE) 10 MG TABS tablet, Take 1 tablet (10 mg total) by mouth daily., Disp: 30 tablet, Rfl: 6   famotidine (PEPCID) 20 MG tablet, Take 1 tablet (20 mg total) by mouth daily., Disp: 30 tablet, Rfl: 6   Glycerin-Hypromellose-PEG 400 0.2-0.2-1 % SOLN, Place 1 drop into both eyes as needed., Disp: 15 mL, Rfl: 0   isosorbide-hydrALAZINE (BIDIL) 20-37.5 MG tablet, Take 1 tablet by mouth 3 (three) times daily., Disp: 90 tablet, Rfl: 6   lamoTRIgine (LAMICTAL) 25 MG tablet, Take 1 tablet (25 mg total) by mouth daily for 14 days. Then increase to 50 mg daily., Disp: 14 tablet, Rfl: 0   lamoTRIgine (LAMICTAL) 25 MG tablet, Take 2 tablets (50 mg total) by  mouth daily for 14 days., Disp: 28 tablet, Rfl: 0   meloxicam (MOBIC) 7.5 MG tablet, TAKE 1 TABLET(7.5 MG) BY MOUTH DAILY, Disp: 30 tablet, Rfl: 1   nicotine (NICODERM CQ - DOSED IN MG/24 HOURS) 21 mg/24hr patch, Place 1 patch (21 mg total) onto the skin daily., Disp: 30 patch, Rfl: 1   sacubitril-valsartan (ENTRESTO) 49-51 MG, Take 1 tablet by mouth 2 (two) times daily., Disp: 60 tablet, Rfl: 3   spironolactone (ALDACTONE) 25 MG tablet, Take 0.5 tablets (12.5 mg total) by mouth daily., Disp: 45 tablet, Rfl: 3   triamcinolone ointment (KENALOG) 0.5 %, APPLY TOPICALLY TO THE AFFECTED AREA  TWICE DAILY, Disp: 60 g, Rfl: 2 Allergies  Allergen Reactions   Shellfish Allergy Anaphylaxis   Peanut-Containing Drug Products Itching      Social History   Socioeconomic History   Marital status: Significant Other    Spouse name: Not on file   Number of children: 7   Years of education: Not on file   Highest education level: 9th grade  Occupational History   Occupation: unemployed    Comment: experiencing homelessness-living at daily rent motels.  Tobacco Use   Smoking status: Every Day    Packs/day: 1.00    Years: 41.00    Total pack years: 41.00    Types: Cigarettes    Passive exposure: Current   Smokeless tobacco: Never  Vaping Use   Vaping Use: Never used  Substance and Sexual Activity   Alcohol use: Yes    Alcohol/week: 63.0 standard drinks of alcohol    Types: 63 Cans of beer per week    Comment: 3-40oz beer/day x10 years. 1-2 40oz beer/day in 20s/30s   Drug use: Yes    Types: "Crack" cocaine, Cocaine    Comment: last use 01/13/22   Sexual activity: Not on file  Other Topics Concern   Not on file  Social History Narrative   Not on file   Social Determinants of Health   Financial Resource Strain: High Risk (03/21/2022)   Overall Financial Resource Strain (CARDIA)    Difficulty of Paying Living Expenses: Very hard  Food Insecurity: No Food Insecurity (09/19/2021)   Hunger Vital Sign    Worried About Running Out of Food in the Last Year: Never true    Ran Out of Food in the Last Year: Never true  Transportation Needs: Unmet Transportation Needs (06/21/2022)   PRAPARE - Hydrologist (Medical): Yes    Lack of Transportation (Non-Medical): Yes  Physical Activity: Not on file  Stress: Not on file  Social Connections: Not on file  Intimate Partner Violence: Not on file    Physical Exam      Future Appointments  Date Time Provider [place]  07/11/2022  3:00 PM Alwyn Ren [facility] None  07/11/2022   4:00 PM [name], MD [facility] None  09/12/2022  1:30 PM Garvin Fila, MD [facility] None       [name], EMT-P-Paramedic [place] [facility]  07/04/22

## 2022-07-05 ENCOUNTER — Telehealth (HOSPITAL_COMMUNITY): Payer: Self-pay | Admitting: Emergency Medicine

## 2022-07-05 ENCOUNTER — Telehealth (HOSPITAL_COMMUNITY): Payer: Self-pay | Admitting: Licensed Clinical Social Worker

## 2022-07-05 NOTE — Telephone Encounter (Signed)
CSW referred to assist patient with obtaining a BP cuff. CSW informed [name] Paramedic that cuff will arrive to clinic early next week for her to hand deliver to the patient. CSW available as needed. Craig Schmidt, [place], [place]

## 2022-07-05 NOTE — Telephone Encounter (Signed)
Followed up with Craig Schmidt regarding his complaints of dizziness and weakness that he experiences after taking his morning meds.  Spoke w/ Dr. Aundra Dubin and I was able to get him an appointment scheduled for 12/19 @ 10:00.  Craig Schmidt advised he was good with this appointment and had no conflicts and I will call him on Monday to remind him of Tuesday's appointment.    [name], [place] 07/05/2022

## 2022-07-09 NOTE — Progress Notes (Signed)
Advanced Heart Failure Clinic Note   Primary Care: [name], NP HF Cardiologist: Dr. Aundra Dubin  HPI: Craig Schmidt is a 51 y.o. 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   Admitted 02/23 with left MCA CVA s/p tenecteplase. Stroke felt to be likely secondary to cardiomyopathy. Echo EF 25-30%,k RV mildly reduced, RVSP 50 mmHg, moderate MS, mild to moderate MR, moderate to severe TR, dilated IVC. TEE 02/23: EF 25-30%, RV severely reduced, MV stenotic (? Parachute mitral valve), MVA 1.45 cm2 consistent with severe MS, moderate MR, no interatrial shunt, no LV or LAA thrombus. Cardiology consulted. CM likely d/t subtance abuse +/- uncontrolled HTN. Had been started on coumadin but later switched to eliquis d/t concern for compliance given his social situation.  Admitted 5/23 with acute inferolateral STEMI. Had used cocaine 24 hrs prior to presentation. Emergent LHC with 100% apical LAD suspected embolic vs plaque rupture with thrombosis. LVEDP 40. Echo this admit showed EF < 20%, ? RV okay. He was significantly volume overloaded and hypertensive. Started on lasix gtt and nitro gtt. Had good diuresis but Scr began to trend up, 1.5>1.77>1.87>2.0.  Developed soft BP and BP-active meds held. There was concern for low-output. PICC placed and Co-ox within normal range. Drips weaned and GDMT started. He was discharged home, weight 156 lbs.  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   R/LHC (9/23) arranged to assess mitral stenosis, which showed primarily pulmonary venous hypertension, normal RA pressure and minimally elevated PCWP, and moderate mitral stenosis.   Today he returns for HF follow up. Overall  feeling fine. He has SOB walking further distances on flat ground, no breathing issues walking up stairs. Feels occasional palpitations. Denies abnormal bleeding, CP, dizziness, edema, or PND/Orthopnea. Appetite ok. No fever or chills. He does not weigh at home. Followed by paramedicine. Misses several doses of medications during the week. Drinks 1 40 oz beer/day. Last used cocaine a few weeks ago. Homeless, lives behind a gas station.  ECG (personally reviewed): none ordered today.  Labs (6/23): K 3.8, creatinine 1.75 Labs (7/23): K 4.3, creatinine 1.39 Labs (8/23): digoxin 0.2, K 4.9, creatinine 1.37 Labs (9/23): K 4.3, creatinine 1.26  PMH: 1. CVA: Left MCA CVA in 2/23, treated with tenecteplase.  Thought to be due to low EF/cardiomyopathy.  2. Left vestibular schwannoma.  3. Hyperlipidemia 4. H/o cocaine abuse. 5. H/o ETOH abuse.  6. 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  Most recent echo in 9/23 showed moderate mitral stenosis with trivial MR.  - R/LHC (9/23) showed moderate mitral stenosis, mitral valve mean gradient 10.9 mmHg, MVA 1.74 cm^2 7. Chronic systolic CHF: Mixed ischemic/nonischemic CMP.  Cocaine/ETOH abuse, HTN, CAD.  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 - Inferolateral MI 5/23 with LHC showing occlusion of the apical LAD.  - R/LHC (9/23): RA mean 4, PA 50/20 (mean 33), PCWP 15, CO/CI (Fick) 5.74/3.06, PVR 3.1 WU 8.  CAD: Inferolateral STEMI in 5/23 with LHC showing occluded apical LAD managed medically (?embolic vs plaque rupture).   Past Medical History:  Diagnosis Date   Asthma    Brain tumor ([place])    MI (myocardial infarction) ([place])    Stroke  [facility])    Current Outpatient Medications  Medication Sig Dispense Refill   apixaban (ELIQUIS) 5 MG TABS tablet Take 1 tablet (5 mg total) by mouth 2 (two) times daily. 60 tablet 5   atorvastatin (LIPITOR) 40 MG tablet TAKE 1 TABLET(40 MG) BY MOUTH DAILY 30 tablet 11   carvedilol (COREG) 6.25 MG tablet Take 1 tablet (6.25 mg total) by mouth 2 (two) times daily. 180 tablet 3   clopidogrel (PLAVIX) 75 MG tablet Take 1 tablet (75 mg total) by mouth daily. 30 tablet 6   digoxin (LANOXIN) 0.125 MG tablet Take 1 tablet (0.125 mg total) by mouth daily. 30 tablet 6   empagliflozin (JARDIANCE) 10 MG TABS tablet Take 1 tablet (10 mg total) by mouth daily. 30 tablet 6   famotidine (PEPCID) 20 MG tablet Take 1 tablet (20 mg total) by mouth daily. 30 tablet 6   Glycerin-Hypromellose-PEG 400 0.2-0.2-1 % SOLN Place 1 drop into both eyes as needed. 15 mL 0   isosorbide-hydrALAZINE (BIDIL) 20-37.5 MG tablet Take 1 tablet by mouth 3 (three) times daily. 90 tablet 6   lamoTRIgine (LAMICTAL) 25 MG tablet Take 1 tablet (25 mg total) by mouth daily for 14 days. Then increase to 50 mg daily. 14 tablet 0   lamoTRIgine (LAMICTAL) 25 MG tablet Take 2 tablets (50 mg total) by mouth daily for 14 days. 28 tablet 0   meloxicam (MOBIC) 7.5 MG tablet TAKE 1 TABLET(7.5 MG) BY MOUTH DAILY 30 tablet 1   nicotine (NICODERM CQ - DOSED IN MG/24 HOURS) 21 mg/24hr patch Place 1 patch (21 mg total) onto the skin daily. 30 patch 1   sacubitril-valsartan (ENTRESTO) 49-51 MG Take 1 tablet by mouth 2 (two) times daily. 60 tablet 3   spironolactone (ALDACTONE) 25 MG tablet Take 0.5 tablets (12.5 mg total) by mouth daily. 45 tablet 3   triamcinolone ointment (KENALOG) 0.5 % APPLY TOPICALLY TO THE AFFECTED AREA TWICE DAILY 60 g 2   No current facility-administered medications for this visit.   Allergies  Allergen Reactions   Shellfish Allergy Anaphylaxis   Peanut-Containing Drug Products Itching   Social History   Socioeconomic History    Marital status: Significant Other    Spouse name: Not on file   Number of children: 7   Years of education: Not on file   Highest education level: 9th grade  Occupational History   Occupation: unemployed    Comment: experiencing homelessness-living at daily rent motels.  Tobacco Use   Smoking status: Every Day    Packs/day: 1.00    Years: 41.00    Total pack years: 41.00    Types: Cigarettes    Passive exposure: Current   Smokeless tobacco: Never  Vaping Use   Vaping Use: Never used  Substance and Sexual Activity   Alcohol use: Yes    Alcohol/week: 63.0 standard drinks of alcohol    Types: 63 Cans of beer per week    Comment: 3-40oz beer/day x10 years. 1-2 40oz beer/day in 20s/30s   Drug use: Yes    Types: "Crack" cocaine, Cocaine    Comment: last use 01/13/22   Sexual activity: Not on file  Other Topics Concern   Not on file  Social History  Narrative   Not on file   Social Determinants of Health   Financial Resource Strain: High Risk (03/21/2022)   Overall Financial Resource Strain (CARDIA)    Difficulty of Paying Living Expenses: Very hard  Food Insecurity: No Food Insecurity (09/19/2021)   Hunger Vital Sign    Worried About Running Out of Food in the Last Year: Never true    Ran Out of Food in the Last Year: Never true  Transportation Needs: Unmet Transportation Needs (06/21/2022)   PRAPARE - Hydrologist (Medical): Yes    Lack of Transportation (Non-Medical): Yes  Physical Activity: Not on file  Stress: Not on file  Social Connections: Not on file  Intimate Partner Violence: Not on file   No family history on file.  There were no vitals taken for this visit.  Wt Readings from Last 3 Encounters:  06/28/22 77.3 kg (170 lb 6.4 oz)  05/25/22 74.3 kg (163 lb 12.8 oz)  04/04/22 76.2 kg (168 lb)   PHYSICAL EXAM: General:  NAD. No resp difficulty, walked into clinic HEENT: Normal Neck: Supple. No JVD. Carotids 2+ bilat; no bruits.  No lymphadenopathy or thryomegaly appreciated. Cor: PMI nondisplaced. Regular rate & rhythm. No rubs, gallops or murmurs. Lungs: Clear Abdomen: Soft, nontender, nondistended. No hepatosplenomegaly. No bruits or masses. Good bowel sounds. Extremities: No cyanosis, clubbing, rash, edema Neuro: Alert & oriented x 3, cranial nerves grossly intact. Moves all 4 extremities w/o difficulty. Affect pleasant.  ASSESSMENT & PLAN: 1. CAD: Admit 6/23 with inferolateral STEMI by ECG, cath showed occluded apical LAD.  Cannot rule out plaque rupture in setting of cocaine abuse, but given history of suspected cardioembolic CVA, wonder if this was not a cardioembolic MI.  No chest pain.  - Continue atorvastatin  - Continue Plavix (post-MI, continue 1 year as long as no concerning bleeding) + Eliquis.  Think he should remain anticoagulated (was supposed to be on prior to admission but not sure if he was taking) given suspected cardioembolism.  2. Chronic systolic CHF: Patient was found to have cardiomyopathy in 2/23 at time of admission for CVA.  Echo showed EF 25-30% at that time.  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  MI in 7/41 (?cardioembolic) also contributes.  Echo in 5/23 showed LV EF < 20%, RV mildly decreased function, abnormal mitral valve looks rheumatic => suspect moderate mitral stenosis at least with planimetered MVA 1.6 cm^2 and calculated MVA by VTI 0.8 cm^2; mean gradient only 6 mmHg but likely function of low cardiac output. The IVC was small/non-dilated. Echo 9/23 showed EF 30-35% with mildly decreased RV systolic function. RHC (9/23) showed normal RA pressure, mildly elevated PCWP.  NYHA class II symptoms.  He is not volume overloaded on exam.  - Continue Entresto 49/51 mg bid.  - Continue digoxin 0.125. Dig level 0.3 04/04/22. - Continue Bidil 1 tab tid. - Continue Coreg 6.25 mg bid.  - Continue Jardiance 10 mg daily.   - Continue  spironolactone 12.5 mg daily. BMET today. - If he remains off cocaine and EF stays low, would be ICD candidate (narrow QRS so no CRT).  3. CVA: 2/23, has residual right-sided weakness.  Thought to be cardioembolic at the time, started on anticoagulation.   - Using Eliquis given concerns with compliance with warfarin INR checks. No abnormal bleeding. 4. Cocaine abuse: Last used 3 weeks ago.  - Discussed cessation. He says he is trying.  5. 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   Echo 9/23 shows moderate MS with trivial MR. Unusual given age. He denies known rheumatic fever though he remembers have strep throat at least twice as a child. L/RHC (9/23) showed moderate mitral stenosis, with mitral valve mean gradient 10.9 mmHg, MVA 1.74 cm^2. He is not overly symptomatic. 6. Homeless: Staying behind gas. Has a dog. HFSW helping with resources. Gifted gift cards today. 7. Vestibular schwannoma: Has had vertigo and falls. No falls recently.    Continue paramedicine. Followup in 4 months with Dr. Aundra Dubin.  [name] [facility] [name]-BC 07/09/2022

## 2022-07-10 ENCOUNTER — Encounter (HOSPITAL_COMMUNITY): Payer: Self-pay

## 2022-07-10 ENCOUNTER — Encounter (HOSPITAL_COMMUNITY): Payer: Self-pay | Admitting: Emergency Medicine

## 2022-07-10 ENCOUNTER — Ambulatory Visit (HOSPITAL_COMMUNITY)
Admission: RE | Admit: 2022-07-10 | Discharge: 2022-07-10 | Disposition: A | Discharge status: Home / Self Care | Payer: Medicaid Other | Source: Ambulatory Visit | Attending: Family Medicine | Admitting: Family Medicine

## 2022-07-10 ENCOUNTER — Other Ambulatory Visit (HOSPITAL_COMMUNITY): Payer: Self-pay

## 2022-07-10 ENCOUNTER — Telehealth (HOSPITAL_COMMUNITY): Payer: Self-pay | Admitting: Vascular Surgery

## 2022-07-10 ENCOUNTER — Other Ambulatory Visit: Payer: Self-pay | Admitting: Internal Medicine

## 2022-07-10 ENCOUNTER — Other Ambulatory Visit (HOSPITAL_COMMUNITY): Payer: Self-pay | Admitting: Adult Health

## 2022-07-10 ENCOUNTER — Telehealth (HOSPITAL_COMMUNITY): Payer: Self-pay | Admitting: Emergency Medicine

## 2022-07-10 ENCOUNTER — Other Ambulatory Visit (HOSPITAL_COMMUNITY): Payer: Self-pay | Admitting: Emergency Medicine

## 2022-07-10 VITALS — BP 160/110 | HR 92 | Ht 67.0 in | Wt 168.0 lb

## 2022-07-10 DIAGNOSIS — E785 Hyperlipidemia, unspecified: Secondary | ICD-10-CM | POA: Diagnosis not present

## 2022-07-10 DIAGNOSIS — I69351 Hemiplegia and hemiparesis following cerebral infarction affecting right dominant side: Secondary | ICD-10-CM | POA: Diagnosis not present

## 2022-07-10 DIAGNOSIS — R002 Palpitations: Secondary | ICD-10-CM | POA: Diagnosis not present

## 2022-07-10 DIAGNOSIS — F1410 Cocaine abuse, uncomplicated: Secondary | ICD-10-CM | POA: Diagnosis not present

## 2022-07-10 DIAGNOSIS — R531 Weakness: Secondary | ICD-10-CM | POA: Diagnosis not present

## 2022-07-10 DIAGNOSIS — F17210 Nicotine dependence, cigarettes, uncomplicated: Secondary | ICD-10-CM | POA: Diagnosis not present

## 2022-07-10 DIAGNOSIS — Z791 Long term (current) use of non-steroidal anti-inflammatories (NSAID): Secondary | ICD-10-CM | POA: Diagnosis not present

## 2022-07-10 DIAGNOSIS — I050 Rheumatic mitral stenosis: Secondary | ICD-10-CM | POA: Diagnosis not present

## 2022-07-10 DIAGNOSIS — I110 Hypertensive heart disease with heart failure: Secondary | ICD-10-CM | POA: Diagnosis not present

## 2022-07-10 DIAGNOSIS — Z7984 Long term (current) use of oral hypoglycemic drugs: Secondary | ICD-10-CM | POA: Insufficient documentation

## 2022-07-10 DIAGNOSIS — Z5900 Homelessness unspecified: Secondary | ICD-10-CM | POA: Insufficient documentation

## 2022-07-10 DIAGNOSIS — Z8673 Personal history of transient ischemic attack (TIA), and cerebral infarction without residual deficits: Secondary | ICD-10-CM | POA: Diagnosis not present

## 2022-07-10 DIAGNOSIS — I252 Old myocardial infarction: Secondary | ICD-10-CM | POA: Diagnosis not present

## 2022-07-10 DIAGNOSIS — I5022 Chronic systolic (congestive) heart failure: Secondary | ICD-10-CM | POA: Insufficient documentation

## 2022-07-10 DIAGNOSIS — I160 Hypertensive urgency: Secondary | ICD-10-CM | POA: Insufficient documentation

## 2022-07-10 DIAGNOSIS — I2510 Atherosclerotic heart disease of native coronary artery without angina pectoris: Secondary | ICD-10-CM | POA: Diagnosis not present

## 2022-07-10 DIAGNOSIS — R42 Dizziness and giddiness: Secondary | ICD-10-CM | POA: Insufficient documentation

## 2022-07-10 DIAGNOSIS — Z79899 Other long term (current) drug therapy: Secondary | ICD-10-CM | POA: Diagnosis not present

## 2022-07-10 DIAGNOSIS — G8929 Other chronic pain: Secondary | ICD-10-CM

## 2022-07-10 DIAGNOSIS — Z7901 Long term (current) use of anticoagulants: Secondary | ICD-10-CM | POA: Insufficient documentation

## 2022-07-10 DIAGNOSIS — D333 Benign neoplasm of cranial nerves: Secondary | ICD-10-CM | POA: Diagnosis not present

## 2022-07-10 DIAGNOSIS — Z86018 Personal history of other benign neoplasm: Secondary | ICD-10-CM | POA: Insufficient documentation

## 2022-07-10 DIAGNOSIS — F1910 Other psychoactive substance abuse, uncomplicated: Secondary | ICD-10-CM

## 2022-07-10 LAB — CBC
HCT: 44.2 % (ref 39.0–52.0)
Hemoglobin: 14.6 g/dL (ref 13.0–17.0)
MCH: 33.3 pg (ref 26.0–34.0)
MCHC: 33 g/dL (ref 30.0–36.0)
MCV: 100.9 fL — ABNORMAL HIGH (ref 80.0–100.0)
Platelets: 247 10*3/uL (ref 150–400)
RBC: 4.38 MIL/uL (ref 4.22–5.81)
RDW: 13.2 % (ref 11.5–15.5)
WBC: 8.5 10*3/uL (ref 4.0–10.5)
nRBC: 0 % (ref 0.0–0.2)

## 2022-07-10 LAB — BASIC METABOLIC PANEL
Anion gap: 7 (ref 5–15)
BUN: 14 mg/dL (ref 6–20)
CO2: 25 mmol/L (ref 22–32)
Calcium: 9.3 mg/dL (ref 8.9–10.3)
Chloride: 105 mmol/L (ref 98–111)
Creatinine, Ser: 1.59 mg/dL — ABNORMAL HIGH (ref 0.61–1.24)
GFR, Estimated: 53 mL/min — ABNORMAL LOW (ref >60)
Glucose, Bld: 106 mg/dL — ABNORMAL HIGH (ref 70–99)
Potassium: 4.4 mmol/L (ref 3.5–5.1)
Sodium: 137 mmol/L (ref 135–145)

## 2022-07-10 LAB — BRAIN NATRIURETIC PEPTIDE: B Natriuretic Peptide: 1224 pg/mL — ABNORMAL HIGH (ref 0.0–100.0)

## 2022-07-10 LAB — DIGOXIN LEVEL: Digoxin Level: <0.2 ng/mL — ABNORMAL LOW (ref 0.8–2.0)

## 2022-07-10 MED ORDER — POTASSIUM CHLORIDE CRYS ER 20 MEQ PO TBCR
20.0000 meq | EXTENDED_RELEASE_TABLET | Freq: Every day | ORAL | 11 refills | Status: DC
  Filled 2022-07-10: qty 30, 30d supply, fill #0

## 2022-07-10 MED ORDER — TORSEMIDE 20 MG PO TABS
20.0000 mg | ORAL_TABLET | Freq: Every day | ORAL | 11 refills | Status: DC
  Filled 2022-07-10: qty 30, 30d supply, fill #0

## 2022-07-10 MED ORDER — HYDRALAZINE HCL 50 MG PO TABS
50.0000 mg | ORAL_TABLET | Freq: Once | ORAL | Status: AC
  Administered 2022-07-10: 50 mg via ORAL

## 2022-07-10 NOTE — Progress Notes (Signed)
CSW provided bus passes to patient for transport home. Patient grateful for the support. Raquel Sarna, [place], [place]

## 2022-07-10 NOTE — Telephone Encounter (Signed)
Spoke to pt to change time from 2pm 1/4 appt to 130

## 2022-07-10 NOTE — Patient Instructions (Addendum)
Start Torsemide 20 mg daily Start potassium 20 meq daily Labs today - will call you if abnormal; please answer your phone. Repeat labs in 10 days Return to Heart Failure APP clinic in 2 weeks Return to see Dr. Aundra Dubin in 3 months Patient given Hydralazine 50 mg po at today's visit with re-check of BP

## 2022-07-10 NOTE — Addendum Note (Signed)
Encounter addended by: Louann Liv, LCSW on: 07/10/2022 3:22 PM  Actions taken: Flowsheet accepted, Clinical Note Signed

## 2022-07-10 NOTE — Progress Notes (Signed)
Picked up refills from [facility] Famotidine Lamotrigine Bidil Spironolactone Eliquis Jardiance Entresto Digoxin Carvedilol Atorvastatin Triamcinolone  Will deliver to him this afternoon.    [name], [place] 07/10/2022

## 2022-07-10 NOTE — Telephone Encounter (Signed)
Called w/ no answer.  LVM to remind him of clinic appointment this morning @ 10:00.    [name], [place] 07/10/2022

## 2022-07-10 NOTE — Progress Notes (Signed)
162/110   

## 2022-07-10 NOTE — Progress Notes (Signed)
ReDS Vest / Clip - 07/10/22 1024       ReDS Vest / Clip   Station Marker A    Ruler Value 28    ReDS Value Range High volume overload    ReDS Actual Value 50

## 2022-07-11 ENCOUNTER — Encounter (HOSPITAL_COMMUNITY): Payer: Medicaid Other | Admitting: Student

## 2022-07-11 ENCOUNTER — Encounter (HOSPITAL_COMMUNITY): Payer: Self-pay | Admitting: Emergency Medicine

## 2022-07-11 ENCOUNTER — Telehealth (HOSPITAL_COMMUNITY): Payer: Self-pay

## 2022-07-11 ENCOUNTER — Ambulatory Visit (HOSPITAL_COMMUNITY): Payer: Medicaid Other | Admitting: Mental Health

## 2022-07-11 MED ORDER — ISOSORB DINITRATE-HYDRALAZINE 20-37.5 MG PO TABS: ORAL_TABLET | ORAL | 5 refills | Status: DC

## 2022-07-11 NOTE — Progress Notes (Signed)
Reviewed with pt. Med change for 1 1/2 tablet Bidil TID.  He verbalizes he understands.  Demonstrated how to properly use his BP cuff with his girlfriend [name] because she is the one that will have to assist him as he doesn't have use of his rt arm due to previous stroke.    [name], [place] 07/11/2022

## 2022-07-11 NOTE — Progress Notes (Signed)
Reached out to HF Triage to advise Mr. Sachse's blood pressure was 180/120 w/ HR 76.  He says he has taken his meds this morning.  Received orders to increase Bidil to 1 1/2 tablet TID.  Will reach out to pt. This afternoon to advise of changes for med rec.    [name], [place] 07/11/2022

## 2022-07-11 NOTE — Telephone Encounter (Signed)
[place] paramedic came into the office stating that patients blood pressure was 180/120. Hear rate is 76. Denies chest pain, shortness of breath. Spoke with Allena Katz, NP and she states patient should increase bidil to 1 & 1/2 tablets tid. Dede advised and verbalized understanding.   Meds ordered this encounter  Medications   isosorbide-hydrALAZINE (BIDIL) 20-37.5 MG tablet    Sig: Take 1 & 1/2 tablets by mouth 3 times daily.    Dispense:  115 tablet    Refill:  5    Please cancel all previous orders for current medication. Change in dosage or pill size.

## 2022-07-12 ENCOUNTER — Telehealth (HOSPITAL_COMMUNITY): Payer: Self-pay | Admitting: Licensed Clinical Social Worker

## 2022-07-12 NOTE — Telephone Encounter (Signed)
CSW assisted in placing another order of incontinence supplies to be shipped to patients family members home.  Will continue to follow and assist as needed.  Jorge Ny, LCSW Clinical Social Worker Advanced Heart Failure Clinic Desk#: [phone] Cell#: [phone]

## 2022-07-17 ENCOUNTER — Other Ambulatory Visit (HOSPITAL_COMMUNITY): Payer: Self-pay

## 2022-07-17 ENCOUNTER — Emergency Department (HOSPITAL_COMMUNITY)
Admission: EM | Admit: 2022-07-17 | Discharge: 2022-07-18 | Disposition: A | Discharge status: Home / Self Care | Payer: Medicaid Other | Attending: Emergency Medicine | Admitting: Emergency Medicine

## 2022-07-17 ENCOUNTER — Telehealth (HOSPITAL_COMMUNITY): Payer: Self-pay | Admitting: Emergency Medicine

## 2022-07-17 DIAGNOSIS — Z1152 Encounter for screening for COVID-19: Secondary | ICD-10-CM | POA: Insufficient documentation

## 2022-07-17 DIAGNOSIS — Z7901 Long term (current) use of anticoagulants: Secondary | ICD-10-CM | POA: Insufficient documentation

## 2022-07-17 DIAGNOSIS — R531 Weakness: Secondary | ICD-10-CM

## 2022-07-17 DIAGNOSIS — Z91010 Allergy to peanuts: Secondary | ICD-10-CM | POA: Insufficient documentation

## 2022-07-17 DIAGNOSIS — Z8673 Personal history of transient ischemic attack (TIA), and cerebral infarction without residual deficits: Secondary | ICD-10-CM | POA: Diagnosis not present

## 2022-07-17 DIAGNOSIS — I10 Essential (primary) hypertension: Secondary | ICD-10-CM | POA: Diagnosis not present

## 2022-07-17 DIAGNOSIS — R0981 Nasal congestion: Secondary | ICD-10-CM | POA: Diagnosis present

## 2022-07-17 DIAGNOSIS — Z79899 Other long term (current) drug therapy: Secondary | ICD-10-CM | POA: Insufficient documentation

## 2022-07-17 DIAGNOSIS — J111 Influenza due to unidentified influenza virus with other respiratory manifestations: Secondary | ICD-10-CM | POA: Diagnosis not present

## 2022-07-17 DIAGNOSIS — R059 Cough, unspecified: Secondary | ICD-10-CM | POA: Diagnosis not present

## 2022-07-17 DIAGNOSIS — J101 Influenza due to other identified influenza virus with other respiratory manifestations: Secondary | ICD-10-CM | POA: Insufficient documentation

## 2022-07-17 DIAGNOSIS — R509 Fever, unspecified: Secondary | ICD-10-CM | POA: Diagnosis not present

## 2022-07-17 LAB — CBC
HCT: 42 % (ref 39.0–52.0)
Hemoglobin: 14.2 g/dL (ref 13.0–17.0)
MCH: 32.8 pg (ref 26.0–34.0)
MCHC: 33.8 g/dL (ref 30.0–36.0)
MCV: 97 fL (ref 80.0–100.0)
Platelets: 242 10*3/uL (ref 150–400)
RBC: 4.33 MIL/uL (ref 4.22–5.81)
RDW: 12.6 % (ref 11.5–15.5)
WBC: 5.4 10*3/uL (ref 4.0–10.5)
nRBC: 0 % (ref 0.0–0.2)

## 2022-07-17 LAB — URINALYSIS, ROUTINE W REFLEX MICROSCOPIC
Bilirubin Urine: NEGATIVE
Glucose, UA: >=500 mg/dL — AB
Hgb urine dipstick: NEGATIVE
Ketones, ur: NEGATIVE mg/dL
Leukocytes,Ua: NEGATIVE
Nitrite: NEGATIVE
Protein, ur: 100 mg/dL — AB
Specific Gravity, Urine: 1.023 (ref 1.005–1.030)
pH: 5 (ref 5.0–8.0)

## 2022-07-17 LAB — BASIC METABOLIC PANEL
Anion gap: 10 (ref 5–15)
BUN: 23 mg/dL — ABNORMAL HIGH (ref 6–20)
CO2: 24 mmol/L (ref 22–32)
Calcium: 8.8 mg/dL — ABNORMAL LOW (ref 8.9–10.3)
Chloride: 102 mmol/L (ref 98–111)
Creatinine, Ser: 1.91 mg/dL — ABNORMAL HIGH (ref 0.61–1.24)
GFR, Estimated: 42 mL/min — ABNORMAL LOW (ref >60)
Glucose, Bld: 103 mg/dL — ABNORMAL HIGH (ref 70–99)
Potassium: 4.1 mmol/L (ref 3.5–5.1)
Sodium: 136 mmol/L (ref 135–145)

## 2022-07-17 NOTE — ED Triage Notes (Signed)
Pt reports weakness after getting his medications today around 11. Pt says he is homeless and sleeps in a tent. Also having chest pain and bilateral arm numbness. Pt states the meds he is own takes him feel bad so he doesn't always take them.

## 2022-07-17 NOTE — ED Provider Triage Note (Signed)
Emergency Medicine Provider Triage Evaluation Note  Craig Schmidt , a 51 y.o. male  was evaluated in triage.  Pt complains of feeling weak, clammy, dizzy after taking his heart medications.  Patient states that he has been on medications due to having an MI and a stroke within a matter of months.  He states he feels that he is overmedicated at this time and feels tired after taking send medications.  He denies any chest pain, shortness of breath, abdominal pain, nausea, vomiting  Review of Systems  Positive: As above Negative: As above  Physical Exam  BP 107/78 (BP Location: Left Arm)   Pulse 89   Temp 98.9 F (37.2 C) (Oral)   Resp 19   SpO2 97%  Gen:   Awake, no distress   Resp:  Normal effort  MSK:   Moves extremities without difficulty  Other:    Medical Decision Making  Medically screening exam initiated at 4:15 PM.  Appropriate orders placed.  Jairon Ripberger Zazueta was informed that the remainder of the evaluation will be completed by another provider, this initial triage assessment does not replace that evaluation, and the importance of remaining in the ED until their evaluation is complete.     Dorothyann Peng, [name] 07/17/22 1622

## 2022-07-17 NOTE — Telephone Encounter (Signed)
Craig Schmidt called me this morning to let me know he had a virus over the weekend.  He ran a fever and took Tylenol for same.  He says he is feeling some better today but still doesn't have much of an appetite.  He did not take his Bidil over the weekend because he was unsure if he could take it and Tylenol.  He also asked me to come and review his pill box this morning.  I advised him I would stop by later this morning.    [name], [place] 07/17/2022

## 2022-07-18 ENCOUNTER — Emergency Department (HOSPITAL_COMMUNITY): Payer: Medicaid Other

## 2022-07-18 DIAGNOSIS — R059 Cough, unspecified: Secondary | ICD-10-CM | POA: Diagnosis not present

## 2022-07-18 DIAGNOSIS — R509 Fever, unspecified: Secondary | ICD-10-CM | POA: Diagnosis not present

## 2022-07-18 LAB — RESP PANEL BY RT-PCR (RSV, FLU A&B, COVID)  RVPGX2
Influenza A by PCR: POSITIVE — AB
Influenza B by PCR: NEGATIVE
Resp Syncytial Virus by PCR: NEGATIVE
SARS Coronavirus 2 by RT PCR: NEGATIVE

## 2022-07-18 LAB — DIGOXIN LEVEL: Digoxin Level: <0.2 ng/mL — ABNORMAL LOW (ref 0.8–2.0)

## 2022-07-18 MED ORDER — LACTATED RINGERS IV BOLUS
250.0000 mL | Freq: Once | INTRAVENOUS | Status: AC
  Administered 2022-07-18: 250 mL via INTRAVENOUS

## 2022-07-18 NOTE — ED Provider Notes (Signed)
[facility] EMERGENCY DEPARTMENT Provider Note   CSN: [id number] Arrival date & time: 07/17/22  1524     History  Chief Complaint  Patient presents with   Weakness    ERUBIEL MANASCO is a 51 y.o. male.  HPI 51 year old male history of stroke, hypertension, ST elevation MI, presents today complaining of viral syndrome which she states began over the weekend.  He states he began having some nasal congestion on Saturday followed by some diarrhea and coughing.  He continues to have cough with some associated dyspnea.  He is homeless and is followed by a community team.  He reports he was unable to take some of his medicine due to not feeling well and taking acetaminophen over the weekend.  He reports that he was started on 11 medications at once.  He states that sometimes after he takes all of his medications he feels weak in his left arm.  He has residual deficits from his stroke with weakness of his right arm and leg.  He is not having any new weakness at this time.    Home Medications Prior to Admission medications   Medication Sig Start Date End Date Taking? Authorizing Provider  apixaban (ELIQUIS) 5 MG TABS tablet Take 1 tablet (5 mg total) by mouth 2 (two) times daily. 12/27/21   Clegg, [name], NP  atorvastatin (LIPITOR) 40 MG tablet TAKE 1 TABLET(40 MG) BY MOUTH DAILY 04/23/22   [name], MD  carvedilol (COREG) 6.25 MG tablet Take 1 tablet (6.25 mg total) by mouth 2 (two) times daily. 04/04/22   [name], MD  clopidogrel (PLAVIX) 75 MG tablet TAKE ONE TABLET BY MOUTH ONCE DAILY 07/10/22   Clegg, [name], NP  digoxin (LANOXIN) 0.125 MG tablet Take 1 tablet (0.125 mg total) by mouth daily. 12/28/21   Clegg, [name], NP  empagliflozin (JARDIANCE) 10 MG TABS tablet Take 1 tablet (10 mg total) by mouth daily. 12/28/21   Clegg, [name], NP  famotidine (PEPCID) 20 MG tablet Take 1 tablet (20 mg total) by mouth daily. 12/28/21   Clegg, [name], NP   Glycerin-Hypromellose-PEG 400 0.2-0.2-1 % SOLN Place 1 drop into both eyes as needed. 12/27/21   Clegg, [name], NP  isosorbide-hydrALAZINE (BIDIL) 20-37.5 MG tablet Take 1 & 1/2 tablets by mouth 3 times daily. 07/11/22   Milford, [name], [name]  lamoTRIgine (LAMICTAL) 25 MG tablet Take 1 tablet (25 mg total) by mouth daily for 14 days. Then increase to 50 mg daily. 06/13/22 07/10/22  [name], MD  lamoTRIgine (LAMICTAL) 25 MG tablet Take 2 tablets (50 mg total) by mouth daily for 14 days. 06/28/22 07/12/22  [name], MD  meloxicam (MOBIC) 7.5 MG tablet TAKE 1 TABLET(7.5 MG) BY MOUTH DAILY 04/26/22   [name], NP  nicotine (NICODERM CQ - DOSED IN MG/24 HOURS) 21 mg/24hr patch Place 1 patch (21 mg total) onto the skin daily. 06/13/22 08/12/22  [name], MD  potassium chloride SA (KLOR-CON M) 20 MEQ tablet Take 1 tablet (20 mEq total) by mouth daily. 07/10/22   Milford, [name], [name]  sacubitril-valsartan (ENTRESTO) 49-51 MG Take 1 tablet by mouth 2 (two) times daily. 03/21/22   [name], [name]  spironolactone (ALDACTONE) 25 MG tablet Take 0.5 tablets (12.5 mg total) by mouth daily. 04/04/22   [name], MD  torsemide (DEMADEX) 20 MG tablet Take 1 tablet (20 mg total) by mouth daily. 07/10/22   Milford,  [name], [name]  triamcinolone ointment (KENALOG) 0.5 % APPLY TOPICALLY TO THE AFFECTED AREA TWICE DAILY 04/02/22   [name], NP      Allergies    Shellfish allergy and Peanut-containing drug products    Review of Systems   Review of Systems  Physical Exam Updated Vital Signs BP (!) 127/93 (BP Location: Left Arm)   Pulse 91   Temp 98.1 F (36.7 C) (Oral)   Resp 18   SpO2 96%  Physical Exam Vitals and nursing note reviewed.  Constitutional:      Appearance: Normal appearance.  HENT:     Head: Normocephalic and atraumatic.     Right Ear: External ear normal.     Left Ear: External ear normal.     Nose: Nose normal.     Mouth/Throat:     Mouth:  Mucous membranes are dry.     Pharynx: Oropharynx is clear.  Eyes:     Extraocular Movements: Extraocular movements intact.     Pupils: Pupils are equal, round, and reactive to light.  Cardiovascular:     Rate and Rhythm: Normal rate and regular rhythm.     Pulses: Normal pulses.     Heart sounds: Normal heart sounds.  Pulmonary:     Effort: Pulmonary effort is normal.     Breath sounds: Normal breath sounds.  Abdominal:     General: Bowel sounds are normal.     Palpations: Abdomen is soft.  Musculoskeletal:        General: No swelling or tenderness.     Cervical back: Normal range of motion.  Skin:    General: Skin is warm and dry.     Capillary Refill: Capillary refill takes less than 2 seconds.  Neurological:     Mental Status: He is alert.     Comments: Residual right-sided deficits with right arm contractured Patient is awake and alert and oriented x 4  Psychiatric:        Mood and Affect: Mood normal.        Behavior: Behavior normal.     ED Results / Procedures / Treatments   Labs (all labs ordered are listed, but only abnormal results are displayed) Labs Reviewed  RESP PANEL BY RT-PCR (RSV, FLU A&B, COVID)  RVPGX2 - Abnormal; Notable for the following components:      Result Value   Influenza A by PCR POSITIVE (*)    All other components within normal limits  BASIC METABOLIC PANEL - Abnormal; Notable for the following components:   Glucose, Bld 103 (*)    BUN 23 (*)    Creatinine, Ser 1.91 (*)    Calcium 8.8 (*)    GFR, Estimated 42 (*)    All other components within normal limits  URINALYSIS, ROUTINE W REFLEX MICROSCOPIC - Abnormal; Notable for the following components:   APPearance HAZY (*)    Glucose, UA >=500 (*)    Protein, ur 100 (*)    Bacteria, UA RARE (*)    All other components within normal limits  CBC  DIGOXIN LEVEL  CBG MONITORING, ED    EKG EKG Interpretation  Date/Time:  Tuesday July 17 2022 16:19:39 EST Ventricular Rate:  91 PR  Interval:  126 QRS Duration: 92 QT Interval:  440 QTC Calculation: 541 R Axis:   33 Text Interpretation: Normal sinus rhythm Right atrial enlargement Minimal voltage criteria for LVH, may be normal variant ( Cornell product ) nonspecific st changes laeral leads Prolonged QT  Abnormal ECG When compared with ECG of 10-Jul-2022 11:15, PREVIOUS ECG IS PRESENT Confirmed by Pattricia Boss [phone]) on 07/18/2022 8:10:41 AM  Radiology DG Chest 2 View  Result Date: 07/18/2022 CLINICAL DATA:  Cough congestion and fever EXAM: CHEST - 2 VIEW COMPARISON:  Chest 02/05/2022 FINDINGS: Heart size upper normal. Pulmonary vascularity normal. Negative for heart failure. Lungs are clear without infiltrate or effusion IMPRESSION: No active cardiopulmonary disease. Electronically Signed   By: Franchot Gallo M.D.   On: 07/18/2022 08:45    Procedures Procedures    Medications Ordered in ED Medications  lactated ringers bolus 250 mL (0 mLs Intravenous Stopped 07/18/22 1014)    ED Course/ Medical Decision Making/ A&P                           Medical Decision Making 51 year old male presents today complaining of generalized weakness, congestion, diarrhea, coughing. He is hemodynamically stable here.  Patient had testing done and is flu positive Patient advised regarding medication use Patient advised regarding return precautions need for follow-up and voices understanding.  Amount and/or Complexity of Data Reviewed External Data Reviewed: notes.    Details: Reviewed last discharge summary Labs: ordered. Decision-making details documented in ED Course. Radiology: ordered and independent interpretation performed. Decision-making details documented in ED Course.           Final Clinical Impression(s) / ED Diagnoses Final diagnoses:  Weakness  Flu    Rx / DC Orders ED Discharge Orders     None         Pattricia Boss, MD 07/18/22 1128

## 2022-07-20 ENCOUNTER — Other Ambulatory Visit (HOSPITAL_COMMUNITY): Payer: Medicaid Other

## 2022-07-21 ENCOUNTER — Emergency Department (HOSPITAL_COMMUNITY): Payer: Medicaid Other

## 2022-07-21 ENCOUNTER — Emergency Department (HOSPITAL_COMMUNITY)
Admission: EM | Admit: 2022-07-21 | Discharge: 2022-07-22 | Disposition: A | Discharge status: Home / Self Care | Payer: Medicaid Other | Attending: Emergency Medicine | Admitting: Emergency Medicine

## 2022-07-21 DIAGNOSIS — J45909 Unspecified asthma, uncomplicated: Secondary | ICD-10-CM | POA: Insufficient documentation

## 2022-07-21 DIAGNOSIS — J101 Influenza due to other identified influenza virus with other respiratory manifestations: Secondary | ICD-10-CM | POA: Insufficient documentation

## 2022-07-21 DIAGNOSIS — Z7901 Long term (current) use of anticoagulants: Secondary | ICD-10-CM | POA: Insufficient documentation

## 2022-07-21 DIAGNOSIS — J111 Influenza due to unidentified influenza virus with other respiratory manifestations: Secondary | ICD-10-CM

## 2022-07-21 DIAGNOSIS — Z91010 Allergy to peanuts: Secondary | ICD-10-CM | POA: Insufficient documentation

## 2022-07-21 DIAGNOSIS — R079 Chest pain, unspecified: Secondary | ICD-10-CM | POA: Diagnosis not present

## 2022-07-21 DIAGNOSIS — I509 Heart failure, unspecified: Secondary | ICD-10-CM | POA: Insufficient documentation

## 2022-07-21 DIAGNOSIS — M791 Myalgia, unspecified site: Secondary | ICD-10-CM | POA: Diagnosis present

## 2022-07-21 DIAGNOSIS — R0789 Other chest pain: Secondary | ICD-10-CM | POA: Diagnosis not present

## 2022-07-21 NOTE — ED Provider Triage Note (Cosign Needed)
Emergency Medicine Provider Triage Evaluation Note  Craig Schmidt , a 51 y.o. male  was evaluated in triage.  Pt complains of persistent fatigue and malaise. Diagnosed with the flu 3 days ago. Has been using tylenol without relief. Notes intermittent CP and SOB; asymptomatic with respect to this right now. Came back to the ED because he continues to feel poorly.  Review of Systems  Positive: As above Negative: As above  Physical Exam  BP (!) 150/102 (BP Location: Left Arm)   Pulse 87   Temp 98.1 F (36.7 C) (Oral)   Resp 18   Ht '5\' 7"'$  (1.702 m)   Wt 76.2 kg   SpO2 100%   BMI 26.31 kg/m  Gen:   Awake, chronically ill appearing. Nontoxic.    Resp:  Normal effort  MSK:   Moves extremities without difficulty  Other:  Erythema of the right TM with obscured cone of light.   Medical Decision Making  Medically screening exam initiated at 11:14 PM.  Appropriate orders placed.  Craig Schmidt was informed that the remainder of the evaluation will be completed by another provider, this initial triage assessment does not replace that evaluation, and the importance of remaining in the ED until their evaluation is complete.  Influenza, sequelae - labs and CXR ordered for trending.   Antonietta Breach, [name] 07/21/22 2317

## 2022-07-21 NOTE — ED Triage Notes (Signed)
Pt to ED c/o weakness, diarrhea, cold symptoms, intermittent chest pain  x 5 days, reports evaluated 5 days ago for the same and was dx with flu

## 2022-07-22 ENCOUNTER — Emergency Department (HOSPITAL_COMMUNITY): Payer: Medicaid Other

## 2022-07-22 DIAGNOSIS — R079 Chest pain, unspecified: Secondary | ICD-10-CM | POA: Diagnosis not present

## 2022-07-22 LAB — CBC WITH DIFFERENTIAL/PLATELET
Abs Immature Granulocytes: 0.01 10*3/uL (ref 0.00–0.07)
Basophils Absolute: 0 10*3/uL (ref 0.0–0.1)
Basophils Relative: 0 %
Eosinophils Absolute: 0.1 10*3/uL (ref 0.0–0.5)
Eosinophils Relative: 2 %
HCT: 41.3 % (ref 39.0–52.0)
Hemoglobin: 14.2 g/dL (ref 13.0–17.0)
Immature Granulocytes: 0 %
Lymphocytes Relative: 21 %
Lymphs Abs: 1.5 10*3/uL (ref 0.7–4.0)
MCH: 32.8 pg (ref 26.0–34.0)
MCHC: 34.4 g/dL (ref 30.0–36.0)
MCV: 95.4 fL (ref 80.0–100.0)
Monocytes Absolute: 0.7 10*3/uL (ref 0.1–1.0)
Monocytes Relative: 10 %
Neutro Abs: 4.9 10*3/uL (ref 1.7–7.7)
Neutrophils Relative %: 67 %
Platelets: 296 10*3/uL (ref 150–400)
RBC: 4.33 MIL/uL (ref 4.22–5.81)
RDW: 12.8 % (ref 11.5–15.5)
WBC: 7.3 10*3/uL (ref 4.0–10.5)
nRBC: 0 % (ref 0.0–0.2)

## 2022-07-22 LAB — BASIC METABOLIC PANEL
Anion gap: 10 (ref 5–15)
BUN: 25 mg/dL — ABNORMAL HIGH (ref 6–20)
CO2: 24 mmol/L (ref 22–32)
Calcium: 9.5 mg/dL (ref 8.9–10.3)
Chloride: 106 mmol/L (ref 98–111)
Creatinine, Ser: 1.57 mg/dL — ABNORMAL HIGH (ref 0.61–1.24)
GFR, Estimated: 54 mL/min — ABNORMAL LOW (ref >60)
Glucose, Bld: 101 mg/dL — ABNORMAL HIGH (ref 70–99)
Potassium: 5.1 mmol/L (ref 3.5–5.1)
Sodium: 140 mmol/L (ref 135–145)

## 2022-07-22 LAB — TROPONIN I (HIGH SENSITIVITY): Troponin I (High Sensitivity): 23 ng/L — ABNORMAL HIGH (ref <18)

## 2022-07-22 LAB — DIGOXIN LEVEL: Digoxin Level: <0.2 ng/mL — ABNORMAL LOW (ref 0.8–2.0)

## 2022-07-22 LAB — BRAIN NATRIURETIC PEPTIDE: B Natriuretic Peptide: 466 pg/mL — ABNORMAL HIGH (ref 0.0–100.0)

## 2022-07-22 MED ORDER — ALBUTEROL SULFATE HFA 108 (90 BASE) MCG/ACT IN AERS
2.0000 | INHALATION_SPRAY | Freq: Once | RESPIRATORY_TRACT | Status: AC
  Administered 2022-07-22: 2 via RESPIRATORY_TRACT
  Filled 2022-07-22: qty 6.7

## 2022-07-22 NOTE — Discharge Instructions (Signed)
Talk to your doctor about your pill burden.  Digoxin is an important medication that you should be taking.

## 2022-07-22 NOTE — ED Notes (Signed)
Discharge instructions reviewed with patient, patient denies any questions or concerns at this time. Patient provided with meal and ambulatory out of ED.

## 2022-07-22 NOTE — ED Notes (Signed)
Repeat CBC collected and sent to lab.

## 2022-07-22 NOTE — ED Provider Notes (Signed)
[facility] EMERGENCY DEPARTMENT Provider Note   CSN: [id number] Arrival date & time: 07/21/22  2219     History  Chief Complaint  Patient presents with   Influenza   Chest Pain    Craig Schmidt is a 51 y.o. male.  51 year old male with significant past medical history including CHF, asthma, MI, CVA seen in this ER on 07/17/2022, diagnosed with flu presents with report of continuing to feel poorly.  States that he is very tired, continues to have generalized body aches.  At time of exam, denies chest pain and shortness of breath which she had reported in triage on arrival several hours ago.       Home Medications Prior to Admission medications   Medication Sig Start Date End Date Taking? Authorizing Provider  apixaban (ELIQUIS) 5 MG TABS tablet Take 1 tablet (5 mg total) by mouth 2 (two) times daily. 12/27/21   Clegg, [name], NP  atorvastatin (LIPITOR) 40 MG tablet TAKE 1 TABLET(40 MG) BY MOUTH DAILY 04/23/22   [name], MD  carvedilol (COREG) 6.25 MG tablet Take 1 tablet (6.25 mg total) by mouth 2 (two) times daily. 04/04/22   [name], MD  clopidogrel (PLAVIX) 75 MG tablet TAKE ONE TABLET BY MOUTH ONCE DAILY 07/10/22   Clegg, [name], NP  digoxin (LANOXIN) 0.125 MG tablet Take 1 tablet (0.125 mg total) by mouth daily. 12/28/21   Clegg, [name], NP  empagliflozin (JARDIANCE) 10 MG TABS tablet Take 1 tablet (10 mg total) by mouth daily. 12/28/21   Clegg, [name], NP  famotidine (PEPCID) 20 MG tablet Take 1 tablet (20 mg total) by mouth daily. 12/28/21   Clegg, [name], NP  Glycerin-Hypromellose-PEG 400 0.2-0.2-1 % SOLN Place 1 drop into both eyes as needed. 12/27/21   Clegg, [name], NP  isosorbide-hydrALAZINE (BIDIL) 20-37.5 MG tablet Take 1 & 1/2 tablets by mouth 3 times daily. 07/11/22   Milford, [name], [name]  lamoTRIgine (LAMICTAL) 25 MG tablet Take 1 tablet (25 mg total) by mouth daily for 14 days. Then increase to 50 mg daily. 06/13/22 07/10/22  [name], MD  lamoTRIgine (LAMICTAL) 25 MG tablet Take 2 tablets (50 mg total) by mouth daily for 14 days. 06/28/22 07/12/22  [name], MD  meloxicam (MOBIC) 7.5 MG tablet TAKE 1 TABLET(7.5 MG) BY MOUTH DAILY 04/26/22   [name], NP  nicotine (NICODERM CQ - DOSED IN MG/24 HOURS) 21 mg/24hr patch Place 1 patch (21 mg total) onto the skin daily. 06/13/22 08/12/22  [name], MD  potassium chloride SA (KLOR-CON M) 20 MEQ tablet Take 1 tablet (20 mEq total) by mouth daily. 07/10/22   Milford, [name], [name]  sacubitril-valsartan (ENTRESTO) 49-51 MG Take 1 tablet by mouth 2 (two) times daily. 03/21/22   [name], [name]  spironolactone (ALDACTONE) 25 MG tablet Take 0.5 tablets (12.5 mg total) by mouth daily. 04/04/22   [name], MD  torsemide (DEMADEX) 20 MG tablet Take 1 tablet (20 mg total) by mouth daily. 07/10/22   [name], [name]  triamcinolone ointment (KENALOG) 0.5 % APPLY TOPICALLY TO THE AFFECTED AREA TWICE DAILY 04/02/22   [name], NP      Allergies    Shellfish allergy and Peanut-containing drug products    Review of Systems   Review of Systems Negative except as per HPI Physical Exam Updated Vital Signs BP (!) 142/86   Pulse 83   Temp  98.2 F (36.8 C)   Resp 20   Ht '5\' 7"'$  (1.702 m)   Wt 76.2 kg   SpO2 97%   BMI 26.31 kg/m  Physical Exam Vitals and nursing note reviewed.  Constitutional:      General: He is not in acute distress.    Appearance: He is well-developed. He is not diaphoretic.  HENT:     Head: Normocephalic and atraumatic.  Cardiovascular:     Rate and Rhythm: Normal rate and regular rhythm.  Pulmonary:     Effort: Pulmonary effort is normal.     Breath sounds: Wheezing present.  Chest:     Chest wall: No tenderness.  Musculoskeletal:     Cervical back: Neck supple.     Right lower leg: No tenderness. No edema.     Left lower leg: No tenderness. No edema.  Skin:    General: Skin is warm and dry.   Neurological:     Mental Status: He is alert and oriented to person, place, and time.  Psychiatric:        Behavior: Behavior normal.     ED Results / Procedures / Treatments   Labs (all labs ordered are listed, but only abnormal results are displayed) Labs Reviewed  BASIC METABOLIC PANEL - Abnormal; Notable for the following components:      Result Value   Glucose, Bld 101 (*)    BUN 25 (*)    Creatinine, Ser 1.57 (*)    GFR, Estimated 54 (*)    All other components within normal limits  BRAIN NATRIURETIC PEPTIDE - Abnormal; Notable for the following components:   B Natriuretic Peptide 466.0 (*)    All other components within normal limits  DIGOXIN LEVEL - Abnormal; Notable for the following components:   Digoxin Level <0.2 (*)    All other components within normal limits  TROPONIN I (HIGH SENSITIVITY) - Abnormal; Notable for the following components:   Troponin I (High Sensitivity) 23 (*)    All other components within normal limits  CBC WITH DIFFERENTIAL/PLATELET  CBC WITH DIFFERENTIAL/PLATELET    EKG EKG Interpretation  Date/Time:  Saturday July 21 2022 22:37:09 EST Ventricular Rate:  88 PR Interval:  130 QRS Duration: 94 QT Interval:  398 QTC Calculation: 481 R Axis:   43 Text Interpretation: Normal sinus rhythm Right atrial enlargement ST & T wave abnormality, consider inferolateral ischemia Prolonged QT Abnormal ECG When compared with ECG of 17-Jul-2022 16:19, PREVIOUS ECG IS PRESENT Confirmed by Dene Gentry [id number]) on 07/22/2022 9:08:52 AM  Radiology DG Chest 2 View  Result Date: 07/22/2022 CLINICAL DATA:  Chest pain for few days EXAM: CHEST - 2 VIEW COMPARISON:  07/18/2022 FINDINGS: The heart size and mediastinal contours are within normal limits. Both lungs are clear. The visualized skeletal structures are unremarkable. IMPRESSION: No active cardiopulmonary disease. Electronically Signed   By: Inez Catalina M.D.   On: 07/22/2022 01:29     Procedures Procedures    Medications Ordered in ED Medications  albuterol (VENTOLIN HFA) 108 (90 Base) MCG/ACT inhaler 2 puff (2 puffs Inhalation Given 07/22/22 0756)    ED Course/ Medical Decision Making/ A&P                           Medical Decision Making  This patient presents to the ED for concern of fatigue, this involves an extensive number of treatment options, and is a complaint that carries with it a high risk  of complications and morbidity.  The differential diagnosis includes viral illness, pneumonia, CHF, ACS   Co morbidities that complicate the patient evaluation  As above   Additional history obtained:  Additional history obtained from friend at bedside who contributes to history as above. External records from outside source obtained and reviewed including labs on file from prior visits for comparison   Lab Tests:  I Ordered, and personally interpreted labs.  The pertinent results include: Digoxin subtherapeutic, patient states he is not taking this medication.  Troponin 23, similar to prior on file.  BMP with creatinine of 1.57, improved compared to prior on file.  BNP 466, improved to prior.  CBC within normal limits.   Imaging Studies ordered:  I ordered imaging studies including chest x-ray I independently visualized and interpreted imaging which showed no acute disease I agree with the radiologist interpretation   Cardiac Monitoring: / EKG:  The patient was maintained on a cardiac monitor.  I personally viewed and interpreted the cardiac monitored which showed an underlying rhythm of: Normal sinus rhythm, rate 88   Problem List / ED Course / Critical interventions / Medication management  51 year old male presents with complaint of ongoing fatigue after being diagnosed with the flu 6 days ago.  Labs are reviewed, appear to be improving compared to prior on file.  Chest x-ray does not suggest volume overload, there is no lower extremity edema.   He does have some wheezing on exam, states that he has asthma and has not been using an inhaler.  Patient is provided with an inhaler in the ER today with improvement in his wheezing, discharged with same.  Recommend recheck with his care team to discuss pill burden and other concerns. I ordered medication including albuterol for wheezing Reevaluation of the patient after these medicines showed that the patient improved I have reviewed the patients home medicines and have made adjustments as needed   Social Determinants of Health:  Homeless, has a specialty care team for follow-up   Test / Admission - Considered:  After comprehensive evaluation emergency room and prolonged monitoring.,  Patient is felt stable for discharge to follow-up with his care team.  Discussed his subtherapeutic digoxin, patient states that he takes too many pills and has chosen not to take this 1.  He is advised this medication is important for his heart condition and recommend that he continue to take it.  Advised to discuss his pill burden with his care team.         Final Clinical Impression(s) / ED Diagnoses Final diagnoses:  Influenza    Rx / DC Orders ED Discharge Orders     None         Tacy Learn, [name] 07/22/22 0911    Valarie Merino, MD 07/23/22 [phone]

## 2022-07-24 DIAGNOSIS — R32 Unspecified urinary incontinence: Secondary | ICD-10-CM | POA: Diagnosis not present

## 2022-07-25 ENCOUNTER — Other Ambulatory Visit: Payer: Self-pay

## 2022-07-25 ENCOUNTER — Emergency Department (HOSPITAL_COMMUNITY): Payer: Medicaid Other

## 2022-07-25 ENCOUNTER — Encounter (HOSPITAL_COMMUNITY): Payer: Self-pay | Admitting: Emergency Medicine

## 2022-07-25 ENCOUNTER — Emergency Department (HOSPITAL_COMMUNITY)
Admission: EM | Admit: 2022-07-25 | Discharge: 2022-07-26 | Disposition: A | Discharge status: Home / Self Care | Payer: Medicaid Other | Attending: Emergency Medicine | Admitting: Emergency Medicine

## 2022-07-25 DIAGNOSIS — R062 Wheezing: Secondary | ICD-10-CM

## 2022-07-25 DIAGNOSIS — J111 Influenza due to unidentified influenza virus with other respiratory manifestations: Secondary | ICD-10-CM | POA: Insufficient documentation

## 2022-07-25 DIAGNOSIS — Z7901 Long term (current) use of anticoagulants: Secondary | ICD-10-CM | POA: Insufficient documentation

## 2022-07-25 DIAGNOSIS — R079 Chest pain, unspecified: Secondary | ICD-10-CM | POA: Diagnosis not present

## 2022-07-25 DIAGNOSIS — Z91010 Allergy to peanuts: Secondary | ICD-10-CM | POA: Insufficient documentation

## 2022-07-25 DIAGNOSIS — R531 Weakness: Secondary | ICD-10-CM | POA: Diagnosis present

## 2022-07-25 DIAGNOSIS — R0789 Other chest pain: Secondary | ICD-10-CM | POA: Diagnosis not present

## 2022-07-25 DIAGNOSIS — J101 Influenza due to other identified influenza virus with other respiratory manifestations: Secondary | ICD-10-CM | POA: Diagnosis not present

## 2022-07-25 LAB — CBC WITH DIFFERENTIAL/PLATELET
Abs Immature Granulocytes: 0.03 10*3/uL (ref 0.00–0.07)
Basophils Absolute: 0 10*3/uL (ref 0.0–0.1)
Basophils Relative: 0 %
Eosinophils Absolute: 0.1 10*3/uL (ref 0.0–0.5)
Eosinophils Relative: 1 %
HCT: 43.8 % (ref 39.0–52.0)
Hemoglobin: 14 g/dL (ref 13.0–17.0)
Immature Granulocytes: 0 %
Lymphocytes Relative: 19 %
Lymphs Abs: 1.6 10*3/uL (ref 0.7–4.0)
MCH: 32 pg (ref 26.0–34.0)
MCHC: 32 g/dL (ref 30.0–36.0)
MCV: 100 fL (ref 80.0–100.0)
Monocytes Absolute: 0.6 10*3/uL (ref 0.1–1.0)
Monocytes Relative: 7 %
Neutro Abs: 5.9 10*3/uL (ref 1.7–7.7)
Neutrophils Relative %: 73 %
Platelets: 390 10*3/uL (ref 150–400)
RBC: 4.38 MIL/uL (ref 4.22–5.81)
RDW: 13 % (ref 11.5–15.5)
WBC: 8.3 10*3/uL (ref 4.0–10.5)
nRBC: 0 % (ref 0.0–0.2)

## 2022-07-25 LAB — TROPONIN I (HIGH SENSITIVITY): Troponin I (High Sensitivity): 17 ng/L (ref <18)

## 2022-07-25 LAB — MAGNESIUM: Magnesium: 1.9 mg/dL (ref 1.7–2.4)

## 2022-07-25 LAB — COMPREHENSIVE METABOLIC PANEL
ALT: 12 U/L (ref 0–44)
AST: 33 U/L (ref 15–41)
Albumin: 3.3 g/dL — ABNORMAL LOW (ref 3.5–5.0)
Alkaline Phosphatase: 69 U/L (ref 38–126)
Anion gap: 7 (ref 5–15)
BUN: 24 mg/dL — ABNORMAL HIGH (ref 6–20)
CO2: 24 mmol/L (ref 22–32)
Calcium: 9.1 mg/dL (ref 8.9–10.3)
Chloride: 105 mmol/L (ref 98–111)
Creatinine, Ser: 1.66 mg/dL — ABNORMAL HIGH (ref 0.61–1.24)
GFR, Estimated: 50 mL/min — ABNORMAL LOW (ref >60)
Glucose, Bld: 133 mg/dL — ABNORMAL HIGH (ref 70–99)
Potassium: 4.1 mmol/L (ref 3.5–5.1)
Sodium: 136 mmol/L (ref 135–145)
Total Bilirubin: 0.4 mg/dL (ref 0.3–1.2)
Total Protein: 6.8 g/dL (ref 6.5–8.1)

## 2022-07-25 NOTE — ED Triage Notes (Signed)
Pt c/o fever, diarrhea, and flu like symptoms continued since being diagnosed with the flu last week

## 2022-07-25 NOTE — ED Provider Triage Note (Signed)
Emergency Medicine Provider Triage Evaluation Note  TAGEN MILBY , a 52 y.o. male  was evaluated in triage.  Pt complains of chest pain, weakness and persistent diarrhea 1 week after diagnosis with flu. Hx of MI. CVA in the past.   Review of Systems  Positive: As above Negative: As above  Physical Exam  BP (!) 157/96   Pulse 92   Temp 98.4 F (36.9 C)   Resp 18   SpO2 96%  Gen:   Awake, no distress   Resp:  Normal effort  MSK:   Moves extremities without difficulty  Other:  RRR no m/r/g, lungs CTAB  Medical Decision Making  Medically screening exam initiated at 10:24 PM.  Appropriate orders placed.  Eliott Amparan Polinski was informed that the remainder of the evaluation will be completed by another provider, this initial triage assessment does not replace that evaluation, and the importance of remaining in the ED until their evaluation is complete.  This chart was dictated using voice recognition software, Dragon. Despite the best efforts of this provider to proofread and correct errors, errors may still occur which can change documentation meaning.    Emeline Darling, [name] 07/25/22 2226

## 2022-07-26 ENCOUNTER — Encounter (HOSPITAL_COMMUNITY): Payer: Medicaid Other

## 2022-07-26 ENCOUNTER — Other Ambulatory Visit (HOSPITAL_COMMUNITY): Payer: Self-pay

## 2022-07-26 LAB — TROPONIN I (HIGH SENSITIVITY): Troponin I (High Sensitivity): 20 ng/L — ABNORMAL HIGH (ref <18)

## 2022-07-26 MED ORDER — ALBUTEROL SULFATE HFA 108 (90 BASE) MCG/ACT IN AERS
2.0000 | INHALATION_SPRAY | Freq: Once | RESPIRATORY_TRACT | Status: AC
  Administered 2022-07-26: 2 via RESPIRATORY_TRACT
  Filled 2022-07-26: qty 6.7

## 2022-07-26 MED ORDER — ONDANSETRON HCL 4 MG/2ML IJ SOLN
4.0000 mg | Freq: Once | INTRAMUSCULAR | Status: AC
  Administered 2022-07-26: 4 mg via INTRAVENOUS
  Filled 2022-07-26: qty 2

## 2022-07-26 MED ORDER — PREDNISONE 20 MG PO TABS
20.0000 mg | ORAL_TABLET | Freq: Every day | ORAL | 0 refills | Status: AC
  Filled 2022-07-26: qty 5, 5d supply, fill #0

## 2022-07-26 MED ORDER — SODIUM CHLORIDE 0.9 % IV BOLUS
500.0000 mL | Freq: Once | INTRAVENOUS | Status: AC
  Administered 2022-07-26: 500 mL via INTRAVENOUS

## 2022-07-26 NOTE — ED Provider Notes (Signed)
[facility] EMERGENCY DEPARTMENT Provider Note   CSN: [id number] Arrival date & time: 07/25/22  2047    History  Chief Complaint  Patient presents with   Influenza    Craig Schmidt is a 52 y.o. male with history significant for cocaine abuse, EtOH use, ischemic cardiomyopathy, prior STEMI, homelessness here for evaluation of persistent flulike symptoms.  Patient states he was seen approximately 1 week ago diagnosed influenza.  States he still feels generally bad.  Generalized weakness, cough, congestion, nausea without vomiting.  He had 2 episodes of loose stool without any melena or bright blood per rectum.  No recent antibiotic use.  He has had overall decreased p.o. intake due to lack of appetite.  No fevers that he knows of.  States he is compliant with his home medications. Using his albuterol inhaler, which helps with his wheeze and coughing.  HPI     Home Medications Prior to Admission medications   Medication Sig Start Date End Date Taking? Authorizing Provider  predniSONE (DELTASONE) 20 MG tablet Take 1 tablet (20 mg total) by mouth daily for 5 days. 07/26/22 07/31/22 Yes Addisen Chappelle A, [name]  apixaban (ELIQUIS) 5 MG TABS tablet Take 1 tablet (5 mg total) by mouth 2 (two) times daily. 12/27/21   Clegg, [name], NP  atorvastatin (LIPITOR) 40 MG tablet TAKE 1 TABLET(40 MG) BY MOUTH DAILY 04/23/22   [name], MD  carvedilol (COREG) 6.25 MG tablet Take 1 tablet (6.25 mg total) by mouth 2 (two) times daily. 04/04/22   [name], MD  clopidogrel (PLAVIX) 75 MG tablet TAKE ONE TABLET BY MOUTH ONCE DAILY 07/10/22   Clegg, [name], NP  digoxin (LANOXIN) 0.125 MG tablet Take 1 tablet (0.125 mg total) by mouth daily. 12/28/21   Clegg, [name], NP  empagliflozin (JARDIANCE) 10 MG TABS tablet Take 1 tablet (10 mg total) by mouth daily. 12/28/21   Clegg, [name], NP  famotidine (PEPCID) 20 MG tablet Take 1 tablet (20 mg total) by mouth daily. 12/28/21   Clegg, [name], NP   Glycerin-Hypromellose-PEG 400 0.2-0.2-1 % SOLN Place 1 drop into both eyes as needed. 12/27/21   Clegg, [name], NP  isosorbide-hydrALAZINE (BIDIL) 20-37.5 MG tablet Take 1 & 1/2 tablets by mouth 3 times daily. 07/11/22   [place], [name], [name]  lamoTRIgine (LAMICTAL) 25 MG tablet Take 1 tablet (25 mg total) by mouth daily for 14 days. Then increase to 50 mg daily. 06/13/22 07/10/22  [name], MD  lamoTRIgine (LAMICTAL) 25 MG tablet Take 2 tablets (50 mg total) by mouth daily for 14 days. 06/28/22 07/12/22  [name], MD  meloxicam (MOBIC) 7.5 MG tablet TAKE 1 TABLET(7.5 MG) BY MOUTH DAILY 04/26/22   [name], NP  nicotine (NICODERM CQ - DOSED IN MG/24 HOURS) 21 mg/24hr patch Place 1 patch (21 mg total) onto the skin daily. 06/13/22 08/12/22  [name], MD  potassium chloride SA (KLOR-CON M) 20 MEQ tablet Take 1 tablet (20 mEq total) by mouth daily. 07/10/22   [place], [name], [name]  sacubitril-valsartan (ENTRESTO) 49-51 MG Take 1 tablet by mouth 2 (two) times daily. 03/21/22   [name], [name]  spironolactone (ALDACTONE) 25 MG tablet Take 0.5 tablets (12.5 mg total) by mouth daily. 04/04/22   [name], MD  torsemide (DEMADEX) 20 MG tablet Take 1 tablet (20 mg total) by mouth daily. 07/10/22   [name], [name]  triamcinolone ointment (KENALOG) 0.5 %  APPLY TOPICALLY TO THE AFFECTED AREA TWICE DAILY 04/02/22   [name], NP      Allergies    Shellfish allergy and Peanut-containing drug products    Review of Systems   Review of Systems  Constitutional:  Positive for activity change, appetite change and fatigue.  HENT:  Positive for congestion and rhinorrhea.   Respiratory:  Positive for cough. Negative for apnea, choking, chest tightness, shortness of breath, wheezing and stridor.   Cardiovascular: Negative.   Gastrointestinal:  Positive for diarrhea (2 episodes) and nausea. Negative for abdominal distention, abdominal pain, anal bleeding, blood in  stool, constipation, rectal pain and vomiting.  Genitourinary: Negative.   Musculoskeletal: Negative.   Skin: Negative.   Neurological:  Positive for weakness (generalized). Negative for dizziness, tremors, seizures, syncope, facial asymmetry, speech difficulty, light-headedness, numbness and headaches.  All other systems reviewed and are negative.   Physical Exam Updated Vital Signs BP (!) 134/92 (BP Location: Left Arm)   Pulse 98   Temp 98.2 F (36.8 C) (Oral)   Resp 16   SpO2 99%  Physical Exam Vitals and nursing note reviewed.  Constitutional:      General: He is not in acute distress.    Appearance: He is well-developed. He is not ill-appearing, toxic-appearing or diaphoretic.  HENT:     Head: Normocephalic and atraumatic.     Nose: Nose normal.  Eyes:     Pupils: Pupils are equal, round, and reactive to light.  Cardiovascular:     Rate and Rhythm: Normal rate and regular rhythm.     Pulses: Normal pulses.     Heart sounds: Normal heart sounds.  Pulmonary:     Effort: Pulmonary effort is normal. No respiratory distress.     Breath sounds: Wheezing present.     Comments: Mild expiratory wheeze Abdominal:     General: Bowel sounds are normal. There is no distension.     Palpations: Abdomen is soft.     Tenderness: There is no abdominal tenderness. There is no right CVA tenderness, left CVA tenderness, guarding or rebound.  Musculoskeletal:        General: No swelling, tenderness, deformity or signs of injury. Normal range of motion.     Cervical back: Normal range of motion and neck supple.     Right lower leg: No edema.     Left lower leg: No edema.  Skin:    General: Skin is warm and dry.     Capillary Refill: Capillary refill takes less than 2 seconds.  Neurological:     General: No focal deficit present.     Mental Status: He is alert and oriented to person, place, and time.    ED Results / Procedures / Treatments   Labs (all labs ordered are listed, but  only abnormal results are displayed) Labs Reviewed  COMPREHENSIVE METABOLIC PANEL - Abnormal; Notable for the following components:      Result Value   Glucose, Bld 133 (*)    BUN 24 (*)    Creatinine, Ser 1.66 (*)    Albumin 3.3 (*)    GFR, Estimated 50 (*)    All other components within normal limits  TROPONIN I (HIGH SENSITIVITY) - Abnormal; Notable for the following components:   Troponin I (High Sensitivity) 20 (*)    All other components within normal limits  CBC WITH DIFFERENTIAL/PLATELET  MAGNESIUM  TROPONIN I (HIGH SENSITIVITY)    EKG EKG Interpretation  Date/Time:  Wednesday July 25 2022  22:40:34 EST Ventricular Rate:  94 PR Interval:  132 QRS Duration: 90 QT Interval:  384 QTC Calculation: 480 R Axis:   70 Text Interpretation: Normal sinus rhythm Right atrial enlargement Nonspecific T wave abnormality Prolonged QT Abnormal ECG When compared with ECG of 21-Jul-2022 22:37, PREVIOUS ECG IS PRESENT No significant change was found Interpretation limited secondary to artifact Confirmed by Ezequiel Essex [phone]) on 07/26/2022 10:13:45 AM  Radiology DG Chest 2 View  Result Date: 07/25/2022 CLINICAL DATA:  Chest pain. EXAM: CHEST - 2 VIEW COMPARISON:  July 21, 2022 FINDINGS: The heart size and mediastinal contours are within normal limits. Both lungs are clear. The visualized skeletal structures are unremarkable. IMPRESSION: No active cardiopulmonary disease. Electronically Signed   By: Virgina Norfolk M.D.   On: 07/25/2022 22:41    Procedures Procedures    Medications Ordered in ED Medications  ondansetron (ZOFRAN) injection 4 mg (4 mg Intravenous Given 07/26/22 1214)  sodium chloride 0.9 % bolus 500 mL (0 mLs Intravenous Stopped 07/26/22 1301)  albuterol (VENTOLIN HFA) 108 (90 Base) MCG/ACT inhaler 2 puff (2 puffs Inhalation Given 07/26/22 1301)    ED Course/ Medical Decision Making/ A&P    52 year old multiple medical problems here for evaluation of feeling  unwell.  Was diagnosed with influenza 1 week ago.  He still continues to feel unwell.  He has had some myalgias, fatigue, cough, rhinorrhea, wheeze, decreased p.o. intake.  2 episodes of loose stool without any blood.  No recent antibiotics.  He is worried there is a medication that could help with his symptoms.  States he is compliant with his home medication.  He is also requesting food. No CP, SOB, abd pain, numbness, unilateral weakness.  Labs and imaging personally viewed and interpreted:  CBC without leukocytosis Metabolic panel creatinine 1.66 Troponin 17--20>>> Similar to baseline Mag 1.9 EKG without ischemic changes Chest x-ray without pneumonia, cardiomegaly, pulm edema, pneumothorax  Patient given Zofran, small fluid bolus and albuterol.  On reassessment patient states his symptoms have improved.  He is tolerating p.o. intake.  Will DC home with symptomatic management.  Do not feel he needs inpatient management at this time.  No evidence of fluid overload to his lower extremities.  He is Wells criteria low risk low suspicion for PE.  Has benign abdominal exam.  Do not feel he needs CT imaging of his chest or his abdomen.  The patient has been appropriately medically screened and/or stabilized in the ED. I have low suspicion for any other emergent medical condition which would require further screening, evaluation or treatment in the ED or require inpatient management.  Patient is hemodynamically stable and in no acute distress.  Patient able to ambulate in department prior to ED.  Evaluation does not show acute pathology that would require ongoing or additional emergent interventions while in the emergency department or further inpatient treatment.  I have discussed the diagnosis with the patient and answered all questions.  Pain is been managed while in the emergency department and patient has no further complaints prior to discharge.  Patient is comfortable with plan discussed in room  and is stable for discharge at this time.  I have discussed strict return precautions for returning to the emergency department.  Patient was encouraged to follow-up with PCP/specialist refer to at discharge.                            Medical Decision Making Amount and/or Complexity  of Data Reviewed External Data Reviewed: labs, radiology, ECG and notes. Labs: ordered. Decision-making details documented in ED Course. Radiology: ordered and independent interpretation performed. Decision-making details documented in ED Course. ECG/medicine tests: ordered and independent interpretation performed. Decision-making details documented in ED Course.  Risk OTC drugs. Prescription drug management. Parenteral controlled substances. Decision regarding hospitalization. Diagnosis or treatment significantly limited by social determinants of health.          Final Clinical Impression(s) / ED Diagnoses Final diagnoses:  Influenza  Wheeze    Rx / DC Orders ED Discharge Orders          Ordered    predniSONE (DELTASONE) 20 MG tablet  Daily        07/26/22 1358              Eric Nees A, [name] 07/26/22 1404    Wyvonnia Dusky, MD 07/26/22 1658

## 2022-07-26 NOTE — Discharge Instructions (Addendum)
Continue taking your home medications, follow-up outpatient.

## 2022-07-27 ENCOUNTER — Telehealth: Payer: Self-pay | Admitting: *Deleted

## 2022-07-27 ENCOUNTER — Encounter (HOSPITAL_COMMUNITY): Payer: Self-pay | Admitting: Emergency Medicine
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
## 2022-07-27 NOTE — Patient Outreach (Signed)
  Care Coordination [facility] Note Transition Care Management Unsuccessful Follow-up Telephone Call  Date of discharge and from where:  07/26/22 from [facility]  Attempts:  1st Attempt  Reason for unsuccessful TCM follow-up call:  Left voice message   Lurena Joiner RN, BSN [place] RN Care Coordinator

## 2022-08-02 ENCOUNTER — Encounter (HOSPITAL_COMMUNITY): Payer: Self-pay | Admitting: Emergency Medicine

## 2022-08-02 ENCOUNTER — Telehealth (HOSPITAL_COMMUNITY): Payer: Self-pay | Admitting: Licensed Clinical Social Worker
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
## 2022-08-03 ENCOUNTER — Emergency Department (HOSPITAL_COMMUNITY): Payer: Medicaid Other

## 2022-08-03 ENCOUNTER — Emergency Department (HOSPITAL_COMMUNITY)
Admission: EM | Admit: 2022-08-03 | Discharge: 2022-08-03 | Disposition: A | Discharge status: Home / Self Care | Payer: Medicaid Other | Attending: Emergency Medicine | Admitting: Emergency Medicine

## 2022-08-03 ENCOUNTER — Other Ambulatory Visit: Payer: Self-pay

## 2022-08-03 ENCOUNTER — Encounter (HOSPITAL_COMMUNITY): Payer: Self-pay | Admitting: Emergency Medicine

## 2022-08-03 DIAGNOSIS — Z7901 Long term (current) use of anticoagulants: Secondary | ICD-10-CM | POA: Diagnosis not present

## 2022-08-03 DIAGNOSIS — R0789 Other chest pain: Secondary | ICD-10-CM | POA: Diagnosis not present

## 2022-08-03 DIAGNOSIS — Z7902 Long term (current) use of antithrombotics/antiplatelets: Secondary | ICD-10-CM | POA: Diagnosis not present

## 2022-08-03 DIAGNOSIS — I5043 Acute on chronic combined systolic (congestive) and diastolic (congestive) heart failure: Secondary | ICD-10-CM | POA: Diagnosis not present

## 2022-08-03 DIAGNOSIS — J189 Pneumonia, unspecified organism: Secondary | ICD-10-CM | POA: Insufficient documentation

## 2022-08-03 DIAGNOSIS — E119 Type 2 diabetes mellitus without complications: Secondary | ICD-10-CM | POA: Insufficient documentation

## 2022-08-03 DIAGNOSIS — I509 Heart failure, unspecified: Secondary | ICD-10-CM | POA: Insufficient documentation

## 2022-08-03 DIAGNOSIS — I213 ST elevation (STEMI) myocardial infarction of unspecified site: Secondary | ICD-10-CM | POA: Diagnosis not present

## 2022-08-03 DIAGNOSIS — I10 Essential (primary) hypertension: Secondary | ICD-10-CM | POA: Diagnosis not present

## 2022-08-03 DIAGNOSIS — Z91010 Allergy to peanuts: Secondary | ICD-10-CM | POA: Diagnosis not present

## 2022-08-03 DIAGNOSIS — R072 Precordial pain: Secondary | ICD-10-CM

## 2022-08-03 DIAGNOSIS — R000 Tachycardia, unspecified: Secondary | ICD-10-CM | POA: Diagnosis not present

## 2022-08-03 DIAGNOSIS — I491 Atrial premature depolarization: Secondary | ICD-10-CM | POA: Diagnosis not present

## 2022-08-03 DIAGNOSIS — J181 Lobar pneumonia, unspecified organism: Secondary | ICD-10-CM | POA: Diagnosis not present

## 2022-08-03 DIAGNOSIS — R079 Chest pain, unspecified: Secondary | ICD-10-CM | POA: Diagnosis not present

## 2022-08-03 LAB — BASIC METABOLIC PANEL
Anion gap: 11 (ref 5–15)
BUN: 14 mg/dL (ref 6–20)
CO2: 21 mmol/L — ABNORMAL LOW (ref 22–32)
Calcium: 9 mg/dL (ref 8.9–10.3)
Chloride: 103 mmol/L (ref 98–111)
Creatinine, Ser: 1.2 mg/dL (ref 0.61–1.24)
GFR, Estimated: >60 mL/min (ref >60)
Glucose, Bld: 78 mg/dL (ref 70–99)
Potassium: 4.3 mmol/L (ref 3.5–5.1)
Sodium: 135 mmol/L (ref 135–145)

## 2022-08-03 LAB — CBC
HCT: 44.1 % (ref 39.0–52.0)
Hemoglobin: 14.6 g/dL (ref 13.0–17.0)
MCH: 32 pg (ref 26.0–34.0)
MCHC: 33.1 g/dL (ref 30.0–36.0)
MCV: 96.7 fL (ref 80.0–100.0)
Platelets: 277 10*3/uL (ref 150–400)
RBC: 4.56 MIL/uL (ref 4.22–5.81)
RDW: 13.2 % (ref 11.5–15.5)
WBC: 10.9 10*3/uL — ABNORMAL HIGH (ref 4.0–10.5)
nRBC: 0 % (ref 0.0–0.2)

## 2022-08-03 LAB — TROPONIN I (HIGH SENSITIVITY)
Troponin I (High Sensitivity): 28 ng/L — ABNORMAL HIGH (ref <18)
Troponin I (High Sensitivity): 27 ng/L — ABNORMAL HIGH (ref <18)

## 2022-08-03 MED ORDER — AMOXICILLIN 500 MG PO CAPS
1000.0000 mg | ORAL_CAPSULE | Freq: Once | ORAL | Status: AC
  Administered 2022-08-03: 1000 mg via ORAL
  Filled 2022-08-03: qty 2

## 2022-08-03 MED ORDER — AZITHROMYCIN 250 MG PO TABS: 250.0000 mg | ORAL_TABLET | Freq: Every day | ORAL | 0 refills | Status: DC

## 2022-08-03 MED ORDER — AMOXICILLIN 500 MG PO CAPS: 1000.0000 mg | ORAL_CAPSULE | Freq: Three times a day (TID) | ORAL | 0 refills | Status: AC

## 2022-08-03 MED ORDER — AZITHROMYCIN 250 MG PO TABS
500.0000 mg | ORAL_TABLET | Freq: Once | ORAL | Status: AC
  Administered 2022-08-03: 500 mg via ORAL
  Filled 2022-08-03: qty 2

## 2022-08-03 NOTE — Discharge Instructions (Signed)
Please read and follow all provided instructions.  Your diagnoses today include:  1. Community acquired pneumonia of right upper lobe of lung   2. Precordial pain     Tests performed today include: An EKG of your heart A chest x-ray: Suggests right upper lobe pneumonia Cardiac enzymes - a blood test for heart muscle damage, did not show signs of stress on the heart Blood counts and electrolytes Vital signs. See below for your results today.   Medications prescribed:  Amoxicillin - antibiotic  You have been prescribed an antibiotic medicine: take the entire course of medicine even if you are feeling better. Stopping early can cause the antibiotic not to work.  Azithromycin - antibiotic for respiratory infection  You have been prescribed an antibiotic medicine: take the entire course of medicine even if you are feeling better. Stopping early can cause the antibiotic not to work.  Take any prescribed medications only as directed.  Follow-up instructions: Please follow-up with your primary care provider as soon as you can for further evaluation of your symptoms.   Return instructions:  SEEK IMMEDIATE MEDICAL ATTENTION IF: You have severe chest pain, especially if the pain is crushing or pressure-like and spreads to the arms, back, neck, or jaw, or if you have sweating, nausea or vomiting, or trouble with breathing. THIS IS AN EMERGENCY. Do not wait to see if the pain will go away. Get medical help at once. Call 911. DO NOT drive yourself to the hospital.  Your chest pain gets worse and does not go away after a few minutes of rest.  You have an attack of chest pain lasting longer than what you usually experience.  You have significant dizziness, if you pass out, or have trouble walking.  You have chest pain not typical of your usual pain for which you originally saw your caregiver.  You have any other emergent concerns regarding your health.  Additional Information: Chest pain comes  from many different causes. Your caregiver has diagnosed you as having chest pain that is not specific for one problem, but does not require admission.  You are at low risk for an acute heart condition or other serious illness.   Your vital signs today were: BP (!) 160/110   Pulse (!) 102   Temp 98.1 F (36.7 C) (Oral)   Resp 20   SpO2 100%  If your blood pressure (BP) was elevated above 135/85 this visit, please have this repeated by your doctor within one month. --------------

## 2022-08-03 NOTE — ED Provider Notes (Signed)
[place] EMERGENCY DEPARTMENT Provider Note   CSN: [id number] Arrival date & time: 08/03/22  3785     History  Chief Complaint  Patient presents with   Chest Pain    Craig Schmidt is a 52 y.o. male.  Patient presents emergency department today for evaluation of chest pain and shortness of breath.  Patient has a history of heart failure, stroke, diabetes, homelessness.  He is on anticoagulation.  Patient reports awaking from sleep today with shortness of breath and "heart pain".  He points to his right chest and states that he had to hold the area.  He had associated shortness of breath.  Tightness did not radiate.  He states that he was sweating a lot and having difficulty breathing.  No vomiting.  No recent fevers or cough.  EMS was called for transport.  They administered nitroglycerin and aspirin. Patient denies risk factors for pulmonary embolism including: unilateral leg swelling, history of DVT/PE/other blood clots, use of exogenous hormones, recent immobilizations, recent surgery, recent travel (>4hr segment), malignancy, hemoptysis.          Home Medications Prior to Admission medications   Medication Sig Start Date End Date Taking? Authorizing Provider  apixaban (ELIQUIS) 5 MG TABS tablet Take 1 tablet (5 mg total) by mouth 2 (two) times daily. 12/27/21   Clegg, [name], NP  atorvastatin (LIPITOR) 40 MG tablet TAKE 1 TABLET(40 MG) BY MOUTH DAILY 04/23/22   [name], MD  carvedilol (COREG) 6.25 MG tablet Take 1 tablet (6.25 mg total) by mouth 2 (two) times daily. 04/04/22   [name], MD  clopidogrel (PLAVIX) 75 MG tablet TAKE ONE TABLET BY MOUTH ONCE DAILY 07/10/22   Clegg, [name], NP  digoxin (LANOXIN) 0.125 MG tablet Take 1 tablet (0.125 mg total) by mouth daily. 12/28/21   Clegg, [name], NP  empagliflozin (JARDIANCE) 10 MG TABS tablet Take 1 tablet (10 mg total) by mouth daily. 12/28/21   Clegg, [name], NP  famotidine (PEPCID) 20 MG tablet  Take 1 tablet (20 mg total) by mouth daily. 12/28/21   Clegg, [name], NP  Glycerin-Hypromellose-PEG 400 0.2-0.2-1 % SOLN Place 1 drop into both eyes as needed. 12/27/21   Clegg, [name], NP  isosorbide-hydrALAZINE (BIDIL) 20-37.5 MG tablet Take 1 & 1/2 tablets by mouth 3 times daily. 07/11/22   [place], [name], [name]  lamoTRIgine (LAMICTAL) 25 MG tablet Take 1 tablet (25 mg total) by mouth daily for 14 days. Then increase to 50 mg daily. 06/13/22 07/10/22  [name], MD  lamoTRIgine (LAMICTAL) 25 MG tablet Take 2 tablets (50 mg total) by mouth daily for 14 days. 06/28/22 07/12/22  [name], MD  meloxicam (MOBIC) 7.5 MG tablet TAKE 1 TABLET(7.5 MG) BY MOUTH DAILY 04/26/22   [name], NP  nicotine (NICODERM CQ - DOSED IN MG/24 HOURS) 21 mg/24hr patch Place 1 patch (21 mg total) onto the skin daily. 06/13/22 08/12/22  [name], MD  potassium chloride SA (KLOR-CON M) 20 MEQ tablet Take 1 tablet (20 mEq total) by mouth daily. 07/10/22   [place], [name], [name]  sacubitril-valsartan (ENTRESTO) 49-51 MG Take 1 tablet by mouth 2 (two) times daily. 03/21/22   [name], [name]  spironolactone (ALDACTONE) 25 MG tablet Take 0.5 tablets (12.5 mg total) by mouth daily. 04/04/22   [name], MD  torsemide (DEMADEX) 20 MG tablet Take 1 tablet (20 mg total) by mouth daily. 07/10/22  [place], [place], [name]  triamcinolone ointment (KENALOG) 0.5 % APPLY TOPICALLY TO THE AFFECTED AREA TWICE DAILY 04/02/22   [name], NP      Allergies    Shellfish allergy and Peanut-containing drug products    Review of Systems   Review of Systems  Physical Exam Updated Vital Signs BP (!) 159/127   Pulse 96   Temp 98.1 F (36.7 C) (Oral)   Resp 20   SpO2 100%   Physical Exam Vitals and nursing note reviewed.  Constitutional:      Appearance: He is well-developed. He is not diaphoretic.  HENT:     Head: Normocephalic and atraumatic.     Mouth/Throat:     Mouth: Mucous membranes are  not dry.  Eyes:     Conjunctiva/sclera: Conjunctivae normal.  Neck:     Vascular: Normal carotid pulses. No carotid bruit or JVD.     Trachea: Trachea normal. No tracheal deviation.  Cardiovascular:     Rate and Rhythm: Normal rate and regular rhythm.     Pulses: No decreased pulses.          Radial pulses are 2+ on the right side and 2+ on the left side.     Heart sounds: Normal heart sounds, S1 normal and S2 normal. Heart sounds not distant. No murmur heard. Pulmonary:     Effort: Pulmonary effort is normal. No respiratory distress.     Breath sounds: Normal breath sounds. No wheezing.  Chest:     Chest wall: No tenderness.  Abdominal:     General: Bowel sounds are normal.     Palpations: Abdomen is soft.     Tenderness: There is no abdominal tenderness. There is no guarding or rebound.  Musculoskeletal:     Cervical back: Normal range of motion and neck supple. No muscular tenderness.     Right lower leg: No edema.     Left lower leg: No edema.  Skin:    General: Skin is warm and dry.     Coloration: Skin is not pale.     Comments: Tinea versicolor noted neck and upper chest  Neurological:     Mental Status: He is alert. Mental status is at baseline.  Psychiatric:        Mood and Affect: Mood normal.     ED Results / Procedures / Treatments   Labs (all labs ordered are listed, but only abnormal results are displayed) Labs Reviewed  BASIC METABOLIC PANEL - Abnormal; Notable for the following components:      Result Value   CO2 21 (*)    All other components within normal limits  CBC - Abnormal; Notable for the following components:   WBC 10.9 (*)    All other components within normal limits  TROPONIN I (HIGH SENSITIVITY) - Abnormal; Notable for the following components:   Troponin I (High Sensitivity) 28 (*)    All other components within normal limits  TROPONIN I (HIGH SENSITIVITY) - Abnormal; Notable for the following components:   Troponin I (High Sensitivity)  27 (*)    All other components within normal limits    EKG EKG Interpretation  Date/Time:  Friday August 03 2022 06:10:56 EST Ventricular Rate:  106 PR Interval:  132 QRS Duration: 90 QT Interval:  386 QTC Calculation: 512 R Axis:   143 Text Interpretation: Undetermined rhythm Right axis deviation Septal infarct , age undetermined T wave abnormality, consider lateral ischemia Abnormal ECG frequent pvc Confirmed by Tyrone Nine,  Linna Hoff [id number]) on 08/03/2022 6:21:14 AM  Radiology DG Chest 2 View  Result Date: 08/03/2022 CLINICAL DATA:  52 year old male with chest pain.  Smoker. EXAM: CHEST - 2 VIEW COMPARISON:  Chest radiographs 07/25/2022 and earlier. FINDINGS: PA and lateral views at 0626 hours. Cardiac size is at the upper limits of normal. Other mediastinal contours are within normal limits. Visualized tracheal air column is within normal limits. No pneumothorax, pleural effusion or consolidation. Chronic increased interstitial lung markings possibly due to smoking. But there is subtle increased streaky peribronchial opacity in the right upper lung. No acute osseous abnormality identified. Negative visible bowel gas. IMPRESSION: Subtle increased peribronchial opacity in the right upper lobe. Consider mild or developing infection. No pleural effusion and otherwise stable chest. Electronically Signed   By: Genevie Ann M.D.   On: 08/03/2022 06:45    Procedures Procedures    Medications Ordered in ED Medications  amoxicillin (AMOXIL) capsule 1,000 mg (has no administration in time range)  azithromycin (ZITHROMAX) tablet 500 mg (has no administration in time range)    ED Course/ Medical Decision Making/ A&P    Patient seen and examined. History obtained directly from patient. Work-up including labs, imaging, EKG ordered in triage, if performed, were reviewed.    Labs/EKG: Independently reviewed and interpreted.  This included: CBC with mildly increased white blood cell count at 10.9 otherwise  unremarkable; BMP unremarkable; troponin mildly elevated at 28, he does have a history of mild elevations in the past with his chronic illness, pending second troponin.  EKG reviewed and interpreted as above.  Imaging: Independently visualized and interpreted.  This included: Chest x-ray with subtle right upper lobe streaky opacity, no sign of pneumothorax.  Medications/Fluids: None ordered  Most recent vital signs reviewed and are as follows: BP (!) 159/127   Pulse 96   Temp 98.1 F (36.7 C) (Oral)   Resp 20   SpO2 100%   Initial impression: Nonspecific chest pain and patient with risk factors, not hypoxic, not tachycardic or febrile.  Low concern for sepsis.  Chest x-ray with possible pneumonia.  EKG without acute concerning findings.  Awaiting completion of workup.  10:24 AM Reassessment performed. Patient appears comfortable, watching TV.  No active chest pain.  Labs personally reviewed and interpreted including: CBC shows minimally elevated white blood cell count at 10.9, normal hemoglobin; BMP with minimally decreased bicarb at 21 otherwise unremarkable with glucose of 73 normal kidney function; troponin 28 >> 27 consistent with previous values likely related to underlying heart failure.  Imaging personally visualized and interpreted including: Chest x-ray, agree streaky opacity right upper lobe.  Reviewed pertinent lab work and imaging with patient at bedside. Questions answered.   Most current vital signs reviewed and are as follows: BP (!) 160/110   Pulse (!) 102   Temp 98.1 F (36.7 C) (Oral)   Resp 20   SpO2 100%   Plan: Plan for discharge after administration of amoxicillin and azithromycin.  Encourage patient to continue his medications, including Eliquis, follow-up with PCP/cardiologist as planned.                           Medical Decision Making Amount and/or Complexity of Data Reviewed Labs: ordered. Radiology: ordered.  Risk Prescription drug  management.   For this patient's complaint of chest pain, the following emergent conditions were considered on the differential diagnosis: acute coronary syndrome, pulmonary embolism, pneumothorax, myocarditis, pericardial tamponade, aortic dissection, thoracic aortic aneurysm complication,  esophageal perforation.   Other causes were also considered including: gastroesophageal reflux disease, musculoskeletal pain including costochondritis, pneumonia/pleurisy, herpes zoster, pericarditis.  In regards to possibility of ACS, patient has atypical features of pain, non-ischemic and unchanged EKG and negative troponin(s). Heart score was calculated to be 3.   In regards to possibility of PE, symptoms are atypical for PE and risk profile is low, making PE low likelihood.   The patient's vital signs, pertinent lab work and imaging were reviewed and interpreted as discussed in the ED course. Hospitalization was considered for further testing, treatments, or serial exams/observation. However as patient is well-appearing, has a stable exam, and reassuring studies today, I do not feel that they warrant admission at this time. This plan was discussed with the patient who verbalizes agreement and comfort with this plan and seems reliable and able to return to the Emergency Department with worsening or changing symptoms.          Final Clinical Impression(s) / ED Diagnoses Final diagnoses:  Community acquired pneumonia of right upper lobe of lung  Precordial pain    Rx / DC Orders ED Discharge Orders          Ordered    amoxicillin (AMOXIL) 500 MG capsule  3 times daily        08/03/22 1059    azithromycin (ZITHROMAX) 250 MG tablet  Daily        08/03/22 1059              Carlisle Cater, [name] 08/03/22 1102    Valarie Merino, MD 08/03/22 1538

## 2022-08-03 NOTE — ED Triage Notes (Signed)
Per EMS, [place] homeless [place], reports that substernal chest pain woke him up out of sleep. He has hx of CHF and is a participant in the community parametric program. He reports he takes his meds.   EMS gave 2 nitros, pain improved form a 9 to a 6/10 pain.  Give 342 ASA as well.

## 2022-08-08 ENCOUNTER — Encounter: Payer: Self-pay | Admitting: *Deleted

## 2022-08-08 ENCOUNTER — Telehealth (HOSPITAL_COMMUNITY): Payer: Self-pay | Admitting: Emergency Medicine

## 2022-08-08 NOTE — Telephone Encounter (Signed)
Returned call to Craig Schmidt from a message he left me this morning @ 7:00.  He advised he was at the [facility] and "still think I have some virus lingering and wanted me to help get him to the hospital.  I LVM stating that he he felt need to get to the hospital he should call 911.    [name], [place] 08/08/2022

## 2022-08-08 NOTE — Congregational Nurse Program (Signed)
  Dept: [phone]   Congregational Nurse Program Note  Date of Encounter: 08/08/2022  Past Medical History: Past Medical History:  Diagnosis Date   Asthma    Brain tumor [facility])    MI (myocardial infarction) ([place])    Stroke [facility])     Encounter Details:  CNP Questionnaire - 08/08/22 1500       Questionnaire   Ask client: Do you give verbal consent for me to treat you today? Yes    Student Assistance N/A    Location Patient Served  [facility]    Visit Setting with Client Organization    Patient Status Unknown    Insurance Medicaid    Insurance/Financial Assistance Referral N/A    Medication N/A    Medical Provider Yes    Screening Referrals Made N/A    Medical Referrals Made N/A    Medical Appointment Made N/A    Recently w/o PCP, now 1st time PCP visit completed due to CNs referral or appointment made N/A    Food N/A    Transportation Need transportation assistance;Referred to transportation service    Housing/Utilities No permanent housing    Interpersonal Safety N/A    Interventions Advocate/Support;Navigate Healthcare System    Abnormal to Normal Screening Since Last CN Visit N/A    Screenings CN Performed N/A    Sent Client to Lab for: N/A    Did client attend any of the following based off CNs referral or appointments made? N/A    ED Visit Averted N/A    Life-Saving Intervention Made N/A           Client came to nurse's office seeking help with transportation to several doctor's appointments. Assisted client in calling [place]. Set up transportation for Renea Schoonmaker [facility] [facility] appt, Anes Rigel 19[address] [facility] and [facility] appt Hollye Pritt 24th. Client was given transportation information and trip verification numbers. Khyre Germond W RN CN

## 2022-08-09 ENCOUNTER — Telehealth (HOSPITAL_COMMUNITY): Payer: Self-pay

## 2022-08-09 ENCOUNTER — Ambulatory Visit (HOSPITAL_COMMUNITY): Payer: Medicaid Other | Admitting: Mental Health

## 2022-08-09 NOTE — Telephone Encounter (Signed)
Called to confirm/remind patient of their appointment at the [facility] on 08/10/22.   Patient reminded to bring all medications and/or complete list.  Confirmed patient has transportation. Gave directions, instructed to utilize [place] parking.  Confirmed appointment prior to ending call.

## 2022-08-10 ENCOUNTER — Encounter (HOSPITAL_COMMUNITY): Payer: Medicaid Other

## 2022-08-15 ENCOUNTER — Encounter (HOSPITAL_COMMUNITY): Payer: Medicaid Other | Admitting: Student

## 2022-08-16 ENCOUNTER — Encounter: Payer: Self-pay | Admitting: Neurology

## 2022-08-16 ENCOUNTER — Other Ambulatory Visit (HOSPITAL_COMMUNITY): Payer: Self-pay | Admitting: Emergency Medicine

## 2022-08-16 ENCOUNTER — Telehealth: Payer: Self-pay | Admitting: Neurology

## 2022-08-16 NOTE — Telephone Encounter (Signed)
LVM and sent letter in mail informing pt of need to reschedule 09/12/22 appointment - MD out

## 2022-08-17 ENCOUNTER — Other Ambulatory Visit (HOSPITAL_COMMUNITY): Payer: Self-pay | Admitting: Adult Health

## 2022-08-17 ENCOUNTER — Other Ambulatory Visit: Payer: Self-pay | Admitting: Internal Medicine

## 2022-08-17 ENCOUNTER — Other Ambulatory Visit (HOSPITAL_COMMUNITY): Payer: Self-pay | Admitting: Emergency Medicine

## 2022-08-17 DIAGNOSIS — M545 Other chronic pain: Secondary | ICD-10-CM

## 2022-08-17 NOTE — Progress Notes (Signed)
Paramedicine Encounter    Patient ID: Craig Schmidt, male    DOB: 03-24-1971, 52 y.o.   MRN: [id number]   Complaints Cough with congestion  Assessment No chest pain or SOB.  Lung sounds with ronchi throughout  Compliance with meds not consistent  Pill box filled x 1 week  Refills needed Would like refill on his Mobic  Meds changes since last visit none    Social changes currently staying at [facility]   BP (!) 150/100 (BP Location: Left Arm, Patient Position: Sitting, Cuff Size: Normal)   Pulse 96   Resp 16   Wt 152 lb 3.2 oz (69 kg)   SpO2 98%   BMI 23.84 kg/m  Weight yesterday-not taken Last visit weight-168lb (07/10/22)   Craig Schmidt was supposed to have a home visit with me yesterday but was not at the room on my arrival.  Today finds him currently staying in [facility] which is great change from staying in a tent.    He denies chest pain or SOB.  He does have a lingering productive cough and was recenlty treated for Pneumonia.  He was not compliant with his antibiotic because he says it gave him diarrhea.  I suggested he re-visit his PCP or go to an urgent care and perhaps they may want to put him on another round of antibiotics.  His weight is down.  No edema to his lower extremities.  He has refills at [facility] that I will pick up later this afternoon and drop off to him.  [facility] 08/23/22 @ 12:00.     ACTION: Home visit completed  Craig Schmidt [phone] 08/17/22  Patient Care Team: Craig Herter, NP as PCP - General (Nurse Practitioner) Craig Mayo, MD as PCP - Cardiology (Cardiology)  Patient Active Problem List   Diagnosis Date Noted   Chronic combined systolic and diastolic heart failure ([place])    ST elevation myocardial infarction (STEMI) of inferolateral wall, subsequent episode of care ([place]) 12/19/2021   ST elevation myocardial infarction (STEMI) ([place])     Ischemic cardiomyopathy    Cocaine abuse ([place])    Smoker    Alcohol abuse    Acute ischemic stroke ([place]) 09/08/2021   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   Tear of MCL (medial collateral ligament) of knee 05/20/2015   Protein-calorie malnutrition, severe 05/17/2015   Lactic acidosis 05/15/2015   Left knee pain 05/15/2015   Lung nodules 05/15/2015   Hypoglycemia 05/14/2015   Hypothermia 05/14/2015   Acute encephalopathy 05/14/2015   Cocaine abuse with intoxication ([place]) 05/14/2015   Alcohol intoxication in active alcoholic ([place]) 70/62/3762   Sepsis ([place]) 05/14/2015   Homeless 05/14/2015    Current Outpatient Medications:    apixaban (ELIQUIS) 5 MG TABS tablet, Take 1 tablet (5 mg total) by mouth 2 (two) times daily., Disp: 60 tablet, Rfl: 5   atorvastatin (LIPITOR) 40 MG tablet, TAKE 1 TABLET(40 MG) BY MOUTH DAILY, Disp: 30 tablet, Rfl: 11   carvedilol (COREG) 6.25 MG tablet, Take 1 tablet (6.25 mg total) by mouth 2 (two) times daily., Disp: 180 tablet, Rfl: 3   clopidogrel (PLAVIX) 75 MG tablet, TAKE ONE TABLET BY MOUTH ONCE DAILY, Disp: 30 tablet, Rfl: 6   Glycerin-Hypromellose-PEG 400 0.2-0.2-1 % SOLN, Place 1 drop into both eyes as needed., Disp: 15 mL, Rfl: 0   isosorbide-hydrALAZINE (BIDIL) 20-37.5 MG tablet, Take 1 & 1/2 tablets by  mouth 3 times daily., Disp: 115 tablet, Rfl: 5   potassium chloride SA (KLOR-CON M) 20 MEQ tablet, Take 1 tablet (20 mEq total) by mouth daily., Disp: 30 tablet, Rfl: 11   sacubitril-valsartan (ENTRESTO) 49-51 MG, Take 1 tablet by mouth 2 (two) times daily., Disp: 60 tablet, Rfl: 3   spironolactone (ALDACTONE) 25 MG tablet, Take 0.5 tablets (12.5 mg total) by mouth daily., Disp: 45 tablet, Rfl: 3   torsemide (DEMADEX) 20 MG tablet, Take 1 tablet (20 mg total) by mouth daily., Disp: 30 tablet, Rfl: 11   triamcinolone ointment (KENALOG) 0.5 %, APPLY TOPICALLY TO THE AFFECTED AREA TWICE DAILY, Disp: 60 g, Rfl: 2   azithromycin (ZITHROMAX)  250 MG tablet, Take 1 tablet (250 mg total) by mouth daily. (Patient not taking: Reported on 08/17/2022), Disp: 4 tablet, Rfl: 0   digoxin (LANOXIN) 0.125 MG tablet, TAKE ONE TABLET BY MOUTH ONCE DAILY, Disp: 30 tablet, Rfl: 6   famotidine (PEPCID) 20 MG tablet, TAKE ONE TABLET BY MOUTH ONCE DAILY, Disp: 30 tablet, Rfl: 6   JARDIANCE 10 MG TABS tablet, TAKE ONE TABLET BY MOUTH ONCE DAILY, Disp: 30 tablet, Rfl: 6   lamoTRIgine (LAMICTAL) 25 MG tablet, Take 1 tablet (25 mg total) by mouth daily for 14 days. Then increase to 50 mg daily., Disp: 14 tablet, Rfl: 0   lamoTRIgine (LAMICTAL) 25 MG tablet, Take 2 tablets (50 mg total) by mouth daily for 14 days., Disp: 28 tablet, Rfl: 0   meloxicam (MOBIC) 7.5 MG tablet, TAKE 1 TABLET(7.5 MG) BY MOUTH DAILY, Disp: 30 tablet, Rfl: 1 Allergies  Allergen Reactions   Shellfish Allergy Anaphylaxis   Peanut-Containing Drug Products Itching     Social History   Socioeconomic History   Marital status: Significant Other    Spouse name: Not on file   Number of children: 7   Years of education: Not on file   Highest education level: 9th grade  Occupational History   Occupation: unemployed    Comment: experiencing homelessness-living at [employer] motels.  Tobacco Use   Smoking status: Every Day    Packs/day: 1.00    Years: 41.00    Total pack years: 41.00    Types: Cigarettes    Passive exposure: Current   Smokeless tobacco: Never  Vaping Use   Vaping Use: Never used  Substance and Sexual Activity   Alcohol use: Yes    Alcohol/week: 63.0 standard drinks of alcohol    Types: 63 Cans of beer per week    Comment: 3-40oz beer/day x10 years. 1-2 40oz beer/day in 20s/30s   Drug use: Yes    Types: "Crack" cocaine, Cocaine    Comment: last use 01/13/22   Sexual activity: Not on file  Other Topics Concern   Not on file  Social History Narrative   Not on file   Social Determinants of Health   Financial Resource Strain: High Risk (08/02/2022)    Overall Financial Resource Strain (CARDIA)    Difficulty of Paying Living Expenses: Very hard  Food Insecurity: Food Insecurity Present (08/02/2022)   Hunger Vital Sign    Worried About Running Out of Food in the Last Year: Sometimes true    Ran Out of Food in the Last Year: Sometimes true  Transportation Needs: Unmet Transportation Needs (07/10/2022)   PRAPARE - Hydrologist (Medical): Yes    Lack of Transportation (Non-Medical): Yes  Physical Activity: Not on file  Stress: Not on file  Social  Connections: Not on file  Intimate Partner Violence: Not on file    Physical Exam  Productive cough white/yellow secretions  needs to go to urgent care or primanary- inhaler Needs Mobic, [facility]   Future Appointments  Date Time Provider [place]  08/23/2022 12:00 PM [place] None  09/11/2022  1:30 PM Alwyn Ren, [facility] None  09/19/2022  4:00 PM [name], MD [facility] None  10/09/2022  2:20 PM [name], MD [facility] None

## 2022-08-19 NOTE — Progress Notes (Signed)
No show for visit today.    [name], [place] 08/19/2022

## 2022-08-22 ENCOUNTER — Encounter (HOSPITAL_COMMUNITY): Payer: Self-pay | Admitting: Emergency Medicine

## 2022-08-22 ENCOUNTER — Telehealth (HOSPITAL_COMMUNITY): Payer: Self-pay | Admitting: Licensed Clinical Social Worker

## 2022-08-22 NOTE — Telephone Encounter (Signed)
08/22/2022  Craig Schmidt DOB: [birth year] MRN: [id number]   RIDER WAIVER AND RELEASE OF LIABILITY  For the purposes of helping with transportation needs, [facility] partners with outside transportation providers (taxi companies, [place], Social research officer, government.) to give [employer] patients or other approved people the choice of on-demand rides [employer]") to our buildings for non-emergency visits.  By using [employer], I, the person signing this document, on behalf of myself and/or any legal minors (in my care using the [employer]), agree:  Government social research officer given to me are supplied by independent, outside transportation providers who do not work for, or have any affiliation with, [employer]. [place] is not a transportation company. [place] has no control over the quality or safety of the rides I get using [employer]. [place] has no control over whether any outside ride will happen on time or not. [place] gives no guarantee on the reliability, quality, safety, or availability on any rides, or that no mistakes will happen. I know and accept that traveling by vehicle (car, truck, SVU, Lucianne Lei, bus, taxi, etc.) has risks of serious injuries such as disability, being paralyzed, and death. I know and agree the risk of using [employer] is mine alone, and not [employer]. Transport Services are provided "as is" and as are available. The transportation providers are in charge for all inspections and care of the vehicles used to provide these rides. I agree not to take legal action against [place], its agents, employees, officers, directors, representatives, insurers, attorneys, assigns, successors, subsidiaries, and affiliates at any time for any reasons related directly or indirectly to using [employer]. I also agree not to take legal action against [place] or its affiliates for any injury, death, or damage to property caused by or related to using  [employer]. I have read this Waiver and Release of Liability, and I understand the terms used in it and their legal meaning. This Waiver is freely and voluntarily given with the understanding that my right (or any legal minors) to legal action against [place] relating to [employer] is knowingly given up to use these services.   I attest that I read the Ride Waiver and Release of Liability to Craig Schmidt, gave Craig Schmidt the opportunity to ask questions and answered the questions asked (if any). I affirm that Craig Schmidt then provided consent for assistance with transportation.     Jorge Ny

## 2022-08-22 NOTE — Telephone Encounter (Signed)
H&V Care Navigation CSW Progress Note  Clinical Social Worker consulted to help with transportation to appt.  Has medicaid benefit but is homeless do is often not aware of address he will be at enough days in advance to setup ride.  CSW set up ride through [place]  Patient is participating in a Managed Medicaid Plan:  Yes  [place]: Food Insecurity Present (08/02/2022)  Housing: High Risk (01/01/2022)  Transportation Needs: Unmet Transportation Needs (08/22/2022)  Alcohol Screen: High Risk (09/19/2021)  Depression (PHQ2-9): High Risk (06/13/2022)  Financial Resource Strain: High Risk (08/02/2022)  Tobacco Use: High Risk (08/03/2022)    Jorge Ny, LCSW Clinical Social Worker [facility] Desk#: [phone] Cell#: [phone]

## 2022-08-22 NOTE — Progress Notes (Signed)
Dropped off bus passes and prescription for Mobic. He has Clinic visit tomorrow @ 12:00    [name], [place] 08/22/2022

## 2022-08-23 ENCOUNTER — Other Ambulatory Visit (HOSPITAL_COMMUNITY): Payer: Self-pay | Admitting: Emergency Medicine

## 2022-08-23 ENCOUNTER — Ambulatory Visit (HOSPITAL_COMMUNITY)
Admission: RE | Admit: 2022-08-23 | Discharge: 2022-08-23 | Disposition: A | Discharge status: Home / Self Care | Payer: Medicaid Other | Source: Ambulatory Visit | Attending: Adult Health | Admitting: Adult Health

## 2022-08-23 ENCOUNTER — Encounter (HOSPITAL_COMMUNITY): Payer: Self-pay

## 2022-08-23 VITALS — BP 140/90 | HR 95 | Ht 67.0 in | Wt 155.8 lb

## 2022-08-23 DIAGNOSIS — I110 Hypertensive heart disease with heart failure: Secondary | ICD-10-CM | POA: Insufficient documentation

## 2022-08-23 DIAGNOSIS — I5022 Chronic systolic (congestive) heart failure: Secondary | ICD-10-CM

## 2022-08-23 DIAGNOSIS — F1090 Alcohol use, unspecified, uncomplicated: Secondary | ICD-10-CM | POA: Insufficient documentation

## 2022-08-23 DIAGNOSIS — I10 Essential (primary) hypertension: Secondary | ICD-10-CM

## 2022-08-23 DIAGNOSIS — I69351 Hemiplegia and hemiparesis following cerebral infarction affecting right dominant side: Secondary | ICD-10-CM | POA: Diagnosis not present

## 2022-08-23 DIAGNOSIS — Z5900 Homelessness unspecified: Secondary | ICD-10-CM | POA: Diagnosis not present

## 2022-08-23 DIAGNOSIS — Z79899 Other long term (current) drug therapy: Secondary | ICD-10-CM | POA: Insufficient documentation

## 2022-08-23 DIAGNOSIS — Z7902 Long term (current) use of antithrombotics/antiplatelets: Secondary | ICD-10-CM | POA: Insufficient documentation

## 2022-08-23 DIAGNOSIS — Z9181 History of falling: Secondary | ICD-10-CM | POA: Insufficient documentation

## 2022-08-23 DIAGNOSIS — F17210 Nicotine dependence, cigarettes, uncomplicated: Secondary | ICD-10-CM | POA: Diagnosis not present

## 2022-08-23 DIAGNOSIS — Z7901 Long term (current) use of anticoagulants: Secondary | ICD-10-CM | POA: Diagnosis not present

## 2022-08-23 DIAGNOSIS — Z5901 Sheltered homelessness: Secondary | ICD-10-CM | POA: Diagnosis not present

## 2022-08-23 DIAGNOSIS — F1410 Cocaine abuse, uncomplicated: Secondary | ICD-10-CM | POA: Insufficient documentation

## 2022-08-23 DIAGNOSIS — I2510 Atherosclerotic heart disease of native coronary artery without angina pectoris: Secondary | ICD-10-CM | POA: Diagnosis not present

## 2022-08-23 DIAGNOSIS — H818X9 Other disorders of vestibular function, unspecified ear: Secondary | ICD-10-CM | POA: Diagnosis not present

## 2022-08-23 DIAGNOSIS — I429 Cardiomyopathy, unspecified: Secondary | ICD-10-CM | POA: Insufficient documentation

## 2022-08-23 DIAGNOSIS — I059 Rheumatic mitral valve disease, unspecified: Secondary | ICD-10-CM | POA: Diagnosis not present

## 2022-08-23 DIAGNOSIS — F12929 Cannabis use, unspecified with intoxication, unspecified: Secondary | ICD-10-CM | POA: Insufficient documentation

## 2022-08-23 DIAGNOSIS — I252 Old myocardial infarction: Secondary | ICD-10-CM | POA: Insufficient documentation

## 2022-08-23 DIAGNOSIS — F1910 Other psychoactive substance abuse, uncomplicated: Secondary | ICD-10-CM | POA: Diagnosis not present

## 2022-08-23 NOTE — Progress Notes (Signed)
Paramedicine Encounter   Patient ID: Craig Schmidt , male,   DOB: 01/22/1971,51 y.o.,  MRN: [id number]   Met patient in clinic today with provider.  Time spent with patient 35 minutes  Pt's BP running high.  He has only taken his Bidil prior to this visit.  He is only being compliant with about half his meds.  His girlfriend fills his pill box and I have reviewed for accuracy.  Amy discussed with him the importance of med compliance and nutritional choices as well as decreasing his alcohol, smoking and cocaine use.  No med changes at this time.  Work on improving med compliance and lifestyle changes .  [name], [place] 08/23/2022

## 2022-08-23 NOTE — Progress Notes (Signed)
-  Advanced Heart Failure Clinic Note   Primary Care: [name], NP HF Cardiologist: Dr. Aundra Dubin  HPI: Craig Schmidt is a 52 y.o. 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   Admitted 02/23 with left MCA CVA s/p tenecteplase. Stroke felt to be likely secondary to cardiomyopathy. Echo EF 25-30%,k RV mildly reduced, RVSP 50 mmHg, moderate MS, mild to moderate MR, moderate to severe TR, dilated IVC. TEE 02/23: EF 25-30%, RV severely reduced, MV stenotic (? Parachute mitral valve), MVA 1.45 cm2 consistent with severe MS, moderate MR, no interatrial shunt, no LV or LAA thrombus. Cardiology consulted. CM likely d/t subtance abuse +/- uncontrolled HTN. Had been started on coumadin but later switched to eliquis d/t concern for compliance given his social situation.  Admitted 5/23 with acute inferolateral STEMI. Had used cocaine 24 hrs prior to presentation. Emergent LHC with 100% apical LAD suspected embolic vs plaque rupture with thrombosis. LVEDP 40. Echo this admit showed EF < 20%, ? RV okay. He was significantly volume overloaded and hypertensive. Started on lasix gtt and nitro gtt. Had good diuresis but Scr began to trend up, 1.5>1.77>1.87>2.0.  Developed soft BP and BP-active meds held. There was concern for low-output. PICC placed and Co-ox within normal range. Drips weaned and GDMT started. He was discharged home, weight 156 lbs.  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   R/LHC (9/23) arranged to assess mitral stenosis, which showed primarily pulmonary venous hypertension, normal RA pressure and minimally elevated PCWP, and moderate mitral stenosis.   Follow up 11/23, stable NYHA II symptoms and  euvolemic.  Presented to the ED 08/03/22 with chest pain. Atypical chest pain. HS Trop. WBC was elevated. Given antibiotics.   Today he returns for HF follow up.Overall feeling ok but still having a cough. SOB with exertion.  Denies PND/Orthopnea. Appetite ok. He has been eating [place] and other take out. No fever or chills. Weight at home  pounds. Missing his medications 50% of the time. Has not had his medications today.  Drinking 40 ounces of beer a day. Using cocaine once a week. Smokes marijuana daily and cigarettes daily. Lives with girlfriend. Followed by HF Paramedicine. Lives in [place] otherwise homeless. Followed by HF Paramedicine.   ReDs: 41%  Labs (6/23): K 3.8, creatinine 1.75 Labs (7/23): K 4.3, creatinine 1.39 Labs (8/23): digoxin 0.2, K 4.9, creatinine 1.37 Labs (9/23): K 4.3, creatinine 1.26 Labs (11/23): K 4.1, creatinine 1.65  PMH: 1. CVA: Left MCA CVA in 2/23, treated with tenecteplase.  Thought to be due to low EF/cardiomyopathy.  2. Left vestibular schwannoma.  3. Hyperlipidemia 4. H/o cocaine abuse. 5. H/o ETOH abuse.  6. 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  Most recent echo in 9/23 showed moderate mitral stenosis with trivial MR.  - R/LHC (9/23) showed moderate mitral stenosis, mitral valve mean gradient 10.9 mmHg, MVA 1.74 cm^2 7. Chronic systolic CHF: Mixed ischemic/nonischemic CMP.  Cocaine/ETOH abuse, HTN, CAD.  - Echo (2/23):  EF 25-30%, RV mildly reduced, RVSP 50 mmHg, moderate MS, mild to moderate MR, moderate to severe TR, dilated IVC - Echo (5/23):  EF < 20% - Echo (9/23):  EF 30-35%, global hypokinesis with apical akinesis, mildly decreased RV systolic function, rheumatic mitral valve  with mean gradient 6 mmHg and MVA 1.69 cm^2 (moderate MS, trivial MR). - Inferolateral MI 5/23 with LHC showing occlusion of the apical LAD.  - R/LHC (9/23): RA mean 4, PA 50/20 (mean 33), PCWP 15, CO/CI (Fick) 5.74/3.06, PVR 3.1 WU 8. CAD: Inferolateral STEMI  in 5/23 with LHC showing occluded apical LAD managed medically (?embolic vs plaque rupture).   Past Medical History:  Diagnosis Date   Asthma    Brain tumor ([place])    MI (myocardial infarction) ([place])    Stroke [facility])    Current Outpatient Medications  Medication Sig Dispense Refill   apixaban (ELIQUIS) 5 MG TABS tablet Take 1 tablet (5 mg total) by mouth 2 (two) times daily. 60 tablet 5   atorvastatin (LIPITOR) 40 MG tablet TAKE 1 TABLET(40 MG) BY MOUTH DAILY 30 tablet 11   carvedilol (COREG) 6.25 MG tablet Take 1 tablet (6.25 mg total) by mouth 2 (two) times daily. 180 tablet 3   clopidogrel (PLAVIX) 75 MG tablet TAKE ONE TABLET BY MOUTH ONCE DAILY 30 tablet 6   digoxin (LANOXIN) 0.125 MG tablet TAKE ONE TABLET BY MOUTH ONCE DAILY 30 tablet 6   ENTRESTO 49-51 MG TAKE ONE TABLET BY MOUTH TWICE DAILY 60 tablet 3   famotidine (PEPCID) 20 MG tablet TAKE ONE TABLET BY MOUTH ONCE DAILY 30 tablet 6   Glycerin-Hypromellose-PEG 400 0.2-0.2-1 % SOLN Place 1 drop into both eyes as needed. 15 mL 0   isosorbide-hydrALAZINE (BIDIL) 20-37.5 MG tablet Take 1 & 1/2 tablets by mouth 3 times daily. 115 tablet 5   JARDIANCE 10 MG TABS tablet TAKE ONE TABLET BY MOUTH ONCE DAILY 30 tablet 6   meloxicam (MOBIC) 7.5 MG tablet TAKE ONE TABLET BY MOUTH ONCE DAILY 30 tablet 1   potassium chloride SA (KLOR-CON M) 20 MEQ tablet Take 1 tablet (20 mEq total) by mouth daily. 30 tablet 11   spironolactone (ALDACTONE) 25 MG tablet Take 0.5 tablets (12.5 mg total) by mouth daily. 45 tablet 3   torsemide (DEMADEX) 20 MG tablet Take 1 tablet (20 mg total) by mouth daily. 30 tablet 11   triamcinolone ointment (KENALOG) 0.5 % APPLY TOPICALLY TO THE AFFECTED AREA TWICE DAILY 60 g 2   lamoTRIgine (LAMICTAL) 25 MG tablet Take 1 tablet (25 mg total) by mouth daily for 14 days. Then increase to 50 mg daily. 14 tablet 0   lamoTRIgine (LAMICTAL) 25 MG tablet Take 2 tablets (50 mg total) by mouth daily for 14 days. 28 tablet 0   No  current facility-administered medications for this encounter.   Allergies  Allergen Reactions   Shellfish Allergy Anaphylaxis   Peanut-Containing Drug Products Itching   Social History   Socioeconomic History   Marital status: Significant Other    Spouse name: Not on file   Number of children: 7   Years of education: Not on file   Highest education level: 9th grade  Occupational History   Occupation: unemployed    Comment: experiencing homelessness-living at daily rent motels.  Tobacco Use   Smoking status: Every Day    Packs/day: 1.00    Years: 41.00    Total pack years: 41.00    Types: Cigarettes    Passive exposure: Current   Smokeless tobacco: Never  Vaping Use   Vaping Use: Never used  Substance and Sexual Activity   Alcohol use: Yes    Alcohol/week: 63.0 standard drinks of alcohol    Types: 63 Cans of beer per week  Comment: 3-40oz beer/day x10 years. 1-2 40oz beer/day in 20s/30s   Drug use: Yes    Types: "Crack" cocaine, Cocaine    Comment: last use 01/13/22   Sexual activity: Not on file  Other Topics Concern   Not on file  Social History Narrative   Not on file   Social Determinants of Health   Financial Resource Strain: High Risk (08/02/2022)   Overall Financial Resource Strain (CARDIA)    Difficulty of Paying Living Expenses: Very hard  Food Insecurity: Food Insecurity Present (08/02/2022)   Hunger Vital Sign    Worried About Running Out of Food in the Last Year: Sometimes true    Ran Out of Food in the Last Year: Sometimes true  Transportation Needs: Unmet Transportation Needs (08/22/2022)   PRAPARE - Hydrologist (Medical): Yes    Lack of Transportation (Non-Medical): Yes  Physical Activity: Not on file  Stress: Not on file  Social Connections: Not on file  Intimate Partner Violence: Not on file   Family History  Problem Relation Age of Onset   Diabetes Mother     BP (!) 140/90   Pulse 95   Ht '5\' 7"'$  (1.702 m)    Wt 70.7 kg (155 lb 12.8 oz)   SpO2 98%   BMI 24.40 kg/m   Wt Readings from Last 3 Encounters:  08/23/22 70.7 kg (155 lb 12.8 oz)  08/17/22 69 kg (152 lb 3.2 oz)  07/21/22 76.2 kg (168 lb)   PHYSICAL EXAM: General:  Well appearing. No resp difficulty HEENT: normal Neck: supple. no JVD. Carotids 2+ bilat; no bruits. No lymphadenopathy or thryomegaly appreciated. Cor: PMI nondisplaced. Regular rate & rhythm. No rubs, gallops or murmurs. Lungs: clear Abdomen: soft, nontender, nondistended. No hepatosplenomegaly. No bruits or masses. Good bowel sounds. Extremities: no cyanosis, clubbing, rash, edema Neuro: alert & orientedx3, cranial nerves grossly intact. moves all 4 extremities w/o difficulty. Affect pleasant  ASSESSMENT & PLAN: 1. CAD: Admit 6/23 with inferolateral STEMI by ECG, cath showed occluded apical LAD.  Cannot rule out plaque rupture in setting of cocaine abuse, but given history of suspected cardioembolic CVA, wonder if this was not a cardioembolic MI.  No chest pain.  - Continue atorvastatin  - Continue Plavix (post-MI, continue 1 year as long as no concerning bleeding) + Eliquis.  Think he should remain anticoagulated (was supposed to be on prior to admission but not sure if he was taking) given suspected cardioembolism.  2. Chronic systolic CHF: Patient was found to have cardiomyopathy in 2/23 at time of admission for CVA.  Echo showed EF 25-30% at that time.  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  MI in 5/46 (?cardioembolic) also contributes.  Echo in 5/23 showed LV EF < 20%, RV mildly decreased function, abnormal mitral valve looks rheumatic => suspect moderate mitral stenosis at least with planimetered MVA 1.6 cm^2 and calculated MVA by VTI 0.8 cm^2; mean gradient only 6 mmHg but likely function of low cardiac output. The IVC was small/non-dilated. Echo 9/23 showed EF 30-35% with mildly decreased RV systolic  function. RHC (9/23) showed normal RA pressure, mildly elevated PCWP.   - NYHA III chronically.  He has not had medications today and misses them about 50% of the time.  - Volume status elevated but not surprised . Reinforced medication compliance.  - Continue torsemide 20 mg daily + 20 KCL daily. - Continue Entresto 49/51 mg bid.  -  Continue digoxin 0.125.  - Continue Bidil 1 tab tid. - Continue Coreg 6.25 mg bid.  - Continue Jardiance 10 mg daily.   - Continue spironolactone 12.5 mg daily. - If he remains off cocaine and EF stays low, would be ICD candidate (narrow QRS so no CRT).  3. HTN  -Elevated.  - As above he has not had medications.  4. CVA: 2/23, has residual right-sided weakness.  Thought to be cardioembolic at the time, started on anticoagulation.   - Using Eliquis given concerns with compliance with warfarin INR checks. No abnormal bleeding. 5. Cocaine abuse:  Ongoing use. Discussed cessation.  6. 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   Echo 9/23 shows moderate MS with trivial MR. Unusual given age. He denies known rheumatic fever though he remembers have strep throat at least twice as a child. L/RHC (9/23) showed moderate mitral stenosis, with mitral valve mean gradient 10.9 mmHg, MVA 1.74 cm^2. He is not overly symptomatic. 7. Homeless: Has a dog. HFSW helping with resources.  8. Vestibular schwannoma: Has had vertigo and falls. No falls recently. Suspect this could be contributing to recent symptoms.      Follow in 3-4 weeks.  Needs to continue HF Paramedicine. Needs assistance with transportation.     Vernette Moise NP-C  08/23/2022

## 2022-08-23 NOTE — Progress Notes (Signed)
ReDS Vest / Clip - 08/23/22 1214       ReDS Vest / Clip   Station Marker C    Ruler Value 34    ReDS Value Range High volume overload    ReDS Actual Value 41

## 2022-08-23 NOTE — Transportation (Signed)
H&V Care Navigation CSW Progress Note  CSW arranged transport from clinic back to hotel   Patient is participating in a Managed Medicaid Plan:  Yes  [place]: Food Insecurity Present (08/02/2022)  Housing: High Risk (01/01/2022)  Transportation Needs: Unmet Transportation Needs (08/23/2022)  Alcohol Screen: High Risk (09/19/2021)  Depression (PHQ2-9): High Risk (06/13/2022)  Financial Resource Strain: High Risk (08/02/2022)  Tobacco Use: High Risk (08/23/2022)     Jorge Ny, [facility] Desk#: [phone] Cell#: [phone]

## 2022-08-23 NOTE — Patient Instructions (Addendum)
Good to see you today!  No medication changes   Your physician recommends that you schedule a follow-up appointment in: 4 weeks with app clinic  If you have any questions or concerns before your next appointment please send [place] a message through [place] or call our office at [phone].    TO LEAVE A MESSAGE FOR THE NURSE SELECT OPTION 2, PLEASE LEAVE A MESSAGE INCLUDING: YOUR NAME DATE OF BIRTH CALL BACK NUMBER REASON FOR CALL**this is important as we prioritize the call backs  YOU WILL RECEIVE A CALL BACK THE SAME DAY AS LONG AS YOU CALL BEFORE 4:00 PM At the [facility], you and your health needs are our priority. As part of our continuing mission to provide you with exceptional heart care, we have created designated Provider Care Teams. These Care Teams include your primary Cardiologist (physician) and Advanced Practice Providers (APPs- Physician Assistants and Nurse Practitioners) who all work together to provide you with the care you need, when you need it.   You may see any of the following providers on your designated Care Team at your next follow up: Dr Glori Bickers Dr Loralie Champagne Dr. Roxana Hires, NP [name], [place] [facility] [place], [place] Forestine Na, NP Audry Riles, PharmD   Please be sure to bring in all your medications bottles to every appointment.    Thank you for choosing [facility]

## 2022-08-24 DIAGNOSIS — R32 Unspecified urinary incontinence: Secondary | ICD-10-CM | POA: Diagnosis not present

## 2022-08-28 ENCOUNTER — Telehealth (HOSPITAL_COMMUNITY): Payer: Self-pay | Admitting: *Deleted

## 2022-08-28 ENCOUNTER — Other Ambulatory Visit (HOSPITAL_COMMUNITY): Payer: Self-pay | Admitting: Emergency Medicine

## 2022-08-28 MED ORDER — ENTRESTO 24-26 MG PO TABS: 1.0000 | ORAL_TABLET | Freq: Two times a day (BID) | ORAL | 3 refills | Status: DC

## 2022-08-28 MED ORDER — ISOSORB DINITRATE-HYDRALAZINE 20-37.5 MG PO TABS: 0.5000 | ORAL_TABLET | Freq: Three times a day (TID) | ORAL | 5 refills | Status: DC

## 2022-08-28 NOTE — Progress Notes (Signed)
Paramedicine Encounter    Patient ID: Craig Schmidt, male    DOB: 11-Nov-1970, 52 y.o.   MRN: [id number]   Complaints dizziness when standing  Assessment No chest pain or SOB.  Lung sounds w/ ronchi in lower lobes and clear in the upper.  Productive cough w/ dark yellow sputum.  Afebrile. No edema  Compliance with meds Yes  Pill box filled x 1 week  Refills needed none  Meds changes since last visit None    Social changes None   Resp 16  Weight yesterday-not taken Last visit weight-155lb  [name] was A&O x 4, skin cool and dry with good color.  He denies chest pain or SOB, no edema noted.  He complains today of dizziness upon standing.  No dizziness when he is supine.  Pt w/ + orthostatic changes.  Contacted HF triage and received orders: Hold  Carvedilol, and decrease Entresto to 24-26 BID and Bidil  1/2 tablet TID.  If pt's BP <361 Systolic hold Carvedilol, Bidil, Entresto and Spiro.  Med box reconciled to reflect these changes.  Reviewed instructions with [name] girlfriend and provided her with written instructions as well.  I will contact in the a.m. to get vitals and follow up with med changes accordingly. Advised pt should he have increased dizziness, chest pain or SOB to call 911.    ACTION: Home visit completed  [name] [phone] 08/28/22  Patient Care Team: [name], NP as PCP - General (Nurse Practitioner) Janina Mayo, MD as PCP - Cardiology (Cardiology)  Patient Active Problem List   Diagnosis Date Noted   Chronic combined systolic and diastolic heart failure ([place])    ST elevation myocardial infarction (STEMI) of inferolateral wall, subsequent episode of care ([place]) 12/19/2021   ST elevation myocardial infarction (STEMI) ([place])    Ischemic cardiomyopathy    Cocaine abuse ([place])    Smoker    Alcohol abuse    Acute ischemic stroke ([place]) 09/08/2021   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[place]) 05/20/2015   Tear of MCL  (medial collateral ligament) of knee 05/20/2015   Protein-calorie malnutrition, severe 05/17/2015   Lactic acidosis 05/15/2015   Left knee pain 05/15/2015   Lung nodules 05/15/2015   Hypoglycemia 05/14/2015   Hypothermia 05/14/2015   Acute encephalopathy 05/14/2015   Cocaine abuse with intoxication ([place]) 05/14/2015   Alcohol intoxication in active alcoholic ([place]) 76/19/5093   Sepsis ([place]) 05/14/2015   Homeless 05/14/2015    Current Outpatient Medications:    apixaban (ELIQUIS) 5 MG TABS tablet, Take 1 tablet (5 mg total) by mouth 2 (two) times daily., Disp: 60 tablet, Rfl: 5   atorvastatin (LIPITOR) 40 MG tablet, TAKE 1 TABLET(40 MG) BY MOUTH DAILY, Disp: 30 tablet, Rfl: 11   carvedilol (COREG) 6.25 MG tablet, Take 1 tablet (6.25 mg total) by mouth 2 (two) times daily., Disp: 180 tablet, Rfl: 3   clopidogrel (PLAVIX) 75 MG tablet, TAKE ONE TABLET BY MOUTH ONCE DAILY, Disp: 30 tablet, Rfl: 6   digoxin (LANOXIN) 0.125 MG tablet, TAKE ONE TABLET BY MOUTH ONCE DAILY, Disp: 30 tablet, Rfl: 6   famotidine (PEPCID) 20 MG tablet, TAKE ONE TABLET BY MOUTH ONCE DAILY, Disp: 30 tablet, Rfl: 6   Glycerin-Hypromellose-PEG 400 0.2-0.2-1 % SOLN, Place 1 drop into both eyes as needed., Disp: 15 mL, Rfl: 0   isosorbide-hydrALAZINE (BIDIL) 20-37.5 MG tablet, Take 0.5 tablets by mouth 3 (three) times daily. Take 1 & 1/2 tablets by mouth 3 times daily., Disp:  115 tablet, Rfl: 5   JARDIANCE 10 MG TABS tablet, TAKE ONE TABLET BY MOUTH ONCE DAILY, Disp: 30 tablet, Rfl: 6   lamoTRIgine (LAMICTAL) 25 MG tablet, Take 1 tablet (25 mg total) by mouth daily for 14 days. Then increase to 50 mg daily., Disp: 14 tablet, Rfl: 0   lamoTRIgine (LAMICTAL) 25 MG tablet, Take 2 tablets (50 mg total) by mouth daily for 14 days., Disp: 28 tablet, Rfl: 0   meloxicam (MOBIC) 7.5 MG tablet, TAKE ONE TABLET BY MOUTH ONCE DAILY, Disp: 30 tablet, Rfl: 1   potassium chloride SA (KLOR-CON M) 20 MEQ tablet, Take 1 tablet (20 mEq total)  by mouth daily., Disp: 30 tablet, Rfl: 11   sacubitril-valsartan (ENTRESTO) 24-26 MG, Take 1 tablet by mouth 2 (two) times daily., Disp: 60 tablet, Rfl: 3   spironolactone (ALDACTONE) 25 MG tablet, Take 0.5 tablets (12.5 mg total) by mouth daily., Disp: 45 tablet, Rfl: 3   torsemide (DEMADEX) 20 MG tablet, Take 1 tablet (20 mg total) by mouth daily., Disp: 30 tablet, Rfl: 11   triamcinolone ointment (KENALOG) 0.5 %, APPLY TOPICALLY TO THE AFFECTED AREA TWICE DAILY, Disp: 60 g, Rfl: 2 Allergies  Allergen Reactions   Shellfish Allergy Anaphylaxis   Peanut-Containing Drug Products Itching     Social History   Socioeconomic History   Marital status: Significant Other    Spouse name: Not on file   Number of children: 7   Years of education: Not on file   Highest education level: 9th grade  Occupational History   Occupation: unemployed    Comment: experiencing homelessness-living at daily rent motels.  Tobacco Use   Smoking status: Every Day    Packs/day: 1.00    Years: 41.00    Total pack years: 41.00    Types: Cigarettes    Passive exposure: Current   Smokeless tobacco: Never  Vaping Use   Vaping Use: Never used  Substance and Sexual Activity   Alcohol use: Yes    Alcohol/week: 63.0 standard drinks of alcohol    Types: 63 Cans of beer per week    Comment: 3-40oz beer/day x10 years. 1-2 40oz beer/day in 20s/30s   Drug use: Yes    Types: "Crack" cocaine, Cocaine    Comment: last use 01/13/22   Sexual activity: Not on file  Other Topics Concern   Not on file  Social History Narrative   Not on file   Social Determinants of Health   Financial Resource Strain: High Risk (08/02/2022)   Overall Financial Resource Strain (CARDIA)    Difficulty of Paying Living Expenses: Very hard  Food Insecurity: Food Insecurity Present (08/02/2022)   Hunger Vital Sign    Worried About Running Out of Food in the Last Year: Sometimes true    Ran Out of Food in the Last Year: Sometimes true   Transportation Needs: Unmet Transportation Needs (08/23/2022)   PRAPARE - Hydrologist (Medical): Yes    Lack of Transportation (Non-Medical): Yes  Physical Activity: Not on file  Stress: Not on file  Social Connections: Not on file  Intimate Partner Violence: Not on file    Physical Exam      Future Appointments  Date Time Provider [place]  09/11/2022  1:30 PM Alwyn Ren [facility] None  09/19/2022  4:00 PM [name], MD [facility] None  09/20/2022  1:30 PM [facility] PA/NP [facility] None  10/09/2022  2:20 PM [name], MD [facility] None

## 2022-08-28 NOTE — Telephone Encounter (Signed)
Dede with paramedicine called to report pt is dizzy and orthostatic. Per Janett Billow [place],[name] hold evening meds. Decrease Entresto to 49/'51mg'$  twice daily and decrease Bidil to 1/2 table three times  a day.   If systolic bp less than 990 hold carvedilol, bidil, entresto, and spiro. Dede aware.

## 2022-08-29 ENCOUNTER — Other Ambulatory Visit (HOSPITAL_COMMUNITY): Payer: Self-pay | Admitting: Emergency Medicine

## 2022-08-29 ENCOUNTER — Telehealth (HOSPITAL_COMMUNITY): Payer: Self-pay | Admitting: Licensed Clinical Social Worker

## 2022-08-29 DIAGNOSIS — I5022 Chronic systolic (congestive) heart failure: Secondary | ICD-10-CM

## 2022-08-29 NOTE — Progress Notes (Signed)
Paramedicine Encounter    Patient ID: Craig Schmidt, male    DOB: 08-Jun-1971, 52 y.o.   MRN: [id number]  Today is a follow up visit from yesterday that involved med changes.  He was instructed that if his BP was <948 systolic to hold his Carvedilol, Bidil, Entresto and Spiro.  His girlfriend reported this morning that his pressures were 104/57 supine, 85/58 sitting and 83/41 standing.  He denies dizziness with these pressure.  He went ahead and took his morning meds without omitting  the specified medications.  Tonight advised him to take only his Atorvastatin and Eliquis and I will visit him in the morning to do more orthostatic BP's.  Also advised him to contact 911 should he have complaints of dizziness, chest pain or SOB.  Resp 16   SpO2 99%  Weight yesterday-not taken Last visit weight-152lb  Patient Care Team: [name], NP as PCP - General (Nurse Practitioner) Janina Mayo, MD as PCP - Cardiology (Cardiology)  Patient Active Problem List   Diagnosis Date Noted   Chronic combined systolic and diastolic heart failure (HCC)    ST elevation myocardial infarction (STEMI) of inferolateral wall, subsequent episode of care ([place]) 12/19/2021   ST elevation myocardial infarction (STEMI) ([place])    Ischemic cardiomyopathy    Cocaine abuse ([place])    Smoker    Alcohol abuse    Acute ischemic stroke ([place]) 09/08/2021   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[place]) 05/20/2015   Tear of MCL (medial collateral ligament) of knee 05/20/2015   Protein-calorie malnutrition, severe 05/17/2015   Lactic acidosis 05/15/2015   Left knee pain 05/15/2015   Lung nodules 05/15/2015   Hypoglycemia 05/14/2015   Hypothermia 05/14/2015   Acute encephalopathy 05/14/2015   Cocaine abuse with intoxication ([place]) 05/14/2015   Alcohol intoxication in active alcoholic ([place]) 54/62/7035   Sepsis ([place]) 05/14/2015   Homeless 05/14/2015    Current Outpatient Medications:    apixaban (ELIQUIS) 5 MG TABS tablet, Take  1 tablet (5 mg total) by mouth 2 (two) times daily., Disp: 60 tablet, Rfl: 5   atorvastatin (LIPITOR) 40 MG tablet, TAKE 1 TABLET(40 MG) BY MOUTH DAILY, Disp: 30 tablet, Rfl: 11   carvedilol (COREG) 6.25 MG tablet, Take 1 tablet (6.25 mg total) by mouth 2 (two) times daily., Disp: 180 tablet, Rfl: 3   clopidogrel (PLAVIX) 75 MG tablet, TAKE ONE TABLET BY MOUTH ONCE DAILY, Disp: 30 tablet, Rfl: 6   digoxin (LANOXIN) 0.125 MG tablet, TAKE ONE TABLET BY MOUTH ONCE DAILY, Disp: 30 tablet, Rfl: 6   famotidine (PEPCID) 20 MG tablet, TAKE ONE TABLET BY MOUTH ONCE DAILY, Disp: 30 tablet, Rfl: 6   Glycerin-Hypromellose-PEG 400 0.2-0.2-1 % SOLN, Place 1 drop into both eyes as needed., Disp: 15 mL, Rfl: 0   isosorbide-hydrALAZINE (BIDIL) 20-37.5 MG tablet, Take 0.5 tablets by mouth 3 (three) times daily. Take 1 & 1/2 tablets by mouth 3 times daily., Disp: 115 tablet, Rfl: 5   JARDIANCE 10 MG TABS tablet, TAKE ONE TABLET BY MOUTH ONCE DAILY, Disp: 30 tablet, Rfl: 6   lamoTRIgine (LAMICTAL) 25 MG tablet, Take 1 tablet (25 mg total) by mouth daily for 14 days. Then increase to 50 mg daily., Disp: 14 tablet, Rfl: 0   lamoTRIgine (LAMICTAL) 25 MG tablet, Take 2 tablets (50 mg total) by mouth daily for 14 days., Disp: 28 tablet, Rfl: 0   meloxicam (MOBIC) 7.5 MG tablet, TAKE ONE TABLET BY MOUTH ONCE DAILY, Disp: 30 tablet, Rfl: 1  potassium chloride SA (KLOR-CON M) 20 MEQ tablet, Take 1 tablet (20 mEq total) by mouth daily., Disp: 30 tablet, Rfl: 11   sacubitril-valsartan (ENTRESTO) 24-26 MG, Take 1 tablet by mouth 2 (two) times daily., Disp: 60 tablet, Rfl: 3   spironolactone (ALDACTONE) 25 MG tablet, Take 0.5 tablets (12.5 mg total) by mouth daily., Disp: 45 tablet, Rfl: 3   torsemide (DEMADEX) 20 MG tablet, Take 1 tablet (20 mg total) by mouth daily., Disp: 30 tablet, Rfl: 11   triamcinolone ointment (KENALOG) 0.5 %, APPLY TOPICALLY TO THE AFFECTED AREA TWICE DAILY, Disp: 60 g, Rfl: 2 Allergies  Allergen  Reactions   Shellfish Allergy Anaphylaxis   Peanut-Containing Drug Products Itching      Social History   Socioeconomic History   Marital status: Significant Other    Spouse name: Not on file   Number of children: 7   Years of education: Not on file   Highest education level: 9th grade  Occupational History   Occupation: unemployed    Comment: experiencing homelessness-living at daily rent motels.  Tobacco Use   Smoking status: Every Day    Packs/day: 1.00    Years: 41.00    Total pack years: 41.00    Types: Cigarettes    Passive exposure: Current   Smokeless tobacco: Never  Vaping Use   Vaping Use: Never used  Substance and Sexual Activity   Alcohol use: Yes    Alcohol/week: 63.0 standard drinks of alcohol    Types: 63 Cans of beer per week    Comment: 3-40oz beer/day x10 years. 1-2 40oz beer/day in 20s/30s   Drug use: Yes    Types: "Crack" cocaine, Cocaine    Comment: last use 01/13/22   Sexual activity: Not on file  Other Topics Concern   Not on file  Social History Narrative   Not on file   Social Determinants of Health   Financial Resource Strain: High Risk (08/02/2022)   Overall Financial Resource Strain (CARDIA)    Difficulty of Paying Living Expenses: Very hard  Food Insecurity: Food Insecurity Present (08/02/2022)   Hunger Vital Sign    Worried About Running Out of Food in the Last Year: Sometimes true    Ran Out of Food in the Last Year: Sometimes true  Transportation Needs: Unmet Transportation Needs (08/23/2022)   PRAPARE - Hydrologist (Medical): Yes    Lack of Transportation (Non-Medical): Yes  Physical Activity: Not on file  Stress: Not on file  Social Connections: Not on file  Intimate Partner Violence: Not on file    Physical Exam      Future Appointments  Date Time Provider [place]  09/11/2022  1:30 PM Alwyn Ren [facility] None  09/19/2022  4:00 PM [name], MD [facility] None   09/20/2022  1:30 PM [facility] PA/NP [facility] None  10/09/2022  2:20 PM [name], MD [place] None       [name], [facility] Paramedic  08/29/22

## 2022-08-29 NOTE — Telephone Encounter (Signed)
H&V Care Navigation CSW Progress Note  Clinical Social Worker placed order for Managed Medicaid team follow up to assist with pt multiple social and medical barriers to success- focus on homelessness, frequent ED utilization, issues with transportation.  Patient is participating in a Managed Medicaid Plan:  Yes  [place]: Food Insecurity Present (08/02/2022)  Housing: High Risk (01/01/2022)  Transportation Needs: Unmet Transportation Needs (08/23/2022)  Alcohol Screen: High Risk (09/19/2021)  Depression (PHQ2-9): High Risk (06/13/2022)  Financial Resource Strain: High Risk (08/02/2022)  Tobacco Use: High Risk (08/23/2022)    Jorge Ny, LCSW Clinical Social Worker [facility] Desk#: [phone] Cell#: [phone]

## 2022-08-30 ENCOUNTER — Telehealth (HOSPITAL_COMMUNITY): Payer: Self-pay

## 2022-08-30 ENCOUNTER — Telehealth (HOSPITAL_COMMUNITY): Payer: Self-pay | Admitting: Licensed Clinical Social Worker

## 2022-08-30 ENCOUNTER — Other Ambulatory Visit (HOSPITAL_COMMUNITY): Payer: Self-pay | Admitting: Emergency Medicine

## 2022-08-30 NOTE — Progress Notes (Signed)
Paramedicine Encounter    Patient ID: Craig Schmidt, male    DOB: [birth year], 51 y.o.   MRN: [id number]   Complaints NONE  Assessment No chest pain, no SOB.  No dizziness. Lung sounds clear, no edema  Compliance with meds yes w/ modifications  Pill box filled n/a  Refills needed none  Meds changes since last visit Held Bidil, Carvedilol Entresto PM dose    Social changes NONE   BP (!) 90/0 (BP Location: Left Arm, Patient Position: Standing, Cuff Size: Normal) Comment: palpated  Pulse 88   Resp 16   SpO2 95%  Weight yesterday-not taken Last visit weight-155lb  Home visit with Craig Schmidt this morning to do orthostatic BP.  Last night held his PM doses of Entresto 24-26, Bidil 1/2 tab, Carvedilol 6.'25mg'$ .    This morning Craig Schmidt says he feels good and is no longer feeling dizzy.  He is still mildly orthostatic this morning and has not yet taken his morning meds.  Contacted HF Triage and given instructios to "move 1/2 Spiro to p.m., add back 24-'26mg'$  Entresto, and 3.'125mg'$ . Carvedilol and hold 1/2 Bidil.  I will do a visit in the morning to repeat orthostatics and see how he's feeling.  Then I will reach out to HF Clinic for further medication changes if needed.  ACTION: Home visit completed  [name] [phone] 08/30/22  Patient Care Team: [name], NP as PCP - General (Nurse Practitioner) Janina Mayo, MD as PCP - Cardiology (Cardiology)  Patient Active Problem List   Diagnosis Date Noted   Chronic combined systolic and diastolic heart failure ([place])    ST elevation myocardial infarction (STEMI) of inferolateral wall, subsequent episode of care ([place]) 12/19/2021   ST elevation myocardial infarction (STEMI) ([place])    Ischemic cardiomyopathy    Cocaine abuse ([place])    Smoker    Alcohol abuse    Acute ischemic stroke ([place]) 09/08/2021   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[place]) 05/20/2015   Tear of MCL (medial collateral ligament) of knee  05/20/2015   Protein-calorie malnutrition, severe 05/17/2015   Lactic acidosis 05/15/2015   Left knee pain 05/15/2015   Lung nodules 05/15/2015   Hypoglycemia 05/14/2015   Hypothermia 05/14/2015   Acute encephalopathy 05/14/2015   Cocaine abuse with intoxication ([place]) 05/14/2015   Alcohol intoxication in active alcoholic ([place]) 77/82/4235   Sepsis ([place]) 05/14/2015   Homeless 05/14/2015    Current Outpatient Medications:    apixaban (ELIQUIS) 5 MG TABS tablet, Take 1 tablet (5 mg total) by mouth 2 (two) times daily., Disp: 60 tablet, Rfl: 5   atorvastatin (LIPITOR) 40 MG tablet, TAKE 1 TABLET(40 MG) BY MOUTH DAILY, Disp: 30 tablet, Rfl: 11   carvedilol (COREG) 6.25 MG tablet, Take 1 tablet (6.25 mg total) by mouth 2 (two) times daily., Disp: 180 tablet, Rfl: 3   clopidogrel (PLAVIX) 75 MG tablet, TAKE ONE TABLET BY MOUTH ONCE DAILY, Disp: 30 tablet, Rfl: 6   digoxin (LANOXIN) 0.125 MG tablet, TAKE ONE TABLET BY MOUTH ONCE DAILY, Disp: 30 tablet, Rfl: 6   famotidine (PEPCID) 20 MG tablet, TAKE ONE TABLET BY MOUTH ONCE DAILY, Disp: 30 tablet, Rfl: 6   Glycerin-Hypromellose-PEG 400 0.2-0.2-1 % SOLN, Place 1 drop into both eyes as needed., Disp: 15 mL, Rfl: 0   isosorbide-hydrALAZINE (BIDIL) 20-37.5 MG tablet, Take 0.5 tablets by mouth 3 (three) times daily. Take 1 & 1/2 tablets by mouth 3 times daily., Disp: 115 tablet, Rfl: 5   JARDIANCE 10  MG TABS tablet, TAKE ONE TABLET BY MOUTH ONCE DAILY, Disp: 30 tablet, Rfl: 6   lamoTRIgine (LAMICTAL) 25 MG tablet, Take 1 tablet (25 mg total) by mouth daily for 14 days. Then increase to 50 mg daily., Disp: 14 tablet, Rfl: 0   lamoTRIgine (LAMICTAL) 25 MG tablet, Take 2 tablets (50 mg total) by mouth daily for 14 days., Disp: 28 tablet, Rfl: 0   meloxicam (MOBIC) 7.5 MG tablet, TAKE ONE TABLET BY MOUTH ONCE DAILY, Disp: 30 tablet, Rfl: 1   potassium chloride SA (KLOR-CON M) 20 MEQ tablet, Take 1 tablet (20 mEq total) by mouth daily., Disp: 30 tablet, Rfl:  11   sacubitril-valsartan (ENTRESTO) 24-26 MG, Take 1 tablet by mouth 2 (two) times daily., Disp: 60 tablet, Rfl: 3   spironolactone (ALDACTONE) 25 MG tablet, Take 0.5 tablets (12.5 mg total) by mouth daily., Disp: 45 tablet, Rfl: 3   torsemide (DEMADEX) 20 MG tablet, Take 1 tablet (20 mg total) by mouth daily., Disp: 30 tablet, Rfl: 11   triamcinolone ointment (KENALOG) 0.5 %, APPLY TOPICALLY TO THE AFFECTED AREA TWICE DAILY, Disp: 60 g, Rfl: 2 Allergies  Allergen Reactions   Shellfish Allergy Anaphylaxis   Peanut-Containing Drug Products Itching     Social History   Socioeconomic History   Marital status: Significant Other    Spouse name: Not on file   Number of children: 7   Years of education: Not on file   Highest education level: 9th grade  Occupational History   Occupation: unemployed    Comment: experiencing homelessness-living at daily rent motels.  Tobacco Use   Smoking status: Every Day    Packs/day: 1.00    Years: 41.00    Total pack years: 41.00    Types: Cigarettes    Passive exposure: Current   Smokeless tobacco: Never  Vaping Use   Vaping Use: Never used  Substance and Sexual Activity   Alcohol use: Yes    Alcohol/week: 63.0 standard drinks of alcohol    Types: 63 Cans of beer per week    Comment: 3-40oz beer/day x10 years. 1-2 40oz beer/day in 20s/30s   Drug use: Yes    Types: "Crack" cocaine, Cocaine    Comment: last use 01/13/22   Sexual activity: Not on file  Other Topics Concern   Not on file  Social History Narrative   Not on file   Social Determinants of Health   Financial Resource Strain: High Risk (08/02/2022)   Overall Financial Resource Strain (CARDIA)    Difficulty of Paying Living Expenses: Very hard  Food Insecurity: Food Insecurity Present (08/02/2022)   Hunger Vital Sign    Worried About Running Out of Food in the Last Year: Sometimes true    Ran Out of Food in the Last Year: Sometimes true  Transportation Needs: Unmet  Transportation Needs (08/23/2022)   PRAPARE - Hydrologist (Medical): Yes    Lack of Transportation (Non-Medical): Yes  Physical Activity: Not on file  Stress: Not on file  Social Connections: Not on file  Intimate Partner Violence: Not on file    Physical Exam      Future Appointments  Date Time Provider [place]  09/11/2022  1:30 PM Alwyn Ren [facility] None  09/19/2022  4:00 PM [name], MD [facility] None  09/20/2022  1:30 PM [facility] PA/NP [facility] None  10/09/2022  2:20 PM [name], MD [facility] None

## 2022-08-30 NOTE — Telephone Encounter (Signed)
[facility] paramedic called and stated that the patients orthostatic blood pressures were   110/70-laying down HR 100 100/70-sitting HR 76 90 palpated-standing HR 88  Per Brittainy simmons, PA have patient hold bidil, take spironolactone at bedtime and decrease carvedilol to 1/2 tablet bid and check on patient tomorrow.  [facility] paramedic advised and verbalized understanding.

## 2022-08-30 NOTE — Telephone Encounter (Signed)
H&V Care Navigation CSW Progress Note  Clinical Social Worker consulted to help with bus passes due to pt transportation barriers- passes provided to paramedic who will take to patient.   SDOH Screenings   Food Insecurity: Food Insecurity Present (08/02/2022)  Housing: High Risk (01/01/2022)  Transportation Needs: Unmet Transportation Needs (08/23/2022)  Alcohol Screen: High Risk (09/19/2021)  Depression (PHQ2-9): High Risk (06/13/2022)  Financial Resource Strain: High Risk (08/02/2022)  Tobacco Use: High Risk (08/23/2022)    Jorge Ny, LCSW Clinical Social Worker [facility] Desk#: [phone] Cell#: [phone]

## 2022-08-31 ENCOUNTER — Telehealth (HOSPITAL_COMMUNITY): Payer: Self-pay

## 2022-08-31 ENCOUNTER — Other Ambulatory Visit (HOSPITAL_COMMUNITY): Payer: Self-pay | Admitting: Emergency Medicine

## 2022-08-31 NOTE — Progress Notes (Signed)
Paramedicine Encounter    Patient ID: Craig Schmidt, male    DOB: 1971-05-11, 52 y.o.   MRN: [id number]   Complaints None   Assessment A&O x 4, no chest pain, no SOB.  Lung sounds w/ some mild ronchi.  No edema noted. Weight stable.    Compliance with meds So far compliant 1 day at a time  Pill box filled x 1 week  Refills needed NONE  Meds changes since last visit D/C Bidil, decrease Carvedilol 3.125 BID, Arlyce Harman 1/2 tab. PM, Entresto 24-26 BID    Social changes NONE    Wt 146 lb 3.2 oz (66.3 kg)   SpO2 97%   BMI 22.90 kg/m  Weight yesterday-not taken Last visit weight-152lb  Pt. Seen daily for the past 4 days in a row to monitor BP due to + orthostatic changes w/ dizziness noted.   Orthostatic BP taken this morning prior to taking any medications.  He states he feels "fine".  He denies dizziness and is able to stand without issue.  Med box reconciled to reflect noted changes.  Advised pt to be compliant with all meds and left him a note posted on the wall with the times he is to take his daily doses.  Also discussed watching sodium intake and told him to stay away from [place] and advised he wood.  Will revisit him on Tuesday morning to keep check on compliance and blood pressures.   Provided him bus passes from [place].  ACTION: Home visit completed  [name] [id number] 08/31/22  Patient Care Team: [name], NP as PCP - General (Nurse Practitioner) Janina Mayo, MD as PCP - Cardiology (Cardiology)  Patient Active Problem List   Diagnosis Date Noted   Chronic combined systolic and diastolic heart failure ([place])    ST elevation myocardial infarction (STEMI) of inferolateral wall, subsequent episode of care ([place]) 12/19/2021   ST elevation myocardial infarction (STEMI) ([place])    Ischemic cardiomyopathy    Cocaine abuse ([place])    Smoker    Alcohol abuse    Acute ischemic stroke ([place]) 09/08/2021   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[place])  05/20/2015   Tear of MCL (medial collateral ligament) of knee 05/20/2015   Protein-calorie malnutrition, severe 05/17/2015   Lactic acidosis 05/15/2015   Left knee pain 05/15/2015   Lung nodules 05/15/2015   Hypoglycemia 05/14/2015   Hypothermia 05/14/2015   Acute encephalopathy 05/14/2015   Cocaine abuse with intoxication ([place]) 05/14/2015   Alcohol intoxication in active alcoholic ([place]) 123XX123   Sepsis ([place]) 05/14/2015   Homeless 05/14/2015    Current Outpatient Medications:    apixaban (ELIQUIS) 5 MG TABS tablet, Take 1 tablet (5 mg total) by mouth 2 (two) times daily., Disp: 60 tablet, Rfl: 5   atorvastatin (LIPITOR) 40 MG tablet, TAKE 1 TABLET(40 MG) BY MOUTH DAILY, Disp: 30 tablet, Rfl: 11   carvedilol (COREG) 6.25 MG tablet, Take 1 tablet (6.25 mg total) by mouth 2 (two) times daily., Disp: 180 tablet, Rfl: 3   clopidogrel (PLAVIX) 75 MG tablet, TAKE ONE TABLET BY MOUTH ONCE DAILY, Disp: 30 tablet, Rfl: 6   digoxin (LANOXIN) 0.125 MG tablet, TAKE ONE TABLET BY MOUTH ONCE DAILY, Disp: 30 tablet, Rfl: 6   famotidine (PEPCID) 20 MG tablet, TAKE ONE TABLET BY MOUTH ONCE DAILY, Disp: 30 tablet, Rfl: 6   Glycerin-Hypromellose-PEG 400 0.2-0.2-1 % SOLN, Place 1 drop into both eyes as needed., Disp: 15 mL, Rfl: 0   isosorbide-hydrALAZINE (BIDIL) 20-37.5  MG tablet, Take 0.5 tablets by mouth 3 (three) times daily. Take 1 & 1/2 tablets by mouth 3 times daily., Disp: 115 tablet, Rfl: 5   JARDIANCE 10 MG TABS tablet, TAKE ONE TABLET BY MOUTH ONCE DAILY, Disp: 30 tablet, Rfl: 6   lamoTRIgine (LAMICTAL) 25 MG tablet, Take 1 tablet (25 mg total) by mouth daily for 14 days. Then increase to 50 mg daily., Disp: 14 tablet, Rfl: 0   lamoTRIgine (LAMICTAL) 25 MG tablet, Take 2 tablets (50 mg total) by mouth daily for 14 days., Disp: 28 tablet, Rfl: 0   meloxicam (MOBIC) 7.5 MG tablet, TAKE ONE TABLET BY MOUTH ONCE DAILY, Disp: 30 tablet, Rfl: 1   potassium chloride SA (KLOR-CON M) 20 MEQ tablet, Take  1 tablet (20 mEq total) by mouth daily., Disp: 30 tablet, Rfl: 11   sacubitril-valsartan (ENTRESTO) 24-26 MG, Take 1 tablet by mouth 2 (two) times daily., Disp: 60 tablet, Rfl: 3   spironolactone (ALDACTONE) 25 MG tablet, Take 0.5 tablets (12.5 mg total) by mouth daily., Disp: 45 tablet, Rfl: 3   torsemide (DEMADEX) 20 MG tablet, Take 1 tablet (20 mg total) by mouth daily., Disp: 30 tablet, Rfl: 11   triamcinolone ointment (KENALOG) 0.5 %, APPLY TOPICALLY TO THE AFFECTED AREA TWICE DAILY, Disp: 60 g, Rfl: 2 Allergies  Allergen Reactions   Shellfish Allergy Anaphylaxis   Peanut-Containing Drug Products Itching     Social History   Socioeconomic History   Marital status: Significant Other    Spouse name: Not on file   Number of children: 7   Years of education: Not on file   Highest education level: 9th grade  Occupational History   Occupation: unemployed    Comment: experiencing homelessness-living at daily rent motels.  Tobacco Use   Smoking status: Every Day    Packs/day: 1.00    Years: 41.00    Total pack years: 41.00    Types: Cigarettes    Passive exposure: Current   Smokeless tobacco: Never  Vaping Use   Vaping Use: Never used  Substance and Sexual Activity   Alcohol use: Yes    Alcohol/week: 63.0 standard drinks of alcohol    Types: 63 Cans of beer per week    Comment: 3-40oz beer/day x10 years. 1-2 40oz beer/day in 20s/30s   Drug use: Yes    Types: "Crack" cocaine, Cocaine    Comment: last use 01/13/22   Sexual activity: Not on file  Other Topics Concern   Not on file  Social History Narrative   Not on file   Social Determinants of Health   Financial Resource Strain: High Risk (08/02/2022)   Overall Financial Resource Strain (CARDIA)    Difficulty of Paying Living Expenses: Very hard  Food Insecurity: Food Insecurity Present (08/02/2022)   Hunger Vital Sign    Worried About Running Out of Food in the Last Year: Sometimes true    Ran Out of Food in the Last  Year: Sometimes true  Transportation Needs: Unmet Transportation Needs (08/23/2022)   PRAPARE - Hydrologist (Medical): Yes    Lack of Transportation (Non-Medical): Yes  Physical Activity: Not on file  Stress: Not on file  Social Connections: Not on file  Intimate Partner Violence: Not on file    Physical Exam      Future Appointments  Date Time Provider [place]  09/11/2022  1:30 PM Alwyn Ren [facility] None  09/19/2022  4:00 PM [name], MD  [facility] None  09/20/2022  1:30 PM [facility] PA/NP [facility] None  10/09/2022  2:20 PM [name], MD [facility] None

## 2022-08-31 NOTE — Telephone Encounter (Signed)
[place] paramedic called to report patients blood pressure for the day.   Sitting 130/78 Standing 120/80 Supine 140/80  No dizziness  Per brittainy simmons, pa have patient follow the same regimen from yesterday (have patient hold bidil, take spironolactone at bedtime and decrease carvedilol to 1/2 tablet bid) [place] paramedic advised and verbalized understanding. and to call next week if his blood pressure rises. Dede advised and verbalized understanding.

## 2022-09-03 DIAGNOSIS — I213 ST elevation (STEMI) myocardial infarction of unspecified site: Secondary | ICD-10-CM | POA: Diagnosis not present

## 2022-09-03 DIAGNOSIS — I5043 Acute on chronic combined systolic (congestive) and diastolic (congestive) heart failure: Secondary | ICD-10-CM | POA: Diagnosis not present

## 2022-09-06 ENCOUNTER — Other Ambulatory Visit (HOSPITAL_COMMUNITY): Payer: Self-pay | Admitting: Emergency Medicine

## 2022-09-06 ENCOUNTER — Telehealth (HOSPITAL_COMMUNITY): Payer: Self-pay

## 2022-09-06 MED ORDER — CARVEDILOL 3.125 MG PO TABS: 3.1250 mg | ORAL_TABLET | Freq: Two times a day (BID) | ORAL | 5 refills | Status: DC

## 2022-09-06 MED ORDER — SPIRONOLACTONE 25 MG PO TABS: 12.5000 mg | ORAL_TABLET | Freq: Every day | ORAL | 3 refills | Status: DC

## 2022-09-06 NOTE — Telephone Encounter (Signed)
Craig Schmidt paramedic called to report patients blood pressure for the day.    Sitting 130/78 Standing 120/80 Supine 140/80   No dizziness   Per brittainy simmons, pa have patient follow the same regimen from yesterday (have patient hold bidil, take spironolactone at bedtime and decrease carvedilol to 1/2 tablet bid) Craig Schmidt paramedic advised and verbalized understanding. and to call next week if his blood pressure rises. Craig Schmidt advised and verbalized understanding.

## 2022-09-07 ENCOUNTER — Telehealth (HOSPITAL_COMMUNITY): Payer: Self-pay | Admitting: Licensed Clinical Social Worker

## 2022-09-07 ENCOUNTER — Ambulatory Visit: Payer: Medicaid Other | Admitting: Licensed Clinical Social Worker

## 2022-09-07 NOTE — Telephone Encounter (Signed)
H&V Care Navigation CSW Progress Note  Clinical Social Worker received call from pt trying to find Clinical biochemist.  CSW informed pt paramedic is off today and inquired what his concerns are.  States that he is having trouble at his camp location and being told he has to move- was hoping paramedic could assist in speaking to the shop owner.  CSW informed pt that he would have to handle this situation directly with the shop owner as it was a personal matter and we have not medical or legal basis to advocate for him in this situation.  Pt then stated he is having trouble with his tent being too wet to sleep in.  States he has a second dry tent but no dry belongings to put in it.  CSW provided patient with extra jacket and socks as well as assisted with means to go dry his bedding.    Provided with bus passes to help get to upcoming appts.  Patient is participating in a Managed Medicaid Plan:  Yes  [place]: Food Insecurity Present (08/02/2022)  Housing: High Risk (01/01/2022)  Transportation Needs: Unmet Transportation Needs (09/07/2022)  Alcohol Screen: High Risk (09/19/2021)  Depression (PHQ2-9): High Risk (06/13/2022)  Financial Resource Strain: High Risk (09/07/2022)  Tobacco Use: High Risk (08/23/2022)    Jorge Ny, LCSW Clinical Social Worker [facility] Desk#: [phone] Cell#: [phone]
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
## 2022-09-10 ENCOUNTER — Other Ambulatory Visit: Payer: Medicaid Other | Admitting: Licensed Clinical Social Worker

## 2022-09-10 DIAGNOSIS — Z5900 Homelessness unspecified: Secondary | ICD-10-CM

## 2022-09-10 DIAGNOSIS — F10220 Alcohol dependence with intoxication, uncomplicated: Secondary | ICD-10-CM

## 2022-09-10 NOTE — Patient Outreach (Signed)
Medicaid Managed Care Social Work Note  09/10/2022 Name:  Craig Schmidt MRN:  [id number] DOB:  [birth year]  Craig Schmidt is an 52 y.o. year old male who is a primary patient of Craig Herter, Schmidt.  The Medicaid Managed Care Coordination team was consulted for assistance with:  Craig Schmidt and Resources  Craig Schmidt was given information about Medicaid Managed Care Coordination team services today. Craig Schmidt Patient agreed to services and verbal consent obtained.  Engaged with patient  for by telephone forinitial visit in response to referral for case management and/or care coordination services.   Assessments/Interventions:  Review of past medical history, allergies, medications, health status, including review of consultants reports, laboratory and other test data, was performed as part of comprehensive evaluation and provision of chronic care management services.  SDOH: (Social Determinant of Health) assessments and interventions performed: SDOH Interventions    Flowsheet Row Patient Outreach Telephone from 09/10/2022 in [place] Telephone from 09/07/2022 in [place] and [place] Telephone from 08/30/2022 in [place] and [place] Telephone from 08/22/2022 in [place] and [place] Telephone from 08/02/2022 in [place] and [place] [facility] from 07/10/2022 in [place] and [place]  SDOH Interventions        Food Insecurity Interventions -- -- -- -- Other (Comment)  [petty cash] --  Transportation Interventions -- Whole Foods Given Whole Foods Given Contracted Vendor, Whole Foods Given -- Whole Foods Given  Financial Strain Interventions -- Other (Comment)  [gave jacket to help keep warm and dry] -- -- Other (Comment)  [patient care fund] --  Stress Interventions [employer], Provide Counseling -- -- -- -- --       Advanced Directives Status:  See Care Plan for related entries.  Care Plan                 Allergies  Allergen Reactions   Shellfish Allergy Anaphylaxis   Peanut-Containing Drug Products Itching    Medications Reviewed Today     Reviewed by Craig Schmidt, Paramedic (Certified Medical Assistant) on 09/06/22 at 1225  Med List Status: <None>   Medication Order Taking? Sig Documenting Provider Last Dose Status Informant  apixaban (ELIQUIS) 5 MG TABS tablet [id number] Yes Take 1 tablet (5 mg total) by mouth 2 (two) times daily. Craig Grinder Craig Schmidt Taking Active Self  atorvastatin (LIPITOR) 40 MG tablet [id number] Yes TAKE 1 TABLET(40 MG) BY MOUTH DAILY Craig Dresser, MD Taking Active   carvedilol (COREG) 3.125 MG tablet [id number] Yes Take 1 tablet (3.125 mg total) by mouth 2 (two) times daily. Craig Jester M, [name] Taking Active   clopidogrel (PLAVIX) 75 MG tablet [id number] Yes TAKE ONE TABLET BY MOUTH ONCE DAILY Clegg, Craig Craig Schmidt Taking Active   digoxin (LANOXIN) 0.125 MG tablet [id number] Yes TAKE ONE TABLET BY MOUTH ONCE DAILY Clegg, Craig Craig Schmidt Taking Active   famotidine (PEPCID) 20 MG tablet [id number] Yes TAKE ONE TABLET BY MOUTH ONCE DAILY Clegg, Craig Craig Schmidt Taking Active   Glycerin-Hypromellose-PEG 400 0.2-0.2-1 % SOLN [id number] Yes Place 1 drop into both eyes as needed. Clegg, Craig Craig Schmidt Taking Active Self  isosorbide-hydrALAZINE (BIDIL) 20-37.5 MG tablet [id number] No Take 0.5 tablets by mouth 3 (three) times daily. Take 1 & 1/2 tablets by mouth 3 times daily.  Patient not taking: Reported on  09/06/2022   Craig Bihari, [name] Not Taking Active   JARDIANCE 10 MG TABS tablet [id number] Yes TAKE ONE TABLET BY MOUTH ONCE DAILY Clegg, Craig Craig Schmidt Taking Active   lamoTRIgine (LAMICTAL) 25 MG tablet [id number]  Take 1 tablet (25 mg total) by mouth daily for 14 days. Then increase to 50 mg daily. Craig Schlatter, MD  Expired  07/10/22 2359   lamoTRIgine (LAMICTAL) 25 MG tablet [id number]  Take 2 tablets (50 mg total) by mouth daily for 14 days. Craig Schlatter, MD  Expired 07/12/22 2359   meloxicam (MOBIC) 7.5 MG tablet [id number] Yes TAKE ONE TABLET BY MOUTH ONCE DAILY Craig Dix, Craig Bo, [name] Taking Active   potassium chloride SA (KLOR-CON Schmidt) 20 MEQ tablet [id number] Yes Take 1 tablet (20 mEq total) by mouth daily. Lyndon, Craig Bo, [name] Taking Active   sacubitril-valsartan (ENTRESTO) 24-26 MG [id number] Yes Take 1 tablet by mouth 2 (two) times daily. [place], Craig Bo, [name] Taking Active   spironolactone (ALDACTONE) 25 MG tablet [id number] Yes Take 0.5 tablets (12.5 mg total) by mouth at bedtime. Craig Jester M, [name] Taking Active   torsemide (DEMADEX) 20 MG tablet [id number] Yes Take 1 tablet (20 mg total) by mouth daily. Craig Schmidt, [place] Taking Active   triamcinolone ointment (KENALOG) 0.5 % [id number] Yes APPLY TOPICALLY TO THE AFFECTED AREA TWICE DAILY Craig Herter, Schmidt Taking Active             Patient Active Problem List   Diagnosis Date Noted   Chronic combined systolic and diastolic heart failure (HCC)    ST elevation myocardial infarction (STEMI) of inferolateral wall, subsequent episode of care ([place]) 12/19/2021   ST elevation myocardial infarction (STEMI) (HCC)    Ischemic cardiomyopathy    Cocaine abuse ([place])    Smoker    Alcohol abuse    Acute ischemic stroke ([place]) 09/08/2021   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   Tear of MCL (medial collateral ligament) of knee 05/20/2015   Protein-calorie malnutrition, severe 05/17/2015   Lactic acidosis 05/15/2015   Left knee pain 05/15/2015   Lung nodules 05/15/2015   Hypoglycemia 05/14/2015   Hypothermia 05/14/2015   Acute encephalopathy 05/14/2015   Cocaine abuse with intoxication ([place]) 05/14/2015   Alcohol intoxication in active alcoholic ([place]) [id number]   Sepsis ([place]) 05/14/2015   Homeless 05/14/2015    Conditions to  be addressed/monitored per PCP order:   Substance Abuse  Care Plan : LCSW plan of care  Updates made by Craig Cutter, LCSW since 09/10/2022 12:00 AM     Problem: Addictive Behavior      Long-Range Goal: Addictive Behavior Managed   Start Date: 09/10/2022  Note:   Timeframe:  Long-Range Goal Priority:  Medium Start Date:  09/10/22 at                    Expected End Date: ongoing                 Follow Up Date- 09/28/22 at 1;15 pm   - avoid negative self-talk - be honest with myself - begin personal counseling - check out recovery housing - exercise at least 2 to 3 times per week - identify a recovery coach - identify triggers for relapse - join AA (Alcoholics Anonymous) or NA (Narcotics Anonymous) - learn how to handle negative feelings without drugs or alcohol - make a list of pleasurable things to do without using alcohol or drugs - make a plan to  deal with triggers like holidays and anniversaries - make a plan to fix broken relationships - make a plan to handle bad days - make a written relapse plan - return to AA (Alcoholics Anonymous) or NA (Narcotics Anonymous) - spend time or talk with a recovery coach or support person at least 2 or 3 times every week - spend time or talk with people who do not drink or use every day    Why is this important?   It is possible that after you stop drinking or using drugs that you may slip and use again. This is called a relapse.  A relapse does not mean that you have failed. It means you have had one or a few bad days.  Make sure you know why it is important for you to stay sober and remind yourself often.  Once you figure out what triggered the relapse and take the steps to deal with it, you can be even more hopeful that recovery is possible.       - avoid negative self-talk - develop a personal safety plan - develop a plan to deal with triggers like holidays, anniversaries - exercise at least 2 to 3 times per week - have a plan  for how to handle bad days - journal feelings and what helps to feel better or worse - spend time or talk with others at least 2 to 3 times per week - spend time or talk with others every day - watch for early signs of feeling worse - write in journal every day    Why is this important?   Keeping track of your progress will help your treatment team find the right mix of medicine and therapy for you.  Write in your journal every day.  Day-to-day changes in depression symptoms are normal. It may be more helpful to check your progress at the end of each week instead of every day.       Current barriers:   Chronic Mental Health needs related to Depression and Alcohol/Substance Abuse 30 plus years of homelessness, patient lives in a tent Low support network Currently unable to independently self manage needs related to mental health conditions. Knowledge Deficits related to short term plan for care coordination  needs and long term plans for chronic disease management Lacks knowledge of where and how to connect for mental health support Needs Support, Education, and Care Coordination in order to meet unmet need.   Clinical Goal(s): Over the next 120 days, patient will work with SW to reduce or manage symptoms of cravings and anxiety/depression until connected for ongoing counseling or finds medication that helps his symptom relief.   Clinical Interventions:  Assessed patient's previous treatment, needs, coping skills, current treatment, support system and barriers to care Patient interviewed and appropriate assessments performed or reviewed: brief mental health assessment;Suicidal Ideation/Homicidal Ideation: No Provided basic mental health support, education and interventions  Patient reports that he is has nursing and social work needs. He reports that he is looking for stable housing, transportation resources, dental resources, mental health resources and nursing case management for CHF due  to recent hospitalization.  Relapse prevention education provided to patient on 09/10/22 Discussed several options for long term counseling based on need and insurance.  Reviewed mental health medications with patient prescribed by PCP and discussed compliance  [facility] LCSW educated patient on medication therapy that can help with alcohol triggers. Other interventions include: Motivational Interviewing Solution-Focused Strategies Mindfulness or Insurance risk surveyor  care team collaboration (see longitudinal plan of care) [facility] LCSW educated patient on healthy coping skills to implement into is daily routine to combat alcohol substance cravings and anxiety and depressive symptoms once they arise. [facility] LCSW made referral to [facility] and BSW on 09/10/22. Referral made to [facility] for individual counseling on 09/10/22 with patient's consent. He reports already being active with a psychiatrist.   Referral from PCP states-Needs as much support as is available. Chronically homeless with wife and dog- currently in hotel being paid for through [facility] and supposedly working with them to assess for other options but nothing yet. Has SSI income. Has chronic mental health and competency concerns which make him difficult to work with somewhat. Has issues with transport to appts due to lack of stable address so hard to set up medicaid transport. Needs chronic case management to help reduce risk of admission is a high ED utilizer. Being seen by our Community Paramedic at this time but he will likely not be able to remain on program for much longer as he is not [facility] so want to help find support for him following DC.    Patient Goals/Self-Care Activities: Over the next 120 days Implement interventions discussed today to decreases alcohol usage, symptoms of anxiety and depression and increase knowledge and/or ability of: coping skills, healthy habits, self-management skills, and stress reduction.   Social History    Substance and Sexual Activity  Alcohol Use Yes   Alcohol/week: 6.0 standard drinks   Types: 6 Cans of beer per week   Comment: beer  Alcohol Use Education Information about Your Drinking Your score on the Alcohol Use Disorders Identification Test was: AUDIT C:    TOTAL AUDIT SCORE:   .  This score places you in the category of:  Score 0 = Abstainers Score 8-19 = Unhealthy/High Risk Drinkers  Score 1-7 = Low Risk Drinkers Score 20+ = Probable Alcohol Dependence  High Scores (20+) on the Alcohol Use Identification Test Consider becoming involved in a structured program.  You should stop drinking if: You have tried to cut down before but have not been successful, or  You suffer from morning shakes during a heavy drinking period, or You have high blood pressure, or You are pregnant, or You have liver disease, or You are taking medicines that react with alcohol, or Your alcohol use is affecting your social relationships, or You have legal consequences like DUIs, or You call in sick to work, or You cannot take care of our children, or Someone close to you says you drink too much   How Much Alcohol is a Drink: Beer: 12 oz. = 1 drink 16 oz. = 1.3 drinks 22 oz. = 2 drinks 40 oz. = 3.3 drinks  Wine: 5 oz. = 1 drink 740 mL (25 oz.) bottle = 5 drinks Malt Liquor: 12 oz. = 1.5 drinks 16 oz. = 2 drinks 22 oz. = 2.5 drinks 40 oz. = 4.5 drinks  80-Proof Spirits - Hard Liquor: 1 shot = 1 drink 1 mixed drink = number of shots Can equal 1-3 drinks  What is Low-risk Drinking? Have no more than 2 drinks of alcohol per day Drink no more than 5 days per week Do not drink alcohol drink alcohol when: You drive or operate machinery You are pregnant or breast feeding You are taking medications that interact with alcohol You have medical conditions made worse with alcohol You can stop or control your drinking      Identify  Your Triggers for Drinking Parties Particular  People Feeling lonely Feeling tense Family problems Feeling sad Feeling happy Feeling bored After work Problems sleeping Criticism Feelings of failure After being paid When others are drinking In bars When out for dinner After arguments Weekends Feeling restless Being in pain   Effects of High-Risk Drinking To the Brain: Aggressive, irrational behavior Arguments, violence Depression, nervousness Alcohol dependence, memory loss To the Nervous System: Trembling hands, tingling fingers Numbness, painful nerves Impaired sensation leading to falls Numb tingling toes To Your Lifestyle: Social, legal, medical problems Domestic trouble/relationship loss Job loss & financial problems Shortened life span Accidents and death from drunk driving   To the Face: Premature aging, drinker's nose Cancer of the throat & mouth To the Body: Frequent cold Reduced resistance to infection Increased risk of pneumonia Weakness of heart muscle Heart failure, anemia Impaired blood clotting Breast cancer Vitamin deficiency, bleeding Severe Inflammation of the stomach Vomiting, diarrhea, malnutrition Ulcer, inflammation of the pancreas Impaired sexual performance Birth defects, including deformities, retardation, and low birthweight  Ways to Cope Without Drinking Go home if you tend to drink after work Find another activity Switch to nonalcoholic beverages Change friends Join a Financial planner Visit relatives Plan/take a trip Go for a walk Take up a hobby Listen to music Talk to a friend Reading What would you do if you had no worries about failing?        Good Reasons for Drinking Less I will live longer - probably 8-10 years. I will sleep better. I will be happier. I will save a lot of money My relationships will improve. I will stay younger for longer. I will achieve more in my life There will be a greater chance that I will survive to a healthy old age with no  premature damage to my brain.  I will be better at my job. I will be less likely to feel depressed and commit suicide (6 times less likely). I will be less likely to die of heart disease or cancer. Other people will respect me I will be less likely to get into trouble with the police. The possibility that I will die of liver disease will be dramatically reduced (12 times less likely). It will be less likely that I will die in a car accident (3 times less likely).   Strategies for Cutting Down Keep Track.  Find a way to keep track of how much you drink.  If you make a note of each drink before you drink it, this will help slow you down. Count and Measure.  Know the standard drink sizes.  Ask the bartender or server about the amount of alcohol in a mixed drink. Set Goals.  Decide how many days a week you will drink and how many drinks each day. Pace and Space.  When you do drink, pace yourself.  Have no more than one drink with alcohol per hour.  Alternate "drink spacers" non-alcoholic drinks such as water, soda, or juice with drinks containing alcohol. Include Food.  Don't drink on an empty stomach.  Have some food so the alcohol will be absorbed more slowly into your system.  Avoid Triggers.  Avoid people, places, or activities that have led to drinking in the past.  Certain times of day or feelings may also be triggers.  Make a plan so you will know what you can do instead of drinking. Plan to Handle Urges.  When an urge hits, consider these options:  Remind yourself  of your reasons for changing.  Or talk it through with someone you trust. Or get involved with a healthy, distracting activity.  Or, "urge surf" - instead of fighting the feeling, accept it and ride it out, knowing it will soon crest like a wave and pass. Know Your "No".  Have a polite, convincing "no thanks" for those times when you may be offered a drink and don't want one.  The faster you can say no to these offers, the less likely  you are to give in.  If you hesitate, it allows you time to think of excuses to go along.       10 LITTLE Things To Do When You're Feeling Too Down To Do Anything  Take a shower. Even if you plan to stay in all day long and not see a soul, take a shower. It takes the most effort to hop in to the shower but once you do, you'll feel immediate results. It will wake you up and you'll be feeling much fresher (and cleaner too).  Brush and floss your teeth. Give your teeth a good brushing with a floss finish. It's a small task but it feels so good and you can check 'taking care of your health' off the list of things to do.  Do something small on your list. Most of [place] have some small thing we would like to get done (load of laundry, sew a button, email a friend). Doing one of these things will make you feel like you've accomplished something.  Drink water. Drinking water is easy right? It's also really beneficial for your health so keep a glass beside you all day and take sips often. It gives you energy and prevents you from boredom eating.  Do some floor exercises. The last thing you want to do is exercise but it might be just the thing you need the most. Keep it simple and do exercises that involve sitting or laying on the floor. Even the smallest of exercises release chemicals in the brain that make you feel good. Yoga stretches or core exercises are going to make you feel good with minimal effort.  Make your bed. Making your bed takes a few minutes but it's productive and you'll feel relieved when it's done. An unmade bed is a huge visual reminder that you're having an unproductive day. Do it and consider it your housework for the day.  Put on some nice clothes. Take the sweatpants off even if you don't plan to go anywhere. Put on clothes that make you feel good. Take a look in the mirror so your brain recognizes the sweatpants have been replaced with clothes that make you look great. It's an  instant confidence booster.  Wash the dishes. A pile of dirty dishes in the sink is a reflection of your mood. It's possible that if you wash up the dishes, your mood will follow suit. It's worth a try.  Cook a real meal. If you have the luxury to have a "do nothing" day, you have time to make a real meal for yourself. Make a meal that you love to eat. The process is good to get you out of the funk and the food will ensure you have more energy for tomorrow.  Write out your thoughts by hand. When you hand write, you stimulate your brain to focus on the moment that you're in so make yourself comfortable and write whatever comes into your mind. Put those thoughts out on paper  so they stop spinning around in your head. Those thoughts might be the very thing holding you down.  Initial Goal       24- Hour Availability:    [facility]  [place], [place] [facility] [phone]   [facility] [employer] [place]  [phone]    [place]  [phone] (after hours)   Therapeutic Alternative/Mobile Crisis   [phone]   [place] National Suicide Hotline  [phone] [place]ina Monks) [place] 988   Call 911 or go to emergency room   [facility]  [phone]);  [place] and [employer]  [phone]); [place], [place], [place], [place], [place], [place], [place]          Problem: Relapse Risk      Goal: Relapse Risk Minimized   Note:   Evidence-based guidance:  Intensify follow-up during periods when at a high risk of relapsing including the early stages of treatment, times of transition to less intensive levels of care, as well as the first year after active treatment has been completed.  Coordinate care with alcohol or substance misuse treatment services.  Support and reinforce coping skills.  Develop a relapse prevention plan  that includes anticipating and avoiding temptation, changing lifestyle to reduce stress, coping with cravings and urges to use, developing social support system.   Suggest or implement therapy, including acupuncture, mindfulness, motivational interviewing, social skills training, coping skills training and lifestyle changes.  Encourage involvement in vocational, social or religious activities, as well as in [place] (Alcoholics Anonymous) or similar group meetings.  Provide advocacy, support and help with resources as needed.  Provide anticipatory guidance to patient and significant other or support network about the identification and management of relapse triggers, such as stress, negative thought or emotion, insomnia and major life changes.  Monitor efficacy of pharmacologic therapy that may include opiate antagonist, alcohol deterrent, GABAB agonist, selective GABAB receptor agonist, 5-HT3 receptor agonist or antiepileptic.  Address side effects of pharmacologic therapy and monitor liver function.  Repeatedly and respectfully encourage reluctant patients to accept treatment of some kind; contingency management (counseling plus monetary or voucher reward for abstinence) may lead to following through with treatment.   Notes:      Problem: Unidentified Substance Misuse      Goal: Substance Misuse Identified   Note:   Evidence-based guidance:  Build trusting relationship, engaging with patient and family/caregiver in a nonjudgmental, empathic manner; encourage collaboration with the treatment team; ensure that discussions take place in a private setting.  Discuss use of alcohol and other substances in the context of the overall assessment and related to the presenting problem, with the goal of health promotion and a complete understanding of healthy behavior.  Administer or review standardized and validated tools to screen for the presence of substance misuse; consider use of paper form or electronic  device to assure privacy, autonomy and patient engagement.  Discuss results of standardized screening in a nonjudgmental manner; consider potential for patient minimization of the problem related to stigma and discrimination.  Ask clarifying questions about the quantity and frequency of use; include drinking, medication use (prescription, over-the-counter), inhalant and illicit drug use.  Assess for minor withdrawal symptoms such as tremors, disorientation, tachycardia, irritability, anxiety, insomnia or diaphoresis.  Prepare patient for potential urine toxicology, drug screen or blood alcohol level.   Notes:       Follow up:  Patient  agrees to Care Plan and Follow-up.  Plan: The Managed Medicaid care management team will reach out to the patient again over the next 30 days.  Date/time of next scheduled Social Work care management/care coordination outreach:  09/28/22 at [place], [place], MSW, [place] Medicaid LCSW [email or web address] Phone: [phone]

## 2022-09-10 NOTE — Patient Instructions (Signed)
Visit Information  Craig Schmidt was given information about Medicaid Managed Care team care coordination services as a part of their Healthy [facility] Medicaid benefit. Craig Schmidt verbally consented to engagement with the [facility] Managed Care team.   If you are experiencing a medical emergency, please call 911 or report to your local emergency department or urgent care.   If you have a non-emergency medical problem during routine business hours, please contact your provider's office and ask to speak with a nurse.   For questions related to your Healthy [facility] health plan, please call: [phone] or visit the homepage here: [email or web address]  If you would like to schedule transportation through your Healthy [facility] plan, please call the following number at least 2 days in advance of your appointment: [phone]  For information about your ride after you set it up, call Ride Assist at [phone]. Use this number to activate a Will Call pickup, or if your transportation is late for a scheduled pickup. Use this number, too, if you need to make a change or cancel a previously scheduled reservation.  If you need transportation services right away, call [phone]. The after-hours call center is staffed 24 hours to handle ride assistance and urgent reservation requests (including discharges) 365 days a year. Urgent trips include sick visits, hospital discharge requests and life-sustaining treatment.  Call the [employer] at [phone], at any time, 24 hours a day, 7 days a week. If you are in danger or need immediate medical attention call 911.  If you would like help to quit smoking, call 1-800-QUIT-NOW (534)746-2964) OR Espaol: 1-855-Djelo-Ya HD:1601594) o para ms informacin haga clic aqu or Text READY to 200-400 to register via text

## 2022-09-11 ENCOUNTER — Ambulatory Visit (HOSPITAL_COMMUNITY): Payer: Medicaid Other | Admitting: Mental Health

## 2022-09-12 ENCOUNTER — Inpatient Hospital Stay: Payer: Medicaid Other | Admitting: Neurology

## 2022-09-12 ENCOUNTER — Other Ambulatory Visit (HOSPITAL_COMMUNITY): Payer: Self-pay | Admitting: Emergency Medicine

## 2022-09-12 NOTE — Progress Notes (Signed)
Paramedicine Encounter    Patient ID: Craig Schmidt, male    DOB: Aug 31, 1970, 52 y.o.   MRN: [id number]   Complaints***  Assessment***  Compliance with meds***  Pill box filled***  Refills needed***  Meds changes since last visit***    Social changes***   There were no vitals taken for this visit. Weight yesterday-*** Last visit weight-***  ACTION: {Paramed Action:[phone]}  [name], [place] 09/12/22  Patient Care Team: [name], NP as PCP - General (Nurse Practitioner) Janina Mayo, MD as PCP - Cardiology (Cardiology)  Patient Active Problem List   Diagnosis Date Noted  . Chronic combined systolic and diastolic heart failure ([place])   . ST elevation myocardial infarction (STEMI) of inferolateral wall, subsequent episode of care ([place]) 12/19/2021  . ST elevation myocardial infarction (STEMI) ([place])   . Ischemic cardiomyopathy   . Cocaine abuse ([place])   . Smoker   . Alcohol abuse   . Acute ischemic stroke ([place]) 09/08/2021  . 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[place]) 05/20/2015  . Tear of MCL (medial collateral ligament) of knee 05/20/2015  . Protein-calorie malnutrition, severe 05/17/2015  . Lactic acidosis 05/15/2015  . Left knee pain 05/15/2015  . Lung nodules 05/15/2015  . Hypoglycemia 05/14/2015  . Hypothermia 05/14/2015  . Acute encephalopathy 05/14/2015  . Cocaine abuse with intoxication ([place]) 05/14/2015  . Alcohol intoxication in active alcoholic ([place]) [id number]  . Sepsis ([place]) 05/14/2015  . Homeless 05/14/2015    Current Outpatient Medications:  .  apixaban (ELIQUIS) 5 MG TABS tablet, Take 1 tablet (5 mg total) by mouth 2 (two) times daily., Disp: 60 tablet, Rfl: 5 .  atorvastatin (LIPITOR) 40 MG tablet, TAKE 1 TABLET(40 MG) BY MOUTH DAILY, Disp: 30 tablet, Rfl: 11 .  carvedilol (COREG) 3.125 MG tablet, Take 1 tablet (3.125 mg total) by mouth 2 (two) times daily., Disp: 60 tablet, Rfl: 5 .  clopidogrel (PLAVIX) 75 MG  tablet, TAKE ONE TABLET BY MOUTH ONCE DAILY, Disp: 30 tablet, Rfl: 6 .  digoxin (LANOXIN) 0.125 MG tablet, TAKE ONE TABLET BY MOUTH ONCE DAILY, Disp: 30 tablet, Rfl: 6 .  famotidine (PEPCID) 20 MG tablet, TAKE ONE TABLET BY MOUTH ONCE DAILY, Disp: 30 tablet, Rfl: 6 .  Glycerin-Hypromellose-PEG 400 0.2-0.2-1 % SOLN, Place 1 drop into both eyes as needed., Disp: 15 mL, Rfl: 0 .  isosorbide-hydrALAZINE (BIDIL) 20-37.5 MG tablet, Take 0.5 tablets by mouth 3 (three) times daily. Take 1 & 1/2 tablets by mouth 3 times daily. (Patient not taking: Reported on 09/06/2022), Disp: 115 tablet, Rfl: 5 .  JARDIANCE 10 MG TABS tablet, TAKE ONE TABLET BY MOUTH ONCE DAILY, Disp: 30 tablet, Rfl: 6 .  lamoTRIgine (LAMICTAL) 25 MG tablet, Take 1 tablet (25 mg total) by mouth daily for 14 days. Then increase to 50 mg daily., Disp: 14 tablet, Rfl: 0 .  lamoTRIgine (LAMICTAL) 25 MG tablet, Take 2 tablets (50 mg total) by mouth daily for 14 days., Disp: 28 tablet, Rfl: 0 .  meloxicam (MOBIC) 7.5 MG tablet, TAKE ONE TABLET BY MOUTH ONCE DAILY, Disp: 30 tablet, Rfl: 1 .  potassium chloride SA (KLOR-CON M) 20 MEQ tablet, Take 1 tablet (20 mEq total) by mouth daily., Disp: 30 tablet, Rfl: 11 .  sacubitril-valsartan (ENTRESTO) 24-26 MG, Take 1 tablet by mouth 2 (two) times daily., Disp: 60 tablet, Rfl: 3 .  spironolactone (ALDACTONE) 25 MG tablet, Take 0.5 tablets (12.5 mg total) by mouth at bedtime., Disp: 45 tablet, Rfl: 3 .  torsemide (  DEMADEX) 20 MG tablet, Take 1 tablet (20 mg total) by mouth daily., Disp: 30 tablet, Rfl: 11 .  triamcinolone ointment (KENALOG) 0.5 %, APPLY TOPICALLY TO THE AFFECTED AREA TWICE DAILY, Disp: 60 g, Rfl: 2 Allergies  Allergen Reactions  . Shellfish Allergy Anaphylaxis  . Peanut-Containing Drug Products Itching     Social History   Socioeconomic History  . Marital status: Significant Other    Spouse name: Not on file  . Number of children: 7  . Years of education: Not on file  . Highest  education level: 9th grade  Occupational History  . Occupation: unemployed    Comment: experiencing homelessness-living at daily rent motels.  Tobacco Use  . Smoking status: Every Day    Packs/day: 1.00    Years: 41.00    Total pack years: 41.00    Types: Cigarettes    Passive exposure: Current  . Smokeless tobacco: Never  Vaping Use  . Vaping Use: Never used  Substance and Sexual Activity  . Alcohol use: Yes    Alcohol/week: 63.0 standard drinks of alcohol    Types: 63 Cans of beer per week    Comment: 3-40oz beer/day x10 years. 1-2 40oz beer/day in 20s/30s  . Drug use: Yes    Types: "Crack" cocaine, Cocaine    Comment: last use 01/13/22  . Sexual activity: Not on file  Other Topics Concern  . Not on file  Social History Narrative  . Not on file   Social Determinants of Health   Financial Resource Strain: High Risk (09/07/2022)   Overall Financial Resource Strain (CARDIA)   . Difficulty of Paying Living Expenses: Very hard  Food Insecurity: Food Insecurity Present (08/02/2022)   Hunger Vital Sign   . Worried About Charity fundraiser in the Last Year: Sometimes true   . Ran Out of Food in the Last Year: Sometimes true  Transportation Needs: Unmet Transportation Needs (09/07/2022)   PRAPARE - Transportation   . Lack of Transportation (Medical): Yes   . Lack of Transportation (Non-Medical): Yes  Physical Activity: Not on file  Stress: Stress Concern Present (09/10/2022)   [place]   . Feeling of Stress : Rather much  Social Connections: Not on file  Intimate Partner Violence: Not on file    Physical Exam      Future Appointments  Date Time Provider [place]  09/19/2022  4:00 PM [name], MD [facility] None  09/20/2022  1:30 PM [facility] PA/NP [facility] None  09/27/2022 11:00 AM Mickel Fuchs J [facility] None  09/27/2022  1:00 PM Melissa Montane, RN [place] None  09/28/2022  1:15 PM Greg Cutter, LCSW [facility] None  10/09/2022  2:20 PM [name], MD [facility] None  10/17/2022  1:00 PM Alwyn Ren, [facility] None

## 2022-09-19 ENCOUNTER — Encounter (HOSPITAL_COMMUNITY): Payer: Medicaid Other | Admitting: Student

## 2022-09-20 ENCOUNTER — Inpatient Hospital Stay (HOSPITAL_COMMUNITY): Payer: Medicaid Other

## 2022-09-21 ENCOUNTER — Ambulatory Visit: Payer: Medicaid Other | Admitting: Licensed Clinical Social Worker

## 2022-09-25 ENCOUNTER — Telehealth (HOSPITAL_COMMUNITY): Payer: Self-pay | Admitting: Emergency Medicine

## 2022-09-25 NOTE — Telephone Encounter (Signed)
Called @ 10:49 & 16:51 today left voice mail for him to return my call.  Also drove by where he stays and no one was there.    [name], [place] 09/25/2022

## 2022-09-26 ENCOUNTER — Telehealth (HOSPITAL_COMMUNITY): Payer: Self-pay | Admitting: Licensed Clinical Social Worker

## 2022-09-26 NOTE — Telephone Encounter (Signed)
H&V Care Navigation CSW Progress Note  Clinical Social Worker spoke to pt case worker with [facility] regarding current status with their program.  Report they lost pt due to lack of contact- CSW provided with updated number.  They will reach out to pt to reassess and see if he can get referred back to the [facility] program that he was DC'd from for lack of contact as well.  Patient is participating in a Managed Medicaid Plan:  Yes  [place]: Food Insecurity Present (08/02/2022)  Housing: Medium Risk (09/26/2022)  Transportation Needs: Unmet Transportation Needs (09/07/2022)  Alcohol Screen: High Risk (09/19/2021)  Depression (PHQ2-9): High Risk (06/13/2022)  Financial Resource Strain: High Risk (09/07/2022)  Stress: Stress Concern Present (09/10/2022)  Tobacco Use: High Risk (08/23/2022)    Jorge Ny, LCSW Clinical Social Worker [facility] Desk#: [phone] Cell#: [phone]

## 2022-09-27 ENCOUNTER — Other Ambulatory Visit: Payer: Medicaid Other | Admitting: *Deleted

## 2022-09-27 ENCOUNTER — Other Ambulatory Visit: Payer: Medicaid Other

## 2022-09-27 NOTE — Patient Outreach (Signed)
  Medicaid Managed Care   Unsuccessful Outreach Note  09/27/2022 Name: Craig Schmidt MRN: [id number] DOB: 08-01-1970  Referred by: [name], NP Reason for referral : High Risk Managed Medicaid (MM Social work unsuccessful Telephone outreach )   An unsuccessful telephone outreach was attempted today. The patient was referred to the case management team for assistance with care management and care coordination.   Follow Up Plan: A HIPAA compliant phone message was left for the patient providing contact information and requesting a return call.   Mickel Fuchs, BSW, [place] Managed Medicaid Team  [phone]

## 2022-09-27 NOTE — Patient Instructions (Signed)
  Medicaid Managed Care   Unsuccessful Outreach Note  09/27/2022 Name: Craig Schmidt MRN: [id number] DOB: 12-12-1970  Referred by: [name], NP Reason for referral : High Risk Managed Medicaid (MM Social work unsuccessful Telephone outreach )   An unsuccessful telephone outreach was attempted today. The patient was referred to the case management team for assistance with care management and care coordination.  An unsuccessful telephone outreach was attempted today. The patient was referred to the case management team for assistance with care management and care coordination.  Follow Up Plan: A HIPAA compliant phone message was left for the patient providing contact information and requesting a return call.  Mickel Fuchs, BSW, [place] Managed Medicaid Team  [phone]

## 2022-09-27 NOTE — Patient Instructions (Signed)
Visit Information  [name]  - as a part of your Medicaid benefit, you are eligible for care management and care coordination services at no cost or copay. I was unable to reach you by phone today but would be happy to help you with your health related needs. Please feel free to call me @ [phone].   A member of the Managed Medicaid care management team will reach out to you again over the next 7 days.   Lurena Joiner RN, BSN [place]  [employer] manager

## 2022-09-27 NOTE — Patient Outreach (Signed)
  Medicaid Managed Care   Unsuccessful Attempt Note   09/27/2022 Name: Craig Schmidt MRN: [id number] DOB: 1970/11/17  Referred by: [name], NP Reason for referral : High Risk Managed Medicaid (Unsuccessful RNCM initial outreach)   A second unsuccessful telephone outreach was attempted today. The patient was referred to the case management team for assistance with care management and care coordination.    Follow Up Plan: A HIPAA compliant phone message was left for the patient providing contact information and requesting a return call. and The Managed Medicaid care management team will reach out to the patient again over the next 7 days.    Lurena Joiner RN, BSN [place]  [employer] manager

## 2022-09-28 ENCOUNTER — Telehealth (HOSPITAL_COMMUNITY): Payer: Self-pay | Admitting: Emergency Medicine

## 2022-09-28 ENCOUNTER — Other Ambulatory Visit (HOSPITAL_COMMUNITY): Payer: Self-pay | Admitting: Emergency Medicine

## 2022-09-28 ENCOUNTER — Other Ambulatory Visit: Payer: Medicaid Other | Admitting: Licensed Clinical Social Worker

## 2022-09-28 NOTE — Progress Notes (Signed)
Stopped by campsite to attempt to locate Mr. Lebert and he was not there.      [name], [place] 09/28/2022

## 2022-09-28 NOTE — Patient Instructions (Signed)
Visit Information  Mr. Slovick was given information about Medicaid Managed Care team care coordination services as a part of their Healthy [facility] Medicaid benefit. Lijah Sarabia Ravi verbally consented to engagement with the [facility] Managed Care team.   If you are experiencing a medical emergency, please call 911 or report to your local emergency department or urgent care.   If you have a non-emergency medical problem during routine business hours, please contact your provider's office and ask to speak with a nurse.   For questions related to your Healthy [facility] health plan, please call: [phone] or visit the homepage here: [email or web address]  If you would like to schedule transportation through your Healthy [facility] plan, please call the following number at least 2 days in advance of your appointment: [phone]  For information about your ride after you set it up, call Ride Assist at [phone]. Use this number to activate a Will Call pickup, or if your transportation is late for a scheduled pickup. Use this number, too, if you need to make a change or cancel a previously scheduled reservation.  If you need transportation services right away, call [phone]. The after-hours call center is staffed 24 hours to handle ride assistance and urgent reservation requests (including discharges) 365 days a year. Urgent trips include sick visits, hospital discharge requests and life-sustaining treatment.  Call the [place] at [phone], at any time, 24 hours a day, 7 days a week. If you are in danger or need immediate medical attention call 911.  If you would like help to quit smoking, call 1-800-QUIT-NOW 3471172302) OR Espaol: 1-855-Djelo-Ya HD:1601594) o para ms informacin haga clic aqu or Text READY to 200-400 to register via text   Following is a copy of your plan of care:  Care Plan : LCSW plan of care   Updates made by Greg Cutter, LCSW since 09/28/2022 12:00 AM     Problem: Addictive Behavior      Long-Range Goal: Addictive Behavior Managed   Start Date: 09/10/2022  Note:   Timeframe:  Long-Range Goal Priority:  Medium Start Date:  09/10/22 at                    Expected End Date: ongoing                 Follow Up Date- 10/01/22 at 330 pm   - avoid negative self-talk - be honest with myself - begin personal counseling - check out recovery housing - exercise at least 2 to 3 times per week - identify a recovery coach - identify triggers for relapse - join AA (Alcoholics Anonymous) or NA (Narcotics Anonymous) - learn how to handle negative feelings without drugs or alcohol - make a list of pleasurable things to do without using alcohol or drugs - make a plan to deal with triggers like holidays and anniversaries - make a plan to fix broken relationships - make a plan to handle bad days - make a written relapse plan - return to AA (Alcoholics Anonymous) or NA (Narcotics Anonymous) - spend time or talk with a recovery coach or support person at least 2 or 3 times every week - spend time or talk with people who do not drink or use every day    Why is this important?   It is possible that after you stop drinking or using drugs that you may slip and use again. This is called a relapse.  A relapse does not  mean that you have failed. It means you have had one or a few bad days.  Make sure you know why it is important for you to stay sober and remind yourself often.  Once you figure out what triggered the relapse and take the steps to deal with it, you can be even more hopeful that recovery is possible.      10 LITTLE Things To Do When You're Feeling Too Down To Do Anything  Take a shower. Even if you plan to stay in all day long and not see a soul, take a shower. It takes the most effort to hop in to the shower but once you do, you'll feel immediate results. It will wake you up  and you'll be feeling much fresher (and cleaner too).  Brush and floss your teeth. Give your teeth a good brushing with a floss finish. It's a small task but it feels so good and you can check 'taking care of your health' off the list of things to do.  Do something small on your list. Most of [place] have some small thing we would like to get done (load of laundry, sew a button, email a friend). Doing one of these things will make you feel like you've accomplished something.  Drink water. Drinking water is easy right? It's also really beneficial for your health so keep a glass beside you all day and take sips often. It gives you energy and prevents you from boredom eating.  Do some floor exercises. The last thing you want to do is exercise but it might be just the thing you need the most. Keep it simple and do exercises that involve sitting or laying on the floor. Even the smallest of exercises release chemicals in the brain that make you feel good. Yoga stretches or core exercises are going to make you feel good with minimal effort.  Make your bed. Making your bed takes a few minutes but it's productive and you'll feel relieved when it's done. An unmade bed is a huge visual reminder that you're having an unproductive day. Do it and consider it your housework for the day.  Put on some nice clothes. Take the sweatpants off even if you don't plan to go anywhere. Put on clothes that make you feel good. Take a look in the mirror so your brain recognizes the sweatpants have been replaced with clothes that make you look great. It's an instant confidence booster.  Wash the dishes. A pile of dirty dishes in the sink is a reflection of your mood. It's possible that if you wash up the dishes, your mood will follow suit. It's worth a try.  Cook a real meal. If you have the luxury to have a "do nothing" day, you have time to make a real meal for yourself. Make a meal that you love to eat. The process is good  to get you out of the funk and the food will ensure you have more energy for tomorrow.  Write out your thoughts by hand. When you hand write, you stimulate your brain to focus on the moment that you're in so make yourself comfortable and write whatever comes into your mind. Put those thoughts out on paper so they stop spinning around in your head. Those thoughts might be the very thing holding you down.  Follow up goal

## 2022-09-28 NOTE — Patient Outreach (Signed)
Medicaid Managed Care Social Work Note  09/28/2022 Name:  Craig Schmidt MRN:  [id number] DOB:  [birth year]  Craig Schmidt is an 52 y.o. year old male who is a primary patient of Craig Herter, NP.  The Medicaid Managed Care Coordination team was consulted for assistance with:  Ong and Resources  Craig Schmidt was given information about Medicaid Managed Care Coordination team services today. Craig Schmidt Patient agreed to services and verbal consent obtained.  Engaged with patient  for by telephone forfollow up visit in response to referral for case management and/or care coordination services.   Assessments/Interventions:  Review of past medical history, allergies, medications, health status, including review of consultants reports, laboratory and other test data, was performed as part of comprehensive evaluation and provision of chronic care management services.  SDOH: (Social Determinant of Health) assessments and interventions performed: SDOH Interventions    Flowsheet Row Telephone from 09/26/2022 in [place] and [place] Patient Outreach Telephone from 09/10/2022 in [place] Telephone from 09/07/2022 in [place] and [place] Telephone from 08/30/2022 in [place] and [place] Telephone from 08/22/2022 in [place] and [place] Telephone from 08/02/2022 in [place] and [place]  SDOH Interventions        Food Insecurity Interventions -- -- -- -- -- Other (Comment)  [petty cash]  Housing Interventions Other (Comment)  [PEH] -- -- -- -- --  Transportation Interventions -- -- [employer] Given [employer] Given Contracted Vendor, Bus Pass Given --  Financial Strain Interventions -- -- Other (Comment)  [gave jacket to help keep warm and dry] -- -- Other (Comment)  [patient care  fund]  Stress Interventions -- [employer], Provide Counseling -- -- -- --       Advanced Directives Status:  See Care Plan for related entries.  Care Plan                 Allergies  Allergen Reactions   Shellfish Allergy Anaphylaxis   Peanut-Containing Drug Products Itching    Medications Reviewed Today     Reviewed by Craig Schmidt, Paramedic (Certified Medical Assistant) on 09/06/22 at 1225  Med List Status: <None>   Medication Order Taking? Sig Documenting Provider Last Dose Status Informant  apixaban (ELIQUIS) 5 MG TABS tablet [id number] Yes Take 1 tablet (5 mg total) by mouth 2 (two) times daily. Craig Schmidt D, NP Taking Active Self  atorvastatin (LIPITOR) 40 MG tablet [id number] Yes TAKE 1 TABLET(40 MG) BY MOUTH DAILY Craig Dresser, MD Taking Active   carvedilol (COREG) 3.125 MG tablet [id number] Yes Take 1 tablet (3.125 mg total) by mouth 2 (two) times daily. Craig Jester M, [name] Taking Active   clopidogrel (PLAVIX) 75 MG tablet [id number] Yes TAKE ONE TABLET BY MOUTH ONCE DAILY Clegg, [name], NP Taking Active   digoxin (LANOXIN) 0.125 MG tablet [id number] Yes TAKE ONE TABLET BY MOUTH ONCE DAILY Clegg, [name], NP Taking Active   famotidine (PEPCID) 20 MG tablet [id number] Yes TAKE ONE TABLET BY MOUTH ONCE DAILY Clegg, [name], NP Taking Active   Glycerin-Hypromellose-PEG 400 0.2-0.2-1 % SOLN [id number] Yes Place 1 drop into both eyes as needed. Clegg, [name], NP Taking Active Self  isosorbide-hydrALAZINE (BIDIL) 20-37.5 MG tablet [id number] No Take 0.5 tablets by mouth 3 (three) times daily. Take 1 & 1/2 tablets by  mouth 3 times daily.  Patient not taking: Reported on 09/06/2022   [name], [name] Not Taking Active   JARDIANCE 10 MG TABS tablet [id number] Yes TAKE ONE TABLET BY MOUTH ONCE DAILY Clegg, [name], NP Taking Active   lamoTRIgine (LAMICTAL) 25 MG tablet [id number]  Take 1 tablet (25 mg total) by mouth daily for 14 days. Then increase to 50 mg  daily. Craig Schlatter, MD  Expired 07/10/22 2359   lamoTRIgine (LAMICTAL) 25 MG tablet [id number]  Take 2 tablets (50 mg total) by mouth daily for 14 days. Craig Schlatter, MD  Expired 07/12/22 2359   meloxicam (MOBIC) 7.5 MG tablet [id number] Yes TAKE ONE TABLET BY MOUTH ONCE DAILY [name], [name], [name] Taking Active   potassium chloride SA (KLOR-CON Schmidt) 20 MEQ tablet [id number] Yes Take 1 tablet (20 mEq total) by mouth daily. Craig Schmidt, [name], [name] Taking Active   sacubitril-valsartan (ENTRESTO) 24-26 MG [id number] Yes Take 1 tablet by mouth 2 (two) times daily. [name], [name], [name] Taking Active   spironolactone (ALDACTONE) 25 MG tablet [id number] Yes Take 0.5 tablets (12.5 mg total) by mouth at bedtime. Craig Jester M, [name] Taking Active   torsemide (DEMADEX) 20 MG tablet [id number] Yes Take 1 tablet (20 mg total) by mouth daily. [name], [place] Taking Active   triamcinolone ointment (KENALOG) 0.5 % [id number] Yes APPLY TOPICALLY TO THE AFFECTED AREA TWICE DAILY Craig Herter, NP Taking Active             Patient Active Problem List   Diagnosis Date Noted   Chronic combined systolic and diastolic heart failure (HCC)    ST elevation myocardial infarction (STEMI) of inferolateral wall, subsequent episode of care ([place]) 12/19/2021   ST elevation myocardial infarction (STEMI) (HCC)    Ischemic cardiomyopathy    Cocaine abuse ([place])    Smoker    Alcohol abuse    Acute ischemic stroke ([place]) 09/08/2021   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   Tear of MCL (medial collateral ligament) of knee 05/20/2015   Protein-calorie malnutrition, severe 05/17/2015   Lactic acidosis 05/15/2015   Left knee pain 05/15/2015   Lung nodules 05/15/2015   Hypoglycemia 05/14/2015   Hypothermia 05/14/2015   Acute encephalopathy 05/14/2015   Cocaine abuse with intoxication ([place]) 05/14/2015   Alcohol intoxication in active alcoholic ([place]) [id number]   Sepsis ([place]) 05/14/2015    Homeless 05/14/2015    Timeframe:  Long-Range Goal Priority:  Medium Start Date:  09/10/22 at                    Expected End Date: ongoing                 Follow Up Date- 10/01/22 at 330 pm   - avoid negative self-talk - be honest with myself - begin personal counseling - check out recovery housing - exercise at least 2 to 3 times per week - identify a recovery coach - identify triggers for relapse - join AA (Alcoholics Anonymous) or NA (Narcotics Anonymous) - learn how to handle negative feelings without drugs or alcohol - make a list of pleasurable things to do without using alcohol or drugs - make a plan to deal with triggers like holidays and anniversaries - make a plan to fix broken relationships - make a plan to handle bad days - make a written relapse plan - return to AA (Alcoholics Anonymous) or NA (Narcotics Anonymous) - spend time or talk with a recovery  coach or support person at least 2 or 3 times every week - spend time or talk with people who do not drink or use every day    Why is this important?   It is possible that after you stop drinking or using drugs that you may slip and use again. This is called a relapse.  A relapse does not mean that you have failed. It means you have had one or a few bad days.  Make sure you know why it is important for you to stay sober and remind yourself often.  Once you figure out what triggered the relapse and take the steps to deal with it, you can be even more hopeful that recovery is possible.       - avoid negative self-talk - develop a personal safety plan - develop a plan to deal with triggers like holidays, anniversaries - exercise at least 2 to 3 times per week - have a plan for how to handle bad days - journal feelings and what helps to feel better or worse - spend time or talk with others at least 2 to 3 times per week - spend time or talk with others every day - watch for early signs of feeling worse - write in  journal every day    Why is this important?   Keeping track of your progress will help your treatment team find the right mix of medicine and therapy for you.  Write in your journal every day.  Day-to-day changes in depression symptoms are normal. It may be more helpful to check your progress at the end of each week instead of every day.       Current barriers:   Chronic Mental Health needs related to Depression and Alcohol/Substance Abuse 30 plus years of homelessness, patient lives in a tent Low support network Currently unable to independently self manage needs related to mental health conditions. Knowledge Deficits related to short term plan for care coordination  needs and long term plans for chronic disease management Lacks knowledge of where and how to connect for mental health support Needs Support, Education, and Care Coordination in order to meet unmet need.   Clinical Goal(s): Over the next 120 days, patient will work with SW to reduce or manage symptoms of cravings and anxiety/depression until connected for ongoing counseling or finds medication that helps his symptom relief.   Clinical Interventions:  Assessed patient's previous treatment, needs, coping skills, current treatment, support system and barriers to care Patient interviewed and appropriate assessments performed or reviewed: brief mental health assessment;Suicidal Ideation/Homicidal Ideation: No Provided basic mental health support, education and interventions  Patient reports that he is has nursing and social work needs. He reports that he is looking for stable housing, transportation resources, dental resources, mental health resources and nursing case management for CHF due to recent hospitalization.  Relapse prevention education provided to patient on 09/10/22 Discussed several options for long term counseling based on need and insurance.  Reviewed mental health medications with patient prescribed by PCP and  discussed compliance  [facility] LCSW educated patient on medication therapy that can help with alcohol triggers. Other interventions include: Motivational Interviewing Solution-Focused Strategies Mindfulness or Psychologist, educational  Inter-disciplinary care team collaboration (see longitudinal plan of care) [facility] LCSW educated patient on healthy coping skills to implement into is daily routine to combat alcohol substance cravings and anxiety and depressive symptoms once they arise. [facility] LCSW made referral to [place] and BSW on 09/10/22. Referral  made to [facility] for individual counseling on 09/10/22 with patient's consent. Patient has two upcoming mental health appointments in March at [facility]. Patient physically wrote down resource contact information.  Past update, referral from PCP states-Needs as much support as is available. Chronically homeless with wife and dog- currently in hotel being paid for through [facility] and supposedly working with them to assess for other options but nothing yet. Has SSI income. Has chronic mental health and competency concerns which make him difficult to work with somewhat. Has issues with transport to appts due to lack of stable address so hard to set up medicaid transport. Needs chronic case management to help reduce risk of admission is a high ED utilizer. Being seen by our Community Paramedic at this time but he will likely not be able to remain on program for much longer as he is not [facility] so want to help find support for him following DC. [facility] team is now involved with patient.    Patient Goals/Self-Care Activities: Over the next 120 days Implement interventions discussed today to decreases alcohol usage, symptoms of anxiety and depression and increase knowledge and/or ability of: coping skills, healthy habits, self-management skills, and stress reduction.   Alcohol Use Education   Score 0 = Abstainers Score 8-19 = Unhealthy/High Risk Drinkers  Score 1-7 = Low Risk Drinkers Score 20+  = Probable Alcohol Dependence   High Scores (20+) on the Alcohol Use Identification Test Consider becoming involved in a structured program.  You should stop drinking if: You have tried to cut down before but have not been successful, or  You suffer from morning shakes during a heavy drinking period, or You have high blood pressure, or You are pregnant, or You have liver disease, or You are taking medicines that react with alcohol, or Your alcohol use is affecting your social relationships, or You have legal consequences like DUIs, or You call in sick to work, or You cannot take care of our children, or Someone close to you says you drink too much    How Much Alcohol is a Drink: Beer: 12 oz. = 1 drink 16 oz. = 1.3 drinks 22 oz. = 2 drinks 40 oz. = 3.3 drinks  Wine: 5 oz. = 1 drink 740 mL (25 oz.) bottle = 5 drinks Malt Liquor: 12 oz. = 1.5 drinks 16 oz. = 2 drinks 22 oz. = 2.5 drinks 40 oz. = 4.5 drinks  80-Proof Spirits - Hard Liquor: 1 shot = 1 drink 1 mixed drink = number of shots Can equal 1-3 drinks   What is Low-risk Drinking? Have no more than 2 drinks of alcohol per day Drink no more than 5 days per week Do not drink alcohol drink alcohol when: You drive or operate machinery You are pregnant or breast feeding You are taking medications that interact with alcohol You have medical conditions made worse with alcohol You can stop or control your drinking      Identify Your Triggers for Drinking Parties Particular People Feeling lonely Feeling tense Family problems Feeling sad Feeling happy Feeling bored After work Problems sleeping Criticism Feelings of failure After being paid When others are drinking In bars When out for dinner After arguments Weekends Feeling restless Being in pain   Effects of High-Risk Drinking To the Brain: Aggressive, irrational behavior Arguments, violence Depression, nervousness Alcohol dependence, memory  loss To the Nervous System: Trembling hands, tingling fingers Numbness, painful nerves Impaired sensation leading to falls Numb tingling toes To Your Lifestyle:  Social, legal, medical problems Domestic trouble/relationship loss Job loss & financial problems Shortened life span Accidents and death from drunk driving   To the Face: Premature aging, drinker's nose Cancer of the throat & mouth To the Body: Frequent cold Reduced resistance to infection Increased risk of pneumonia Weakness of heart muscle Heart failure, anemia Impaired blood clotting Breast cancer Vitamin deficiency, bleeding Severe Inflammation of the stomach Vomiting, diarrhea, malnutrition Ulcer, inflammation of the pancreas Impaired sexual performance Birth defects, including deformities, retardation, and low birthweight   Ways to Cope Without Drinking Go home if you tend to drink after work Find another activity Switch to nonalcoholic beverages Change friends Join a Financial planner Visit relatives Plan/take a trip Go for a walk Take up a hobby Listen to music Talk to a friend Reading What would you do if you had no worries about failing?         Good Reasons for Drinking Less I will live longer - probably 8-10 years. I will sleep better. I will be happier. I will save a lot of money My relationships will improve. I will stay younger for longer. I will achieve more in my life There will be a greater chance that I will survive to a healthy old age with no premature damage to my brain.  I will be better at my job. I will be less likely to feel depressed and commit suicide (6 times less likely). I will be less likely to die of heart disease or cancer. Other people will respect me I will be less likely to get into trouble with the police. The possibility that I will die of liver disease will be dramatically reduced (12 times less likely). It will be less likely that I will die in a car  accident (3 times less likely).   Strategies for Cutting Down Keep Track.  Find a way to keep track of how much you drink.  If you make a note of each drink before you drink it, this will help slow you down. Count and Measure.  Know the standard drink sizes.  Ask the bartender or server about the amount of alcohol in a mixed drink. Set Goals.  Decide how many days a week you will drink and how many drinks each day. Pace and Space.  When you do drink, pace yourself.  Have no more than one drink with alcohol per hour.  Alternate "drink spacers" non-alcoholic drinks such as water, soda, or juice with drinks containing alcohol. Include Food.  Don't drink on an empty stomach.  Have some food so the alcohol will be absorbed more slowly into your system.  Avoid Triggers.  Avoid people, places, or activities that have led to drinking in the past.  Certain times of day or feelings may also be triggers.  Make a plan so you will know what you can do instead of drinking. Plan to Handle Urges.  When an urge hits, consider these options:  Remind yourself of your reasons for changing.  Or talk it through with someone you trust. Or get involved with a healthy, distracting activity.  Or, "urge surf" - instead of fighting the feeling, accept it and ride it out, knowing it will soon crest like a wave and pass. Know Your "No".  Have a polite, convincing "no thanks" for those times when you may be offered a drink and don't want one.  The faster you can say no to these offers, the less likely you are to give  in.  If you hesitate, it allows you time to think of excuses to go along.        10 LITTLE Things To Do When You're Feeling Too Down To Do Anything  Take a shower. Even if you plan to stay in all day long and not see a soul, take a shower. It takes the most effort to hop in to the shower but once you do, you'll feel immediate results. It will wake you up and you'll be feeling much fresher (and cleaner too).  Brush  and floss your teeth. Give your teeth a good brushing with a floss finish. It's a small task but it feels so good and you can check 'taking care of your health' off the list of things to do.  Do something small on your list. Most of [place] have some small thing we would like to get done (load of laundry, sew a button, email a friend). Doing one of these things will make you feel like you've accomplished something.  Drink water. Drinking water is easy right? It's also really beneficial for your health so keep a glass beside you all day and take sips often. It gives you energy and prevents you from boredom eating.  Do some floor exercises. The last thing you want to do is exercise but it might be just the thing you need the most. Keep it simple and do exercises that involve sitting or laying on the floor. Even the smallest of exercises release chemicals in the brain that make you feel good. Yoga stretches or core exercises are going to make you feel good with minimal effort.  Make your bed. Making your bed takes a few minutes but it's productive and you'll feel relieved when it's done. An unmade bed is a huge visual reminder that you're having an unproductive day. Do it and consider it your housework for the day.  Put on some nice clothes. Take the sweatpants off even if you don't plan to go anywhere. Put on clothes that make you feel good. Take a look in the mirror so your brain recognizes the sweatpants have been replaced with clothes that make you look great. It's an instant confidence booster.  Wash the dishes. A pile of dirty dishes in the sink is a reflection of your mood. It's possible that if you wash up the dishes, your mood will follow suit. It's worth a try.  Cook a real meal. If you have the luxury to have a "do nothing" day, you have time to make a real meal for yourself. Make a meal that you love to eat. The process is good to get you out of the funk and the food will ensure you have  more energy for tomorrow.  Write out your thoughts by hand. When you hand write, you stimulate your brain to focus on the moment that you're in so make yourself comfortable and write whatever comes into your mind. Put those thoughts out on paper so they stop spinning around in your head. Those thoughts might be the very thing holding you down.  Follow up goal Follow up:  Patient agrees to Care Plan and Follow-up.  Plan: The Managed Medicaid care management team will reach out to the patient again over the next 30 days.  Date/time of next scheduled Social Work care management/care coordination outreach:  10/01/22 at 3:30 pm.   Eula Fried, BSW, MSW, [place] Medicaid LCSW [email or web address] Phone: [phone]

## 2022-09-28 NOTE — Telephone Encounter (Signed)
Phone call w/ no answer.  LVM

## 2022-09-28 NOTE — Telephone Encounter (Signed)
Attempted to contact w/o answer LVM.    [name], [place] 09/28/2022

## 2022-09-28 NOTE — Telephone Encounter (Signed)
Attempted phone call.  No answer LVM attempting to schedule visit and asked for return call.    [name], [place] 09/28/2022

## 2022-09-28 NOTE — Telephone Encounter (Signed)
Phone call 09/28/22 @ 12:21.  No answer, LVM regarding trying to schedule a Pt Outreach visit.    [name], [place] 09/28/2022

## 2022-10-01 ENCOUNTER — Other Ambulatory Visit: Payer: Medicaid Other | Admitting: Licensed Clinical Social Worker

## 2022-10-01 NOTE — Patient Outreach (Signed)
  Medicaid Managed Care   Unsuccessful Attempt Note   10/01/2022 Name: Craig Schmidt MRN: [id number] DOB: [birth year]  Referred by: [name], NP Reason for referral : No chief complaint on file.   An unsuccessful telephone outreach was attempted today. The patient was referred to the case management team for assistance with care management and care coordination.    Follow Up Plan: A HIPAA compliant phone message was left for the patient providing contact information and requesting a return call. and The Managed Medicaid care management team will reach out to the patient again over the next 30 days.   Eula Fried, BSW, MSW, [employer] [facility] Managed Medicaid LCSW [email or web address] Phone: [phone]

## 2022-10-02 ENCOUNTER — Other Ambulatory Visit (HOSPITAL_COMMUNITY): Payer: Self-pay | Admitting: Emergency Medicine

## 2022-10-02 ENCOUNTER — Telehealth: Payer: Self-pay | Admitting: Licensed Clinical Social Worker

## 2022-10-02 ENCOUNTER — Telehealth: Payer: Self-pay

## 2022-10-02 DIAGNOSIS — I213 ST elevation (STEMI) myocardial infarction of unspecified site: Secondary | ICD-10-CM | POA: Diagnosis not present

## 2022-10-02 DIAGNOSIS — I5043 Acute on chronic combined systolic (congestive) and diastolic (congestive) heart failure: Secondary | ICD-10-CM | POA: Diagnosis not present

## 2022-10-02 NOTE — Telephone Encounter (Signed)
Error

## 2022-10-02 NOTE — Patient Outreach (Signed)
  Medicaid Managed Care   Unsuccessful Attempt Note   10/02/2022 Name: Craig Schmidt MRN: [id number] DOB: [birth year]  Referred by: [name], NP Reason for referral : No chief complaint on file.   An unsuccessful telephone outreach was attempted today. The patient was referred to the case management team for assistance with care management and care coordination.    Follow Up Plan: The Managed Medicaid care management team will reach out to the patient again over the next 30 days. A voice message was unable to be left.  Eula Fried, BSW, MSW, [employer] [facility] Managed Medicaid LCSW [email or web address] Phone: [phone]

## 2022-10-02 NOTE — Progress Notes (Signed)
I have been calling and texting Mr. Hesser for over a week with no response.  Today I went by his camp site and he was not there.  As I was leaving the area I saw him sitting at the bus stop on [address] and [facility].  I stopped to check with him.  He was alert and oriented x 4, skin W&D w/ good color. He was obviously intoxicated with the strong odor of ETOH present.  He admits to drinking and smoking crack and not taking his meds.  He was fairly aggressive in his conversation about how frustrated he was at his living situation and that "nobody seems to be helping me."  I advised him that I was doing all the things that I could do and I was sorry for his frustrations. I did not feel comfortable pursuing further conversation due to his aggressive body language and talk.   I will discharge him due to non-compliance and continued drug use as this is a safety issue.  Patient is now discharged from [facility].  Patient has/has not met the following goals:  Yes :Patient expresses basic understanding of medications and what they are for Yes :Patient able to verbalize heart failure specific dietary/fluid restrictions Yes :Patient is aware of who to call if they have medical concerns or if they need to schedule or change appts No :Patient has a scale for daily weights and weighs regularly Yes :Patient able to verbalize concerning symptoms when they should call the HF clinic (weight gain ranges, etc) Yes :Patient has a PCP and has seen within the past year or has upcoming appt No :Patient has reliable access to getting their medications No :Patient has shown they are able to reorder medications reliably No :Patient has had admission in past 30 days- if yes how many? Yes :Patient has had admission in past 90 days- if yes how many? 1 admission for pneumonia  Discharge Comments:  Mr. Nadeem  has not been compliant with his medications.  He admits to smoking crack and was  visibly and admittedly intoxicated  during today's encounter.  He made the statement, "I'm frustrated and honestly, I just want to be left alone."  His tone was aggressive and I have not witnessed this behavior personally until today.  I am discharging Mr. Borjon due to non-compliance and illegal drug use presenting a safety concern.    [name], [place] 10/02/2022

## 2022-10-02 NOTE — Telephone Encounter (Signed)
..   Medicaid Managed Care   Unsuccessful Outreach Note  10/02/2022 Name: Craig Schmidt MRN: [id number] DOB: 1970-09-15  Referred by: [name], NP Reason for referral : Appointment (I called the patient today to get him rescheduled with the Managed Medicaid RNCM, LCSW and the BSW. His number was not in order when I called. )   Third unsuccessful telephone outreach was attempted today. The patient was referred to the case management team for assistance with care management and care coordination. The patient's primary care provider has been notified of our unsuccessful attempts to make or maintain contact with the patient. The care management team is pleased to engage with this patient at any time in the future should he/she be interested in assistance from the care management team.   Follow Up Plan: We have been unable to make contact with the patient for follow up. The care management team is available to follow up with the patient after provider conversation with the patient regarding recommendation for care management engagement and subsequent re-referral to the care management team.   [place], [place]

## 2022-10-02 NOTE — Patient Instructions (Signed)
Corlis Hove ,   The [facility] Managed Care Team is available to provide assistance to you with your healthcare needs at no cost and as a benefit of your [facility] Health plan. I'm sorry I was unable to reach you today for our scheduled appointment. Our care guide will call you to reschedule our telephone appointment. Please call me at the number below. I am available to be of assistance to you regarding your healthcare needs. .   Thank you,   Eula Fried, BSW, MSW, LCSW Managed Medicaid LCSW [email or web address] Phone: [phone]

## 2022-10-02 NOTE — Telephone Encounter (Deleted)
..   Medicaid Managed Care   Unsuccessful Outreach Note  10/02/2022 Name: Craig Schmidt MRN: [id number] DOB: 04-29-1971  Referred by: [name], NP Reason for referral : Appointment (I called the patient today to get him rescheduled with the Managed Medicaid RNCM, LCSW and the BSW. His number was not in order when I called. )   A second unsuccessful telephone outreach was attempted today. The patient was referred to the case management team for assistance with care management and care coordination.   Follow Up Plan: The care management team will reach out to the patient again over the next 7 days.   [place]

## 2022-10-02 NOTE — Progress Notes (Signed)
..   Medicaid Managed Care   Unsuccessful Outreach Note  10/02/2022 Name: Craig Schmidt MRN: [id number] DOB: March 17, 1971  Referred by: [name], NP Reason for referral : Appointment (I called the patient today to get him rescheduled with the Managed Medicaid RNCM, LCSW and the BSW. His number was not in order when I called. )   A second unsuccessful telephone outreach was attempted today. The patient was referred to the case management team for assistance with care management and care coordination.   Follow Up Plan: The care management team will reach out to the patient again over the next *** days.   [place]

## 2022-10-03 ENCOUNTER — Encounter (HOSPITAL_COMMUNITY): Payer: Self-pay | Admitting: Student

## 2022-10-03 ENCOUNTER — Encounter (HOSPITAL_COMMUNITY): Payer: Medicaid Other | Admitting: Student

## 2022-10-03 ENCOUNTER — Telehealth (HOSPITAL_COMMUNITY): Payer: Self-pay | Admitting: Licensed Clinical Social Worker

## 2022-10-03 DIAGNOSIS — I5022 Chronic systolic (congestive) heart failure: Secondary | ICD-10-CM

## 2022-10-03 NOTE — Telephone Encounter (Signed)
H&V Care Navigation CSW Progress Note  Clinical Social Worker informed of pt DC by paramedic due to pt being verbally aggressive with paramedic during visit.  Pt needing assistance with med management and compliance still so placed consult to Managed Medicaid to assist.  Patient is participating in a Managed Medicaid Plan:  Yes  [place]: Food Insecurity Present (08/02/2022)  Housing: Medium Risk (09/26/2022)  Transportation Needs: Unmet Transportation Needs (09/07/2022)  Alcohol Screen: High Risk (09/19/2021)  Depression (PHQ2-9): High Risk (06/13/2022)  Financial Resource Strain: High Risk (09/07/2022)  Stress: Stress Concern Present (09/10/2022)  Tobacco Use: High Risk (08/23/2022)   Jorge Ny, [place] Desk#: [phone] Cell#: [phone]

## 2022-10-03 NOTE — Telephone Encounter (Signed)
Encounter opened in error. Patient no call/no show for appointment. Patient called, and VM left giving walk-in hours.  [name], MD PGY-2 10/03/2022  4:10 PM [place] [facility] of Psychiat[facility]

## 2022-10-04 DIAGNOSIS — R32 Unspecified urinary incontinence: Secondary | ICD-10-CM | POA: Diagnosis not present

## 2022-10-05 ENCOUNTER — Other Ambulatory Visit: Payer: Medicaid Other | Admitting: Licensed Clinical Social Worker

## 2022-10-05 NOTE — Patient Outreach (Signed)
Medicaid Managed Care Social Work Note  10/05/2022 Name:  Craig Schmidt MRN:  [id number] DOB:  [birth year]  Craig Schmidt is an 52 y.o. year old male who is a primary patient of [name], NP.  The Medicaid Managed Care Coordination team was consulted for assistance with:  Craig Schmidt and Resources  Craig Schmidt was given information about Medicaid Managed Care Coordination team services today. Craig Schmidt Patient agreed to services and verbal consent obtained.  Engaged with patient  for by telephone forfollow up visit in response to referral for case management and/or care coordination services.   Assessments/Interventions:  Review of past medical history, allergies, medications, health status, including review of consultants reports, laboratory and other test data, was performed as part of comprehensive evaluation and provision of chronic care management services.  SDOH: (Social Determinant of Health) assessments and interventions performed: SDOH Interventions    Flowsheet Row Telephone from 09/26/2022 in [place] and [place] Patient Outreach Telephone from 09/10/2022 in [place] Telephone from 09/07/2022 in [place] and [place] Telephone from 08/30/2022 in [place] and [place] Telephone from 08/22/2022 in [place] and [place] Telephone from 08/02/2022 in [place] and [place]  SDOH Interventions        Food Insecurity Interventions -- -- -- -- -- Other (Comment)  [petty cash]  Housing Interventions Other (Comment)  [PEH] -- -- -- -- --  Transportation Interventions -- -- [employer] Given [employer] Given Contracted Vendor, Bus Pass Given --  Financial Strain Interventions -- -- Other (Comment)  [gave jacket to help keep warm and dry] -- -- Other (Comment)  [patient  care fund]  Stress Interventions -- [employer], Provide Counseling -- -- -- --       Advanced Directives Status:  See Care Plan for related entries.  Care Plan                 Allergies  Allergen Reactions   Shellfish Allergy Anaphylaxis   Peanut-Containing Drug Products Itching    Medications Reviewed Today     Reviewed by [name], Paramedic (Certified Medical Assistant) on 09/06/22 at 1225  Med List Status: <None>   Medication Order Taking? Sig Documenting Provider Last Dose Status Informant  apixaban (ELIQUIS) 5 MG TABS tablet [id number] Yes Take 1 tablet (5 mg total) by mouth 2 (two) times daily. [name] D, NP Taking Active Self  atorvastatin (LIPITOR) 40 MG tablet [id number] Yes TAKE 1 TABLET(40 MG) BY MOUTH DAILY [name], MD Taking Active   carvedilol (COREG) 3.125 MG tablet [id number] Yes Take 1 tablet (3.125 mg total) by mouth 2 (two) times daily. Craig Jester M, [name] Taking Active   clopidogrel (PLAVIX) 75 MG tablet [id number] Yes TAKE ONE TABLET BY MOUTH ONCE DAILY Clegg, [name], NP Taking Active   digoxin (LANOXIN) 0.125 MG tablet [id number] Yes TAKE ONE TABLET BY MOUTH ONCE DAILY Clegg, [name], NP Taking Active   famotidine (PEPCID) 20 MG tablet [id number] Yes TAKE ONE TABLET BY MOUTH ONCE DAILY Clegg, [name], NP Taking Active   Glycerin-Hypromellose-PEG 400 0.2-0.2-1 % SOLN [id number] Yes Place 1 drop into both eyes as needed. Clegg, [name], NP Taking Active Self  isosorbide-hydrALAZINE (BIDIL) 20-37.5 MG tablet [id number] No Take 0.5 tablets by mouth 3 (three) times daily. Take 1 & 1/2 tablets by  mouth 3 times daily.  Patient not taking: Reported on 09/06/2022   Craig Bihari, [name] Not Taking Active   JARDIANCE 10 MG TABS tablet [id number] Yes TAKE ONE TABLET BY MOUTH ONCE DAILY Clegg, [name], NP Taking Active   lamoTRIgine (LAMICTAL) 25 MG tablet [id number]  Take 1 tablet (25 mg total) by mouth daily for 14 days. Then increase to 50  mg daily. Craig Schlatter, MD  Expired 07/10/22 2359   lamoTRIgine (LAMICTAL) 25 MG tablet [id number]  Take 2 tablets (50 mg total) by mouth daily for 14 days. Craig Schlatter, MD  Expired 07/12/22 2359   meloxicam (MOBIC) 7.5 MG tablet [id number] Yes TAKE ONE TABLET BY MOUTH ONCE DAILY [name], Craig Bo, [name] Taking Active   potassium chloride SA (KLOR-CON Schmidt) 20 MEQ tablet [id number] Yes Take 1 tablet (20 mEq total) by mouth daily. Craig Schmidt, Craig Bo, [name] Taking Active   sacubitril-valsartan (ENTRESTO) 24-26 MG [id number] Yes Take 1 tablet by mouth 2 (two) times daily. Craig Schmidt, Craig Bo, [name] Taking Active   spironolactone (ALDACTONE) 25 MG tablet [id number] Yes Take 0.5 tablets (12.5 mg total) by mouth at bedtime. Craig Jester M, [name] Taking Active   torsemide (DEMADEX) 20 MG tablet [id number] Yes Take 1 tablet (20 mg total) by mouth daily. Craig Schmidt, Potter Schmidt Taking Active   triamcinolone ointment (KENALOG) 0.5 % [id number] Yes APPLY TOPICALLY TO THE AFFECTED AREA TWICE DAILY [name], NP Taking Active             Patient Active Problem List   Diagnosis Date Noted   Chronic combined systolic and diastolic heart failure (HCC)    ST elevation myocardial infarction (STEMI) of inferolateral wall, subsequent episode of care ([place]) 12/19/2021   ST elevation myocardial infarction (STEMI) (HCC)    Ischemic cardiomyopathy    Cocaine abuse ([place])    Smoker    Alcohol abuse    Acute ischemic stroke ([place]) 09/08/2021   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) [id number]   Sepsis ([place]) 05/14/2015    Homeless 05/14/2015    Conditions to be addressed/monitored per PCP order:  Depression  Care Plan : LCSW plan of care  Updates made by Greg Cutter, LCSW since 10/05/2022 12:00 AM     Problem: Addictive Behavior      Long-Range Goal: Addictive Behavior Managed   Start Date: 09/10/2022  Note:   Timeframe:  Long-Range Goal Priority:  Medium Start Date:  09/10/22 at                    Expected End Date: ongoing                 Follow Up Date- 10/16/22 at 18 am   - avoid negative self-talk - be honest with myself - begin personal counseling - check out recovery housing - exercise at least 2 to 3 times per week - identify a recovery coach - identify triggers for relapse - join AA (Alcoholics Anonymous) or NA (Narcotics Anonymous) - learn how to handle negative feelings without drugs or alcohol - make a list of pleasurable things to do without using  alcohol or drugs - make a plan to deal with triggers like holidays and anniversaries - make a plan to fix broken relationships - make a plan to handle bad days - make a written relapse plan - return to AA (Alcoholics Anonymous) or NA (Narcotics Anonymous) - spend time or talk with a recovery coach or support person at least 2 or 3 times every week - spend time or talk with people who do not drink or use every day    Why is this important?   It is possible that after you stop drinking or using drugs that you may slip and use again. This is called a relapse.  A relapse does not mean that you have failed. It means you have had one or a few bad days.  Make sure you know why it is important for you to stay sober and remind yourself often.  Once you figure out what triggered the relapse and take the steps to deal with it, you can be even more hopeful that recovery is possible.       - avoid negative self-talk - develop a personal safety plan - develop a plan to deal with triggers like holidays, anniversaries - exercise at least 2 to  3 times per week - have a plan for how to handle bad days - journal feelings and what helps to feel better or worse - spend time or talk with others at least 2 to 3 times per week - spend time or talk with others every day - watch for early signs of feeling worse - write in journal every day    Why is this important?   Keeping track of your progress will help your treatment team find the right mix of medicine and therapy for you.  Write in your journal every day.  Day-to-day changes in depression symptoms are normal. It may be more helpful to check your progress at the end of each week instead of every day.       Current barriers:   Chronic Mental Health needs related to Depression and Alcohol/Substance Abuse 30 plus years of homelessness, patient lives in a tent Low support network Currently unable to independently self manage needs related to mental health conditions. Knowledge Deficits related to short term plan for care coordination  needs and long term plans for chronic disease management Lacks knowledge of where and how to connect for mental health support Needs Support, Education, and Care Coordination in order to meet unmet need.   Clinical Goal(s): Over the next 120 days, patient will work with SW to reduce or manage symptoms of cravings and anxiety/depression until connected for ongoing counseling or finds medication that helps his symptom relief.   Clinical Interventions:  Assessed patient's previous treatment, needs, coping skills, current treatment, support system and barriers to care Patient interviewed and appropriate assessments performed or reviewed: brief mental health assessment;Suicidal Ideation/Homicidal Ideation: No Provided basic mental health support, education and interventions  Patient reports that he is has nursing and social work needs. He reports that he is looking for stable housing, transportation resources, dental resources, mental health resources and  nursing case management for CHF due to recent hospitalization.  Relapse prevention education provided to patient on 09/10/22 Discussed several options for long term counseling based on need and insurance.  Reviewed mental health medications with patient prescribed by PCP and discussed compliance  [facility] LCSW educated patient on medication therapy that can help with alcohol triggers. Other interventions include: Motivational Interviewing  Solution-Focused Strategies Mindfulness or Psychologist, educational  Inter-disciplinary care team collaboration (see longitudinal plan of care) [facility] LCSW educated patient on healthy coping skills to implement into is daily routine to combat alcohol substance cravings and anxiety and depressive symptoms once they arise. [facility] LCSW made referral to [place] and BSW on 09/10/22. Referral made to [facility] for individual counseling on 09/10/22 with patient's consent. Patient has two upcoming mental health appointments in March at [facility]. Patient physically wrote down resource contact information. [facility] LCSW 10/05/22 update- Patient was a no show for his scheduled psychiatry appointment at [facility] even though he was reminded of this appointment several times by [facility] LCSW. Patient reports that he will contact [facility] and reschedule visit or utilize their walk in clinic. Patient has a scheduled counseling appointment at [facility] on 10/17/22. Patient was provided [facility] walk in clinic information.  Past update, referral from PCP states-Needs as much support as is available. Chronically homeless with wife and dog- currently in hotel being paid for through [facility] and supposedly working with them to assess for other options but nothing yet. Has SSI income. Has chronic mental health and competency concerns which make him difficult to work with somewhat. Has issues with transport to appts due to lack of stable address so hard to set up medicaid transport. Needs chronic case management to help reduce risk of admission  is a high ED utilizer. Being seen by our Community Paramedic at this time but he will likely not be able to remain on program for much longer as he is not [facility] so want to help find support for him following DC. [facility] team is now involved with patient.    Patient Goals/Self-Care Activities: Over the next 120 days Implement interventions discussed today to decreases alcohol usage, symptoms of anxiety and depression and increase knowledge and/or ability of: coping skills, healthy habits, self-management skills, and stress reduction.  Alcohol Use Education   Score 0 = Abstainers Score 8-19 = Unhealthy/High Risk Drinkers  Score 1-7 = Low Risk Drinkers Score 20+ = Probable Alcohol Dependence  High Scores (20+) on the Alcohol Use Identification Test Consider becoming involved in a structured program.  You should stop drinking if: You have tried to cut down before but have not been successful, or  You suffer from morning shakes during a heavy drinking period, or You have high blood pressure, or You are pregnant, or You have liver disease, or You are taking medicines that react with alcohol, or Your alcohol use is affecting your social relationships, or You have legal consequences like DUIs, or You call in sick to work, or You cannot take care of our children, or Someone close to you says you drink too much   How Much Alcohol is a Drink: Beer: 12 oz. = 1 drink 16 oz. = 1.3 drinks 22 oz. = 2 drinks 40 oz. = 3.3 drinks  Wine: 5 oz. = 1 drink 740 mL (25 oz.) bottle = 5 drinks Malt Liquor: 12 oz. = 1.5 drinks 16 oz. = 2 drinks 22 oz. = 2.5 drinks 40 oz. = 4.5 drinks  80-Proof Spirits - Hard Liquor: 1 shot = 1 drink 1 mixed drink = number of shots Can equal 1-3 drinks  What is Low-risk Drinking? Have no more than 2 drinks of alcohol per day Drink no more than 5 days per week Do not drink alcohol drink alcohol when: You drive or operate machinery You are pregnant or breast feeding You  are taking medications that  interact with alcohol You have medical conditions made worse with alcohol You can stop or control your drinking      Identify Your Triggers for Drinking Parties Particular People Feeling lonely Feeling tense Family problems Feeling sad Feeling happy Feeling bored After work Problems sleeping Criticism Feelings of failure After being paid When others are drinking In bars When out for dinner After arguments Weekends Feeling restless Being in pain   Effects of High-Risk Drinking To the Brain: Aggressive, irrational behavior Arguments, violence Depression, nervousness Alcohol dependence, memory loss To the Nervous System: Trembling hands, tingling fingers Numbness, painful nerves Impaired sensation leading to falls Numb tingling toes To Your Lifestyle: Social, legal, medical problems Domestic trouble/relationship loss Job loss & financial problems Shortened life span Accidents and death from drunk driving   To the Face: Premature aging, drinker's nose Cancer of the throat & mouth To the Body: Frequent cold Reduced resistance to infection Increased risk of pneumonia Weakness of heart muscle Heart failure, anemia Impaired blood clotting Breast cancer Vitamin deficiency, bleeding Severe Inflammation of the stomach Vomiting, diarrhea, malnutrition Ulcer, inflammation of the pancreas Impaired sexual performance Birth defects, including deformities, retardation, and low birthweight  Ways to Cope Without Drinking Go home if you tend to drink after work Find another activity Switch to nonalcoholic beverages Change friends Join a Financial planner Visit relatives Plan/take a trip Go for a walk Take up a hobby Listen to music Talk to a friend Reading What would you do if you had no worries about failing?        Good Reasons for Drinking Less I will live longer - probably 8-10 years. I will sleep better. I will be  happier. I will save a lot of money My relationships will improve. I will stay younger for longer. I will achieve more in my life There will be a greater chance that I will survive to a healthy old age with no premature damage to my brain.  I will be better at my job. I will be less likely to feel depressed and commit suicide (6 times less likely). I will be less likely to die of heart disease or cancer. Other people will respect me I will be less likely to get into trouble with the police. The possibility that I will die of liver disease will be dramatically reduced (12 times less likely). It will be less likely that I will die in a car accident (3 times less likely).   Strategies for Cutting Down Keep Track.  Find a way to keep track of how much you drink.  If you make a note of each drink before you drink it, this will help slow you down. Count and Measure.  Know the standard drink sizes.  Ask the bartender or server about the amount of alcohol in a mixed drink. Set Goals.  Decide how many days a week you will drink and how many drinks each day. Pace and Space.  When you do drink, pace yourself.  Have no more than one drink with alcohol per hour.  Alternate "drink spacers" non-alcoholic drinks such as water, soda, or juice with drinks containing alcohol. Include Food.  Don't drink on an empty stomach.  Have some food so the alcohol will be absorbed more slowly into your system.  Avoid Triggers.  Avoid people, places, or activities that have led to drinking in the past.  Certain times of day or feelings may also be triggers.  Make a plan so you will  know what you can do instead of drinking. Plan to Handle Urges.  When an urge hits, consider these options:  Remind yourself of your reasons for changing.  Or talk it through with someone you trust. Or get involved with a healthy, distracting activity.  Or, "urge surf" - instead of fighting the feeling, accept it and ride it out, knowing it will  soon crest like a wave and pass. Know Your "No".  Have a polite, convincing "no thanks" for those times when you may be offered a drink and don't want one.  The faster you can say no to these offers, the less likely you are to give in.  If you hesitate, it allows you time to think of excuses to go along.       10 LITTLE Things To Do When You're Feeling Too Down To Do Anything  Take a shower. Even if you plan to stay in all day long and not see a soul, take a shower. It takes the most effort to hop in to the shower but once you do, you'll feel immediate results. It will wake you up and you'll be feeling much fresher (and cleaner too).  Brush and floss your teeth. Give your teeth a good brushing with a floss finish. It's a small task but it feels so good and you can check 'taking care of your health' off the list of things to do.  Do something small on your list. Most of [place] have some small thing we would like to get done (load of laundry, sew a button, email a friend). Doing one of these things will make you feel like you've accomplished something.  Drink water. Drinking water is easy right? It's also really beneficial for your health so keep a glass beside you all day and take sips often. It gives you energy and prevents you from boredom eating.  Do some floor exercises. The last thing you want to do is exercise but it might be just the thing you need the most. Keep it simple and do exercises that involve sitting or laying on the floor. Even the smallest of exercises release chemicals in the brain that make you feel good. Yoga stretches or core exercises are going to make you feel good with minimal effort.  Make your bed. Making your bed takes a few minutes but it's productive and you'll feel relieved when it's done. An unmade bed is a huge visual reminder that you're having an unproductive day. Do it and consider it your housework for the day.  Put on some nice clothes. Take the sweatpants  off even if you don't plan to go anywhere. Put on clothes that make you feel good. Take a look in the mirror so your brain recognizes the sweatpants have been replaced with clothes that make you look great. It's an instant confidence booster.  Wash the dishes. A pile of dirty dishes in the sink is a reflection of your mood. It's possible that if you wash up the dishes, your mood will follow suit. It's worth a try.  Cook a real meal. If you have the luxury to have a "do nothing" day, you have time to make a real meal for yourself. Make a meal that you love to eat. The process is good to get you out of the funk and the food will ensure you have more energy for tomorrow.  Write out your thoughts by hand. When you hand write, you stimulate your brain to focus  on the moment that you're in so make yourself comfortable and write whatever comes into your mind. Put those thoughts out on paper so they stop spinning around in your head. Those thoughts might be the very thing holding you down.  Follow up goal     Follow up:  Patient agrees to Care Plan and Follow-up.  Plan: The Managed Medicaid care management team will reach out to the patient again over the next 30 days.  Date/time of next scheduled Social Work care management/care coordination outreach:  10/16/22 at 6 am.  Eula Fried, BSW, MSW, [place] Medicaid LCSW [email or web address] Phone: [phone]

## 2022-10-05 NOTE — Patient Instructions (Signed)
Visit Information  Mr. Blinder was given information about Medicaid Managed Care team care coordination services as a part of their Healthy [facility] Medicaid benefit. Akxel Thiam Segal verbally consented to engagement with the [facility] Managed Care team.   If you are experiencing a medical emergency, please call 911 or report to your local emergency department or urgent care.   If you have a non-emergency medical problem during routine business hours, please contact your provider's office and ask to speak with a nurse.   For questions related to your Healthy [facility] health plan, please call: [phone] or visit the homepage here: [email or web address]  If you would like to schedule transportation through your Healthy [facility] plan, please call the following number at least 2 days in advance of your appointment: [phone]  For information about your ride after you set it up, call Ride Assist at [phone]. Use this number to activate a Will Call pickup, or if your transportation is late for a scheduled pickup. Use this number, too, if you need to make a change or cancel a previously scheduled reservation.  If you need transportation services right away, call [phone]. The after-hours call center is staffed 24 hours to handle ride assistance and urgent reservation requests (including discharges) 365 days a year. Urgent trips include sick visits, hospital discharge requests and life-sustaining treatment.  Call the [facility] at [phone], at any time, 24 hours a day, 7 days a week. If you are in danger or need immediate medical attention call 911.  If you would like help to quit smoking, call 1-800-QUIT-NOW 2818117922) OR Espaol: 1-855-Djelo-Ya HD:1601594) o para ms informacin haga clic aqu or Text READY to 200-400 to register via text   Following is a copy of your plan of care:  Care Plan : LCSW plan of care   Updates made by Greg Cutter, LCSW since 10/05/2022 12:00 AM     Problem: Addictive Behavior      Long-Range Goal: Addictive Behavior Managed   Start Date: 09/10/2022  Note:   Timeframe:  Long-Range Goal Priority:  Medium Start Date:  09/10/22 at                    Expected End Date: ongoing                 Follow Up Date- 10/16/22 at 54 am   - avoid negative self-talk - be honest with myself - begin personal counseling - check out recovery housing - exercise at least 2 to 3 times per week - identify a recovery coach - identify triggers for relapse - join AA (Alcoholics Anonymous) or NA (Narcotics Anonymous) - learn how to handle negative feelings without drugs or alcohol - make a list of pleasurable things to do without using alcohol or drugs - make a plan to deal with triggers like holidays and anniversaries - make a plan to fix broken relationships - make a plan to handle bad days - make a written relapse plan - return to AA (Alcoholics Anonymous) or NA (Narcotics Anonymous) - spend time or talk with a recovery coach or support person at least 2 or 3 times every week - spend time or talk with people who do not drink or use every day    Why is this important?   It is possible that after you stop drinking or using drugs that you may slip and use again. This is called a relapse.  A relapse does not  mean that you have failed. It means you have had one or a few bad days.  Make sure you know why it is important for you to stay sober and remind yourself often.  Once you figure out what triggered the relapse and take the steps to deal with it, you can be even more hopeful that recovery is possible.      10 LITTLE Things To Do When You're Feeling Too Down To Do Anything  Take a shower. Even if you plan to stay in all day long and not see a soul, take a shower. It takes the most effort to hop in to the shower but once you do, you'll feel immediate results. It will wake you up  and you'll be feeling much fresher (and cleaner too).  Brush and floss your teeth. Give your teeth a good brushing with a floss finish. It's a small task but it feels so good and you can check 'taking care of your health' off the list of things to do.  Do something small on your list. Most of [place] have some small thing we would like to get done (load of laundry, sew a button, email a friend). Doing one of these things will make you feel like you've accomplished something.  Drink water. Drinking water is easy right? It's also really beneficial for your health so keep a glass beside you all day and take sips often. It gives you energy and prevents you from boredom eating.  Do some floor exercises. The last thing you want to do is exercise but it might be just the thing you need the most. Keep it simple and do exercises that involve sitting or laying on the floor. Even the smallest of exercises release chemicals in the brain that make you feel good. Yoga stretches or core exercises are going to make you feel good with minimal effort.  Make your bed. Making your bed takes a few minutes but it's productive and you'll feel relieved when it's done. An unmade bed is a huge visual reminder that you're having an unproductive day. Do it and consider it your housework for the day.  Put on some nice clothes. Take the sweatpants off even if you don't plan to go anywhere. Put on clothes that make you feel good. Take a look in the mirror so your brain recognizes the sweatpants have been replaced with clothes that make you look great. It's an instant confidence booster.  Wash the dishes. A pile of dirty dishes in the sink is a reflection of your mood. It's possible that if you wash up the dishes, your mood will follow suit. It's worth a try.  Cook a real meal. If you have the luxury to have a "do nothing" day, you have time to make a real meal for yourself. Make a meal that you love to eat. The process is good  to get you out of the funk and the food will ensure you have more energy for tomorrow.  Write out your thoughts by hand. When you hand write, you stimulate your brain to focus on the moment that you're in so make yourself comfortable and write whatever comes into your mind. Put those thoughts out on paper so they stop spinning around in your head. Those thoughts might be the very thing holding you down.  Follow up goal

## 2022-10-08 ENCOUNTER — Inpatient Hospital Stay (HOSPITAL_COMMUNITY): Payer: Medicaid Other

## 2022-10-09 ENCOUNTER — Encounter (HOSPITAL_COMMUNITY): Payer: Medicaid Other | Admitting: Cardiology

## 2022-10-10 ENCOUNTER — Other Ambulatory Visit: Payer: Medicaid Other

## 2022-10-10 NOTE — Patient Outreach (Signed)
Medicaid Managed Care Social Work Note  10/10/2022 Name:  Craig Schmidt MRN:  [id number] DOB:  [birth year]  Craig Schmidt is an 52 y.o. year old male who is a primary patient of [name], NP.  The [facility] Managed Care Coordination team was consulted for assistance with:  Community Resources   Mr. Acocella was given information about Medicaid Managed Care Coordination team services today. Carver Fila Strupp Patient agreed to services and verbal consent obtained.  Engaged with patient  for by telephone forinitial visit in response to referral for case management and/or care coordination services.   Assessments/Interventions:  Review of past medical history, allergies, medications, health status, including review of consultants reports, laboratory and other test data, was performed as part of comprehensive evaluation and provision of chronic care management services.  SDOH: (Social Determinant of Health) assessments and interventions performed: SDOH Interventions    Flowsheet Row Patient Outreach Telephone from 10/05/2022 in [place] Telephone from 09/26/2022 in [place] and [place] Patient Outreach Telephone from 09/10/2022 in [place] Telephone from 09/07/2022 in [place] and [place] Telephone from 08/30/2022 in [place] and [place] Telephone from 08/22/2022 in [place] and [place]  SDOH Interventions        Housing Interventions -- Other (Comment)  [PEH] -- -- -- --  Transportation Interventions -- -- -- [employer] Given [employer] Given Contracted Vendor, Bus Pass Given  Financial Strain Interventions -- -- -- Other (Comment)  [gave jacket to help keep warm and dry] -- --  Stress Interventions Offered [employer], Provide Counseling -- [employer],  Provide Counseling -- -- --      BSW completed a telephone outreach with patient. He stated he would like some community resources such as clothing and housing. Patient stated he does not have transportation. BSW will mail patient resources for [employer]. Advanced Directives Status:  Not addressed in this encounter.  Care Plan                 Allergies  Allergen Reactions   Shellfish Allergy Anaphylaxis   Peanut-Containing Drug Products Itching    Medications Reviewed Today     Reviewed by [name], Paramedic (Certified Medical Assistant) on 09/06/22 at 1225  Med List Status: <None>   Medication Order Taking? Sig Documenting Provider Last Dose Status Informant  apixaban (ELIQUIS) 5 MG TABS tablet [id number] Yes Take 1 tablet (5 mg total) by mouth 2 (two) times daily. [name] D, NP Taking Active Self  atorvastatin (LIPITOR) 40 MG tablet [id number] Yes TAKE 1 TABLET(40 MG) BY MOUTH DAILY [name], MD Taking Active   carvedilol (COREG) 3.125 MG tablet [id number] Yes Take 1 tablet (3.125 mg total) by mouth 2 (two) times daily. [name], [name] Taking Active   clopidogrel (PLAVIX) 75 MG tablet [id number] Yes TAKE ONE TABLET BY MOUTH ONCE DAILY Clegg, [name], NP Taking Active   digoxin (LANOXIN) 0.125 MG tablet [id number] Yes TAKE ONE TABLET BY MOUTH ONCE DAILY Clegg, [name], NP Taking Active   famotidine (PEPCID) 20 MG tablet [id number] Yes TAKE ONE TABLET BY MOUTH ONCE DAILY Clegg, [name], NP Taking Active   Glycerin-Hypromellose-PEG 400 0.2-0.2-1 % SOLN [id number] Yes Place 1 drop into both eyes as needed. [name] D, NP Taking Active Self  isosorbide-hydrALAZINE (BIDIL) 20-37.5 MG tablet [id number] No  Take 0.5 tablets by mouth 3 (three) times daily. Take 1 & 1/2 tablets by mouth 3 times daily.  Patient not taking: Reported on 09/06/2022   [name], [name] Not Taking Active   JARDIANCE 10 MG TABS tablet [id number] Yes TAKE ONE TABLET BY MOUTH ONCE DAILY Clegg, Amy  D, NP Taking Active   lamoTRIgine (LAMICTAL) 25 MG tablet [id number]  Take 1 tablet (25 mg total) by mouth daily for 14 days. Then increase to 50 mg daily. [name], MD  Expired 07/10/22 2359   lamoTRIgine (LAMICTAL) 25 MG tablet [id number]  Take 2 tablets (50 mg total) by mouth daily for 14 days. [name], MD  Expired 07/12/22 2359   meloxicam (MOBIC) 7.5 MG tablet [id number] Yes TAKE ONE TABLET BY MOUTH ONCE DAILY [place], [name], [name] Taking Active   potassium chloride SA (KLOR-CON M) 20 MEQ tablet [id number] Yes Take 1 tablet (20 mEq total) by mouth daily. [place], [name], [name] Taking Active   sacubitril-valsartan (ENTRESTO) 24-26 MG [id number] Yes Take 1 tablet by mouth 2 (two) times daily. [place], [name], [name] Taking Active   spironolactone (ALDACTONE) 25 MG tablet [id number] Yes Take 0.5 tablets (12.5 mg total) by mouth at bedtime. [name], [name] Taking Active   torsemide (DEMADEX) 20 MG tablet [id number] Yes Take 1 tablet (20 mg total) by mouth daily. [name], [place] Taking Active   triamcinolone ointment (KENALOG) 0.5 % [id number] Yes APPLY TOPICALLY TO THE AFFECTED AREA TWICE DAILY [name], NP Taking Active             Patient Active Problem List   Diagnosis Date Noted   Chronic combined systolic and diastolic heart failure (HCC)    ST elevation myocardial infarction (STEMI) of inferolateral wall, subsequent episode of care ([place]) 12/19/2021   ST elevation myocardial infarction (STEMI) ([place])    Ischemic cardiomyopathy    Cocaine abuse ([place])    Smoker    Alcohol abuse    Acute ischemic stroke ([place]) 09/08/2021   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   Tear of MCL (medial collateral ligament) of knee 05/20/2015   Protein-calorie malnutrition, severe 05/17/2015   Lactic acidosis 05/15/2015   Left knee pain 05/15/2015   Lung nodules 05/15/2015   Hypoglycemia 05/14/2015   Hypothermia 05/14/2015   Acute encephalopathy 05/14/2015    Cocaine abuse with intoxication ([place]) 05/14/2015   Alcohol intoxication in active alcoholic ([place]) [id number]   Sepsis ([place]) 05/14/2015   Homeless 05/14/2015    Conditions to be addressed/monitored per PCP order:   community resources  There are no care plans that you recently modified to display for this patient.   Follow up:  Patient agrees to Care Plan and Follow-up.  Plan: The Managed Medicaid care management team will reach out to the patient again over the next 15 days.  Date/time of next scheduled Social Work care management/care coordination outreach:  10/31/22  Mickel Fuchs, Arita Miss, [place] Medicaid Team  [phone]

## 2022-10-10 NOTE — Patient Instructions (Signed)
Visit Information  Craig Schmidt was given information about Medicaid Managed Care team care coordination services as a part of their Healthy [facility] Medicaid benefit. Craig Schmidt verbally consented to engagement with the [facility] Managed Care team.   If you are experiencing a medical emergency, please call 911 or report to your local emergency department or urgent care.   If you have a non-emergency medical problem during routine business hours, please contact your provider's office and ask to speak with a nurse.   For questions related to your Healthy [facility] health plan, please call: [phone] or visit the homepage here: [email or web address]  If you would like to schedule transportation through your Healthy [facility] plan, please call the following number at least 2 days in advance of your appointment: [phone]  For information about your ride after you set it up, call Ride Assist at [phone]. Use this number to activate a Will Call pickup, or if your transportation is late for a scheduled pickup. Use this number, too, if you need to make a change or cancel a previously scheduled reservation.  If you need transportation services right away, call [phone]. The after-hours call center is staffed 24 hours to handle ride assistance and urgent reservation requests (including discharges) 365 days a year. Urgent trips include sick visits, hospital discharge requests and life-sustaining treatment.  Call the [facility] at [phone], at any time, 24 hours a day, 7 days a week. If you are in danger or need immediate medical attention call 911.  If you would like help to quit smoking, call 1-800-QUIT-NOW 310-571-6316) OR Espaol: 1-855-Djelo-Ya HD:1601594) o para ms informacin haga clic aqu or Text READY to 200-400 to register via text  Mr. Deman - following are the goals we discussed in your visit today:    Goals Addressed   None      Social Worker will follow up on 10/31/22.   Craig Schmidt, Craig Schmidt, Craig Schmidt Managed Medicaid Team  [phone]   Following is a copy of your plan of care:  There are no care plans that you recently modified to display for this patient.

## 2022-10-12 ENCOUNTER — Other Ambulatory Visit: Payer: Medicaid Other | Admitting: *Deleted

## 2022-10-12 NOTE — Patient Instructions (Signed)
Visit Information  Mr. Carver Fila Peregrina  - as a part of your Medicaid benefit, you are eligible for care management and care coordination services at no cost or copay. I was unable to reach you by phone today but would be happy to help you with your health related needs. Please feel free to call me @ [phone].   A member of the Managed Medicaid care management team will reach out to you again over the next 7 days.   Lurena Joiner RN, BSN [facility] RN Care Coordinator [phone]

## 2022-10-12 NOTE — Patient Outreach (Signed)
  Medicaid Managed Care   Unsuccessful Attempt Note   10/12/2022 Name: Craig Schmidt MRN: [id number] DOB: March 16, 1971  Referred by: [name], NP Reason for referral : High Risk Managed Medicaid (Unsuccessful RNCM initial telephone outreach)   An unsuccessful telephone outreach was attempted today. The patient was referred to the case management team for assistance with care management and care coordination.    Follow Up Plan: The Managed Medicaid care management team will reach out to the patient again over the next 7 days.    Lurena Joiner RN, BSN [place] [facility] RN Care Coordinator [phone]

## 2022-10-16 ENCOUNTER — Other Ambulatory Visit: Payer: Medicaid Other | Admitting: Licensed Clinical Social Worker

## 2022-10-16 NOTE — Patient Instructions (Signed)
Visit Information  Craig Schmidt was given information about Medicaid Managed Care team care coordination services as a part of their Healthy [facility] Medicaid benefit. Craig Schmidt verbally consented to engagement with the [facility] Managed Care team.   If you are experiencing a medical emergency, please call 911 or report to your local emergency department or urgent care.   If you have a non-emergency medical problem during routine business hours, please contact your provider's office and ask to speak with a nurse.   For questions related to your Healthy [facility] health plan, please call: [phone] or visit the homepage here: [email or web address]  If you would like to schedule transportation through your Healthy [facility] plan, please call the following number at least 2 days in advance of your appointment: [phone]  For information about your ride after you set it up, call Ride Assist at [phone]. Use this number to activate a Will Call pickup, or if your transportation is late for a scheduled pickup. Use this number, too, if you need to make a change or cancel a previously scheduled reservation.  If you need transportation services right away, call [phone]. The after-hours call center is staffed 24 hours to handle ride assistance and urgent reservation requests (including discharges) 365 days a year. Urgent trips include sick visits, hospital discharge requests and life-sustaining treatment.  Call the [place] at [phone], at any time, 24 hours a day, 7 days a week. If you are in danger or need immediate medical attention call 911.  If you would like help to quit smoking, call 1-800-QUIT-NOW 732-017-3750) OR Espaol: 1-855-Djelo-Ya HD:1601594) o para ms informacin haga clic aqu or Text READY to 200-400 to register via text    Following is a copy of your plan of care:  Care Plan : LCSW plan of  care  Updates made by Greg Cutter, LCSW since 10/16/2022 12:00 AM     Problem: Addictive Behavior      Long-Range Goal: Addictive Behavior and Stress Symptoms Managed   Start Date: 09/10/2022  Note:   Timeframe:  Long-Range Goal Priority:  Medium Start Date:  09/10/22 at                    Expected End Date: ongoing                 Follow Up Date- 11/01/22 at 9 am   - avoid negative self-talk - be honest with myself - begin personal counseling - check out recovery housing - exercise at least 2 to 3 times per week - identify a recovery coach - identify triggers for relapse - join AA (Alcoholics Anonymous) or NA (Narcotics Anonymous) - learn how to handle negative feelings without drugs or alcohol - make a list of pleasurable things to do without using alcohol or drugs - make a plan to deal with triggers like holidays and anniversaries - make a plan to fix broken relationships - make a plan to handle bad days - make a written relapse plan - return to AA (Alcoholics Anonymous) or NA (Narcotics Anonymous) - spend time or talk with a recovery coach or support person at least 2 or 3 times every week - spend time or talk with people who do not drink or use every day    Why is this important?   It is possible that after you stop drinking or using drugs that you may slip and use again. This is called a relapse.  A relapse does not mean that you have failed. It means you have had one or a few bad days.  Make sure you know why it is important for you to stay sober and remind yourself often.  Once you figure out what triggered the relapse and take the steps to deal with it, you can be even more hopeful that recovery is possible.       - avoid negative self-talk - develop a personal safety plan - develop a plan to deal with triggers like holidays, anniversaries - exercise at least 2 to 3 times per week - have a plan for how to handle bad days - journal feelings and what helps to  feel better or worse - spend time or talk with others at least 2 to 3 times per week - spend time or talk with others every day - watch for early signs of feeling worse - write in journal every day    Why is this important?   Keeping track of your progress will help your treatment team find the right mix of medicine and therapy for you.  Write in your journal every day.  Day-to-day changes in depression symptoms are normal. It may be more helpful to check your progress at the end of each week instead of every day.       Current barriers:   Chronic Mental Health needs related to Depression and Alcohol/Substance Abuse 30 plus years of homelessness, patient lives in a tent Low support network Currently unable to independently self manage needs related to mental health conditions. Knowledge Deficits related to short term plan for care coordination  needs and long term plans for chronic disease management Lacks knowledge of where and how to connect for mental health support Needs Support, Education, and Care Coordination in order to meet unmet need.   Clinical Goal(s): Over the next 120 days, patient will work with SW to reduce or manage symptoms of cravings and anxiety/depression until connected for ongoing counseling or finds medication that helps his symptom relief.     Patient Goals/Self-Care Activities: Over the next 120 days Implement interventions discussed today to decreases alcohol usage, symptoms of anxiety and depression and increase knowledge and/or ability of: coping skills, healthy habits, self-management skills, and stress reduction.  Alcohol Use Education   Score 0 = Abstainers Score 8-19 = Unhealthy/High Risk Drinkers  Score 1-7 = Low Risk Drinkers Score 20+ = Probable Alcohol Dependence  High Scores (20+) on the Alcohol Use Identification Test Consider becoming involved in a structured program.  You should stop drinking if: You have tried to cut down before but have not  been successful, or  You suffer from morning shakes during a heavy drinking period, or You have high blood pressure, or You are pregnant, or You have liver disease, or You are taking medicines that react with alcohol, or Your alcohol use is affecting your social relationships, or You have legal consequences like DUIs, or You call in sick to work, or You cannot take care of our children, or Someone close to you says you drink too much   How Much Alcohol is a Drink: Beer: 12 oz. = 1 drink 16 oz. = 1.3 drinks 22 oz. = 2 drinks 40 oz. = 3.3 drinks  Wine: 5 oz. = 1 drink 740 mL (25 oz.) bottle = 5 drinks Malt Liquor: 12 oz. = 1.5 drinks 16 oz. = 2 drinks 22 oz. = 2.5 drinks 40 oz. = 4.5 drinks  80-Proof  Spirits - Hard Liquor: 1 shot = 1 drink 1 mixed drink = number of shots Can equal 1-3 drinks  What is Low-risk Drinking? Have no more than 2 drinks of alcohol per day Drink no more than 5 days per week Do not drink alcohol drink alcohol when: You drive or operate machinery You are pregnant or breast feeding You are taking medications that interact with alcohol You have medical conditions made worse with alcohol You can stop or control your drinking      Identify Your Triggers for Drinking Parties Particular People Feeling lonely Feeling tense Family problems Feeling sad Feeling happy Feeling bored After work Problems sleeping Criticism Feelings of failure After being paid When others are drinking In bars When out for dinner After arguments Weekends Feeling restless Being in pain   Effects of High-Risk Drinking To the Brain: Aggressive, irrational behavior Arguments, violence Depression, nervousness Alcohol dependence, memory loss To the Nervous System: Trembling hands, tingling fingers Numbness, painful nerves Impaired sensation leading to falls Numb tingling toes To Your Lifestyle: Social, legal, medical problems Domestic trouble/relationship  loss Job loss & financial problems Shortened life span Accidents and death from drunk driving   To the Face: Premature aging, drinker's nose Cancer of the throat & mouth To the Body: Frequent cold Reduced resistance to infection Increased risk of pneumonia Weakness of heart muscle Heart failure, anemia Impaired blood clotting Breast cancer Vitamin deficiency, bleeding Severe Inflammation of the stomach Vomiting, diarrhea, malnutrition Ulcer, inflammation of the pancreas Impaired sexual performance Birth defects, including deformities, retardation, and low birthweight  Ways to Cope Without Drinking Go home if you tend to drink after work Find another activity Switch to nonalcoholic beverages Change friends Join a Financial planner Visit relatives Plan/take a trip Go for a walk Take up a hobby Listen to music Talk to a friend Reading What would you do if you had no worries about failing?        Good Reasons for Drinking Less I will live longer - probably 8-10 years. I will sleep better. I will be happier. I will save a lot of money My relationships will improve. I will stay younger for longer. I will achieve more in my life There will be a greater chance that I will survive to a healthy old age with no premature damage to my brain.  I will be better at my job. I will be less likely to feel depressed and commit suicide (6 times less likely). I will be less likely to die of heart disease or cancer. Other people will respect me I will be less likely to get into trouble with the police. The possibility that I will die of liver disease will be dramatically reduced (12 times less likely). It will be less likely that I will die in a car accident (3 times less likely).   Strategies for Cutting Down Keep Track.  Find a way to keep track of how much you drink.  If you make a note of each drink before you drink it, this will help slow you down. Count and Measure.  Know  the standard drink sizes.  Ask the bartender or server about the amount of alcohol in a mixed drink. Set Goals.  Decide how many days a week you will drink and how many drinks each day. Pace and Space.  When you do drink, pace yourself.  Have no more than one drink with alcohol per hour.  Alternate "drink spacers" non-alcoholic drinks such as  water, soda, or juice with drinks containing alcohol. Include Food.  Don't drink on an empty stomach.  Have some food so the alcohol will be absorbed more slowly into your system.  Avoid Triggers.  Avoid people, places, or activities that have led to drinking in the past.  Certain times of day or feelings may also be triggers.  Make a plan so you will know what you can do instead of drinking. Plan to Handle Urges.  When an urge hits, consider these options:  Remind yourself of your reasons for changing.  Or talk it through with someone you trust. Or get involved with a healthy, distracting activity.  Or, "urge surf" - instead of fighting the feeling, accept it and ride it out, knowing it will soon crest like a wave and pass. Know Your "No".  Have a polite, convincing "no thanks" for those times when you may be offered a drink and don't want one.  The faster you can say no to these offers, the less likely you are to give in.  If you hesitate, it allows you time to think of excuses to go along.       10 LITTLE Things To Do When You're Feeling Too Down To Do Anything  Take a shower. Even if you plan to stay in all day long and not see a soul, take a shower. It takes the most effort to hop in to the shower but once you do, you'll feel immediate results. It will wake you up and you'll be feeling much fresher (and cleaner too).  Brush and floss your teeth. Give your teeth a good brushing with a floss finish. It's a small task but it feels so good and you can check 'taking care of your health' off the list of things to do.  Do something small on your list. Most of  [place] have some small thing we would like to get done (load of laundry, sew a button, email a friend). Doing one of these things will make you feel like you've accomplished something.  Drink water. Drinking water is easy right? It's also really beneficial for your health so keep a glass beside you all day and take sips often. It gives you energy and prevents you from boredom eating.  Do some floor exercises. The last thing you want to do is exercise but it might be just the thing you need the most. Keep it simple and do exercises that involve sitting or laying on the floor. Even the smallest of exercises release chemicals in the brain that make you feel good. Yoga stretches or core exercises are going to make you feel good with minimal effort.  Make your bed. Making your bed takes a few minutes but it's productive and you'll feel relieved when it's done. An unmade bed is a huge visual reminder that you're having an unproductive day. Do it and consider it your housework for the day.  Put on some nice clothes. Take the sweatpants off even if you don't plan to go anywhere. Put on clothes that make you feel good. Take a look in the mirror so your brain recognizes the sweatpants have been replaced with clothes that make you look great. It's an instant confidence booster.  Wash the dishes. A pile of dirty dishes in the sink is a reflection of your mood. It's possible that if you wash up the dishes, your mood will follow suit. It's worth a try.  Cook a real meal. If you  have the luxury to have a "do nothing" day, you have time to make a real meal for yourself. Make a meal that you love to eat. The process is good to get you out of the funk and the food will ensure you have more energy for tomorrow.  Write out your thoughts by hand. When you hand write, you stimulate your brain to focus on the moment that you're in so make yourself comfortable and write whatever comes into your mind. Put those thoughts out  on paper so they stop spinning around in your head. Those thoughts might be the very thing holding you down.  Follow up goal

## 2022-10-16 NOTE — Patient Outreach (Signed)
Medicaid Managed Care Social Work Note  10/16/2022 Name:  Craig Schmidt MRN:  [id number] DOB:  22-May-1971  Craig Schmidt is an 52 y.o. year old male who is a primary Schmidt of [name], NP.  The Medicaid Managed Care Coordination team was consulted for assistance with:  Craig Schmidt and Resources  Craig Schmidt was given information about Medicaid Managed Care Coordination team services today. Craig Schmidt Schmidt agreed to services and verbal consent obtained.  Engaged with Schmidt  for by telephone forfollow up visit in response to referral for case management and/or care coordination services.   Assessments/Interventions:  Review of past medical history, allergies, medications, health status, including review of consultants reports, laboratory and other test data, was performed as part of comprehensive evaluation and provision of chronic care management services.  SDOH: (Social Determinant of Health) assessments and interventions performed: SDOH Interventions    Flowsheet Row Schmidt Outreach Telephone from 10/16/2022 in [place] Schmidt Outreach Telephone from 10/05/2022 in [place] Telephone from 09/26/2022 in [place] [facility] [place] Schmidt Outreach Telephone from 09/10/2022 in [place] Telephone from 09/07/2022 in [place] [facility] [place] Telephone from 08/30/2022 in [place] [facility] [place]  SDOH Interventions        Housing Interventions -- -- Other (Comment)  [PEH] -- -- --  Transportation Interventions -- -- -- -- [employer] Given [employer] Given  Financial Strain Interventions -- -- -- -- Other (Comment)  [gave jacket to help keep warm and dry] --  Stress Interventions Offered [employer], Provide Counseling Offered [employer], Provide  Counseling -- Offered [employer], Provide Counseling -- --       Advanced Directives Status:  See Care Plan for related entries.  Care Plan                 Allergies  Allergen Reactions   Shellfish Allergy Anaphylaxis   Peanut-Containing Drug Products Itching    Medications Reviewed Today     Reviewed by [name], Paramedic (Certified Medical Assistant) on 09/06/22 at 1225  Med List Status: <None>   Medication Order Taking? Sig Documenting Provider Last Dose Status Informant  apixaban (ELIQUIS) 5 MG TABS tablet [id number] Yes Take 1 tablet (5 mg total) by mouth 2 (two) times daily. [name] D, NP Taking Active Self  atorvastatin (LIPITOR) 40 MG tablet [id number] Yes TAKE 1 TABLET(40 MG) BY MOUTH DAILY [name], MD Taking Active   carvedilol (COREG) 3.125 MG tablet [id number] Yes Take 1 tablet (3.125 mg total) by mouth 2 (two) times daily. [name], [name] Taking Active   clopidogrel (PLAVIX) 75 MG tablet [id number] Yes TAKE ONE TABLET BY MOUTH ONCE DAILY Clegg, [name], NP Taking Active   digoxin (LANOXIN) 0.125 MG tablet [id number] Yes TAKE ONE TABLET BY MOUTH ONCE DAILY Clegg, [name], NP Taking Active   famotidine (PEPCID) 20 MG tablet [id number] Yes TAKE ONE TABLET BY MOUTH ONCE DAILY Clegg, [name], NP Taking Active   Glycerin-Hypromellose-PEG 400 0.2-0.2-1 % SOLN [id number] Yes Place 1 drop into both eyes as needed. Clegg, [name], NP Taking Active Self  isosorbide-hydrALAZINE (BIDIL) 20-37.5 MG tablet [id number] No Take 0.5 tablets by mouth 3 (three) times daily. Take 1 & 1/2 tablets by mouth 3 times daily.  Schmidt not taking: Reported on 09/06/2022   Allena Katz  M, [name] Not Taking Active   JARDIANCE 10 MG TABS tablet [id number] Yes TAKE ONE TABLET BY MOUTH ONCE DAILY Clegg, [name], NP Taking Active   lamoTRIgine (LAMICTAL) 25 MG tablet [id number]  Take 1 tablet (25 mg total) by mouth daily for 14 days. Then increase to 50 mg daily. [name], MD   Expired 07/10/22 2359   lamoTRIgine (LAMICTAL) 25 MG tablet [id number]  Take 2 tablets (50 mg total) by mouth daily for 14 days. [name], MD  Expired 07/12/22 2359   meloxicam (MOBIC) 7.5 MG tablet [id number] Yes TAKE ONE TABLET BY MOUTH ONCE DAILY [place], [name], [name] Taking Active   potassium chloride SA (KLOR-CON M) 20 MEQ tablet [id number] Yes Take 1 tablet (20 mEq total) by mouth daily. [place], [name], [name] Taking Active   sacubitril-valsartan (ENTRESTO) 24-26 MG [id number] Yes Take 1 tablet by mouth 2 (two) times daily. [place], [name], [name] Taking Active   spironolactone (ALDACTONE) 25 MG tablet [id number] Yes Take 0.5 tablets (12.5 mg total) by mouth at bedtime. [name], [name] Taking Active   torsemide (DEMADEX) 20 MG tablet [id number] Yes Take 1 tablet (20 mg total) by mouth daily. [name],  Taking Active   triamcinolone ointment (KENALOG) 0.5 % [id number] Yes APPLY TOPICALLY TO THE AFFECTED AREA TWICE DAILY [name], NP Taking Active             Schmidt Active Problem List   Diagnosis Date Noted   Chronic combined systolic and diastolic heart failure (HCC)    ST elevation myocardial infarction (STEMI) of inferolateral wall, subsequent episode of care ([place]) 12/19/2021   ST elevation myocardial infarction (STEMI) (HCC)    Ischemic cardiomyopathy    Cocaine abuse ([place])    Smoker    Alcohol abuse    Acute ischemic stroke ([place]) 09/08/2021   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   Tear of MCL (medial collateral ligament) of knee 05/20/2015   Protein-calorie malnutrition, severe 05/17/2015   Lactic acidosis 05/15/2015   Left knee pain 05/15/2015   Lung nodules 05/15/2015   Hypoglycemia 05/14/2015   Hypothermia 05/14/2015   Acute encephalopathy 05/14/2015   Cocaine abuse with intoxication ([place]) 05/14/2015   Alcohol intoxication in active alcoholic ([place]) [id number]   Sepsis ([place]) 05/14/2015   Homeless 05/14/2015     Conditions to be addressed/monitored per PCP order:   Stress Management   Care Plan : LCSW plan of care  Updates made by Greg Cutter, LCSW since 10/16/2022 12:00 AM     Problem: Addictive Behavior      Long-Range Goal: Addictive Behavior and Stress Symptoms Managed   Start Date: 09/10/2022  Note:   Timeframe:  Long-Range Goal Priority:  Medium Start Date:  09/10/22 at                    Expected End Date: ongoing                 Follow Up Date- 11/01/22 at 9 am   - avoid negative self-talk - be honest with myself - begin personal counseling - check out recovery housing - exercise at least 2 to 3 times per week - identify a recovery coach - identify triggers for relapse - join AA (Alcoholics Anonymous) or NA (Narcotics Anonymous) - learn how to handle negative feelings without drugs or alcohol - make a list of pleasurable things to do without using alcohol or drugs - make a plan to deal  with triggers like holidays and anniversaries - make a plan to fix broken relationships - make a plan to handle bad days - make a written relapse plan - return to AA (Alcoholics Anonymous) or NA (Narcotics Anonymous) - spend time or talk with a recovery coach or support person at least 2 or 3 times every week - spend time or talk with people who do not drink or use every day    Why is this important?   It is possible that after you stop drinking or using drugs that you may slip and use again. This is called a relapse.  A relapse does not mean that you have failed. It means you have had one or a few bad days.  Make sure you know why it is important for you to stay sober and remind yourself often.  Once you figure out what triggered the relapse and take the steps to deal with it, you can be even more hopeful that recovery is possible.       - avoid negative self-talk - develop a personal safety plan - develop a plan to deal with triggers like holidays, anniversaries - exercise at  least 2 to 3 times per week - have a plan for how to handle bad days - journal feelings and what helps to feel better or worse - spend time or talk with others at least 2 to 3 times per week - spend time or talk with others every day - watch for early signs of feeling worse - write in journal every day    Why is this important?   Keeping track of your progress will help your treatment team find the right mix of medicine and therapy for you.  Write in your journal every day.  Day-to-day changes in depression symptoms are normal. It may be more helpful to check your progress at the end of each week instead of every day.       Current barriers:   Chronic Mental Health needs related to Depression and Alcohol/Substance Abuse 30 plus years of homelessness, Schmidt lives in a tent Low support network Currently unable to independently self manage needs related to mental health conditions. Knowledge Deficits related to short term plan for care coordination  needs and long term plans for chronic disease management Lacks knowledge of where and how to connect for mental health support Needs Support, Education, and Care Coordination in order to meet unmet need.   Clinical Goal(s): Over the next 120 days, Schmidt will work with SW to reduce or manage symptoms of cravings and anxiety/depression until connected for ongoing counseling or finds medication that helps his symptom relief.   Clinical Interventions:  Assessed Schmidt's previous treatment, needs, coping skills, current treatment, support system and barriers to care Schmidt interviewed and appropriate assessments performed or reviewed: brief mental health assessment;Suicidal Ideation/Homicidal Ideation: No Provided basic mental health support, education and interventions  Schmidt reports that he is has nursing and social work needs. He reports that he is looking for stable housing, transportation resources, dental resources, mental health  resources and nursing case management for CHF due to recent hospitalization.  Relapse prevention education provided to Schmidt on 09/10/22 Discussed several options for long term counseling based on need and insurance.  Reviewed mental health medications with Schmidt prescribed by PCP and discussed compliance  [facility] LCSW educated Schmidt on medication therapy that can help with alcohol triggers. Other interventions include: Motivational Interviewing Solution-Focused Strategies Mindfulness or Insurance risk surveyor care  team collaboration (see longitudinal plan of care) [facility] LCSW educated Schmidt on healthy coping skills to implement into is daily routine to combat alcohol substance cravings and anxiety and depressive symptoms once they arise. [facility] LCSW made referral to [place] and BSW on 09/10/22. Referral made to [facility] for individual counseling on 09/10/22 with Schmidt's consent. Schmidt has two upcoming mental health appointments in March at [facility]. Schmidt physically wrote down resource contact information. [facility] LCSW 10/05/22 update- Schmidt was a no show for his scheduled psychiatry appointment at [facility] even though he was reminded of this appointment several times by [facility] LCSW. Schmidt reports that he will contact [facility] and reschedule visit or utilize their walk in clinic. Schmidt has a scheduled counseling appointment at [facility] on 10/17/22. Schmidt was provided [facility] walk in clinic information. 10/16/22 update- Schmidt has his initial counseling appointment tomorrow and was made aware that he already has one no show with [facility] and will be discharged from program if he has another no show. He reports that he will be able to go tomorrow. [facility] LCSW educated him on their enrollment process. Emotional support provided for ongoing stressors that he faces in managing his health.  Past update, referral from PCP states-Needs as much support as is available. Chronically homeless with wife and dog-  currently in hotel being paid for through [facility] and supposedly working with them to assess for other options but nothing yet. Has SSI income. Has chronic mental health and competency concerns which make him difficult to work with somewhat. Has issues with transport to appts due to lack of stable address so hard to set up medicaid transport. Needs chronic case management to help reduce risk of admission is a high ED utilizer. Being seen by our Community Paramedic at this time but he will likely not be able to remain on program for much longer as he is not [facility] so want to help find support for him following DC. [facility] team is now involved with Schmidt.    Schmidt Goals/Self-Care Activities: Over the next 120 days Implement interventions discussed today to decreases alcohol usage, symptoms of anxiety and depression and increase knowledge and/or ability of: coping skills, healthy habits, self-management skills, and stress reduction.  Alcohol Use Education   Score 0 = Abstainers Score 8-19 = Unhealthy/High Risk Drinkers  Score 1-7 = Low Risk Drinkers Score 20+ = Probable Alcohol Dependence  High Scores (20+) on the Alcohol Use Identification Test Consider becoming involved in a structured program.  You should stop drinking if: You have tried to cut down before but have not been successful, or  You suffer from morning shakes during a heavy drinking period, or You have high blood pressure, or You are pregnant, or You have liver disease, or You are taking medicines that react with alcohol, or Your alcohol use is affecting your social relationships, or You have legal consequences like DUIs, or You call in sick to work, or You cannot take care of our children, or Someone close to you says you drink too much   How Much Alcohol is a Drink: Beer: 12 oz. = 1 drink 16 oz. = 1.3 drinks 22 oz. = 2 drinks 40 oz. = 3.3 drinks  Wine: 5 oz. = 1 drink 740 mL (25 oz.) bottle = 5 drinks Malt Liquor: 12 oz. = 1.5  drinks 16 oz. = 2 drinks 22 oz. = 2.5 drinks 40 oz. = 4.5 drinks  80-Proof Spirits - Hard Liquor: 1 shot = 1 drink 1  mixed drink = number of shots Can equal 1-3 drinks  What is Low-risk Drinking? Have no more than 2 drinks of alcohol per day Drink no more than 5 days per week Do not drink alcohol drink alcohol when: You drive or operate machinery You are pregnant or breast feeding You are taking medications that interact with alcohol You have medical conditions made worse with alcohol You can stop or control your drinking      Identify Your Triggers for Drinking Parties Particular People Feeling lonely Feeling tense Family problems Feeling sad Feeling happy Feeling bored After work Problems sleeping Criticism Feelings of failure After being paid When others are drinking In bars When out for dinner After arguments Weekends Feeling restless Being in pain   Effects of High-Risk Drinking To the Brain: Aggressive, irrational behavior Arguments, violence Depression, nervousness Alcohol dependence, memory loss To the Nervous System: Trembling hands, tingling fingers Numbness, painful nerves Impaired sensation leading to falls Numb tingling toes To Your Lifestyle: Social, legal, medical problems Domestic trouble/relationship loss Job loss & financial problems Shortened life span Accidents and death from drunk driving   To the Face: Premature aging, drinker's nose Cancer of the throat & mouth To the Body: Frequent cold Reduced resistance to infection Increased risk of pneumonia Weakness of heart muscle Heart failure, anemia Impaired blood clotting Breast cancer Vitamin deficiency, bleeding Severe Inflammation of the stomach Vomiting, diarrhea, malnutrition Ulcer, inflammation of the pancreas Impaired sexual performance Birth defects, including deformities, retardation, and low birthweight  Ways to Cope Without Drinking Go home if you tend to drink  after work Find another activity Switch to nonalcoholic beverages Change friends Join a Financial planner Visit relatives Plan/take a trip Go for a walk Take up a hobby Listen to music Talk to a friend Reading What would you do if you had no worries about failing?        Good Reasons for Drinking Less I will live longer - probably 8-10 years. I will sleep better. I will be happier. I will save a lot of money My relationships will improve. I will stay younger for longer. I will achieve more in my life There will be a greater chance that I will survive to a healthy old age with no premature damage to my brain.  I will be better at my job. I will be less likely to feel depressed and commit suicide (6 times less likely). I will be less likely to die of heart disease or cancer. Other people will respect me I will be less likely to get into trouble with the police. The possibility that I will die of liver disease will be dramatically reduced (12 times less likely). It will be less likely that I will die in a car accident (3 times less likely).   Strategies for Cutting Down Keep Track.  Find a way to keep track of how much you drink.  If you make a note of each drink before you drink it, this will help slow you down. Count and Measure.  Know the standard drink sizes.  Ask the bartender or server about the amount of alcohol in a mixed drink. Set Goals.  Decide how many days a week you will drink and how many drinks each day. Pace and Space.  When you do drink, pace yourself.  Have no more than one drink with alcohol per hour.  Alternate "drink spacers" non-alcoholic drinks such as water, soda, or juice with drinks containing alcohol. Include Food.  Don't drink on an empty stomach.  Have some food so the alcohol will be absorbed more slowly into your system.  Avoid Triggers.  Avoid people, places, or activities that have led to drinking in the past.  Certain times of day or feelings may  also be triggers.  Make a plan so you will know what you can do instead of drinking. Plan to Handle Urges.  When an urge hits, consider these options:  Remind yourself of your reasons for changing.  Or talk it through with someone you trust. Or get involved with a healthy, distracting activity.  Or, "urge surf" - instead of fighting the feeling, accept it and ride it out, knowing it will soon crest like a wave and pass. Know Your "No".  Have a polite, convincing "no thanks" for those times when you may be offered a drink and don't want one.  The faster you can say no to these offers, the less likely you are to give in.  If you hesitate, it allows you time to think of excuses to go along.       10 LITTLE Things To Do When You're Feeling Too Down To Do Anything  Take a shower. Even if you plan to stay in all day long and not see a soul, take a shower. It takes the most effort to hop in to the shower but once you do, you'll feel immediate results. It will wake you up and you'll be feeling much fresher (and cleaner too).  Brush and floss your teeth. Give your teeth a good brushing with a floss finish. It's a small task but it feels so good and you can check 'taking care of your health' off the list of things to do.  Do something small on your list. Most of [place] have some small thing we would like to get done (load of laundry, sew a button, email a friend). Doing one of these things will make you feel like you've accomplished something.  Drink water. Drinking water is easy right? It's also really beneficial for your health so keep a glass beside you all day and take sips often. It gives you energy and prevents you from boredom eating.  Do some floor exercises. The last thing you want to do is exercise but it might be just the thing you need the most. Keep it simple and do exercises that involve sitting or laying on the floor. Even the smallest of exercises release chemicals in the brain that make you  feel good. Yoga stretches or core exercises are going to make you feel good with minimal effort.  Make your bed. Making your bed takes a few minutes but it's productive and you'll feel relieved when it's done. An unmade bed is a huge visual reminder that you're having an unproductive day. Do it and consider it your housework for the day.  Put on some nice clothes. Take the sweatpants off even if you don't plan to go anywhere. Put on clothes that make you feel good. Take a look in the mirror so your brain recognizes the sweatpants have been replaced with clothes that make you look great. It's an instant confidence booster.  Wash the dishes. A pile of dirty dishes in the sink is a reflection of your mood. It's possible that if you wash up the dishes, your mood will follow suit. It's worth a try.  Cook a real meal. If you have the luxury to have a "do nothing" day, you  have time to make a real meal for yourself. Make a meal that you love to eat. The process is good to get you out of the funk and the food will ensure you have more energy for tomorrow.  Write out your thoughts by hand. When you hand write, you stimulate your brain to focus on the moment that you're in so make yourself comfortable and write whatever comes into your mind. Put those thoughts out on paper so they stop spinning around in your head. Those thoughts might be the very thing holding you down.  Follow up goal     Follow up:  Schmidt agrees to Care Plan and Follow-up.  Plan: The Managed Medicaid care management team will reach out to the Schmidt again over the next 30 days.  Date/time of next scheduled Social Work care management/care coordination outreach:  11/01/22 at 9 am.  Eula Fried, BSW, MSW, [place] Medicaid LCSW [email or web address] Phone: [phone]

## 2022-10-17 ENCOUNTER — Ambulatory Visit (HOSPITAL_COMMUNITY): Payer: Medicaid Other | Admitting: Mental Health

## 2022-10-26 ENCOUNTER — Other Ambulatory Visit: Payer: Medicaid Other | Admitting: *Deleted

## 2022-10-26 ENCOUNTER — Encounter: Payer: Self-pay | Admitting: *Deleted

## 2022-10-26 NOTE — Patient Instructions (Signed)
Visit Information  Mr. Abellera was given information about Medicaid Managed Care team care coordination services as a part of their Healthy [facility] Medicaid benefit. Scorpio Easterly Homeyer verbally consented to engagement with the [facility] Managed Care team.   If you are experiencing a medical emergency, please call 911 or report to your local emergency department or urgent care.   If you have a non-emergency medical problem during routine business hours, please contact your provider's office and ask to speak with a nurse.   For questions related to your Healthy [facility] health plan, please call: [phone] or visit the homepage here: [email or web address]  If you would like to schedule transportation through your Healthy [facility] plan, please call the following number at least 2 days in advance of your appointment: [phone]  For information about your ride after you set it up, call Ride Assist at [phone]. Use this number to activate a Will Call pickup, or if your transportation is late for a scheduled pickup. Use this number, too, if you need to make a change or cancel a previously scheduled reservation.  If you need transportation services right away, call [phone]. The after-hours call center is staffed 24 hours to handle ride assistance and urgent reservation requests (including discharges) 365 days a year. Urgent trips include sick visits, hospital discharge requests and life-sustaining treatment.  Call the [facility] Line at [phone], at any time, 24 hours a day, 7 days a week. If you are in danger or need immediate medical attention call 911.  If you would like help to quit smoking, call 1-800-QUIT-NOW (952-546-8121) OR Espaol: 1-855-Djelo-Ya (3-785-885-0277) o para ms informacin haga clic aqu or Text READY to 412-878 to register via text  Mr. Salt,   Please see education materials related to HF provided by  MyChart link. 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   Patient verbalizes understanding of instructions and care plan provided today and agrees to view in MyChart. Active MyChart status and patient understanding of how to access instructions and care plan via MyChart confirmed with patient.     Telephone follow up appointment with Managed Medicaid care management team member scheduled for:10/31/22 @ 2:30pm  Estanislado Emms RN, BSN [place]  Managed [facility] RN Care Coordinator [phone]   Following is a copy of your plan of care:  Care Plan : RN Care Manager Plan of Care  Updates made by Heidi Dach, RN since 10/26/2022 12:00 AM     Problem: Health Management needs related to CHF      Long-Range Goal: Development of Plan of Care to address Health Management needs related to CHF   Start Date: 10/26/2022  Expected End Date: 01/24/2023  Note:   Current Barriers:  Chronic Disease Management support and education needs related to CHFMr. Craig Schmidt has been homeless for over 30 years. He is unable to manage his medications due to homelessness. He has recently been approved for disability and waiting on a letter explaining the details. With his disability, he is hoping to procure housing and get his health in order.   RNCM Clinical Goal(s):  Patient will verbalize understanding of plan for management of CHF as evidenced by patient reports attend all scheduled medical appointments: schedule with Dr. Shirlee Latch and PCP as evidenced by provider documentation        continue to work with RN Care Manager and/or Social Worker to address care management and care coordination needs related to CHF as evidenced by adherence to [facility] Team Scheduled appointments  through collaboration with RN Care manager, provider, and care team.   Interventions: Inter-disciplinary care team collaboration (see longitudinal plan of care) Evaluation of current treatment plan related to  self management and patient's adherence to plan as  established by provider Provided patient with medical transportation provided by [facility] [place], [place] Care [phone] Provided patient with [facility] [phone] for an eye exam Provided patient with [facility] [phone], call for Dermatology appointment Provided [facility] [phone]-contact regarding update on housing Provided therapeutic listening   Heart Failure Interventions:  (Status: New goal.)  Long Term Goal  Wt Readings from Last 3 Encounters:  09/12/22 151 lb 11.2 oz (68.8 kg)  08/31/22 146 lb 3.2 oz (66.3 kg)  08/30/22 148 lb 12.8 oz (67.5 kg)   Basic overview and discussion of pathophysiology of Heart Failure reviewed Provided education on low sodium diet Reviewed role of diuretics in prevention of fluid overload and management of heart failure Discussed the importance of keeping all appointments with provider Assessed social determinant of health barriers  Patient Goals/Self-Care Activities: Attend all scheduled provider appointments Call provider office for new concerns or questions  Work with the social worker to address care coordination needs and will continue to work with the clinical team to address health care and disease management related needs

## 2022-10-26 NOTE — Patient Outreach (Signed)
Medicaid Managed Care   Nurse Care Manager Note  10/26/2022 Name:  Craig Schmidt MRN:  [id number] DOB:  02/15/1971  Craig Schmidt is an 52 y.o. year old male who is a primary patient of Rema FendtStephens, Amy J, NP.  [facility] for assistance with:    CHF  Craig Schmidt was given information about Medicaid Managed Care Coordination team services today. Lynelle DoctorAnthony R Schmidt Patient agreed to services and verbal consent obtained.  Engaged with patient by telephone for initial visit in response to provider referral for case management and/or care coordination services.   Assessments/Interventions:  Review of past medical history, allergies, medications, health status, including review of consultants reports, laboratory and other test data, was performed as part of comprehensive evaluation and provision of chronic care management services.  SDOH (Social Determinants of Health) assessments and interventions performed: SDOH Interventions    Flowsheet Row Patient Outreach Telephone from 10/26/2022 in [place] POPULATION HEALTH DEPARTMENT Patient Outreach Telephone from 10/16/2022 in [place] POPULATION HEALTH DEPARTMENT Patient Outreach Telephone from 10/05/2022 in [place] POPULATION HEALTH DEPARTMENT [facility] Patient Outreach Telephone from 09/10/2022 in [place] POPULATION HEALTH DEPARTMENT [facility]  SDOH Interventions        Food Insecurity Interventions Other (Comment)  [BSW for food insecurity] -- -- -- -- --  Housing Interventions --  [BSW and PEH] -- -- Other (Comment)  [PEH] -- --  Transportation Interventions -- -- -- -- -- Estate agentBus Pass Given  Financial Strain Interventions -- -- -- -- -- Other (Comment)  [gave jacket to help keep warm and dry]  Stress Interventions -- [employer]fered Community Wellness  Resources, Provide Counseling Offered [employer] Wellness Resources, Provide Counseling -- [employer] Community Wellness Resources, Provide Counseling --       Care Plan  Allergies  Allergen Reactions   Shellfish Allergy Anaphylaxis   Peanut-Containing Drug Products Itching    Medications Reviewed Today     Reviewed by Heidi Dachobb, Beaux Wedemeyer A, RN (Registered Nurse) on 10/26/22 at 1328  Med List Status: <None>   Medication Order Taking? Sig Documenting Provider Last Dose Status Informant  apixaban (ELIQUIS) 5 MG TABS tablet [id number] No Take 1 tablet (5 mg total) by mouth 2 (two) times daily.  Patient not taking: Reported on 10/26/2022   Tonye Becketlegg, [name], NP Not Taking Active Self  atorvastatin (LIPITOR) 40 MG tablet [id number] No TAKE 1 TABLET(40 MG) BY MOUTH DAILY  Patient not taking: Reported on 10/26/2022   Laurey MoraleMcLean, Dalton S, MD Not Taking Active   carvedilol (COREG) 3.125 MG tablet [id number] No Take 1 tablet (3.125 mg total) by mouth 2 (two) times daily.  Patient not taking: Reported on 10/26/2022   Allayne ButcherSimmons, Brittainy M, [name] Not Taking Active   clopidogrel (PLAVIX) 75 MG tablet [id number] No TAKE ONE TABLET BY MOUTH ONCE DAILY  Patient not taking: Reported on 10/26/2022   Tonye Becketlegg, [name], NP Not Taking Active   digoxin (LANOXIN) 0.125 MG tablet [id number] No TAKE ONE TABLET BY MOUTH ONCE DAILY  Patient not taking: Reported on 10/26/2022   Tonye Becketlegg, [name], NP Not Taking Active   famotidine (PEPCID) 20 MG tablet [id number] No TAKE ONE TABLET BY MOUTH ONCE DAILY  Patient not taking: Reported on 10/26/2022   Tonye Becketlegg, [name], NP Not Taking Active   Glycerin-Hypromellose-PEG 400 0.2-0.2-1 % SOLN [id number] No Place 1  drop into both eyes as needed.  Patient not taking: Reported on 10/26/2022   Tonye Becket D, NP Not Taking Active Self  isosorbide-hydrALAZINE (BIDIL) 20-37.5 MG tablet [id number] No Take 0.5 tablets by mouth 3 (three) times daily. Take 1 & 1/2 tablets by mouth 3 times daily.  Patient not taking: Reported on  09/06/2022   Jacklynn Ganong, [name] Not Taking Active   JARDIANCE 10 MG TABS tablet [id number] No TAKE ONE TABLET BY MOUTH ONCE DAILY  Patient not taking: Reported on 10/26/2022   Tonye Becket D, NP Not Taking Active   lamoTRIgine (LAMICTAL) 25 MG tablet [id number]  Take 1 tablet (25 mg total) by mouth daily for 14 days. Then increase to 50 mg daily. Lamar Sprinkles, MD  Expired 07/10/22 2359   lamoTRIgine (LAMICTAL) 25 MG tablet [id number]  Take 2 tablets (50 mg total) by mouth daily for 14 days. Lamar Sprinkles, MD  Expired 07/12/22 2359   meloxicam (MOBIC) 7.5 MG tablet [id number] No TAKE ONE TABLET BY MOUTH ONCE DAILY  Patient not taking: Reported on 10/26/2022   Jacklynn Ganong, [name] Not Taking Active   potassium chloride SA (KLOR-CON M) 20 MEQ tablet [id number] No Take 1 tablet (20 mEq total) by mouth daily.  Patient not taking: Reported on 10/26/2022   Jacklynn Ganong, [name] Not Taking Active   sacubitril-valsartan (ENTRESTO) 24-26 MG [id number] No Take 1 tablet by mouth 2 (two) times daily.  Patient not taking: Reported on 10/26/2022   Jacklynn Ganong, [name] Not Taking Active   spironolactone (ALDACTONE) 25 MG tablet [id number] No Take 0.5 tablets (12.5 mg total) by mouth at bedtime.  Patient not taking: Reported on 10/26/2022   Allayne Butcher, [name] Not Taking Active   torsemide (DEMADEX) 20 MG tablet [id number] No Take 1 tablet (20 mg total) by mouth daily.  Patient not taking: Reported on 10/26/2022   Jacklynn Ganong, [name] Not Taking Active   triamcinolone ointment (KENALOG) 0.5 % [id number] No APPLY TOPICALLY TO THE AFFECTED AREA TWICE DAILY  Patient not taking: Reported on 10/26/2022   Rema Fendt, NP Not Taking Active             Patient Active Problem List   Diagnosis Date Noted   Chronic combined systolic and diastolic heart failure    ST elevation myocardial infarction (STEMI) of inferolateral wall, subsequent episode of care 12/19/2021   ST elevation myocardial infarction  (STEMI)    Ischemic cardiomyopathy    Cocaine abuse    Smoker    Alcohol abuse    Acute ischemic stroke 09/08/2021   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   Tear of MCL (medial collateral ligament) of knee 05/20/2015   Protein-calorie malnutrition, severe 05/17/2015   Lactic acidosis 05/15/2015   Left knee pain 05/15/2015   Lung nodules 05/15/2015   Hypoglycemia 05/14/2015   Hypothermia 05/14/2015   Acute encephalopathy 05/14/2015   Cocaine abuse with intoxication 05/14/2015   Alcohol intoxication in active alcoholic 05/14/2015   Sepsis 05/14/2015   Homeless 05/14/2015    Conditions to be addressed/monitored per PCP order:  CHF  Care Plan : RN Care Manager Plan of Care  Updates made by Heidi Dach, RN since 10/26/2022 12:00 AM     Problem: Health Management needs related to CHF      Long-Range Goal: Development of Plan of Care to address Health Management needs related to CHF   Start Date: 10/26/2022  Expected End Date: 01/24/2023  Note:   Current  Barriers:  Chronic Disease Management support and education needs related to CHFMr. Azua has been homeless for over 30 years. He is unable to manage his medications due to homelessness. He has recently been approved for disability and waiting on a letter explaining the details. With his disability, he is hoping to procure housing and get his health in order.   RNCM Clinical Goal(s):  Patient will verbalize understanding of plan for management of CHF as evidenced by patient reports attend all scheduled medical appointments: schedule with Dr. Shirlee Latch and PCP as evidenced by provider documentation        continue to work with RN Care Manager and/or Social Worker to address care management and care coordination needs related to CHF as evidenced by adherence to CM Team Scheduled appointments     through collaboration with RN Care manager, provider, and care team.   Interventions: Inter-disciplinary care team collaboration (see  longitudinal plan of care) Evaluation of current treatment plan related to  self management and patient's adherence to plan as established by provider Provided patient with medical transportation provided by [facility], [place] Care [phone] Provided patient with [facility] [phone] for an eye exam Provided patient with [facility] [phone], call for Dermatology appointment Provided [facility] [phone]-contact regarding update on housing Provided therapeutic listening   Heart Failure Interventions:  (Status: New goal.)  Long Term Goal  Wt Readings from Last 3 Encounters:  09/12/22 151 lb 11.2 oz (68.8 kg)  08/31/22 146 lb 3.2 oz (66.3 kg)  08/30/22 148 lb 12.8 oz (67.5 kg)   Basic overview and discussion of pathophysiology of Heart Failure reviewed Provided education on low sodium diet Reviewed role of diuretics in prevention of fluid overload and management of heart failure Discussed the importance of keeping all appointments with provider Assessed social determinant of health barriers  Patient Goals/Self-Care Activities: Attend all scheduled provider appointments Call provider office for new concerns or questions  Work with the social worker to address care coordination needs and will continue to work with the clinical team to address health care and disease management related needs       Follow Up:  Patient agrees to Care Plan and Follow-up.  Plan: The Managed Medicaid care management team will reach out to the patient again over the next 7 days.  Date/time of next scheduled RN care management/care coordination outreach:  10/31/22 @ 2:30pm  Estanislado Emms RN, BSN [place]  Managed [facility] RN Care Coordinator [phone]

## 2022-10-31 ENCOUNTER — Other Ambulatory Visit: Payer: Medicaid Other

## 2022-10-31 ENCOUNTER — Encounter: Payer: Self-pay | Admitting: *Deleted

## 2022-10-31 ENCOUNTER — Other Ambulatory Visit: Payer: Medicaid Other | Admitting: *Deleted

## 2022-10-31 NOTE — Patient Outreach (Signed)
Medicaid Managed Care Social Work Note  10/31/2022 Name:  Craig Schmidt MRN:  [id number] DOB:  09/12/1970  Craig Schmidt is an 52 y.o. year old male who is a primary patient of Rema FendtStephens, Amy J, NP.  [facility] was consulted for assistance with:  Community Resources   Craig Schmidt was given information about Medicaid Managed Care Coordination team services today. Lynelle DoctorAnthony R Lesinski Patient agreed to services and verbal consent obtained.  Engaged with patient  for by telephone forfollow up visit in response to referral for case management and/or care coordination services.   Assessments/Interventions:  Review of past medical history, allergies, medications, health status, including review of consultants reports, laboratory and other test data, was performed as part of comprehensive evaluation and provision of chronic care management services.  SDOH: (Social Determinant of Health) assessments and interventions performed: SDOH Interventions    Flowsheet Row Patient Outreach Telephone from 10/26/2022 in [place] POPULATION HEALTH DEPARTMENT Patient Outreach Telephone from 10/16/2022 in [place] POPULATION HEALTH DEPARTMENT Patient Outreach Telephone from 10/05/2022 in [place] POPULATION HEALTH DEPARTMENT [facility] Patient Outreach Telephone from 09/10/2022 in [place] POPULATION HEALTH DEPARTMENT [facility]  SDOH Interventions        Food Insecurity Interventions Other (Comment)  [BSW for food insecurity] -- -- -- -- --  Housing Interventions --  [BSW and PEH] -- -- Other (Comment)  [PEH] -- --  Transportation Interventions -- -- -- -- -- Estate agentBus Pass Given  Financial Strain Interventions -- -- -- -- -- Other (Comment)  [gave jacket to help keep warm and dry]  Stress Interventions -- [employer]fered Community Wellness  Resources, Provide Counseling Offered [employer] Wellness Resources, Provide Counseling -- [employer] Community Wellness Resources, Provide Counseling --     BSW completed a telephone outreach with patient, he stated he has not been to his mother in laws home to check the mail, but is sure he received the resources. Patient states he has had a lot going on and is thankful for the resources sent. No other resources are needed at this time.  Advanced Directives Status:  Not addressed in this encounter.  Care Plan                 Allergies  Allergen Reactions   Shellfish Allergy Anaphylaxis   Peanut-Containing Drug Products Itching    Medications Reviewed Today     Reviewed by Heidi Dachobb, Melanie A, RN (Registered Nurse) on 10/26/22 at 1328  Med List Status: <None>   Medication Order Taking? Sig Documenting Provider Last Dose Status Informant  apixaban (ELIQUIS) 5 MG TABS tablet [id number] No Take 1 tablet (5 mg total) by mouth 2 (two) times daily.  Patient not taking: Reported on 10/26/2022   Tonye Becketlegg, [name], NP Not Taking Active Self  atorvastatin (LIPITOR) 40 MG tablet [id number] No TAKE 1 TABLET(40 MG) BY MOUTH DAILY  Patient not taking: Reported on 10/26/2022   Laurey MoraleMcLean, Dalton S, MD Not Taking Active   carvedilol (COREG) 3.125 MG tablet [id number] No Take 1 tablet (3.125 mg total) by mouth 2 (two) times daily.  Patient not taking: Reported on 10/26/2022   Allayne ButcherSimmons, Brittainy M, [name] Not Taking Active   clopidogrel (PLAVIX) 75 MG tablet [id number] No TAKE ONE TABLET BY MOUTH ONCE DAILY  Patient not taking: Reported on 10/26/2022   Sherald Hesslegg, [name], NP Not Taking  Active   digoxin (LANOXIN) 0.125 MG tablet [id number] No TAKE ONE TABLET BY MOUTH ONCE DAILY  Patient not taking: Reported on 10/26/2022   Tonye Becket D, NP Not Taking Active   famotidine (PEPCID) 20 MG tablet [id number] No TAKE ONE TABLET BY MOUTH ONCE DAILY  Patient not taking: Reported on 10/26/2022   Tonye Becket D, NP Not Taking Active    Glycerin-Hypromellose-PEG 400 0.2-0.2-1 % SOLN [id number] No Place 1 drop into both eyes as needed.  Patient not taking: Reported on 10/26/2022   Tonye Becket D, NP Not Taking Active Self  isosorbide-hydrALAZINE (BIDIL) 20-37.5 MG tablet [id number] No Take 0.5 tablets by mouth 3 (three) times daily. Take 1 & 1/2 tablets by mouth 3 times daily.  Patient not taking: Reported on 09/06/2022   Jacklynn Ganong, [name] Not Taking Active   JARDIANCE 10 MG TABS tablet [id number] No TAKE ONE TABLET BY MOUTH ONCE DAILY  Patient not taking: Reported on 10/26/2022   Tonye Becket D, NP Not Taking Active   lamoTRIgine (LAMICTAL) 25 MG tablet [id number]  Take 1 tablet (25 mg total) by mouth daily for 14 days. Then increase to 50 mg daily. Lamar Sprinkles, MD  Expired 07/10/22 2359   lamoTRIgine (LAMICTAL) 25 MG tablet [id number]  Take 2 tablets (50 mg total) by mouth daily for 14 days. Lamar Sprinkles, MD  Expired 07/12/22 2359   meloxicam (MOBIC) 7.5 MG tablet [id number] No TAKE ONE TABLET BY MOUTH ONCE DAILY  Patient not taking: Reported on 10/26/2022   Jacklynn Ganong, [name] Not Taking Active   potassium chloride SA (KLOR-CON M) 20 MEQ tablet [id number] No Take 1 tablet (20 mEq total) by mouth daily.  Patient not taking: Reported on 10/26/2022   Jacklynn Ganong, [name] Not Taking Active   sacubitril-valsartan (ENTRESTO) 24-26 MG [id number] No Take 1 tablet by mouth 2 (two) times daily.  Patient not taking: Reported on 10/26/2022   Jacklynn Ganong, [name] Not Taking Active   spironolactone (ALDACTONE) 25 MG tablet [id number] No Take 0.5 tablets (12.5 mg total) by mouth at bedtime.  Patient not taking: Reported on 10/26/2022   Allayne Butcher, [name] Not Taking Active   torsemide (DEMADEX) 20 MG tablet [id number] No Take 1 tablet (20 mg total) by mouth daily.  Patient not taking: Reported on 10/26/2022   Jacklynn Ganong, [name] Not Taking Active   triamcinolone ointment (KENALOG) 0.5 % [id number] No APPLY TOPICALLY TO THE  AFFECTED AREA TWICE DAILY  Patient not taking: Reported on 10/26/2022   Rema Fendt, NP Not Taking Active             Patient Active Problem List   Diagnosis Date Noted   Chronic combined systolic and diastolic heart failure    ST elevation myocardial infarction (STEMI) of inferolateral wall, subsequent episode of care 12/19/2021   ST elevation myocardial infarction (STEMI)    Ischemic cardiomyopathy    Cocaine abuse    Smoker    Alcohol abuse    Acute ischemic stroke 09/08/2021   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   Tear of MCL (medial collateral ligament) of knee 05/20/2015   Protein-calorie malnutrition, severe 05/17/2015   Lactic acidosis 05/15/2015   Left knee pain 05/15/2015   Lung nodules 05/15/2015   Hypoglycemia 05/14/2015   Hypothermia 05/14/2015   Acute encephalopathy 05/14/2015   Cocaine abuse with intoxication 05/14/2015   Alcohol intoxication in active alcoholic 05/14/2015   Sepsis 05/14/2015   Homeless 05/14/2015    Conditions  to be addressed/monitored per PCP order:  Homelessness  Care Plan : LCSW plan of care  Updates made by Shaune Leeks since 10/31/2022 12:00 AM     Problem: Addictive Behavior      Long-Range Goal: Addictive Behavior and Stress Symptoms Managed   Start Date: 09/10/2022  Note:   Timeframe:  Long-Range Goal Priority:  Medium Start Date:  09/10/22 at                    Expected End Date: ongoing                 Follow Up Date- 11/01/22 at 9 am   - avoid negative self-talk - be honest with myself - begin personal counseling - check out recovery housing - exercise at least 2 to 3 times per week - identify a recovery coach - identify triggers for relapse - join AA (Alcoholics Anonymous) or NA (Narcotics Anonymous) - learn how to handle negative feelings without drugs or alcohol - make a list of pleasurable things to do without using alcohol or drugs - make a plan to deal with triggers like holidays and  anniversaries - make a plan to fix broken relationships - make a plan to handle bad days - make a written relapse plan - return to AA (Alcoholics Anonymous) or NA (Narcotics Anonymous) - spend time or talk with a recovery coach or support person at least 2 or 3 times every week - spend time or talk with people who do not drink or use every day    Why is this important?   It is possible that after you stop drinking or using drugs that you may slip and use again. This is called a relapse.  A relapse does not mean that you have failed. It means you have had one or a few bad days.  Make sure you know why it is important for you to stay sober and remind yourself often.  Once you figure out what triggered the relapse and take the steps to deal with it, you can be even more hopeful that recovery is possible.       - avoid negative self-talk - develop a personal safety plan - develop a plan to deal with triggers like holidays, anniversaries - exercise at least 2 to 3 times per week - have a plan for how to handle bad days - journal feelings and what helps to feel better or worse - spend time or talk with others at least 2 to 3 times per week - spend time or talk with others every day - watch for early signs of feeling worse - write in journal every day    Why is this important?   Keeping track of your progress will help your treatment team find the right mix of medicine and therapy for you.  Write in your journal every day.  Day-to-day changes in depression symptoms are normal. It may be more helpful to check your progress at the end of each week instead of every day.       Current barriers:   Chronic Mental Health needs related to Depression and Alcohol/Substance Abuse 30 plus years of homelessness, patient lives in a tent Low support network Currently unable to independently self manage needs related to mental health conditions. Knowledge Deficits related to short term plan for care  coordination  needs and long term plans for chronic disease management Lacks knowledge of where and how to connect for  mental health support Needs Support, Education, and Care Coordination in order to meet unmet need.   Clinical Goal(s): Over the next 120 days, patient will work with SW to reduce or manage symptoms of cravings and anxiety/depression until connected for ongoing counseling or finds medication that helps his symptom relief.   Clinical Interventions:  Assessed patient's previous treatment, needs, coping skills, current treatment, support system and barriers to care Patient interviewed and appropriate assessments performed or reviewed: brief mental health assessment;Suicidal Ideation/Homicidal Ideation: No Provided basic mental health support, education and interventions  Patient reports that he is has nursing and social work needs. He reports that he is looking for stable housing, transportation resources, dental resources, mental health resources and nursing case management for CHF due to recent hospitalization.  Relapse prevention education provided to patient on 09/10/22 Discussed several options for long term counseling based on need and insurance.  Reviewed mental health medications with patient prescribed by PCP and discussed compliance  [facility] LCSW educated patient on medication therapy that can help with alcohol triggers. Other interventions include: Motivational Interviewing [employer] Mindfulness or Management consultant  Inter-disciplinary care team collaboration (see longitudinal plan of care) [facility] LCSW educated patient on healthy coping skills to implement into is daily routine to combat alcohol substance cravings and anxiety and depressive symptoms once they arise. [facility] LCSW made referral to [facility] RNCM and BSW on 09/10/22. Referral made to [facility] for individual counseling on 09/10/22 with patient's consent. Patient has two upcoming mental health appointments in  March at [facility]. Patient physically wrote down resource contact information. [facility] LCSW 10/05/22 update- Patient was a no show for his scheduled psychiatry appointment at [facility] even though he was reminded of this appointment several times by [facility] LCSW. Patient reports that he will contact [facility] and reschedule visit or utilize their walk in clinic. Patient has a scheduled counseling appointment at [facility] on 10/17/22. Patient was provided [facility] walk in clinic information. 10/16/22 update- Patient has his initial counseling appointment tomorrow and was made aware that he already has one no show with [facility] and will be discharged from program if he has another no show. He reports that he will be able to go tomorrow. [facility] LCSW educated him on their enrollment process. Emotional support provided for ongoing stressors that he faces in managing his health.  Past update, referral from PCP states-Needs as much support as is available. Chronically homeless with wife and dog- currently in hotel being paid for through [facility] and supposedly working with them to assess for other options but nothing yet. Has SSI income. Has chronic mental health and competency concerns which make him difficult to work with somewhat. Has issues with transport to appts due to lack of stable address so hard to set up medicaid transport. Needs chronic case management to help reduce risk of admission is a high ED utilizer. Being seen by our Community Paramedic at this time but he will likely not be able to remain on program for much longer as he is not [facility] so want to help find support for him following DC. [facility] team is now involved with patient.   BSW completed a telephone outreach with patient, he stated he has not been to his mother in laws home to check the mail, but is sure he received the resources. Patient states he has had a lot going on and is thankful for the resources sent. No other resources are needed at this time. Patient Goals/Self-Care Activities:  Over the next 120 days  Implement interventions discussed today to decreases alcohol usage, symptoms of anxiety and depression and increase knowledge and/or ability of: coping skills, healthy habits, self-management skills, and stress reduction.  Alcohol Use Education   Score 0 = Abstainers Score 8-19 = Unhealthy/High Risk Drinkers  Score 1-7 = Low Risk Drinkers Score 20+ = Probable Alcohol Dependence  High Scores (20+) on the Alcohol Use Identification Test Consider becoming involved in a structured program.  You should stop drinking if: You have tried to cut down before but have not been successful, or  You suffer from morning shakes during a heavy drinking period, or You have high blood pressure, or You are pregnant, or You have liver disease, or You are taking medicines that react with alcohol, or Your alcohol use is affecting your social relationships, or You have legal consequences like DUIs, or You call in sick to work, or You cannot take care of our children, or Someone close to you says you drink too much   How Much Alcohol is a Drink: Beer: 12 oz. = 1 drink 16 oz. = 1.3 drinks 22 oz. = 2 drinks 40 oz. = 3.3 drinks  Wine: 5 oz. = 1 drink 740 mL (25 oz.) bottle = 5 drinks Malt Liquor: 12 oz. = 1.5 drinks 16 oz. = 2 drinks 22 oz. = 2.5 drinks 40 oz. = 4.5 drinks  80-Proof Spirits - Hard Liquor: 1 shot = 1 drink 1 mixed drink = number of shots Can equal 1-3 drinks  What is Low-risk Drinking? Have no more than 2 drinks of alcohol per day Drink no more than 5 days per week Do not drink alcohol drink alcohol when: You drive or operate machinery You are pregnant or breast feeding You are taking medications that interact with alcohol You have medical conditions made worse with alcohol You can stop or control your drinking      Identify Your Triggers for Drinking Parties Particular People Feeling lonely Feeling tense Family problems Feeling sad Feeling happy  Feeling bored After work Problems sleeping Criticism Feelings of failure After being paid When others are drinking In bars When out for dinner After arguments Weekends Feeling restless Being in pain   Effects of High-Risk Drinking To the Brain: Aggressive, irrational behavior Arguments, violence Depression, nervousness Alcohol dependence, memory loss To the Nervous System: Trembling hands, tingling fingers Numbness, painful nerves Impaired sensation leading to falls Numb tingling toes To Your Lifestyle: Social, legal, medical problems Domestic trouble/relationship loss Job loss & financial problems Shortened life span Accidents and death from drunk driving   To the Face: Premature aging, drinker's nose Cancer of the throat & mouth To the Body: Frequent cold Reduced resistance to infection Increased risk of pneumonia Weakness of heart muscle Heart failure, anemia Impaired blood clotting Breast cancer Vitamin deficiency, bleeding Severe Inflammation of the stomach Vomiting, diarrhea, malnutrition Ulcer, inflammation of the pancreas Impaired sexual performance Birth defects, including deformities, retardation, and low birthweight  Ways to Cope Without Drinking Go home if you tend to drink after work Find another activity Switch to nonalcoholic beverages Change friends Join a Lexicographer Visit relatives Plan/take a trip Go for a walk Take up a hobby Listen to music Talk to a friend Reading What would you do if you had no worries about failing?        Good Reasons for Drinking Less I will live longer - probably 8-10 years. I will sleep better. I will be happier. I will save a lot of  money My relationships will improve. I will stay younger for longer. I will achieve more in my life There will be a greater chance that I will survive to a healthy old age with no premature damage to my brain.  I will be better at my job. I will be less likely to  feel depressed and commit suicide (6 times less likely). I will be less likely to die of heart disease or cancer. Other people will respect me I will be less likely to get into trouble with the police. The possibility that I will die of liver disease will be dramatically reduced (12 times less likely). It will be less likely that I will die in a car accident (3 times less likely).   Strategies for Cutting Down Keep Track.  Find a way to keep track of how much you drink.  If you make a note of each drink before you drink it, this will help slow you down. Count and Measure.  Know the standard drink sizes.  Ask the bartender or server about the amount of alcohol in a mixed drink. Set Goals.  Decide how many days a week you will drink and how many drinks each day. Pace and Space.  When you do drink, pace yourself.  Have no more than one drink with alcohol per hour.  Alternate "drink spacers" non-alcoholic drinks such as water, soda, or juice with drinks containing alcohol. Include Food.  Don't drink on an empty stomach.  Have some food so the alcohol will be absorbed more slowly into your system.  Avoid Triggers.  Avoid people, places, or activities that have led to drinking in the past.  Certain times of day or feelings may also be triggers.  Make a plan so you will know what you can do instead of drinking. Plan to Handle Urges.  When an urge hits, consider these options:  Remind yourself of your reasons for changing.  Or talk it through with someone you trust. Or get involved with a healthy, distracting activity.  Or, "urge surf" - instead of fighting the feeling, accept it and ride it out, knowing it will soon crest like a wave and pass. Know Your "No".  Have a polite, convincing "no thanks" for those times when you may be offered a drink and don't want one.  The faster you can say no to these offers, the less likely you are to give in.  If you hesitate, it allows you time to think of excuses to go  along.       10 LITTLE Things To Do When You're Feeling Too Down To Do Anything  Take a shower. Even if you plan to stay in all day long and not see a soul, take a shower. It takes the most effort to hop in to the shower but once you do, you'll feel immediate results. It will wake you up and you'll be feeling much fresher (and cleaner too).  Brush and floss your teeth. Give your teeth a good brushing with a floss finish. It's a small task but it feels so good and you can check 'taking care of your health' off the list of things to do.  Do something small on your list. Most of [place] have some small thing we would like to get done (load of laundry, sew a button, email a friend). Doing one of these things will make you feel like you've accomplished something.  Drink water. Drinking water is easy right? It's also  really beneficial for your health so keep a glass beside you all day and take sips often. It gives you energy and prevents you from boredom eating.  Do some floor exercises. The last thing you want to do is exercise but it might be just the thing you need the most. Keep it simple and do exercises that involve sitting or laying on the floor. Even the smallest of exercises release chemicals in the brain that make you feel good. Yoga stretches or core exercises are going to make you feel good with minimal effort.  Make your bed. Making your bed takes a few minutes but it's productive and you'll feel relieved when it's done. An unmade bed is a huge visual reminder that you're having an unproductive day. Do it and consider it your housework for the day.  Put on some nice clothes. Take the sweatpants off even if you don't plan to go anywhere. Put on clothes that make you feel good. Take a look in the mirror so your brain recognizes the sweatpants have been replaced with clothes that make you look great. It's an instant confidence booster.  Wash the dishes. A pile of dirty dishes in the sink  is a reflection of your mood. It's possible that if you wash up the dishes, your mood will follow suit. It's worth a try.  Cook a real meal. If you have the luxury to have a "do nothing" day, you have time to make a real meal for yourself. Make a meal that you love to eat. The process is good to get you out of the funk and the food will ensure you have more energy for tomorrow.  Write out your thoughts by hand. When you hand write, you stimulate your brain to focus on the moment that you're in so make yourself comfortable and write whatever comes into your mind. Put those thoughts out on paper so they stop spinning around in your head. Those thoughts might be the very thing holding you down.  Follow up goal     Follow up:  Patient agrees to Care Plan and Follow-up.  Plan: The  Patient has been provided with contact information for the Managed Medicaid care management team and has been advised to call with any health related questions or concerns.     Abelino Derrick, MHA [facility]  Managed [facility] Social Worker [phone]

## 2022-10-31 NOTE — Patient Outreach (Signed)
Medicaid Managed Care   Nurse Care Manager Note  10/31/2022 Name:  Craig Schmidt MRN:  [id number] DOB:  05-15-1971  Craig Schmidt is an 52 y.o. year old male who is a primary patient of Craig Fendt, NP.  The [facility] Managed Care Coordination team was consulted for assistance with:    CHF  [name] was given information about Medicaid Managed Care Coordination team services today. Craig Schmidt Patient agreed to services and verbal consent obtained.  Engaged with patient by telephone for follow up visit in response to provider referral for case management and/or care coordination services.   Assessments/Interventions:  Review of past medical history, allergies, medications, health status, including review of consultants reports, laboratory and other test data, was performed as part of comprehensive evaluation and provision of chronic care management services.  SDOH (Social Determinants of Health) assessments and interventions performed: SDOH Interventions    Flowsheet Row Patient Outreach Telephone from 10/31/2022 in [place] POPULATION HEALTH DEPARTMENT Patient Outreach Telephone from 10/26/2022 in [place] POPULATION HEALTH DEPARTMENT Patient Outreach Telephone from 10/16/2022 in [place] POPULATION HEALTH DEPARTMENT Patient Outreach Telephone from 10/05/2022 in [place] POPULATION HEALTH DEPARTMENT Telephone from 09/26/2022 in [facility] Patient Outreach Telephone from 09/10/2022 in [place] POPULATION HEALTH DEPARTMENT  SDOH Interventions        Food Insecurity Interventions -- Other (Comment)  [BSW for food insecurity] -- -- -- --  Housing Interventions -- --  [BSW and PEH] -- -- Other (Comment)  [PEH] --  Transportation Interventions Payor Benefit  [Patient provided with [facility] [phone], medical transportation provided by [employer]] -- -- -- -- --  Utilities Interventions Intervention Not Indicated --  -- -- -- --  Stress Interventions -- -- [employer], Provide Counseling Offered [employer], Provide Counseling -- [employer], Provide Counseling       Care Plan  Allergies  Allergen Reactions   Shellfish Allergy Anaphylaxis   Peanut-Containing Drug Products Itching    Medications Reviewed Today     Reviewed by Craig Dach, RN (Registered Nurse) on 10/31/22 at 1432  Med List Status: <None>   Medication Order Taking? Sig Documenting Provider Last Dose Status Informant  apixaban (ELIQUIS) 5 MG TABS tablet [id number] No Take 1 tablet (5 mg total) by mouth 2 (two) times daily.  Patient not taking: Reported on 10/26/2022   Craig Becket D, NP Not Taking Active Self  atorvastatin (LIPITOR) 40 MG tablet [id number] No TAKE 1 TABLET(40 MG) BY MOUTH DAILY  Patient not taking: Reported on 10/26/2022   Craig Morale, MD Not Taking Active   carvedilol (COREG) 3.125 MG tablet [id number] No Take 1 tablet (3.125 mg total) by mouth 2 (two) times daily.  Patient not taking: Reported on 10/26/2022   Craig Butcher, [name] Not Taking Active   clopidogrel (PLAVIX) 75 MG tablet [id number] No TAKE ONE TABLET BY MOUTH ONCE DAILY  Patient not taking: Reported on 10/26/2022   Craig Becket D, NP Not Taking Active   digoxin (LANOXIN) 0.125 MG tablet [id number] No TAKE ONE TABLET BY MOUTH ONCE DAILY  Patient not taking: Reported on 10/26/2022   Craig Becket D, NP Not Taking Active   famotidine (PEPCID) 20 MG tablet [id number] No TAKE ONE TABLET BY MOUTH ONCE DAILY  Patient not taking: Reported on 10/26/2022   Craig Becket D, NP Not Taking Active   Glycerin-Hypromellose-PEG 400 0.2-0.2-1 % SOLN [id number]  No Place 1 drop into both eyes as needed.  Patient not taking: Reported on 10/26/2022   Craig Becketlegg, Craig D, NP Not Taking Active Self  isosorbide-hydrALAZINE (BIDIL) 20-37.5 MG tablet [id number] No Take 0.5 tablets by mouth 3 (three) times daily. Take 1 & 1/2  tablets by mouth 3 times daily.  Patient not taking: Reported on 09/06/2022   Craig Schmidt, Craig M, [name] Not Taking Active   JARDIANCE 10 MG TABS tablet [id number] No TAKE ONE TABLET BY MOUTH ONCE DAILY  Patient not taking: Reported on 10/26/2022   Craig Becketlegg, Craig D, NP Not Taking Active   lamoTRIgine (LAMICTAL) 25 MG tablet [id number] No Take 1 tablet (25 mg total) by mouth daily for 14 days. Then increase to 50 mg daily. Lamar Sprinklesosby, Courtney, MD Taking Expired 07/10/22 2359   lamoTRIgine (LAMICTAL) 25 MG tablet [id number] No Take 2 tablets (50 mg total) by mouth daily for 14 days. Lamar Sprinklesosby, Courtney, MD Taking Expired 07/12/22 2359   meloxicam (MOBIC) 7.5 MG tablet [id number] No TAKE ONE TABLET BY MOUTH ONCE DAILY  Patient not taking: Reported on 10/26/2022   Craig Schmidt, Craig M, [name] Not Taking Active   potassium chloride SA (KLOR-CON Schmidt) 20 MEQ tablet [id number] No Take 1 tablet (20 mEq total) by mouth daily.  Patient not taking: Reported on 10/26/2022   Craig Schmidt, Craig M, [name] Not Taking Active   sacubitril-valsartan (ENTRESTO) 24-26 MG [id number] No Take 1 tablet by mouth 2 (two) times daily.  Patient not taking: Reported on 10/26/2022   Craig Schmidt, Craig M, [name] Not Taking Active   spironolactone (ALDACTONE) 25 MG tablet [id number] No Take 0.5 tablets (12.5 mg total) by mouth at bedtime.  Patient not taking: Reported on 10/26/2022   Craig ButcherSimmons, Craig M, [name] Not Taking Active   torsemide (DEMADEX) 20 MG tablet [id number] No Take 1 tablet (20 mg total) by mouth daily.  Patient not taking: Reported on 10/26/2022   Craig Schmidt, Craig M, [name] Not Taking Active   triamcinolone ointment (KENALOG) 0.5 % [id number] No APPLY TOPICALLY TO THE AFFECTED AREA TWICE DAILY  Patient not taking: Reported on 10/26/2022   Craig FendtStephens, Craig J, NP Not Taking Active             Patient Active Problem List   Diagnosis Date Noted   Chronic combined systolic and diastolic heart failure    ST elevation myocardial infarction (STEMI) of inferolateral  wall, subsequent episode of care 12/19/2021   ST elevation myocardial infarction (STEMI)    Ischemic cardiomyopathy    Cocaine abuse    Smoker    Alcohol abuse    Acute ischemic stroke 09/08/2021   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   Tear of MCL (medial collateral ligament) of knee 05/20/2015   Protein-calorie malnutrition, severe 05/17/2015   Lactic acidosis 05/15/2015   Left knee pain 05/15/2015   Lung nodules 05/15/2015   Hypoglycemia 05/14/2015   Hypothermia 05/14/2015   Acute encephalopathy 05/14/2015   Cocaine abuse with intoxication 05/14/2015   Alcohol intoxication in active alcoholic 05/14/2015   Sepsis 05/14/2015   Homeless 05/14/2015    Conditions to be addressed/monitored per PCP order:  CHF  Care Plan : RN Care Manager Plan of Care  Updates made by Craig Dachobb, Maisyn Nouri A, RN since 10/31/2022 12:00 AM     Problem: Health Management needs related to CHF      Long-Range Goal: Development of Plan of Care to address Health Management needs related to CHF   Start Date: 10/26/2022  Expected End Date: 01/24/2023  Note:  Current Barriers:  Chronic Disease Management support and education needs related to CHFMr. Arrey has not scheduled appointments at this time. He has followed up on disability determination, his letter has been lost and he has contacted his attorney to follow up. He would like permanent housing to have a place to store medications. He has difficulty managing his medications as he does not have a stable place to keep them secured.  RNCM Clinical Goal(s):  Patient will verbalize understanding of plan for management of CHF as evidenced by patient reports attend all scheduled medical appointments: schedule with Dr. Shirlee Latch and PCP as evidenced by provider documentation        continue to work with RN Care Manager and/or Social Worker to address care management and care coordination needs related to CHF as evidenced by adherence to CM Team Scheduled  appointments     through collaboration with RN Care manager, provider, and care team.   Interventions: Inter-disciplinary care team collaboration (see longitudinal plan of care) Evaluation of current treatment plan related to  self management and patient's adherence to plan as established by provider Provided patient with medical transportation provided by [facility], [employer] [phone] the importance of calling ahead to schedule transportation, and the importance of arriving at least 15 minutes early to all appointments Provided patient with [facility] [phone] for an eye exam-has not scheduled eye exam Provided patient with [facility] waiting on call back to schedule unless a new referral is needed Discussed update with [facility] [phone] has an appointment on 11/07/22 at 11:30am with [name] Provided therapeutic listening Advised patient to schedule follow up with PCP and Cardiology-he has contact information Advised to continue working with BSW for resources   Heart Failure Interventions:  (Status: New goal.)  Long Term Goal  Wt Readings from Last 3 Encounters:  09/12/22 151 lb 11.2 oz (68.8 kg)  08/31/22 146 lb 3.2 oz (66.3 kg)  08/30/22 148 lb 12.8 oz (67.5 kg)   Basic overview and discussion of pathophysiology of Heart Failure reviewed Provided education on low sodium diet Reviewed role of diuretics in prevention of fluid overload and management of heart failure Discussed the importance of keeping all appointments with provider Assessed social determinant of health barriers  Patient Goals/Self-Care Activities: Attend all scheduled provider appointments Call provider office for new concerns or questions  Work with the social worker to address care coordination needs and will continue to work with the clinical team to address health care and disease management related needs       Follow  Up:  Patient agrees to Care Plan and Follow-up.  Plan: The Managed Medicaid care management team will reach out to the patient again over the next 30 days.  Date/time of next scheduled RN care management/care coordination outreach:  12/03/22 @ 2:30pm  Estanislado Emms RN, BSN [place]  Managed [facility] RN Care Coordinator [phone]

## 2022-10-31 NOTE — Patient Instructions (Signed)
Visit Information  Craig Schmidt was given information about Medicaid Managed Care team care coordination services as a part of their Healthy [facility] Medicaid benefit. Craig Schmidt verbally consented to engagement with the [facility] Managed Care team.   If you are experiencing a medical emergency, please call 911 or report to your local emergency department or urgent care.   If you have a non-emergency medical problem during routine business hours, please contact your provider's office and ask to speak with a nurse.   For questions related to your Healthy [facility] health plan, please call: [phone] or visit the homepage here: [email or web address]  If you would like to schedule transportation through your Healthy [facility] plan, please call the following number at least 2 days in advance of your appointment: [phone]  For information about your ride after you set it up, call Ride Assist at [phone]. Use this number to activate a Will Call pickup, or if your transportation is late for a scheduled pickup. Use this number, too, if you need to make a change or cancel a previously scheduled reservation.  If you need transportation services right away, call [phone]. The after-hours call center is staffed 24 hours to handle ride assistance and urgent reservation requests (including discharges) 365 days a year. Urgent trips include sick visits, hospital discharge requests and life-sustaining treatment.  Call the [facility] Line at [phone], at any time, 24 hours a day, 7 days a week. If you are in danger or need immediate medical attention call 911.  If you would like help to quit smoking, call 1-800-QUIT-NOW (9340864008) OR Espaol: 1-855-Djelo-Ya (0-626-948-5462) o para ms informacin haga clic aqu or Text READY to 703-500 to register via text  [name] - following are the goals we discussed in your visit today:    Goals Addressed   None       The  Patient                                              has been provided with contact information for the Managed Medicaid care management team and has been advised to call with any health related questions or concerns.   Craig Schmidt, Craig Schmidt, MHA [facility] Health  Managed Medicaid Social Worker [phone]   Following is a copy of your plan of care:  Care Plan : LCSW plan of care  Updates made by Craig Schmidt since 10/31/2022 12:00 AM     Problem: Addictive Behavior      Long-Range Goal: Addictive Behavior and Stress Symptoms Managed   Start Date: 09/10/2022  Note:   Timeframe:  Long-Range Goal Priority:  Medium Start Date:  09/10/22 at                    Expected End Date: ongoing                 Follow Up Date- 11/01/22 at 9 am   - avoid negative self-talk - be honest with myself - begin personal counseling - check out recovery housing - exercise at least 2 to 3 times per week - identify a recovery coach - identify triggers for relapse - join AA (Alcoholics Anonymous) or NA (Narcotics Anonymous) - learn how to handle negative feelings without drugs or alcohol - make a list of pleasurable things to do without using alcohol  or drugs - make a plan to deal with triggers like holidays and anniversaries - make a plan to fix broken relationships - make a plan to handle bad days - make a written relapse plan - return to AA (Alcoholics Anonymous) or NA (Narcotics Anonymous) - spend time or talk with a recovery coach or support person at least 2 or 3 times every week - spend time or talk with people who do not drink or use every day    Why is this important?   It is possible that after you stop drinking or using drugs that you may slip and use again. This is called a relapse.  A relapse does not mean that you have failed. It means you have had one or a few bad days.  Make sure you know why it is important for you to stay sober and remind  yourself often.  Once you figure out what triggered the relapse and take the steps to deal with it, you can be even more hopeful that recovery is possible.       - avoid negative self-talk - develop a personal safety plan - develop a plan to deal with triggers like holidays, anniversaries - exercise at least 2 to 3 times per week - have a plan for how to handle bad days - journal feelings and what helps to feel better or worse - spend time or talk with others at least 2 to 3 times per week - spend time or talk with others every day - watch for early signs of feeling worse - write in journal every day    Why is this important?   Keeping track of your progress will help your treatment team find the right mix of medicine and therapy for you.  Write in your journal every day.  Day-to-day changes in depression symptoms are normal. It may be more helpful to check your progress at the end of each week instead of every day.       Current barriers:   Chronic Mental Health needs related to Depression and Alcohol/Substance Abuse 30 plus years of homelessness, patient lives in a tent Low support network Currently unable to independently self manage needs related to mental health conditions. Knowledge Deficits related to short term plan for care coordination  needs and long term plans for chronic disease management Lacks knowledge of where and how to connect for mental health support Needs Support, Education, and Care Coordination in order to meet unmet need.   Clinical Goal(s): Over the next 120 days, patient will work with SW to reduce or manage symptoms of cravings and anxiety/depression until connected for ongoing counseling or finds medication that helps his symptom relief.   Clinical Interventions:  Assessed patient's previous treatment, needs, coping skills, current treatment, support system and barriers to care Patient interviewed and appropriate assessments performed or reviewed: brief  mental health assessment;Suicidal Ideation/Homicidal Ideation: No Provided basic mental health support, education and interventions  Patient reports that he is has nursing and social work needs. He reports that he is looking for stable housing, transportation resources, dental resources, mental health resources and nursing case management for CHF due to recent hospitalization.  Relapse prevention education provided to patient on 09/10/22 Discussed several options for long term counseling based on need and insurance.  Reviewed mental health medications with patient prescribed by PCP and discussed compliance  [facility] LCSW educated patient on medication therapy that can help with alcohol triggers. Other interventions include: Motivational Interviewing Solution-Focused  Strategies Mindfulness or Relaxation Training  Inter-disciplinary care team collaboration (see longitudinal plan of care) [facility] educated patient on healthy coping skills to implement into is daily routine to combat alcohol substance cravings and anxiety and depressive symptoms once they arise. [facility] RNCM and BSW on 09/10/22. [facility] on 09/10/22 with patient's consent. Patient [facility] Patient physically wrote down resource contact information. [facility] 10/05/22 update- Patient [facility] even though he was reminded of this appointment several times by [facility] LCSW. Patient reports that [facility] and reschedule visit or utilize their walk in clinic. [facility]. [facility] information. 10/16/22 update- Patient has his initial counseling appointment tomorrow and was made aware that he already has one [facility] and will be discharged from program if he has another no show. He  reports that he will be able to go tomorrow. [facility] educated him on their enrollment process. Emotional support provided for ongoing stressors that he faces in managing his health.  Past update, referral from PCP states-Needs as much support as is available. Chronically homeless with wife and dog- currently in [facility] and supposedly working with them to assess for other options but nothing yet. Has SSI income. Has chronic mental health and competency concerns which make him difficult to work with somewhat. Has issues with transport to appts due to lack of stable address so hard to set up medicaid transport. Needs chronic case management to help reduce risk of admission is a high ED utilizer. Being seen by our Community Paramedic at this time but he will likely not be able to remain on program for much longer as [facility] so want to help find support for him following DC. [facility] team is now involved with patient.   BSW completed a telephone outreach with patient, he stated he has not been to his mother in laws home to check the mail, but is sure he received the resources. Patient states he has had a lot going on and is thankful for the resources sent. No other resources are needed at this time. Patient Goals/Self-Care Activities: Over the next 120 days Implement interventions discussed today to decreases alcohol usage, symptoms of anxiety and depression and increase knowledge and/or ability of: coping skills, healthy habits, self-management skills, and stress reduction.  Alcohol Use Education   Score 0 = Abstainers Score 8-19 = Unhealthy/High Risk Drinkers  Score 1-7 = Low Risk Drinkers Score 20+ = Probable Alcohol Dependence  High Scores (20+) on the Alcohol Use Identification Test Consider becoming involved in a structured program.  You should stop drinking if: You have tried to cut down before but have not been successful, or  You suffer from morning shakes during a heavy  drinking period, or You have high blood pressure, or You are pregnant, or You have liver disease, or You are taking medicines that react with alcohol, or Your alcohol use is affecting your social relationships, or You have legal consequences like DUIs, or You call in sick to work, or You cannot take care of our children, or Someone close to you says you drink too much   How Much Alcohol is a Drink: Beer: 12 oz. = 1 drink 16 oz. =  1.3 drinks 22 oz. = 2 drinks 40 oz. = 3.3 drinks  Wine: 5 oz. = 1 drink 740 mL (25 oz.) bottle = 5 drinks Malt Liquor: 12 oz. = 1.5 drinks 16 oz. = 2 drinks 22 oz. = 2.5 drinks 40 oz. = 4.5 drinks  80-Proof Spirits - Hard Liquor: 1 shot = 1 drink 1 mixed drink = number of shots Can equal 1-3 drinks  What is Low-risk Drinking? Have no more than 2 drinks of alcohol per day Drink no more than 5 days per week Do not drink alcohol drink alcohol when: You drive or operate machinery You are pregnant or breast feeding You are taking medications that interact with alcohol You have medical conditions made worse with alcohol You can stop or control your drinking      Identify Your Triggers for Drinking Parties Particular People Feeling lonely Feeling tense Family problems Feeling sad Feeling happy Feeling bored After work Problems sleeping Criticism Feelings of failure After being paid When others are drinking In bars When out for dinner After arguments Weekends Feeling restless Being in pain   Effects of High-Risk Drinking To the Brain: Aggressive, irrational behavior Arguments, violence Depression, nervousness Alcohol dependence, memory loss To the Nervous System: Trembling hands, tingling fingers Numbness, painful nerves Impaired sensation leading to falls Numb tingling toes To Your Lifestyle: Social, legal, medical problems Domestic trouble/relationship loss Job loss & financial problems Shortened life span Accidents  and death from drunk driving   To the Face: Premature aging, drinker's nose Cancer of the throat & mouth To the Body: Frequent cold Reduced resistance to infection Increased risk of pneumonia Weakness of heart muscle Heart failure, anemia Impaired blood clotting Breast cancer Vitamin deficiency, bleeding Severe Inflammation of the stomach Vomiting, diarrhea, malnutrition Ulcer, inflammation of the pancreas Impaired sexual performance Birth defects, including deformities, retardation, and low birthweight  Ways to Cope Without Drinking Go home if you tend to drink after work Find another activity Switch to nonalcoholic beverages Change friends Join a Lexicographer Visit relatives Plan/take a trip Go for a walk Take up a hobby Listen to music Talk to a friend Reading What would you do if you had no worries about failing?        Good Reasons for Drinking Less I will live longer - probably 8-10 years. I will sleep better. I will be happier. I will save a lot of money My relationships will improve. I will stay younger for longer. I will achieve more in my life There will be a greater chance that I will survive to a healthy old age with no premature damage to my brain.  I will be better at my job. I will be less likely to feel depressed and commit suicide (6 times less likely). I will be less likely to die of heart disease or cancer. Other people will respect me I will be less likely to get into trouble with the police. The possibility that I will die of liver disease will be dramatically reduced (12 times less likely). It will be less likely that I will die in a car accident (3 times less likely).   Strategies for Cutting Down Keep Track.  Find a way to keep track of how much you drink.  If you make a note of each drink before you drink it, this will help slow you down. Count and Measure.  Know the standard drink sizes.  Ask the bartender or server about the  amount of alcohol  in a mixed drink. Set Goals.  Decide how many days a week you will drink and how many drinks each day. Pace and Space.  When you do drink, pace yourself.  Have no more than one drink with alcohol per hour.  Alternate "drink spacers" non-alcoholic drinks such as water, soda, or juice with drinks containing alcohol. Include Food.  Don't drink on an empty stomach.  Have some food so the alcohol will be absorbed more slowly into your system.  Avoid Triggers.  Avoid people, places, or activities that have led to drinking in the past.  Certain times of day or feelings may also be triggers.  Make a plan so you will know what you can do instead of drinking. Plan to Handle Urges.  When an urge hits, consider these options:  Remind yourself of your reasons for changing.  Or talk it through with someone you trust. Or get involved with a healthy, distracting activity.  Or, "urge surf" - instead of fighting the feeling, accept it and ride it out, knowing it will soon crest like a wave and pass. Know Your "No".  Have a polite, convincing "no thanks" for those times when you may be offered a drink and don't want one.  The faster you can say no to these offers, the less likely you are to give in.  If you hesitate, it allows you time to think of excuses to go along.       10 LITTLE Things To Do When You're Feeling Too Down To Do Anything  Take a shower. Even if you plan to stay in all day long and not see a soul, take a shower. It takes the most effort to hop in to the shower but once you do, you'll feel immediate results. It will wake you up and you'll be feeling much fresher (and cleaner too).  Brush and floss your teeth. Give your teeth a good brushing with a floss finish. It's a small task but it feels so good and you can check 'taking care of your health' off the list of things to do.  Do something small on your list. Most of [place] have some small thing we would like to get done (load of  laundry, sew a button, email a friend). Doing one of these things will make you feel like you've accomplished something.  Drink water. Drinking water is easy right? It's also really beneficial for your health so keep a glass beside you all day and take sips often. It gives you energy and prevents you from boredom eating.  Do some floor exercises. The last thing you want to do is exercise but it might be just the thing you need the most. Keep it simple and do exercises that involve sitting or laying on the floor. Even the smallest of exercises release chemicals in the brain that make you feel good. Yoga stretches or core exercises are going to make you feel good with minimal effort.  Make your bed. Making your bed takes a few minutes but it's productive and you'll feel relieved when it's done. An unmade bed is a huge visual reminder that you're having an unproductive day. Do it and consider it your housework for the day.  Put on some nice clothes. Take the sweatpants off even if you don't plan to go anywhere. Put on clothes that make you feel good. Take a look in the mirror so your brain recognizes the sweatpants have been replaced with clothes that make  you look great. It's an instant confidence booster.  Wash the dishes. A pile of dirty dishes in the sink is a reflection of your mood. It's possible that if you wash up the dishes, your mood will follow suit. It's worth a try.  Cook a real meal. If you have the luxury to have a "do nothing" day, you have time to make a real meal for yourself. Make a meal that you love to eat. The process is good to get you out of the funk and the food will ensure you have more energy for tomorrow.  Write out your thoughts by hand. When you hand write, you stimulate your brain to focus on the moment that you're in so make yourself comfortable and write whatever comes into your mind. Put those thoughts out on paper so they stop spinning around in your head. Those  thoughts might be the very thing holding you down.  Follow up goal

## 2022-10-31 NOTE — Patient Instructions (Signed)
Visit Information  Craig Schmidt was given information about Medicaid Managed Care team care coordination services as a part of their Healthy [facility] Medicaid benefit. Craig Schmidt verbally consented to engagement with the [facility] Managed Care team.   If you are experiencing a medical emergency, please call 911 or report to your local emergency department or urgent care.   If you have a non-emergency medical problem during routine business hours, please contact your provider's office and ask to speak with a nurse.   For questions related to your Healthy [facility] health plan, please call: [phone] or visit the homepage here: [email or web address]  If you would like to schedule transportation through your Healthy [facility] plan, please call the following number at least 2 days in advance of your appointment: [phone]  For information about your ride after you set it up, call Ride Assist at [phone]. Use this number to activate a Will Call pickup, or if your transportation is late for a scheduled pickup. Use this number, too, if you need to make a change or cancel a previously scheduled reservation.  If you need transportation services right away, call [phone]. The after-hours call center is staffed 24 hours to handle ride assistance and urgent reservation requests (including discharges) 365 days a year. Urgent trips include sick visits, hospital discharge requests and life-sustaining treatment.  Call the [facility] Line at [phone], at any time, 24 hours a day, 7 days a week. If you are in danger or need immediate medical attention call 911.  If you would like help to quit smoking, call 1-800-QUIT-NOW (206-370-1746) OR Espaol: 1-855-Djelo-Ya (1-275-170-0174) o para ms informacin haga clic aqu or Text READY to 944-967 to register via text  Craig Schmidt,   Please see education materials related to HF and health  maintenance provided by MyChart link.  Patient verbalizes understanding of instructions and care plan provided today and agrees to view in MyChart. Active MyChart status and patient understanding of how to access instructions and care plan via MyChart confirmed with patient.     Telephone follow up appointment with Managed Medicaid care management team member scheduled for:12/03/22 @ 2:30pm  Craig Emms RN, BSN [place]  Managed [facility] RN Care Coordinator [phone]   Following is a copy of your plan of care:  Care Plan : RN Care Manager Plan of Care  Updates made by Heidi Dach, RN since 10/31/2022 12:00 AM     Problem: Health Management needs related to CHF      Long-Range Goal: Development of Plan of Care to address Health Management needs related to CHF   Start Date: 10/26/2022  Expected End Date: 01/24/2023  Note:   Current Barriers:  Chronic Disease Management support and education needs related to CHFMr. Schmidt has not scheduled appointments at this time. He has followed up on disability determination, his letter has been lost and he has contacted his attorney to follow up. He would like permanent housing to have a place to store medications. He has difficulty managing his medications as he does not have a stable place to keep them secured.  RNCM Clinical Goal(s):  Patient will verbalize understanding of plan for management of CHF as evidenced by patient reports attend all scheduled medical appointments: schedule with Dr. Shirlee Latch and PCP as evidenced by provider documentation        continue to work with RN Care Manager and/or Social Worker to address care management and care coordination needs related to CHF as evidenced by  adherence to CM Team Scheduled appointments     through collaboration with RN Care manager, provider, and care team.   Interventions: Inter-disciplinary care team collaboration (see longitudinal plan of care) Evaluation of current treatment plan  related to  self management and patient's adherence to plan as established by provider Provided patient with medical transportation provided by [facility] [phone] the importance of calling ahead to schedule transportation, and the importance of arriving at least 15 minutes early to all appointments Provided patient with [facility] [phone] for an eye exam-has not scheduled eye exam Provided patient with [facility] [place] waiting on call back to schedule unless a new referral is needed Discussed update with [facility] [phone]-patient has an appointment on 11/07/22 at 11:30am with [name] Provided therapeutic listening Advised patient to schedule follow up with PCP and Cardiology-he has contact information Advised to continue working with BSW for resources   Heart Failure Interventions:  (Status: New goal.)  Long Term Goal  Wt Readings from Last 3 Encounters:  09/12/22 151 lb 11.2 oz (68.8 kg)  08/31/22 146 lb 3.2 oz (66.3 kg)  08/30/22 148 lb 12.8 oz (67.5 kg)   Basic overview and discussion of pathophysiology of Heart Failure reviewed Provided education on low sodium diet Reviewed role of diuretics in prevention of fluid overload and management of heart failure Discussed the importance of keeping all appointments with provider Assessed social determinant of health barriers  Patient Goals/Self-Care Activities: Attend all scheduled provider appointments Call provider office for new concerns or questions  Work with the social worker to address care coordination needs and will continue to work with the clinical team to address health care and disease management related needs

## 2022-11-01 ENCOUNTER — Other Ambulatory Visit: Payer: Medicaid Other | Admitting: Licensed Clinical Social Worker

## 2022-11-01 NOTE — Patient Outreach (Signed)
  Medicaid Managed Care   Unsuccessful Attempt Note   11/01/2022 Name: Craig Schmidt MRN: [id number] DOB: [birth year]  Referred by: Rema Fendt, NP Reason for referral : No chief complaint on file.   An unsuccessful telephone outreach was attempted today. The patient was referred to the case management team for assistance with care management and care coordination.  Phone call made twice. Number is not in working services.   Follow Up Plan: The Managed Medicaid care management team will reach out to the patient again over the next 30 days.   Dickie La, BSW, MSW, [employer] Managed Medicaid LCSW [facility]  [email or web address] Phone: [phone]

## 2022-11-06 DIAGNOSIS — I10 Essential (primary) hypertension: Secondary | ICD-10-CM | POA: Diagnosis not present

## 2022-11-06 DIAGNOSIS — R079 Chest pain, unspecified: Secondary | ICD-10-CM | POA: Diagnosis not present

## 2022-11-06 DIAGNOSIS — R0789 Other chest pain: Secondary | ICD-10-CM | POA: Diagnosis not present

## 2022-11-09 ENCOUNTER — Telehealth: Payer: Self-pay

## 2022-11-09 NOTE — Telephone Encounter (Signed)
..   Medicaid Managed Care   Unsuccessful Outreach Note  11/09/2022 Name: Craig Schmidt MRN: [id number] DOB: [birth year]  Referred by: Rema Fendt, NP Reason for referral : Appointment (I attempted to reach this patient today to get his  missed phone appointment with the [facility] LCSW rescheduled. His number was not in order.)   A second unsuccessful telephone outreach was attempted today. The patient was referred to the case management team for assistance with care management and care coordination.   Follow Up Plan: The care management team will reach out to the patient again over the next 7 days.   Weston Settle Care Guide  [facility]  [facility]  [phone]

## 2022-11-14 ENCOUNTER — Telehealth: Payer: Self-pay

## 2022-11-14 NOTE — Telephone Encounter (Signed)
..   Medicaid Managed Care   Unsuccessful Outreach Note  11/14/2022 Name: Craig Schmidt MRN: [id number] DOB: [birth year]  Referred by: Rema Fendt, NP Reason for referral : Appointment   Third unsuccessful telephone outreach was attempted today. The patient was referred to the case management team for assistance with care management and care coordination. The patient's primary care provider has been notified of our unsuccessful attempts to make or maintain contact with the patient. The care management team is pleased to engage with this patient at any time in the future should he/she be interested in assistance from the care management team.   Follow Up Plan: We have been unable to make contact with the patient for follow up. The care management team is available to follow up with the patient after provider conversation with the patient regarding recommendation for care management engagement and subsequent re-referral to the care management team.   Weston Settle Care Guide  [facility] Managed  [facility]  [phone]

## 2022-11-20 DIAGNOSIS — I10 Essential (primary) hypertension: Secondary | ICD-10-CM | POA: Diagnosis not present

## 2022-11-20 DIAGNOSIS — R0902 Hypoxemia: Secondary | ICD-10-CM | POA: Diagnosis not present

## 2022-11-20 DIAGNOSIS — R062 Wheezing: Secondary | ICD-10-CM | POA: Diagnosis not present

## 2022-11-28 ENCOUNTER — Encounter (HOSPITAL_COMMUNITY): Payer: Self-pay

## 2022-11-28 ENCOUNTER — Other Ambulatory Visit: Payer: Self-pay

## 2022-11-28 ENCOUNTER — Inpatient Hospital Stay (HOSPITAL_COMMUNITY)
Admission: EM | Admit: 2022-11-28 | Discharge: 2022-12-05 | DRG: 291 | Disposition: A | Discharge status: Home / Self Care | Payer: Medicaid Other | Attending: Internal Medicine | Admitting: Internal Medicine

## 2022-11-28 ENCOUNTER — Emergency Department (HOSPITAL_COMMUNITY): Payer: Medicaid Other

## 2022-11-28 DIAGNOSIS — Z91010 Allergy to peanuts: Secondary | ICD-10-CM

## 2022-11-28 DIAGNOSIS — I110 Hypertensive heart disease with heart failure: Secondary | ICD-10-CM | POA: Diagnosis not present

## 2022-11-28 DIAGNOSIS — N1832 Chronic kidney disease, stage 3b: Secondary | ICD-10-CM | POA: Diagnosis present

## 2022-11-28 DIAGNOSIS — R42 Dizziness and giddiness: Secondary | ICD-10-CM | POA: Diagnosis not present

## 2022-11-28 DIAGNOSIS — J4489 Other specified chronic obstructive pulmonary disease: Secondary | ICD-10-CM | POA: Diagnosis present

## 2022-11-28 DIAGNOSIS — Z609 Problem related to social environment, unspecified: Secondary | ICD-10-CM | POA: Diagnosis present

## 2022-11-28 DIAGNOSIS — G8929 Other chronic pain: Secondary | ICD-10-CM | POA: Diagnosis present

## 2022-11-28 DIAGNOSIS — F17200 Nicotine dependence, unspecified, uncomplicated: Secondary | ICD-10-CM | POA: Diagnosis present

## 2022-11-28 DIAGNOSIS — F17210 Nicotine dependence, cigarettes, uncomplicated: Secondary | ICD-10-CM | POA: Diagnosis present

## 2022-11-28 DIAGNOSIS — I5042 Chronic combined systolic (congestive) and diastolic (congestive) heart failure: Secondary | ICD-10-CM | POA: Diagnosis present

## 2022-11-28 DIAGNOSIS — F1410 Cocaine abuse, uncomplicated: Secondary | ICD-10-CM | POA: Diagnosis present

## 2022-11-28 DIAGNOSIS — I5043 Acute on chronic combined systolic (congestive) and diastolic (congestive) heart failure: Principal | ICD-10-CM | POA: Diagnosis present

## 2022-11-28 DIAGNOSIS — J690 Pneumonitis due to inhalation of food and vomit: Secondary | ICD-10-CM | POA: Diagnosis not present

## 2022-11-28 DIAGNOSIS — F1010 Alcohol abuse, uncomplicated: Secondary | ICD-10-CM | POA: Diagnosis present

## 2022-11-28 DIAGNOSIS — F1910 Other psychoactive substance abuse, uncomplicated: Secondary | ICD-10-CM | POA: Diagnosis not present

## 2022-11-28 DIAGNOSIS — I69351 Hemiplegia and hemiparesis following cerebral infarction affecting right dominant side: Secondary | ICD-10-CM

## 2022-11-28 DIAGNOSIS — I493 Ventricular premature depolarization: Secondary | ICD-10-CM | POA: Diagnosis not present

## 2022-11-28 DIAGNOSIS — I050 Rheumatic mitral stenosis: Secondary | ICD-10-CM | POA: Diagnosis present

## 2022-11-28 DIAGNOSIS — Z560 Unemployment, unspecified: Secondary | ICD-10-CM

## 2022-11-28 DIAGNOSIS — F1210 Cannabis abuse, uncomplicated: Secondary | ICD-10-CM | POA: Diagnosis present

## 2022-11-28 DIAGNOSIS — Z5900 Homelessness unspecified: Secondary | ICD-10-CM

## 2022-11-28 DIAGNOSIS — Z5982 Transportation insecurity: Secondary | ICD-10-CM

## 2022-11-28 DIAGNOSIS — I249 Acute ischemic heart disease, unspecified: Secondary | ICD-10-CM | POA: Diagnosis present

## 2022-11-28 DIAGNOSIS — I252 Old myocardial infarction: Secondary | ICD-10-CM

## 2022-11-28 DIAGNOSIS — Z91013 Allergy to seafood: Secondary | ICD-10-CM

## 2022-11-28 DIAGNOSIS — E871 Hypo-osmolality and hyponatremia: Secondary | ICD-10-CM | POA: Diagnosis present

## 2022-11-28 DIAGNOSIS — R0989 Other specified symptoms and signs involving the circulatory and respiratory systems: Secondary | ICD-10-CM | POA: Diagnosis not present

## 2022-11-28 DIAGNOSIS — E785 Hyperlipidemia, unspecified: Secondary | ICD-10-CM | POA: Diagnosis present

## 2022-11-28 DIAGNOSIS — R531 Weakness: Secondary | ICD-10-CM

## 2022-11-28 DIAGNOSIS — R062 Wheezing: Secondary | ICD-10-CM | POA: Diagnosis not present

## 2022-11-28 DIAGNOSIS — R609 Edema, unspecified: Secondary | ICD-10-CM | POA: Diagnosis not present

## 2022-11-28 DIAGNOSIS — I255 Ischemic cardiomyopathy: Secondary | ICD-10-CM | POA: Diagnosis present

## 2022-11-28 DIAGNOSIS — Z6823 Body mass index (BMI) 23.0-23.9, adult: Secondary | ICD-10-CM

## 2022-11-28 DIAGNOSIS — Z79899 Other long term (current) drug therapy: Secondary | ICD-10-CM

## 2022-11-28 DIAGNOSIS — Z5986 Financial insecurity: Secondary | ICD-10-CM

## 2022-11-28 DIAGNOSIS — E440 Moderate protein-calorie malnutrition: Secondary | ICD-10-CM | POA: Insufficient documentation

## 2022-11-28 DIAGNOSIS — R0789 Other chest pain: Secondary | ICD-10-CM | POA: Diagnosis not present

## 2022-11-28 DIAGNOSIS — Z833 Family history of diabetes mellitus: Secondary | ICD-10-CM

## 2022-11-28 DIAGNOSIS — S2231XA Fracture of one rib, right side, initial encounter for closed fracture: Secondary | ICD-10-CM | POA: Diagnosis not present

## 2022-11-28 DIAGNOSIS — Z5941 Food insecurity: Secondary | ICD-10-CM

## 2022-11-28 DIAGNOSIS — Z91148 Patient's other noncompliance with medication regimen for other reason: Secondary | ICD-10-CM | POA: Diagnosis not present

## 2022-11-28 DIAGNOSIS — I10 Essential (primary) hypertension: Secondary | ICD-10-CM | POA: Diagnosis present

## 2022-11-28 DIAGNOSIS — I272 Pulmonary hypertension, unspecified: Secondary | ICD-10-CM | POA: Diagnosis present

## 2022-11-28 DIAGNOSIS — I2489 Other forms of acute ischemic heart disease: Secondary | ICD-10-CM | POA: Diagnosis present

## 2022-11-28 DIAGNOSIS — R079 Chest pain, unspecified: Secondary | ICD-10-CM | POA: Diagnosis not present

## 2022-11-28 DIAGNOSIS — R000 Tachycardia, unspecified: Secondary | ICD-10-CM | POA: Diagnosis not present

## 2022-11-28 DIAGNOSIS — I2510 Atherosclerotic heart disease of native coronary artery without angina pectoris: Secondary | ICD-10-CM | POA: Diagnosis present

## 2022-11-28 DIAGNOSIS — I130 Hypertensive heart and chronic kidney disease with heart failure and stage 1 through stage 4 chronic kidney disease, or unspecified chronic kidney disease: Principal | ICD-10-CM | POA: Diagnosis present

## 2022-11-28 DIAGNOSIS — N179 Acute kidney failure, unspecified: Secondary | ICD-10-CM | POA: Diagnosis present

## 2022-11-28 DIAGNOSIS — M549 Dorsalgia, unspecified: Secondary | ICD-10-CM | POA: Diagnosis present

## 2022-11-28 DIAGNOSIS — G929 Unspecified toxic encephalopathy: Secondary | ICD-10-CM | POA: Diagnosis present

## 2022-11-28 HISTORY — DX: Cocaine abuse, uncomplicated: F14.10

## 2022-11-28 HISTORY — DX: ST elevation (STEMI) myocardial infarction of unspecified site: I21.3

## 2022-11-28 HISTORY — DX: Homelessness unspecified: Z59.00

## 2022-11-28 HISTORY — DX: Patient's other noncompliance with medication regimen for other reason: Z91.148

## 2022-11-28 HISTORY — DX: Atherosclerotic heart disease of native coronary artery without angina pectoris: I25.10

## 2022-11-28 HISTORY — DX: Chronic systolic (congestive) heart failure: I50.22

## 2022-11-28 HISTORY — DX: Chronic kidney disease, stage 3 unspecified: N18.30

## 2022-11-28 HISTORY — DX: Alcohol abuse, uncomplicated: F10.10

## 2022-11-28 LAB — COMPREHENSIVE METABOLIC PANEL
ALT: 14 U/L (ref 0–44)
AST: 35 U/L (ref 15–41)
Albumin: 2.3 g/dL — ABNORMAL LOW (ref 3.5–5.0)
Alkaline Phosphatase: 81 U/L (ref 38–126)
Anion gap: 10 (ref 5–15)
BUN: 17 mg/dL (ref 6–20)
CO2: 18 mmol/L — ABNORMAL LOW (ref 22–32)
Calcium: 7.6 mg/dL — ABNORMAL LOW (ref 8.9–10.3)
Chloride: 106 mmol/L (ref 98–111)
Creatinine, Ser: 1.69 mg/dL — ABNORMAL HIGH (ref 0.61–1.24)
GFR, Estimated: 49 mL/min — ABNORMAL LOW (ref >60)
Glucose, Bld: 107 mg/dL — ABNORMAL HIGH (ref 70–99)
Potassium: 3.5 mmol/L (ref 3.5–5.1)
Sodium: 134 mmol/L — ABNORMAL LOW (ref 135–145)
Total Bilirubin: 1 mg/dL (ref 0.3–1.2)
Total Protein: 5 g/dL — ABNORMAL LOW (ref 6.5–8.1)

## 2022-11-28 LAB — BRAIN NATRIURETIC PEPTIDE: B Natriuretic Peptide: 2810.5 pg/mL — ABNORMAL HIGH (ref 0.0–100.0)

## 2022-11-28 LAB — TROPONIN I (HIGH SENSITIVITY)
Troponin I (High Sensitivity): 175 ng/L (ref <18)
Troponin I (High Sensitivity): 311 ng/L (ref <18)
Troponin I (High Sensitivity): 267 ng/L (ref <18)

## 2022-11-28 LAB — CBC WITH DIFFERENTIAL/PLATELET
Abs Immature Granulocytes: 0.06 10*3/uL (ref 0.00–0.07)
Basophils Absolute: 0 10*3/uL (ref 0.0–0.1)
Basophils Relative: 0 %
Eosinophils Absolute: 0.1 10*3/uL (ref 0.0–0.5)
Eosinophils Relative: 1 %
HCT: 32.6 % — ABNORMAL LOW (ref 39.0–52.0)
Hemoglobin: 10.7 g/dL — ABNORMAL LOW (ref 13.0–17.0)
Immature Granulocytes: 1 %
Lymphocytes Relative: 12 %
Lymphs Abs: 1.3 10*3/uL (ref 0.7–4.0)
MCH: 31.3 pg (ref 26.0–34.0)
MCHC: 32.8 g/dL (ref 30.0–36.0)
MCV: 95.3 fL (ref 80.0–100.0)
Monocytes Absolute: 0.8 10*3/uL (ref 0.1–1.0)
Monocytes Relative: 8 %
Neutro Abs: 8.4 10*3/uL — ABNORMAL HIGH (ref 1.7–7.7)
Neutrophils Relative %: 78 %
Platelets: 189 10*3/uL (ref 150–400)
RBC: 3.42 MIL/uL — ABNORMAL LOW (ref 4.22–5.81)
RDW: 14.1 % (ref 11.5–15.5)
WBC: 10.6 10*3/uL — ABNORMAL HIGH (ref 4.0–10.5)
nRBC: 0 % (ref 0.0–0.2)

## 2022-11-28 LAB — HIV ANTIBODY (ROUTINE TESTING W REFLEX): HIV Screen 4th Generation wRfx: NONREACTIVE

## 2022-11-28 LAB — LIPID PANEL
Cholesterol: 118 mg/dL (ref 0–200)
HDL: 27 mg/dL — ABNORMAL LOW (ref >40)
LDL Cholesterol: 79 mg/dL (ref 0–99)
Total CHOL/HDL Ratio: 4.4 RATIO
Triglycerides: 60 mg/dL (ref <150)
VLDL: 12 mg/dL (ref 0–40)

## 2022-11-28 LAB — TSH: TSH: 1.196 u[IU]/mL (ref 0.350–4.500)

## 2022-11-28 LAB — HEMOGLOBIN A1C
Hgb A1c MFr Bld: 5.4 % (ref 4.8–5.6)
Mean Plasma Glucose: 108.28 mg/dL

## 2022-11-28 LAB — APTT: aPTT: 34 seconds (ref 24–36)

## 2022-11-28 LAB — HEPARIN LEVEL (UNFRACTIONATED): Heparin Unfractionated: 0.64 IU/mL (ref 0.30–0.70)

## 2022-11-28 MED ORDER — ACETAMINOPHEN 650 MG RE SUPP: 650.0000 mg | Freq: Four times a day (QID) | RECTAL | Status: DC | PRN

## 2022-11-28 MED ORDER — BISACODYL 5 MG PO TBEC
5.0000 mg | DELAYED_RELEASE_TABLET | Freq: Every day | ORAL | Status: DC | PRN
  Filled 2022-11-28: qty 1

## 2022-11-28 MED ORDER — LORAZEPAM 2 MG/ML IJ SOLN
1.0000 mg | INTRAMUSCULAR | Status: DC | PRN
  Administered 2022-11-29: 2 mg via INTRAVENOUS
  Filled 2022-11-28 (×2): qty 1

## 2022-11-28 MED ORDER — MORPHINE SULFATE (PF) 2 MG/ML IV SOLN
2.0000 mg | INTRAVENOUS | Status: DC | PRN
  Administered 2022-11-29 – 2022-11-30 (×2): 2 mg via INTRAVENOUS
  Filled 2022-11-28 (×3): qty 1

## 2022-11-28 MED ORDER — ATORVASTATIN CALCIUM 40 MG PO TABS
40.0000 mg | ORAL_TABLET | Freq: Every day | ORAL | Status: DC
  Administered 2022-11-28 – 2022-12-05 (×8): 40 mg via ORAL
  Filled 2022-11-28 (×8): qty 1

## 2022-11-28 MED ORDER — ONDANSETRON HCL 4 MG PO TABS
4.0000 mg | ORAL_TABLET | Freq: Four times a day (QID) | ORAL | Status: DC | PRN
  Filled 2022-11-28: qty 1

## 2022-11-28 MED ORDER — THIAMINE HCL 100 MG/ML IJ SOLN
100.0000 mg | Freq: Every day | INTRAMUSCULAR | Status: DC
  Filled 2022-11-28 (×2): qty 2

## 2022-11-28 MED ORDER — ONDANSETRON HCL 4 MG/2ML IJ SOLN: 4.0000 mg | Freq: Four times a day (QID) | INTRAMUSCULAR | Status: DC | PRN

## 2022-11-28 MED ORDER — ACETAMINOPHEN 325 MG PO TABS
650.0000 mg | ORAL_TABLET | Freq: Four times a day (QID) | ORAL | Status: DC | PRN
  Administered 2022-11-28 – 2022-12-05 (×5): 650 mg via ORAL
  Filled 2022-11-28 (×6): qty 2

## 2022-11-28 MED ORDER — DICYCLOMINE HCL 20 MG PO TABS: 20.0000 mg | ORAL_TABLET | Freq: Four times a day (QID) | ORAL | Status: DC | PRN

## 2022-11-28 MED ORDER — LORAZEPAM 1 MG PO TABS: 1.0000 mg | ORAL_TABLET | ORAL | Status: DC | PRN

## 2022-11-28 MED ORDER — CARVEDILOL 3.125 MG PO TABS
3.1250 mg | ORAL_TABLET | Freq: Two times a day (BID) | ORAL | Status: DC
  Administered 2022-11-28 – 2022-12-05 (×14): 3.125 mg via ORAL
  Filled 2022-11-28 (×14): qty 1

## 2022-11-28 MED ORDER — THIAMINE MONONITRATE 100 MG PO TABS
100.0000 mg | ORAL_TABLET | Freq: Every day | ORAL | Status: DC
  Administered 2022-11-28 – 2022-12-05 (×8): 100 mg via ORAL
  Filled 2022-11-28 (×8): qty 1

## 2022-11-28 MED ORDER — HYDRALAZINE HCL 20 MG/ML IJ SOLN
5.0000 mg | INTRAMUSCULAR | Status: DC | PRN
  Filled 2022-11-28: qty 1

## 2022-11-28 MED ORDER — FOLIC ACID 1 MG PO TABS
1.0000 mg | ORAL_TABLET | Freq: Every day | ORAL | Status: DC
  Administered 2022-11-28 – 2022-12-05 (×8): 1 mg via ORAL
  Filled 2022-11-28 (×8): qty 1

## 2022-11-28 MED ORDER — AZITHROMYCIN 250 MG PO TABS
500.0000 mg | ORAL_TABLET | Freq: Once | ORAL | Status: AC
  Administered 2022-11-28: 500 mg via ORAL
  Filled 2022-11-28: qty 2

## 2022-11-28 MED ORDER — LOPERAMIDE HCL 2 MG PO CAPS
2.0000 mg | ORAL_CAPSULE | ORAL | Status: AC | PRN
  Filled 2022-11-28: qty 1

## 2022-11-28 MED ORDER — AMOXICILLIN-POT CLAVULANATE 875-125 MG PO TABS
1.0000 | ORAL_TABLET | Freq: Once | ORAL | Status: AC
  Administered 2022-11-28: 1 via ORAL
  Filled 2022-11-28: qty 1

## 2022-11-28 MED ORDER — METHOCARBAMOL 500 MG PO TABS
500.0000 mg | ORAL_TABLET | Freq: Three times a day (TID) | ORAL | Status: AC | PRN
  Administered 2022-11-28 – 2022-12-03 (×9): 500 mg via ORAL
  Filled 2022-11-28 (×9): qty 1

## 2022-11-28 MED ORDER — HEPARIN (PORCINE) 25000 UT/250ML-% IV SOLN
1050.0000 [IU]/h | INTRAVENOUS | Status: DC
  Administered 2022-11-28: 900 [IU]/h via INTRAVENOUS
  Filled 2022-11-28: qty 250

## 2022-11-28 MED ORDER — ADULT MULTIVITAMIN W/MINERALS CH
1.0000 | ORAL_TABLET | Freq: Every day | ORAL | Status: DC
  Administered 2022-11-28 – 2022-12-05 (×8): 1 via ORAL
  Filled 2022-11-28 (×8): qty 1

## 2022-11-28 MED ORDER — ISOSORB DINITRATE-HYDRALAZINE 20-37.5 MG PO TABS
0.5000 | ORAL_TABLET | Freq: Three times a day (TID) | ORAL | Status: DC
  Administered 2022-11-28 – 2022-12-04 (×17): 0.5 via ORAL
  Filled 2022-11-28 (×17): qty 1

## 2022-11-28 MED ORDER — SODIUM CHLORIDE 0.9% FLUSH
3.0000 mL | Freq: Two times a day (BID) | INTRAVENOUS | Status: DC
  Administered 2022-11-28 – 2022-12-05 (×11): 3 mL via INTRAVENOUS

## 2022-11-28 MED ORDER — POLYETHYLENE GLYCOL 3350 17 G PO PACK
17.0000 g | PACK | Freq: Every day | ORAL | Status: DC | PRN
  Filled 2022-11-28: qty 1

## 2022-11-28 MED ORDER — NICOTINE 21 MG/24HR TD PT24
21.0000 mg | MEDICATED_PATCH | Freq: Every day | TRANSDERMAL | Status: DC
  Administered 2022-11-28 – 2022-12-05 (×8): 21 mg via TRANSDERMAL
  Filled 2022-11-28 (×8): qty 1

## 2022-11-28 MED ORDER — FUROSEMIDE 10 MG/ML IJ SOLN
40.0000 mg | Freq: Two times a day (BID) | INTRAMUSCULAR | Status: DC
  Administered 2022-11-28 – 2022-12-02 (×9): 40 mg via INTRAVENOUS
  Filled 2022-11-28 (×10): qty 4

## 2022-11-28 MED ORDER — CLOPIDOGREL BISULFATE 75 MG PO TABS
75.0000 mg | ORAL_TABLET | Freq: Every day | ORAL | Status: DC
  Administered 2022-11-28 – 2022-12-05 (×8): 75 mg via ORAL
  Filled 2022-11-28 (×8): qty 1

## 2022-11-28 MED ORDER — FUROSEMIDE 10 MG/ML IJ SOLN
40.0000 mg | Freq: Once | INTRAMUSCULAR | Status: AC
  Administered 2022-11-28: 40 mg via INTRAVENOUS

## 2022-11-28 MED ORDER — LIDOCAINE 5 % EX PTCH
1.0000 | MEDICATED_PATCH | CUTANEOUS | Status: DC
  Administered 2022-11-28 – 2022-12-04 (×7): 1 via TRANSDERMAL
  Filled 2022-11-28 (×7): qty 1

## 2022-11-28 MED ORDER — HYDROXYZINE HCL 25 MG PO TABS
25.0000 mg | ORAL_TABLET | Freq: Four times a day (QID) | ORAL | Status: AC | PRN
  Administered 2022-11-28 – 2022-12-03 (×7): 25 mg via ORAL
  Filled 2022-11-28 (×7): qty 1

## 2022-11-28 MED ORDER — DOCUSATE SODIUM 100 MG PO CAPS
100.0000 mg | ORAL_CAPSULE | Freq: Two times a day (BID) | ORAL | Status: DC
  Administered 2022-11-28 – 2022-12-05 (×12): 100 mg via ORAL
  Filled 2022-11-28 (×14): qty 1

## 2022-11-28 NOTE — ED Provider Notes (Signed)
[place] EMERGENCY DEPARTMENT AT [facility] Provider Note   CSN: [id number] Arrival date & time: 11/28/22  1135     History  Chief Complaint  Patient presents with   Shortness of Breath    Craig Schmidt is a 52 y.o. male.  HPI Patient presents via EMS with concern for dyspnea.  Patient has a history of multiple medical problems, states that he did not have access to his medication for some time, but found them again today, and thus took diuretics.  However, the patient notes that over the past weeks he has had increasing dyspnea, lower extremity edema, and occasional left-sided chest pain. Patient is homeless. Patient was found today by fire department after complaining of dyspnea, while on the ground outside of a grocery store. Currently the patient denies other focal complaints.    Home Medications Prior to Admission medications   Medication Sig Start Date End Date Taking? Authorizing Provider  apixaban (ELIQUIS) 5 MG TABS tablet Take 1 tablet (5 mg total) by mouth 2 (two) times daily. Patient not taking: Reported on 10/26/2022 12/27/21   Tonye Becket D, NP  atorvastatin (LIPITOR) 40 MG tablet TAKE 1 TABLET(40 MG) BY MOUTH DAILY Patient not taking: Reported on 10/26/2022 04/23/22   Laurey Morale, MD  carvedilol (COREG) 3.125 MG tablet Take 1 tablet (3.125 mg total) by mouth 2 (two) times daily. Patient not taking: Reported on 10/26/2022 09/06/22   Allayne Butcher, [name]  clopidogrel (PLAVIX) 75 MG tablet TAKE ONE TABLET BY MOUTH ONCE DAILY Patient not taking: Reported on 10/26/2022 07/10/22   Tonye Becket D, NP  digoxin (LANOXIN) 0.125 MG tablet TAKE ONE TABLET BY MOUTH ONCE DAILY Patient not taking: Reported on 10/26/2022 08/17/22   Tonye Becket D, NP  famotidine (PEPCID) 20 MG tablet TAKE ONE TABLET BY MOUTH ONCE DAILY Patient not taking: Reported on 10/26/2022 08/17/22   Tonye Becket D, NP  Glycerin-Hypromellose-PEG 400 0.2-0.2-1 % SOLN Place 1 drop into both eyes as  needed. Patient not taking: Reported on 10/26/2022 12/27/21   Clegg, [name], NP  isosorbide-hydrALAZINE (BIDIL) 20-37.5 MG tablet Take 0.5 tablets by mouth 3 (three) times daily. Take 1 & 1/2 tablets by mouth 3 times daily. Patient not taking: Reported on 09/06/2022 08/28/22   Jacklynn Ganong, [name]  JARDIANCE 10 MG TABS tablet TAKE ONE TABLET BY MOUTH ONCE DAILY Patient not taking: Reported on 10/26/2022 08/17/22   Tonye Becket D, NP  lamoTRIgine (LAMICTAL) 25 MG tablet Take 1 tablet (25 mg total) by mouth daily for 14 days. Then increase to 50 mg daily. 06/13/22 07/10/22  Lamar Sprinkles, MD  lamoTRIgine (LAMICTAL) 25 MG tablet Take 2 tablets (50 mg total) by mouth daily for 14 days. 06/28/22 07/12/22  Lamar Sprinkles, MD  potassium chloride SA (KLOR-CON M) 20 MEQ tablet Take 1 tablet (20 mEq total) by mouth daily. Patient not taking: Reported on 10/26/2022 07/10/22   Jacklynn Ganong, [name]  sacubitril-valsartan (ENTRESTO) 24-26 MG Take 1 tablet by mouth 2 (two) times daily. Patient not taking: Reported on 10/26/2022 08/28/22   Jacklynn Ganong, [name]  spironolactone (ALDACTONE) 25 MG tablet Take 0.5 tablets (12.5 mg total) by mouth at bedtime. Patient not taking: Reported on 10/26/2022 09/06/22   Robbie Lis M, [name]  torsemide (DEMADEX) 20 MG tablet Take 1 tablet (20 mg total) by mouth daily. Patient not taking: Reported on 10/26/2022 07/10/22   Jacklynn Ganong, [name]  triamcinolone ointment (KENALOG) 0.5 % APPLY TOPICALLY TO  THE AFFECTED AREA TWICE DAILY Patient not taking: Reported on 10/26/2022 04/02/22   Rema Fendt, NP      Allergies    Shellfish allergy and Peanut-containing drug products    Review of Systems   Review of Systems  All other systems reviewed and are negative.   Physical Exam Updated Vital Signs BP 113/76   Pulse (!) 104   Temp 99 F (37.2 C)   Resp (!) 28   Ht 5\' 7"  (1.702 m)   Wt 68.5 kg   SpO2 91%   BMI 23.65 kg/m  Physical Exam Vitals and nursing note reviewed.   Constitutional:      General: He is not in acute distress.    Appearance: He is well-developed.  HENT:     Head: Normocephalic and atraumatic.  Eyes:     Conjunctiva/sclera: Conjunctivae normal.  Cardiovascular:     Rate and Rhythm: Normal rate and regular rhythm.  Pulmonary:     Effort: Pulmonary effort is normal. No respiratory distress.     Breath sounds: No stridor.  Abdominal:     General: There is no distension.  Musculoskeletal:     Right lower leg: Edema present.     Left lower leg: Edema present.  Skin:    General: Skin is warm and dry.  Neurological:     Mental Status: He is alert and oriented to person, place, and time.     ED Results / Procedures / Treatments   Labs (all labs ordered are listed, but only abnormal results are displayed) Labs Reviewed  COMPREHENSIVE METABOLIC PANEL - Abnormal; Notable for the following components:      Result Value   Sodium 134 (*)    CO2 18 (*)    Glucose, Bld 107 (*)    Creatinine, Ser 1.69 (*)    Calcium 7.6 (*)    Total Protein 5.0 (*)    Albumin 2.3 (*)    GFR, Estimated 49 (*)    All other components within normal limits  CBC WITH DIFFERENTIAL/PLATELET - Abnormal; Notable for the following components:   WBC 10.6 (*)    RBC 3.42 (*)    Hemoglobin 10.7 (*)    HCT 32.6 (*)    Neutro Abs 8.4 (*)    All other components within normal limits  BRAIN NATRIURETIC PEPTIDE - Abnormal; Notable for the following components:   B Natriuretic Peptide 2,810.5 (*)    All other components within normal limits  TROPONIN I (HIGH SENSITIVITY) - Abnormal; Notable for the following components:   Troponin I (High Sensitivity) 175 (*)    All other components within normal limits  HEPARIN LEVEL (UNFRACTIONATED)  APTT  HIV ANTIBODY (ROUTINE TESTING W REFLEX)  RAPID URINE DRUG SCREEN, HOSP PERFORMED    EKG EKG Interpretation  Date/Time:  Wednesday Nov 28 2022 11:40:38 EDT Ventricular Rate:  102 PR Interval:  135 QRS  Duration: 106 QT Interval:  397 QTC Calculation: 518 R Axis:   54 Text Interpretation: Sinus tachycardia Biatrial enlargement T wave abnormality Abnormal ECG Confirmed by Gerhard Munch [id number]) on 11/28/2022 11:41:49 AM  Radiology DG Chest 2 View  Result Date: 11/28/2022 CLINICAL DATA:  Left chest pain with shortness of breath. Rhonchi in the upper and lower left lung. Wheezing on the right. EXAM: CHEST - 2 VIEW COMPARISON:  08/03/2022 FINDINGS: Cardiac enlargement. No vascular congestion. Consolidation demonstrated in the left lower lung likely representing pneumonia. Follow-up to resolution is recommended to exclude central obstructing process. Right  lung is clear. No pleural effusions. No pneumothorax. Mediastinal contours appear intact. Old right rib fracture. IMPRESSION: 1. Consolidation in the left lower lung likely representing pneumonia. 2. Mild cardiac enlargement. No vascular congestion or edema demonstrated. Electronically Signed   By: Burman Nieves M.D.   On: 11/28/2022 12:31    Procedures Procedures    Medications Ordered in ED Medications  furosemide (LASIX) injection 40 mg (has no administration in time range)  heparin ADULT infusion 100 units/mL (25000 units/266mL) (has no administration in time range)  atorvastatin (LIPITOR) tablet 40 mg (has no administration in time range)  furosemide (LASIX) injection 40 mg (has no administration in time range)  sodium chloride flush (NS) 0.9 % injection 3 mL (has no administration in time range)  acetaminophen (TYLENOL) tablet 650 mg (has no administration in time range)    Or  acetaminophen (TYLENOL) suppository 650 mg (has no administration in time range)  docusate sodium (COLACE) capsule 100 mg (has no administration in time range)  polyethylene glycol (MIRALAX / GLYCOLAX) packet 17 g (has no administration in time range)  bisacodyl (DULCOLAX) EC tablet 5 mg (has no administration in time range)  ondansetron (ZOFRAN) tablet 4 mg  (has no administration in time range)    Or  ondansetron (ZOFRAN) injection 4 mg (has no administration in time range)  nicotine (NICODERM CQ - dosed in mg/24 hours) patch 21 mg (has no administration in time range)  hydrALAZINE (APRESOLINE) injection 5 mg (has no administration in time range)  lidocaine (LIDODERM) 5 % 1 patch (has no administration in time range)  azithromycin (ZITHROMAX) tablet 500 mg (500 mg Oral Given 11/28/22 1344)  amoxicillin-clavulanate (AUGMENTIN) 875-125 MG per tablet 1 tablet (1 tablet Oral Given 11/28/22 1344)    ED Course/ Medical Decision Making/ A&P                             Medical Decision Making Patient with multiple medical problems, per chart review including substance abuse, heart failure, hypertension, homelessness now presents with lower extremity edema, dyspnea, occasional left-sided chest pain.  Broad differential including ACS, heart failure, pneumonia, bacteremia, sepsis all considered.   Amount and/or Complexity of Data Reviewed Independent Historian: EMS External Data Reviewed: notes. Labs: ordered. Decision-making details documented in ED Course. Radiology: ordered and independent interpretation performed. Decision-making details documented in ED Course. ECG/medicine tests: ordered and independent interpretation performed. Decision-making details documented in ED Course.  Risk Prescription drug management. Decision regarding hospitalization.  EMS ECG sinus rhythm, rate 95, T wave abnormalities, abnormal EKG. On monitor here patient has sinus rhythm, 90s, sinus, T wave abnormalities similar to EMS rhythm strip, abnormal Pulse ox 99% room air normal  4:59 PM Patient's remaining labs notable for troponin of 175.  I discussed this case with her cardiology colleagues and will follow as a consulting team.  I also discussed the patient's case with our internal medicine colleagues given the patient's presentation concern for shortness of breath.   Suspicion for CHF being dominant etiology, but with complication of possible pneumonia and given patient's homelessness, he was started on antibiotics. Elevated troponin, notable but the EKG is nonischemic, but with suspicion for demand ischemia patient will start heparin as well. Patient required admission for further monitoring, management.        Final Clinical Impression(s) / ED Diagnoses Final diagnoses:  Acute on chronic combined systolic and diastolic heart failure (HCC)  CRITICAL CARE Performed by: Gerhard Munch Total  critical care time: 45 minutes Critical care time was exclusive of separately billable procedures and treating other patients. Critical care was necessary to treat or prevent imminent or life-threatening deterioration. Critical care was time spent personally by me on the following activities: development of treatment plan with patient and/or surrogate as well as nursing, discussions with consultants, evaluation of patient's response to treatment, examination of patient, obtaining history from patient or surrogate, ordering and performing treatments and interventions, ordering and review of laboratory studies, ordering and review of radiographic studies, pulse oximetry and re-evaluation of patient's condition.    Gerhard Munch, MD 11/28/22 1700

## 2022-11-28 NOTE — Progress Notes (Addendum)
ANTICOAGULATION CONSULT NOTE - Initial Consult  Pharmacy Consult for heparin Indication: chest pain/ACS and history of embolic CVA  Allergies  Allergen Reactions   Shellfish Allergy Anaphylaxis   Peanut-Containing Drug Products Itching    Patient Measurements:   Heparin Dosing Weight: 68.5 kg  Vital Signs: Temp: 99 F (37.2 C) (05/08 1344) Temp Source: Oral (05/08 1141) BP: 133/91 (05/08 1200) Pulse Rate: 101 (05/08 1200)  Labs: Recent Labs    11/28/22 1155 11/28/22 1348  HGB 10.7*  --   HCT 32.6*  --   PLT 189  --   CREATININE 1.69*  --   TROPONINIHS  --  175*    CrCl cannot be calculated (Unknown ideal weight.).   Medical History: Past Medical History:  Diagnosis Date   Asthma    Brain tumor (HCC)    MI (myocardial infarction) (HCC)    Stroke (HCC)     Medications:  (Not in a hospital admission)  Scheduled:   furosemide  40 mg Intravenous Once   Infusions:   Assessment: Patient presented with acute chest pain and shortness of breath. Noted to have a hisotry of STEMI and CVA thought to be embolic in nature; prescribed Eliquis. Patient was not taking Eliquis or other meds PTA, however reported that he found his medications and took them this morning for the first time in months.   Troponins are mildly elevated at 175, possibly 2/2 demand ischemia with PNA. CBC stable. CrCl is 49 ml/min.  Goal of Therapy:  Heparin level 0.3-0.7 units/ml aPTT 66-102 seconds Monitor platelets by anticoagulation protocol: Yes   Plan:  No bolus given Eliquis dose this AM Start heparin at 900 units/hr Heparin level and aPTT in 6 hours Daily heparin level and aPTT until correlation is confirmed. Suspect it may correlate right away with only one Eliquis dose Daily CBC  Thank you for involving pharmacy in this patient's care.  Enos Fling, PharmD PGY2 Pharmacy Resident 11/28/2022 4:04 PM

## 2022-11-28 NOTE — ED Triage Notes (Signed)
Pt BIB [facility] from parking lot for chest pain, shortness of breath on left side. EMS reports rhonchi in upper and lower on left, wheezing on right. Lower extremity edema as well, new prescription for lasix. Ems administered 2 duonebs en route, minimal improvement. O2 100% after duo nebs. A&O x4, ambulatory, 6/10 tightness on left chest.

## 2022-11-28 NOTE — H&P (Signed)
History and Physical    Patient: Craig Schmidt:[id number] DOB: 1971-02-07 DOA: 11/28/2022 DOS: the patient was seen and examined on 11/28/2022 PCP: Rema Fendt, NP  Patient coming from: Homeless; NOK: Maia Petties, [phone]   Chief Complaint: CP/SOB  HPI: Craig Schmidt is a 52 y.o. male with medical history significant of asthma, CAD, CVA, chronic combined CHF (ER 30-35% and grade 2 DD in 03/2022), and brain tumor presenting with CP/SOB.  He reports not having access to medications for at least the last 4 months.  He has been having chronic back pain.  He started with chest pain and SOB over the last day.  He is currently complaining only of back pain.  No significant cough.  LE edema was present on arrival but is improved after Lasix.    ER Course:  Homeless, h/o CHF and not taking meds.  Cardiology will see.  Diuresed 1+L, given IV + PO Lasix.  Given treatment for CAP.  Wants to go home tomorrow.     Review of Systems: As mentioned in the history of present illness. All other systems reviewed and are negative. Past Medical History:  Diagnosis Date   Asthma    Brain tumor [facility])    MI (myocardial infarction) (HCC)    Stroke [facility])    Past Surgical History:  Procedure Laterality Date   BUBBLE STUDY  09/11/2021   Procedure: BUBBLE STUDY;  Surgeon: Pricilla Riffle, MD;  Location: [facility] ENDOSCOPY;  Service: Cardiovascular;;   LEFT HEART CATH AND CORONARY ANGIOGRAPHY N/A 12/19/2021   Procedure: LEFT HEART CATH AND CORONARY ANGIOGRAPHY;  Surgeon: Lyn Records, MD;  Location: [facility] INVASIVE CV LAB;  Service: Cardiovascular;  Laterality: N/A;   RIGHT/LEFT HEART CATH AND CORONARY ANGIOGRAPHY N/A 04/13/2022   Procedure: RIGHT/LEFT HEART CATH AND CORONARY ANGIOGRAPHY;  Surgeon: Laurey Morale, MD;  Location: [facility] INVASIVE CV LAB;  Service: Cardiovascular;  Laterality: N/A;   TEE WITHOUT CARDIOVERSION N/A 09/11/2021   Procedure: TRANSESOPHAGEAL ECHOCARDIOGRAM (TEE);   Surgeon: Pricilla Riffle, MD;  Location: [facility] ENDOSCOPY;  Service: Cardiovascular;  Laterality: N/A;   Social History:  reports that he has been smoking cigarettes. He has a 41.00 pack-year smoking history. He has been exposed to tobacco smoke. He has never used smokeless tobacco. He reports current alcohol use of about 63.0 standard drinks of alcohol per week. He reports current drug use. Drugs: "Crack" cocaine and Cocaine.  Allergies  Allergen Reactions   Shellfish Allergy Anaphylaxis   Peanut-Containing Drug Products Itching    Family History  Problem Relation Age of Onset   Diabetes Mother     Prior to Admission medications   Medication Sig Start Date End Date Taking? Authorizing Provider  apixaban (ELIQUIS) 5 MG TABS tablet Take 1 tablet (5 mg total) by mouth 2 (two) times daily. Patient not taking: Reported on 10/26/2022 12/27/21   Tonye Becket D, NP  atorvastatin (LIPITOR) 40 MG tablet TAKE 1 TABLET(40 MG) BY MOUTH DAILY Patient not taking: Reported on 10/26/2022 04/23/22   Laurey Morale, MD  carvedilol (COREG) 3.125 MG tablet Take 1 tablet (3.125 mg total) by mouth 2 (two) times daily. Patient not taking: Reported on 10/26/2022 09/06/22   Allayne Butcher, [name]  clopidogrel (PLAVIX) 75 MG tablet TAKE ONE TABLET BY MOUTH ONCE DAILY Patient not taking: Reported on 10/26/2022 07/10/22   Tonye Becket D, NP  digoxin (LANOXIN) 0.125 MG tablet TAKE ONE TABLET BY MOUTH ONCE DAILY Patient not taking:  Reported on 10/26/2022 08/17/22   Tonye Becket D, NP  famotidine (PEPCID) 20 MG tablet TAKE ONE TABLET BY MOUTH ONCE DAILY Patient not taking: Reported on 10/26/2022 08/17/22   Tonye Becket D, NP  Glycerin-Hypromellose-PEG 400 0.2-0.2-1 % SOLN Place 1 drop into both eyes as needed. Patient not taking: Reported on 10/26/2022 12/27/21   Clegg, [name], NP  isosorbide-hydrALAZINE (BIDIL) 20-37.5 MG tablet Take 0.5 tablets by mouth 3 (three) times daily. Take 1 & 1/2 tablets by mouth 3 times daily. Patient not taking:  Reported on 09/06/2022 08/28/22   Jacklynn Ganong, [name]  JARDIANCE 10 MG TABS tablet TAKE ONE TABLET BY MOUTH ONCE DAILY Patient not taking: Reported on 10/26/2022 08/17/22   Tonye Becket D, NP  lamoTRIgine (LAMICTAL) 25 MG tablet Take 1 tablet (25 mg total) by mouth daily for 14 days. Then increase to 50 mg daily. 06/13/22 07/10/22  Lamar Sprinkles, MD  lamoTRIgine (LAMICTAL) 25 MG tablet Take 2 tablets (50 mg total) by mouth daily for 14 days. 06/28/22 07/12/22  Lamar Sprinkles, MD  meloxicam (MOBIC) 7.5 MG tablet TAKE ONE TABLET BY MOUTH ONCE DAILY Patient not taking: Reported on 10/26/2022 08/20/22   Jacklynn Ganong, [name]  potassium chloride SA (KLOR-CON M) 20 MEQ tablet Take 1 tablet (20 mEq total) by mouth daily. Patient not taking: Reported on 10/26/2022 07/10/22   Jacklynn Ganong, [name]  sacubitril-valsartan (ENTRESTO) 24-26 MG Take 1 tablet by mouth 2 (two) times daily. Patient not taking: Reported on 10/26/2022 08/28/22   Jacklynn Ganong, [name]  spironolactone (ALDACTONE) 25 MG tablet Take 0.5 tablets (12.5 mg total) by mouth at bedtime. Patient not taking: Reported on 10/26/2022 09/06/22   Robbie Lis M, [name]  torsemide (DEMADEX) 20 MG tablet Take 1 tablet (20 mg total) by mouth daily. Patient not taking: Reported on 10/26/2022 07/10/22   Jacklynn Ganong, [name]  triamcinolone ointment (KENALOG) 0.5 % APPLY TOPICALLY TO THE AFFECTED AREA TWICE DAILY Patient not taking: Reported on 10/26/2022 04/02/22   Rema Fendt, NP    Physical Exam: Vitals:   11/28/22 1200 11/28/22 1344 11/28/22 1545 11/28/22 1700  BP: (!) 133/91  113/76 (!) 134/95  Pulse: (!) 101  (!) 104   Resp: (!) 29  (!) 28 19  Temp:  99 F (37.2 C)  98.5 F (36.9 C)  TempSrc:    Oral  SpO2: 95%  91%   Weight:   68.5 kg   Height:   5\' 7"  (1.702 m)    General:  Appears disheveled,  uncomfortable, reporting back pain Eyes:  EOMI, normal lids, iris ENT:  grossly normal hearing, lips & tongue, mmm Neck:  no LAD, masses or  thyromegaly Cardiovascular:  RR with mild tachycardia, no m/r/g. Trace LE edema.  Respiratory:   CTA bilaterally with no wheezes/rales/rhonchi.  Mildly increased respiratory effort. Abdomen:  soft, NT, ND Skin:  no rash or induration seen on limited exam Musculoskeletal:  grossly normal tone BUE/BLE, good ROM, no bony abnormality Lower extremity:  No LE edema.  Limited foot exam with no ulcerations.  2+ distal pulses. Psychiatric:  blunted mood and affect, speech fluent and appropriate, AOx3 Neurologic:  CN 2-12 grossly intact, moves all extremities in coordinated fashion   Radiological Exams on Admission: Independently reviewed - see discussion in A/P where applicable  DG Chest 2 View  Result Date: 11/28/2022 CLINICAL DATA:  Left chest pain with shortness of breath. Rhonchi in the upper and lower left lung. Wheezing on the  right. EXAM: CHEST - 2 VIEW COMPARISON:  08/03/2022 FINDINGS: Cardiac enlargement. No vascular congestion. Consolidation demonstrated in the left lower lung likely representing pneumonia. Follow-up to resolution is recommended to exclude central obstructing process. Right lung is clear. No pleural effusions. No pneumothorax. Mediastinal contours appear intact. Old right rib fracture. IMPRESSION: 1. Consolidation in the left lower lung likely representing pneumonia. 2. Mild cardiac enlargement. No vascular congestion or edema demonstrated. Electronically Signed   By: Burman Nieves M.D.   On: 11/28/2022 12:31    EKG: Independently reviewed.  Sinus tachycardia with rate 102; prolonged QTc 518; nonspecific ST changes with no evidence of acute ischemia   Labs on Admission: I have personally reviewed the available labs and imaging studies at the time of the admission.  Pertinent labs:    CO2 18 BUN 17/Creatinine 1.69/GFR 49 Albumin 2.3 BNP 2810.5 HS troponin 27, 175 WBC 10.6 Hgb 10.7   Assessment and Plan: Principal Problem:   Acute on chronic combined systolic  (congestive) and diastolic (congestive) heart failure (HCC) Active Problems:   Homeless   Cocaine abuse (HCC)   Smoker   Alcohol abuse   ACS (acute coronary syndrome) (HCC)   Back pain   Essential hypertension   Dyslipidemia   Noncompliance with medication regimen    ACS/Acute on chronic combined CHF -Patient with chest pain/SOB that came on after months of medication noncompliance -Symptoms suggestive of ACS, but this could also be related to CHF exacerbation  -CXR read as PNA but this does not appear to be consistent with his clinical situation  -Initial HS troponin minimally elevated; repeat with very positive delta; will continue to trend.   -EKG with no apparent STEMI. -Will plan to observe at [facility] on telemetry to further evaluate for ACS.  -Risk factor stratification with HgbA1c and FLP; will also check TSH and UDS -Cardiology consultation requested -Start heparin drip -NTG for symptom relief (although there is no mortality benefit) -Resume Plavix -Hold Jardiance for now -Elevated BNP  -Will request echocardiogram -Will give morphine for severe pain  Back pain -He is complaining of left-sided back pain - which is in the area where reported infiltrate is -Possibly pulm infarct instead? -Pain control for now with Tylenol, lidoderm -Consider further imaging if needed once cardiac issues are addressed  HTN -resume Bidil, Coreg -Will also add prn hydralazine  HLD -resume Lipitor  Polysubstance abuse -Acknowledges ETOH, cocaine, marijuana -Cessation encouraged; this should be encouraged on an ongoing basis -UDS ordered  -Will order both CIWA and COWS protocols -TOC team consult  Tobacco dependence -Encourage cessation.   -This was discussed with the patient and should be reviewed on an ongoing basis.   -Patch ordered   Homelessness -Will consult TOC team to offer resources     Advance Care Planning:   Code Status: Full Code   Consults: Cardiology; CHF  navigator; PT/OT; nutrition; TOC team  DVT Prophylaxis: Heparin drip  Family Communication: None present  Severity of Illness: The appropriate patient status for this patient is OBSERVATION. Observation status is judged to be reasonable and necessary in order to provide the required intensity of service to ensure the patient's safety. The patient's presenting symptoms, physical exam findings, and initial radiographic and laboratory data in the context of their medical condition is felt to place them at decreased risk for further clinical deterioration. Furthermore, it is anticipated that the patient will be medically stable for discharge from the hospital within 2 midnights of admission.   Author: Jonah Blue,  MD 11/28/2022 5:43 PM  For on call review [email or web address].

## 2022-11-28 NOTE — ED Notes (Signed)
ED TO INPATIENT HANDOFF REPORT  ED Nurse Name and Phone #: Donny Pique, RN [phone]  S Name/Age/Gender Craig Schmidt 52 y.o. male Room/Bed: 008C/008C  Code Status   Code Status: Prior  Home/SNF/Other Patient is homeless, he manages well and has been homeless for 30+ years Patient oriented to: self, place, time, and situation Is this baseline? Yes   Triage Complete: Triage complete  Chief Complaint Acute on chronic combined systolic (congestive) and diastolic (congestive) heart failure (HCC) [I50.43]  Triage Note Pt BIB [facility] from parking lot for chest pain, shortness of breath on left side. EMS reports rhonchi in upper and lower on left, wheezing on right. Lower extremity edema as well, new prescription for lasix. Ems administered 2 duonebs en route, minimal improvement. O2 100% after duo nebs. A&O x4, ambulatory, 6/10 tightness on left chest.   Allergies Allergies  Allergen Reactions   Shellfish Allergy Anaphylaxis   Peanut-Containing Drug Products Itching    Level of Care/Admitting Diagnosis ED Disposition     ED Disposition  Admit   Condition  --   Comment  Hospital Area: [facility] [[id number]]  Level of Care: Telemetry Cardiac [[id number]]  May place patient in observation at [facility] or [facility] if equivalent level of care is available:: No  Covid Evaluation: Asymptomatic - no recent exposure (last 10 days) testing not required  Diagnosis: Acute on chronic combined systolic (congestive) and diastolic (congestive) heart failure [facility]) [[id number]]  Admitting Physician: Jonah Blue [[id number]]  Attending Physician: Jonah Blue [[id number]]          B Medical/Surgery History Past Medical History:  Diagnosis Date   Asthma    Brain tumor (HCC)    MI (myocardial infarction) (HCC)    Stroke [facility])    Past Surgical History:  Procedure Laterality Date   BUBBLE STUDY  09/11/2021   Procedure: BUBBLE STUDY;  Surgeon: Pricilla Riffle, MD;  Location: [facility] ENDOSCOPY;   Service: Cardiovascular;;   LEFT HEART CATH AND CORONARY ANGIOGRAPHY N/A 12/19/2021   Procedure: LEFT HEART CATH AND CORONARY ANGIOGRAPHY;  Surgeon: Lyn Records, MD;  Location: [facility] INVASIVE CV LAB;  Service: Cardiovascular;  Laterality: N/A;   RIGHT/LEFT HEART CATH AND CORONARY ANGIOGRAPHY N/A 04/13/2022   Procedure: RIGHT/LEFT HEART CATH AND CORONARY ANGIOGRAPHY;  Surgeon: Laurey Morale, MD;  Location: [facility] INVASIVE CV LAB;  Service: Cardiovascular;  Laterality: N/A;   TEE WITHOUT CARDIOVERSION N/A 09/11/2021   Procedure: TRANSESOPHAGEAL ECHOCARDIOGRAM (TEE);  Surgeon: Pricilla Riffle, MD;  Location: [facility] ENDOSCOPY;  Service: Cardiovascular;  Laterality: N/A;     A IV Location/Drains/Wounds Patient Lines/Drains/Airways Status     Active Line/Drains/Airways     None            Intake/Output Last 24 hours No intake or output data in the 24 hours ending 11/28/22 1604  Labs/Imaging Results for orders placed or performed during the hospital encounter of 11/28/22 (from the past 48 hour(s))  Brain natriuretic peptide     Status: Abnormal   Collection Time: 11/28/22 11:52 AM  Result Value Ref Range   B Natriuretic Peptide 2,810.5 (H) 0.0 - 100.0 pg/mL    Comment: Performed at [facility] Lab, [address], [place], [place] [address]  Comprehensive metabolic panel     Status: Abnormal   Collection Time: 11/28/22 11:55 AM  Result Value Ref Range   Sodium 134 (L) 135 - 145 mmol/L   Potassium 3.5 3.5 - 5.1 mmol/L   Chloride 106 98 -  111 mmol/L   CO2 18 (L) 22 - 32 mmol/L   Glucose, Bld 107 (H) 70 - 99 mg/dL    Comment: Glucose reference range applies only to samples taken after fasting for at least 8 hours.   BUN 17 6 - 20 mg/dL   Creatinine, Ser 2.13 (H) 0.61 - 1.24 mg/dL   Calcium 7.6 (L) 8.9 - 10.3 mg/dL   Total Protein 5.0 (L) 6.5 - 8.1 g/dL   Albumin 2.3 (L) 3.5 - 5.0 g/dL   AST 35 15 - 41 U/L   ALT 14 0 - 44 U/L   Alkaline Phosphatase 81 38 - 126 U/L   Total Bilirubin  1.0 0.3 - 1.2 mg/dL   GFR, Estimated 49 (L) >60 mL/min    Comment: (NOTE) Calculated using the CKD-EPI Creatinine Equation (2021)    Anion gap 10 5 - 15    Comment: Performed at [facility] Lab, [address] [place], [place] [address]  CBC with Differential     Status: Abnormal   Collection Time: 11/28/22 11:55 AM  Result Value Ref Range   WBC 10.6 (H) 4.0 - 10.5 K/uL   RBC 3.42 (L) 4.22 - 5.81 MIL/uL   Hemoglobin 10.7 (L) 13.0 - 17.0 g/dL   HCT 84.6 (L) 96.2 - 95.2 %   MCV 95.3 80.0 - 100.0 fL   MCH 31.3 26.0 - 34.0 pg   MCHC 32.8 30.0 - 36.0 g/dL   RDW 84.1 32.4 - 40.1 %   Platelets 189 150 - 400 K/uL   nRBC 0.0 0.0 - 0.2 %   Neutrophils Relative % 78 %   Neutro Abs 8.4 (H) 1.7 - 7.7 K/uL   Lymphocytes Relative 12 %   Lymphs Abs 1.3 0.7 - 4.0 K/uL   Monocytes Relative 8 %   Monocytes Absolute 0.8 0.1 - 1.0 K/uL   Eosinophils Relative 1 %   Eosinophils Absolute 0.1 0.0 - 0.5 K/uL   Basophils Relative 0 %   Basophils Absolute 0.0 0.0 - 0.1 K/uL   Immature Granulocytes 1 %   Abs Immature Granulocytes 0.06 0.00 - 0.07 K/uL    Comment: Performed at [facility] Lab, [address] [place], [place] [address]  Troponin I (High Sensitivity)     Status: Abnormal   Collection Time: 11/28/22  1:48 PM  Result Value Ref Range   Troponin I (High Sensitivity) 175 (HH) <18 ng/L    Comment: CRITICAL RESULT CALLED TO, READ BACK BY AND VERIFIED WITH P.Lorenna Lurry RN [id number] 11/28/22 MCCORMICK K (NOTE) Elevated high sensitivity troponin I (hsTnI) values and significant  changes across serial measurements may suggest ACS but many other  chronic and acute conditions are known to elevate hsTnI results.  Refer to the "Links" section for chest pain algorithms and additional  guidance. Performed at [facility], [address], [place], [place] [address]    DG Chest 2 View  Result Date: 11/28/2022 CLINICAL DATA:  Left chest pain with shortness of breath. Rhonchi in the upper and  lower left lung. Wheezing on the right. EXAM: CHEST - 2 VIEW COMPARISON:  08/03/2022 FINDINGS: Cardiac enlargement. No vascular congestion. Consolidation demonstrated in the left lower lung likely representing pneumonia. Follow-up to resolution is recommended to exclude central obstructing process. Right lung is clear. No pleural effusions. No pneumothorax. Mediastinal contours appear intact. Old right rib fracture. IMPRESSION: 1. Consolidation in the left lower lung likely representing pneumonia. 2. Mild cardiac enlargement. No vascular congestion or  edema demonstrated. Electronically Signed   By: Burman Nieves M.D.   On: 11/28/2022 12:31    Pending Labs Unresulted Labs (From admission, onward)     Start     Ordered   11/29/22 0500  APTT  Daily,   R      11/28/22 1602   11/29/22 0500  Heparin level (unfractionated)  Daily,   R      11/28/22 1602   11/29/22 0500  CBC  Daily,   R      11/28/22 1602   11/28/22 2200  Heparin level (unfractionated)  Once-Timed,   TIMED        11/28/22 1602   11/28/22 2200  APTT  Once-Timed,   TIMED        11/28/22 1602            Vitals/Pain Today's Vitals   11/28/22 1143 11/28/22 1200 11/28/22 1344 11/28/22 1545  BP:  (!) 133/91  113/76  Pulse:  (!) 101  (!) 104  Resp:  (!) 29  (!) 28  Temp:   99 F (37.2 C)   TempSrc:      SpO2:  95%  91%  Weight:    68.5 kg  Height:    5\' 7"  (1.702 m)  PainSc: 6        Isolation Precautions No active isolations  Medications Medications  furosemide (LASIX) injection 40 mg (has no administration in time range)  heparin ADULT infusion 100 units/mL (25000 units/288mL) (has no administration in time range)  azithromycin (ZITHROMAX) tablet 500 mg (500 mg Oral Given 11/28/22 1344)  amoxicillin-clavulanate (AUGMENTIN) 875-125 MG per tablet 1 tablet (1 tablet Oral Given 11/28/22 1344)    Mobility walks     Focused Assessments    R Recommendations: See Admitting Provider Note  Report given to:    Additional Notes: Patient is A&Ox4, ambulatory but struggles b/c he is so fluid overloaded. His BNP is elevated and so is his trop so they are starting heparin. He's had minimal complaints for [place] and had issues with medication compliance because it was lost d/t him being homeless.

## 2022-11-28 NOTE — Progress Notes (Signed)
ANTICOAGULATION CONSULT NOTE   Pharmacy Consult for heparin Indication: chest pain/ACS and history of embolic CVA  Allergies  Allergen Reactions   Shellfish Allergy Anaphylaxis   Peanut-Containing Drug Products Itching    Patient Measurements: Height: 5\' 7"  (170.2 cm) Weight: 68.5 kg (151 lb) IBW/kg (Calculated) : 66.1 Heparin Dosing Weight: 68.5 kg  Vital Signs: Temp: 97.8 F (36.6 C) (05/08 1928) Temp Source: Oral (05/08 1928) BP: 121/86 (05/08 2216) Pulse Rate: 97 (05/08 2216)  Labs: Recent Labs    11/28/22 1155 11/28/22 1348 11/28/22 1743 11/28/22 2238  HGB 10.7*  --   --   --   HCT 32.6*  --   --   --   PLT 189  --   --   --   APTT  --   --   --  34  HEPARINUNFRC  --   --   --  0.64  CREATININE 1.69*  --   --   --   TROPONINIHS  --  175* 311* 267*     Estimated Creatinine Clearance: 48.9 mL/min (A) (by C-G formula based on SCr of 1.69 mg/dL (H)).   Medical History: Past Medical History:  Diagnosis Date   Alcohol abuse    Asthma    Brain tumor (HCC)    CAD (coronary artery disease)    Chronic HFrEF (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)    Chronic kidney disease, stage 3 (HCC)    Cocaine abuse (HCC)    History of medication noncompliance    Homelessness    MI (myocardial infarction) (HCC)    STEMI (ST elevation myocardial infarction) (HCC)    Stroke (HCC)     Medications:  Medications Prior to Admission  Medication Sig Dispense Refill Last Dose   apixaban (ELIQUIS) 5 MG TABS tablet Take 1 tablet (5 mg total) by mouth 2 (two) times daily. 60 tablet 5    atorvastatin (LIPITOR) 40 MG tablet TAKE 1 TABLET(40 MG) BY MOUTH DAILY 30 tablet 11    carvedilol (COREG) 3.125 MG tablet Take 1 tablet (3.125 mg total) by mouth 2 (two) times daily. 60 tablet 5    clopidogrel (PLAVIX) 75 MG tablet TAKE ONE TABLET BY MOUTH ONCE DAILY 30 tablet 6    digoxin (LANOXIN) 0.125 MG tablet TAKE ONE TABLET BY MOUTH ONCE DAILY 30 tablet 6    famotidine  (PEPCID) 20 MG tablet TAKE ONE TABLET BY MOUTH ONCE DAILY 30 tablet 6    Glycerin-Hypromellose-PEG 400 0.2-0.2-1 % SOLN Place 1 drop into both eyes as needed. 15 mL 0    isosorbide-hydrALAZINE (BIDIL) 20-37.5 MG tablet Take 0.5 tablets by mouth 3 (three) times daily. Take 1 & 1/2 tablets by mouth 3 times daily. 115 tablet 5    JARDIANCE 10 MG TABS tablet TAKE ONE TABLET BY MOUTH ONCE DAILY 30 tablet 6    lamoTRIgine (LAMICTAL) 25 MG tablet Take 1 tablet (25 mg total) by mouth daily for 14 days. Then increase to 50 mg daily. 14 tablet 0    lamoTRIgine (LAMICTAL) 25 MG tablet Take 2 tablets (50 mg total) by mouth daily for 14 days. 28 tablet 0    potassium chloride SA (KLOR-CON M) 20 MEQ tablet Take 1 tablet (20 mEq total) by mouth daily. 30 tablet 11    sacubitril-valsartan (ENTRESTO) 24-26 MG Take 1 tablet by mouth 2 (two) times daily. 60 tablet 3    spironolactone (ALDACTONE) 25 MG tablet Take 0.5 tablets (12.5 mg total) by mouth at  bedtime. 45 tablet 3    torsemide (DEMADEX) 20 MG tablet Take 1 tablet (20 mg total) by mouth daily. 30 tablet 11    triamcinolone ointment (KENALOG) 0.5 % APPLY TOPICALLY TO THE AFFECTED AREA TWICE DAILY 60 g 2    Scheduled:   atorvastatin  40 mg Oral Daily   carvedilol  3.125 mg Oral BID   clopidogrel  75 mg Oral Daily   docusate sodium  100 mg Oral BID   folic acid  1 mg Oral Daily   furosemide  40 mg Intravenous BID   isosorbide-hydrALAZINE  0.5 tablet Oral TID   lidocaine  1 patch Transdermal Q24H   multivitamin with minerals  1 tablet Oral Daily   nicotine  21 mg Transdermal Daily   sodium chloride flush  3 mL Intravenous Q12H   thiamine  100 mg Oral Daily   Or   thiamine  100 mg Intravenous Daily   Infusions:   heparin 900 Units/hr (11/28/22 1830)    Assessment: Patient presented with acute chest pain and shortness of breath. Noted to have a hisotry of STEMI and CVA thought to be embolic in nature; prescribed Eliquis. Patient was not taking  Eliquis or other meds PTA, however reported that he found his medications and took them this morning for the first time in months.   Initial heparin level 0.64 and aPTT 34 not correlating. RN reports the infusion was paused for ~1 hour while lab tried to collect sample. Unfortunately the patient only has access on the one arm for infusion and lab draws. In addition to lab was collected ~4 hours from the initiation of the infusion without a bolus. No overt s/sx of bleeding reported. Will continue current rate.  Goal of Therapy:  Heparin level 0.3-0.7 units/ml aPTT 66-102 seconds Monitor platelets by anticoagulation protocol: Yes   Plan:  No bolus given Eliquis dose 5/8 AM Continue heparin gtt at 900 units/hr Heparin level and aPTT in 8 hours Daily heparin level and aPTT until correlation is confirmed.  Daily CBC  Thank you for involving pharmacy in this patient's care.  Ruben Im, PharmD Clinical Pharmacist 11/28/2022 11:53 PM Please check AMION for all [facility] numbers

## 2022-11-28 NOTE — ED Notes (Signed)
Patient transported to X-ray 

## 2022-11-28 NOTE — Consult Note (Signed)
Cardiology Consultation   Patient ID: Craig Schmidt MRN: [id number]; DOB: May 20, 1971  Admit date: 11/28/2022 Date of Consult: 11/28/2022  PCP:  Rema Fendt, NP    HeartCare Providers Cardiologist:  [name], MD  Advanced Heart Failure:  Marca Ancona, MD       Patient Profile:   Craig Schmidt is a 52 y.o. male with a hx of chronic systolic CHF dx 08/2021 when he presented with stroke requiring TNK, moderate mitral valve stenosis, inferolateral STEMI 11/2021 with 100% apical LAD suspected embolic versus plaque rupture with thrombosis, homelessness, cocaine abuse, ETOH abuse, THC use,  left vestibular schwannoma, CKD 3a, medication noncompliance who is being seen 11/28/2022 for the evaluation of CHF at the request of Dr. Ophelia Charter.  History of Present Illness:   Craig Schmidt has extremely complex history above, followed by CHF team. Initially seen for low EF during stroke admission 08/2021 felt due to cardiomyopathy - cardiomyopathy felt likely due to substance abuse +/- uncontrolled HTN. He was started on Coumadin but later switched to Eliquis due to compliance concerns given social situation. He was then admitted 11/2021 with inferolateral STEMI with 100% apical LAD felt embolic vs plaque rupture. He was started on Plavix with recommendation to continue 1 year post MI. EF was <20% and he was severely volume overloaded. He was diuresed but Cr trended up with soft BP. Had some concern for low output but Co-Ox was within normal range. Last echo 03/2022 showed EF 30-35%, global HK, G2 DD, mildly elevated PASP, moderate LAE, moderate mitral stenosis. RHC at that time showed primarily pulmonary venous HTN, normal RA pressure, minimally elevated PCWP, moderate mitral stenosis. At last OV 08/2022, reported to be missing meds about 50% of the time, living in a motel otherwise homeless. Subsequent notes from outreach indicate it was difficult to reach patient - was supposed to f/u 3-4  weeks but did not attend.  He returned this admission with worsening chest pain (described as left chest wall pain) and SOB over the last day in the setting of being out of his medicines for several weeks. hBNP 2810, hsTropon 175, WBC 10.6, Hgb 10.7, Na 134, Cr 1.69, calcium 7.6 correcting to normal for albumin of 2.3. CXR shows consolidation of left lower lung likely representing PNA, + mild cardiac enlargement, no vascular congestion or edema. Cardiology asked to see for CHF and elevated troponin.   Past Medical History:  Diagnosis Date   Alcohol abuse    Asthma    Brain tumor (HCC)    CAD (coronary artery disease)    Chronic HFrEF (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)    Chronic kidney disease, stage 3 (HCC)    Cocaine abuse (HCC)    History of medication noncompliance    Homelessness    MI (myocardial infarction) (HCC)    STEMI (ST elevation myocardial infarction) (HCC)    Stroke [facility])     Past Surgical History:  Procedure Laterality Date   BUBBLE STUDY  09/11/2021   Procedure: BUBBLE STUDY;  Surgeon: Pricilla Riffle, MD;  Location: [facility] ENDOSCOPY;  Service: Cardiovascular;;   LEFT HEART CATH AND CORONARY ANGIOGRAPHY N/A 12/19/2021   Procedure: LEFT HEART CATH AND CORONARY ANGIOGRAPHY;  Surgeon: Lyn Records, MD;  Location: [facility] INVASIVE CV LAB;  Service: Cardiovascular;  Laterality: N/A;   RIGHT/LEFT HEART CATH AND CORONARY ANGIOGRAPHY N/A 04/13/2022   Procedure: RIGHT/LEFT HEART CATH AND CORONARY ANGIOGRAPHY;  Surgeon: Laurey Morale, MD;  Location:  [facility] INVASIVE CV LAB;  Service: Cardiovascular;  Laterality: N/A;   TEE WITHOUT CARDIOVERSION N/A 09/11/2021   Procedure: TRANSESOPHAGEAL ECHOCARDIOGRAM (TEE);  Surgeon: Pricilla Riffle, MD;  Location: [facility] ENDOSCOPY;  Service: Cardiovascular;  Laterality: N/A;     Home Medications:  Prior to Admission medications   Medication Sig Start Date End Date Taking? Authorizing Provider  apixaban (ELIQUIS) 5 MG TABS tablet Take 1  tablet (5 mg total) by mouth 2 (two) times daily. 12/27/21   Clegg, [name], NP  atorvastatin (LIPITOR) 40 MG tablet TAKE 1 TABLET(40 MG) BY MOUTH DAILY 04/23/22   Laurey Morale, MD  carvedilol (COREG) 3.125 MG tablet Take 1 tablet (3.125 mg total) by mouth 2 (two) times daily. 09/06/22   Robbie Lis M, [name]  clopidogrel (PLAVIX) 75 MG tablet TAKE ONE TABLET BY MOUTH ONCE DAILY 07/10/22   Clegg, [name], NP  digoxin (LANOXIN) 0.125 MG tablet TAKE ONE TABLET BY MOUTH ONCE DAILY 08/17/22   Clegg, [name], NP  famotidine (PEPCID) 20 MG tablet TAKE ONE TABLET BY MOUTH ONCE DAILY 08/17/22   Clegg, [name], NP  Glycerin-Hypromellose-PEG 400 0.2-0.2-1 % SOLN Place 1 drop into both eyes as needed. 12/27/21   Clegg, [name], NP  isosorbide-hydrALAZINE (BIDIL) 20-37.5 MG tablet Take 0.5 tablets by mouth 3 (three) times daily. Take 1 & 1/2 tablets by mouth 3 times daily. 08/28/22   Milford, Anderson Malta, [name]  JARDIANCE 10 MG TABS tablet TAKE ONE TABLET BY MOUTH ONCE DAILY 08/17/22   Clegg, [name], NP  lamoTRIgine (LAMICTAL) 25 MG tablet Take 1 tablet (25 mg total) by mouth daily for 14 days. Then increase to 50 mg daily. 06/13/22 11/28/22  Lamar Sprinkles, MD  lamoTRIgine (LAMICTAL) 25 MG tablet Take 2 tablets (50 mg total) by mouth daily for 14 days. 06/28/22 11/28/22  Lamar Sprinkles, MD  potassium chloride SA (KLOR-CON M) 20 MEQ tablet Take 1 tablet (20 mEq total) by mouth daily. 07/10/22   Milford, Anderson Malta, [name]  sacubitril-valsartan (ENTRESTO) 24-26 MG Take 1 tablet by mouth 2 (two) times daily. 08/28/22   Jacklynn Ganong, [name]  spironolactone (ALDACTONE) 25 MG tablet Take 0.5 tablets (12.5 mg total) by mouth at bedtime. 09/06/22   Robbie Lis M, [name]  torsemide (DEMADEX) 20 MG tablet Take 1 tablet (20 mg total) by mouth daily. 07/10/22   Jacklynn Ganong, [name]  triamcinolone ointment (KENALOG) 0.5 % APPLY TOPICALLY TO THE AFFECTED AREA TWICE DAILY 04/02/22   Rema Fendt, NP    Inpatient Medications: Scheduled  Meds:  atorvastatin  40 mg Oral Daily   carvedilol  3.125 mg Oral BID   clopidogrel  75 mg Oral Daily   docusate sodium  100 mg Oral BID   folic acid  1 mg Oral Daily   furosemide  40 mg Intravenous BID   isosorbide-hydrALAZINE  0.5 tablet Oral TID   lidocaine  1 patch Transdermal Q24H   multivitamin with minerals  1 tablet Oral Daily   nicotine  21 mg Transdermal Daily   sodium chloride flush  3 mL Intravenous Q12H   thiamine  100 mg Oral Daily   Or   thiamine  100 mg Intravenous Daily   Continuous Infusions:  heparin     PRN Meds: acetaminophen **OR** acetaminophen, bisacodyl, dicyclomine, hydrALAZINE, hydrOXYzine, loperamide, LORazepam **OR** LORazepam, methocarbamol, morphine injection, ondansetron **OR** ondansetron (ZOFRAN) IV, polyethylene glycol  Allergies:    Allergies  Allergen Reactions   Shellfish Allergy Anaphylaxis  Peanut-Containing Drug Products Itching    Social History:   Social History   Socioeconomic History   Marital status: Significant Other    Spouse name: Not on file   Number of children: 7   Years of education: Not on file   Highest education level: 9th grade  Occupational History   Occupation: unemployed    Comment: experiencing homelessness-living at daily rent motels.  Tobacco Use   Smoking status: Every Day    Packs/day: 1.00    Years: 41.00    Additional pack years: 0.00    Total pack years: 41.00    Types: Cigarettes    Passive exposure: Current   Smokeless tobacco: Never  Vaping Use   Vaping Use: Never used  Substance and Sexual Activity   Alcohol use: Yes    Alcohol/week: 63.0 standard drinks of alcohol    Types: 63 Cans of beer per week    Comment: 3-40oz beer/day x10 years. 1-2 40oz beer/day in 20s/30s   Drug use: Yes    Types: "Crack" cocaine, Cocaine    Comment: last use 01/13/22   Sexual activity: Not on file  Other Topics Concern   Not on file  Social History Narrative   Not on file   Social Determinants of  Health   Financial Resource Strain: High Risk (09/07/2022)   Overall Financial Resource Strain (CARDIA)    Difficulty of Paying Living Expenses: Very hard  Food Insecurity: Food Insecurity Present (11/28/2022)   Hunger Vital Sign    Worried About Running Out of Food in the Last Year: Sometimes true    Ran Out of Food in the Last Year: Sometimes true  Transportation Needs: Unmet Transportation Needs (11/28/2022)   PRAPARE - Transportation    Lack of Transportation (Medical): Yes    Lack of Transportation (Non-Medical): Yes  Physical Activity: Not on file  Stress: Stress Concern Present (10/16/2022)   [employer] of Occupational Health - Occupational Stress Questionnaire    Feeling of Stress : To some extent  Social Connections: Not on file  Intimate Partner Violence: Not At Risk (11/28/2022)   Humiliation, Afraid, Rape, and Kick questionnaire    Fear of Current or Ex-Partner: No    Emotionally Abused: No    Physically Abused: No    Sexually Abused: No    Family History:    Family History  Problem Relation Age of Onset   Diabetes Mother      ROS:  Please see the history of present illness.   All other ROS reviewed and negative.     Physical Exam/Data:   Vitals:   11/28/22 1344 11/28/22 1545 11/28/22 1700 11/28/22 1702  BP:  113/76 (!) 134/95 (!) 132/99  Pulse:  (!) 104    Resp:  (!) 28 19 19   Temp: 99 F (37.2 C)  98.5 F (36.9 C) 98.5 F (36.9 C)  TempSrc:   Oral   SpO2:  91%    Weight:  68.5 kg    Height:  5\' 7"  (1.702 m)     No intake or output data in the 24 hours ending 11/28/22 1828    11/28/2022    3:45 PM 09/12/2022    3:05 PM 08/31/2022   10:07 AM  Last 3 Weights  Weight (lbs) 151 lb 151 lb 11.2 oz 146 lb 3.2 oz  Weight (kg) 68.493 kg 68.811 kg 66.316 kg     Body mass index is 23.65 kg/m.  General appearance: alert and no distress Neck: JVD -  several cm above sternal notch, no carotid bruit, and thyroid not enlarged, symmetric, no  tenderness/mass/nodules Lungs: diminished breath sounds bibasilar Heart: regular tachycardia Abdomen: soft, non-tender; bowel sounds normal; no masses,  no organomegaly Extremities: edema 1+ bilateral LE Pulses: 2+ and symmetric Skin: Skin color, texture, turgor normal. No rashes or lesions Neurologic: Grossly normal Psych: Pleasant  EKG:  The EKG was personally reviewed and demonstrates:  sinus tach 102bpm, biatrial enlargement, nonspecific STTW changes, QTC  Telemetry:  Telemetry was personally reviewed and demonstrates: sinus tachycardia  Relevant CV Studies: Echo 03/2022    1. Left ventricular ejection fraction, by estimation, is 30 to 35%. The  left ventricle has moderately decreased function. The left ventricle  demonstrates regional wall motion abnormalities, global hypokinesis with  apical akinesis. No thrombus  visualized. Left ventricular diastolic parameters are consistent with  Grade II diastolic dysfunction (pseudonormalization).   2. Right ventricular systolic function is mildly reduced. The right  ventricular size is normal. There is mildly elevated pulmonary artery  systolic pressure. The estimated right ventricular systolic pressure is  41.2 mmHg.   3. Left atrial size was moderately dilated.   4. MVA 1.69 cm^2 by VTI. Rheumatic-appearing thickened mitral valve. The  mitral valve is abnormal. Trivial mitral valve regurgitation. Moderate  mitral stenosis. The mean mitral valve gradient is 6.0 mmHg.   5. The aortic valve is tricuspid. Aortic valve regurgitation is not  visualized. No aortic stenosis is present.   6. The inferior vena cava is normal in size with greater than 50%  respiratory variability, suggesting right atrial pressure of 3 mmHg.    Otherwise see EMR for studies  Laboratory Data:  High Sensitivity Troponin:   Recent Labs  Lab 11/28/22 1348  TROPONINIHS 175*     Chemistry Recent Labs  Lab 11/28/22 1155  NA 134*  K 3.5  CL 106   CO2 18*  GLUCOSE 107*  BUN 17  CREATININE 1.69*  CALCIUM 7.6*  GFRNONAA 49*  ANIONGAP 10    Recent Labs  Lab 11/28/22 1155  PROT 5.0*  ALBUMIN 2.3*  AST 35  ALT 14  ALKPHOS 81  BILITOT 1.0   Lipids No results for input(s): "CHOL", "TRIG", "HDL", "LABVLDL", "LDLCALC", "CHOLHDL" in the last 168 hours.  Hematology Recent Labs  Lab 11/28/22 1155  WBC 10.6*  RBC 3.42*  HGB 10.7*  HCT 32.6*  MCV 95.3  MCH 31.3  MCHC 32.8  RDW 14.1  PLT 189   Thyroid No results for input(s): "TSH", "FREET4" in the last 168 hours.  BNP Recent Labs  Lab 11/28/22 1152  BNP 2,810.5*    DDimer No results for input(s): "DDIMER" in the last 168 hours.   Radiology/Studies:  DG Chest 2 View  Result Date: 11/28/2022 CLINICAL DATA:  Left chest pain with shortness of breath. Rhonchi in the upper and lower left lung. Wheezing on the right. EXAM: CHEST - 2 VIEW COMPARISON:  08/03/2022 FINDINGS: Cardiac enlargement. No vascular congestion. Consolidation demonstrated in the left lower lung likely representing pneumonia. Follow-up to resolution is recommended to exclude central obstructing process. Right lung is clear. No pleural effusions. No pneumothorax. Mediastinal contours appear intact. Old right rib fracture. IMPRESSION: 1. Consolidation in the left lower lung likely representing pneumonia. 2. Mild cardiac enlargement. No vascular congestion or edema demonstrated. Electronically Signed   By: Burman Nieves M.D.   On: 11/28/2022 12:31     Assessment and Plan:   Mr. Tassinari presents with acute on chronic decompensated systolic  congestive heart failure.  BNP is very high.  His troponin is higher than it has been previously, but I am not sure this is ACS.  He is describing left-sided chest pain which she describes in the chest wall and may be more consistent with findings on chest x-ray of pneumonia.  I suspect his troponin elevation is due to heart failure.  He has been noncompliant with his  medications having lost them.  He has been lost to follow-up with paramedics and and the heart failure clinic.  He recently found his medications but only a few days before he became so unwell that he was found by EMS and brought to the emergency department.  He will need reinitiation of his medications and aggressive diuresis  He has been started on heparin for elevated troponin.  A repeat echocardiogram has been ordered.  Thanks for the consultation.  Cardiology will follow with you.   Risk Assessment/Risk Scores:     :[id number]}    Elevated troponin may not be true ACS.  New York Heart Association (NYHA) Functional Class NYHA Class IV  For questions or updates, please contact [facility] Please consult [email or web address] for contact info under   Chrystie Nose, MD, Milagros Loll  [place]  [facility]  Medical Director of the Advanced Lipid Disorders &  Cardiovascular Risk Reduction Clinic Diplomate of the American Board of Clinical Lipidology Attending Cardiologist  Direct Dial: [phone]  Fax: [phone]  Website:  [email or web address]

## 2022-11-28 NOTE — Progress Notes (Signed)
Contacted Dr. Ophelia Charter to communicate patient request for "stronger pain medicine than Tylenol". MD requests initiation of CIWA and COWS.

## 2022-11-29 ENCOUNTER — Observation Stay (HOSPITAL_COMMUNITY): Payer: Medicaid Other

## 2022-11-29 ENCOUNTER — Telehealth (HOSPITAL_COMMUNITY): Payer: Self-pay | Admitting: Licensed Clinical Social Worker

## 2022-11-29 DIAGNOSIS — I2510 Atherosclerotic heart disease of native coronary artery without angina pectoris: Secondary | ICD-10-CM | POA: Diagnosis not present

## 2022-11-29 DIAGNOSIS — Z5900 Homelessness unspecified: Secondary | ICD-10-CM | POA: Diagnosis not present

## 2022-11-29 DIAGNOSIS — Z91013 Allergy to seafood: Secondary | ICD-10-CM | POA: Diagnosis not present

## 2022-11-29 DIAGNOSIS — I255 Ischemic cardiomyopathy: Secondary | ICD-10-CM | POA: Diagnosis not present

## 2022-11-29 DIAGNOSIS — I5043 Acute on chronic combined systolic (congestive) and diastolic (congestive) heart failure: Secondary | ICD-10-CM

## 2022-11-29 DIAGNOSIS — I130 Hypertensive heart and chronic kidney disease with heart failure and stage 1 through stage 4 chronic kidney disease, or unspecified chronic kidney disease: Secondary | ICD-10-CM | POA: Diagnosis not present

## 2022-11-29 DIAGNOSIS — I050 Rheumatic mitral stenosis: Secondary | ICD-10-CM | POA: Diagnosis not present

## 2022-11-29 DIAGNOSIS — F1410 Cocaine abuse, uncomplicated: Secondary | ICD-10-CM

## 2022-11-29 DIAGNOSIS — N179 Acute kidney failure, unspecified: Secondary | ICD-10-CM | POA: Diagnosis not present

## 2022-11-29 DIAGNOSIS — I69351 Hemiplegia and hemiparesis following cerebral infarction affecting right dominant side: Secondary | ICD-10-CM | POA: Diagnosis not present

## 2022-11-29 DIAGNOSIS — Z91148 Patient's other noncompliance with medication regimen for other reason: Secondary | ICD-10-CM

## 2022-11-29 DIAGNOSIS — G929 Unspecified toxic encephalopathy: Secondary | ICD-10-CM | POA: Diagnosis not present

## 2022-11-29 DIAGNOSIS — I110 Hypertensive heart disease with heart failure: Secondary | ICD-10-CM | POA: Diagnosis not present

## 2022-11-29 DIAGNOSIS — R079 Chest pain, unspecified: Secondary | ICD-10-CM | POA: Diagnosis not present

## 2022-11-29 DIAGNOSIS — R0989 Other specified symptoms and signs involving the circulatory and respiratory systems: Secondary | ICD-10-CM | POA: Diagnosis not present

## 2022-11-29 DIAGNOSIS — I2489 Other forms of acute ischemic heart disease: Secondary | ICD-10-CM | POA: Diagnosis not present

## 2022-11-29 DIAGNOSIS — Z91010 Allergy to peanuts: Secondary | ICD-10-CM | POA: Diagnosis not present

## 2022-11-29 DIAGNOSIS — F1010 Alcohol abuse, uncomplicated: Secondary | ICD-10-CM | POA: Diagnosis present

## 2022-11-29 DIAGNOSIS — S2231XA Fracture of one rib, right side, initial encounter for closed fracture: Secondary | ICD-10-CM | POA: Diagnosis not present

## 2022-11-29 DIAGNOSIS — I249 Acute ischemic heart disease, unspecified: Secondary | ICD-10-CM | POA: Diagnosis not present

## 2022-11-29 DIAGNOSIS — E785 Hyperlipidemia, unspecified: Secondary | ICD-10-CM | POA: Diagnosis not present

## 2022-11-29 DIAGNOSIS — R062 Wheezing: Secondary | ICD-10-CM | POA: Diagnosis not present

## 2022-11-29 DIAGNOSIS — F17210 Nicotine dependence, cigarettes, uncomplicated: Secondary | ICD-10-CM | POA: Diagnosis not present

## 2022-11-29 DIAGNOSIS — E871 Hypo-osmolality and hyponatremia: Secondary | ICD-10-CM | POA: Diagnosis not present

## 2022-11-29 DIAGNOSIS — I272 Pulmonary hypertension, unspecified: Secondary | ICD-10-CM | POA: Diagnosis not present

## 2022-11-29 DIAGNOSIS — G8929 Other chronic pain: Secondary | ICD-10-CM | POA: Diagnosis not present

## 2022-11-29 DIAGNOSIS — J690 Pneumonitis due to inhalation of food and vomit: Secondary | ICD-10-CM | POA: Diagnosis not present

## 2022-11-29 DIAGNOSIS — J4489 Other specified chronic obstructive pulmonary disease: Secondary | ICD-10-CM | POA: Diagnosis not present

## 2022-11-29 DIAGNOSIS — E440 Moderate protein-calorie malnutrition: Secondary | ICD-10-CM | POA: Diagnosis not present

## 2022-11-29 DIAGNOSIS — N1832 Chronic kidney disease, stage 3b: Secondary | ICD-10-CM | POA: Diagnosis not present

## 2022-11-29 LAB — ECHOCARDIOGRAM COMPLETE
Area-P 1/2: 5.27 cm2
Calc EF: 28.9 %
Height: 67 in
MV VTI: 1.85 cm2
S' Lateral: 4.7 cm
Single Plane A2C EF: 44.3 %
Single Plane A4C EF: 21.9 %
Weight: 2444.46 oz

## 2022-11-29 LAB — APTT
aPTT: 33 seconds (ref 24–36)
aPTT: 35 seconds (ref 24–36)

## 2022-11-29 LAB — AMMONIA: Ammonia: 32 umol/L (ref 9–35)

## 2022-11-29 LAB — RAPID URINE DRUG SCREEN, HOSP PERFORMED
Amphetamines: NOT DETECTED
Barbiturates: NOT DETECTED
Benzodiazepines: NOT DETECTED
Cocaine: POSITIVE — AB
Opiates: NOT DETECTED
Tetrahydrocannabinol: NOT DETECTED

## 2022-11-29 LAB — PROCALCITONIN: Procalcitonin: 0.52 ng/mL

## 2022-11-29 LAB — CBC
HCT: 37 % — ABNORMAL LOW (ref 39.0–52.0)
Hemoglobin: 12.3 g/dL — ABNORMAL LOW (ref 13.0–17.0)
MCH: 30.8 pg (ref 26.0–34.0)
MCHC: 33.2 g/dL (ref 30.0–36.0)
MCV: 92.7 fL (ref 80.0–100.0)
Platelets: 218 10*3/uL (ref 150–400)
RBC: 3.99 MIL/uL — ABNORMAL LOW (ref 4.22–5.81)
RDW: 13.5 % (ref 11.5–15.5)
WBC: 20.5 10*3/uL — ABNORMAL HIGH (ref 4.0–10.5)
nRBC: 0 % (ref 0.0–0.2)

## 2022-11-29 LAB — ETHANOL: Alcohol, Ethyl (B): <10 mg/dL (ref <10)

## 2022-11-29 LAB — CULTURE, BLOOD (ROUTINE X 2)

## 2022-11-29 LAB — HEPARIN LEVEL (UNFRACTIONATED): Heparin Unfractionated: 0.27 IU/mL — ABNORMAL LOW (ref 0.30–0.70)

## 2022-11-29 MED ORDER — PERFLUTREN LIPID MICROSPHERE
1.0000 mL | INTRAVENOUS | Status: AC | PRN
  Administered 2022-11-29: 2 mL via INTRAVENOUS

## 2022-11-29 MED ORDER — HEPARIN BOLUS VIA INFUSION
1000.0000 [IU] | Freq: Once | INTRAVENOUS | Status: AC
  Administered 2022-11-29: 1000 [IU] via INTRAVENOUS
  Filled 2022-11-29: qty 1000

## 2022-11-29 MED ORDER — ENSURE ENLIVE PO LIQD
237.0000 mL | Freq: Two times a day (BID) | ORAL | Status: DC
  Administered 2022-11-30 – 2022-12-05 (×7): 237 mL via ORAL

## 2022-11-29 MED ORDER — HEPARIN SODIUM (PORCINE) 5000 UNIT/ML IJ SOLN
5000.0000 [IU] | Freq: Three times a day (TID) | INTRAMUSCULAR | Status: DC
  Filled 2022-11-29: qty 1

## 2022-11-29 MED ORDER — SODIUM CHLORIDE 0.9 % IV SOLN
3.0000 g | Freq: Four times a day (QID) | INTRAVENOUS | Status: DC
  Administered 2022-11-29 – 2022-12-03 (×16): 3 g via INTRAVENOUS
  Filled 2022-11-29 (×18): qty 8

## 2022-11-29 MED ORDER — APIXABAN 5 MG PO TABS
5.0000 mg | ORAL_TABLET | Freq: Two times a day (BID) | ORAL | Status: DC
  Administered 2022-11-29 – 2022-12-05 (×12): 5 mg via ORAL
  Filled 2022-11-29 (×12): qty 1

## 2022-11-29 NOTE — [place] FL2 (Signed)
[place] MEDICAID FL2 LEVEL OF CARE FORM     IDENTIFICATION  Patient Name: Craig Schmidt Birthdate: 01/30/1971 Sex: male Admission Date (Current Location): 11/28/2022  [facility] and [place] Number:  Producer, television/film/video and Address:  [facility], [address] [place], [place] [address]      Provider Number: [id number]  Attending Physician Name and Address:  Zannie Cove, MD  Relative Name and Phone Number:       Current Level of Care: Hospital Recommended Level of Care: Skilled Nursing Facility Prior Approval Number:    Date Approved/Denied:   PASRR Number: [id number]  Discharge Plan: SNF    Current Diagnoses: Patient Active Problem List   Diagnosis Date Noted   Acute on chronic combined systolic (congestive) and diastolic (congestive) heart failure (HCC) 11/28/2022   ACS (acute coronary syndrome) (HCC) 11/28/2022   Back pain 11/28/2022   Essential hypertension 11/28/2022   Dyslipidemia 11/28/2022   Noncompliance with medication regimen 11/28/2022   ST elevation myocardial infarction (STEMI) of inferolateral wall, subsequent episode of care (HCC) 12/19/2021   ST elevation myocardial infarction (STEMI) (HCC)    Ischemic cardiomyopathy    Cocaine abuse (HCC)    Smoker    Alcohol abuse    Acute ischemic stroke (HCC) 09/08/2021   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   Tear of MCL (medial collateral ligament) of knee 05/20/2015   Protein-calorie malnutrition, severe 05/17/2015   Lactic acidosis 05/15/2015   Left knee pain 05/15/2015   Lung nodules 05/15/2015   Hypoglycemia 05/14/2015   Hypothermia 05/14/2015   Acute encephalopathy 05/14/2015   Cocaine abuse with intoxication (HCC) 05/14/2015   Alcohol intoxication in active alcoholic (HCC) 05/14/2015   Sepsis (HCC) 05/14/2015   Homeless 05/14/2015    Orientation RESPIRATION BLADDER Height & Weight     Self, Time, Situation, Place  O2 (4 liters) Incontinent,  External catheter Weight: 152 lb 12.5 oz (69.3 kg) Height:  5\' 7"  (170.2 cm)  BEHAVIORAL SYMPTOMS/MOOD NEUROLOGICAL BOWEL NUTRITION STATUS      Continent Diet (See dc summary)  AMBULATORY STATUS COMMUNICATION OF NEEDS Skin   Extensive Assist Verbally Normal                       Personal Care Assistance Level of Assistance  Bathing, Feeding, Dressing Bathing Assistance: Maximum assistance Feeding assistance: Independent Dressing Assistance: Maximum assistance     Functional Limitations Info  Sight, Hearing, Speech Sight Info: Impaired Hearing Info: Impaired Speech Info: Adequate    SPECIAL CARE FACTORS FREQUENCY  PT (By licensed PT), OT (By licensed OT)     PT Frequency: 5xweek OT Frequency: 5xweek            Contractures Contractures Info: Not present    Additional Factors Info  Code Status, Allergies Code Status Info: Full Allergies Info: Shellfish Allergy  Peanut-containing Drug Products           Current Medications (11/29/2022):  This is the current hospital active medication list Current Facility-Administered Medications  Medication Dose Route Frequency Provider Last Rate Last Admin   acetaminophen (TYLENOL) tablet 650 mg  650 mg Oral Q6H PRN Jonah Blue, MD   650 mg at 11/29/22 1027   Or   acetaminophen (TYLENOL) suppository 650 mg  650 mg Rectal Q6H PRN Jonah Blue, MD       atorvastatin (LIPITOR) tablet 40 mg  40 mg Oral Daily Jonah Blue, MD   40 mg at 11/29/22 0825  bisacodyl (DULCOLAX) EC tablet 5 mg  5 mg Oral Daily PRN Jonah Blue, MD       carvedilol (COREG) tablet 3.125 mg  3.125 mg Oral BID Jonah Blue, MD   3.125 mg at 11/29/22 0825   clopidogrel (PLAVIX) tablet 75 mg  75 mg Oral Daily Jonah Blue, MD   75 mg at 11/29/22 0825   dicyclomine (BENTYL) tablet 20 mg  20 mg Oral Q6H PRN Jonah Blue, MD       docusate sodium (COLACE) capsule 100 mg  100 mg Oral BID Jonah Blue, MD   100 mg at 11/29/22 0825   folic  acid (FOLVITE) tablet 1 mg  1 mg Oral Daily Jonah Blue, MD   1 mg at 11/29/22 1610   furosemide (LASIX) injection 40 mg  40 mg Intravenous BID Jonah Blue, MD   40 mg at 11/29/22 0829   heparin ADULT infusion 100 units/mL (25000 units/210mL)  900 Units/hr Intravenous Continuous Jonah Blue, MD 9 mL/hr at 11/29/22 0240 900 Units/hr at 11/29/22 0240   hydrALAZINE (APRESOLINE) injection 5 mg  5 mg Intravenous Q4H PRN Jonah Blue, MD       hydrOXYzine (ATARAX) tablet 25 mg  25 mg Oral Q6H PRN Jonah Blue, MD   25 mg at 11/29/22 9604   isosorbide-hydrALAZINE (BIDIL) 20-37.5 MG per tablet 0.5 tablet  0.5 tablet Oral TID Jonah Blue, MD   0.5 tablet at 11/29/22 0826   lidocaine (LIDODERM) 5 % 1 patch  1 patch Transdermal Q24H Jonah Blue, MD   1 patch at 11/28/22 1807   loperamide (IMODIUM) capsule 2-4 mg  2-4 mg Oral PRN Jonah Blue, MD       methocarbamol (ROBAXIN) tablet 500 mg  500 mg Oral Q8H PRN Jonah Blue, MD   500 mg at 11/29/22 5409   morphine (PF) 2 MG/ML injection 2 mg  2 mg Intravenous Q2H PRN Jonah Blue, MD       multivitamin with minerals tablet 1 tablet  1 tablet Oral Daily Jonah Blue, MD   1 tablet at 11/29/22 0825   nicotine (NICODERM CQ - dosed in mg/24 hours) patch 21 mg  21 mg Transdermal Daily Jonah Blue, MD   21 mg at 11/29/22 0830   ondansetron (ZOFRAN) tablet 4 mg  4 mg Oral Q6H PRN Jonah Blue, MD       Or   ondansetron [facility]) injection 4 mg  4 mg Intravenous Q6H PRN Jonah Blue, MD       perflutren lipid microspheres (DEFINITY) IV suspension  1-10 mL Intravenous PRN Zannie Cove, MD   2 mL at 11/29/22 0947   polyethylene glycol (MIRALAX / GLYCOLAX) packet 17 g  17 g Oral Daily PRN Jonah Blue, MD       sodium chloride flush (NS) 0.9 % injection 3 mL  3 mL Intravenous Q12H Jonah Blue, MD   3 mL at 11/29/22 8119   thiamine (VITAMIN B1) tablet 100 mg  100 mg Oral Daily Jonah Blue, MD   100 mg at 11/29/22  0825   Or   thiamine (VITAMIN B1) injection 100 mg  100 mg Intravenous Daily Jonah Blue, MD         Discharge Medications: Please see discharge summary for a list of discharge medications.  Relevant Imaging Results:  Relevant Lab Results:   Additional Information SSN:[id number]  Oletta Lamas, MSW, LCSWA, LCASA Transitions of Care  Clinical Social Worker I

## 2022-11-29 NOTE — Telephone Encounter (Signed)
H&V Care Navigation CSW Progress Note  Outpatient HF Clinical Social Worker met with pt to check in as he was lost to follow up with clinic due to inability to contact him.  Pt very groggy and unable to speak clearly with CSW- was able to get current accurate phone number for him and update in chart.  Will plan to come back tomorrow to do full assessment and assist as able.  Pt confirmed he is still homeless living in tent and has been out of meds for 4 months since paramedic stopped coming out for visits.    SDOH Screenings   Food Insecurity: Food Insecurity Present (11/28/2022)  Housing: Medium Risk (11/28/2022)  Transportation Needs: Unmet Transportation Needs (11/28/2022)  Utilities: At Risk (11/28/2022)  Alcohol Screen: High Risk (09/19/2021)  Depression (PHQ2-9): High Risk (06/13/2022)  Financial Resource Strain: High Risk (09/07/2022)  Stress: Stress Concern Present (10/16/2022)  Tobacco Use: High Risk (11/28/2022)    Burna Sis, LCSW Clinical Social Worker Advanced Heart Failure Clinic Desk#: [phone] Cell#: [phone]

## 2022-11-29 NOTE — Progress Notes (Addendum)
ANTICOAGULATION CONSULT NOTE - follow up  Pharmacy Consult for heparin Indication: chest pain/ACS and history of embolic CVA  Allergies  Allergen Reactions   Shellfish Allergy Anaphylaxis   Peanut-Containing Drug Products Itching    Patient Measurements: Height: 5\' 7"  (170.2 cm) Weight: 69.3 kg (152 lb 12.5 oz) IBW/kg (Calculated) : 66.1 Heparin Dosing Weight: 68.5 kg  Vital Signs: Temp: 100.4 F (38 C) (05/09 0732) Temp Source: Axillary (05/09 0732) BP: 120/87 (05/09 1148) Pulse Rate: 97 (05/09 1148)  Labs: Recent Labs    11/28/22 1155 11/28/22 1348 11/28/22 1743 11/28/22 2238 11/29/22 1032  HGB 10.7*  --   --   --  12.3*  HCT 32.6*  --   --   --  37.0*  PLT 189  --   --   --  218  APTT  --   --   --  34 33  HEPARINUNFRC  --   --   --  0.64 0.27*  CREATININE 1.69*  --   --   --   --   TROPONINIHS  --  175* 311* 267*  --      Estimated Creatinine Clearance: 48.9 mL/min (A) (by C-G formula based on SCr of 1.69 mg/dL (H)).   Medical History: Past Medical History:  Diagnosis Date   Alcohol abuse    Asthma    Brain tumor (HCC)    CAD (coronary artery disease)    Chronic HFrEF (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)    Chronic kidney disease, stage 3 (HCC)    Cocaine abuse (HCC)    History of medication noncompliance    Homelessness    MI (myocardial infarction) (HCC)    STEMI (ST elevation myocardial infarction) (HCC)    Stroke (HCC)     Medications:  Medications Prior to Admission  Medication Sig Dispense Refill Last Dose   apixaban (ELIQUIS) 5 MG TABS tablet Take 1 tablet (5 mg total) by mouth 2 (two) times daily. 60 tablet 5    atorvastatin (LIPITOR) 40 MG tablet TAKE 1 TABLET(40 MG) BY MOUTH DAILY 30 tablet 11    carvedilol (COREG) 3.125 MG tablet Take 1 tablet (3.125 mg total) by mouth 2 (two) times daily. 60 tablet 5    clopidogrel (PLAVIX) 75 MG tablet TAKE ONE TABLET BY MOUTH ONCE DAILY 30 tablet 6    digoxin (LANOXIN) 0.125 MG  tablet TAKE ONE TABLET BY MOUTH ONCE DAILY 30 tablet 6    famotidine (PEPCID) 20 MG tablet TAKE ONE TABLET BY MOUTH ONCE DAILY 30 tablet 6    Glycerin-Hypromellose-PEG 400 0.2-0.2-1 % SOLN Place 1 drop into both eyes as needed. 15 mL 0    isosorbide-hydrALAZINE (BIDIL) 20-37.5 MG tablet Take 0.5 tablets by mouth 3 (three) times daily. Take 1 & 1/2 tablets by mouth 3 times daily. 115 tablet 5    JARDIANCE 10 MG TABS tablet TAKE ONE TABLET BY MOUTH ONCE DAILY 30 tablet 6    lamoTRIgine (LAMICTAL) 25 MG tablet Take 1 tablet (25 mg total) by mouth daily for 14 days. Then increase to 50 mg daily. 14 tablet 0    lamoTRIgine (LAMICTAL) 25 MG tablet Take 2 tablets (50 mg total) by mouth daily for 14 days. 28 tablet 0    potassium chloride SA (KLOR-CON M) 20 MEQ tablet Take 1 tablet (20 mEq total) by mouth daily. 30 tablet 11    sacubitril-valsartan (ENTRESTO) 24-26 MG Take 1 tablet by mouth 2 (two) times daily. 60 tablet 3  spironolactone (ALDACTONE) 25 MG tablet Take 0.5 tablets (12.5 mg total) by mouth at bedtime. 45 tablet 3    torsemide (DEMADEX) 20 MG tablet Take 1 tablet (20 mg total) by mouth daily. 30 tablet 11    triamcinolone ointment (KENALOG) 0.5 % APPLY TOPICALLY TO THE AFFECTED AREA TWICE DAILY 60 g 2    Scheduled:   atorvastatin  40 mg Oral Daily   carvedilol  3.125 mg Oral BID   clopidogrel  75 mg Oral Daily   docusate sodium  100 mg Oral BID   folic acid  1 mg Oral Daily   furosemide  40 mg Intravenous BID   isosorbide-hydrALAZINE  0.5 tablet Oral TID   lidocaine  1 patch Transdermal Q24H   multivitamin with minerals  1 tablet Oral Daily   nicotine  21 mg Transdermal Daily   sodium chloride flush  3 mL Intravenous Q12H   thiamine  100 mg Oral Daily   Or   thiamine  100 mg Intravenous Daily   Infusions:   heparin 900 Units/hr (11/29/22 1140)    Assessment: Patient presented with acute chest pain and shortness of breath. Noted to have a hisotry of STEMI and CVA thought to  be embolic in nature; prescribed Eliquis. Patient was not taking Eliquis or other meds PTA, however reported that he found his medications and took them this morning for the first time in months.   Initial heparin level 0.64 and aPTT 34 not correlating. RN reports the infusion was paused for ~1 hour while lab tried to collect sample. Unfortunately the patient only has access on the one arm for infusion and lab draws. In addition to lab was collected ~4 hours from the initiation of the infusion without a bolus. No overt s/sx of bleeding reported heparin  continue current rate 900 unit/hr.    11/29/22 UPDATE:   next heparin level, aPTT was delayed this AM due to difficult IV stick, lab attempted x 3 before successful.  I instructed phlebotomist to attempt to draw PTT/HL labs below where heparin infusing and do not pause heparin drip.    aPTT = 33 sec,  subtherapeutic.  HL 0.27 down from previously 0.64, may be beginning to correlate to aPTT as pt was noncompliant with PTA apixaban.  Hgb 10.7>12.3, pltc wnl stable.  No bleeding reported.  RN confirms heparin drip was not paused by herself before blood drawn and no issues with heparin infusion..      Goal of Therapy:  Heparin level 0.3-0.7 units/ml aPTT 66-102 seconds Monitor platelets by anticoagulation protocol: Yes   Plan:   Give small heparin bolus 1000 units IV x1 now Increase heparin rate to 1050 units/hr Heparin level and aPTT in 6-8 hours Daily heparin level and aPTT until correlation is confirmed.  Daily CBC  Thank you for involving pharmacy in this patient's care.  Noah Delaine, RPh Clinical Pharmacist 11/29/2022 1:12 PM  Please check AMION for all [facility] numbers

## 2022-11-29 NOTE — Discharge Instructions (Signed)
Intensive Outpatient Programs   [facility]                                  [facility] [address]                                                       [address] [place],  [place]                                                           [place],  [phone]                                                              [phone]   [facility]                [facility]         (Inpatient and outpatient)                                           [phone] (Suboxone and Methadone) [address]                                                                                                                 [phone]                                                                                                     [facility]                                               [facility] - Intensive Outpatient [address]                                                            [address] [place], [place] [address]                                                 [place], [place]  [phone]                                                              [phone]   [facility] (Outpatient, Inpatient, Chemical       [facility] (Groups and [facility]idental) (insurance only) [phone]                                              [place], [place]                                                                                                             [phone]                                                    [facility]                                       [facility] (for caregivers and family) [address]                                                    [address] [place], [place]                                                          [place], [place] [phone]                                                               [phone]   Residential Treatment Programs   [facility]            [facility](2 years) Residential: 90 days)                 [facility] [address]                                                            [address] [place], [place]                                                    [place], [place] [phone]                                                              [phone] or [phone]   [facility]                                              [facility] [address]                                                               [address] [place], [place]                                                          [place], [place] [phone]                                                              [phone]   [facility]                                [facility] ([facility] [address]                                                           [address] [place], [place] [address]                                                   [place], [place] [phone]                                                              [phone] Admissions: 8am-3pm M-F   [facility]: Residential Program (90 Days)                     [facility]: [facility]  [place], [place]                                                    [place], [place]  [phone] or [phone]                                             (Walk in Hours over the weekend or by referral)     Mobil Crisis: [facility]:[phone] (for crisis response 24 hours a day)     Last Modified by Smith, Jessica, LCSWA at 04/08/21 1447    
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
## 2022-11-29 NOTE — Progress Notes (Addendum)
PROGRESS NOTE    Craig Schmidt  ZOX:[id number] DOB: May 05, 1971 DOA: 11/28/2022 PCP: Rema Fendt, NP  52/M w/ tobacco abuse, COPD/asthma, CAD, CVA, chronic combined CHF (ER 30-35% and grade 2 DD in 03/2022), and history of vestibular schwannoma presenting with CP/SOB.  He reports not having access to medications for at least the last 4 months.  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, supposed to be on Eliquis -Currently on IV heparin  History of vestibular schwannoma -Unclear when this was diagnosed or whether he has had any workup for it  Polysubstance abuse Cocaine, alcohol abuse -Check UDS -Social work consult  Noncompliance Homelessness   DVT prophylaxis: IV heparin Code Status: Full code Family Communication: Roberts Gaudy at bedside Disposition Plan: SNF  Consultants:    Procedures:   Antimicrobials:    Objective: Vitals:   11/29/22 0007 11/29/22 0341 11/29/22 0441 11/29/22 0732  BP: 115/75  (!) 123/91  (!) 139/108  Pulse: 94 94 97 100  Resp: 19 (!) 21  19  Temp: 97.6 F (36.4 C) 98.3 F (36.8 C)  (!) 100.4 F (38 C)  TempSrc: Oral Oral  Axillary  SpO2: 90% 96%  97%  Weight:  69.3 kg    Height:        Intake/Output Summary (Last 24 hours) at 11/29/2022 1020 Last data filed at 11/29/2022 1000 Gross per 24 hour  Intake 823.18 ml  Output 3100 ml  Net -2276.82 ml   Filed Weights   11/28/22 1545 11/29/22 0341  Weight: 68.5 kg 69.3 kg    Examination:  General exam: Chronically ill male sitting up in bed, somnolent but arousable HEENT: Positive JVD CVS: S1-S2, regular rhythm, tachycardic Lungs: Decreased breath sounds at the bases, few basilar rales Abdomen: Soft, nontender, bowel sounds present Extremities: 1-2+ edema   Data Reviewed:   CBC: Recent Labs  Lab 11/28/22 1155  WBC 10.6*  NEUTROABS 8.4*  HGB 10.7*  HCT 32.6*  MCV 95.3  PLT 189   Basic Metabolic Panel: Recent Labs  Lab 11/28/22 1155  NA 134*  K 3.5  CL 106  CO2 18*  GLUCOSE 107*  BUN 17  CREATININE 1.69*  CALCIUM 7.6*   GFR: Estimated Creatinine Clearance: 48.9 mL/min (A) (by C-G formula based on SCr of 1.69 mg/dL (H)). Liver Function Tests: Recent Labs  Lab 11/28/22 1155  AST 35  ALT 14  ALKPHOS 81  BILITOT 1.0  PROT 5.0*  ALBUMIN 2.3*   No results for input(s): "LIPASE", "AMYLASE" in the last 168 hours. No results for input(s): "AMMONIA" in the last 168 hours.  Coagulation Profile: No results for input(s): "INR", "PROTIME" in the last 168 hours. Cardiac Enzymes: No results for input(s): "CKTOTAL", "CKMB", "CKMBINDEX", "TROPONINI" in the last 168 hours. BNP (last 3 results) No results for input(s): "PROBNP" in the last 8760 hours. HbA1C: Recent Labs    11/28/22 1743  HGBA1C 5.4   CBG: No results for input(s): "GLUCAP" in the last 168 hours. Lipid Profile: Recent Labs    11/28/22 1743  CHOL 118  HDL 27*  LDLCALC 79  TRIG 60  CHOLHDL 4.4   Thyroid Function  Tests: Recent Labs    11/28/22 1743  TSH 1.196   Anemia Panel: No results for input(s): "VITAMINB12", "FOLATE", "FERRITIN", "TIBC", "IRON", "RETICCTPCT" in the last 72 hours. Urine analysis:    Component Value Date/Time   COLORURINE YELLOW 07/17/2022 1622   APPEARANCEUR HAZY (A) 07/17/2022 1622   LABSPEC 1.023 07/17/2022 1622   PHURINE 5.0 07/17/2022 1622   GLUCOSEU >=500 (A) 07/17/2022 1622   HGBUR NEGATIVE 07/17/2022 1622   BILIRUBINUR NEGATIVE 07/17/2022 1622   KETONESUR NEGATIVE 07/17/2022 1622   PROTEINUR 100 (A) 07/17/2022 1622   UROBILINOGEN 0.2 05/14/2015 0845   NITRITE NEGATIVE 07/17/2022 1622   LEUKOCYTESUR NEGATIVE 07/17/2022 1622   Sepsis Labs: @LABRCNTIP (procalcitonin:4,lacticidven:4)  )No results found for this or any previous visit (from the past 240 hour(s)).   Radiology Studies: DG Chest 2 View  Result Date: 11/28/2022 CLINICAL DATA:  Left chest pain with shortness of breath. Rhonchi in the upper and lower left lung. Wheezing on the right. EXAM: CHEST - 2 VIEW COMPARISON:  08/03/2022 FINDINGS: Cardiac enlargement. No vascular congestion. Consolidation demonstrated in the left lower lung likely representing pneumonia. Follow-up to resolution is recommended to exclude central obstructing process. Right lung is clear. No pleural effusions. No pneumothorax. Mediastinal contours appear intact. Old right rib fracture. IMPRESSION: 1. Consolidation in the left lower lung likely representing pneumonia. 2. Mild cardiac enlargement. No vascular congestion or edema demonstrated. Electronically Signed   By: Burman Nieves M.D.   On: 11/28/2022 12:31     Scheduled Meds:  atorvastatin  40 mg Oral Daily   carvedilol  3.125 mg Oral BID   clopidogrel  75 mg Oral Daily   docusate sodium  100 mg Oral BID   folic acid  1 mg Oral Daily   furosemide  40 mg Intravenous BID   isosorbide-hydrALAZINE  0.5 tablet Oral TID   lidocaine  1 patch Transdermal Q24H   multivitamin with  minerals  1 tablet Oral Daily   nicotine  21 mg Transdermal Daily   sodium chloride flush  3 mL Intravenous Q12H   thiamine  100 mg Oral Daily   Or   thiamine  100 mg Intravenous Daily   Continuous Infusions:  heparin 900 Units/hr (11/29/22 0240)     LOS: 0 days    Time spent:    Zannie Cove, MD [facility]   11/29/2022, 10:20 AM

## 2022-11-29 NOTE — Progress Notes (Signed)
Pt's watch on left wrist interfering with PIV. Heparin infusing in PIV. Asked permission from pt to remove watch and place in black messenger bag in recliner. Informed oncoming RN of location of watch as well.

## 2022-11-29 NOTE — Evaluation (Signed)
Occupational Therapy Evaluation Patient Details Name: Craig Schmidt MRN: [id number] DOB: 1971/04/12 Today's Date: 11/29/2022   History of Present Illness Pt is a 52 year old male admitted on 11/28/22 for chest pain and SOB. Past medical history significant of asthma, CAD, CVA, chronic combined CHF (ER 30-35% and grade 2 DD in 03/2022), and brain tumor   Clinical Impression   Pt admitted for above dx, PTA patient was homeless and reports ambulating with RW and independently completing bADLs but RUE is limited in functional use. Pt currently needing +2 Min A to stand and Mod A +2 for steps, pt remains limited by decreased balance and generalized pain/weakness. Pt also noted to have more swelling in RLE as compared to LLE. Pt would benefit from continued acute skilled OT to address above deficits and help transition to next level of care. Patient would benefit from post acute skilled rehab facility with <3 hours of therapy and 24/7 support       Recommendations for follow up therapy are one component of a multi-disciplinary discharge planning process, led by the attending physician.  Recommendations may be updated based on patient status, additional functional criteria and insurance authorization.   Assistance Recommended at Discharge Frequent or constant Supervision/Assistance  Patient can return home with the following A lot of help with walking and/or transfers;A lot of help with bathing/dressing/bathroom;Assistance with cooking/housework;Direct supervision/assist for financial management;Direct supervision/assist for medications management;Assist for transportation;Help with stairs or ramp for entrance    Functional Status Assessment  Patient has had a recent decline in their functional status and demonstrates the ability to make significant improvements in function in a reasonable and predictable amount of time.  Equipment Recommendations  Other (comment) (defer to next level of care)     Recommendations for Other Services       Precautions / Restrictions Precautions Precautions: Fall Restrictions Weight Bearing Restrictions: No      Mobility Bed Mobility Overal bed mobility: Needs Assistance Bed Mobility: Supine to Sit, Sit to Supine     Supine to sit: +2 for physical assistance, Min assist Sit to supine: +2 for physical assistance, Max assist   General bed mobility comments: pt unable to clear bed with BLEs, needing assist with trunk and LE control to return to supine    Transfers Overall transfer level: Needs assistance Equipment used: 2 person hand held assist Transfers: Sit to/from Stand Sit to Stand: Min assist, +2 physical assistance           General transfer comment: verbal cues for upright posture      Balance Overall balance assessment: Needs assistance Sitting-balance support: Feet supported Sitting balance-Leahy Scale: Fair Sitting balance - Comments: Pt with a heavy anterior lean Postural control: Other (comment) (anterior lean, pt with elbows on thighs) Standing balance support: Bilateral upper extremity supported, During functional activity Standing balance-Leahy Scale: Poor Standing balance comment: Reliant on +2 HHA                           ADL either performed or assessed with clinical judgement   ADL Overall ADL's : Needs assistance/impaired Eating/Feeding: Sitting;Minimal assistance Eating/Feeding Details (indicate cue type and reason): Pt limited by pain with AROM at this time Grooming: Sitting;Wash/dry hands;Set up;Supervision/safety Grooming Details (indicate cue type and reason): using LUE and neck/trunk flexed Upper Body Bathing: Sitting;Moderate assistance   Lower Body Bathing: Sitting/lateral leans;Maximal assistance   Upper Body Dressing : Sitting;Minimal assistance   Lower Body  Dressing: Sitting/lateral leans;Total assistance Lower Body Dressing Details (indicate cue type and reason): to don bilat  socsk sitting EOB Toilet Transfer: +2 for physical assistance;Moderate assistance   Toileting- Clothing Manipulation and Hygiene: Moderate assistance   Tub/ Shower Transfer: Moderate assistance;+2 for physical assistance   Functional mobility during ADLs: Moderate assistance;+2 for physical assistance (steps at bedside) General ADL Comments: pt limited in OOB by pain     Vision         Perception     Praxis      Pertinent Vitals/Pain Pain Assessment Pain Assessment: 0-10 Pain Score: 10-Worst pain ever Pain Location: Generalized all over body Pain Descriptors / Indicators: Constant, Discomfort, Grimacing, Guarding, Moaning Pain Intervention(s): Limited activity within patient's tolerance, Monitored during session, RN gave pain meds during session     Hand Dominance Left   Extremity/Trunk Assessment Upper Extremity Assessment Upper Extremity Assessment: Generalized weakness;RUE deficits/detail;LUE deficits/detail RUE Deficits / Details: RUE PROM able to achieve 70 degrees shoulder flexion with some mild spasticity of elb flexors. AROM limited by pain, pt not able to touch face with RUE RUE: Unable to fully assess due to pain LUE Deficits / Details: LUE able to touch face with strong neck/trunk flexion LUE: Unable to fully assess due to pain   Lower Extremity Assessment Lower Extremity Assessment: Generalized weakness;RLE deficits/detail RLE Deficits / Details: RLE swelling, more than noted in LLE   Cervical / Trunk Assessment Cervical / Trunk Assessment: Kyphotic   Communication Communication Communication: Other (comment) (pt with occassional mumbled speech, improved once positioned EOB)   Cognition Arousal/Alertness: Lethargic Behavior During Therapy: Restless, Flat affect Overall Cognitive Status: No family/caregiver present to determine baseline cognitive functioning                                 General Comments: Pt following one step commands  with increased time     General Comments  VSS on 4L, Sp02 95% and HR between 102-107    Exercises     Shoulder Instructions      Home Living Family/patient expects to be discharged to:: Shelter/Homeless                                 Additional Comments: Pt states that he [place].      Prior Functioning/Environment Prior Level of Function : Independent/Modified Independent;History of Falls (last six months);Patient poor historian/Family not available (Pt reports 3 falls recently due to tripping)             Mobility Comments: Pt states that he has a RW and ambulated short distances. Pt states his WC was destroyed by weather. ADLs Comments: R UE is not functional.  He has limited grip and little to no AROM to elbow and shoulder.  He is able to complete ADL, but is not as thorough as he needs to be. (From previous encounter. Appears to be no change)        OT Problem List: Decreased strength;Decreased activity tolerance;Impaired balance (sitting and/or standing);Impaired UE functional use;Pain;Decreased safety awareness      OT Treatment/Interventions: Self-care/ADL training;Energy conservation;Balance training;Therapeutic exercise;Therapeutic activities;Patient/family education;DME and/or AE instruction    OT Goals(Current goals can be found in the care plan section) Acute Rehab OT Goals Patient Stated Goal: none stated OT Goal Formulation: With patient Time For Goal Achievement: 12/13/22 Potential  to Achieve Goals: Good ADL Goals Pt Will Perform Grooming: standing;with set-up;with supervision (using compensatory strategies prn) Pt Will Perform Upper Body Dressing: sitting;with set-up;with supervision (using hemi dressing techniques prn) Pt Will Perform Lower Body Dressing: with min assist;sitting/lateral leans Pt Will Transfer to Toilet: with min guard assist;ambulating Pt/caregiver will Perform Home Exercise Program: Increased  ROM;Both right and left upper extremity;With written HEP provided;With Supervision  OT Frequency: Min 2X/week    Co-evaluation PT/OT/SLP Co-Evaluation/Treatment: Yes Reason for Co-Treatment: Complexity of the patient's impairments (multi-system involvement);For patient/therapist safety PT goals addressed during session: Mobility/safety with mobility OT goals addressed during session: ADL's and self-care      AM-PAC OT "6 Clicks" Daily Activity     Outcome Measure Help from another person eating meals?: A Little Help from another person taking care of personal grooming?: A Little Help from another person toileting, which includes using toliet, bedpan, or urinal?: A Lot Help from another person bathing (including washing, rinsing, drying)?: A Lot Help from another person to put on and taking off regular upper body clothing?: A Little Help from another person to put on and taking off regular lower body clothing?: Total 6 Click Score: 14   End of Session Nurse Communication: Mobility status  Activity Tolerance: Patient limited by pain Patient left: in bed;with call bell/phone within reach;with bed alarm set  OT Visit Diagnosis: Unsteadiness on feet (R26.81);Other abnormalities of gait and mobility (R26.89);History of falling (Z91.81);Pain Pain - Right/Left:  (generalized) Pain - part of body:  (all over)                Time: 0981-1914 OT Time Calculation (min): 28 min Charges:  OT General Charges $OT Visit: 1 Visit OT Evaluation $OT Eval Moderate Complexity: 1 Mod  11/29/2022  AB, OTR/L  Acute Rehabilitation Services  Office: [phone]   Craig Schmidt 11/29/2022, 10:00 AM

## 2022-11-29 NOTE — Progress Notes (Signed)
DAILY PROGRESS NOTE   Patient Name: Craig Schmidt Date of Encounter: 11/29/2022 Cardiologist: [name], MD  Chief Complaint   Breathing better  Patient Profile   Craig Schmidt is a 52 y.o. male with a hx of chronic systolic CHF dx 08/2021 when he presented with stroke requiring TNK, moderate mitral valve stenosis, inferolateral STEMI 11/2021 with 100% apical LAD suspected embolic versus plaque rupture with thrombosis, homelessness, cocaine abuse, ETOH abuse, THC use,  left vestibular schwannoma, CKD 3a, medication noncompliance who is being seen 11/28/2022 for the evaluation of CHF at the request of Dr. Ophelia Charter   Subjective   Diuresed about 1.5L negative overnight. Vitals stable. Tmax 100.4. No labs this morning. Repeat echo is pending. Somnolent today when examining him. Some family/friends in with him.  Objective   Vitals:   11/29/22 0007 11/29/22 0341 11/29/22 0441 11/29/22 0732  BP: 115/75 (!) 123/91  (!) 139/108  Pulse: 94 94 97 100  Resp: 19 (!) 21  19  Temp: 97.6 F (36.4 C) 98.3 F (36.8 C)  (!) 100.4 F (38 C)  TempSrc: Oral Oral  Axillary  SpO2: 90% 96%  97%  Weight:  69.3 kg    Height:        Intake/Output Summary (Last 24 hours) at 11/29/2022 0958 Last data filed at 11/29/2022 0556 Gross per 24 hour  Intake 772.12 ml  Output 2250 ml  Net -1477.88 ml   Filed Weights   11/28/22 1545 11/29/22 0341  Weight: 68.5 kg 69.3 kg    Physical Exam   General appearance: somnolent, no distress Neck: no carotid bruit, no JVD, and thyroid not enlarged, symmetric, no tenderness/mass/nodules Lungs: diminished breath sounds bibasilar Heart: regular tachycardia Abdomen: scaphoid, soft, non-tender Extremities: edema 1-2+ bilateral LE Pulses: 2+ and symmetric Skin: Skin color, texture, turgor normal. No rashes or lesions Neurologic: Mental status: somnolent Psych: Cannot assess  Inpatient Medications    Scheduled Meds:  atorvastatin  40 mg Oral Daily    carvedilol  3.125 mg Oral BID   clopidogrel  75 mg Oral Daily   docusate sodium  100 mg Oral BID   folic acid  1 mg Oral Daily   furosemide  40 mg Intravenous BID   isosorbide-hydrALAZINE  0.5 tablet Oral TID   lidocaine  1 patch Transdermal Q24H   multivitamin with minerals  1 tablet Oral Daily   nicotine  21 mg Transdermal Daily   sodium chloride flush  3 mL Intravenous Q12H   thiamine  100 mg Oral Daily   Or   thiamine  100 mg Intravenous Daily    Continuous Infusions:  heparin 900 Units/hr (11/29/22 0240)    PRN Meds: acetaminophen **OR** acetaminophen, bisacodyl, dicyclomine, hydrALAZINE, hydrOXYzine, loperamide, methocarbamol, morphine injection, ondansetron **OR** ondansetron (ZOFRAN) IV, perflutren lipid microspheres (DEFINITY) IV suspension, polyethylene glycol   Labs   Results for orders placed or performed during the hospital encounter of 11/28/22 (from the past 48 hour(s))  Brain natriuretic peptide     Status: Abnormal   Collection Time: 11/28/22 11:52 AM  Result Value Ref Range   B Natriuretic Peptide 2,810.5 (H) 0.0 - 100.0 pg/mL    Comment: Performed at [facility], [address] [place], [place] [address]  Comprehensive metabolic panel     Status: Abnormal   Collection Time: 11/28/22 11:55 AM  Result Value Ref Range   Sodium 134 (L) 135 - 145 mmol/L   Potassium 3.5 3.5 - 5.1 mmol/L  Chloride 106 98 - 111 mmol/L   CO2 18 (L) 22 - 32 mmol/L   Glucose, Bld 107 (H) 70 - 99 mg/dL    Comment: Glucose reference range applies only to samples taken after fasting for at least 8 hours.   BUN 17 6 - 20 mg/dL   Creatinine, Ser 1.61 (H) 0.61 - 1.24 mg/dL   Calcium 7.6 (L) 8.9 - 10.3 mg/dL   Total Protein 5.0 (L) 6.5 - 8.1 g/dL   Albumin 2.3 (L) 3.5 - 5.0 g/dL   AST 35 15 - 41 U/L   ALT 14 0 - 44 U/L   Alkaline Phosphatase 81 38 - 126 U/L   Total Bilirubin 1.0 0.3 - 1.2 mg/dL   GFR, Estimated 49 (L) >60 mL/min    Comment: (NOTE) Calculated using the  CKD-EPI Creatinine Equation (2021)    Anion gap 10 5 - 15    Comment: Performed at [facility], [address], [place], [place] [address]  CBC with Differential     Status: Abnormal   Collection Time: 11/28/22 11:55 AM  Result Value Ref Range   WBC 10.6 (H) 4.0 - 10.5 K/uL   RBC 3.42 (L) 4.22 - 5.81 MIL/uL   Hemoglobin 10.7 (L) 13.0 - 17.0 g/dL   HCT 54.0 (L) 98.1 - 19.1 %   MCV 95.3 80.0 - 100.0 fL   MCH 31.3 26.0 - 34.0 pg   MCHC 32.8 30.0 - 36.0 g/dL   RDW 47.8 29.5 - 62.1 %   Platelets 189 150 - 400 K/uL   nRBC 0.0 0.0 - 0.2 %   Neutrophils Relative % 78 %   Neutro Abs 8.4 (H) 1.7 - 7.7 K/uL   Lymphocytes Relative 12 %   Lymphs Abs 1.3 0.7 - 4.0 K/uL   Monocytes Relative 8 %   Monocytes Absolute 0.8 0.1 - 1.0 K/uL   Eosinophils Relative 1 %   Eosinophils Absolute 0.1 0.0 - 0.5 K/uL   Basophils Relative 0 %   Basophils Absolute 0.0 0.0 - 0.1 K/uL   Immature Granulocytes 1 %   Abs Immature Granulocytes 0.06 0.00 - 0.07 K/uL    Comment: Performed at [facility], [address], [place], [place] [address]  Troponin I (High Sensitivity)     Status: Abnormal   Collection Time: 11/28/22  1:48 PM  Result Value Ref Range   Troponin I (High Sensitivity) 175 (HH) <18 ng/L    Comment: CRITICAL RESULT CALLED TO, READ BACK BY AND VERIFIED WITH P.BLANCHARD RN 1452 11/28/22 MCCORMICK K (NOTE) Elevated high sensitivity troponin I (hsTnI) values and significant  changes across serial measurements may suggest ACS but many other  chronic and acute conditions are known to elevate hsTnI results.  Refer to the "Links" section for chest pain algorithms and additional  guidance. Performed at [facility], [address] [place], [place] [address]   HIV Antibody (routine testing w rflx)     Status: None   Collection Time: 11/28/22  4:46 PM  Result Value Ref Range   HIV Screen 4th Generation wRfx Non Reactive Non Reactive    Comment: Performed at [facility], [address], [place], [place] [address]  Troponin I (High Sensitivity)     Status: Abnormal   Collection Time: 11/28/22  5:43 PM  Result Value Ref Range   Troponin I (High Sensitivity) 311 (HH) <18 ng/L    Comment: CRITICAL VALUE NOTED. VALUE IS CONSISTENT  WITH PREVIOUSLY REPORTED/CALLED VALUE (NOTE) Elevated high sensitivity troponin I (hsTnI) values and significant  changes across serial measurements may suggest ACS but many other  chronic and acute conditions are known to elevate hsTnI results.  Refer to the "Links" section for chest pain algorithms and additional  guidance. Performed at [facility], [address] [place], [place] [address]   Hemoglobin A1c     Status: None   Collection Time: 11/28/22  5:43 PM  Result Value Ref Range   Hgb A1c MFr Bld 5.4 4.8 - 5.6 %    Comment: (NOTE) Pre diabetes:          5.7%-6.4%  Diabetes:              >6.4%  Glycemic control for   <7.0% adults with diabetes    Mean Plasma Glucose 108.28 mg/dL    Comment: Performed at [facility], [address], [place], [place] [address]  Lipid panel     Status: Abnormal   Collection Time: 11/28/22  5:43 PM  Result Value Ref Range   Cholesterol 118 0 - 200 mg/dL   Triglycerides 60 <295 mg/dL   HDL 27 (L) >28 mg/dL   Total CHOL/HDL Ratio 4.4 RATIO   VLDL 12 0 - 40 mg/dL   LDL Cholesterol 79 0 - 99 mg/dL    Comment:        Total Cholesterol/HDL:CHD Risk Coronary Heart Disease Risk Table                     Men   Women  1/2 Average Risk   3.4   3.3  Average Risk       5.0   4.4  2 X Average Risk   9.6   7.1  3 X Average Risk  23.4   11.0        Use the calculated Patient Ratio above and the CHD Risk Table to determine the patient's CHD Risk.        ATP III CLASSIFICATION (LDL):  <100     mg/dL   Optimal  413-244  mg/dL   Near or Above                    Optimal  130-159  mg/dL   Borderline  010-272  mg/dL   High  >536     mg/dL   Very High Performed at [facility], [address], [place], [place] [address]   TSH     Status: None   Collection Time: 11/28/22  5:43 PM  Result Value Ref Range   TSH 1.196 0.350 - 4.500 uIU/mL    Comment: Performed by a 3rd Generation assay with a functional sensitivity of <=0.01 uIU/mL. Performed at [facility], [address], [place], [place] [address]   Heparin level (unfractionated)     Status: None   Collection Time: 11/28/22 10:38 PM  Result Value Ref Range   Heparin Unfractionated 0.64 0.30 - 0.70 IU/mL    Comment: (NOTE) The clinical reportable range upper limit is being lowered to >1.10 to align with the FDA approved guidance for the current laboratory assay.  If heparin results are below expected values, and patient dosage has  been confirmed, suggest follow up testing of antithrombin III levels. Performed at [facility], [address], [place], [place] [address]   APTT     Status: None   Collection  Time: 11/28/22 10:38 PM  Result Value Ref Range   aPTT 34 24 - 36 seconds    Comment: Performed at [facility], [address], [place], [place] [address]  Troponin I (High Sensitivity)     Status: Abnormal   Collection Time: 11/28/22 10:38 PM  Result Value Ref Range   Troponin I (High Sensitivity) 267 (HH) <18 ng/L    Comment: CRITICAL VALUE NOTED. VALUE IS CONSISTENT WITH PREVIOUSLY REPORTED/CALLED VALUE (NOTE) Elevated high sensitivity troponin I (hsTnI) values and significant  changes across serial measurements may suggest ACS but many other  chronic and acute conditions are known to elevate hsTnI results.  Refer to the "Links" section for chest pain algorithms and additional  guidance. Performed at [facility], [address], [place], [place] [address]     ECG   N/A  Telemetry   Sinus tachycardia - Personally Reviewed  Radiology    DG Chest 2 View  Result Date: 11/28/2022 CLINICAL DATA:  Left chest pain with shortness of breath. Rhonchi in the  upper and lower left lung. Wheezing on the right. EXAM: CHEST - 2 VIEW COMPARISON:  08/03/2022 FINDINGS: Cardiac enlargement. No vascular congestion. Consolidation demonstrated in the left lower lung likely representing pneumonia. Follow-up to resolution is recommended to exclude central obstructing process. Right lung is clear. No pleural effusions. No pneumothorax. Mediastinal contours appear intact. Old right rib fracture. IMPRESSION: 1. Consolidation in the left lower lung likely representing pneumonia. 2. Mild cardiac enlargement. No vascular congestion or edema demonstrated. Electronically Signed   By: Burman Nieves M.D.   On: 11/28/2022 12:31    Cardiac Studies   Echo pending  Assessment   Principal Problem:   Acute on chronic combined systolic (congestive) and diastolic (congestive) heart failure (HCC) Active Problems:   Homeless   Cocaine abuse (HCC)   Smoker   Alcohol abuse   ACS (acute coronary syndrome) (HCC)   Back pain   Essential hypertension   Dyslipidemia   Noncompliance with medication regimen   Plan   Continue IV diuresis. Troponins mildly elevated (175, 311, and 267), more consistent with demand ischemia. Can stop probably stop heparin and switch to DVT prophylaxis per pharmacy. Echo read pending, however, personally reviewed - LVEF is probably 20-25%, global hypokinesis. No apical thrombus with contrast. On low dose coreg and Bidil, atorvastatin (LDL 79).   Time Spent Directly with Patient:  I have spent a total of 25 minutes with the patient reviewing hospital notes, telemetry, EKGs, labs and examining the patient as well as establishing an assessment and plan that was discussed personally with the patient.  > 50% of time was spent in direct patient care.  Length of Stay:  LOS: 0 days   Chrystie Nose, MD, [facility], FACP  [place]  [facility]  Medical Director of the Advanced Lipid Disorders &  Cardiovascular Risk Reduction Clinic Diplomate of the  American Board of Clinical Lipidology Attending Cardiologist  Direct Dial: [phone]  Fax: [phone]  Website:  [email or web address] 11/29/2022, 9:58 AM

## 2022-11-29 NOTE — Progress Notes (Signed)
Heart Failure Navigator Progress Note  Assessed for Heart & Vascular TOC clinic readiness.  Patient does not meet criteria due to Advanced Heart Failure Team patient.   Navigator will sign off at this time.    Gayland Nicol, BSN, RN Heart Failure Nurse Navigator Secure Chat Only   

## 2022-11-29 NOTE — Progress Notes (Signed)
Initial Nutrition Assessment  DOCUMENTATION CODES:   Non-severe (moderate) malnutrition in context of chronic illness  INTERVENTION:  Liberalize diet to 2g sodium Feeding assistance as needed Ensure Enlive po BID, each supplement provides 350 kcal and 20 grams of protein. MVI with minerals daily  NUTRITION DIAGNOSIS:   Moderate Malnutrition related to social / environmental circumstances (homeless, polysubstance use) as evidenced by moderate fat depletion, moderate muscle depletion.  GOAL:   Patient will meet greater than or equal to 90% of their needs  MONITOR:   PO intake, Supplement acceptance, Diet advancement, Labs, Weight trends, I & O's  REASON FOR ASSESSMENT:   Consult Other (Comment) (nutrition goals)  ASSESSMENT:   Pt is homeless. Admitted with CP and SOB. 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  Food allergies: shellfish allergy, peanut-containing drug products  Pt sleeping at time of visit. No family present at bedside. Pt awoke to repeat shoulder tap however did not provide meaningful information. Breakfast delivered at that time and pt requesting feeding assistance. Spoke with NT regarding feeding assistance however NT concerned with feeding at that time d/t drowsiness.   Meal completions: 5/9: 100% lunch   Reviewed documented weight history. Within the last year, pt's weight noted to be widely variable however overall noted to have trended down. Noted a 10.3% weight loss within the last 5 months which is concerning but not clinically significant for time frame. Uncertain of pt's dry weight as he has CHF and is noted to be noncompliant with home medications.  Medications: colace, folvite, lasix 40mg  BID, MVI, thiamine   Labs: sodium 134, Cr 1.69, GFR 49  UOP: x12 hours I/O's: - since admission  NUTRITION - FOCUSED PHYSICAL  EXAM:  Flowsheet Row Most Recent Value  Orbital Region Mild depletion  Upper Arm Region Moderate depletion  Thoracic and Lumbar Region Mild depletion  Buccal Region Moderate depletion  Temple Region No depletion  Clavicle Bone Region Moderate depletion  Clavicle and Acromion Bone Region Severe depletion  Scapular Bone Region Moderate depletion  Dorsal Hand No depletion  Patellar Region Moderate depletion  Anterior Thigh Region Moderate depletion  Posterior Calf Region Mild depletion  Edema (RD Assessment) None  Hair Reviewed  Eyes Unable to assess  Mouth Other (Comment)  [coating on bottom teeth]  Skin Reviewed  Nails Reviewed       Diet Order:   Diet Order             Diet 2 gram sodium Room service appropriate? Yes; Fluid consistency: Thin; Fluid restriction: 1500 mL Fluid  Diet effective now                   EDUCATION NEEDS:   Not appropriate for education at this time  Skin:  Skin Assessment: Reviewed RN Assessment  Last BM:  5/8  Height:   Ht Readings from Last 1 Encounters:  11/28/22 5\' 7"  (1.702 m)    Weight:   Wt Readings from Last 1 Encounters:  11/29/22 69.3 kg   BMI:  Body mass index is 23.93 kg/m.  Estimated Nutritional Needs:   Kcal:  1900-2100  Protein:  95-110g  Fluid:  >/=1.9L  Drusilla Kanner, RDN, LDN Clinical Nutrition

## 2022-11-30 DIAGNOSIS — F1410 Cocaine abuse, uncomplicated: Secondary | ICD-10-CM | POA: Diagnosis not present

## 2022-11-30 DIAGNOSIS — E440 Moderate protein-calorie malnutrition: Secondary | ICD-10-CM | POA: Insufficient documentation

## 2022-11-30 DIAGNOSIS — I5043 Acute on chronic combined systolic (congestive) and diastolic (congestive) heart failure: Secondary | ICD-10-CM | POA: Diagnosis not present

## 2022-11-30 DIAGNOSIS — Z91148 Patient's other noncompliance with medication regimen for other reason: Secondary | ICD-10-CM | POA: Diagnosis not present

## 2022-11-30 DIAGNOSIS — Z5900 Homelessness unspecified: Secondary | ICD-10-CM | POA: Diagnosis not present

## 2022-11-30 LAB — COMPREHENSIVE METABOLIC PANEL
ALT: 12 U/L (ref 0–44)
AST: 17 U/L (ref 15–41)
Albumin: 2.2 g/dL — ABNORMAL LOW (ref 3.5–5.0)
Alkaline Phosphatase: 83 U/L (ref 38–126)
Anion gap: 10 (ref 5–15)
BUN: 21 mg/dL — ABNORMAL HIGH (ref 6–20)
CO2: 32 mmol/L (ref 22–32)
Calcium: 8.6 mg/dL — ABNORMAL LOW (ref 8.9–10.3)
Chloride: 90 mmol/L — ABNORMAL LOW (ref 98–111)
Creatinine, Ser: 1.77 mg/dL — ABNORMAL HIGH (ref 0.61–1.24)
GFR, Estimated: 46 mL/min — ABNORMAL LOW (ref >60)
Glucose, Bld: 100 mg/dL — ABNORMAL HIGH (ref 70–99)
Potassium: 3.7 mmol/L (ref 3.5–5.1)
Sodium: 132 mmol/L — ABNORMAL LOW (ref 135–145)
Total Bilirubin: 1.1 mg/dL (ref 0.3–1.2)
Total Protein: 6.1 g/dL — ABNORMAL LOW (ref 6.5–8.1)

## 2022-11-30 LAB — CBC
HCT: 40 % (ref 39.0–52.0)
Hemoglobin: 13.2 g/dL (ref 13.0–17.0)
MCH: 30.8 pg (ref 26.0–34.0)
MCHC: 33 g/dL (ref 30.0–36.0)
MCV: 93.5 fL (ref 80.0–100.0)
Platelets: 245 10*3/uL (ref 150–400)
RBC: 4.28 MIL/uL (ref 4.22–5.81)
RDW: 13.7 % (ref 11.5–15.5)
WBC: 16.6 10*3/uL — ABNORMAL HIGH (ref 4.0–10.5)
nRBC: 0 % (ref 0.0–0.2)

## 2022-11-30 LAB — CULTURE, BLOOD (ROUTINE X 2): Culture: NO GROWTH

## 2022-11-30 MED ORDER — ALBUTEROL SULFATE (2.5 MG/3ML) 0.083% IN NEBU: 2.5000 mg | INHALATION_SOLUTION | RESPIRATORY_TRACT | Status: DC | PRN

## 2022-11-30 MED ORDER — POLYVINYL ALCOHOL 1.4 % OP SOLN: 1.0000 [drp] | OPHTHALMIC | Status: DC | PRN

## 2022-11-30 MED ORDER — MORPHINE SULFATE (PF) 2 MG/ML IV SOLN
1.0000 mg | INTRAVENOUS | Status: DC | PRN
  Administered 2022-11-30 – 2022-12-01 (×3): 1 mg via INTRAVENOUS
  Filled 2022-11-30 (×3): qty 1

## 2022-11-30 MED ORDER — SPIRONOLACTONE 12.5 MG HALF TABLET
12.5000 mg | ORAL_TABLET | Freq: Every day | ORAL | Status: DC
  Administered 2022-11-30 – 2022-12-03 (×4): 12.5 mg via ORAL
  Filled 2022-11-30 (×4): qty 1

## 2022-11-30 MED ORDER — IPRATROPIUM-ALBUTEROL 0.5-2.5 (3) MG/3ML IN SOLN
3.0000 mL | Freq: Four times a day (QID) | RESPIRATORY_TRACT | Status: DC
  Administered 2022-11-30 – 2022-12-01 (×3): 3 mL via RESPIRATORY_TRACT
  Filled 2022-11-30 (×4): qty 3

## 2022-11-30 MED ORDER — EMPAGLIFLOZIN 10 MG PO TABS
10.0000 mg | ORAL_TABLET | Freq: Every day | ORAL | Status: DC
  Administered 2022-11-30 – 2022-12-05 (×6): 10 mg via ORAL
  Filled 2022-11-30 (×6): qty 1

## 2022-11-30 NOTE — Progress Notes (Signed)
DAILY PROGRESS NOTE   Patient Name: Craig Schmidt Date of Encounter: 11/30/2022 Cardiologist: [name], MD  Chief Complaint   No complaints  Patient Profile   Craig Schmidt is a 52 y.o. male with a hx of chronic systolic CHF dx 08/2021 when he presented with stroke requiring TNK, moderate mitral valve stenosis, inferolateral STEMI 11/2021 with 100% apical LAD suspected embolic versus plaque rupture with thrombosis, homelessness, cocaine abuse, ETOH abuse, THC use,  left vestibular schwannoma, CKD 3a, medication noncompliance who is being seen 11/28/2022 for the evaluation of CHF at the request of Dr. Ophelia Charter   Subjective   Diuresed 2.4L Negative, now 3.9L total negative. LVEF on echo about 25-30%. BMET is pending today. UDS positive for cocaine. Restarted HF meds.  Objective   Vitals:   11/29/22 2143 11/29/22 2346 11/30/22 0305 11/30/22 0325  BP: 115/79 115/78 119/79   Pulse: 95 94 100   Resp:   18   Temp:  99 F (37.2 C) 100.3 F (37.9 C)   TempSrc:  Oral Oral   SpO2:  93% 94%   Weight:    67 kg  Height:        Intake/Output Summary (Last 24 hours) at 11/30/2022 0742 Last data filed at 11/30/2022 0415 Gross per 24 hour  Intake 464.08 ml  Output 2900 ml  Net -2435.92 ml   Filed Weights   11/28/22 1545 11/29/22 0341 11/30/22 0325  Weight: 68.5 kg 69.3 kg 67 kg    Physical Exam   General appearance: somnolent, no distress Neck: no carotid bruit, no JVD, and thyroid not enlarged, symmetric, no tenderness/mass/nodules Lungs: diminished breath sounds bibasilar Heart: regular tachycardia Abdomen: scaphoid, soft, non-tender Extremities: edema 1+ bilateral LE Pulses: 2+ and symmetric Skin: Skin color, texture, turgor normal. No rashes or lesions Neurologic: Mental status: somnolent Psych: Cannot assess  Inpatient Medications    Scheduled Meds:  apixaban  5 mg Oral BID   atorvastatin  40 mg Oral Daily   carvedilol  3.125 mg Oral BID   clopidogrel   75 mg Oral Daily   docusate sodium  100 mg Oral BID   feeding supplement  237 mL Oral BID BM   folic acid  1 mg Oral Daily   furosemide  40 mg Intravenous BID   isosorbide-hydrALAZINE  0.5 tablet Oral TID   lidocaine  1 patch Transdermal Q24H   multivitamin with minerals  1 tablet Oral Daily   nicotine  21 mg Transdermal Daily   sodium chloride flush  3 mL Intravenous Q12H   thiamine  100 mg Oral Daily   Or   thiamine  100 mg Intravenous Daily    Continuous Infusions:  ampicillin-sulbactam (UNASYN) IV Stopped (11/30/22 0415)    PRN Meds: acetaminophen **OR** acetaminophen, bisacodyl, hydrALAZINE, hydrOXYzine, loperamide, methocarbamol, morphine injection, ondansetron **OR** ondansetron (ZOFRAN) IV, polyethylene glycol   Labs   Results for orders placed or performed during the hospital encounter of 11/28/22 (from the past 48 hour(s))  Brain natriuretic peptide     Status: Abnormal   Collection Time: 11/28/22 11:52 AM  Result Value Ref Range   B Natriuretic Peptide 2,810.5 (H) 0.0 - 100.0 pg/mL    Comment: Performed at [facility], [address] [place], [place] [address]  Comprehensive metabolic panel     Status: Abnormal   Collection Time: 11/28/22 11:55 AM  Result Value Ref Range   Sodium 134 (L) 135 - 145 mmol/L   Potassium 3.5  3.5 - 5.1 mmol/L   Chloride 106 98 - 111 mmol/L   CO2 18 (L) 22 - 32 mmol/L   Glucose, Bld 107 (H) 70 - 99 mg/dL    Comment: Glucose reference range applies only to samples taken after fasting for at least 8 hours.   BUN 17 6 - 20 mg/dL   Creatinine, Ser 5.62 (H) 0.61 - 1.24 mg/dL   Calcium 7.6 (L) 8.9 - 10.3 mg/dL   Total Protein 5.0 (L) 6.5 - 8.1 g/dL   Albumin 2.3 (L) 3.5 - 5.0 g/dL   AST 35 15 - 41 U/L   ALT 14 0 - 44 U/L   Alkaline Phosphatase 81 38 - 126 U/L   Total Bilirubin 1.0 0.3 - 1.2 mg/dL   GFR, Estimated 49 (L) >60 mL/min    Comment: (NOTE) Calculated using the CKD-EPI Creatinine Equation (2021)    Anion gap 10 5 -  15    Comment: Performed at [facility], [address], [place], [place] [address]  CBC with Differential     Status: Abnormal   Collection Time: 11/28/22 11:55 AM  Result Value Ref Range   WBC 10.6 (H) 4.0 - 10.5 K/uL   RBC 3.42 (L) 4.22 - 5.81 MIL/uL   Hemoglobin 10.7 (L) 13.0 - 17.0 g/dL   HCT 57.8 (L) 46.9 - 62.9 %   MCV 95.3 80.0 - 100.0 fL   MCH 31.3 26.0 - 34.0 pg   MCHC 32.8 30.0 - 36.0 g/dL   RDW 52.8 41.3 - 24.4 %   Platelets 189 150 - 400 K/uL   nRBC 0.0 0.0 - 0.2 %   Neutrophils Relative % 78 %   Neutro Abs 8.4 (H) 1.7 - 7.7 K/uL   Lymphocytes Relative 12 %   Lymphs Abs 1.3 0.7 - 4.0 K/uL   Monocytes Relative 8 %   Monocytes Absolute 0.8 0.1 - 1.0 K/uL   Eosinophils Relative 1 %   Eosinophils Absolute 0.1 0.0 - 0.5 K/uL   Basophils Relative 0 %   Basophils Absolute 0.0 0.0 - 0.1 K/uL   Immature Granulocytes 1 %   Abs Immature Granulocytes 0.06 0.00 - 0.07 K/uL    Comment: Performed at [facility], [address], [place], [place] [address]  Troponin I (High Sensitivity)     Status: Abnormal   Collection Time: 11/28/22  1:48 PM  Result Value Ref Range   Troponin I (High Sensitivity) 175 (HH) <18 ng/L    Comment: CRITICAL RESULT CALLED TO, READ BACK BY AND VERIFIED WITH P.BLANCHARD RN 1452 11/28/22 MCCORMICK K (NOTE) Elevated high sensitivity troponin I (hsTnI) values and significant  changes across serial measurements may suggest ACS but many other  chronic and acute conditions are known to elevate hsTnI results.  Refer to the "Links" section for chest pain algorithms and additional  guidance. Performed at [facility], [address], [place], [place] [address]   HIV Antibody (routine testing w rflx)     Status: None   Collection Time: 11/28/22  4:46 PM  Result Value Ref Range   HIV Screen 4th Generation wRfx Non Reactive Non Reactive    Comment: Performed at [facility], [address], [place], [place] [address]  Troponin I  (High Sensitivity)     Status: Abnormal   Collection Time: 11/28/22  5:43 PM  Result Value Ref Range   Troponin I (High Sensitivity) 311 (HH) <18 ng/L    Comment:  CRITICAL VALUE NOTED. VALUE IS CONSISTENT WITH PREVIOUSLY REPORTED/CALLED VALUE (NOTE) Elevated high sensitivity troponin I (hsTnI) values and significant  changes across serial measurements may suggest ACS but many other  chronic and acute conditions are known to elevate hsTnI results.  Refer to the "Links" section for chest pain algorithms and additional  guidance. Performed at [facility], [address] [place], [place] [address]   Hemoglobin A1c     Status: None   Collection Time: 11/28/22  5:43 PM  Result Value Ref Range   Hgb A1c MFr Bld 5.4 4.8 - 5.6 %    Comment: (NOTE) Pre diabetes:          5.7%-6.4%  Diabetes:              >6.4%  Glycemic control for   <7.0% adults with diabetes    Mean Plasma Glucose 108.28 mg/dL    Comment: Performed at [facility], [address], [place], [place] [address]  Lipid panel     Status: Abnormal   Collection Time: 11/28/22  5:43 PM  Result Value Ref Range   Cholesterol 118 0 - 200 mg/dL   Triglycerides 60 <782 mg/dL   HDL 27 (L) >95 mg/dL   Total CHOL/HDL Ratio 4.4 RATIO   VLDL 12 0 - 40 mg/dL   LDL Cholesterol 79 0 - 99 mg/dL    Comment:        Total Cholesterol/HDL:CHD Risk Coronary Heart Disease Risk Table                     Men   Women  1/2 Average Risk   3.4   3.3  Average Risk       5.0   4.4  2 X Average Risk   9.6   7.1  3 X Average Risk  23.4   11.0        Use the calculated Patient Ratio above and the CHD Risk Table to determine the patient's CHD Risk.        ATP III CLASSIFICATION (LDL):  <100     mg/dL   Optimal  621-308  mg/dL   Near or Above                    Optimal  130-159  mg/dL   Borderline  657-846  mg/dL   High  >962     mg/dL   Very High Performed at [facility], [address], [place], [place] [address]    TSH     Status: None   Collection Time: 11/28/22  5:43 PM  Result Value Ref Range   TSH 1.196 0.350 - 4.500 uIU/mL    Comment: Performed by a 3rd Generation assay with a functional sensitivity of <=0.01 uIU/mL. Performed at [facility], [address] [place], [place] [address]   Heparin level (unfractionated)     Status: None   Collection Time: 11/28/22 10:38 PM  Result Value Ref Range   Heparin Unfractionated 0.64 0.30 - 0.70 IU/mL    Comment: (NOTE) The clinical reportable range upper limit is being lowered to >1.10 to align with the FDA approved guidance for the current laboratory assay.  If heparin results are below expected values, and patient dosage has  been confirmed, suggest follow up testing of antithrombin III levels. Performed at [facility], [address] [place], [place] [address]   APTT  Status: None   Collection Time: 11/28/22 10:38 PM  Result Value Ref Range   aPTT 34 24 - 36 seconds    Comment: Performed at [facility], [address], [place], [place] [address]  Troponin I (High Sensitivity)     Status: Abnormal   Collection Time: 11/28/22 10:38 PM  Result Value Ref Range   Troponin I (High Sensitivity) 267 (HH) <18 ng/L    Comment: CRITICAL VALUE NOTED. VALUE IS CONSISTENT WITH PREVIOUSLY REPORTED/CALLED VALUE (NOTE) Elevated high sensitivity troponin I (hsTnI) values and significant  changes across serial measurements may suggest ACS but many other  chronic and acute conditions are known to elevate hsTnI results.  Refer to the "Links" section for chest pain algorithms and additional  guidance. Performed at [facility], [address] [place], [place] [address]   Ammonia     Status: None   Collection Time: 11/29/22 10:27 AM  Result Value Ref Range   Ammonia 32 9 - 35 umol/L    Comment: Performed at [facility], [address] [place], [place] [address]  Procalcitonin     Status: None   Collection Time:  11/29/22 10:27 AM  Result Value Ref Range   Procalcitonin 0.52 ng/mL    Comment:        Interpretation: PCT > 0.5 ng/mL and <= 2 ng/mL: Systemic infection (sepsis) is possible, but other conditions are known to elevate PCT as well. (NOTE)       Sepsis PCT Algorithm           Lower Respiratory Tract                                      Infection PCT Algorithm    ----------------------------     ----------------------------         PCT < 0.25 ng/mL                PCT < 0.10 ng/mL          Strongly encourage             Strongly discourage   discontinuation of antibiotics    initiation of antibiotics    ----------------------------     -----------------------------       PCT 0.25 - 0.50 ng/mL            PCT 0.10 - 0.25 ng/mL               OR       >80% decrease in PCT            Discourage initiation of                                            antibiotics      Encourage discontinuation           of antibiotics    ----------------------------     -----------------------------         PCT >= 0.50 ng/mL              PCT 0.26 - 0.50 ng/mL                AND       <80% decrease in PCT  Encourage initiation of                                             antibiotics       Encourage continuation           of antibiotics    ----------------------------     -----------------------------        PCT >= 0.50 ng/mL                  PCT > 0.50 ng/mL               AND         increase in PCT                  Strongly encourage                                      initiation of antibiotics    Strongly encourage escalation           of antibiotics                                     -----------------------------                                           PCT <= 0.25 ng/mL                                                 OR                                        > 80% decrease in PCT                                      Discontinue / Do not initiate                                              antibiotics  Performed at [facility], [address] [place], [place] [address]   APTT     Status: None   Collection Time: 11/29/22 10:32 AM  Result Value Ref Range   aPTT 33 24 - 36 seconds    Comment: Performed at [facility], [address], [place], [place] [address]  Heparin level (unfractionated)     Status: Abnormal   Collection Time: 11/29/22 10:32 AM  Result Value Ref Range   Heparin Unfractionated 0.27 (L) 0.30 - 0.70 IU/mL    Comment: (NOTE) The clinical reportable range upper limit is being lowered to >1.10 to align with the FDA approved guidance for the current laboratory assay.  If heparin results  are below expected values, and patient dosage has  been confirmed, suggest follow up testing of antithrombin III levels. Performed at [facility], [address] [place], [place] [address]   CBC     Status: Abnormal   Collection Time: 11/29/22 10:32 AM  Result Value Ref Range   WBC 20.5 (H) 4.0 - 10.5 K/uL   RBC 3.99 (L) 4.22 - 5.81 MIL/uL   Hemoglobin 12.3 (L) 13.0 - 17.0 g/dL   HCT 47.8 (L) 29.5 - 62.1 %   MCV 92.7 80.0 - 100.0 fL   MCH 30.8 26.0 - 34.0 pg   MCHC 33.2 30.0 - 36.0 g/dL   RDW 30.8 65.7 - 84.6 %   Platelets 218 150 - 400 K/uL   nRBC 0.0 0.0 - 0.2 %    Comment: Performed at [facility], [address], [place], [place] [address]  Ethanol     Status: None   Collection Time: 11/29/22 10:32 AM  Result Value Ref Range   Alcohol, Ethyl (B) <10 <10 mg/dL    Comment: (NOTE) Lowest detectable limit for serum alcohol is 10 mg/dL.  For medical purposes only. Performed at [facility], [address], [place], [place] [address]   Rapid urine drug screen (hospital performed)     Status: Abnormal   Collection Time: 11/29/22 10:40 AM  Result Value Ref Range   Opiates NONE DETECTED NONE DETECTED   Cocaine POSITIVE (A) NONE DETECTED   Benzodiazepines NONE DETECTED NONE DETECTED   Amphetamines NONE DETECTED NONE  DETECTED   Tetrahydrocannabinol NONE DETECTED NONE DETECTED   Barbiturates NONE DETECTED NONE DETECTED    Comment: (NOTE) DRUG SCREEN FOR MEDICAL PURPOSES ONLY.  IF CONFIRMATION IS NEEDED FOR ANY PURPOSE, NOTIFY LAB WITHIN 5 DAYS.  LOWEST DETECTABLE LIMITS FOR URINE DRUG SCREEN Drug Class                     Cutoff (ng/mL) Amphetamine and metabolites    1000 Barbiturate and metabolites    200 Benzodiazepine                 200 Opiates and metabolites        300 Cocaine and metabolites        300 THC                            50 Performed at [facility], [address] [place], [place] [address]   Culture, blood (Routine X 2) w Reflex to ID Panel     Status: None (Preliminary result)   Collection Time: 11/29/22  3:03 PM   Specimen: BLOOD LEFT HAND  Result Value Ref Range   Specimen Description BLOOD LEFT HAND    Special Requests      BOTTLES DRAWN AEROBIC ONLY Blood Culture results may not be optimal due to an inadequate volume of blood received in culture bottles Performed at [facility], [address], [place], [place] [address]    Culture PENDING    Report Status PENDING   APTT     Status: None   Collection Time: 11/29/22  7:56 PM  Result Value Ref Range   aPTT 35 24 - 36 seconds    Comment: Performed at [facility], [address], [place], [place] [address]    ECG   N/A  Telemetry   Sinus rhythm - Personally Reviewed  Radiology    ECHOCARDIOGRAM COMPLETE  Result Date: 11/29/2022    ECHOCARDIOGRAM REPORT   Patient Name:   COLEN RENEE Date of Exam: 11/29/2022 Medical Rec #:  [id number]          Height:       67.0 in Accession #:    [id number]         Weight:       152.8 lb Date of Birth:  05-23-1971           BSA:          1.804 m Patient Age:    52 years           BP:           139/108 mmHg Patient Gender: M                  HR:           105 bpm. Exam Location:  Inpatient Procedure: 2D Echo, Cardiac Doppler, Color Doppler and  Intracardiac            Opacification Agent Indications:    I50.40* Unspecified combined systolic (congestive) and diastolic                 (congestive) heart failure  History:        Patient has prior history of Echocardiogram examinations. CHF,                 Previous Myocardial Infarction, Mitral Valve Disease; Risk                 Factors:Hypertension, Dyslipidemia and Current Smoker. ETOH.                 Cocaine use. Mitral stenosis.  Sonographer:    Sheralyn Boatman RDCS Referring Phys: Jonah Blue IMPRESSIONS  1. Left ventricular ejection fraction, by estimation, is 25 to 30%. The left ventricle has severely decreased function. The left ventricle has no regional wall motion abnormalities. The left ventricular internal cavity size was moderately dilated. Left ventricular diastolic parameters are indeterminate. Elevated left atrial pressure.  2. Right ventricular systolic function is mildly reduced. The right ventricular size is mildly enlarged. There is normal pulmonary artery systolic pressure. The estimated right ventricular systolic pressure is 34.8 mmHg.  3. Left atrial size was mild to moderately dilated.  4. Right atrial size was mildly dilated.  5. The mitral valve is abnormally thickened with restricted leaflet motion. The mitral valve leaflets appear to attach to one papillary muscle consistent with parachute mitral valve. There is mild to moderate mitral stenosis with MV mean gradient at HR 105bpm. The mitral valve is abnormal. Mild mitral valve regurgitation. The mean mitral valve gradient is 8.0 mmHg.  6. The aortic valve is tricuspid. There is mild calcification of the aortic valve. There is mild thickening of the aortic valve. Aortic valve regurgitation is trivial. Aortic valve sclerosis/calcification is present, without any evidence of aortic stenosis.  7. The inferior vena cava is normal in size with greater than 50% respiratory variability, suggesting right atrial pressure of 3 mmHg.  Comparison(s): No significant change from prior study. FINDINGS  Left Ventricle: Left ventricular ejection fraction, by estimation, is 25 to 30%. The left ventricle has severely decreased function. The left ventricle has no regional wall motion abnormalities. Definity contrast agent was given IV to delineate the left  ventricular endocardial borders. The left ventricular internal cavity size was moderately dilated. There is no left  ventricular hypertrophy. Left ventricular diastolic parameters are indeterminate. Elevated left atrial pressure. Right Ventricle: The right ventricular size is mildly enlarged. No increase in right ventricular wall thickness. Right ventricular systolic function is mildly reduced. There is normal pulmonary artery systolic pressure. The tricuspid regurgitant velocity  is 2.82 m/s, and with an assumed right atrial pressure of 3 mmHg, the estimated right ventricular systolic pressure is 34.8 mmHg. Left Atrium: Left atrial size was mild to moderately dilated. Right Atrium: Right atrial size was mildly dilated. Pericardium: There is no evidence of pericardial effusion. Mitral Valve: The mitral valve is abnormally thickened with restricted leaflet motion. The mitral valve leaflets appear to attach to one papillary muscle consistent with parachute mitral valve. There is mild to moderate mitral stenosis with MV mean gradient at HR 105bpm. The mitral valve is abnormal. There is moderate thickening of the mitral valve leaflet(s). There is mild calcification of the mitral valve leaflet(s). Mild mitral valve regurgitation. MV peak gradient, 16.5 mmHg. The mean mitral valve gradient is 8.0 mmHg. Tricuspid Valve: The tricuspid valve is normal in structure. Tricuspid valve regurgitation is mild. Aortic Valve: The aortic valve is tricuspid. There is mild calcification of the aortic valve. There is mild thickening of the aortic valve. Aortic valve regurgitation is trivial. Aortic valve  sclerosis/calcification is present, without any evidence of aortic stenosis. Pulmonic Valve: The pulmonic valve was normal in structure. Pulmonic valve regurgitation is trivial. Aorta: The aortic root is normal in size and structure. Venous: The inferior vena cava is normal in size with greater than 50% respiratory variability, suggesting right atrial pressure of 3 mmHg. IAS/Shunts: The atrial septum is grossly normal.  LEFT VENTRICLE PLAX 2D LVIDd:         5.40 cm      Diastology LVIDs:         4.70 cm      LV e' medial:    3.60 cm/s LV PW:         1.70 cm      LV E/e' medial:  52.1 LV IVS:        1.10 cm      LV e' lateral:   2.18 cm/s LVOT diam:     2.40 cm      LV E/e' lateral: 86.0 LV SV:         65 LV SV Index:   36 LVOT Area:     4.52 cm  LV Volumes (MOD) LV vol d, MOD A2C: 148.0 ml LV vol d, MOD A4C: 160.0 ml LV vol s, MOD A2C: 82.4 ml LV vol s, MOD A4C: 125.0 ml LV SV MOD A2C:     65.6 ml LV SV MOD A4C:     160.0 ml LV SV MOD BP:      45.9 ml RIGHT VENTRICLE             IVC RV S prime:     10.20 cm/s  IVC diam: 1.90 cm TAPSE (M-mode): 1.9 cm LEFT ATRIUM             Index        RIGHT ATRIUM           Index LA diam:        4.90 cm 2.72 cm/m   RA Area:     18.60 cm LA Vol (A2C):   47.8 ml 26.50 ml/m  RA Volume:   55.90 ml  30.99 ml/m LA Vol (A4C):   65.1 ml 36.10 ml/m  LA Biplane Vol: 58.2 ml 32.27 ml/m  AORTIC VALVE             PULMONIC VALVE LVOT Vmax:   110.00 cm/s PR End Diast Vel: 1.68 msec LVOT Vmean:  69.400 cm/s LVOT VTI:    0.143 m  AORTA Ao Root diam: 3.10 cm Ao Asc diam:  2.70 cm MITRAL VALVE                TRICUSPID VALVE MV Area (PHT): 5.27 cm     TR Peak grad:   31.8 mmHg MV Area VTI:   1.85 cm     TR Vmax:        282.00 cm/s MV Peak grad:  16.5 mmHg MV Mean grad:  8.0 mmHg     SHUNTS MV Vmax:       2.03 m/s     Systemic VTI:  0.14 m MV Vmean:      126.0 cm/s   Systemic Diam: 2.40 cm MV Decel Time: 144 msec MV E velocity: 187.50 cm/s MV A velocity: 144.00 cm/s MV E/A ratio:  1.30 Laurance Flatten MD Electronically signed by Laurance Flatten MD Signature Date/Time: 11/29/2022/2:32:41 PM    Final    DG Chest 2 View  Result Date: 11/28/2022 CLINICAL DATA:  Left chest pain with shortness of breath. Rhonchi in the upper and lower left lung. Wheezing on the right. EXAM: CHEST - 2 VIEW COMPARISON:  08/03/2022 FINDINGS: Cardiac enlargement. No vascular congestion. Consolidation demonstrated in the left lower lung likely representing pneumonia. Follow-up to resolution is recommended to exclude central obstructing process. Right lung is clear. No pleural effusions. No pneumothorax. Mediastinal contours appear intact. Old right rib fracture. IMPRESSION: 1. Consolidation in the left lower lung likely representing pneumonia. 2. Mild cardiac enlargement. No vascular congestion or edema demonstrated. Electronically Signed   By: Burman Nieves M.D.   On: 11/28/2022 12:31    Cardiac Studies   See echo above  Assessment   Principal Problem:   Acute on chronic combined systolic (congestive) and diastolic (congestive) heart failure (HCC) Active Problems:   Homeless   Cocaine abuse (HCC)   Smoker   Alcohol abuse   ACS (acute coronary syndrome) (HCC)   Back pain   Essential hypertension   Dyslipidemia   Noncompliance with medication regimen   Acute on chronic combined systolic and diastolic CHF (congestive heart failure) (HCC)   Plan   Continue IV diuresis. BMET pending today. LVEF read as 25-30% with, global hypokinesis. No apical thrombus with contrast. On low dose coreg and Bidil, atorvastatin (LDL 79). Vitals stable. UDS +for cocaine. Complex social situation- appreciate SW and case manager assistance.  Time Spent Directly with Patient:  I have spent a total of 25 minutes with the patient reviewing hospital notes, telemetry, EKGs, labs and examining the patient as well as establishing an assessment and plan that was discussed personally with the patient.  > 50% of time was spent in  direct patient care.  Length of Stay:  LOS: 1 day   Chrystie Nose, MD, [facility], FACP  [place]  [facility]  Medical Director of the [facility]  [facility] Reduction [facility] Diplomate of the American Board of Clinical Lipidology Attending Cardiologist  Direct Dial: [phone]  Fax: [phone]  Website:  [email or web address] Nicely Crucita Lacorte 11/30/2022, 7:42 AM

## 2022-11-30 NOTE — Progress Notes (Signed)
H&V Care Navigation CSW Progress Note  Outpatient Heart Failure Clinical Social Worker met with pt to check in.  Pt continues to be somewhat difficult to understand- reportedly in lots of pain and discomfort so could not have in depth conversation at this time.  CSW inquired about current disability status- pt reporting he was just approved and has now received his first check.    CSW sent updated phone number and information regarding pt income to [facility] with hope they will have more options for assistance now that pt has income.  Patient is participating in a Managed Medicaid Plan:  Yes  SDOH Screenings   Food Insecurity: Food Insecurity Present (11/28/2022)  Housing: Medium Risk (11/28/2022)  Transportation Needs: Unmet Transportation Needs (11/28/2022)  Utilities: At Risk (11/28/2022)  Alcohol Screen: High Risk (09/19/2021)  Depression (PHQ2-9): High Risk (06/13/2022)  Financial Resource Strain: High Risk (09/07/2022)  Stress: Stress Concern Present (10/16/2022)  Tobacco Use: High Risk (11/28/2022)    CSW will follow up with pt in HF clinic  Burna Sis, LCSW Clinical Social Worker [facility] Desk#: [phone] Cell#: [phone]

## 2022-11-30 NOTE — Evaluation (Signed)
Clinical/Bedside Swallow Evaluation Patient Details  Name: AZARI KADEN MRN: [id number] Date of Birth: 08-11-1970  Today's Date: 11/30/2022 Time: SLP Start Time (ACUTE ONLY): 1119 SLP Stop Time (ACUTE ONLY): 1131 SLP Time Calculation (min) (ACUTE ONLY): 12 min  Past Medical History:  Past Medical History:  Diagnosis Date   Alcohol abuse    Asthma    Brain tumor (HCC)    CAD (coronary artery disease)    Chronic HFrEF (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)    Chronic kidney disease, stage 3 (HCC)    Cocaine abuse (HCC)    History of medication noncompliance    Homelessness    MI (myocardial infarction) (HCC)    STEMI (ST elevation myocardial infarction) (HCC)    Stroke [facility])    Past Surgical History:  Past Surgical History:  Procedure Laterality Date   BUBBLE STUDY  09/11/2021   Procedure: BUBBLE STUDY;  Surgeon: Pricilla Riffle, MD;  Location: [facility] ENDOSCOPY;  Service: Cardiovascular;;   LEFT HEART CATH AND CORONARY ANGIOGRAPHY N/A 12/19/2021   Procedure: LEFT HEART CATH AND CORONARY ANGIOGRAPHY;  Surgeon: Lyn Records, MD;  Location: [facility] INVASIVE CV LAB;  Service: Cardiovascular;  Laterality: N/A;   RIGHT/LEFT HEART CATH AND CORONARY ANGIOGRAPHY N/A 04/13/2022   Procedure: RIGHT/LEFT HEART CATH AND CORONARY ANGIOGRAPHY;  Surgeon: Laurey Morale, MD;  Location: [facility] INVASIVE CV LAB;  Service: Cardiovascular;  Laterality: N/A;   TEE WITHOUT CARDIOVERSION N/A 09/11/2021   Procedure: TRANSESOPHAGEAL ECHOCARDIOGRAM (TEE);  Surgeon: Pricilla Riffle, MD;  Location: [facility] ENDOSCOPY;  Service: Cardiovascular;  Laterality: N/A;   HPI:  Rema Fendt, NP      52/M w/ tobacco abuse, COPD/asthma, CAD, CVA, chronic combined CHF (ER 30-35% and grade 2 DD in 03/2022), and history of vestibular schwannoma presenting with CP/SOB.  He reports not having access to medications for at least the last 4 months.  Complains of chronic back pain, chest pain, shortness of breath X 1 to 2 days   In  the ER troponin 175, WBC 10, creatinine 1.7, chest x-ray with consolidation of left lower lobe    Assessment / Plan / Recommendation  Clinical Impression  Pt in signficant pain at the time of eval.He is not able to tolerate upright position due to left sided pain. He did not eat much of his breakfast also for this reason, but drank the  juices and ate the purees. SLP observed pt to be able to drink consecutively from straw in a reclined position without signs of aspiration. He is relatively young and denies any history of dysphagia and is unlikely to suffer from  silent aspiration. Pt also denies reflux or regurgitation. Though assessment is limited at this time due to pain, there are no convincing signs of dysphagia to warrant further intervention. Pt to continue diet, hopefully able to sit upright to eat as pain is better controlled. Will sign off. SLP Visit Diagnosis: Dysphagia, oropharyngeal phase (R13.12)    Aspiration Risk  Mild aspiration risk    Diet Recommendation Regular;Thin liquid   Liquid Administration via: Cup;Straw Medication Administration: Whole meds with liquid Supervision: Patient able to self feed    Other  Recommendations      Recommendations for follow up therapy are one component of a multi-disciplinary discharge planning process, led by the attending physician.  Recommendations may be updated based on patient status, additional functional criteria and insurance authorization.  Follow up Recommendations No SLP follow up  Assistance Recommended at Discharge    Functional Status Assessment    Frequency and Duration            Prognosis        Swallow Study   General HPI: Rema Fendt, NP      52/M w/ tobacco abuse, COPD/asthma, CAD, CVA, chronic combined CHF (ER 30-35% and grade 2 DD in 03/2022), and history of vestibular schwannoma presenting with CP/SOB.  He reports not having access to medications for at least the last 4 months.  Complains of chronic  back pain, chest pain, shortness of breath X 1 to 2 days   In the ER troponin 175, WBC 10, creatinine 1.7, chest x-ray with consolidation of left lower lobe Type of Study: Bedside Swallow Evaluation Diet Prior to this Study: Regular;Thin liquids (Level 0) Temperature Spikes Noted: No Respiratory Status: Room air History of Recent Intubation: No Behavior/Cognition: Alert;Cooperative Oral Cavity Assessment: Within Functional Limits Oral Care Completed by SLP: No Oral Cavity - Dentition: Adequate natural dentition Vision: Functional for self-feeding Self-Feeding Abilities: Needs assist Patient Positioning: Postural control interferes with function Baseline Vocal Quality: Normal Volitional Cough: Strong Volitional Swallow: Able to elicit    Oral/Motor/Sensory Function Overall Oral Motor/Sensory Function: Within functional limits   Ice Chips     Thin Liquid Thin Liquid: Within functional limits Presentation: Straw;Self Fed    Nectar Thick Nectar Thick Liquid: Not tested   Honey Thick Honey Thick Liquid: Not tested   Puree Puree: Not tested   Solid     Solid: Not tested      Armoni Kludt, Riley Nearing 11/30/2022,11:39 AM

## 2022-11-30 NOTE — Progress Notes (Signed)
PT Cancellation Note  Patient Details Name: Craig Schmidt MRN: [id number] DOB: 1971/01/25   Cancelled Treatment:    Reason Eval/Treat Not Completed: Patient declined, no reason specified (Pt states that he is trying to rest and currently in a lot of pain. Requesting the RN. Will try again later if time allows.)   [name] 11/30/2022, 11:55 AM

## 2022-11-30 NOTE — Progress Notes (Signed)
Physical Therapy Treatment Patient Details Name: ANGELO BURRIS MRN: [id number] DOB: [birth year] Today's Date: 11/30/2022   History of Present Illness Pt is a 52 year old male admitted on 11/28/22 for chest pain and SOB. Past medical history significant of asthma, CAD, CVA, chronic combined CHF (ER 30-35% and grade 2 DD in 03/2022), and brain tumor    PT Comments    Pt with similar presentation to yesterday. Co treat with OT. Pt continues to be limited by pain and lethargy. No change in DC/DME recs at this time. PT will continue to follow.   Recommendations for follow up therapy are one component of a multi-disciplinary discharge planning process, led by the attending physician.  Recommendations may be updated based on patient status, additional functional criteria and insurance authorization.  Follow Up Recommendations       Assistance Recommended at Discharge Frequent or constant Supervision/Assistance  Patient can return home with the following A lot of help with walking and/or transfers;A lot of help with bathing/dressing/bathroom;Assistance with cooking/housework;Direct supervision/assist for medications management;Direct supervision/assist for financial management;Assistance with feeding;Assist for transportation;Help with stairs or ramp for entrance   Equipment Recommendations  Other (comment) (Per accepting facility)    Recommendations for Other Services       Precautions / Restrictions Precautions Precautions: Fall Restrictions Weight Bearing Restrictions: No     Mobility  Bed Mobility Overal bed mobility: Needs Assistance Bed Mobility: Supine to Sit, Sit to Supine     Supine to sit: +2 for physical assistance, Max assist Sit to supine: +2 for physical assistance, Max assist   General bed mobility comments: pt unable to clear bed with BLEs, needing assist with trunk and LE control to return to supine    Transfers Overall transfer level: Needs  assistance Equipment used: 2 person hand held assist Transfers: Sit to/from Stand Sit to Stand: Min assist, +2 physical assistance           General transfer comment: verbal cues for upright posture    Ambulation/Gait Ambulation/Gait assistance: +2 physical assistance, Mod assist Gait Distance (Feet): 3 Feet Assistive device: 2 person hand held assist Gait Pattern/deviations: Trunk flexed, Staggering right Gait velocity: decreased     General Gait Details: Pt only able to take sidesteps along EOB. Cues for foot placement   Stairs             Wheelchair Mobility    Modified Rankin (Stroke Patients Only)       Balance Overall balance assessment: Needs assistance Sitting-balance support: Feet supported Sitting balance-Leahy Scale: Fair Sitting balance - Comments: Pt with a heavy anterior lean Postural control: Other (comment) (Anterior lean) Standing balance support: Bilateral upper extremity supported, During functional activity Standing balance-Leahy Scale: Poor Standing balance comment: Reliant on +2 HHA                            Cognition Arousal/Alertness: Lethargic Behavior During Therapy: Restless, Flat affect Overall Cognitive Status: No family/caregiver present to determine baseline cognitive functioning                                 General Comments: Pt following one step commands with increased time        Exercises      General Comments General comments (skin integrity, edema, etc.): HR sitting at 109 with functional activity, pt on 3L with Sp02 89-93%  Pertinent Vitals/Pain Pain Assessment Pain Assessment: Faces Faces Pain Scale: Hurts whole lot Pain Location: "ribs" Pain Descriptors / Indicators: Constant, Discomfort, Grimacing, Guarding, Moaning Pain Intervention(s): Limited activity within patient's tolerance, Monitored during session, Premedicated before session    Home Living                           Prior Function            PT Goals (current goals can now be found in the care plan section) Progress towards PT goals: Progressing toward goals    Frequency    Min 1X/week      PT Plan Current plan remains appropriate    Co-evaluation PT/OT/SLP Co-Evaluation/Treatment: Yes Reason for Co-Treatment: Complexity of the patient's impairments (multi-system involvement) PT goals addressed during session: Mobility/safety with mobility OT goals addressed during session: ADL's and self-care      AM-PAC PT "6 Clicks" Mobility   Outcome Measure  Help needed turning from your back to your side while in a flat bed without using bedrails?: A Little Help needed moving from lying on your back to sitting on the side of a flat bed without using bedrails?: A Lot Help needed moving to and from a bed to a chair (including a wheelchair)?: A Lot Help needed standing up from a chair using your arms (e.g., wheelchair or bedside chair)?: A Lot Help needed to walk in hospital room?: Total Help needed climbing 3-5 steps with a railing? : Total 6 Click Score: 11    End of Session Equipment Utilized During Treatment: Oxygen Activity Tolerance: Patient limited by pain;Patient limited by lethargy Patient left: in bed;with call bell/phone within reach;with bed alarm set Nurse Communication: Mobility status PT Visit Diagnosis: Other abnormalities of gait and mobility (R26.89)     Time: 2130-8657 PT Time Calculation (min) (ACUTE ONLY): 25 min  Charges:  $Therapeutic Activity: 8-22 mins                     Shela Nevin, PT, DPT Acute Rehab Services [phone]    [name] 11/30/2022, 4:31 PM

## 2022-11-30 NOTE — Progress Notes (Signed)
Occupational Therapy Treatment Patient Details Name: Craig Schmidt MRN: [id number] DOB: 08-Feb-1971 Today's Date: 11/30/2022   History of present illness Pt is a 52 year old male admitted on 11/28/22 for chest pain and SOB. Past medical history significant of asthma, CAD, CVA, chronic combined CHF (ER 30-35% and grade 2 DD in 03/2022), and brain tumor   OT comments  Pt Min A x2 HHA for STS but continuing to be limited by pain with functional activity, today pt c/o new pain in ribs. Pt found to have trigger points in the medial lower traps, nothing was noted during palpation of his ribs intially. After repetitive probing pt noted pain along ribs 7-9. OT to continue to progress pt as able. DC plans remain appropriate for SNF at this time.   Recommendations for follow up therapy are one component of a multi-disciplinary discharge planning process, led by the attending physician.  Recommendations may be updated based on patient status, additional functional criteria and insurance authorization.    Assistance Recommended at Discharge Frequent or constant Supervision/Assistance  Patient can return home with the following  A lot of help with walking and/or transfers;A lot of help with bathing/dressing/bathroom;Assistance with cooking/housework;Direct supervision/assist for financial management;Direct supervision/assist for medications management;Assist for transportation;Help with stairs or ramp for entrance   Equipment Recommendations  Other (comment) (Defer to next level of care)    Recommendations for Other Services      Precautions / Restrictions Precautions Precautions: Fall Restrictions Weight Bearing Restrictions: No       Mobility Bed Mobility Overal bed mobility: Needs Assistance Bed Mobility: Supine to Sit, Sit to Supine     Supine to sit: Max assist Sit to supine: Max assist   General bed mobility comments: Pt c/o severe pain with functional activity, needing Max A for bed  mobility today    Transfers Overall transfer level: Needs assistance Equipment used: 2 person hand held assist Transfers: Sit to/from Stand Sit to Stand: Min assist, +2 physical assistance                 Balance Overall balance assessment: Needs assistance Sitting-balance support: Feet supported Sitting balance-Leahy Scale: Fair     Standing balance support: Bilateral upper extremity supported, During functional activity Standing balance-Leahy Scale: Poor Standing balance comment: Reliant on +2 HHA                           ADL either performed or assessed with clinical judgement   ADL                                       Functional mobility during ADLs: Minimal assistance;+2 for physical assistance (stood at bedside) General ADL Comments: pt limited in OOB mobility by pain    Extremity/Trunk Assessment              Vision       Perception     Praxis      Cognition Arousal/Alertness: Lethargic Behavior During Therapy: Restless, Flat affect Overall Cognitive Status: No family/caregiver present to determine baseline cognitive functioning                                          Exercises Other Exercises Other Exercises: AROM chest opening with  scapular retraction x5 reps    Shoulder Instructions       General Comments HR sitting at 109 with functional activity, pt on 3L with Sp02 89-93%    Pertinent Vitals/ Pain       Pain Assessment Pain Assessment: Faces Faces Pain Scale: Hurts worst Pain Location: cramping on left side, "feels like a broken rib" Pain Descriptors / Indicators: Constant, Discomfort, Grimacing, Guarding, Moaning Pain Intervention(s): Limited activity within patient's tolerance, Monitored during session  Home Living                                          Prior Functioning/Environment              Frequency  Min 2X/week        Progress Toward  Goals  OT Goals(current goals can now be found in the care plan section)  Progress towards OT goals: Progressing toward goals  Acute Rehab OT Goals Patient Stated Goal: none stated OT Goal Formulation: With patient Time For Goal Achievement: 12/13/22 Potential to Achieve Goals: Good  Plan Discharge plan remains appropriate;Frequency remains appropriate    Co-evaluation    PT/OT/SLP Co-Evaluation/Treatment: Yes Reason for Co-Treatment: Complexity of the patient's impairments (multi-system involvement) PT goals addressed during session: Mobility/safety with mobility OT goals addressed during session: ADL's and self-care      AM-PAC OT "6 Clicks" Daily Activity     Outcome Measure   Help from another person eating meals?: A Little Help from another person taking care of personal grooming?: A Little Help from another person toileting, which includes using toliet, bedpan, or urinal?: A Lot Help from another person bathing (including washing, rinsing, drying)?: A Lot Help from another person to put on and taking off regular upper body clothing?: A Little Help from another person to put on and taking off regular lower body clothing?: Total 6 Click Score: 14    End of Session    OT Visit Diagnosis: Unsteadiness on feet (R26.81);Other abnormalities of gait and mobility (R26.89);History of falling (Z91.81);Pain   Activity Tolerance Patient limited by pain   Patient Left in bed;with call bell/phone within reach;with bed alarm set   Nurse Communication Mobility status        Time: 4098-1191 OT Time Calculation (min): 26 min  Charges: OT General Charges $OT Visit: 1 Visit OT Treatments $Therapeutic Activity: 8-22 mins  11/30/2022  AB, OTR/L  Acute Rehabilitation Services  Office: [phone]   Craig Schmidt 11/30/2022, 4:32 PM

## 2022-11-30 NOTE — Progress Notes (Addendum)
PROGRESS NOTE    Craig Schmidt  ZOX:[id number] DOB: 05-30-1971 DOA: 11/28/2022 PCP: Rema Fendt, NP  52/M w/ tobacco abuse, COPD/asthma, CAD, CVA, chronic combined CHF (ER 30-35% and grade 2 DD in 03/2022), and history of vestibular schwannoma presenting with CP/SOB.  He reports not having access to medications for at least the last 4 months.  Complains of chronic back pain, chest pain, shortness of breath X 1 to 2 days  In the ER troponin 175, WBC 10, creatinine 1.7, chest x-ray with consolidation of left lower lobe,  Subjective: -Feels little better, cough and left-sided chest pain  Assessment and Plan:  Acute on chronic combined CHF History of moderate mitral stenosis -Known mixed cardiomyopathy, EF 30% in 9/23 -Repeat echo with EF 25%, mildly reduced RV  -Noncompliant with medications, complicated by homelessness and substance abuse -Cards following, continue IV Lasix today, Jardiance restarted, resumed low-dose Aldactone, -Continue Coreg and BiDil -PT OT eval, likely difficult placement per TOC  Aspiration pneumonia -Likely in the background of EtOH/substance abuse, also has a history of CVA -Continue IV Unasyn, day 2, add DuoNeb's, pulmonary toilet -SLP eval  History of CAD Demand ischemia -Troponins mildly elevated 175, 267, 311 -Cards following, Rx w/ IV heparin, now off -Continue Coreg, BiDil, statin  Lethargy, mild encephalopathy -Multifactorial, improving, ammonia level was normal  History 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 -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, supposed to be on Eliquis -Resumed Eliquis  History of vestibular schwannoma -Unclear when this was diagnosed or whether he's had any workup for it  Polysubstance abuse Cocaine, alcohol abuse -UDS positive for cocaine, counseled -Social work consult appreciated  Noncompliance Homelessness   DVT prophylaxis: IV heparin Code Status: Full code Family Communication: Roberts Gaudy at bedside  yesterday Disposition Plan: SNF  Consultants:    Procedures:   Antimicrobials:    Objective: Vitals:   11/29/22 2346 11/30/22 0305 11/30/22 0325 11/30/22 0755  BP: 115/78 119/79  124/89  Pulse: 94 100  91  Resp:  18  19  Temp: 99 F (37.2 C) 100.3 F (37.9 C)  97.8 F (36.6 C)  TempSrc: Oral Oral  Oral  SpO2: 93% 94%  98%  Weight:   67 kg   Height:        Intake/Output Summary (Last 24 hours) at 11/30/2022 1205 Last data filed at 11/30/2022 0929 Gross per 24 hour  Intake 398.02 ml  Output 2000 ml  Net -1601.98 ml   Filed Weights   11/28/22 1545 11/29/22 0341 11/30/22 0325  Weight: 68.5 kg 69.3 kg 67 kg    Examination:  General exam: Chronically ill male sitting up in bed, AAO x 3 HEENT: Positive JVD CVS: S1-S2, regular rhythm, tachycardic Lungs: Decreased breath sounds on the left, few basilar rales Abdomen: Soft, nontender, sounds present  Extremities: 1-2+ edema   Data Reviewed:   CBC: Recent Labs  Lab 11/28/22 1155 11/29/22 1032 11/30/22 0931  WBC 10.6* 20.5* 16.6*  NEUTROABS 8.4*  --   --   HGB 10.7* 12.3* 13.2  HCT 32.6* 37.0* 40.0  MCV 95.3 92.7 93.5  PLT 189 218 245   Basic Metabolic Panel: Recent Labs  Lab 11/28/22 1155 11/30/22 0931  NA 134* 132*  K 3.5 3.7  CL 106 90*  CO2 18* 32  GLUCOSE 107* 100*  BUN 17 21*  CREATININE 1.69* 1.77*  CALCIUM 7.6* 8.6*   GFR: Estimated Creatinine Clearance: 46.7 mL/min (A) (by C-G formula based on SCr of 1.77 mg/dL (  H)). Liver Function Tests: Recent Labs  Lab 11/28/22 1155 11/30/22 0931  AST 35 17  ALT 14 12  ALKPHOS 81 83  BILITOT 1.0 1.1  PROT 5.0* 6.1*  ALBUMIN 2.3* 2.2*   No results for input(s): "LIPASE", "AMYLASE" in the last 168 hours. Recent Labs  Lab 11/29/22 1027  AMMONIA 32   Coagulation Profile: No results for input(s): "INR", "PROTIME" in the last 168 hours. Cardiac Enzymes: No results for input(s): "CKTOTAL", "CKMB", "CKMBINDEX", "TROPONINI" in the last 168  hours. BNP (last 3 results) No results for input(s): "PROBNP" in the last 8760 hours. HbA1C: Recent Labs    11/28/22 1743  HGBA1C 5.4   CBG: No results for input(s): "GLUCAP" in the last 168 hours. Lipid Profile: Recent Labs    11/28/22 1743  CHOL 118  HDL 27*  LDLCALC 79  TRIG 60  CHOLHDL 4.4   Thyroid Function Tests: Recent Labs    11/28/22 1743  TSH 1.196   Anemia Panel: No results for input(s): "VITAMINB12", "FOLATE", "FERRITIN", "TIBC", "IRON", "RETICCTPCT" in the last 72 hours. Urine analysis:    Component Value Date/Time   COLORURINE YELLOW 07/17/2022 1622   APPEARANCEUR HAZY (A) 07/17/2022 1622   LABSPEC 1.023 07/17/2022 1622   PHURINE 5.0 07/17/2022 1622   GLUCOSEU >=500 (A) 07/17/2022 1622   HGBUR NEGATIVE 07/17/2022 1622   BILIRUBINUR NEGATIVE 07/17/2022 1622   KETONESUR NEGATIVE 07/17/2022 1622   PROTEINUR 100 (A) 07/17/2022 1622   UROBILINOGEN 0.2 05/14/2015 0845   NITRITE NEGATIVE 07/17/2022 1622   LEUKOCYTESUR NEGATIVE 07/17/2022 1622   Sepsis Labs: @LABRCNTIP (procalcitonin:4,lacticidven:4)  ) Recent Results (from the past 240 hour(s))  Culture, blood (Routine X 2) w Reflex to ID Panel     Status: None (Preliminary result)   Collection Time: 11/29/22  3:01 PM   Specimen: BLOOD LEFT HAND  Result Value Ref Range Status   Specimen Description BLOOD LEFT HAND  Final   Special Requests   Final    BOTTLES DRAWN AEROBIC ONLY Blood Culture results may not be optimal due to an inadequate volume of blood received in culture bottles   Culture   Final    NO GROWTH < 24 HOURS Performed at [facility], [address], [place], [place] [address]    Report Status PENDING  Incomplete  Culture, blood (Routine X 2) w Reflex to ID Panel     Status: None (Preliminary result)   Collection Time: 11/29/22  3:03 PM   Specimen: BLOOD LEFT HAND  Result Value Ref Range Status   Specimen Description BLOOD LEFT HAND  Final   Special Requests   Final     BOTTLES DRAWN AEROBIC ONLY Blood Culture results may not be optimal due to an inadequate volume of blood received in culture bottles   Culture   Final    NO GROWTH < 24 HOURS Performed at [facility], [address] [place], [place] [address]    Report Status PENDING  Incomplete     Radiology Studies: ECHOCARDIOGRAM COMPLETE  Result Date: 11/29/2022    ECHOCARDIOGRAM REPORT   Patient Name:   DEVONN FALLONE Date of Exam: 11/29/2022 Medical Rec #:  [id number]          Height:       67.0 in Accession #:    [id number]         Weight:       152.8 lb Date of Birth:  08/21/1970  BSA:          1.804 m Patient Age:    52 years           BP:           139/108 mmHg Patient Gender: M                  HR:           105 bpm. Exam Location:  Inpatient Procedure: 2D Echo, Cardiac Doppler, Color Doppler and Intracardiac            Opacification Agent Indications:    I50.40* Unspecified combined systolic (congestive) and diastolic                 (congestive) heart failure  History:        Patient has prior history of Echocardiogram examinations. CHF,                 Previous Myocardial Infarction, Mitral Valve Disease; Risk                 Factors:Hypertension, Dyslipidemia and Current Smoker. ETOH.                 Cocaine use. Mitral stenosis.  Sonographer:    Sheralyn Boatman RDCS Referring Phys: Jonah Blue IMPRESSIONS  1. Left ventricular ejection fraction, by estimation, is 25 to 30%. The left ventricle has severely decreased function. The left ventricle has no regional wall motion abnormalities. The left ventricular internal cavity size was moderately dilated. Left ventricular diastolic parameters are indeterminate. Elevated left atrial pressure.  2. Right ventricular systolic function is mildly reduced. The right ventricular size is mildly enlarged. There is normal pulmonary artery systolic pressure. The estimated right ventricular systolic pressure is 34.8 mmHg.  3. Left atrial size was mild to  moderately dilated.  4. Right atrial size was mildly dilated.  5. The mitral valve is abnormally thickened with restricted leaflet motion. The mitral valve leaflets appear to attach to one papillary muscle consistent with parachute mitral valve. There is mild to moderate mitral stenosis with MV mean gradient at HR 105bpm. The mitral valve is abnormal. Mild mitral valve regurgitation. The mean mitral valve gradient is 8.0 mmHg.  6. The aortic valve is tricuspid. There is mild calcification of the aortic valve. There is mild thickening of the aortic valve. Aortic valve regurgitation is trivial. Aortic valve sclerosis/calcification is present, without any evidence of aortic stenosis.  7. The inferior vena cava is normal in size with greater than 50% respiratory variability, suggesting right atrial pressure of 3 mmHg. Comparison(s): No significant change from prior study. FINDINGS  Left Ventricle: Left ventricular ejection fraction, by estimation, is 25 to 30%. The left ventricle has severely decreased function. The left ventricle has no regional wall motion abnormalities. Definity contrast agent was given IV to delineate the left  ventricular endocardial borders. The left ventricular internal cavity size was moderately dilated. There is no left ventricular hypertrophy. Left ventricular diastolic parameters are indeterminate. Elevated left atrial pressure. Right Ventricle: The right ventricular size is mildly enlarged. No increase in right ventricular wall thickness. Right ventricular systolic function is mildly reduced. There is normal pulmonary artery systolic pressure. The tricuspid regurgitant velocity  is 2.82 m/s, and with an assumed right atrial pressure of 3 mmHg, the estimated right ventricular systolic pressure is 34.8 mmHg. Left Atrium: Left atrial size was mild to moderately dilated. Right Atrium: Right atrial size was mildly dilated. Pericardium: There is  no evidence of pericardial effusion. Mitral  Valve: The mitral valve is abnormally thickened with restricted leaflet motion. The mitral valve leaflets appear to attach to one papillary muscle consistent with parachute mitral valve. There is mild to moderate mitral stenosis with MV mean gradient at HR 105bpm. The mitral valve is abnormal. There is moderate thickening of the mitral valve leaflet(s). There is mild calcification of the mitral valve leaflet(s). Mild mitral valve regurgitation. MV peak gradient, 16.5 mmHg. The mean mitral valve gradient is 8.0 mmHg. Tricuspid Valve: The tricuspid valve is normal in structure. Tricuspid valve regurgitation is mild. Aortic Valve: The aortic valve is tricuspid. There is mild calcification of the aortic valve. There is mild thickening of the aortic valve. Aortic valve regurgitation is trivial. Aortic valve sclerosis/calcification is present, without any evidence of aortic stenosis. Pulmonic Valve: The pulmonic valve was normal in structure. Pulmonic valve regurgitation is trivial. Aorta: The aortic root is normal in size and structure. Venous: The inferior vena cava is normal in size with greater than 50% respiratory variability, suggesting right atrial pressure of 3 mmHg. IAS/Shunts: The atrial septum is grossly normal.  LEFT VENTRICLE PLAX 2D LVIDd:         5.40 cm      Diastology LVIDs:         4.70 cm      LV e' medial:    3.60 cm/s LV PW:         1.70 cm      LV E/e' medial:  52.1 LV IVS:        1.10 cm      LV e' lateral:   2.18 cm/s LVOT diam:     2.40 cm      LV E/e' lateral: 86.0 LV SV:         65 LV SV Index:   36 LVOT Area:     4.52 cm  LV Volumes (MOD) LV vol d, MOD A2C: 148.0 ml LV vol d, MOD A4C: 160.0 ml LV vol s, MOD A2C: 82.4 ml LV vol s, MOD A4C: 125.0 ml LV SV MOD A2C:     65.6 ml LV SV MOD A4C:     160.0 ml LV SV MOD BP:      45.9 ml RIGHT VENTRICLE             IVC RV S prime:     10.20 cm/s  IVC diam: 1.90 cm TAPSE (M-mode): 1.9 cm LEFT ATRIUM             Index        RIGHT ATRIUM            Index LA diam:        4.90 cm 2.72 cm/m   RA Area:     18.60 cm LA Vol (A2C):   47.8 ml 26.50 ml/m  RA Volume:   55.90 ml  30.99 ml/m LA Vol (A4C):   65.1 ml 36.10 ml/m LA Biplane Vol: 58.2 ml 32.27 ml/m  AORTIC VALVE             PULMONIC VALVE LVOT Vmax:   110.00 cm/s PR End Diast Vel: 1.68 msec LVOT Vmean:  69.400 cm/s LVOT VTI:    0.143 m  AORTA Ao Root diam: 3.10 cm Ao Asc diam:  2.70 cm MITRAL VALVE                TRICUSPID VALVE MV Area (PHT): 5.27 cm  TR Peak grad:   31.8 mmHg MV Area VTI:   1.85 cm     TR Vmax:        282.00 cm/s MV Peak grad:  16.5 mmHg MV Mean grad:  8.0 mmHg     SHUNTS MV Vmax:       2.03 m/s     Systemic VTI:  0.14 m MV Vmean:      126.0 cm/s   Systemic Diam: 2.40 cm MV Decel Time: 144 msec MV E velocity: 187.50 cm/s MV A velocity: 144.00 cm/s MV E/A ratio:  1.30 Laurance Flatten MD Electronically signed by Laurance Flatten MD Signature Date/Time: 11/29/2022/2:32:41 PM    Final    DG Chest 2 View  Result Date: 11/28/2022 CLINICAL DATA:  Left chest pain with shortness of breath. Rhonchi in the upper and lower left lung. Wheezing on the right. EXAM: CHEST - 2 VIEW COMPARISON:  08/03/2022 FINDINGS: Cardiac enlargement. No vascular congestion. Consolidation demonstrated in the left lower lung likely representing pneumonia. Follow-up to resolution is recommended to exclude central obstructing process. Right lung is clear. No pleural effusions. No pneumothorax. Mediastinal contours appear intact. Old right rib fracture. IMPRESSION: 1. Consolidation in the left lower lung likely representing pneumonia. 2. Mild cardiac enlargement. No vascular congestion or edema demonstrated. Electronically Signed   By: Burman Nieves M.D.   On: 11/28/2022 12:31     Scheduled Meds:  apixaban  5 mg Oral BID   atorvastatin  40 mg Oral Daily   carvedilol  3.125 mg Oral BID   clopidogrel  75 mg Oral Daily   docusate sodium  100 mg Oral BID   empagliflozin  10 mg Oral Daily   feeding  supplement  237 mL Oral BID BM   folic acid  1 mg Oral Daily   furosemide  40 mg Intravenous BID   isosorbide-hydrALAZINE  0.5 tablet Oral TID   lidocaine  1 patch Transdermal Q24H   multivitamin with minerals  1 tablet Oral Daily   nicotine  21 mg Transdermal Daily   sodium chloride flush  3 mL Intravenous Q12H   spironolactone  12.5 mg Oral Daily   thiamine  100 mg Oral Daily   Or   thiamine  100 mg Intravenous Daily   Continuous Infusions:  ampicillin-sulbactam (UNASYN) IV 3 g (11/30/22 1026)     LOS: 1 day    Time spent:    Zannie Cove, MD [facility]   11/30/2022, 12:05 PM

## 2022-12-01 DIAGNOSIS — Z91148 Patient's other noncompliance with medication regimen for other reason: Secondary | ICD-10-CM | POA: Diagnosis not present

## 2022-12-01 DIAGNOSIS — I5043 Acute on chronic combined systolic (congestive) and diastolic (congestive) heart failure: Secondary | ICD-10-CM | POA: Diagnosis not present

## 2022-12-01 DIAGNOSIS — Z5900 Homelessness unspecified: Secondary | ICD-10-CM | POA: Diagnosis not present

## 2022-12-01 LAB — CBC
HCT: 36.4 % — ABNORMAL LOW (ref 39.0–52.0)
Hemoglobin: 12.1 g/dL — ABNORMAL LOW (ref 13.0–17.0)
MCH: 30.2 pg (ref 26.0–34.0)
MCHC: 33.2 g/dL (ref 30.0–36.0)
MCV: 90.8 fL (ref 80.0–100.0)
Platelets: 279 10*3/uL (ref 150–400)
RBC: 4.01 MIL/uL — ABNORMAL LOW (ref 4.22–5.81)
RDW: 13.8 % (ref 11.5–15.5)
WBC: 13.4 10*3/uL — ABNORMAL HIGH (ref 4.0–10.5)
nRBC: 0 % (ref 0.0–0.2)

## 2022-12-01 LAB — BASIC METABOLIC PANEL
Anion gap: 11 (ref 5–15)
BUN: 26 mg/dL — ABNORMAL HIGH (ref 6–20)
CO2: 28 mmol/L (ref 22–32)
Calcium: 8.4 mg/dL — ABNORMAL LOW (ref 8.9–10.3)
Chloride: 95 mmol/L — ABNORMAL LOW (ref 98–111)
Creatinine, Ser: 1.93 mg/dL — ABNORMAL HIGH (ref 0.61–1.24)
GFR, Estimated: 42 mL/min — ABNORMAL LOW (ref >60)
Glucose, Bld: 100 mg/dL — ABNORMAL HIGH (ref 70–99)
Potassium: 3.7 mmol/L (ref 3.5–5.1)
Sodium: 134 mmol/L — ABNORMAL LOW (ref 135–145)

## 2022-12-01 LAB — CULTURE, BLOOD (ROUTINE X 2)

## 2022-12-01 MED ORDER — IPRATROPIUM-ALBUTEROL 0.5-2.5 (3) MG/3ML IN SOLN
3.0000 mL | Freq: Three times a day (TID) | RESPIRATORY_TRACT | Status: DC
  Administered 2022-12-01 – 2022-12-03 (×6): 3 mL via RESPIRATORY_TRACT
  Filled 2022-12-01 (×6): qty 3

## 2022-12-01 NOTE — Progress Notes (Addendum)
PROGRESS NOTE    Craig Schmidt  ZOX:[id number] DOB: Aug 05, 1970 DOA: 11/28/2022 PCP: Rema Fendt, NP  52/M w/ tobacco abuse, COPD/asthma, CAD, CVA, chronic combined CHF (ER 30-35% and grade 2 DD in 03/2022), and history of vestibular schwannoma presenting with CP/SOB.  He reports not having access to medications for at least the last 4 months.  Complains of chronic back pain, chest pain, shortness of breath X 1 to 2 days  In the ER troponin 175, WBC 10, creatinine 1.7, chest x-ray with consolidation of left lower lobe,  Subjective: -Feels weak, breathing is a little better, continues to have left-sided chest pain  Assessment and Plan:  Acute on chronic combined CHF History of moderate mitral stenosis -Known mixed cardiomyopathy, EF 30% in 9/23 -Repeat echo with EF 25%, mildly reduced RV  -Noncompliant with medications, complicated by homelessness and substance abuse -Cards following, diuresing on IV Lasix, he is 5.7 L negative, continue IV Lasix 1 more day, Jardiance, low-dose Aldactone -Continue Coreg and BiDil -PT OT eval, likely difficult placement per TOC  Aspiration pneumonia -Likely in the background of EtOH/substance abuse, also has a history of CVA -Continue IV Unasyn, day 3, add DuoNeb's, pulmonary toilet -SLP eval  AKI/CKD 3b -Baseline creatinine around 1.6-2 -Relatively stable, monitor on diuretics  History of CAD Demand ischemia -Troponins mildly elevated 175, 267, 311 -Cards following, Rx w/ IV heparin, now off -Continue Coreg, BiDil, statin  Lethargy, mild encephalopathy -Multifactorial, improving, ammonia level was normal  History 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 -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, supposed to be on Eliquis -Resumed Eliquis  History of vestibular schwannoma -Unclear when this was diagnosed or whether he's had any workup for it  Polysubstance abuse Cocaine, alcohol abuse -UDS positive for cocaine, counseled -Social work  consult appreciated  Noncompliance Homelessness   DVT prophylaxis: IV heparin Code Status: Full code Family Communication: No family at bedside yesterday Disposition Plan: SNF  Consultants:    Procedures:   Antimicrobials:    Objective: Vitals:   11/30/22 1955 11/30/22 2345 12/01/22 0418 12/01/22 0816  BP:  (!) 116/57 99/63 128/87  Pulse:  (!) 104 (!) 102 100  Resp:  18 18 18   Temp:  98.7 F (37.1 C) 98.5 F (36.9 C) 98.8 F (37.1 C)  TempSrc:  Oral Oral Oral  SpO2: 97% 95% 98% 96%  Weight:   71 kg   Height:        Intake/Output Summary (Last 24 hours) at 12/01/2022 1058 Last data filed at 12/01/2022 0930 Gross per 24 hour  Intake 833.83 ml  Output 2800 ml  Net -1966.17 ml   Filed Weights   11/29/22 0341 11/30/22 0325 12/01/22 0418  Weight: 69.3 kg 67 kg 71 kg    Examination:  General exam: Chronically ill male sitting up in bed, AAO x 2, uncomfortable appearing HEENT: Positive JVD CVS: S1-S2, regular rhythm, tachycardic Lungs: Decreased breath sounds on the left, few scattered rhonchi Abdomen: Soft, nontender, bowel sounds present Extremities: 1+ edema  Data Reviewed:   CBC: Recent Labs  Lab 11/28/22 1155 11/29/22 1032 11/30/22 0931 12/01/22 0053  WBC 10.6* 20.5* 16.6* 13.4*  NEUTROABS 8.4*  --   --   --   HGB 10.7* 12.3* 13.2 12.1*  HCT 32.6* 37.0* 40.0 36.4*  MCV 95.3 92.7 93.5 90.8  PLT 189 218 245 279   Basic Metabolic Panel: Recent Labs  Lab 11/28/22 1155 11/30/22 0931 12/01/22 0053  NA 134* 132* 134*  K 3.5  3.7 3.7  CL 106 90* 95*  CO2 18* 32 28  GLUCOSE 107* 100* 100*  BUN 17 21* 26*  CREATININE 1.69* 1.77* 1.93*  CALCIUM 7.6* 8.6* 8.4*   GFR: Estimated Creatinine Clearance: 42.8 mL/min (A) (by C-G formula based on SCr of 1.93 mg/dL (H)). Liver Function Tests: Recent Labs  Lab 11/28/22 1155 11/30/22 0931  AST 35 17  ALT 14 12  ALKPHOS 81 83  BILITOT 1.0 1.1  PROT 5.0* 6.1*  ALBUMIN 2.3* 2.2*   No results for  input(s): "LIPASE", "AMYLASE" in the last 168 hours. Recent Labs  Lab 11/29/22 1027  AMMONIA 32   Coagulation Profile: No results for input(s): "INR", "PROTIME" in the last 168 hours. Cardiac Enzymes: No results for input(s): "CKTOTAL", "CKMB", "CKMBINDEX", "TROPONINI" in the last 168 hours. BNP (last 3 results) No results for input(s): "PROBNP" in the last 8760 hours. HbA1C: Recent Labs    11/28/22 1743  HGBA1C 5.4   CBG: No results for input(s): "GLUCAP" in the last 168 hours. Lipid Profile: Recent Labs    11/28/22 1743  CHOL 118  HDL 27*  LDLCALC 79  TRIG 60  CHOLHDL 4.4   Thyroid Function Tests: Recent Labs    11/28/22 1743  TSH 1.196   Anemia Panel: No results for input(s): "VITAMINB12", "FOLATE", "FERRITIN", "TIBC", "IRON", "RETICCTPCT" in the last 72 hours. Urine analysis:    Component Value Date/Time   COLORURINE YELLOW 07/17/2022 1622   APPEARANCEUR HAZY (A) 07/17/2022 1622   LABSPEC 1.023 07/17/2022 1622   PHURINE 5.0 07/17/2022 1622   GLUCOSEU >=500 (A) 07/17/2022 1622   HGBUR NEGATIVE 07/17/2022 1622   BILIRUBINUR NEGATIVE 07/17/2022 1622   KETONESUR NEGATIVE 07/17/2022 1622   PROTEINUR 100 (A) 07/17/2022 1622   UROBILINOGEN 0.2 05/14/2015 0845   NITRITE NEGATIVE 07/17/2022 1622   LEUKOCYTESUR NEGATIVE 07/17/2022 1622   Sepsis Labs: @LABRCNTIP (procalcitonin:4,lacticidven:4)  ) Recent Results (from the past 240 hour(s))  Culture, blood (Routine X 2) w Reflex to ID Panel     Status: None (Preliminary result)   Collection Time: 11/29/22  3:01 PM   Specimen: BLOOD LEFT HAND  Result Value Ref Range Status   Specimen Description BLOOD LEFT HAND  Final   Special Requests   Final    BOTTLES DRAWN AEROBIC ONLY Blood Culture results may not be optimal due to an inadequate volume of blood received in culture bottles   Culture   Final    NO GROWTH < 24 HOURS Performed at [facility], [address] [place], [place] [address]    Report  Status PENDING  Incomplete  Culture, blood (Routine X 2) w Reflex to ID Panel     Status: None (Preliminary result)   Collection Time: 11/29/22  3:03 PM   Specimen: BLOOD LEFT HAND  Result Value Ref Range Status   Specimen Description BLOOD LEFT HAND  Final   Special Requests   Final    BOTTLES DRAWN AEROBIC ONLY Blood Culture results may not be optimal due to an inadequate volume of blood received in culture bottles   Culture   Final    NO GROWTH < 24 HOURS Performed at [facility], [address], [place], [place] [address]    Report Status PENDING  Incomplete     Radiology Studies: No results found.   Scheduled Meds:  apixaban  5 mg Oral BID   atorvastatin  40 mg Oral Daily   carvedilol  3.125 mg Oral BID  clopidogrel  75 mg Oral Daily   docusate sodium  100 mg Oral BID   empagliflozin  10 mg Oral Daily   feeding supplement  237 mL Oral BID BM   folic acid  1 mg Oral Daily   furosemide  40 mg Intravenous BID   ipratropium-albuterol  3 mL Nebulization QID   isosorbide-hydrALAZINE  0.5 tablet Oral TID   lidocaine  1 patch Transdermal Q24H   multivitamin with minerals  1 tablet Oral Daily   nicotine  21 mg Transdermal Daily   sodium chloride flush  3 mL Intravenous Q12H   spironolactone  12.5 mg Oral Daily   thiamine  100 mg Oral Daily   Or   thiamine  100 mg Intravenous Daily   Continuous Infusions:  ampicillin-sulbactam (UNASYN) IV 3 g (12/01/22 0930)     LOS: 2 days    Time spent:    Zannie Cove, MD [facility]   12/01/2022, 10:58 AM

## 2022-12-01 NOTE — Progress Notes (Signed)
Rounding Note    Patient Name: Craig Schmidt Date of Encounter: 12/01/2022  [facility] Cardiologist: [name], MD   Subjective   Notes having some rib pain   Inpatient Medications    Scheduled Meds:  apixaban  5 mg Oral BID   atorvastatin  40 mg Oral Daily   carvedilol  3.125 mg Oral BID   clopidogrel  75 mg Oral Daily   docusate sodium  100 mg Oral BID   empagliflozin  10 mg Oral Daily   feeding supplement  237 mL Oral BID BM   folic acid  1 mg Oral Daily   furosemide  40 mg Intravenous BID   ipratropium-albuterol  3 mL Nebulization QID   isosorbide-hydrALAZINE  0.5 tablet Oral TID   lidocaine  1 patch Transdermal Q24H   multivitamin with minerals  1 tablet Oral Daily   nicotine  21 mg Transdermal Daily   sodium chloride flush  3 mL Intravenous Q12H   spironolactone  12.5 mg Oral Daily   thiamine  100 mg Oral Daily   Or   thiamine  100 mg Intravenous Daily   Continuous Infusions:  ampicillin-sulbactam (UNASYN) IV 3 g (12/01/22 0930)   PRN Meds: acetaminophen **OR** acetaminophen, albuterol, bisacodyl, hydrALAZINE, hydrOXYzine, loperamide, methocarbamol, morphine injection, ondansetron **OR** ondansetron (ZOFRAN) IV, polyethylene glycol, polyvinyl alcohol   Vital Signs    Vitals:   11/30/22 1955 11/30/22 2345 12/01/22 0418 12/01/22 0816  BP:  (!) 116/57 99/63 128/87  Pulse:  (!) 104 (!) 102 100  Resp:  18 18 18   Temp:  98.7 F (37.1 C) 98.5 F (36.9 C) 98.8 F (37.1 C)  TempSrc:  Oral Oral Oral  SpO2: 97% 95% 98% 96%  Weight:   71 kg   Height:        Intake/Output Summary (Last 24 hours) at 12/01/2022 1046 Last data filed at 12/01/2022 0930 Gross per 24 hour  Intake 833.83 ml  Output 2800 ml  Net -1966.17 ml      12/01/2022    4:18 AM 11/30/2022    3:25 AM 11/29/2022    3:41 AM  Last 3 Weights  Weight (lbs) 156 lb 8.4 oz 147 lb 11.3 oz 152 lb 12.5 oz  Weight (kg) 71 kg 67 kg 69.3 kg      Telemetry    Sinus rhythm, sinus  tachycardia - Personally Reviewed  ECG    No new - Personally Reviewed  Physical Exam   Vitals:   12/01/22 0418 12/01/22 0816  BP: 99/63 128/87  Pulse: (!) 102 100  Resp: 18 18  Temp: 98.5 F (36.9 C) 98.8 F (37.1 C)  SpO2: 98% 96%    GEN: No acute distress.   Neck: + JVD Cardiac: RRR, no murmurs, rubs, or gallops.  Respiratory: nl wob, decreased BS GI: Soft, nontender, non-distended  MS: No edema; No deformity. Neuro:  Nonfocal  Psych: Normal affect   Labs    High Sensitivity Troponin:   Recent Labs  Lab 11/28/22 1348 11/28/22 1743 11/28/22 2238  TROPONINIHS 175* 311* 267*     Chemistry Recent Labs  Lab 11/28/22 1155 11/30/22 0931 12/01/22 0053  NA 134* 132* 134*  K 3.5 3.7 3.7  CL 106 90* 95*  CO2 18* 32 28  GLUCOSE 107* 100* 100*  BUN 17 21* 26*  CREATININE 1.69* 1.77* 1.93*  CALCIUM 7.6* 8.6* 8.4*  PROT 5.0* 6.1*  --   ALBUMIN 2.3* 2.2*  --  AST 35 17  --   ALT 14 12  --   ALKPHOS 81 83  --   BILITOT 1.0 1.1  --   GFRNONAA 49* 46* 42*  ANIONGAP 10 10 11     Lipids  Recent Labs  Lab 11/28/22 1743  CHOL 118  TRIG 60  HDL 27*  LDLCALC 79  CHOLHDL 4.4    Hematology Recent Labs  Lab 11/29/22 1032 11/30/22 0931 12/01/22 0053  WBC 20.5* 16.6* 13.4*  RBC 3.99* 4.28 4.01*  HGB 12.3* 13.2 12.1*  HCT 37.0* 40.0 36.4*  MCV 92.7 93.5 90.8  MCH 30.8 30.8 30.2  MCHC 33.2 33.0 33.2  RDW 13.5 13.7 13.8  PLT 218 245 279   Thyroid  Recent Labs  Lab 11/28/22 1743  TSH 1.196    BNP Recent Labs  Lab 11/28/22 1152  BNP 2,810.5*    DDimer No results for input(s): "DDIMER" in the last 168 hours.   Radiology    No results found.  Cardiac Studies   Principal Problem:   Acute on chronic combined systolic (congestive) and diastolic (congestive) heart failure (HCC) Active Problems:   Homeless   Cocaine abuse (HCC)   Smoker   Alcohol abuse   ACS (acute coronary syndrome) (HCC)   Back pain   Essential hypertension    Dyslipidemia   Noncompliance with medication regimen   Acute on chronic combined systolic and diastolic CHF (congestive heart failure) (HCC)  Patient Profile     Craig Schmidt is a 52 y.o. 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 Ophelia Charter   Assessment & Plan    Can continue diuresis today still has JVD. Crt stable. BNP 2,810 on 5/8. On low dose coreg and Bidil, atorvastatin (LDL 79). Vitals stable. UDS +for cocaine. Complex social situation- appreciate SW and case manager assistance.   For questions or updates, please contact [facility] Please consult [email or web address] for contact info under        Signed, [name], MD  12/01/2022, 10:46 AM

## 2022-12-02 ENCOUNTER — Inpatient Hospital Stay (HOSPITAL_COMMUNITY): Payer: Medicaid Other

## 2022-12-02 DIAGNOSIS — I5043 Acute on chronic combined systolic (congestive) and diastolic (congestive) heart failure: Secondary | ICD-10-CM | POA: Diagnosis not present

## 2022-12-02 DIAGNOSIS — Z5900 Homelessness unspecified: Secondary | ICD-10-CM | POA: Diagnosis not present

## 2022-12-02 DIAGNOSIS — Z91148 Patient's other noncompliance with medication regimen for other reason: Secondary | ICD-10-CM | POA: Diagnosis not present

## 2022-12-02 LAB — CBC
HCT: 39.1 % (ref 39.0–52.0)
Hemoglobin: 13.1 g/dL (ref 13.0–17.0)
MCH: 30.4 pg (ref 26.0–34.0)
MCHC: 33.5 g/dL (ref 30.0–36.0)
MCV: 90.7 fL (ref 80.0–100.0)
Platelets: 383 10*3/uL (ref 150–400)
RBC: 4.31 MIL/uL (ref 4.22–5.81)
RDW: 13.5 % (ref 11.5–15.5)
WBC: 11.1 10*3/uL — ABNORMAL HIGH (ref 4.0–10.5)
nRBC: 0 % (ref 0.0–0.2)

## 2022-12-02 LAB — BASIC METABOLIC PANEL
Anion gap: 13 (ref 5–15)
BUN: 29 mg/dL — ABNORMAL HIGH (ref 6–20)
CO2: 28 mmol/L (ref 22–32)
Calcium: 8.7 mg/dL — ABNORMAL LOW (ref 8.9–10.3)
Chloride: 91 mmol/L — ABNORMAL LOW (ref 98–111)
Creatinine, Ser: 1.95 mg/dL — ABNORMAL HIGH (ref 0.61–1.24)
GFR, Estimated: 41 mL/min — ABNORMAL LOW (ref >60)
Glucose, Bld: 118 mg/dL — ABNORMAL HIGH (ref 70–99)
Potassium: 4.1 mmol/L (ref 3.5–5.1)
Sodium: 132 mmol/L — ABNORMAL LOW (ref 135–145)

## 2022-12-02 LAB — CULTURE, BLOOD (ROUTINE X 2)

## 2022-12-02 NOTE — Progress Notes (Addendum)
PROGRESS NOTE    Craig Schmidt  ZOX:[id number] DOB: [birth year] DOA: 11/28/2022 PCP: Rema Fendt, NP  52/M w/ tobacco abuse, COPD/asthma, CAD, CVA, chronic combined CHF (ER 30-35% and grade 2 DD in 03/2022), and history of vestibular schwannoma presenting with CP/SOB.  He reports not having access to medications for at least the last 4 months.  Complains of chronic back pain, chest pain, shortness of breath X 1 to 2 days  In the ER troponin 175, WBC 10, creatinine 1.7, chest x-ray with consolidation of left lower lobe, -Admitted, slowly improving on antibiotics and diuretics  Subjective: -Continues to have left-sided pain with deep inspiration, some cough, overall breathing better  Assessment and Plan:  Acute on chronic combined CHF History of moderate mitral stenosis -Known mixed cardiomyopathy, EF 30% in 9/23 -Repeat echo with EF 25%, mildly reduced RV  -Noncompliant with medications, complicated by homelessness and substance abuse -Cards following,, diuresing well on IV Lasix he is 8.1 L negative, continue IV Lasix 1 more day, continue Jardiance, Aldactone  -Continue Coreg and BiDil -PT OT eval, increase activity, likely difficult placement per TOC  Aspiration pneumonia -Likely in the background of EtOH/substance abuse, also has a history of CVA -Continue IV Unasyn, day 4, add DuoNeb's, pulmonary toilet -SLP eval was unremarkable -Repeat chest x-ray  AKI/CKD 3b -Baseline creatinine around 1.6-2 -Relatively stable, monitor on diuretics  History of CAD Demand ischemia -Troponins mildly elevated 175, 267, 311 -Cards following, Rx w/ IV heparin, now off -Continue Coreg, Plavix, BiDil, statin  Lethargy, mild encephalopathy -Multifactorial, improving, ammonia level was normal  History 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 -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, supposed to be on Eliquis -Resumed Eliquis  History of vestibular schwannoma -Unclear when this was diagnosed  or whether he's had any workup for it  Polysubstance abuse Cocaine, alcohol abuse -UDS positive for cocaine, counseled -Social work consult appreciated  Noncompliance Homelessness   DVT prophylaxis: Eliquis Code Status: Full code Family Communication: No family at bedside yesterday Disposition Plan: SNF  Consultants:    Procedures:   Antimicrobials:    Objective: Vitals:   12/02/22 0451 12/02/22 0454 12/02/22 0739 12/02/22 0842  BP: 120/73  136/88   Pulse: 99  99   Resp: 17  18   Temp: 98.4 F (36.9 C)  98 F (36.7 C)   TempSrc: Oral  Oral   SpO2: 99%  99% 99%  Weight:  67.4 kg    Height:        Intake/Output Summary (Last 24 hours) at 12/02/2022 1037 Last data filed at 12/02/2022 0500 Gross per 24 hour  Intake 940 ml  Output 2750 ml  Net -1810 ml   Filed Weights   11/30/22 0325 12/01/22 0418 12/02/22 0454  Weight: 67 kg 71 kg 67.4 kg    Examination:  General exam: Chronically ill male sitting up in bed, AAO x 3, more comfortable today HEENT: No JVD CVS: S1-S2, regular rhythm, tachycardic Lungs:: Decreased breath sounds in the lungs left, few scattered rhonchi Abdomen: Soft, nontender, bowel sounds present Extremities: Trace edema   Data Reviewed:   CBC: Recent Labs  Lab 11/28/22 1155 11/29/22 1032 11/30/22 0931 12/01/22 0053 12/02/22 0033  WBC 10.6* 20.5* 16.6* 13.4* 11.1*  NEUTROABS 8.4*  --   --   --   --   HGB 10.7* 12.3* 13.2 12.1* 13.1  HCT 32.6* 37.0* 40.0 36.4* 39.1  MCV 95.3 92.7 93.5 90.8 90.7  PLT 189 218 245 279 383  Basic Metabolic Panel: Recent Labs  Lab 11/28/22 1155 11/30/22 0931 12/01/22 0053 12/02/22 0033  NA 134* 132* 134* 132*  K 3.5 3.7 3.7 4.1  CL 106 90* 95* 91*  CO2 18* 32 28 28  GLUCOSE 107* 100* 100* 118*  BUN 17 21* 26* 29*  CREATININE 1.69* 1.77* 1.93* 1.95*  CALCIUM 7.6* 8.6* 8.4* 8.7*   GFR: Estimated Creatinine Clearance: 42.4 mL/min (A) (by C-G formula based on SCr of 1.95 mg/dL (H)). Liver  Function Tests: Recent Labs  Lab 11/28/22 1155 11/30/22 0931  AST 35 17  ALT 14 12  ALKPHOS 81 83  BILITOT 1.0 1.1  PROT 5.0* 6.1*  ALBUMIN 2.3* 2.2*   No results for input(s): "LIPASE", "AMYLASE" in the last 168 hours. Recent Labs  Lab 11/29/22 1027  AMMONIA 32   Coagulation Profile: No results for input(s): "INR", "PROTIME" in the last 168 hours. Cardiac Enzymes: No results for input(s): "CKTOTAL", "CKMB", "CKMBINDEX", "TROPONINI" in the last 168 hours. BNP (last 3 results) No results for input(s): "PROBNP" in the last 8760 hours. HbA1C: No results for input(s): "HGBA1C" in the last 72 hours.  CBG: No results for input(s): "GLUCAP" in the last 168 hours. Lipid Profile: No results for input(s): "CHOL", "HDL", "LDLCALC", "TRIG", "CHOLHDL", "LDLDIRECT" in the last 72 hours.  Thyroid Function Tests: No results for input(s): "TSH", "T4TOTAL", "FREET4", "T3FREE", "THYROIDAB" in the last 72 hours.  Anemia Panel: No results for input(s): "VITAMINB12", "FOLATE", "FERRITIN", "TIBC", "IRON", "RETICCTPCT" in the last 72 hours. Urine analysis:    Component Value Date/Time   COLORURINE YELLOW 07/17/2022 1622   APPEARANCEUR HAZY (A) 07/17/2022 1622   LABSPEC 1.023 07/17/2022 1622   PHURINE 5.0 07/17/2022 1622   GLUCOSEU >=500 (A) 07/17/2022 1622   HGBUR NEGATIVE 07/17/2022 1622   BILIRUBINUR NEGATIVE 07/17/2022 1622   KETONESUR NEGATIVE 07/17/2022 1622   PROTEINUR 100 (A) 07/17/2022 1622   UROBILINOGEN 0.2 05/14/2015 0845   NITRITE NEGATIVE 07/17/2022 1622   LEUKOCYTESUR NEGATIVE 07/17/2022 1622   Sepsis Labs: @LABRCNTIP (procalcitonin:4,lacticidven:4)  ) Recent Results (from the past 240 hour(s))  Culture, blood (Routine X 2) w Reflex to ID Panel     Status: None (Preliminary result)   Collection Time: 11/29/22  3:01 PM   Specimen: BLOOD LEFT HAND  Result Value Ref Range Status   Specimen Description BLOOD LEFT HAND  Final   Special Requests   Final    BOTTLES DRAWN  AEROBIC ONLY Blood Culture results may not be optimal due to an inadequate volume of blood received in culture bottles   Culture   Final    NO GROWTH 2 DAYS Performed at [facility], [address], [place], [place] [address]    Report Status PENDING  Incomplete  Culture, blood (Routine X 2) w Reflex to ID Panel     Status: None (Preliminary result)   Collection Time: 11/29/22  3:03 PM   Specimen: BLOOD LEFT HAND  Result Value Ref Range Status   Specimen Description BLOOD LEFT HAND  Final   Special Requests   Final    BOTTLES DRAWN AEROBIC ONLY Blood Culture results may not be optimal due to an inadequate volume of blood received in culture bottles   Culture   Final    NO GROWTH 2 DAYS Performed at [facility], [address] [place], [place] [address]    Report Status PENDING  Incomplete     Radiology Studies: DG Chest 2 View  Result Date: 12/02/2022  CLINICAL DATA:  Pneumonia EXAM: CHEST - 2 VIEW COMPARISON:  Nov 28, 2022 FINDINGS: Left lower lobe consolidation is similar in the interval. No other interval changes. IMPRESSION: Persistent left lower lobe pneumonia. Electronically Signed   By: Gerome Sam III M.D.   On: 12/02/2022 10:31     Scheduled Meds:  apixaban  5 mg Oral BID   atorvastatin  40 mg Oral Daily   carvedilol  3.125 mg Oral BID   clopidogrel  75 mg Oral Daily   docusate sodium  100 mg Oral BID   empagliflozin  10 mg Oral Daily   feeding supplement  237 mL Oral BID BM   folic acid  1 mg Oral Daily   furosemide  40 mg Intravenous BID   ipratropium-albuterol  3 mL Nebulization TID   isosorbide-hydrALAZINE  0.5 tablet Oral TID   lidocaine  1 patch Transdermal Q24H   multivitamin with minerals  1 tablet Oral Daily   nicotine  21 mg Transdermal Daily   sodium chloride flush  3 mL Intravenous Q12H   spironolactone  12.5 mg Oral Daily   thiamine  100 mg Oral Daily   Or   thiamine  100 mg Intravenous Daily   Continuous Infusions:   ampicillin-sulbactam (UNASYN) IV 3 g (12/02/22 0824)     LOS: 3 days    Time spent:    Zannie Cove, MD [facility]   12/02/2022, 10:37 AM

## 2022-12-02 NOTE — Progress Notes (Signed)
Rounding Note    Patient Name: Craig Schmidt Date of Encounter: 12/02/2022  [facility] Cardiologist: [name], MD   Subjective   No issues today  Inpatient Medications    Scheduled Meds:  apixaban  5 mg Oral BID   atorvastatin  40 mg Oral Daily   carvedilol  3.125 mg Oral BID   clopidogrel  75 mg Oral Daily   docusate sodium  100 mg Oral BID   empagliflozin  10 mg Oral Daily   feeding supplement  237 mL Oral BID BM   folic acid  1 mg Oral Daily   furosemide  40 mg Intravenous BID   ipratropium-albuterol  3 mL Nebulization TID   isosorbide-hydrALAZINE  0.5 tablet Oral TID   lidocaine  1 patch Transdermal Q24H   multivitamin with minerals  1 tablet Oral Daily   nicotine  21 mg Transdermal Daily   sodium chloride flush  3 mL Intravenous Q12H   spironolactone  12.5 mg Oral Daily   thiamine  100 mg Oral Daily   Or   thiamine  100 mg Intravenous Daily   Continuous Infusions:  ampicillin-sulbactam (UNASYN) IV 3 g (12/02/22 0824)   PRN Meds: acetaminophen **OR** acetaminophen, albuterol, bisacodyl, hydrALAZINE, hydrOXYzine, loperamide, methocarbamol, morphine injection, ondansetron **OR** ondansetron (ZOFRAN) IV, polyethylene glycol, polyvinyl alcohol   Vital Signs    Vitals:   12/02/22 0451 12/02/22 0454 12/02/22 0739 12/02/22 0842  BP: 120/73  136/88   Pulse: 99  99   Resp: 17  18   Temp: 98.4 F (36.9 C)  98 F (36.7 C)   TempSrc: Oral  Oral   SpO2: 99%  99% 99%  Weight:  67.4 kg    Height:        Intake/Output Summary (Last 24 hours) at 12/02/2022 0959 Last data filed at 12/02/2022 0500 Gross per 24 hour  Intake 940 ml  Output 2750 ml  Net -1810 ml      12/02/2022    4:54 AM 12/01/2022    4:18 AM 11/30/2022    3:25 AM  Last 3 Weights  Weight (lbs) 148 lb 9.4 oz 156 lb 8.4 oz 147 lb 11.3 oz  Weight (kg) 67.4 kg 71 kg 67 kg      Telemetry    Sinus rhythm, sinus tachycardia, infrequent PVCs - Personally Reviewed  ECG    No  new - Personally Reviewed  Physical Exam   Vitals:   12/02/22 0739 12/02/22 0842  BP: 136/88   Pulse: 99   Resp: 18   Temp: 98 F (36.7 C)   SpO2: 99% 99%    GEN: No acute distress.   Neck: challenging to assess, laying curled in bed Cardiac: RRR, no murmurs, rubs, or gallops.  Respiratory: nl wob, mild decreased BS GI: Soft, nontender, non-distended  MS: No piting edema; No deformity. Neuro:  Nonfocal  Psych: Normal affect   Labs    High Sensitivity Troponin:   Recent Labs  Lab 11/28/22 1348 11/28/22 1743 11/28/22 2238  TROPONINIHS 175* 311* 267*     Chemistry Recent Labs  Lab 11/28/22 1155 11/30/22 0931 12/01/22 0053 12/02/22 0033  NA 134* 132* 134* 132*  K 3.5 3.7 3.7 4.1  CL 106 90* 95* 91*  CO2 18* 32 28 28  GLUCOSE 107* 100* 100* 118*  BUN 17 21* 26* 29*  CREATININE 1.69* 1.77* 1.93* 1.95*  CALCIUM 7.6* 8.6* 8.4* 8.7*  PROT 5.0* 6.1*  --   --  ALBUMIN 2.3* 2.2*  --   --   AST 35 17  --   --   ALT 14 12  --   --   ALKPHOS 81 83  --   --   BILITOT 1.0 1.1  --   --   GFRNONAA 49* 46* 42* 41*  ANIONGAP 10 10 11 13     Lipids  Recent Labs  Lab 11/28/22 1743  CHOL 118  TRIG 60  HDL 27*  LDLCALC 79  CHOLHDL 4.4    Hematology Recent Labs  Lab 11/30/22 0931 12/01/22 0053 12/02/22 0033  WBC 16.6* 13.4* 11.1*  RBC 4.28 4.01* 4.31  HGB 13.2 12.1* 13.1  HCT 40.0 36.4* 39.1  MCV 93.5 90.8 90.7  MCH 30.8 30.2 30.4  MCHC 33.0 33.2 33.5  RDW 13.7 13.8 13.5  PLT 245 279 383   Thyroid  Recent Labs  Lab 11/28/22 1743  TSH 1.196    BNP Recent Labs  Lab 11/28/22 1152  BNP 2,810.5*    DDimer No results for input(s): "DDIMER" in the last 168 hours.   Radiology    No results found.  Cardiac Studies   Principal Problem:   Acute on chronic combined systolic (congestive) and diastolic (congestive) heart failure (HCC) Active Problems:   Homeless   Cocaine abuse (HCC)   Smoker   Alcohol abuse   ACS (acute coronary syndrome) (HCC)    Back pain   Essential hypertension   Dyslipidemia   Noncompliance with medication regimen   Acute on chronic combined systolic and diastolic CHF (congestive heart failure) (HCC)  Patient Profile     Craig Schmidt is a 52 y.o. 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 Ophelia Charter   Assessment & Plan    -Can continue lasix 40 mg IV BID; (-) 2.3 L. Crt stable. BNP 2,810 on 5/8. Would aim to have him ambulate and assess his O2. Challenging to gauge his clinical improvement. On low dose coreg and Bidil, atorvastatin (LDL 79). Vitals stable. UDS +for cocaine. Complex social situation- appreciate SW and case manager assistance.    For questions or updates, please contact [facility] Please consult [email or web address] for contact info under        Signed, [name], MD  12/02/2022, 9:59 AM

## 2022-12-02 NOTE — Plan of Care (Signed)

## 2022-12-03 ENCOUNTER — Other Ambulatory Visit: Payer: Medicaid Other | Admitting: *Deleted

## 2022-12-03 DIAGNOSIS — I5042 Chronic combined systolic (congestive) and diastolic (congestive) heart failure: Secondary | ICD-10-CM

## 2022-12-03 DIAGNOSIS — Z5900 Homelessness unspecified: Secondary | ICD-10-CM | POA: Diagnosis not present

## 2022-12-03 DIAGNOSIS — I5043 Acute on chronic combined systolic (congestive) and diastolic (congestive) heart failure: Secondary | ICD-10-CM | POA: Diagnosis not present

## 2022-12-03 DIAGNOSIS — Z91148 Patient's other noncompliance with medication regimen for other reason: Secondary | ICD-10-CM | POA: Diagnosis not present

## 2022-12-03 LAB — CBC
HCT: 41.6 % (ref 39.0–52.0)
Hemoglobin: 13.7 g/dL (ref 13.0–17.0)
MCH: 30.3 pg (ref 26.0–34.0)
MCHC: 32.9 g/dL (ref 30.0–36.0)
MCV: 92 fL (ref 80.0–100.0)
Platelets: 449 10*3/uL — ABNORMAL HIGH (ref 150–400)
RBC: 4.52 MIL/uL (ref 4.22–5.81)
RDW: 13.6 % (ref 11.5–15.5)
WBC: 10.6 10*3/uL — ABNORMAL HIGH (ref 4.0–10.5)
nRBC: 0 % (ref 0.0–0.2)

## 2022-12-03 LAB — BASIC METABOLIC PANEL
Anion gap: 12 (ref 5–15)
BUN: 32 mg/dL — ABNORMAL HIGH (ref 6–20)
CO2: 25 mmol/L (ref 22–32)
Calcium: 8.7 mg/dL — ABNORMAL LOW (ref 8.9–10.3)
Chloride: 92 mmol/L — ABNORMAL LOW (ref 98–111)
Creatinine, Ser: 2.16 mg/dL — ABNORMAL HIGH (ref 0.61–1.24)
GFR, Estimated: 36 mL/min — ABNORMAL LOW (ref >60)
Glucose, Bld: 193 mg/dL — ABNORMAL HIGH (ref 70–99)
Potassium: 3.8 mmol/L (ref 3.5–5.1)
Sodium: 129 mmol/L — ABNORMAL LOW (ref 135–145)

## 2022-12-03 LAB — CULTURE, BLOOD (ROUTINE X 2)

## 2022-12-03 MED ORDER — AMOXICILLIN-POT CLAVULANATE 875-125 MG PO TABS
1.0000 | ORAL_TABLET | Freq: Two times a day (BID) | ORAL | Status: DC
  Administered 2022-12-03 – 2022-12-05 (×5): 1 via ORAL
  Filled 2022-12-03 (×5): qty 1

## 2022-12-03 NOTE — Progress Notes (Signed)
Occupational Therapy Treatment Patient Details Name: Craig Schmidt MRN: [id number] DOB: 1971-05-13 Today's Date: 12/03/2022   History of present illness Pt is a 52 year old male admitted on 11/28/22 for chest pain and SOB. Past medical history significant of asthma, CAD, CVA, chronic combined CHF (ER 30-35% and grade 2 DD in 03/2022), and brain tumor   OT comments  Pt performing much better in today's OT session, no longer expressing pain with functional mobility. Pt completing OOB mobility with Supervision with and without the use of Rollator. Educated pt on the proper use of Rollator as well as reinforced CHF signs and prevention. Discussed with pt that it is a challenge due to current living situation but encouraged pt to be mindful of eating decisions. Pt still has RUE deficits from prior CVA back in march, Pt would benefit from continued acute skilled OT to address RUE deficits and help transition to next level of care. Pt would be most appropriate for Outpatient OT.    Recommendations for follow up therapy are one component of a multi-disciplinary discharge planning process, led by the attending physician.  Recommendations may be updated based on patient status, additional functional criteria and insurance authorization.    Assistance Recommended at Discharge Frequent or constant Supervision/Assistance  Patient can return home with the following  A lot of help with bathing/dressing/bathroom;Assistance with cooking/housework;Assist for transportation   Equipment Recommendations  None recommended by OT    Recommendations for Other Services      Precautions / Restrictions Precautions Precautions: Fall Restrictions Weight Bearing Restrictions: No       Mobility Bed Mobility Overal bed mobility: Modified Independent                  Transfers Overall transfer level: Needs assistance Equipment used: Rollator (4 wheels), None Transfers: Sit to/from Stand Sit to Stand:  Modified independent (Device/Increase time)           General transfer comment: Pt educated on the use of Rollator to complete functional transfers     Balance Overall balance assessment: Needs assistance Sitting-balance support: Feet supported Sitting balance-Leahy Scale: Good     Standing balance support: Bilateral upper extremity supported, During functional activity Standing balance-Leahy Scale: Good Standing balance comment: intially walked in hall no AD                           ADL either performed or assessed with clinical judgement   ADL Overall ADL's : Needs assistance/impaired                     Lower Body Dressing: Maximal assistance Lower Body Dressing Details (indicate cue type and reason): pt able to doff L sock independently but needs Max A to don L sock due to RUE deficits             Functional mobility during ADLs: Supervision/safety;Rollator (4 wheels)      Extremity/Trunk Assessment              Vision       Perception     Praxis      Cognition Arousal/Alertness: Awake/alert Behavior During Therapy: WFL for tasks assessed/performed Overall Cognitive Status: Within Functional Limits for tasks assessed  Exercises      Shoulder Instructions       General Comments VSS on RA    Pertinent Vitals/ Pain       Pain Assessment Pain Assessment: No/denies pain  Home Living                                          Prior Functioning/Environment              Frequency  Min 2X/week        Progress Toward Goals  OT Goals(current goals can now be found in the care plan section)  Progress towards OT goals: Progressing toward goals  Acute Rehab OT Goals Patient Stated Goal: none stated OT Goal Formulation: With patient Time For Goal Achievement: 12/13/22 Potential to Achieve Goals: Good  Plan Frequency remains  appropriate;Discharge plan needs to be updated    Co-evaluation    PT/OT/SLP Co-Evaluation/Treatment: Yes Reason for Co-Treatment: Complexity of the patient's impairments (multi-system involvement) PT goals addressed during session: Mobility/safety with mobility;Proper use of DME OT goals addressed during session: ADL's and self-care;Other (comment) (CHF education)      AM-PAC OT "6 Clicks" Daily Activity     Outcome Measure   Help from another person eating meals?: None Help from another person taking care of personal grooming?: A Little Help from another person toileting, which includes using toliet, bedpan, or urinal?: A Little Help from another person bathing (including washing, rinsing, drying)?: None Help from another person to put on and taking off regular upper body clothing?: A Little Help from another person to put on and taking off regular lower body clothing?: A Lot 6 Click Score: 19    End of Session Equipment Utilized During Treatment: Gait belt;Rollator (4 wheels)  OT Visit Diagnosis: Unsteadiness on feet (R26.81);Other abnormalities of gait and mobility (R26.89);History of falling (Z91.81);Pain   Activity Tolerance Patient tolerated treatment well   Patient Left with call bell/phone within reach;in chair   Nurse Communication Mobility status        Time: 1610-9604 OT Time Calculation (min): 30 min  Charges: OT General Charges $OT Visit: 1 Visit OT Treatments $Therapeutic Activity: 8-22 mins  12/03/2022  AB, OTR/L  Acute Rehabilitation Services  Office: [phone]   MIR AMBER 12/03/2022, 12:51 PM

## 2022-12-03 NOTE — Progress Notes (Signed)
Rounding Note    Patient Name: Craig Schmidt Date of Encounter: 12/03/2022   [facility] Cardiologist: [name], MD   Subjective   No acute events overnight. Sleeping comfortably in bed  Inpatient Medications    Scheduled Meds:  amoxicillin-clavulanate  1 tablet Oral Q12H   apixaban  5 mg Oral BID   atorvastatin  40 mg Oral Daily   carvedilol  3.125 mg Oral BID   clopidogrel  75 mg Oral Daily   docusate sodium  100 mg Oral BID   empagliflozin  10 mg Oral Daily   feeding supplement  237 mL Oral BID BM   folic acid  1 mg Oral Daily   isosorbide-hydrALAZINE  0.5 tablet Oral TID   lidocaine  1 patch Transdermal Q24H   multivitamin with minerals  1 tablet Oral Daily   nicotine  21 mg Transdermal Daily   sodium chloride flush  3 mL Intravenous Q12H   thiamine  100 mg Oral Daily   Or   thiamine  100 mg Intravenous Daily   Continuous Infusions:  PRN Meds: acetaminophen **OR** acetaminophen, albuterol, bisacodyl, hydrALAZINE, hydrOXYzine, loperamide, methocarbamol, morphine injection, ondansetron **OR** ondansetron (ZOFRAN) IV, polyethylene glycol, polyvinyl alcohol   Vital Signs    Vitals:   12/03/22 0727 12/03/22 0840 12/03/22 1041 12/03/22 1328  BP: (!) 135/96  (!) 125/90   Pulse: 98 99 93 98  Resp: 20 18 18 19   Temp: 98 F (36.7 C)  97.7 F (36.5 C)   TempSrc: Oral  Oral   SpO2: 99% 96% 95% 99%  Weight:      Height:        Intake/Output Summary (Last 24 hours) at 12/03/2022 1445 Last data filed at 12/03/2022 1325 Gross per 24 hour  Intake 1353.96 ml  Output 1601 ml  Net -247.04 ml      12/03/2022    4:39 AM 12/02/2022    4:54 AM 12/01/2022    4:18 AM  Last 3 Weights  Weight (lbs) 134 lb 12.8 oz 148 lb 9.4 oz 156 lb 8.4 oz  Weight (kg) 61.145 kg 67.4 kg 71 kg      Telemetry    SR, 5 beats NSVT - Personally Reviewed  ECG    No new - Personally Reviewed  Physical Exam   GEN: No acute distress.   Neck: No JVD Cardiac: RRR,  no murmurs, rubs, or gallops.  Respiratory: Clear to auscultation bilaterally. GI: Soft, nontender, non-distended  MS: No edema; No deformity. Neuro:  Nonfocal  Psych: Normal affect   Labs    High Sensitivity Troponin:   Recent Labs  Lab 11/28/22 1348 11/28/22 1743 11/28/22 2238  TROPONINIHS 175* 311* 267*     Chemistry Recent Labs  Lab 11/28/22 1155 11/30/22 0931 12/01/22 0053 12/02/22 0033 12/03/22 0039  NA 134* 132* 134* 132* 129*  K 3.5 3.7 3.7 4.1 3.8  CL 106 90* 95* 91* 92*  CO2 18* 32 28 28 25   GLUCOSE 107* 100* 100* 118* 193*  BUN 17 21* 26* 29* 32*  CREATININE 1.69* 1.77* 1.93* 1.95* 2.16*  CALCIUM 7.6* 8.6* 8.4* 8.7* 8.7*  PROT 5.0* 6.1*  --   --   --   ALBUMIN 2.3* 2.2*  --   --   --   AST 35 17  --   --   --   ALT 14 12  --   --   --   ALKPHOS 81 83  --   --   --  BILITOT 1.0 1.1  --   --   --   GFRNONAA 49* 46* 42* 41* 36*  ANIONGAP 10 10 11 13 12     Lipids  Recent Labs  Lab 11/28/22 1743  CHOL 118  TRIG 60  HDL 27*  LDLCALC 79  CHOLHDL 4.4    Hematology Recent Labs  Lab 12/01/22 0053 12/02/22 0033 12/03/22 0039  WBC 13.4* 11.1* 10.6*  RBC 4.01* 4.31 4.52  HGB 12.1* 13.1 13.7  HCT 36.4* 39.1 41.6  MCV 90.8 90.7 92.0  MCH 30.2 30.4 30.3  MCHC 33.2 33.5 32.9  RDW 13.8 13.5 13.6  PLT 279 383 449*   Thyroid  Recent Labs  Lab 11/28/22 1743  TSH 1.196    BNP Recent Labs  Lab 11/28/22 1152  BNP 2,810.5*    DDimer No results for input(s): "DDIMER" in the last 168 hours.   Radiology    DG Chest 2 View  Result Date: 12/02/2022 CLINICAL DATA:  Pneumonia EXAM: CHEST - 2 VIEW COMPARISON:  Nov 28, 2022 FINDINGS: Left lower lobe consolidation is similar in the interval. No other interval changes. IMPRESSION: Persistent left lower lobe pneumonia. Electronically Signed   By: Gerome Sam III M.D.   On: 12/02/2022 10:31    Cardiac Studies   Echo 11/29/22  1. Left ventricular ejection fraction, by estimation, is 25 to 30%. The   left ventricle has severely decreased function. The left ventricle has no  regional wall motion abnormalities. The left ventricular internal cavity  size was moderately dilated. Left  ventricular diastolic parameters are indeterminate. Elevated left atrial  pressure.   2. Right ventricular systolic function is mildly reduced. The right  ventricular size is mildly enlarged. There is normal pulmonary artery  systolic pressure. The estimated right ventricular systolic pressure is  34.8 mmHg.   3. Left atrial size was mild to moderately dilated.   4. Right atrial size was mildly dilated.   5. The mitral valve is abnormally thickened with restricted leaflet  motion. The mitral valve leaflets appear to attach to one papillary muscle  consistent with parachute mitral valve. There is mild to moderate mitral  stenosis with MV mean gradient  at HR 105bpm. The mitral valve is abnormal. Mild mitral valve  regurgitation. The mean mitral valve gradient is 8.0 mmHg.   6. The aortic valve is tricuspid. There is mild calcification of the  aortic valve. There is mild thickening of the aortic valve. Aortic valve  regurgitation is trivial. Aortic valve sclerosis/calcification is present,  without any evidence of aortic  stenosis.   7. The inferior vena cava is normal in size with greater than 50%  respiratory variability, suggesting right atrial pressure of 3 mmHg.   Comparison(s): No significant change from prior study.   Patient Profile     52 y.o. male with PMH chronic systolic and diastolic heart failure, ischemic cardiomyopathy, CAD with inferolateral STEMI 11/2021, mild-moderate mitral stenosis, complex social situation. We are consulted for management of heart failure.  Assessment & Plan    Acute on chronic combined systolic and diastolic heart failure -EF 25-30% -admission weight 68.5 kg, current weight 61.1 kg. Charted net negative 9L but suspect this may be inaccurate -sodium down  today, Cr up, BUN up. 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

## 2022-12-03 NOTE — Patient Outreach (Signed)
Care Coordination  12/03/2022  Hagen Makarewicz Dickens 1971-03-22 [id number]  Craig Schmidt is currently admitted as an inpatient at [facility]. [facility] Managed Care team will follow the progress of Craig Schmidt and follow up upon discharge.   Estanislado Emms RN, BSN   [facility] RN Care Coordinator [phone]

## 2022-12-03 NOTE — Progress Notes (Signed)
PROGRESS NOTE    Craig Schmidt  ZOX:[id number] DOB: 1971/07/18 DOA: 11/28/2022 PCP: Rema Fendt, NP  52/M w/ tobacco abuse, COPD/asthma, CAD, CVA, chronic combined CHF (ER 30-35% and grade 2 DD in 03/2022), and history of vestibular schwannoma presenting with CP/SOB.  He reports not having access to medications for at least the last 4 months.  Complains of chronic back pain, chest pain, shortness of breath X 1 to 2 days  In the ER troponin 175, WBC 10, creatinine 1.7, chest x-ray with consolidation of left lower lobe, -Admitted, slowly improving on antibiotics and diuretics  Subjective: -Feels better overall, still has some left-sided pain with cough and deep inspiration  Assessment and Plan:  Acute on chronic combined CHF History of moderate mitral stenosis -Known mixed cardiomyopathy, EF 30% in 9/23 -Repeat echo with EF 25%, mildly reduced RV  -Noncompliant with medications, complicated by homelessness and substance abuse -Cards following, diuresed well on IV Lasix, 9.9 L negative, weight down 16lb, creatinine bumped to 2.1 will hold further IV Lasix today -Continue Jardiance, Coreg and BiDil -PT OT eval, increase activity, difficult placement per TOC  Aspiration pneumonia -Likely in the background of EtOH/substance abuse, also has a history of CVA -Treated with 4 days of IV Unasyn, changed to Augmentin today, continue DuoNebs, pulmonary toilet, repeat chest x-ray with left lower lobe pneumonia  AKI/CKD 3b -Baseline creatinine around 1.6-2 -Relatively stable, monitor on diuretics  History of CAD Demand ischemia -Troponins mildly elevated 175, 267, 311 -Cards following, Rx w/ IV heparin, now off -Continue Coreg, Plavix, BiDil, statin  Lethargy, mild encephalopathy -Multifactorial, improving, ammonia level was normal  History 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 -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, supposed to be on Eliquis -Resumed Eliquis  History of vestibular  schwannoma -Unclear when this was diagnosed or whether he's had any workup for it  Polysubstance abuse Cocaine, alcohol abuse -UDS positive for cocaine, counseled -Social work consult appreciated  Noncompliance Homelessness   DVT prophylaxis: Eliquis Code Status: Full code Family Communication: No family at bedside yesterday Disposition Plan: To be determined, possibly SNF  Consultants: Cards   Procedures:   Antimicrobials:    Objective: Vitals:   12/03/22 0045 12/03/22 0439 12/03/22 0727 12/03/22 0840  BP: 124/86 125/86 (!) 135/96   Pulse: 100 98 98 99  Resp:  20 20 18   Temp: 98.3 F (36.8 C) 98.5 F (36.9 C) 98 F (36.7 C)   TempSrc: Oral Oral Oral   SpO2: 98% 96% 99% 96%  Weight:  61.1 kg    Height:        Intake/Output Summary (Last 24 hours) at 12/03/2022 1012 Last data filed at 12/03/2022 0828 Gross per 24 hour  Intake 1113.96 ml  Output 2701 ml  Net -1587.04 ml   Filed Weights   12/01/22 0418 12/02/22 0454 12/03/22 0439  Weight: 71 kg 67.4 kg 61.1 kg    Examination:  General exam: Chronically ill male sitting up in bed, AAOx3 no distress HEENT: No JVD CVS: S1-S2, regular rhythm Lungs: Decreased breath sounds on the left, few scattered rhonchi Abdomen: Soft, nontender, bowel sounds present Extremities: No edema  Data Reviewed:   CBC: Recent Labs  Lab 11/28/22 1155 11/29/22 1032 11/30/22 0931 12/01/22 0053 12/02/22 0033 12/03/22 0039  WBC 10.6* 20.5* 16.6* 13.4* 11.1* 10.6*  NEUTROABS 8.4*  --   --   --   --   --   HGB 10.7* 12.3* 13.2 12.1* 13.1 13.7  HCT 32.6* 37.0*  40.0 36.4* 39.1 41.6  MCV 95.3 92.7 93.5 90.8 90.7 92.0  PLT 189 218 245 279 383 449*   Basic Metabolic Panel: Recent Labs  Lab 11/28/22 1155 11/30/22 0931 12/01/22 0053 12/02/22 0033 12/03/22 0039  NA 134* 132* 134* 132* 129*  K 3.5 3.7 3.7 4.1 3.8  CL 106 90* 95* 91* 92*  CO2 18* 32 28 28 25   GLUCOSE 107* 100* 100* 118* 193*  BUN 17 21* 26* 29* 32*   CREATININE 1.69* 1.77* 1.93* 1.95* 2.16*  CALCIUM 7.6* 8.6* 8.4* 8.7* 8.7*   GFR: Estimated Creatinine Clearance: 35.4 mL/min (A) (by C-G formula based on SCr of 2.16 mg/dL (H)). Liver Function Tests: Recent Labs  Lab 11/28/22 1155 11/30/22 0931  AST 35 17  ALT 14 12  ALKPHOS 81 83  BILITOT 1.0 1.1  PROT 5.0* 6.1*  ALBUMIN 2.3* 2.2*   No results for input(s): "LIPASE", "AMYLASE" in the last 168 hours. Recent Labs  Lab 11/29/22 1027  AMMONIA 32   Coagulation Profile: No results for input(s): "INR", "PROTIME" in the last 168 hours. Cardiac Enzymes: No results for input(s): "CKTOTAL", "CKMB", "CKMBINDEX", "TROPONINI" in the last 168 hours. BNP (last 3 results) No results for input(s): "PROBNP" in the last 8760 hours. HbA1C: No results for input(s): "HGBA1C" in the last 72 hours.  CBG: No results for input(s): "GLUCAP" in the last 168 hours. Lipid Profile: No results for input(s): "CHOL", "HDL", "LDLCALC", "TRIG", "CHOLHDL", "LDLDIRECT" in the last 72 hours.  Thyroid Function Tests: No results for input(s): "TSH", "T4TOTAL", "FREET4", "T3FREE", "THYROIDAB" in the last 72 hours.  Anemia Panel: No results for input(s): "VITAMINB12", "FOLATE", "FERRITIN", "TIBC", "IRON", "RETICCTPCT" in the last 72 hours. Urine analysis:    Component Value Date/Time   COLORURINE YELLOW 07/17/2022 1622   APPEARANCEUR HAZY (A) 07/17/2022 1622   LABSPEC 1.023 07/17/2022 1622   PHURINE 5.0 07/17/2022 1622   GLUCOSEU >=500 (A) 07/17/2022 1622   HGBUR NEGATIVE 07/17/2022 1622   BILIRUBINUR NEGATIVE 07/17/2022 1622   KETONESUR NEGATIVE 07/17/2022 1622   PROTEINUR 100 (A) 07/17/2022 1622   UROBILINOGEN 0.2 05/14/2015 0845   NITRITE NEGATIVE 07/17/2022 1622   LEUKOCYTESUR NEGATIVE 07/17/2022 1622   Sepsis Labs: @LABRCNTIP (procalcitonin:4,lacticidven:4)  ) Recent Results (from the past 240 hour(s))  Culture, blood (Routine X 2) w Reflex to ID Panel     Status: None (Preliminary result)    Collection Time: 11/29/22  3:01 PM   Specimen: BLOOD LEFT HAND  Result Value Ref Range Status   Specimen Description BLOOD LEFT HAND  Final   Special Requests   Final    BOTTLES DRAWN AEROBIC ONLY Blood Culture results may not be optimal due to an inadequate volume of blood received in culture bottles   Culture   Final    NO GROWTH 4 DAYS Performed at [facility], [address] [place], [place] [address]    Report Status PENDING  Incomplete  Culture, blood (Routine X 2) w Reflex to ID Panel     Status: None (Preliminary result)   Collection Time: 11/29/22  3:03 PM   Specimen: BLOOD LEFT HAND  Result Value Ref Range Status   Specimen Description BLOOD LEFT HAND  Final   Special Requests   Final    BOTTLES DRAWN AEROBIC ONLY Blood Culture results may not be optimal due to an inadequate volume of blood received in culture bottles   Culture   Final    NO GROWTH 4 DAYS Performed at [facility], [address], [place], [place] [address]    Report Status PENDING  Incomplete     Radiology Studies: DG Chest 2 View  Result Date: 12/02/2022 CLINICAL DATA:  Pneumonia EXAM: CHEST - 2 VIEW COMPARISON:  Nov 28, 2022 FINDINGS: Left lower lobe consolidation is similar in the interval. No other interval changes. IMPRESSION: Persistent left lower lobe pneumonia. Electronically Signed   By: Gerome Sam III M.D.   On: 12/02/2022 10:31     Scheduled Meds:  apixaban  5 mg Oral BID   atorvastatin  40 mg Oral Daily   carvedilol  3.125 mg Oral BID   clopidogrel  75 mg Oral Daily   docusate sodium  100 mg Oral BID   empagliflozin  10 mg Oral Daily   feeding supplement  237 mL Oral BID BM   folic acid  1 mg Oral Daily   ipratropium-albuterol  3 mL Nebulization TID   isosorbide-hydrALAZINE  0.5 tablet Oral TID   lidocaine  1 patch Transdermal Q24H   multivitamin with minerals  1 tablet Oral Daily   nicotine  21 mg Transdermal Daily   sodium chloride flush  3 mL Intravenous Q12H    spironolactone  12.5 mg Oral Daily   thiamine  100 mg Oral Daily   Or   thiamine  100 mg Intravenous Daily   Continuous Infusions:  ampicillin-sulbactam (UNASYN) IV 3 g (12/03/22 0833)     LOS: 4 days    Time spent:    Zannie Cove, MD [facility]   12/03/2022, 10:12 AM

## 2022-12-03 NOTE — Progress Notes (Signed)
Physical Therapy Treatment Patient Details Name: Craig Schmidt MRN: [id number] DOB: 1971-06-08 Today's Date: 12/03/2022   History of Present Illness Pt is a 52 year old male admitted on 11/28/22 for chest pain and SOB. Past medical history significant of asthma, CAD, CVA, chronic combined CHF (ER 30-35% and grade 2 DD in 03/2022), and brain tumor    PT Comments    Pt tolerated treatment well today. Pt demonstrated much improved mobility and pain today compared previous sessions. Pt was able to ambulate in hallway Mod I with no AD and trial rollator. Pt demonstrated good carryover with rollator brake management. Pt presents at or near baseline mobility. Pt has no further acute PT needs and will be signing off. Recommend OPPT for residual CVA deficits and pt requests rollator at DC. Re consult PT if mobility status changes.    Recommendations for follow up therapy are one component of a multi-disciplinary discharge planning process, led by the attending physician.  Recommendations may be updated based on patient status, additional functional criteria and insurance authorization.  Follow Up Recommendations       Assistance Recommended at Discharge Set up Supervision/Assistance  Patient can return home with the following A little help with walking and/or transfers;A little help with bathing/dressing/bathroom;Assistance with cooking/housework;Direct supervision/assist for medications management;Direct supervision/assist for financial management;Assist for transportation;Help with stairs or ramp for entrance   Equipment Recommendations  Rollator (4 wheels)    Recommendations for Other Services       Precautions / Restrictions Precautions Precautions: Fall Restrictions Weight Bearing Restrictions: No     Mobility  Bed Mobility Overal bed mobility: Modified Independent                  Transfers Overall transfer level: Modified independent Equipment used: Rollator (4 wheels),  None Transfers: Sit to/from Stand Sit to Stand: Modified independent (Device/Increase time)           General transfer comment: Pt educated on the use of Rollator to complete functional transfers    Ambulation/Gait Ambulation/Gait assistance: Modified independent (Device/Increase time) Gait Distance (Feet): 300 Feet Assistive device: None, Rollator (4 wheels) Gait Pattern/deviations: Decreased stride length, Step-through pattern, Wide base of support (Both LE externally rotated) Gait velocity: decreased     General Gait Details: Pt initially ambulated with no AD with slow steady gait. PT then requested to trial rollator and was educated on English as a second language teacher.   Stairs             Wheelchair Mobility    Modified Rankin (Stroke Patients Only)       Balance Overall balance assessment: Needs assistance Sitting-balance support: Feet supported Sitting balance-Leahy Scale: Good     Standing balance support: Bilateral upper extremity supported, During functional activity Standing balance-Leahy Scale: Good Standing balance comment: intially walked in hall no AD                            Cognition Arousal/Alertness: Awake/alert Behavior During Therapy: WFL for tasks assessed/performed Overall Cognitive Status: Within Functional Limits for tasks assessed                                          Exercises      General Comments General comments (skin integrity, edema, etc.): VSS on RA      Pertinent Vitals/Pain Pain Assessment Pain Assessment:  No/denies pain    Home Living                          Prior Function            PT Goals (current goals can now be found in the care plan section) Progress towards PT goals: Goals met/education completed, patient discharged from PT    Frequency    Min 1X/week      PT Plan Discharge plan needs to be updated    Co-evaluation PT/OT/SLP Co-Evaluation/Treatment:  Yes Reason for Co-Treatment: Complexity of the patient's impairments (multi-system involvement) PT goals addressed during session: Mobility/safety with mobility;Proper use of DME OT goals addressed during session: ADL's and self-care;Other (comment) (CHF education)      AM-PAC PT "6 Clicks" Mobility   Outcome Measure  Help needed turning from your back to your side while in a flat bed without using bedrails?: None Help needed moving from lying on your back to sitting on the side of a flat bed without using bedrails?: None Help needed moving to and from a bed to a chair (including a wheelchair)?: None Help needed standing up from a chair using your arms (e.g., wheelchair or bedside chair)?: None Help needed to walk in hospital room?: None Help needed climbing 3-5 steps with a railing? : A Little 6 Click Score: 23    End of Session Equipment Utilized During Treatment: Gait belt Activity Tolerance: Patient tolerated treatment well Patient left: in chair;with call bell/phone within reach Nurse Communication: Mobility status PT Visit Diagnosis: Other abnormalities of gait and mobility (R26.89)     Time: 1610-9604 PT Time Calculation (min) (ACUTE ONLY): 35 min  Charges:  $Gait Training: 8-22 mins                     Shela Nevin, PT, DPT Acute Rehab Services [phone]    [name] 12/03/2022, 1:00 PM

## 2022-12-04 ENCOUNTER — Other Ambulatory Visit (HOSPITAL_COMMUNITY): Payer: Self-pay

## 2022-12-04 DIAGNOSIS — I5043 Acute on chronic combined systolic (congestive) and diastolic (congestive) heart failure: Secondary | ICD-10-CM | POA: Diagnosis not present

## 2022-12-04 DIAGNOSIS — Z91148 Patient's other noncompliance with medication regimen for other reason: Secondary | ICD-10-CM | POA: Diagnosis not present

## 2022-12-04 DIAGNOSIS — Z5900 Homelessness unspecified: Secondary | ICD-10-CM | POA: Diagnosis not present

## 2022-12-04 LAB — BASIC METABOLIC PANEL
Anion gap: 11 (ref 5–15)
BUN: 36 mg/dL — ABNORMAL HIGH (ref 6–20)
CO2: 23 mmol/L (ref 22–32)
Calcium: 9 mg/dL (ref 8.9–10.3)
Chloride: 98 mmol/L (ref 98–111)
Creatinine, Ser: 2.02 mg/dL — ABNORMAL HIGH (ref 0.61–1.24)
GFR, Estimated: 39 mL/min — ABNORMAL LOW (ref >60)
Glucose, Bld: 107 mg/dL — ABNORMAL HIGH (ref 70–99)
Potassium: 4.5 mmol/L (ref 3.5–5.1)
Sodium: 132 mmol/L — ABNORMAL LOW (ref 135–145)

## 2022-12-04 LAB — CBC
HCT: 41.8 % (ref 39.0–52.0)
Hemoglobin: 13.7 g/dL (ref 13.0–17.0)
MCH: 30.2 pg (ref 26.0–34.0)
MCHC: 32.8 g/dL (ref 30.0–36.0)
MCV: 92.1 fL (ref 80.0–100.0)
Platelets: 508 10*3/uL — ABNORMAL HIGH (ref 150–400)
RBC: 4.54 MIL/uL (ref 4.22–5.81)
RDW: 13.7 % (ref 11.5–15.5)
WBC: 9.6 10*3/uL (ref 4.0–10.5)
nRBC: 0 % (ref 0.0–0.2)

## 2022-12-04 LAB — CULTURE, BLOOD (ROUTINE X 2): Culture: NO GROWTH

## 2022-12-04 MED ORDER — ATORVASTATIN CALCIUM 40 MG PO TABS
40.0000 mg | ORAL_TABLET | Freq: Every day | ORAL | 0 refills | Status: DC
  Filled 2022-12-04: qty 30, 30d supply, fill #0

## 2022-12-04 MED ORDER — CLOPIDOGREL BISULFATE 75 MG PO TABS
75.0000 mg | ORAL_TABLET | Freq: Every day | ORAL | 0 refills | Status: DC
  Filled 2022-12-04: qty 30, 30d supply, fill #0

## 2022-12-04 MED ORDER — FUROSEMIDE 40 MG PO TABS
40.0000 mg | ORAL_TABLET | Freq: Every day | ORAL | 0 refills | Status: DC
  Filled 2022-12-04: qty 30, 30d supply, fill #0

## 2022-12-04 MED ORDER — CARVEDILOL 3.125 MG PO TABS
3.1250 mg | ORAL_TABLET | Freq: Two times a day (BID) | ORAL | 0 refills | Status: DC
  Filled 2022-12-04: qty 60, 30d supply, fill #0

## 2022-12-04 MED ORDER — APIXABAN 5 MG PO TABS
5.0000 mg | ORAL_TABLET | Freq: Two times a day (BID) | ORAL | 0 refills | Status: DC
  Filled 2022-12-04: qty 60, 30d supply, fill #0

## 2022-12-04 MED ORDER — ISOSORB DINITRATE-HYDRALAZINE 20-37.5 MG PO TABS
0.5000 | ORAL_TABLET | Freq: Three times a day (TID) | ORAL | 0 refills | Status: DC
  Filled 2022-12-04: qty 60, 40d supply, fill #0

## 2022-12-04 MED ORDER — ISOSORB DINITRATE-HYDRALAZINE 20-37.5 MG PO TABS
1.0000 | ORAL_TABLET | Freq: Three times a day (TID) | ORAL | Status: DC
  Administered 2022-12-04 – 2022-12-05 (×3): 1 via ORAL
  Filled 2022-12-04 (×3): qty 1

## 2022-12-04 MED ORDER — EMPAGLIFLOZIN 10 MG PO TABS
10.0000 mg | ORAL_TABLET | Freq: Every day | ORAL | 0 refills | Status: DC
  Filled 2022-12-04: qty 30, 30d supply, fill #0

## 2022-12-04 MED ORDER — AMOXICILLIN-POT CLAVULANATE 875-125 MG PO TABS
1.0000 | ORAL_TABLET | Freq: Two times a day (BID) | ORAL | 0 refills | Status: AC
  Filled 2022-12-04: qty 4, 2d supply, fill #0

## 2022-12-04 NOTE — TOC Transition Note (Signed)
TOC discharge medications ready and LOCATED IN TOC pharmacy on 2nd floor awaiting pt discharge.  

## 2022-12-04 NOTE — Progress Notes (Signed)
Rounding Note    Patient Name: Craig Schmidt Date of Encounter: 12/04/2022  [facility] Cardiologist: [name], MD   Subjective   No acute events overnight. Walked with PT this AM. His biggest concern is his housing instability. He notes that when he had a stable location he was able to take his medication, but once he was sleeping on the street he couldn't keep up with his meds. He wants to stay out of the hospital and take his meds but is worried about the challenges.  Inpatient Medications    Scheduled Meds:  amoxicillin-clavulanate  1 tablet Oral Q12H   apixaban  5 mg Oral BID   atorvastatin  40 mg Oral Daily   carvedilol  3.125 mg Oral BID   clopidogrel  75 mg Oral Daily   docusate sodium  100 mg Oral BID   empagliflozin  10 mg Oral Daily   feeding supplement  237 mL Oral BID BM   folic acid  1 mg Oral Daily   isosorbide-hydrALAZINE  1 tablet Oral TID   lidocaine  1 patch Transdermal Q24H   multivitamin with minerals  1 tablet Oral Daily   nicotine  21 mg Transdermal Daily   sodium chloride flush  3 mL Intravenous Q12H   thiamine  100 mg Oral Daily   Or   thiamine  100 mg Intravenous Daily   Continuous Infusions:  PRN Meds: acetaminophen **OR** acetaminophen, albuterol, bisacodyl, hydrALAZINE, morphine injection, ondansetron **OR** ondansetron (ZOFRAN) IV, polyethylene glycol, polyvinyl alcohol   Vital Signs    Vitals:   12/03/22 2105 12/04/22 0016 12/04/22 0453 12/04/22 0736  BP: (!) 138/91 (!) 128/103 (!) 145/97 (!) 132/100  Pulse: 96 95 69 98  Resp: 18   17  Temp: 98.8 F (37.1 C) 97.6 F (36.4 C) 97.9 F (36.6 C) 97.9 F (36.6 C)  TempSrc: Oral Oral Oral Oral  SpO2: 99% 99% 98% 98%  Weight:   61.7 kg   Height:        Intake/Output Summary (Last 24 hours) at 12/04/2022 0949 Last data filed at 12/04/2022 0453 Gross per 24 hour  Intake 600 ml  Output 900 ml  Net -300 ml      12/04/2022    4:53 AM 12/03/2022    4:39 AM  12/02/2022    4:54 AM  Last 3 Weights  Weight (lbs) 136 lb 1.6 oz 134 lb 12.8 oz 148 lb 9.4 oz  Weight (kg) 61.735 kg 61.145 kg 67.4 kg      Telemetry    SR, 6 beats NSVT last night- Personally Reviewed  ECG    No new - Personally Reviewed  Physical Exam   GEN: No acute distress.   Neck: No JVD Cardiac: RRR, no murmurs, rubs, or gallops.  Respiratory: Clear to auscultation bilaterally. GI: Soft, nontender, non-distended  MS: No edema; No deformity. Neuro:  Nonfocal  Psych: Normal affect   Labs    High Sensitivity Troponin:   Recent Labs  Lab 11/28/22 1348 11/28/22 1743 11/28/22 2238  TROPONINIHS 175* 311* 267*     Chemistry Recent Labs  Lab 11/28/22 1155 11/30/22 0931 12/01/22 0053 12/02/22 0033 12/03/22 0039 12/04/22 0047  NA 134* 132*   < > 132* 129* 132*  K 3.5 3.7   < > 4.1 3.8 4.5  CL 106 90*   < > 91* 92* 98  CO2 18* 32   < > 28 25 23   GLUCOSE 107* 100*   < >  118* 193* 107*  BUN 17 21*   < > 29* 32* 36*  CREATININE 1.69* 1.77*   < > 1.95* 2.16* 2.02*  CALCIUM 7.6* 8.6*   < > 8.7* 8.7* 9.0  PROT 5.0* 6.1*  --   --   --   --   ALBUMIN 2.3* 2.2*  --   --   --   --   AST 35 17  --   --   --   --   ALT 14 12  --   --   --   --   ALKPHOS 81 83  --   --   --   --   BILITOT 1.0 1.1  --   --   --   --   GFRNONAA 49* 46*   < > 41* 36* 39*  ANIONGAP 10 10   < > 13 12 11    < > = values in this interval not displayed.    Lipids  Recent Labs  Lab 11/28/22 1743  CHOL 118  TRIG 60  HDL 27*  LDLCALC 79  CHOLHDL 4.4    Hematology Recent Labs  Lab 12/02/22 0033 12/03/22 0039 12/04/22 0047  WBC 11.1* 10.6* 9.6  RBC 4.31 4.52 4.54  HGB 13.1 13.7 13.7  HCT 39.1 41.6 41.8  MCV 90.7 92.0 92.1  MCH 30.4 30.3 30.2  MCHC 33.5 32.9 32.8  RDW 13.5 13.6 13.7  PLT 383 449* 508*   Thyroid  Recent Labs  Lab 11/28/22 1743  TSH 1.196    BNP Recent Labs  Lab 11/28/22 1152  BNP 2,810.5*    DDimer No results for input(s): "DDIMER" in the last 168  hours.   Radiology    DG Chest 2 View  Result Date: 12/02/2022 CLINICAL DATA:  Pneumonia EXAM: CHEST - 2 VIEW COMPARISON:  Nov 28, 2022 FINDINGS: Left lower lobe consolidation is similar in the interval. No other interval changes. IMPRESSION: Persistent left lower lobe pneumonia. Electronically Signed   By: Gerome Sam III M.D.   On: 12/02/2022 10:31    Cardiac Studies   Echo 11/29/22  1. Left ventricular ejection fraction, by estimation, is 25 to 30%. The  left ventricle has severely decreased function. The left ventricle has no  regional wall motion abnormalities. The left ventricular internal cavity  size was moderately dilated. Left  ventricular diastolic parameters are indeterminate. Elevated left atrial  pressure.   2. Right ventricular systolic function is mildly reduced. The right  ventricular size is mildly enlarged. There is normal pulmonary artery  systolic pressure. The estimated right ventricular systolic pressure is  34.8 mmHg.   3. Left atrial size was mild to moderately dilated.   4. Right atrial size was mildly dilated.   5. The mitral valve is abnormally thickened with restricted leaflet  motion. The mitral valve leaflets appear to attach to one papillary muscle  consistent with parachute mitral valve. There is mild to moderate mitral  stenosis with MV mean gradient  at HR 105bpm. The mitral valve is abnormal. Mild mitral valve  regurgitation. The mean mitral valve gradient is 8.0 mmHg.   6. The aortic valve is tricuspid. There is mild calcification of the  aortic valve. There is mild thickening of the aortic valve. Aortic valve  regurgitation is trivial. Aortic valve sclerosis/calcification is present,  without any evidence of aortic  stenosis.   7. The inferior vena cava is normal in size with greater than 50%  respiratory variability, suggesting  right atrial pressure of 3 mmHg.   Comparison(s): No significant change from prior study.   Patient  Profile     52 y.o. male with PMH chronic systolic and diastolic heart failure, ischemic cardiomyopathy, CAD with inferolateral STEMI 11/2021, mild-moderate mitral stenosis, complex social situation. We are consulted for management of heart failure.  Assessment & Plan    Acute on chronic combined systolic and diastolic heart failure -EF 25-30% -admission weight 68.5 kg, current weight 61.7 kg. Charted net negative 10L but suspect this may be inaccurate -sodium, Cr improved with holding lasix. Appears euvolemic -continue isosorbide-hydralazine. 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 Discussed with Dr. Jomarie Longs, who is working with social work.  For questions or updates, please contact [facility] Please consult [email or web address] for contact info under     Signed, Jodelle Red, MD  12/04/2022, 9:49 AM

## 2022-12-04 NOTE — Discharge Summary (Signed)
Physician Discharge Summary  Craig Schmidt WRU:[id number] DOB: 1970-10-05 DOA: 11/28/2022  PCP: Craig Fendt, NP  Admit date: 11/28/2022 Discharge date: 12/05/2022  Time spent: 35 minutes  Recommendations for Outpatient Follow-up:  Advanced heart failure clinic on 5/21 Social work to follow patient for long-term housing options   Discharge Diagnoses:  Principal Problem:   Acute on chronic combined systolic and diastolic CHF Left lower lobe pneumonia   Homelessness   Cocaine abuse (HCC)   Smoker   Alcohol abuse   ACS (acute coronary syndrome) (HCC)   Back pain   Essential hypertension   Dyslipidemia   Noncompliance with medication regimen   Acute on chronic combined systolic and diastolic CHF (congestive heart failure) (HCC)   Malnutrition of moderate degree   Acute on chronic combined systolic and diastolic heart failure (HCC)   Discharge Condition: Stable  Diet recommendation: Low-sodium  Filed Weights   12/02/22 0454 12/03/22 0439 12/04/22 0453  Weight: 67.4 kg 61.1 kg 61.7 kg    History of present illness:  52/M w/ tobacco abuse, COPD/asthma, CAD, CVA, chronic combined CHF (ER 30-35% and grade 2 DD in 03/2022), and history of vestibular schwannoma presenting with CP/SOB.  He reports not having access to medications for at least the last 4 months.  Complains of chronic back pain, chest pain, shortness of breath X 1 to 2 days  In the ER troponin 175, WBC 10, creatinine 1.7, chest x-ray with consolidation of left lower lobe, -Admitted, slowly improving on antibiotics and diuretics  Hospital Course:   Acute on chronic combined CHF History of moderate mitral stenosis -Known mixed cardiomyopathy, EF 30% in 9/23 -Repeat echo with EF 25%, mildly reduced RV  -Noncompliant with medications, complicated by homelessness and substance abuse -Cards following, diuresed well on IV Lasix, 9.9 L negative, weight down 16lb, now euvolemic, diuretics on hold, resume oral Lasix  tomorrow -Continue Jardiance, Coreg and BiDil -Seen by social work, no placement options at this time -Follow-up in CHF clinic 5/21   Aspiration pneumonia -Likely in the background of EtOH/substance abuse, also has a history of CVA -Treated with 4 days of IV Unasyn, changed to Augmentin yesterday, continue for 2 more days, continue DuoNebs, pulmonary toilet, repeat chest x-ray with left lower lobe pneumonia   AKI/CKD 3b -Baseline creatinine around 1.6-2 -Relatively stable   History of CAD Demand ischemia -Troponins mildly elevated 175, 267, 311 -Cards following, Rx w/ IV heparin, now off -Continue Coreg, Plavix, BiDil, statin   Lethargy, mild encephalopathy -Multifactorial, improving, ammonia level was normal   History 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, supposed to be on Eliquis at baseline -Resumed Eliquis   History of vestibular schwannoma -Unclear when this was diagnosed or whether he's had any workup for it   Polysubstance abuse Cocaine, alcohol abuse -UDS positive for cocaine, counseled -Social work consult appreciated   Noncompliance Homelessness -Complicated by substance abuse, has a dog which limits access to shelters, seen by social work, will likely discharged to a motel    Discharge Exam: Vitals:   12/04/22 0736 12/04/22 1024  BP: (!) 132/100 (!) 105/93  Pulse: 98 (!) 103  Resp: 17 19  Temp: 97.9 F (36.6 C) 97.9 F (36.6 C)  SpO2: 98% 99%   Gen: Awake, Alert, Oriented X 3,  HEENT: no JVD Lungs: Decreased breath sounds on the left CVS: S1S2/RRR Abd: soft, Non tender, non distended, BS present Extremities: No edema Skin: no new rashes on exposed skin  Discharge Instructions    Allergies as of 12/04/2022       Reactions   Shellfish Allergy Anaphylaxis   Peanut-containing Drug Products Itching        Medication List     STOP taking these medications    Artificial Tears 0.2-0.2-1 % Soln Generic drug:  Glycerin-Hypromellose-PEG 400   digoxin 0.125 MG tablet Commonly known as: LANOXIN   Entresto 24-26 MG Generic drug: sacubitril-valsartan   famotidine 20 MG tablet Commonly known as: PEPCID   lamoTRIgine 25 MG tablet Commonly known as: LaMICtal   potassium chloride SA 20 MEQ tablet Commonly known as: KLOR-CON M   spironolactone 25 MG tablet Commonly known as: ALDACTONE   torsemide 20 MG tablet Commonly known as: DEMADEX   triamcinolone ointment 0.5 % Commonly known as: KENALOG       TAKE these medications    amoxicillin-clavulanate 875-125 MG tablet Commonly known as: AUGMENTIN Take 1 tablet by mouth every 12 (twelve) hours for 2 days.   apixaban 5 MG Tabs tablet Commonly known as: ELIQUIS Take 1 tablet (5 mg total) by mouth 2 (two) times daily.   atorvastatin 40 MG tablet Commonly known as: LIPITOR Take 1 tablet (40 mg total) by mouth daily. What changed: See the new instructions.   carvedilol 3.125 MG tablet Commonly known as: COREG Take 1 tablet (3.125 mg total) by mouth 2 (two) times daily.   clopidogrel 75 MG tablet Commonly known as: PLAVIX Take 1 tablet (75 mg total) by mouth daily.   empagliflozin 10 MG Tabs tablet Commonly known as: Jardiance Take 1 tablet (10 mg total) by mouth daily.   furosemide 40 MG tablet Commonly known as: Lasix Take 1 tablet (40 mg total) by mouth daily. Start taking on: Dec 05, 2022   isosorbide-hydrALAZINE 20-37.5 MG tablet Commonly known as: BIDIL Take 0.5 tablets by mouth 3 (three) times daily. Take 1 & 1/2 tablets by mouth 3 times daily.       Allergies  Allergen Reactions   Shellfish Allergy Anaphylaxis   Peanut-Containing Drug Products Itching    Follow-up Information     [facility]. Go on 12/11/2022.   Specialty: Cardiology Why: 10:30 AM, [facility], Entrance C Contact information: [address] [id number] [name]  [place] [address] [phone]                 The results of significant diagnostics from this hospitalization (including imaging, microbiology, ancillary and laboratory) are listed below for reference.    Significant Diagnostic Studies: DG Chest 2 View  Result Date: 12/02/2022 CLINICAL DATA:  Pneumonia EXAM: CHEST - 2 VIEW COMPARISON:  Nov 28, 2022 FINDINGS: Left lower lobe consolidation is similar in the interval. No other interval changes. IMPRESSION: Persistent left lower lobe pneumonia. Electronically Signed   By: Gerome Sam III M.D.   On: 12/02/2022 10:31   ECHOCARDIOGRAM COMPLETE  Result Date: 11/29/2022    ECHOCARDIOGRAM REPORT   Patient Name:   PJ BARTSCHI Date of Exam: 11/29/2022 Medical Rec #:  [id number]          Height:       67.0 in Accession #:    [id number]         Weight:       152.8 lb Date of Birth:  1971/04/03           BSA:          1.804 m Patient Age:  52 years           BP:           139/108 mmHg Patient Gender: M                  HR:           105 bpm. Exam Location:  Inpatient Procedure: 2D Echo, Cardiac Doppler, Color Doppler and Intracardiac            Opacification Agent Indications:    I50.40* Unspecified combined systolic (congestive) and diastolic                 (congestive) heart failure  History:        Patient has prior history of Echocardiogram examinations. CHF,                 Previous Myocardial Infarction, Mitral Valve Disease; Risk                 Factors:Hypertension, Dyslipidemia and Current Smoker. ETOH.                 Cocaine use. Mitral stenosis.  Sonographer:    Sheralyn Boatman RDCS Referring Phys: Jonah Blue IMPRESSIONS  1. Left ventricular ejection fraction, by estimation, is 25 to 30%. The left ventricle has severely decreased function. The left ventricle has no regional wall motion abnormalities. The left ventricular internal cavity size was moderately dilated. Left ventricular diastolic parameters are indeterminate. Elevated left atrial  pressure.  2. Right ventricular systolic function is mildly reduced. The right ventricular size is mildly enlarged. There is normal pulmonary artery systolic pressure. The estimated right ventricular systolic pressure is 34.8 mmHg.  3. Left atrial size was mild to moderately dilated.  4. Right atrial size was mildly dilated.  5. The mitral valve is abnormally thickened with restricted leaflet motion. The mitral valve leaflets appear to attach to one papillary muscle consistent with parachute mitral valve. There is mild to moderate mitral stenosis with MV mean gradient at HR 105bpm. The mitral valve is abnormal. Mild mitral valve regurgitation. The mean mitral valve gradient is 8.0 mmHg.  6. The aortic valve is tricuspid. There is mild calcification of the aortic valve. There is mild thickening of the aortic valve. Aortic valve regurgitation is trivial. Aortic valve sclerosis/calcification is present, without any evidence of aortic stenosis.  7. The inferior vena cava is normal in size with greater than 50% respiratory variability, suggesting right atrial pressure of 3 mmHg. Comparison(s): No significant change from prior study. FINDINGS  Left Ventricle: Left ventricular ejection fraction, by estimation, is 25 to 30%. The left ventricle has severely decreased function. The left ventricle has no regional wall motion abnormalities. Definity contrast agent was given IV to delineate the left  ventricular endocardial borders. The left ventricular internal cavity size was moderately dilated. There is no left ventricular hypertrophy. Left ventricular diastolic parameters are indeterminate. Elevated left atrial pressure. Right Ventricle: The right ventricular size is mildly enlarged. No increase in right ventricular wall thickness. Right ventricular systolic function is mildly reduced. There is normal pulmonary artery systolic pressure. The tricuspid regurgitant velocity  is 2.82 m/s, and with an assumed right atrial  pressure of 3 mmHg, the estimated right ventricular systolic pressure is 34.8 mmHg. Left Atrium: Left atrial size was mild to moderately dilated. Right Atrium: Right atrial size was mildly dilated. Pericardium: There is no evidence of pericardial effusion. Mitral Valve: The mitral valve is abnormally thickened with restricted leaflet  motion. The mitral valve leaflets appear to attach to one papillary muscle consistent with parachute mitral valve. There is mild to moderate mitral stenosis with MV mean gradient at HR 105bpm. The mitral valve is abnormal. There is moderate thickening of the mitral valve leaflet(s). There is mild calcification of the mitral valve leaflet(s). Mild mitral valve regurgitation. MV peak gradient, 16.5 mmHg. The mean mitral valve gradient is 8.0 mmHg. Tricuspid Valve: The tricuspid valve is normal in structure. Tricuspid valve regurgitation is mild. Aortic Valve: The aortic valve is tricuspid. There is mild calcification of the aortic valve. There is mild thickening of the aortic valve. Aortic valve regurgitation is trivial. Aortic valve sclerosis/calcification is present, without any evidence of aortic stenosis. Pulmonic Valve: The pulmonic valve was normal in structure. Pulmonic valve regurgitation is trivial. Aorta: The aortic root is normal in size and structure. Venous: The inferior vena cava is normal in size with greater than 50% respiratory variability, suggesting right atrial pressure of 3 mmHg. IAS/Shunts: The atrial septum is grossly normal.  LEFT VENTRICLE PLAX 2D LVIDd:         5.40 cm      Diastology LVIDs:         4.70 cm      LV e' medial:    3.60 cm/s LV PW:         1.70 cm      LV E/e' medial:  52.1 LV IVS:        1.10 cm      LV e' lateral:   2.18 cm/s LVOT diam:     2.40 cm      LV E/e' lateral: 86.0 LV SV:         65 LV SV Index:   36 LVOT Area:     4.52 cm  LV Volumes (MOD) LV vol d, MOD A2C: 148.0 ml LV vol d, MOD A4C: 160.0 ml LV vol s, MOD A2C: 82.4 ml LV vol  s, MOD A4C: 125.0 ml LV SV MOD A2C:     65.6 ml LV SV MOD A4C:     160.0 ml LV SV MOD BP:      45.9 ml RIGHT VENTRICLE             IVC RV S prime:     10.20 cm/s  IVC diam: 1.90 cm TAPSE (M-mode): 1.9 cm LEFT ATRIUM             Index        RIGHT ATRIUM           Index LA diam:        4.90 cm 2.72 cm/m   RA Area:     18.60 cm LA Vol (A2C):   47.8 ml 26.50 ml/m  RA Volume:   55.90 ml  30.99 ml/m LA Vol (A4C):   65.1 ml 36.10 ml/m LA Biplane Vol: 58.2 ml 32.27 ml/m  AORTIC VALVE             PULMONIC VALVE LVOT Vmax:   110.00 cm/s PR End Diast Vel: 1.68 msec LVOT Vmean:  69.400 cm/s LVOT VTI:    0.143 m  AORTA Ao Root diam: 3.10 cm Ao Asc diam:  2.70 cm MITRAL VALVE                TRICUSPID VALVE MV Area (PHT): 5.27 cm     TR Peak grad:   31.8 mmHg MV Area VTI:   1.85 cm  TR Vmax:        282.00 cm/s MV Peak grad:  16.5 mmHg MV Mean grad:  8.0 mmHg     SHUNTS MV Vmax:       2.03 m/s     Systemic VTI:  0.14 m MV Vmean:      126.0 cm/s   Systemic Diam: 2.40 cm MV Decel Time: 144 msec MV E velocity: 187.50 cm/s MV A velocity: 144.00 cm/s MV E/A ratio:  1.30 Laurance Flatten MD Electronically signed by Laurance Flatten MD Signature Date/Time: 11/29/2022/2:32:41 PM    Final    DG Chest 2 View  Result Date: 11/28/2022 CLINICAL DATA:  Left chest pain with shortness of breath. Rhonchi in the upper and lower left lung. Wheezing on the right. EXAM: CHEST - 2 VIEW COMPARISON:  08/03/2022 FINDINGS: Cardiac enlargement. No vascular congestion. Consolidation demonstrated in the left lower lung likely representing pneumonia. Follow-up to resolution is recommended to exclude central obstructing process. Right lung is clear. No pleural effusions. No pneumothorax. Mediastinal contours appear intact. Old right rib fracture. IMPRESSION: 1. Consolidation in the left lower lung likely representing pneumonia. 2. Mild cardiac enlargement. No vascular congestion or edema demonstrated. Electronically Signed   By: Burman Nieves  M.D.   On: 11/28/2022 12:31    Microbiology: Recent Results (from the past 240 hour(s))  Culture, blood (Routine X 2) w Reflex to ID Panel     Status: None   Collection Time: 11/29/22  3:01 PM   Specimen: BLOOD LEFT HAND  Result Value Ref Range Status   Specimen Description BLOOD LEFT HAND  Final   Special Requests   Final    BOTTLES DRAWN AEROBIC ONLY Blood Culture results may not be optimal due to an inadequate volume of blood received in culture bottles   Culture   Final    NO GROWTH 5 DAYS Performed at [facility], [address], [place], [place] [address]    Report Status 12/04/2022 FINAL  Final  Culture, blood (Routine X 2) w Reflex to ID Panel     Status: None   Collection Time: 11/29/22  3:03 PM   Specimen: BLOOD LEFT HAND  Result Value Ref Range Status   Specimen Description BLOOD LEFT HAND  Final   Special Requests   Final    BOTTLES DRAWN AEROBIC ONLY Blood Culture results may not be optimal due to an inadequate volume of blood received in culture bottles   Culture   Final    NO GROWTH 5 DAYS Performed at [facility], [address], [place], [place] [address]    Report Status 12/04/2022 FINAL  Final     Labs: Basic Metabolic Panel: Recent Labs  Lab 11/30/22 0931 12/01/22 0053 12/02/22 0033 12/03/22 0039 12/04/22 0047  NA 132* 134* 132* 129* 132*  K 3.7 3.7 4.1 3.8 4.5  CL 90* 95* 91* 92* 98  CO2 32 28 28 25 23   GLUCOSE 100* 100* 118* 193* 107*  BUN 21* 26* 29* 32* 36*  CREATININE 1.77* 1.93* 1.95* 2.16* 2.02*  CALCIUM 8.6* 8.4* 8.7* 8.7* 9.0   Liver Function Tests: Recent Labs  Lab 11/28/22 1155 11/30/22 0931  AST 35 17  ALT 14 12  ALKPHOS 81 83  BILITOT 1.0 1.1  PROT 5.0* 6.1*  ALBUMIN 2.3* 2.2*   No results for input(s): "LIPASE", "AMYLASE" in the last 168 hours. Recent Labs  Lab 11/29/22 1027  AMMONIA 32   CBC: Recent Labs  Lab  11/28/22 1155 11/29/22 1032 11/30/22 0931 12/01/22 0053 12/02/22 0033 12/03/22 0039  12/04/22 0047  WBC 10.6*   < > 16.6* 13.4* 11.1* 10.6* 9.6  NEUTROABS 8.4*  --   --   --   --   --   --   HGB 10.7*   < > 13.2 12.1* 13.1 13.7 13.7  HCT 32.6*   < > 40.0 36.4* 39.1 41.6 41.8  MCV 95.3   < > 93.5 90.8 90.7 92.0 92.1  PLT 189   < > 245 279 383 449* 508*   < > = values in this interval not displayed.   Cardiac Enzymes: No results for input(s): "CKTOTAL", "CKMB", "CKMBINDEX", "TROPONINI" in the last 168 hours. BNP: BNP (last 3 results) Recent Labs    07/10/22 1121 07/21/22 2325 11/28/22 1152  BNP 1,224.0* 466.0* 2,810.5*    ProBNP (last 3 results) No results for input(s): "PROBNP" in the last 8760 hours.  CBG: No results for input(s): "GLUCAP" in the last 168 hours.     Signed:  Zannie Cove MD.  [facility] 12/04/2022, 11:42 AM

## 2022-12-04 NOTE — Progress Notes (Signed)
Mobility Specialist Progress Note:   12/04/22 0930  Mobility  Activity Ambulated with assistance in hallway  Level of Assistance Contact guard assist, steadying assist  Assistive Device Four wheel walker  Distance Ambulated (ft) 250 ft  Activity Response Tolerated well  Mobility Referral Yes  $Mobility charge 1 Mobility  Mobility Specialist Start Time (ACUTE ONLY) 0930  Mobility Specialist Stop Time (ACUTE ONLY) 0945  Mobility Specialist Time Calculation (min) (ACUTE ONLY) 15 min   Pt eager for mobility session. Required minG assist throughout ambulation with rollator. X1 seated rest break required d/t SOB. SpO2 WFL on RA. Pt left sitting in chair, MD in room.   Addison Lank Mobility Specialist Please contact via SecureChat or  Rehab office at [phone]

## 2022-12-05 DIAGNOSIS — I5043 Acute on chronic combined systolic (congestive) and diastolic (congestive) heart failure: Secondary | ICD-10-CM | POA: Diagnosis not present

## 2022-12-05 DIAGNOSIS — Z5900 Homelessness unspecified: Secondary | ICD-10-CM | POA: Diagnosis not present

## 2022-12-05 DIAGNOSIS — I249 Acute ischemic heart disease, unspecified: Secondary | ICD-10-CM

## 2022-12-05 DIAGNOSIS — R531 Weakness: Secondary | ICD-10-CM | POA: Diagnosis not present

## 2022-12-05 NOTE — Plan of Care (Signed)

## 2022-12-05 NOTE — Progress Notes (Signed)
Mobility Specialist Progress Note:   12/05/22 1045  Mobility  Activity Transferred from chair to bed  Level of Assistance Contact guard assist, steadying assist  Assistive Device Four wheel walker  Distance Ambulated (ft) 3 ft  Activity Response Tolerated well  Mobility Referral Yes  $Mobility charge 1 Mobility  Mobility Specialist Start Time (ACUTE ONLY) 1045  Mobility Specialist Stop Time (ACUTE ONLY) 1059  Mobility Specialist Time Calculation (min) (ACUTE ONLY) 14 min   Pt received post-OT session. States he feels "overheated and wore out". Agreeable to attempt ambulation. Once pt stood up, c/o dizziness. Rolled back to bedside per rollator, BP 99/72(82). Pt declined further mobility, back in bed with all needs met. Alarm on.  Addison Lank Mobility Specialist Please contact via SecureChat or  Rehab office at [phone]

## 2022-12-05 NOTE — TOC Transition Note (Signed)
Transition of Care [facility]) - CM/SW Discharge Note   Patient Details  Name: Craig Schmidt MRN: [id number] Date of Birth: Mar 08, 1971  [facility]) CM/SW Contact:  Tom-Johnson, Hershal Coria, RN Phone Number: 12/05/2022, 3:36 PM   Clinical Narrative:    Patient is scheduled for discharge today.  Readmission Risk Assessment done. Outpatient referral, hospital f/u and discharge instructions on AVS. Rollator recommended, patient's insurance does not cover rollator per [employer] and patient paid $120 out of pocket which was delivered to him at bedside. Patient is homeless and declines going to a shelter or [facility]. Requests going to [address] [place] which is [place].   Patient requests a cab voucher.  Therapist, nutritional and Release Of Liability form explained to patient with understanding verbalized, form signed and placed in patient's chart. Cab voucher given to RN. Clothes given to patient per his request.  Prescriptions sent to [facility] pharmacy and meds will be delivered to patient at bedside prior discharge. No further TOC needs noted.   Final next level of care: OP Rehab (Patient is homeless and declined to go to any shelter or [facility]. Wants to go to [address] which is a [place] address.) Barriers to Discharge: Barriers Resolved   Patient Goals and CMS Choice CMS Medicare.gov Compare Post Acute Care list provided to:: Patient Choice offered to / list presented to : Patient  Discharge Placement                  Patient to be transferred to facility by: [facility]      Discharge Plan and Services Additional resources added to the After Visit Summary for                  DME Arranged: Walker rolling with seat DME Agency: [employer] Date DME Agency Contacted: 12/05/22 Time DME Agency Contacted: 1046 Representative spoke with at DME Agency: Ledell Noss HH Arranged: NA HH Agency: NA        Social Determinants of Health (SDOH)  Interventions SDOH Screenings   Food Insecurity: Food Insecurity Present (11/28/2022)  Housing: Medium Risk (11/28/2022)  Transportation Needs: Unmet Transportation Needs (11/28/2022)  Utilities: At Risk (11/28/2022)  Alcohol Screen: High Risk (09/19/2021)  Depression (PHQ2-9): High Risk (06/13/2022)  Financial Resource Strain: High Risk (09/07/2022)  Stress: Stress Concern Present (10/16/2022)  Tobacco Use: High Risk (11/28/2022)     Readmission Risk Interventions     No data to display

## 2022-12-05 NOTE — Progress Notes (Signed)
Patient discharged to home via wheelchair by NT and RN at 1545 on 12/05/22 at 1545. All belongings sent with patient.

## 2022-12-05 NOTE — Progress Notes (Signed)
Rounding Note    Patient Name: Craig Schmidt Date of Encounter: 12/05/2022  [facility] Cardiologist: [name], MD   Subjective   No acute events overnight. Working with social work team to arrange for temporary hotel due to homelessness. He is motivated to take medications and avoid rehospitalization.  Inpatient Medications    Scheduled Meds:  amoxicillin-clavulanate  1 tablet Oral Q12H   apixaban  5 mg Oral BID   atorvastatin  40 mg Oral Daily   carvedilol  3.125 mg Oral BID   clopidogrel  75 mg Oral Daily   docusate sodium  100 mg Oral BID   empagliflozin  10 mg Oral Daily   feeding supplement  237 mL Oral BID BM   folic acid  1 mg Oral Daily   isosorbide-hydrALAZINE  1 tablet Oral TID   lidocaine  1 patch Transdermal Q24H   multivitamin with minerals  1 tablet Oral Daily   nicotine  21 mg Transdermal Daily   sodium chloride flush  3 mL Intravenous Q12H   thiamine  100 mg Oral Daily   Or   thiamine  100 mg Intravenous Daily   Continuous Infusions:  PRN Meds: acetaminophen **OR** acetaminophen, albuterol, bisacodyl, hydrALAZINE, morphine injection, ondansetron **OR** ondansetron (ZOFRAN) IV, polyethylene glycol, polyvinyl alcohol   Vital Signs    Vitals:   12/04/22 2001 12/05/22 0026 12/05/22 0459 12/05/22 0739  BP: 127/89 107/78 121/83 110/79  Pulse: (!) 102 99 90   Resp: 18 18 18 17   Temp: 98.2 F (36.8 C) 98.2 F (36.8 C) 97.6 F (36.4 C) 97.8 F (36.6 C)  TempSrc: Oral Oral Oral   SpO2: 100% 100% 100%   Weight:   62.7 kg   Height:        Intake/Output Summary (Last 24 hours) at 12/05/2022 1051 Last data filed at 12/05/2022 0505 Gross per 24 hour  Intake 240 ml  Output 800 ml  Net -560 ml      12/05/2022    4:59 AM 12/04/2022    4:53 AM 12/03/2022    4:39 AM  Last 3 Weights  Weight (lbs) 138 lb 3.7 oz 136 lb 1.6 oz 134 lb 12.8 oz  Weight (kg) 62.7 kg 61.735 kg 61.145 kg      Telemetry    SR- Personally Reviewed  ECG     No new - Personally Reviewed  Physical Exam   GEN: Well nourished, well developed in no acute distress NECK: No JVD CARDIAC: regular rhythm, normal S1 and S2, no rubs or gallops. No murmur. VASCULAR: Radial pulses 2+ bilaterally.  RESPIRATORY:  Clear to auscultation without rales, wheezing or rhonchi  ABDOMEN: Soft, non-tender, non-distended MUSCULOSKELETAL:  Moves all 4 limbs independently SKIN: Warm and dry, no edema NEUROLOGIC:  No focal neuro deficits noted. PSYCHIATRIC:  Normal affect    Labs    High Sensitivity Troponin:   Recent Labs  Lab 11/28/22 1348 11/28/22 1743 11/28/22 2238  TROPONINIHS 175* 311* 267*     Chemistry Recent Labs  Lab 11/28/22 1155 11/30/22 0931 12/01/22 0053 12/02/22 0033 12/03/22 0039 12/04/22 0047  NA 134* 132*   < > 132* 129* 132*  K 3.5 3.7   < > 4.1 3.8 4.5  CL 106 90*   < > 91* 92* 98  CO2 18* 32   < > 28 25 23   GLUCOSE 107* 100*   < > 118* 193* 107*  BUN 17 21*   < > 29* 32*  36*  CREATININE 1.69* 1.77*   < > 1.95* 2.16* 2.02*  CALCIUM 7.6* 8.6*   < > 8.7* 8.7* 9.0  PROT 5.0* 6.1*  --   --   --   --   ALBUMIN 2.3* 2.2*  --   --   --   --   AST 35 17  --   --   --   --   ALT 14 12  --   --   --   --   ALKPHOS 81 83  --   --   --   --   BILITOT 1.0 1.1  --   --   --   --   GFRNONAA 49* 46*   < > 41* 36* 39*  ANIONGAP 10 10   < > 13 12 11    < > = values in this interval not displayed.    Lipids  Recent Labs  Lab 11/28/22 1743  CHOL 118  TRIG 60  HDL 27*  LDLCALC 79  CHOLHDL 4.4    Hematology Recent Labs  Lab 12/02/22 0033 12/03/22 0039 12/04/22 0047  WBC 11.1* 10.6* 9.6  RBC 4.31 4.52 4.54  HGB 13.1 13.7 13.7  HCT 39.1 41.6 41.8  MCV 90.7 92.0 92.1  MCH 30.4 30.3 30.2  MCHC 33.5 32.9 32.8  RDW 13.5 13.6 13.7  PLT 383 449* 508*   Thyroid  Recent Labs  Lab 11/28/22 1743  TSH 1.196    BNP Recent Labs  Lab 11/28/22 1152  BNP 2,810.5*    DDimer No results for input(s): "DDIMER" in the last 168  hours.   Radiology    No results found.  Cardiac Studies   Echo 11/29/22  1. Left ventricular ejection fraction, by estimation, is 25 to 30%. The  left ventricle has severely decreased function. The left ventricle has no  regional wall motion abnormalities. The left ventricular internal cavity  size was moderately dilated. Left  ventricular diastolic parameters are indeterminate. Elevated left atrial  pressure.   2. Right ventricular systolic function is mildly reduced. The right  ventricular size is mildly enlarged. There is normal pulmonary artery  systolic pressure. The estimated right ventricular systolic pressure is  34.8 mmHg.   3. Left atrial size was mild to moderately dilated.   4. Right atrial size was mildly dilated.   5. The mitral valve is abnormally thickened with restricted leaflet  motion. The mitral valve leaflets appear to attach to one papillary muscle  consistent with parachute mitral valve. There is mild to moderate mitral  stenosis with MV mean gradient  at HR 105bpm. The mitral valve is abnormal. Mild mitral valve  regurgitation. The mean mitral valve gradient is 8.0 mmHg.   6. The aortic valve is tricuspid. There is mild calcification of the  aortic valve. There is mild thickening of the aortic valve. Aortic valve  regurgitation is trivial. Aortic valve sclerosis/calcification is present,  without any evidence of aortic  stenosis.   7. The inferior vena cava is normal in size with greater than 50%  respiratory variability, suggesting right atrial pressure of 3 mmHg.   Comparison(s): No significant change from prior study.   Patient Profile     52 y.o. male with PMH chronic systolic and diastolic heart failure, ischemic cardiomyopathy, CAD with inferolateral STEMI 11/2021, mild-moderate mitral stenosis, complex social situation. We are consulted for management of heart failure.  Assessment & Plan    Acute on chronic combined systolic and diastolic  heart failure -EF 25-30% -admission weight 68.5 kg, current weight 62.7 kg. Charted net negative 10L but suspect this may be inaccurate -sodium, Cr improved with holding lasix. Appears euvolemic. Ok to restart oral lasix at discharge -continue isosorbide-hydralazine.  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   [facility] will sign off.   Medication Recommendations:  continue current doses of apixaban, atorvastatin, carvedilol, clopidogrel, empagliflozin, furosemide, hydralazine-isosorbide Do not restart digoxin, torsemide, spironolactone, or entresto (old medications on list) Other recommendations (labs, testing, etc):  bmet as outpatient Follow up as an outpatient:  Has follow up on 12/11/22 with the heart failure clinic  For questions or updates, please contact [facility] Please consult [email or web address] for contact info under     Signed, Jodelle Red, MD  12/05/2022, 10:51 AM

## 2022-12-05 NOTE — Progress Notes (Signed)
Occupational Therapy Treatment Patient Details Name: Craig Schmidt MRN: [id number] DOB: [birth year] Today's Date: 12/05/2022   History of present illness Pt is a 52 year old male admitted on 11/28/22 for chest pain and SOB. Past medical history significant of asthma, CAD, CVA, chronic combined CHF (ER 30-35% and grade 2 DD in 03/2022), and brain tumor   OT comments  Pt making slow progress towards goals, limited by residual RUE deficits and decreased activity tolerance. Pt provided with blue foam cube for R hand strengthening, pt able to demo. Pt needing set up - min A for ADLs, min guard for bed mobility,and min guard for transfers with rollator. Pt able to stand at sink for grooming task x5 min, however needing extended sitting break (~10 min) on rollator to recover, HR in 110's. Continued education on energy conservation strategies and pt verbalized understanding. Pt presenting with impairments listed below, will follow acutely. Continue to recommend OP OT at d/c.    Recommendations for follow up therapy are one component of a multi-disciplinary discharge planning process, led by the attending physician.  Recommendations may be updated based on patient status, additional functional criteria and insurance authorization.    Assistance Recommended at Discharge Intermittent Supervision/Assistance  Patient can return home with the following  A lot of help with bathing/dressing/bathroom;Assistance with cooking/housework;Assist for transportation;A little help with walking and/or transfers   Equipment Recommendations  Other (comment);BSC/3in1 (rollator)    Recommendations for Other Services PT consult    Precautions / Restrictions Precautions Precautions: Fall Restrictions Weight Bearing Restrictions: No       Mobility Bed Mobility Overal bed mobility: Needs Assistance       Supine to sit: Min guard     General bed mobility comments: increased time    Transfers Overall transfer  level: Needs assistance Equipment used: Rollator (4 wheels) Transfers: Sit to/from Stand Sit to Stand: Min guard           General transfer comment: pt able to lock brakes of rollator, identifies when fatigued/needs to sit     Balance Overall balance assessment: Needs assistance Sitting-balance support: Feet supported Sitting balance-Leahy Scale: Good     Standing balance support: Bilateral upper extremity supported, During functional activity Standing balance-Leahy Scale: Fair Standing balance comment: stands statically without external support                           ADL either performed or assessed with clinical judgement   ADL Overall ADL's : Needs assistance/impaired Eating/Feeding: Set up;Sitting Eating/Feeding Details (indicate cue type and reason): feeds self with LUE Grooming: Minimal assistance;Standing;Wash/dry face;Oral care Grooming Details (indicate cue type and reason): standing for ~5 min then needing extended time sitting to rest afterward         Upper Body Dressing : Minimal assistance;Sitting Upper Body Dressing Details (indicate cue type and reason): donning gown on backside     Toilet Transfer: Min guard;Rollator (4 wheels);Ambulation;Regular Teacher, adult education Details (indicate cue type and reason): simulated via functional mobility         Functional mobility during ADLs: Min guard;Rollator (4 wheels)      Extremity/Trunk Assessment Upper Extremity Assessment Upper Extremity Assessment: Generalized weakness RUE Deficits / Details: RUE with shoulder subluxation, can flex/ext elbow activelty with increased time, although keeps it gaurded. Pt   Lower Extremity Assessment Lower Extremity Assessment: Defer to PT evaluation        Vision   Vision Assessment?: No  apparent visual deficits   Perception Perception Perception: Not tested   Praxis Praxis Praxis: Not tested    Cognition Arousal/Alertness:  Awake/alert Behavior During Therapy: WFL for tasks assessed/performed Overall Cognitive Status: Within Functional Limits for tasks assessed                                 General Comments: Pt following one step commands with increased time        Exercises Other Exercises Other Exercises: blue foam cube squeeze RUE x5    Shoulder Instructions       General Comments HR up to 110's with mobility    Pertinent Vitals/ Pain       Pain Assessment Pain Assessment: No/denies pain  Home Living                                          Prior Functioning/Environment              Frequency  Min 1X/week        Progress Toward Goals  OT Goals(current goals can now be found in the care plan section)  Progress towards OT goals: Progressing toward goals  Acute Rehab OT Goals Patient Stated Goal: none stated OT Goal Formulation: With patient Time For Goal Achievement: 12/13/22 Potential to Achieve Goals: Good ADL Goals Pt Will Perform Grooming: standing;with set-up;with supervision Pt Will Perform Upper Body Dressing: sitting;with set-up;with supervision Pt Will Perform Lower Body Dressing: with min assist;sitting/lateral leans Pt Will Transfer to Toilet: with min guard assist;ambulating Pt/caregiver will Perform Home Exercise Program: Increased ROM;Both right and left upper extremity;With written HEP provided;With Supervision  Plan Frequency remains appropriate;Discharge plan needs to be updated    Co-evaluation                 AM-PAC OT "6 Clicks" Daily Activity     Outcome Measure   Help from another person eating meals?: None Help from another person taking care of personal grooming?: A Little Help from another person toileting, which includes using toliet, bedpan, or urinal?: A Little Help from another person bathing (including washing, rinsing, drying)?: None Help from another person to put on and taking off regular upper  body clothing?: A Little Help from another person to put on and taking off regular lower body clothing?: A Lot 6 Click Score: 19    End of Session Equipment Utilized During Treatment: Gait belt;Rollator (4 wheels)  OT Visit Diagnosis: Unsteadiness on feet (R26.81);Other abnormalities of gait and mobility (R26.89);History of falling (Z91.81);Pain   Activity Tolerance Patient limited by fatigue   Patient Left Other (comment) (handoff at MT seated on rollator, MT present)   Nurse Communication Mobility status (HR)        Time: 1610-9604 OT Time Calculation (min): 32 min  Charges: OT General Charges $OT Visit: 1 Visit OT Treatments $Self Care/Home Management : 23-37 mins  Carver Fila, OTD, OTR/L SecureChat Preferred Acute Rehab [phone]   Carver Fila Koonce 12/05/2022, 10:56 AM

## 2022-12-05 NOTE — Progress Notes (Signed)
Patient is feeling well, he is medically stable for discharge. Social work has been consulted to assist in discharge disposition, he is homeless.   BP 110/79 (BP Location: Left Arm)   Pulse 90   Temp 97.8 F (36.6 C)   Resp 17   Ht 5\' 7"  (1.702 m)   Wt 62.7 kg   SpO2 100%   BMI 21.65 kg/m   Neurology awake and alert ENT with no pallor Cardiovascular with no JVD. Heart with S1 and S2 present and rhythmic with no gallops, rubs or murmurs Respiratory with no rales or wheezing Abdomen with no distention  No lower extremity edema  Plan for discharge home today.  Medications from [facility] pharmacy. Follow up with cardiology as outpatient.

## 2022-12-05 NOTE — Progress Notes (Signed)
Pt is waiting on clothes,TOC meds, Rolator and transportation(taxi).

## 2022-12-06 ENCOUNTER — Telehealth: Payer: Self-pay | Admitting: *Deleted

## 2022-12-06 NOTE — Transitions of Care (Post Inpatient/ED Visit) (Signed)
   12/06/2022  Name: Craig Schmidt MRN: [id number] DOB: 01/22/1971  Today's TOC FU Call Status: Today's TOC FU Call Status:: Unsuccessul Call (1st Attempt) Unsuccessful Call (1st Attempt) Date: 12/06/22  Attempted to reach the patient regarding the most recent Inpatient/ED visit.  Follow Up Plan: Additional outreach attempts will be made to reach the patient to complete the Transitions of Care (Post Inpatient/ED visit) call.   Estanislado Emms RN, BSN [place]  Managed [facility] RN Care Coordinator [phone]

## 2022-12-10 ENCOUNTER — Telehealth: Payer: Self-pay

## 2022-12-10 ENCOUNTER — Telehealth (HOSPITAL_COMMUNITY): Payer: Self-pay | Admitting: Licensed Clinical Social Worker

## 2022-12-10 ENCOUNTER — Telehealth (HOSPITAL_COMMUNITY): Payer: Self-pay

## 2022-12-10 NOTE — Transitions of Care (Post Inpatient/ED Visit) (Signed)
12/10/2022  Name: Craig Schmidt MRN: [id number] DOB: 12/07/1970  Today's TOC FU Call Status: Today's TOC FU Call Status:: Unsuccessul Call (1st Attempt) Unsuccessful Call (1st Attempt) Date: 12/06/22 [facility] FU Call Complete Date: 12/10/22  Transition Care Management Follow-up Telephone Call Date of Discharge: 12/05/22 Discharge Facility: [facility]) Type of Discharge: Inpatient Admission Primary Inpatient Discharge Diagnosis:: acute on chronic CHF How have you been since you were released from the hospital?: Better Any questions or concerns?: No  Items Reviewed: Did you receive and understand the discharge instructions provided?: Yes Medications obtained,verified, and reconciled?: No Medications Not Reviewed Reasons:: Advised Patient to Call Provider Office (He said that he has all of the medications and the med list but the list is in " alot of stuff" and he was not able to get it.  I reminded him that he can call PCP office if he has any questions after he reviews the list again.) Any new allergies since your discharge?: No Dietary orders reviewed?: No Do you have support at home?: Yes People in Home: alone Name of Support/Comfort Primary Source: He is currently homeless and living in a tent. He feels its too dangerous to go to [facility] or a shelter.  When asked if he has contacted the [facility] for assistance with securing permanent housing,he said he would rather do it himself.  Medications Reviewed Today:the patient did not have his list with him when I called and he said he was not able to get it for review Medications Reviewed Today     Reviewed by Shiela Mayer, CPhT (Pharmacy Technician) on 11/29/22 at 1254  Med List Status: Complete   Medication Order Taking? Sig Documenting Provider Last Dose Status Informant  apixaban (ELIQUIS) 5 MG TABS tablet [id number] No Take 1 tablet (5 mg total) by mouth 2 (two) times daily.  Patient not taking: Reported on 11/29/2022   Tonye Becket  D, NP Not Taking Active Self  atorvastatin (LIPITOR) 40 MG tablet [id number] No TAKE 1 TABLET(40 MG) BY MOUTH DAILY  Patient not taking: Reported on 11/29/2022   Laurey Morale, MD Not Taking Active   carvedilol (COREG) 3.125 MG tablet [id number] No Take 1 tablet (3.125 mg total) by mouth 2 (two) times daily.  Patient not taking: Reported on 11/29/2022   Allayne Butcher, [name] Not Taking Active   clopidogrel (PLAVIX) 75 MG tablet [id number] No TAKE ONE TABLET BY MOUTH ONCE DAILY  Patient not taking: Reported on 11/29/2022   Tonye Becket D, NP Not Taking Active   digoxin (LANOXIN) 0.125 MG tablet [id number] No TAKE ONE TABLET BY MOUTH ONCE DAILY  Patient not taking: Reported on 11/29/2022   Tonye Becket D, NP Not Taking Active   famotidine (PEPCID) 20 MG tablet [id number] No TAKE ONE TABLET BY MOUTH ONCE DAILY  Patient not taking: Reported on 11/29/2022   Tonye Becket D, NP Not Taking Active   Glycerin-Hypromellose-PEG 400 0.2-0.2-1 % SOLN [id number] No Place 1 drop into both eyes as needed.  Patient not taking: Reported on 11/29/2022   Tonye Becket D, NP Not Taking Active Self  isosorbide-hydrALAZINE (BIDIL) 20-37.5 MG tablet [id number] No Take 0.5 tablets by mouth 3 (three) times daily. Take 1 & 1/2 tablets by mouth 3 times daily.  Patient not taking: Reported on 11/29/2022   Jacklynn Ganong, [name] Not Taking Active   JARDIANCE 10 MG TABS tablet [id number] No TAKE ONE TABLET BY MOUTH ONCE DAILY  Patient not taking: Reported on 11/29/2022  Tonye Becket D, NP Not Taking Active   lamoTRIgine (LAMICTAL) 25 MG tablet [id number] No Take 1 tablet (25 mg total) by mouth daily for 14 days. Then increase to 50 mg daily.  Patient not taking: Reported on 11/29/2022   Lamar Sprinkles, MD Not Taking Active   lamoTRIgine (LAMICTAL) 25 MG tablet [id number] No Take 2 tablets (50 mg total) by mouth daily for 14 days.  Patient not taking: Reported on 11/29/2022   Lamar Sprinkles, MD Not Taking Active   potassium chloride SA (KLOR-CON  M) 20 MEQ tablet [id number] No Take 1 tablet (20 mEq total) by mouth daily.  Patient not taking: Reported on 11/29/2022   Jacklynn Ganong, [name] Not Taking Active   sacubitril-valsartan (ENTRESTO) 24-26 MG [id number] No Take 1 tablet by mouth 2 (two) times daily.  Patient not taking: Reported on 11/29/2022   Jacklynn Ganong, [name] Not Taking Active   spironolactone (ALDACTONE) 25 MG tablet [id number] No Take 0.5 tablets (12.5 mg total) by mouth at bedtime.  Patient not taking: Reported on 11/29/2022   Allayne Butcher, [name] Not Taking Active   torsemide (DEMADEX) 20 MG tablet [id number] No Take 1 tablet (20 mg total) by mouth daily.  Patient not taking: Reported on 11/29/2022   Jacklynn Ganong, [name] Not Taking Active   triamcinolone ointment (KENALOG) 0.5 % [id number] No APPLY TOPICALLY TO THE AFFECTED AREA TWICE DAILY  Patient not taking: Reported on 11/29/2022   Rema Fendt, NP Not Taking Active             Home Care and Equipment/Supplies: Were Home Health Services Ordered?: No Any new equipment or medical supplies ordered?: Yes Name of Medical supply agency?: [employer] - rollator Were you able to get the equipment/medical supplies?: Yes Do you have any questions related to the use of the equipment/supplies?: No  Functional Questionnaire: Do you need assistance with bathing/showering or dressing?: No Do you need assistance with meal preparation?: No (homeless) Do you need assistance with eating?: No Do you have difficulty maintaining continence: No Do you need assistance with getting out of bed/getting out of a chair/moving?: Yes (He has the rollator and a cane and said he usually uses the cane) Do you have difficulty managing or taking your medications?: No  Follow up appointments reviewed: PCP Follow-up appointment confirmed?: Yes Date of PCP follow-up appointment?: 12/18/22 Follow-up Provider: Ricky Stabs, NP Specialist Hospital Follow-up appointment confirmed?:  Yes Date of Specialist follow-up appointment?: 12/11/22 Follow-Up Specialty Provider:: cardiology, then 12/19/2022 - outpatient PT and OT Do you need transportation to your follow-up appointment?: No (He said he will use the bus for his therapy appointments and will call [employer] to arrange transportation to PCP appointment.) Do you understand care options if your condition(s) worsen?: Yes-patient verbalized understanding    SIGNATURE  Robyne Peers, RN

## 2022-12-10 NOTE — Telephone Encounter (Signed)
Called and left patient a voice message to confirm/remind patient of their appointment at [facility] on 12/11/22.    Craig Schmidt

## 2022-12-10 NOTE — Telephone Encounter (Signed)
H&V Care Navigation CSW Progress Note  Clinical Social Worker called pt to remind of appt tomorrow and to inquire if he needs transportation assistance.  Unable to reach- left VM requesting return call  Patient is participating in a Managed Medicaid Plan:  Yes  SDOH Screenings   Food Insecurity: Food Insecurity Present (11/28/2022)  Housing: Medium Risk (11/28/2022)  Transportation Needs: Unmet Transportation Needs (11/28/2022)  Utilities: At Risk (11/28/2022)  Alcohol Screen: High Risk (09/19/2021)  Depression (PHQ2-9): High Risk (06/13/2022)  Financial Resource Strain: High Risk (09/07/2022)  Stress: Stress Concern Present (10/16/2022)  Tobacco Use: High Risk (11/28/2022)     Burna Sis, LCSW Clinical Social Worker [facility] Desk#: [phone] Cell#: [phone]

## 2022-12-11 ENCOUNTER — Telehealth (HOSPITAL_COMMUNITY): Payer: Self-pay | Admitting: Licensed Clinical Social Worker

## 2022-12-11 ENCOUNTER — Encounter (HOSPITAL_COMMUNITY): Payer: Medicaid Other

## 2022-12-11 DIAGNOSIS — F331 Major depressive disorder, recurrent, moderate: Secondary | ICD-10-CM | POA: Diagnosis not present

## 2022-12-11 NOTE — Telephone Encounter (Signed)
CSW contacted patient to remind of clinic appointment today and offer transportation if needed. Patient states he was going to call to cancel as he is having some difficulties with the police who are threatening to take his stuff from his homeless camp. Patient states that he also has not had a bath in a few days and is embarrassed to come to clinic "smelling". CSW suggested that he go to the [facility] where he could get a bath and transport could be offered from there to clinic today. Patient states "it's dangerous at the [facility] with gangs and stuff. I will just take my chances on the street". Patient requested to reschedule his appointment for today. CSW assisted with front office assistance to reschedule patient for June 11. Patient aware to call for transport if needed. CSW continues to be available as needed. Lasandra Beech, LCSW, CCSW-MCS [phone]

## 2022-12-11 NOTE — Progress Notes (Signed)
Erroneous encounter-disregard

## 2022-12-18 ENCOUNTER — Encounter: Payer: Medicaid Other | Admitting: Family

## 2022-12-18 DIAGNOSIS — Z91148 Patient's other noncompliance with medication regimen for other reason: Secondary | ICD-10-CM

## 2022-12-18 DIAGNOSIS — Z5900 Homelessness unspecified: Secondary | ICD-10-CM

## 2022-12-18 DIAGNOSIS — N1832 Chronic kidney disease, stage 3b: Secondary | ICD-10-CM

## 2022-12-18 DIAGNOSIS — F1410 Cocaine abuse, uncomplicated: Secondary | ICD-10-CM

## 2022-12-18 DIAGNOSIS — J690 Pneumonitis due to inhalation of food and vomit: Secondary | ICD-10-CM

## 2022-12-18 DIAGNOSIS — Z09 Encounter for follow-up examination after completed treatment for conditions other than malignant neoplasm: Secondary | ICD-10-CM

## 2022-12-18 DIAGNOSIS — D333 Benign neoplasm of cranial nerves: Secondary | ICD-10-CM

## 2022-12-18 DIAGNOSIS — F331 Major depressive disorder, recurrent, moderate: Secondary | ICD-10-CM | POA: Diagnosis not present

## 2022-12-18 DIAGNOSIS — F1010 Alcohol abuse, uncomplicated: Secondary | ICD-10-CM

## 2022-12-18 DIAGNOSIS — R5383 Other fatigue: Secondary | ICD-10-CM

## 2022-12-18 DIAGNOSIS — Z8679 Personal history of other diseases of the circulatory system: Secondary | ICD-10-CM

## 2022-12-18 DIAGNOSIS — I2489 Other forms of acute ischemic heart disease: Secondary | ICD-10-CM

## 2022-12-18 DIAGNOSIS — I5043 Acute on chronic combined systolic (congestive) and diastolic (congestive) heart failure: Secondary | ICD-10-CM

## 2022-12-18 DIAGNOSIS — F1910 Other psychoactive substance abuse, uncomplicated: Secondary | ICD-10-CM

## 2022-12-18 DIAGNOSIS — Z8673 Personal history of transient ischemic attack (TIA), and cerebral infarction without residual deficits: Secondary | ICD-10-CM

## 2022-12-18 DIAGNOSIS — G9340 Encephalopathy, unspecified: Secondary | ICD-10-CM

## 2022-12-19 ENCOUNTER — Ambulatory Visit: Payer: Medicaid Other

## 2022-12-19 ENCOUNTER — Ambulatory Visit: Payer: Medicaid Other | Admitting: Physical Therapy

## 2022-12-20 DIAGNOSIS — F331 Major depressive disorder, recurrent, moderate: Secondary | ICD-10-CM | POA: Diagnosis not present

## 2022-12-27 ENCOUNTER — Ambulatory Visit: Payer: Medicaid Other | Admitting: Physical Therapy

## 2022-12-27 ENCOUNTER — Ambulatory Visit: Payer: Medicaid Other | Admitting: Occupational Therapy

## 2022-12-31 ENCOUNTER — Telehealth (HOSPITAL_COMMUNITY): Payer: Self-pay

## 2022-12-31 NOTE — Telephone Encounter (Signed)
Called to confirm/remind patient of their appointment at [facility] on 01/01/23.   Patient reminded to bring all medications and/or complete list.  Confirmed patient has transportation. Gave directions, instructed to utilize valet parking.  Confirmed appointment prior to ending call.

## 2023-01-01 ENCOUNTER — Encounter (HOSPITAL_COMMUNITY): Payer: Medicaid Other

## 2023-01-03 ENCOUNTER — Telehealth (HOSPITAL_COMMUNITY): Payer: Self-pay | Admitting: Licensed Clinical Social Worker

## 2023-01-03 NOTE — Telephone Encounter (Signed)
H&V Care Navigation CSW Progress Note  Clinical Social Worker received call from pt requesting help getting rescheduled with clinic as he missed his appt on Tuesday.  CSW assisted in connecting to scheduler and having him get new appt- texted him the appt details.  Patient is participating in a Managed Medicaid Plan:  Yes  SDOH Screenings   Food Insecurity: Food Insecurity Present (11/28/2022)  Housing: Medium Risk (11/28/2022)  Transportation Needs: Unmet Transportation Needs (11/28/2022)  Utilities: At Risk (11/28/2022)  Alcohol Screen: High Risk (09/19/2021)  Depression (PHQ2-9): High Risk (06/13/2022)  Financial Resource Strain: High Risk (09/07/2022)  Stress: Stress Concern Present (10/16/2022)  Tobacco Use: High Risk (11/28/2022)     Burna Sis, LCSW Clinical Social Worker [facility] Desk#: [phone] Cell#: [phone]

## 2023-01-04 ENCOUNTER — Other Ambulatory Visit (HOSPITAL_COMMUNITY): Payer: Self-pay

## 2023-01-11 ENCOUNTER — Telehealth: Payer: Self-pay | Admitting: *Deleted

## 2023-01-11 NOTE — Patient Outreach (Signed)
  Medicaid Managed Care   Unsuccessful Outreach Note  01/11/2023 Name: Craig Schmidt MRN: [id number] DOB: 1970-10-22  Referred by: Rema Fendt, NP Reason for referral : High Risk Managed Medicaid (Unsuccessful RNCM telephone outreach)   Third unsuccessful telephone outreach was attempted today. The patient was referred to the case management team for assistance with care management and care coordination. The patient's primary care provider has been notified of our unsuccessful attempts to make or maintain contact with the patient. The care management team is pleased to engage with this patient at any time in the future should he/she be interested in assistance from the care management team.   Follow Up Plan: We have been unable to make contact with the patient for follow up. The care management team is available to follow up with the patient after provider conversation with the patient regarding recommendation for care management engagement and subsequent re-referral to the care management team.   Estanislado Emms RN, BSN [place]  [facility] RN Care Coordinator [phone]

## 2023-01-17 ENCOUNTER — Telehealth (HOSPITAL_COMMUNITY): Payer: Self-pay | Admitting: Licensed Clinical Social Worker

## 2023-01-17 ENCOUNTER — Telehealth (HOSPITAL_COMMUNITY): Payer: Self-pay

## 2023-01-17 NOTE — Transportation (Signed)
H&V Care Navigation CSW Progress Note  Clinical Social Worker received return call from pt who reports he does not have ride to appt tomorrow.  CSW able to set up taxi to pick up pt at [employer] as he is homeless at this time.  Confirms that is where he will need to return when appt is done.  Patient is participating in a Managed Medicaid Plan:  Yes  SDOH Screenings   Food Insecurity: Food Insecurity Present (11/28/2022)  Housing: Medium Risk (11/28/2022)  Transportation Needs: Unmet Transportation Needs (01/17/2023)  Utilities: At Risk (11/28/2022)  Alcohol Screen: High Risk (09/19/2021)  Depression (PHQ2-9): High Risk (06/13/2022)  Financial Resource Strain: High Risk (09/07/2022)  Stress: Stress Concern Present (10/16/2022)  Tobacco Use: High Risk (11/28/2022)     Burna Sis, LCSW Clinical Social Worker [facility] Desk#: [phone] Cell#: [phone]

## 2023-01-17 NOTE — Telephone Encounter (Signed)
Called to confirm/remind patient of their appointment at [facility] on 01/18/23.   Patient reminded to bring all medications and/or complete list.  Confirmed patient has transportation. Gave directions, instructed to utilize valet parking.  Confirmed appointment prior to ending call.   

## 2023-01-17 NOTE — Telephone Encounter (Signed)
CSW called pt to remind of appt tomorrow and to ensure if he has transportation.  Unable to reach- left VM requesting return call  Burna Sis, LCSW Clinical Social Worker [facility] Desk#: [phone] Cell#: [phone]

## 2023-01-18 ENCOUNTER — Encounter (HOSPITAL_COMMUNITY): Payer: Medicaid Other

## 2023-01-18 ENCOUNTER — Telehealth: Payer: Self-pay | Admitting: Licensed Clinical Social Worker

## 2023-01-18 NOTE — Telephone Encounter (Signed)
H&V Care Navigation CSW Progress Note  Clinical Social Worker contacted patient by phone to f/u on ride arranged by [place], reminder it will pick him up at the [employer] today. I am available as needed.  Patient is participating in a Managed Medicaid Plan:  Yes  SDOH Screenings   Food Insecurity: Food Insecurity Present (11/28/2022)  Housing: Medium Risk (11/28/2022)  Transportation Needs: Unmet Transportation Needs (01/17/2023)  Utilities: At Risk (11/28/2022)  Alcohol Screen: High Risk (09/19/2021)  Depression (PHQ2-9): High Risk (06/13/2022)  Financial Resource Strain: High Risk (09/07/2022)  Stress: Stress Concern Present (10/16/2022)  Tobacco Use: High Risk (11/28/2022)    Craig Schmidt, MSW, LCSW Clinical Social Worker [facility] Care Navigation  [phone]- work cell phone (preferred) [phone]- desk phone

## 2023-01-22 ENCOUNTER — Telehealth (HOSPITAL_COMMUNITY): Payer: Self-pay

## 2023-01-22 NOTE — Telephone Encounter (Signed)
Called and left patient a voice message to confirm/remind patient of their appointment at the [facility] on 01/23/23.   And to bring all medications and/or complete list.   

## 2023-01-23 ENCOUNTER — Other Ambulatory Visit (HOSPITAL_COMMUNITY): Payer: Self-pay

## 2023-01-23 ENCOUNTER — Ambulatory Visit (HOSPITAL_COMMUNITY)
Admission: RE | Admit: 2023-01-23 | Discharge: 2023-01-23 | Disposition: A | Discharge status: Home / Self Care | Payer: Medicaid Other | Source: Ambulatory Visit | Attending: Family Medicine | Admitting: Family Medicine

## 2023-01-23 ENCOUNTER — Encounter (HOSPITAL_COMMUNITY): Payer: Self-pay

## 2023-01-23 VITALS — BP 144/104 | HR 98 | Wt 162.0 lb

## 2023-01-23 DIAGNOSIS — I2510 Atherosclerotic heart disease of native coronary artery without angina pectoris: Secondary | ICD-10-CM

## 2023-01-23 DIAGNOSIS — N183 Chronic kidney disease, stage 3 unspecified: Secondary | ICD-10-CM | POA: Insufficient documentation

## 2023-01-23 DIAGNOSIS — I10 Essential (primary) hypertension: Secondary | ICD-10-CM

## 2023-01-23 DIAGNOSIS — Z5900 Homelessness unspecified: Secondary | ICD-10-CM | POA: Diagnosis not present

## 2023-01-23 DIAGNOSIS — Z79899 Other long term (current) drug therapy: Secondary | ICD-10-CM | POA: Diagnosis not present

## 2023-01-23 DIAGNOSIS — Z7901 Long term (current) use of anticoagulants: Secondary | ICD-10-CM | POA: Insufficient documentation

## 2023-01-23 DIAGNOSIS — F1010 Alcohol abuse, uncomplicated: Secondary | ICD-10-CM | POA: Diagnosis not present

## 2023-01-23 DIAGNOSIS — Z8673 Personal history of transient ischemic attack (TIA), and cerebral infarction without residual deficits: Secondary | ICD-10-CM | POA: Diagnosis not present

## 2023-01-23 DIAGNOSIS — I252 Old myocardial infarction: Secondary | ICD-10-CM | POA: Diagnosis not present

## 2023-01-23 DIAGNOSIS — Q232 Congenital mitral stenosis: Secondary | ICD-10-CM | POA: Diagnosis not present

## 2023-01-23 DIAGNOSIS — I429 Cardiomyopathy, unspecified: Secondary | ICD-10-CM | POA: Diagnosis not present

## 2023-01-23 DIAGNOSIS — Z91148 Patient's other noncompliance with medication regimen for other reason: Secondary | ICD-10-CM | POA: Diagnosis not present

## 2023-01-23 DIAGNOSIS — F1910 Other psychoactive substance abuse, uncomplicated: Secondary | ICD-10-CM

## 2023-01-23 DIAGNOSIS — Z7902 Long term (current) use of antithrombotics/antiplatelets: Secondary | ICD-10-CM | POA: Insufficient documentation

## 2023-01-23 DIAGNOSIS — I5022 Chronic systolic (congestive) heart failure: Secondary | ICD-10-CM | POA: Diagnosis not present

## 2023-01-23 DIAGNOSIS — Z9181 History of falling: Secondary | ICD-10-CM | POA: Insufficient documentation

## 2023-01-23 DIAGNOSIS — I69351 Hemiplegia and hemiparesis following cerebral infarction affecting right dominant side: Secondary | ICD-10-CM | POA: Diagnosis not present

## 2023-01-23 DIAGNOSIS — F17210 Nicotine dependence, cigarettes, uncomplicated: Secondary | ICD-10-CM | POA: Insufficient documentation

## 2023-01-23 DIAGNOSIS — I5023 Acute on chronic systolic (congestive) heart failure: Secondary | ICD-10-CM

## 2023-01-23 DIAGNOSIS — I050 Rheumatic mitral stenosis: Secondary | ICD-10-CM | POA: Diagnosis not present

## 2023-01-23 DIAGNOSIS — F1290 Cannabis use, unspecified, uncomplicated: Secondary | ICD-10-CM | POA: Insufficient documentation

## 2023-01-23 DIAGNOSIS — Z5986 Financial insecurity: Secondary | ICD-10-CM | POA: Diagnosis not present

## 2023-01-23 DIAGNOSIS — Z5982 Transportation insecurity: Secondary | ICD-10-CM | POA: Diagnosis not present

## 2023-01-23 DIAGNOSIS — D333 Benign neoplasm of cranial nerves: Secondary | ICD-10-CM

## 2023-01-23 DIAGNOSIS — I130 Hypertensive heart and chronic kidney disease with heart failure and stage 1 through stage 4 chronic kidney disease, or unspecified chronic kidney disease: Secondary | ICD-10-CM | POA: Insufficient documentation

## 2023-01-23 DIAGNOSIS — F1410 Cocaine abuse, uncomplicated: Secondary | ICD-10-CM | POA: Insufficient documentation

## 2023-01-23 LAB — COMPREHENSIVE METABOLIC PANEL
ALT: 15 U/L (ref 0–44)
AST: 40 U/L (ref 15–41)
Albumin: 2.9 g/dL — ABNORMAL LOW (ref 3.5–5.0)
Alkaline Phosphatase: 118 U/L (ref 38–126)
Anion gap: 9 (ref 5–15)
BUN: 22 mg/dL — ABNORMAL HIGH (ref 6–20)
CO2: 21 mmol/L — ABNORMAL LOW (ref 22–32)
Calcium: 8.9 mg/dL (ref 8.9–10.3)
Chloride: 108 mmol/L (ref 98–111)
Creatinine, Ser: 1.7 mg/dL — ABNORMAL HIGH (ref 0.61–1.24)
GFR, Estimated: 49 mL/min — ABNORMAL LOW (ref >60)
Glucose, Bld: 92 mg/dL (ref 70–99)
Potassium: 4.2 mmol/L (ref 3.5–5.1)
Sodium: 138 mmol/L (ref 135–145)
Total Bilirubin: 0.6 mg/dL (ref 0.3–1.2)
Total Protein: 6 g/dL — ABNORMAL LOW (ref 6.5–8.1)

## 2023-01-23 LAB — CBC
HCT: 35.3 % — ABNORMAL LOW (ref 39.0–52.0)
Hemoglobin: 11.1 g/dL — ABNORMAL LOW (ref 13.0–17.0)
MCH: 29.2 pg (ref 26.0–34.0)
MCHC: 31.4 g/dL (ref 30.0–36.0)
MCV: 92.9 fL (ref 80.0–100.0)
Platelets: 352 10*3/uL (ref 150–400)
RBC: 3.8 MIL/uL — ABNORMAL LOW (ref 4.22–5.81)
RDW: 15.6 % — ABNORMAL HIGH (ref 11.5–15.5)
WBC: 7 10*3/uL (ref 4.0–10.5)
nRBC: 0 % (ref 0.0–0.2)

## 2023-01-23 LAB — LACTIC ACID, PLASMA: Lactic Acid, Venous: 1.6 mmol/L (ref 0.5–1.9)

## 2023-01-23 LAB — BRAIN NATRIURETIC PEPTIDE: B Natriuretic Peptide: 2043.2 pg/mL — ABNORMAL HIGH (ref 0.0–100.0)

## 2023-01-23 MED ORDER — FUROSEMIDE 20 MG PO TABS
80.0000 mg | ORAL_TABLET | Freq: Every day | ORAL | 6 refills | Status: DC
  Filled 2023-01-23: qty 144, 36d supply, fill #0

## 2023-01-23 MED ORDER — POTASSIUM CHLORIDE CRYS ER 20 MEQ PO TBCR
40.0000 meq | EXTENDED_RELEASE_TABLET | Freq: Every day | ORAL | 6 refills | Status: DC
  Filled 2023-01-23: qty 36, 18d supply, fill #0

## 2023-01-23 MED ORDER — ISOSORB DINITRATE-HYDRALAZINE 20-37.5 MG PO TABS
0.5000 | ORAL_TABLET | Freq: Three times a day (TID) | ORAL | 6 refills | Status: DC
  Filled 2023-01-23: qty 45, 30d supply, fill #0
  Filled 2023-03-18 – 2023-04-25 (×2): qty 45, 30d supply, fill #1

## 2023-01-23 MED ORDER — METOLAZONE 2.5 MG PO TABS
2.5000 mg | ORAL_TABLET | ORAL | 0 refills | Status: DC
  Filled 2023-01-23: qty 3, 3d supply, fill #0

## 2023-01-23 MED ORDER — APIXABAN 5 MG PO TABS
5.0000 mg | ORAL_TABLET | Freq: Two times a day (BID) | ORAL | 6 refills | Status: DC
  Filled 2023-01-23 – 2023-01-31 (×2): qty 60, 30d supply, fill #0

## 2023-01-23 NOTE — Progress Notes (Signed)
-  [facility] Clinic Note   Primary Care: Rema Fendt, NP HF Cardiologist: Dr. Shirlee Latch  HPI: Craig Schmidt is a 52 y.o. 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   Admitted 02/23 with left MCA CVA s/p tenecteplase. Stroke felt to be likely secondary to cardiomyopathy. Echo EF 25-30%,k RV mildly reduced, RVSP 50 mmHg, moderate MS, mild to moderate MR, moderate to severe TR, dilated IVC. TEE 02/23: EF 25-30%, RV severely reduced, MV stenotic (? Parachute mitral valve), MVA 1.45 cm2 consistent with severe MS, moderate MR, no interatrial shunt, no LV or LAA thrombus. Cardiology consulted. CM likely d/t subtance abuse +/- uncontrolled HTN. Had been started on coumadin but later switched to eliquis d/t concern for compliance given his social situation.  Admitted 5/23 with acute inferolateral STEMI. Had used cocaine 24 hrs prior to presentation. Emergent LHC with 100% apical LAD suspected embolic vs plaque rupture with thrombosis. LVEDP 40. Echo this admit showed EF < 20%, ? RV okay. He was significantly volume overloaded and hypertensive. Started on lasix gtt and nitro gtt. Had good diuresis but Scr began to trend up, 1.5>1.77>1.87>2.0.  Developed soft BP and BP-active meds held. There was concern for low-output. PICC placed and Co-ox within normal range. Drips weaned and GDMT started. He was discharged home, weight 156 lbs.  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   R/LHC (9/23) arranged to assess mitral stenosis, which showed primarily pulmonary venous hypertension, normal RA pressure and minimally elevated PCWP, and moderate mitral stenosis.   Follow up 11/23, stable NYHA II symptoms and  euvolemic.  Presented to the ED 08/03/22 with chest pain. Atypical chest pain. HS Trop. WBC was elevated. Given antibiotics.   Admitted 5/24 with a/ c HF, 2/2 running out of meds x several weeks. Echo showed EF 25-30%, RV mildly reduced, mild MR and mild MS. Diuresed with IV lasix and GDMT titrated. Discharged home, weight 138 lbs.  Today he returns for post hospital HF follow up. Overall feeling fine. He is not SOB walking on flat ground. He has been out of his medications x months, now has LE swelling. Denies palpitations, abnormal bleeding, CP, dizziness, or PND/Orthopnea. Appetite ok. No fever or chills. He lost his scale. Smokes 5 cigs/day, drinks 1-1.5 40 oz/beers a day, uses cocaine weekly, smokes THC daily. He is homeless. No longer followed by paramedicine.  ECG (personally reviewed): NSR + LVH, QTc 513 msec  ReDs: 36%  Labs (6/23): K 3.8, creatinine 1.75 Labs (7/23): K 4.3, creatinine 1.39 Labs (8/23): digoxin 0.2, K 4.9, creatinine 1.37 Labs (9/23): K 4.3, creatinine 1.26 Labs (11/23): K 4.1, creatinine 1.65 Labs (5/24): K 4.5, creatinine 2.02  PMH: 1. CVA: Left MCA CVA in 2/23, treated with tenecteplase.  Thought to be due to low EF/cardiomyopathy.  2. Left vestibular schwannoma.  3. Hyperlipidemia 4. H/o cocaine abuse. 5. H/o ETOH abuse.  6. 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  Most recent echo in 9/23 showed moderate mitral stenosis with trivial MR.  - R/LHC (9/23) showed moderate mitral stenosis, mitral valve mean gradient 10.9 mmHg, MVA 1.74 cm^2 7. Chronic systolic CHF: Mixed ischemic/nonischemic CMP.  Cocaine/ETOH abuse, HTN, CAD.  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 - Inferolateral MI 5/23 with LHC showing occlusion of the apical LAD.  - R/LHC (9/23): RA mean 4, PA 50/20 (mean 33), PCWP 15, CO/CI (Fick) 5.74/3.06, PVR 3.1 WU - Echo (5/24): EF 25-30%, RV mildly reduced, mild MR and mild MS. 8. CAD: Inferolateral STEMI in 5/23 with LHC showing occluded apical LAD managed medically (?embolic vs plaque rupture).   Past Medical History:  Diagnosis Date   Alcohol abuse    Asthma    Brain tumor (HCC)    CAD (coronary artery disease)    Chronic HFrEF (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)    Chronic kidney disease, stage 3 (HCC)    Cocaine abuse (HCC)    History of medication noncompliance    Homelessness    MI (myocardial infarction) (HCC)    STEMI (ST elevation myocardial infarction) (HCC)    Stroke (HCC)    Current Outpatient Medications  Medication Sig Dispense Refill   apixaban (ELIQUIS) 5 MG TABS tablet Take 1 tablet (5 mg total) by mouth 2 (two) times daily. (Patient not taking: Reported on 01/23/2023) 60 tablet 0   atorvastatin (LIPITOR) 40 MG tablet Take 1 tablet (40 mg total) by mouth daily. (Patient not taking: Reported on 01/23/2023) 30 tablet 0   carvedilol (COREG) 3.125 MG tablet Take 1 tablet (3.125 mg total) by mouth 2 (two) times daily. (Patient not taking: Reported on 01/23/2023) 60 tablet 0   clopidogrel (PLAVIX) 75 MG tablet Take 1 tablet (75 mg total) by mouth daily. (Patient not taking: Reported on 01/23/2023) 30 tablet 0   empagliflozin (JARDIANCE) 10 MG TABS tablet Take 1 tablet (10 mg total) by mouth daily. (Patient not taking: Reported on 01/23/2023) 30 tablet 0   furosemide (LASIX) 40 MG tablet Take 1 tablet (40 mg total) by mouth daily. (Patient not taking: Reported on 01/23/2023) 30 tablet 0   isosorbide-hydrALAZINE (BIDIL) 20-37.5 MG tablet Take 0.5 tablets by mouth 3 (three) times daily. Take 1 & 1/2 tablets by mouth 3 times daily. (Patient not taking: Reported on 01/23/2023) 60 tablet 0   No  current facility-administered medications for this encounter.   Allergies  Allergen Reactions   Shellfish Allergy Anaphylaxis   Peanut-Containing Drug Products Itching   Social History   Socioeconomic History   Marital status: Significant Other    Spouse name: Not on file   Number of children: 7   Years of education: Not on file   Highest education level: 9th grade  Occupational History   Occupation: unemployed    Comment: experiencing homelessness-living at daily rent motels.  Tobacco Use   Smoking status: Every Day    Packs/day: 1.00    Years: 41.00    Additional pack years: 0.00    Total pack years: 41.00    Types: Cigarettes    Passive exposure: Current   Smokeless tobacco: Never  Vaping Use   Vaping Use: Never used  Substance and Sexual Activity   Alcohol use: Yes    Alcohol/week: 63.0 standard drinks of alcohol    Types: 63 Cans of beer per week    Comment: 3-40oz beer/day x10 years. 1-2 40oz beer/day in 20s/30s   Drug use: Yes  Types: "Crack" cocaine, Cocaine    Comment: last use 01/13/22   Sexual activity: Not on file  Other Topics Concern   Not on file  Social History Narrative   Not on file   Social Determinants of Health   Financial Resource Strain: High Risk (09/07/2022)   Overall Financial Resource Strain (CARDIA)    Difficulty of Paying Living Expenses: Very hard  Food Insecurity: Food Insecurity Present (11/28/2022)   Hunger Vital Sign    Worried About Running Out of Food in the Last Year: Sometimes true    Ran Out of Food in the Last Year: Sometimes true  Transportation Needs: Unmet Transportation Needs (01/17/2023)   PRAPARE - Transportation    Lack of Transportation (Medical): Yes    Lack of Transportation (Non-Medical): Yes  Physical Activity: Not on file  Stress: Stress Concern Present (10/16/2022)   [employer] of Occupational Health - Occupational Stress Questionnaire    Feeling of Stress : To some extent  Social Connections: Not on  file  Intimate Partner Violence: Not At Risk (11/28/2022)   Humiliation, Afraid, Rape, and Kick questionnaire    Fear of Current or Ex-Partner: No    Emotionally Abused: No    Physically Abused: No    Sexually Abused: No   Family History  Problem Relation Age of Onset   Diabetes Mother    BP (!) 144/104   Pulse 98   Wt 73.5 kg (162 lb)   SpO2 100%   BMI 25.37 kg/m   Wt Readings from Last 3 Encounters:  01/23/23 73.5 kg (162 lb)  12/05/22 62.7 kg (138 lb 3.7 oz)  09/12/22 68.8 kg (151 lb 11.2 oz)   PHYSICAL EXAM: General:  NAD. No resp difficulty, walked into clinic, chronically-ill appearing HEENT: Normal Neck: Supple. JVP to ear + v waves. Carotids 2+ bilat; no bruits. No lymphadenopathy or thryomegaly appreciated. Cor: PMI nondisplaced. Regular rate & rhythm. No rubs, gallops or murmurs. Lungs: Clear, diminished in bases. Abdomen: Soft, nontender, +distended. No hepatosplenomegaly. No bruits or masses. Good bowel sounds. Extremities: No cyanosis, clubbing, rash, 2-3+ BLE edema to thighs Neuro: Alert & oriented x 3, cranial nerves grossly intact. Moves all 4 extremities, RUE weak/contracted. Affect pleasant.  ASSESSMENT & PLAN: 1. Acute on Chronic systolic CHF: Patient was found to have cardiomyopathy in 2/23 at time of admission for CVA.  Echo showed EF 25-30% at that time.  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  MI in 5/23 (?cardioembolic) also contributes.  Echo in 5/23 showed LV EF < 20%, RV mildly decreased function, abnormal mitral valve looks rheumatic => suspect moderate mitral stenosis at least with planimetered MVA 1.6 cm^2 and calculated MVA by VTI 0.8 cm^2; mean gradient only 6 mmHg but likely function of low cardiac output. The IVC was small/non-dilated. Echo 9/23 showed EF 30-35% with mildly decreased RV systolic function. RHC (9/23) showed normal RA pressure, mildly elevated PCWP.  Echo (5/24) EF  25-30%, RV mildly reduced. Today, he is markedly volume overloaded but reports NYHA II symptoms. ReDs 36%,weight up 24 lbs from discharge. He has been off all meds x months.  - Discussed admission vs outpatient management. Patient seen with Dr Gasper Lloyd, patient would like to avoid admission. - Unable to arrange IV Lasix in infusion clinic and he is unable to use Furoscix - Start Lasix 80 mg bid x 3 days + 40 KCL bid x 3 days, then Lasix 80 mg daily/40 KCL  daily. BMET/BNP today. - Take metolazone 2.5 mg + extra 40 KCL daily x 3 days. - Restart BiDil 0.5 tab tid - If he remains off cocaine and EF stays low, would be ICD candidate (narrow QRS so no CRT).  2. CAD: Admit 6/23 with inferolateral STEMI by ECG, cath showed occluded apical LAD.  Cannot rule out plaque rupture in setting of cocaine abuse, but given history of suspected cardioembolic CVA, wonder if this was not a cardioembolic MI.  No chest pain.  - Restart Eliquis 5 mg bid. - Restart atorvastatin next visit - Continue Plavix (post-MI, continue 1 year as long as no concerning bleeding) + Eliquis.  Think he should remain anticoagulated (was supposed to be on prior to admission but not sure if he was taking) given suspected cardioembolism.  3. HTN: Uncontrolled today. - Diurese as above - Add back BiDil 4. CVA: 2/23, has residual right-sided weakness.  Thought to be cardioembolic at the time, started on anticoagulation.   - As above, restart Eliquis given concerns with compliance with warfarin INR checks. No abnormal bleeding. 5. Cocaine abuse: Ongoing use. Discussed cessation.  6. 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   Echo 9/23 shows moderate MS with trivial MR. Unusual given age. He denies known rheumatic fever though he remembers have strep throat at least twice as a child. L/RHC (9/23) showed moderate mitral stenosis, with mitral valve mean gradient 10.9 mmHg, MVA 1.74 cm^2. - Echo (5/24) showed  parachute mitral valve with mild to moderate mitral stenosis. He is not overly symptomatic. 7. Homeless: Has a dog. HFSW helping with resources.  - Discharged from paramedicine. 8. Vestibular schwannoma: Has had vertigo and falls. No falls recently. Suspect this could be contributing to recent symptoms.    Follow up in 1 week with APP, he is high risk for re-admission. I personally filled his pillbox today Discussed plan with Dr. Gasper Lloyd.   [facility] [name]-BC  01/23/2023

## 2023-01-23 NOTE — Transportation (Signed)
H&V Care Navigation CSW Progress Note  Clinical Social Worker consulted to help with ride to clinic next Tuesday to follow up as he has been off meds and needs check in since restarting today.  CSW arranged [employer] taxi to bring to appt.  Patient is participating in a Managed Medicaid Plan:  Yes  SDOH Screenings   Food Insecurity: Food Insecurity Present (11/28/2022)  Housing: Medium Risk (11/28/2022)  Transportation Needs: Unmet Transportation Needs (01/17/2023)  Utilities: At Risk (11/28/2022)  Alcohol Screen: High Risk (09/19/2021)  Depression (PHQ2-9): High Risk (06/13/2022)  Financial Resource Strain: High Risk (09/07/2022)  Stress: Stress Concern Present (10/16/2022)  Tobacco Use: High Risk (01/23/2023)    01/23/2023  Lynelle Doctor Rini DOB: 1971/01/31 MRN: [id number]   RIDER WAIVER AND RELEASE OF LIABILITY  For the purposes of helping with transportation needs, [facility] partners with outside transportation providers (taxi companies, [place], Catering manager.) to give [employer] patients or other approved people the choice of on-demand rides [employer]") to our buildings for non-emergency visits.  By using [employer], I, the person signing this document, on behalf of myself and/or any legal minors (in my care using the [employer]), agree:  Science writer given to me are supplied by independent, outside transportation providers who do not work for, or have any affiliation with, [employer]. [place] is not a transportation company. [place] has no control over the quality or safety of the rides I get using [employer]. [place] has no control over whether any outside ride will happen on time or not. [place] gives no guarantee on the reliability, quality, safety, or availability on any rides, or that no mistakes will happen. I know and accept that traveling by vehicle (car, truck, SVU, Zenaida Niece, bus, taxi, etc.) has risks of serious injuries such as  disability, being paralyzed, and death. I know and agree the risk of using [employer] is mine alone, and not [employer]. Transport Services are provided "as is" and as are available. The transportation providers are in charge for all inspections and care of the vehicles used to provide these rides. I agree not to take legal action against [place], its agents, employees, officers, directors, representatives, insurers, attorneys, assigns, successors, subsidiaries, and affiliates at any time for any reasons related directly or indirectly to using [employer]. I also agree not to take legal action against [place] or its affiliates for any injury, death, or damage to property caused by or related to using [employer]. I have read this Waiver and Release of Liability, and I understand the terms used in it and their legal meaning. This Waiver is freely and voluntarily given with the understanding that my right (or any legal minors) to legal action against [place] relating to [employer] is knowingly given up to use these services.   I attest that I read the Ride Waiver and Release of Liability to Sharol Roussel, gave Mr. Hefel the opportunity to ask questions and answered the questions asked (if any). I affirm that Quiton Rutland Faller then provided consent for assistance with transportation.     [facility]

## 2023-01-23 NOTE — Patient Instructions (Addendum)
Thank you for coming in today  If you had labs drawn today, any labs that are abnormal the clinic will call you No news is good news  Medications: START Lasix 80 mg twice daily for 3 days then Lasix 80 mg daily  TAKE Metolazone 2.5 mg with Potassim 40 meq for 3 days only START Potassium 40 meq daily RESTART Bidil 0.5 tablet 3 times daily   Follow up appointments:  Your physician recommends that you schedule a follow-up appointment in:  July 9th 2024 at 08:30 a.m.    Do the following things EVERYDAY: Weigh yourself in the morning before breakfast. Write it down and keep it in a log. Take your medicines as prescribed Eat low salt foods--Limit salt (sodium) to 2000 mg per day.  Stay as active as you can everyday Limit all fluids for the day to less than 2 liters   At the [facility], you and your health needs are our priority. As part of our continuing mission to provide you with exceptional heart care, we have created designated Provider Care Teams. These Care Teams include your primary Cardiologist (physician) and Advanced Practice Providers (APPs- Physician Assistants and Nurse Practitioners) who all work together to provide you with the care you need, when you need it.   You may see any of the following providers on your designated Care Team at your next follow up: Dr Arvilla Meres Dr Marca Ancona Dr. Marcos Eke, NP Robbie Lis, [place] [facility], [place] Brynda Peon, NP Karle Plumber, PharmD   Please be sure to bring in all your medications bottles to every appointment.    Thank you for choosing [facility]  If you have any questions or concerns before your next appointment please send [place] a message through [place] or call our office at [phone].    TO LEAVE A MESSAGE FOR THE NURSE SELECT OPTION 2, PLEASE LEAVE A MESSAGE INCLUDING: YOUR NAME DATE OF BIRTH CALL BACK  NUMBER REASON FOR CALL**this is important as we prioritize the call backs  YOU WILL RECEIVE A CALL BACK THE SAME DAY AS LONG AS YOU CALL BEFORE 4:00 PM

## 2023-01-23 NOTE — Progress Notes (Signed)
ReDS Vest / Clip - 01/23/23 1400       ReDS Vest / Clip   Station Marker C    Ruler Value 32    ReDS Value Range Moderate volume overload    ReDS Actual Value 36

## 2023-01-28 ENCOUNTER — Telehealth (HOSPITAL_COMMUNITY): Payer: Self-pay | Admitting: Licensed Clinical Social Worker

## 2023-01-28 ENCOUNTER — Telehealth (HOSPITAL_COMMUNITY): Payer: Self-pay

## 2023-01-28 NOTE — Telephone Encounter (Signed)
Called and left patient a detailed message to confirm/remind patient of their appointment at the [facility] on 01/29/23.   And to bring all medications and/or complete list.

## 2023-01-28 NOTE — Telephone Encounter (Signed)
H&V Care Navigation CSW Progress Note  Clinical Social Worker confirmed with taxi company that ride is scheduled for tomorrow morning.  CSW called pt to remind of appt tomorrow morning and pick up from [employer] parking lot that pt requested at 8am.  Pt confirmed that he is still able to attend and is aware of ride and appt details.  Patient is participating in a Managed Medicaid Plan:  Yes  SDOH Screenings   Food Insecurity: Food Insecurity Present (11/28/2022)  Housing: Medium Risk (11/28/2022)  Transportation Needs: Unmet Transportation Needs (01/23/2023)  Utilities: At Risk (11/28/2022)  Alcohol Screen: High Risk (09/19/2021)  Depression (PHQ2-9): High Risk (06/13/2022)  Financial Resource Strain: High Risk (09/07/2022)  Stress: Stress Concern Present (10/16/2022)  Tobacco Use: High Risk (01/23/2023)    Burna Sis, LCSW Clinical Social Worker [facility] Desk#: [phone] Cell#: [phone]

## 2023-01-29 ENCOUNTER — Encounter (HOSPITAL_COMMUNITY): Payer: Medicaid Other

## 2023-01-30 ENCOUNTER — Encounter: Payer: Self-pay | Admitting: Family

## 2023-01-30 ENCOUNTER — Other Ambulatory Visit (HOSPITAL_COMMUNITY): Payer: Self-pay

## 2023-01-30 ENCOUNTER — Other Ambulatory Visit: Payer: Self-pay | Admitting: Family

## 2023-01-30 ENCOUNTER — Ambulatory Visit (INDEPENDENT_AMBULATORY_CARE_PROVIDER_SITE_OTHER): Payer: Medicaid Other | Admitting: Family

## 2023-01-30 VITALS — BP 126/85 | HR 102 | Ht 65.0 in | Wt 138.6 lb

## 2023-01-30 DIAGNOSIS — Z122 Encounter for screening for malignant neoplasm of respiratory organs: Secondary | ICD-10-CM

## 2023-01-30 DIAGNOSIS — R634 Abnormal weight loss: Secondary | ICD-10-CM

## 2023-01-30 DIAGNOSIS — L309 Dermatitis, unspecified: Secondary | ICD-10-CM

## 2023-01-30 DIAGNOSIS — R0989 Other specified symptoms and signs involving the circulatory and respiratory systems: Secondary | ICD-10-CM | POA: Diagnosis not present

## 2023-01-30 DIAGNOSIS — Z1211 Encounter for screening for malignant neoplasm of colon: Secondary | ICD-10-CM

## 2023-01-30 DIAGNOSIS — R200 Anesthesia of skin: Secondary | ICD-10-CM

## 2023-01-30 DIAGNOSIS — M25641 Stiffness of right hand, not elsewhere classified: Secondary | ICD-10-CM

## 2023-01-30 DIAGNOSIS — M25642 Stiffness of left hand, not elsewhere classified: Secondary | ICD-10-CM

## 2023-01-30 MED ORDER — TRIAMCINOLONE ACETONIDE 0.5 % EX OINT
1.0000 | TOPICAL_OINTMENT | Freq: Two times a day (BID) | CUTANEOUS | 0 refills | Status: DC
Start: 2023-01-30 — End: 2023-03-06
  Filled 2023-01-30: qty 60, 30d supply, fill #0
  Filled 2023-01-31: qty 45, 23d supply, fill #0
  Filled 2023-03-04 – 2023-03-06 (×2): qty 15, 8d supply, fill #1

## 2023-01-30 MED ORDER — TRIAMCINOLONE ACETONIDE 0.5 % EX OINT
1.0000 | TOPICAL_OINTMENT | Freq: Two times a day (BID) | CUTANEOUS | 0 refills | Status: DC
Start: 2023-01-30 — End: 2023-01-30
  Filled 2023-01-30: qty 30, 15d supply, fill #0

## 2023-01-30 NOTE — Progress Notes (Signed)
Patient ID: Craig Schmidt, male    DOB: 01-13-1971  MRN: [id number]  CC: Follow-Up  Subjective: Craig Schmidt is a 52 y.o. male who presents for follow-up.   His concerns today include:  - Runny nose. Denies associated red flag symptoms. Requests Covid test.  - Needs refills on Triamcinolone ointment.  - Losing weight. Denies associated red flag symptoms. He is established with Cardiology for heart failure management.  - Hand stiffness and numbness. Requests referral back to Physical Therapy.   Patient Active Problem List   Diagnosis Date Noted   Acute on chronic combined systolic and diastolic heart failure (HCC) 12/03/2022   Malnutrition of moderate degree 11/30/2022   Acute on chronic combined systolic and diastolic CHF (congestive heart failure) (HCC) 11/29/2022   Acute on chronic combined systolic (congestive) and diastolic (congestive) heart failure (HCC) 11/28/2022   ACS (acute coronary syndrome) (HCC) 11/28/2022   Back pain 11/28/2022   Essential hypertension 11/28/2022   Dyslipidemia 11/28/2022   Noncompliance with medication regimen 11/28/2022   ST elevation myocardial infarction (STEMI) of inferolateral wall, subsequent episode of care (HCC) 12/19/2021   ST elevation myocardial infarction (STEMI) (HCC)    Ischemic cardiomyopathy    Cocaine abuse (HCC)    Smoker    Alcohol abuse    Acute ischemic stroke (HCC) 09/08/2021   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   Tear of MCL (medial collateral ligament) of knee 05/20/2015   Protein-calorie malnutrition, severe 05/17/2015   Lactic acidosis 05/15/2015   Left knee pain 05/15/2015   Lung nodules 05/15/2015   Hypoglycemia 05/14/2015   Hypothermia 05/14/2015   Acute encephalopathy 05/14/2015   Cocaine abuse with intoxication (HCC) 05/14/2015   Alcohol intoxication in active alcoholic (HCC) 05/14/2015   Sepsis (HCC) 05/14/2015   Homeless 05/14/2015     Current Outpatient Medications on File Prior  to Visit  Medication Sig Dispense Refill   apixaban (ELIQUIS) 5 MG TABS tablet Take 1 tablet (5 mg total) by mouth 2 (two) times daily. 60 tablet 6   atorvastatin (LIPITOR) 40 MG tablet Take 1 tablet (40 mg total) by mouth daily. 30 tablet 0   carvedilol (COREG) 3.125 MG tablet Take 1 tablet (3.125 mg total) by mouth 2 (two) times daily. 60 tablet 0   clopidogrel (PLAVIX) 75 MG tablet Take 1 tablet (75 mg total) by mouth daily. 30 tablet 0   empagliflozin (JARDIANCE) 10 MG TABS tablet Take 1 tablet (10 mg total) by mouth daily. 30 tablet 0   furosemide (LASIX) 20 MG tablet Take 4 tablets (80 mg total) by mouth daily. 144 tablet 6   furosemide (LASIX) 40 MG tablet Take 1 tablet (40 mg total) by mouth daily. 30 tablet 0   isosorbide-hydrALAZINE (BIDIL) 20-37.5 MG tablet Take 1/2 tablet by mouth 3 (three) times daily. 45 tablet 6   metolazone (ZAROXOLYN) 2.5 MG tablet Take 1 tablet (2.5 mg total) by mouth as directed. 3 tablet 0   potassium chloride SA (KLOR-CON M) 20 MEQ tablet Take 2 tablets (40 mEq total) by mouth daily. 36 tablet 6   No current facility-administered medications on file prior to visit.    Allergies  Allergen Reactions   Shellfish Allergy Anaphylaxis   Peanut-Containing Drug Products Itching    Social History   Socioeconomic History   Marital status: Significant Other    Spouse name: Not on file   Number of children: 7   Years of education: Not on file   Highest education level: 9th  grade  Occupational History   Occupation: unemployed    Comment: experiencing homelessness-living at daily rent motels.  Tobacco Use   Smoking status: Every Day    Packs/day: 1.00    Years: 41.00    Additional pack years: 0.00    Total pack years: 41.00    Types: Cigarettes    Passive exposure: Current   Smokeless tobacco: Never  Vaping Use   Vaping Use: Never used  Substance and Sexual Activity   Alcohol use: Yes    Alcohol/week: 63.0 standard drinks of alcohol    Types: 63  Cans of beer per week    Comment: 3-40oz beer/day x10 years. 1-2 40oz beer/day in 20s/30s   Drug use: Yes    Types: "Crack" cocaine, Cocaine    Comment: last use 01/13/22   Sexual activity: Not on file  Other Topics Concern   Not on file  Social History Narrative   Not on file   Social Determinants of Health   Financial Resource Strain: High Risk (09/07/2022)   Overall Financial Resource Strain (CARDIA)    Difficulty of Paying Living Expenses: Very hard  Food Insecurity: Food Insecurity Present (11/28/2022)   Hunger Vital Sign    Worried About Running Out of Food in the Last Year: Sometimes true    Ran Out of Food in the Last Year: Sometimes true  Transportation Needs: Unmet Transportation Needs (01/23/2023)   PRAPARE - Administrator, Civil Service (Medical): Yes    Lack of Transportation (Non-Medical): Yes  Physical Activity: Not on file  Stress: Stress Concern Present (10/16/2022)   [employer] of Occupational Health - Occupational Stress Questionnaire    Feeling of Stress : To some extent  Social Connections: Not on file  Intimate Partner Violence: Not At Risk (11/28/2022)   Humiliation, Afraid, Rape, and Kick questionnaire    Fear of Current or Ex-Partner: No    Emotionally Abused: No    Physically Abused: No    Sexually Abused: No    Family History  Problem Relation Age of Onset   Diabetes Mother     Past Surgical History:  Procedure Laterality Date   BUBBLE STUDY  09/11/2021   Procedure: BUBBLE STUDY;  Surgeon: Pricilla Riffle, MD;  Location: [facility] ENDOSCOPY;  Service: Cardiovascular;;   LEFT HEART CATH AND CORONARY ANGIOGRAPHY N/A 12/19/2021   Procedure: LEFT HEART CATH AND CORONARY ANGIOGRAPHY;  Surgeon: Lyn Records, MD;  Location: [facility] INVASIVE CV LAB;  Service: Cardiovascular;  Laterality: N/A;   RIGHT/LEFT HEART CATH AND CORONARY ANGIOGRAPHY N/A 04/13/2022   Procedure: RIGHT/LEFT HEART CATH AND CORONARY ANGIOGRAPHY;  Surgeon: Laurey Morale, MD;   Location: [facility] INVASIVE CV LAB;  Service: Cardiovascular;  Laterality: N/A;   TEE WITHOUT CARDIOVERSION N/A 09/11/2021   Procedure: TRANSESOPHAGEAL ECHOCARDIOGRAM (TEE);  Surgeon: Pricilla Riffle, MD;  Location: [facility] ENDOSCOPY;  Service: Cardiovascular;  Laterality: N/A;    ROS: Review of Systems Negative except as stated above  PHYSICAL EXAM: BP 126/85   Pulse (!) 102   Ht 5\' 5"  (1.651 m)   Wt 138 lb 9.6 oz (62.9 kg)   SpO2 97%   BMI 23.06 kg/m   Physical Exam HENT:     Head: Normocephalic and atraumatic.     Nose: Nose normal.     Mouth/Throat:     Mouth: Mucous membranes are moist.     Pharynx: Oropharynx is clear.  Eyes:     Extraocular Movements: Extraocular movements intact.  Conjunctiva/sclera: Conjunctivae normal.     Pupils: Pupils are equal, round, and reactive to light.  Cardiovascular:     Rate and Rhythm: Tachycardia present.     Pulses: Normal pulses.     Heart sounds: Normal heart sounds.  Pulmonary:     Effort: Pulmonary effort is normal.     Breath sounds: Normal breath sounds.  Musculoskeletal:        General: Normal range of motion.     Cervical back: Normal range of motion and neck supple.  Neurological:     General: No focal deficit present.     Mental Status: He is alert and oriented to person, place, and time.  Psychiatric:        Mood and Affect: Mood normal.        Behavior: Behavior normal.     ASSESSMENT AND PLAN: 1. Runny nose - Routine screening.  - COVID-19, Flu A+B and RSV  2. Eczema, unspecified type - Continue Triamcinolone ointment as prescribed. Counseled on medication adherence.  - Follow-up with primary provider as scheduled.  - triamcinolone ointment (KENALOG) 0.5 %; Apply 1 Application topically 2 (two) times daily.  Dispense: 60 g; Refill: 0  3. Loss of weight - Patient recently had routine labs collected.  - Routine screening.  - Sedimentation Rate - POCT URINALYSIS DIP (CLINITEK)  4. Encounter for screening for lung  cancer - Routine screening.  - CT CHEST LUNG CA SCREEN LOW DOSE W/O CM; Future  5. Colon cancer screening - Routine screening.  - Ambulatory referral to Gastroenterology  6. Hand joint stiff, left 7. Hand joint stiff, right 8. Hand numbness - Referral to Physical Therapy for further evaluation/management.  - Ambulatory referral to Physical Therapy    Patient was given the opportunity to ask questions.  Patient verbalized understanding of the plan and was able to repeat key elements of the plan. Patient was given clear instructions to go to Emergency Department or return to medical center if symptoms don't improve, worsen, or new problems develop.The patient verbalized understanding.   Orders Placed This Encounter  Procedures   COVID-19, Flu A+B and RSV   CT CHEST LUNG CA SCREEN LOW DOSE W/O CM   Sedimentation Rate   Ambulatory referral to Gastroenterology   Ambulatory referral to Physical Therapy   POCT URINALYSIS DIP (CLINITEK)     Requested Prescriptions   Signed Prescriptions Disp Refills   triamcinolone ointment (KENALOG) 0.5 % 60 g 0    Sig: Apply 1 Application topically 2 (two) times daily.    Follow-up with primary provider as scheduled.   Rema Fendt, NP

## 2023-01-30 NOTE — Progress Notes (Signed)
Pt wants to get a covid test.   Pt wants to get on the skin cream that you previously had him on.   Pt needs paper work filled out for [employer] he has been losing weight and wondering if he should be worried about his weight loss

## 2023-01-31 ENCOUNTER — Other Ambulatory Visit (HOSPITAL_COMMUNITY): Payer: Self-pay

## 2023-01-31 LAB — SEDIMENTATION RATE: Sed Rate: 50 mm/hr — ABNORMAL HIGH (ref 0–30)

## 2023-02-01 ENCOUNTER — Other Ambulatory Visit: Payer: Self-pay | Admitting: Family

## 2023-02-01 DIAGNOSIS — R634 Abnormal weight loss: Secondary | ICD-10-CM

## 2023-02-04 ENCOUNTER — Telehealth (HOSPITAL_COMMUNITY): Payer: Self-pay

## 2023-02-04 NOTE — Telephone Encounter (Signed)
Called to confirm/remind patient of their appointment at the [facility] on 02/05/23.   Patient reminded to bring all medications and/or complete list.  Confirmed patient has transportation. Gave directions, instructed to utilize valet parking.  Confirmed appointment prior to ending call.

## 2023-02-05 ENCOUNTER — Encounter (HOSPITAL_COMMUNITY): Payer: Self-pay

## 2023-02-05 ENCOUNTER — Other Ambulatory Visit (HOSPITAL_COMMUNITY): Payer: Self-pay

## 2023-02-05 ENCOUNTER — Ambulatory Visit (HOSPITAL_COMMUNITY)
Admission: RE | Admit: 2023-02-05 | Discharge: 2023-02-05 | Disposition: A | Discharge status: Home / Self Care | Payer: Medicaid Other | Source: Ambulatory Visit | Attending: Family Medicine | Admitting: Family Medicine

## 2023-02-05 VITALS — BP 162/110 | HR 106 | Wt 141.6 lb

## 2023-02-05 DIAGNOSIS — Z7901 Long term (current) use of anticoagulants: Secondary | ICD-10-CM | POA: Diagnosis not present

## 2023-02-05 DIAGNOSIS — Z91148 Patient's other noncompliance with medication regimen for other reason: Secondary | ICD-10-CM | POA: Insufficient documentation

## 2023-02-05 DIAGNOSIS — Z5982 Transportation insecurity: Secondary | ICD-10-CM | POA: Insufficient documentation

## 2023-02-05 DIAGNOSIS — F1910 Other psychoactive substance abuse, uncomplicated: Secondary | ICD-10-CM

## 2023-02-05 DIAGNOSIS — E8770 Fluid overload, unspecified: Secondary | ICD-10-CM | POA: Diagnosis not present

## 2023-02-05 DIAGNOSIS — Z8673 Personal history of transient ischemic attack (TIA), and cerebral infarction without residual deficits: Secondary | ICD-10-CM

## 2023-02-05 DIAGNOSIS — F17210 Nicotine dependence, cigarettes, uncomplicated: Secondary | ICD-10-CM | POA: Insufficient documentation

## 2023-02-05 DIAGNOSIS — I130 Hypertensive heart and chronic kidney disease with heart failure and stage 1 through stage 4 chronic kidney disease, or unspecified chronic kidney disease: Secondary | ICD-10-CM | POA: Insufficient documentation

## 2023-02-05 DIAGNOSIS — Q232 Congenital mitral stenosis: Secondary | ICD-10-CM | POA: Diagnosis not present

## 2023-02-05 DIAGNOSIS — D333 Benign neoplasm of cranial nerves: Secondary | ICD-10-CM

## 2023-02-05 DIAGNOSIS — Z5900 Homelessness unspecified: Secondary | ICD-10-CM | POA: Diagnosis not present

## 2023-02-05 DIAGNOSIS — R42 Dizziness and giddiness: Secondary | ICD-10-CM | POA: Diagnosis not present

## 2023-02-05 DIAGNOSIS — Z5986 Financial insecurity: Secondary | ICD-10-CM | POA: Diagnosis not present

## 2023-02-05 DIAGNOSIS — N183 Chronic kidney disease, stage 3 unspecified: Secondary | ICD-10-CM | POA: Insufficient documentation

## 2023-02-05 DIAGNOSIS — I5042 Chronic combined systolic (congestive) and diastolic (congestive) heart failure: Secondary | ICD-10-CM | POA: Diagnosis not present

## 2023-02-05 DIAGNOSIS — Z7984 Long term (current) use of oral hypoglycemic drugs: Secondary | ICD-10-CM | POA: Insufficient documentation

## 2023-02-05 DIAGNOSIS — Z7902 Long term (current) use of antithrombotics/antiplatelets: Secondary | ICD-10-CM | POA: Diagnosis not present

## 2023-02-05 DIAGNOSIS — I050 Rheumatic mitral stenosis: Secondary | ICD-10-CM | POA: Diagnosis not present

## 2023-02-05 DIAGNOSIS — I5022 Chronic systolic (congestive) heart failure: Secondary | ICD-10-CM | POA: Insufficient documentation

## 2023-02-05 DIAGNOSIS — Z79899 Other long term (current) drug therapy: Secondary | ICD-10-CM | POA: Diagnosis not present

## 2023-02-05 DIAGNOSIS — I2510 Atherosclerotic heart disease of native coronary artery without angina pectoris: Secondary | ICD-10-CM | POA: Insufficient documentation

## 2023-02-05 DIAGNOSIS — I69351 Hemiplegia and hemiparesis following cerebral infarction affecting right dominant side: Secondary | ICD-10-CM | POA: Insufficient documentation

## 2023-02-05 DIAGNOSIS — Z833 Family history of diabetes mellitus: Secondary | ICD-10-CM | POA: Diagnosis not present

## 2023-02-05 DIAGNOSIS — F1410 Cocaine abuse, uncomplicated: Secondary | ICD-10-CM | POA: Insufficient documentation

## 2023-02-05 DIAGNOSIS — I10 Essential (primary) hypertension: Secondary | ICD-10-CM

## 2023-02-05 MED ORDER — CARVEDILOL 3.125 MG PO TABS
3.1250 mg | ORAL_TABLET | Freq: Two times a day (BID) | ORAL | 6 refills | Status: DC
  Filled 2023-02-05: qty 60, 30d supply, fill #0
  Filled 2023-03-18 – 2023-04-25 (×2): qty 60, 30d supply, fill #1

## 2023-02-05 MED ORDER — OMRON 3 SERIES BP MONITOR DEVI
0 refills | Status: DC
  Filled 2023-02-05: qty 1, 90d supply, fill #0

## 2023-02-05 MED ORDER — POTASSIUM CHLORIDE CRYS ER 20 MEQ PO TBCR
40.0000 meq | EXTENDED_RELEASE_TABLET | Freq: Every day | ORAL | 6 refills | Status: DC
  Filled 2023-02-05 – 2023-04-25 (×3): qty 60, 30d supply, fill #0

## 2023-02-05 MED ORDER — ATORVASTATIN CALCIUM 40 MG PO TABS
40.0000 mg | ORAL_TABLET | Freq: Every day | ORAL | 6 refills | Status: DC
  Filled 2023-02-05: qty 30, 30d supply, fill #0
  Filled 2023-03-18 – 2023-04-25 (×2): qty 30, 30d supply, fill #1

## 2023-02-05 MED ORDER — CLOPIDOGREL BISULFATE 75 MG PO TABS
75.0000 mg | ORAL_TABLET | Freq: Every day | ORAL | 6 refills | Status: DC
  Filled 2023-02-05: qty 30, 30d supply, fill #0
  Filled 2023-03-18 – 2023-04-25 (×2): qty 30, 30d supply, fill #1

## 2023-02-05 MED ORDER — APIXABAN 5 MG PO TABS
5.0000 mg | ORAL_TABLET | Freq: Two times a day (BID) | ORAL | 6 refills | Status: DC
  Filled 2023-02-05 – 2023-04-25 (×3): qty 60, 30d supply, fill #0

## 2023-02-05 MED ORDER — FUROSEMIDE 40 MG PO TABS
80.0000 mg | ORAL_TABLET | Freq: Every day | ORAL | 6 refills | Status: DC
  Filled 2023-02-05: qty 60, 30d supply, fill #0
  Filled 2023-03-18 – 2023-04-25 (×2): qty 60, 30d supply, fill #1

## 2023-02-05 NOTE — Progress Notes (Signed)
ReDS Vest / Clip - 02/05/23 1100       ReDS Vest / Clip   Station Marker A    Ruler Value 32    ReDS Value Range Low volume    ReDS Actual Value 35

## 2023-02-05 NOTE — Patient Instructions (Addendum)
Thank you for coming in today  If you had labs drawn today, any labs that are abnormal the clinic will call you No news is good news   You were given a prescription for a blood pressure cuff please take to a local pharmacy  You were also given a scale.  Medications: RESTART Lasix 80 mg 2 tablets daily RESTART Atorvastatin 40 mg 1 tablet daily RESTART Plavix 75 mg 1 tablet daily RESTART Coreg 3.125 mg 1 tablet twice daily  RESTART/CONTINUE Eliquis 5 mg 1 tablet twice daily   Follow up appointments:  Your physician recommends that you schedule a follow-up appointment in:  3-4 weeks in clinic   Do the following things EVERYDAY: Weigh yourself in the morning before breakfast. Write it down and keep it in a log. Take your medicines as prescribed Eat low salt foods--Limit salt (sodium) to 2000 mg per day.  Stay as active as you can everyday Limit all fluids for the day to less than 2 liters   At the [facility], you and your health needs are our priority. As part of our continuing mission to provide you with exceptional heart care, we have created designated Provider Care Teams. These Care Teams include your primary Cardiologist (physician) and Advanced Practice Providers (APPs- Physician Assistants and Nurse Practitioners) who all work together to provide you with the care you need, when you need it.   You may see any of the following providers on your designated Care Team at your next follow up: Dr Arvilla Meres Dr Marca Ancona Dr. Marcos Eke, NP Robbie Lis, [place] [facility] [place], [place] Brynda Peon, NP Karle Plumber, PharmD   Please be sure to bring in all your medications bottles to every appointment.    Thank you for choosing [facility]  If you have any questions or concerns before your next appointment please send [place] a message through [place] or call our office at [phone].     TO LEAVE A MESSAGE FOR THE NURSE SELECT OPTION 2, PLEASE LEAVE A MESSAGE INCLUDING: YOUR NAME DATE OF BIRTH CALL BACK NUMBER REASON FOR CALL**this is important as we prioritize the call backs  YOU WILL RECEIVE A CALL BACK THE SAME DAY AS LONG AS YOU CALL BEFORE 4:00 PM

## 2023-02-05 NOTE — Progress Notes (Signed)
-  [facility] Note   Primary Care: Rema Fendt, NP HF Cardiologist: Dr. Shirlee Latch  HPI: Craig Schmidt is a 52 y.o. 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   Admitted 02/23 with left MCA CVA s/p tenecteplase. Stroke felt to be likely secondary to cardiomyopathy. Echo EF 25-30%,k RV mildly reduced, RVSP 50 mmHg, moderate MS, mild to moderate MR, moderate to severe TR, dilated IVC. TEE 02/23: EF 25-30%, RV severely reduced, MV stenotic (? Parachute mitral valve), MVA 1.45 cm2 consistent with severe MS, moderate MR, no interatrial shunt, no LV or LAA thrombus. Cardiology consulted. CM likely d/t subtance abuse +/- uncontrolled HTN. Had been started on coumadin but later switched to eliquis d/t concern for compliance given his social situation.  Admitted 5/23 with acute inferolateral STEMI. Had used cocaine 24 hrs prior to presentation. Emergent LHC with 100% apical LAD suspected embolic vs plaque rupture with thrombosis. LVEDP 40. Echo this admit showed EF < 20%, ? RV okay. He was significantly volume overloaded and hypertensive. Started on lasix gtt and nitro gtt. Had good diuresis but Scr began to trend up, 1.5>1.77>1.87>2.0.  Developed soft BP and BP-active meds held. There was concern for low-output. PICC placed and Co-ox within normal range. Drips weaned and GDMT started. He was discharged home, weight 156 lbs.  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   R/LHC (9/23) arranged to assess mitral stenosis, which showed primarily pulmonary venous hypertension, normal RA pressure and minimally elevated PCWP, and moderate mitral stenosis.   Follow up 11/23, stable NYHA II symptoms and  euvolemic.  Presented to the ED 08/03/22 with chest pain. Atypical chest pain. HS Trop. WBC was elevated. Given antibiotics.   Admitted 5/24 with a/ c HF, 2/2 running out of meds x several weeks. Echo showed EF 25-30%, RV mildly reduced, mild MR and mild MS. Diuresed with IV lasix and GDMT titrated. Discharged home, weight 138 lbs.  Post hospital follow up 7/24, volume overloaded 2/2 med non compliance with weigh gain of 24 lbs since discharge. Discussed admission vs outpatient management, he requested outpatient management. Lasix increased to 80 mg bid, metolazone 2.5 mg added daily x 3 days. Planned for close follow up.  Today he returns for HF follow up. Overall feeling fine. He is not SOB walking on flat ground or with ADLs. Denies palpitations, abnormal bleeding, CP, dizziness, edema, or PND/Orthopnea. Appetite ok. No fever or chills. He lost his scale, smokes and drinks ETOH daily, no cocaine in a couple weeks. He took his meds x 1 week when we filled his pill box, he does not know what he has been taking since, his GF has his pillbox.  ECG (personally reviewed): ST 104 bpm, LVH QTc 512 msec  ReDs: 32%  Labs (6/23): K 3.8, creatinine 1.75 Labs (7/23): K 4.3, creatinine 1.39 Labs (8/23): digoxin 0.2, K 4.9, creatinine 1.37 Labs (9/23): K 4.3, creatinine 1.26 Labs (11/23): K 4.1, creatinine 1.65 Labs (5/24): K 4.5, creatinine 2.02 Labs (7/24): K 4.2, creatinine 1.7  PMH: 1. CVA: Left MCA CVA in 2/23, treated with tenecteplase.  Thought to be due to low EF/cardiomyopathy.  2. Left vestibular schwannoma.  3. Hyperlipidemia 4. H/o cocaine abuse. 5. H/o ETOH abuse.  6.  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  Most recent echo in 9/23 showed moderate mitral stenosis with trivial MR.  - R/LHC (9/23) showed moderate mitral stenosis, mitral valve mean gradient 10.9 mmHg, MVA 1.74 cm^2 7. Chronic systolic CHF: Mixed ischemic/nonischemic CMP.  Cocaine/ETOH abuse, HTN, CAD.  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 - Inferolateral MI 5/23 with LHC showing occlusion of the apical LAD.  - R/LHC (9/23): RA mean 4, PA 50/20 (mean 33), PCWP 15, CO/CI (Fick) 5.74/3.06, PVR 3.1 WU - Echo (5/24): EF 25-30%, RV mildly reduced, mild MR and mild MS. 8. CAD: Inferolateral STEMI in 5/23 with LHC showing occluded apical LAD managed medically (?embolic vs plaque rupture).   Past Medical History:  Diagnosis Date   Alcohol abuse    Asthma    Brain tumor (HCC)    CAD (coronary artery disease)    Chronic HFrEF (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)    Chronic kidney disease, stage 3 (HCC)    Cocaine abuse (HCC)    History of medication noncompliance    Homelessness    MI (myocardial infarction) (HCC)    STEMI (ST elevation myocardial infarction) (HCC)    Stroke (HCC)    Current Outpatient Medications  Medication Sig Dispense Refill   furosemide (LASIX) 20 MG tablet Take 4 tablets (80 mg total) by mouth daily. 144 tablet 6   isosorbide-hydrALAZINE (BIDIL) 20-37.5 MG tablet Take 1/2 tablet by mouth 3 (three) times daily. 45 tablet 6   potassium chloride SA (KLOR-CON M) 20 MEQ tablet Take 2 tablets (40 mEq total) by mouth daily. 36 tablet 6   apixaban (ELIQUIS) 5 MG TABS tablet Take 1 tablet (5 mg total) by mouth 2 (two) times daily. 60 tablet 6   atorvastatin (LIPITOR) 40 MG tablet Take 1 tablet (40 mg total) by mouth daily. 30 tablet 0   carvedilol (COREG) 3.125 MG tablet Take 1 tablet (3.125 mg total) by mouth 2 (two) times daily. 60 tablet 0   clopidogrel (PLAVIX) 75 MG tablet Take 1 tablet (75 mg total) by mouth daily. 30 tablet 0   empagliflozin  (JARDIANCE) 10 MG TABS tablet Take 1 tablet (10 mg total) by mouth daily. 30 tablet 0   metolazone (ZAROXOLYN) 2.5 MG tablet Take 1 tablet (2.5 mg total) by mouth as directed. 3 tablet 0   triamcinolone ointment (KENALOG) 0.5 % Apply 1 Application topically 2 (two) times daily. 60 g 0   No current facility-administered medications for this encounter.   Allergies  Allergen Reactions   Shellfish Allergy Anaphylaxis   Peanut-Containing Drug Products Itching   Social History   Socioeconomic History   Marital status: Significant Other    Spouse name: Not on file   Number of children: 7   Years of education: Not on file   Highest education level: 9th grade  Occupational History   Occupation: unemployed    Comment: experiencing homelessness-living at daily rent motels.  Tobacco Use   Smoking status: Every Day    Current packs/day: 1.00    Average packs/day: 1 pack/day for 41.0 years (41.0 ttl pk-yrs)    Types: Cigarettes    Passive  exposure: Current   Smokeless tobacco: Never  Vaping Use   Vaping status: Never Used  Substance and Sexual Activity   Alcohol use: Yes    Alcohol/week: 63.0 standard drinks of alcohol    Types: 63 Cans of beer per week    Comment: 3-40oz beer/day x10 years. 1-2 40oz beer/day in 20s/30s   Drug use: Yes    Types: "Crack" cocaine, Cocaine    Comment: last use 01/13/22   Sexual activity: Not on file  Other Topics Concern   Not on file  Social History Narrative   Not on file   Social Determinants of Health   Financial Resource Strain: High Risk (09/07/2022)   Overall Financial Resource Strain (CARDIA)    Difficulty of Paying Living Expenses: Very hard  Food Insecurity: Food Insecurity Present (11/28/2022)   Hunger Vital Sign    Worried About Running Out of Food in the Last Year: Sometimes true    Ran Out of Food in the Last Year: Sometimes true  Transportation Needs: Unmet Transportation Needs (01/23/2023)   PRAPARE - Scientist, research (physical sciences) (Medical): Yes    Lack of Transportation (Non-Medical): Yes  Physical Activity: Not on file  Stress: Stress Concern Present (10/16/2022)   [employer] of Occupational Health - Occupational Stress Questionnaire    Feeling of Stress : To some extent  Social Connections: Not on file  Intimate Partner Violence: Not At Risk (11/28/2022)   Humiliation, Afraid, Rape, and Kick questionnaire    Fear of Current or Ex-Partner: No    Emotionally Abused: No    Physically Abused: No    Sexually Abused: No   Family History  Problem Relation Age of Onset   Diabetes Mother    BP (!) 162/110   Pulse (!) 106   Wt 64.2 kg (141 lb 9.6 oz)   SpO2 99%   BMI 23.56 kg/m   Wt Readings from Last 3 Encounters:  02/05/23 64.2 kg (141 lb 9.6 oz)  01/30/23 62.9 kg (138 lb 9.6 oz)  01/23/23 73.5 kg (162 lb)   PHYSICAL EXAM: General:  NAD. No resp difficulty, walked into clinic, disheveled  HEENT: Normal Neck: Supple. No JVD. Carotids 2+ bilat; no bruits. No lymphadenopathy or thryomegaly appreciated. Cor: PMI nondisplaced. Regular rate & rhythm. No rubs, gallops or murmurs. Lungs: Clear Abdomen: Soft, nontender, nondistended. No hepatosplenomegaly. No bruits or masses. Good bowel sounds. Extremities: No cyanosis, clubbing, rash,1+ BLE  edema R>L Neuro: Alert & oriented x 3, cranial nerves grossly intact. Moves all 4 extremities w/o difficulty. Affect pleasant.  ASSESSMENT & PLAN: 1. Chronic systolic CHF: Patient was found to have cardiomyopathy in 2/23 at time of admission for CVA.  Echo showed EF 25-30% at that time.  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  MI in 5/23 (?cardioembolic) also contributes.  Echo in 5/23 showed LV EF < 20%, RV mildly decreased function, abnormal mitral valve looks rheumatic => suspect moderate mitral stenosis at least with planimetered MVA 1.6 cm^2 and calculated MVA by VTI 0.8 cm^2; mean  gradient only 6 mmHg but likely function of low cardiac output. The IVC was small/non-dilated. Echo 9/23 showed EF 30-35% with mildly decreased RV systolic function. RHC (9/23) showed normal RA pressure, mildly elevated PCWP.  Echo (5/24) EF 25-30%, RV mildly reduced. NYHA II and volume much improved, weight down 21 lbs since last visit. ReDs 32%.  - Continue Lasix 80 mg daily +  40 KCL daily. BMET and BNP today - Continue BiDil 0.5 tab tid. - Restart Jardiance 10  mg daily. - Restart Coreg 3.125 mg bid. - If he remains off cocaine and EF stays low, would be ICD candidate (narrow QRS so no CRT).  2. CAD: Admit 6/23 with inferolateral STEMI by ECG, cath showed occluded apical LAD.  Cannot rule out plaque rupture in setting of cocaine abuse, but given history of suspected cardioembolic CVA, wonder if this was not a cardioembolic MI.  No chest pain.  - Continue Eliquis 5 mg bid. - Restart atorvastatin 40 mg daily. - Continue Plavix (post-MI, continue 1 year as long as no concerning bleeding) + Eliquis. Think he should remain anticoagulated (was supposed to be on prior to admission but not sure if he was taking) given suspected cardioembolism.  3. HTN: BP elevated today. - Restart meds as above. 4. CVA: 2/23, has residual right-sided weakness.  Thought to be cardioembolic at the time, started on anticoagulation.   - As above, restart Eliquis given concerns with compliance with warfarin INR checks. No abnormal bleeding. 5. Cocaine abuse: Ongoing use. Discussed cessation.  6. 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   Echo 9/23 shows moderate MS with trivial MR. Unusual given age. He denies known rheumatic fever though he remembers have strep throat at least twice as a child. L/RHC (9/23) showed moderate mitral stenosis, with mitral valve mean gradient 10.9 mmHg, MVA 1.74 cm^2. - Echo (5/24) showed parachute mitral valve with mild to moderate mitral stenosis. He is not  overly symptomatic. 7. Homeless: Has a dog. HFSW helping with resources.  - Discharged from paramedicine. 8. Vestibular schwannoma: Has had vertigo and falls. No falls recently. Suspect this could be contributing to recent symptoms.    Follow up in 3-4 weeks with APP, he is high-risk for re-admit.   Anderson Malta [facility] [name]-BC  02/05/2023

## 2023-02-06 ENCOUNTER — Telehealth: Payer: Self-pay | Admitting: *Deleted

## 2023-02-06 ENCOUNTER — Other Ambulatory Visit (HOSPITAL_COMMUNITY): Payer: Self-pay

## 2023-02-06 NOTE — Telephone Encounter (Signed)
Patient returned call to practice. Not sure who called him or why to return call. In review of chart, noted CMA A. Gaye Pollack had letter to send out 02/06/23. No recent results to review with patient . Please advise

## 2023-02-07 NOTE — Telephone Encounter (Signed)
I called him and let him know about the weight management, and the Paperwork he left.

## 2023-02-19 ENCOUNTER — Encounter: Payer: Self-pay | Admitting: Family

## 2023-02-19 NOTE — Telephone Encounter (Signed)
Patient request papers that were left at office

## 2023-02-19 NOTE — Telephone Encounter (Signed)
Giving them to Amy tomorrow 02/20/2023.

## 2023-02-21 ENCOUNTER — Inpatient Hospital Stay: Admission: RE | Admit: 2023-02-21 | Payer: Medicaid Other | Source: Ambulatory Visit

## 2023-02-27 ENCOUNTER — Telehealth: Payer: Self-pay | Admitting: Family

## 2023-03-01 ENCOUNTER — Telehealth: Payer: Self-pay | Admitting: Family

## 2023-03-01 ENCOUNTER — Other Ambulatory Visit: Payer: Self-pay | Admitting: Family

## 2023-03-01 DIAGNOSIS — I639 Cerebral infarction, unspecified: Secondary | ICD-10-CM

## 2023-03-01 DIAGNOSIS — I255 Ischemic cardiomyopathy: Secondary | ICD-10-CM

## 2023-03-01 DIAGNOSIS — I5043 Acute on chronic combined systolic (congestive) and diastolic (congestive) heart failure: Secondary | ICD-10-CM

## 2023-03-01 NOTE — Telephone Encounter (Signed)
Pt is calling in regarding his paper work and to see if it was faxed back over to his case worker Tommy. Pt does not know Tommy fax number. Pt states that Tommy's fax machine was down but it is back up. Pt states that he is coming to the office after 12pm to pick up the paperwork.

## 2023-03-04 ENCOUNTER — Encounter: Payer: Medicaid Other | Admitting: Family

## 2023-03-04 ENCOUNTER — Telehealth: Payer: Self-pay | Admitting: Family

## 2023-03-04 ENCOUNTER — Telehealth: Payer: Self-pay

## 2023-03-04 ENCOUNTER — Other Ambulatory Visit: Payer: Self-pay

## 2023-03-04 ENCOUNTER — Telehealth (HOSPITAL_COMMUNITY): Payer: Self-pay | Admitting: Licensed Clinical Social Worker

## 2023-03-04 ENCOUNTER — Encounter: Payer: Self-pay | Admitting: Family

## 2023-03-04 ENCOUNTER — Other Ambulatory Visit: Payer: Self-pay | Admitting: Family

## 2023-03-04 DIAGNOSIS — H04129 Dry eye syndrome of unspecified lacrimal gland: Secondary | ICD-10-CM

## 2023-03-04 DIAGNOSIS — I10 Essential (primary) hypertension: Secondary | ICD-10-CM

## 2023-03-04 DIAGNOSIS — R600 Localized edema: Secondary | ICD-10-CM

## 2023-03-04 DIAGNOSIS — R0989 Other specified symptoms and signs involving the circulatory and respiratory systems: Secondary | ICD-10-CM

## 2023-03-04 DIAGNOSIS — I2510 Atherosclerotic heart disease of native coronary artery without angina pectoris: Secondary | ICD-10-CM

## 2023-03-04 DIAGNOSIS — I5022 Chronic systolic (congestive) heart failure: Secondary | ICD-10-CM

## 2023-03-04 DIAGNOSIS — Z0100 Encounter for examination of eyes and vision without abnormal findings: Secondary | ICD-10-CM

## 2023-03-04 NOTE — Telephone Encounter (Signed)
Noted  

## 2023-03-04 NOTE — Telephone Encounter (Signed)
CSW called pt to remind of appt Thursday- he states he still plans on attending- CSW will call Thursday morning to finalize transportation details with patient  Burna Sis, LCSW Clinical Social Worker [facility] Desk#: [phone] Cell#: [phone]

## 2023-03-04 NOTE — Telephone Encounter (Signed)
I faxed his papers to the place they needed to go. His papers are up front for him to pick up.

## 2023-03-04 NOTE — Telephone Encounter (Signed)
Pt states he has no preference for home health. You can call him at [phone].  He said once they call him to come out for assessment he can call his case worker to get a motel. I explained to him they will not know how many times they will see him a week until they do his initial assessment.  Pt verbalized understanding.

## 2023-03-04 NOTE — Telephone Encounter (Signed)
I called patient back and had to leave a message for him to return my call.  I want to clarify with him that this home health referral is different than personal care services

## 2023-03-04 NOTE — Telephone Encounter (Signed)
Will be faxed today.

## 2023-03-04 NOTE — Telephone Encounter (Signed)
Pt is calling in because he says the pharmacy told him he needs to have his PCP send in a prescription for compression socks, and pt would like an order to be put in for him.

## 2023-03-04 NOTE — Telephone Encounter (Signed)
Order received for home health SN,PT,OT,SW and HHA. I called the patient to inquire if he has a preference for home health agencies and he did not.  He then said that he needs to speak to his caseworker first.  He was not able tell me where the caseworker is from but he needed to check before starting home health.  I explained to him that this is not for personal care services, that referral was already sent.   He still said he will check with the caseworker and call me back.

## 2023-03-04 NOTE — Progress Notes (Addendum)
Pt needs compressions socks. Pt has been having leg swelling, and he was almost hospitalized. Pt swelling started happening after his visit with [place] in July.   Amy I forgot to give him a Covid test las time and he is requesting a covid test again.   Pt is asking for eye drops.

## 2023-03-04 NOTE — Telephone Encounter (Signed)
Copied from CRM [id number]. Topic: General - Other >> Mar 01, 2023  2:27 PM Turkey B wrote: Reason for CRM: caller  named Natalia Leatherwood from [facility] says they needed clarificaion on if pt needs PT or PCS services. She says doesnt have a cb number but she will fax this request. The number on caller ID wont get to her so she will fax in

## 2023-03-04 NOTE — Progress Notes (Addendum)
Patient ID: Craig Schmidt, male    DOB: [birth year]  MRN: [id number]  CC: Follow-Up  Subjective: Craig Schmidt is a 52 y.o. male who presents for follow-up.   His concerns today include:  - Requests referral to eye doctor. Reports bilateral eye itching and red eyes. He tried over-the-counter Visine.  - Reports last office visit he never received Covid test. He denies symptoms/red flag symptoms today.  - Established with Cardiology for chronic conditions management. Requests order for compression stockings for bilateral lower extremities edema, no red flag symptoms. He is aware to keep all scheduled appointments with Cardiology.  - Requests referral to nutritionist for advisement on what best to eat for chronic conditions.  - No further issues/concerns for discussion today.   Patient Active Problem List   Diagnosis Date Noted   Acute on chronic combined systolic and diastolic heart failure (HCC) 12/03/2022   Malnutrition of moderate degree 11/30/2022   Acute on chronic combined systolic and diastolic CHF (congestive heart failure) (HCC) 11/29/2022   Acute on chronic combined systolic (congestive) and diastolic (congestive) heart failure (HCC) 11/28/2022   ACS (acute coronary syndrome) (HCC) 11/28/2022   Back pain 11/28/2022   Essential hypertension 11/28/2022   Dyslipidemia 11/28/2022   Noncompliance with medication regimen 11/28/2022   ST elevation myocardial infarction (STEMI) of inferolateral wall, subsequent episode of care (HCC) 12/19/2021   ST elevation myocardial infarction (STEMI) (HCC)    Ischemic cardiomyopathy    Cocaine abuse (HCC)    Smoker    Alcohol abuse    Acute ischemic stroke (HCC) 09/08/2021   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   Tear of MCL (medial collateral ligament) of knee 05/20/2015   Protein-calorie malnutrition, severe 05/17/2015   Lactic acidosis 05/15/2015   Left knee pain 05/15/2015   Lung nodules 05/15/2015   Hypoglycemia  05/14/2015   Hypothermia 05/14/2015   Acute encephalopathy 05/14/2015   Cocaine abuse with intoxication (HCC) 05/14/2015   Alcohol intoxication in active alcoholic (HCC) 05/14/2015   Sepsis (HCC) 05/14/2015   Homeless 05/14/2015     Current Outpatient Medications on File Prior to Visit  Medication Sig Dispense Refill   apixaban (ELIQUIS) 5 MG TABS tablet Take 1 tablet (5 mg total) by mouth 2 (two) times daily. 60 tablet 6   atorvastatin (LIPITOR) 40 MG tablet Take 1 tablet (40 mg total) by mouth daily. 30 tablet 6   Blood Pressure Monitoring (OMRON 3 SERIES BP MONITOR) DEVI Use as directed 1 each 0   carvedilol (COREG) 3.125 MG tablet Take 1 tablet (3.125 mg total) by mouth 2 (two) times daily. 60 tablet 6   clopidogrel (PLAVIX) 75 MG tablet Take 1 tablet (75 mg total) by mouth daily. 30 tablet 6   empagliflozin (JARDIANCE) 10 MG TABS tablet Take 1 tablet (10 mg total) by mouth daily. 30 tablet 0   furosemide (LASIX) 40 MG tablet Take 2 tablets (80 mg total) by mouth daily. 60 tablet 6   isosorbide-hydrALAZINE (BIDIL) 20-37.5 MG tablet Take 1/2 tablet by mouth 3 (three) times daily. 45 tablet 6   metolazone (ZAROXOLYN) 2.5 MG tablet Take 1 tablet (2.5 mg total) by mouth as directed. 3 tablet 0   potassium chloride SA (KLOR-CON M) 20 MEQ tablet Take 2 tablets (40 mEq total) by mouth daily. 60 tablet 6   triamcinolone ointment (KENALOG) 0.5 % Apply 1 Application topically 2 (two) times daily. 60 g 0   No current facility-administered medications on file prior to visit.  Allergies  Allergen Reactions   Shellfish Allergy Anaphylaxis   Peanut-Containing Drug Products Itching    Social History   Socioeconomic History   Marital status: Significant Other    Spouse name: Not on file   Number of children: 7   Years of education: Not on file   Highest education level: 9th grade  Occupational History   Occupation: unemployed    Comment: experiencing homelessness-living at daily rent  motels.  Tobacco Use   Smoking status: Every Day    Current packs/day: 1.00    Average packs/day: 1 pack/day for 41.0 years (41.0 ttl pk-yrs)    Types: Cigarettes    Passive exposure: Current   Smokeless tobacco: Never  Vaping Use   Vaping status: Never Used  Substance and Sexual Activity   Alcohol use: Yes    Alcohol/week: 63.0 standard drinks of alcohol    Types: 63 Cans of beer per week    Comment: 3-40oz beer/day x10 years. 1-2 40oz beer/day in 20s/30s   Drug use: Yes    Types: "Crack" cocaine, Cocaine    Comment: last use 01/13/22   Sexual activity: Not on file  Other Topics Concern   Not on file  Social History Narrative   Not on file   Social Determinants of Health   Financial Resource Strain: High Risk (09/07/2022)   Overall Financial Resource Strain (CARDIA)    Difficulty of Paying Living Expenses: Very hard  Food Insecurity: Food Insecurity Present (11/28/2022)   Hunger Vital Sign    Worried About Running Out of Food in the Last Year: Sometimes true    Ran Out of Food in the Last Year: Sometimes true  Transportation Needs: Unmet Transportation Needs (01/23/2023)   PRAPARE - Administrator, Civil Service (Medical): Yes    Lack of Transportation (Non-Medical): Yes  Physical Activity: Not on file  Stress: Stress Concern Present (10/16/2022)   [employer] of Occupational Health - Occupational Stress Questionnaire    Feeling of Stress : To some extent  Social Connections: Not on file  Intimate Partner Violence: Not At Risk (11/28/2022)   Humiliation, Afraid, Rape, and Kick questionnaire    Fear of Current or Ex-Partner: No    Emotionally Abused: No    Physically Abused: No    Sexually Abused: No    Family History  Problem Relation Age of Onset   Diabetes Mother     Past Surgical History:  Procedure Laterality Date   BUBBLE STUDY  09/11/2021   Procedure: BUBBLE STUDY;  Surgeon: Pricilla Riffle, MD;  Location: [facility] ENDOSCOPY;  Service: Cardiovascular;;    LEFT HEART CATH AND CORONARY ANGIOGRAPHY N/A 12/19/2021   Procedure: LEFT HEART CATH AND CORONARY ANGIOGRAPHY;  Surgeon: Lyn Records, MD;  Location: [facility] INVASIVE CV LAB;  Service: Cardiovascular;  Laterality: N/A;   RIGHT/LEFT HEART CATH AND CORONARY ANGIOGRAPHY N/A 04/13/2022   Procedure: RIGHT/LEFT HEART CATH AND CORONARY ANGIOGRAPHY;  Surgeon: Laurey Morale, MD;  Location: [facility] INVASIVE CV LAB;  Service: Cardiovascular;  Laterality: N/A;   TEE WITHOUT CARDIOVERSION N/A 09/11/2021   Procedure: TRANSESOPHAGEAL ECHOCARDIOGRAM (TEE);  Surgeon: Pricilla Riffle, MD;  Location: [facility] ENDOSCOPY;  Service: Cardiovascular;  Laterality: N/A;    ROS: Review of Systems Negative except as stated above  PHYSICAL EXAM: BP (!) 150/101   Pulse 94   Temp 98.1 F (36.7 C) (Oral)   Ht 5\' 7"  (1.702 m)   Wt 144 lb 3.2 oz (65.4 kg)   SpO2  97%   BMI 22.58 kg/m   Physical Exam HENT:     Head: Normocephalic and atraumatic.     Nose: Nose normal.     Mouth/Throat:     Mouth: Mucous membranes are moist.     Pharynx: Oropharynx is clear.  Eyes:     Extraocular Movements: Extraocular movements intact.     Conjunctiva/sclera: Conjunctivae normal.     Pupils: Pupils are equal, round, and reactive to light.  Cardiovascular:     Rate and Rhythm: Normal rate and regular rhythm.     Pulses: Normal pulses.     Heart sounds: Normal heart sounds.  Pulmonary:     Effort: Pulmonary effort is normal.     Breath sounds: Normal breath sounds.  Musculoskeletal:        General: Normal range of motion.     Cervical back: Normal range of motion and neck supple.     Right hip: Normal.     Left hip: Normal.     Right upper leg: Normal.     Left upper leg: Normal.     Right knee: Normal.     Left knee: Normal.     Right lower leg: Edema present.     Left lower leg: Edema present.     Right ankle: Swelling present.     Left ankle: Swelling present.     Right foot: Swelling present.     Left foot: Swelling  present.  Neurological:     General: No focal deficit present.     Mental Status: He is alert and oriented to person, place, and time.  Psychiatric:        Mood and Affect: Mood normal.        Behavior: Behavior normal.     ASSESSMENT AND PLAN: Routine eye exam Dry eye  - Referral to Ophthalmology for further evaluation/management.   3. Runny nose  - Patient today in no cardiopulmonary/acute distress.  - Routine screening.   4. Primary hypertension  5. Chronic systolic heart failure 6. CAD in native artery  7. Edema lower extremity  - Referral to Nutrition for evaluation/management.  - Compression stockings ordered.  - Keep all scheduled appointments with Cardiology.    Patient was given the opportunity to ask questions.  Patient verbalized understanding of the plan and was able to repeat key elements of the plan. Patient was given clear instructions to go to Emergency Department or return to medical center if symptoms don't improve, worsen, or new problems develop.The patient verbalized understanding.  Follow-up with primary provider as scheduled.   Rema Fendt, NP

## 2023-03-05 NOTE — Telephone Encounter (Signed)
I spoke to the patient and explained the difference between home health services and personal care [facility]) and he said he understood.  I told him that I will check with multiple home health agencies for the RN, PT, OT, MSW but there is no guarantee that they will be able to accept the referral.  [facility]- not in network with Medicaid plans  I called the following agencies and they are not able to accept either due to staffing or being out of network: [facility]   Message sent to [facility]/ [facility] health inquiring if they would be able to accept the referral  Message sent to [facility] inquiring if they can accept the referral.  I spoke to [facility] and she requested the referral be faxed for review.   It was then faxed as requested to: [phone].

## 2023-03-05 NOTE — Telephone Encounter (Signed)
Message received from [facility] stating they are not able to accept the referral.

## 2023-03-05 NOTE — Telephone Encounter (Signed)
Message received from [facility] stating they are not in network with the patient's insurance and can't accept

## 2023-03-06 ENCOUNTER — Telehealth (HOSPITAL_COMMUNITY): Payer: Self-pay

## 2023-03-06 ENCOUNTER — Other Ambulatory Visit (HOSPITAL_COMMUNITY): Payer: Self-pay

## 2023-03-06 ENCOUNTER — Telehealth: Payer: Self-pay

## 2023-03-06 ENCOUNTER — Telehealth: Payer: Self-pay | Admitting: Family

## 2023-03-06 ENCOUNTER — Other Ambulatory Visit: Payer: Self-pay

## 2023-03-06 DIAGNOSIS — L309 Dermatitis, unspecified: Secondary | ICD-10-CM

## 2023-03-06 MED ORDER — TRIAMCINOLONE ACETONIDE 0.5 % EX OINT
1.0000 | TOPICAL_OINTMENT | Freq: Two times a day (BID) | CUTANEOUS | 0 refills | Status: DC
Start: 2023-03-06 — End: 2023-06-10
  Filled 2023-03-06: qty 30, 15d supply, fill #0
  Filled 2023-03-07: qty 60, 10d supply, fill #0
  Filled 2023-03-18: qty 60, 15d supply, fill #0
  Filled 2023-04-25: qty 60, 30d supply, fill #0

## 2023-03-06 NOTE — Telephone Encounter (Signed)
Copied from CRM [id number]. Topic: General - Other >> Mar 06, 2023  4:13 PM Dominique A wrote: Reason for CRM: Pt states that he went to the pharmacy and was told that his compression socks and cream was not called into them. Please advise and call pt back. >> Mar 06, 2023  4:16 PM Baldemar Lenis S wrote: Called patient left voicemail for pt to call back

## 2023-03-06 NOTE — Telephone Encounter (Signed)
Pt given lab results per notes of Amy, NP on 03/06/23. Pt verbalized understanding. Patient has been scheduled for follow-up appointment Not detected.  Written by Rema Fendt, NP on 03/06/2023  7:53 AM EDT

## 2023-03-06 NOTE — Telephone Encounter (Signed)
Called patient left voicemail for him to call office back, if he calls [facility] they can let him know that paperwork for compression socks was faxed to pharmacy transition of care on 03/05/2023 and nurse will send refill on creme.

## 2023-03-06 NOTE — Telephone Encounter (Signed)
Called to confirm/remind patient of their appointment at the [facility] on 03/07/23.   Patient reminded to bring all medications and/or complete list.  Confirmed patient has transportation. Gave directions, instructed to utilize valet parking.  Confirmed appointment prior to ending call.

## 2023-03-07 ENCOUNTER — Other Ambulatory Visit (HOSPITAL_COMMUNITY): Payer: Self-pay

## 2023-03-07 ENCOUNTER — Encounter (HOSPITAL_COMMUNITY): Payer: Self-pay

## 2023-03-07 ENCOUNTER — Ambulatory Visit (HOSPITAL_COMMUNITY)
Admission: RE | Admit: 2023-03-07 | Discharge: 2023-03-07 | Disposition: A | Discharge status: Home / Self Care | Payer: Medicaid Other | Source: Ambulatory Visit | Attending: Cardiology | Admitting: Cardiology

## 2023-03-07 ENCOUNTER — Telehealth (HOSPITAL_COMMUNITY): Payer: Self-pay | Admitting: Licensed Clinical Social Worker

## 2023-03-07 VITALS — BP 122/84 | HR 97 | Wt 152.0 lb

## 2023-03-07 DIAGNOSIS — F1410 Cocaine abuse, uncomplicated: Secondary | ICD-10-CM | POA: Insufficient documentation

## 2023-03-07 DIAGNOSIS — Z8589 Personal history of malignant neoplasm of other organs and systems: Secondary | ICD-10-CM | POA: Insufficient documentation

## 2023-03-07 DIAGNOSIS — Z7984 Long term (current) use of oral hypoglycemic drugs: Secondary | ICD-10-CM | POA: Diagnosis not present

## 2023-03-07 DIAGNOSIS — I429 Cardiomyopathy, unspecified: Secondary | ICD-10-CM | POA: Diagnosis not present

## 2023-03-07 DIAGNOSIS — I130 Hypertensive heart and chronic kidney disease with heart failure and stage 1 through stage 4 chronic kidney disease, or unspecified chronic kidney disease: Secondary | ICD-10-CM | POA: Insufficient documentation

## 2023-03-07 DIAGNOSIS — Z91148 Patient's other noncompliance with medication regimen for other reason: Secondary | ICD-10-CM | POA: Diagnosis not present

## 2023-03-07 DIAGNOSIS — Z7901 Long term (current) use of anticoagulants: Secondary | ICD-10-CM | POA: Insufficient documentation

## 2023-03-07 DIAGNOSIS — R531 Weakness: Secondary | ICD-10-CM | POA: Diagnosis not present

## 2023-03-07 DIAGNOSIS — Z7902 Long term (current) use of antithrombotics/antiplatelets: Secondary | ICD-10-CM | POA: Diagnosis not present

## 2023-03-07 DIAGNOSIS — R42 Dizziness and giddiness: Secondary | ICD-10-CM | POA: Diagnosis not present

## 2023-03-07 DIAGNOSIS — Z79899 Other long term (current) drug therapy: Secondary | ICD-10-CM | POA: Insufficient documentation

## 2023-03-07 DIAGNOSIS — I252 Old myocardial infarction: Secondary | ICD-10-CM | POA: Insufficient documentation

## 2023-03-07 DIAGNOSIS — N183 Chronic kidney disease, stage 3 unspecified: Secondary | ICD-10-CM | POA: Insufficient documentation

## 2023-03-07 DIAGNOSIS — I050 Rheumatic mitral stenosis: Secondary | ICD-10-CM | POA: Insufficient documentation

## 2023-03-07 DIAGNOSIS — Z5900 Homelessness unspecified: Secondary | ICD-10-CM | POA: Insufficient documentation

## 2023-03-07 DIAGNOSIS — I5043 Acute on chronic combined systolic (congestive) and diastolic (congestive) heart failure: Secondary | ICD-10-CM | POA: Insufficient documentation

## 2023-03-07 DIAGNOSIS — Z8673 Personal history of transient ischemic attack (TIA), and cerebral infarction without residual deficits: Secondary | ICD-10-CM | POA: Insufficient documentation

## 2023-03-07 LAB — CBC
HCT: 40.8 % (ref 39.0–52.0)
Hemoglobin: 13.1 g/dL (ref 13.0–17.0)
MCH: 29.8 pg (ref 26.0–34.0)
MCHC: 32.1 g/dL (ref 30.0–36.0)
MCV: 92.9 fL (ref 80.0–100.0)
Platelets: 231 10*3/uL (ref 150–400)
RBC: 4.39 MIL/uL (ref 4.22–5.81)
RDW: 19.8 % — ABNORMAL HIGH (ref 11.5–15.5)
WBC: 6.9 10*3/uL (ref 4.0–10.5)
nRBC: 0 % (ref 0.0–0.2)

## 2023-03-07 LAB — COMPREHENSIVE METABOLIC PANEL
ALT: 12 U/L (ref 0–44)
AST: 40 U/L (ref 15–41)
Albumin: 3.5 g/dL (ref 3.5–5.0)
Alkaline Phosphatase: 83 U/L (ref 38–126)
Anion gap: 11 (ref 5–15)
BUN: 20 mg/dL (ref 6–20)
CO2: 22 mmol/L (ref 22–32)
Calcium: 9.5 mg/dL (ref 8.9–10.3)
Chloride: 105 mmol/L (ref 98–111)
Creatinine, Ser: 1.8 mg/dL — ABNORMAL HIGH (ref 0.61–1.24)
GFR, Estimated: 45 mL/min — ABNORMAL LOW (ref >60)
Glucose, Bld: 91 mg/dL (ref 70–99)
Potassium: 4.6 mmol/L (ref 3.5–5.1)
Sodium: 138 mmol/L (ref 135–145)
Total Bilirubin: 1.2 mg/dL (ref 0.3–1.2)
Total Protein: 6.8 g/dL (ref 6.5–8.1)

## 2023-03-07 LAB — BRAIN NATRIURETIC PEPTIDE: B Natriuretic Peptide: 2990.6 pg/mL — ABNORMAL HIGH (ref 0.0–100.0)

## 2023-03-07 NOTE — Telephone Encounter (Signed)
H&V Care Navigation CSW Progress Note  Clinical Social Worker  received call form patient  to assist with transportation.  Patient is participating in a Managed Medicaid Plan:  Yes Patient called requesting transportation to appointment today. [employer] arranged for round trip. Waiver reviewed Lasandra Beech, [place], CCSW-MCS [phone]   SDOH Screenings   Food Insecurity: Food Insecurity Present (11/28/2022)  Housing: Medium Risk (11/28/2022)  Transportation Needs: Unmet Transportation Needs (03/07/2023)  Utilities: At Risk (11/28/2022)  Alcohol Screen: High Risk (09/19/2021)  Depression (PHQ2-9): High Risk (06/13/2022)  Financial Resource Strain: High Risk (09/07/2022)  Stress: Stress Concern Present (10/16/2022)  Tobacco Use: High Risk (03/04/2023)   03/07/2023  Lynelle Doctor Coutant DOB: [birth year] MRN: [id number]   RIDER WAIVER AND RELEASE OF LIABILITY  For the purposes of helping with transportation needs, [facility] partners with outside transportation providers (taxi companies, [place], Catering manager.) to give [employer] patients or other approved people the choice of on-demand rides [employer]") to our buildings for non-emergency visits.  By using [employer], I, the person signing this document, on behalf of myself and/or any legal minors (in my care using the [employer]), agree:  Science writer given to me are supplied by independent, outside transportation providers who do not work for, or have any affiliation with, [employer]. [place] is not a transportation company. [facility] has no control over the quality or safety of the rides I get using [employer]. [facility] has no control over whether any outside ride will happen on time or not. [facility] gives no guarantee on the reliability, quality, safety, or availability on any rides, or that no mistakes will happen. I know and accept that traveling by vehicle (car, truck, SVU, Zenaida Niece, bus, taxi, etc.) has  risks of serious injuries such as disability, being paralyzed, and death. I know and agree the risk of using [employer] is mine alone, and not [employer]. Transport Services are provided "as is" and as are available. The transportation providers are in charge for all inspections and care of the vehicles used to provide these rides. I agree not to take legal action against [place], its agents, employees, officers, directors, representatives, insurers, attorneys, assigns, successors, subsidiaries, and affiliates at any time for any reasons related directly or indirectly to using [employer]. I also agree not to take legal action against [place] or its affiliates for any injury, death, or damage to property caused by or related to using [employer]. I have read this Waiver and Release of Liability, and I understand the terms used in it and their legal meaning. This Waiver is freely and voluntarily given with the understanding that my right (or any legal minors) to legal action against  relating to [employer] is knowingly given up to use these services.   I attest that I read the Ride Waiver and Release of Liability to Sharol Roussel, gave Mr. Leonti the opportunity to ask questions and answered the questions asked (if any). I affirm that Donnivan Kekahuna Economou then provided consent for assistance with transportation.     Marcy Siren                             \

## 2023-03-07 NOTE — Progress Notes (Signed)
ReDS Vest / Clip - 03/07/23 1116       ReDS Vest / Clip   Station Marker A    Ruler Value 31.5    ReDS Value Range High volume overload    ReDS Actual Value 43

## 2023-03-07 NOTE — Patient Instructions (Addendum)
Medication Changes:  TAKE ONE EXTRA DOSE OF LASIX (FUROSEMIDE) TONIGHT 40MG  ONLY   THEN RETURN TO NORMAL DOSING   Lab Work:  Labs done today, your results will be available in MyChart, we will contact you for abnormal readings.  Follow-Up in: 3-4 WEEKS AS SCHEDULED WITH APP   At the [facility], you and your health needs are our priority. We have a designated team specialized in the treatment of Heart Failure. This Care Team includes your primary Heart Failure Specialized Cardiologist (physician), Advanced Practice Providers (APPs- Physician Assistants and Nurse Practitioners), and Pharmacist who all work together to provide you with the care you need, when you need it.   You may see any of the following providers on your designated Care Team at your next follow up:  Dr. Arvilla Meres Dr. Marca Ancona Dr. Marcos Eke, NP Robbie Lis, [place] [facility] [place], [place] Brynda Peon, NP Karle Plumber, PharmD   Please be sure to bring in all your medications bottles to every appointment.   Need to Contact us:  If you have any questions or concerns before your next appointment please send [place] a message through [place] or call our office at [phone].    TO LEAVE A MESSAGE FOR THE NURSE SELECT OPTION 2, PLEASE LEAVE A MESSAGE INCLUDING: YOUR NAME DATE OF BIRTH CALL BACK NUMBER REASON FOR CALL**this is important as we prioritize the call backs  YOU WILL RECEIVE A CALL BACK THE SAME DAY AS LONG AS YOU CALL BEFORE 4:00 PM

## 2023-03-07 NOTE — Progress Notes (Signed)
-  Advanced [facility] Failure Clinic Note   Primary Care: Rema Fendt, NP HF Cardiologist: Dr. Shirlee Latch  HPI: Craig Schmidt is a 52 y.o. 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   Admitted 02/23 with left MCA CVA s/p tenecteplase. Stroke felt to be likely secondary to cardiomyopathy. Echo EF 25-30%,k RV mildly reduced, RVSP 50 mmHg, moderate MS, mild to moderate MR, moderate to severe TR, dilated IVC. TEE 02/23: EF 25-30%, RV severely reduced, MV stenotic (? Parachute mitral valve), MVA 1.45 cm2 consistent with severe MS, moderate MR, no interatrial shunt, no LV or LAA thrombus. Cardiology consulted. CM likely d/t subtance abuse +/- uncontrolled HTN. Had been started on coumadin but later switched to eliquis d/t concern for compliance given his social situation.  Admitted 5/23 with acute inferolateral STEMI. Had used cocaine 24 hrs prior to presentation. Emergent LHC with 100% apical LAD suspected embolic vs plaque rupture with thrombosis. LVEDP 40. Echo this admit showed EF < 20%, ? RV okay. He was significantly volume overloaded and hypertensive. Started on lasix gtt and nitro gtt. Had good diuresis but Scr began to trend up, 1.5>1.77>1.87>2.0.  Developed soft BP and BP-active meds held. There was concern for low-output. PICC placed and Co-ox within normal range. Drips weaned and GDMT started. He was discharged home, weight 156 lbs.  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   R/LHC (9/23) arranged to assess mitral stenosis, which showed primarily pulmonary venous hypertension, normal RA pressure and minimally elevated PCWP, and moderate mitral stenosis.   Follow up 11/23, stable NYHA II symptoms and  euvolemic.  Presented to the ED 08/03/22 with chest pain. Atypical chest pain. HS Trop. WBC was elevated. Given antibiotics.   Admitted 5/24 with a/ c HF, 2/2 running out of meds x several weeks. Echo showed EF 25-30%, RV mildly reduced, mild MR and mild MS. Diuresed with IV lasix and GDMT titrated. Discharged home, weight 138 lbs.  Post hospital follow up 7/24, volume overloaded 2/2 med non compliance with weigh gain of 24 lbs since discharge. Discussed admission vs outpatient management, he requested outpatient management. Lasix increased to 80 mg bid, metolazone 2.5 mg added daily x 3 days. Planned for close follow up.  Today he returns for HF follow up. He has been doing better keeping up w/ appts and has shown his last several scheduled visits. He needs assistance w/ meds as he cites he can't rely on his girlfriend to help him. Says that missed his meds most of last week (including his lasix) but restarted all meds today. He says he is working w/ his PCP to get enrolled in a mobile health assistance program to help manage his meds. His weight is up and has leg edema on exam. ReDs 43%. NYHA Class II. Denies CP.    Labs (6/23): K 3.8, creatinine 1.75 Labs (7/23): K 4.3, creatinine 1.39 Labs (8/23): digoxin 0.2, K 4.9, creatinine 1.37 Labs (9/23): K 4.3, creatinine 1.26 Labs (11/23): K 4.1, creatinine 1.65 Labs (5/24): K 4.5, creatinine 2.02 Labs (7/24): K 4.2, creatinine 1.7  PMH: 1. CVA: Left MCA CVA in 2/23, treated with tenecteplase.  Thought to be due to low EF/cardiomyopathy.  2. Left vestibular schwannoma.  3. Hyperlipidemia 4. H/o cocaine abuse.  5. H/o ETOH abuse.  6. 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  Most recent echo in 9/23 showed moderate mitral stenosis with trivial MR.  - R/LHC (9/23) showed moderate mitral stenosis, mitral valve mean gradient 10.9 mmHg, MVA 1.74 cm^2 7. Chronic systolic CHF: Mixed ischemic/nonischemic CMP.  Cocaine/ETOH abuse, HTN, CAD.  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 - Inferolateral MI 5/23 with LHC showing occlusion of the apical LAD.  - R/LHC (9/23): RA mean 4, PA 50/20 (mean 33), PCWP 15, CO/CI (Fick) 5.74/3.06, PVR 3.1 WU - Echo (5/24): EF 25-30%, RV mildly reduced, mild MR and mild MS. 8. CAD: Inferolateral STEMI in 5/23 with LHC showing occluded apical LAD managed medically (?embolic vs plaque rupture).   Past Medical History:  Diagnosis Date   Alcohol abuse    Asthma    Brain tumor (HCC)    CAD (coronary artery disease)    Chronic HFrEF (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)    Chronic kidney disease, stage 3 (HCC)    Cocaine abuse (HCC)    History of medication noncompliance    Homelessness    MI (myocardial infarction) (HCC)    STEMI (ST elevation myocardial infarction) (HCC)    Stroke (HCC)    Current Outpatient Medications  Medication Sig Dispense Refill   apixaban (ELIQUIS) 5 MG TABS tablet Take 1 tablet (5 mg total) by mouth 2 (two) times daily. 60 tablet 6   atorvastatin (LIPITOR) 40 MG tablet Take 1 tablet (40 mg total) by mouth daily. 30 tablet 6   clopidogrel (PLAVIX) 75 MG tablet Take 1 tablet (75 mg total) by mouth daily. 30 tablet 6   empagliflozin (JARDIANCE) 10 MG TABS tablet Take 1 tablet (10 mg total) by mouth daily. 30 tablet 0   furosemide (LASIX) 40 MG tablet Take 2 tablets (80 mg total) by mouth daily. 60 tablet 6   isosorbide-hydrALAZINE (BIDIL) 20-37.5 MG tablet Take 1/2 tablet by mouth 3 (three) times daily. 45 tablet 6   triamcinolone ointment (KENALOG) 0.5 % Apply 1 Application topically 2 (two) times daily. (Patient taking differently: Apply 1 Application  topically 2 (two) times daily. As needed) 60 g 0   Blood Pressure Monitoring (OMRON 3 SERIES BP MONITOR) DEVI Use as directed (Patient not taking: Reported on 03/07/2023) 1 each 0   carvedilol (COREG) 3.125 MG tablet Take 1 tablet (3.125 mg total) by mouth 2 (two) times daily. (Patient not taking: Reported on 03/07/2023) 60 tablet 6   metolazone (ZAROXOLYN) 2.5 MG tablet Take 1 tablet (2.5 mg total) by mouth as directed. (Patient not taking: Reported on 03/07/2023) 3 tablet 0   potassium chloride SA (KLOR-CON M) 20 MEQ tablet Take 2 tablets (40 mEq total) by mouth daily. (Patient not taking: Reported on 03/07/2023) 60 tablet 6   No current facility-administered medications for this encounter.   Allergies  Allergen Reactions   Shellfish Allergy Anaphylaxis   Peanut-Containing Drug Products Itching   Social History   Socioeconomic History   Marital status: Significant Other    Spouse name: Not on file   Number of children: 7   Years of education: Not on file  Highest education level: 9th grade  Occupational History   Occupation: unemployed    Comment: experiencing homelessness-living at daily rent motels.  Tobacco Use   Smoking status: Every Day    Current packs/day: 1.00    Average packs/day: 1 pack/day for 41.0 years (41.0 ttl pk-yrs)    Types: Cigarettes    Passive exposure: Current   Smokeless tobacco: Never  Vaping Use   Vaping status: Never Used  Substance and Sexual Activity   Alcohol use: Yes    Alcohol/week: 63.0 standard drinks of alcohol    Types: 63 Cans of beer per week    Comment: 3-40oz beer/day x10 years. 1-2 40oz beer/day in 20s/30s   Drug use: Yes    Types: "Crack" cocaine, Cocaine    Comment: last use 01/13/22   Sexual activity: Not on file  Other Topics Concern   Not on file  Social History Narrative   Not on file   Social Determinants of Health   Financial Resource Strain: High Risk (09/07/2022)   Overall Financial Resource Strain (CARDIA)     Difficulty of Paying Living Expenses: Very hard  Food Insecurity: Food Insecurity Present (11/28/2022)   Hunger Vital Sign    Worried About Running Out of Food in the Last Year: Sometimes true    Ran Out of Food in the Last Year: Sometimes true  Transportation Needs: Unmet Transportation Needs (03/07/2023)   PRAPARE - Transportation    Lack of Transportation (Medical): Yes    Lack of Transportation (Non-Medical): Yes  Physical Activity: Not on file  Stress: Stress Concern Present (10/16/2022)   [employer] of Occupational Health - Occupational Stress Questionnaire    Feeling of Stress : To some extent  Social Connections: Not on file  Intimate Partner Violence: Not At Risk (11/28/2022)   Humiliation, Afraid, Rape, and Kick questionnaire    Fear of Current or Ex-Partner: No    Emotionally Abused: No    Physically Abused: No    Sexually Abused: No   Family History  Problem Relation Age of Onset   Diabetes Mother    BP (!) 122/100   Pulse 97   Wt 68.9 kg (152 lb)   SpO2 100%   BMI 23.81 kg/m   Wt Readings from Last 3 Encounters:  03/07/23 68.9 kg (152 lb)  03/04/23 65.4 kg (144 lb 3.2 oz)  02/05/23 64.2 kg (141 lb 9.6 oz)   PHYSICAL EXAM: General:  thin, chronically ill appearing. No respiratory difficulty HEENT: normal Neck: supple. JVD 9 cm. Carotids 2+ bilat; no bruits. No lymphadenopathy or thyromegaly appreciated. Cor: PMI nondisplaced. Regular rate & rhythm. No rubs, gallops or murmurs. Lungs: decreased BS at the bases  Abdomen: soft, nontender, nondistended. No hepatosplenomegaly. No bruits or masses. Good bowel sounds. Extremities: no cyanosis, clubbing, rash, 1+ b/l ankle edema Neuro: alert & oriented x 3, cranial nerves grossly intact. moves all 4 extremities w/o difficulty. Affect pleasant.   ASSESSMENT & PLAN: 1. Chronic systolic CHF: Patient was found to have cardiomyopathy in 2/23 at time of admission for CVA.  Echo showed EF 25-30% at that time.  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  MI in 5/23 (?cardioembolic) also contributes.  Echo in 5/23 showed LV EF < 20%, RV mildly decreased function, abnormal mitral valve looks rheumatic => suspect moderate mitral stenosis at least with planimetered MVA 1.6 cm^2 and calculated MVA by VTI 0.8 cm^2; mean gradient only 6 mmHg but  likely function of low cardiac output. The IVC was small/non-dilated. Echo 9/23 showed EF 30-35% with mildly decreased RV systolic function. RHC (9/23) showed normal RA pressure, mildly elevated PCWP.  Echo (5/24) EF 25-30%, RV mildly reduced. NYHA II. Has mild volume overload on exam in setting of missed doses of Lasix this week. Has started back taking meds today. ReDs 43%  - Instructed to take an extra dose of Lasix this evening then return to 80 mg daily. Discussed importance of strict med compliance. He is being enrolled in a mobile nursing assistance program to help manage meds to improve compliance  - Continue BiDil 0.5 tab tid. - Continue Jardiance 10  mg daily. - Continue Coreg 3.125 mg bid. Will not increase today given volume overload  - If he remains off cocaine and EF stays low, would be ICD candidate (narrow QRS so no CRT).  - check CMP and BNP today  2. CAD: Admit 6/23 with inferolateral STEMI by ECG, cath showed occluded apical LAD.  Cannot rule out plaque rupture in setting of cocaine abuse, but given history of suspected cardioembolic CVA, wonder if this was not a cardioembolic MI.  Denies CP   - Continue Eliquis 5 mg bid. Denies abnormal bleeding. Check CBC  - Continue atorvastatin 40 mg daily (recently restarted). Will need Lipid panel in 6-8 wks  - Continue Plavix (post-MI, continue 1 year as long as no concerning bleeding) + Eliquis. Think he should remain anticoagulated (was supposed to be on prior to admission but not sure if he was taking) given suspected cardioembolism. It has been over 1 year since  his MI. I think we can stop Plavix and switch to ASA but will plan to do next visit. He was instructed to bring all home meds to f/u appt so that he can be shown which medicine to stop. I was not confident in patient's ability to understand instructions and manage this change on his own at this time. Would fear that he would continue Plavix and be on triple therapy w/ ASA and Eliquis, increasing risk for bleeding.  3. HTN: controlled currently  - reiterated importance of remaining compliant w/ meds  4. CVA: 2/23, has residual right-sided weakness.  Thought to be cardioembolic at the time, started on anticoagulation.   - on Eliquis. Denies abnormal bleeding. 5. Cocaine abuse: Ongoing use. Discussed cessation.  6. 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   Echo 9/23 shows moderate MS with trivial MR. Unusual given age. He denies known rheumatic fever though he remembers have strep throat at least twice as a child. L/RHC (9/23) showed moderate mitral stenosis, with mitral valve mean gradient 10.9 mmHg, MVA 1.74 cm^2. - Echo (5/24) showed parachute mitral valve with mild to moderate mitral stenosis. He is not overly symptomatic. 7. Homeless: Has a dog. HFSW helping with resources.  - Discharged from paramedicine. Per pt report, PCP trying to assist w/ new mobile nursing program  8. Vestibular schwannoma: Has had vertigo and falls. No falls recently.    Follow up in 2 weeks with APP to reassess volume status, he is high-risk for re-admit.   Robbie Lis [name]  03/07/2023

## 2023-03-11 ENCOUNTER — Telehealth: Payer: Self-pay | Admitting: Family

## 2023-03-11 NOTE — Telephone Encounter (Signed)
Told his girlfriend to have him call primary care [facility] back.

## 2023-03-11 NOTE — Telephone Encounter (Signed)
Spoke to patient, asked Craig Schmidt if we have any other home health resources available for him to try. Advised him that I left his compression sock referral up front for him to pick up so he can bring them to pharmacies that will accept it.

## 2023-03-11 NOTE — Telephone Encounter (Signed)
I  received a message from [facility] stating that they were accepting the referral and then I received a  message stating they are not able to accept it because he is in a motel and does not have a permanent residence   I called the patient and explained that I have no other options for home health nursing and therapies. Nine agencies have declined the referral due to staffing or not being in network with his insurance.  If he is appropriate for outpatient therapies, a referral can be placed. I also explained to him that a referral for PCS was made and he should be receiving a call from [facility] for a nurse to come and assess his need for personal care/ADL assistance.

## 2023-03-11 NOTE — Telephone Encounter (Signed)
Copied from CRM [id number]. Topic: General - Inquiry >> Mar 08, 2023 12:41 PM Lennox Pippins wrote: Lind[place]day with [name] has called and stated they will not be admitting him to [name] , does not have a permanent residence. Please advise.  Craig Schmidt callback # [phone]

## 2023-03-12 ENCOUNTER — Inpatient Hospital Stay: Admission: RE | Admit: 2023-03-12 | Payer: Medicaid Other | Source: Ambulatory Visit

## 2023-03-12 NOTE — Telephone Encounter (Signed)
Erskine Squibb,  I referred patient to Physical Therapy on 01/30/2023. Please let me know if I can further assist. Thank you.

## 2023-03-13 NOTE — Telephone Encounter (Signed)
Noted  

## 2023-03-15 ENCOUNTER — Other Ambulatory Visit: Payer: Self-pay | Admitting: Family

## 2023-03-15 DIAGNOSIS — M25641 Stiffness of right hand, not elsewhere classified: Secondary | ICD-10-CM

## 2023-03-15 DIAGNOSIS — M25642 Stiffness of left hand, not elsewhere classified: Secondary | ICD-10-CM

## 2023-03-15 DIAGNOSIS — R200 Anesthesia of skin: Secondary | ICD-10-CM

## 2023-03-15 NOTE — Telephone Encounter (Signed)
Complete

## 2023-03-18 ENCOUNTER — Other Ambulatory Visit (HOSPITAL_COMMUNITY): Payer: Self-pay

## 2023-03-19 ENCOUNTER — Other Ambulatory Visit (HOSPITAL_COMMUNITY): Payer: Self-pay

## 2023-03-19 ENCOUNTER — Telehealth: Payer: Self-pay | Admitting: Family

## 2023-03-19 NOTE — Telephone Encounter (Signed)
Annabelle Harman from [employer] [facility] called stated there was no level# on the order for the compression stockings and they are having a hard time getting Medicaid to cover them. Please f/u with patient

## 2023-03-19 NOTE — Telephone Encounter (Signed)
Copied from CRM [id number]. Topic: General - Other >> Mar 19, 2023  1:16 PM [name] wrote: Reason for CRM: Pt called for an update on the Rx order for compression socks. Pt stated the previous Rx order did not have the level listed. Pt requests call back to advise when the Rx will be ready for pick up. Cb# [phone]

## 2023-03-20 ENCOUNTER — Telehealth: Payer: Self-pay | Admitting: Family

## 2023-03-20 NOTE — Telephone Encounter (Signed)
Patient has called back to check the status of this. Patient states the previous prescription order did not have the level listed and the level needs to be listed. Please advise and update patient on the status of this.  Patients callback [phone]

## 2023-03-20 NOTE — Telephone Encounter (Signed)
Medication Refill - Medication:  Viagra  Patient is requesting Viagra or something similar that he can take with his regular daily medications  Has the patient contacted their pharmacy? No  Preferred Pharmacy (with phone number or street name):  [facility] Phone: [phone]  Fax: [phone]       Has the patient been seen for an appointment in the last year OR does the patient have an upcoming appointment? Yes-- follow up on 03/22/2023

## 2023-03-21 ENCOUNTER — Other Ambulatory Visit: Payer: Self-pay | Admitting: Family

## 2023-03-21 DIAGNOSIS — R600 Localized edema: Secondary | ICD-10-CM

## 2023-03-21 NOTE — Telephone Encounter (Signed)
Complete

## 2023-03-21 NOTE — Telephone Encounter (Signed)
Recommendation for patient to consult with Cardiology due to chronic conditions.

## 2023-03-21 NOTE — Telephone Encounter (Signed)
Left voicemail to go over this and inform him of new compression sock prescription.

## 2023-03-22 ENCOUNTER — Telehealth: Payer: Self-pay | Admitting: Family

## 2023-03-22 ENCOUNTER — Ambulatory Visit: Payer: Medicaid Other | Admitting: Family

## 2023-03-22 NOTE — Telephone Encounter (Signed)
Called patient for second time to reach him to talk to him about the prescription.

## 2023-03-22 NOTE — Telephone Encounter (Signed)
Copied from CRM [id number]. Topic: General - Inquiry >> Mar 22, 2023 11:58 AM Lennox Pippins wrote: Patient called and stated he had a missed call, there is a TE from yesterday where Nalan reached out to patient but did not reach him, please advise and contact patient back @ #[phone]

## 2023-03-26 ENCOUNTER — Other Ambulatory Visit: Payer: Self-pay | Admitting: Family

## 2023-03-26 ENCOUNTER — Telehealth: Payer: Self-pay | Admitting: Family

## 2023-03-26 DIAGNOSIS — D333 Benign neoplasm of cranial nerves: Secondary | ICD-10-CM

## 2023-03-26 DIAGNOSIS — I050 Rheumatic mitral stenosis: Secondary | ICD-10-CM

## 2023-03-26 DIAGNOSIS — I5043 Acute on chronic combined systolic (congestive) and diastolic (congestive) heart failure: Secondary | ICD-10-CM

## 2023-03-26 DIAGNOSIS — I2510 Atherosclerotic heart disease of native coronary artery without angina pectoris: Secondary | ICD-10-CM

## 2023-03-26 DIAGNOSIS — I10 Essential (primary) hypertension: Secondary | ICD-10-CM

## 2023-03-26 DIAGNOSIS — Z8673 Personal history of transient ischemic attack (TIA), and cerebral infarction without residual deficits: Secondary | ICD-10-CM

## 2023-03-26 NOTE — Telephone Encounter (Signed)
Left voicemail to call office back so I can relay this to him. As well as speak about leaving his compression sock RX up front for pick up.

## 2023-03-27 NOTE — Telephone Encounter (Signed)
Spoke to patient directly, and informed him on medication, and left his compression sock referral up front for him to pick up. He stated he will pick up his referral sometime this week.

## 2023-03-28 NOTE — Telephone Encounter (Signed)
See note

## 2023-03-28 NOTE — Telephone Encounter (Signed)
Annabelle Harman from the [employer] [facility] called and stated Picked up a RX; there was no level number on the order for the compression socks. Annabelle Harman is requesting a callback with a number so he won't have to come out again.  Please advise.

## 2023-03-28 NOTE — Telephone Encounter (Signed)
See routing comment(s) for message information.

## 2023-03-29 ENCOUNTER — Other Ambulatory Visit: Payer: Self-pay | Admitting: Family

## 2023-03-29 ENCOUNTER — Other Ambulatory Visit (HOSPITAL_COMMUNITY): Payer: Self-pay

## 2023-03-29 ENCOUNTER — Ambulatory Visit: Payer: Medicaid Other | Admitting: Occupational Therapy

## 2023-03-29 DIAGNOSIS — R600 Localized edema: Secondary | ICD-10-CM

## 2023-03-29 NOTE — Telephone Encounter (Signed)
Dr. Andrey Campanile please advise. I prescribed compression stockings for patient on 03/21/2023 with medium level. I am unsure of level number they are requesting. Thank you.

## 2023-03-29 NOTE — Telephone Encounter (Signed)
Thank you Dr. Andrey Campanile. Also, Luke new compression stockings order complete.

## 2023-04-01 ENCOUNTER — Other Ambulatory Visit (HOSPITAL_COMMUNITY): Payer: Self-pay

## 2023-04-02 ENCOUNTER — Telehealth: Payer: Self-pay | Admitting: Family

## 2023-04-02 NOTE — Telephone Encounter (Unsigned)
Copied from CRM [id number]. Topic: General - Other >> Apr 02, 2023 12:50 PM Everette C wrote: Reason for CRM: The patient has called to request a prescription for a seated walker  Please contact the patient further if needed >> Apr 02, 2023  1:00 PM Marlow Baars wrote: The patient wanted the provider to know the address is [address] [place], [place] [address] where he can get the walker. The place is called [facility].

## 2023-04-03 ENCOUNTER — Other Ambulatory Visit: Payer: Self-pay | Admitting: Family

## 2023-04-03 ENCOUNTER — Telehealth (HOSPITAL_COMMUNITY): Payer: Self-pay

## 2023-04-03 ENCOUNTER — Telehealth: Payer: Self-pay | Admitting: Family

## 2023-04-03 DIAGNOSIS — M25642 Stiffness of left hand, not elsewhere classified: Secondary | ICD-10-CM

## 2023-04-03 DIAGNOSIS — R200 Anesthesia of skin: Secondary | ICD-10-CM

## 2023-04-03 DIAGNOSIS — M25641 Stiffness of right hand, not elsewhere classified: Secondary | ICD-10-CM

## 2023-04-03 DIAGNOSIS — Z8673 Personal history of transient ischemic attack (TIA), and cerebral infarction without residual deficits: Secondary | ICD-10-CM

## 2023-04-03 NOTE — Telephone Encounter (Signed)
Called and left patient a detailed message to confirm/remind patient of their appointment at the [facility] on 04/04/23.   And to bring in all medications and/or complete list.

## 2023-04-03 NOTE — Telephone Encounter (Signed)
Copied from CRM [id number]. Topic: General - Other >> Apr 03, 2023  8:56 AM Dondra Prader A wrote: Reason for CRM: Pt is calling to see if a text message can be sent to him for his upcoming appt 04/16/23 @ 9:40 am with PCP. I did provide pt with the appt date and time and did advise that he will still get a reminder phone call but he is still wanting a text message sent to his phone. Please advise.

## 2023-04-03 NOTE — Telephone Encounter (Signed)
Referral to Physical Therapy/Occupation Therapy for evaluation/management. Expect call soon with appointment details.

## 2023-04-03 NOTE — Telephone Encounter (Signed)
Patient will receive text message a day before appointment

## 2023-04-04 ENCOUNTER — Encounter (HOSPITAL_COMMUNITY): Payer: Medicaid Other

## 2023-04-04 ENCOUNTER — Telehealth: Payer: Self-pay | Admitting: Family

## 2023-04-04 NOTE — Telephone Encounter (Signed)
Copied from CRM [id number]. Topic: General - Other >> Apr 02, 2023 12:50 PM Everette C wrote: Reason for CRM: The patient has called to request a prescription for a seated walker  Please contact the patient further if needed >> Apr 04, 2023 10:11 AM Runell Gess P wrote: Pt called asking about a seated walker.  [facility] told him medicaid will pay for one. >> Apr 02, 2023  1:00 PM Marlow Baars wrote: The patient wanted the provider to know the address is [address] [place], [place] [address] where he can get the walker. The place is called [facility].

## 2023-04-08 NOTE — Telephone Encounter (Signed)
Pt is calling to following follow up on request for a seated walker. Please advise  CB [phone]

## 2023-04-10 ENCOUNTER — Ambulatory Visit: Payer: Medicaid Other | Admitting: Physical Therapy

## 2023-04-10 ENCOUNTER — Ambulatory Visit: Payer: Medicaid Other | Admitting: Occupational Therapy

## 2023-04-11 ENCOUNTER — Ambulatory Visit: Payer: Self-pay | Admitting: *Deleted

## 2023-04-11 NOTE — Telephone Encounter (Signed)
  Chief Complaint: nasal congestion "stopped up " nose . "Feels like something" in nose since sickness in August Symptoms: right nostril "stopped up" feels like something in nose but when tries to use tissue to "pick"nose nothing will come out  Frequency: since August  Pertinent Negatives: Patient denies difficulty breathing no bleeding or pain reported  Disposition: [] ED /[] Urgent Care (no appt availability in office) / [x] Appointment(In office/virtual)/ []  [facility]/ [] Home Care/ [] Refused Recommended Disposition /[]  Mobile Bus/ []  Follow-up with PCP Additional Notes: patient has appt already scheduled for 04/16/23. Please advise if earlier appt available.    Summary: runny nose/stopped up   Pt states that he took a covid test on 03/04/2023 at the office and it was negative. Pt states that his nose is still stopped up and runny and does not know if it is his allergies. Please advise.               Reason for Disposition  [1] Suspected FB AND [2] yellow nasal discharge  Answer Assessment - Initial Assessment Questions 1. OBJECT: "What do you think it is?"      Not sure s/p nasal congestion, "stopped up" 2. SIZE: "How large is it?"      Not sure 3. PAIN: "Are you having any pain?" If Yes, ask: "How bad is it?"  (Scale 1-10; or mild, moderate, severe)     na 4. SYMPTOMS: "Is it causing any other symptoms?" (e.g., nasal discharge)     Nasal congestion "feels like something in right" nostril and will not come out  5. ONSET: "How long do you think the foreign body has been in there?"     Since August  Protocols used: Nose - Foreign Body-A-AH

## 2023-04-11 NOTE — Telephone Encounter (Signed)
Pt is calling back to check on his walker. I did ask pt if he has already had his PT/OT evaluation. Pt states that the prescription is needing to be sent to [address]  [place], [place] [address]: [facility]  Pt states that his balance is off and he keep having issues with falling due to him having a stroke and heart issues. Pt states that this is urgent. PT has an upcoming appt with PT/OT next week, he think it is on 04/19/2023.     Please call pt back to discuss: [phone]

## 2023-04-11 NOTE — Telephone Encounter (Signed)
Left message to patient explaining to him that all hi previous messages have been sent to Amy who is out of office until next week. Informed that she will be able to get to these when she returns.

## 2023-04-12 NOTE — Telephone Encounter (Signed)
Called patient to let him know that we do not have any sooner appointments with provider

## 2023-04-15 ENCOUNTER — Telehealth: Payer: Self-pay | Admitting: Family

## 2023-04-15 NOTE — Telephone Encounter (Signed)
Pt called to inquire about walker requested. Advised that nurse will call pt after provider reviews.

## 2023-04-15 NOTE — Telephone Encounter (Unsigned)
Copied from CRM [id number]. Topic: General - Other >> Apr 15, 2023 12:34 PM Craig Schmidt wrote: Reason for CRM: Pt stated that his phone is not working so please have the nurse calling back regarding the walker at [phone]

## 2023-04-16 ENCOUNTER — Ambulatory Visit: Payer: Medicaid Other | Admitting: Family

## 2023-04-16 NOTE — Telephone Encounter (Signed)
Called pt and left vm to call office back to schedule appt

## 2023-04-16 NOTE — Telephone Encounter (Signed)
Patient has several no shows at Rehabilitation. Please schedule office visit for additional details.

## 2023-04-17 NOTE — Telephone Encounter (Signed)
Please refer to my note from 04/16/2023 at 1:39 pm. Thank you.

## 2023-04-25 ENCOUNTER — Other Ambulatory Visit (HOSPITAL_COMMUNITY): Payer: Self-pay

## 2023-05-01 NOTE — Telephone Encounter (Signed)
Messages were left by staff for patient to call office back to schedule appt.

## 2023-05-03 ENCOUNTER — Encounter: Payer: Medicaid Other | Admitting: Family

## 2023-05-03 NOTE — Progress Notes (Signed)
Erroneous encounter-disregard

## 2023-05-06 ENCOUNTER — Ambulatory Visit: Payer: Medicaid Other | Admitting: Family

## 2023-05-09 ENCOUNTER — Telehealth (HOSPITAL_COMMUNITY): Payer: Self-pay

## 2023-05-09 NOTE — Telephone Encounter (Signed)
Called and left a detailed message to confirm/remind patient of their appointment at [facility] on 05/10/23.   And to bring in all medications and/or complete list.

## 2023-05-10 ENCOUNTER — Encounter (HOSPITAL_COMMUNITY): Payer: Medicaid Other

## 2023-05-17 ENCOUNTER — Ambulatory Visit: Payer: Medicaid Other | Admitting: Gastroenterology

## 2023-05-24 ENCOUNTER — Encounter (INDEPENDENT_AMBULATORY_CARE_PROVIDER_SITE_OTHER): Payer: Medicaid Other | Admitting: Family

## 2023-05-24 NOTE — Progress Notes (Signed)
Erroneous encounter-disregard

## 2023-05-25 ENCOUNTER — Other Ambulatory Visit (HOSPITAL_COMMUNITY): Payer: Medicaid Other

## 2023-05-25 ENCOUNTER — Other Ambulatory Visit: Payer: Self-pay

## 2023-05-25 ENCOUNTER — Inpatient Hospital Stay (HOSPITAL_COMMUNITY)
Admission: EM | Admit: 2023-05-25 | Discharge: 2023-05-31 | DRG: 194 | Discharge status: Against Medical Advice | Payer: Medicaid Other | Attending: Family Medicine | Admitting: Family Medicine

## 2023-05-25 ENCOUNTER — Emergency Department (HOSPITAL_COMMUNITY): Payer: Medicaid Other

## 2023-05-25 DIAGNOSIS — I130 Hypertensive heart and chronic kidney disease with heart failure and stage 1 through stage 4 chronic kidney disease, or unspecified chronic kidney disease: Secondary | ICD-10-CM | POA: Diagnosis present

## 2023-05-25 DIAGNOSIS — J189 Pneumonia, unspecified organism: Principal | ICD-10-CM | POA: Diagnosis present

## 2023-05-25 DIAGNOSIS — E785 Hyperlipidemia, unspecified: Secondary | ICD-10-CM | POA: Diagnosis present

## 2023-05-25 DIAGNOSIS — Z91013 Allergy to seafood: Secondary | ICD-10-CM | POA: Diagnosis not present

## 2023-05-25 DIAGNOSIS — I252 Old myocardial infarction: Secondary | ICD-10-CM | POA: Diagnosis not present

## 2023-05-25 DIAGNOSIS — I2510 Atherosclerotic heart disease of native coronary artery without angina pectoris: Secondary | ICD-10-CM | POA: Diagnosis present

## 2023-05-25 DIAGNOSIS — F17210 Nicotine dependence, cigarettes, uncomplicated: Secondary | ICD-10-CM | POA: Diagnosis present

## 2023-05-25 DIAGNOSIS — I5022 Chronic systolic (congestive) heart failure: Secondary | ICD-10-CM | POA: Diagnosis present

## 2023-05-25 DIAGNOSIS — Z833 Family history of diabetes mellitus: Secondary | ICD-10-CM

## 2023-05-25 DIAGNOSIS — Z91148 Patient's other noncompliance with medication regimen for other reason: Secondary | ICD-10-CM

## 2023-05-25 DIAGNOSIS — Z91199 Patient's noncompliance with other medical treatment and regimen due to unspecified reason: Secondary | ICD-10-CM

## 2023-05-25 DIAGNOSIS — M546 Pain in thoracic spine: Secondary | ICD-10-CM | POA: Diagnosis not present

## 2023-05-25 DIAGNOSIS — Z5329 Procedure and treatment not carried out because of patient's decision for other reasons: Secondary | ICD-10-CM | POA: Diagnosis not present

## 2023-05-25 DIAGNOSIS — Z1152 Encounter for screening for COVID-19: Secondary | ICD-10-CM | POA: Diagnosis not present

## 2023-05-25 DIAGNOSIS — I639 Cerebral infarction, unspecified: Secondary | ICD-10-CM | POA: Insufficient documentation

## 2023-05-25 DIAGNOSIS — F1410 Cocaine abuse, uncomplicated: Secondary | ICD-10-CM | POA: Diagnosis present

## 2023-05-25 DIAGNOSIS — Z7902 Long term (current) use of antithrombotics/antiplatelets: Secondary | ICD-10-CM | POA: Diagnosis not present

## 2023-05-25 DIAGNOSIS — F1010 Alcohol abuse, uncomplicated: Secondary | ICD-10-CM | POA: Diagnosis present

## 2023-05-25 DIAGNOSIS — N182 Chronic kidney disease, stage 2 (mild): Secondary | ICD-10-CM | POA: Diagnosis present

## 2023-05-25 DIAGNOSIS — Z7984 Long term (current) use of oral hypoglycemic drugs: Secondary | ICD-10-CM | POA: Diagnosis not present

## 2023-05-25 DIAGNOSIS — R000 Tachycardia, unspecified: Secondary | ICD-10-CM | POA: Diagnosis present

## 2023-05-25 DIAGNOSIS — M545 Low back pain, unspecified: Secondary | ICD-10-CM | POA: Diagnosis present

## 2023-05-25 DIAGNOSIS — J45909 Unspecified asthma, uncomplicated: Secondary | ICD-10-CM | POA: Diagnosis present

## 2023-05-25 DIAGNOSIS — Z79899 Other long term (current) drug therapy: Secondary | ICD-10-CM

## 2023-05-25 DIAGNOSIS — Z5900 Homelessness unspecified: Secondary | ICD-10-CM

## 2023-05-25 DIAGNOSIS — Z7901 Long term (current) use of anticoagulants: Secondary | ICD-10-CM

## 2023-05-25 DIAGNOSIS — M7989 Other specified soft tissue disorders: Secondary | ICD-10-CM | POA: Diagnosis not present

## 2023-05-25 DIAGNOSIS — I255 Ischemic cardiomyopathy: Secondary | ICD-10-CM | POA: Diagnosis present

## 2023-05-25 DIAGNOSIS — Z7982 Long term (current) use of aspirin: Secondary | ICD-10-CM

## 2023-05-25 DIAGNOSIS — I6389 Other cerebral infarction: Secondary | ICD-10-CM | POA: Diagnosis not present

## 2023-05-25 DIAGNOSIS — R079 Chest pain, unspecified: Principal | ICD-10-CM

## 2023-05-25 DIAGNOSIS — Z91010 Allergy to peanuts: Secondary | ICD-10-CM | POA: Diagnosis not present

## 2023-05-25 DIAGNOSIS — R7989 Other specified abnormal findings of blood chemistry: Secondary | ICD-10-CM

## 2023-05-25 DIAGNOSIS — Z8673 Personal history of transient ischemic attack (TIA), and cerebral infarction without residual deficits: Secondary | ICD-10-CM | POA: Diagnosis not present

## 2023-05-25 LAB — TROPONIN I (HIGH SENSITIVITY)
Troponin I (High Sensitivity): 74 ng/L — ABNORMAL HIGH (ref <18)
Troponin I (High Sensitivity): 59 ng/L — ABNORMAL HIGH (ref <18)

## 2023-05-25 LAB — CBC WITH DIFFERENTIAL/PLATELET
Abs Immature Granulocytes: 0.03 10*3/uL (ref 0.00–0.07)
Basophils Absolute: 0 10*3/uL (ref 0.0–0.1)
Basophils Relative: 0 %
Eosinophils Absolute: 0 10*3/uL (ref 0.0–0.5)
Eosinophils Relative: 0 %
HCT: 37.3 % — ABNORMAL LOW (ref 39.0–52.0)
Hemoglobin: 12 g/dL — ABNORMAL LOW (ref 13.0–17.0)
Immature Granulocytes: 0 %
Lymphocytes Relative: 8 %
Lymphs Abs: 0.9 10*3/uL (ref 0.7–4.0)
MCH: 30.6 pg (ref 26.0–34.0)
MCHC: 32.2 g/dL (ref 30.0–36.0)
MCV: 95.2 fL (ref 80.0–100.0)
Monocytes Absolute: 0.6 10*3/uL (ref 0.1–1.0)
Monocytes Relative: 6 %
Neutro Abs: 8.7 10*3/uL — ABNORMAL HIGH (ref 1.7–7.7)
Neutrophils Relative %: 86 %
Platelets: 238 10*3/uL (ref 150–400)
RBC: 3.92 MIL/uL — ABNORMAL LOW (ref 4.22–5.81)
RDW: 14.3 % (ref 11.5–15.5)
WBC: 10.3 10*3/uL (ref 4.0–10.5)
nRBC: 0 % (ref 0.0–0.2)

## 2023-05-25 LAB — CBC
HCT: 36.8 % — ABNORMAL LOW (ref 39.0–52.0)
Hemoglobin: 12 g/dL — ABNORMAL LOW (ref 13.0–17.0)
MCH: 30.3 pg (ref 26.0–34.0)
MCHC: 32.6 g/dL (ref 30.0–36.0)
MCV: 92.9 fL (ref 80.0–100.0)
Platelets: 243 10*3/uL (ref 150–400)
RBC: 3.96 MIL/uL — ABNORMAL LOW (ref 4.22–5.81)
RDW: 14.4 % (ref 11.5–15.5)
WBC: 10.3 10*3/uL (ref 4.0–10.5)
nRBC: 0 % (ref 0.0–0.2)

## 2023-05-25 LAB — CREATININE, SERUM
Creatinine, Ser: 1.53 mg/dL — ABNORMAL HIGH (ref 0.61–1.24)
GFR, Estimated: 55 mL/min — ABNORMAL LOW (ref >60)

## 2023-05-25 LAB — BASIC METABOLIC PANEL
Anion gap: 14 (ref 5–15)
BUN: 22 mg/dL — ABNORMAL HIGH (ref 6–20)
CO2: 22 mmol/L (ref 22–32)
Calcium: 9.3 mg/dL (ref 8.9–10.3)
Chloride: 99 mmol/L (ref 98–111)
Creatinine, Ser: 1.48 mg/dL — ABNORMAL HIGH (ref 0.61–1.24)
GFR, Estimated: 57 mL/min — ABNORMAL LOW (ref >60)
Glucose, Bld: 111 mg/dL — ABNORMAL HIGH (ref 70–99)
Potassium: 4.4 mmol/L (ref 3.5–5.1)
Sodium: 135 mmol/L (ref 135–145)

## 2023-05-25 LAB — BRAIN NATRIURETIC PEPTIDE: B Natriuretic Peptide: 1346.6 pg/mL — ABNORMAL HIGH (ref 0.0–100.0)

## 2023-05-25 LAB — SARS CORONAVIRUS 2 BY RT PCR: SARS Coronavirus 2 by RT PCR: NEGATIVE

## 2023-05-25 MED ORDER — ASPIRIN 81 MG PO CHEW
324.0000 mg | CHEWABLE_TABLET | Freq: Once | ORAL | Status: AC
  Administered 2023-05-25: 324 mg via ORAL
  Filled 2023-05-25: qty 4

## 2023-05-25 MED ORDER — OXYCODONE HCL 5 MG PO TABS
5.0000 mg | ORAL_TABLET | ORAL | Status: DC | PRN
  Administered 2023-05-25 – 2023-05-28 (×11): 5 mg via ORAL
  Filled 2023-05-25 (×11): qty 1

## 2023-05-25 MED ORDER — STROKE: EARLY STAGES OF RECOVERY BOOK
Freq: Once | Status: AC
Start: 2023-05-26 — End: 2023-05-26
  Filled 2023-05-25: qty 1

## 2023-05-25 MED ORDER — ATORVASTATIN CALCIUM 40 MG PO TABS
40.0000 mg | ORAL_TABLET | Freq: Every day | ORAL | Status: DC
  Administered 2023-05-25 – 2023-05-31 (×7): 40 mg via ORAL
  Filled 2023-05-25 (×7): qty 1

## 2023-05-25 MED ORDER — ACETAMINOPHEN 325 MG PO TABS
650.0000 mg | ORAL_TABLET | Freq: Four times a day (QID) | ORAL | Status: DC | PRN
  Administered 2023-05-26 – 2023-05-29 (×4): 650 mg via ORAL
  Filled 2023-05-25 (×4): qty 2

## 2023-05-25 MED ORDER — SODIUM CHLORIDE 0.9 % IV SOLN
3.0000 g | Freq: Three times a day (TID) | INTRAVENOUS | Status: AC
  Administered 2023-05-25 – 2023-05-31 (×18): 3 g via INTRAVENOUS
  Filled 2023-05-25 (×18): qty 8

## 2023-05-25 MED ORDER — SODIUM CHLORIDE 0.9% FLUSH
3.0000 mL | Freq: Two times a day (BID) | INTRAVENOUS | Status: DC
  Administered 2023-05-26 – 2023-05-31 (×8): 3 mL via INTRAVENOUS

## 2023-05-25 MED ORDER — DEXTROSE-SODIUM CHLORIDE 5-0.9 % IV SOLN: INTRAVENOUS | Status: AC

## 2023-05-25 MED ORDER — ACETAMINOPHEN 650 MG RE SUPP: 650.0000 mg | Freq: Four times a day (QID) | RECTAL | Status: DC | PRN

## 2023-05-25 MED ORDER — POLYETHYLENE GLYCOL 3350 17 G PO PACK: 17.0000 g | PACK | Freq: Every day | ORAL | Status: DC | PRN

## 2023-05-25 MED ORDER — EMPAGLIFLOZIN 10 MG PO TABS
10.0000 mg | ORAL_TABLET | Freq: Every day | ORAL | Status: DC
  Administered 2023-05-26 – 2023-05-31 (×6): 10 mg via ORAL
  Filled 2023-05-25 (×7): qty 1

## 2023-05-25 MED ORDER — NITROGLYCERIN 0.4 MG SL SUBL
0.4000 mg | SUBLINGUAL_TABLET | SUBLINGUAL | Status: DC | PRN
  Administered 2023-05-25: 0.4 mg via SUBLINGUAL
  Filled 2023-05-25: qty 1

## 2023-05-25 MED ORDER — CEFTRIAXONE SODIUM 1 G IJ SOLR
1.0000 g | Freq: Once | INTRAMUSCULAR | Status: AC
  Administered 2023-05-25: 1 g via INTRAVENOUS
  Filled 2023-05-25: qty 10

## 2023-05-25 MED ORDER — CLOPIDOGREL BISULFATE 75 MG PO TABS
75.0000 mg | ORAL_TABLET | Freq: Every day | ORAL | Status: DC
  Administered 2023-05-25 – 2023-05-31 (×7): 75 mg via ORAL
  Filled 2023-05-25 (×7): qty 1

## 2023-05-25 MED ORDER — DOXYCYCLINE HYCLATE 100 MG IV SOLR
100.0000 mg | Freq: Two times a day (BID) | INTRAVENOUS | Status: DC
  Administered 2023-05-26 – 2023-05-28 (×5): 100 mg via INTRAVENOUS
  Filled 2023-05-25 (×5): qty 100

## 2023-05-25 MED ORDER — DOXYCYCLINE HYCLATE 100 MG IV SOLR
100.0000 mg | Freq: Once | INTRAVENOUS | Status: AC
  Administered 2023-05-25: 100 mg via INTRAVENOUS
  Filled 2023-05-25: qty 100

## 2023-05-25 MED ORDER — ENOXAPARIN SODIUM 40 MG/0.4ML IJ SOSY
40.0000 mg | PREFILLED_SYRINGE | INTRAMUSCULAR | Status: DC
  Administered 2023-05-26 – 2023-05-31 (×6): 40 mg via SUBCUTANEOUS
  Filled 2023-05-25 (×6): qty 0.4

## 2023-05-25 MED ORDER — DIAZEPAM 5 MG/ML IJ SOLN
5.0000 mg | Freq: Once | INTRAMUSCULAR | Status: AC
  Administered 2023-05-26: 5 mg via INTRAVENOUS
  Filled 2023-05-25: qty 2

## 2023-05-25 NOTE — ED Provider Notes (Signed)
Patient is a 52 year old male with a past medical history of CAD, CHF, prior CVA, polysubstance use and homelessness presenting to the emergency department and chest pain and shortness of breath.  Patient was initially evaluated by Dr. Renaye Rakers and signed out to me at 1500 pending labs and CT read.  Patient has been mildly tachycardic but satting well on room air.  Chest x-ray does show concern for right lower lobe pneumonia.  Initial troponin mildly elevated at 74, repeat troponin is pending.  BNP is elevated at 1300 with possible pulmonary edema on chest x-ray, however is significantly lower than his normal BNP and does not appear significantly volume overloaded on exam.  CT head does show concern for acute stroke and neurology will be consulted.  He has mild slurred speech and 4 out of 5 strength in the right arm which she states is slightly worse compared to usual although his exam is somewhat limited secondary to effort.  Clinical Course as of 05/25/23 1729  Sat May 25, 2023  1523 Pt is signed out to Dr Elayne Snare EDP pending follow up on neuro imaging and cardiac /chestpain workup [MT]  1558 I received call from radiology. CTH with acute stroke in the L frontal parietal region. Neurology will be consulted. [VK]  1701 I spoke with Dr. Tempie Hoist who recommended MRI to evaluate for acute stroke vs recrudescence prior to starting full stroke work up. Due to patient's non-compliance, though, will be admitted for pneumonia treatment and MRI. [VK]  1729 Has been non-compliant with meds for past month. [VK]    Clinical Course User Index [MT] Terald Sleeper, MD [VK] Rexford Maus, DO      Rexford Maus, [place] 05/25/23 1729

## 2023-05-25 NOTE — Assessment & Plan Note (Addendum)
Patient has previously documented several chronic strokes, patient has been having garbled speech for the last 2 3 days, that is concerning for recrudescence of patient's prior cerebral infarction symptoms.  However CAT scan is showing acute left frontoparietal infarction this has been discussed with the neurology service by the ER attending.  At this time plan would be to proceed with doing an MRI to further try to determine the acuity of this infarction.  Further workup will be done accordingly.  I will order a swallow screen in the interim.  Patient is supposed to be on chronic apixaban for his prior embolic strokes.  However has been poorly catheter into this regimen.  Once the acute MRI is negative for acute large infarction, I plan to restart patient's apixaban.

## 2023-05-25 NOTE — ED Triage Notes (Signed)
PT was seen by EMS and t refused to go with EMS. EMS was called out again for chest pain and SOB. Pain is worse with breathing and exertion. Pt A&OX4

## 2023-05-25 NOTE — ED Notes (Signed)
Report received, pt transported to MRI at this time.

## 2023-05-25 NOTE — ED Provider Notes (Signed)
[place] EMERGENCY DEPARTMENT AT [facility] Provider Note   CSN: [id number] Arrival date & time: 05/25/23  1320     History  Chief Complaint  Patient presents with   Chest Pain    Craig Schmidt is a 52 y.o. male with history of ischemic cardiomyopathy, high blood pressure, presented to ED with shortness of breath and chest pain.  Patient reports he was standing in line today and began having chest pressure which is extreme.  He says he does not typically have chest pain or pressure.  He is homeless.  He does not have any medications and not had these for a long time.  He is admitted to the hospital for ischemic workup in the past and is felt to be high risk for cardiovascular disease.  He does endorse smoking as well as cocaine and illicit drug use recently (he does not want this information disclosed to his sister or family, but okay to discuss in front of his girlfriend present at bedside).  Patient has also had a productive cough and has a history of pneumonia according to him and his girlfriend  His girlfriend was present at the bedside reports that the patient has a history of prior stroke and has been having right arm numbness intermittently as well as on and off problems with speech, where he will become "difficult to understand".  I reviewed his external records.  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    HPI     Home Medications Prior to Admission medications   Medication Sig Start Date End Date Taking? Authorizing Provider  apixaban (ELIQUIS) 5 MG TABS tablet Take 1 tablet (5 mg total) by mouth 2 (two) times daily. 02/05/23   Milford, Anderson Malta, [name]  atorvastatin (LIPITOR) 40 MG tablet Take 1  tablet (40 mg total) by mouth daily. 02/05/23   Jacklynn Ganong, [name]  Blood Pressure Monitoring (OMRON 3 SERIES BP MONITOR) DEVI Use as directed Patient not taking: Reported on 03/07/2023 02/05/23     carvedilol (COREG) 3.125 MG tablet Take 1 tablet (3.125 mg total) by mouth 2 (two) times daily. Patient not taking: Reported on 03/07/2023 02/05/23   Jacklynn Ganong, [name]  clopidogrel (PLAVIX) 75 MG tablet Take 1 tablet (75 mg total) by mouth daily. 02/05/23   Milford, Anderson Malta, [name]  empagliflozin (JARDIANCE) 10 MG TABS tablet Take 1 tablet (10 mg total) by mouth daily. 12/04/22   Zannie Cove, MD  furosemide (LASIX) 40 MG tablet Take 2 tablets (80 mg total) by mouth daily. 02/05/23 05/25/23  Jacklynn Ganong, [name]  isosorbide-hydrALAZINE (BIDIL) 20-37.5 MG tablet Take 1/2 tablet by mouth 3 (three) times daily. 01/23/23   Milford, Anderson Malta, [name]  metolazone (ZAROXOLYN) 2.5 MG tablet Take 1 tablet (2.5 mg total) by mouth as directed. Patient not taking: Reported on 03/07/2023 01/23/23 04/23/23  Jacklynn Ganong, [name]  potassium chloride SA (KLOR-CON M) 20 MEQ tablet Take 2 tablets (40 mEq total) by mouth daily. Patient not taking: Reported on 03/07/2023 02/05/23   Jacklynn Ganong, [name]  triamcinolone ointment (KENALOG) 0.5 % Apply 1 Application topically 2 (two) times daily. Patient taking differently: Apply 1 Application topically 2 (two) times daily. As needed 03/06/23   Rema Fendt, NP  Allergies    Shellfish allergy and Peanut-containing drug products    Review of Systems   Review of Systems  Physical Exam Updated Vital Signs BP 119/85   Pulse 100   Temp 97.9 F (36.6 C) (Oral)   Resp (!) 24   Ht 5\' 7"  (1.702 m)   Wt 68.9 kg   SpO2 99%   BMI 23.79 kg/m  Physical Exam Constitutional:      Comments: Speech is difficult to understand, patient is holding chest, complaining of chest pain  HENT:     Head: Normocephalic and atraumatic.  Eyes:     Conjunctiva/sclera: Conjunctivae  normal.     Pupils: Pupils are equal, round, and reactive to light.  Cardiovascular:     Rate and Rhythm: Regular rhythm. Tachycardia present.  Pulmonary:     Effort: Pulmonary effort is normal. No respiratory distress.  Abdominal:     General: There is no distension.     Tenderness: There is no abdominal tenderness.  Skin:    General: Skin is warm and dry.  Neurological:     General: No focal deficit present.     Mental Status: He is alert. Mental status is at baseline.     ED Results / Procedures / Treatments   Labs (all labs ordered are listed, but only abnormal results are displayed) Labs Reviewed  BASIC METABOLIC PANEL - Abnormal; Notable for the following components:      Result Value   Glucose, Bld 111 (*)    BUN 22 (*)    Creatinine, Ser 1.48 (*)    GFR, Estimated 57 (*)    All other components within normal limits  CBC WITH DIFFERENTIAL/PLATELET - Abnormal; Notable for the following components:   RBC 3.92 (*)    Hemoglobin 12.0 (*)    HCT 37.3 (*)    Neutro Abs 8.7 (*)    All other components within normal limits  TROPONIN I (HIGH SENSITIVITY) - Abnormal; Notable for the following components:   Troponin I (High Sensitivity) 74 (*)    All other components within normal limits  BRAIN NATRIURETIC PEPTIDE  RAPID URINE DRUG SCREEN, HOSP PERFORMED    EKG EKG Interpretation Date/Time:  Saturday May 25 2023 13:25:20 EDT Ventricular Rate:  109 PR Interval:  136 QRS Duration:  96 QT Interval:  384 QTC Calculation: 517 R Axis:   24  Text Interpretation: Sinus tachycardia Biatrial enlargement Left ventricular hypertrophy with repolarization abnormality ( Cornell product ) Abnormal ECG When compared with ECG of 05-Feb-2023 11:36, PREVIOUS ECG IS PRESENT No significant changes Confirmed by Alvester Chou [phone]) on 05/25/2023 1:49:47 PM  Radiology DG Chest 2 View  Result Date: 05/25/2023 CLINICAL DATA:  Chest pain EXAM: CHEST - 2 VIEW COMPARISON:  Chest x-ray  dated Dec 02, 2022 FINDINGS: Unchanged cardiomegaly. New consolidation of the anterior right lower lobe. Left lower lobe consolidation, also seen on prior exam. New mild diffuse interstitial opacities. No evidence of pneumothorax. No pleural effusion. IMPRESSION: 1. New consolidation of the anterior right lower lobe, concerning for infection or aspiration. 2. Left lower lobe consolidation. Further evaluation with chest CT recommended given lack of resolution when compared with the prior. 3. New mild diffuse interstitial opacities which could be due to a component of pulmonary edema. 4. Cardiomegaly. Electronically Signed   By: Allegra Lai M.D.   On: 05/25/2023 15:18    Procedures Procedures    Medications Ordered in ED Medications  nitroGLYCERIN (NITROSTAT) SL tablet 0.4 mg (0.4 mg  Sublingual Given 05/25/23 1507)  cefTRIAXone (ROCEPHIN) 1 g in sodium chloride 0.9 % 100 mL IVPB (has no administration in time range)  doxycycline (VIBRAMYCIN) 100 mg in dextrose 5 % 250 mL IVPB (has no administration in time range)  aspirin chewable tablet 324 mg (324 mg Oral Given 05/25/23 1507)    ED Course/ Medical Decision Making/ A&P Clinical Course as of 05/25/23 1525  Sat May 25, 2023  1523 Pt is signed out to Dr Elayne Snare EDP pending follow up on neuro imaging and cardiac /chestpain workup [MT]    Clinical Course User Index [MT] Terald Sleeper, MD                                 Medical Decision Making Amount and/or Complexity of Data Reviewed Labs: ordered. Radiology: ordered.  Risk OTC drugs. Prescription drug management.   This patient presents to the ED with concern for chest pain. This involves an extensive number of treatment options, and is a complaint that carries with it a high risk of complications and morbidity.  The differential diagnosis includes cocaine induced chest pain versus ischemic cardiomyopathy or ACS versus pneumonia versus pneumothorax versus other  The  patient speech is difficult to understand on arrival but his girlfriend at the bedside says that this is a frequent occurrence with him, it is not clear whether this is truly CNS dysarthria or stroke - but onset appears to have been outset a window of intervention.  We'll check CT head.  His symptoms seem more consistent with an intrathoracic process such as pneumonia or coronary syndrome or cocaine induced chest pain  Co-morbidities that complicate the patient evaluation: Multiple cardiovascular risk factors including high blood pressure, high cholesterol, ischemic cardiomyopathy, not currently taking medications  Additional history obtained from patient's girlfriend at the bedside  External records from outside source obtained and reviewed including prior MRI of the brain showing multiple infarcts in watershed territory, left heart catheterization from May 2023 showing occlusion of the apical LAD which is suspected embolic in nature, very left-ventricular systolic dysfunction, EF of 25% by visual estimate.  More recent echocardiogram in May 2024 showed an EF of 25 to 30% with severe decreased left ventricular function and mitral stenosis  I ordered and personally interpreted labs.  The pertinent results include: Initial troponin mildly elevated at 74.  This is chronically elevated.  We blood cell count 10.3.  BMP near baseline levels.  I ordered imaging studies including x-ray of the chest, CT head I independently visualized and interpreted imaging which showed concern for consolidation or pneumonia; CT head was pending at the time of signout to provider I agree with the radiologist interpretation  The patient was maintained on a cardiac monitor.  I personally viewed and interpreted the cardiac monitored which showed an underlying rhythm of: Sinus tachycardia  Per my interpretation the patient's ECG shows sinus tachycardia with no significant morphology changes from prior tracing  I ordered  medication including aspirin & SL nitroglycerin and for suspected ischemic or anginal chest pain  I have reviewed the patients home medicines and have made adjustments as needed  Test Considered: Low suspicion for acute PE in this clinical setting or aortic dissection  After the interventions noted above, I reevaluated the patient and found that they have: stayed the same  Social Determinants of Health: patient is homeless and is not able to afford medications, may need financial assistance  while in hospital  Dispostion:  Signed out pending completion of ED workup and likely admission to hospital         Final Clinical Impression(s) / ED Diagnoses Final diagnoses:  Chest pain, unspecified type  Pneumonia due to infectious organism, unspecified laterality, unspecified part of lung    Rx / DC Orders ED Discharge Orders     None         Terald Sleeper, MD 05/25/23 1525

## 2023-05-25 NOTE — H&P (Signed)
History and Physical    Patient: Craig Schmidt:[id number] DOB: Aug 10, 1970 DOA: 05/25/2023 DOS: the patient was seen and examined on 05/25/2023 PCP: Rema Fendt, NP  Patient coming from: Homeless  Chief Complaint:  Chief Complaint  Patient presents with   Chest Pain   HPI: Craig Schmidt is a 52 y.o. male with medical history significant of prior strokes, medication noncompliance, alcohol abuse,  Patient reports being in his usual state of health is accompanied by his wife today.  Reports new onset of chest pain right costal area deep worsening with inspiration.  Associated with sensation of shortness of breath and cough.  Occasionally productive sputum.  No documented fever, however patient has been feeling hot and cold.  There is no leg swelling reported.  Additionally patient has some chronic right upper extremity weakness from prior strokes, this weakness has been worsening over the last 2 or 3 days with associated sensation of numbness and tingling.  And the patient has been having occasional garbled speech for the same duration of time.  There is no trauma reported including no neck pain or spinal pain.  Patient came to the ER for further evaluation today, medical evaluation is sought. Review of Systems: As mentioned in the history of present illness. All other systems reviewed and are negative. Past Medical History:  Diagnosis Date   Alcohol abuse    Asthma    Brain tumor (HCC)    CAD (coronary artery disease)    Chronic HFrEF (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)    Chronic kidney disease, stage 3 (HCC)    Cocaine abuse (HCC)    History of medication noncompliance    Homelessness    MI (myocardial infarction) (HCC)    STEMI (ST elevation myocardial infarction) (HCC)    Stroke [facility])    Past Surgical History:  Procedure Laterality Date   BUBBLE STUDY  09/11/2021   Procedure: BUBBLE STUDY;  Surgeon: Pricilla Riffle, MD;  Location: [facility] ENDOSCOPY;   Service: Cardiovascular;;   LEFT HEART CATH AND CORONARY ANGIOGRAPHY N/A 12/19/2021   Procedure: LEFT HEART CATH AND CORONARY ANGIOGRAPHY;  Surgeon: Lyn Records, MD;  Location: [facility] INVASIVE CV LAB;  Service: Cardiovascular;  Laterality: N/A;   RIGHT/LEFT HEART CATH AND CORONARY ANGIOGRAPHY N/A 04/13/2022   Procedure: RIGHT/LEFT HEART CATH AND CORONARY ANGIOGRAPHY;  Surgeon: Laurey Morale, MD;  Location: [facility] INVASIVE CV LAB;  Service: Cardiovascular;  Laterality: N/A;   TEE WITHOUT CARDIOVERSION N/A 09/11/2021   Procedure: TRANSESOPHAGEAL ECHOCARDIOGRAM (TEE);  Surgeon: Pricilla Riffle, MD;  Location: [facility] ENDOSCOPY;  Service: Cardiovascular;  Laterality: N/A;   Social History:  reports that he has been smoking cigarettes. He has a 41 pack-year smoking history. He has been exposed to tobacco smoke. He has never used smokeless tobacco. He reports current alcohol use of about 63.0 standard drinks of alcohol per week. He reports current drug use. Drugs: "Crack" cocaine and Cocaine.  Allergies  Allergen Reactions   Shellfish Allergy Anaphylaxis   Peanut-Containing Drug Products Itching    Family History  Problem Relation Age of Onset   Diabetes Mother     Prior to Admission medications   Medication Sig Start Date End Date Taking? Authorizing Provider  apixaban (ELIQUIS) 5 MG TABS tablet Take 1 tablet (5 mg total) by mouth 2 (two) times daily. Patient not taking: Reported on 05/25/2023 02/05/23   Jacklynn Ganong, [name]  atorvastatin (LIPITOR) 40 MG tablet Take 1 tablet (40 mg total)  by mouth daily. Patient not taking: Reported on 05/25/2023 02/05/23   Jacklynn Ganong, [name]  Blood Pressure Monitoring (OMRON 3 SERIES BP MONITOR) DEVI Use as directed Patient not taking: Reported on 03/07/2023 02/05/23     carvedilol (COREG) 3.125 MG tablet Take 1 tablet (3.125 mg total) by mouth 2 (two) times daily. Patient not taking: Reported on 03/07/2023 02/05/23   Jacklynn Ganong, [name]  clopidogrel (PLAVIX) 75  MG tablet Take 1 tablet (75 mg total) by mouth daily. Patient not taking: Reported on 05/25/2023 02/05/23   Jacklynn Ganong, [name]  empagliflozin (JARDIANCE) 10 MG TABS tablet Take 1 tablet (10 mg total) by mouth daily. Patient not taking: Reported on 05/25/2023 12/04/22   Zannie Cove, MD  furosemide (LASIX) 40 MG tablet Take 2 tablets (80 mg total) by mouth daily. Patient not taking: Reported on 05/25/2023 02/05/23 05/25/23  Jacklynn Ganong, [name]  isosorbide-hydrALAZINE (BIDIL) 20-37.5 MG tablet Take 1/2 tablet by mouth 3 (three) times daily. Patient not taking: Reported on 05/25/2023 01/23/23   Jacklynn Ganong, [name]  metolazone (ZAROXOLYN) 2.5 MG tablet Take 1 tablet (2.5 mg total) by mouth as directed. Patient not taking: Reported on 03/07/2023 01/23/23 04/23/23  Jacklynn Ganong, [name]  potassium chloride SA (KLOR-CON M) 20 MEQ tablet Take 2 tablets (40 mEq total) by mouth daily. Patient not taking: Reported on 03/07/2023 02/05/23   Jacklynn Ganong, [name]  triamcinolone ointment (KENALOG) 0.5 % Apply 1 Application topically 2 (two) times daily. Patient not taking: Reported on 05/25/2023 03/06/23   Rema Fendt, NP    Physical Exam: Vitals:   05/25/23 1332 05/25/23 1447 05/25/23 1600 05/25/23 1744  BP: 125/85 119/85 110/82 128/88  Pulse: (!) 106 100 (!) 110 97  Resp: 16 (!) 24 (!) 22 20  Temp: 97.9 F (36.6 C)     TempSrc: Oral     SpO2: 95% 99% 98% 99%  Weight:      Height:       General: Patient appears fatigued/sick.  However gives the history himself and is reasonably coherent, wife at bedside not concerned about mental status. Respiratory exam: Bilateral air entry vesicular no no crackles are heard Cardiovascular exam S1-S2 normal Abdomen all quadrants are soft nontender Extremities warm without edema. Patient does not demonstrate antigravity strength of the right arm.  Otherwise no focal deficit is appreciated.  Patient is fatigued and has poor strength in bilateral lower  extremities. Data Reviewed:  Labs on Admission:  Results for orders placed or performed during the hospital encounter of 05/25/23 (from the past 24 hour(s))  Troponin I (High Sensitivity)     Status: Abnormal   Collection Time: 05/25/23  1:39 PM  Result Value Ref Range   Troponin I (High Sensitivity) 74 (H) <18 ng/L  Basic metabolic panel     Status: Abnormal   Collection Time: 05/25/23  1:39 PM  Result Value Ref Range   Sodium 135 135 - 145 mmol/L   Potassium 4.4 3.5 - 5.1 mmol/L   Chloride 99 98 - 111 mmol/L   CO2 22 22 - 32 mmol/L   Glucose, Bld 111 (H) 70 - 99 mg/dL   BUN 22 (H) 6 - 20 mg/dL   Creatinine, Ser 1.61 (H) 0.61 - 1.24 mg/dL   Calcium 9.3 8.9 - 09.6 mg/dL   GFR, Estimated 57 (L) >60 mL/min   Anion gap 14 5 - 15  CBC with Differential     Status: Abnormal   Collection Time:  05/25/23  1:39 PM  Result Value Ref Range   WBC 10.3 4.0 - 10.5 K/uL   RBC 3.92 (L) 4.22 - 5.81 MIL/uL   Hemoglobin 12.0 (L) 13.0 - 17.0 g/dL   HCT 16.1 (L) 09.6 - 04.5 %   MCV 95.2 80.0 - 100.0 fL   MCH 30.6 26.0 - 34.0 pg   MCHC 32.2 30.0 - 36.0 g/dL   RDW 40.9 81.1 - 91.4 %   Platelets 238 150 - 400 K/uL   nRBC 0.0 0.0 - 0.2 %   Neutrophils Relative % 86 %   Neutro Abs 8.7 (H) 1.7 - 7.7 K/uL   Lymphocytes Relative 8 %   Lymphs Abs 0.9 0.7 - 4.0 K/uL   Monocytes Relative 6 %   Monocytes Absolute 0.6 0.1 - 1.0 K/uL   Eosinophils Relative 0 %   Eosinophils Absolute 0.0 0.0 - 0.5 K/uL   Basophils Relative 0 %   Basophils Absolute 0.0 0.0 - 0.1 K/uL   Immature Granulocytes 0 %   Abs Immature Granulocytes 0.03 0.00 - 0.07 K/uL  Brain natriuretic peptide     Status: Abnormal   Collection Time: 05/25/23  2:39 PM  Result Value Ref Range   B Natriuretic Peptide 1,346.6 (H) 0.0 - 100.0 pg/mL   Basic Metabolic Panel: Recent Labs  Lab 05/25/23 1339  NA 135  K 4.4  CL 99  CO2 22  GLUCOSE 111*  BUN 22*  CREATININE 1.48*  CALCIUM 9.3   Liver Function Tests: No results for  input(s): "AST", "ALT", "ALKPHOS", "BILITOT", "PROT", "ALBUMIN" in the last 168 hours. No results for input(s): "LIPASE", "AMYLASE" in the last 168 hours. No results for input(s): "AMMONIA" in the last 168 hours. CBC: Recent Labs  Lab 05/25/23 1339  WBC 10.3  NEUTROABS 8.7*  HGB 12.0*  HCT 37.3*  MCV 95.2  PLT 238   Cardiac Enzymes: Recent Labs  Lab 05/25/23 1339  TROPONINIHS 74*    BNP (last 3 results) No results for input(s): "PROBNP" in the last 8760 hours. CBG: No results for input(s): "GLUCAP" in the last 168 hours.  Radiological Exams on Admission:  CT Head Wo Contrast  Result Date: 05/25/2023 CLINICAL DATA:  Neuro deficit, acute, stroke suspected Mental status change, unknown cause right arm paresthesia EXAM: CT HEAD WITHOUT CONTRAST TECHNIQUE: Contiguous axial images were obtained from the base of the skull through the vertex without intravenous contrast. RADIATION DOSE REDUCTION: This exam was performed according to the departmental dose-optimization program which includes automated exposure control, adjustment of the mA and/or kV according to patient size and/or use of iterative reconstruction technique. COMPARISON:  CT head 10/03/2021 FINDINGS: Brain: Cerebral ventricle sizes are concordant with the degree of cerebral volume loss. Interval development of loss of left frontal gray-white matter differentiation along the precentral gyrus and anteriorly suggestive of acute infarction. Finding extends minimally to the postcentral gyrus. No parenchymal hemorrhage. No mass lesion. No extra-axial collection. No mass effect or midline shift. No hydrocephalus. Basilar cisterns are patent. Mega cisterna magna. Vascular: No hyperdense vessel. Skull: No acute fracture or focal lesion. Sinuses/Orbits: Paranasal sinuses and mastoid air cells are clear. The orbits are unremarkable. Other: None. IMPRESSION: 1. Acute left frontoparietal infarction. Recommend MRI noncontrast for further  evaluation 2. No acute intracranial hemorrhage. These results were called by telephone at the time of interpretation on 05/25/2023 at 3:58 pm to provider Rexford Maus, DO, who verbally acknowledged these results. Electronically Signed   By: Normajean Glasgow.D.  On: 05/25/2023 15:58   DG Chest 2 View  Result Date: 05/25/2023 CLINICAL DATA:  Chest pain EXAM: CHEST - 2 VIEW COMPARISON:  Chest x-ray dated Dec 02, 2022 FINDINGS: Unchanged cardiomegaly. New consolidation of the anterior right lower lobe. Left lower lobe consolidation, also seen on prior exam. New mild diffuse interstitial opacities. No evidence of pneumothorax. No pleural effusion. IMPRESSION: 1. New consolidation of the anterior right lower lobe, concerning for infection or aspiration. 2. Left lower lobe consolidation. Further evaluation with chest CT recommended given lack of resolution when compared with the prior. 3. New mild diffuse interstitial opacities which could be due to a component of pulmonary edema. 4. Cardiomegaly. Electronically Signed   By: Allegra Lai M.D.   On: 05/25/2023 15:18    EKG: Independently reviewed.  Biphasic P wave, slight ST segment downsloping depression in V6, no corresponding ST segment depression in any other contiguous lead.  Sinus tachycardia.   Assessment and Plan: CAP (community acquired pneumonia) Patient has poor dentition, presents with right subcostal severe pain with inspiration.  As well as coughing and productive sputum.  Chest x-ray showing right lower lobe infiltration.  Concern for pneumonia, possible aspiration pneumonia given patient history of polysubstance abuse.  Patient is s/p ceftriaxone and doxycycline.  I will check blood cultures, treat with Unasyn plus doxycycline.  Concern is if patient may have had pulmonary embolism given his typical pleuritic pain.  No fever no white count.  However patient needs apixaban already for another indication of his chronic strokes, I do not  think patient would benefit from a CAT scan with contrast given his chronic kidney disease.  I will do a lower extremity Doppler and suffice it at that.  Troponin I above reference range This is likely due to patient's pneumonia as well as chronic kidney disease.  Some of it is also chronic based on review of prior lab results.  We will trend this and continue to monitor clinically.  I do not think this is concerning for acute coronary syndrome given all the overall patient presentation.  CVA (cerebral vascular accident) [facility]) Patient has previously documented several chronic strokes, patient has been having garbled speech for the last 2 3 days, that is concerning for recrudescence of patient's prior cerebral infarction symptoms.  However CAT scan is showing acute left frontoparietal infarction this has been discussed with the neurology service by the ER attending.  At this time plan would be to proceed with doing an MRI to further try to determine the acuity of this infarction.  Further workup will be done accordingly.  I will order a swallow screen in the interim.  Patient is supposed to be on chronic apixaban for his prior embolic strokes.  However has been poorly catheter into this regimen.  Once the acute MRI is negative for acute large infarction, I plan to restart patient's apixaban.   Patient medication reconciliation done, resuming atorvastatin, Jardiance, clopidogrel I will.  Patient received aspirin in the ER.  I have held off on patient blood pressure medications given that his blood pressure is currently acceptable.  Further I have held off on patient's apixaban pending MRI study.   Advance Care Planning:   Code Status: Prior full code , discussed with patient and wife at bedside.  Consults: neurology engaged by ER provider. Dr. Iver Nestle.  Family Communication: discussed with wife in detail. Allquestions answered.  Severity of Illness: The appropriate patient status for this patient is  INPATIENT. Inpatient status is judged to  be reasonable and necessary in order to provide the required intensity of service to ensure the patient's safety. The patient's presenting symptoms, physical exam findings, and initial radiographic and laboratory data in the context of their chronic comorbidities is felt to place them at high risk for further clinical deterioration. Furthermore, it is not anticipated that the patient will be medically stable for discharge from the hospital within 2 midnights of admission.   * I certify that at the point of admission it is my clinical judgment that the patient will require inpatient hospital care spanning beyond 2 midnights from the point of admission due to high intensity of service, high risk for further deterioration and high frequency of surveillance required.*  Author: Nolberto Hanlon, MD 05/25/2023 6:03 PM  For on call review [email or web address].

## 2023-05-25 NOTE — ED Notes (Signed)
ED TO INPATIENT HANDOFF REPORT  ED Nurse Name and Phone #: Victorino Dike [phone]  S Name/Age/Gender Craig Schmidt 52 y.o. male Room/Bed: 043C/043C  Code Status   Code Status: Full Code  Home/SNF/Other Home Patient oriented to: self, place, time, and situation Is this baseline? Yes   Triage Complete: Triage complete  Chief Complaint Pneumonia [J18.9]  Triage Note PT was seen by EMS and t refused to go with EMS. EMS was called out again for chest pain and SOB. Pain is worse with breathing and exertion. Pt A&OX4   Allergies Allergies  Allergen Reactions   Shellfish Allergy Anaphylaxis   Peanut-Containing Drug Products Itching    Level of Care/Admitting Diagnosis ED Disposition     ED Disposition  Admit   Condition  --   Comment  Hospital Area: [facility] [[id number]]  Level of Care: Telemetry Medical [104]  May admit patient to [facility] or [facility] if equivalent level of care is available:: No  Covid Evaluation: Asymptomatic - no recent exposure (last 10 days) testing not required  Diagnosis: Pneumonia [227785]  Admitting Physician: Nolberto Hanlon [[id number]]  Attending Physician: Nolberto Hanlon [[id number]]  Certification:: I certify this patient will need inpatient services for at least 2 midnights  Expected Medical Readiness: 05/27/2023          B Medical/Surgery History Past Medical History:  Diagnosis Date   Alcohol abuse    Asthma    Brain tumor (HCC)    CAD (coronary artery disease)    Chronic HFrEF (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)    Chronic kidney disease, stage 3 (HCC)    Cocaine abuse (HCC)    History of medication noncompliance    Homelessness    MI (myocardial infarction) (HCC)    STEMI (ST elevation myocardial infarction) (HCC)    Stroke [facility])    Past Surgical History:  Procedure Laterality Date   BUBBLE STUDY  09/11/2021   Procedure: BUBBLE STUDY;  Surgeon: Pricilla Riffle, MD;  Location: [facility] ENDOSCOPY;   Service: Cardiovascular;;   LEFT HEART CATH AND CORONARY ANGIOGRAPHY N/A 12/19/2021   Procedure: LEFT HEART CATH AND CORONARY ANGIOGRAPHY;  Surgeon: Lyn Records, MD;  Location: [facility] INVASIVE CV LAB;  Service: Cardiovascular;  Laterality: N/A;   RIGHT/LEFT HEART CATH AND CORONARY ANGIOGRAPHY N/A 04/13/2022   Procedure: RIGHT/LEFT HEART CATH AND CORONARY ANGIOGRAPHY;  Surgeon: Laurey Morale, MD;  Location: [facility] INVASIVE CV LAB;  Service: Cardiovascular;  Laterality: N/A;   TEE WITHOUT CARDIOVERSION N/A 09/11/2021   Procedure: TRANSESOPHAGEAL ECHOCARDIOGRAM (TEE);  Surgeon: Pricilla Riffle, MD;  Location: [facility] ENDOSCOPY;  Service: Cardiovascular;  Laterality: N/A;     A IV Location/Drains/Wounds Patient Lines/Drains/Airways Status     Active Line/Drains/Airways     Name Placement date Placement time Site Days   Peripheral IV 05/25/23 20 G Left Antecubital 05/25/23  1500  Antecubital  less than 1            Intake/Output Last 24 hours  Intake/Output Summary (Last 24 hours) at 05/25/2023 1825 Last data filed at 05/25/2023 1627 Gross per 24 hour  Intake 100 ml  Output --  Net 100 ml    Labs/Imaging Results for orders placed or performed during the hospital encounter of 05/25/23 (from the past 48 hour(s))  Troponin I (High Sensitivity)     Status: Abnormal   Collection Time: 05/25/23  1:39 PM  Result Value Ref Range   Troponin I (High Sensitivity) 74 (H) <18  ng/L    Comment: (NOTE) Elevated high sensitivity troponin I (hsTnI) values and significant  changes across serial measurements may suggest ACS but many other  chronic and acute conditions are known to elevate hsTnI results.  Refer to the "Links" section for chest pain algorithms and additional  guidance. Performed at [facility], [address], [place], [place] [address]   Basic metabolic panel     Status: Abnormal   Collection Time: 05/25/23  1:39 PM  Result Value Ref Range   Sodium 135 135 - 145 mmol/L    Potassium 4.4 3.5 - 5.1 mmol/L   Chloride 99 98 - 111 mmol/L   CO2 22 22 - 32 mmol/L   Glucose, Bld 111 (H) 70 - 99 mg/dL    Comment: Glucose reference range applies only to samples taken after fasting for at least 8 hours.   BUN 22 (H) 6 - 20 mg/dL   Creatinine, Ser 0.35 (H) 0.61 - 1.24 mg/dL   Calcium 9.3 8.9 - 00.9 mg/dL   GFR, Estimated 57 (L) >60 mL/min    Comment: (NOTE) Calculated using the CKD-EPI Creatinine Equation (2021)    Anion gap 14 5 - 15    Comment: Performed at [facility], [address], [place], [place] [address]  CBC with Differential     Status: Abnormal   Collection Time: 05/25/23  1:39 PM  Result Value Ref Range   WBC 10.3 4.0 - 10.5 K/uL   RBC 3.92 (L) 4.22 - 5.81 MIL/uL   Hemoglobin 12.0 (L) 13.0 - 17.0 g/dL   HCT 99.3 (L) 71.6 - 96.7 %   MCV 95.2 80.0 - 100.0 fL   MCH 30.6 26.0 - 34.0 pg   MCHC 32.2 30.0 - 36.0 g/dL   RDW 89.3 81.0 - 17.5 %   Platelets 238 150 - 400 K/uL   nRBC 0.0 0.0 - 0.2 %   Neutrophils Relative % 86 %   Neutro Abs 8.7 (H) 1.7 - 7.7 K/uL   Lymphocytes Relative 8 %   Lymphs Abs 0.9 0.7 - 4.0 K/uL   Monocytes Relative 6 %   Monocytes Absolute 0.6 0.1 - 1.0 K/uL   Eosinophils Relative 0 %   Eosinophils Absolute 0.0 0.0 - 0.5 K/uL   Basophils Relative 0 %   Basophils Absolute 0.0 0.0 - 0.1 K/uL   Immature Granulocytes 0 %   Abs Immature Granulocytes 0.03 0.00 - 0.07 K/uL    Comment: Performed at [facility], [address] [place], [place] [address]  Brain natriuretic peptide     Status: Abnormal   Collection Time: 05/25/23  2:39 PM  Result Value Ref Range   B Natriuretic Peptide 1,346.6 (H) 0.0 - 100.0 pg/mL    Comment: Performed at [facility], [address], [place], [place] [address]  Troponin I (High Sensitivity)     Status: Abnormal   Collection Time: 05/25/23  5:20 PM  Result Value Ref Range   Troponin I (High Sensitivity) 59 (H) <18 ng/L    Comment: (NOTE) Elevated high sensitivity troponin I  (hsTnI) values and significant  changes across serial measurements may suggest ACS but many other  chronic and acute conditions are known to elevate hsTnI results.  Refer to the "Links" section for chest pain algorithms and additional  guidance. Performed at [facility], [address], [place], [place] [address]    CT Head Wo Contrast  Result Date: 05/25/2023 CLINICAL  DATA:  Neuro deficit, acute, stroke suspected Mental status change, unknown cause right arm paresthesia EXAM: CT HEAD WITHOUT CONTRAST TECHNIQUE: Contiguous axial images were obtained from the base of the skull through the vertex without intravenous contrast. RADIATION DOSE REDUCTION: This exam was performed according to the departmental dose-optimization program which includes automated exposure control, adjustment of the mA and/or kV according to patient size and/or use of iterative reconstruction technique. COMPARISON:  CT head 10/03/2021 FINDINGS: Brain: Cerebral ventricle sizes are concordant with the degree of cerebral volume loss. Interval development of loss of left frontal gray-white matter differentiation along the precentral gyrus and anteriorly suggestive of acute infarction. Finding extends minimally to the postcentral gyrus. No parenchymal hemorrhage. No mass lesion. No extra-axial collection. No mass effect or midline shift. No hydrocephalus. Basilar cisterns are patent. Mega cisterna magna. Vascular: No hyperdense vessel. Skull: No acute fracture or focal lesion. Sinuses/Orbits: Paranasal sinuses and mastoid air cells are clear. The orbits are unremarkable. Other: None. IMPRESSION: 1. Acute left frontoparietal infarction. Recommend MRI noncontrast for further evaluation 2. No acute intracranial hemorrhage. These results were called by telephone at the time of interpretation on 05/25/2023 at 3:58 pm to provider Rexford Maus, DO, who verbally acknowledged these results. Electronically Signed   By: Tish Frederickson  M.D.   On: 05/25/2023 15:58   DG Chest 2 View  Result Date: 05/25/2023 CLINICAL DATA:  Chest pain EXAM: CHEST - 2 VIEW COMPARISON:  Chest x-ray dated Dec 02, 2022 FINDINGS: Unchanged cardiomegaly. New consolidation of the anterior right lower lobe. Left lower lobe consolidation, also seen on prior exam. New mild diffuse interstitial opacities. No evidence of pneumothorax. No pleural effusion. IMPRESSION: 1. New consolidation of the anterior right lower lobe, concerning for infection or aspiration. 2. Left lower lobe consolidation. Further evaluation with chest CT recommended given lack of resolution when compared with the prior. 3. New mild diffuse interstitial opacities which could be due to a component of pulmonary edema. 4. Cardiomegaly. Electronically Signed   By: Allegra Lai M.D.   On: 05/25/2023 15:18    Pending Labs Unresulted Labs (From admission, onward)     Start     Ordered   06/01/23 0500  Creatinine, serum  (enoxaparin (LOVENOX)    CrCl >/= 30 ml/min)  Weekly,   R     Comments: while on enoxaparin therapy    05/25/23 1820   05/26/23 0500  APTT  Tomorrow morning,   R        05/25/23 1820   05/26/23 0500  Protime-INR  Tomorrow morning,   R        05/25/23 1820   05/26/23 0500  Basic metabolic panel  Tomorrow morning,   R        05/25/23 1820   05/26/23 0500  CBC  Tomorrow morning,   R        05/25/23 1820   05/26/23 0500  Lipid panel  (Labs)  Tomorrow morning,   R       Comments: Fasting    05/25/23 1823   05/25/23 1820  SARS Coronavirus 2 by RT PCR (hospital order, performed in [facility] hospital lab) *cepheid single result test* Anterior Nasal Swab  (Tier 2 - SARS Coronavirus 2 by RT PCR (hospital order, performed in [facility] hospital lab) *cepheid single result test*)  Once,   R        05/25/23 1820   05/25/23 1820  Respiratory (~20 pathogens) panel by PCR  (Respiratory panel  by PCR (~20 pathogens, ~24 hr TAT)  w precautions)  Once,   R        05/25/23 1820    05/25/23 1819  CBC  (enoxaparin (LOVENOX)    CrCl >/= 30 ml/min)  Once,   R       Comments: Baseline for enoxaparin therapy IF NOT ALREADY DRAWN.  Notify MD if PLT < 100 K.    05/25/23 1820   05/25/23 1819  Creatinine, serum  (enoxaparin (LOVENOX)    CrCl >/= 30 ml/min)  Once,   R       Comments: Baseline for enoxaparin therapy IF NOT ALREADY DRAWN.    05/25/23 1820   05/25/23 1811  Culture, blood (Routine X 2) w Reflex to ID Panel  BLOOD CULTURE X 2,   R (with STAT occurrences)      05/25/23 1810   05/25/23 1437  Rapid urine drug screen (hospital performed)  ONCE - STAT,   STAT        05/25/23 1436            Vitals/Pain Today's Vitals   05/25/23 1447 05/25/23 1600 05/25/23 1744 05/25/23 1811  BP: 119/85 110/82 128/88 135/89  Pulse: 100 (!) 110 97 (!) 106  Resp: (!) 24 (!) 22 20 19   Temp:    98.1 F (36.7 C)  TempSrc:    Oral  SpO2: 99% 98% 99% 96%  Weight:      Height:      PainSc:        Isolation Precautions Droplet precaution  Medications Medications  nitroGLYCERIN (NITROSTAT) SL tablet 0.4 mg (0.4 mg Sublingual Given 05/25/23 1507)  doxycycline (VIBRAMYCIN) 100 mg in dextrose 5 % 250 mL IVPB (100 mg Intravenous New Bag/Given 05/25/23 1626)  doxycycline (VIBRAMYCIN) 100 mg in dextrose 5 % 250 mL IVPB (has no administration in time range)  empagliflozin (JARDIANCE) tablet 10 mg (has no administration in time range)  atorvastatin (LIPITOR) tablet 40 mg (has no administration in time range)  clopidogrel (PLAVIX) tablet 75 mg (has no administration in time range)  enoxaparin (LOVENOX) injection 40 mg (has no administration in time range)  acetaminophen (TYLENOL) tablet 650 mg (has no administration in time range)    Or  acetaminophen (TYLENOL) suppository 650 mg (has no administration in time range)  polyethylene glycol (MIRALAX / GLYCOLAX) packet 17 g (has no administration in time range)  sodium chloride flush (NS) 0.9 % injection 3 mL (has no administration in time  range)  dextrose 5 %-0.9 % sodium chloride infusion (has no administration in time range)   stroke: early stages of recovery book (has no administration in time range)  aspirin chewable tablet 324 mg (324 mg Oral Given 05/25/23 1507)  cefTRIAXone (ROCEPHIN) 1 g in sodium chloride 0.9 % 100 mL IVPB (0 g Intravenous Stopped 05/25/23 1627)    Mobility non-ambulatory     Focused Assessments Neuro Assessment Handoff:  Swallow screen pass?  Not yet performed   NIH Stroke Scale  Dizziness Present: No Headache Present: No Interval: Initial Level of Consciousness (1a.)   : Alert, keenly responsive LOC Questions (1b. )   : Answers both questions correctly LOC Commands (1c. )   : Performs both tasks correctly Best Gaze (2. )  : Normal Visual (3. )  : No visual loss Facial Palsy (4. )    : Normal symmetrical movements Motor Arm, Left (5a. )   : No effort against gravity Motor Arm, Right (5b. ) :  Drift Motor Leg, Left (6a. )  : No effort against gravity Motor Leg, Right (6b. ) : Drift Limb Ataxia (7. ): Absent Sensory (8. )  : Mild-to-moderate sensory loss, patient feels pinprick is less sharp or is dull on the affected side, or there is a loss of superficial pain with pinprick, but patient is aware of being touched Best Language (9. )  : No aphasia Dysarthria (10. ): Normal Extinction/Inattention (11.)   : No Abnormality Complete NIHSS TOTAL: 9     Neuro Assessment:   Neuro Checks:   Initial (05/25/23 1427)  Has TPA been given? No If patient is a Neuro Trauma and patient is going to OR before floor call report to 4N Charge nurse: [phone] or [phone]   R Recommendations: See Admitting Provider Note  Report given to:   Additional Notes: In MRI

## 2023-05-25 NOTE — Assessment & Plan Note (Signed)
This is likely due to patient's pneumonia as well as chronic kidney disease.  Some of it is also chronic based on review of prior lab results.  We will trend this and continue to monitor clinically.  I do not think this is concerning for acute coronary syndrome given all the overall patient presentation.

## 2023-05-25 NOTE — Progress Notes (Signed)
Pharmacy Antibiotic Note  NEHEMYAH FOUSHEE is a 52 y.o. male admitted on 05/25/2023 presenting with coughing, concern for aspiration.  Pharmacy has been consulted for Unasyn dosing.  Plan: Unasyn 3g IV q 8 hours Monitor renal function, clinical progression and LOT  Height: 5\' 7"  (170.2 cm) Weight: 68.9 kg (151 lb 14.4 oz) IBW/kg (Calculated) : 66.1  Temp (24hrs), Avg:98 F (36.7 C), Min:97.9 F (36.6 C), Max:98.1 F (36.7 C)  Recent Labs  Lab 05/25/23 1339  WBC 10.3  CREATININE 1.48*    Estimated Creatinine Clearance: 55.8 mL/min (A) (by C-G formula based on SCr of 1.48 mg/dL (H)).    Allergies  Allergen Reactions   Shellfish Allergy Anaphylaxis   Peanut-Containing Drug Products Itching    Daylene Posey, PharmD, [facility] Clinical Pharmacist ED Pharmacist Phone # [phone] 05/25/2023 6:26 PM

## 2023-05-25 NOTE — Assessment & Plan Note (Signed)
Patient has poor dentition, presents with right subcostal severe pain with inspiration.  As well as coughing and productive sputum.  Chest x-ray showing right lower lobe infiltration.  Concern for pneumonia, possible aspiration pneumonia given patient history of polysubstance abuse.  Patient is s/p ceftriaxone and doxycycline.  I will check blood cultures, treat with Unasyn plus doxycycline.  Concern is if patient may have had pulmonary embolism given his typical pleuritic pain.  No fever no white count.  However patient needs apixaban already for another indication of his chronic strokes, I do not think patient would benefit from a CAT scan with contrast given his chronic kidney disease.  I will do a lower extremity Doppler and suffice it at that.

## 2023-05-25 NOTE — Progress Notes (Addendum)
Pt schedued for MRI. MRI contacted for pt. Requesting if pt had additional meds for MRI. Questioned pt.regarding MRI. Valium ordered x 1. Pt refused to go for MRI at this time. Wife at bedside and agreed no MRI at this time. Wait til he wants to go. Valium pushed back at this time until patient willing for MRI.

## 2023-05-26 ENCOUNTER — Inpatient Hospital Stay (HOSPITAL_COMMUNITY): Payer: Medicaid Other

## 2023-05-26 ENCOUNTER — Encounter (HOSPITAL_COMMUNITY): Payer: Medicaid Other

## 2023-05-26 DIAGNOSIS — J189 Pneumonia, unspecified organism: Secondary | ICD-10-CM | POA: Diagnosis not present

## 2023-05-26 LAB — BASIC METABOLIC PANEL
Anion gap: 12 (ref 5–15)
BUN: 18 mg/dL (ref 6–20)
CO2: 20 mmol/L — ABNORMAL LOW (ref 22–32)
Calcium: 8.6 mg/dL — ABNORMAL LOW (ref 8.9–10.3)
Chloride: 102 mmol/L (ref 98–111)
Creatinine, Ser: 1.32 mg/dL — ABNORMAL HIGH (ref 0.61–1.24)
GFR, Estimated: >60 mL/min (ref >60)
Glucose, Bld: 128 mg/dL — ABNORMAL HIGH (ref 70–99)
Potassium: 3.8 mmol/L (ref 3.5–5.1)
Sodium: 134 mmol/L — ABNORMAL LOW (ref 135–145)

## 2023-05-26 LAB — LIPID PANEL
Cholesterol: 81 mg/dL (ref 0–200)
HDL: 34 mg/dL — ABNORMAL LOW (ref >40)
LDL Cholesterol: 42 mg/dL (ref 0–99)
Total CHOL/HDL Ratio: 2.4 {ratio}
Triglycerides: 27 mg/dL (ref <150)
VLDL: 5 mg/dL (ref 0–40)

## 2023-05-26 LAB — CBC
HCT: 31 % — ABNORMAL LOW (ref 39.0–52.0)
Hemoglobin: 10 g/dL — ABNORMAL LOW (ref 13.0–17.0)
MCH: 30 pg (ref 26.0–34.0)
MCHC: 32.3 g/dL (ref 30.0–36.0)
MCV: 93.1 fL (ref 80.0–100.0)
Platelets: 206 10*3/uL (ref 150–400)
RBC: 3.33 MIL/uL — ABNORMAL LOW (ref 4.22–5.81)
RDW: 14.3 % (ref 11.5–15.5)
WBC: 10.4 10*3/uL (ref 4.0–10.5)
nRBC: 0 % (ref 0.0–0.2)

## 2023-05-26 LAB — APTT: aPTT: 33 s (ref 24–36)

## 2023-05-26 LAB — PROTIME-INR
INR: 1.3 — ABNORMAL HIGH (ref 0.8–1.2)
Prothrombin Time: 16.3 s — ABNORMAL HIGH (ref 11.4–15.2)

## 2023-05-26 LAB — RAPID URINE DRUG SCREEN, HOSP PERFORMED
Amphetamines: NOT DETECTED
Barbiturates: NOT DETECTED
Benzodiazepines: NOT DETECTED
Cocaine: POSITIVE — AB
Opiates: NOT DETECTED
Tetrahydrocannabinol: POSITIVE — AB

## 2023-05-26 LAB — MAGNESIUM: Magnesium: 1.8 mg/dL (ref 1.7–2.4)

## 2023-05-26 LAB — PROCALCITONIN: Procalcitonin: 0.35 ng/mL

## 2023-05-26 MED ORDER — ASPIRIN 81 MG PO TBEC
81.0000 mg | DELAYED_RELEASE_TABLET | Freq: Every day | ORAL | Status: DC
  Administered 2023-05-26 – 2023-05-31 (×6): 81 mg via ORAL
  Filled 2023-05-26 (×6): qty 1

## 2023-05-26 MED ORDER — GADOBUTROL 1 MMOL/ML IV SOLN
7.0000 mL | Freq: Once | INTRAVENOUS | Status: AC | PRN
Start: 2023-05-26 — End: 2023-05-26
  Administered 2023-05-26: 7 mL via INTRAVENOUS

## 2023-05-26 MED ORDER — ASPIRIN 325 MG PO TABS: 325.0000 mg | ORAL_TABLET | Freq: Every day | ORAL | Status: DC

## 2023-05-26 MED ORDER — IPRATROPIUM BROMIDE 0.02 % IN SOLN
0.5000 mg | RESPIRATORY_TRACT | Status: DC | PRN
  Administered 2023-05-26: 0.5 mg via RESPIRATORY_TRACT
  Filled 2023-05-26: qty 2.5

## 2023-05-26 NOTE — Progress Notes (Signed)
Pt still noncompliant with proceeding with MRI. Will follow up to see if pt will be compliant. Will push valium back on MAR.

## 2023-05-26 NOTE — Progress Notes (Signed)
Spoke with patient and wife regarding MRI order. Pt informed RN that he couldn't go now but would go early am. Pt medicated with oxy for back pain. Wife at bedside and verbalized what patient stated. Informed pt and wife would recheck later to see if agreeable to go to MRI. Then I will contact MRI and administer Valium prior to going.

## 2023-05-26 NOTE — Progress Notes (Signed)
OT Cancellation Note  Patient Details Name: KINNETH FUJIWARA MRN: [id number] DOB: 1971-01-29   Cancelled Treatment:    Reason Eval/Treat Not Completed: Other (comment) (per MD via securechat, hold therapy until ultrasound to rule out DVT. Will follow up for OT evaluation as schedule permits)  Carver Fila, OTD, OTR/L SecureChat Preferred Acute Rehab [phone]   Dalphine Handing 05/26/2023, 12:41 PM

## 2023-05-26 NOTE — Progress Notes (Signed)
Dr. Joneen Roach aware of pt continued noncompliance with MRI.

## 2023-05-26 NOTE — Progress Notes (Signed)
PROGRESS NOTE  Arnie Clingenpeel Macaluso  DOB: 05-05-1971  PCP: Rema Fendt, NP BJY:[id number]  DOA: 05/25/2023  LOS: 1 day  Hospital Day: 2  Brief narrative: Craig Schmidt is a 52 y.o. 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 Patient has mild chronic right upper extremity weakness from prior strokes.  He noticed worsening of that weakness in the last 2 to 3 days associated with numbness and tingling.  Also had occasional garbled speech.    In the ED, patient was afebrile, tachycardic to 100s, blood pressure in 120s, breathing on room air Labs showed WC count normal 10.4, hemoglobin 10, BUN/creatinine 18/1.32 Urine drug screen positive for cocaine and THC Blood culture sent Chest x-ray showed new anterior lower lobe consolidation, left lower lobe consolidation, new mild diffuse interstitial opacities CT head showed acute left frontoparietal infarction.  Admitted to [facility] for further workup  Subjective: Patient was seen and examined this morning. Middle-aged African-American male.  He was moaning in pain with severe low back pain.  His significant other was at bedside.  Both of them are homeless.  Patient states he has been homeless for last 30 years.  Patient admits alcohol and cocaine use but denies any IV drug abuse.  Assessment and plan: Acute stroke H/o prior several strokes On admission, patient complained of worsening weakness, garbled speech CT head as above showed acute left frontoparietal infarction MRI brain pending. Prior to admission, supposed to be on Eliquis but noncompliant. Started stroke workup Given aspirin and statin in the ED.  Severe low back pain Patient has significant lower back pain and tenderness. Obtain  MRI lumbar spine.  Multifocal pneumonia Patient with dyspnea, chest pain, productive cough  Chest x-ray showed new anterior lower lobe consolidation, left lower lobe consolidation, new mild diffuse interstitial opacities No fever, WBC count not elevated.  Procalcitonin level elevated to 0.35. Blood culture sent. Started on Unasyn and doxycycline Recent Labs  Lab 05/25/23 1339 05/25/23 2032 05/26/23 0454 05/26/23 0455  WBC 10.3 10.3  --  10.4  PROCALCITON  --   --  0.35  --    Elevated troponin CAD/ h/o STEMI Mildly elevated, downtrending.   Has significant CAD risk factors and history of MI is likely due to patient's pneumonia as well as chronic kidney disease.   PTA meds- noncompliant to Eliquis, Plavix, statin Recent Labs    05/25/23 1339 05/25/23 1720  TROPONINIHS 74* 59*   Cardiomyopathy Chronic systolic CHF PTA meds-none compliant to Coreg, Jardiance, metolazone, Imdur, Lasix Does not seem to be volume overloaded at this time. Blood pressure is stable as well.  Continue to monitor  CKD 2 Creatinine at baseline. Recent Labs    11/28/22 1155 11/30/22 0931 12/01/22 0053 12/02/22 0033 12/03/22 0039 12/04/22 0047 01/23/23 1522 03/07/23 1202 05/25/23 1339 05/25/23 2032 05/26/23 0455  BUN 17 21* 26* 29* 32* 36* 22* 20 22*  --  18  CREATININE 1.69* 1.77* 1.93* 1.95* 2.16* 2.02* 1.70* 1.80* 1.48* 1.53* 1.32*  CO2 18* 32 28 28 25 23  21* 22 22  --  20*      Mobility: needs PT eval  Goals of care   Code Status: Full Code     DVT prophylaxis:  enoxaparin (LOVENOX) injection 40 mg Start: 05/26/23 0800 SCDs Start: 05/25/23 1820   Antimicrobials:  Unasyn and doxycycline Fluid: D5 NS at 100 mL/h Consultants: Neurology Family Communication: Significant other at bedside  Status: Inpatient Level of care:  Telemetry Medical   Patient is from: Homeless Needs to continue in-hospital care: Stroke workup, workup for severe low back pain Anticipated d/c to:  Pending clinical course      Diet:  Diet Order             Diet Heart Room service appropriate? Yes; Fluid consistency: Thin  Diet effective now                   Scheduled Meds:  aspirin EC  81 mg Oral Daily   atorvastatin  40 mg Oral Daily   clopidogrel  75 mg Oral Daily   diazepam  5 mg Intravenous Once   empagliflozin  10 mg Oral Daily   enoxaparin (LOVENOX) injection  40 mg Subcutaneous Q24H   sodium chloride flush  3 mL Intravenous Q12H    PRN meds: acetaminophen **OR** acetaminophen, ipratropium, nitroGLYCERIN, oxyCODONE, polyethylene glycol   Infusions:   ampicillin-sulbactam (UNASYN) IV 3 g (05/26/23 1115)   dextrose 5 % and 0.9 % NaCl 100 mL/hr at 05/26/23 0612   doxycycline (VIBRAMYCIN) IV 100 mg (05/26/23 0848)    Antimicrobials: Anti-infectives (From admission, onward)    Start     Dose/Rate Route Frequency Ordered Stop   05/26/23 0800  doxycycline (VIBRAMYCIN) 100 mg in dextrose 5 % 250 mL IVPB        100 mg 125 mL/hr over 120 Minutes Intravenous Every 12 hours 05/25/23 1815 05/29/23 0759   05/25/23 1830  Ampicillin-Sulbactam (UNASYN) 3 g in sodium chloride 0.9 % 100 mL IVPB        3 g 200 mL/hr over 30 Minutes Intravenous Every 8 hours 05/25/23 1827     05/25/23 1530  cefTRIAXone (ROCEPHIN) 1 g in sodium chloride 0.9 % 100 mL IVPB        1 g 200 mL/hr over 30 Minutes Intravenous  Once 05/25/23 1523 05/25/23 1627   05/25/23 1530  doxycycline (VIBRAMYCIN) 100 mg in dextrose 5 % 250 mL IVPB        100 mg 125 mL/hr over 120 Minutes Intravenous  Once 05/25/23 1523 05/25/23 1918       Objective: Vitals:   05/26/23 0720 05/26/23 1124  BP: (!) 135/104 122/64  Pulse: 100 98  Resp: (!) 22 (!) 21  Temp: 97.8 F (36.6 C) 97.7 F (36.5 C)  SpO2: 95% 94%    Intake/Output Summary (Last 24 hours) at 05/26/2023 1139 Last data filed at 05/26/2023 0700 Gross per 24 hour  Intake 1762.19 ml  Output 600 ml  Net 1162.19 ml   Filed Weights   05/25/23  1328  Weight: 68.9 kg   Weight change:  Body mass index is 23.79 kg/m.   Physical Exam: General exam: Middle-aged African-American male Skin: No rashes, lesions or ulcers. HEENT: Atraumatic, normocephalic, no obvious bleeding Lungs: Diminished in both bases, coughs on deep breathing CVS: Regular rate and rhythm, no murmur GI/Abd soft, nontender, nondistended, bowels are present Back: Severe low back pain and vague area of spinal and paraspinal tenderness CNS: Alert, awake, oriented x 3 able to answer questions Psychiatry: In pain Extremities: No pedal edema, no calf tenderness  Data Review: I have personally reviewed the laboratory data and studies available.  F/u labs ordered Unresulted Labs (From admission, onward)     Start     Ordered   06/01/23 0500  Creatinine, serum  (enoxaparin (LOVENOX)    CrCl >/= 30 ml/min)  Weekly,   R     Comments: while on enoxaparin therapy    05/25/23 1820   05/27/23 0500  Lipid panel  Tomorrow morning,   R        05/26/23 1126   05/27/23 0500  Hemoglobin A1c  Tomorrow morning,   R        05/26/23 1126   05/25/23 1820  Respiratory (~20 pathogens) panel by PCR  (Respiratory panel by PCR (~20 pathogens, ~24 hr TAT)  w precautions)  Once,   R        05/25/23 1820   05/25/23 1811  Culture, blood (Routine X 2) w Reflex to ID Panel  BLOOD CULTURE X 2,   R      05/25/23 1810            Total time spent in review of labs and imaging, patient evaluation, formulation of plan, documentation and communication with family: 55 minutes  Signed, Lorin Glass, MD [facility] 05/26/2023

## 2023-05-26 NOTE — Plan of Care (Signed)
MRI brain personally reviewed and radiology report reviewed:  1. Two possible tiny acute cortical infarcts in the bilateral frontal lobes.  2. No other evidence of acute intracranial pathology. The left frontoparietal infarct described on the CT head from 1 day prior is remote.  3. Numerous additional remote infarcts and age accelerated parenchymal volume loss as above.  4. Unchanged left vestibular schwannoma.   Discussed with primary team.  I favor item 1 under the radiologist impression to be an artifactual finding --certainly even if these are acute infarcts they are small enough that the risk of restarting anticoagulation is outweighed by the benefit.  From review of prior neurology notes it appears he was last recommended for warfarin due to concern for nonadherence and possibly cost rather than Eliquis.  Either agent is fine from my perspective, which ever would be best with regards to his current social situation.  There does not appear to be any acute neurological process based on my brief chart review but certainly neurology is available for full consultation if that is felt to be needed by primary team  Discussed with Dr. Pola Corn via secure chat.   Brooke Dare MD-PhD [facility] [phone]  Available 7 AM to 7 PM, outside these hours please contact Neurologist on call listed on AMION    These are curbside recommendations based upon the information readily available in the chart on brief review as well as history and examination information provided to me by requesting provider (initially provided by EDP on admission) and do not replace a full detailed consult

## 2023-05-26 NOTE — Progress Notes (Signed)
Dr. Joneen Roach made aware. Asymptomatic. Last K 4.4. No mag on file this admission. Dr. Joneen Roach ordered mag for labs   05/26/23 0405  ECG Monitoring  CV Strip Heart Rate 99  Cardiac Rhythm NSR  Ectopy Multifocal PVC's;Ventricular tachycardia, non-sustained (4 bts Comptroller)

## 2023-05-26 NOTE — Progress Notes (Signed)
Pt noted to be wheezing throughout. Tachyapneic at rest and increase more with movement. Provider Cros[place] contacted. Nebs ordered and given. Decrease in wheezing noted. Respirations remain 20-24. SIRS Mews flagged provider notified. Pt on unasyn every 8 hours.   05/26/23 0301  Assess: MEWS Score  Temp 100.2 F (37.9 C)  BP (!) 141/100  MAP (mmHg) 110  Pulse Rate (!) 108  Resp (!) 22  Assess: MEWS Score  MEWS Temp 0  MEWS Systolic 0  MEWS Pulse 1  MEWS RR 1  MEWS LOC 0  MEWS Score 2  MEWS Score Color Yellow  Assess: if the MEWS score is Yellow or Red  Were vital signs accurate and taken at a resting state? Yes  Does the patient meet 2 or more of the SIRS criteria? Yes  Does the patient have a confirmed or suspected source of infection? Yes  MEWS guidelines implemented  Yes, yellow  Treat  MEWS Interventions Considered administering scheduled or prn medications/treatments as ordered  Take Vital Signs  Increase Vital Sign Frequency  Yellow: Q2hr x1, continue Q4hrs until patient remains green for 12hrs  Escalate  MEWS: Escalate Yellow: Discuss with charge nurse and consider notifying provider and/or RRT  Notify: Charge Nurse/RN  Name of Charge Nurse/RN Notified [place]  Provider Notification  Provider Name/Title Dr. Joneen Roach  Date Provider Notified 05/26/23  Time Provider Notified 0300 (notified of wheezing. Distress. Neb ordered. Vitals. Again notified that news)  Notification Reason Change in status  Provider response See new orders  Date of Provider Response 05/26/23  Time of Provider Response 0305  Assess: SIRS CRITERIA  SIRS Temperature  0  SIRS Pulse 1  SIRS Respirations  1  SIRS WBC 0  SIRS Score Sum  2

## 2023-05-26 NOTE — Plan of Care (Signed)

## 2023-05-26 NOTE — Progress Notes (Signed)
PT Cancellation Note  Patient Details Name: Craig Schmidt MRN: [id number] DOB: 1970/12/17   Cancelled Treatment:    Reason Eval/Treat Not Completed: (P) Other (comment) Vascular US to rule out DVT has not been completed. PT will follow back for Evaluation tomorrow.   Williemae Muriel B. Beverely Risen PT, DPT Acute Rehabilitation Services Please use secure chat or  Call Office [phone]    Elon Alas [facility] 05/26/2023, 3:55 PM

## 2023-05-26 NOTE — Progress Notes (Signed)
Transported to MRI after pre medicated with Valium 5 mg IV.  Wife at bedside.  No signs of distress.

## 2023-05-26 NOTE — Plan of Care (Signed)
Pt alert and oriented x 4. Noncompliant with care. MRI not completed at this time due to pt refusal to go. Pt had episodes of shortness of breath and wheezing. MD contacted and neb ordered and given. It was effects at decreasing shortness of breath and wheezing. Flagged mews this am. Dr. Joneen Roach aware see notes. 4 beats of vtach noted during shift. Asymptomatic. Mag added to labs this am. This am 0500 vitals green and improved.  Problem: Education: Goal: Knowledge of disease or condition will improve Outcome: Progressing Goal: Knowledge of secondary prevention will improve (MUST DOCUMENT ALL) Outcome: Progressing Goal: Knowledge of patient specific risk factors will improve Loraine Leriche N/A or DELETE if not current risk factor) Outcome: Progressing   Problem: Ischemic Stroke/TIA Tissue Perfusion: Goal: Complications of ischemic stroke/TIA will be minimized Outcome: Progressing   Problem: Coping: Goal: Will verbalize positive feelings about self Outcome: Progressing Goal: Will identify appropriate support needs Outcome: Progressing   Problem: Health Behavior/Discharge Planning: Goal: Ability to manage health-related needs will improve Outcome: Progressing Goal: Goals will be collaboratively established with patient/family Outcome: Progressing   Problem: Self-Care: Goal: Ability to participate in self-care as condition permits will improve Outcome: Progressing Goal: Verbalization of feelings and concerns over difficulty with self-care will improve Outcome: Progressing Goal: Ability to communicate needs accurately will improve Outcome: Progressing   Problem: Nutrition: Goal: Risk of aspiration will decrease Outcome: Progressing Goal: Dietary intake will improve Outcome: Progressing   Problem: Education: Goal: Knowledge of General Education information will improve Description: Including pain rating scale, medication(s)/side effects and non-pharmacologic comfort measures Outcome:  Progressing   Problem: Health Behavior/Discharge Planning: Goal: Ability to manage health-related needs will improve Outcome: Progressing   Problem: Clinical Measurements: Goal: Ability to maintain clinical measurements within normal limits will improve Outcome: Progressing Goal: Will remain free from infection Outcome: Progressing Goal: Diagnostic test results will improve Outcome: Progressing Goal: Respiratory complications will improve Outcome: Progressing Goal: Cardiovascular complication will be avoided Outcome: Progressing   Problem: Activity: Goal: Risk for activity intolerance will decrease Outcome: Progressing   Problem: Nutrition: Goal: Adequate nutrition will be maintained Outcome: Progressing   Problem: Coping: Goal: Level of anxiety will decrease Outcome: Progressing   Problem: Elimination: Goal: Will not experience complications related to bowel motility Outcome: Progressing Goal: Will not experience complications related to urinary retention Outcome: Progressing   Problem: Pain Management: Goal: General experience of comfort will improve Outcome: Progressing   Problem: Safety: Goal: Ability to remain free from injury will improve Outcome: Progressing   Problem: Skin Integrity: Goal: Risk for impaired skin integrity will decrease Outcome: Progressing

## 2023-05-27 ENCOUNTER — Inpatient Hospital Stay (HOSPITAL_COMMUNITY): Payer: Medicaid Other

## 2023-05-27 DIAGNOSIS — M7989 Other specified soft tissue disorders: Secondary | ICD-10-CM | POA: Diagnosis not present

## 2023-05-27 DIAGNOSIS — J189 Pneumonia, unspecified organism: Secondary | ICD-10-CM | POA: Diagnosis not present

## 2023-05-27 DIAGNOSIS — I639 Cerebral infarction, unspecified: Secondary | ICD-10-CM

## 2023-05-27 DIAGNOSIS — I6389 Other cerebral infarction: Secondary | ICD-10-CM | POA: Diagnosis not present

## 2023-05-27 LAB — ECHOCARDIOGRAM COMPLETE BUBBLE STUDY
AR max vel: 2.39 cm2
AV Area VTI: 2.93 cm2
AV Area mean vel: 2.43 cm2
AV Mean grad: 4 mm[Hg]
AV Peak grad: 5.8 mm[Hg]
Ao pk vel: 1.2 m/s
Area-P 1/2: 2.24 cm2
Est EF: <20
MV VTI: 1.21 cm2
S' Lateral: 4.9 cm

## 2023-05-27 LAB — LIPID PANEL
Cholesterol: 80 mg/dL (ref 0–200)
HDL: 34 mg/dL — ABNORMAL LOW (ref >40)
LDL Cholesterol: 38 mg/dL (ref 0–99)
Total CHOL/HDL Ratio: 2.4 {ratio}
Triglycerides: 42 mg/dL (ref <150)
VLDL: 8 mg/dL (ref 0–40)

## 2023-05-27 LAB — HEMOGLOBIN A1C
Hgb A1c MFr Bld: 5.6 % (ref 4.8–5.6)
Mean Plasma Glucose: 114.02 mg/dL

## 2023-05-27 MED ORDER — PERFLUTREN LIPID MICROSPHERE
1.0000 mL | INTRAVENOUS | Status: AC | PRN
  Administered 2023-05-27: 2 mL via INTRAVENOUS

## 2023-05-27 MED ORDER — GUAIFENESIN ER 600 MG PO TB12
600.0000 mg | ORAL_TABLET | Freq: Two times a day (BID) | ORAL | Status: DC
  Administered 2023-05-27 – 2023-05-31 (×9): 600 mg via ORAL
  Filled 2023-05-27 (×9): qty 1

## 2023-05-27 MED ORDER — CARVEDILOL 3.125 MG PO TABS
3.1250 mg | ORAL_TABLET | Freq: Two times a day (BID) | ORAL | Status: DC
  Administered 2023-05-27 – 2023-05-31 (×9): 3.125 mg via ORAL
  Filled 2023-05-27 (×9): qty 1

## 2023-05-27 MED ORDER — ISOSORB DINITRATE-HYDRALAZINE 20-37.5 MG PO TABS
0.5000 | ORAL_TABLET | Freq: Three times a day (TID) | ORAL | Status: DC
  Administered 2023-05-27 – 2023-05-30 (×11): 0.5 via ORAL
  Filled 2023-05-27 (×11): qty 1

## 2023-05-27 NOTE — Progress Notes (Signed)
Heart Failure Navigator Progress Note  Assessed for Heart & Vascular TOC clinic readiness.  Patient does not meet criteria due to Advanced Heart Failure patient of Dr. Marca Ancona.  Navigator will sign off at this time.  Roxy Horseman, RN, BSN [facility] Heart Failure Navigator Secure Chat Only

## 2023-05-27 NOTE — Progress Notes (Signed)
SLP Cancellation Note  Patient Details Name: Craig Schmidt MRN: [id number] DOB: 1971/01/08   Cancelled treatment:       Reason Eval/Treat Not Completed: Pain and lability limiting ability to participate. Pt and his significant other asked SLP to return at a later time. Will continue attempts to f/u.    Gwynneth Aliment, M.A., CF-SLP Speech Language Pathology, Acute Rehabilitation Services  Secure Chat preferred [phone]  05/27/2023, 5:21 PM

## 2023-05-27 NOTE — TOC Initial Note (Signed)
Transition of Care [facility]) - Initial/Assessment Note    Patient Details  Name: Craig Schmidt MRN: [id number] Date of Birth: 1971-05-15  [facility] CM/SW Contact:    Craig Balo, RN Phone Number: 05/27/2023, 1:06 PM  Clinical Narrative:                  Pt and his significant other at the bedside are homeless. He states he is on a list to get an apartment. He was told it would be in October but states they have changed it to January. Pt did provide the numbers for the Case Managers working with him on this and CM has left a voicemail to see if this can be expedited. Pt states he has a PCP but misses appointments due to transportation. He voices that he uses his medicaid transportation but they sometimes are late or dont come at all.  Pt also admits to non compliance with is home medications at times.  CM will add transportation number to his AVS for medicaid. CM is not able to provide any other transportation resources.  Awaiting call back from his community CM.  He will need transportation at d/c.  TOC following.  Expected Discharge Plan: Homeless Shelter Barriers to Discharge: Continued Medical Work up   Patient Goals and CMS Choice            Expected Discharge Plan and Services   Discharge Planning Services: CM Consult   Living arrangements for the past 2 months: Homeless                                      Prior Living Arrangements/Services Living arrangements for the past 2 months: Homeless Lives with:: Significant Other Patient language and need for interpreter reviewed:: Yes          Care giver support system in place?: No (comment)   Criminal Activity/Legal Involvement Pertinent to Current Situation/Hospitalization: No - Comment as needed  Activities of Daily Living   ADL Screening (condition at time of admission) Independently performs ADLs?: No Does the patient have a NEW difficulty with  bathing/dressing/toileting/self-feeding that is expected to last >3 days?: No (wife helps get him up to br, dress, bathe) Does the patient have a NEW difficulty with getting in/out of bed, walking, or climbing stairs that is expected to last >3 days?: No Does the patient have a NEW difficulty with communication that is expected to last >3 days?: No Is the patient deaf or have difficulty hearing?: No Does the patient have difficulty seeing, even when wearing glasses/contacts?: No Does the patient have difficulty concentrating, remembering, or making decisions?: No  Permission Sought/Granted                  Emotional Assessment Appearance:: Appears older than stated age Attitude/Demeanor/Rapport: Engaged Affect (typically observed): Accepting Orientation: : Oriented to Self, Oriented to Place, Oriented to  Time, Oriented to Situation   Psych Involvement: No (comment)  Admission diagnosis:  Pneumonia [J18.9] Cerebrovascular accident (CVA), unspecified mechanism (HCC) [I63.9] Chest pain, unspecified type [R07.9] Pneumonia due to infectious organism, unspecified laterality, unspecified part of lung [J18.9] Patient Active Problem List   Diagnosis Date Noted   Troponin I above reference range 05/25/2023   CAP (community acquired pneumonia) 05/25/2023   Pneumonia 05/25/2023   Acute on chronic combined systolic and diastolic heart failure (HCC) 12/03/2022   Malnutrition of moderate  degree 11/30/2022   Acute on chronic combined systolic and diastolic CHF (congestive heart failure) (HCC) 11/29/2022   Acute on chronic combined systolic (congestive) and diastolic (congestive) heart failure (HCC) 11/28/2022   ACS (acute coronary syndrome) (HCC) 11/28/2022   Back pain 11/28/2022   Essential hypertension 11/28/2022   Dyslipidemia 11/28/2022   Noncompliance with medication regimen 11/28/2022   ST elevation myocardial infarction (STEMI) of inferolateral wall, subsequent episode of care (HCC)  12/19/2021   ST elevation myocardial infarction (STEMI) (HCC)    Ischemic cardiomyopathy    Cocaine abuse (HCC)    Smoker    Alcohol abuse    CVA (cerebral vascular accident) (HCC) 09/08/2021   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   Tear of MCL (medial collateral ligament) of knee 05/20/2015   Protein-calorie malnutrition, severe 05/17/2015   Lactic acidosis 05/15/2015   Left knee pain 05/15/2015   Lung nodules 05/15/2015   Hypoglycemia 05/14/2015   Hypothermia 05/14/2015   Acute encephalopathy 05/14/2015   Cocaine abuse with intoxication (HCC) 05/14/2015   Alcohol intoxication in active alcoholic (HCC) 05/14/2015   Sepsis (HCC) 05/14/2015   Homeless 05/14/2015   PCP:  Craig Fendt, NP Pharmacy:   [facility] [address] [place] [address] Phone: [phone] Fax: [phone]  [id number] [place], [place] - [address] [place] [address] Phone: [phone] Fax: [phone]  [facility] - [place], [place] - [address] [place] [address] Phone: [phone] Fax: [phone]  [place] [facility] [address] [place] [address] Phone: [phone] Fax: [phone]     Social Determinants of Health (SDOH) Social History: SDOH Screenings   Food Insecurity: Food Insecurity Present (05/25/2023)  Housing: High Risk (05/25/2023)  Transportation Needs: Unmet Transportation Needs (05/25/2023)  Utilities: At Risk (05/25/2023)  Alcohol Screen: High Risk (09/19/2021)  Depression (PHQ2-9): High Risk (06/13/2022)  Financial Resource Strain: High Risk (09/07/2022)  Stress: Stress Concern Present (10/16/2022)  Tobacco Use: High Risk (03/07/2023)   SDOH Interventions:     Readmission Risk Interventions     No data to display

## 2023-05-27 NOTE — Evaluation (Signed)
Occupational Therapy Evaluation Patient Details Name: Craig Schmidt MRN: [id number] DOB: October 27, 1970 Today's Date: 05/27/2023   History of Present Illness Pt is a 52 y/o M presenting to ED on 11/2 with new onset chest pain, SOB, worsening RUE pain and speech difficulty. CTH with acute L frontoparietal infarction. MRI shows 2 possible tiny acute cortical infarcts in bil frontal lobes, possibly artifactual per neuro 11/3. PMH includes CAD, CHF, prior CVA with residual R weakness, polysubstance abuse, homelessness.   Clinical Impression   Pt reports using rollator for mobility, he was living "on the streets" PTA, girlfriend present in room and states she was frequently assisting with ADLs. Pt reports R side pain, declines EOB/OOB mobility at this time, perform ROM of BUE/BLE, pt with R residual deficits from CVA but minimal movement noted. Pt educated on using pillow to brace abdomen when coughing to decr pain, along with alternating between R/L sides at bed level to decr risk of pressure injuries, pt and significant other verbalized understanding. Pt presenting with impairments listed below, will follow acutely. Patient will benefit from continued inpatient follow up therapy, <3 hours/day pending progression.       If plan is discharge home, recommend the following: Two people to help with walking and/or transfers;A lot of help with bathing/dressing/bathroom;Assistance with cooking/housework;Direct supervision/assist for medications management;Direct supervision/assist for financial management;Assist for transportation;Help with stairs or ramp for entrance;Supervision due to cognitive status    Functional Status Assessment  Patient has had a recent decline in their functional status and demonstrates the ability to make significant improvements in function in a reasonable and predictable amount of time.  Equipment Recommendations  Other (comment) (defer)    Recommendations for Other Services PT  consult     Precautions / Restrictions Precautions Precautions: Fall Restrictions Weight Bearing Restrictions: No      Mobility Bed Mobility               General bed mobility comments: pt declined, remains in L sidelying position during session    Transfers                   General transfer comment: pt declines 2/2 pain      Balance                                           ADL either performed or assessed with clinical judgement   ADL Overall ADL's : Needs assistance/impaired Eating/Feeding: Minimal assistance   Grooming: Minimal assistance   Upper Body Bathing: Moderate assistance   Lower Body Bathing: Maximal assistance   Upper Body Dressing : Moderate assistance   Lower Body Dressing: Maximal assistance   Toilet Transfer: Maximal assistance   Toileting- Clothing Manipulation and Hygiene: Total assistance Toileting - Clothing Manipulation Details (indicate cue type and reason): catheter     Functional mobility during ADLs: Maximal assistance       Vision   Vision Assessment?: No apparent visual deficits     Perception Perception: Not tested       Praxis Praxis: Not tested       Pertinent Vitals/Pain Pain Assessment Pain Assessment: Faces Pain Score: 8  Faces Pain Scale: Hurts whole lot Pain Location: R side     Extremity/Trunk Assessment Upper Extremity Assessment Upper Extremity Assessment: RUE deficits/detail RUE Deficits / Details: limited assessment at bed level, pt with minimal shoulder flexion and  elbow ROM, noted digit contractures at MCP in digits 4 and 5 some movement noted in digits 1-3 but needs further assessment RUE Sensation: WNL RUE Coordination: decreased fine motor;decreased gross motor   Lower Extremity Assessment Lower Extremity Assessment: Defer to PT evaluation   Cervical / Trunk Assessment Cervical / Trunk Assessment:  (unable to assess)   Communication  Communication Communication: No apparent difficulties   Cognition Arousal: Alert Behavior During Therapy: WFL for tasks assessed/performed, Flat affect Overall Cognitive Status: Impaired/Different from baseline                                 General Comments: significant other in room and answering many questions per pt, pt polite and follows commands with incr time, will further assess     General Comments  girlfriend present and helpful with PLOF    Exercises     Shoulder Instructions      Home Living Family/patient expects to be discharged to:: Shelter/Homeless                                 Additional Comments: reports living in the streets      Prior Functioning/Environment Prior Level of Function : Needs assist             Mobility Comments: rollator for mobility ADLs Comments: reports having assist from girlfrined for dressing tasks        OT Problem List: Decreased strength;Decreased range of motion;Decreased activity tolerance;Impaired balance (sitting and/or standing);Impaired UE functional use;Pain      OT Treatment/Interventions: Self-care/ADL training;Therapeutic exercise;Energy conservation;DME and/or AE instruction;Therapeutic activities;Patient/family education;Balance training;Neuromuscular education;Splinting;Manual therapy;Modalities    OT Goals(Current goals can be found in the care plan section) Acute Rehab OT Goals Patient Stated Goal: none stated OT Goal Formulation: With patient Time For Goal Achievement: 06/10/23 Potential to Achieve Goals: Good ADL Goals Pt Will Perform Upper Body Dressing: with min assist;sitting Pt Will Perform Lower Body Dressing: with min assist;sitting/lateral leans;sit to/from stand Pt Will Transfer to Toilet: with min assist;bedside commode;squat pivot transfer;stand pivot transfer Pt/caregiver will Perform Home Exercise Program: With written HEP provided;Right Upper extremity;With  Supervision  OT Frequency: Min 1X/week    Co-evaluation              AM-PAC OT "6 Clicks" Daily Activity     Outcome Measure Help from another person eating meals?: A Little Help from another person taking care of personal grooming?: A Little Help from another person toileting, which includes using toliet, bedpan, or urinal?: A Lot Help from another person bathing (including washing, rinsing, drying)?: A Lot Help from another person to put on and taking off regular upper body clothing?: A Lot Help from another person to put on and taking off regular lower body clothing?: A Lot 6 Click Score: 14   End of Session Nurse Communication: Mobility status  Activity Tolerance: Patient limited by pain Patient left: in bed;with call bell/phone within reach;with bed alarm set;with family/visitor present  OT Visit Diagnosis: Unsteadiness on feet (R26.81);Other abnormalities of gait and mobility (R26.89);Muscle weakness (generalized) (M62.81)                Time: 4098-1191 OT Time Calculation (min): 23 min Charges:  OT General Charges $OT Visit: 1 Visit OT Evaluation $OT Eval Low Complexity: 1 Low OT Treatments $Therapeutic Activity: 8-22 mins  Emmaly Leech K, OTD,  OTR/L SecureChat Preferred Acute Rehab [phone]   Carver Fila Koonce 05/27/2023, 3:20 PM

## 2023-05-27 NOTE — Plan of Care (Signed)
  Problem: Education: Goal: Knowledge of disease or condition will improve Outcome: Progressing Goal: Knowledge of secondary prevention will improve (MUST DOCUMENT ALL) Outcome: Progressing Goal: Knowledge of patient specific risk factors will improve Loraine Leriche N/A or DELETE if not current risk factor) Outcome: Progressing   Problem: Ischemic Stroke/TIA Tissue Perfusion: Goal: Complications of ischemic stroke/TIA will be minimized Outcome: Progressing   Problem: Coping: Goal: Will verbalize positive feelings about self Outcome: Progressing Goal: Will identify appropriate support needs Outcome: Progressing   Problem: Nutrition: Goal: Risk of aspiration will decrease Outcome: Progressing Goal: Dietary intake will improve Outcome: Progressing   Problem: Self-Care: Goal: Ability to participate in self-care as condition permits will improve Outcome: Progressing Goal: Verbalization of feelings and concerns over difficulty with self-care will improve Outcome: Progressing Goal: Ability to communicate needs accurately will improve Outcome: Progressing   Problem: Clinical Measurements: Goal: Ability to maintain clinical measurements within normal limits will improve Outcome: Progressing Goal: Will remain free from infection Outcome: Progressing Goal: Diagnostic test results will improve Outcome: Progressing Goal: Respiratory complications will improve Outcome: Progressing Goal: Cardiovascular complication will be avoided Outcome: Progressing

## 2023-05-27 NOTE — Progress Notes (Signed)
PROGRESS NOTE  Craig Schmidt  DOB: 02-02-1971  PCP: Rema Fendt, NP WGN:[id number]  DOA: 05/25/2023  LOS: 2 days  Hospital Day: 3  Brief narrative: Craig Schmidt is a 52 y.o. 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 Patient has mild chronic right upper extremity weakness from prior strokes.  He noticed worsening of that weakness in the last 2 to 3 days associated with numbness and tingling.  Also had occasional garbled speech.    In the ED, patient was afebrile, tachycardic to 100s, blood pressure in 120s, breathing on room air Labs showed WC count normal 10.4, hemoglobin 10, BUN/creatinine 18/1.32 Urine drug screen positive for cocaine and THC Blood culture sent Chest x-ray showed new anterior lower lobe consolidation, left lower lobe consolidation, new mild diffuse interstitial opacities CT head suggested acute left frontoparietal infarction.  Admitted to [facility] for further workup  Subjective: Patient was seen and examined this morning. Lying down on bed. Middle-aged African-American male.   He was in less pain than yesterday but still complains of back pain  oral hygiene poor  Assessment and plan: Multifocal pneumonia Patient with dyspnea, chest pain, productive cough  Chest x-ray showed new anterior lower lobe consolidation, left lower lobe consolidation, new mild diffuse interstitial opacities No fever, WBC count not elevated.  Procalcitonin level elevated to 0.35. Blood culture sent. Started on Unasyn and doxycycline Not on supplemental oxygen.  Cough improving.  Continue antibiotics antitussives today. Recent Labs  Lab 05/25/23 1339 05/25/23 2032 05/26/23 0454 05/26/23 0455  WBC 10.3 10.3  --  10.4   PROCALCITON  --   --  0.35  --    Acute stroke ruled out H/o prior several strokes On admission, patient complained of worsening weakness, garbled speech CT head as above suggested acute left frontoparietal infarction MRI brain showed 2 possible tiny acute cortical infarcts in the bilateral frontal lobes. Per neurology note, the findings and the images are more likely artifactual.  Even if these are acute infarcts, they are small enough that the risk of restarting anticoagulation outweighs the benefit. In the past, patient was on anticoagulation with Coumadin but he was not compliant to it. Currently patient is on Plavix and statin  Severe back pain Patient has significant lower back pain and tenderness. MRI lumbar spine 11/3 did not show any evidence of discitis or abscess. Patient probably has pain related to pneumonia.  Elevated troponin CAD/ h/o STEMI Mildly elevated, downtrending.   Has significant CAD risk factors and history of MI is likely due to patient's pneumonia as well as chronic kidney disease.   PTA meds- noncompliant to Coumadin, Plavix, statin Plavix and statin have been resumed. Recent Labs    05/25/23 1339 05/25/23 1720  TROPONINIHS 74* 59*   Cardiomyopathy Chronic systolic CHF Does not seem to be volume overloaded at this time. Blood pressure is stable as well.  Continue to monitor Echocardiogram 11/4 showed EF less than 20%, severely decreased function, global hypokinesis, grade 2 diastolic dysfunction, moderate to severely dilated LA.  EF is lowered last echo from May 2024 that showed 25 to 30%. PTA meds-he has been noncompliant to GDMT with Coreg, Jardiance, metolazone, Imdur, Lasix Resume Coreg, imdur today.   CKD 2 Creatinine at baseline. Recent Labs    11/28/22 1155 11/30/22 0931 12/01/22 0053  12/02/22 0033 12/03/22 0039 12/04/22 0047 01/23/23 1522 03/07/23 1202 05/25/23 1339 05/25/23 2032 05/26/23 0455  BUN 17 21* 26* 29* 32* 36* 22* 20  22*  --  18  CREATININE 1.69* 1.77* 1.93* 1.95* 2.16* 2.02* 1.70* 1.80* 1.48* 1.53* 1.32*  CO2 18* 32 28 28 25 23  21* 22 22  --  20*      Mobility: needs PT eval.  PT eval ordered.  Goals of care   Code Status: Full Code     DVT prophylaxis:  enoxaparin (LOVENOX) injection 40 mg Start: 05/26/23 0800 SCDs Start: 05/25/23 1820   Antimicrobials: Unasyn and doxycycline Fluid: Not on IV fluid Consultants: Neurology Family Communication: Significant other not at bedside today  Status: Inpatient Level of care:  Telemetry Medical   Patient is from: Homeless Needs to continue in-hospital care: Remains weak, continues to complain of pain Anticipated d/c to: Pending clinical course      Diet:  Diet Order             Diet Heart Room service appropriate? Yes; Fluid consistency: Thin  Diet effective now                   Scheduled Meds:  aspirin EC  81 mg Oral Daily   atorvastatin  40 mg Oral Daily   carvedilol  3.125 mg Oral BID   clopidogrel  75 mg Oral Daily   empagliflozin  10 mg Oral Daily   enoxaparin (LOVENOX) injection  40 mg Subcutaneous Q24H   guaiFENesin  600 mg Oral BID   isosorbide-hydrALAZINE  0.5 tablet Oral TID   sodium chloride flush  3 mL Intravenous Q12H    PRN meds: acetaminophen **OR** acetaminophen, ipratropium, nitroGLYCERIN, oxyCODONE, polyethylene glycol   Infusions:   ampicillin-sulbactam (UNASYN) IV 3 g (05/27/23 1157)   doxycycline (VIBRAMYCIN) IV Stopped (05/27/23 1110)    Antimicrobials: Anti-infectives (From admission, onward)    Start     Dose/Rate Route Frequency Ordered Stop   05/26/23 0800  doxycycline (VIBRAMYCIN) 100 mg in dextrose 5 % 250 mL IVPB        100 mg 125 mL/hr over 120 Minutes Intravenous Every 12 hours 05/25/23 1815 05/29/23 0759   05/25/23 1830  Ampicillin-Sulbactam (UNASYN) 3 g in sodium chloride 0.9 % 100 mL IVPB        3 g 200 mL/hr over 30 Minutes Intravenous Every 8 hours 05/25/23 1827     05/25/23  1530  cefTRIAXone (ROCEPHIN) 1 g in sodium chloride 0.9 % 100 mL IVPB        1 g 200 mL/hr over 30 Minutes Intravenous  Once 05/25/23 1523 05/25/23 1627   05/25/23 1530  doxycycline (VIBRAMYCIN) 100 mg in dextrose 5 % 250 mL IVPB        100 mg 125 mL/hr over 120 Minutes Intravenous  Once 05/25/23 1523 05/25/23 1918       Objective: Vitals:   05/27/23 0825 05/27/23 1120  BP: (!) 124/90 (!) 131/98  Pulse: (!) 108 (!) 110  Resp: 18 17  Temp: 99.3 F (37.4 C) 98.9 F (37.2 C)  SpO2: 100% 100%    Intake/Output Summary (Last 24 hours) at 05/27/2023 1330 Last data filed at 05/27/2023 1035 Gross per 24 hour  Intake 2970.52 ml  Output 200 ml  Net 2770.52 ml   Filed Weights   05/25/23 1328  Weight: 68.9 kg   Weight change:  Body mass index is 23.79 kg/m.   Physical Exam: General exam: Middle-aged  African-American male Skin: No rashes, lesions or ulcers. HEENT: Atraumatic, normocephalic, no obvious bleeding Lungs: Diminished in both bases, coughs on deep breathing CVS: Regular rate and rhythm, no murmur GI/Abd soft, nontender, nondistended, bowels are present Back: Vague back pain. CNS: Alert, awake, oriented x 3 able to answer questions Psychiatry: Mood appropriate today Extremities: No pedal edema, no calf tenderness  Data Review: I have personally reviewed the laboratory data and studies available.  F/u labs ordered Unresulted Labs (From admission, onward)     Start     Ordered   06/01/23 0500  Creatinine, serum  (enoxaparin (LOVENOX)    CrCl >/= 30 ml/min)  Weekly,   R     Comments: while on enoxaparin therapy    05/25/23 1820   05/25/23 1820  Respiratory (~20 pathogens) panel by PCR  (Respiratory panel by PCR (~20 pathogens, ~24 hr TAT)  w precautions)  Once,   R        05/25/23 1820   05/25/23 1811  Culture, blood (Routine X 2) w Reflex to ID Panel  BLOOD CULTURE X 2,   R      05/25/23 1810            Total time spent in review of labs and imaging,  patient evaluation, formulation of plan, documentation and communication with family: 45 minutes  Signed, Lorin Glass, MD [facility] 05/27/2023

## 2023-05-27 NOTE — Progress Notes (Signed)
PT Cancellation Note  Patient Details Name: Craig Schmidt MRN: [id number] DOB: 1970/10/31   Cancelled Treatment:    Reason Eval/Treat Not Completed: (P) Pain limiting ability to participate Pt started Eval with OT but refusing all movement when PT arrived. PT will follow back for Eval tomorrow.    Elon Alas Fleet 05/27/2023, 2:28 PM

## 2023-05-27 NOTE — Progress Notes (Signed)
PT Cancellation Note  Patient Details Name: Craig Schmidt MRN: [id number] DOB: [birth year]   Cancelled Treatment:    Reason Eval/Treat Not Completed: (P) Other (comment) Pt awaiting [place] to rule out DVT. PT will follow back after procedure  Lanora Manis B. Beverely Risen PT, DPT Acute Rehabilitation Services Please use secure chat or  Call Office [phone]    Elon Alas [facility] 05/27/2023, 8:33 AM

## 2023-05-28 DIAGNOSIS — J189 Pneumonia, unspecified organism: Secondary | ICD-10-CM | POA: Diagnosis not present

## 2023-05-28 MED ORDER — DOXYCYCLINE HYCLATE 100 MG PO TABS
100.0000 mg | ORAL_TABLET | Freq: Two times a day (BID) | ORAL | Status: AC
  Administered 2023-05-28 – 2023-05-29 (×2): 100 mg via ORAL
  Filled 2023-05-28 (×2): qty 1

## 2023-05-28 NOTE — Progress Notes (Signed)
SLP Cancellation Note  Patient Details Name: RIGEL FILSINGER MRN: [id number] DOB: 07/15/1971   Cancelled treatment:       Reason Eval/Treat Not Completed: Patient declined, no reason specified. Pt sleeping soundly. Significant other at bedside. Pt did not wake to loud voice and significant other requested SLP re-attempt another time. SLP will re-attempt as coordination of care permits.    Ellery Plunk 05/28/2023, 11:14 AM

## 2023-05-28 NOTE — Plan of Care (Signed)
  Problem: Education: Goal: Knowledge of disease or condition will improve Outcome: Progressing Goal: Knowledge of secondary prevention will improve (MUST DOCUMENT ALL) Outcome: Progressing Goal: Knowledge of patient specific risk factors will improve Loraine Leriche N/A or DELETE if not current risk factor) Outcome: Progressing   Problem: Ischemic Stroke/TIA Tissue Perfusion: Goal: Complications of ischemic stroke/TIA will be minimized Outcome: Progressing   Problem: Coping: Goal: Will verbalize positive feelings about self Outcome: Progressing Goal: Will identify appropriate support needs Outcome: Progressing   Problem: Health Behavior/Discharge Planning: Goal: Ability to manage health-related needs will improve Outcome: Progressing Goal: Goals will be collaboratively established with patient/family Outcome: Progressing   Problem: Self-Care: Goal: Ability to participate in self-care as condition permits will improve Outcome: Progressing Goal: Verbalization of feelings and concerns over difficulty with self-care will improve Outcome: Progressing Goal: Ability to communicate needs accurately will improve Outcome: Progressing   Problem: Nutrition: Goal: Risk of aspiration will decrease Outcome: Progressing Goal: Dietary intake will improve Outcome: Progressing   Problem: Education: Goal: Knowledge of General Education information will improve Description: Including pain rating scale, medication(s)/side effects and non-pharmacologic comfort measures Outcome: Progressing   Problem: Health Behavior/Discharge Planning: Goal: Ability to manage health-related needs will improve Outcome: Progressing   Problem: Clinical Measurements: Goal: Ability to maintain clinical measurements within normal limits will improve Outcome: Progressing

## 2023-05-28 NOTE — Progress Notes (Signed)
Pharmacy Antibiotic Note  Craig Schmidt is a 52 y.o. male admitted on 05/25/2023 presenting with coughing, concern for aspiration.  Pharmacy has been consulted for Unasyn dosing.  Plan: Continue Unasyn 3g IV q 8 hours Adjust doxycycline to 100mg  PO q 12 hours through 11/6 F/u LOT  Height: 5\' 7"  (170.2 cm) Weight: 68.9 kg (151 lb 14.4 oz) IBW/kg (Calculated) : 66.1  Temp (24hrs), Avg:98.7 F (37.1 C), Min:98.4 F (36.9 C), Max:99.6 F (37.6 C)  Recent Labs  Lab 05/25/23 1339 05/25/23 2032 05/26/23 0455  WBC 10.3 10.3 10.4  CREATININE 1.48* 1.53* 1.32*    Estimated Creatinine Clearance: 62.6 mL/min (A) (by C-G formula based on SCr of 1.32 mg/dL (H)).    Allergies  Allergen Reactions   Shellfish Allergy Anaphylaxis   Peanut-Containing Drug Products Itching    Rexford Maus, PharmD, BCPS 05/28/2023 9:20 AM

## 2023-05-28 NOTE — Progress Notes (Signed)
PT Cancellation Note  Patient Details Name: Craig Schmidt MRN: [id number] DOB: 05-Jan-1971   Cancelled Treatment:    Reason Eval/Treat Not Completed: (P) Pain limiting ability to participate After having pain medication administered 25 minutes prior to PT arrival. Pt refused therapy due to 10/10 pain. Spent 20 minutes with pt and educated him on oral pain medication and optimal therapy time after administration, various excuses given, first the pain medication had already worn off, 2nd pain medication given with other scheduled oral medicines which caused pain medication to not be effective, and finally, Oxy doesn't really work for him he prefers Microbiologist. PT will attempt to follow back for Evaluation tomorrow.  Matina Rodier B. Beverely Risen PT, DPT Acute Rehabilitation Services Please use secure chat or  Call Office [phone]    Elon Alas [facility] 05/28/2023, 11:57 AM

## 2023-05-28 NOTE — TOC Progression Note (Signed)
Transition of Care [facility]) - Progression Note    Patient Details  Name: Craig Schmidt MRN: [id number] Date of Birth: 1970-12-16  [facility] CM/SW Contact  [name], RN Phone Number: 05/28/2023, 11:56 AM  Clinical Narrative:     CM spoke to [name] who is a peer support for the patient in the community. He states that pt is on the apartment list and should be getting an apartment any day.  [name] says that his group will also assist with transportation if the patient will call and let them know about appointments.  TOC following.  Expected Discharge Plan: Skilled Nursing Facility Barriers to Discharge: Continued Medical Work up  Expected Discharge Plan and Services   Discharge Planning Services: CM Consult   Living arrangements for the past 2 months: Homeless                                       Social Determinants of Health (SDOH) Interventions SDOH Screenings   Food Insecurity: Food Insecurity Present (05/25/2023)  Housing: High Risk (05/25/2023)  Transportation Needs: Unmet Transportation Needs (05/25/2023)  Utilities: At Risk (05/25/2023)  Alcohol Screen: High Risk (09/19/2021)  Depression (PHQ2-9): High Risk (06/13/2022)  Financial Resource Strain: High Risk (09/07/2022)  Stress: Stress Concern Present (10/16/2022)  Tobacco Use: High Risk (03/07/2023)    Readmission Risk Interventions     No data to display

## 2023-05-28 NOTE — Progress Notes (Addendum)
PROGRESS NOTE  Craig Schmidt  DOB: Feb 18, 1971  PCP: Rema Fendt, NP ZOX:[id number]  DOA: 05/25/2023  LOS: 3 days  Hospital Day: 4  Brief narrative: Craig Schmidt is a 52 y.o. 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  He noticed worsening of that weakness in the last 2 to 3 days associated with numbness and tingling.  Also had occasional garbled speech.    In the ED, patient was afebrile, tachycardic to 100s, blood pressure in 120s, breathing on room air Labs showed WBC count normal 10.4, hemoglobin 10, BUN/creatinine 18/1.32, procalcitonin level elevated. Urine drug screen positive for cocaine and THC Blood culture sent Chest x-ray showed new anterior lower lobe consolidation, left lower lobe consolidation, new mild diffuse interstitial opacities. CT head suggested acute left frontoparietal infarction.  Admitted to [facility] for further workup See below for details  Subjective: Patient was seen and examined this morning. Lying down on bed. Middle-aged African-American male.   Continues complain of 'rib pain' on deep breathing. Significant other at bedside.  Assessment and plan: Multifocal pneumonia Patient presented with dyspnea, chest pain, productive cough  Chest x-ray showed new anterior lower lobe consolidation, left lower lobe consolidation, new mild diffuse interstitial opacities Currently on a course of Unasyn and doxycycline Blood culture without any growth. No fever.  WBC count normal. Not on supplemental oxygen.   Cough improving with antitussives Rib pain likely is pleuritic chest pain with pneumonia. Recent Labs  Lab 05/25/23 1339 05/25/23 2032 05/26/23 0454 05/26/23 0455  WBC 10.3 10.3  --  10.4   PROCALCITON  --   --  0.35  --    Acute stroke ruled out H/o prior several strokes Patient has mild chronic right upper extremity weakness from prior strokes. On admission, patient complained of worsening weakness, garbled speech CT head as above raised the possibility of acute left frontoparietal infarction MRI brain showed 2 possible tiny acute cortical infarcts in the bilateral frontal lobes. Neurology was consulted. In the past, patient was on anticoagulation with Coumadin but he was not compliant to it. Per neurology note, the findings and the images are more likely artifactual.  Even if these are acute infarcts, they are small enough that the risk of restarting anticoagulation outweighs the benefit. Currently patient is on Plavix and statin  Severe back pain Patient has significant lower back pain and tenderness. MRI lumbar spine 11/3 did not show any evidence of discitis or abscess. Patient probably has pleuritic pain related to pneumonia.  Elevated troponin CAD/ h/o STEMI Mildly elevated, downtrending.   Has significant CAD risk factors and history of MI is likely due to patient's pneumonia as well as chronic kidney disease.   PTA meds- noncompliant to Coumadin, Plavix, statin Plavix and statin have been resumed. Recent Labs    05/25/23 1339 05/25/23 1720  TROPONINIHS 74* 59*   Cardiomyopathy Chronic systolic CHF Does not seem to be volume overloaded at this time. Blood pressure is stable as well.  Continue to monitor Echocardiogram 11/4 showed EF less than 20%, severely decreased function, global hypokinesis, grade 2 diastolic dysfunction, moderate to severely dilated LA.  EF is lowered last echo from May 2024 that showed 25 to 30%. PTA meds-he has been noncompliant to GDMT with Coreg, Jardiance, metolazone, Imdur, Lasix 11/4, resumed Coreg, imdur, Jardiance. Looks  euvolemic.  CKD 2 Creatinine at baseline. Recent Labs    11/28/22 1155 11/30/22 0931  12/01/22 0053 12/02/22 0033 12/03/22 0039 12/04/22 0047 01/23/23 1522 03/07/23 1202 05/25/23 1339 05/25/23 2032 05/26/23 0455  BUN 17 21* 26* 29* 32* 36* 22* 20 22*  --  18  CREATININE 1.69* 1.77* 1.93* 1.95* 2.16* 2.02* 1.70* 1.80* 1.48* 1.53* 1.32*  CO2 18* 32 28 28 25 23  21* 22 22  --  20*      Mobility: Pending PT eval  Goals of care   Code Status: Full Code     DVT prophylaxis:  enoxaparin (LOVENOX) injection 40 mg Start: 05/26/23 0800 SCDs Start: 05/25/23 1820   Antimicrobials: Unasyn and doxycycline Fluid: Not on IV fluid Consultants: Neurology Family Communication: Significant other at bedside today  Status: Inpatient Level of care:  Telemetry Medical   Patient is from: Homeless Needs to continue in-hospital care: Remains weak, continues to complain of pain.  Continue IV antibiotics.  Pending PT eval Anticipated d/c to: Homeless shelter versus SNF, depends on PT eval    Diet:  Diet Order             Diet Heart Room service appropriate? Yes; Fluid consistency: Thin  Diet effective now                   Scheduled Meds:  aspirin EC  81 mg Oral Daily   atorvastatin  40 mg Oral Daily   carvedilol  3.125 mg Oral BID   clopidogrel  75 mg Oral Daily   doxycycline  100 mg Oral Q12H   empagliflozin  10 mg Oral Daily   enoxaparin (LOVENOX) injection  40 mg Subcutaneous Q24H   guaiFENesin  600 mg Oral BID   isosorbide-hydrALAZINE  0.5 tablet Oral TID   sodium chloride flush  3 mL Intravenous Q12H    PRN meds: acetaminophen **OR** acetaminophen, ipratropium, nitroGLYCERIN, oxyCODONE, polyethylene glycol   Infusions:   ampicillin-sulbactam (UNASYN) IV 3 g (05/28/23 0232)    Antimicrobials: Anti-infectives (From admission, onward)    Start     Dose/Rate Route Frequency Ordered Stop   05/28/23 2200  doxycycline (VIBRA-TABS) tablet 100 mg        100 mg Oral Every 12 hours 05/28/23 0922 05/29/23 2159   05/26/23 0800  doxycycline (VIBRAMYCIN) 100  mg in dextrose 5 % 250 mL IVPB  Status:  Discontinued        100 mg 125 mL/hr over 120 Minutes Intravenous Every 12 hours 05/25/23 1815 05/28/23 0922   05/25/23 1830  Ampicillin-Sulbactam (UNASYN) 3 g in sodium chloride 0.9 % 100 mL IVPB        3 g 200 mL/hr over 30 Minutes Intravenous Every 8 hours 05/25/23 1827     05/25/23 1530  cefTRIAXone (ROCEPHIN) 1 g in sodium chloride 0.9 % 100 mL IVPB        1 g 200 mL/hr over 30 Minutes Intravenous  Once 05/25/23 1523 05/25/23 1627   05/25/23 1530  doxycycline (VIBRAMYCIN) 100 mg in dextrose 5 % 250 mL IVPB        100 mg 125 mL/hr over 120 Minutes Intravenous  Once 05/25/23 1523 05/25/23 1918       Objective: Vitals:   05/28/23 0300 05/28/23 0741  BP: 123/80 117/81  Pulse: 80 97  Resp: 19 20  Temp: 98.6 F (37 C) 98.5 F (36.9 C)  SpO2: 99% 97%    Intake/Output Summary (Last 24 hours) at 05/28/2023 1022 Last  data filed at 05/28/2023 0545 Gross per 24 hour  Intake 383 ml  Output 1000 ml  Net -617 ml   Filed Weights   05/25/23 1328  Weight: 68.9 kg   Weight change:  Body mass index is 23.79 kg/m.   Physical Exam: General exam: Middle-aged African-American male Skin: No rashes, lesions or ulcers. HEENT: Atraumatic, normocephalic, no obvious bleeding.  Poor oral hygiene. Lungs: Diminished in both bases, coughs on deep breathing.  Upper back rib pain on deep breathing CVS: Regular rate and rhythm, no murmur GI/Abd soft, nontender, nondistended, bowels are present Back: Vague back pain. CNS: Alert, awake, oriented x 3 able to answer questions Psychiatry: Mood appropriate  Extremities: No pedal edema, no calf tenderness  Data Review: I have personally reviewed the laboratory data and studies available.  F/u labs ordered Unresulted Labs (From admission, onward)     Start     Ordered   06/01/23 0500  Creatinine, serum  (enoxaparin (LOVENOX)    CrCl >/= 30 ml/min)  Weekly,   R     Comments: while on enoxaparin therapy     05/25/23 1820            Total time spent in review of labs and imaging, patient evaluation, formulation of plan, documentation and communication with family: 45 minutes  Signed, Lorin Glass, MD [facility] 05/28/2023

## 2023-05-28 NOTE — Plan of Care (Signed)
  Problem: Education: Goal: Knowledge of disease or condition will improve Outcome: Progressing   Problem: Coping: Goal: Will verbalize positive feelings about self Outcome: Progressing   Problem: Health Behavior/Discharge Planning: Goal: Ability to manage health-related needs will improve Outcome: Progressing   Problem: Self-Care: Goal: Ability to participate in self-care as condition permits will improve Outcome: Progressing   Problem: Nutrition: Goal: Dietary intake will improve Outcome: Progressing   Problem: Education: Goal: Knowledge of General Education information will improve Description: Including pain rating scale, medication(s)/side effects and non-pharmacologic comfort measures Outcome: Progressing   Problem: Activity: Goal: Risk for activity intolerance will decrease Outcome: Progressing

## 2023-05-29 ENCOUNTER — Encounter (HOSPITAL_COMMUNITY): Payer: Self-pay | Admitting: Internal Medicine

## 2023-05-29 DIAGNOSIS — M546 Pain in thoracic spine: Secondary | ICD-10-CM

## 2023-05-29 DIAGNOSIS — J189 Pneumonia, unspecified organism: Secondary | ICD-10-CM | POA: Diagnosis not present

## 2023-05-29 LAB — COMPREHENSIVE METABOLIC PANEL
ALT: 11 U/L (ref 0–44)
AST: 26 U/L (ref 15–41)
Albumin: 1.9 g/dL — ABNORMAL LOW (ref 3.5–5.0)
Alkaline Phosphatase: 84 U/L (ref 38–126)
Anion gap: 12 (ref 5–15)
BUN: 19 mg/dL (ref 6–20)
CO2: 21 mmol/L — ABNORMAL LOW (ref 22–32)
Calcium: 8.2 mg/dL — ABNORMAL LOW (ref 8.9–10.3)
Chloride: 104 mmol/L (ref 98–111)
Creatinine, Ser: 1.48 mg/dL — ABNORMAL HIGH (ref 0.61–1.24)
GFR, Estimated: 57 mL/min — ABNORMAL LOW (ref >60)
Glucose, Bld: 115 mg/dL — ABNORMAL HIGH (ref 70–99)
Potassium: 3.9 mmol/L (ref 3.5–5.1)
Sodium: 137 mmol/L (ref 135–145)
Total Bilirubin: 0.7 mg/dL (ref <1.2)
Total Protein: 5.5 g/dL — ABNORMAL LOW (ref 6.5–8.1)

## 2023-05-29 LAB — CBC
HCT: 29.5 % — ABNORMAL LOW (ref 39.0–52.0)
Hemoglobin: 9.7 g/dL — ABNORMAL LOW (ref 13.0–17.0)
MCH: 30.5 pg (ref 26.0–34.0)
MCHC: 32.9 g/dL (ref 30.0–36.0)
MCV: 92.8 fL (ref 80.0–100.0)
Platelets: 276 10*3/uL (ref 150–400)
RBC: 3.18 MIL/uL — ABNORMAL LOW (ref 4.22–5.81)
RDW: 13.8 % (ref 11.5–15.5)
WBC: 10.3 10*3/uL (ref 4.0–10.5)
nRBC: 0.2 % (ref 0.0–0.2)

## 2023-05-29 LAB — URINALYSIS, ROUTINE W REFLEX MICROSCOPIC
Bacteria, UA: NONE SEEN
Bilirubin Urine: NEGATIVE
Glucose, UA: >=500 mg/dL — AB
Hgb urine dipstick: NEGATIVE
Ketones, ur: NEGATIVE mg/dL
Leukocytes,Ua: NEGATIVE
Nitrite: NEGATIVE
Protein, ur: 30 mg/dL — AB
Specific Gravity, Urine: 1.03 (ref 1.005–1.030)
pH: 6 (ref 5.0–8.0)

## 2023-05-29 MED ORDER — METHOCARBAMOL 750 MG PO TABS
750.0000 mg | ORAL_TABLET | Freq: Four times a day (QID) | ORAL | Status: DC | PRN
  Administered 2023-05-29 – 2023-05-30 (×3): 750 mg via ORAL
  Filled 2023-05-29 (×3): qty 1

## 2023-05-29 NOTE — Evaluation (Signed)
Speech Language Pathology Evaluation Patient Details Name: Craig Schmidt MRN: [id number] DOB: Jun 29, 1971 Today's Date: 05/29/2023 Time: 0865-7846 SLP Time Calculation (min) (ACUTE ONLY): 13 min  Problem List:  Patient Active Problem List   Diagnosis Date Noted   Troponin I above reference range 05/25/2023   CAP (community acquired pneumonia) 05/25/2023   Pneumonia 05/25/2023   Acute on chronic combined systolic and diastolic heart failure (HCC) 12/03/2022   Malnutrition of moderate degree 11/30/2022   Acute on chronic combined systolic and diastolic CHF (congestive heart failure) (HCC) 11/29/2022   Acute on chronic combined systolic (congestive) and diastolic (congestive) heart failure (HCC) 11/28/2022   ACS (acute coronary syndrome) (HCC) 11/28/2022   Back pain 11/28/2022   Essential hypertension 11/28/2022   Dyslipidemia 11/28/2022   Noncompliance with medication regimen 11/28/2022   ST elevation myocardial infarction (STEMI) of inferolateral wall, subsequent episode of care (HCC) 12/19/2021   ST elevation myocardial infarction (STEMI) (HCC)    Ischemic cardiomyopathy    Cocaine abuse (HCC)    Smoker    Alcohol abuse    CVA (cerebral vascular accident) (HCC) 09/08/2021   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   Tear of MCL (medial collateral ligament) of knee 05/20/2015   Protein-calorie malnutrition, severe 05/17/2015   Lactic acidosis 05/15/2015   Left knee pain 05/15/2015   Lung nodules 05/15/2015   Hypoglycemia 05/14/2015   Hypothermia 05/14/2015   Acute encephalopathy 05/14/2015   Cocaine abuse with intoxication (HCC) 05/14/2015   Alcohol intoxication in active alcoholic (HCC) 05/14/2015   Sepsis (HCC) 05/14/2015   Homeless 05/14/2015   Past Medical History:  Past Medical History:  Diagnosis Date   Alcohol abuse    Asthma    Brain tumor (HCC)    CAD (coronary artery disease)    Chronic HFrEF (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)     Chronic kidney disease, stage 3 (HCC)    Cocaine abuse (HCC)    History of medication noncompliance    Homelessness    MI (myocardial infarction) (HCC)    STEMI (ST elevation myocardial infarction) (HCC)    Stroke [facility])    Past Surgical History:  Past Surgical History:  Procedure Laterality Date   BUBBLE STUDY  09/11/2021   Procedure: BUBBLE STUDY;  Surgeon: Pricilla Riffle, MD;  Location: [facility] ENDOSCOPY;  Service: Cardiovascular;;   LEFT HEART CATH AND CORONARY ANGIOGRAPHY N/A 12/19/2021   Procedure: LEFT HEART CATH AND CORONARY ANGIOGRAPHY;  Surgeon: Lyn Records, MD;  Location: [facility] INVASIVE CV LAB;  Service: Cardiovascular;  Laterality: N/A;   RIGHT/LEFT HEART CATH AND CORONARY ANGIOGRAPHY N/A 04/13/2022   Procedure: RIGHT/LEFT HEART CATH AND CORONARY ANGIOGRAPHY;  Surgeon: Laurey Morale, MD;  Location[facility] INVASIVE CV LAB;  Service: Cardiovascular;  Laterality: N/A;   TEE WITHOUT CARDIOVERSION N/A 09/11/2021   Procedure: TRANSESOPHAGEAL ECHOCARDIOGRAM (TEE);  Surgeon: Pricilla Riffle, MD;  Location: [facility] ENDOSCOPY;  Service: Cardiovascular;  Laterality: N/A;   HPI:  Craig Schmidt is a 52 yo male presenting to ED with chest pain, SOB, and worsening RUE weakness on chronic weakness from prior CVAs. Found to have CAP and elevated troponin. MRI Brain with two acute cortical infarcts in bilateral frontal lobes. PMH includes asthma, CAD, CVA, chronic combined CHF, brain tumor   Assessment / Plan / Recommendation Clinical Impression  Pt reports feeling signicantly different than baseline. No family present to confirm PLOF. He is oriented to self and situation but remains disoriented to time and place even given  binary choices. He presents with mild dysarthria which affects intelligibility at the conversation level. Pt was able to repeat up to two digits but was unsuccessful as complexity increased. He appears to have difficulties related to sustained attention. This evaluation was limited by  lability and 10/10 pain. Suspect pt is experiencing acute cognitive deficits. SLP will continue following to target and to conduct further assessment of deficits listed above.    SLP Assessment  SLP Recommendation/Assessment: Patient needs continued Speech Lanaguage Pathology Services SLP Visit Diagnosis: Dysarthria and anarthria (R47.1);Cognitive communication deficit (R41.841)    Recommendations for follow up therapy are one component of a multi-disciplinary discharge planning process, led by the attending physician.  Recommendations may be updated based on patient status, additional functional criteria and insurance authorization.    Follow Up Recommendations  Skilled nursing-short term rehab (<3 hours/day)    Assistance Recommended at Discharge  Frequent or constant Supervision/Assistance  Functional Status Assessment Patient has had a recent decline in their functional status and demonstrates the ability to make significant improvements in function in a reasonable and predictable amount of time.  Frequency and Duration min 2x/week  2 weeks      SLP Evaluation Cognition  Overall Cognitive Status: Impaired/Different from baseline Arousal/Alertness: Awake/alert Orientation Level: Oriented to person;Oriented to situation;Disoriented to time;Disoriented to place Attention: Sustained Sustained Attention: Impaired Sustained Attention Impairment: Verbal basic Memory: Impaired Memory Impairment: Decreased recall of new information Awareness: Impaired Awareness Impairment: Emergent impairment Problem Solving: Impaired Problem Solving Impairment: Verbal basic       Comprehension  Auditory Comprehension Overall Auditory Comprehension: Impaired    Expression Verbal Expression Overall Verbal Expression: Impaired Repetition: Impaired Level of Impairment: Word level Naming: Impairment Divergent: 0-24% accurate   Oral / Motor  Oral Motor/Sensory Function Overall Oral Motor/Sensory  Function: Within functional limits Motor Speech Overall Motor Speech: Impaired Respiration: Within functional limits Phonation: Normal Resonance: Within functional limits Articulation: Impaired Level of Impairment: Conversation Intelligibility: Intelligibility reduced Conversation: 75-100% accurate            Gwynneth Aliment, M.A., CF-SLP Speech Language Pathology, Acute Rehabilitation Services  Secure Chat preferred [phone]  05/29/2023, 5:19 PM

## 2023-05-29 NOTE — Plan of Care (Signed)

## 2023-05-29 NOTE — Progress Notes (Signed)
Orthopedic Tech Progress Note Patient Details:  Craig Schmidt April 03, 1971 [id number] Large arm sling was delivered to bedside.  Ortho Devices Type of Ortho Device: Arm sling Ortho Device/Splint Location: RUE Ortho Device/Splint Interventions: Application   Post Interventions Patient Tolerated: Well  [name] [name] 05/29/2023, 3:56 PM

## 2023-05-29 NOTE — Progress Notes (Signed)
PROGRESS NOTE    Craig Schmidt  NIO:[id number] DOB: Aug 06, 1970 DOA: 05/25/2023 PCP: Rema Fendt, NP   Brief Narrative: Craig Schmidt is a 52 y.o. male with a history of homelessness, polysubstance abuse, medical non-adherence, hypertension, hyperlipidemia, stroke, CAD/STEMI, cardiomyopathy, chronic systolic heart failure, CKD, brain tumor.  Patient presented secondary to chest pain with evidence of pneumonia. Antibiotics started for treatment.   Assessment and Plan:  Multifocal pneumonia Patient with dyspnea, chest pain and productive cough with chest x-ray significant for right and left lower lobe consolidation with interstitial opacities. Patient started on Ceftriaxone and doxycycline, transitioned to Unasyn and doxycycline. Doxycycline course completed. -Continue Unasyn  History of stroke Noted. -Continue aspirin and Plavix  Right-sided back pain Consistent with musculoskeletal etiology as pain is reproducible. Complicated by underlying pneumonia diagnosis. Urinalysis obtained and is without hematuria. -Continue Tylenol, heating pad -Add Robaxin  CAD History of STEMI. Patient with troponin elevation this admission with peak troponin of 74 with delta of 59, not consistent with ACS. -Continue Aspirin, Lipitor and Plavix  Chronic systolic heart failure Stable. No evidence of acute failure. -Continue Jardiance and Coreg  Primary hypertension -Continue Coreg and Bidil  CKD stage II Baseline creatinine appears to be around 1.2-1.4. Stable.   DVT prophylaxis: Lovenox Code Status:   Code Status: Full Code Family Communication: Wife at bedside Disposition Plan: Discharge to SNF once bed is available   Consultants:  None  Procedures:  None  Antimicrobials: Ceftriaxone Doxycycline Unasyn    Subjective: Patient reports continued right sided back pain. No other associated symptoms.  Objective: BP (!) 126/92 (BP Location: Right Arm)   Pulse 98    Temp (!) 97.4 F (36.3 C) (Oral)   Resp 18   Ht 5\' 7"  (1.702 m)   Wt 68.9 kg   SpO2 97%   BMI 23.79 kg/m   Examination:  General exam: Appears calm and comfortable Respiratory system: Clear to auscultation. Respiratory effort normal. Cardiovascular system: S1 & S2 heard, Normal rate with regular rhythm. Gastrointestinal system: Abdomen is nondistended, soft and nontender. No organomegaly or masses felt. Normal bowel sounds heard. Central nervous system: Alert and oriented. No focal neurological deficits. Musculoskeletal: No edema. No calf tenderness. Some tenderness of right back Skin: No cyanosis. No rashes Psychiatry: Judgement and insight appear normal. Mood & affect appropriate.    Data Reviewed: I have personally reviewed following labs and imaging studies  CBC Lab Results  Component Value Date   WBC 10.3 05/29/2023   RBC 3.18 (L) 05/29/2023   HGB 9.7 (L) 05/29/2023   HCT 29.5 (L) 05/29/2023   MCV 92.8 05/29/2023   MCH 30.5 05/29/2023   PLT 276 05/29/2023   MCHC 32.9 05/29/2023   RDW 13.8 05/29/2023   LYMPHSABS 0.9 05/25/2023   MONOABS 0.6 05/25/2023   EOSABS 0.0 05/25/2023   BASOSABS 0.0 05/25/2023     Last metabolic panel Lab Results  Component Value Date   NA 137 05/29/2023   K 3.9 05/29/2023   CL 104 05/29/2023   CO2 21 (L) 05/29/2023   BUN 19 05/29/2023   CREATININE 1.48 (H) 05/29/2023   GLUCOSE 115 (H) 05/29/2023   GFRNONAA 57 (L) 05/29/2023   GFRAA 52 (L) 10/03/2018   CALCIUM 8.2 (L) 05/29/2023   PHOS 3.7 05/14/2015   PROT 5.5 (L) 05/29/2023   ALBUMIN 1.9 (L) 05/29/2023   BILITOT 0.7 05/29/2023   ALKPHOS 84 05/29/2023   AST 26 05/29/2023   ALT 11 05/29/2023   ANIONGAP  12 05/29/2023    GFR: Estimated Creatinine Clearance: 55.8 mL/min (A) (by C-G formula based on SCr of 1.48 mg/dL (H)).  Recent Results (from the past 240 hour(s))  SARS Coronavirus 2 by RT PCR (hospital order, performed in [facility] hospital lab) *cepheid single result  test* Anterior Nasal Swab     Status: None   Collection Time: 05/25/23  7:08 PM   Specimen: Anterior Nasal Swab  Result Value Ref Range Status   SARS Coronavirus 2 by RT PCR NEGATIVE NEGATIVE Final    Comment: Performed at [facility], [address] [place], [place] [address]  Culture, blood (Routine X 2) w Reflex to ID Panel     Status: None (Preliminary result)   Collection Time: 05/25/23  8:32 PM   Specimen: BLOOD  Result Value Ref Range Status   Specimen Description BLOOD BLOOD RIGHT ARM  Final   Special Requests   Final    BOTTLES DRAWN AEROBIC AND ANAEROBIC Blood Culture adequate volume   Culture   Final    NO GROWTH 3 DAYS Performed at [facility], [address], [place], [place] [address]    Report Status PENDING  Incomplete  Culture, blood (Routine X 2) w Reflex to ID Panel     Status: None (Preliminary result)   Collection Time: 05/25/23  8:32 PM   Specimen: BLOOD  Result Value Ref Range Status   Specimen Description BLOOD BLOOD RIGHT ARM  Final   Special Requests   Final    BOTTLES DRAWN AEROBIC AND ANAEROBIC Blood Culture adequate volume   Culture   Final    NO GROWTH 3 DAYS Performed at [facility], [address], [place], [place] [address]    Report Status PENDING  Incomplete      Radiology Studies: No results found.    LOS: 4 days    Jacquelin Hawking, MD [facility] 05/29/2023, 5:15 PM   If 7PM-7AM, please contact night-coverage [email or web address]

## 2023-05-29 NOTE — Evaluation (Signed)
Physical Therapy Evaluation Patient Details Name: Craig Schmidt MRN: [id number] DOB: Mar 21, 1971 Today's Date: 05/29/2023  History of Present Illness  Pt is a 52 y/o M presenting to ED on 11/2 with new onset chest pain, SOB, worsening RUE pain and speech difficulty. CTH with acute L frontoparietal infarction. MRI shows 2 possible tiny acute cortical infarcts in bil frontal lobes, possibly artifactual per neuro 11/3. PMH includes CAD, CHF, prior CVA with residual R weakness, polysubstance abuse, homelessness.  Clinical Impression  Pt presents to PT with deficits in functional mobility, gait, balance, endurance, power. Pt with acute on chronic R sided weakness at this time. Pt typically ambulates with a rollator but has a history of multiple falls. Pt reports reduced activity tolerance at this time due to low back pain and dyspnea, compared to baseline. Pt will benefit from short term inpatient PT services in an effort to improve activity tolerance and reduce falls risk. Pt will need to be able to mobilize for community distances and on uneven surfaces due to lack of consistent housing.        If plan is discharge home, recommend the following: A lot of help with bathing/dressing/bathroom;Assistance with cooking/housework;Assist for transportation;Help with stairs or ramp for entrance   Can travel by private vehicle   Yes    Equipment Recommendations Rollator (4 wheels)  Recommendations for Other Services       Functional Status Assessment Patient has had a recent decline in their functional status and demonstrates the ability to make significant improvements in function in a reasonable and predictable amount of time.     Precautions / Restrictions Precautions Precautions: Fall Precaution Comments: R shoulder subluxation Restrictions Weight Bearing Restrictions: No      Mobility  Bed Mobility Overal bed mobility: Needs Assistance Bed Mobility: Supine to Sit     Supine to sit:  Min assist          Transfers Overall transfer level: Needs assistance Equipment used: Rollator (4 wheels) Transfers: Sit to/from Stand Sit to Stand: Contact guard assist                Ambulation/Gait Ambulation/Gait assistance: Contact guard assist Gait Distance (Feet): 80 Feet Assistive device: Rollator (4 wheels) Gait Pattern/deviations: Step-to pattern, Decreased dorsiflexion - right Gait velocity: reduced Gait velocity interpretation: <1.31 ft/sec, indicative of household ambulator   General Gait Details: pt with slowed step-to gair,  Stairs Stairs:  (pt simulates his strategy for negotiating a curb, although does not step up or down physical curb during session)          Wheelchair Mobility     Tilt Bed    Modified Rankin (Stroke Patients Only)       Balance Overall balance assessment: Needs assistance Sitting-balance support: No upper extremity supported, Feet supported Sitting balance-Leahy Scale: Good     Standing balance support: Single extremity supported, Reliant on assistive device for balance Standing balance-Leahy Scale: Poor                               Pertinent Vitals/Pain Pain Assessment Pain Assessment: Faces Faces Pain Scale: Hurts little more Pain Location: low back Pain Descriptors / Indicators: Aching Pain Intervention(s): Monitored during session    Home Living Family/patient expects to be discharged to:: Shelter/Homeless                   Additional Comments: reports living in the streets  Prior Function Prior Level of Function : Needs assist             Mobility Comments: rollator for mobility ADLs Comments: reports having assist from girlfrined for dressing tasks     Extremity/Trunk Assessment   Upper Extremity Assessment Upper Extremity Assessment: RUE deficits/detail RUE Deficits / Details: R shoulder subluxation, can flex shoulder to ~45*, elbow ROM, noted digit AROM limited in  digits 4 and 5, PROM WFL RUE Sensation: decreased light touch RUE Coordination: decreased fine motor;decreased gross motor    Lower Extremity Assessment Lower Extremity Assessment: Defer to PT evaluation RLE Deficits / Details: grossly 4-/5    Cervical / Trunk Assessment Cervical / Trunk Assessment: Kyphotic  Communication   Communication Communication: No apparent difficulties Cueing Techniques: Verbal cues  Cognition Arousal: Alert Behavior During Therapy: WFL for tasks assessed/performed Overall Cognitive Status: Within Functional Limits for tasks assessed                                          General Comments General comments (skin integrity, edema, etc.): VSS, HR 100bpm after mobility    Exercises     Assessment/Plan    PT Assessment Patient needs continued PT services  PT Problem List Decreased strength;Decreased balance;Decreased activity tolerance;Decreased mobility;Decreased knowledge of use of DME;Pain       PT Treatment Interventions DME instruction;Gait training;Stair training;Functional mobility training;Therapeutic activities;Balance training;Neuromuscular re-education;Patient/family education;Therapeutic exercise    PT Goals (Current goals can be found in the Care Plan section)  Acute Rehab PT Goals Patient Stated Goal: to reduce low back pain, improve activity tolerance PT Goal Formulation: With patient Time For Goal Achievement: 06/12/23 Potential to Achieve Goals: Fair    Frequency Min 1X/week     Co-evaluation               AM-PAC PT "6 Clicks" Mobility  Outcome Measure Help needed turning from your back to your side while in a flat bed without using bedrails?: A Little Help needed moving from lying on your back to sitting on the side of a flat bed without using bedrails?: A Little Help needed moving to and from a bed to a chair (including a wheelchair)?: A Little Help needed standing up from a chair using your arms  (e.g., wheelchair or bedside chair)?: A Little Help needed to walk in hospital room?: A Little Help needed climbing 3-5 steps with a railing? : A Lot 6 Click Score: 17    End of Session Equipment Utilized During Treatment: Gait belt Activity Tolerance: Patient tolerated treatment well Patient left: in bed;with call bell/phone within reach (OT present) Nurse Communication: Mobility status PT Visit Diagnosis: Other abnormalities of gait and mobility (R26.89);Muscle weakness (generalized) (M62.81);Hemiplegia and hemiparesis Hemiplegia - Right/Left: Right Hemiplegia - dominant/non-dominant: Dominant Hemiplegia - caused by: Cerebral infarction (acute on chronic R sided weakness)    Time: 1610-9604 PT Time Calculation (min) (ACUTE ONLY): 22 min   Charges:   PT Evaluation $PT Eval Low Complexity: 1 Low   PT General Charges $$ ACUTE PT VISIT: 1 Visit         Arlyss Gandy, PT, DPT Acute Rehabilitation Office [phone]   Arlyss Gandy 05/29/2023, 3:08 PM

## 2023-05-29 NOTE — Progress Notes (Addendum)
Occupational Therapy Treatment Patient Details Name: Craig Schmidt MRN: [id number] DOB: 23-Jul-1971 Today's Date: 05/29/2023   History of present illness Pt is a 52 y/o M presenting to ED on 11/2 with new onset chest pain, SOB, worsening RUE pain and speech difficulty. CTH with acute L frontoparietal infarction. MRI shows 2 possible tiny acute cortical infarcts in bil frontal lobes, possibly artifactual per neuro 11/3. PMH includes CAD, CHF, prior CVA with residual R weakness, polysubstance abuse, homelessness.   OT comments  Pt progressing towards goals this session, completing seated ADLs with min-mod A, min A for bed mobility and min A for transfers with rollator. Pt with limited movement and RUE and noted subluxation in R shoulder, very painful per pt. Ordered R shoulder sling for comfort/when OOB and RN/MD notified. Plan for sling education in next session if delivered. Pt with limited grasp/ROM in RUE but uses to assist with ADLs. Pt presenting with impairments listed below, will follow acutely. Patient will benefit from continued inpatient follow up therapy, <3 hours/day to maximize safety/ind with ADLs/functional mobility.       If plan is discharge home, recommend the following:  Two people to help with walking and/or transfers;A lot of help with bathing/dressing/bathroom;Assistance with cooking/housework;Direct supervision/assist for medications management;Direct supervision/assist for financial management;Assist for transportation;Help with stairs or ramp for entrance;Supervision due to cognitive status   Equipment Recommendations  Other (comment) (defer)    Recommendations for Other Services PT consult    Precautions / Restrictions Precautions Precautions: Fall Precaution Comments: R shoulder subluxation Restrictions Weight Bearing Restrictions: No       Mobility Bed Mobility Overal bed mobility: Needs Assistance Bed Mobility: Supine to Sit     Supine to sit: Min  assist          Transfers Overall transfer level: Needs assistance Equipment used: 1 person hand held assist, Rollator (4 wheels) Transfers: Sit to/from Stand Sit to Stand: Min assist                 Balance Overall balance assessment: Needs assistance Sitting-balance support: Feet supported Sitting balance-Leahy Scale: Good     Standing balance support: During functional activity Standing balance-Leahy Scale: Poor                             ADL either performed or assessed with clinical judgement   ADL Overall ADL's : Needs assistance/impaired Eating/Feeding: Minimal assistance   Grooming: Oral care;Sitting;Moderate assistance Grooming Details (indicate cue type and reason): holding cup and putting toothpaste on                     Toileting- Clothing Manipulation and Hygiene: Moderate assistance Toileting - Clothing Manipulation Details (indicate cue type and reason): assisting with clothing mgmt and use of urinal     Functional mobility during ADLs: Minimal assistance      Extremity/Trunk Assessment Upper Extremity Assessment Upper Extremity Assessment: RUE deficits/detail RUE Deficits / Details: R shoulder subluxation, can flex shoulder to ~45*, elbow ROM, noted digit AROM limited in digits 4 and 5, PROM WFL RUE Sensation: decreased light touch RUE Coordination: decreased fine motor;decreased gross motor   Lower Extremity Assessment Lower Extremity Assessment: Defer to PT evaluation        Vision   Vision Assessment?: No apparent visual deficits   Perception Perception Perception: Not tested   Praxis Praxis Praxis: Not tested    Cognition Arousal: Alert Behavior During Therapy:  WFL for tasks assessed/performed, Flat affect Overall Cognitive Status: Impaired/Different from baseline                                          Exercises      Shoulder Instructions       General Comments VSS, HR 100bpm  after mobility    Pertinent Vitals/ Pain       Pain Assessment Pain Assessment: Faces Pain Score: 4  Faces Pain Scale: Hurts little more Pain Location: R side with coughing Pain Descriptors / Indicators: Discomfort Pain Intervention(s): Limited activity within patient's tolerance, Monitored during session, Repositioned  Home Living                                          Prior Functioning/Environment              Frequency  Min 1X/week        Progress Toward Goals  OT Goals(current goals can now be found in the care plan section)  Progress towards OT goals: Progressing toward goals  Acute Rehab OT Goals Patient Stated Goal: none stated OT Goal Formulation: With patient Time For Goal Achievement: 06/10/23 Potential to Achieve Goals: Good ADL Goals Pt Will Perform Upper Body Dressing: with min assist;sitting Pt Will Perform Lower Body Dressing: with min assist;sitting/lateral leans;sit to/from stand Pt Will Transfer to Toilet: with min assist;bedside commode;squat pivot transfer;stand pivot transfer Pt/caregiver will Perform Home Exercise Program: With written HEP provided;Right Upper extremity;With Supervision  Plan      Co-evaluation                 AM-PAC OT "6 Clicks" Daily Activity     Outcome Measure   Help from another person eating meals?: A Little Help from another person taking care of personal grooming?: A Little Help from another person toileting, which includes using toliet, bedpan, or urinal?: A Lot Help from another person bathing (including washing, rinsing, drying)?: A Lot Help from another person to put on and taking off regular upper body clothing?: A Lot Help from another person to put on and taking off regular lower body clothing?: A Lot 6 Click Score: 14    End of Session Equipment Utilized During Treatment: Gait belt  OT Visit Diagnosis: Unsteadiness on feet (R26.81);Other abnormalities of gait and mobility  (R26.89);Muscle weakness (generalized) (M62.81)   Activity Tolerance Patient tolerated treatment well   Patient Left in bed;with call bell/phone within reach;with bed alarm set;with family/visitor present   Nurse Communication Mobility status        Time: 1610-9604 OT Time Calculation (min): 24 min  Charges: OT General Charges $OT Visit: 1 Visit OT Treatments $Self Care/Home Management : 8-22 mins  Carver Fila, OTD, OTR/L SecureChat Preferred Acute Rehab [phone]   Carver Fila Koonce 05/29/2023, 2:33 PM

## 2023-05-29 NOTE — Hospital Course (Signed)
Craig Schmidt is a 52 y.o. male with a history of homelessness, polysubstance abuse, medical non-adherence, hypertension, hyperlipidemia, stroke, CAD/STEMI, cardiomyopathy, chronic systolic heart failure, CKD, brain tumor.  Patient presented secondary to chest pain with evidence of pneumonia. Antibiotics started for treatment.

## 2023-05-29 NOTE — NC FL2 (Addendum)
[place] MEDICAID FL2 LEVEL OF CARE FORM     IDENTIFICATION  Patient Name: Craig Schmidt Birthdate: 06-Nov-1970 Sex: male Admission Date (Current Location): 05/25/2023  [facility] and [place] Number:  Producer, television/film/video and Address:  [facility], [address] [place], [place] [address]      Provider Number: [id number]  Attending Physician Name and Address:  Narda Bonds, MD  Relative Name and Phone Number:       Current Level of Care: Hospital Recommended Level of Care: Skilled Nursing Facility Prior Approval Number:    Date Approved/Denied:   PASRR Number:  [id number]  Discharge Plan: SNF    Current Diagnoses: Patient Active Problem List   Diagnosis Date Noted   Troponin I above reference range 05/25/2023   CAP (community acquired pneumonia) 05/25/2023   Pneumonia 05/25/2023   Acute on chronic combined systolic and diastolic heart failure (HCC) 12/03/2022   Malnutrition of moderate degree 11/30/2022   Acute on chronic combined systolic and diastolic CHF (congestive heart failure) (HCC) 11/29/2022   Acute on chronic combined systolic (congestive) and diastolic (congestive) heart failure (HCC) 11/28/2022   ACS (acute coronary syndrome) (HCC) 11/28/2022   Back pain 11/28/2022   Essential hypertension 11/28/2022   Dyslipidemia 11/28/2022   Noncompliance with medication regimen 11/28/2022   ST elevation myocardial infarction (STEMI) of inferolateral wall, subsequent episode of care (HCC) 12/19/2021   ST elevation myocardial infarction (STEMI) (HCC)    Ischemic cardiomyopathy    Cocaine abuse (HCC)    Smoker    Alcohol abuse    CVA (cerebral vascular accident) (HCC) 09/08/2021   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   Tear of MCL (medial collateral ligament) of knee 05/20/2015   Protein-calorie malnutrition, severe 05/17/2015   Lactic acidosis 05/15/2015   Left knee pain 05/15/2015   Lung nodules 05/15/2015    Hypoglycemia 05/14/2015   Hypothermia 05/14/2015   Acute encephalopathy 05/14/2015   Cocaine abuse with intoxication (HCC) 05/14/2015   Alcohol intoxication in active alcoholic (HCC) 05/14/2015   Sepsis (HCC) 05/14/2015   Homeless 05/14/2015    Orientation RESPIRATION BLADDER Height & Weight     Self, Place, Situation  Normal Continent Weight: 151 lb 14.4 oz (68.9 kg) Height:  5\' 7"  (170.2 cm)  BEHAVIORAL SYMPTOMS/MOOD NEUROLOGICAL BOWEL NUTRITION STATUS      Continent Diet (Heart)  AMBULATORY STATUS COMMUNICATION OF NEEDS Skin   Limited Assist Verbally Normal                       Personal Care Assistance Level of Assistance  Feeding, Bathing, Dressing Bathing Assistance: Maximum assistance Feeding assistance: Limited assistance Dressing Assistance: Maximum assistance     Functional Limitations Info  Speech     Speech Info: Impaired    SPECIAL CARE FACTORS FREQUENCY  PT (By licensed PT), OT (By licensed OT)     PT Frequency: 5x/week OT Frequency: 5x/week            Contractures      Additional Factors Info  Code Status, Allergies Code Status Info: Full Allergies Info: Shellfish Allergy, Peanut-containing Drug Products           Current Medications (05/29/2023):  This is the current hospital active medication list Current Facility-Administered Medications  Medication Dose Route Frequency Provider Last Rate Last Admin   acetaminophen (TYLENOL) tablet 650 mg  650 mg Oral Q6H PRN Nolberto Hanlon, MD   650 mg at 05/29/23 [phone]  Or   acetaminophen (TYLENOL) suppository 650 mg  650 mg Rectal Q6H PRN Nolberto Hanlon, MD       Ampicillin-Sulbactam (UNASYN) 3 g in sodium chloride 0.9 % 100 mL IVPB  3 g Intravenous Q8H Daylene Posey, RPH 200 mL/hr at 05/29/23 1141 3 g at 05/29/23 1141   aspirin EC tablet 81 mg  81 mg Oral Daily Dahal, Melina Schools, MD   81 mg at 05/29/23 1129   atorvastatin (LIPITOR) tablet 40 mg  40 mg Oral Daily Nolberto Hanlon, MD   40 mg at 05/29/23 1130    carvedilol (COREG) tablet 3.125 mg  3.125 mg Oral BID Lorin Glass, MD   3.125 mg at 05/29/23 1129   clopidogrel (PLAVIX) tablet 75 mg  75 mg Oral Daily Nolberto Hanlon, MD   75 mg at 05/29/23 1130   empagliflozin (JARDIANCE) tablet 10 mg  10 mg Oral Daily Nolberto Hanlon, MD   10 mg at 05/29/23 1130   enoxaparin (LOVENOX) injection 40 mg  40 mg Subcutaneous Q24H Nolberto Hanlon, MD   40 mg at 05/29/23 1129   guaiFENesin (MUCINEX) 12 hr tablet 600 mg  600 mg Oral BID Dahal, Melina Schools, MD   600 mg at 05/29/23 1127   ipratropium (ATROVENT) nebulizer solution 0.5 mg  0.5 mg Nebulization Q4H PRN Crosley, Debby, MD   0.5 mg at 05/26/23 0309   isosorbide-hydrALAZINE (BIDIL) 20-37.5 MG per tablet 0.5 tablet  0.5 tablet Oral TID Lorin Glass, MD   0.5 tablet at 05/29/23 1129   methocarbamol (ROBAXIN) tablet 750 mg  750 mg Oral Q6H PRN Narda Bonds, MD   750 mg at 05/29/23 1130   nitroGLYCERIN (NITROSTAT) SL tablet 0.4 mg  0.4 mg Sublingual Q5 min PRN Nolberto Hanlon, MD   0.4 mg at 05/25/23 1507   polyethylene glycol (MIRALAX / GLYCOLAX) packet 17 g  17 g Oral Daily PRN Nolberto Hanlon, MD       sodium chloride flush (NS) 0.9 % injection 3 mL  3 mL Intravenous Q12H Nolberto Hanlon, MD   3 mL at 05/28/23 0941     Discharge Medications: Please see discharge summary for a list of discharge medications.  Relevant Imaging Results:  Relevant Lab Results:   Additional Information SSN:[id number]  Antion Felipa Emory, Student-Social Work

## 2023-05-29 NOTE — TOC Progression Note (Addendum)
Transition of Care [facility]) - Progression Note    Patient Details  Name: Craig Schmidt MRN: [id number] Date of Birth: 08-May-1971  Transition of Care [facility]) CM/SW Contact  [name], RN Phone Number: 05/29/2023, 3:07 PM  Clinical Narrative:     Current recommendations are for SNF. Pt agreeable. CM has updated CSW.  1530: per [employer] pt received a wheelchair in January and can not get a rollator at this time. Pt states [facility] has been working to get him a Personnel officer. CM has called and left [employer] a voicemail.    Expected Discharge Plan: Skilled Nursing Facility Barriers to Discharge: Continued Medical Work up  Expected Discharge Plan and Services   Discharge Planning Services: CM Consult   Living arrangements for the past 2 months: Homeless                                       Social Determinants of Health (SDOH) Interventions SDOH Screenings   Food Insecurity: Food Insecurity Present (05/25/2023)  Housing: High Risk (05/25/2023)  Transportation Needs: Unmet Transportation Needs (05/25/2023)  Utilities: At Risk (05/25/2023)  Alcohol Screen: High Risk (09/19/2021)  Depression (PHQ2-9): High Risk (06/13/2022)  Financial Resource Strain: High Risk (09/07/2022)  Stress: Stress Concern Present (10/16/2022)  Tobacco Use: High Risk (05/29/2023)    Readmission Risk Interventions     No data to display

## 2023-05-29 NOTE — Plan of Care (Signed)
  Problem: Education: Goal: Knowledge of disease or condition will improve Outcome: Progressing Goal: Knowledge of secondary prevention will improve (MUST DOCUMENT ALL) Outcome: Progressing Goal: Knowledge of patient specific risk factors will improve Loraine Leriche N/A or DELETE if not current risk factor) Outcome: Progressing   Problem: Ischemic Stroke/TIA Tissue Perfusion: Goal: Complications of ischemic stroke/TIA will be minimized Outcome: Progressing   Problem: Coping: Goal: Will verbalize positive feelings about self Outcome: Progressing Goal: Will identify appropriate support needs Outcome: Progressing   Problem: Health Behavior/Discharge Planning: Goal: Ability to manage health-related needs will improve Outcome: Progressing Goal: Goals will be collaboratively established with patient/family Outcome: Progressing   Problem: Self-Care: Goal: Ability to participate in self-care as condition permits will improve Outcome: Progressing Goal: Verbalization of feelings and concerns over difficulty with self-care will improve Outcome: Progressing Goal: Ability to communicate needs accurately will improve Outcome: Progressing   Problem: Nutrition: Goal: Risk of aspiration will decrease Outcome: Progressing Goal: Dietary intake will improve Outcome: Progressing   Problem: Education: Goal: Knowledge of General Education information will improve Description: Including pain rating scale, medication(s)/side effects and non-pharmacologic comfort measures Outcome: Progressing   Problem: Health Behavior/Discharge Planning: Goal: Ability to manage health-related needs will improve Outcome: Progressing   Problem: Clinical Measurements: Goal: Ability to maintain clinical measurements within normal limits will improve Outcome: Progressing Goal: Will remain free from infection Outcome: Progressing

## 2023-05-30 ENCOUNTER — Inpatient Hospital Stay (HOSPITAL_COMMUNITY): Payer: Medicaid Other

## 2023-05-30 DIAGNOSIS — M546 Pain in thoracic spine: Secondary | ICD-10-CM | POA: Diagnosis not present

## 2023-05-30 DIAGNOSIS — J189 Pneumonia, unspecified organism: Secondary | ICD-10-CM | POA: Diagnosis not present

## 2023-05-30 LAB — CBC
HCT: 29 % — ABNORMAL LOW (ref 39.0–52.0)
Hemoglobin: 9.5 g/dL — ABNORMAL LOW (ref 13.0–17.0)
MCH: 30.7 pg (ref 26.0–34.0)
MCHC: 32.8 g/dL (ref 30.0–36.0)
MCV: 93.9 fL (ref 80.0–100.0)
Platelets: 330 10*3/uL (ref 150–400)
RBC: 3.09 MIL/uL — ABNORMAL LOW (ref 4.22–5.81)
RDW: 14.2 % (ref 11.5–15.5)
WBC: 9.9 10*3/uL (ref 4.0–10.5)
nRBC: 0.2 % (ref 0.0–0.2)

## 2023-05-30 MED ORDER — HYDROXYZINE HCL 25 MG PO TABS
25.0000 mg | ORAL_TABLET | Freq: Four times a day (QID) | ORAL | Status: DC | PRN
  Administered 2023-05-30: 25 mg via ORAL
  Filled 2023-05-30 (×2): qty 1

## 2023-05-30 MED ORDER — MELATONIN 5 MG PO TABS
5.0000 mg | ORAL_TABLET | Freq: Every day | ORAL | Status: DC
  Administered 2023-05-30: 5 mg via ORAL
  Filled 2023-05-30: qty 1

## 2023-05-30 MED ORDER — DICLOFENAC SODIUM 1 % EX GEL
4.0000 g | Freq: Four times a day (QID) | CUTANEOUS | Status: DC
  Administered 2023-05-30: 4 g via TOPICAL
  Filled 2023-05-30: qty 100

## 2023-05-30 NOTE — Progress Notes (Signed)
Occupational Therapy Treatment Patient Details Name: Craig Schmidt MRN: [id number] DOB: 1970/10/10 Today's Date: 05/30/2023   History of present illness Pt is a 52 y/o M presenting to ED on 11/2 with new onset chest pain, SOB, worsening RUE pain and speech difficulty. CTH with acute L frontoparietal infarction. MRI shows 2 possible tiny acute cortical infarcts in bil frontal lobes, possibly artifactual per neuro 11/3. PMH includes CAD, CHF, prior CVA with residual R weakness, polysubstance abuse, homelessness.   OT comments  Pt is making incremental progress towards their acute OT goals. He required maximal encouragement to participate but overall completed bed mobility and simple transfers without physical assist, superivsion- GCA for safety only. R sling donned and adjusted while sitting EOB, pt reported more comfort with sling when sitting upright. Pt verbalized understanding that sling can be work any time he is OOB for joint protection. Pt asked therapist to feed him; maximal encouragement and min A physical assist needed for self feeding task with use of LUE - plan to try build up foam for feeding and grooming tasks next session. OT to continue to follow acutely to facilitate progress towards established goals. Pt will continue to benefit from skilled inpatient follow up therapy, <3 hours/day.       If plan is discharge home, recommend the following:  Two people to help with walking and/or transfers;A lot of help with bathing/dressing/bathroom;Assistance with cooking/housework;Direct supervision/assist for medications management;Direct supervision/assist for financial management;Assist for transportation;Help with stairs or ramp for entrance;Supervision due to cognitive status   Equipment Recommendations  Other (comment)       Precautions / Restrictions Precautions Precautions: Fall Precaution Comments: R shoulder subluxation Required Braces or Orthoses: Sling (for comfort when  OOB) Restrictions Weight Bearing Restrictions: No       Mobility Bed Mobility Overal bed mobility: Needs Assistance Bed Mobility: Supine to Sit, Sit to Supine     Supine to sit: Supervision Sit to supine: Supervision        Transfers Overall transfer level: Needs assistance Equipment used: 1 person hand held assist Transfers: Sit to/from Stand Sit to Stand: Contact guard assist           General transfer comment: declined further mobility     Balance Overall balance assessment: Needs assistance Sitting-balance support: Feet supported Sitting balance-Leahy Scale: Good     Standing balance support: Single extremity supported, During functional activity Standing balance-Leahy Scale: Fair Standing balance comment: statically             ADL either performed or assessed with clinical judgement   ADL Overall ADL's : Needs assistance/impaired Eating/Feeding: Minimal assistance Eating/Feeding Details (indicate cue type and reason): significant verbal cues, pt askign therapist to complete self feeding task for him. min A needed with use of LUE. pt would benefit from built up foam                 Functional mobility during ADLs: Contact guard assist General ADL Comments: pt refused most ADLs - donned sling for R shoulder protection, pt reports increased comfort with sling donned when sitting upright.    Extremity/Trunk Assessment Upper Extremity Assessment Upper Extremity Assessment: RUE deficits/detail RUE Deficits / Details: R shoulder subluxation, can flex shoulder to ~45*, elbow ROM, noted digit AROM limited in digits 4 and 5, PROM WFL RUE Sensation: decreased light touch RUE Coordination: decreased fine motor;decreased gross motor   Lower Extremity Assessment Lower Extremity Assessment: Defer to PT evaluation  Vision   Vision Assessment?: No apparent visual deficits   Perception Perception Perception: Not tested   Praxis Praxis Praxis:  Not tested    Cognition Arousal: Alert Behavior During Therapy: WFL for tasks assessed/performed, Flat affect Overall Cognitive Status: Impaired/Different from baseline Area of Impairment: Safety/judgement, Following commands, Problem solving             Following Commands: Follows one step commands consistently, Follows multi-step commands inconsistently Safety/Judgement: Decreased awareness of safety, Decreased awareness of deficits   Problem Solving: Slow processing, Decreased initiation, Requires verbal cues General Comments: required encouragement to participate, needed significant cues for problem solving self feeding task with LUE              General Comments VSS, visitor in room    Pertinent Vitals/ Pain       Pain Assessment Pain Assessment: Faces Faces Pain Scale: Hurts a little bit Pain Location: back Pain Descriptors / Indicators: Aching, Discomfort, Grimacing, Guarding, Moaning Pain Intervention(s): Limited activity within patient's tolerance, Monitored during session   Frequency  Min 1X/week        Progress Toward Goals  OT Goals(current goals can now be found in the care plan section)  Progress towards OT goals: Progressing toward goals  Acute Rehab OT Goals Patient Stated Goal: to get rest OT Goal Formulation: With patient Time For Goal Achievement: 06/10/23 Potential to Achieve Goals: Good ADL Goals Pt Will Perform Upper Body Dressing: with min assist;sitting Pt Will Perform Lower Body Dressing: with min assist;sitting/lateral leans;sit to/from stand Pt Will Transfer to Toilet: with min assist;bedside commode;squat pivot transfer;stand pivot transfer Pt/caregiver will Perform Home Exercise Program: With written HEP provided;Right Upper extremity;With Supervision   AM-PAC OT "6 Clicks" Daily Activity     Outcome Measure   Help from another person eating meals?: A Little Help from another person taking care of personal grooming?: A  Little Help from another person toileting, which includes using toliet, bedpan, or urinal?: A Lot Help from another person bathing (including washing, rinsing, drying)?: A Lot Help from another person to put on and taking off regular upper body clothing?: A Lot Help from another person to put on and taking off regular lower body clothing?: A Lot 6 Click Score: 14    End of Session    OT Visit Diagnosis: Unsteadiness on feet (R26.81);Other abnormalities of gait and mobility (R26.89);Muscle weakness (generalized) (M62.81)   Activity Tolerance Patient tolerated treatment well   Patient Left in bed;with call bell/phone within reach;with bed alarm set   Nurse Communication Mobility status        Time: 1440-1500 OT Time Calculation (min): 20 min  Charges: OT General Charges $OT Visit: 1 Visit OT Treatments $Self Care/Home Management : 8-22 mins  Derenda Mis, OTR/L Acute Rehabilitation Services Office [phone] Secure Chat Communication Preferred   Donia Pounds 05/30/2023, 3:30 PM

## 2023-05-30 NOTE — Progress Notes (Signed)
PROGRESS NOTE    Craig Schmidt  [id number]:[id number] DOB: March 16, 1971 DOA: 05/25/2023 PCP: Rema Fendt, NP   Brief Narrative: Craig Schmidt is a 52 y.o. male with a history of homelessness, polysubstance abuse, medical non-adherence, hypertension, hyperlipidemia, stroke, CAD/STEMI, cardiomyopathy, chronic systolic heart failure, CKD, brain tumor.  Patient presented secondary to chest pain with evidence of pneumonia. Antibiotics started for treatment.   Assessment and Plan:  Multifocal pneumonia Patient with dyspnea, chest pain and productive cough with chest x-ray significant for right and left lower lobe consolidation with interstitial opacities. Patient started on Ceftriaxone and doxycycline, transitioned to Unasyn and doxycycline. Doxycycline course completed. -Continue Unasyn x7 days  History of stroke Noted. -Continue aspirin and Plavix  Right-sided back pain Consistent with musculoskeletal etiology as pain is reproducible. Complicated by underlying pneumonia diagnosis. Urinalysis obtained and is without hematuria. Some improvement with heat -Repeat chest x-ray -Continue Tylenol, heating pad, Robaxin -Add voltaren gel  CAD History of STEMI. Patient with troponin elevation this admission with peak troponin of 74 with delta of 59, not consistent with ACS. -Continue Aspirin, Lipitor and Plavix  Chronic systolic heart failure Stable. No evidence of acute failure. -Continue Jardiance and Coreg  Primary hypertension -Continue Coreg and Bidil  CKD stage II Baseline creatinine appears to be around 1.2-1.4. Stable.   DVT prophylaxis: Lovenox Code Status:   Code Status: Full Code Family Communication: Wife at bedside Disposition Plan: Discharge to SNF once bed is available   Consultants:  None  Procedures:  None  Antimicrobials: Ceftriaxone Doxycycline Unasyn    Subjective: Continued right sided back/flank pain. Pain worsened with cough. No significant  sputum production with coughing.  Objective: BP (!) 130/91 (BP Location: Right Arm)   Pulse (!) 103   Temp 98 F (36.7 C) (Oral)   Resp 17   Ht 5\' 7"  (1.702 m)   Wt 68.9 kg   SpO2 100%   BMI 23.79 kg/m   Examination:  General exam: Appears calm and comfortable Respiratory system: Diminished. Respiratory effort normal. Cardiovascular system: S1 & S2 heard, RRR. Gastrointestinal system: Abdomen is nondistended, soft and nontender.  Normal bowel sounds heard. Central nervous system: Alert and oriented. Psychiatry: Judgement and insight appear normal. Mood & affect appropriate.    Data Reviewed: I have personally reviewed following labs and imaging studies  CBC Lab Results  Component Value Date   WBC 10.3 05/29/2023   RBC 3.18 (L) 05/29/2023   HGB 9.7 (L) 05/29/2023   HCT 29.5 (L) 05/29/2023   MCV 92.8 05/29/2023   MCH 30.5 05/29/2023   PLT 276 05/29/2023   MCHC 32.9 05/29/2023   RDW 13.8 05/29/2023   LYMPHSABS 0.9 05/25/2023   MONOABS 0.6 05/25/2023   EOSABS 0.0 05/25/2023   BASOSABS 0.0 05/25/2023     Last metabolic panel Lab Results  Component Value Date   NA 137 05/29/2023   K 3.9 05/29/2023   CL 104 05/29/2023   CO2 21 (L) 05/29/2023   BUN 19 05/29/2023   CREATININE 1.48 (H) 05/29/2023   GLUCOSE 115 (H) 05/29/2023   GFRNONAA 57 (L) 05/29/2023   GFRAA 52 (L) 10/03/2018   CALCIUM 8.2 (L) 05/29/2023   PHOS 3.7 05/14/2015   PROT 5.5 (L) 05/29/2023   ALBUMIN 1.9 (L) 05/29/2023   BILITOT 0.7 05/29/2023   ALKPHOS 84 05/29/2023   AST 26 05/29/2023   ALT 11 05/29/2023   ANIONGAP 12 05/29/2023    GFR: Estimated Creatinine Clearance: 55.8 mL/min (A) (by C-G formula  based on SCr of 1.48 mg/dL (H)).  Recent Results (from the past 240 hour(s))  SARS Coronavirus 2 by RT PCR (hospital order, performed in [facility] hospital lab) *cepheid single result test* Anterior Nasal Swab     Status: None   Collection Time: 05/25/23  7:08 PM   Specimen: Anterior Nasal  Swab  Result Value Ref Range Status   SARS Coronavirus 2 by RT PCR NEGATIVE NEGATIVE Final    Comment: Performed at [facility], [address], [place], [place] [address]  Culture, blood (Routine X 2) w Reflex to ID Panel     Status: None (Preliminary result)   Collection Time: 05/25/23  8:32 PM   Specimen: BLOOD  Result Value Ref Range Status   Specimen Description BLOOD BLOOD RIGHT ARM  Final   Special Requests   Final    BOTTLES DRAWN AEROBIC AND ANAEROBIC Blood Culture adequate volume   Culture   Final    NO GROWTH 4 DAYS Performed at [facility], [address] [place], [place] [address]    Report Status PENDING  Incomplete  Culture, blood (Routine X 2) w Reflex to ID Panel     Status: None (Preliminary result)   Collection Time: 05/25/23  8:32 PM   Specimen: BLOOD  Result Value Ref Range Status   Specimen Description BLOOD BLOOD RIGHT ARM  Final   Special Requests   Final    BOTTLES DRAWN AEROBIC AND ANAEROBIC Blood Culture adequate volume   Culture   Final    NO GROWTH 4 DAYS Performed at [facility], [address] [place], [place] [address]    Report Status PENDING  Incomplete      Radiology Studies: No results found.    LOS: 5 days    Jacquelin Hawking, MD [facility] 05/30/2023, 11:18 AM   If 7PM-7AM, please contact night-coverage [email or web address]

## 2023-05-30 NOTE — Plan of Care (Signed)
  Problem: Education: Goal: Knowledge of disease or condition will improve Outcome: Progressing Goal: Knowledge of secondary prevention will improve (MUST DOCUMENT ALL) Outcome: Progressing Goal: Knowledge of patient specific risk factors will improve Loraine Leriche N/A or DELETE if not current risk factor) Outcome: Progressing   Problem: Ischemic Stroke/TIA Tissue Perfusion: Goal: Complications of ischemic stroke/TIA will be minimized Outcome: Progressing   Problem: Coping: Goal: Will verbalize positive feelings about self Outcome: Progressing Goal: Will identify appropriate support needs Outcome: Progressing   Problem: Health Behavior/Discharge Planning: Goal: Ability to manage health-related needs will improve Outcome: Progressing Goal: Goals will be collaboratively established with patient/family Outcome: Progressing   Problem: Self-Care: Goal: Ability to participate in self-care as condition permits will improve Outcome: Progressing Goal: Verbalization of feelings and concerns over difficulty with self-care will improve Outcome: Progressing Goal: Ability to communicate needs accurately will improve Outcome: Progressing   Problem: Nutrition: Goal: Risk of aspiration will decrease Outcome: Progressing   Problem: Education: Goal: Knowledge of General Education information will improve Description: Including pain rating scale, medication(s)/side effects and non-pharmacologic comfort measures Outcome: Progressing   Problem: Health Behavior/Discharge Planning: Goal: Ability to manage health-related needs will improve Outcome: Progressing   Problem: Clinical Measurements: Goal: Ability to maintain clinical measurements within normal limits will improve Outcome: Progressing Goal: Will remain free from infection Outcome: Progressing Goal: Diagnostic test results will improve Outcome: Progressing Goal: Respiratory complications will improve Outcome: Progressing Goal:  Cardiovascular complication will be avoided Outcome: Progressing   Problem: Activity: Goal: Risk for activity intolerance will decrease Outcome: Progressing   Problem: Nutrition: Goal: Adequate nutrition will be maintained Outcome: Progressing   Problem: Coping: Goal: Level of anxiety will decrease Outcome: Progressing   Problem: Elimination: Goal: Will not experience complications related to bowel motility Outcome: Progressing Goal: Will not experience complications related to urinary retention Outcome: Progressing   Problem: Pain Management: Goal: General experience of comfort will improve Outcome: Progressing   Problem: Safety: Goal: Ability to remain free from injury will improve Outcome: Progressing   Problem: Skin Integrity: Goal: Risk for impaired skin integrity will decrease Outcome: Progressing

## 2023-05-30 NOTE — Plan of Care (Signed)
  Problem: Ischemic Stroke/TIA Tissue Perfusion: Goal: Complications of ischemic stroke/TIA will be minimized Outcome: Progressing   Problem: Coping: Goal: Will identify appropriate support needs Outcome: Progressing   Problem: Self-Care: Goal: Ability to communicate needs accurately will improve Outcome: Progressing   Problem: Nutrition: Goal: Risk of aspiration will decrease Outcome: Progressing Goal: Dietary intake will improve Outcome: Progressing   Problem: Clinical Measurements: Goal: Respiratory complications will improve Outcome: Progressing Goal: Cardiovascular complication will be avoided Outcome: Progressing

## 2023-05-31 ENCOUNTER — Other Ambulatory Visit (HOSPITAL_COMMUNITY): Payer: Self-pay

## 2023-05-31 DIAGNOSIS — J189 Pneumonia, unspecified organism: Secondary | ICD-10-CM | POA: Diagnosis not present

## 2023-05-31 LAB — CULTURE, BLOOD (ROUTINE X 2)
Culture: NO GROWTH
Culture: NO GROWTH
Special Requests: ADEQUATE
Special Requests: ADEQUATE

## 2023-05-31 LAB — BASIC METABOLIC PANEL
Anion gap: 10 (ref 5–15)
BUN: 13 mg/dL (ref 6–20)
CO2: 20 mmol/L — ABNORMAL LOW (ref 22–32)
Calcium: 8.1 mg/dL — ABNORMAL LOW (ref 8.9–10.3)
Chloride: 107 mmol/L (ref 98–111)
Creatinine, Ser: 1.32 mg/dL — ABNORMAL HIGH (ref 0.61–1.24)
GFR, Estimated: >60 mL/min (ref >60)
Glucose, Bld: 103 mg/dL — ABNORMAL HIGH (ref 70–99)
Potassium: 3.6 mmol/L (ref 3.5–5.1)
Sodium: 137 mmol/L (ref 135–145)

## 2023-05-31 LAB — PROCALCITONIN: Procalcitonin: 0.46 ng/mL

## 2023-05-31 MED ORDER — DOXYCYCLINE HYCLATE 100 MG PO TABS
100.0000 mg | ORAL_TABLET | Freq: Two times a day (BID) | ORAL | 0 refills | Status: DC
  Filled 2023-05-31: qty 10, 5d supply, fill #0

## 2023-05-31 MED ORDER — DOXYCYCLINE HYCLATE 100 MG PO TABS
100.0000 mg | ORAL_TABLET | Freq: Two times a day (BID) | ORAL | Status: DC
  Administered 2023-05-31: 100 mg via ORAL
  Filled 2023-05-31: qty 1

## 2023-05-31 NOTE — Discharge Summary (Signed)
Physician Discharge Summary   Patient: Craig Schmidt MRN: [id number] DOB: 1971-07-10  Admit date:     05/25/2023  Discharge date: {dischdate:26783}  Discharge Physician: Jacquelin Hawking   PCP: Rema Fendt, NP   Recommendations at discharge:  {Tip this will not be part of the note when signed- Example include specific recommendations for outpatient follow-up, pending tests to follow-up on. (Optional):26781}  ***  Discharge Diagnoses: Principal Problem:   Pneumonia Active Problems:   CVA (cerebral vascular accident) (HCC)   Troponin I above reference range   CAP (community acquired pneumonia)  Resolved Problems:   * No resolved hospital problems. *  Hospital Course: Craig Schmidt is a 52 y.o. male with a history of homelessness, polysubstance abuse, medical non-adherence, hypertension, hyperlipidemia, stroke, CAD/STEMI, cardiomyopathy, chronic systolic heart failure, CKD, brain tumor.  Patient presented secondary to chest pain with evidence of pneumonia. Antibiotics started for treatment.  Assessment and Plan: CAP (community acquired pneumonia) Patient has poor dentition, presents with right subcostal severe pain with inspiration.  As well as coughing and productive sputum.  Chest x-ray showing right lower lobe infiltration.  Concern for pneumonia, possible aspiration pneumonia given patient history of polysubstance abuse.  Patient is s/p ceftriaxone and doxycycline.  I will check blood cultures, treat with Unasyn plus doxycycline.  Concern is if patient may have had pulmonary embolism given his typical pleuritic pain.  No fever no white count.  However patient needs apixaban already for another indication of his chronic strokes, I do not think patient would benefit from a CAT scan with contrast given his chronic kidney disease.  I will do a lower extremity Doppler and suffice it at that.  Troponin I above reference range This is likely due to patient's pneumonia as well as  chronic kidney disease.  Some of it is also chronic based on review of prior lab results.  We will trend this and continue to monitor clinically.  I do not think this is concerning for acute coronary syndrome given all the overall patient presentation.  CVA (cerebral vascular accident) [facility]) Patient has previously documented several chronic strokes, patient has been having garbled speech for the last 2 3 days, that is concerning for recrudescence of patient's prior cerebral infarction symptoms.  However CAT scan is showing acute left frontoparietal infarction this has been discussed with the neurology service by the ER attending.  At this time plan would be to proceed with doing an MRI to further try to determine the acuity of this infarction.  Further workup will be done accordingly.  I will order a swallow screen in the interim.  Patient is supposed to be on chronic apixaban for his prior embolic strokes.  However has been poorly catheter into this regimen.  Once the acute MRI is negative for acute large infarction, I plan to restart patient's apixaban.      {Tip this will not be part of the note when signed Body mass index is 23.79 kg/m. , ,  (Optional):26781}  {(NOTE) Pain control PDMP Statment (Optional):26782} Consultants: *** Procedures performed: ***  Disposition: {Plan; Disposition:26390} Diet recommendation:  {Diet_Plan:26776} DISCHARGE MEDICATION: Allergies as of 05/31/2023       Reactions   Shellfish Allergy Anaphylaxis   Peanut-containing Drug Products Itching     Med Rec must be completed prior to using this SMARTLINK***        Durable Medical Equipment  (From admission, onward)           Start  Ordered   05/29/23 1454  For home use only DME 4 wheeled rolling walker with seat  Once       Question:  Patient needs a walker to treat with the following condition  Answer:  Weakness   05/29/23 1454            Follow-up Information     [facility]  Transportation Follow up.   Why: please arrange transportation 2-3 days in advance. Contact information: [phone]               Discharge Exam: [name] Weights   05/25/23 1328  Weight: 68.9 kg   ***  Condition at discharge: {DC Condition:26389}  The results of significant diagnostics from this hospitalization (including imaging, microbiology, ancillary and laboratory) are listed below for reference.   Imaging Studies: CT CHEST WO CONTRAST  Result Date: 05/30/2023 CLINICAL DATA:  Pneumonia, complication suspected, xray done Respiratory illness, nondiagnostic xray EXAM: CT CHEST WITHOUT CONTRAST TECHNIQUE: Multidetector CT imaging of the chest was performed following the standard protocol without IV contrast. RADIATION DOSE REDUCTION: This exam was performed according to the departmental dose-optimization program which includes automated exposure control, adjustment of the mA and/or kV according to patient size and/or use of iterative reconstruction technique. COMPARISON:  Radiograph earlier today. FINDINGS: Cardiovascular: Prominent cardiomegaly. No pericardial effusion. The thoracic aorta is normal in caliber. Mediastinum/Nodes: Shotty mediastinal lymph nodes, including 10 mm short axis anterior paratracheal node series 3, image 21. Prevascular node measures 11 mm series 3, image 23. Hilar assessment is limited in the absence of IV contrast. No esophageal wall thickening. Lungs/Pleura: Multifocal bilateral lower lobe consolidations, peripheral with ground-glass and consolidative components. Small air bronchograms in the anterior right lower lobe consolidation. Bandlike areas of subsegmental atelectasis or scarring within the anterior right middle lobe. Heterogeneous pulmonary parenchyma with mild apical emphysema. There are small bilateral pleural effusions, partially loculated on the left. Upper Abdomen: Hepatic steatosis. No acute findings in the included upper abdomen.  Musculoskeletal: There is generalized body wall edema. Scoliosis and degenerative change in the spine. There are no acute or suspicious osseous abnormalities. IMPRESSION: 1. Multifocal bilateral lower lobe consolidations, peripheral with ground-glass and consolidative components. Findings are suspicious for multifocal pneumonia. Appearance can be seen in the setting of septic emboli. Recommend radiologic follow-up to resolution. 2. Small bilateral pleural effusions, partially loculated on the left. 3. Prominent cardiomegaly. 4. Shotty mediastinal lymph nodes, likely reactive. 5. Hepatic steatosis. 6. Generalized body wall edema. Emphysema (ICD10-J43.9). Electronically Signed   By: Narda Rutherford M.D.   On: 05/30/2023 22:36   DG Chest 2 View  Result Date: 05/30/2023 CLINICAL DATA:  Pneumonia EXAM: CHEST - 2 VIEW COMPARISON:  05/25/2023 FINDINGS: Frontal and lateral views of the chest demonstrates stable enlargement of the cardiac silhouette. Persistent consolidation at the lung bases. Stable diffuse interstitial prominence. No effusion or pneumothorax. No acute bony abnormality. IMPRESSION: 1. Continued bibasilar airspace disease, consistent with given history of pneumonia. Given the persistence of the left lower lobe consolidation since 12/02/2022, follow-up CT may be useful as previously recommended. Electronically Signed   By: Sharlet Salina M.D.   On: 05/30/2023 14:35   VAS [place] LOWER EXTREMITY VENOUS (DVT)  Result Date: 05/27/2023  Lower Venous DVT Study Patient Name:  KERY CORTS  Date of Exam:   05/27/2023 Medical Rec #: [id number]           Accession #:    [id number] Date of Birth: 04-26-1971  Patient Gender: M Patient Age:   89 years Exam Location:  [facility] Procedure:      VAS [place] LOWER EXTREMITY VENOUS (DVT) Referring Phys: [facility] --------------------------------------------------------------------------------  Indications: Swelling, and Edema.  Limitations: Patient  involuntary movement. Comparison Study: Previous exam on 2.25.2023 Performing Technologist: Fernande Bras  Examination Guidelines: A complete evaluation includes B-mode imaging, spectral Doppler, color Doppler, and power Doppler as needed of all accessible portions of each vessel. Bilateral testing is considered an integral part of a complete examination. Limited examinations for reoccurring indications may be performed as noted. The reflux portion of the exam is performed with the patient in reverse Trendelenburg.  +---------+---------------+---------+-----------+----------+--------------+ RIGHT    CompressibilityPhasicitySpontaneityPropertiesThrombus Aging +---------+---------------+---------+-----------+----------+--------------+ CFV      Full           Yes      Yes                                 +---------+---------------+---------+-----------+----------+--------------+ SFJ      Full                                                        +---------+---------------+---------+-----------+----------+--------------+ FV Prox  Full                                                        +---------+---------------+---------+-----------+----------+--------------+ FV Mid   Full                                                        +---------+---------------+---------+-----------+----------+--------------+ FV DistalFull                                                        +---------+---------------+---------+-----------+----------+--------------+ PFV      Full                                                        +---------+---------------+---------+-----------+----------+--------------+ POP      Full           Yes      Yes                                 +---------+---------------+---------+-----------+----------+--------------+ PTV      Full                                                         +---------+---------------+---------+-----------+----------+--------------+  PERO     Full                                                        +---------+---------------+---------+-----------+----------+--------------+   +---------+---------------+---------+-----------+----------+--------------+ LEFT     CompressibilityPhasicitySpontaneityPropertiesThrombus Aging +---------+---------------+---------+-----------+----------+--------------+ CFV      Full           Yes      Yes                                 +---------+---------------+---------+-----------+----------+--------------+ SFJ      Full                                                        +---------+---------------+---------+-----------+----------+--------------+ FV Prox  Full                                                        +---------+---------------+---------+-----------+----------+--------------+ FV Mid   Full                                                        +---------+---------------+---------+-----------+----------+--------------+ FV DistalFull                                                        +---------+---------------+---------+-----------+----------+--------------+ PFV      Full                                                        +---------+---------------+---------+-----------+----------+--------------+ POP      Full           Yes      Yes                                 +---------+---------------+---------+-----------+----------+--------------+ PTV      Full                                                        +---------+---------------+---------+-----------+----------+--------------+ PERO     Full                                                        +---------+---------------+---------+-----------+----------+--------------+  Summary: BILATERAL: - No evidence of deep vein thrombosis seen in the lower extremities, bilaterally. -No evidence of  popliteal cyst, bilaterally.   *See table(s) above for measurements and observations. Electronically signed by Sherald Hess MD on 05/27/2023 at 3:05:22 PM.    Final    VAS US CAROTID  Result Date: 05/27/2023 Carotid Arterial Duplex Study Patient Name:  NADINE FUTTERMAN  Date of Exam:   05/27/2023 Medical Rec #: [id number]           Accession #:    [id number] Date of Birth: [birth year]            Patient Gender: M Patient Age:   61 years Exam [facility]  [facility] Procedure:      VAS US CAROTID Referring Phys: Lorin Glass --------------------------------------------------------------------------------  Indications:  CVA. Risk Factors: Prior MI. Limitations   Today's exam was limited due to Patient involuntary movement. Performing Technologist: Fernande Bras  Examination Guidelines: A complete evaluation includes B-mode imaging, spectral Doppler, color Doppler, and power Doppler as needed of all accessible portions of each vessel. Bilateral testing is considered an integral part of a complete examination. Limited examinations for reoccurring indications may be performed as noted.  Right Carotid Findings: +----------+--------+--------+--------+------------------+------------------+           PSV cm/sEDV cm/sStenosisPlaque DescriptionComments           +----------+--------+--------+--------+------------------+------------------+ CCA Prox  75      17                                                   +----------+--------+--------+--------+------------------+------------------+ CCA Distal67      30                                                   +----------+--------+--------+--------+------------------+------------------+ ICA Prox  61      21                                intimal thickening +----------+--------+--------+--------+------------------+------------------+ ICA Mid   56      28                                                    +----------+--------+--------+--------+------------------+------------------+ ICA Distal62      21                                                   +----------+--------+--------+--------+------------------+------------------+ ECA       79      19                                                   +----------+--------+--------+--------+------------------+------------------+ +----------+--------+-------+--------+-------------------+  PSV cm/sEDV cmsDescribeArm Pressure (mmHG) +----------+--------+-------+--------+-------------------+ Subclavian89      17                                 +----------+--------+-------+--------+-------------------+ +---------+--------+--+--------+--+ VertebralPSV cm/s50EDV cm/s19 +---------+--------+--+--------+--+  Left Carotid Findings: +----------+--------+--------+--------+------------------+------------------+           PSV cm/sEDV cm/sStenosisPlaque DescriptionComments           +----------+--------+--------+--------+------------------+------------------+ CCA Prox  90      31                                                   +----------+--------+--------+--------+------------------+------------------+ CCA Distal67      22                                                   +----------+--------+--------+--------+------------------+------------------+ ICA Prox  47      21                                intimal thickening +----------+--------+--------+--------+------------------+------------------+ ICA Mid   64      26                                                   +----------+--------+--------+--------+------------------+------------------+ ICA Distal69      34                                                   +----------+--------+--------+--------+------------------+------------------+ ECA       65      17                                                    +----------+--------+--------+--------+------------------+------------------+ +----------+--------+--------+--------+-------------------+           PSV cm/sEDV cm/sDescribeArm Pressure (mmHG) +----------+--------+--------+--------+-------------------+ [id number]      23                                  +----------+--------+--------+--------+-------------------+ +---------+--------+--+--------+--+ VertebralPSV cm/s59EDV cm/s27 +---------+--------+--+--------+--+   Summary: Right Carotid: The ECA appears <50% stenosed. The extracranial vessels were                near-normal with only minimal wall thickening or plaque. Left Carotid: The ECA appears <50% stenosed. The extracranial vessels were               near-normal with only minimal wall thickening or plaque. Vertebrals:  Bilateral vertebral arteries demonstrate antegrade flow. Subclavians: Normal flow hemodynamics were seen in bilateral subclavian              arteries. *See table(s) above for  measurements and observations.  Electronically signed by Sherald Hess MD on 05/27/2023 at 3:05:03 PM.    Final    ECHOCARDIOGRAM COMPLETE BUBBLE STUDY  Result Date: 05/27/2023    ECHOCARDIOGRAM REPORT   Patient Name:   CATHAL REITENBACH Date of Exam: 05/27/2023 Medical Rec #:  [id number]          Height:       67.0 in Accession #:    [id number]         Weight:       151.9 lb Date of Birth:  09-26-1970           BSA:          1.799 m Patient Age:    52 years           BP:           119/87 mmHg Patient Gender: M                  HR:           105 bpm. Exam Location:  Inpatient Procedure: 2D Echo, Cardiac Doppler, Color Doppler and Intracardiac            Opacification Agent Indications:    Stroke  History:        Patient has prior history of Echocardiogram examinations, most                 recent 11/29/2022. CHF, CAD and Previous Myocardial Infarction;                 Stroke.  Sonographer:    Darlys Gales Referring Phys: [id number] [facility]   Sonographer Comments: Patient had previous negative bubble study. IMPRESSIONS  1. Left ventricular ejection fraction, by estimation, is <20%. The left ventricle has severely decreased function. The left ventricle demonstrates global hypokinesis. The left ventricular internal cavity size was moderately dilated. Left ventricular diastolic parameters are consistent with Grade II diastolic dysfunction (pseudonormalization).  2. Right ventricular systolic function is at least moderately reduced. The right ventricular size is mildly enlarged.  3. Left atrial size is moderate to severely dilated.  4. The mitral valve is thickened with limited movement of the valve leaflets (cannot r/o rheumatic mitral valve stenosis). There is a mean gradient of consistent with severe mitral stenosis.  5. Tricuspid valve regurgitation is moderate to severe.  6. The aortic valve is normal in structure. Aortic valve regurgitation is not visualized. FINDINGS  Left Ventricle: Left ventricular ejection fraction, by estimation, is <20%. The left ventricle has severely decreased function. The left ventricle demonstrates global hypokinesis. Definity contrast agent was given IV to delineate the left ventricular endocardial borders. The left ventricular internal cavity size was moderately dilated. There is no left ventricular hypertrophy. Left ventricular diastolic parameters are consistent with Grade II diastolic dysfunction (pseudonormalization). Right Ventricle: The right ventricular size is mildly enlarged. No increase in right ventricular wall thickness. Right ventricular systolic function is moderately reduced. Left Atrium: Left atrial size was moderately dilated. Right Atrium: Right atrial size was normal in size. Pericardium: There is no evidence of pericardial effusion. Mitral Valve: The mitral valve is abnormal. Moderate mitral valve regurgitation. Moderate to severe mitral valve stenosis. MV peak gradient, 19.2 mmHg. The mean  mitral valve gradient is 10.7 mmHg. Tricuspid Valve: The tricuspid valve is normal in structure. Tricuspid valve regurgitation is moderate to severe. Aortic Valve: The aortic valve is normal in structure. Aortic valve regurgitation is not visualized. Aortic  valve mean gradient measures 4.0 mmHg. Aortic valve peak gradient measures 5.8 mmHg. Aortic valve area, by VTI measures 2.93 cm. Pulmonic Valve: The pulmonic valve was not well visualized. Pulmonic valve regurgitation is not visualized. Aorta: The aortic root is normal in size and structure. IAS/Shunts: No atrial level shunt detected by color flow Doppler.  LEFT VENTRICLE PLAX 2D LVIDd:         5.20 cm LVIDs:         4.90 cm LV PW:         1.00 cm LV IVS:        1.00 cm LVOT diam:     1.90 cm LV SV:         42 LV SV Index:   23 LVOT Area:     2.84 cm  RIGHT VENTRICLE RV S prime:     8.16 cm/s LEFT ATRIUM              Index        RIGHT ATRIUM           Index LA Vol (A2C):   109.0 ml 60.59 ml/m  RA Area:     20.50 cm LA Vol (A4C):   68.4 ml  38.02 ml/m  RA Volume:   58.70 ml  32.63 ml/m LA Biplane Vol: 91.0 ml  50.58 ml/m  AORTIC VALVE AV Area (Vmax):    2.39 cm AV Area (Vmean):   2.43 cm AV Area (VTI):     2.93 cm AV Vmax:           120.00 cm/s AV Vmean:          93.400 cm/s AV VTI:            0.143 m AV Peak Grad:      5.8 mmHg AV Mean Grad:      4.0 mmHg LVOT Vmax:         101.00 cm/s LVOT Vmean:        80.100 cm/s LVOT VTI:          0.148 m LVOT/AV VTI ratio: 1.03  AORTA Ao Root diam: 2.60 cm MITRAL VALVE                TRICUSPID VALVE MV Area (PHT): 2.24 cm     TR Peak grad:   51.3 mmHg MV Area VTI:   1.21 cm     TR Vmax:        358.00 cm/s MV Peak grad:  19.2 mmHg MV Mean grad:  10.7 mmHg    SHUNTS MV Vmax:       2.19 m/s     Systemic VTI:  0.15 m MV Vmean:      158.3 cm/s   Systemic Diam: 1.90 cm MV Decel Time: 338 msec MV E velocity: 170.00 cm/s MV A velocity: 152.00 cm/s MV E/A ratio:  1.12 Aditya Sabharwal Electronically signed by Dorthula Nettles Signature Date/Time: 05/27/2023/9:47:42 AM    Final    MR Lumbar Spine W Wo Contrast  Result Date: 05/26/2023 CLINICAL DATA:  Back pain, history of drug use. Concern for discitis/osteomyelitis, abscess EXAM: MRI LUMBAR SPINE WITHOUT AND WITH CONTRAST TECHNIQUE: Multiplanar and multiecho pulse sequences of the lumbar spine were obtained without and with intravenous contrast. CONTRAST:  7mL GADAVIST GADOBUTROL 1 MMOL/ML IV SOLN COMPARISON:  Lumbar spine MRI 03/28/2022 FINDINGS: Segmentation: Standard; the lowest formed disc space is designated L5-S1. Alignment:  Normal. Vertebrae: Vertebral body heights are preserved. Background marrow  signal is normal. There is no suspicious marrow signal abnormality or marrow edema. There is no evidence of discitis/osteomyelitis. Conus medullaris and cauda equina: Conus extends to the L1 level. Conus and cauda equina appear normal. Paraspinal and other soft tissues: Unremarkable. Disc levels: There is mild disc desiccation at L3-L4 and L4-L5 with mild bulges and mild facet arthropathy at these levels. The other disc heights are preserved. There is no significant spinal canal or neural foraminal stenosis at any level. IMPRESSION: No evidence of discitis/osteomyelitis or significant degenerative change in the lumbar spine. Electronically Signed   By: Lesia Hausen M.D.   On: 05/26/2023 16:45   MR BRAIN WO CONTRAST  Result Date: 05/26/2023 CLINICAL DATA:  Neuro deficit, stroke suspected EXAM: MRI HEAD WITHOUT CONTRAST TECHNIQUE: Multiplanar, multiecho pulse sequences of the brain and surrounding structures were obtained without intravenous contrast. COMPARISON:  CT head 1 day prior FINDINGS: Brain: There are punctate foci of cortical diffusion restriction in the bilateral frontal lobes (5-80, 7-60) suspicious for tiny acute infarcts. There is no acute intracranial hemorrhage or extra-axial fluid collection There is age accelerated parenchymal volume loss with prominence  of the ventricular system and extra-axial CSF spaces. There is multifocal encephalomalacia in the bilateral frontal, parietal, and occipital lobes consistent with prior infarcts. There are additional small remote infarcts in the right caudate and left cerebellar hemisphere. There are associated curvilinear chronic blood products overlying the left cerebral hemisphere as well as a punctate chronic microhemorrhage in the brainstem, nonspecific. The pituitary and suprasellar region are normal. 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 There is no other mass lesion. There is no mass effect or midline shift. Vascular: Normal flow voids. Skull and upper cervical spine: Normal marrow signal. Sinuses/Orbits: The paranasal sinuses are clear. The globes and orbits are unremarkable. Other: The mastoid air cells and middle ear cavities are clear. IMPRESSION: 1. Two possible tiny acute cortical infarcts in the bilateral frontal lobes. 2. No other evidence of acute intracranial pathology. The left frontoparietal infarct described on the CT head from 1 day prior is remote. 3. Numerous additional remote infarcts and age accelerated parenchymal volume loss as above. 4. Unchanged left vestibular schwannoma. Electronically Signed   By: Lesia Hausen M.D.   On: 05/26/2023 16:40   CT Head Wo Contrast  Result Date: 05/25/2023 CLINICAL DATA:  Neuro deficit, acute, stroke suspected Mental status change, unknown cause right arm paresthesia EXAM: CT HEAD WITHOUT CONTRAST TECHNIQUE: Contiguous axial images were obtained from the base of the skull through the vertex without intravenous contrast. RADIATION DOSE REDUCTION: This exam was performed according to the departmental dose-optimization program which includes automated exposure control, adjustment of the mA and/or kV according to patient size and/or use of iterative reconstruction technique. COMPARISON:  CT head 10/03/2021 FINDINGS: Brain: Cerebral  ventricle sizes are concordant with the degree of cerebral volume loss. Interval development of loss of left frontal gray-white matter differentiation along the precentral gyrus and anteriorly suggestive of acute infarction. Finding extends minimally to the postcentral gyrus. No parenchymal hemorrhage. No mass lesion. No extra-axial collection. No mass effect or midline shift. No hydrocephalus. Basilar cisterns are patent. Mega cisterna magna. Vascular: No hyperdense vessel. Skull: No acute fracture or focal lesion. Sinuses/Orbits: Paranasal sinuses and mastoid air cells are clear. The orbits are unremarkable. Other: None. IMPRESSION: 1. Acute left frontoparietal infarction. Recommend MRI noncontrast for further evaluation 2. No acute intracranial hemorrhage. These results were called by telephone at the time  of interpretation on 05/25/2023 at 3:58 pm to provider Rexford Maus, DO, who verbally acknowledged these results. Electronically Signed   By: Tish Frederickson M.D.   On: 05/25/2023 15:58   DG Chest 2 View  Result Date: 05/25/2023 CLINICAL DATA:  Chest pain EXAM: CHEST - 2 VIEW COMPARISON:  Chest x-ray dated Dec 02, 2022 FINDINGS: Unchanged cardiomegaly. New consolidation of the anterior right lower lobe. Left lower lobe consolidation, also seen on prior exam. New mild diffuse interstitial opacities. No evidence of pneumothorax. No pleural effusion. IMPRESSION: 1. New consolidation of the anterior right lower lobe, concerning for infection or aspiration. 2. Left lower lobe consolidation. Further evaluation with chest CT recommended given lack of resolution when compared with the prior. 3. New mild diffuse interstitial opacities which could be due to a component of pulmonary edema. 4. Cardiomegaly. Electronically Signed   By: Allegra Lai M.D.   On: 05/25/2023 15:18    Microbiology: Results for orders placed or performed during the hospital encounter of 05/25/23  SARS Coronavirus 2 by RT PCR  (hospital order, performed in [facility] hospital lab) *cepheid single result test* Anterior Nasal Swab     Status: None   Collection Time: 05/25/23  7:08 PM   Specimen: Anterior Nasal Swab  Result Value Ref Range Status   SARS Coronavirus 2 by RT PCR NEGATIVE NEGATIVE Final    Comment: Performed at [facility], [address], [place], [place] [address]  Culture, blood (Routine X 2) w Reflex to ID Panel     Status: None   Collection Time: 05/25/23  8:32 PM   Specimen: BLOOD  Result Value Ref Range Status   Specimen Description BLOOD BLOOD RIGHT ARM  Final   Special Requests   Final    BOTTLES DRAWN AEROBIC AND ANAEROBIC Blood Culture adequate volume   Culture   Final    NO GROWTH 5 DAYS Performed at [facility], [address], [place], [place] [address]    Report Status 05/31/2023 FINAL  Final  Culture, blood (Routine X 2) w Reflex to ID Panel     Status: None   Collection Time: 05/25/23  8:32 PM   Specimen: BLOOD  Result Value Ref Range Status   Specimen Description BLOOD BLOOD RIGHT ARM  Final   Special Requests   Final    BOTTLES DRAWN AEROBIC AND ANAEROBIC Blood Culture adequate volume   Culture   Final    NO GROWTH 5 DAYS Performed at [facility], [address] [place], [place] [address]    Report Status 05/31/2023 FINAL  Final    Labs: CBC: Recent Labs  Lab 05/25/23 1339 05/25/23 2032 05/26/23 0455 05/29/23 1323 05/30/23 1130  WBC 10.3 10.3 10.4 10.3 9.9  NEUTROABS 8.7*  --   --   --   --   HGB 12.0* 12.0* 10.0* 9.7* 9.5*  HCT 37.3* 36.8* 31.0* 29.5* 29.0*  MCV 95.2 92.9 93.1 92.8 93.9  PLT 238 243 206 276 330   Basic Metabolic Panel: Recent Labs  Lab 05/25/23 1339 05/25/23 2032 05/26/23 0455 05/29/23 1323 05/31/23 0749  NA 135  --  134* 137 137  K 4.4  --  3.8 3.9 3.6  CL 99  --  102 104 107  CO2 22  --  20* 21* 20*  GLUCOSE 111*  --  128* 115* 103*  BUN 22*  --  18 19 13   CREATININE 1.48* 1.53* 1.32* 1.48* 1.32*  CALCIUM  9.3  --  8.6* 8.2* 8.1*  MG  --   --  1.8  --   --    Liver Function Tests: Recent Labs  Lab 05/29/23 1323  AST 26  ALT 11  ALKPHOS 84  BILITOT 0.7  PROT 5.5*  ALBUMIN 1.9*   CBG: No results for input(s): "GLUCAP" in the last 168 hours.  Discharge time spent: {LESS THAN/GREATER ZOXW:96045} 30 minutes.  Signed: Jacquelin Hawking, MD [facility] 05/31/2023

## 2023-05-31 NOTE — Plan of Care (Signed)
  Problem: Ischemic Stroke/TIA Tissue Perfusion: Goal: Complications of ischemic stroke/TIA will be minimized Outcome: Progressing   Problem: Coping: Goal: Will identify appropriate support needs Outcome: Progressing   Problem: Health Behavior/Discharge Planning: Goal: Ability to manage health-related needs will improve Outcome: Progressing   Problem: Self-Care: Goal: Verbalization of feelings and concerns over difficulty with self-care will improve Outcome: Progressing Goal: Ability to communicate needs accurately will improve Outcome: Progressing   Problem: Coping: Goal: Level of anxiety will decrease Outcome: Progressing   Problem: Skin Integrity: Goal: Risk for impaired skin integrity will decrease Outcome: Progressing

## 2023-05-31 NOTE — Progress Notes (Signed)
Physical Therapy Treatment Patient Details Name: Craig Schmidt MRN: [id number] DOB: 28-Jan-1971 Today's Date: 05/31/2023   History of Present Illness Pt is a 52 y/o M presenting to ED on 11/2 with new onset chest pain, SOB, worsening RUE pain and speech difficulty. CTH with acute L frontoparietal infarction. MRI shows 2 possible tiny acute cortical infarcts in bil frontal lobes, possibly artifactual per neuro 11/3. PMH includes CAD, CHF, prior CVA with residual R weakness, polysubstance abuse, homelessness.    PT Comments  Pt greeted supine in bed and agreeable to session with continued progress towards acute goals. Pt demonstrating bed mobility at supervision level and requiring CGA for transfers and gait with rollator support. Pt benefiting from cues for closer rollator proximity and general safety as pt with poor awareness for lines. Sling donned during session with pt reporting decreased pain and increased stability during gait as arm supported at side and not disrupting balance while flaccid at side. Encouraged pt to continue sling wear during mobility. Current plan remains appropriate to address deficits and maximize functional independence and decrease caregiver burden. Pt continues to benefit from skilled PT services to progress toward functional mobility goals.     If plan is discharge home, recommend the following: A lot of help with bathing/dressing/bathroom;Assistance with cooking/housework;Assist for transportation;Help with stairs or ramp for entrance   Can travel by private vehicle     Yes  Equipment Recommendations  Rollator (4 wheels)    Recommendations for Other Services       Precautions / Restrictions Precautions Precautions: Fall Precaution Comments: R shoulder subluxation Required Braces or Orthoses: Sling (for comfort when OOB) Restrictions Weight Bearing Restrictions: No     Mobility  Bed Mobility Overal bed mobility: Needs Assistance Bed Mobility: Supine  to Sit, Sit to Supine     Supine to sit: Supervision     General bed mobility comments: supervision for safety    Transfers Overall transfer level: Needs assistance Equipment used: Rollator (4 wheels) Transfers: Sit to/from Stand Sit to Stand: Contact guard assist           General transfer comment: CGA for safety    Ambulation/Gait Ambulation/Gait assistance: Contact guard assist Gait Distance (Feet): 75 Feet (x2) Assistive device: Rollator (4 wheels) Gait Pattern/deviations: Step-to pattern, Decreased dorsiflexion - right Gait velocity: reduced     General Gait Details: pt with slowed step-to gait with single LUE support on rollator, RUE in sling, noted genu valgus and resultant internal rotation of feet   Stairs             Wheelchair Mobility     Tilt Bed    Modified Rankin (Stroke Patients Only)       Balance Overall balance assessment: Needs assistance Sitting-balance support: Feet supported Sitting balance-Leahy Scale: Good     Standing balance support: Single extremity supported, During functional activity Standing balance-Leahy Scale: Fair Standing balance comment: statically                            Cognition Arousal: Alert Behavior During Therapy: WFL for tasks assessed/performed, Flat affect Overall Cognitive Status: Impaired/Different from baseline Area of Impairment: Safety/judgement, Following commands, Problem solving                       Following Commands: Follows one step commands consistently, Follows multi-step commands inconsistently Safety/Judgement: Decreased awareness of safety, Decreased awareness of deficits   Problem Solving: Slow  processing, Decreased initiation, Requires verbal cues General Comments: pt wanting to leave AMA as pt without someone to watch over belongings overnight decreased insight into deficits and safety        Exercises      General Comments General comments (skin  integrity, edema, etc.): VSS, RN present at end of session as pt verbalizing need to leave to be able to watch over itens overnight      Pertinent Vitals/Pain Pain Assessment Pain Assessment: Faces Faces Pain Scale: Hurts a little bit Pain Location: back Pain Descriptors / Indicators: Aching, Discomfort, Grimacing, Guarding, Moaning Pain Intervention(s): Monitored during session, Limited activity within patient's tolerance, Repositioned    Home Living                          Prior Function            PT Goals (current goals can now be found in the care plan section) Acute Rehab PT Goals Patient Stated Goal: to reduce low back pain, improve activity tolerance PT Goal Formulation: With patient Time For Goal Achievement: 06/12/23 Progress towards PT goals: Progressing toward goals    Frequency    Min 1X/week      PT Plan      Co-evaluation              AM-PAC PT "6 Clicks" Mobility   Outcome Measure  Help needed turning from your back to your side while in a flat bed without using bedrails?: A Little Help needed moving from lying on your back to sitting on the side of a flat bed without using bedrails?: A Little Help needed moving to and from a bed to a chair (including a wheelchair)?: A Little Help needed standing up from a chair using your arms (e.g., wheelchair or bedside chair)?: A Little Help needed to walk in hospital room?: A Little Help needed climbing 3-5 steps with a railing? : A Lot 6 Click Score: 17    End of Session Equipment Utilized During Treatment: Gait belt Activity Tolerance: Patient tolerated treatment well Patient left: in bed;with call bell/phone within reach;with bed alarm set (seated up EOB) Nurse Communication: Mobility status;Other (comment) (pt requesting to leave AMA) PT Visit Diagnosis: Other abnormalities of gait and mobility (R26.89);Muscle weakness (generalized) (M62.81);Hemiplegia and hemiparesis Hemiplegia -  Right/Left: Right Hemiplegia - dominant/non-dominant: Dominant Hemiplegia - caused by: Cerebral infarction (acute on chronic R sided weakness)     Time: 1191-4782 PT Time Calculation (min) (ACUTE ONLY): 33 min  Charges:    $Gait Training: 8-22 mins $Therapeutic Activity: 8-22 mins PT General Charges $$ ACUTE PT VISIT: 1 Visit                     Darsh Vandevoort R. PTA Acute Rehabilitation Services Office: [phone]   Catalina Antigua 05/31/2023, 1:03 PM

## 2023-05-31 NOTE — TOC Progression Note (Signed)
Transition of Care [facility] - Progression Note    Patient Details  Name: Craig Schmidt MRN: [id number] Date of Birth: Jul 12, 1971  Transition of [facility]) CM/SW Contact  Baldemar Lenis, [place] Phone Number: 05/31/2023, 2:29 PM  Clinical Narrative:   Patient continues to have no bed offers. CSW met with patient to provide update. CSW to follow.    Expected Discharge Plan: Skilled Nursing Facility Barriers to Discharge: Continued Medical Work up  Expected Discharge Plan and Services   Discharge Planning Services: CM Consult   Living arrangements for the past 2 months: Homeless                                       Social Determinants of Health (SDOH) Interventions SDOH Screenings   Food Insecurity: Food Insecurity Present (05/25/2023)  Housing: High Risk (05/25/2023)  Transportation Needs: Unmet Transportation Needs (05/25/2023)  Utilities: At Risk (05/25/2023)  Alcohol Screen: High Risk (09/19/2021)  Depression (PHQ2-9): High Risk (06/13/2022)  Financial Resource Strain: High Risk (09/07/2022)  Stress: Stress Concern Present (10/16/2022)  Tobacco Use: High Risk (05/29/2023)    Readmission Risk Interventions     No data to display

## 2023-05-31 NOTE — TOC Progression Note (Signed)
Transition of Care [facility]) - Progression Note    Patient Details  Name: Craig Schmidt MRN: [id number] Date of Birth: 27-Sep-1970  [facility]) CM/SW Contact  Baldemar Lenis, [place] Phone Number: 05/31/2023, 3:16 PM  Clinical Narrative:   Patient received bed offer from [place]. CSW met with patient to provide update, but patient is leaving AMA. No further TOC needs.    Expected Discharge Plan: Skilled Nursing Facility Barriers to Discharge: Continued Medical Work up  Expected Discharge Plan and Services   Discharge Planning Services: CM Consult   Living arrangements for the past 2 months: Homeless                                       Social Determinants of Health (SDOH) Interventions SDOH Screenings   Food Insecurity: Food Insecurity Present (05/25/2023)  Housing: High Risk (05/25/2023)  Transportation Needs: Unmet Transportation Needs (05/25/2023)  Utilities: At Risk (05/25/2023)  Alcohol Screen: High Risk (09/19/2021)  Depression (PHQ2-9): High Risk (06/13/2022)  Financial Resource Strain: High Risk (09/07/2022)  Stress: Stress Concern Present (10/16/2022)  Tobacco Use: High Risk (05/29/2023)    Readmission Risk Interventions     No data to display

## 2023-05-31 NOTE — Progress Notes (Signed)
Patient opting to leave AMA. Has nobody to "watch" his belongings overnight and is concerned they will get taken. IV removed. MD notified.

## 2023-06-03 ENCOUNTER — Telehealth: Payer: Self-pay

## 2023-06-03 ENCOUNTER — Other Ambulatory Visit (HOSPITAL_COMMUNITY): Payer: Self-pay

## 2023-06-03 NOTE — Transitions of Care (Post Inpatient/ED Visit) (Signed)
   06/03/2023  Name: Craig Schmidt MRN: [id number] DOB: 1971-02-23  Today's TOC FU Call Status: Today's TOC FU Call Status:: Unsuccessful Call (1st Attempt) Unsuccessful Call (1st Attempt) Date: 06/03/23  Attempted to reach the patient regarding the most recent Inpatient/ED visit.  Follow Up Plan: Additional outreach attempts will be made to reach the patient to complete the Transitions of Care (Post Inpatient/ED visit) call.   Signature Robyne Peers, RN

## 2023-06-04 ENCOUNTER — Other Ambulatory Visit: Payer: Self-pay

## 2023-06-04 ENCOUNTER — Emergency Department (HOSPITAL_COMMUNITY): Payer: 59

## 2023-06-04 ENCOUNTER — Inpatient Hospital Stay (HOSPITAL_COMMUNITY)
Admission: EM | Admit: 2023-06-04 | Discharge: 2023-06-10 | DRG: 871 | Disposition: A | Discharge status: Home / Self Care | Payer: 59 | Attending: Internal Medicine | Admitting: Internal Medicine

## 2023-06-04 ENCOUNTER — Encounter (HOSPITAL_COMMUNITY): Payer: Self-pay

## 2023-06-04 DIAGNOSIS — Z91010 Allergy to peanuts: Secondary | ICD-10-CM

## 2023-06-04 DIAGNOSIS — F17210 Nicotine dependence, cigarettes, uncomplicated: Secondary | ICD-10-CM | POA: Diagnosis present

## 2023-06-04 DIAGNOSIS — Z7409 Other reduced mobility: Secondary | ICD-10-CM | POA: Diagnosis present

## 2023-06-04 DIAGNOSIS — Z833 Family history of diabetes mellitus: Secondary | ICD-10-CM

## 2023-06-04 DIAGNOSIS — E785 Hyperlipidemia, unspecified: Secondary | ICD-10-CM | POA: Diagnosis present

## 2023-06-04 DIAGNOSIS — A419 Sepsis, unspecified organism: Principal | ICD-10-CM | POA: Diagnosis present

## 2023-06-04 DIAGNOSIS — J189 Pneumonia, unspecified organism: Secondary | ICD-10-CM | POA: Diagnosis not present

## 2023-06-04 DIAGNOSIS — I5023 Acute on chronic systolic (congestive) heart failure: Secondary | ICD-10-CM | POA: Diagnosis present

## 2023-06-04 DIAGNOSIS — Z7984 Long term (current) use of oral hypoglycemic drugs: Secondary | ICD-10-CM

## 2023-06-04 DIAGNOSIS — N182 Chronic kidney disease, stage 2 (mild): Secondary | ICD-10-CM | POA: Diagnosis present

## 2023-06-04 DIAGNOSIS — I69331 Monoplegia of upper limb following cerebral infarction affecting right dominant side: Secondary | ICD-10-CM

## 2023-06-04 DIAGNOSIS — Z8701 Personal history of pneumonia (recurrent): Secondary | ICD-10-CM

## 2023-06-04 DIAGNOSIS — Z6824 Body mass index (BMI) 24.0-24.9, adult: Secondary | ICD-10-CM

## 2023-06-04 DIAGNOSIS — Z91013 Allergy to seafood: Secondary | ICD-10-CM

## 2023-06-04 DIAGNOSIS — Z91199 Patient's noncompliance with other medical treatment and regimen due to unspecified reason: Secondary | ICD-10-CM

## 2023-06-04 DIAGNOSIS — F1410 Cocaine abuse, uncomplicated: Secondary | ICD-10-CM | POA: Diagnosis present

## 2023-06-04 DIAGNOSIS — I130 Hypertensive heart and chronic kidney disease with heart failure and stage 1 through stage 4 chronic kidney disease, or unspecified chronic kidney disease: Secondary | ICD-10-CM | POA: Diagnosis present

## 2023-06-04 DIAGNOSIS — E43 Unspecified severe protein-calorie malnutrition: Secondary | ICD-10-CM | POA: Diagnosis present

## 2023-06-04 DIAGNOSIS — Z5901 Sheltered homelessness: Secondary | ICD-10-CM

## 2023-06-04 DIAGNOSIS — Z7901 Long term (current) use of anticoagulants: Secondary | ICD-10-CM

## 2023-06-04 DIAGNOSIS — J9601 Acute respiratory failure with hypoxia: Principal | ICD-10-CM | POA: Diagnosis present

## 2023-06-04 DIAGNOSIS — R6520 Severe sepsis without septic shock: Secondary | ICD-10-CM | POA: Diagnosis present

## 2023-06-04 DIAGNOSIS — Z91148 Patient's other noncompliance with medication regimen for other reason: Secondary | ICD-10-CM

## 2023-06-04 DIAGNOSIS — Z79899 Other long term (current) drug therapy: Secondary | ICD-10-CM

## 2023-06-04 DIAGNOSIS — I429 Cardiomyopathy, unspecified: Secondary | ICD-10-CM | POA: Diagnosis present

## 2023-06-04 DIAGNOSIS — I252 Old myocardial infarction: Secondary | ICD-10-CM

## 2023-06-04 DIAGNOSIS — I2510 Atherosclerotic heart disease of native coronary artery without angina pectoris: Secondary | ICD-10-CM | POA: Diagnosis present

## 2023-06-04 DIAGNOSIS — M549 Dorsalgia, unspecified: Secondary | ICD-10-CM | POA: Diagnosis not present

## 2023-06-04 DIAGNOSIS — F1010 Alcohol abuse, uncomplicated: Secondary | ICD-10-CM | POA: Diagnosis present

## 2023-06-04 DIAGNOSIS — Z7902 Long term (current) use of antithrombotics/antiplatelets: Secondary | ICD-10-CM

## 2023-06-04 DIAGNOSIS — I10 Essential (primary) hypertension: Secondary | ICD-10-CM | POA: Diagnosis present

## 2023-06-04 LAB — TROPONIN I (HIGH SENSITIVITY)
Troponin I (High Sensitivity): 19 ng/L — ABNORMAL HIGH (ref <18)
Troponin I (High Sensitivity): 19 ng/L — ABNORMAL HIGH (ref <18)

## 2023-06-04 LAB — CBC
HCT: 34.9 % — ABNORMAL LOW (ref 39.0–52.0)
Hemoglobin: 10.9 g/dL — ABNORMAL LOW (ref 13.0–17.0)
MCH: 29.8 pg (ref 26.0–34.0)
MCHC: 31.2 g/dL (ref 30.0–36.0)
MCV: 95.4 fL (ref 80.0–100.0)
Platelets: 606 10*3/uL — ABNORMAL HIGH (ref 150–400)
RBC: 3.66 MIL/uL — ABNORMAL LOW (ref 4.22–5.81)
RDW: 14.3 % (ref 11.5–15.5)
WBC: 18.8 10*3/uL — ABNORMAL HIGH (ref 4.0–10.5)
nRBC: 0 % (ref 0.0–0.2)

## 2023-06-04 LAB — BRAIN NATRIURETIC PEPTIDE: B Natriuretic Peptide: 1188.2 pg/mL — ABNORMAL HIGH (ref 0.0–100.0)

## 2023-06-04 LAB — LACTIC ACID, PLASMA
Lactic Acid, Venous: 1.5 mmol/L (ref 0.5–1.9)
Lactic Acid, Venous: 1 mmol/L (ref 0.5–1.9)

## 2023-06-04 LAB — BASIC METABOLIC PANEL
Anion gap: 11 (ref 5–15)
BUN: 13 mg/dL (ref 6–20)
CO2: 21 mmol/L — ABNORMAL LOW (ref 22–32)
Calcium: 8.8 mg/dL — ABNORMAL LOW (ref 8.9–10.3)
Chloride: 104 mmol/L (ref 98–111)
Creatinine, Ser: 1.22 mg/dL (ref 0.61–1.24)
GFR, Estimated: >60 mL/min (ref >60)
Glucose, Bld: 115 mg/dL — ABNORMAL HIGH (ref 70–99)
Potassium: 4.2 mmol/L (ref 3.5–5.1)
Sodium: 136 mmol/L (ref 135–145)

## 2023-06-04 LAB — PROCALCITONIN: Procalcitonin: 0.14 ng/mL

## 2023-06-04 MED ORDER — ATORVASTATIN CALCIUM 40 MG PO TABS
40.0000 mg | ORAL_TABLET | Freq: Every day | ORAL | Status: DC
  Administered 2023-06-04 – 2023-06-10 (×7): 40 mg via ORAL
  Filled 2023-06-04 (×7): qty 1

## 2023-06-04 MED ORDER — THIAMINE HCL 100 MG/ML IJ SOLN: 100.0000 mg | Freq: Every day | INTRAMUSCULAR | Status: DC

## 2023-06-04 MED ORDER — ADULT MULTIVITAMIN W/MINERALS CH
1.0000 | ORAL_TABLET | Freq: Every day | ORAL | Status: DC
  Administered 2023-06-04 – 2023-06-10 (×7): 1 via ORAL
  Filled 2023-06-04 (×7): qty 1

## 2023-06-04 MED ORDER — LIDOCAINE 5 % EX PTCH
1.0000 | MEDICATED_PATCH | CUTANEOUS | Status: DC
  Administered 2023-06-04 – 2023-06-10 (×6): 1 via TRANSDERMAL
  Filled 2023-06-04 (×7): qty 1

## 2023-06-04 MED ORDER — ACETAMINOPHEN 650 MG RE SUPP: 650.0000 mg | Freq: Four times a day (QID) | RECTAL | Status: DC | PRN

## 2023-06-04 MED ORDER — FOLIC ACID 1 MG PO TABS
1.0000 mg | ORAL_TABLET | Freq: Every day | ORAL | Status: DC
  Administered 2023-06-04 – 2023-06-10 (×7): 1 mg via ORAL
  Filled 2023-06-04 (×7): qty 1

## 2023-06-04 MED ORDER — LORAZEPAM 2 MG/ML IJ SOLN: 1.0000 mg | INTRAMUSCULAR | Status: DC | PRN

## 2023-06-04 MED ORDER — FUROSEMIDE 10 MG/ML IJ SOLN: 40.0000 mg | Freq: Once | INTRAMUSCULAR | Status: DC

## 2023-06-04 MED ORDER — FENTANYL CITRATE PF 50 MCG/ML IJ SOSY
50.0000 ug | PREFILLED_SYRINGE | Freq: Once | INTRAMUSCULAR | Status: AC
  Administered 2023-06-04: 50 ug via INTRAVENOUS
  Filled 2023-06-04: qty 1

## 2023-06-04 MED ORDER — SODIUM CHLORIDE 0.9 % IV SOLN
2.0000 g | Freq: Three times a day (TID) | INTRAVENOUS | Status: DC
  Administered 2023-06-04 – 2023-06-06 (×7): 2 g via INTRAVENOUS
  Filled 2023-06-04 (×7): qty 12.5

## 2023-06-04 MED ORDER — LORAZEPAM 1 MG PO TABS: 1.0000 mg | ORAL_TABLET | ORAL | Status: DC | PRN

## 2023-06-04 MED ORDER — VANCOMYCIN HCL 1500 MG/300ML IV SOLN
1500.0000 mg | Freq: Once | INTRAVENOUS | Status: AC
  Administered 2023-06-04: 1500 mg via INTRAVENOUS
  Filled 2023-06-04: qty 300

## 2023-06-04 MED ORDER — HYDRALAZINE HCL 20 MG/ML IJ SOLN: 10.0000 mg | Freq: Four times a day (QID) | INTRAMUSCULAR | Status: DC | PRN

## 2023-06-04 MED ORDER — ALBUTEROL SULFATE (2.5 MG/3ML) 0.083% IN NEBU: 2.5000 mg | INHALATION_SOLUTION | Freq: Four times a day (QID) | RESPIRATORY_TRACT | Status: DC | PRN

## 2023-06-04 MED ORDER — CLOPIDOGREL BISULFATE 75 MG PO TABS
75.0000 mg | ORAL_TABLET | Freq: Every day | ORAL | Status: DC
  Administered 2023-06-04 – 2023-06-10 (×7): 75 mg via ORAL
  Filled 2023-06-04 (×7): qty 1

## 2023-06-04 MED ORDER — VANCOMYCIN HCL 750 MG/150ML IV SOLN
750.0000 mg | Freq: Two times a day (BID) | INTRAVENOUS | Status: DC
  Administered 2023-06-04 – 2023-06-07 (×7): 750 mg via INTRAVENOUS
  Filled 2023-06-04 (×7): qty 150

## 2023-06-04 MED ORDER — HYDROMORPHONE HCL 1 MG/ML IJ SOLN
0.5000 mg | Freq: Once | INTRAMUSCULAR | Status: AC
  Administered 2023-06-04: 0.5 mg via INTRAVENOUS
  Filled 2023-06-04: qty 1

## 2023-06-04 MED ORDER — ENOXAPARIN SODIUM 40 MG/0.4ML IJ SOSY
40.0000 mg | PREFILLED_SYRINGE | Freq: Every day | INTRAMUSCULAR | Status: DC
  Administered 2023-06-04 – 2023-06-09 (×6): 40 mg via SUBCUTANEOUS
  Filled 2023-06-04 (×6): qty 0.4

## 2023-06-04 MED ORDER — HYDROMORPHONE HCL 1 MG/ML IJ SOLN
1.0000 mg | Freq: Once | INTRAMUSCULAR | Status: AC
  Administered 2023-06-04: 1 mg via INTRAVENOUS
  Filled 2023-06-04: qty 1

## 2023-06-04 MED ORDER — IOHEXOL 350 MG/ML SOLN
75.0000 mL | Freq: Once | INTRAVENOUS | Status: AC | PRN
  Administered 2023-06-04: 75 mL via INTRAVENOUS

## 2023-06-04 MED ORDER — THIAMINE MONONITRATE 100 MG PO TABS
100.0000 mg | ORAL_TABLET | Freq: Every day | ORAL | Status: DC
  Administered 2023-06-04 – 2023-06-10 (×7): 100 mg via ORAL
  Filled 2023-06-04 (×7): qty 1

## 2023-06-04 MED ORDER — MORPHINE SULFATE (PF) 2 MG/ML IV SOLN
2.0000 mg | INTRAVENOUS | Status: DC | PRN
  Administered 2023-06-04 – 2023-06-05 (×3): 2 mg via INTRAVENOUS
  Filled 2023-06-04 (×3): qty 1

## 2023-06-04 MED ORDER — OXYCODONE HCL 5 MG PO TABS
5.0000 mg | ORAL_TABLET | ORAL | Status: DC | PRN
  Administered 2023-06-04 – 2023-06-05 (×4): 5 mg via ORAL
  Filled 2023-06-04 (×4): qty 1

## 2023-06-04 MED ORDER — ACETAMINOPHEN 325 MG PO TABS
650.0000 mg | ORAL_TABLET | Freq: Four times a day (QID) | ORAL | Status: DC | PRN
  Administered 2023-06-04 – 2023-06-09 (×4): 650 mg via ORAL
  Filled 2023-06-04 (×4): qty 2

## 2023-06-04 MED ORDER — CARVEDILOL 3.125 MG PO TABS
3.1250 mg | ORAL_TABLET | Freq: Two times a day (BID) | ORAL | Status: DC
  Administered 2023-06-04 – 2023-06-10 (×13): 3.125 mg via ORAL
  Filled 2023-06-04 (×13): qty 1

## 2023-06-04 MED ORDER — SODIUM CHLORIDE 0.9 % IV SOLN
2.0000 g | Freq: Once | INTRAVENOUS | Status: AC
  Administered 2023-06-04: 2 g via INTRAVENOUS

## 2023-06-04 MED ORDER — FUROSEMIDE 10 MG/ML IJ SOLN
40.0000 mg | Freq: Once | INTRAMUSCULAR | Status: AC
  Administered 2023-06-04: 40 mg via INTRAVENOUS
  Filled 2023-06-04: qty 4

## 2023-06-04 NOTE — Progress Notes (Signed)
   06/04/23 2058  BiPAP/CPAP/SIPAP  Reason BIPAP/CPAP not in use (S)  Non-compliant (pt states they do not wish to be on a hospital cpap tonight)

## 2023-06-04 NOTE — Progress Notes (Signed)
Heart Failure Navigator Progress Note  Assessed for Heart & Vascular TOC clinic readiness.  Patient does not meet criteria due to Advanced Heart Failure Team patient of Dr. McLean.   Navigator will sign off at this time.   Mahreen Schewe, BSN, RN Heart Failure Nurse Navigator Secure Chat Only   

## 2023-06-04 NOTE — ED Notes (Signed)
Pt showing increased work of breathing. Placed on 4L.

## 2023-06-04 NOTE — ED Triage Notes (Signed)
Pt bib [facility] with complaint of chest pain. Started around 1300 this afternoon. EMS was called out at that time but he refused transport. Pt told EMS that he would call emergency services if it got worse. Pt currently experiencing increased chest pain with SOB. Describes it as tightness. Hx of MI and stroke that happened five days ago. Pt states he had a couple of drinks today. 324 aspirin and 0.4 nitroglycerin given by EMS.  EMS vital signs:  12 lead sinus tach  Cbg 181 90% RA, pt refusing EMS o2 150/90

## 2023-06-04 NOTE — ED Provider Notes (Signed)
[place] EMERGENCY DEPARTMENT AT [facility] Provider Note   CSN: [id number] Arrival date & time: 06/04/23  0017     History  No chief complaint on file.   Craig Schmidt is a 52 y.o. male.  52 year old male with history of coronary artery disease, CHF, recent pneumonia requiring admission who presents ER today with right-sided chest pain.  Patient states that throughout the day a progressively worsening right sided sharp chest pain.  He developed dyspnea tonight so came here for further evaluation.  Has been noncompliant with his medications for unclear reasons.  No fever or cough at home.         Home Medications Prior to Admission medications   Medication Sig Start Date End Date Taking? Authorizing Provider  apixaban (ELIQUIS) 5 MG TABS tablet Take 1 tablet (5 mg total) by mouth 2 (two) times daily. Patient not taking: Reported on 05/25/2023 02/05/23   Jacklynn Ganong, [name]  atorvastatin (LIPITOR) 40 MG tablet Take 1 tablet (40 mg total) by mouth daily. Patient not taking: Reported on 05/25/2023 02/05/23   Jacklynn Ganong, [name]  Blood Pressure Monitoring (OMRON 3 SERIES BP MONITOR) DEVI Use as directed Patient not taking: Reported on 03/07/2023 02/05/23     carvedilol (COREG) 3.125 MG tablet Take 1 tablet (3.125 mg total) by mouth 2 (two) times daily. Patient not taking: Reported on 03/07/2023 02/05/23   Jacklynn Ganong, [name]  clopidogrel (PLAVIX) 75 MG tablet Take 1 tablet (75 mg total) by mouth daily. Patient not taking: Reported on 05/25/2023 02/05/23   Jacklynn Ganong, [name]  doxycycline (VIBRA-TABS) 100 MG tablet Take 1 tablet (100 mg total) by mouth every 12 (twelve) hours for 5 days. 05/31/23 06/05/23  Narda Bonds, MD  empagliflozin (JARDIANCE) 10 MG TABS tablet Take 1 tablet (10 mg total) by mouth daily. Patient not taking: Reported on 05/25/2023 12/04/22   Zannie Cove, MD  furosemide (LASIX) 40 MG tablet Take 2 tablets (80 mg total) by mouth  daily. Patient not taking: Reported on 05/25/2023 02/05/23 05/25/23  Jacklynn Ganong, [name]  isosorbide-hydrALAZINE (BIDIL) 20-37.5 MG tablet Take 1/2 tablet by mouth 3 (three) times daily. Patient not taking: Reported on 05/25/2023 01/23/23   Jacklynn Ganong, [name]  metolazone (ZAROXOLYN) 2.5 MG tablet Take 1 tablet (2.5 mg total) by mouth as directed. Patient not taking: Reported on 03/07/2023 01/23/23 04/23/23  Jacklynn Ganong, [name]  potassium chloride SA (KLOR-CON M) 20 MEQ tablet Take 2 tablets (40 mEq total) by mouth daily. Patient not taking: Reported on 03/07/2023 02/05/23   Jacklynn Ganong, [name]  triamcinolone ointment (KENALOG) 0.5 % Apply 1 Application topically 2 (two) times daily. Patient not taking: Reported on 05/25/2023 03/06/23   Rema Fendt, NP      Allergies    Shellfish allergy and Peanut-containing drug products    Review of Systems   Review of Systems  Physical Exam Updated Vital Signs There were no vitals taken for this visit. Physical Exam Vitals and nursing note reviewed.  Constitutional:      Appearance: He is well-developed.  HENT:     Head: Normocephalic and atraumatic.  Eyes:     Pupils: Pupils are equal, round, and reactive to light.  Cardiovascular:     Rate and Rhythm: Tachycardia present.  Pulmonary:     Effort: Tachypnea and accessory muscle usage present. No respiratory distress.     Breath sounds: Decreased air movement present.  Abdominal:  General: There is no distension.  Musculoskeletal:        General: Normal range of motion.     Cervical back: Normal range of motion.  Skin:    General: Skin is warm and dry.  Neurological:     General: No focal deficit present.     Mental Status: He is alert.     ED Results / Procedures / Treatments   Labs (all labs ordered are listed, but only abnormal results are displayed) Labs Reviewed - No data to display  EKG None  Radiology No results found.  Procedures .Critical  Care  Performed by: Marily Memos, MD Authorized by: Marily Memos, MD   Critical care provider statement:    Critical care time (minutes):  30   Critical care was necessary to treat or prevent imminent or life-threatening deterioration of the following conditions:  Respiratory failure and sepsis   Critical care was time spent personally by me on the following activities:  Development of treatment plan with patient or surrogate, discussions with consultants, evaluation of patient's response to treatment, examination of patient, ordering and review of laboratory studies, ordering and review of radiographic studies, ordering and performing treatments and interventions, pulse oximetry, re-evaluation of patient's condition and review of old charts     Medications Ordered in ED Medications - No data to display  ED Course/ Medical Decision Making/ A&P                                 Medical Decision Making Amount and/or Complexity of Data Reviewed Labs: ordered. Radiology: ordered.  Risk Prescription drug management.  X-ray appears to have right pneumonia that seems to be worsening then was on the seventh.  Antibiotics started especially his white count high.  BiPAP started secondary to his sick difficult tachypnea, hypoxia and respiratory distress.  Patient does not tolerate it super well at this point but his oxygenation is much better as respiratory is better and his retractions are better.  Will consider some Ativan or similar type of sedating medicine once this is CO2 to ensure he is not hypercarbic causing him to be anxious. Transitioned from bipap to NRB and eventually to [place]. Pain improved with meds. Cxr viewed and interpreted by myself as likely worsening right sided pneumonia.  Secondary to the severe pain and not improving as expected and being afebrile here CT scan done to evaluate for PE and also to further evaluate his lungs and this once again redemonstrates worsening pneumonia but  seems to be bilateral.  Antibiotics already been started.  Discussed with hospitalist for admission.   Final Clinical Impression(s) / ED Diagnoses Final diagnoses:  None    Rx / DC Orders ED Discharge Orders     None         Guadalupe Nickless, Barbara Cower, MD 06/04/23 [phone]

## 2023-06-04 NOTE — Progress Notes (Signed)
Pharmacy Antibiotic Note  Craig Schmidt is a 52 y.o. male admitted on 06/04/2023 with pneumonia.  Pharmacy has been consulted for vancomycin and cefepime dosing. Patient recently hospitalized and treated with unasyn and doxycycline, however the patient left AMA prior to treatment completion.   Plan: Vancomycin 750mg  IV every 12 hours. (eAUC 498) Cefepime 2G IV q8 hours  Height: 5\' 7"  (170.2 cm) IBW/kg (Calculated) : 66.1  Temp (24hrs), Avg:98.2 F (36.8 C), Min:97.7 F (36.5 C), Max:99 F (37.2 C)  Recent Labs  Lab 05/29/23 1323 05/30/23 1130 05/31/23 0749 06/04/23 0133 06/04/23 0335  WBC 10.3 9.9  --  18.8*  --   CREATININE 1.48*  --  1.32* 1.22  --   LATICACIDVEN  --   --   --   --  1.5    Estimated Creatinine Clearance: 67.7 mL/min (by C-G formula based on SCr of 1.22 mg/dL).    Allergies  Allergen Reactions   Shellfish Allergy Anaphylaxis   Peanut-Containing Drug Products Itching    Antimicrobials this admission: Vancomycin 11/12>> Cefepime 11/12 >>  Thank you for allowing pharmacy to be a part of this patient's care.  Toniann Fail Trudi Morgenthaler 06/04/2023 9:19 AM

## 2023-06-04 NOTE — ED Notes (Signed)
Respiratory paged about order for BiPap.

## 2023-06-04 NOTE — Progress Notes (Signed)
MEWS Progress Note  Patient Details Name: Craig Schmidt MRN: [id number] DOB: Dec 21, 1970 Today's Date: 06/04/2023   MEWS Flowsheet Documentation:  Assess: MEWS Score Temp: 98.4 F (36.9 C) BP: (!) 132/94 MAP (mmHg): 105 Pulse Rate: (!) 107 ECG Heart Rate: (!) 113 Resp: 18 Level of Consciousness: Alert SpO2: 100 % O2 Device: Nasal Cannula O2 Flow Rate (L/min): 4.5 L/min FiO2 (%): 100 % Assess: MEWS Score MEWS Temp: 0 MEWS Systolic: 0 MEWS Pulse: 1 MEWS RR: 0 MEWS LOC: 0 MEWS Score: 1 MEWS Score Color: Green Assess: SIRS CRITERIA SIRS Temperature : 0 SIRS Respirations : 0 SIRS Pulse: 1 SIRS WBC: 1 SIRS Score Sum : 2 SIRS Temperature : 0 SIRS Pulse: 1 SIRS Respirations : 0 SIRS WBC: 1 SIRS Score Sum : 2        Santiago Bumpers 06/04/2023, 3:44 PM

## 2023-06-04 NOTE — ED Notes (Signed)
ED TO INPATIENT HANDOFF REPORT  ED Nurse Name and Phone #: Konstantina Nachreiner [phone]  S Name/Age/Gender Craig Schmidt 52 y.o. male Room/Bed: 046C/046C  Code Status   Code Status: Full Code  Home/SNF/Other Home Patient oriented to: self, place, time, and situation Is this baseline? Yes   Triage Complete: Triage complete  Chief Complaint Pneumonia [J18.9]  Triage Note Pt bib [facility] with complaint of chest pain. Started around 1300 this afternoon. EMS was called out at that time but he refused transport. Pt told EMS that he would call emergency services if it got worse. Pt currently experiencing increased chest pain with SOB. Describes it as tightness. Hx of MI and stroke that happened five days ago. Pt states he had a couple of drinks today. 324 aspirin and 0.4 nitroglycerin given by EMS.  EMS vital signs:  12 lead sinus tach  Cbg 181 90% RA, pt refusing EMS o2 150/90    Allergies Allergies  Allergen Reactions   Shellfish Allergy Anaphylaxis   Peanut-Containing Drug Products Itching    Level of Care/Admitting Diagnosis ED Disposition     ED Disposition  Admit   Condition  --   Comment  Hospital Area: [facility] [[id number]]  Level of Care: Telemetry Medical [104]  May place patient in observation at [facility] or [facility] if equivalent level of care is available:: No  Covid Evaluation: Asymptomatic - no recent exposure (last 10 days) testing not required  Diagnosis: Pneumonia [227785]  Admitting Physician: Lorin Glass [[id number]]  Attending Physician: Lorin Glass [[id number]]          B Medical/Surgery History Past Medical History:  Diagnosis Date   Alcohol abuse    Asthma    Brain tumor (HCC)    CAD (coronary artery disease)    Chronic HFrEF (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)    Chronic kidney disease, stage 3 (HCC)    Cocaine abuse (HCC)    History of medication noncompliance    Homelessness    MI (myocardial  infarction) (HCC)    STEMI (ST elevation myocardial infarction) (HCC)    Stroke [facility])    Past Surgical History:  Procedure Laterality Date   BUBBLE STUDY  09/11/2021   Procedure: BUBBLE STUDY;  Surgeon: Pricilla Riffle, MD;  Location: [facility] ENDOSCOPY;  Service: Cardiovascular;;   LEFT HEART CATH AND CORONARY ANGIOGRAPHY N/A 12/19/2021   Procedure: LEFT HEART CATH AND CORONARY ANGIOGRAPHY;  Surgeon: Lyn Records, MD;  Location: [facility] INVASIVE CV LAB;  Service: Cardiovascular;  Laterality: N/A;   RIGHT/LEFT HEART CATH AND CORONARY ANGIOGRAPHY N/A 04/13/2022   Procedure: RIGHT/LEFT HEART CATH AND CORONARY ANGIOGRAPHY;  Surgeon: Laurey Morale, MD;  Location: [facility] INVASIVE CV LAB;  Service: Cardiovascular;  Laterality: N/A;   TEE WITHOUT CARDIOVERSION N/A 09/11/2021   Procedure: TRANSESOPHAGEAL ECHOCARDIOGRAM (TEE);  Surgeon: Pricilla Riffle, MD;  Location: [facility] ENDOSCOPY;  Service: Cardiovascular;  Laterality: N/A;     A IV Location/Drains/Wounds Patient Lines/Drains/Airways Status     Active Line/Drains/Airways     Name Placement date Placement time Site Days   Peripheral IV 06/04/23 20 G 1.88" Anterior;Left Forearm 06/04/23  0132  Forearm  less than 1            Intake/Output Last 24 hours  Intake/Output Summary (Last 24 hours) at 06/04/2023 0832 Last data filed at 06/04/2023 5284 Gross per 24 hour  Intake 100.1 ml  Output 150 ml  Net -49.9 ml    Labs/Imaging Results  for orders placed or performed during the hospital encounter of 06/04/23 (from the past 48 hour(s))  Basic metabolic panel     Status: Abnormal   Collection Time: 06/04/23  1:33 AM  Result Value Ref Range   Sodium 136 135 - 145 mmol/L   Potassium 4.2 3.5 - 5.1 mmol/L   Chloride 104 98 - 111 mmol/L   CO2 21 (L) 22 - 32 mmol/L   Glucose, Bld 115 (H) 70 - 99 mg/dL    Comment: Glucose reference range applies only to samples taken after fasting for at least 8 hours.   BUN 13 6 - 20 mg/dL   Creatinine, Ser 1.61 0.61 -  1.24 mg/dL   Calcium 8.8 (L) 8.9 - 10.3 mg/dL   GFR, Estimated >09 >60 mL/min    Comment: (NOTE) Calculated using the CKD-EPI Creatinine Equation (2021)    Anion gap 11 5 - 15    Comment: Performed at [facility], [address], [place], [place] [address]  CBC     Status: Abnormal   Collection Time: 06/04/23  1:33 AM  Result Value Ref Range   WBC 18.8 (H) 4.0 - 10.5 K/uL   RBC 3.66 (L) 4.22 - 5.81 MIL/uL   Hemoglobin 10.9 (L) 13.0 - 17.0 g/dL   HCT 81.1 (L) 91.4 - 78.2 %   MCV 95.4 80.0 - 100.0 fL   MCH 29.8 26.0 - 34.0 pg   MCHC 31.2 30.0 - 36.0 g/dL   RDW 95.6 21.3 - 08.6 %   Platelets 606 (H) 150 - 400 K/uL   nRBC 0.0 0.0 - 0.2 %    Comment: Performed at [facility], [address] [place], [place] [address]  Troponin I (High Sensitivity)     Status: Abnormal   Collection Time: 06/04/23  1:33 AM  Result Value Ref Range   Troponin I (High Sensitivity) 19 (H) <18 ng/L    Comment: (NOTE) Elevated high sensitivity troponin I (hsTnI) values and significant  changes across serial measurements may suggest ACS but many other  chronic and acute conditions are known to elevate hsTnI results.  Refer to the "Links" section for chest pain algorithms and additional  guidance. Performed at [facility], [address], [place], [place] [address]   Brain natriuretic peptide     Status: Abnormal   Collection Time: 06/04/23  1:35 AM  Result Value Ref Range   B Natriuretic Peptide 1,188.2 (H) 0.0 - 100.0 pg/mL    Comment: Performed at [facility], [address], [place], [place] [address]  Lactic acid, plasma     Status: None   Collection Time: 06/04/23  3:35 AM  Result Value Ref Range   Lactic Acid, Venous 1.5 0.5 - 1.9 mmol/L    Comment: Performed at [facility], [address], [place], [place] [address]  Troponin I (High Sensitivity)     Status: Abnormal   Collection Time: 06/04/23  3:35 AM  Result Value Ref Range   Troponin I (High  Sensitivity) 19 (H) <18 ng/L    Comment: (NOTE) Elevated high sensitivity troponin I (hsTnI) values and significant  changes across serial measurements may suggest ACS but many other  chronic and acute conditions are known to elevate hsTnI results.  Refer to the "Links" section for chest pain algorithms and additional  guidance. Performed at [facility], [address] [place], [place] [address]    CT Angio Chest PE  W and/or Wo Contrast  Result Date: 06/04/2023 CLINICAL DATA:  52 year old male with history of chest pain. Suspected pulmonary embolism. EXAM: CT ANGIOGRAPHY CHEST WITH CONTRAST TECHNIQUE: Multidetector CT imaging of the chest was performed using the standard protocol during bolus administration of intravenous contrast. Multiplanar CT image reconstructions and MIPs were obtained to evaluate the vascular anatomy. RADIATION DOSE REDUCTION: This exam was performed according to the departmental dose-optimization program which includes automated exposure control, adjustment of the mA and/or kV according to patient size and/or use of iterative reconstruction technique. CONTRAST:  75mL OMNIPAQUE IOHEXOL 350 MG/ML SOLN COMPARISON:  Chest CT 05/30/2023. FINDINGS: Cardiovascular: There are no filling defects in the pulmonary arterial tree to suggest pulmonary embolism. Heart size is enlarged with left ventricular dilatation. There is no significant pericardial fluid, thickening or pericardial calcification. No atherosclerotic calcifications are noted in the thoracic aorta or the coronary arteries. Mediastinum/Nodes: Multiple prominent borderline enlarged mediastinal and hilar lymph nodes, nonspecific, but likely reactive. Esophagus is unremarkable in appearance. No axillary lymphadenopathy. Lungs/Pleura: Worsening patchy areas of airspace consolidation are noted throughout the lungs bilaterally, most evident in the mid to lower lungs, particularly in the lower lobes of the lungs where there  is extensive ground-glass attenuation, peribronchovascular consolidation in more dense areas of mass-like consolidation, indicative of progressive multilobar bilateral pneumonia. Trace bilateral pleural effusions are also noted. Upper Abdomen: Unremarkable. Musculoskeletal: There are no aggressive appearing lytic or blastic lesions noted in the visualized portions of the skeleton. Review of the MIP images confirms the above findings. IMPRESSION: 1. No evidence of pulmonary embolism. 2. Progressive multilobar bilateral pneumonia, most severe in the lower lobes of the lungs bilaterally. 3. Trace bilateral parapneumonic pleural effusions. 4. Cardiomegaly with left ventricular dilatation. Electronically Signed   By: Trudie Reed M.D.   On: 06/04/2023 05:46   DG Chest Portable 1 View  Result Date: 06/04/2023 CLINICAL DATA:  52 year old male with signs and symptoms of pneumonia. EXAM: PORTABLE CHEST 1 VIEW COMPARISON:  Chest x-ray 05/30/2023. FINDINGS: Lung volumes are slightly low. Extensive opacities throughout the mid to lower lungs bilaterally indicative of areas of atelectasis and/or consolidation, with superimposed small bilateral pleural effusions. No pneumothorax. No evidence of pulmonary edema. Heart size is mildly enlarged. The patient is rotated to the left on today's exam, resulting in distortion of the mediastinal contours and reduced diagnostic sensitivity and specificity for mediastinal pathology. IMPRESSION: 1. Low lung volumes with patchy areas of atelectasis and/or consolidation throughout the mid to lower lungs bilaterally and superimposed small bilateral pleural effusions. Electronically Signed   By: Trudie Reed M.D.   On: 06/04/2023 05:42    Pending Labs Unresulted Labs (From admission, onward)     Start     Ordered   06/05/23 0500  Basic metabolic panel  Tomorrow morning,   R        06/04/23 0810   06/05/23 0500  CBC  Tomorrow morning,   R        06/04/23 0810   06/04/23 0113   Lactic acid, plasma  (Lactic Acid)  Now then every 2 hours,   R (with STAT occurrences)      06/04/23 0112            Vitals/Pain Today's Vitals   06/04/23 0615 06/04/23 0630 06/04/23 0700 06/04/23 0805  BP: 115/70 112/80 117/82 107/74  Pulse:    (!) 105  Resp: (!) 30 (!) 34 (!) 30 (!) 22  Temp:    97.7 F (36.5 C)  TempSrc:    Oral  SpO2:    (!) 23%  Height:      PainSc:        Isolation Precautions No active isolations  Medications Medications  acetaminophen (TYLENOL) tablet 650 mg (has no administration in time range)    Or  acetaminophen (TYLENOL) suppository 650 mg (has no administration in time range)  albuterol (PROVENTIL) (2.5 MG/3ML) 0.083% nebulizer solution 2.5 mg (has no administration in time range)  hydrALAZINE (APRESOLINE) injection 10 mg (has no administration in time range)  enoxaparin (LOVENOX) injection 40 mg (has no administration in time range)  oxyCODONE (Oxy IR/ROXICODONE) immediate release tablet 5 mg (has no administration in time range)  morphine (PF) 2 MG/ML injection 2 mg (has no administration in time range)  vancomycin (VANCOREADY) IVPB 1500 mg/300 mL (0 mg Intravenous Stopped 06/04/23 0444)  ceFEPIme (MAXIPIME) 2 g in sodium chloride 0.9 % 100 mL IVPB (0 g Intravenous Stopped 06/04/23 0210)  fentaNYL (SUBLIMAZE) injection 50 mcg (50 mcg Intravenous Given 06/04/23 0332)  iohexol (OMNIPAQUE) 350 MG/ML injection 75 mL (75 mLs Intravenous Contrast Given 06/04/23 0320)  HYDROmorphone (DILAUDID) injection 0.5 mg (0.5 mg Intravenous Given 06/04/23 0523)  HYDROmorphone (DILAUDID) injection 1 mg (1 mg Intravenous Given 06/04/23 0800)    Mobility walks with device     Focused Assessments Cardiac Assessment Handoff:  Cardiac Rhythm: Sinus tachycardia No results found for: "CKTOTAL", "CKMB", "CKMBINDEX", "TROPONINI" No results found for: "DDIMER" Does the Patient currently have chest pain? No   , Neuro Assessment Handoff:  Swallow screen  pass? Yes  Cardiac Rhythm: Sinus tachycardia       Neuro Assessment:   Neuro Checks:      Has TPA been given? No If patient is a Neuro Trauma and patient is going to OR before floor call report to 4N Charge nurse: [phone] or [phone]  , Pulmonary Assessment Handoff:  Lung sounds: L Breath Sounds: Diminished, Inspiratory wheezes R Breath Sounds: Diminished, Inspiratory wheezes O2 Device: Nasal Cannula O2 Flow Rate (L/min): 5 L/min    R Recommendations: See Admitting Provider Note  Report given to:   Additional Notes: pt uses cane which is at bedside, breathing improves when pain is controlled

## 2023-06-04 NOTE — Progress Notes (Signed)
MEWS Progress Note  Patient Details Name: CHAEL DEVILLIER MRN: [id number] DOB: 09-04-1970 Today's Date: 06/04/2023   MEWS Flowsheet Documentation:  Assess: MEWS Score Temp: 98.4 F (36.9 C) BP: (!) 132/94 MAP (mmHg): 105 Pulse Rate: (!) 107 ECG Heart Rate: (!) 113 Resp: 18 Level of Consciousness: Alert SpO2: 100 % O2 Device: Nasal Cannula O2 Flow Rate (L/min): 4.5 L/min FiO2 (%): 100 % Assess: MEWS Score MEWS Temp: 0 MEWS Systolic: 0 MEWS Pulse: 1 MEWS RR: 0 MEWS LOC: 0 MEWS Score: 1 MEWS Score Color: Green Assess: SIRS CRITERIA SIRS Temperature : 0 SIRS Respirations : 0 SIRS Pulse: 1 SIRS WBC: 1 SIRS Score Sum : 2 SIRS Temperature : 0 SIRS Pulse: 1 SIRS Respirations : 0 SIRS WBC: 1 SIRS Score Sum : 2        Santiago Bumpers 06/04/2023, 3:29 PM

## 2023-06-04 NOTE — H&P (Addendum)
[facility] History and Physical  LISANDRO CURTISS NWG:[id number] DOB: 1970/09/01 DOA: 06/04/2023 PCP: Rema Fendt, NP  Presented from: Homeless Chief Complaint: Chest pain, shortness of breath  History of Present Illness: KENDERICK KETELSEN is a 52 y.o. male with PMH significant for homelessness for 30 years, polysubstance abuse (cocaine, alcohol), medical noncompliance, HTN, HLD, stroke, CAD/STEMI, cardiomyopathy, chronic systolic CHF, CKD who was recently hospitalized 11/2 for pneumonia and left AMA on 11/8.  During that hospitalization, there was also suspicion of stroke but neurology reviewed imaging and deemed it as an artifact.  Social worker while in process of finding a SNF for him but patient decided to leave AMA. At the time of discharge, patient was given prescription for oral doxycycline as a courtesy.   Last night, patient was brought to ED by EMS with complaint of chest pain that started around 1 PM in the afternoon.  Patient used to be transported to hospital at the time.  He continued to chest pain and also associated with shortness of breath, chest tightness and called EMS again in the night to be brought to the hospital. With EMS, his vital signs were stable, CBG 181 En route to the ED, he was given aspirin 324 milligram and nitroglycerin.  In the ED, he was afebrile, tachycardic to low 100s, blood pressure in 130s, tachypneic to 30s, O2 sat down to 80% at one point requiring 15 L oxygen by nasal cannula. Initial labs with WBC count 18.8, hemoglobin 10.9, BNP more than 1100, troponin mildly elevated to 19, Renal function normal, lactic acid level normal CT angio chest did not show any evidence of pulm embolism.  It showed progressive multilobar bilateral pneumonia, most severe in the lower lobes of the lungs bilaterally. Patient was started on IV cefepime, IV vancomycin Hospitalist service was consulted I received this patient as a carryover admission from last  night Overnight, patient remained tachycardic, tachypneic to 20s and 30s, O2 requirement, on 5 L this morning  At the time of my evaluation this morning, patient was lying on bed.  Breathing was shallow because of pain on deep breathing.  Complains of rib pain every time he takes a deep breath.  Tachypneic but maintaining saturation on 5 L oxygen. States he has not been able to take his medications including doxycycline given last week at discharge. Has bilateral lower extremity edema. Family not at bedside.  Review of Systems:  All systems were reviewed and were negative unless otherwise mentioned in the HPI   Past medical history: Past Medical History:  Diagnosis Date   Alcohol abuse    Asthma    Brain tumor (HCC)    CAD (coronary artery disease)    Chronic HFrEF (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)    Chronic kidney disease, stage 3 (HCC)    Cocaine abuse (HCC)    History of medication noncompliance    Homelessness    MI (myocardial infarction) (HCC)    STEMI (ST elevation myocardial infarction) (HCC)    Stroke [facility])     Past surgical history: Past Surgical History:  Procedure Laterality Date   BUBBLE STUDY  09/11/2021   Procedure: BUBBLE STUDY;  Surgeon: Pricilla Riffle, MD;  Location: [facility] ENDOSCOPY;  Service: Cardiovascular;;   LEFT HEART CATH AND CORONARY ANGIOGRAPHY N/A 12/19/2021   Procedure: LEFT HEART CATH AND CORONARY ANGIOGRAPHY;  Surgeon: Lyn Records, MD;  Location: [facility] INVASIVE CV LAB;  Service: Cardiovascular;  Laterality: N/A;   RIGHT/LEFT HEART  CATH AND CORONARY ANGIOGRAPHY N/A 04/13/2022   Procedure: RIGHT/LEFT HEART CATH AND CORONARY ANGIOGRAPHY;  Surgeon: Laurey Morale, MD;  Location: [facility] INVASIVE CV LAB;  Service: Cardiovascular;  Laterality: N/A;   TEE WITHOUT CARDIOVERSION N/A 09/11/2021   Procedure: TRANSESOPHAGEAL ECHOCARDIOGRAM (TEE);  Surgeon: Pricilla Riffle, MD;  Location: [facility] ENDOSCOPY;  Service: Cardiovascular;  Laterality: N/A;     Social History:  reports that he has been smoking cigarettes. He has a 41 pack-year smoking history. He has been exposed to tobacco smoke. He has never used smokeless tobacco. He reports current alcohol use of about 63.0 standard drinks of alcohol per week. He reports current drug use. Drugs: "Crack" cocaine and Cocaine.  Allergies:  Allergies  Allergen Reactions   Shellfish Allergy Anaphylaxis   Peanut-Containing Drug Products Itching   Shellfish allergy and Peanut-containing drug products   Family history:  Family History  Problem Relation Age of Onset   Diabetes Mother      Physical Exam: Vitals:   06/04/23 0835 06/04/23 0840 06/04/23 0943 06/04/23 1000  BP: 121/77  (!) 125/98   Pulse: (!) 113 (!) 110 (!) 109   Resp: (!) 23 (!) 30 20   Temp:   97.7 F (36.5 C)   TempSrc:   Oral   SpO2: 90% 100% 99% 99%  Weight:   71.3 kg   Height:   5\' 7"  (1.702 m)    Wt Readings from Last 3 Encounters:  06/04/23 71.3 kg  05/25/23 68.9 kg  03/07/23 68.9 kg   Body mass index is 24.62 kg/m.  General exam: Middle-aged African-American male. Disheveled look. Skin: No rashes, lesions or ulcers. HEENT: Atraumatic, normocephalic, no obvious bleeding Lungs: Shallow breathing.  Complains of pain on deep breathing.  Mild tenderness on the right lateral ribs CVS: Regular tachycardia, no murmur GI/Abd soft, nontender, nondistended, bowel sound present CNS: Alert, awake, able to orientation questions Psychiatry: Sad affect Extremities: 1+ bilateral pedal edema   ------------------------------------------------------------------------------------------------------ Assessment/Plan: Principal Problem:   Pneumonia  Sepsis POA Multifocal pneumonia Recently hospitalized with pneumonia and left AMA without completion of treatment. Presented with progressive shortness of breath No fever tachycardic, tachypneic, has leukocytosis.  Pending procalcitonin level Chest imaging with  progressive multilobar bilateral pneumonia most severe in the lower lobes bilaterally I do not see any blood culture sent last night before antibiotics were given I will order blood culture to be collected now Also continue cefepime and vancomycin Continue to monitor Recent Labs  Lab 05/29/23 1323 05/30/23 1130 05/31/23 0749 06/04/23 0133 06/04/23 0335  WBC 10.3 9.9  --  18.8*  --   LATICACIDVEN  --   --   --   --  1.5  PROCALCITON  --   --  0.46  --   --    Acute on chronic systolic CHF Cardiomyopathy Presented with shortness of breath, bilateral lower extremity edema BNP elevated more than 1100 Echocardiogram 11/4 showed EF less than 20%, severely decreased function, global hypokinesis, grade 2 diastolic dysfunction, moderate to severely dilated LA.  EF is lowered last echo from May 2024 that showed 25 to 30%. PTA meds-he has been noncompliant to GDMT with Coreg, Jardiance, metolazone, Imdur, Lasix I will start with Lasix IV 40 mg and resume Coreg 3.125 mg twice daily.  Acute respiratory failure with hypoxia Chest wall pain In the setting of multifocal pneumonia and shallow breathing due to pleuritic chest wall pain likely due to pneumonia.  No evidence of rib fractures in imagings. Was not  on supplemental oxygen at baseline. Required up to 15 liters last night..  Currently on 5 L this morning.  Wean down as tolerated.  For pain control, ordered lidocaine patch, as needed oral and IV opiates.  In last admission, he had shown a suspicious preference to IV opiates.  I had made it clear to him that IV opiate is only for 24 to 48 hours I have discussed with bedside nurse and charge nurse to monitor his respiratory status.  Bedside pulse oximetry ordered.  If he continues to worsen, he may need to be transferred to progressive care.  H/o CAD/STEMI Noncompliant to Plavix and statin Resume at this time.  H/o stroke Patient has mild chronic right upper extremity weakness from prior  strokes. Recent hospitalization, imagings raised suspicion of stroke but neurology reviewed the imagings and deemed them to be artifact. In the past, patient was on anticoagulation with Coumadin but he was not compliant to it. Continue Plavix and statin   CKD 2 Creatinine at baseline Recent Labs    12/02/22 0033 12/03/22 0039 12/04/22 0047 01/23/23 1522 03/07/23 1202 05/25/23 1339 05/25/23 2032 05/26/23 0455 05/29/23 1323 05/31/23 0749 06/04/23 0133  BUN 29* 32* 36* 22* 20 22*  --  18 19 13 13   CREATININE 1.95* 2.16* 2.02* 1.70* 1.80* 1.48* 1.53* 1.32* 1.48* 1.32* 1.22   Homelessness Social work to follow-up  polysubstance abuse (cocaine, alcohol) Counseled to quit. Last alcohol drink was prior to presentation yesterday. Watch out for withdrawal symptoms.  Mobility: Encourage ambulation  Goals of care   Code Status: Full Code    DVT prophylaxis:  enoxaparin (LOVENOX) injection 40 mg Start: 06/04/23 1000   Antimicrobials: IV cefepime and IV vancomycin Fluid: None Consultants: None Family Communication: None at bedside  Dispo: The patient is from: Homeless              Anticipated d/c is to: Probably back to homeless shelter versus SNF  Diet: Diet Order             Diet 2 gram sodium Room service appropriate? Yes; Fluid consistency: Thin  Diet effective now                    ------------------------------------------------------------------------------------- Severity of Illness: The appropriate patient status for this patient is OBSERVATION. Observation status is judged to be reasonable and necessary in order to provide the required intensity of service to ensure the patient's safety. The patient's presenting symptoms, physical exam findings, and initial radiographic and laboratory data in the context of their medical condition is felt to place them at decreased risk for further clinical deterioration. Furthermore, it is anticipated that the patient  will be medically stable for discharge from the hospital within 2 midnights of admission.  -------------------------------------------------------------------------------------   Home Meds: Prior to Admission medications   Medication Sig Start Date End Date Taking? Authorizing Provider  apixaban (ELIQUIS) 5 MG TABS tablet Take 1 tablet (5 mg total) by mouth 2 (two) times daily. 02/05/23  Yes Milford, Anderson Malta, [name]  atorvastatin (LIPITOR) 40 MG tablet Take 1 tablet (40 mg total) by mouth daily. 02/05/23  Yes Milford, Anderson Malta, [name]  Blood Pressure Monitoring (OMRON 3 SERIES BP MONITOR) DEVI Use as directed Patient not taking: Reported on 03/07/2023 02/05/23     carvedilol (COREG) 3.125 MG tablet Take 1 tablet (3.125 mg total) by mouth 2 (two) times daily. Patient not taking: Reported on 03/07/2023 02/05/23   Jacklynn Ganong, [name]  clopidogrel (PLAVIX) 75  MG tablet Take 1 tablet (75 mg total) by mouth daily. Patient not taking: Reported on 05/25/2023 02/05/23   Jacklynn Ganong, [name]  doxycycline (VIBRA-TABS) 100 MG tablet Take 1 tablet (100 mg total) by mouth every 12 (twelve) hours for 5 days. 05/31/23 06/05/23  Narda Bonds, MD  empagliflozin (JARDIANCE) 10 MG TABS tablet Take 1 tablet (10 mg total) by mouth daily. Patient not taking: Reported on 05/25/2023 12/04/22   Zannie Cove, MD  furosemide (LASIX) 40 MG tablet Take 2 tablets (80 mg total) by mouth daily. Patient not taking: Reported on 05/25/2023 02/05/23 05/25/23  Jacklynn Ganong, [name]  isosorbide-hydrALAZINE (BIDIL) 20-37.5 MG tablet Take 1/2 tablet by mouth 3 (three) times daily. Patient not taking: Reported on 05/25/2023 01/23/23   Jacklynn Ganong, [name]  metolazone (ZAROXOLYN) 2.5 MG tablet Take 1 tablet (2.5 mg total) by mouth as directed. Patient not taking: Reported on 03/07/2023 01/23/23 04/23/23  Jacklynn Ganong, [name]  potassium chloride SA (KLOR-CON M) 20 MEQ tablet Take 2 tablets (40 mEq total) by mouth daily. Patient not  taking: Reported on 03/07/2023 02/05/23   Jacklynn Ganong, [name]  triamcinolone ointment (KENALOG) 0.5 % Apply 1 Application topically 2 (two) times daily. Patient not taking: Reported on 05/25/2023 03/06/23   Rema Fendt, NP    Labs on Admission:   CBC: Recent Labs  Lab 05/29/23 1323 05/30/23 1130 06/04/23 0133  WBC 10.3 9.9 18.8*  HGB 9.7* 9.5* 10.9*  HCT 29.5* 29.0* 34.9*  MCV 92.8 93.9 95.4  PLT 276 330 606*    Basic Metabolic Panel: Recent Labs  Lab 05/29/23 1323 05/31/23 0749 06/04/23 0133  NA 137 137 136  K 3.9 3.6 4.2  CL 104 107 104  CO2 21* 20* 21*  GLUCOSE 115* 103* 115*  BUN 19 13 13   CREATININE 1.48* 1.32* 1.22  CALCIUM 8.2* 8.1* 8.8*    Liver Function Tests: Recent Labs  Lab 05/29/23 1323  AST 26  ALT 11  ALKPHOS 84  BILITOT 0.7  PROT 5.5*  ALBUMIN 1.9*   No results for input(s): "LIPASE", "AMYLASE" in the last 168 hours. No results for input(s): "AMMONIA" in the last 168 hours.  Cardiac Enzymes: No results for input(s): "CKTOTAL", "CKMB", "CKMBINDEX", "TROPONINI" in the last 168 hours.  BNP (last 3 results) Recent Labs    03/07/23 1202 05/25/23 1439 06/04/23 0135  BNP 2,990.6* 1,346.6* 1,188.2*    ProBNP (last 3 results) No results for input(s): "PROBNP" in the last 8760 hours.  CBG: No results for input(s): "GLUCAP" in the last 168 hours.  Lipase     Component Value Date/Time   LIPASE 22 11/20/2017 0038     Urinalysis    Component Value Date/Time   COLORURINE YELLOW 05/29/2023 1059   APPEARANCEUR CLEAR 05/29/2023 1059   LABSPEC 1.030 05/29/2023 1059   PHURINE 6.0 05/29/2023 1059   GLUCOSEU >=500 (A) 05/29/2023 1059   HGBUR NEGATIVE 05/29/2023 1059   BILIRUBINUR NEGATIVE 05/29/2023 1059   KETONESUR NEGATIVE 05/29/2023 1059   PROTEINUR 30 (A) 05/29/2023 1059   UROBILINOGEN 0.2 05/14/2015 0845   NITRITE NEGATIVE 05/29/2023 1059   LEUKOCYTESUR NEGATIVE 05/29/2023 1059     Drugs of Abuse     Component Value  Date/Time   LABOPIA NONE DETECTED 05/26/2023 0140   COCAINSCRNUR POSITIVE (A) 05/26/2023 0140   COCAINSCRNUR See Final Results 12/20/2021 0028   LABBENZ NONE DETECTED 05/26/2023 0140   AMPHETMU NONE DETECTED 05/26/2023 0140   THCU POSITIVE (A) 05/26/2023  0140   LABBARB NONE DETECTED 05/26/2023 0140      Radiological Exams on Admission: CT Angio Chest PE W and/or Wo Contrast  Result Date: 06/04/2023 CLINICAL DATA:  52 year old male with history of chest pain. Suspected pulmonary embolism. EXAM: CT ANGIOGRAPHY CHEST WITH CONTRAST TECHNIQUE: Multidetector CT imaging of the chest was performed using the standard protocol during bolus administration of intravenous contrast. Multiplanar CT image reconstructions and MIPs were obtained to evaluate the vascular anatomy. RADIATION DOSE REDUCTION: This exam was performed according to the departmental dose-optimization program which includes automated exposure control, adjustment of the mA and/or kV according to patient size and/or use of iterative reconstruction technique. CONTRAST:  75mL OMNIPAQUE IOHEXOL 350 MG/ML SOLN COMPARISON:  Chest CT 05/30/2023. FINDINGS: Cardiovascular: There are no filling defects in the pulmonary arterial tree to suggest pulmonary embolism. Heart size is enlarged with left ventricular dilatation. There is no significant pericardial fluid, thickening or pericardial calcification. No atherosclerotic calcifications are noted in the thoracic aorta or the coronary arteries. Mediastinum/Nodes: Multiple prominent borderline enlarged mediastinal and hilar lymph nodes, nonspecific, but likely reactive. Esophagus is unremarkable in appearance. No axillary lymphadenopathy. Lungs/Pleura: Worsening patchy areas of airspace consolidation are noted throughout the lungs bilaterally, most evident in the mid to lower lungs, particularly in the lower lobes of the lungs where there is extensive ground-glass attenuation, peribronchovascular consolidation  in more dense areas of mass-like consolidation, indicative of progressive multilobar bilateral pneumonia. Trace bilateral pleural effusions are also noted. Upper Abdomen: Unremarkable. Musculoskeletal: There are no aggressive appearing lytic or blastic lesions noted in the visualized portions of the skeleton. Review of the MIP images confirms the above findings. IMPRESSION: 1. No evidence of pulmonary embolism. 2. Progressive multilobar bilateral pneumonia, most severe in the lower lobes of the lungs bilaterally. 3. Trace bilateral parapneumonic pleural effusions. 4. Cardiomegaly with left ventricular dilatation. Electronically Signed   By: Trudie Reed M.D.   On: 06/04/2023 05:46   DG Chest Portable 1 View  Result Date: 06/04/2023 CLINICAL DATA:  52 year old male with signs and symptoms of pneumonia. EXAM: PORTABLE CHEST 1 VIEW COMPARISON:  Chest x-ray 05/30/2023. FINDINGS: Lung volumes are slightly low. Extensive opacities throughout the mid to lower lungs bilaterally indicative of areas of atelectasis and/or consolidation, with superimposed small bilateral pleural effusions. No pneumothorax. No evidence of pulmonary edema. Heart size is mildly enlarged. The patient is rotated to the left on today's exam, resulting in distortion of the mediastinal contours and reduced diagnostic sensitivity and specificity for mediastinal pathology. IMPRESSION: 1. Low lung volumes with patchy areas of atelectasis and/or consolidation throughout the mid to lower lungs bilaterally and superimposed small bilateral pleural effusions. Electronically Signed   By: Trudie Reed M.D.   On: 06/04/2023 05:42     Signed, Lorin Glass, MD [facility] 06/04/2023

## 2023-06-04 NOTE — ED Notes (Signed)
Patient transported to CT 

## 2023-06-04 NOTE — ED Notes (Signed)
RT informed this RN that pt refusing BiPap at this time. RT placed pt back on NRB.

## 2023-06-04 NOTE — ED Notes (Signed)
Pt placed on NRB. MD notified.

## 2023-06-04 NOTE — Progress Notes (Signed)
MEWS Progress Note  Patient Details Name: ABDI ROLLE MRN: [id number] DOB: 1971-03-20 Today's Date: 06/04/2023   MEWS Flowsheet Documentation:  Assess: MEWS Score Temp: (!) 97.3 F (36.3 C) BP: 130/88 MAP (mmHg): 100 Pulse Rate: 98 ECG Heart Rate: (!) 113 Resp: (!) 22 Level of Consciousness: Alert SpO2: 100 % O2 Device: Nasal Cannula O2 Flow Rate (L/min): 4.5 L/min FiO2 (%): 100 % Assess: MEWS Score MEWS Temp: 0 MEWS Systolic: 0 MEWS Pulse: 0 MEWS RR: 1 MEWS LOC: 0 MEWS Score: 1 MEWS Score Color: Green Assess: SIRS CRITERIA SIRS Temperature : 0 SIRS Respirations : 1 SIRS Pulse: 1 SIRS WBC: 0 SIRS Score Sum : 2 SIRS Temperature : 0 SIRS Pulse: 1 SIRS Respirations : 0 SIRS WBC: 1 SIRS Score Sum : 2 Assess: if the MEWS score is Yellow or Red Were vital signs accurate and taken at a resting state?: Yes        Santiago Bumpers 06/04/2023, 5:32 PM

## 2023-06-04 NOTE — ED Notes (Signed)
Pt becoming increasingly anxious at this time. EDP made aware.

## 2023-06-05 DIAGNOSIS — Z7409 Other reduced mobility: Secondary | ICD-10-CM | POA: Diagnosis present

## 2023-06-05 DIAGNOSIS — Z6824 Body mass index (BMI) 24.0-24.9, adult: Secondary | ICD-10-CM | POA: Diagnosis not present

## 2023-06-05 DIAGNOSIS — Z79899 Other long term (current) drug therapy: Secondary | ICD-10-CM | POA: Diagnosis not present

## 2023-06-05 DIAGNOSIS — F1410 Cocaine abuse, uncomplicated: Secondary | ICD-10-CM | POA: Diagnosis present

## 2023-06-05 DIAGNOSIS — I429 Cardiomyopathy, unspecified: Secondary | ICD-10-CM | POA: Diagnosis present

## 2023-06-05 DIAGNOSIS — F1010 Alcohol abuse, uncomplicated: Secondary | ICD-10-CM | POA: Diagnosis present

## 2023-06-05 DIAGNOSIS — I130 Hypertensive heart and chronic kidney disease with heart failure and stage 1 through stage 4 chronic kidney disease, or unspecified chronic kidney disease: Secondary | ICD-10-CM | POA: Diagnosis present

## 2023-06-05 DIAGNOSIS — I252 Old myocardial infarction: Secondary | ICD-10-CM | POA: Diagnosis not present

## 2023-06-05 DIAGNOSIS — F17210 Nicotine dependence, cigarettes, uncomplicated: Secondary | ICD-10-CM | POA: Diagnosis present

## 2023-06-05 DIAGNOSIS — R6520 Severe sepsis without septic shock: Secondary | ICD-10-CM | POA: Diagnosis present

## 2023-06-05 DIAGNOSIS — N182 Chronic kidney disease, stage 2 (mild): Secondary | ICD-10-CM | POA: Diagnosis present

## 2023-06-05 DIAGNOSIS — J9601 Acute respiratory failure with hypoxia: Secondary | ICD-10-CM | POA: Diagnosis present

## 2023-06-05 DIAGNOSIS — E43 Unspecified severe protein-calorie malnutrition: Secondary | ICD-10-CM | POA: Diagnosis present

## 2023-06-05 DIAGNOSIS — Z7901 Long term (current) use of anticoagulants: Secondary | ICD-10-CM | POA: Diagnosis not present

## 2023-06-05 DIAGNOSIS — Z5901 Sheltered homelessness: Secondary | ICD-10-CM | POA: Diagnosis not present

## 2023-06-05 DIAGNOSIS — I5023 Acute on chronic systolic (congestive) heart failure: Secondary | ICD-10-CM | POA: Diagnosis present

## 2023-06-05 DIAGNOSIS — J189 Pneumonia, unspecified organism: Secondary | ICD-10-CM | POA: Diagnosis present

## 2023-06-05 DIAGNOSIS — Z7902 Long term (current) use of antithrombotics/antiplatelets: Secondary | ICD-10-CM | POA: Diagnosis not present

## 2023-06-05 DIAGNOSIS — I69331 Monoplegia of upper limb following cerebral infarction affecting right dominant side: Secondary | ICD-10-CM | POA: Diagnosis not present

## 2023-06-05 DIAGNOSIS — A419 Sepsis, unspecified organism: Secondary | ICD-10-CM | POA: Diagnosis present

## 2023-06-05 DIAGNOSIS — E785 Hyperlipidemia, unspecified: Secondary | ICD-10-CM | POA: Diagnosis present

## 2023-06-05 DIAGNOSIS — Z7984 Long term (current) use of oral hypoglycemic drugs: Secondary | ICD-10-CM | POA: Diagnosis not present

## 2023-06-05 DIAGNOSIS — I2510 Atherosclerotic heart disease of native coronary artery without angina pectoris: Secondary | ICD-10-CM | POA: Diagnosis present

## 2023-06-05 DIAGNOSIS — M549 Dorsalgia, unspecified: Secondary | ICD-10-CM | POA: Diagnosis not present

## 2023-06-05 LAB — BASIC METABOLIC PANEL
Anion gap: 7 (ref 5–15)
BUN: 15 mg/dL (ref 6–20)
CO2: 21 mmol/L — ABNORMAL LOW (ref 22–32)
Calcium: 8.5 mg/dL — ABNORMAL LOW (ref 8.9–10.3)
Chloride: 105 mmol/L (ref 98–111)
Creatinine, Ser: 1.3 mg/dL — ABNORMAL HIGH (ref 0.61–1.24)
GFR, Estimated: >60 mL/min (ref >60)
Glucose, Bld: 125 mg/dL — ABNORMAL HIGH (ref 70–99)
Potassium: 4.5 mmol/L (ref 3.5–5.1)
Sodium: 133 mmol/L — ABNORMAL LOW (ref 135–145)

## 2023-06-05 LAB — CBC
HCT: 28.7 % — ABNORMAL LOW (ref 39.0–52.0)
Hemoglobin: 9.1 g/dL — ABNORMAL LOW (ref 13.0–17.0)
MCH: 29.7 pg (ref 26.0–34.0)
MCHC: 31.7 g/dL (ref 30.0–36.0)
MCV: 93.8 fL (ref 80.0–100.0)
Platelets: 552 10*3/uL — ABNORMAL HIGH (ref 150–400)
RBC: 3.06 MIL/uL — ABNORMAL LOW (ref 4.22–5.81)
RDW: 14.4 % (ref 11.5–15.5)
WBC: 16.9 10*3/uL — ABNORMAL HIGH (ref 4.0–10.5)
nRBC: 0 % (ref 0.0–0.2)

## 2023-06-05 MED ORDER — MORPHINE SULFATE (PF) 2 MG/ML IV SOLN: 1.0000 mg | INTRAVENOUS | Status: DC | PRN

## 2023-06-05 NOTE — Progress Notes (Signed)
   06/05/23 2004  BiPAP/CPAP/SIPAP  Reason BIPAP/CPAP not in use Non-compliant (pt refused)

## 2023-06-05 NOTE — Progress Notes (Signed)
PT Cancellation Note  Patient Details Name: ALICK FOLKS MRN: [id number] DOB: 1971/04/02   Cancelled Treatment:    Reason Eval/Treat Not Completed: Patient declined, no reason specified  Refused PT evaluation today. York Spaniel he will allow tomorrow. Educated on risks associated with immobility.   Kathlyn Sacramento, PT, DPT [facility]  Rehabilitation Services Physical Therapist Office: [phone] Website: [email or web address]  Berton Mount 06/05/2023, 3:05 PM

## 2023-06-05 NOTE — Progress Notes (Signed)
MEWS Progress Note  Patient Details Name: EWAN CRESSEY MRN: [id number] DOB: 1970/11/04 Today's Date: 06/05/2023   MEWS Flowsheet Documentation:  Assess: MEWS Score Temp: 98.4 F (36.9 C) BP: 108/64 MAP (mmHg): 74 Pulse Rate: (!) 104 ECG Heart Rate: 96 Resp: 17 Level of Consciousness: Alert SpO2: 96 % O2 Device: Room Air O2 Flow Rate (L/min): 4.5 L/min FiO2 (%): 100 % Assess: MEWS Score MEWS Temp: 0 MEWS Systolic: 0 MEWS Pulse: 1 MEWS RR: 0 MEWS LOC: 0 MEWS Score: 1 MEWS Score Color: Green Assess: SIRS CRITERIA SIRS Temperature : 0 SIRS Respirations : 0 SIRS Pulse: 1 SIRS WBC: 0 SIRS Score Sum : 1 SIRS Temperature : 0 SIRS Pulse: 1 SIRS Respirations : 0 SIRS WBC: 1 SIRS Score Sum : 2 Assess: if the MEWS score is Yellow or Red Were vital signs accurate and taken at a resting state?: Yes        Santiago Bumpers 06/05/2023, 11:13 AM

## 2023-06-05 NOTE — H&P (Signed)
PROGRESS NOTE  Craig Schmidt  DOB: Sep 12, 1970  PCP: Rema Fendt, NP ZOX:[id number]  DOA: 06/04/2023  LOS: 0 days  Hospital Day: 2  Brief narrative: Craig Schmidt is a 52 y.o. male with PMH significant for homelessness for 30 years, polysubstance abuse (cocaine, alcohol), medical noncompliance, HTN, HLD, stroke, CAD/STEMI, cardiomyopathy, chronic systolic CHF, CKD who was recently hospitalized 11/2 for pneumonia and left AMA on 11/8.  During that hospitalization, there was also suspicion of stroke but neurology reviewed imaging and deemed it as an artifact.  Social worker while in process of finding a SNF for him but patient decided to leave AMA. At the time of discharge, patient was given prescription for oral doxycycline as a courtesy.   Last night, patient was brought to ED by EMS with complaint of chest pain that started around 1 PM in the afternoon.  Patient used to be transported to hospital at the time.  He continued to chest pain and also associated with shortness of breath, chest tightness and called EMS again in the night to be brought to the hospital. With EMS, his vital signs were stable, CBG 181 En route to the ED, he was given aspirin 324 milligram and nitroglycerin.  In the ED, he was afebrile, tachycardic to low 100s, blood pressure in 130s, tachypneic to 30s, O2 sat down to 80% at one point requiring 15 L oxygen by nasal cannula. Initial labs with WBC count 18.8, hemoglobin 10.9, BNP more than 1100, troponin mildly elevated to 19, Renal function normal, lactic acid level normal CT angio chest did not show any evidence of pulm embolism.  It showed progressive multilobar bilateral pneumonia, most severe in the lower lobes of the lungs bilaterally. Patient was started on IV cefepime, IV vancomycin Admitted to [facility]  Subjective: Patient was seen and examined this morning.  Middle-aged African-American male.  Not in distress.  Still moaning in pain. Significant other at  bedside.  Assessment/Plan: Sepsis POA Multifocal pneumonia Recently hospitalized with pneumonia and left AMA without completion of treatment. Presented with progressive shortness of breath No fever tachycardic, tachypneic, has leukocytosis.  Pending procalcitonin level Chest imaging with progressive multilobar bilateral pneumonia most severe in the lower lobes bilaterally Blood culture sent. Currently on IV cefepime and vancomycin Continue to monitor Recent Labs  Lab 05/30/23 1130 05/31/23 0749 06/04/23 0133 06/04/23 0335 06/04/23 1059 06/05/23 0456  WBC 9.9  --  18.8*  --   --  16.9*  LATICACIDVEN  --   --   --  1.5 1.0  --   PROCALCITON  --  0.46  --  0.14  --   --    Acute on chronic systolic CHF Cardiomyopathy Presented with shortness of breath, bilateral lower extremity edema BNP elevated more than 1100 Echocardiogram 11/4 showed EF less than 20%, severely decreased function, global hypokinesis, grade 2 diastolic dysfunction, moderate to severely dilated LA.  EF on echo from May 2024 that showed 25 to 30%. PTA meds-he has been noncompliant to GDMT with Coreg, Jardiance, metolazone, Imdur, Lasix Given IV Lasix yesterday.  Currently on Coreg 3.125 mg twice daily Heart rate in low 100s, blood pressure in low normal range.  Continue to monitor.  Acute respiratory failure with hypoxia Chest wall pain In the setting of multifocal pneumonia and shallow breathing due to pleuritic chest wall pain likely due to pneumonia.  No evidence of rib fractures in imagings. Was not on supplemental oxygen at baseline. Required up to 15 liters in the  ED..  Currently on 4 L this morning.  Wean down as tolerated.  For pain control, continue lidocaine patch, PRN oxycodone. Minimize the use of as needed IV morphine.  Reduced dose today.  H/o CAD/STEMI Noncompliant to Plavix and statin Resume at this time.  H/o stroke Patient has mild chronic right upper extremity weakness from prior  strokes. Recent hospitalization, imagings raised suspicion of stroke but neurology reviewed the imagings and deemed them to be artifact. In the past, patient was on anticoagulation with Coumadin but he was not compliant to it. Continue Plavix and statin   CKD 2 Creatinine at baseline Recent Labs    12/03/22 0039 12/04/22 0047 01/23/23 1522 03/07/23 1202 05/25/23 1339 05/25/23 2032 05/26/23 0455 05/29/23 1323 05/31/23 0749 06/04/23 0133 06/05/23 0456  BUN 32* 36* 22* 20 22*  --  18 19 13 13 15   CREATININE 2.16* 2.02* 1.70* 1.80* 1.48* 1.53* 1.32* 1.48* 1.32* 1.22 1.30*   Homelessness Social work to follow-up  polysubstance abuse (cocaine, alcohol) Counseled to quit. Last alcohol drink was prior to presentation. Watch out for withdrawal symptoms.  Mobility: Encourage ambulation  Goals of care   Code Status: Full Code    DVT prophylaxis:  enoxaparin (LOVENOX) injection 40 mg Start: 06/04/23 1000   Antimicrobials: IV cefepime and IV vancomycin Fluid: None Consultants: None Family Communication: None at bedside  Status: Observation Level of care:  Telemetry Medical   Patient is from: Homeless Needs to continue in-hospital care: On IV antibiotics, remains in pain Anticipated d/c to: Likely homeless shelter again    Diet:  Diet Order             Diet 2 gram sodium Room service appropriate? Yes; Fluid consistency: Thin  Diet effective now                   Scheduled Meds:  atorvastatin  40 mg Oral Daily   carvedilol  3.125 mg Oral BID   clopidogrel  75 mg Oral Daily   enoxaparin (LOVENOX) injection  40 mg Subcutaneous Daily   folic acid  1 mg Oral Daily   lidocaine  1 patch Transdermal Q24H   multivitamin with minerals  1 tablet Oral Daily   thiamine  100 mg Oral Daily   Or   thiamine  100 mg Intravenous Daily    PRN meds: acetaminophen **OR** acetaminophen, albuterol, hydrALAZINE, LORazepam **OR** LORazepam, morphine injection, oxyCODONE    Infusions:   ceFEPime (MAXIPIME) IV 2 g (06/05/23 0840)   vancomycin 750 mg (06/05/23 1039)    Antimicrobials: Anti-infectives (From admission, onward)    Start     Dose/Rate Route Frequency Ordered Stop   06/04/23 1100  vancomycin (VANCOREADY) IVPB 750 mg/150 mL        750 mg 150 mL/hr over 60 Minutes Intravenous 2 times daily 06/04/23 0919     06/04/23 0930  ceFEPIme (MAXIPIME) 2 g in sodium chloride 0.9 % 100 mL IVPB        2 g 200 mL/hr over 30 Minutes Intravenous Every 8 hours 06/04/23 0915     06/04/23 0115  vancomycin (VANCOREADY) IVPB 1500 mg/300 mL        1,500 mg 150 mL/hr over 120 Minutes Intravenous  Once 06/04/23 0112 06/04/23 0444   06/04/23 0115  ceFEPIme (MAXIPIME) 2 g in sodium chloride 0.9 % 100 mL IVPB        2 g 200 mL/hr over 30 Minutes Intravenous  Once 06/04/23 0112 06/04/23 0210  Objective: Vitals:   06/05/23 0717 06/05/23 1549  BP: 108/64 108/70  Pulse: (!) 104 (!) 105  Resp: 17   Temp: 98.4 F (36.9 C) 98.7 F (37.1 C)  SpO2: 96% 100%    Intake/Output Summary (Last 24 hours) at 06/05/2023 1618 Last data filed at 06/05/2023 0524 Gross per 24 hour  Intake --  Output 50 ml  Net -50 ml   Filed Weights   06/04/23 0943  Weight: 71.3 kg   Weight change:  Body mass index is 24.62 kg/m.   Physical Exam: General exam: Middle-aged African-American male.  Still complains of persistent pain. Skin: No rashes, lesions or ulcers. HEENT: Atraumatic, normocephalic, no obvious bleeding Lungs: Shallow breathing.  Complains of pain on deep breathing.  Mild tenderness on the right lateral ribs CVS: Regular tachycardia, no murmur GI/Abd soft, nontender, nondistended, bowel sound present CNS: Alert, awake, able to orientation questions Psychiatry: Sad affect Extremities: 1+ bilateral pedal edema  Data Review: I have personally reviewed the laboratory data and studies available.  F/u labs ordered Unresulted Labs (From admission, onward)      Start     Ordered   Unscheduled  CBC with Differential/Platelet  Daily,   R     Question:  Specimen collection method  Answer:  Lab=Lab collect   06/05/23 1618   Unscheduled  Basic metabolic panel  Daily,   R     Question:  Specimen collection method  Answer:  Lab=Lab collect   06/05/23 1618            Total time spent in review of labs and imaging, patient evaluation, formulation of plan, documentation and communication with family: 45 minutes  Signed, Lorin Glass, MD [facility] 06/05/2023

## 2023-06-05 NOTE — Progress Notes (Signed)
OT Cancellation Note  Patient Details Name: Craig Schmidt MRN: [id number] DOB: October 27, 1970   Cancelled Treatment:    Reason Eval/Treat Not Completed: Patient declined, no reason specified (Pt declining, "I just cannot do it today." Pt reports he was not able to manage on the streets, unable to figure out how to wear R sling, using a SPC, denies falls.)  Donia Pounds 06/05/2023, 3:07 PM

## 2023-06-06 ENCOUNTER — Telehealth: Payer: Self-pay | Admitting: Family

## 2023-06-06 DIAGNOSIS — J189 Pneumonia, unspecified organism: Secondary | ICD-10-CM | POA: Diagnosis not present

## 2023-06-06 LAB — CBC WITH DIFFERENTIAL/PLATELET
Abs Immature Granulocytes: 0.1 10*3/uL — ABNORMAL HIGH (ref 0.00–0.07)
Basophils Absolute: 0 10*3/uL (ref 0.0–0.1)
Basophils Relative: 0 %
Eosinophils Absolute: 0.3 10*3/uL (ref 0.0–0.5)
Eosinophils Relative: 2 %
HCT: 27.7 % — ABNORMAL LOW (ref 39.0–52.0)
Hemoglobin: 8.7 g/dL — ABNORMAL LOW (ref 13.0–17.0)
Immature Granulocytes: 1 %
Lymphocytes Relative: 8 %
Lymphs Abs: 1.2 10*3/uL (ref 0.7–4.0)
MCH: 28.9 pg (ref 26.0–34.0)
MCHC: 31.4 g/dL (ref 30.0–36.0)
MCV: 92 fL (ref 80.0–100.0)
Monocytes Absolute: 0.7 10*3/uL (ref 0.1–1.0)
Monocytes Relative: 5 %
Neutro Abs: 13.2 10*3/uL — ABNORMAL HIGH (ref 1.7–7.7)
Neutrophils Relative %: 84 %
Platelets: 587 10*3/uL — ABNORMAL HIGH (ref 150–400)
RBC: 3.01 MIL/uL — ABNORMAL LOW (ref 4.22–5.81)
RDW: 14.4 % (ref 11.5–15.5)
WBC: 15.6 10*3/uL — ABNORMAL HIGH (ref 4.0–10.5)
nRBC: 0 % (ref 0.0–0.2)

## 2023-06-06 LAB — BASIC METABOLIC PANEL
Anion gap: 8 (ref 5–15)
BUN: 17 mg/dL (ref 6–20)
CO2: 21 mmol/L — ABNORMAL LOW (ref 22–32)
Calcium: 8.5 mg/dL — ABNORMAL LOW (ref 8.9–10.3)
Chloride: 100 mmol/L (ref 98–111)
Creatinine, Ser: 1.52 mg/dL — ABNORMAL HIGH (ref 0.61–1.24)
GFR, Estimated: 55 mL/min — ABNORMAL LOW (ref >60)
Glucose, Bld: 91 mg/dL (ref 70–99)
Potassium: 4 mmol/L (ref 3.5–5.1)
Sodium: 129 mmol/L — ABNORMAL LOW (ref 135–145)

## 2023-06-06 MED ORDER — OXYCODONE HCL 5 MG PO TABS
5.0000 mg | ORAL_TABLET | Freq: Four times a day (QID) | ORAL | Status: DC | PRN
  Administered 2023-06-06 – 2023-06-10 (×10): 5 mg via ORAL
  Filled 2023-06-06 (×10): qty 1

## 2023-06-06 MED ORDER — SODIUM CHLORIDE 0.9 % IV SOLN
2.0000 g | Freq: Two times a day (BID) | INTRAVENOUS | Status: DC
  Administered 2023-06-06: 2 g via INTRAVENOUS
  Filled 2023-06-06: qty 12.5

## 2023-06-06 NOTE — H&P (Signed)
PROGRESS NOTE  Craig Schmidt  DOB: 1971-02-06  PCP: Craig Fendt, NP ZOX:[id number]  DOA: 06/04/2023  LOS: 1 day  Hospital Day: 3  Brief narrative: Craig Schmidt is a 51 y.o. male with PMH significant for homelessness for 30 years, polysubstance abuse (cocaine, alcohol), medical noncompliance, HTN, HLD, stroke, CAD/STEMI, cardiomyopathy, chronic systolic CHF, CKD who was recently hospitalized 11/2 for pneumonia and left AMA on 11/8.  During that hospitalization, there was also suspicion of stroke but neurology reviewed imaging and deemed it as an artifact.  Social worker while in process of finding a SNF for him but patient decided to leave AMA. At the time of discharge, patient was given prescription for oral doxycycline as a courtesy.   Last night, patient was brought to ED by EMS with complaint of chest pain that started around 1 PM in the afternoon.  Patient used to be transported to hospital at the time.  He continued to chest pain and also associated with shortness of breath, chest tightness and called EMS again in the night to be brought to the hospital. With EMS, his vital signs were stable, CBG 181 En route to the ED, he was given aspirin 324 milligram and nitroglycerin.  In the ED, he was afebrile, tachycardic to low 100s, blood pressure in 130s, tachypneic to 30s, O2 sat down to 80% at one point requiring 15 L oxygen by nasal cannula. Initial labs with WBC count 18.8, hemoglobin 10.9, BNP more than 1100, troponin mildly elevated to 19, Renal function normal, lactic acid level normal CT angio chest did not show any evidence of pulm embolism.  It showed progressive multilobar bilateral pneumonia, most severe in the lower lobes of the lungs bilaterally. Patient was started on IV cefepime, IV vancomycin Admitted to [facility]  Subjective: Patient was seen and examined this morning.   Middle-aged African-American male.  Lying on bed.  When I entered his room, he was on the phone  with his sister.  He was talking within normal patient comfortable voice.  After I entered the room, he hung up the phone.  His voice tone changed, started to breathe shallow and complained that his pain is not adequately controlled. Did not participate with physical therapy yesterday but wants to work with him today. No fever in the last 24 hours.  Assessment/Plan: Sepsis POA Multifocal pneumonia Recently hospitalized with pneumonia and left AMA without completion of treatment. Presented with progressive shortness of breath No fever tachycardic, tachypneic, had leukocytosis and elevated procalcitonin level Chest imaging showed progressive multilobar bilateral pneumonia most severe in the lower lobes bilaterally Blood culture sent. Currently on IV cefepime and IV vancomycin. Continue to monitor. Recent Labs  Lab 05/30/23 1130 05/31/23 0749 06/04/23 0133 06/04/23 0335 06/04/23 1059 06/05/23 0456 06/06/23 0923  WBC 9.9  --  18.8*  --   --  16.9* 15.6*  LATICACIDVEN  --   --   --  1.5 1.0  --   --   PROCALCITON  --  0.46  --  0.14  --   --   --    Acute on chronic systolic CHF Cardiomyopathy Presented with shortness of breath, bilateral lower extremity edema BNP elevated more than 1100 Echocardiogram 11/4 showed EF less than 20%, severely decreased function, global hypokinesis, grade 2 diastolic dysfunction, moderate to severely dilated LA.  EF on echo from May 2024 that showed 25 to 30%. PTA meds-he has been noncompliant to GDMT with Coreg, Jardiance, metolazone, Imdur, Lasix Given IV  Lasix yesterday.   Currently on Coreg 3.125 mg twice daily.  Heart rate 90s, blood pressure in low 100s.  Continue to monitor.  Acute respiratory failure with hypoxia Chest wall pain In the setting of multifocal pneumonia and shallow breathing due to pleuritic chest wall pain likely due to pneumonia.  No evidence of rib fractures in imagings. Was not on supplemental oxygen at baseline. Required up  to 15 liters in the ED..  Gradually weaned down.  Currently on room air. For pain control, continue lidocaine patch. Stop IV morphine.  Minimize the frequency of PRN oxycodone.  H/o CAD/STEMI Noncompliant to Plavix and statin Resume at this time.  H/o stroke Patient has mild chronic right upper extremity weakness from prior strokes. Recent hospitalization, imagings raised suspicion of stroke but neurology reviewed the imagings and deemed them to be artifact. In the past, patient was on anticoagulation with Coumadin but he was not compliant to it. Continue Plavix and statin   CKD 2 Creatinine at baseline Recent Labs    12/03/22 0039 12/04/22 0047 01/23/23 1522 03/07/23 1202 05/25/23 1339 05/25/23 2032 05/26/23 0455 05/29/23 1323 05/31/23 0749 06/04/23 0133 06/05/23 0456  BUN 32* 36* 22* 20 22*  --  18 19 13 13 15   CREATININE 2.16* 2.02* 1.70* 1.80* 1.48* 1.53* 1.32* 1.48* 1.32* 1.22 1.30*   Homelessness Social work to follow-up  polysubstance abuse (cocaine, alcohol) Counseled to quit. Last alcohol drink was prior to presentation. Watch out for withdrawal symptoms.  Mobility: Encourage ambulation  Goals of care   Code Status: Full Code    DVT prophylaxis:  enoxaparin (LOVENOX) injection 40 mg Start: 06/04/23 1000   Antimicrobials: IV cefepime and IV vancomycin Fluid: None Consultants: None Family Communication: None at bedside  Status: Observation Level of care:  Telemetry Medical   Patient is from: Homeless Needs to continue in-hospital care: On IV antibiotics, gradually improving. Anticipated d/c to: Likely homeless shelter again hopefully 1 to 2 days    Diet:  Diet Order             Diet 2 gram sodium Room service appropriate? Yes; Fluid consistency: Thin  Diet effective now                   Scheduled Meds:  atorvastatin  40 mg Oral Daily   carvedilol  3.125 mg Oral BID   clopidogrel  75 mg Oral Daily   enoxaparin (LOVENOX) injection   40 mg Subcutaneous Daily   folic acid  1 mg Oral Daily   lidocaine  1 patch Transdermal Q24H   multivitamin with minerals  1 tablet Oral Daily   thiamine  100 mg Oral Daily   Or   thiamine  100 mg Intravenous Daily    PRN meds: acetaminophen **OR** acetaminophen, albuterol, hydrALAZINE, oxyCODONE   Infusions:   ceFEPime (MAXIPIME) IV 2 g (06/06/23 1003)   vancomycin 750 mg (06/05/23 2117)    Antimicrobials: Anti-infectives (From admission, onward)    Start     Dose/Rate Route Frequency Ordered Stop   06/04/23 1100  vancomycin (VANCOREADY) IVPB 750 mg/150 mL        750 mg 150 mL/hr over 60 Minutes Intravenous 2 times daily 06/04/23 0919     06/04/23 0930  ceFEPIme (MAXIPIME) 2 g in sodium chloride 0.9 % 100 mL IVPB        2 g 200 mL/hr over 30 Minutes Intravenous Every 8 hours 06/04/23 0915     06/04/23 0115  vancomycin (VANCOREADY)  IVPB 1500 mg/300 mL        1,500 mg 150 mL/hr over 120 Minutes Intravenous  Once 06/04/23 0112 06/04/23 0444   06/04/23 0115  ceFEPIme (MAXIPIME) 2 g in sodium chloride 0.9 % 100 mL IVPB        2 g 200 mL/hr over 30 Minutes Intravenous  Once 06/04/23 0112 06/04/23 0210       Objective: Vitals:   06/06/23 0321 06/06/23 0435  BP: 109/78 104/70  Pulse: 96 95  Resp:  18  Temp:  97.9 F (36.6 C)  SpO2:  100%    Intake/Output Summary (Last 24 hours) at 06/06/2023 1058 Last data filed at 06/06/2023 0437 Gross per 24 hour  Intake --  Output 200 ml  Net -200 ml   Filed Weights   06/04/23 0943  Weight: 71.3 kg   Weight change:  Body mass index is 24.62 kg/m.   Physical Exam: General exam: Middle-aged African-American male. Skin: No rashes, lesions or ulcers. HEENT: Atraumatic, normocephalic, no obvious bleeding Lungs: Breathing better.  No crackles.  No wheezing. CVS: Regular rate and rhythm, no murmur GI/Abd soft, nontender, nondistended, bowel sound present CNS: Alert, awake, able to orientation questions Psychiatry: Sad  affect Extremities: 1+ bilateral pedal edema improving  Data Review: I have personally reviewed the laboratory data and studies available.  F/u labs ordered Unresulted Labs (From admission, onward)     Start     Ordered   06/06/23 0500  CBC with Differential/Platelet  Daily,   R     Question:  Specimen collection method  Answer:  Lab=Lab collect   06/05/23 1618   06/06/23 0500  Basic metabolic panel  Daily,   R     Question:  Specimen collection method  Answer:  Lab=Lab collect   06/05/23 1618            Total time spent in review of labs and imaging, patient evaluation, formulation of plan, documentation and communication with family: 45 minutes  Signed, Lorin Glass, MD [facility] 06/06/2023

## 2023-06-06 NOTE — Evaluation (Signed)
Occupational Therapy Evaluation Patient Details Name: Craig Schmidt MRN: [id number] DOB: [birth year] Today's Date: 06/06/2023   History of Present Illness Pt is a 52 y/o M who was recently hospitalized 11/2 for pneumonia and left AMA on 11/8, now presenting with progressive multilobar bilateral pneumonia.PMH includes homelessness for 30 years, polysubstance abuse (cocaine, alcohol), medical noncompliance, HTN, HLD, stroke, CAD/STEMI, cardiomyopathy, chronic systolic CHF, CKD   Clinical Impression   Pt with complaints of pain "all over." Pt needing min assist for bed mobility and CGA to min assist with cane for OOB. He requires min to max assist for ADLs. Pt is homeless. He reports his girlfriend helps him dress. Pt reports impaired visual acuity. Needing assistance to use his cell phone. Difficult to assess if L hand use also contributing as pt attempting to place call in L sidelying. Assisted to place lunch order. Pt declined remaining up in chair at end of session. Patient will benefit from continued inpatient follow up therapy, <3 hours/day       If plan is discharge home, recommend the following: A little help with walking and/or transfers;A lot of help with bathing/dressing/bathroom;Assistance with cooking/housework;Assistance with feeding;Assist for transportation;Help with stairs or ramp for entrance    Functional Status Assessment  Patient has had a recent decline in their functional status and demonstrates the ability to make significant improvements in function in a reasonable and predictable amount of time.  Equipment Recommendations  Other (comment) (defer to next venue)    Recommendations for Other Services       Precautions / Restrictions Precautions Precautions: Fall Precaution Comments: R hemiparesis Restrictions Weight Bearing Restrictions: No      Mobility Bed Mobility Overal bed mobility: Needs Assistance Bed Mobility: Sidelying to Sit, Sit to Sidelying    Sidelying to sit: Min assist     Sit to sidelying: Contact guard assist General bed mobility comments: assist to raise trunk    Transfers Overall transfer level: Needs assistance Equipment used: Straight cane Transfers: Sit to/from Stand Sit to Stand: Contact guard assist           General transfer comment: slow to rise      Balance Overall balance assessment: Needs assistance   Sitting balance-Leahy Scale: Good     Standing balance support: Single extremity supported, During functional activity, Reliant on assistive device for balance Standing balance-Leahy Scale: Fair Standing balance comment: able to maintain static standing balance with close supervision but required up to min assist for dynamic balance                           ADL either performed or assessed with clinical judgement   ADL Overall ADL's : Needs assistance/impaired Eating/Feeding: Minimal assistance Eating/Feeding Details (indicate cue type and reason): assist to open containers and cut food, can self feed with spillage with L hand Grooming: Wash/dry hands;Wash/dry face;Sitting;Supervision/safety   Upper Body Bathing: Moderate assistance   Lower Body Bathing: Maximal assistance   Upper Body Dressing : Moderate assistance   Lower Body Dressing: Maximal assistance   Toilet Transfer: Minimal assistance;Ambulation (cane)           Functional mobility during ADLs: Minimal assistance;Cane       Vision Ability to See in Adequate Light: 1 Impaired Patient Visual Report: No change from baseline Additional Comments: decreased visual acuity when using his phone     Perception         Praxis  Pertinent Vitals/Pain Pain Assessment Pain Assessment: 0-10 Faces Pain Scale: Hurts whole lot Pain Location: "all over" Pain Descriptors / Indicators: Aching, Discomfort, Grimacing, Guarding, Moaning Pain Intervention(s): Monitored during session, Repositioned      Extremity/Trunk Assessment Upper Extremity Assessment Upper Extremity Assessment: Right hand dominant;RUE deficits/detail RUE Deficits / Details: longstanding limitations from prior CVA, + shoulder subluxation, thumb adducted RUE Sensation: decreased light touch RUE Coordination: decreased fine motor;decreased gross motor   Lower Extremity Assessment Lower Extremity Assessment: Defer to PT evaluation   Cervical / Trunk Assessment Cervical / Trunk Assessment: Kyphotic (weakness)   Communication Communication Communication: Difficulty communicating thoughts/reduced clarity of speech Cueing Techniques: Verbal cues   Cognition Arousal: Alert Behavior During Therapy: Flat affect Overall Cognitive Status: No family/caregiver present to determine baseline cognitive functioning Area of Impairment: Safety/judgement, Problem solving                       Following Commands: Follows one step commands consistently, Follows multi-step commands inconsistently Safety/Judgement: Decreased awareness of safety, Decreased awareness of deficits   Problem Solving: Slow processing, Decreased initiation, Difficulty sequencing       General Comments       Exercises     Shoulder Instructions      Home Living Family/patient expects to be discharged to:: Shelter/Homeless   Available Help at Discharge: Other (Comment);Available PRN/intermittently (girlfriend) Type of Home: Homeless                           Additional Comments: reports living in the streets but working with case managers for housing options      Prior Functioning/Environment Prior Level of Function : Independent/Modified Independent             Mobility Comments: pt reports using cane and rollator for mobility, but reports rollator is broken. Pt reports his girlfriend is available intermittently ADLs Comments: reports having assist from girlfrined for dressing tasks        OT Problem List:  Decreased strength;Decreased range of motion;Decreased activity tolerance;Impaired balance (sitting and/or standing);Impaired UE functional use;Pain      OT Treatment/Interventions: Self-care/ADL training;Therapeutic exercise;Energy conservation;DME and/or AE instruction;Therapeutic activities;Patient/family education;Balance training;Neuromuscular education    OT Goals(Current goals can be found in the care plan section) Acute Rehab OT Goals OT Goal Formulation: With patient Time For Goal Achievement: 06/20/23 Potential to Achieve Goals: Good ADL Goals Pt Will Perform Grooming: with supervision;standing Pt Will Perform Upper Body Dressing: with set-up;sitting Pt Will Perform Lower Body Dressing: with min assist;sit to/from stand;sitting/lateral leans Pt Will Transfer to Toilet: with supervision;ambulating;regular height toilet Pt Will Perform Toileting - Clothing Manipulation and hygiene: with supervision;sit to/from stand Additional ADL Goal #1: Pt will complete bed mobility modified independently in preparation for ADLs.  OT Frequency: Min 1X/week    Co-evaluation              AM-PAC OT "6 Clicks" Daily Activity     Outcome Measure Help from another person eating meals?: A Little Help from another person taking care of personal grooming?: A Little Help from another person toileting, which includes using toliet, bedpan, or urinal?: A Lot Help from another person bathing (including washing, rinsing, drying)?: A Lot Help from another person to put on and taking off regular upper body clothing?: A Lot Help from another person to put on and taking off regular lower body clothing?: A Lot 6 Click Score: 14   End of  Session Equipment Utilized During Treatment: Gait belt;Other (comment) (cane)  Activity Tolerance: Patient limited by pain;Patient limited by fatigue Patient left: in bed;with call bell/phone within reach;with bed alarm set  OT Visit Diagnosis: Unsteadiness on feet  (R26.81);Other abnormalities of gait and mobility (R26.89);Muscle weakness (generalized) (M62.81);Hemiplegia and hemiparesis Hemiplegia - Right/Left: Right Hemiplegia - dominant/non-dominant: Dominant Hemiplegia - caused by: Cerebral infarction                Time: 9629-5284 OT Time Calculation (min): 36 min Charges:  OT General Charges $OT Visit: 1 Visit OT Evaluation $OT Eval Moderate Complexity: 1 Mod OT Treatments $Self Care/Home Management : 8-22 mins  Berna Spare, OTR/L Acute Rehabilitation Services Office: [phone]   Evern Bio 06/06/2023, 2:46 PM

## 2023-06-06 NOTE — Telephone Encounter (Signed)
Patient dropped off document  Incontinence Order , to be filled out by provider. Patient requested to send it back via Fax within 5-days. Document is located in providers tray at front office.Please fax at  [phone]

## 2023-06-06 NOTE — Evaluation (Signed)
Physical Therapy Evaluation Patient Details Name: Craig Schmidt MRN: [id number] DOB: 1971/07/16 Today's Date: 06/06/2023  History of Present Illness  Pt is a 52 y/o M who was recently hospitalized 11/2 for pneumonia and left AMA on 11/8, now presenting with progressive multilobar bilateral pneumonia.PMH includes homelessness for 30 years, polysubstance abuse (cocaine, alcohol), medical noncompliance, HTN, HLD, stroke, CAD/STEMI, cardiomyopathy, chronic systolic CHF, CKD  Clinical Impression  Received pt semi-reclined in bed and agreeable to PT evaluation despite pain. Pt required CGA to transition to EOB and supervision to transition back into supine. Stood from EOB with SPC and CGA and ambulated in room with SPC and CGA. Pt limited by fatigue/pain and requested to return to bed. Pt reports he will have intermittent assist form girlfriend and reports using [facility] and rollator prior to admission but states his rollator is currently broken. Recommend continued therapy in lower intensity setting due to current impairments. Acute PT to continue to follow.       If plan is discharge home, recommend the following: Assistance with cooking/housework;Assist for transportation;Help with stairs or ramp for entrance;A little help with walking and/or transfers;A little help with bathing/dressing/bathroom   Can travel by private vehicle   Yes    Equipment Recommendations Wheelchair (measurements PT);Wheelchair cushion (measurements PT) (16x16 WC with foam cushion)  Recommendations for Other Services       Functional Status Assessment Patient has had a recent decline in their functional status and demonstrates the ability to make significant improvements in function in a reasonable and predictable amount of time.     Precautions / Restrictions Precautions Precautions: Fall Precaution Comments: RUE weakness Restrictions Weight Bearing Restrictions: No      Mobility  Bed Mobility Overal bed  mobility: Needs Assistance Bed Mobility: Rolling, Supine to Sit, Sit to Supine Rolling: Supervision   Supine to sit: Min assist, Contact guard Sit to supine: Supervision   General bed mobility comments: HOB elevated and use of bedrails with increased time Patient Response: Cooperative  Transfers Overall transfer level: Needs assistance Equipment used: Straight cane Transfers: Sit to/from Stand Sit to Stand: Contact guard assist           General transfer comment: stood from EOB with SPC and CGA    Ambulation/Gait Ambulation/Gait assistance: Contact guard assist Gait Distance (Feet): 20 Feet Assistive device: Straight cane Gait Pattern/deviations: Step-to pattern, Decreased step length - right, Decreased step length - left, Decreased stride length, Antalgic, Trunk flexed, Narrow base of support Gait velocity: decreased Gait velocity interpretation: <1.31 ft/sec, indicative of household ambulator   General Gait Details: pt demo decreased stride length, narrow BOS, and flexed trunk/downward gaze  Stairs            Wheelchair Mobility     Tilt Bed Tilt Bed Patient Response: Cooperative  Modified Rankin (Stroke Patients Only)       Balance Overall balance assessment: Needs assistance Sitting-balance support: Feet supported, No upper extremity supported Sitting balance-Leahy Scale: Good     Standing balance support: Single extremity supported, During functional activity, Reliant on assistive device for balance (SPC) Standing balance-Leahy Scale: Fair Standing balance comment: able to maintain static standing balance with close supervision but required CGA for dynamic standing balance                             Pertinent Vitals/Pain Pain Assessment Pain Assessment: Faces Faces Pain Scale: Hurts whole lot Pain Location: back Pain Intervention(s): Limited  activity within patient's tolerance, Monitored during session, Premedicated before session,  Repositioned    Home Living Family/patient expects to be discharged to:: Shelter/Homeless   Available Help at Discharge: Other (Comment);Available PRN/intermittently (girlfriend) Type of Home: Homeless             Additional Comments: reports living in the streets but working with case managers for housing options    Prior Function Prior Level of Function : Independent/Modified Independent             Mobility Comments: pt reports using cane and rollator for mobility, but reports rollator is broken. Pt reports his girlfriend is available intermittently       Extremity/Trunk Assessment   Upper Extremity Assessment Upper Extremity Assessment: RUE deficits/detail    Lower Extremity Assessment Lower Extremity Assessment: Generalized weakness    Cervical / Trunk Assessment Cervical / Trunk Assessment: Kyphotic  Communication   Communication Communication: No apparent difficulties Cueing Techniques: Verbal cues  Cognition Arousal: Alert Behavior During Therapy: WFL for tasks assessed/performed, Flat affect Overall Cognitive Status: No family/caregiver present to determine baseline cognitive functioning Area of Impairment: Safety/judgement, Problem solving                       Following Commands: Follows one step commands consistently, Follows multi-step commands inconsistently Safety/Judgement: Decreased awareness of safety   Problem Solving: Slow processing, Decreased initiation, Difficulty sequencing General Comments: pt limited by pain but motivated to "do what you need me to do"        General Comments      Exercises     Assessment/Plan    PT Assessment Patient needs continued PT services  PT Problem List Decreased strength;Decreased balance;Decreased activity tolerance;Decreased mobility;Decreased knowledge of use of DME;Pain;Decreased range of motion;Cardiopulmonary status limiting activity       PT Treatment Interventions DME  instruction;Gait training;Stair training;Functional mobility training;Therapeutic activities;Balance training;Neuromuscular re-education;Patient/family education;Therapeutic exercise    PT Goals (Current goals can be found in the Care Plan section)  Acute Rehab PT Goals Patient Stated Goal: to get better PT Goal Formulation: With patient Time For Goal Achievement: 06/12/23 Potential to Achieve Goals: Fair    Frequency Min 1X/week     Co-evaluation               AM-PAC PT "6 Clicks" Mobility  Outcome Measure Help needed turning from your back to your side while in a flat bed without using bedrails?: A Little Help needed moving from lying on your back to sitting on the side of a flat bed without using bedrails?: A Little Help needed moving to and from a bed to a chair (including a wheelchair)?: A Little Help needed standing up from a chair using your arms (e.g., wheelchair or bedside chair)?: A Little Help needed to walk in hospital room?: A Little Help needed climbing 3-5 steps with a railing? : A Lot 6 Click Score: 17    End of Session   Activity Tolerance: Patient limited by fatigue;Patient limited by pain Patient left: in bed;with call bell/phone within reach;with bed alarm set Nurse Communication: Mobility status PT Visit Diagnosis: Other abnormalities of gait and mobility (R26.89);Muscle weakness (generalized) (M62.81);Unsteadiness on feet (R26.81);Pain    Time: 8657-8469 PT Time Calculation (min) (ACUTE ONLY): 30 min   Charges:   PT Evaluation $PT Eval Low Complexity: 1 Low PT Treatments $Therapeutic Activity: 8-22 mins PT General Charges $$ ACUTE PT VISIT: 1 Visit  Blima Rich PT, DPT Marlana Salvage Zaunegger 06/06/2023, 12:41 PM

## 2023-06-07 DIAGNOSIS — J189 Pneumonia, unspecified organism: Secondary | ICD-10-CM | POA: Diagnosis not present

## 2023-06-07 LAB — CBC WITH DIFFERENTIAL/PLATELET
Abs Immature Granulocytes: 0.22 10*3/uL — ABNORMAL HIGH (ref 0.00–0.07)
Basophils Absolute: 0 10*3/uL (ref 0.0–0.1)
Basophils Relative: 0 %
Eosinophils Absolute: 0.3 10*3/uL (ref 0.0–0.5)
Eosinophils Relative: 2 %
HCT: 26.4 % — ABNORMAL LOW (ref 39.0–52.0)
Hemoglobin: 8.7 g/dL — ABNORMAL LOW (ref 13.0–17.0)
Immature Granulocytes: 2 %
Lymphocytes Relative: 9 %
Lymphs Abs: 1.3 10*3/uL (ref 0.7–4.0)
MCH: 30.2 pg (ref 26.0–34.0)
MCHC: 33 g/dL (ref 30.0–36.0)
MCV: 91.7 fL (ref 80.0–100.0)
Monocytes Absolute: 0.8 10*3/uL (ref 0.1–1.0)
Monocytes Relative: 6 %
Neutro Abs: 11.6 10*3/uL — ABNORMAL HIGH (ref 1.7–7.7)
Neutrophils Relative %: 81 %
Platelets: 518 10*3/uL — ABNORMAL HIGH (ref 150–400)
RBC: 2.88 MIL/uL — ABNORMAL LOW (ref 4.22–5.81)
RDW: 14.3 % (ref 11.5–15.5)
WBC: 14.2 10*3/uL — ABNORMAL HIGH (ref 4.0–10.5)
nRBC: 0 % (ref 0.0–0.2)

## 2023-06-07 LAB — BASIC METABOLIC PANEL
Anion gap: 10 (ref 5–15)
BUN: 17 mg/dL (ref 6–20)
CO2: 19 mmol/L — ABNORMAL LOW (ref 22–32)
Calcium: 8.2 mg/dL — ABNORMAL LOW (ref 8.9–10.3)
Chloride: 103 mmol/L (ref 98–111)
Creatinine, Ser: 1.23 mg/dL (ref 0.61–1.24)
GFR, Estimated: >60 mL/min (ref >60)
Glucose, Bld: 101 mg/dL — ABNORMAL HIGH (ref 70–99)
Potassium: 4.2 mmol/L (ref 3.5–5.1)
Sodium: 132 mmol/L — ABNORMAL LOW (ref 135–145)

## 2023-06-07 MED ORDER — DOXYCYCLINE HYCLATE 100 MG PO TABS
100.0000 mg | ORAL_TABLET | Freq: Two times a day (BID) | ORAL | Status: AC
  Administered 2023-06-07 – 2023-06-08 (×4): 100 mg via ORAL
  Filled 2023-06-07 (×4): qty 1

## 2023-06-07 MED ORDER — SODIUM CHLORIDE 0.9 % IV SOLN
2.0000 g | Freq: Three times a day (TID) | INTRAVENOUS | Status: DC
  Administered 2023-06-07: 2 g via INTRAVENOUS
  Filled 2023-06-07: qty 12.5

## 2023-06-07 MED ORDER — ONDANSETRON HCL 4 MG/2ML IJ SOLN
4.0000 mg | Freq: Four times a day (QID) | INTRAMUSCULAR | Status: DC | PRN
  Administered 2023-06-07: 4 mg via INTRAVENOUS
  Filled 2023-06-07: qty 2

## 2023-06-07 MED ORDER — AMOXICILLIN-POT CLAVULANATE 875-125 MG PO TABS
1.0000 | ORAL_TABLET | Freq: Two times a day (BID) | ORAL | Status: AC
  Administered 2023-06-07 – 2023-06-08 (×4): 1 via ORAL
  Filled 2023-06-07 (×4): qty 1

## 2023-06-07 NOTE — Plan of Care (Signed)

## 2023-06-07 NOTE — H&P (Signed)
PROGRESS NOTE  Craig Schmidt  DOB: 1971/03/02  PCP: Rema Fendt, NP NUU:[id number]  DOA: 06/04/2023  LOS: 2 days  Hospital Day: 4  Brief narrative: Craig Schmidt is a 52 y.o. male with PMH significant for homelessness for 30 years, polysubstance abuse (cocaine, alcohol), medical noncompliance, HTN, HLD, stroke, CAD/STEMI, cardiomyopathy, chronic systolic CHF, CKD who was recently hospitalized 11/2 for pneumonia and left AMA on 11/8.  During that hospitalization, there was also suspicion of stroke but neurology reviewed imaging and deemed it as an artifact.  Social worker while in process of finding a SNF for him but patient decided to leave AMA. At the time of discharge, patient was given prescription for oral doxycycline as a courtesy.   Last night, patient was brought to ED by EMS with complaint of chest pain that started around 1 PM in the afternoon.  Patient used to be transported to hospital at the time.  He continued to chest pain and also associated with shortness of breath, chest tightness and called EMS again in the night to be brought to the hospital. With EMS, his vital signs were stable, CBG 181 En route to the ED, he was given aspirin 324 milligram and nitroglycerin.  In the ED, he was afebrile, tachycardic to low 100s, blood pressure in 130s, tachypneic to 30s, O2 sat down to 80% at one point requiring 15 L oxygen by nasal cannula. Initial labs with WBC count 18.8, hemoglobin 10.9, BNP more than 1100, troponin mildly elevated to 19, Renal function normal, lactic acid level normal CT angio chest did not show any evidence of pulm embolism.  It showed progressive multilobar bilateral pneumonia, most severe in the lower lobes of the lungs bilaterally. Patient was started on IV cefepime, IV vancomycin Ad[facility] to [facility]  Subjective: Patient was seen and examined this morning.   Lying down on bed.  Not in distress. Breathing on room air.  Still complains of intermittent  rib wall pain  Assessment/Plan: Sepsis POA Multifocal pneumonia Recently hospitalized with pneumonia and left AMA without completion of treatment. Presented with progressive shortness of breath No fever, tachycardic, tachypneic, had leukocytosis and elevated procalcitonin level Chest imaging showed progressive multilobar bilateral pneumonia most severe in the lower lobes bilaterally Blood culture did not any growth Initially treated with IV cefepime and IV vancomycin.  Switched to amoxicillin and doxycycline today to complete on 11/17. Continue to monitor. Recent Labs  Lab 06/04/23 0133 06/04/23 0335 06/04/23 1059 06/05/23 0456 06/06/23 0923 06/07/23 0515  WBC 18.8*  --   --  16.9* 15.6* 14.2*  LATICACIDVEN  --  1.5 1.0  --   --   --   PROCALCITON  --  0.14  --   --   --   --    Acute on chronic systolic CHF Cardiomyopathy Presented with shortness of breath, bilateral lower extremity edema BNP elevated more than 1100 Echocardiogram 11/4 showed EF less than 20%, severely decreased function, global hypokinesis, grade 2 diastolic dysfunction, moderate to severely dilated LA.  EF on echo from May 2024 that showed 25 to 30%. PTA meds-he has been noncompliant to GDMT with Coreg, Jardiance, metolazone, Imdur, Lasix Currently on Coreg 3.125 mg twice daily.  Heart rate 90s, blood pressure in low 100s.  Continue to monitor.  Acute respiratory failure with hypoxia Chest wall pain In the setting of multifocal pneumonia and shallow breathing due to pleuritic chest wall pain likely due to pneumonia.  No evidence of rib fractures in imagings.  Was not on supplemental oxygen at baseline. Required up to 15 liters in the ED. Gradually weaned down.  Currently on room air. For pain control, continue lidocaine patch. Avoid IV opioids minimize the frequency of PRN oxycodone.  H/o CAD/STEMI Noncompliant to Plavix and statin.  Currently resumed on both  H/o stroke Patient has mild chronic right  upper extremity weakness from prior strokes. Recent hospitalization, imagings raised suspicion of stroke but neurology reviewed the imagings and deemed them to be artifact. In the past, patient was on anticoagulation with Coumadin but he was not compliant to it. Continue Plavix and statin   CKD 2 Creatinine at baseline Recent Labs    01/23/23 1522 03/07/23 1202 05/25/23 1339 05/25/23 2032 05/26/23 0455 05/29/23 1323 05/31/23 0749 06/04/23 0133 06/05/23 0456 06/06/23 0923 06/07/23 0515  BUN 22* 20 22*  --  18 19 13 13 15 17 17   CREATININE 1.70* 1.80* 1.48* 1.53* 1.32* 1.48* 1.32* 1.22 1.30* 1.52* 1.23   Homelessness Social work to follow-up  polysubstance abuse (cocaine, alcohol) Counseled to quit. Last alcohol drink was prior to presentation. Denies any withdrawal symptoms in the hospital  Impaired mobility PT eval obtained.   SNF recommended  Goals of care   Code Status: Full Code    DVT prophylaxis:  enoxaparin (LOVENOX) injection 40 mg Start: 06/04/23 1000   Antimicrobials: Oral antibiotics Fluid: None Consultants: None Family Communication: None at bedside  Status: Observation Level of care:  Telemetry Medical   Patient is from: Homeless Needs to continue in-hospital care: Pending SNF.  Medically stable for discharge    Diet:  Diet Order             Diet 2 gram sodium Room service appropriate? Yes; Fluid consistency: Thin  Diet effective now                   Scheduled Meds:  amoxicillin-clavulanate  1 tablet Oral Q12H   atorvastatin  40 mg Oral Daily   carvedilol  3.125 mg Oral BID   clopidogrel  75 mg Oral Daily   doxycycline  100 mg Oral Q12H   enoxaparin (LOVENOX) injection  40 mg Subcutaneous Daily   folic acid  1 mg Oral Daily   lidocaine  1 patch Transdermal Q24H   multivitamin with minerals  1 tablet Oral Daily   thiamine  100 mg Oral Daily   Or   thiamine  100 mg Intravenous Daily    PRN meds: acetaminophen **OR**  acetaminophen, albuterol, hydrALAZINE, oxyCODONE   Infusions:     Antimicrobials: Anti-infectives (From admission, onward)    Start     Dose/Rate Route Frequency Ordered Stop   06/07/23 1230  doxycycline (VIBRA-TABS) tablet 100 mg        100 mg Oral Every 12 hours 06/07/23 1130 06/09/23 0959   06/07/23 1230  amoxicillin-clavulanate (AUGMENTIN) 875-125 MG per tablet 1 tablet        1 tablet Oral Every 12 hours 06/07/23 1130 06/09/23 0959   06/07/23 0800  ceFEPIme (MAXIPIME) 2 g in sodium chloride 0.9 % 100 mL IVPB  Status:  Discontinued        2 g 200 mL/hr over 30 Minutes Intravenous Every 8 hours 06/07/23 0758 06/07/23 1130   06/06/23 2130  ceFEPIme (MAXIPIME) 2 g in sodium chloride 0.9 % 100 mL IVPB  Status:  Discontinued        2 g 200 mL/hr over 30 Minutes Intravenous Every 12 hours 06/06/23 1233 06/07/23 0758  06/04/23 1100  vancomycin (VANCOREADY) IVPB 750 mg/150 mL  Status:  Discontinued        750 mg 150 mL/hr over 60 Minutes Intravenous 2 times daily 06/04/23 0919 06/07/23 1130   06/04/23 0930  ceFEPIme (MAXIPIME) 2 g in sodium chloride 0.9 % 100 mL IVPB  Status:  Discontinued        2 g 200 mL/hr over 30 Minutes Intravenous Every 8 hours 06/04/23 0915 06/06/23 1233   06/04/23 0115  vancomycin (VANCOREADY) IVPB 1500 mg/300 mL        1,500 mg 150 mL/hr over 120 Minutes Intravenous  Once 06/04/23 0112 06/04/23 0444   06/04/23 0115  ceFEPIme (MAXIPIME) 2 g in sodium chloride 0.9 % 100 mL IVPB        2 g 200 mL/hr over 30 Minutes Intravenous  Once 06/04/23 0112 06/04/23 0210       Objective: Vitals:   06/06/23 1956 06/07/23 0124  BP: 113/72 (!) 92/59  Pulse: 100 94  Resp: 16 18  Temp: 98.4 F (36.9 C) 98 F (36.7 C)  SpO2: 97% 99%    Intake/Output Summary (Last 24 hours) at 06/07/2023 1405 Last data filed at 06/07/2023 1035 Gross per 24 hour  Intake 118 ml  Output --  Net 118 ml   Filed Weights   06/04/23 0943  Weight: 71.3 kg   Weight change:  Body  mass index is 24.62 kg/m.   Physical Exam: General exam: Middle-aged African-American male.  Not in physical pain Skin: No rashes, lesions or ulcers. HEENT: Atraumatic, normocephalic, no obvious bleeding Lungs: Breathing better.  No crackles.  No wheezing. CVS: Regular rate and rhythm, no murmur GI/Abd soft, nontender, nondistended, bowel sound present CNS: Alert, awake, able to orientation questions Psychiatry: Sad affect Extremities: 1+ bilateral pedal edema improving  Data Review: I have personally reviewed the laboratory data and studies available.  F/u labs ordered Unresulted Labs (From admission, onward)     Start     Ordered   06/06/23 0500  CBC with Differential/Platelet  Daily,   R     Question:  Specimen collection method  Answer:  Lab=Lab collect   06/05/23 1618   06/06/23 0500  Basic metabolic panel  Daily,   R     Question:  Specimen collection method  Answer:  Lab=Lab collect   06/05/23 1618            Total time spent in review of labs and imaging, patient evaluation, formulation of plan, documentation and communication with family: 25 minutes  Signed, Lorin Glass, MD [facility] 06/07/2023

## 2023-06-07 NOTE — Progress Notes (Signed)
Physical Therapy Treatment Patient Details Name: Craig Schmidt MRN: [id number] DOB: 21-Jul-1971 Today's Date: 06/07/2023   History of Present Illness Pt is a 51 y/o M who was recently hospitalized 11/2 for pneumonia and left AMA on 11/8, now presenting with progressive multilobar bilateral pneumonia.PMH includes homelessness for 30 years, polysubstance abuse (cocaine, alcohol), medical noncompliance, HTN, HLD, stroke, CAD/STEMI, cardiomyopathy, chronic systolic CHF, CKD    PT Comments  Pt received in supine and agreeable to session. Pt able to complete bed mobility with up to min A, however reports significant pain mostly in back. Pt demonstrates very flexed trunk posture in sitting and reports inability to improve upright posture due to back pain despite education on importance of improved posture. Attempted BLE exercise sitting EOB, however pt unable to tolerate after one rep. Pt educated on importance of rolling for pressure relief in bed due to reported bottom soreness, however pt stating "I can't do that everyday". Pt declines further exercise and standing attempts due to increased fatigue and pain. Pt continues to benefit from PT services to progress toward functional mobility goals.    If plan is discharge home, recommend the following: Assistance with cooking/housework;Assist for transportation;Help with stairs or ramp for entrance;A little help with walking and/or transfers;A little help with bathing/dressing/bathroom   Can travel by private vehicle     Yes  Equipment Recommendations  Wheelchair (measurements PT);Wheelchair cushion (measurements PT)    Recommendations for Other Services       Precautions / Restrictions Precautions Precautions: Fall Precaution Comments: R hemiparesis Restrictions Weight Bearing Restrictions: No     Mobility  Bed Mobility Overal bed mobility: Needs Assistance Bed Mobility: Supine to Sit, Sit to Sidelying     Supine to sit: Contact guard,  HOB elevated   Sit to sidelying: Min assist General bed mobility comments: Min A to elevate BLE back to EOB    Transfers                   General transfer comment: Pt declined due to pain         Balance Overall balance assessment: Needs assistance Sitting-balance support: Feet supported, No upper extremity supported Sitting balance-Leahy Scale: Good Sitting balance - Comments: Pt with significant trunk flexion despite cues due to back pain, but no LOB                                    Cognition Arousal: Alert Behavior During Therapy: Lability Overall Cognitive Status: Difficult to assess Area of Impairment: Safety/judgement, Problem solving                               General Comments: Pt limited by pain and does not appear to understand purpose of interventions despite education, stating "i can't do it" and "you're forcing me"        Exercises      General Comments        Pertinent Vitals/Pain Pain Assessment Pain Assessment: Faces Faces Pain Scale: Hurts whole lot Pain Location: "all over", back Pain Descriptors / Indicators: Aching, Discomfort, Grimacing, Guarding, Moaning Pain Intervention(s): Monitored during session, Limited activity within patient's tolerance, Repositioned     PT Goals (current goals can now be found in the care plan section) Acute Rehab PT Goals Patient Stated Goal: to get better PT Goal Formulation: With patient Time For  Goal Achievement: 06/12/23 Progress towards PT goals: Not progressing toward goals - comment (limited by pain)    Frequency    Min 1X/week       AM-PAC PT "6 Clicks" Mobility   Outcome Measure  Help needed turning from your back to your side while in a flat bed without using bedrails?: A Little Help needed moving from lying on your back to sitting on the side of a flat bed without using bedrails?: A Little Help needed moving to and from a bed to a chair (including a  wheelchair)?: A Lot Help needed standing up from a chair using your arms (e.g., wheelchair or bedside chair)?: A Lot Help needed to walk in hospital room?: A Lot Help needed climbing 3-5 steps with a railing? : Total 6 Click Score: 13    End of Session   Activity Tolerance: Patient limited by pain;Patient limited by fatigue Patient left: in bed;with call bell/phone within reach;with bed alarm set Nurse Communication: Mobility status PT Visit Diagnosis: Other abnormalities of gait and mobility (R26.89);Muscle weakness (generalized) (M62.81);Unsteadiness on feet (R26.81);Pain     Time: 6045-4098 PT Time Calculation (min) (ACUTE ONLY): 23 min  Charges:    $Therapeutic Activity: 23-37 mins PT General Charges $$ ACUTE PT VISIT: 1 Visit                    Johny Shock, PTA Acute Rehabilitation Services Secure Chat Preferred  [phone]    Johny Shock 06/07/2023, 1:05 PM

## 2023-06-07 NOTE — Plan of Care (Signed)
A/Ox4 and on room air. Q6 oxy prn for chronic back pain. Uses urinal. Patient voiced that he would like nephrology to evaluate him if possible. Remains on IV antibiotics.    Problem: Education: Goal: Knowledge of General Education information will improve Description: Including pain rating scale, medication(s)/side effects and non-pharmacologic comfort measures Outcome: Not Progressing   Problem: Health Behavior/Discharge Planning: Goal: Ability to manage health-related needs will improve Outcome: Not Progressing   Problem: Clinical Measurements: Goal: Ability to maintain clinical measurements within normal limits will improve Outcome: Not Progressing Goal: Will remain free from infection Outcome: Not Progressing Goal: Diagnostic test results will improve Outcome: Not Progressing Goal: Respiratory complications will improve Outcome: Not Progressing Goal: Cardiovascular complication will be avoided Outcome: Not Progressing   Problem: Activity: Goal: Risk for activity intolerance will decrease Outcome: Not Progressing   Problem: Nutrition: Goal: Adequate nutrition will be maintained Outcome: Not Progressing   Problem: Coping: Goal: Level of anxiety will decrease Outcome: Not Progressing   Problem: Elimination: Goal: Will not experience complications related to bowel motility Outcome: Not Progressing Goal: Will not experience complications related to urinary retention Outcome: Not Progressing   Problem: Pain Management: Goal: General experience of comfort will improve Outcome: Not Progressing   Problem: Safety: Goal: Ability to remain free from injury will improve Outcome: Not Progressing   Problem: Skin Integrity: Goal: Risk for impaired skin integrity will decrease Outcome: Not Progressing

## 2023-06-07 NOTE — Progress Notes (Signed)
Mobility Specialist Progress Note:   06/07/23 1554  Mobility  Activity Ambulated with assistance in hallway  Level of Assistance Contact guard assist, steadying assist  Assistive Device Front wheel walker  Distance Ambulated (ft) 60 ft  Activity Response Tolerated well  Mobility Referral Yes  $Mobility charge 1 Mobility  Mobility Specialist Start Time (ACUTE ONLY) 1520  Mobility Specialist Stop Time (ACUTE ONLY) 1545  Mobility Specialist Time Calculation (min) (ACUTE ONLY) 25 min   Pt received in bed, appearing weak and frail. C/o fatigue and declined mobility at first. Required max encouragement by myself and s/o in room to participate in mobility session. Informed pt on the importance of mobility and the risk of deconditioning. Able to sit EOB via slow and labored movements, wincing in pain throughout. No assistance required to dangle EOB or stand. Pt received multiple cues throughout session for safety, pt ignored and responded "I got to do what work's for me" and "I have knowledge." Pt has personal cane in room, but insisted on using RW. Pt held onto L side of RW, despite having R hemiparesis.  Assisted pt by holding R side of RW in order to prevent LOB and provided CGA with gait belt. Tolerated well, c/o fatigue but denies any pain. Returned pt back to room via WC w/o fault. Pt able to stand and pivot independently from WC>B and re adjust comfortably. Left pt in bed, all needs met, guest at bedside.   Feliciana Rossetti Mobility Specialist Please contact via Special educational needs teacher or  Rehab office at [phone]

## 2023-06-07 NOTE — Progress Notes (Signed)
Pt resting with no distress. BiPAP not needed at this time.

## 2023-06-08 DIAGNOSIS — J189 Pneumonia, unspecified organism: Secondary | ICD-10-CM | POA: Diagnosis not present

## 2023-06-08 LAB — BASIC METABOLIC PANEL
Anion gap: 8 (ref 5–15)
BUN: 15 mg/dL (ref 6–20)
CO2: 22 mmol/L (ref 22–32)
Calcium: 8.4 mg/dL — ABNORMAL LOW (ref 8.9–10.3)
Chloride: 104 mmol/L (ref 98–111)
Creatinine, Ser: 1.23 mg/dL (ref 0.61–1.24)
GFR, Estimated: >60 mL/min (ref >60)
Glucose, Bld: 101 mg/dL — ABNORMAL HIGH (ref 70–99)
Potassium: 4.4 mmol/L (ref 3.5–5.1)
Sodium: 134 mmol/L — ABNORMAL LOW (ref 135–145)

## 2023-06-08 LAB — CBC WITH DIFFERENTIAL/PLATELET
Abs Immature Granulocytes: 0.08 10*3/uL — ABNORMAL HIGH (ref 0.00–0.07)
Basophils Absolute: 0 10*3/uL (ref 0.0–0.1)
Basophils Relative: 0 %
Eosinophils Absolute: 0.3 10*3/uL (ref 0.0–0.5)
Eosinophils Relative: 3 %
HCT: 27.1 % — ABNORMAL LOW (ref 39.0–52.0)
Hemoglobin: 8.6 g/dL — ABNORMAL LOW (ref 13.0–17.0)
Immature Granulocytes: 1 %
Lymphocytes Relative: 10 %
Lymphs Abs: 1.1 10*3/uL (ref 0.7–4.0)
MCH: 28.9 pg (ref 26.0–34.0)
MCHC: 31.7 g/dL (ref 30.0–36.0)
MCV: 90.9 fL (ref 80.0–100.0)
Monocytes Absolute: 0.8 10*3/uL (ref 0.1–1.0)
Monocytes Relative: 7 %
Neutro Abs: 8.4 10*3/uL — ABNORMAL HIGH (ref 1.7–7.7)
Neutrophils Relative %: 79 %
Platelets: 600 10*3/uL — ABNORMAL HIGH (ref 150–400)
RBC: 2.98 MIL/uL — ABNORMAL LOW (ref 4.22–5.81)
RDW: 14.3 % (ref 11.5–15.5)
WBC: 10.6 10*3/uL — ABNORMAL HIGH (ref 4.0–10.5)
nRBC: 0 % (ref 0.0–0.2)

## 2023-06-08 NOTE — Progress Notes (Signed)
   06/07/23 2236  BiPAP/CPAP/SIPAP  Reason BIPAP/CPAP not in use Non-compliant (pt refused)     06/07/23 2236  BiPAP/CPAP/SIPAP  Reason BIPAP/CPAP not in use Non-compliant (pt refused)

## 2023-06-08 NOTE — Progress Notes (Signed)
Mobility Specialist Progress Note:   06/08/23 1200  Mobility  Activity Ambulated with assistance in hallway  Level of Assistance Contact guard assist, steadying assist  Assistive Device Front wheel walker  Distance Ambulated (ft) 125 ft  Activity Response Tolerated poorly  Mobility Referral Yes  $Mobility charge 1 Mobility  Mobility Specialist Start Time (ACUTE ONLY) 1200  Mobility Specialist Stop Time (ACUTE ONLY) 1215  Mobility Specialist Time Calculation (min) (ACUTE ONLY) 15 min    Pt received in bed, encouragement required to participate in mobility session, s/o at bedside. Pt c/o fatigue and back pain, but was agreeable to session. Pt slow and labored at first, attempting to sit EOB, but grew irritable and impulsive, attempting to stand at bedside and start session w/o socks, gait belt, or AD, stating he didn't need them. Emphasized importance of these safety measures, encouraged to sit EOB while socks and gait belt are donned, appeared agreeable.  Ambulated in hallway with RW, assisted in guiding R side of RW d/t R hemiparesis but as distance increased so did pt's irritability and recklessness. Pt would increase speed, ignore cues, resist assistance or needed rest breaks. Encouraged pt to return to room multiple times throughout session to prevent LOB. Pt began refusing CGA halfway through session despite severe unsteadiness. Every few feet pt would dry heave and began drooling. Again, in attempts to redirect pt back to room, pt would just ambulate faster stating , "No I'm going to keep going, I'm gonna show you since nobody thinks I have back pain." RN able to assist with encouraging pt to return back to room for pain meds. Pt returned to room, RN at bedside, all needs met.   Feliciana Rossetti Mobility Specialist Please contact via Special educational needs teacher or  Rehab office at [phone]

## 2023-06-08 NOTE — Plan of Care (Signed)

## 2023-06-08 NOTE — [place] FL2 (Signed)
[place] MEDICAID FL2 LEVEL OF CARE FORM     IDENTIFICATION  Patient Name: Craig Schmidt Birthdate: Aug 03, 1970 Sex: male Admission Date (Current Location): 06/04/2023  [facility] and [place] Number:  Producer, television/film/video and Address:  [facility] [place], [address] [place], [place] [address]      Provider Number: [id number]  Attending Physician Name and Address:  Lorin Glass, MD  Relative Name and Phone Number:       Current Level of Care: Hospital Recommended Level of Care: Skilled Nursing Facility Prior Approval Number:    Date Approved/Denied:   PASRR Number: [id number]  Discharge Plan: SNF    Current Diagnoses: Patient Active Problem List   Diagnosis Date Noted   Troponin I above reference range 05/25/2023   CAP (community acquired pneumonia) 05/25/2023   Pneumonia 05/25/2023   Acute on chronic combined systolic and diastolic heart failure (HCC) 12/03/2022   Malnutrition of moderate degree 11/30/2022   Acute on chronic combined systolic and diastolic CHF (congestive heart failure) (HCC) 11/29/2022   Acute on chronic combined systolic (congestive) and diastolic (congestive) heart failure (HCC) 11/28/2022   ACS (acute coronary syndrome) (HCC) 11/28/2022   Back pain 11/28/2022   Essential hypertension 11/28/2022   Dyslipidemia 11/28/2022   Noncompliance with medication regimen 11/28/2022   ST elevation myocardial infarction (STEMI) of inferolateral wall, subsequent episode of care (HCC) 12/19/2021   ST elevation myocardial infarction (STEMI) (HCC)    Ischemic cardiomyopathy    Cocaine abuse (HCC)    Smoker    Alcohol abuse    CVA (cerebral vascular accident) (HCC) 09/08/2021   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   Tear of MCL (medial collateral ligament) of knee 05/20/2015   Protein-calorie malnutrition, severe 05/17/2015   Lactic acidosis 05/15/2015   Left knee pain 05/15/2015   Lung nodules 05/15/2015    Hypoglycemia 05/14/2015   Hypothermia 05/14/2015   Acute encephalopathy 05/14/2015   Cocaine abuse with intoxication (HCC) 05/14/2015   Alcohol intoxication in active alcoholic (HCC) 05/14/2015   Sepsis (HCC) 05/14/2015   Homeless 05/14/2015    Orientation RESPIRATION BLADDER Height & Weight     Self, Time, Situation  Normal Continent Weight: 157 lb 3 oz (71.3 kg) Height:  5\' 7"  (170.2 cm)  BEHAVIORAL SYMPTOMS/MOOD NEUROLOGICAL BOWEL NUTRITION STATUS      Continent    AMBULATORY STATUS COMMUNICATION OF NEEDS Skin   Limited Assist Verbally Normal                       Personal Care Assistance Level of Assistance  Bathing, Feeding, Dressing Bathing Assistance: Maximum assistance Feeding assistance: Limited assistance Dressing Assistance: Maximum assistance     Functional Limitations Info  Sight, Hearing, Speech Sight Info: Adequate Hearing Info: Adequate Speech Info: Adequate    SPECIAL CARE FACTORS FREQUENCY  PT (By licensed PT), OT (By licensed OT)                    Contractures Contractures Info: Not present    Additional Factors Info  Code Status Code Status Info: FULL CODE             Current Medications (06/08/2023):  This is the current hospital active medication list Current Facility-Administered Medications  Medication Dose Route Frequency Provider Last Rate Last Admin   acetaminophen (TYLENOL) tablet 650 mg  650 mg Oral Q6H PRN Lorin Glass, MD   650 mg at 06/05/23 2136   Or  acetaminophen (TYLENOL) suppository 650 mg  650 mg Rectal Q6H PRN Dahal, Binaya, MD       albuterol (PROVENTIL) (2.5 MG/3ML) 0.083% nebulizer solution 2.5 mg  2.5 mg Nebulization Q6H PRN Dahal, Binaya, MD       amoxicillin-clavulanate (AUGMENTIN) 875-125 MG per tablet 1 tablet  1 tablet Oral Q12H Dahal, Binaya, MD   1 tablet at 06/08/23 0943   atorvastatin (LIPITOR) tablet 40 mg  40 mg Oral Daily Dahal, Melina Schools, MD   40 mg at 06/08/23 0943   carvedilol (COREG) tablet  3.125 mg  3.125 mg Oral BID Lorin Glass, MD   3.125 mg at 06/08/23 0943   clopidogrel (PLAVIX) tablet 75 mg  75 mg Oral Daily Dahal, Melina Schools, MD   75 mg at 06/08/23 0943   doxycycline (VIBRA-TABS) tablet 100 mg  100 mg Oral Q12H Dahal, Melina Schools, MD   100 mg at 06/08/23 0942   enoxaparin (LOVENOX) injection 40 mg  40 mg Subcutaneous Daily Dahal, Binaya, MD   40 mg at 06/07/23 1702   folic acid (FOLVITE) tablet 1 mg  1 mg Oral Daily Dahal, Binaya, MD   1 mg at 06/08/23 0943   hydrALAZINE (APRESOLINE) injection 10 mg  10 mg Intravenous Q6H PRN Dahal, Binaya, MD       lidocaine (LIDODERM) 5 % 1 patch  1 patch Transdermal Q24H Dahal, Binaya, MD   1 patch at 06/07/23 0932   multivitamin with minerals tablet 1 tablet  1 tablet Oral Daily Dahal, Binaya, MD   1 tablet at 06/08/23 0943   ondansetron (ZOFRAN) injection 4 mg  4 mg Intravenous Q6H PRN Dahal, Melina Schools, MD   4 mg at 06/07/23 1500   oxyCODONE (Oxy IR/ROXICODONE) immediate release tablet 5 mg  5 mg Oral Q6H PRN Dahal, Melina Schools, MD   5 mg at 06/07/23 2130   thiamine (VITAMIN B1) tablet 100 mg  100 mg Oral Daily Dahal, Melina Schools, MD   100 mg at 06/08/23 1610   Or   thiamine (VITAMIN B1) injection 100 mg  100 mg Intravenous Daily Dahal, Melina Schools, MD         Discharge Medications: Please see discharge summary for a list of discharge medications.  Relevant Imaging Results:  Relevant Lab Results:   Additional Information SSN:[id number]  Deatra [place], [place]

## 2023-06-08 NOTE — Progress Notes (Signed)
PROGRESS NOTE  Craig Schmidt  DOB: [birth year]  PCP: Rema Fendt, NP EXB:[id number]  DOA: 06/04/2023  LOS: 3 days  Hospital Day: 5  Brief narrative: Craig Schmidt is a 52 y.o. male with PMH significant for homelessness for 30 years, polysubstance abuse (cocaine, alcohol), medical noncompliance, HTN, HLD, stroke, CAD/STEMI, cardiomyopathy, chronic systolic CHF, CKD who was recently hospitalized 11/2 for pneumonia and left AMA on 11/8.  During that hospitalization, there was also suspicion of stroke but neurology reviewed imaging and deemed it as an artifact.  Social worker while in process of finding a SNF for him but patient decided to leave AMA. At the time of discharge, patient was given prescription for oral doxycycline as a courtesy.   Last night, patient was brought to ED by EMS with complaint of chest pain that started around 1 PM in the afternoon.  Patient used to be transported to hospital at the time.  He continued to chest pain and also associated with shortness of breath, chest tightness and called EMS again in the night to be brought to the hospital. With EMS, his vital signs were stable, CBG 181 En route to the ED, he was given aspirin 324 milligram and nitroglycerin.  In the ED, he was afebrile, tachycardic to low 100s, blood pressure in 130s, tachypneic to 30s, O2 sat down to 80% at one point requiring 15 L oxygen by nasal cannula. Initial labs with WBC count 18.8, hemoglobin 10.9, BNP more than 1100, troponin mildly elevated to 19, Renal function normal, lactic acid level normal CT angio chest did not show any evidence of pulm embolism.  It showed progressive multilobar bilateral pneumonia, most severe in the lower lobes of the lungs bilaterally. Patient was started on IV cefepime, IV vancomycin Admitted to [facility]  Subjective: Patient was seen and examined this morning.   Lying on bed.  Not in distress no new symptoms. Needs a shower.  Patient has been refusing  it Pending SNF  Assessment/Plan: Sepsis POA Multifocal pneumonia Recently hospitalized with pneumonia and left AMA without completion of treatment. Presented with progressive shortness of breath No fever, tachycardic, tachypneic, had leukocytosis and elevated procalcitonin level Chest imaging showed progressive multilobar bilateral pneumonia most severe in the lower lobes bilaterally Blood culture did not any growth Initially treated with IV cefepime and IV vancomycin.  Switched to amoxicillin and doxycycline today to complete on 11/17. Continue to monitor. Recent Labs  Lab 06/04/23 0133 06/04/23 0335 06/04/23 1059 06/05/23 0456 06/06/23 0923 06/07/23 0515 06/08/23 0505  WBC 18.8*  --   --  16.9* 15.6* 14.2* 10.6*  LATICACIDVEN  --  1.5 1.0  --   --   --   --   PROCALCITON  --  0.14  --   --   --   --   --    Acute on chronic systolic CHF Cardiomyopathy Presented with shortness of breath, bilateral lower extremity edema BNP elevated more than 1100 Echocardiogram 11/4 showed EF less than 20%, severely decreased function, global hypokinesis, grade 2 diastolic dysfunction, moderate to severely dilated LA.  EF on echo from May 2024 that showed 25 to 30%. PTA meds-he has been noncompliant to GDMT with Coreg, Jardiance, metolazone, Imdur, Lasix Currently on Coreg 3.125 mg twice daily.  Heart rate 90s, blood pressure in low 100s.  Continue to monitor.  Acute respiratory failure with hypoxia Chest wall pain In the setting of multifocal pneumonia and shallow breathing due to pleuritic chest wall pain likely due  to pneumonia.  No evidence of rib fractures in imagings. Was not on supplemental oxygen at baseline. Required up to 15 liters in the ED. Gradually weaned down. Currently on room air. For pain control, continue lidocaine patch. Avoid IV opioids minimize the frequency of PRN oxycodone.  H/o CAD/STEMI Noncompliant to Plavix and statin.  Currently resumed on both.  H/o  stroke Patient has mild chronic right upper extremity weakness from prior strokes. Recent hospitalization, imagings raised suspicion of stroke but neurology reviewed the imagings and deemed them to be artifact. In the past, patient was on anticoagulation with Coumadin but he was not compliant to it. Continue Plavix and statin   CKD 2 Creatinine at baseline Recent Labs    03/07/23 1202 05/25/23 1339 05/25/23 2032 05/26/23 0455 05/29/23 1323 05/31/23 0749 06/04/23 0133 06/05/23 0456 06/06/23 0923 06/07/23 0515 06/08/23 0505  BUN 20 22*  --  18 19 13 13 15 17 17 15   CREATININE 1.80* 1.48* 1.53* 1.32* 1.48* 1.32* 1.22 1.30* 1.52* 1.23 1.23   Homelessness Social work to follow-up  polysubstance abuse (cocaine, alcohol) Counseled to quit. Last alcohol drink was prior to presentation. Denies any withdrawal symptoms in the hospital  Impaired mobility PT eval obtained.   SNF recommended  Goals of care   Code Status: Full Code    DVT prophylaxis:  enoxaparin (LOVENOX) injection 40 mg Start: 06/04/23 1000   Antimicrobials: Oral antibiotics Fluid: None Consultants: None Family Communication: None at bedside  Status: Observation Level of care:  Telemetry Medical   Patient is from: Homeless Needs to continue in-hospital care: Pending SNF.  Medically stable for discharge    Diet:  Diet Order             Diet 2 gram sodium Room service appropriate? Yes; Fluid consistency: Thin  Diet effective now                   Scheduled Meds:  amoxicillin-clavulanate  1 tablet Oral Q12H   atorvastatin  40 mg Oral Daily   carvedilol  3.125 mg Oral BID   clopidogrel  75 mg Oral Daily   doxycycline  100 mg Oral Q12H   enoxaparin (LOVENOX) injection  40 mg Subcutaneous Daily   folic acid  1 mg Oral Daily   lidocaine  1 patch Transdermal Q24H   multivitamin with minerals  1 tablet Oral Daily   thiamine  100 mg Oral Daily   Or   thiamine  100 mg Intravenous Daily     PRN meds: acetaminophen **OR** acetaminophen, albuterol, hydrALAZINE, ondansetron (ZOFRAN) IV, oxyCODONE   Infusions:     Antimicrobials: Anti-infectives (From admission, onward)    Start     Dose/Rate Route Frequency Ordered Stop   06/07/23 1230  doxycycline (VIBRA-TABS) tablet 100 mg        100 mg Oral Every 12 hours 06/07/23 1130 06/09/23 0959   06/07/23 1230  amoxicillin-clavulanate (AUGMENTIN) 875-125 MG per tablet 1 tablet        1 tablet Oral Every 12 hours 06/07/23 1130 06/09/23 0959   06/07/23 0800  ceFEPIme (MAXIPIME) 2 g in sodium chloride 0.9 % 100 mL IVPB  Status:  Discontinued        2 g 200 mL/hr over 30 Minutes Intravenous Every 8 hours 06/07/23 0758 06/07/23 1130   06/06/23 2130  ceFEPIme (MAXIPIME) 2 g in sodium chloride 0.9 % 100 mL IVPB  Status:  Discontinued        2 g 200 mL/hr  over 30 Minutes Intravenous Every 12 hours 06/06/23 1233 06/07/23 0758   06/04/23 1100  vancomycin (VANCOREADY) IVPB 750 mg/150 mL  Status:  Discontinued        750 mg 150 mL/hr over 60 Minutes Intravenous 2 times daily 06/04/23 0919 06/07/23 1130   06/04/23 0930  ceFEPIme (MAXIPIME) 2 g in sodium chloride 0.9 % 100 mL IVPB  Status:  Discontinued        2 g 200 mL/hr over 30 Minutes Intravenous Every 8 hours 06/04/23 0915 06/06/23 1233   06/04/23 0115  vancomycin (VANCOREADY) IVPB 1500 mg/300 mL        1,500 mg 150 mL/hr over 120 Minutes Intravenous  Once 06/04/23 0112 06/04/23 0444   06/04/23 0115  ceFEPIme (MAXIPIME) 2 g in sodium chloride 0.9 % 100 mL IVPB        2 g 200 mL/hr over 30 Minutes Intravenous  Once 06/04/23 0112 06/04/23 0210       Objective: Vitals:   06/07/23 2005 06/08/23 0426  BP: 110/75 112/82  Pulse: 95 91  Resp: 18 18  Temp: 99.1 F (37.3 C) 98.3 F (36.8 C)  SpO2: 96% 100%   No intake or output data in the 24 hours ending 06/08/23 1142  Filed Weights   06/04/23 0943  Weight: 71.3 kg   Weight change:  Body mass index is 24.62 kg/m.    Physical Exam: General exam: Middle-aged African-American male.  Not in physical pain Skin: No rashes, lesions or ulcers. HEENT: Atraumatic, normocephalic, no obvious bleeding Lungs: Breathing better.  No crackles.  No wheezing. CVS: Regular rate and rhythm, no murmur GI/Abd soft, nontender, nondistended, bowel sound present CNS: Alert, awake, able to orientation questions Psychiatry: Sad affect Extremities: 1+ bilateral pedal edema improving  Data Review: I have personally reviewed the laboratory data and studies available.  F/u labs ordered Unresulted Labs (From admission, onward)    None       Total time spent in review of labs and imaging, patient evaluation, formulation of plan, documentation and communication with family: 25 minutes  Signed, Lorin Glass, MD [facility] 06/08/2023

## 2023-06-08 NOTE — TOC Initial Note (Signed)
Transition of Care [facility]) - Initial/Assessment Note    Patient Details  Name: Craig Schmidt MRN: [id number] Date of Birth: [birth year]  [facility] CM/SW Contact:    Deatra Robinson, [place] Phone Number: 06/08/2023, 10:41 AM  Clinical Narrative:   spoke with pt re PT recommendation for SNF. Pt with recent admission to [facility] and had a bed available at [facility] before leaving hospital AMA. Reviewed SNF placement process again and answered questions. Pt reports agreeable to SNF. Will f/u with offers as available.   Dellie Burns, MSW, LCSW [phone] (coverage)                  Expected Discharge Plan: Skilled Nursing Facility Barriers to Discharge: Continued Medical Work up, SNF Pending bed offer, Insurance Authorization   Patient Goals and CMS Choice   CMS Medicare.gov Compare Post Acute Care list provided to:: Patient Choice offered to / list presented to : Patient [place] ownership interest in [facility].provided to:: Patient    Expected Discharge Plan and Services     Post Acute Care Choice: Skilled Nursing Facility Living arrangements for the past 2 months: Homeless                                      Prior Living Arrangements/Services Living arrangements for the past 2 months: Homeless Lives with:: Significant Other Patient language and need for interpreter reviewed:: Yes        Need for Family Participation in Patient Care: Yes (Comment) Care giver support system in place?: Yes (comment)   Criminal Activity/Legal Involvement Pertinent to Current Situation/Hospitalization: No - Comment as needed  Activities of Daily Living   ADL Screening (condition at time of admission) Independently performs ADLs?: No Does the patient have a NEW difficulty with bathing/dressing/toileting/self-feeding that is expected to last >3 days?: Yes (Initiates electronic notice to provider for possible OT consult) Does the patient  have a NEW difficulty with getting in/out of bed, walking, or climbing stairs that is expected to last >3 days?: Yes (Initiates electronic notice to provider for possible PT consult) Does the patient have a NEW difficulty with communication that is expected to last >3 days?: No Is the patient deaf or have difficulty hearing?: No Does the patient have difficulty seeing, even when wearing glasses/contacts?: Yes Does the patient have difficulty concentrating, remembering, or making decisions?: No  Permission Sought/Granted Permission sought to share information with : Oceanographer granted to share information with : Yes, Verbal Permission Granted              Emotional Assessment Appearance:: Appears older than stated age     Orientation: : Oriented to Self, Oriented to Place, Oriented to  Time, Oriented to Situation   Psych Involvement: No (comment)  Admission diagnosis:  Pneumonia [J18.9] Patient Active Problem List   Diagnosis Date Noted   Troponin I above reference range 05/25/2023   CAP (community acquired pneumonia) 05/25/2023   Pneumonia 05/25/2023   Acute on chronic combined systolic and diastolic heart failure (HCC) 12/03/2022   Malnutrition of moderate degree 11/30/2022   Acute on chronic combined systolic and diastolic CHF (congestive heart failure) (HCC) 11/29/2022   Acute on chronic combined systolic (congestive) and diastolic (congestive) heart failure (HCC) 11/28/2022   ACS (acute coronary syndrome) (HCC) 11/28/2022   Back pain 11/28/2022   Essential hypertension 11/28/2022   Dyslipidemia  11/28/2022   Noncompliance with medication regimen 11/28/2022   ST elevation myocardial infarction (STEMI) of inferolateral wall, subsequent episode of care (HCC) 12/19/2021   ST elevation myocardial infarction (STEMI) (HCC)    Ischemic cardiomyopathy    Cocaine abuse (HCC)    Smoker    Alcohol abuse    CVA (cerebral vascular accident) (HCC)  09/08/2021   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 [place] [address] Phone: [phone] Fax: [phone]  [id number] [place], [place] - [address] [place] [address] Phone: [phone] Fax: [phone]  [facility] [place], [place] - [address] [place] [address] Phone: [phone] Fax: [phone]  [facility] [address] [place] [address] Phone: [phone] Fax: [phone]     Social Determinants of Health (SDOH) Social History: SDOH Screenings   Food Insecurity: Food Insecurity Present (06/04/2023)  Housing: High Risk (06/04/2023)  Transportation Needs: Unmet Transportation Needs (06/04/2023)  Utilities: At Risk (06/04/2023)  Alcohol Screen: High Risk (09/19/2021)  Depression (PHQ2-9): High Risk (06/13/2022)  Financial Resource Strain: High Risk (09/07/2022)  Stress: Stress Concern Present (10/16/2022)  Tobacco Use: High Risk (06/04/2023)   SDOH Interventions:     Readmission Risk Interventions     No data to display

## 2023-06-09 DIAGNOSIS — J189 Pneumonia, unspecified organism: Secondary | ICD-10-CM | POA: Diagnosis not present

## 2023-06-09 LAB — CULTURE, BLOOD (ROUTINE X 2)
Culture: NO GROWTH
Culture: NO GROWTH
Special Requests: ADEQUATE

## 2023-06-09 NOTE — Progress Notes (Signed)
PROGRESS NOTE  Barren Goldson Camilli  DOB: [birth year]  PCP: Rema Fendt, NP GEX:[id number]  DOA: 06/04/2023  LOS: 4 days  Hospital Day: 6  Brief narrative: Craig Schmidt is a 52 y.o. male with PMH significant for homelessness for 30 years, polysubstance abuse (cocaine, alcohol), medical noncompliance, HTN, HLD, stroke, CAD/STEMI, cardiomyopathy, chronic systolic CHF, CKD who was recently hospitalized 11/2 for pneumonia and left AMA on 11/8.  During that hospitalization, there was also suspicion of stroke but neurology reviewed imaging and deemed it as an artifact.  Social worker while in process of finding a SNF for him but patient decided to leave AMA. At the time of discharge, patient was given prescription for oral doxycycline as a courtesy.   Last night, patient was brought to ED by EMS with complaint of chest pain that started around 1 PM in the afternoon.  Patient used to be transported to hospital at the time.  He continued to chest pain and also associated with shortness of breath, chest tightness and called EMS again in the night to be brought to the hospital. With EMS, his vital signs were stable, CBG 181 En route to the ED, he was given aspirin 324 milligram and nitroglycerin.  In the ED, he was afebrile, tachycardic to low 100s, blood pressure in 130s, tachypneic to 30s, O2 sat down to 80% at one point requiring 15 L oxygen by nasal cannula. Initial labs with WBC count 18.8, hemoglobin 10.9, BNP more than 1100, troponin mildly elevated to 19, Renal function normal, lactic acid level normal CT angio chest did not show any evidence of pulm embolism.  It showed progressive multilobar bilateral pneumonia, most severe in the lower lobes of the lungs bilaterally. Patient was started on IV cefepime, IV vancomycin Admitted to [facility]  Subjective: Patient was seen and examined this morning.   Lying on bed.  Not in distress no new symptoms. Complains of back pain and asking for lumbar  support belt.  Also asking for compression stockings on both legs. Pending SNF  Assessment/Plan: Sepsis POA Multifocal pneumonia Recently hospitalized with pneumonia and left AMA without completion of treatment. Presented with progressive shortness of breath No fever, tachycardic, tachypneic, had leukocytosis and elevated procalcitonin level Chest imaging showed progressive multilobar bilateral pneumonia most severe in the lower lobes bilaterally Blood culture did not any growth Initially treated with IV cefepime and IV vancomycin.  Switched to amoxicillin and doxycycline to complete on 11/17. Continue to monitor. Recent Labs  Lab 06/04/23 0133 06/04/23 0335 06/04/23 1059 06/05/23 0456 06/06/23 0923 06/07/23 0515 06/08/23 0505  WBC 18.8*  --   --  16.9* 15.6* 14.2* 10.6*  LATICACIDVEN  --  1.5 1.0  --   --   --   --   PROCALCITON  --  0.14  --   --   --   --   --    Acute on chronic systolic CHF Cardiomyopathy Presented with shortness of breath, bilateral lower extremity edema BNP elevated more than 1100 Echocardiogram 11/4 showed EF less than 20%, severely decreased function, global hypokinesis, grade 2 diastolic dysfunction, moderate to severely dilated LA.  EF on echo from May 2024 that showed 25 to 30%. PTA meds-he has been noncompliant to GDMT with Coreg, Jardiance, metolazone, Imdur, Lasix Currently on Coreg 3.125 mg twice daily.  Heart rate 90s, blood pressure in low 100s.  Continue to monitor. TED stockings ordered.  Acute respiratory failure with hypoxia Chest wall pain In the setting of multifocal  pneumonia and shallow breathing due to pleuritic chest wall pain likely due to pneumonia.  No evidence of rib fractures in imagings. Was not on supplemental oxygen at baseline. Required up to 15 liters in the ED. Gradually weaned down. Currently on room air. For pain control, continue lidocaine patch. Avoid IV opioids minimize the frequency of PRN oxycodone. Patient asked  for lumbar support previous of upper back pain.  Ordered.  H/o CAD/STEMI Noncompliant to Plavix and statin.  Currently resumed on both.  H/o stroke Patient has mild chronic right upper extremity weakness from prior strokes. Recent hospitalization, imagings raised suspicion of stroke but neurology reviewed the imagings and deemed them to be artifact. In the past, patient was on anticoagulation with Coumadin but he was not compliant to it. Continue Plavix and statin   CKD 2 Creatinine at baseline Recent Labs    03/07/23 1202 05/25/23 1339 05/25/23 2032 05/26/23 0455 05/29/23 1323 05/31/23 0749 06/04/23 0133 06/05/23 0456 06/06/23 0923 06/07/23 0515 06/08/23 0505  BUN 20 22*  --  18 19 13 13 15 17 17 15   CREATININE 1.80* 1.48* 1.53* 1.32* 1.48* 1.32* 1.22 1.30* 1.52* 1.23 1.23   Homelessness Social work to follow-up  polysubstance abuse (cocaine, alcohol) Counseled to quit. Last alcohol drink was prior to presentation. Patient did not have any withdrawal symptoms in the hospital  Impaired mobility PT eval obtained.   SNF recommended  Goals of care   Code Status: Full Code    DVT prophylaxis:  enoxaparin (LOVENOX) injection 40 mg Start: 06/04/23 1000   Antimicrobials: Oral antibiotics Fluid: None Consultants: None Family Communication: None at bedside  Status: Observation Level of care:  Telemetry Medical   Patient is from: Homeless Needs to continue in-hospital care: Pending SNF.  Medically stable for discharge    Diet:  Diet Order             Diet 2 gram sodium Room service appropriate? Yes; Fluid consistency: Thin  Diet effective now                   Scheduled Meds:  atorvastatin  40 mg Oral Daily   carvedilol  3.125 mg Oral BID   clopidogrel  75 mg Oral Daily   enoxaparin (LOVENOX) injection  40 mg Subcutaneous Daily   folic acid  1 mg Oral Daily   lidocaine  1 patch Transdermal Q24H   multivitamin with minerals  1 tablet Oral Daily    thiamine  100 mg Oral Daily   Or   thiamine  100 mg Intravenous Daily    PRN meds: acetaminophen **OR** acetaminophen, albuterol, hydrALAZINE, ondansetron (ZOFRAN) IV, oxyCODONE   Infusions:     Antimicrobials: Anti-infectives (From admission, onward)    Start     Dose/Rate Route Frequency Ordered Stop   06/07/23 1230  doxycycline (VIBRA-TABS) tablet 100 mg        100 mg Oral Every 12 hours 06/07/23 1130 06/08/23 2212   06/07/23 1230  amoxicillin-clavulanate (AUGMENTIN) 875-125 MG per tablet 1 tablet        1 tablet Oral Every 12 hours 06/07/23 1130 06/08/23 2212   06/07/23 0800  ceFEPIme (MAXIPIME) 2 g in sodium chloride 0.9 % 100 mL IVPB  Status:  Discontinued        2 g 200 mL/hr over 30 Minutes Intravenous Every 8 hours 06/07/23 0758 06/07/23 1130   06/06/23 2130  ceFEPIme (MAXIPIME) 2 g in sodium chloride 0.9 % 100 mL IVPB  Status:  Discontinued  2 g 200 mL/hr over 30 Minutes Intravenous Every 12 hours 06/06/23 1233 06/07/23 0758   06/04/23 1100  vancomycin (VANCOREADY) IVPB 750 mg/150 mL  Status:  Discontinued        750 mg 150 mL/hr over 60 Minutes Intravenous 2 times daily 06/04/23 0919 06/07/23 1130   06/04/23 0930  ceFEPIme (MAXIPIME) 2 g in sodium chloride 0.9 % 100 mL IVPB  Status:  Discontinued        2 g 200 mL/hr over 30 Minutes Intravenous Every 8 hours 06/04/23 0915 06/06/23 1233   06/04/23 0115  vancomycin (VANCOREADY) IVPB 1500 mg/300 mL        1,500 mg 150 mL/hr over 120 Minutes Intravenous  Once 06/04/23 0112 06/04/23 0444   06/04/23 0115  ceFEPIme (MAXIPIME) 2 g in sodium chloride 0.9 % 100 mL IVPB        2 g 200 mL/hr over 30 Minutes Intravenous  Once 06/04/23 0112 06/04/23 0210       Objective: Vitals:   06/09/23 0336 06/09/23 0826  BP: 110/75 109/68  Pulse: 89 97  Resp: 18   Temp: 98.3 F (36.8 C) 99.1 F (37.3 C)  SpO2: 100% 100%   No intake or output data in the 24 hours ending 06/09/23 1434  Filed Weights   06/04/23 0943   Weight: 71.3 kg   Weight change:  Body mass index is 24.62 kg/m.   Physical Exam: General exam: Middle-aged African-American male.  Not in physical pain Skin: No rashes, lesions or ulcers. HEENT: Atraumatic, normocephalic, no obvious bleeding Lungs: Breathing better.  No crackles.  No wheezing. CVS: Regular rate and rhythm, no murmur GI/Abd soft, nontender, nondistended, bowel sound present CNS: Alert, awake, able to orientation questions Psychiatry: Sad affect Extremities: 1+ bilateral pedal edema improving  Data Review: I have personally reviewed the laboratory data and studies available.  F/u labs ordered Unresulted Labs (From admission, onward)    None       Total time spent in review of labs and imaging, patient evaluation, formulation of plan, documentation and communication with family: 25 minutes  Signed, Lorin Glass, MD [facility] 06/09/2023

## 2023-06-09 NOTE — Progress Notes (Signed)
Mobility Specialist Progress Note:   06/09/23 1145  Mobility  Activity Ambulated with assistance in hallway  Level of Assistance Standby assist, set-up cues, supervision of patient - no hands on  Assistive Device Cane  Distance Ambulated (ft) 250 ft  Activity Response Tolerated well  Mobility Referral Yes  $Mobility charge 1 Mobility  Mobility Specialist Start Time (ACUTE ONLY) 1137  Mobility Specialist Stop Time (ACUTE ONLY) 1146  Mobility Specialist Time Calculation (min) (ACUTE ONLY) 9 min   Pt received in bed, medicated for back pain prior to therapy. Agreeable to mobility session, and pleasant today. Ambulated in hallway with cane and SBA. Attempted CGA but pt refused, gait belt donned. Tolerated well, c/o back pain. Took 2 standing rest breaks upon return to room, all needs met, call bell in reach.   Feliciana Rossetti Mobility Specialist Please contact via Special educational needs teacher or  Rehab office at [phone]

## 2023-06-09 NOTE — Plan of Care (Signed)

## 2023-06-09 NOTE — Progress Notes (Signed)
Mobility Specialist Progress Note:   06/09/23 0949  Mobility  Activity Ambulated with assistance to bathroom  Level of Assistance Modified independent, requires aide device or extra time  Assistive Device Front wheel walker  Distance Ambulated (ft) 12 ft  Activity Response Tolerated well  Mobility Referral Yes  $Mobility charge 1 Mobility  Mobility Specialist Start Time (ACUTE ONLY) 0945  Mobility Specialist Stop Time (ACUTE ONLY) 0949  Mobility Specialist Time Calculation (min) (ACUTE ONLY) 4 min   Pt requested assistance to go to bathroom. Ambulated with RW, CGA for safety. Tolerated well, c/o lower back pain. Reminded pt to pull bathroom light when finished, pt voiced understanding.   Feliciana Rossetti Mobility Specialist Please contact via Special educational needs teacher or  Rehab office at [phone]

## 2023-06-10 ENCOUNTER — Other Ambulatory Visit (HOSPITAL_COMMUNITY): Payer: Self-pay

## 2023-06-10 ENCOUNTER — Emergency Department (HOSPITAL_COMMUNITY): Payer: Medicaid Other

## 2023-06-10 ENCOUNTER — Inpatient Hospital Stay (HOSPITAL_COMMUNITY)
Admission: EM | Admit: 2023-06-10 | Discharge: 2023-06-13 | DRG: 193 | Disposition: A | Discharge status: Home / Self Care | Payer: 59 | Attending: Family Medicine | Admitting: Family Medicine

## 2023-06-10 ENCOUNTER — Other Ambulatory Visit: Payer: Self-pay

## 2023-06-10 ENCOUNTER — Encounter (HOSPITAL_COMMUNITY): Payer: Self-pay

## 2023-06-10 DIAGNOSIS — F1010 Alcohol abuse, uncomplicated: Secondary | ICD-10-CM | POA: Diagnosis present

## 2023-06-10 DIAGNOSIS — Z6822 Body mass index (BMI) 22.0-22.9, adult: Secondary | ICD-10-CM

## 2023-06-10 DIAGNOSIS — N182 Chronic kidney disease, stage 2 (mild): Secondary | ICD-10-CM | POA: Diagnosis present

## 2023-06-10 DIAGNOSIS — R079 Chest pain, unspecified: Principal | ICD-10-CM

## 2023-06-10 DIAGNOSIS — Z8673 Personal history of transient ischemic attack (TIA), and cerebral infarction without residual deficits: Secondary | ICD-10-CM

## 2023-06-10 DIAGNOSIS — E43 Unspecified severe protein-calorie malnutrition: Secondary | ICD-10-CM | POA: Diagnosis present

## 2023-06-10 DIAGNOSIS — I130 Hypertensive heart and chronic kidney disease with heart failure and stage 1 through stage 4 chronic kidney disease, or unspecified chronic kidney disease: Secondary | ICD-10-CM | POA: Diagnosis present

## 2023-06-10 DIAGNOSIS — Z86011 Personal history of benign neoplasm of the brain: Secondary | ICD-10-CM

## 2023-06-10 DIAGNOSIS — Z1152 Encounter for screening for COVID-19: Secondary | ICD-10-CM

## 2023-06-10 DIAGNOSIS — E871 Hypo-osmolality and hyponatremia: Secondary | ICD-10-CM | POA: Diagnosis present

## 2023-06-10 DIAGNOSIS — J45909 Unspecified asthma, uncomplicated: Secondary | ICD-10-CM | POA: Diagnosis present

## 2023-06-10 DIAGNOSIS — D75839 Thrombocytosis, unspecified: Secondary | ICD-10-CM | POA: Diagnosis present

## 2023-06-10 DIAGNOSIS — D649 Anemia, unspecified: Secondary | ICD-10-CM | POA: Diagnosis present

## 2023-06-10 DIAGNOSIS — Z91010 Allergy to peanuts: Secondary | ICD-10-CM

## 2023-06-10 DIAGNOSIS — M549 Dorsalgia, unspecified: Secondary | ICD-10-CM | POA: Diagnosis present

## 2023-06-10 DIAGNOSIS — R7989 Other specified abnormal findings of blood chemistry: Secondary | ICD-10-CM | POA: Diagnosis not present

## 2023-06-10 DIAGNOSIS — F17200 Nicotine dependence, unspecified, uncomplicated: Secondary | ICD-10-CM | POA: Diagnosis present

## 2023-06-10 DIAGNOSIS — I2510 Atherosclerotic heart disease of native coronary artery without angina pectoris: Secondary | ICD-10-CM | POA: Diagnosis present

## 2023-06-10 DIAGNOSIS — Z7901 Long term (current) use of anticoagulants: Secondary | ICD-10-CM

## 2023-06-10 DIAGNOSIS — E785 Hyperlipidemia, unspecified: Secondary | ICD-10-CM | POA: Diagnosis present

## 2023-06-10 DIAGNOSIS — J9601 Acute respiratory failure with hypoxia: Secondary | ICD-10-CM

## 2023-06-10 DIAGNOSIS — J189 Pneumonia, unspecified organism: Principal | ICD-10-CM | POA: Diagnosis present

## 2023-06-10 DIAGNOSIS — I5042 Chronic combined systolic (congestive) and diastolic (congestive) heart failure: Secondary | ICD-10-CM | POA: Diagnosis present

## 2023-06-10 DIAGNOSIS — I429 Cardiomyopathy, unspecified: Secondary | ICD-10-CM | POA: Diagnosis present

## 2023-06-10 DIAGNOSIS — I5023 Acute on chronic systolic (congestive) heart failure: Secondary | ICD-10-CM | POA: Diagnosis not present

## 2023-06-10 DIAGNOSIS — Z5900 Homelessness unspecified: Secondary | ICD-10-CM

## 2023-06-10 DIAGNOSIS — Z9889 Other specified postprocedural states: Secondary | ICD-10-CM

## 2023-06-10 DIAGNOSIS — F17210 Nicotine dependence, cigarettes, uncomplicated: Secondary | ICD-10-CM | POA: Diagnosis present

## 2023-06-10 DIAGNOSIS — Z5982 Transportation insecurity: Secondary | ICD-10-CM

## 2023-06-10 DIAGNOSIS — A419 Sepsis, unspecified organism: Secondary | ICD-10-CM | POA: Diagnosis not present

## 2023-06-10 DIAGNOSIS — I10 Essential (primary) hypertension: Secondary | ICD-10-CM | POA: Diagnosis present

## 2023-06-10 DIAGNOSIS — D631 Anemia in chronic kidney disease: Secondary | ICD-10-CM | POA: Diagnosis present

## 2023-06-10 DIAGNOSIS — Z91013 Allergy to seafood: Secondary | ICD-10-CM

## 2023-06-10 DIAGNOSIS — I252 Old myocardial infarction: Secondary | ICD-10-CM

## 2023-06-10 DIAGNOSIS — Z79899 Other long term (current) drug therapy: Secondary | ICD-10-CM

## 2023-06-10 DIAGNOSIS — T502X5A Adverse effect of carbonic-anhydrase inhibitors, benzothiadiazides and other diuretics, initial encounter: Secondary | ICD-10-CM | POA: Diagnosis present

## 2023-06-10 DIAGNOSIS — Z87892 Personal history of anaphylaxis: Secondary | ICD-10-CM

## 2023-06-10 DIAGNOSIS — Z7902 Long term (current) use of antithrombotics/antiplatelets: Secondary | ICD-10-CM

## 2023-06-10 DIAGNOSIS — I5043 Acute on chronic combined systolic (congestive) and diastolic (congestive) heart failure: Secondary | ICD-10-CM | POA: Diagnosis present

## 2023-06-10 DIAGNOSIS — E875 Hyperkalemia: Secondary | ICD-10-CM | POA: Diagnosis present

## 2023-06-10 DIAGNOSIS — Z5941 Food insecurity: Secondary | ICD-10-CM

## 2023-06-10 DIAGNOSIS — F1410 Cocaine abuse, uncomplicated: Secondary | ICD-10-CM | POA: Diagnosis present

## 2023-06-10 DIAGNOSIS — Z833 Family history of diabetes mellitus: Secondary | ICD-10-CM

## 2023-06-10 LAB — CBC WITH DIFFERENTIAL/PLATELET
Abs Immature Granulocytes: 0.09 10*3/uL — ABNORMAL HIGH (ref 0.00–0.07)
Basophils Absolute: 0.1 10*3/uL (ref 0.0–0.1)
Basophils Relative: 0 %
Eosinophils Absolute: 0.4 10*3/uL (ref 0.0–0.5)
Eosinophils Relative: 3 %
HCT: 37.1 % — ABNORMAL LOW (ref 39.0–52.0)
Hemoglobin: 11.5 g/dL — ABNORMAL LOW (ref 13.0–17.0)
Immature Granulocytes: 1 %
Lymphocytes Relative: 15 %
Lymphs Abs: 1.9 10*3/uL (ref 0.7–4.0)
MCH: 29.3 pg (ref 26.0–34.0)
MCHC: 31 g/dL (ref 30.0–36.0)
MCV: 94.6 fL (ref 80.0–100.0)
Monocytes Absolute: 0.6 10*3/uL (ref 0.1–1.0)
Monocytes Relative: 5 %
Neutro Abs: 9.4 10*3/uL — ABNORMAL HIGH (ref 1.7–7.7)
Neutrophils Relative %: 76 %
Platelets: 540 10*3/uL — ABNORMAL HIGH (ref 150–400)
RBC: 3.92 MIL/uL — ABNORMAL LOW (ref 4.22–5.81)
RDW: 14.3 % (ref 11.5–15.5)
WBC: 12.4 10*3/uL — ABNORMAL HIGH (ref 4.0–10.5)
nRBC: 0 % (ref 0.0–0.2)

## 2023-06-10 MED ORDER — FUROSEMIDE 40 MG PO TABS
80.0000 mg | ORAL_TABLET | Freq: Every day | ORAL | 6 refills | Status: DC
  Filled 2023-06-10: qty 60, 30d supply, fill #0

## 2023-06-10 MED ORDER — CLOPIDOGREL BISULFATE 75 MG PO TABS
75.0000 mg | ORAL_TABLET | Freq: Every day | ORAL | 6 refills | Status: DC
  Filled 2023-06-10: qty 30, 30d supply, fill #0

## 2023-06-10 MED ORDER — ATORVASTATIN CALCIUM 40 MG PO TABS
40.0000 mg | ORAL_TABLET | Freq: Every day | ORAL | 6 refills | Status: DC
  Filled 2023-06-10: qty 30, 30d supply, fill #0

## 2023-06-10 MED ORDER — IPRATROPIUM-ALBUTEROL 0.5-2.5 (3) MG/3ML IN SOLN
3.0000 mL | Freq: Once | RESPIRATORY_TRACT | Status: AC
  Administered 2023-06-11: 3 mL via RESPIRATORY_TRACT
  Filled 2023-06-10: qty 3

## 2023-06-10 MED ORDER — CARVEDILOL 3.125 MG PO TABS
3.1250 mg | ORAL_TABLET | Freq: Two times a day (BID) | ORAL | 6 refills | Status: DC
  Filled 2023-06-10: qty 60, 30d supply, fill #0

## 2023-06-10 MED ORDER — APIXABAN 5 MG PO TABS: 5.0000 mg | ORAL_TABLET | Freq: Two times a day (BID) | ORAL | Status: DC

## 2023-06-10 NOTE — Discharge Summary (Signed)
Physician Discharge Summary  Craig Schmidt ZOX:[id number] DOB: 12-11-1970 DOA: 06/04/2023  PCP: Rema Fendt, NP  Admit date: 06/04/2023 Discharge date: 06/10/2023  Admitted From: Home Disposition:  Home  Recommendations for Outpatient Follow-up:  Follow up with PCP in 1-2 weeks Please obtain BMP/CBC in one week   Home Health:No Equipment/Devices:None  Discharge Condition:Guarded CODE STATUS:Full Diet recommendation: Heart Healthy    Brief/Interim Summary: 52 y.o. male past medical history significant for homelessness, polysubstance abuse (cocaine and alcohol), noncompliance with his medication, history of hypertension stroke CAD STEMI chronic systolic heart failure, chronic kidney disease, he was recently in the hospital and left AMA on 05/31/2023 during that time he had multifocal pneumonia he was given a prescription of doxycycline blood in the day prior to admission for chest pain that started around 1 PM accompanied by shortness of breath in the ED he was found to be tachypneic satting 80% placed on 15 L of oxygen with a white count of 18 hemoglobin of 11 CT angio of the chest showed no PE but it did show progressive multifocal pneumoni   Discharge Diagnoses:  Principal Problem:   Pneumonia Active Problems:   Sepsis (HCC)   Protein-calorie malnutrition, severe   Cocaine abuse (HCC)   Essential hypertension   Noncompliance with medication regimen  Severe sepsis present on admission due to multifocal pneumonia: Started empirically on IV Vanco and cefepime now switch to amoxicillin and Doxy. He has completed course in house.   Acute on chronic systolic heart failure: 2D echo showed an EF of 20% has been noncompliant with GDMT. Currently on Coreg, and her blood pressure has remained relatively stable. Will continue Coreg hold BiDil and Lasix follow-up PCP check blood pressure and titrate heart failure medications as tolerated.   Acute respiratory failure with  hypoxia: In the setting of multifocal pneumonia. 15 L on admission now has been weaned to room air. Hold opioids IV.     History of CAD/STEMI: Continue statin and Plavix. He has been noncompliant with his medication. No changes to his medication he was given refills.   History of stroke: Mild right upper extremity weakness from prior stroke.   Chronic kidney disease stage II: Her creatinine has remained at baseline.   Homelessness: Child psychotherapist has been consulted.   Polysubstance abuse alcohol and cocaine: She relates she quit last drink was prior to presentation. Is not exhibiting any withdrawal symptoms. Started on thiamine and folate monitor with CIWA.   Impaired mobility: PT OT evaluated the patient, did not qualify for skilled.  He has to be discharged.  Discharge Instructions  Discharge Instructions     Diet - low sodium heart healthy   Complete by: As directed    Increase activity slowly   Complete by: As directed       Allergies as of 06/10/2023       Reactions   Shellfish Allergy Anaphylaxis   Peanut (diagnostic) Itching        Medication List     STOP taking these medications    doxycycline 100 MG tablet Commonly known as: VIBRA-TABS   isosorbide-hydrALAZINE 20-37.5 MG tablet Commonly known as: BiDil   potassium chloride SA 20 MEQ tablet Commonly known as: KLOR-CON M   triamcinolone ointment 0.5 % Commonly known as: KENALOG       TAKE these medications    atorvastatin 40 MG tablet Commonly known as: LIPITOR Take 1 tablet (40 mg total) by mouth daily.   carvedilol 3.125 MG tablet Commonly  known as: COREG Take 1 tablet (3.125 mg total) by mouth 2 (two) times daily.   clopidogrel 75 MG tablet Commonly known as: PLAVIX Take 1 tablet (75 mg total) by mouth daily.   Eliquis 5 MG Tabs tablet Generic drug: apixaban Take 1 tablet (5 mg total) by mouth 2 (two) times daily.   furosemide 40 MG tablet Commonly known as: LASIX Take  2 tablets (80 mg total) by mouth daily.        Allergies  Allergen Reactions   Shellfish Allergy Anaphylaxis   Peanut (Diagnostic) Itching    Consultations: No complaints   Procedures/Studies: CT Angio Chest PE W and/or Wo Contrast  Result Date: 06/04/2023 CLINICAL DATA:  52 year old male with history of chest pain. Suspected pulmonary embolism. EXAM: CT ANGIOGRAPHY CHEST WITH CONTRAST TECHNIQUE: Multidetector CT imaging of the chest was performed using the standard protocol during bolus administration of intravenous contrast. Multiplanar CT image reconstructions and MIPs were obtained to evaluate the vascular anatomy. RADIATION DOSE REDUCTION: This exam was performed according to the departmental dose-optimization program which includes automated exposure control, adjustment of the mA and/or kV according to patient size and/or use of iterative reconstruction technique. CONTRAST:  75mL OMNIPAQUE IOHEXOL 350 MG/ML SOLN COMPARISON:  Chest CT 05/30/2023. FINDINGS: Cardiovascular: There are no filling defects in the pulmonary arterial tree to suggest pulmonary embolism. Heart size is enlarged with left ventricular dilatation. There is no significant pericardial fluid, thickening or pericardial calcification. No atherosclerotic calcifications are noted in the thoracic aorta or the coronary arteries. Mediastinum/Nodes: Multiple prominent borderline enlarged mediastinal and hilar lymph nodes, nonspecific, but likely reactive. Esophagus is unremarkable in appearance. No axillary lymphadenopathy. Lungs/Pleura: Worsening patchy areas of airspace consolidation are noted throughout the lungs bilaterally, most evident in the mid to lower lungs, particularly in the lower lobes of the lungs where there is extensive ground-glass attenuation, peribronchovascular consolidation in more dense areas of mass-like consolidation, indicative of progressive multilobar bilateral pneumonia. Trace bilateral pleural effusions  are also noted. Upper Abdomen: Unremarkable. Musculoskeletal: There are no aggressive appearing lytic or blastic lesions noted in the visualized portions of the skeleton. Review of the MIP images confirms the above findings. IMPRESSION: 1. No evidence of pulmonary embolism. 2. Progressive multilobar bilateral pneumonia, most severe in the lower lobes of the lungs bilaterally. 3. Trace bilateral parapneumonic pleural effusions. 4. Cardiomegaly with left ventricular dilatation. Electronically Signed   By: Trudie Reed M.D.   On: 06/04/2023 05:46   DG Chest Portable 1 View  Result Date: 06/04/2023 CLINICAL DATA:  52 year old male with signs and symptoms of pneumonia. EXAM: PORTABLE CHEST 1 VIEW COMPARISON:  Chest x-ray 05/30/2023. FINDINGS: Lung volumes are slightly low. Extensive opacities throughout the mid to lower lungs bilaterally indicative of areas of atelectasis and/or consolidation, with superimposed small bilateral pleural effusions. No pneumothorax. No evidence of pulmonary edema. Heart size is mildly enlarged. The patient is rotated to the left on today's exam, resulting in distortion of the mediastinal contours and reduced diagnostic sensitivity and specificity for mediastinal pathology. IMPRESSION: 1. Low lung volumes with patchy areas of atelectasis and/or consolidation throughout the mid to lower lungs bilaterally and superimposed small bilateral pleural effusions. Electronically Signed   By: Trudie Reed M.D.   On: 06/04/2023 05:42   CT CHEST WO CONTRAST  Result Date: 05/30/2023 CLINICAL DATA:  Pneumonia, complication suspected, xray done Respiratory illness, nondiagnostic xray EXAM: CT CHEST WITHOUT CONTRAST TECHNIQUE: Multidetector CT imaging of the chest was performed following the standard protocol without IV  contrast. RADIATION DOSE REDUCTION: This exam was performed according to the departmental dose-optimization program which includes automated exposure control, adjustment of  the mA and/or kV according to patient size and/or use of iterative reconstruction technique. COMPARISON:  Radiograph earlier today. FINDINGS: Cardiovascular: Prominent cardiomegaly. No pericardial effusion. The thoracic aorta is normal in caliber. Mediastinum/Nodes: Shotty mediastinal lymph nodes, including 10 mm short axis anterior paratracheal node series 3, image 21. Prevascular node measures 11 mm series 3, image 23. Hilar assessment is limited in the absence of IV contrast. No esophageal wall thickening. Lungs/Pleura: Multifocal bilateral lower lobe consolidations, peripheral with ground-glass and consolidative components. Small air bronchograms in the anterior right lower lobe consolidation. Bandlike areas of subsegmental atelectasis or scarring within the anterior right middle lobe. Heterogeneous pulmonary parenchyma with mild apical emphysema. There are small bilateral pleural effusions, partially loculated on the left. Upper Abdomen: Hepatic steatosis. No acute findings in the included upper abdomen. Musculoskeletal: There is generalized body wall edema. Scoliosis and degenerative change in the spine. There are no acute or suspicious osseous abnormalities. IMPRESSION: 1. Multifocal bilateral lower lobe consolidations, peripheral with ground-glass and consolidative components. Findings are suspicious for multifocal pneumonia. Appearance can be seen in the setting of septic emboli. Recommend radiologic follow-up to resolution. 2. Small bilateral pleural effusions, partially loculated on the left. 3. Prominent cardiomegaly. 4. Shotty mediastinal lymph nodes, likely reactive. 5. Hepatic steatosis. 6. Generalized body wall edema. Emphysema (ICD10-J43.9). Electronically Signed   By: Narda Rutherford M.D.   On: 05/30/2023 22:36   DG Chest 2 View  Result Date: 05/30/2023 CLINICAL DATA:  Pneumonia EXAM: CHEST - 2 VIEW COMPARISON:  05/25/2023 FINDINGS: Frontal and lateral views of the chest demonstrates stable  enlargement of the cardiac silhouette. Persistent consolidation at the lung bases. Stable diffuse interstitial prominence. No effusion or pneumothorax. No acute bony abnormality. IMPRESSION: 1. Continued bibasilar airspace disease, consistent with given history of pneumonia. Given the persistence of the left lower lobe consolidation since 12/02/2022, follow-up CT may be useful as previously recommended. Electronically Signed   By: Sharlet Salina M.D.   On: 05/30/2023 14:35   VAS [place] LOWER EXTREMITY VENOUS (DVT)  Result Date: 05/27/2023  Lower Venous DVT Study Patient Name:  Craig Schmidt  Date of Exam:   05/27/2023 Medical Rec #: [id number]           Accession #:    [id number] Date of Birth: 26-Sep-1970            Patient Gender: M Patient Age:   38 years Exam Location:  [facility] Procedure:      VAS [place] LOWER EXTREMITY VENOUS (DVT) Referring Phys: [facility] GOEL --------------------------------------------------------------------------------  Indications: Swelling, and Edema.  Limitations: Patient involuntary movement. Comparison Study: Previous exam on 2.25.2023 Performing Technologist: Fernande Bras  Examination Guidelines: A complete evaluation includes B-mode imaging, spectral Doppler, color Doppler, and power Doppler as needed of all accessible portions of each vessel. Bilateral testing is considered an integral part of a complete examination. Limited examinations for reoccurring indications may be performed as noted. The reflux portion of the exam is performed with the patient in reverse Trendelenburg.  +---------+---------------+---------+-----------+----------+--------------+ RIGHT    CompressibilityPhasicitySpontaneityPropertiesThrombus Aging +---------+---------------+---------+-----------+----------+--------------+ CFV      Full           Yes      Yes                                 +---------+---------------+---------+-----------+----------+--------------+  SFJ      Full                                                         +---------+---------------+---------+-----------+----------+--------------+ FV Prox  Full                                                        +---------+---------------+---------+-----------+----------+--------------+ FV Mid   Full                                                        +---------+---------------+---------+-----------+----------+--------------+ FV DistalFull                                                        +---------+---------------+---------+-----------+----------+--------------+ PFV      Full                                                        +---------+---------------+---------+-----------+----------+--------------+ POP      Full           Yes      Yes                                 +---------+---------------+---------+-----------+----------+--------------+ PTV      Full                                                        +---------+---------------+---------+-----------+----------+--------------+ PERO     Full                                                        +---------+---------------+---------+-----------+----------+--------------+   +---------+---------------+---------+-----------+----------+--------------+ LEFT     CompressibilityPhasicitySpontaneityPropertiesThrombus Aging +---------+---------------+---------+-----------+----------+--------------+ CFV      Full           Yes      Yes                                 +---------+---------------+---------+-----------+----------+--------------+ SFJ      Full                                                        +---------+---------------+---------+-----------+----------+--------------+  FV Prox  Full                                                        +---------+---------------+---------+-----------+----------+--------------+ FV Mid   Full                                                         +---------+---------------+---------+-----------+----------+--------------+ FV DistalFull                                                        +---------+---------------+---------+-----------+----------+--------------+ PFV      Full                                                        +---------+---------------+---------+-----------+----------+--------------+ POP      Full           Yes      Yes                                 +---------+---------------+---------+-----------+----------+--------------+ PTV      Full                                                        +---------+---------------+---------+-----------+----------+--------------+ PERO     Full                                                        +---------+---------------+---------+-----------+----------+--------------+     Summary: BILATERAL: - No evidence of deep vein thrombosis seen in the lower extremities, bilaterally. -No evidence of popliteal cyst, bilaterally.   *See table(s) above for measurements and observations. Electronically signed by Sherald Hess MD on 05/27/2023 at 3:05:22 PM.    Final    VAS US CAROTID  Result Date: 05/27/2023 Carotid Arterial Duplex Study Patient Name:  Craig Schmidt  Date of Exam:   05/27/2023 Medical Rec #: [id number]           Accession #:    [id number] Date of Birth: 12-Feb-1971            Patient Gender: M Patient Age:   77 years Exam Location:  [facility] Procedure:      VAS US CAROTID Referring Phys: Lorin Glass --------------------------------------------------------------------------------  Indications:  CVA. Risk Factors: Prior MI. Limitations   Today's exam was limited due to Patient involuntary movement. Performing Technologist: Fernande Bras  Examination Guidelines: A complete evaluation includes B-mode imaging, spectral  Doppler, color Doppler, and power Doppler as needed of all accessible portions of each vessel. Bilateral testing  is considered an integral part of a complete examination. Limited examinations for reoccurring indications may be performed as noted.  Right Carotid Findings: +----------+--------+--------+--------+------------------+------------------+           PSV cm/sEDV cm/sStenosisPlaque DescriptionComments           +----------+--------+--------+--------+------------------+------------------+ CCA Prox  75      17                                                   +----------+--------+--------+--------+------------------+------------------+ CCA Distal67      30                                                   +----------+--------+--------+--------+------------------+------------------+ ICA Prox  61      21                                intimal thickening +----------+--------+--------+--------+------------------+------------------+ ICA Mid   56      28                                                   +----------+--------+--------+--------+------------------+------------------+ ICA Distal62      21                                                   +----------+--------+--------+--------+------------------+------------------+ ECA       79      19                                                   +----------+--------+--------+--------+------------------+------------------+ +----------+--------+-------+--------+-------------------+           PSV cm/sEDV cmsDescribeArm Pressure (mmHG) +----------+--------+-------+--------+-------------------+ Subclavian89      17                                 +----------+--------+-------+--------+-------------------+ +---------+--------+--+--------+--+ VertebralPSV cm/s50EDV cm/s19 +---------+--------+--+--------+--+  Left Carotid Findings: +----------+--------+--------+--------+------------------+------------------+           PSV cm/sEDV cm/sStenosisPlaque DescriptionComments            +----------+--------+--------+--------+------------------+------------------+ CCA Prox  90      31                                                   +----------+--------+--------+--------+------------------+------------------+ CCA Distal67      22                                                   +----------+--------+--------+--------+------------------+------------------+  ICA Prox  47      21                                intimal thickening +----------+--------+--------+--------+------------------+------------------+ ICA Mid   64      26                                                   +----------+--------+--------+--------+------------------+------------------+ ICA Distal69      34                                                   +----------+--------+--------+--------+------------------+------------------+ ECA       65      17                                                   +----------+--------+--------+--------+------------------+------------------+ +----------+--------+--------+--------+-------------------+           PSV cm/sEDV cm/sDescribeArm Pressure (mmHG) +----------+--------+--------+--------+-------------------+ [id number]      23                                  +----------+--------+--------+--------+-------------------+ +---------+--------+--+--------+--+ VertebralPSV cm/s59EDV cm/s27 +---------+--------+--+--------+--+   Summary: Right Carotid: The ECA appears <50% stenosed. The extracranial vessels were                near-normal with only minimal wall thickening or plaque. Left Carotid: The ECA appears <50% stenosed. The extracranial vessels were               near-normal with only minimal wall thickening or plaque. Vertebrals:  Bilateral vertebral arteries demonstrate antegrade flow. Subclavians: Normal flow hemodynamics were seen in bilateral subclavian              arteries. *See table(s) above for measurements and observations.   Electronically signed by Sherald Hess MD on 05/27/2023 at 3:05:03 PM.    Final    ECHOCARDIOGRAM COMPLETE BUBBLE STUDY  Result Date: 05/27/2023    ECHOCARDIOGRAM REPORT   Patient Name:   Craig Schmidt Date of Exam: 05/27/2023 Medical Rec #:  [id number]          Height:       67.0 in Accession #:    [id number]         Weight:       151.9 lb Date of Birth:  03/27/1971           BSA:          1.799 m Patient Age:    52 years           BP:           119/87 mmHg Patient Gender: M                  HR:           105 bpm. Exam Location:  Inpatient Procedure: 2D Echo, Cardiac Doppler, Color Doppler and Intracardiac  Opacification Agent Indications:    Stroke  History:        Patient has prior history of Echocardiogram examinations, most                 recent 11/29/2022. CHF, CAD and Previous Myocardial Infarction;                 Stroke.  Sonographer:    Darlys Gales Referring Phys: [id number] [facility]  Sonographer Comments: Patient had previous negative bubble study. IMPRESSIONS  1. Left ventricular ejection fraction, by estimation, is <20%. The left ventricle has severely decreased function. The left ventricle demonstrates global hypokinesis. The left ventricular internal cavity size was moderately dilated. Left ventricular diastolic parameters are consistent with Grade II diastolic dysfunction (pseudonormalization).  2. Right ventricular systolic function is at least moderately reduced. The right ventricular size is mildly enlarged.  3. Left atrial size is moderate to severely dilated.  4. The mitral valve is thickened with limited movement of the valve leaflets (cannot r/o rheumatic mitral valve stenosis). There is a mean gradient of consistent with severe mitral stenosis.  5. Tricuspid valve regurgitation is moderate to severe.  6. The aortic valve is normal in structure. Aortic valve regurgitation is not visualized. FINDINGS  Left Ventricle: Left ventricular ejection fraction, by estimation,  is <20%. The left ventricle has severely decreased function. The left ventricle demonstrates global hypokinesis. Definity contrast agent was given IV to delineate the left ventricular endocardial borders. The left ventricular internal cavity size was moderately dilated. There is no left ventricular hypertrophy. Left ventricular diastolic parameters are consistent with Grade II diastolic dysfunction (pseudonormalization). Right Ventricle: The right ventricular size is mildly enlarged. No increase in right ventricular wall thickness. Right ventricular systolic function is moderately reduced. Left Atrium: Left atrial size was moderately dilated. Right Atrium: Right atrial size was normal in size. Pericardium: There is no evidence of pericardial effusion. Mitral Valve: The mitral valve is abnormal. Moderate mitral valve regurgitation. Moderate to severe mitral valve stenosis. MV peak gradient, 19.2 mmHg. The mean mitral valve gradient is 10.7 mmHg. Tricuspid Valve: The tricuspid valve is normal in structure. Tricuspid valve regurgitation is moderate to severe. Aortic Valve: The aortic valve is normal in structure. Aortic valve regurgitation is not visualized. Aortic valve mean gradient measures 4.0 mmHg. Aortic valve peak gradient measures 5.8 mmHg. Aortic valve area, by VTI measures 2.93 cm. Pulmonic Valve: The pulmonic valve was not well visualized. Pulmonic valve regurgitation is not visualized. Aorta: The aortic root is normal in size and structure. IAS/Shunts: No atrial level shunt detected by color flow Doppler.  LEFT VENTRICLE PLAX 2D LVIDd:         5.20 cm LVIDs:         4.90 cm LV PW:         1.00 cm LV IVS:        1.00 cm LVOT diam:     1.90 cm LV SV:         42 LV SV Index:   23 LVOT Area:     2.84 cm  RIGHT VENTRICLE RV S prime:     8.16 cm/s LEFT ATRIUM              Index        RIGHT ATRIUM           Index LA Vol (A2C):   109.0 ml 60.59 ml/m  RA Area:     20.50 cm LA Vol (A4C):  68.4 ml  38.02 ml/m   RA Volume:   58.70 ml  32.63 ml/m LA Biplane Vol: 91.0 ml  50.58 ml/m  AORTIC VALVE AV Area (Vmax):    2.39 cm AV Area (Vmean):   2.43 cm AV Area (VTI):     2.93 cm AV Vmax:           120.00 cm/s AV Vmean:          93.400 cm/s AV VTI:            0.143 m AV Peak Grad:      5.8 mmHg AV Mean Grad:      4.0 mmHg LVOT Vmax:         101.00 cm/s LVOT Vmean:        80.100 cm/s LVOT VTI:          0.148 m LVOT/AV VTI ratio: 1.03  AORTA Ao Root diam: 2.60 cm MITRAL VALVE                TRICUSPID VALVE MV Area (PHT): 2.24 cm     TR Peak grad:   51.3 mmHg MV Area VTI:   1.21 cm     TR Vmax:        358.00 cm/s MV Peak grad:  19.2 mmHg MV Mean grad:  10.7 mmHg    SHUNTS MV Vmax:       2.19 m/s     Systemic VTI:  0.15 m MV Vmean:      158.3 cm/s   Systemic Diam: 1.90 cm MV Decel Time: 338 msec MV E velocity: 170.00 cm/s MV A velocity: 152.00 cm/s MV E/A ratio:  1.12 Aditya Sabharwal Electronically signed by Dorthula Nettles Signature Date/Time: 05/27/2023/9:47:42 AM    Final    MR Lumbar Spine W Wo Contrast  Result Date: 05/26/2023 CLINICAL DATA:  Back pain, history of drug use. Concern for discitis/osteomyelitis, abscess EXAM: MRI LUMBAR SPINE WITHOUT AND WITH CONTRAST TECHNIQUE: Multiplanar and multiecho pulse sequences of the lumbar spine were obtained without and with intravenous contrast. CONTRAST:  7mL GADAVIST GADOBUTROL 1 MMOL/ML IV SOLN COMPARISON:  Lumbar spine MRI 03/28/2022 FINDINGS: Segmentation: Standard; the lowest formed disc space is designated L5-S1. Alignment:  Normal. Vertebrae: Vertebral body heights are preserved. Background marrow signal is normal. There is no suspicious marrow signal abnormality or marrow edema. There is no evidence of discitis/osteomyelitis. Conus medullaris and cauda equina: Conus extends to the L1 level. Conus and cauda equina appear normal. Paraspinal and other soft tissues: Unremarkable. Disc levels: There is mild disc desiccation at L3-L4 and L4-L5 with mild bulges and mild  facet arthropathy at these levels. The other disc heights are preserved. There is no significant spinal canal or neural foraminal stenosis at any level. IMPRESSION: No evidence of discitis/osteomyelitis or significant degenerative change in the lumbar spine. Electronically Signed   By: Lesia Hausen M.D.   On: 05/26/2023 16:45   MR BRAIN WO CONTRAST  Result Date: 05/26/2023 CLINICAL DATA:  Neuro deficit, stroke suspected EXAM: MRI HEAD WITHOUT CONTRAST TECHNIQUE: Multiplanar, multiecho pulse sequences of the brain and surrounding structures were obtained without intravenous contrast. COMPARISON:  CT head 1 day prior FINDINGS: Brain: There are punctate foci of cortical diffusion restriction in the bilateral frontal lobes (5-80, 7-60) suspicious for tiny acute infarcts. There is no acute intracranial hemorrhage or extra-axial fluid collection There is age accelerated parenchymal volume loss with prominence of the ventricular system and extra-axial CSF spaces. There is multifocal encephalomalacia in the  bilateral frontal, parietal, and occipital lobes consistent with prior infarcts. There are additional small remote infarcts in the right caudate and left cerebellar hemisphere. There are associated curvilinear chronic blood products overlying the left cerebral hemisphere as well as a punctate chronic microhemorrhage in the brainstem, nonspecific. The pituitary and suprasellar region are normal. 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 There is no other mass lesion. There is no mass effect or midline shift. Vascular: Normal flow voids. Skull and upper cervical spine: Normal marrow signal. Sinuses/Orbits: The paranasal sinuses are clear. The globes and orbits are unremarkable. Other: The mastoid air cells and middle ear cavities are clear. IMPRESSION: 1. Two possible tiny acute cortical infarcts in the bilateral frontal lobes. 2. No other evidence of acute intracranial pathology.  The left frontoparietal infarct described on the CT head from 1 day prior is remote. 3. Numerous additional remote infarcts and age accelerated parenchymal volume loss as above. 4. Unchanged left vestibular schwannoma. Electronically Signed   By: Lesia Hausen M.D.   On: 05/26/2023 16:40   CT Head Wo Contrast  Result Date: 05/25/2023 CLINICAL DATA:  Neuro deficit, acute, stroke suspected Mental status change, unknown cause right arm paresthesia EXAM: CT HEAD WITHOUT CONTRAST TECHNIQUE: Contiguous axial images were obtained from the base of the skull through the vertex without intravenous contrast. RADIATION DOSE REDUCTION: This exam was performed according to the departmental dose-optimization program which includes automated exposure control, adjustment of the mA and/or kV according to patient size and/or use of iterative reconstruction technique. COMPARISON:  CT head 10/03/2021 FINDINGS: Brain: Cerebral ventricle sizes are concordant with the degree of cerebral volume loss. Interval development of loss of left frontal gray-white matter differentiation along the precentral gyrus and anteriorly suggestive of acute infarction. Finding extends minimally to the postcentral gyrus. No parenchymal hemorrhage. No mass lesion. No extra-axial collection. No mass effect or midline shift. No hydrocephalus. Basilar cisterns are patent. Mega cisterna magna. Vascular: No hyperdense vessel. Skull: No acute fracture or focal lesion. Sinuses/Orbits: Paranasal sinuses and mastoid air cells are clear. The orbits are unremarkable. Other: None. IMPRESSION: 1. Acute left frontoparietal infarction. Recommend MRI noncontrast for further evaluation 2. No acute intracranial hemorrhage. These results were called by telephone at the time of interpretation on 05/25/2023 at 3:58 pm to provider Rexford Maus, DO, who verbally acknowledged these results. Electronically Signed   By: Tish Frederickson M.D.   On: 05/25/2023 15:58   DG Chest  2 View  Result Date: 05/25/2023 CLINICAL DATA:  Chest pain EXAM: CHEST - 2 VIEW COMPARISON:  Chest x-ray dated Dec 02, 2022 FINDINGS: Unchanged cardiomegaly. New consolidation of the anterior right lower lobe. Left lower lobe consolidation, also seen on prior exam. New mild diffuse interstitial opacities. No evidence of pneumothorax. No pleural effusion. IMPRESSION: 1. New consolidation of the anterior right lower lobe, concerning for infection or aspiration. 2. Left lower lobe consolidation. Further evaluation with chest CT recommended given lack of resolution when compared with the prior. 3. New mild diffuse interstitial opacities which could be due to a component of pulmonary edema. 4. Cardiomegaly. Electronically Signed   By: Allegra Lai M.D.   On: 05/25/2023 15:18    Subjective: No complaints  Discharge Exam: Vitals:   06/10/23 0358 06/10/23 0800  BP: 112/77 118/88  Pulse: 87 90  Resp: 18 18  Temp: 97.9 F (36.6 C) 97.6 F (36.4 C)  SpO2: 100% 100%   Vitals:   06/09/23 1615 06/10/23  0005 06/10/23 0358 06/10/23 0800  BP: 105/79 105/71 112/77 118/88  Pulse: 92 86 87 90  Resp: 17 18 18 18   Temp: 98.3 F (36.8 C)  97.9 F (36.6 C) 97.6 F (36.4 C)  TempSrc: Oral  Oral Oral  SpO2: 100% 97% 100% 100%  Weight:      Height:        General: Pt is alert, awake, not in acute distress Cardiovascular: RRR, S1/S2 +, no rubs, no gallops Respiratory: CTA bilaterally, no wheezing, no rhonchi Abdominal: Soft, NT, ND, bowel sounds + Extremities: no edema, no cyanosis    The results of significant diagnostics from this hospitalization (including imaging, microbiology, ancillary and laboratory) are listed below for reference.     Microbiology: Recent Results (from the past 240 hour(s))  Culture, blood (Routine X 2) w Reflex to ID Panel     Status: None   Collection Time: 06/04/23 10:59 AM   Specimen: BLOOD LEFT ARM  Result Value Ref Range Status   Specimen Description BLOOD  LEFT ARM  Final   Special Requests   Final    BOTTLES DRAWN AEROBIC AND ANAEROBIC Blood Culture results may not be optimal due to an inadequate volume of blood received in culture bottles   Culture   Final    NO GROWTH 5 DAYS Performed at [facility], [address], [place], [place] [address]    Report Status 06/09/2023 FINAL  Final  Culture, blood (Routine X 2) w Reflex to ID Panel     Status: None   Collection Time: 06/04/23 10:59 AM   Specimen: BLOOD  Result Value Ref Range Status   Specimen Description BLOOD SITE NOT SPECIFIED  Final   Special Requests   Final    BOTTLES DRAWN AEROBIC AND ANAEROBIC Blood Culture adequate volume   Culture   Final    NO GROWTH 5 DAYS Performed at [facility], [address], [place], [place] [address]    Report Status 06/09/2023 FINAL  Final     Labs: BNP (last 3 results) Recent Labs    03/07/23 1202 05/25/23 1439 06/04/23 0135  BNP 2,990.6* 1,346.6* 1,188.2*   Basic Metabolic Panel: Recent Labs  Lab 06/04/23 0133 06/05/23 0456 06/06/23 0923 06/07/23 0515 06/08/23 0505  NA 136 133* 129* 132* 134*  K 4.2 4.5 4.0 4.2 4.4  CL 104 105 100 103 104  CO2 21* 21* 21* 19* 22  GLUCOSE 115* 125* 91 101* 101*  BUN 13 15 17 17 15   CREATININE 1.22 1.30* 1.52* 1.23 1.23  CALCIUM 8.8* 8.5* 8.5* 8.2* 8.4*   Liver Function Tests: No results for input(s): "AST", "ALT", "ALKPHOS", "BILITOT", "PROT", "ALBUMIN" in the last 168 hours. No results for input(s): "LIPASE", "AMYLASE" in the last 168 hours. No results for input(s): "AMMONIA" in the last 168 hours. CBC: Recent Labs  Lab 06/04/23 0133 06/05/23 0456 06/06/23 0923 06/07/23 0515 06/08/23 0505  WBC 18.8* 16.9* 15.6* 14.2* 10.6*  NEUTROABS  --   --  13.2* 11.6* 8.4*  HGB 10.9* 9.1* 8.7* 8.7* 8.6*  HCT 34.9* 28.7* 27.7* 26.4* 27.1*  MCV 95.4 93.8 92.0 91.7 90.9  PLT 606* 552* 587* 518* 600*   Cardiac Enzymes: No results for input(s): "CKTOTAL", "CKMB", "CKMBINDEX",  "TROPONINI" in the last 168 hours. BNP: Invalid input(s): "POCBNP" CBG: No results for input(s): "GLUCAP" in the last 168 hours. D-Dimer No results for input(s): "DDIMER" in the last 72 hours. Hgb A1c No results for input(s): "HGBA1C"  in the last 72 hours. Lipid Profile No results for input(s): "CHOL", "HDL", "LDLCALC", "TRIG", "CHOLHDL", "LDLDIRECT" in the last 72 hours. Thyroid function studies No results for input(s): "TSH", "T4TOTAL", "T3FREE", "THYROIDAB" in the last 72 hours.  Invalid input(s): "FREET3" Anemia work up No results for input(s): "VITAMINB12", "FOLATE", "FERRITIN", "TIBC", "IRON", "RETICCTPCT" in the last 72 hours. Urinalysis    Component Value Date/Time   COLORURINE YELLOW 05/29/2023 1059   APPEARANCEUR CLEAR 05/29/2023 1059   LABSPEC 1.030 05/29/2023 1059   PHURINE 6.0 05/29/2023 1059   GLUCOSEU >=500 (A) 05/29/2023 1059   HGBUR NEGATIVE 05/29/2023 1059   BILIRUBINUR NEGATIVE 05/29/2023 1059   KETONESUR NEGATIVE 05/29/2023 1059   PROTEINUR 30 (A) 05/29/2023 1059   UROBILINOGEN 0.2 05/14/2015 0845   NITRITE NEGATIVE 05/29/2023 1059   LEUKOCYTESUR NEGATIVE 05/29/2023 1059   Sepsis Labs Recent Labs  Lab 06/05/23 0456 06/06/23 0923 06/07/23 0515 06/08/23 0505  WBC 16.9* 15.6* 14.2* 10.6*   Microbiology Recent Results (from the past 240 hour(s))  Culture, blood (Routine X 2) w Reflex to ID Panel     Status: None   Collection Time: 06/04/23 10:59 AM   Specimen: BLOOD LEFT ARM  Result Value Ref Range Status   Specimen Description BLOOD LEFT ARM  Final   Special Requests   Final    BOTTLES DRAWN AEROBIC AND ANAEROBIC Blood Culture results may not be optimal due to an inadequate volume of blood received in culture bottles   Culture   Final    NO GROWTH 5 DAYS Performed at [facility], [address], [place], [place] [address]    Report Status 06/09/2023 FINAL  Final  Culture, blood (Routine X 2) w Reflex to ID Panel     Status: None    Collection Time: 06/04/23 10:59 AM   Specimen: BLOOD  Result Value Ref Range Status   Specimen Description BLOOD SITE NOT SPECIFIED  Final   Special Requests   Final    BOTTLES DRAWN AEROBIC AND ANAEROBIC Blood Culture adequate volume   Culture   Final    NO GROWTH 5 DAYS Performed at [facility], [address], [place], [place] [address]    Report Status 06/09/2023 FINAL  Final     Time coordinating discharge: Over 35 minutes  SIGNED:   Marinda Elk, MD  [facility] 06/10/2023, 10:30 AM Pager   If 7PM-7AM, please contact night-coverage [email or web address] Password [email or web address]

## 2023-06-10 NOTE — Plan of Care (Signed)
  Problem: Education: Goal: Knowledge of General Education information will improve Description Including pain rating scale, medication(s)/side effects and non-pharmacologic comfort measures Outcome: Progressing   

## 2023-06-10 NOTE — Progress Notes (Addendum)
Mobility Specialist Progress Note:   06/10/23 1219  Mobility  Activity Stood at bedside, Ambulated in room with assistance  Level of Assistance Modified independent, requires aide device or extra time  Assistive Device Other (Comment) (Bedside table)  Activity Response Tolerated well  $Mobility charge 1 Mobility  Mobility Specialist Start Time (ACUTE ONLY) 1215  Mobility Specialist Stop Time (ACUTE ONLY) 1219  Mobility Specialist Time Calculation (min) (ACUTE ONLY) 4 min   Pt declined ambulation in hallway d/t upcoming d/c. Received standing at bedside using ModI with bedside table for support. Pt and s/o gathering belongings. Tolerated well, asx throughout. Left pt sitting EOB, all needs met.   Feliciana Rossetti Mobility Specialist Please contact via Special educational needs teacher or  Rehab office at [phone]

## 2023-06-10 NOTE — ED Provider Notes (Signed)
[place] EMERGENCY DEPARTMENT AT [facility] Provider Note   CSN: [id number] Arrival date & time: 06/10/23  2206     History {Add pertinent medical, surgical, social history, OB history to HPI:1} Chief Complaint  Patient presents with   Shortness of Breath    Craig Schmidt is a 52 y.o. male.   Shortness of Breath   52 year old male presents emergency department with complaints of shortness of breath, chest pain.  Patient recently admitted on 06/04/2023 with diagnosis of pneumonia and acute respiratory failure with hypoxia.  Patient recently discharged this morning.  Patient states that he was outside in the cold and began to feel short of breath as well as experiencing right-sided chest pain.  Denies any fever, chills, abdominal pain, nausea, vomiting.  Does state that he still has been coughing.  Denies any substance use since he has been discharged.  Past medical history significant for CHF, CAD, CKD 3, cocaine abuse, CVA, STEMI, alcohol abuse, homelessness, CAD  Home Medications Prior to Admission medications   Medication Sig Start Date End Date Taking? Authorizing Provider  apixaban (ELIQUIS) 5 MG TABS tablet Take 1 tablet (5 mg total) by mouth 2 (two) times daily. 02/05/23   [place], Anderson Malta, [name]  atorvastatin (LIPITOR) 40 MG tablet Take 1 tablet (40 mg total) by mouth daily. 06/10/23   Marinda Elk, MD  carvedilol (COREG) 3.125 MG tablet Take 1 tablet (3.125 mg total) by mouth 2 (two) times daily. 06/10/23   Marinda Elk, MD  clopidogrel (PLAVIX) 75 MG tablet Take 1 tablet (75 mg total) by mouth daily. 06/10/23   Marinda Elk, MD  furosemide (LASIX) 40 MG tablet Take 2 tablets (80 mg total) by mouth daily. 06/10/23 09/08/23  Marinda Elk, MD      Allergies    Shellfish allergy and Peanut (diagnostic)    Review of Systems   Review of Systems  Respiratory:  Positive for shortness of breath.   All other systems reviewed and  are negative.   Physical Exam Updated Vital Signs BP (!) 150/113 (BP Location: Left Arm)   Temp 97.9 F (36.6 C) (Oral)   Resp (!) 22   SpO2 96%  Physical Exam Vitals and nursing note reviewed.  Constitutional:      General: He is not in acute distress.    Appearance: He is well-developed.  HENT:     Head: Normocephalic and atraumatic.  Eyes:     Conjunctiva/sclera: Conjunctivae normal.  Cardiovascular:     Rate and Rhythm: Normal rate and regular rhythm.     Heart sounds: No murmur heard. Pulmonary:     Effort: Pulmonary effort is normal. No respiratory distress.     Comments: Faint wheeze auscultated bilateral lung fields. Abdominal:     Palpations: Abdomen is soft.     Tenderness: There is no abdominal tenderness.  Musculoskeletal:        General: No swelling.     Cervical back: Neck supple.     Right lower leg: No edema.     Left lower leg: No edema.  Skin:    General: Skin is warm and dry.     Capillary Refill: Capillary refill takes less than 2 seconds.  Neurological:     Mental Status: He is alert.  Psychiatric:        Mood and Affect: Mood normal.     ED Results / Procedures / Treatments   Labs (all labs ordered are listed, but only  abnormal results are displayed) Labs Reviewed  CBC WITH DIFFERENTIAL/PLATELET  COMPREHENSIVE METABOLIC PANEL  BRAIN NATRIURETIC PEPTIDE  TROPONIN I (HIGH SENSITIVITY)    EKG None  Radiology No results found.  Procedures Procedures  {Document cardiac monitor, telemetry assessment procedure when appropriate:1}  Medications Ordered in ED Medications - No data to display  ED Course/ Medical Decision Making/ A&P   {   Click here for ABCD2, HEART and other calculatorsREFRESH Note before signing :1}                              Medical Decision Making Amount and/or Complexity of Data Reviewed Labs: ordered. Radiology: ordered.   This patient presents to the ED for concern of shortness of breath, chest pain,  this involves an extensive number of treatment options, and is a complaint that carries with it a high risk of complications and morbidity.  The differential diagnosis includes ACS, PE, pneumonia, musculoskeletal, pneumothorax, aortic dissection, GERD, other   Co morbidities that complicate the patient evaluation  See HPI   Additional history obtained:  Additional history obtained from EMR External records from outside source obtained and reviewed including hospital records   Lab Tests:  I Ordered, and personally interpreted labs.  The pertinent results include:  ***   Imaging Studies ordered:  I ordered imaging studies including chest x-ray I independently visualized and interpreted imaging which showed *** I agree with the radiologist interpretation   Cardiac Monitoring: / EKG:  The patient was maintained on a cardiac monitor.  I personally viewed and interpreted the cardiac monitored which showed an underlying rhythm of: ***   Consultations Obtained:  N/a   Problem List / ED Course / Critical interventions / Medication management  *** I ordered medication including ***  for ***  Reevaluation of the patient after these medicines showed that the patient {resolved/improved/worsened:23923::"improved"} I have reviewed the patients home medicines and have made adjustments as needed   Social Determinants of Health:  ***   Test / Admission - Considered:  Vitals signs significant for ***. Otherwise within normal range and stable throughout visit. Laboratory/imaging studies significant for: *** *** Worrisome signs and symptoms were discussed with the patient, and the patient acknowledged understanding to return to the ED if noticed. Patient was stable upon discharge.    {Document critical care time when appropriate:1} {Document review of labs and clinical decision tools ie heart score, Chads2Vasc2 etc:1}  {Document your independent review of radiology images, and  any outside records:1} {Document your discussion with family members, caretakers, and with consultants:1} {Document social determinants of health affecting pt's care:1} {Document your decision making why or why not admission, treatments were needed:1} Final Clinical Impression(s) / ED Diagnoses Final diagnoses:  None    Rx / DC Orders ED Discharge Orders     None

## 2023-06-10 NOTE — Progress Notes (Signed)
PT Cancellation Note  Patient Details Name: Craig Schmidt MRN: [id number] DOB: August 25, 1970   Cancelled Treatment:    Reason Eval/Treat Not Completed: Patient declined, no reason specified. PT attempted to see pt twice for treatment however the pt refuses both times. Pt initially declines due to wanting to eat breakfast, then later reports he is not doing anything today. PT will follow up as time allows and as the pt is willing to participate.   Arlyss Gandy 06/10/2023, 11:40 AM

## 2023-06-10 NOTE — ED Triage Notes (Signed)
Pt reports dx with pneumonia, admitted 11/12 , dc today , pt reports he can not take a deep breath w/o pain , pt sating 90 for EMS on RA, pt sating 96 on 2L. RR shallow

## 2023-06-10 NOTE — Plan of Care (Signed)
?  Problem: Clinical Measurements: ?Goal: Ability to maintain clinical measurements within normal limits will improve ?Outcome: Progressing ?Goal: Will remain free from infection ?Outcome: Progressing ?Goal: Diagnostic test results will improve ?Outcome: Progressing ?  ?Problem: Clinical Measurements: ?Goal: Will remain free from infection ?Outcome: Progressing ?  ?Problem: Clinical Measurements: ?Goal: Diagnostic test results will improve ?Outcome: Progressing ?  ?

## 2023-06-11 ENCOUNTER — Encounter (HOSPITAL_COMMUNITY): Payer: Self-pay | Admitting: Internal Medicine

## 2023-06-11 DIAGNOSIS — E875 Hyperkalemia: Secondary | ICD-10-CM | POA: Diagnosis present

## 2023-06-11 DIAGNOSIS — E871 Hypo-osmolality and hyponatremia: Secondary | ICD-10-CM | POA: Diagnosis present

## 2023-06-11 DIAGNOSIS — T502X5A Adverse effect of carbonic-anhydrase inhibitors, benzothiadiazides and other diuretics, initial encounter: Secondary | ICD-10-CM | POA: Diagnosis present

## 2023-06-11 DIAGNOSIS — Z1152 Encounter for screening for COVID-19: Secondary | ICD-10-CM | POA: Diagnosis not present

## 2023-06-11 DIAGNOSIS — E43 Unspecified severe protein-calorie malnutrition: Secondary | ICD-10-CM | POA: Diagnosis present

## 2023-06-11 DIAGNOSIS — I252 Old myocardial infarction: Secondary | ICD-10-CM | POA: Diagnosis not present

## 2023-06-11 DIAGNOSIS — Z7902 Long term (current) use of antithrombotics/antiplatelets: Secondary | ICD-10-CM | POA: Diagnosis not present

## 2023-06-11 DIAGNOSIS — D75839 Thrombocytosis, unspecified: Secondary | ICD-10-CM | POA: Diagnosis present

## 2023-06-11 DIAGNOSIS — I2510 Atherosclerotic heart disease of native coronary artery without angina pectoris: Secondary | ICD-10-CM | POA: Diagnosis present

## 2023-06-11 DIAGNOSIS — F17210 Nicotine dependence, cigarettes, uncomplicated: Secondary | ICD-10-CM | POA: Diagnosis present

## 2023-06-11 DIAGNOSIS — J189 Pneumonia, unspecified organism: Secondary | ICD-10-CM | POA: Diagnosis present

## 2023-06-11 DIAGNOSIS — D631 Anemia in chronic kidney disease: Secondary | ICD-10-CM | POA: Diagnosis present

## 2023-06-11 DIAGNOSIS — M549 Dorsalgia, unspecified: Secondary | ICD-10-CM | POA: Diagnosis present

## 2023-06-11 DIAGNOSIS — Z5900 Homelessness unspecified: Secondary | ICD-10-CM | POA: Diagnosis not present

## 2023-06-11 DIAGNOSIS — D649 Anemia, unspecified: Secondary | ICD-10-CM | POA: Diagnosis present

## 2023-06-11 DIAGNOSIS — E785 Hyperlipidemia, unspecified: Secondary | ICD-10-CM | POA: Diagnosis present

## 2023-06-11 DIAGNOSIS — J45909 Unspecified asthma, uncomplicated: Secondary | ICD-10-CM | POA: Diagnosis present

## 2023-06-11 DIAGNOSIS — I429 Cardiomyopathy, unspecified: Secondary | ICD-10-CM | POA: Diagnosis present

## 2023-06-11 DIAGNOSIS — Z7901 Long term (current) use of anticoagulants: Secondary | ICD-10-CM | POA: Diagnosis not present

## 2023-06-11 DIAGNOSIS — F1010 Alcohol abuse, uncomplicated: Secondary | ICD-10-CM | POA: Diagnosis present

## 2023-06-11 DIAGNOSIS — I130 Hypertensive heart and chronic kidney disease with heart failure and stage 1 through stage 4 chronic kidney disease, or unspecified chronic kidney disease: Secondary | ICD-10-CM | POA: Diagnosis present

## 2023-06-11 DIAGNOSIS — I5023 Acute on chronic systolic (congestive) heart failure: Secondary | ICD-10-CM | POA: Diagnosis not present

## 2023-06-11 DIAGNOSIS — F1410 Cocaine abuse, uncomplicated: Secondary | ICD-10-CM | POA: Diagnosis present

## 2023-06-11 DIAGNOSIS — N182 Chronic kidney disease, stage 2 (mild): Secondary | ICD-10-CM | POA: Diagnosis present

## 2023-06-11 DIAGNOSIS — R7989 Other specified abnormal findings of blood chemistry: Secondary | ICD-10-CM | POA: Diagnosis present

## 2023-06-11 DIAGNOSIS — Z79899 Other long term (current) drug therapy: Secondary | ICD-10-CM | POA: Diagnosis not present

## 2023-06-11 LAB — COMPREHENSIVE METABOLIC PANEL
ALT: 16 U/L (ref 0–44)
ALT: 13 U/L (ref 0–44)
AST: 41 U/L (ref 15–41)
AST: 25 U/L (ref 15–41)
Albumin: 2.6 g/dL — ABNORMAL LOW (ref 3.5–5.0)
Albumin: 2.1 g/dL — ABNORMAL LOW (ref 3.5–5.0)
Alkaline Phosphatase: 134 U/L — ABNORMAL HIGH (ref 38–126)
Alkaline Phosphatase: 112 U/L (ref 38–126)
Anion gap: 9 (ref 5–15)
Anion gap: 10 (ref 5–15)
BUN: 14 mg/dL (ref 6–20)
BUN: 17 mg/dL (ref 6–20)
CO2: 24 mmol/L (ref 22–32)
CO2: 25 mmol/L (ref 22–32)
Calcium: 9 mg/dL (ref 8.9–10.3)
Calcium: 8.5 mg/dL — ABNORMAL LOW (ref 8.9–10.3)
Chloride: 100 mmol/L (ref 98–111)
Chloride: 100 mmol/L (ref 98–111)
Creatinine, Ser: 1.16 mg/dL (ref 0.61–1.24)
Creatinine, Ser: 1.27 mg/dL — ABNORMAL HIGH (ref 0.61–1.24)
GFR, Estimated: >60 mL/min (ref >60)
GFR, Estimated: >60 mL/min (ref >60)
Glucose, Bld: 87 mg/dL (ref 70–99)
Glucose, Bld: 116 mg/dL — ABNORMAL HIGH (ref 70–99)
Potassium: 5.2 mmol/L — ABNORMAL HIGH (ref 3.5–5.1)
Potassium: 4.6 mmol/L (ref 3.5–5.1)
Sodium: 133 mmol/L — ABNORMAL LOW (ref 135–145)
Sodium: 135 mmol/L (ref 135–145)
Total Bilirubin: 0.9 mg/dL (ref <1.2)
Total Bilirubin: 0.5 mg/dL (ref <1.2)
Total Protein: 8.1 g/dL (ref 6.5–8.1)
Total Protein: 6.7 g/dL (ref 6.5–8.1)

## 2023-06-11 LAB — CBC WITH DIFFERENTIAL/PLATELET
Abs Immature Granulocytes: 0.06 10*3/uL (ref 0.00–0.07)
Basophils Absolute: 0 10*3/uL (ref 0.0–0.1)
Basophils Relative: 0 %
Eosinophils Absolute: 0.4 10*3/uL (ref 0.0–0.5)
Eosinophils Relative: 3 %
HCT: 30 % — ABNORMAL LOW (ref 39.0–52.0)
Hemoglobin: 9.6 g/dL — ABNORMAL LOW (ref 13.0–17.0)
Immature Granulocytes: 1 %
Lymphocytes Relative: 12 %
Lymphs Abs: 1.5 10*3/uL (ref 0.7–4.0)
MCH: 29.6 pg (ref 26.0–34.0)
MCHC: 32 g/dL (ref 30.0–36.0)
MCV: 92.6 fL (ref 80.0–100.0)
Monocytes Absolute: 0.9 10*3/uL (ref 0.1–1.0)
Monocytes Relative: 7 %
Neutro Abs: 10 10*3/uL — ABNORMAL HIGH (ref 1.7–7.7)
Neutrophils Relative %: 77 %
Platelets: 646 10*3/uL — ABNORMAL HIGH (ref 150–400)
RBC: 3.24 MIL/uL — ABNORMAL LOW (ref 4.22–5.81)
RDW: 14.3 % (ref 11.5–15.5)
WBC: 12.8 10*3/uL — ABNORMAL HIGH (ref 4.0–10.5)
nRBC: 0 % (ref 0.0–0.2)

## 2023-06-11 LAB — TROPONIN I (HIGH SENSITIVITY)
Troponin I (High Sensitivity): 33 ng/L — ABNORMAL HIGH (ref <18)
Troponin I (High Sensitivity): 29 ng/L — ABNORMAL HIGH (ref <18)

## 2023-06-11 LAB — PHOSPHORUS: Phosphorus: 3.5 mg/dL (ref 2.5–4.6)

## 2023-06-11 LAB — MAGNESIUM: Magnesium: 2 mg/dL (ref 1.7–2.4)

## 2023-06-11 LAB — SARS CORONAVIRUS 2 BY RT PCR: SARS Coronavirus 2 by RT PCR: NEGATIVE

## 2023-06-11 LAB — BRAIN NATRIURETIC PEPTIDE: B Natriuretic Peptide: 1133.5 pg/mL — ABNORMAL HIGH (ref 0.0–100.0)

## 2023-06-11 MED ORDER — FENTANYL CITRATE PF 50 MCG/ML IJ SOSY
25.0000 ug | PREFILLED_SYRINGE | INTRAMUSCULAR | Status: DC | PRN
  Administered 2023-06-11: 25 ug via INTRAVENOUS
  Filled 2023-06-11: qty 1

## 2023-06-11 MED ORDER — ACETAMINOPHEN 650 MG RE SUPP: 650.0000 mg | Freq: Four times a day (QID) | RECTAL | Status: DC | PRN

## 2023-06-11 MED ORDER — NICOTINE 14 MG/24HR TD PT24: 14.0000 mg | MEDICATED_PATCH | Freq: Every day | TRANSDERMAL | Status: DC | PRN

## 2023-06-11 MED ORDER — APIXABAN 5 MG PO TABS
5.0000 mg | ORAL_TABLET | Freq: Two times a day (BID) | ORAL | Status: DC
Start: 2023-06-11 — End: 2023-06-14
  Administered 2023-06-11 – 2023-06-13 (×5): 5 mg via ORAL
  Filled 2023-06-11 (×5): qty 1

## 2023-06-11 MED ORDER — KETOROLAC TROMETHAMINE 15 MG/ML IJ SOLN
15.0000 mg | Freq: Once | INTRAMUSCULAR | Status: AC
  Administered 2023-06-11: 15 mg via INTRAVENOUS
  Filled 2023-06-11: qty 1

## 2023-06-11 MED ORDER — FUROSEMIDE 10 MG/ML IJ SOLN
40.0000 mg | Freq: Once | INTRAMUSCULAR | Status: AC
  Administered 2023-06-11: 40 mg via INTRAVENOUS
  Filled 2023-06-11: qty 4

## 2023-06-11 MED ORDER — FUROSEMIDE 40 MG PO TABS
80.0000 mg | ORAL_TABLET | Freq: Every day | ORAL | Status: DC
  Administered 2023-06-11 – 2023-06-12 (×2): 80 mg via ORAL
  Filled 2023-06-11 (×2): qty 2

## 2023-06-11 MED ORDER — ACETAMINOPHEN 325 MG PO TABS: 650.0000 mg | ORAL_TABLET | Freq: Four times a day (QID) | ORAL | Status: DC | PRN

## 2023-06-11 MED ORDER — VANCOMYCIN HCL 1500 MG/300ML IV SOLN
1500.0000 mg | Freq: Once | INTRAVENOUS | Status: AC
  Administered 2023-06-11: 1500 mg via INTRAVENOUS
  Filled 2023-06-11: qty 300

## 2023-06-11 MED ORDER — NALOXONE HCL 0.4 MG/ML IJ SOLN: 0.4000 mg | INTRAMUSCULAR | Status: DC | PRN

## 2023-06-11 MED ORDER — IPRATROPIUM-ALBUTEROL 0.5-2.5 (3) MG/3ML IN SOLN
3.0000 mL | Freq: Four times a day (QID) | RESPIRATORY_TRACT | Status: DC
  Administered 2023-06-11 – 2023-06-12 (×5): 3 mL via RESPIRATORY_TRACT
  Filled 2023-06-11 (×6): qty 3

## 2023-06-11 MED ORDER — SODIUM CHLORIDE 0.9 % IV SOLN
2.0000 g | Freq: Three times a day (TID) | INTRAVENOUS | Status: DC
  Administered 2023-06-11 – 2023-06-12 (×5): 2 g via INTRAVENOUS
  Filled 2023-06-11 (×5): qty 12.5

## 2023-06-11 MED ORDER — MELATONIN 3 MG PO TABS
3.0000 mg | ORAL_TABLET | Freq: Every evening | ORAL | Status: DC | PRN
  Administered 2023-06-12: 3 mg via ORAL
  Filled 2023-06-11: qty 1

## 2023-06-11 MED ORDER — CARVEDILOL 3.125 MG PO TABS
3.1250 mg | ORAL_TABLET | Freq: Two times a day (BID) | ORAL | Status: DC
  Administered 2023-06-11 – 2023-06-13 (×5): 3.125 mg via ORAL
  Filled 2023-06-11 (×5): qty 1

## 2023-06-11 MED ORDER — ONDANSETRON HCL 4 MG/2ML IJ SOLN: 4.0000 mg | Freq: Four times a day (QID) | INTRAMUSCULAR | Status: DC | PRN

## 2023-06-11 MED ORDER — CLOPIDOGREL BISULFATE 75 MG PO TABS
75.0000 mg | ORAL_TABLET | Freq: Every day | ORAL | Status: DC
  Administered 2023-06-11 – 2023-06-13 (×3): 75 mg via ORAL
  Filled 2023-06-11 (×3): qty 1

## 2023-06-11 MED ORDER — METHYLPREDNISOLONE SODIUM SUCC 40 MG IJ SOLR
40.0000 mg | Freq: Once | INTRAMUSCULAR | Status: AC
  Administered 2023-06-11: 40 mg via INTRAVENOUS
  Filled 2023-06-11: qty 1

## 2023-06-11 MED ORDER — ATORVASTATIN CALCIUM 40 MG PO TABS
40.0000 mg | ORAL_TABLET | Freq: Every day | ORAL | Status: DC
  Administered 2023-06-11 – 2023-06-13 (×3): 40 mg via ORAL
  Filled 2023-06-11 (×3): qty 1

## 2023-06-11 MED ORDER — VANCOMYCIN HCL 750 MG/150ML IV SOLN
750.0000 mg | Freq: Two times a day (BID) | INTRAVENOUS | Status: DC
  Administered 2023-06-11 – 2023-06-12 (×2): 750 mg via INTRAVENOUS
  Filled 2023-06-11 (×4): qty 150

## 2023-06-11 MED ORDER — TRAMADOL HCL 50 MG PO TABS
50.0000 mg | ORAL_TABLET | Freq: Four times a day (QID) | ORAL | Status: DC | PRN
  Administered 2023-06-11 – 2023-06-13 (×3): 50 mg via ORAL
  Filled 2023-06-11 (×3): qty 1

## 2023-06-11 NOTE — Progress Notes (Signed)
Pharmacy Antibiotic Note  Craig Schmidt is a 52 y.o. male admitted on 06/10/2023 with pneumonia.  Recent hospitalization 11/12-11/18/2024 for pneumonia (treated with Vanc/Cefepime => amoxicillin/doxycycline; completed treatment course prior to discharge). Returns to ED 11/18pm with c/o SOB and chest pain.  Chest Xray = multifocal pneumonia.  Pharmacy has been consulted for Vancomycin and Cefepime dosing.  Plan: Vancomycin 1500mg  IV x 1 followed by Vancomycin 750 mg IV Q 12 hrs. Goal AUC 400-550.  Expected AUC: 460  SCr used: 1.16 Cefepime 2gm IV q8h Follow renal function     Temp (24hrs), Avg:97.8 F (36.6 C), Min:97.6 F (36.4 C), Max:97.9 F (36.6 C)  Recent Labs  Lab 06/04/23 0335 06/04/23 1059 06/05/23 0456 06/06/23 0923 06/07/23 0515 06/08/23 0505 06/10/23 2321  WBC  --   --  16.9* 15.6* 14.2* 10.6* 12.4*  CREATININE  --   --  1.30* 1.52* 1.23 1.23 1.16  LATICACIDVEN 1.5 1.0  --   --   --   --   --     Estimated Creatinine Clearance: 71.2 mL/min (by C-G formula based on SCr of 1.16 mg/dL).    Allergies  Allergen Reactions   Shellfish Allergy Anaphylaxis   Peanut (Diagnostic) Itching    Antimicrobials this admission: 11/19 Cefepime >>   11/19 Vancomycin >>    Dose adjustments this admission:    Microbiology results:    Thank you for allowing pharmacy to be a part of this patient's care.  Maryellen Pile, PharmD 06/11/2023 2:42 AM

## 2023-06-11 NOTE — ED Notes (Signed)
Discussed with hospitalist that pt has been on antibiotics for several days at this time for pneumonia, and agreement reached that blood cultures not indicated at this time. VOR entered to dc blood cultures.

## 2023-06-11 NOTE — H&P (Signed)
History and Physical    Patient: Craig Schmidt ZOX:[id number] DOB: [birth year] DOA: 06/10/2023 DOS: the patient was seen and examined on 06/11/2023 PCP: Rema Fendt, NP  Patient coming from: Home  Chief Complaint:  Chief Complaint  Patient presents with   Shortness of Breath   HPI: Craig Schmidt is a 52 y.o. male with medical history significant of homelessness for 30 years, cocaine abuse, alcohol abuse, noncompliance with medical treatment, CAD, history of STEMI, cardiomyopathy, chronic systolic CHF, hypertension, history of nonhemorrhagic CVA, hyperlipidemia, chronic kidney disease who has been hospitalized for multifocal pneumonia twice leaving AMA on 05/31/2023 after 6 days of treatment, readmitted on 06/04/2023 and discharged yesterday, but later returned to the emergency department stating that he was not able to take a deep breath without pleuritic pain.  O2 sat was 90% on room air and then 96% on 2 LPM.  No fever, rhinorrhea, sore throat or hemoptysis.  No palpitations, diaphoresis, PND, orthopnea or pitting edema of the lower extremities.  No abdominal pain, nausea, emesis, diarrhea, constipation, melena or hematochezia.  No flank pain, dysuria, frequency or hematuria.  No polyuria, polydipsia, polyphagia or blurred vision. The patient was insisting on getting corset, but I told him that we will treat the inflammation first as the pain is coming from pleural irritation.   Lab work: CBC showed white count 12.4, hemoglobin 11.5 g deciliter platelets 540.  Troponin was 33 then 29 ng/L.  Coronavirus PCR was negative.  CMP showed a sodium 133, potassium 5.2 mmol/L.  Albumin was 2.6 g/dL and alkaline phosphatase 134 units/L.  The rest of the CMP measurements were normal.  BMP was 1133.5 pg/mL.  Imaging: Portable 1 view chest radiograph showing low lung volumes with mild/moderate severity bibasilar infiltrates, left greater than right.  There is mild cardiomegaly.  There is patchy  airspace disease in the mid to lower lung fields, compatible with known multifocal pneumonia.  ED course: Initial vital signs were temperature 97.9 F, pulse 95, respiration 20, BP 150/113 mmHg O2 sat 96% on nasal cannula oxygen at 2 LPM.  He received furosemide 40 mg IVP, restarted on vancomycin and cefepime.  I added Toradol 15 mg IVP x 1 and methylprednisolone 40 mg p.o. x 1.   Review of Systems: As mentioned in the history of present illness. All other systems reviewed and are negative. Past Medical History:  Diagnosis Date   Alcohol abuse    Asthma    Brain tumor (HCC)    CAD (coronary artery disease)    Chronic HFrEF (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)    Chronic kidney disease, stage 3 (HCC)    Cocaine abuse (HCC)    History of medication noncompliance    Homelessness    MI (myocardial infarction) (HCC)    STEMI (ST elevation myocardial infarction) (HCC)    Stroke [facility])    Past Surgical History:  Procedure Laterality Date   BUBBLE STUDY  09/11/2021   Procedure: BUBBLE STUDY;  Surgeon: Pricilla Riffle, MD;  Location: [facility] ENDOSCOPY;  Service: Cardiovascular;;   LEFT HEART CATH AND CORONARY ANGIOGRAPHY N/A 12/19/2021   Procedure: LEFT HEART CATH AND CORONARY ANGIOGRAPHY;  Surgeon: Lyn Records, MD;  Location: [facility] INVASIVE CV LAB;  Service: Cardiovascular;  Laterality: N/A;   RIGHT/LEFT HEART CATH AND CORONARY ANGIOGRAPHY N/A 04/13/2022   Procedure: RIGHT/LEFT HEART CATH AND CORONARY ANGIOGRAPHY;  Surgeon: Laurey Morale, MD;  Location: [facility] INVASIVE CV LAB;  Service: Cardiovascular;  Laterality: N/A;  TEE WITHOUT CARDIOVERSION N/A 09/11/2021   Procedure: TRANSESOPHAGEAL ECHOCARDIOGRAM (TEE);  Surgeon: Pricilla Riffle, MD;  Location: [facility] ENDOSCOPY;  Service: Cardiovascular;  Laterality: N/A;   Social History:  reports that he has been smoking cigarettes. He has a 41 pack-year smoking history. He has been exposed to tobacco smoke. He has never used smokeless tobacco. He  reports current alcohol use of about 63.0 standard drinks of alcohol per week. He reports current drug use. Drugs: "Crack" cocaine and Cocaine.  Allergies  Allergen Reactions   Shellfish Allergy Anaphylaxis   Peanut (Diagnostic) Itching    Family History  Problem Relation Age of Onset   Diabetes Mother     Prior to Admission medications   Medication Sig Start Date End Date Taking? Authorizing Provider  atorvastatin (LIPITOR) 40 MG tablet Take 1 tablet (40 mg total) by mouth daily. 06/10/23  Yes Marinda Elk, MD  carvedilol (COREG) 3.125 MG tablet Take 1 tablet (3.125 mg total) by mouth 2 (two) times daily. 06/10/23  Yes Marinda Elk, MD  clopidogrel (PLAVIX) 75 MG tablet Take 1 tablet (75 mg total) by mouth daily. 06/10/23  Yes Marinda Elk, MD  furosemide (LASIX) 40 MG tablet Take 2 tablets (80 mg total) by mouth daily. 06/10/23 09/08/23 Yes Marinda Elk, MD  apixaban (ELIQUIS) 5 MG TABS tablet Take 1 tablet (5 mg total) by mouth 2 (two) times daily. 02/05/23   Jacklynn Ganong, [name]    Physical Exam: Vitals:   06/11/23 0200 06/11/23 0213 06/11/23 0509 06/11/23 0530  BP: (!) 145/95   128/85  Pulse: (!) 102   96  Resp: (!) 29   20  Temp:  97.7 F (36.5 C) 97.9 F (36.6 C)   TempSrc:      SpO2: 100%   98%   Physical Exam Vitals and nursing note reviewed.  Constitutional:      General: He is awake. He is not in acute distress.    Appearance: He is well-developed and normal weight. He is ill-appearing.     Interventions: Nasal cannula in place.  HENT:     Head: Normocephalic.     Nose: No rhinorrhea.     Mouth/Throat:     Mouth: Mucous membranes are moist.  Eyes:     General: No scleral icterus.    Pupils: Pupils are equal, round, and reactive to light.  Neck:     Vascular: No JVD.  Cardiovascular:     Rate and Rhythm: Normal rate and regular rhythm.     Heart sounds: S1 normal and S2 normal.  Pulmonary:     Breath sounds: Examination  of the right-lower field reveals rales. Examination of the left-lower field reveals rales. Wheezing and rales present. No rhonchi.  Abdominal:     General: Bowel sounds are normal. There is no distension.     Palpations: Abdomen is soft.     Tenderness: There is no abdominal tenderness.  Musculoskeletal:     Cervical back: Neck supple.     Right lower leg: No edema.     Left lower leg: No edema.  Skin:    General: Skin is warm and dry.  Neurological:     General: No focal deficit present.     Mental Status: He is alert and oriented to person, place, and time.  Psychiatric:        Mood and Affect: Mood normal.        Behavior: Behavior normal. Behavior is cooperative.  Data Reviewed:  Results are pending, will review when available. 05/27/2023 echo bubble study. IMPRESSIONS:   1. Left ventricular ejection fraction, by estimation, is <20%. The left  ventricle has severely decreased function. The left ventricle demonstrates  global hypokinesis. The left ventricular internal cavity size was  moderately dilated. Left ventricular  diastolic parameters are consistent with Grade II diastolic dysfunction  (pseudonormalization).   2. Right ventricular systolic function is at least moderately reduced.  The right ventricular size is mildly enlarged.   3. Left atrial size is moderate to severely dilated.   4. The mitral valve is thickened with limited movement of the valve  leaflets (cannot r/o rheumatic mitral valve stenosis). There is a mean  gradient of consistent with severe mitral stenosis.   5. Tricuspid valve regurgitation is moderate to severe.   6. The aortic valve is normal in structure. Aortic valve regurgitation is  not visualized.  EKG: Vent. rate 101 BPM PR interval 148 ms QRS duration 106 ms QT/QTcB 386/501 ms P-R-T axes 76 66 * Sinus tachycardia Low voltage with right axis deviation Nonspecific T abnormalities, lateral leadsrevious  Assessment and  Plan: Principal Problem:   Multifocal pneumonia Admit to PCU/inpatient. Supplemental oxygen as needed. Bronchodilators as needed. Continue cefepime 2 g every 8 hours.   Continue vancomycin per pharmacy. Follow-up blood culture and sensitivity Follow CBC and CMP in a.m.  Active Problems:   Acute on chronic combined systolic and diastolic heart failure (HCC)  Received furosemide 40 mg IV in the emergency department. Continue supplemental oxygen.   Sodium and fluid restriction. Continue furosemide 80 g p.o. daily. Monitor daily weights, intake and output. Continue carvedilol twice daily. Long-term prognosis is poor.   Essential hypertension Continue carvedilol 3.125 mg p.o. twice daily.    Troponin I above reference range Secondary to severe combined heart failure.    Hyperkalemia Resolved after furosemide IV in ED.    Hyponatremia Secondary to HF/furosemide use. Resolved this morning.    Normocytic anemia Monitor hematocrit and hemoglobin.    Thrombocytosis In the setting of anemia. Monitor platelet count.    Smoker Tobacco cessation advised. Nicotine replacement therapy declined.    Alcohol abuse she rounding so my patient please do not I changed. In remission according to the patient.    Dyslipidemia Continuing atorvastatin 40 mg daily.    Homeless Consult TOC team.    Protein-calorie malnutrition, severe In the setting of anemia and other chronic illness. May benefit from protein supplementation. Consider nutritional services evaluation. Follow-up albumin level.    Advance Care Planning:   Code Status: Full Code   Consults:   Family Communication:   Severity of Illness: The appropriate patient status for this patient is INPATIENT. Inpatient status is judged to be reasonable and necessary in order to provide the required intensity of service to ensure the patient's safety. The patient's presenting symptoms, physical exam findings, and initial  radiographic and laboratory data in the context of their chronic comorbidities is felt to place them at high risk for further clinical deterioration. Furthermore, it is not anticipated that the patient will be medically stable for discharge from the hospital within 2 midnights of admission.   * I certify that at the point of admission it is my clinical judgment that the patient will require inpatient hospital care spanning beyond 2 midnights from the point of admission due to high intensity of service, high risk for further deterioration and high frequency of surveillance required.*  Author: Bobette Mo,  MD 06/11/2023 8:01 AM  For on call review [email or web address].   This document was prepared using Dragon voice recognition software and may contain some unintended transcription errors.

## 2023-06-11 NOTE — Plan of Care (Signed)
  Problem: Health Behavior/Discharge Planning: Goal: Ability to manage health-related needs will improve Outcome: Progressing   Problem: Nutrition: Goal: Adequate nutrition will be maintained Outcome: Progressing   Problem: Coping: Goal: Level of anxiety will decrease Outcome: Progressing   

## 2023-06-11 NOTE — ED Notes (Signed)
ED TO INPATIENT HANDOFF REPORT  ED Nurse Name and Phone #: Cat  S Name/Age/Gender Craig Schmidt 52 y.o. male Room/Bed: WA24/WA24  Code Status   Code Status: Full Code  Home/SNF/Other Home Patient oriented to: self, place, time, and situation Is this baseline? Yes   Triage Complete: Triage complete  Chief Complaint Multifocal pneumonia [J18.9]  Triage Note Pt reports dx with pneumonia, admitted 11/12 , dc today , pt reports he can not take a deep breath w/o pain , pt sating 90 for EMS on RA, pt sating 96 on 2L. RR shallow   Allergies Allergies  Allergen Reactions   Shellfish Allergy Anaphylaxis   Peanut (Diagnostic) Itching    Level of Care/Admitting Diagnosis ED Disposition     ED Disposition  Admit   Condition  --   Comment  Hospital Area: [facility] [[id number]]  Level of Care: Progressive [102]  Admit to Progressive based on following criteria: MULTISYSTEM THREATS such as stable sepsis, metabolic/electrolyte imbalance with or without encephalopathy that is responding to early treatment.  May admit patient to [facility] or [facility] if equivalent level of care is available:: No  Covid Evaluation: Asymptomatic - no recent exposure (last 10 days) testing not required  Diagnosis: Multifocal pneumonia [[id number]]  Admitting Physician: Angie Fava [[id number]]  Attending Physician: Angie Fava [[id number]]  Certification:: I certify this patient will need inpatient services for at least 2 midnights  Expected Medical Readiness: 06/13/2023          B Medical/Surgery History Past Medical History:  Diagnosis Date   Alcohol abuse    Asthma    Brain tumor (HCC)    CAD (coronary artery disease)    Chronic HFrEF (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)    Chronic kidney disease, stage 3 (HCC)    Cocaine abuse (HCC)    History of medication noncompliance    Homelessness    MI (myocardial infarction) (HCC)    STEMI (ST  elevation myocardial infarction) (HCC)    Stroke [facility])    Past Surgical History:  Procedure Laterality Date   BUBBLE STUDY  09/11/2021   Procedure: BUBBLE STUDY;  Surgeon: Pricilla Riffle, MD;  Location: [facility] ENDOSCOPY;  Service: Cardiovascular;;   LEFT HEART CATH AND CORONARY ANGIOGRAPHY N/A 12/19/2021   Procedure: LEFT HEART CATH AND CORONARY ANGIOGRAPHY;  Surgeon: Lyn Records, MD;  Location: [facility] INVASIVE CV LAB;  Service: Cardiovascular;  Laterality: N/A;   RIGHT/LEFT HEART CATH AND CORONARY ANGIOGRAPHY N/A 04/13/2022   Procedure: RIGHT/LEFT HEART CATH AND CORONARY ANGIOGRAPHY;  Surgeon: Laurey Morale, MD;  Location: [facility] INVASIVE CV LAB;  Service: Cardiovascular;  Laterality: N/A;   TEE WITHOUT CARDIOVERSION N/A 09/11/2021   Procedure: TRANSESOPHAGEAL ECHOCARDIOGRAM (TEE);  Surgeon: Pricilla Riffle, MD;  Location: [facility] ENDOSCOPY;  Service: Cardiovascular;  Laterality: N/A;     A IV Location/Drains/Wounds Patient Lines/Drains/Airways Status     Active Line/Drains/Airways     Name Placement date Placement time Site Days   Peripheral IV 06/11/23 20 G 1" Left Antecubital 06/11/23  0140  Antecubital  less than 1   External Urinary Catheter 06/11/23  0237  --  less than 1            Intake/Output Last 24 hours  Intake/Output Summary (Last 24 hours) at 06/11/2023 1123 Last data filed at 06/11/2023 1038 Gross per 24 hour  Intake 499.4 ml  Output 900 ml  Net -400.6 ml    Labs/Imaging Results  for orders placed or performed during the hospital encounter of 06/10/23 (from the past 48 hour(s))  CBC with Differential     Status: Abnormal   Collection Time: 06/10/23 11:21 PM  Result Value Ref Range   WBC 12.4 (H) 4.0 - 10.5 K/uL   RBC 3.92 (L) 4.22 - 5.81 MIL/uL   Hemoglobin 11.5 (L) 13.0 - 17.0 g/dL   HCT 78.2 (L) 95.6 - 21.3 %   MCV 94.6 80.0 - 100.0 fL   MCH 29.3 26.0 - 34.0 pg   MCHC 31.0 30.0 - 36.0 g/dL   RDW 08.6 57.8 - 46.9 %   Platelets 540 (H) 150 - 400 K/uL    Comment:  REPEATED TO VERIFY   nRBC 0.0 0.0 - 0.2 %   Neutrophils Relative % 76 %   Neutro Abs 9.4 (H) 1.7 - 7.7 K/uL   Lymphocytes Relative 15 %   Lymphs Abs 1.9 0.7 - 4.0 K/uL   Monocytes Relative 5 %   Monocytes Absolute 0.6 0.1 - 1.0 K/uL   Eosinophils Relative 3 %   Eosinophils Absolute 0.4 0.0 - 0.5 K/uL   Basophils Relative 0 %   Basophils Absolute 0.1 0.0 - 0.1 K/uL   Immature Granulocytes 1 %   Abs Immature Granulocytes 0.09 (H) 0.00 - 0.07 K/uL    Comment: Performed at [facility], [address], [place], [place] [address]  Comprehensive metabolic panel     Status: Abnormal   Collection Time: 06/10/23 11:21 PM  Result Value Ref Range   Sodium 133 (L) 135 - 145 mmol/L   Potassium 5.2 (H) 3.5 - 5.1 mmol/L    Comment: HEMOLYSIS AT THIS LEVEL MAY AFFECT RESULT   Chloride 100 98 - 111 mmol/L   CO2 24 22 - 32 mmol/L   Glucose, Bld 87 70 - 99 mg/dL    Comment: Glucose reference range applies only to samples taken after fasting for at least 8 hours.   BUN 14 6 - 20 mg/dL   Creatinine, Ser 8.41 0.61 - 1.24 mg/dL   Calcium 9.0 8.9 - 32.4 mg/dL   Total Protein 8.1 6.5 - 8.1 g/dL   Albumin 2.6 (L) 3.5 - 5.0 g/dL   AST 41 15 - 41 U/L    Comment: HEMOLYSIS AT THIS LEVEL MAY AFFECT RESULT   ALT 16 0 - 44 U/L    Comment: HEMOLYSIS AT THIS LEVEL MAY AFFECT RESULT   Alkaline Phosphatase 134 (H) 38 - 126 U/L   Total Bilirubin 0.9 <1.2 mg/dL    Comment: HEMOLYSIS AT THIS LEVEL MAY AFFECT RESULT   GFR, Estimated >60 >60 mL/min    Comment: (NOTE) Calculated using the CKD-EPI Creatinine Equation (2021)    Anion gap 9 5 - 15    Comment: Performed at [facility], [address] [place], [place] [address]  Brain natriuretic peptide     Status: Abnormal   Collection Time: 06/10/23 11:21 PM  Result Value Ref Range   B Natriuretic Peptide 1,133.5 (H) 0.0 - 100.0 pg/mL    Comment: Performed at [facility], [address]  [place], [place] [address]  Troponin I (High Sensitivity)     Status: Abnormal   Collection Time: 06/10/23 11:21 PM  Result Value Ref Range   Troponin I (High Sensitivity) 33 (H) <18 ng/L    Comment: (NOTE) Elevated high sensitivity troponin I (hsTnI) values and significant  changes across serial measurements may suggest ACS but many other  chronic and acute conditions are known to elevate hsTnI results.  Refer to the "Links" section for chest pain algorithms and additional  guidance. Performed at [facility], [address], [place], [place] [address]   SARS Coronavirus 2 by RT PCR (hospital order, performed in [facility] hospital lab) *cepheid single result test* Anterior Nasal Swab     Status: None   Collection Time: 06/11/23 12:39 AM   Specimen: Anterior Nasal Swab  Result Value Ref Range   SARS Coronavirus 2 by RT PCR NEGATIVE NEGATIVE    Comment: (NOTE) SARS-CoV-2 target nucleic acids are NOT DETECTED.  The SARS-CoV-2 RNA is generally detectable in upper and lower respiratory specimens during the acute phase of infection. The lowest concentration of SARS-CoV-2 viral copies this assay can detect is 250 copies / mL. A negative result does not preclude SARS-CoV-2 infection and should not be used as the sole basis for treatment or other patient management decisions.  A negative result may occur with improper specimen collection / handling, submission of specimen other than nasopharyngeal swab, presence of viral mutation(s) within the areas targeted by this assay, and inadequate number of viral copies (<250 copies / mL). A negative result must be combined with clinical observations, patient history, and epidemiological information.  Fact Sheet for Patients:   RoadLapTop.co.za  Fact Sheet for Healthcare Providers: http://kim-miller.com/  This test is not yet approved or  cleared by the [place] FDA and has been  authorized for detection and/or diagnosis of SARS-CoV-2 by FDA under an Emergency Use Authorization (EUA).  This EUA will remain in effect (meaning this test can be used) for the duration of the COVID-19 declaration under Section 564(b)(1) of the Act, 21 U.S.C. section 360bbb-3(b)(1), unless the authorization is terminated or revoked sooner.  Performed at [facility], [address], [place], [place] [address]   Troponin I (High Sensitivity)     Status: Abnormal   Collection Time: 06/11/23  1:42 AM  Result Value Ref Range   Troponin I (High Sensitivity) 29 (H) <18 ng/L    Comment: (NOTE) Elevated high sensitivity troponin I (hsTnI) values and significant  changes across serial measurements may suggest ACS but many other  chronic and acute conditions are known to elevate hsTnI results.  Refer to the "Links" section for chest pain algorithms and additional  guidance. Performed at [facility], [address], [place], [place] [address]   CBC with Differential/Platelet     Status: Abnormal   Collection Time: 06/11/23 10:10 AM  Result Value Ref Range   WBC 12.8 (H) 4.0 - 10.5 K/uL   RBC 3.24 (L) 4.22 - 5.81 MIL/uL   Hemoglobin 9.6 (L) 13.0 - 17.0 g/dL   HCT 91.4 (L) 78.2 - 95.6 %   MCV 92.6 80.0 - 100.0 fL   MCH 29.6 26.0 - 34.0 pg   MCHC 32.0 30.0 - 36.0 g/dL   RDW 21.3 08.6 - 57.8 %   Platelets 646 (H) 150 - 400 K/uL   nRBC 0.0 0.0 - 0.2 %   Neutrophils Relative % 77 %   Neutro Abs 10.0 (H) 1.7 - 7.7 K/uL   Lymphocytes Relative 12 %   Lymphs Abs 1.5 0.7 - 4.0 K/uL   Monocytes Relative 7 %   Monocytes Absolute 0.9 0.1 - 1.0 K/uL   Eosinophils Relative 3 %   Eosinophils Absolute 0.4 0.0 - 0.5 K/uL   Basophils Relative 0 %   Basophils Absolute 0.0 0.0 -  0.1 K/uL   Immature Granulocytes 1 %   Abs Immature Granulocytes 0.06 0.00 - 0.07 K/uL    Comment: Performed at [facility], [address], [place], [place]  [address]  Comprehensive metabolic panel     Status: Abnormal   Collection Time: 06/11/23 10:10 AM  Result Value Ref Range   Sodium 135 135 - 145 mmol/L   Potassium 4.6 3.5 - 5.1 mmol/L   Chloride 100 98 - 111 mmol/L   CO2 25 22 - 32 mmol/L   Glucose, Bld 116 (H) 70 - 99 mg/dL    Comment: Glucose reference range applies only to samples taken after fasting for at least 8 hours.   BUN 17 6 - 20 mg/dL   Creatinine, Ser 6.04 (H) 0.61 - 1.24 mg/dL   Calcium 8.5 (L) 8.9 - 10.3 mg/dL   Total Protein 6.7 6.5 - 8.1 g/dL   Albumin 2.1 (L) 3.5 - 5.0 g/dL   AST 25 15 - 41 U/L   ALT 13 0 - 44 U/L   Alkaline Phosphatase 112 38 - 126 U/L   Total Bilirubin 0.5 <1.2 mg/dL   GFR, Estimated >54 >09 mL/min    Comment: (NOTE) Calculated using the CKD-EPI Creatinine Equation (2021)    Anion gap 10 5 - 15    Comment: Performed at [facility], [address] [place], [place] [address]  Magnesium     Status: None   Collection Time: 06/11/23 10:10 AM  Result Value Ref Range   Magnesium 2.0 1.7 - 2.4 mg/dL    Comment: Performed at [facility], [address] [place], [place] [address]  Phosphorus     Status: None   Collection Time: 06/11/23 10:10 AM  Result Value Ref Range   Phosphorus 3.5 2.5 - 4.6 mg/dL    Comment: Performed at [facility], [address], [place], [place] [address]   DG Chest 1 View  Result Date: 06/11/2023 CLINICAL DATA:  Shortness of breath, recent diagnosis of pneumonia. EXAM: CHEST  1 VIEW COMPARISON:  06/10/2023, 06/12/2023. FINDINGS: Single lateral view of the chest was obtained. Patchy airspace disease is present in the mid to lower lung fields. There suspected consolidation in the right upper lobe. No acute osseous abnormality is seen. IMPRESSION: Patchy airspace disease in the mid to lower lung fields, compatible with known multifocal pneumonia. There is suspected consolidation in the right upper lobe. Repeat AP view  of the chest is recommended. Electronically Signed   By: Thornell Sartorius M.D.   On: 06/11/2023 01:38   DG Chest Portable 1 View  Result Date: 06/11/2023 CLINICAL DATA:  Recent diagnosis with pneumonia, reports he cannot take a deep breath without pain. EXAM: PORTABLE CHEST 1 VIEW COMPARISON:  June 04, 2023 FINDINGS: The cardiac silhouette is mildly enlarged and unchanged in size. Low lung volumes are seen with mild to moderate severity bibasilar infiltrates, left greater than right. This is mildly decreased in severity when compared to the prior study. No pleural effusion or pneumothorax is identified. The visualized skeletal structures are unremarkable. IMPRESSION: Low lung volumes with mild to moderate severity bibasilar infiltrates, left greater than right. Electronically Signed   By: Aram Candela M.D.   On: 06/11/2023 00:12    Pending Labs Unresulted Labs (From admission, onward)     Start     Ordered   06/12/23 0500  Creatinine, serum  Tomorrow morning,   R  06/11/23 1044            Vitals/Pain Today's Vitals   06/11/23 1015 06/11/23 1030 06/11/23 1044 06/11/23 1045  BP:  119/87 113/80   Pulse:   68   Resp: 19 (!) 22 19 (!) 33  Temp:   98.5 F (36.9 C)   TempSrc:      SpO2:   100%   PainSc:        Isolation Precautions No active isolations  Medications Medications  acetaminophen (TYLENOL) tablet 650 mg (has no administration in time range)    Or  acetaminophen (TYLENOL) suppository 650 mg (has no administration in time range)  melatonin tablet 3 mg (has no administration in time range)  ondansetron (ZOFRAN) injection 4 mg (has no administration in time range)  naloxone [facility]) injection 0.4 mg (has no administration in time range)  fentaNYL (SUBLIMAZE) injection 25 mcg (25 mcg Intravenous Given 06/11/23 1014)  ceFEPIme (MAXIPIME) 2 g in sodium chloride 0.9 % 100 mL IVPB (0 g Intravenous Stopped 06/11/23 0539)  vancomycin (VANCOREADY) IVPB 750 mg/150 mL  (has no administration in time range)  carvedilol (COREG) tablet 3.125 mg (3.125 mg Oral Given 06/11/23 1004)  atorvastatin (LIPITOR) tablet 40 mg (40 mg Oral Given 06/11/23 1004)  apixaban (ELIQUIS) tablet 5 mg (5 mg Oral Given 06/11/23 1004)  clopidogrel (PLAVIX) tablet 75 mg (75 mg Oral Given 06/11/23 1037)  furosemide (LASIX) tablet 80 mg (80 mg Oral Given 06/11/23 1004)  ipratropium-albuterol (DUONEB) 0.5-2.5 (3) MG/3ML nebulizer solution 3 mL (3 mLs Nebulization Given 06/11/23 0143)  furosemide (LASIX) injection 40 mg (40 mg Intravenous Given 06/11/23 0143)  vancomycin (VANCOREADY) IVPB 1500 mg/300 mL (0 mg Intravenous Stopped 06/11/23 0740)    Mobility walks     Focused Assessments Cardiac Assessment Handoff:  Cardiac Rhythm: Normal sinus rhythm No results found for: "CKTOTAL", "CKMB", "CKMBINDEX", "TROPONINI" No results found for: "DDIMER" Does the Patient currently have chest pain? No    R Recommendations: See Admitting Provider Note  Report given to:   Additional Notes:

## 2023-06-11 NOTE — Plan of Care (Addendum)
Patient Alert, VSS, afebrile. Patient c/o some lower left rib discomfort/pain, relieved with rest or repositioning. Can ambulate, but unsteady due to weakness. On 2L [place]. External cath with standard drainage bag in place. Patient states he is homeless but was working on finding an apartment before this hospitalization. Sister is emergency contact.   Problem: Education: Goal: Knowledge of General Education information will improve Description: Including pain rating scale, medication(s)/side effects and non-pharmacologic comfort measures Outcome: Progressing   Problem: Health Behavior/Discharge Planning: Goal: Ability to manage health-related needs will improve Outcome: Progressing   Problem: Clinical Measurements: Goal: Ability to maintain clinical measurements within normal limits will improve Outcome: Progressing Goal: Will remain free from infection Outcome: Progressing Goal: Diagnostic test results will improve Outcome: Progressing Goal: Respiratory complications will improve Outcome: Progressing Goal: Cardiovascular complication will be avoided Outcome: Progressing   Problem: Activity: Goal: Risk for activity intolerance will decrease Outcome: Progressing   Problem: Nutrition: Goal: Adequate nutrition will be maintained Outcome: Progressing   Problem: Coping: Goal: Level of anxiety will decrease Outcome: Progressing   Problem: Elimination: Goal: Will not experience complications related to bowel motility Outcome: Progressing Goal: Will not experience complications related to urinary retention Outcome: Progressing   Problem: Pain Management: Goal: General experience of comfort will improve Outcome: Progressing   Problem: Safety: Goal: Ability to remain free from injury will improve Outcome: Progressing   Problem: Skin Integrity: Goal: Risk for impaired skin integrity will decrease Outcome: Progressing

## 2023-06-11 NOTE — Progress Notes (Signed)
  Carryover admission to the Day Admitter.  I discussed this case with the EDP, Sherian Maroon, PA.  Per these discussions:   This is a 52 year old male who is being admitted as a bounce back for multifocal pneumonia.  He was discharged for multifocal pneumonia from the hospital on 06/10/2023, before presenting back to the emergency room this evening complaining of interval worsening in shortness of breath, cough, noting right-sided chest discomfort worse with cough, in the absence of any exertional chest pain and in the absence of any substernal or left-sided chest pain.  Vital signs notable for mild tachycardia, tachypnea, in the absence of any acute hypoxia.  Chest x-ray shows persistence of multifocal pneumonia.  I have placed an order for inpatient admission for further evaluation and management of the above.  I have placed some additional preliminary admit orders via the adult multi-morbid admission order set. I have also ordered IV vancomycin, cefepime, as well as prn IV fentanyl for his pleuritic right-sided chest discomfort.  I have also ordered prn IV Zofran, as well as morning labs that include CMP, CBC, magnesium and phosphorus levels.    Newton Pigg, DO Hospitalist

## 2023-06-11 NOTE — ED Notes (Signed)
Pt currently eating ham sandwich

## 2023-06-12 ENCOUNTER — Inpatient Hospital Stay (HOSPITAL_COMMUNITY): Payer: 59

## 2023-06-12 ENCOUNTER — Ambulatory Visit: Payer: Medicaid Other | Admitting: Dietician

## 2023-06-12 DIAGNOSIS — J189 Pneumonia, unspecified organism: Secondary | ICD-10-CM | POA: Diagnosis not present

## 2023-06-12 LAB — CBC
HCT: 31.8 % — ABNORMAL LOW (ref 39.0–52.0)
Hemoglobin: 10 g/dL — ABNORMAL LOW (ref 13.0–17.0)
MCH: 29.6 pg (ref 26.0–34.0)
MCHC: 31.4 g/dL (ref 30.0–36.0)
MCV: 94.1 fL (ref 80.0–100.0)
Platelets: 681 10*3/uL — ABNORMAL HIGH (ref 150–400)
RBC: 3.38 MIL/uL — ABNORMAL LOW (ref 4.22–5.81)
RDW: 14.1 % (ref 11.5–15.5)
WBC: 20.1 10*3/uL — ABNORMAL HIGH (ref 4.0–10.5)
nRBC: 0 % (ref 0.0–0.2)

## 2023-06-12 LAB — BASIC METABOLIC PANEL
Anion gap: 8 (ref 5–15)
Anion gap: 8 (ref 5–15)
BUN: 26 mg/dL — ABNORMAL HIGH (ref 6–20)
BUN: 32 mg/dL — ABNORMAL HIGH (ref 6–20)
CO2: 26 mmol/L (ref 22–32)
CO2: 27 mmol/L (ref 22–32)
Calcium: 8.6 mg/dL — ABNORMAL LOW (ref 8.9–10.3)
Calcium: 8.6 mg/dL — ABNORMAL LOW (ref 8.9–10.3)
Chloride: 99 mmol/L (ref 98–111)
Chloride: 102 mmol/L (ref 98–111)
Creatinine, Ser: 1.47 mg/dL — ABNORMAL HIGH (ref 0.61–1.24)
Creatinine, Ser: 1.67 mg/dL — ABNORMAL HIGH (ref 0.61–1.24)
GFR, Estimated: 58 mL/min — ABNORMAL LOW (ref >60)
GFR, Estimated: 50 mL/min — ABNORMAL LOW (ref >60)
Glucose, Bld: 145 mg/dL — ABNORMAL HIGH (ref 70–99)
Glucose, Bld: 130 mg/dL — ABNORMAL HIGH (ref 70–99)
Potassium: 4.6 mmol/L (ref 3.5–5.1)
Potassium: 4.2 mmol/L (ref 3.5–5.1)
Sodium: 133 mmol/L — ABNORMAL LOW (ref 135–145)
Sodium: 137 mmol/L (ref 135–145)

## 2023-06-12 LAB — RESPIRATORY PANEL BY PCR

## 2023-06-12 LAB — CREATININE, SERUM
Creatinine, Ser: 1.52 mg/dL — ABNORMAL HIGH (ref 0.61–1.24)
GFR, Estimated: 55 mL/min — ABNORMAL LOW (ref >60)

## 2023-06-12 LAB — PROCALCITONIN: Procalcitonin: <0.1 ng/mL

## 2023-06-12 MED ORDER — ALBUTEROL SULFATE (2.5 MG/3ML) 0.083% IN NEBU: 2.5000 mg | INHALATION_SOLUTION | RESPIRATORY_TRACT | Status: DC | PRN

## 2023-06-12 MED ORDER — DICLOFENAC SODIUM 1 % EX GEL
2.0000 g | Freq: Four times a day (QID) | CUTANEOUS | Status: DC
  Administered 2023-06-12 – 2023-06-13 (×3): 2 g via TOPICAL
  Filled 2023-06-12: qty 100

## 2023-06-12 MED ORDER — METHOCARBAMOL 500 MG PO TABS
750.0000 mg | ORAL_TABLET | Freq: Three times a day (TID) | ORAL | Status: DC | PRN
  Administered 2023-06-13: 750 mg via ORAL
  Filled 2023-06-12: qty 2

## 2023-06-12 MED ORDER — SODIUM CHLORIDE 0.9 % IV SOLN
2.0000 g | Freq: Two times a day (BID) | INTRAVENOUS | Status: DC
  Administered 2023-06-12: 2 g via INTRAVENOUS
  Filled 2023-06-12: qty 12.5

## 2023-06-12 MED ORDER — IPRATROPIUM-ALBUTEROL 0.5-2.5 (3) MG/3ML IN SOLN
3.0000 mL | Freq: Three times a day (TID) | RESPIRATORY_TRACT | Status: DC
  Administered 2023-06-12 – 2023-06-13 (×3): 3 mL via RESPIRATORY_TRACT
  Filled 2023-06-12 (×3): qty 3

## 2023-06-12 MED ORDER — SODIUM CHLORIDE 0.9 % IV BOLUS
250.0000 mL | Freq: Once | INTRAVENOUS | Status: AC
  Administered 2023-06-12: 250 mL via INTRAVENOUS

## 2023-06-12 MED ORDER — VANCOMYCIN HCL 1250 MG/250ML IV SOLN
1250.0000 mg | INTRAVENOUS | Status: DC
  Filled 2023-06-12: qty 250

## 2023-06-12 NOTE — Plan of Care (Signed)
  Problem: Health Behavior/Discharge Planning: Goal: Ability to manage health-related needs will improve Outcome: Progressing   Problem: Nutrition: Goal: Adequate nutrition will be maintained Outcome: Progressing   Problem: Coping: Goal: Level of anxiety will decrease Outcome: Progressing   Problem: Clinical Measurements: Goal: Ability to maintain a body temperature in the normal range will improve Outcome: Progressing

## 2023-06-12 NOTE — Progress Notes (Incomplete)
PROGRESS NOTE    Craig Schmidt  YQM:[id number] DOB: 02/06/1971 DOA: 06/10/2023 PCP: Rema Fendt, NP   Brief Narrative: No notes on file   Assessment and Plan:  *** ***   DVT prophylaxis: *** Code Status:   Code Status: Full Code Family Communication: *** Disposition Plan: ***   Consultants:  ***  Procedures:  ***  Antimicrobials: ***    Subjective: ***  Objective: BP (!) 127/92   Pulse (!) 103   Temp 98 F (36.7 C)   Resp 20   Ht 5\' 7"  (1.702 m)   Wt 64.8 kg   SpO2 100%   BMI 22.37 kg/m   Examination:  General exam: Appears calm and comfortable *** Respiratory system: Clear to auscultation. Respiratory effort normal. Cardiovascular system: S1 & S2 heard, RRR. No murmurs, rubs, gallops or clicks. Gastrointestinal system: Abdomen is nondistended, soft and nontender. No organomegaly or masses felt. Normal bowel sounds heard. Central nervous system: Alert and oriented. No focal neurological deficits. Musculoskeletal: No edema. No calf tenderness Skin: No cyanosis. No rashes Psychiatry: Judgement and insight appear normal. Mood & affect appropriate.    Data Reviewed: I have personally reviewed following labs and imaging studies  CBC Lab Results  Component Value Date   WBC 20.1 (H) 06/12/2023   RBC 3.38 (L) 06/12/2023   HGB 10.0 (L) 06/12/2023   HCT 31.8 (L) 06/12/2023   MCV 94.1 06/12/2023   MCH 29.6 06/12/2023   PLT 681 (H) 06/12/2023   MCHC 31.4 06/12/2023   RDW 14.1 06/12/2023   LYMPHSABS 1.5 06/11/2023   MONOABS 0.9 06/11/2023   EOSABS 0.4 06/11/2023   BASOSABS 0.0 06/11/2023     Last metabolic panel Lab Results  Component Value Date   NA 133 (L) 06/12/2023   K 4.6 06/12/2023   CL 99 06/12/2023   CO2 26 06/12/2023   BUN 26 (H) 06/12/2023   CREATININE 1.52 (H) 06/12/2023   CREATININE 1.47 (H) 06/12/2023   GLUCOSE 145 (H) 06/12/2023   GFRNONAA 55 (L) 06/12/2023   GFRNONAA 58 (L) 06/12/2023   GFRAA 52 (L) 10/03/2018    CALCIUM 8.6 (L) 06/12/2023   PHOS 3.5 06/11/2023   PROT 6.7 06/11/2023   ALBUMIN 2.1 (L) 06/11/2023   BILITOT 0.5 06/11/2023   ALKPHOS 112 06/11/2023   AST 25 06/11/2023   ALT 13 06/11/2023   ANIONGAP 8 06/12/2023    GFR: Estimated Creatinine Clearance: 53.3 mL/min (A) (by C-G formula based on SCr of 1.52 mg/dL (H)).  Recent Results (from the past 240 hour(s))  Culture, blood (Routine X 2) w Reflex to ID Panel     Status: None   Collection Time: 06/04/23 10:59 AM   Specimen: BLOOD LEFT ARM  Result Value Ref Range Status   Specimen Description BLOOD LEFT ARM  Final   Special Requests   Final    BOTTLES DRAWN AEROBIC AND ANAEROBIC Blood Culture results may not be optimal due to an inadequate volume of blood received in culture bottles   Culture   Final    NO GROWTH 5 DAYS Performed at [facility], [address], [place], [place] [address]    Report Status 06/09/2023 FINAL  Final  Culture, blood (Routine X 2) w Reflex to ID Panel     Status: None   Collection Time: 06/04/23 10:59 AM   Specimen: BLOOD  Result Value Ref Range Status   Specimen Description BLOOD SITE NOT SPECIFIED  Final   Special Requests  Final    BOTTLES DRAWN AEROBIC AND ANAEROBIC Blood Culture adequate volume   Culture   Final    NO GROWTH 5 DAYS Performed at [facility], [address] [place], [place] [address]    Report Status 06/09/2023 FINAL  Final  SARS Coronavirus 2 by RT PCR (hospital order, performed in [facility] hospital lab) *cepheid single result test* Anterior Nasal Swab     Status: None   Collection Time: 06/11/23 12:39 AM   Specimen: Anterior Nasal Swab  Result Value Ref Range Status   SARS Coronavirus 2 by RT PCR NEGATIVE NEGATIVE Final    Comment: (NOTE) SARS-CoV-2 target nucleic acids are NOT DETECTED.  The SARS-CoV-2 RNA is generally detectable in upper and lower respiratory specimens during the acute phase of infection. The lowest concentration of SARS-CoV-2  viral copies this assay can detect is 250 copies / mL. A negative result does not preclude SARS-CoV-2 infection and should not be used as the sole basis for treatment or other patient management decisions.  A negative result may occur with improper specimen collection / handling, submission of specimen other than nasopharyngeal swab, presence of viral mutation(s) within the areas targeted by this assay, and inadequate number of viral copies (<250 copies / mL). A negative result must be combined with clinical observations, patient history, and epidemiological information.  Fact Sheet for Patients:   RoadLapTop.co.za  Fact Sheet for Healthcare Providers: http://kim-miller.com/  This test is not yet approved or  cleared by the [place] FDA and has been authorized for detection and/or diagnosis of SARS-CoV-2 by FDA under an Emergency Use Authorization (EUA).  This EUA will remain in effect (meaning this test can be used) for the duration of the COVID-19 declaration under Section 564(b)(1) of the Act, 21 U.S.C. section 360bbb-3(b)(1), unless the authorization is terminated or revoked sooner.  Performed at [facility], [address], [place], [place] [address]       Radiology Studies: DG Chest 1 View  Result Date: 06/11/2023 CLINICAL DATA:  Shortness of breath, recent diagnosis of pneumonia. EXAM: CHEST  1 VIEW COMPARISON:  06/10/2023, 06/12/2023. FINDINGS: Single lateral view of the chest was obtained. Patchy airspace disease is present in the mid to lower lung fields. There suspected consolidation in the right upper lobe. No acute osseous abnormality is seen. IMPRESSION: Patchy airspace disease in the mid to lower lung fields, compatible with known multifocal pneumonia. There is suspected consolidation in the right upper lobe. Repeat AP view of the chest is recommended. Electronically Signed   By: Thornell Sartorius M.D.    On: 06/11/2023 01:38   DG Chest Portable 1 View  Result Date: 06/11/2023 CLINICAL DATA:  Recent diagnosis with pneumonia, reports he cannot take a deep breath without pain. EXAM: PORTABLE CHEST 1 VIEW COMPARISON:  June 04, 2023 FINDINGS: The cardiac silhouette is mildly enlarged and unchanged in size. Low lung volumes are seen with mild to moderate severity bibasilar infiltrates, left greater than right. This is mildly decreased in severity when compared to the prior study. No pleural effusion or pneumothorax is identified. The visualized skeletal structures are unremarkable. IMPRESSION: Low lung volumes with mild to moderate severity bibasilar infiltrates, left greater than right. Electronically Signed   By: Aram Candela M.D.   On: 06/11/2023 00:12      LOS: 1 day    Jacquelin Hawking, MD [facility] 06/12/2023, 2:39 PM   If 7PM-7AM, please contact night-coverage [email or web address]

## 2023-06-12 NOTE — Progress Notes (Signed)
PROGRESS NOTE    Craig Schmidt  [id number]:[id number] DOB: 1970/12/18 DOA: 06/10/2023 PCP: Rema Fendt, NP   Brief Narrative: Craig Schmidt is a 52 y.o. male with a history of cocaine abuse, alcohol abuse, medication non-adherence, CAD, cardiomyopathy, NSTEMI, HFrEF, CVA, hyperlipidemia, CKD.  Patient presented secondary to shortness of breath with concern for persistent multifocal pneumonia. Patient also with concern for fluid overload, treated with Lasix. Empiric antibiotics started.   Assessment and Plan:  Multifocal pneumonia Recurrent diagnosis. Patient was recently treated with full treatment course of antibiotics. Unclear if he has an ongoing infection. Patient started empirically on Vancomycin and Cefepime. -Continue Vancomycin/Cefepime -Check procalcitonin, repeat BMP, repeat chest x-ray (2-view) -Will consider consulting ID if concern for ongoing pneumonia  Leukocytosis WBC of 12,800 on admission with worsening to 20,100 after steroids. Likely demargination from steroids -CBC in AM  History of stroke Noted. On Eliquis and Plavix -Continue Eliquis and Plavix  Right-sided back pain From previous admission, consistent with musculoskeletal pain. -Voltaren, Robaxin, heat  CAD History of STEMI. Patient with troponin elevation this admission with peak troponin of 74 with delta of 59, not consistent with ACS. -Continue Aspirin, Lipitor and Plavix  Acute on chronic systolic heart failure Concern for exacerbation on admission. Patient treated with Lasix IV and transitioned to home Lasix. -Continue Coreg -Hold Lasix for now secondary to rising serum creatinine -Daily weights -Strict in/out  Primary hypertension -Continue Coreg  CKD stage II Baseline creatinine appears to be around 1.2-1.4. Slightly worsened renal function. -Repeat BMP   DVT prophylaxis: Eliquis Code Status:   Code Status: Full Code Family Communication: Wife at bedside Disposition Plan:  Discharge pending outpatient antibiotic regimen if needed, in addition to PT/OT recommendations   Consultants:  None  Procedures:  None  Antimicrobials: Vancomycin Cefepime   Subjective: Ongoing right sided chest pain. Non-productive cough.  Objective: BP (!) 127/92   Pulse (!) 103   Temp 98 F (36.7 C)   Resp 20   Ht 5\' 7"  (1.702 m)   Wt 64.8 kg   SpO2 100%   BMI 22.37 kg/m   Examination:  General exam: Appears calm and comfortable Respiratory system: Clear to auscultation. Respiratory effort normal. Cardiovascular system: S1 & S2 heard, RRR. Gastrointestinal system: Abdomen is nondistended, soft and nontender. Normal bowel sounds heard. Central nervous system: Alert and oriented. No focal neurological deficits. Musculoskeletal: No edema. No calf tenderness Psychiatry: Judgement and insight appear normal. Mood & affect appropriate.    Data Reviewed: I have personally reviewed following labs and imaging studies  CBC Lab Results  Component Value Date   WBC 20.1 (H) 06/12/2023   RBC 3.38 (L) 06/12/2023   HGB 10.0 (L) 06/12/2023   HCT 31.8 (L) 06/12/2023   MCV 94.1 06/12/2023   MCH 29.6 06/12/2023   PLT 681 (H) 06/12/2023   MCHC 31.4 06/12/2023   RDW 14.1 06/12/2023   LYMPHSABS 1.5 06/11/2023   MONOABS 0.9 06/11/2023   EOSABS 0.4 06/11/2023   BASOSABS 0.0 06/11/2023     Last metabolic panel Lab Results  Component Value Date   NA 133 (L) 06/12/2023   K 4.6 06/12/2023   CL 99 06/12/2023   CO2 26 06/12/2023   BUN 26 (H) 06/12/2023   CREATININE 1.52 (H) 06/12/2023   CREATININE 1.47 (H) 06/12/2023   GLUCOSE 145 (H) 06/12/2023   GFRNONAA 55 (L) 06/12/2023   GFRNONAA 58 (L) 06/12/2023   GFRAA 52 (L) 10/03/2018   CALCIUM 8.6 (L) 06/12/2023  PHOS 3.5 06/11/2023   PROT 6.7 06/11/2023   ALBUMIN 2.1 (L) 06/11/2023   BILITOT 0.5 06/11/2023   ALKPHOS 112 06/11/2023   AST 25 06/11/2023   ALT 13 06/11/2023   ANIONGAP 8 06/12/2023    GFR: Estimated  Creatinine Clearance: 53.3 mL/min (A) (by C-G formula based on SCr of 1.52 mg/dL (H)).  Recent Results (from the past 240 hour(s))  Culture, blood (Routine X 2) w Reflex to ID Panel     Status: None   Collection Time: 06/04/23 10:59 AM   Specimen: BLOOD LEFT ARM  Result Value Ref Range Status   Specimen Description BLOOD LEFT ARM  Final   Special Requests   Final    BOTTLES DRAWN AEROBIC AND ANAEROBIC Blood Culture results may not be optimal due to an inadequate volume of blood received in culture bottles   Culture   Final    NO GROWTH 5 DAYS Performed at [facility], [address], [place], [place] [address]    Report Status 06/09/2023 FINAL  Final  Culture, blood (Routine X 2) w Reflex to ID Panel     Status: None   Collection Time: 06/04/23 10:59 AM   Specimen: BLOOD  Result Value Ref Range Status   Specimen Description BLOOD SITE NOT SPECIFIED  Final   Special Requests   Final    BOTTLES DRAWN AEROBIC AND ANAEROBIC Blood Culture adequate volume   Culture   Final    NO GROWTH 5 DAYS Performed at [facility], [address], [place], [place] [address]    Report Status 06/09/2023 FINAL  Final  SARS Coronavirus 2 by RT PCR (hospital order, performed in [facility] hospital lab) *cepheid single result test* Anterior Nasal Swab     Status: None   Collection Time: 06/11/23 12:39 AM   Specimen: Anterior Nasal Swab  Result Value Ref Range Status   SARS Coronavirus 2 by RT PCR NEGATIVE NEGATIVE Final    Comment: (NOTE) SARS-CoV-2 target nucleic acids are NOT DETECTED.  The SARS-CoV-2 RNA is generally detectable in upper and lower respiratory specimens during the acute phase of infection. The lowest concentration of SARS-CoV-2 viral copies this assay can detect is 250 copies / mL. A negative result does not preclude SARS-CoV-2 infection and should not be used as the sole basis for treatment or other patient management decisions.  A negative result may occur  with improper specimen collection / handling, submission of specimen other than nasopharyngeal swab, presence of viral mutation(s) within the areas targeted by this assay, and inadequate number of viral copies (<250 copies / mL). A negative result must be combined with clinical observations, patient history, and epidemiological information.  Fact Sheet for Patients:   RoadLapTop.co.za  Fact Sheet for Healthcare Providers: http://kim-miller.com/  This test is not yet approved or  cleared by the [place] FDA and has been authorized for detection and/or diagnosis of SARS-CoV-2 by FDA under an Emergency Use Authorization (EUA).  This EUA will remain in effect (meaning this test can be used) for the duration of the COVID-19 declaration under Section 564(b)(1) of the Act, 21 U.S.C. section 360bbb-3(b)(1), unless the authorization is terminated or revoked sooner.  Performed at [facility], [address], [place], [place] [address]       Radiology Studies: DG Chest 1 View  Result Date: 06/11/2023 CLINICAL DATA:  Shortness of breath, recent diagnosis of pneumonia. EXAM: CHEST  1 VIEW COMPARISON:  06/10/2023, 06/12/2023. FINDINGS: Single  lateral view of the chest was obtained. Patchy airspace disease is present in the mid to lower lung fields. There suspected consolidation in the right upper lobe. No acute osseous abnormality is seen. IMPRESSION: Patchy airspace disease in the mid to lower lung fields, compatible with known multifocal pneumonia. There is suspected consolidation in the right upper lobe. Repeat AP view of the chest is recommended. Electronically Signed   By: Thornell Sartorius M.D.   On: 06/11/2023 01:38   DG Chest Portable 1 View  Result Date: 06/11/2023 CLINICAL DATA:  Recent diagnosis with pneumonia, reports he cannot take a deep breath without pain. EXAM: PORTABLE CHEST 1 VIEW COMPARISON:  June 04, 2023  FINDINGS: The cardiac silhouette is mildly enlarged and unchanged in size. Low lung volumes are seen with mild to moderate severity bibasilar infiltrates, left greater than right. This is mildly decreased in severity when compared to the prior study. No pleural effusion or pneumothorax is identified. The visualized skeletal structures are unremarkable. IMPRESSION: Low lung volumes with mild to moderate severity bibasilar infiltrates, left greater than right. Electronically Signed   By: Aram Candela M.D.   On: 06/11/2023 00:12      LOS: 1 day    Jacquelin Hawking, MD [facility] 06/12/2023, 2:52 PM   If 7PM-7AM, please contact night-coverage [email or web address]

## 2023-06-12 NOTE — Progress Notes (Signed)
Heart Failure Navigator Progress Note  Assessed for Heart & Vascular TOC clinic readiness.  Patient does not meet criteria due to Advanced Heart Failure Team patient of Dr. McLean.   Navigator will sign off at this time.   Mahreen Schewe, BSN, RN Heart Failure Nurse Navigator Secure Chat Only   

## 2023-06-12 NOTE — Plan of Care (Signed)

## 2023-06-12 NOTE — Plan of Care (Addendum)
Patient placed on droplet precautions pending resp viral panel. FR 1200 in place, educated patient and visitor about excessive fluid intake. External catheter in use, urine almost clear no color. BM today on BSC. Unsteady gait. RUE and LUE weakness continues. Skin intact. No falls this shift.   Problem: Education: Goal: Knowledge of General Education information will improve Description: Including pain rating scale, medication(s)/side effects and non-pharmacologic comfort measures Outcome: Progressing   Problem: Health Behavior/Discharge Planning: Goal: Ability to manage health-related needs will improve Outcome: Progressing   Problem: Clinical Measurements: Goal: Ability to maintain clinical measurements within normal limits will improve Outcome: Progressing Goal: Will remain free from infection Outcome: Progressing Goal: Diagnostic test results will improve Outcome: Progressing Goal: Respiratory complications will improve Outcome: Progressing Goal: Cardiovascular complication will be avoided Outcome: Progressing   Problem: Activity: Goal: Risk for activity intolerance will decrease Outcome: Progressing   Problem: Nutrition: Goal: Adequate nutrition will be maintained Outcome: Progressing   Problem: Coping: Goal: Level of anxiety will decrease Outcome: Progressing   Problem: Elimination: Goal: Will not experience complications related to bowel motility Outcome: Progressing Goal: Will not experience complications related to urinary retention Outcome: Progressing   Problem: Pain Management: Goal: General experience of comfort will improve Outcome: Progressing   Problem: Safety: Goal: Ability to remain free from injury will improve Outcome: Progressing   Problem: Skin Integrity: Goal: Risk for impaired skin integrity will decrease Outcome: Progressing   Problem: Activity: Goal: Ability to tolerate increased activity will improve Outcome: Progressing   Problem:  Clinical Measurements: Goal: Ability to maintain a body temperature in the normal range will improve Outcome: Progressing   Problem: Respiratory: Goal: Ability to maintain adequate ventilation will improve Outcome: Progressing Goal: Ability to maintain a clear airway will improve Outcome: Progressing

## 2023-06-12 NOTE — Hospital Course (Signed)
Craig Schmidt is a 52 y.o. male with a history of cocaine abuse, alcohol abuse, medication non-adherence, CAD, cardiomyopathy, NSTEMI, HFrEF, CVA, hyperlipidemia, CKD.  Patient presented secondary to shortness of breath with concern for persistent multifocal pneumonia. Patient also with concern for fluid overload, treated with Lasix. Empiric antibiotics started.

## 2023-06-13 ENCOUNTER — Other Ambulatory Visit (HOSPITAL_COMMUNITY): Payer: Self-pay

## 2023-06-13 DIAGNOSIS — J189 Pneumonia, unspecified organism: Secondary | ICD-10-CM | POA: Diagnosis not present

## 2023-06-13 LAB — BASIC METABOLIC PANEL
Anion gap: 8 (ref 5–15)
BUN: 30 mg/dL — ABNORMAL HIGH (ref 6–20)
CO2: 25 mmol/L (ref 22–32)
Calcium: 8.7 mg/dL — ABNORMAL LOW (ref 8.9–10.3)
Chloride: 103 mmol/L (ref 98–111)
Creatinine, Ser: 1.21 mg/dL (ref 0.61–1.24)
GFR, Estimated: >60 mL/min (ref >60)
Glucose, Bld: 101 mg/dL — ABNORMAL HIGH (ref 70–99)
Potassium: 4.4 mmol/L (ref 3.5–5.1)
Sodium: 136 mmol/L (ref 135–145)

## 2023-06-13 LAB — CBC
HCT: 32.5 % — ABNORMAL LOW (ref 39.0–52.0)
Hemoglobin: 10.5 g/dL — ABNORMAL LOW (ref 13.0–17.0)
MCH: 29.9 pg (ref 26.0–34.0)
MCHC: 32.3 g/dL (ref 30.0–36.0)
MCV: 92.6 fL (ref 80.0–100.0)
Platelets: 650 10*3/uL — ABNORMAL HIGH (ref 150–400)
RBC: 3.51 MIL/uL — ABNORMAL LOW (ref 4.22–5.81)
RDW: 14.5 % (ref 11.5–15.5)
WBC: 13.8 10*3/uL — ABNORMAL HIGH (ref 4.0–10.5)
nRBC: 0 % (ref 0.0–0.2)

## 2023-06-13 MED ORDER — TRAMADOL HCL 50 MG PO TABS
50.0000 mg | ORAL_TABLET | Freq: Four times a day (QID) | ORAL | 0 refills | Status: AC | PRN
  Filled 2023-06-13: qty 12, 3d supply, fill #0

## 2023-06-13 MED ORDER — IPRATROPIUM-ALBUTEROL 0.5-2.5 (3) MG/3ML IN SOLN: 3.0000 mL | Freq: Two times a day (BID) | RESPIRATORY_TRACT | Status: DC

## 2023-06-13 MED ORDER — METHOCARBAMOL 750 MG PO TABS
750.0000 mg | ORAL_TABLET | Freq: Three times a day (TID) | ORAL | 0 refills | Status: AC | PRN
  Filled 2023-06-13 (×2): qty 15, 5d supply, fill #0

## 2023-06-13 NOTE — Discharge Summary (Signed)
Physician Discharge Summary   Patient: Craig Schmidt MRN: [id number] DOB: 09-15-1970  Admit date:     06/10/2023  Discharge date: 06/13/23  Discharge Physician: Jacquelin Hawking, MD   PCP: Rema Fendt, NP   Recommendations at discharge:  PCP visit for hospital follow-up  Discharge Diagnoses: Principal Problem:   Multifocal pneumonia Active Problems:   Homeless   Protein-calorie malnutrition, severe   Smoker   Alcohol abuse   Essential hypertension   Dyslipidemia   Acute on chronic combined systolic and diastolic heart failure (HCC)   Troponin I above reference range   Hyperkalemia   Hyponatremia   Normocytic anemia   Thrombocytosis  Resolved Problems:   * No resolved hospital problems. *  Hospital Course: Craig Schmidt is a 52 y.o. male with a history of cocaine abuse, alcohol abuse, medication non-adherence, CAD, cardiomyopathy, NSTEMI, HFrEF, CVA, hyperlipidemia, CKD.  Patient presented secondary to shortness of breath with concern for persistent multifocal pneumonia. Patient also with concern for fluid overload, treated with Lasix. Empiric antibiotics started.  Assessment and Plan:  Multifocal lung infiltrates Initial concern for pneumonia, however patient was also treated with Lasix IV. Patient has had multiple treatment courses for pneumonia. Procalcitonin undetectable. Afebrile. Non-productive cough. Patient was empirically managed with Vancomycin and Cefepime which were discontinued prior to discharge. Recommend repeat 2-view chest x-ray in 3-4 weeks; if persistent infiltrate, recommend pulmonology referral.   Leukocytosis WBC of 12,800 on admission with worsening to 20,100 after steroids. Likely demargination from steroids. Improved prior to discharge.   History of stroke Noted. On Eliquis and Plavix. Continue Eliquis and Plavix.   Right-sided back pain From previous admission, consistent with musculoskeletal pain. Analgesics, Robaxin as needed for  management   CAD History of STEMI. Continue Lipitor and Plavix   Acute on chronic systolic heart failure Concern for exacerbation on admission. Patient treated with Lasix IV and transitioned to home Lasix. Lasix held secondary to overdiuresis. Resume on discharge.   Primary hypertension -Continue Coreg   CKD stage II Baseline creatinine appears to be around 1.2-1.4. Slightly worsened renal function secondary to overdiuresis. Stable prior to discharge after IV fluids,   Consultants: None Procedures performed: None  Disposition: Home Diet recommendation: Sodium restricted and fluid restricted diet   DISCHARGE MEDICATION: Allergies as of 06/13/2023       Reactions   Shellfish Allergy Anaphylaxis   Peanut (diagnostic) Itching        Medication List     TAKE these medications    atorvastatin 40 MG tablet Commonly known as: LIPITOR Take 1 tablet (40 mg total) by mouth daily.   carvedilol 3.125 MG tablet Commonly known as: COREG Take 1 tablet (3.125 mg total) by mouth 2 (two) times daily.   clopidogrel 75 MG tablet Commonly known as: PLAVIX Take 1 tablet (75 mg total) by mouth daily.   Eliquis 5 MG Tabs tablet Generic drug: apixaban Take 1 tablet (5 mg total) by mouth 2 (two) times daily.   furosemide 40 MG tablet Commonly known as: LASIX Take 2 tablets (80 mg total) by mouth daily.   methocarbamol 750 MG tablet Commonly known as: ROBAXIN Take 1 tablet (750 mg total) by mouth every 8 (eight) hours as needed for up to 5 days for muscle spasms.   traMADol 50 MG tablet Commonly known as: ULTRAM Take 1 tablet (50 mg total) by mouth every 6 (six) hours as needed for up to 3 days for severe pain (pain score 7-10) or moderate pain (  pain score 4-6).        Follow-up Information     Rema Fendt, NP. Schedule an appointment as soon as possible for a visit in 1 week(s).   Specialty: Nurse Practitioner Why: For hospital follow-up Contact information: [address] [place] [address] [phone]                Discharge Exam: BP 116/80 (BP Location: Left Arm)   Pulse 95   Temp 97.6 F (36.4 C) (Oral)   Resp 16   Ht 5\' 7"  (1.702 m)   Wt 60.9 kg   SpO2 98%   BMI 21.03 kg/m   General exam: Appears calm and comfortable Respiratory system: Clear to auscultation. Respiratory effort normal. Cardiovascular system: S1 & S2 heard, RRR. Gastrointestinal system: Abdomen is nondistended, soft and nontender. Normal bowel sounds heard. Central nervous system: Alert and oriented. No focal neurological deficits. Psychiatry: Judgement and insight appear normal. Mood & affect appropriate.   Condition at discharge: stable  The results of significant diagnostics from this hospitalization (including imaging, microbiology, ancillary and laboratory) are listed below for reference.   Imaging Studies: DG Chest 2 View  Result Date: 06/12/2023 CLINICAL DATA:  Follow-up multifocal pneumonia EXAM: CHEST - 2 VIEW COMPARISON:  06/10/2023 FINDINGS: Cardiac shadow is enlarged. Patchy airspace opacity is noted in the lateral right lung base. Small effusions are seen bilaterally. No other focal abnormality is noted. IMPRESSION: Small effusions and mild right basilar infiltrate. Electronically Signed   By: Alcide Clever M.D.   On: 06/12/2023 20:11   DG Chest 1 View  Result Date: 06/11/2023 CLINICAL DATA:  Shortness of breath, recent diagnosis of pneumonia. EXAM: CHEST  1 VIEW COMPARISON:  06/10/2023, 06/12/2023. FINDINGS: Single lateral view of the chest was obtained. Patchy airspace disease is present in the mid to lower lung fields. There suspected consolidation in the right upper lobe. No acute osseous abnormality is seen. IMPRESSION: Patchy airspace disease in the mid to lower lung fields, compatible with known multifocal pneumonia. There is suspected consolidation in the right upper lobe. Repeat AP view of the chest is recommended.  Electronically Signed   By: Thornell Sartorius M.D.   On: 06/11/2023 01:38   DG Chest Portable 1 View  Result Date: 06/11/2023 CLINICAL DATA:  Recent diagnosis with pneumonia, reports he cannot take a deep breath without pain. EXAM: PORTABLE CHEST 1 VIEW COMPARISON:  June 04, 2023 FINDINGS: The cardiac silhouette is mildly enlarged and unchanged in size. Low lung volumes are seen with mild to moderate severity bibasilar infiltrates, left greater than right. This is mildly decreased in severity when compared to the prior study. No pleural effusion or pneumothorax is identified. The visualized skeletal structures are unremarkable. IMPRESSION: Low lung volumes with mild to moderate severity bibasilar infiltrates, left greater than right. Electronically Signed   By: Aram Candela M.D.   On: 06/11/2023 00:12   CT Angio Chest PE W and/or Wo Contrast  Result Date: 06/04/2023 CLINICAL DATA:  52 year old male with history of chest pain. Suspected pulmonary embolism. EXAM: CT ANGIOGRAPHY CHEST WITH CONTRAST TECHNIQUE: Multidetector CT imaging of the chest was performed using the standard protocol during bolus administration of intravenous contrast. Multiplanar CT image reconstructions and MIPs were obtained to evaluate the vascular anatomy. RADIATION DOSE REDUCTION: This exam was performed according to the departmental dose-optimization program which includes automated exposure control, adjustment of the mA and/or kV according to patient size and/or use of iterative reconstruction technique. CONTRAST:  75mL OMNIPAQUE IOHEXOL 350 MG/ML SOLN COMPARISON:  Chest CT 05/30/2023. FINDINGS: Cardiovascular: There are no filling defects in the pulmonary arterial tree to suggest pulmonary embolism. Heart size is enlarged with left ventricular dilatation. There is no significant pericardial fluid, thickening or pericardial calcification. No atherosclerotic calcifications are noted in the thoracic aorta or the coronary  arteries. Mediastinum/Nodes: Multiple prominent borderline enlarged mediastinal and hilar lymph nodes, nonspecific, but likely reactive. Esophagus is unremarkable in appearance. No axillary lymphadenopathy. Lungs/Pleura: Worsening patchy areas of airspace consolidation are noted throughout the lungs bilaterally, most evident in the mid to lower lungs, particularly in the lower lobes of the lungs where there is extensive ground-glass attenuation, peribronchovascular consolidation in more dense areas of mass-like consolidation, indicative of progressive multilobar bilateral pneumonia. Trace bilateral pleural effusions are also noted. Upper Abdomen: Unremarkable. Musculoskeletal: There are no aggressive appearing lytic or blastic lesions noted in the visualized portions of the skeleton. Review of the MIP images confirms the above findings. IMPRESSION: 1. No evidence of pulmonary embolism. 2. Progressive multilobar bilateral pneumonia, most severe in the lower lobes of the lungs bilaterally. 3. Trace bilateral parapneumonic pleural effusions. 4. Cardiomegaly with left ventricular dilatation. Electronically Signed   By: Trudie Reed M.D.   On: 06/04/2023 05:46   DG Chest Portable 1 View  Result Date: 06/04/2023 CLINICAL DATA:  52 year old male with signs and symptoms of pneumonia. EXAM: PORTABLE CHEST 1 VIEW COMPARISON:  Chest x-ray 05/30/2023. FINDINGS: Lung volumes are slightly low. Extensive opacities throughout the mid to lower lungs bilaterally indicative of areas of atelectasis and/or consolidation, with superimposed small bilateral pleural effusions. No pneumothorax. No evidence of pulmonary edema. Heart size is mildly enlarged. The patient is rotated to the left on today's exam, resulting in distortion of the mediastinal contours and reduced diagnostic sensitivity and specificity for mediastinal pathology. IMPRESSION: 1. Low lung volumes with patchy areas of atelectasis and/or consolidation throughout  the mid to lower lungs bilaterally and superimposed small bilateral pleural effusions. Electronically Signed   By: Trudie Reed M.D.   On: 06/04/2023 05:42   CT CHEST WO CONTRAST  Result Date: 05/30/2023 CLINICAL DATA:  Pneumonia, complication suspected, xray done Respiratory illness, nondiagnostic xray EXAM: CT CHEST WITHOUT CONTRAST TECHNIQUE: Multidetector CT imaging of the chest was performed following the standard protocol without IV contrast. RADIATION DOSE REDUCTION: This exam was performed according to the departmental dose-optimization program which includes automated exposure control, adjustment of the mA and/or kV according to patient size and/or use of iterative reconstruction technique. COMPARISON:  Radiograph earlier today. FINDINGS: Cardiovascular: Prominent cardiomegaly. No pericardial effusion. The thoracic aorta is normal in caliber. Mediastinum/Nodes: Shotty mediastinal lymph nodes, including 10 mm short axis anterior paratracheal node series 3, image 21. Prevascular node measures 11 mm series 3, image 23. Hilar assessment is limited in the absence of IV contrast. No esophageal wall thickening. Lungs/Pleura: Multifocal bilateral lower lobe consolidations, peripheral with ground-glass and consolidative components. Small air bronchograms in the anterior right lower lobe consolidation. Bandlike areas of subsegmental atelectasis or scarring within the anterior right middle lobe. Heterogeneous pulmonary parenchyma with mild apical emphysema. There are small bilateral pleural effusions, partially loculated on the left. Upper Abdomen: Hepatic steatosis. No acute findings in the included upper abdomen. Musculoskeletal: There is generalized body wall edema. Scoliosis and degenerative change in the spine. There are no acute or suspicious osseous abnormalities. IMPRESSION: 1. Multifocal bilateral lower lobe consolidations, peripheral with ground-glass and consolidative components. Findings are  suspicious for multifocal pneumonia. Appearance can be  seen in the setting of septic emboli. Recommend radiologic follow-up to resolution. 2. Small bilateral pleural effusions, partially loculated on the left. 3. Prominent cardiomegaly. 4. Shotty mediastinal lymph nodes, likely reactive. 5. Hepatic steatosis. 6. Generalized body wall edema. Emphysema (ICD10-J43.9). Electronically Signed   By: Narda Rutherford M.D.   On: 05/30/2023 22:36   DG Chest 2 View  Result Date: 05/30/2023 CLINICAL DATA:  Pneumonia EXAM: CHEST - 2 VIEW COMPARISON:  05/25/2023 FINDINGS: Frontal and lateral views of the chest demonstrates stable enlargement of the cardiac silhouette. Persistent consolidation at the lung bases. Stable diffuse interstitial prominence. No effusion or pneumothorax. No acute bony abnormality. IMPRESSION: 1. Continued bibasilar airspace disease, consistent with given history of pneumonia. Given the persistence of the left lower lobe consolidation since 12/02/2022, follow-up CT may be useful as previously recommended. Electronically Signed   By: Sharlet Salina M.D.   On: 05/30/2023 14:35   VAS [place] LOWER EXTREMITY VENOUS (DVT)  Result Date: 05/27/2023  Lower Venous DVT Study Patient Name:  KENZI ZAMBO  Date of Exam:   05/27/2023 Medical Rec #: [id number]           Accession #:    [id number] Date of Birth: 27-Feb-1971            Patient Gender: M Patient Age:   44 years Exam Location:  [facility] Procedure:      VAS [place] LOWER EXTREMITY VENOUS (DVT) Referring Phys: [facility] GOEL --------------------------------------------------------------------------------  Indications: Swelling, and Edema.  Limitations: Patient involuntary movement. Comparison Study: Previous exam on 2.25.2023 Performing Technologist: Fernande Bras  Examination Guidelines: A complete evaluation includes B-mode imaging, spectral Doppler, color Doppler, and power Doppler as needed of all accessible portions of each vessel. Bilateral  testing is considered an integral part of a complete examination. Limited examinations for reoccurring indications may be performed as noted. The reflux portion of the exam is performed with the patient in reverse Trendelenburg.  +---------+---------------+---------+-----------+----------+--------------+ RIGHT    CompressibilityPhasicitySpontaneityPropertiesThrombus Aging +---------+---------------+---------+-----------+----------+--------------+ CFV      Full           Yes      Yes                                 +---------+---------------+---------+-----------+----------+--------------+ SFJ      Full                                                        +---------+---------------+---------+-----------+----------+--------------+ FV Prox  Full                                                        +---------+---------------+---------+-----------+----------+--------------+ FV Mid   Full                                                        +---------+---------------+---------+-----------+----------+--------------+ FV DistalFull                                                        +---------+---------------+---------+-----------+----------+--------------+  PFV      Full                                                        +---------+---------------+---------+-----------+----------+--------------+ POP      Full           Yes      Yes                                 +---------+---------------+---------+-----------+----------+--------------+ PTV      Full                                                        +---------+---------------+---------+-----------+----------+--------------+ PERO     Full                                                        +---------+---------------+---------+-----------+----------+--------------+   +---------+---------------+---------+-----------+----------+--------------+ LEFT      CompressibilityPhasicitySpontaneityPropertiesThrombus Aging +---------+---------------+---------+-----------+----------+--------------+ CFV      Full           Yes      Yes                                 +---------+---------------+---------+-----------+----------+--------------+ SFJ      Full                                                        +---------+---------------+---------+-----------+----------+--------------+ FV Prox  Full                                                        +---------+---------------+---------+-----------+----------+--------------+ FV Mid   Full                                                        +---------+---------------+---------+-----------+----------+--------------+ FV DistalFull                                                        +---------+---------------+---------+-----------+----------+--------------+ PFV      Full                                                        +---------+---------------+---------+-----------+----------+--------------+  POP      Full           Yes      Yes                                 +---------+---------------+---------+-----------+----------+--------------+ PTV      Full                                                        +---------+---------------+---------+-----------+----------+--------------+ PERO     Full                                                        +---------+---------------+---------+-----------+----------+--------------+     Summary: BILATERAL: - No evidence of deep vein thrombosis seen in the lower extremities, bilaterally. -No evidence of popliteal cyst, bilaterally.   *See table(s) above for measurements and observations. Electronically signed by Sherald Hess MD on 05/27/2023 at 3:05:22 PM.    Final    VAS US CAROTID  Result Date: 05/27/2023 Carotid Arterial Duplex Study Patient Name:  EMILEO SCHADLER  Date of Exam:   05/27/2023 Medical  Rec #: [id number]           Accession #:    [id number] Date of Birth: 1970/09/16            Patient Gender: M Patient Age:   89 years Exam Location:  [facility] Procedure:      VAS US CAROTID Referring Phys: Lorin Glass --------------------------------------------------------------------------------  Indications:  CVA. Risk Factors: Prior MI. Limitations   Today's exam was limited due to Patient involuntary movement. Performing Technologist: Fernande Bras  Examination Guidelines: A complete evaluation includes B-mode imaging, spectral Doppler, color Doppler, and power Doppler as needed of all accessible portions of each vessel. Bilateral testing is considered an integral part of a complete examination. Limited examinations for reoccurring indications may be performed as noted.  Right Carotid Findings: +----------+--------+--------+--------+------------------+------------------+           PSV cm/sEDV cm/sStenosisPlaque DescriptionComments           +----------+--------+--------+--------+------------------+------------------+ CCA Prox  75      17                                                   +----------+--------+--------+--------+------------------+------------------+ CCA Distal67      30                                                   +----------+--------+--------+--------+------------------+------------------+ ICA Prox  61      21                                intimal thickening +----------+--------+--------+--------+------------------+------------------+ ICA Mid   56      28                                                   +----------+--------+--------+--------+------------------+------------------+  ICA Distal62      21                                                   +----------+--------+--------+--------+------------------+------------------+ ECA       79      19                                                    +----------+--------+--------+--------+------------------+------------------+ +----------+--------+-------+--------+-------------------+           PSV cm/sEDV cmsDescribeArm Pressure (mmHG) +----------+--------+-------+--------+-------------------+ Subclavian89      17                                 +----------+--------+-------+--------+-------------------+ +---------+--------+--+--------+--+ VertebralPSV cm/s50EDV cm/s19 +---------+--------+--+--------+--+  Left Carotid Findings: +----------+--------+--------+--------+------------------+------------------+           PSV cm/sEDV cm/sStenosisPlaque DescriptionComments           +----------+--------+--------+--------+------------------+------------------+ CCA Prox  90      31                                                   +----------+--------+--------+--------+------------------+------------------+ CCA Distal67      22                                                   +----------+--------+--------+--------+------------------+------------------+ ICA Prox  47      21                                intimal thickening +----------+--------+--------+--------+------------------+------------------+ ICA Mid   64      26                                                   +----------+--------+--------+--------+------------------+------------------+ ICA Distal69      34                                                   +----------+--------+--------+--------+------------------+------------------+ ECA       65      17                                                   +----------+--------+--------+--------+------------------+------------------+ +----------+--------+--------+--------+-------------------+           PSV cm/sEDV cm/sDescribeArm Pressure (mmHG) +----------+--------+--------+--------+-------------------+ [id number]      23                                   +----------+--------+--------+--------+-------------------+ +---------+--------+--+--------+--+  VertebralPSV cm/s59EDV cm/s27 +---------+--------+--+--------+--+   Summary: Right Carotid: The ECA appears <50% stenosed. The extracranial vessels were                near-normal with only minimal wall thickening or plaque. Left Carotid: The ECA appears <50% stenosed. The extracranial vessels were               near-normal with only minimal wall thickening or plaque. Vertebrals:  Bilateral vertebral arteries demonstrate antegrade flow. Subclavians: Normal flow hemodynamics were seen in bilateral subclavian              arteries. *See table(s) above for measurements and observations.  Electronically signed by Sherald Hess MD on 05/27/2023 at 3:05:03 PM.    Final    ECHOCARDIOGRAM COMPLETE BUBBLE STUDY  Result Date: 05/27/2023    ECHOCARDIOGRAM REPORT   Patient Name:   JAVONTEZ BACANI Date of Exam: 05/27/2023 Medical Rec #:  [id number]          Height:       67.0 in Accession #:    [id number]         Weight:       151.9 lb Date of Birth:  [birth year]           BSA:          1.799 m Patient Age:    52 years           BP:           119/87 mmHg Patient Gender: M                  HR:           105 bpm. Exam Location:  Inpatient Procedure: 2D Echo, Cardiac Doppler, Color Doppler and Intracardiac            Opacification Agent Indications:    Stroke  History:        Patient has prior history of Echocardiogram examinations, most                 recent 11/29/2022. CHF, CAD and Previous Myocardial Infarction;                 Stroke.  Sonographer:    Darlys Gales Referring Phys: [id number] [facility]  Sonographer Comments: Patient had previous negative bubble study. IMPRESSIONS  1. Left ventricular ejection fraction, by estimation, is <20%. The left ventricle has severely decreased function. The left ventricle demonstrates global hypokinesis. The left ventricular internal cavity size was moderately dilated. Left  ventricular diastolic parameters are consistent with Grade II diastolic dysfunction (pseudonormalization).  2. Right ventricular systolic function is at least moderately reduced. The right ventricular size is mildly enlarged.  3. Left atrial size is moderate to severely dilated.  4. The mitral valve is thickened with limited movement of the valve leaflets (cannot r/o rheumatic mitral valve stenosis). There is a mean gradient of consistent with severe mitral stenosis.  5. Tricuspid valve regurgitation is moderate to severe.  6. The aortic valve is normal in structure. Aortic valve regurgitation is not visualized. FINDINGS  Left Ventricle: Left ventricular ejection fraction, by estimation, is <20%. The left ventricle has severely decreased function. The left ventricle demonstrates global hypokinesis. Definity contrast agent was given IV to delineate the left ventricular endocardial borders. The left ventricular internal cavity size was moderately dilated. There is no left ventricular hypertrophy. Left ventricular diastolic parameters are consistent with Grade II diastolic dysfunction (pseudonormalization). Right Ventricle:  The right ventricular size is mildly enlarged. No increase in right ventricular wall thickness. Right ventricular systolic function is moderately reduced. Left Atrium: Left atrial size was moderately dilated. Right Atrium: Right atrial size was normal in size. Pericardium: There is no evidence of pericardial effusion. Mitral Valve: The mitral valve is abnormal. Moderate mitral valve regurgitation. Moderate to severe mitral valve stenosis. MV peak gradient, 19.2 mmHg. The mean mitral valve gradient is 10.7 mmHg. Tricuspid Valve: The tricuspid valve is normal in structure. Tricuspid valve regurgitation is moderate to severe. Aortic Valve: The aortic valve is normal in structure. Aortic valve regurgitation is not visualized. Aortic valve mean gradient measures 4.0 mmHg. Aortic valve peak  gradient measures 5.8 mmHg. Aortic valve area, by VTI measures 2.93 cm. Pulmonic Valve: The pulmonic valve was not well visualized. Pulmonic valve regurgitation is not visualized. Aorta: The aortic root is normal in size and structure. IAS/Shunts: No atrial level shunt detected by color flow Doppler.  LEFT VENTRICLE PLAX 2D LVIDd:         5.20 cm LVIDs:         4.90 cm LV PW:         1.00 cm LV IVS:        1.00 cm LVOT diam:     1.90 cm LV SV:         42 LV SV Index:   23 LVOT Area:     2.84 cm  RIGHT VENTRICLE RV S prime:     8.16 cm/s LEFT ATRIUM              Index        RIGHT ATRIUM           Index LA Vol (A2C):   109.0 ml 60.59 ml/m  RA Area:     20.50 cm LA Vol (A4C):   68.4 ml  38.02 ml/m  RA Volume:   58.70 ml  32.63 ml/m LA Biplane Vol: 91.0 ml  50.58 ml/m  AORTIC VALVE AV Area (Vmax):    2.39 cm AV Area (Vmean):   2.43 cm AV Area (VTI):     2.93 cm AV Vmax:           120.00 cm/s AV Vmean:          93.400 cm/s AV VTI:            0.143 m AV Peak Grad:      5.8 mmHg AV Mean Grad:      4.0 mmHg LVOT Vmax:         101.00 cm/s LVOT Vmean:        80.100 cm/s LVOT VTI:          0.148 m LVOT/AV VTI ratio: 1.03  AORTA Ao Root diam: 2.60 cm MITRAL VALVE                TRICUSPID VALVE MV Area (PHT): 2.24 cm     TR Peak grad:   51.3 mmHg MV Area VTI:   1.21 cm     TR Vmax:        358.00 cm/s MV Peak grad:  19.2 mmHg MV Mean grad:  10.7 mmHg    SHUNTS MV Vmax:       2.19 m/s     Systemic VTI:  0.15 m MV Vmean:      158.3 cm/s   Systemic Diam: 1.90 cm MV Decel Time: 338 msec MV E velocity: 170.00 cm/s MV A velocity: 152.00 cm/s  MV E/A ratio:  1.12 Aditya Sabharwal Electronically signed by Dorthula Nettles Signature Date/Time: 05/27/2023/9:47:42 AM    Final    MR Lumbar Spine W Wo Contrast  Result Date: 05/26/2023 CLINICAL DATA:  Back pain, history of drug use. Concern for discitis/osteomyelitis, abscess EXAM: MRI LUMBAR SPINE WITHOUT AND WITH CONTRAST TECHNIQUE: Multiplanar and multiecho pulse sequences of  the lumbar spine were obtained without and with intravenous contrast. CONTRAST:  7mL GADAVIST GADOBUTROL 1 MMOL/ML IV SOLN COMPARISON:  Lumbar spine MRI 03/28/2022 FINDINGS: Segmentation: Standard; the lowest formed disc space is designated L5-S1. Alignment:  Normal. Vertebrae: Vertebral body heights are preserved. Background marrow signal is normal. There is no suspicious marrow signal abnormality or marrow edema. There is no evidence of discitis/osteomyelitis. Conus medullaris and cauda equina: Conus extends to the L1 level. Conus and cauda equina appear normal. Paraspinal and other soft tissues: Unremarkable. Disc levels: There is mild disc desiccation at L3-L4 and L4-L5 with mild bulges and mild facet arthropathy at these levels. The other disc heights are preserved. There is no significant spinal canal or neural foraminal stenosis at any level. IMPRESSION: No evidence of discitis/osteomyelitis or significant degenerative change in the lumbar spine. Electronically Signed   By: Lesia Hausen M.D.   On: 05/26/2023 16:45   MR BRAIN WO CONTRAST  Result Date: 05/26/2023 CLINICAL DATA:  Neuro deficit, stroke suspected EXAM: MRI HEAD WITHOUT CONTRAST TECHNIQUE: Multiplanar, multiecho pulse sequences of the brain and surrounding structures were obtained without intravenous contrast. COMPARISON:  CT head 1 day prior FINDINGS: Brain: There are punctate foci of cortical diffusion restriction in the bilateral frontal lobes (5-80, 7-60) suspicious for tiny acute infarcts. There is no acute intracranial hemorrhage or extra-axial fluid collection There is age accelerated parenchymal volume loss with prominence of the ventricular system and extra-axial CSF spaces. There is multifocal encephalomalacia in the bilateral frontal, parietal, and occipital lobes consistent with prior infarcts. There are additional small remote infarcts in the right caudate and left cerebellar hemisphere. There are associated curvilinear chronic  blood products overlying the left cerebral hemisphere as well as a punctate chronic microhemorrhage in the brainstem, nonspecific. The pituitary and suprasellar region are normal. 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 There is no other mass lesion. There is no mass effect or midline shift. Vascular: Normal flow voids. Skull and upper cervical spine: Normal marrow signal. Sinuses/Orbits: The paranasal sinuses are clear. The globes and orbits are unremarkable. Other: The mastoid air cells and middle ear cavities are clear. IMPRESSION: 1. Two possible tiny acute cortical infarcts in the bilateral frontal lobes. 2. No other evidence of acute intracranial pathology. The left frontoparietal infarct described on the CT head from 1 day prior is remote. 3. Numerous additional remote infarcts and age accelerated parenchymal volume loss as above. 4. Unchanged left vestibular schwannoma. Electronically Signed   By: Lesia Hausen M.D.   On: 05/26/2023 16:40   CT Head Wo Contrast  Result Date: 05/25/2023 CLINICAL DATA:  Neuro deficit, acute, stroke suspected Mental status change, unknown cause right arm paresthesia EXAM: CT HEAD WITHOUT CONTRAST TECHNIQUE: Contiguous axial images were obtained from the base of the skull through the vertex without intravenous contrast. RADIATION DOSE REDUCTION: This exam was performed according to the departmental dose-optimization program which includes automated exposure control, adjustment of the mA and/or kV according to patient size and/or use of iterative reconstruction technique. COMPARISON:  CT head 10/03/2021 FINDINGS: Brain: Cerebral ventricle sizes are concordant with the  degree of cerebral volume loss. Interval development of loss of left frontal gray-white matter differentiation along the precentral gyrus and anteriorly suggestive of acute infarction. Finding extends minimally to the postcentral gyrus. No parenchymal hemorrhage. No mass  lesion. No extra-axial collection. No mass effect or midline shift. No hydrocephalus. Basilar cisterns are patent. Mega cisterna magna. Vascular: No hyperdense vessel. Skull: No acute fracture or focal lesion. Sinuses/Orbits: Paranasal sinuses and mastoid air cells are clear. The orbits are unremarkable. Other: None. IMPRESSION: 1. Acute left frontoparietal infarction. Recommend MRI noncontrast for further evaluation 2. No acute intracranial hemorrhage. These results were called by telephone at the time of interpretation on 05/25/2023 at 3:58 pm to provider Rexford Maus, DO, who verbally acknowledged these results. Electronically Signed   By: Tish Frederickson M.D.   On: 05/25/2023 15:58   DG Chest 2 View  Result Date: 05/25/2023 CLINICAL DATA:  Chest pain EXAM: CHEST - 2 VIEW COMPARISON:  Chest x-ray dated Dec 02, 2022 FINDINGS: Unchanged cardiomegaly. New consolidation of the anterior right lower lobe. Left lower lobe consolidation, also seen on prior exam. New mild diffuse interstitial opacities. No evidence of pneumothorax. No pleural effusion. IMPRESSION: 1. New consolidation of the anterior right lower lobe, concerning for infection or aspiration. 2. Left lower lobe consolidation. Further evaluation with chest CT recommended given lack of resolution when compared with the prior. 3. New mild diffuse interstitial opacities which could be due to a component of pulmonary edema. 4. Cardiomegaly. Electronically Signed   By: Allegra Lai M.D.   On: 05/25/2023 15:18    Microbiology: Results for orders placed or performed during the hospital encounter of 06/10/23  SARS Coronavirus 2 by RT PCR (hospital order, performed in [facility] hospital lab) *cepheid single result test* Anterior Nasal Swab     Status: None   Collection Time: 06/11/23 12:39 AM   Specimen: Anterior Nasal Swab  Result Value Ref Range Status   SARS Coronavirus 2 by RT PCR NEGATIVE NEGATIVE Final    Comment: (NOTE) SARS-CoV-2  target nucleic acids are NOT DETECTED.  The SARS-CoV-2 RNA is generally detectable in upper and lower respiratory specimens during the acute phase of infection. The lowest concentration of SARS-CoV-2 viral copies this assay can detect is 250 copies / mL. A negative result does not preclude SARS-CoV-2 infection and should not be used as the sole basis for treatment or other patient management decisions.  A negative result may occur with improper specimen collection / handling, submission of specimen other than nasopharyngeal swab, presence of viral mutation(s) within the areas targeted by this assay, and inadequate number of viral copies (<250 copies / mL). A negative result must be combined with clinical observations, patient history, and epidemiological information.  Fact Sheet for Patients:   RoadLapTop.co.za  Fact Sheet for Healthcare Providers: http://kim-miller.com/  This test is not yet approved or  cleared by the [place] FDA and has been authorized for detection and/or diagnosis of SARS-CoV-2 by FDA under an Emergency Use Authorization (EUA).  This EUA will remain in effect (meaning this test can be used) for the duration of the COVID-19 declaration under Section 564(b)(1) of the Act, 21 U.S.C. section 360bbb-3(b)(1), unless the authorization is terminated or revoked sooner.  Performed at [facility], [address] [place], [place] [address]   Respiratory (~20 pathogens) panel by PCR     Status: None   Collection Time: 06/12/23  4:26 PM   Specimen: Nasopharyngeal Swab; Respiratory  Result Value Ref  Range Status   Adenovirus NOT DETECTED NOT DETECTED Final   Coronavirus 229E NOT DETECTED NOT DETECTED Final    Comment: (NOTE) The Coronavirus on the Respiratory Panel, DOES NOT test for the novel  Coronavirus (2019 nCoV)    Coronavirus HKU1 NOT DETECTED NOT DETECTED Final   Coronavirus NL63 NOT  DETECTED NOT DETECTED Final   Coronavirus OC43 NOT DETECTED NOT DETECTED Final   Metapneumovirus NOT DETECTED NOT DETECTED Final   Rhinovirus / Enterovirus NOT DETECTED NOT DETECTED Final   Influenza A NOT DETECTED NOT DETECTED Final   Influenza B NOT DETECTED NOT DETECTED Final   Parainfluenza Virus 1 NOT DETECTED NOT DETECTED Final   Parainfluenza Virus 2 NOT DETECTED NOT DETECTED Final   Parainfluenza Virus 3 NOT DETECTED NOT DETECTED Final   Parainfluenza Virus 4 NOT DETECTED NOT DETECTED Final   Respiratory Syncytial Virus NOT DETECTED NOT DETECTED Final   Bordetella pertussis NOT DETECTED NOT DETECTED Final   Bordetella Parapertussis NOT DETECTED NOT DETECTED Final   Chlamydophila pneumoniae NOT DETECTED NOT DETECTED Final   Mycoplasma pneumoniae NOT DETECTED NOT DETECTED Final    Comment: Performed at [facility], [address] [place], [place] [address]    Labs: CBC: Recent Labs  Lab 06/07/23 0515 06/08/23 0505 06/10/23 2321 06/11/23 1010 06/12/23 0617 06/13/23 1210  WBC 14.2* 10.6* 12.4* 12.8* 20.1* 13.8*  NEUTROABS 11.6* 8.4* 9.4* 10.0*  --   --   HGB 8.7* 8.6* 11.5* 9.6* 10.0* 10.5*  HCT 26.4* 27.1* 37.1* 30.0* 31.8* 32.5*  MCV 91.7 90.9 94.6 92.6 94.1 92.6  PLT 518* 600* 540* 646* 681* 650*   Basic Metabolic Panel: Recent Labs  Lab 06/10/23 2321 06/11/23 1010 06/12/23 0617 06/12/23 1655 06/13/23 1210  NA 133* 135 133* 137 136  K 5.2* 4.6 4.6 4.2 4.4  CL 100 100 99 102 103  CO2 24 25 26 27 25   GLUCOSE 87 116* 145* 130* 101*  BUN 14 17 26* 32* 30*  CREATININE 1.16 1.27* 1.47*  1.52* 1.67* 1.21  CALCIUM 9.0 8.5* 8.6* 8.6* 8.7*  MG  --  2.0  --   --   --   PHOS  --  3.5  --   --   --    Liver Function Tests: Recent Labs  Lab 06/10/23 2321 06/11/23 1010  AST 41 25  ALT 16 13  ALKPHOS 134* 112  BILITOT 0.9 0.5  PROT 8.1 6.7  ALBUMIN 2.6* 2.1*    Discharge time spent: 35 minutes.  Signed: Jacquelin Hawking, MD [facility] 06/13/2023

## 2023-06-13 NOTE — TOC Initial Note (Addendum)
Transition of Care [facility]) - Initial/Assessment Note    Patient Details  Name: Craig Schmidt MRN: [id number] Date of Birth: 27-Jun-1971  [facility] CM/SW Contact:    Larrie Kass, LCSW Phone Number: 06/13/2023, 10:00 AM  Clinical Narrative:                 Pt rec for PT/OT eval , TOC to follow.   ADDEN 2:00pm CSW received a message from PT concerning pt's rollator. CSW and [name] supervisor met with pt to educate him on how to use the break on DME. Pt provided verbal understanding on how to use rollator.  CSW spoke with pt regarding homeless consult. Pt report waiting on apartment that his case worker is helping him to get. CSW suggested pt to speak with the [facility] to get assistance in temporary shelter. Pt stated he does not want to go to [facility] , as he states "2 people were killed there." CSW informed pt she can only provided shelter resources and he would have to follow up on his own. CSW suggested pt contact [employer] for assistance. Pt reports he only goes there for a meal. CSW provided pt with food panty list and list to receive a free meal. Pt was given taxi voucher for transportation at d/c   Barriers to Discharge: Continued Medical Work up   Patient Goals and CMS Choice            Expected Discharge Plan and Services                                              Prior Living Arrangements/Services                       Activities of Daily Living   ADL Screening (condition at time of admission) Independently performs ADLs?: No Does the patient have a NEW difficulty with bathing/dressing/toileting/self-feeding that is expected to last >3 days?: No Does the patient have a NEW difficulty with getting in/out of bed, walking, or climbing stairs that is expected to last >3 days?: No Does the patient have a NEW difficulty with communication that is expected to last >3 days?: No Is the patient deaf or have difficulty hearing?:  No Does the patient have difficulty seeing, even when wearing glasses/contacts?: No Does the patient have difficulty concentrating, remembering, or making decisions?: No  Permission Sought/Granted                  Emotional Assessment              Admission diagnosis:  Elevated troponin [R79.89] Multifocal pneumonia [J18.9] Chest pain, unspecified type [R07.9] Patient Active Problem List   Diagnosis Date Noted   Multifocal pneumonia 06/11/2023   Hyperkalemia 06/11/2023   Hyponatremia 06/11/2023   Normocytic anemia 06/11/2023   Thrombocytosis 06/11/2023   Troponin I above reference range 05/25/2023   CAP (community acquired pneumonia) 05/25/2023   Pneumonia 05/25/2023   Acute on chronic combined systolic and diastolic heart failure (HCC) 12/03/2022   Malnutrition of moderate degree 11/30/2022   Acute on chronic combined systolic and diastolic CHF (congestive heart failure) (HCC) 11/29/2022   Acute on chronic combined systolic (congestive) and diastolic (congestive) heart failure (HCC) 11/28/2022   ACS (acute coronary syndrome) (HCC) 11/28/2022   Back pain 11/28/2022   Essential hypertension 11/28/2022  Dyslipidemia 11/28/2022   Noncompliance with medication regimen 11/28/2022   ST elevation myocardial infarction (STEMI) of inferolateral wall, subsequent episode of care (HCC) 12/19/2021   ST elevation myocardial infarction (STEMI) (HCC)    Ischemic cardiomyopathy    Cocaine abuse (HCC)    Smoker    Alcohol abuse    CVA (cerebral vascular accident) (HCC) 09/08/2021   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 [place] [address] Phone: [phone] Fax: [phone]  [id number] [place], [place] - [address] [place] [address] Phone: [phone] Fax: [phone]  [facility] [place], [place] - [address] [place] [address] Phone: [phone] Fax: [phone]  [place] [facility] [address] [place] [address] Phone: [phone] Fax: [phone]     Social Determinants of Health (SDOH) Social History: SDOH Screenings   Food Insecurity: Food Insecurity Present (06/11/2023)  Housing: High Risk (06/11/2023)  Transportation Needs: Unmet Transportation Needs (06/11/2023)  Utilities: At Risk (06/11/2023)  Alcohol Screen: High Risk (09/19/2021)  Depression (PHQ2-9): High Risk (06/13/2022)  Financial Resource Strain: High Risk (09/07/2022)  Stress: Stress Concern Present (10/16/2022)  Tobacco Use: High Risk (06/11/2023)   SDOH Interventions:     Readmission Risk Interventions     No data to display

## 2023-06-13 NOTE — Discharge Instructions (Signed)
Craig Schmidt,  You were in the hospital because of breathing difficulties. There was initial concern for pneumonia, but this issue appears to have been related to your heart failure. You have significantly improved with Lasix. Your x-ray shows some residual evidence of your prior pneumonia, but it is improving. You will no longer require antibiotics. Your cough may linger for several weeks; please follow-up with your primary care physician in the next week for a hospital follow-up.

## 2023-06-13 NOTE — Evaluation (Signed)
Physical Therapy Evaluation Patient Details Name: Craig Schmidt MRN: [id number] DOB: 11/08/1970 Today's Date: 06/13/2023  History of Present Illness  Pt is a 52 y.o. male admitted for multifocal pneumonia & SOB. Recent hospitalization leaving AMA on 11/08, readmitted on 11/12 and discharged 11/18. PMH significant for homelessness, CVA with residual R sided hemiparesis, cocaine abuse, alcohol abuse, medication non-adherence, CAD, cardiomyopathy, NSTEMI, HFrEF, sepsis, hyperlipidemia, CKD.  Clinical Impression  Pt admitted with above diagnosis.  Pt currently with functional limitations due to the deficits listed below (see PT Problem List). Pt will benefit from acute skilled PT to increase their independence and safety with mobility to allow discharge.  Pt agreeable to mobilize as tolerated.  Pt ambulated in hallway utilizing new rollator in room.   Pt's rollator now varies from his previous rollator (see mobility section below, also contacted TOC team).   Pt seems to be close to his baseline. No f/u physical therapy d/c recommendations at this time.         If plan is discharge home, recommend the following: Assistance with cooking/housework;Assist for transportation;Help with stairs or ramp for entrance;A little help with walking and/or transfers;A little help with bathing/dressing/bathroom   Can travel by private vehicle        Equipment Recommendations Other (comment) (rollator delivered in pt's room, would be safer to have rollator model where brakes push down to lock without having to maintain grip)  Recommendations for Other Services       Functional Status Assessment Patient has had a recent decline in their functional status and demonstrates the ability to make significant improvements in function in a reasonable and predictable amount of time.     Precautions / Restrictions Precautions Precautions: Fall Precaution Comments: R hemiparesis      Mobility  Bed  Mobility Overal bed mobility: Needs Assistance Bed Mobility: Supine to Sit, Sit to Supine     Supine to sit: Supervision, HOB elevated Sit to supine: Supervision, HOB elevated        Transfers Overall transfer level: Needs assistance Equipment used: Rollator (4 wheels) Transfers: Sit to/from Stand Sit to Stand: Contact guard assist           General transfer comment: pt wished to use rollator this visit, pt typically has brakes which lock down at handhold however rollator delivered to room requires active grip to utilize brake (pt not able to grip to use brake on R UE due to hx of CVA deficits)    Ambulation/Gait Ambulation/Gait assistance: Contact guard assist, Supervision Gait Distance (Feet): 160 Feet Assistive device: Rollator (4 wheels) Gait Pattern/deviations: Trunk flexed, Step-through pattern, Decreased stride length Gait velocity: decreased     General Gait Details: pt was ble to push rollator with L UE however unable to safely lock brakes with grip (only able to use in the one hand) to sit down on stable rollator, did require seated rest break due to fatigue; otherwise gait is likley at pt's baseline (forward pelvis with rounded thoracic spine, slow, small steps), no overt LOB observed  Stairs            Wheelchair Mobility     Tilt Bed    Modified Rankin (Stroke Patients Only)       Balance Overall balance assessment: Needs assistance           Standing balance-Leahy Scale: Fair Standing balance comment: static standing fair  Pertinent Vitals/Pain Pain Assessment Pain Assessment: Faces Faces Pain Scale: Hurts even more Pain Location: back Pain Descriptors / Indicators: Aching, Sore Pain Intervention(s): Repositioned, Premedicated before session, Monitored during session    Home Living Family/patient expects to be discharged to:: Shelter/Homeless   Available Help at Discharge: Other  (Comment);Available PRN/intermittently (girlfriend) Type of Home: Homeless                  Prior Function Prior Level of Function : Independent/Modified Independent (from recent admission)             Mobility Comments: pt reports using cane and rollator for mobility, but reports rollator is broken. Pt reports his girlfriend is available intermittently ADLs Comments: reports having assist from girlfriend for dressing tasks     Extremity/Trunk Assessment   Upper Extremity Assessment Upper Extremity Assessment: Defer to OT evaluation    Lower Extremity Assessment Lower Extremity Assessment: Generalized weakness RLE Deficits / Details: hx right sided residual deficits from previous CVA (pt reports more affected UE then LE)    Cervical / Trunk Assessment Cervical / Trunk Assessment: Kyphotic  Communication   Communication Communication: No apparent difficulties  Cognition Arousal: Alert Behavior During Therapy: Flat affect Overall Cognitive Status: Within Functional Limits for tasks assessed                                          General Comments      Exercises     Assessment/Plan    PT Assessment Patient needs continued PT services  PT Problem List Decreased strength;Decreased balance;Decreased activity tolerance;Decreased mobility;Decreased knowledge of use of DME;Cardiopulmonary status limiting activity       PT Treatment Interventions DME instruction;Gait training;Stair training;Functional mobility training;Therapeutic activities;Balance training;Neuromuscular re-education;Patient/family education;Therapeutic exercise    PT Goals (Current goals can be found in the Care Plan section)  Acute Rehab PT Goals PT Goal Formulation: With patient Time For Goal Achievement: 06/20/23 Potential to Achieve Goals: Fair    Frequency Min 1X/week     Co-evaluation               AM-PAC PT "6 Clicks" Mobility  Outcome Measure Help needed  turning from your back to your side while in a flat bed without using bedrails?: A Little Help needed moving from lying on your back to sitting on the side of a flat bed without using bedrails?: A Little Help needed moving to and from a bed to a chair (including a wheelchair)?: A Little Help needed standing up from a chair using your arms (e.g., wheelchair or bedside chair)?: A Little Help needed to walk in hospital room?: A Little Help needed climbing 3-5 steps with a railing? : A Lot 6 Click Score: 17    End of Session Equipment Utilized During Treatment: Gait belt Activity Tolerance: Patient limited by fatigue Patient left: in bed;with call bell/phone within reach Nurse Communication: Mobility status PT Visit Diagnosis: Difficulty in walking, not elsewhere classified (R26.2);Muscle weakness (generalized) (M62.81)    Time: 2130-8657 PT Time Calculation (min) (ACUTE ONLY): 22 min   Charges:   PT Evaluation $PT Eval Low Complexity: 1 Low   PT General Charges $$ ACUTE PT VISIT: 1 Visit        Thomasene Mohair PT, DPT Physical Therapist Acute Rehabilitation Services Office: [phone]   Janan Halter Payson 06/13/2023, 2:43 PM

## 2023-06-13 NOTE — Evaluation (Signed)
Occupational Therapy Evaluation Patient Details Name: Craig Schmidt MRN: [id number] DOB: 06/01/1971 Today's Date: 06/13/2023   History of Present Illness Pt is a 52 y.o. male admitted for multifocal pneumonia & SOB. Recent hospitalization leaving AMA on 11/08, readmitted on 11/12 and discharged 11/18. PMH significant for homelessness, CVA with residual R sided hemiparesis, cocaine abuse, alcohol abuse, medication non-adherence, CAD, cardiomyopathy, NSTEMI, HFrEF, sepsis, hyperlipidemia, CKD.   Clinical Impression   Prior to hospital admission, pt was homeless with recent hospitalizations. Pt requires assist at baseline for ADLs from girlfriend, uses [facility] or rollator for mobility. Chronic R hemiparesis from prior CVA limits functional use of dominant RUE. Pt currently requires min A for ADLs, and supervision/CGA for toileting tasks. Pt would benefit from skilled OT services to address noted impairments and functional limitations (see below for any additional details) in order to maximize safety and independence while minimizing falls risk and caregiver burden. Do not anticipate the need for follow up OT services upon acute hospital DC.       If plan is discharge home, recommend the following: A little help with walking and/or transfers;A lot of help with bathing/dressing/bathroom;Assistance with cooking/housework;Assistance with feeding;Assist for transportation;Help with stairs or ramp for entrance    Functional Status Assessment  Patient has not had a recent decline in their functional status  Equipment Recommendations  None recommended by OT       Precautions / Restrictions Precautions Precautions: Fall Precaution Comments: R hemiparesis      Mobility Bed Mobility Overal bed mobility: Needs Assistance Bed Mobility: Supine to Sit, Sit to Supine Rolling: Supervision   Supine to sit: Supervision, HOB elevated Sit to supine: Supervision, HOB elevated        Transfers Overall  transfer level: Needs assistance Equipment used: Straight cane Transfers: Sit to/from Stand, Bed to chair/wheelchair/BSC Sit to Stand: Contact guard assist           General transfer comment: able to stand from EOB with CGA/supervision      Balance Overall balance assessment: Needs assistance Sitting-balance support: Feet supported, No upper extremity supported Sitting balance-Leahy Scale: Good     Standing balance support: Single extremity supported, During functional activity, Reliant on assistive device for balance Standing balance-Leahy Scale: Fair Standing balance comment: static standing fair                           ADL either performed or assessed with clinical judgement   ADL Overall ADL's : Needs assistance/impaired     Grooming: Set up;Sitting;Standing   Upper Body Bathing: Minimal assistance;Sitting   Lower Body Bathing: Minimal assistance;Sit to/from stand;Sitting/lateral leans   Upper Body Dressing : Minimal assistance   Lower Body Dressing: Minimal assistance   Toilet Transfer: Supervision/safety   Toileting- Clothing Manipulation and Hygiene: Supervision/safety       Functional mobility during ADLs: Contact guard assist;Cane General ADL Comments: Pt appears be at baseline performance for ADLs. Likely min A - setup for ADLs, mobility with SPC to / from bathroom with CGA, no LOB but slow gait pattern     Vision Ability to See in Adequate Light: 1 Impaired Patient Visual Report: No change from baseline Additional Comments: pt with decreased far acuity - cannot read clock, but reads name badge with some difficulty. does not have glasses, has not been to eye doctor            Pertinent Vitals/Pain Pain Assessment Pain Assessment: 0-10 Pain Score:  6  Pain Location: back Pain Descriptors / Indicators: Aching, Sore Pain Intervention(s): Limited activity within patient's tolerance, Monitored during session, Repositioned      Extremity/Trunk Assessment Upper Extremity Assessment Upper Extremity Assessment: Right hand dominant;RUE deficits/detail RUE Deficits / Details:  (prior CVA deficits, minimal shoulder flexion and functional use, R hand contractures at MCP) RUE: Subluxation noted RUE Sensation: decreased light touch RUE Coordination: decreased fine motor;decreased gross motor   Lower Extremity Assessment Lower Extremity Assessment: Generalized weakness RLE Deficits / Details: hx R residual deficits from CVA   Cervical / Trunk Assessment Cervical / Trunk Assessment: Kyphotic   Communication Communication Communication: No apparent difficulties   Cognition Arousal: Alert Behavior During Therapy: WFL for tasks assessed/performed Overall Cognitive Status: Within Functional Limits for tasks assessed                                          Exercises Exercises: Other exercises Other Exercises Other Exercises: provided pt with local resources list        Home Living Family/patient expects to be discharged to:: Shelter/Homeless   Available Help at Discharge: Other (Comment);Available PRN/intermittently (girlfriend) Type of Home: Homeless                                  Prior Functioning/Environment Prior Level of Function : Independent/Modified Independent             Mobility Comments: uses SPC ADLs Comments: girlfriend assists        OT Problem List: Decreased strength;Decreased range of motion;Decreased activity tolerance;Impaired balance (sitting and/or standing);Impaired UE functional use;Pain       AM-PAC OT "6 Clicks" Daily Activity     Outcome Measure Help from another person eating meals?: A Little Help from another person taking care of personal grooming?: A Little Help from another person toileting, which includes using toliet, bedpan, or urinal?: A Little Help from another person bathing (including washing, rinsing, drying)?: A  Little Help from another person to put on and taking off regular upper body clothing?: A Little Help from another person to put on and taking off regular lower body clothing?: A Little 6 Click Score: 18   End of Session Equipment Utilized During Treatment: Gait belt;Other (comment) [facility]) Nurse Communication: Mobility status  Activity Tolerance: Patient tolerated treatment well Patient left: in bed;with call bell/phone within reach;with bed alarm set  Hemiplegia - Right/Left: Right Hemiplegia - dominant/non-dominant: Dominant Hemiplegia - caused by: Cerebral infarction                Time: 0865-7846 OT Time Calculation (min): 22 min Charges:  OT General Charges $OT Visit: 1 Visit OT Evaluation $OT Eval Low Complexity: 1 Low Gaddiel Cullens L. Leighton Brickley, OTR/L  06/13/23, 3:40 PM

## 2023-06-13 NOTE — TOC Progression Note (Signed)
Transition of Care [facility]) - Progression Note    Patient Details  Name: Craig Schmidt MRN: [id number] Date of Birth: 1970/10/30  Transition of [facility] CM/SW Contact  Armanda Heritage, RN Phone Number: 06/13/2023, 3:36 PM  Clinical Narrative:    Cab voucher delivered to nurses desk.      Barriers to Discharge: Continued Medical Work up  Expected Discharge Plan and Services         Expected Discharge Date: 06/13/23                                     Social Determinants of Health (SDOH) Interventions SDOH Screenings   Food Insecurity: Food Insecurity Present (06/11/2023)  Housing: High Risk (06/11/2023)  Transportation Needs: Unmet Transportation Needs (06/11/2023)  Utilities: At Risk (06/11/2023)  Alcohol Screen: High Risk (09/19/2021)  Depression (PHQ2-9): High Risk (06/13/2022)  Financial Resource Strain: High Risk (09/07/2022)  Stress: Stress Concern Present (10/16/2022)  Tobacco Use: High Risk (06/11/2023)    Readmission Risk Interventions     No data to display

## 2023-06-13 NOTE — Progress Notes (Signed)
Discharge teaching done, Send home via taxi.

## 2023-06-16 ENCOUNTER — Emergency Department (HOSPITAL_COMMUNITY): Payer: Medicaid Other

## 2023-06-16 ENCOUNTER — Emergency Department (HOSPITAL_COMMUNITY)
Admission: EM | Admit: 2023-06-16 | Discharge: 2023-06-17 | Disposition: A | Discharge status: Home / Self Care | Payer: Medicaid Other | Attending: Emergency Medicine | Admitting: Emergency Medicine

## 2023-06-16 ENCOUNTER — Other Ambulatory Visit: Payer: Self-pay

## 2023-06-16 DIAGNOSIS — R000 Tachycardia, unspecified: Secondary | ICD-10-CM | POA: Diagnosis not present

## 2023-06-16 DIAGNOSIS — Z91010 Allergy to peanuts: Secondary | ICD-10-CM | POA: Insufficient documentation

## 2023-06-16 DIAGNOSIS — Z7902 Long term (current) use of antithrombotics/antiplatelets: Secondary | ICD-10-CM | POA: Diagnosis not present

## 2023-06-16 DIAGNOSIS — Z7901 Long term (current) use of anticoagulants: Secondary | ICD-10-CM | POA: Diagnosis not present

## 2023-06-16 DIAGNOSIS — I509 Heart failure, unspecified: Secondary | ICD-10-CM | POA: Diagnosis not present

## 2023-06-16 DIAGNOSIS — K5900 Constipation, unspecified: Secondary | ICD-10-CM | POA: Insufficient documentation

## 2023-06-16 DIAGNOSIS — Z8673 Personal history of transient ischemic attack (TIA), and cerebral infarction without residual deficits: Secondary | ICD-10-CM | POA: Insufficient documentation

## 2023-06-16 DIAGNOSIS — N189 Chronic kidney disease, unspecified: Secondary | ICD-10-CM | POA: Insufficient documentation

## 2023-06-16 DIAGNOSIS — M545 Low back pain, unspecified: Secondary | ICD-10-CM | POA: Diagnosis present

## 2023-06-16 DIAGNOSIS — R109 Unspecified abdominal pain: Secondary | ICD-10-CM

## 2023-06-16 NOTE — ED Provider Notes (Signed)
[place] EMERGENCY DEPARTMENT AT [facility] Provider Note   CSN: [id number] Arrival date & time: 06/16/23  2257     History {Add pertinent medical, surgical, social history, OB history to HPI:1} Chief Complaint  Patient presents with   Back Pain    Craig Schmidt is a 52 y.o. male.  The history is provided by the patient and medical records.  Back Pain Craig Schmidt is a 52 y.o. male who presents to the Emergency Department complaining of *** Low back pain.  Released five days ago for pneumonia and fluid around heart. Abx are done.  Worse tonight, started earlier today. Right side chest/abd/back.   Felt feverish earlier and called ambulance Has cough, dry. Pain with cough.  No vomiting. Urinating well.  Takes meds.  No falls/injuries.  Uses rollater Lives with gf. Currently homeless Smokes tobacco, drinks one 40 oz daily, none today.  Uses crack cocaine, last use several days ago.      Home Medications Prior to Admission medications   Medication Sig Start Date End Date Taking? Authorizing Provider  apixaban (ELIQUIS) 5 MG TABS tablet Take 1 tablet (5 mg total) by mouth 2 (two) times daily. 02/05/23   [place], Anderson Malta, [name]  atorvastatin (LIPITOR) 40 MG tablet Take 1 tablet (40 mg total) by mouth daily. 06/10/23   Marinda Elk, MD  carvedilol (COREG) 3.125 MG tablet Take 1 tablet (3.125 mg total) by mouth 2 (two) times daily. 06/10/23   Marinda Elk, MD  clopidogrel (PLAVIX) 75 MG tablet Take 1 tablet (75 mg total) by mouth daily. 06/10/23   Marinda Elk, MD  furosemide (LASIX) 40 MG tablet Take 2 tablets (80 mg total) by mouth daily. 06/10/23 09/08/23  Marinda Elk, MD  methocarbamol (ROBAXIN) 750 MG tablet Take 1 tablet (750 mg total) by mouth every 8 (eight) hours as needed for up to 5 days for muscle spasms. 06/13/23 06/18/23  Narda Bonds, MD  traMADol (ULTRAM) 50 MG tablet Take 1 tablet (50 mg total) by mouth  every 6 (six) hours as needed for up to 3 days for severe pain (pain score 7-10) or moderate pain (pain score 4-6). 06/13/23 06/16/23  Narda Bonds, MD      Allergies    Shellfish allergy and Peanut (diagnostic)    Review of Systems   Review of Systems  Musculoskeletal:  Positive for back pain.  All other systems reviewed and are negative.   Physical Exam Updated Vital Signs BP (!) 128/93   Pulse (!) 104   Temp 98.9 F (37.2 C) (Oral)   Resp 19   SpO2 100%  Physical Exam Vitals and nursing note reviewed.  Constitutional:      Appearance: He is well-developed.  HENT:     Head: Normocephalic and atraumatic.  Cardiovascular:     Rate and Rhythm: Regular rhythm. Tachycardia present.     Heart sounds: No murmur heard. Pulmonary:     Effort: Pulmonary effort is normal. No respiratory distress.     Breath sounds: Normal breath sounds.  Abdominal:     Palpations: Abdomen is soft.     Tenderness: There is no abdominal tenderness. There is no guarding or rebound.  Musculoskeletal:        General: No tenderness.     Comments: Trace edema to BLE, right greater than left  Skin:    General: Skin is warm and dry.  Neurological:     Mental Status: He is  alert and oriented to person, place, and time.     Comments: 3/5 strength in RUE.  3-4/5 strength in BLE  Psychiatric:        Behavior: Behavior normal.     ED Results / Procedures / Treatments   Labs (all labs ordered are listed, but only abnormal results are displayed) Labs Reviewed - No data to display  EKG None  Radiology No results found.  Procedures Procedures  {Document cardiac monitor, telemetry assessment procedure when appropriate:1}  Medications Ordered in ED Medications - No data to display  ED Course/ Medical Decision Making/ A&P   {   Click here for ABCD2, HEART and other calculatorsREFRESH Note before signing :1}                              Medical Decision Making  ***  {Document critical  care time when appropriate:1} {Document review of labs and clinical decision tools ie heart score, Chads2Vasc2 etc:1}  {Document your independent review of radiology images, and any outside records:1} {Document your discussion with family members, caretakers, and with consultants:1} {Document social determinants of health affecting pt's care:1} {Document your decision making why or why not admission, treatments were needed:1} Final Clinical Impression(s) / ED Diagnoses Final diagnoses:  None    Rx / DC Orders ED Discharge Orders     None

## 2023-06-16 NOTE — ED Triage Notes (Signed)
Pt B[facility] from [facility]. Pt c/o lower back pain and a cough. Pt also c/o pain when breathing. Pt Hx pneumonia. Pt discharged this past week with antibiotics.  Rhonchi left lower lobe 124/82 112HR 20RR

## 2023-06-17 ENCOUNTER — Emergency Department (HOSPITAL_COMMUNITY): Payer: Medicaid Other

## 2023-06-17 ENCOUNTER — Telehealth: Payer: Self-pay

## 2023-06-17 DIAGNOSIS — J189 Pneumonia, unspecified organism: Secondary | ICD-10-CM

## 2023-06-17 LAB — CBC WITH DIFFERENTIAL/PLATELET
Abs Immature Granulocytes: 0.06 10*3/uL (ref 0.00–0.07)
Basophils Absolute: 0.1 10*3/uL (ref 0.0–0.1)
Basophils Relative: 1 %
Eosinophils Absolute: 0.5 10*3/uL (ref 0.0–0.5)
Eosinophils Relative: 5 %
HCT: 36.7 % — ABNORMAL LOW (ref 39.0–52.0)
Hemoglobin: 11.5 g/dL — ABNORMAL LOW (ref 13.0–17.0)
Immature Granulocytes: 1 %
Lymphocytes Relative: 20 %
Lymphs Abs: 2.3 10*3/uL (ref 0.7–4.0)
MCH: 29 pg (ref 26.0–34.0)
MCHC: 31.3 g/dL (ref 30.0–36.0)
MCV: 92.4 fL (ref 80.0–100.0)
Monocytes Absolute: 0.7 10*3/uL (ref 0.1–1.0)
Monocytes Relative: 6 %
Neutro Abs: 7.6 10*3/uL (ref 1.7–7.7)
Neutrophils Relative %: 67 %
Platelets: 647 10*3/uL — ABNORMAL HIGH (ref 150–400)
RBC: 3.97 MIL/uL — ABNORMAL LOW (ref 4.22–5.81)
RDW: 14.6 % (ref 11.5–15.5)
WBC: 11.1 10*3/uL — ABNORMAL HIGH (ref 4.0–10.5)
nRBC: 0 % (ref 0.0–0.2)

## 2023-06-17 LAB — RAPID URINE DRUG SCREEN, HOSP PERFORMED
Amphetamines: NOT DETECTED
Barbiturates: NOT DETECTED
Benzodiazepines: NOT DETECTED
Cocaine: POSITIVE — AB
Opiates: NOT DETECTED
Tetrahydrocannabinol: NOT DETECTED

## 2023-06-17 LAB — COMPREHENSIVE METABOLIC PANEL
ALT: 15 U/L (ref 0–44)
AST: 34 U/L (ref 15–41)
Albumin: 3 g/dL — ABNORMAL LOW (ref 3.5–5.0)
Alkaline Phosphatase: 152 U/L — ABNORMAL HIGH (ref 38–126)
Anion gap: 8 (ref 5–15)
BUN: 27 mg/dL — ABNORMAL HIGH (ref 6–20)
CO2: 26 mmol/L (ref 22–32)
Calcium: 8.9 mg/dL (ref 8.9–10.3)
Chloride: 103 mmol/L (ref 98–111)
Creatinine, Ser: 1.35 mg/dL — ABNORMAL HIGH (ref 0.61–1.24)
GFR, Estimated: >60 mL/min (ref >60)
Glucose, Bld: 102 mg/dL — ABNORMAL HIGH (ref 70–99)
Potassium: 4.4 mmol/L (ref 3.5–5.1)
Sodium: 137 mmol/L (ref 135–145)
Total Bilirubin: 0.5 mg/dL (ref <1.2)
Total Protein: 8.4 g/dL — ABNORMAL HIGH (ref 6.5–8.1)

## 2023-06-17 LAB — URINALYSIS, ROUTINE W REFLEX MICROSCOPIC
Bacteria, UA: NONE SEEN
Bilirubin Urine: NEGATIVE
Glucose, UA: NEGATIVE mg/dL
Hgb urine dipstick: NEGATIVE
Ketones, ur: NEGATIVE mg/dL
Leukocytes,Ua: NEGATIVE
Nitrite: NEGATIVE
Protein, ur: 100 mg/dL — AB
Specific Gravity, Urine: 1.021 (ref 1.005–1.030)
pH: 6 (ref 5.0–8.0)

## 2023-06-17 LAB — ETHANOL: Alcohol, Ethyl (B): <10 mg/dL (ref <10)

## 2023-06-17 LAB — TROPONIN I (HIGH SENSITIVITY)
Troponin I (High Sensitivity): 23 ng/L — ABNORMAL HIGH (ref <18)
Troponin I (High Sensitivity): 21 ng/L — ABNORMAL HIGH (ref <18)

## 2023-06-17 LAB — LIPASE, BLOOD: Lipase: 34 U/L (ref 11–51)

## 2023-06-17 LAB — BRAIN NATRIURETIC PEPTIDE: B Natriuretic Peptide: 1032.8 pg/mL — ABNORMAL HIGH (ref 0.0–100.0)

## 2023-06-17 MED ORDER — IOHEXOL 350 MG/ML SOLN
100.0000 mL | Freq: Once | INTRAVENOUS | Status: AC | PRN
  Administered 2023-06-17: 100 mL via INTRAVENOUS

## 2023-06-17 MED ORDER — LIDOCAINE 5 % EX PTCH
1.0000 | MEDICATED_PATCH | CUTANEOUS | Status: DC
  Administered 2023-06-17: 1 via TRANSDERMAL
  Filled 2023-06-17: qty 1

## 2023-06-17 MED ORDER — CARVEDILOL 3.125 MG PO TABS
3.1250 mg | ORAL_TABLET | Freq: Once | ORAL | Status: AC
  Administered 2023-06-17: 3.125 mg via ORAL
  Filled 2023-06-17: qty 1

## 2023-06-17 MED ORDER — FLEET ENEMA RE ENEM
1.0000 | ENEMA | Freq: Once | RECTAL | Status: AC
  Administered 2023-06-17: 1 via RECTAL
  Filled 2023-06-17: qty 1

## 2023-06-17 MED ORDER — TRAMADOL HCL 50 MG PO TABS
50.0000 mg | ORAL_TABLET | Freq: Once | ORAL | Status: AC
  Administered 2023-06-17: 50 mg via ORAL
  Filled 2023-06-17: qty 1

## 2023-06-17 NOTE — Telephone Encounter (Signed)
Printed out for provider signature

## 2023-06-17 NOTE — Transitions of Care (Post Inpatient/ED Visit) (Signed)
   06/17/2023  Name: Craig Schmidt MRN: [id number] DOB: 1971/05/04  Today's TOC FU Call Status: Today's TOC FU Call Status:: Successful TOC FU Call Completed TOC FU Call Complete Date: 06/17/23 Patient's Name and Date of Birth confirmed.  Transition Care Management Follow-up Telephone Call Date of Discharge: 06/13/23 Discharge Facility: [facility]) Type of Discharge: Inpatient Admission Primary Inpatient Discharge Diagnosis:: multifocal pneumonia - was also seen in ED 11/23-11/25/2204 How have you been since you were released from the hospital?: Same Any questions or concerns?: Yes (he said his still has a cough and right lower rib pain. and just left the ED this morning.) Patient Questions/Concerns Addressed: Notified Provider of Patient Questions/Concerns (he is homeles and said he has been working with 2 caseworker but was not sure who. I told him that I would place a referral to [facility] for housing resoures and he was in agreement.  He said he has been staying on the streets for 30 years.)  Items Reviewed: Did you receive and understand the discharge instructions provided?: Yes Medications obtained,verified, and reconciled?: Partial Review Completed Reason for Partial Mediation Review: He said he has all of his medications and did not have any questions about the med regime and did not need to review the med list Any new allergies since your discharge?: No Dietary orders reviewed?: Yes Type of Diet Ordered:: heart healthy, low sodium.  Very difficult to adhere to because he is homeless Do you have support at home?: Yes People in Home: spouse Name of Support/Comfort Primary Source: his wife.  Medications Reviewed Today: Medications Reviewed Today   Medications were not reviewed in this encounter     Home Care and Equipment/Supplies: Were Home Health Services Ordered?: No Any new equipment or medical supplies ordered?: Yes Name of Medical supply agency?: He said he  received a new rollator from them hospital when he was discharged. Were you able to get the equipment/medical supplies?: Yes Do you have any questions related to the use of the equipment/supplies?: No  Functional Questionnaire: Do you need assistance with bathing/showering or dressing?: Yes (his wife assists as needed) Do you need assistance with meal preparation?: Yes (homeless, unable to prepare meals) Do you need assistance with eating?: No Do you have difficulty maintaining continence: No Do you need assistance with getting out of bed/getting out of a chair/moving?: Yes (ambulates with rollator) Do you have difficulty managing or taking your medications?: Yes (his wife manages his medication regime and sets up a pill box for him)  Follow up appointments reviewed: PCP Follow-up appointment confirmed?: Yes Date of PCP follow-up appointment?: 07/02/23 Follow-up Provider: Ricky Stabs, NP Specialist Hospital Follow-up appointment confirmed?: NA Do you need transportation to your follow-up appointment?: Yes Transportation Need Intervention Addressed By:: Other: (I text the patient the phone number for [facility] as he requested) Do you understand care options if your condition(s) worsen?: Yes-patient verbalized understanding    SIGNATURE Robyne Peers, RN

## 2023-06-19 ENCOUNTER — Emergency Department (HOSPITAL_COMMUNITY): Payer: Medicaid Other

## 2023-06-19 ENCOUNTER — Other Ambulatory Visit: Payer: Self-pay

## 2023-06-19 ENCOUNTER — Inpatient Hospital Stay (HOSPITAL_COMMUNITY)
Admission: EM | Admit: 2023-06-19 | Discharge: 2023-06-22 | DRG: 871 | Disposition: A | Discharge status: Home / Self Care | Payer: Medicaid Other | Attending: Family Medicine | Admitting: Family Medicine

## 2023-06-19 ENCOUNTER — Encounter (HOSPITAL_COMMUNITY): Payer: Self-pay

## 2023-06-19 DIAGNOSIS — Z7141 Alcohol abuse counseling and surveillance of alcoholic: Secondary | ICD-10-CM

## 2023-06-19 DIAGNOSIS — Z8673 Personal history of transient ischemic attack (TIA), and cerebral infarction without residual deficits: Secondary | ICD-10-CM

## 2023-06-19 DIAGNOSIS — Z833 Family history of diabetes mellitus: Secondary | ICD-10-CM

## 2023-06-19 DIAGNOSIS — Z91013 Allergy to seafood: Secondary | ICD-10-CM

## 2023-06-19 DIAGNOSIS — Z716 Tobacco abuse counseling: Secondary | ICD-10-CM

## 2023-06-19 DIAGNOSIS — N1831 Chronic kidney disease, stage 3a: Secondary | ICD-10-CM | POA: Diagnosis present

## 2023-06-19 DIAGNOSIS — Z91199 Patient's noncompliance with other medical treatment and regimen due to unspecified reason: Secondary | ICD-10-CM

## 2023-06-19 DIAGNOSIS — I2510 Atherosclerotic heart disease of native coronary artery without angina pectoris: Secondary | ICD-10-CM | POA: Diagnosis present

## 2023-06-19 DIAGNOSIS — I071 Rheumatic tricuspid insufficiency: Secondary | ICD-10-CM | POA: Diagnosis present

## 2023-06-19 DIAGNOSIS — R918 Other nonspecific abnormal finding of lung field: Secondary | ICD-10-CM | POA: Diagnosis not present

## 2023-06-19 DIAGNOSIS — Z7901 Long term (current) use of anticoagulants: Secondary | ICD-10-CM

## 2023-06-19 DIAGNOSIS — J189 Pneumonia, unspecified organism: Secondary | ICD-10-CM | POA: Diagnosis present

## 2023-06-19 DIAGNOSIS — F17210 Nicotine dependence, cigarettes, uncomplicated: Secondary | ICD-10-CM | POA: Diagnosis present

## 2023-06-19 DIAGNOSIS — F1410 Cocaine abuse, uncomplicated: Secondary | ICD-10-CM | POA: Diagnosis present

## 2023-06-19 DIAGNOSIS — M940 Chondrocostal junction syndrome [Tietze]: Secondary | ICD-10-CM | POA: Diagnosis present

## 2023-06-19 DIAGNOSIS — J440 Chronic obstructive pulmonary disease with acute lower respiratory infection: Secondary | ICD-10-CM | POA: Diagnosis present

## 2023-06-19 DIAGNOSIS — Z7151 Drug abuse counseling and surveillance of drug abuser: Secondary | ICD-10-CM

## 2023-06-19 DIAGNOSIS — Z7902 Long term (current) use of antithrombotics/antiplatelets: Secondary | ICD-10-CM

## 2023-06-19 DIAGNOSIS — R0789 Other chest pain: Principal | ICD-10-CM

## 2023-06-19 DIAGNOSIS — F1010 Alcohol abuse, uncomplicated: Secondary | ICD-10-CM | POA: Diagnosis present

## 2023-06-19 DIAGNOSIS — E875 Hyperkalemia: Secondary | ICD-10-CM | POA: Diagnosis present

## 2023-06-19 DIAGNOSIS — I5042 Chronic combined systolic (congestive) and diastolic (congestive) heart failure: Secondary | ICD-10-CM | POA: Diagnosis present

## 2023-06-19 DIAGNOSIS — J188 Other pneumonia, unspecified organism: Secondary | ICD-10-CM | POA: Diagnosis present

## 2023-06-19 DIAGNOSIS — I130 Hypertensive heart and chronic kidney disease with heart failure and stage 1 through stage 4 chronic kidney disease, or unspecified chronic kidney disease: Secondary | ICD-10-CM | POA: Diagnosis present

## 2023-06-19 DIAGNOSIS — Z79899 Other long term (current) drug therapy: Secondary | ICD-10-CM

## 2023-06-19 DIAGNOSIS — Z91010 Allergy to peanuts: Secondary | ICD-10-CM

## 2023-06-19 DIAGNOSIS — I252 Old myocardial infarction: Secondary | ICD-10-CM

## 2023-06-19 DIAGNOSIS — Z5902 Unsheltered homelessness: Secondary | ICD-10-CM

## 2023-06-19 DIAGNOSIS — E785 Hyperlipidemia, unspecified: Secondary | ICD-10-CM | POA: Diagnosis present

## 2023-06-19 DIAGNOSIS — J850 Gangrene and necrosis of lung: Secondary | ICD-10-CM | POA: Diagnosis present

## 2023-06-19 DIAGNOSIS — J439 Emphysema, unspecified: Secondary | ICD-10-CM | POA: Diagnosis present

## 2023-06-19 DIAGNOSIS — A419 Sepsis, unspecified organism: Secondary | ICD-10-CM | POA: Diagnosis not present

## 2023-06-19 DIAGNOSIS — Z87892 Personal history of anaphylaxis: Secondary | ICD-10-CM

## 2023-06-19 DIAGNOSIS — Z1152 Encounter for screening for COVID-19: Secondary | ICD-10-CM

## 2023-06-19 DIAGNOSIS — I429 Cardiomyopathy, unspecified: Secondary | ICD-10-CM | POA: Diagnosis present

## 2023-06-19 LAB — CBC WITH DIFFERENTIAL/PLATELET
Abs Immature Granulocytes: 0.05 10*3/uL (ref 0.00–0.07)
Basophils Absolute: 0.1 10*3/uL (ref 0.0–0.1)
Basophils Relative: 1 %
Eosinophils Absolute: 0.5 10*3/uL (ref 0.0–0.5)
Eosinophils Relative: 4 %
HCT: 40.7 % (ref 39.0–52.0)
Hemoglobin: 12.8 g/dL — ABNORMAL LOW (ref 13.0–17.0)
Immature Granulocytes: 1 %
Lymphocytes Relative: 12 %
Lymphs Abs: 1.3 10*3/uL (ref 0.7–4.0)
MCH: 29.6 pg (ref 26.0–34.0)
MCHC: 31.4 g/dL (ref 30.0–36.0)
MCV: 94.2 fL (ref 80.0–100.0)
Monocytes Absolute: 0.6 10*3/uL (ref 0.1–1.0)
Monocytes Relative: 6 %
Neutro Abs: 8 10*3/uL — ABNORMAL HIGH (ref 1.7–7.7)
Neutrophils Relative %: 76 %
Platelets: 483 10*3/uL — ABNORMAL HIGH (ref 150–400)
RBC: 4.32 MIL/uL (ref 4.22–5.81)
RDW: 15 % (ref 11.5–15.5)
WBC: 10.5 10*3/uL (ref 4.0–10.5)
nRBC: 0 % (ref 0.0–0.2)

## 2023-06-19 LAB — RAPID URINE DRUG SCREEN, HOSP PERFORMED
Amphetamines: NOT DETECTED
Barbiturates: NOT DETECTED
Benzodiazepines: NOT DETECTED
Cocaine: POSITIVE — AB
Opiates: NOT DETECTED
Tetrahydrocannabinol: NOT DETECTED

## 2023-06-19 LAB — COMPREHENSIVE METABOLIC PANEL
ALT: 13 U/L (ref 0–44)
AST: 35 U/L (ref 15–41)
Albumin: 3.4 g/dL — ABNORMAL LOW (ref 3.5–5.0)
Alkaline Phosphatase: 143 U/L — ABNORMAL HIGH (ref 38–126)
Anion gap: 12 (ref 5–15)
BUN: 25 mg/dL — ABNORMAL HIGH (ref 6–20)
CO2: 23 mmol/L (ref 22–32)
Calcium: 9.6 mg/dL (ref 8.9–10.3)
Chloride: 106 mmol/L (ref 98–111)
Creatinine, Ser: 1.24 mg/dL (ref 0.61–1.24)
GFR, Estimated: >60 mL/min (ref >60)
Glucose, Bld: 79 mg/dL (ref 70–99)
Potassium: 5.4 mmol/L — ABNORMAL HIGH (ref 3.5–5.1)
Sodium: 141 mmol/L (ref 135–145)
Total Bilirubin: 0.8 mg/dL (ref <1.2)
Total Protein: 8.9 g/dL — ABNORMAL HIGH (ref 6.5–8.1)

## 2023-06-19 LAB — RESP PANEL BY RT-PCR (RSV, FLU A&B, COVID)  RVPGX2
Influenza A by PCR: NEGATIVE
Influenza B by PCR: NEGATIVE
Resp Syncytial Virus by PCR: NEGATIVE
SARS Coronavirus 2 by RT PCR: NEGATIVE

## 2023-06-19 LAB — LIPASE, BLOOD: Lipase: 33 U/L (ref 11–51)

## 2023-06-19 LAB — TROPONIN I (HIGH SENSITIVITY)
Troponin I (High Sensitivity): 27 ng/L — ABNORMAL HIGH (ref <18)
Troponin I (High Sensitivity): 31 ng/L — ABNORMAL HIGH (ref <18)

## 2023-06-19 LAB — ETHANOL: Alcohol, Ethyl (B): <10 mg/dL (ref <10)

## 2023-06-19 LAB — BRAIN NATRIURETIC PEPTIDE: B Natriuretic Peptide: 1415.7 pg/mL — ABNORMAL HIGH (ref 0.0–100.0)

## 2023-06-19 MED ORDER — CLOPIDOGREL BISULFATE 75 MG PO TABS
75.0000 mg | ORAL_TABLET | Freq: Every day | ORAL | Status: DC
  Administered 2023-06-20 – 2023-06-22 (×3): 75 mg via ORAL
  Filled 2023-06-19 (×3): qty 1

## 2023-06-19 MED ORDER — ACETAMINOPHEN 325 MG PO TABS: 650.0000 mg | ORAL_TABLET | Freq: Four times a day (QID) | ORAL | Status: DC | PRN

## 2023-06-19 MED ORDER — FUROSEMIDE 10 MG/ML IJ SOLN
40.0000 mg | Freq: Every day | INTRAMUSCULAR | Status: DC
  Administered 2023-06-19 – 2023-06-21 (×3): 40 mg via INTRAVENOUS
  Filled 2023-06-19 (×3): qty 4

## 2023-06-19 MED ORDER — CARVEDILOL 3.125 MG PO TABS
3.1250 mg | ORAL_TABLET | Freq: Two times a day (BID) | ORAL | Status: DC
  Administered 2023-06-19 – 2023-06-22 (×6): 3.125 mg via ORAL
  Filled 2023-06-19 (×6): qty 1

## 2023-06-19 MED ORDER — IOHEXOL 350 MG/ML SOLN
75.0000 mL | Freq: Once | INTRAVENOUS | Status: AC | PRN
  Administered 2023-06-19: 75 mL via INTRAVENOUS

## 2023-06-19 MED ORDER — ATORVASTATIN CALCIUM 40 MG PO TABS
40.0000 mg | ORAL_TABLET | Freq: Every day | ORAL | Status: DC
  Administered 2023-06-20 – 2023-06-22 (×3): 40 mg via ORAL
  Filled 2023-06-19 (×3): qty 1

## 2023-06-19 MED ORDER — ACETAMINOPHEN 650 MG RE SUPP: 650.0000 mg | Freq: Four times a day (QID) | RECTAL | Status: DC | PRN

## 2023-06-19 MED ORDER — ONDANSETRON HCL 4 MG PO TABS: 4.0000 mg | ORAL_TABLET | Freq: Four times a day (QID) | ORAL | Status: DC | PRN

## 2023-06-19 MED ORDER — SENNOSIDES-DOCUSATE SODIUM 8.6-50 MG PO TABS: 1.0000 | ORAL_TABLET | Freq: Every evening | ORAL | Status: DC | PRN

## 2023-06-19 MED ORDER — APIXABAN 5 MG PO TABS
5.0000 mg | ORAL_TABLET | Freq: Two times a day (BID) | ORAL | Status: DC
  Administered 2023-06-19 – 2023-06-22 (×6): 5 mg via ORAL
  Filled 2023-06-19 (×6): qty 1

## 2023-06-19 MED ORDER — LIDOCAINE 5 % EX PTCH
1.0000 | MEDICATED_PATCH | CUTANEOUS | Status: DC
  Administered 2023-06-19 – 2023-06-21 (×3): 1 via TRANSDERMAL
  Filled 2023-06-19 (×4): qty 1

## 2023-06-19 MED ORDER — ONDANSETRON HCL 4 MG/2ML IJ SOLN: 4.0000 mg | Freq: Four times a day (QID) | INTRAMUSCULAR | Status: DC | PRN

## 2023-06-19 NOTE — ED Provider Notes (Signed)
Emergency Department Provider Note   I have reviewed the triage vital signs and the nursing notes.   HISTORY  Chief Complaint Chest Pain   HPI Craig Schmidt is a 52 y.o. male with PMH of CAD, CHF, cocaine abuse, and EtOH abuse presents to the ED with intermittent, sharp CP. No fever. Patient tells me that this is similar pain compared to what brought him to the ER on 11/24 although this time it is in the center of his chest.  He denies any injury.  No fevers or chills.  He has a cough and pain is worse with coughing.  He reports a dry cough.  He remains compliant with his home medications including Eliquis.  Denies cocaine for the past 48 hours.  Reports drinking some alcohol in the past 24 hours but cannot precisely recall how much.   Past Medical History:  Diagnosis Date   Alcohol abuse    Asthma    Brain tumor (HCC)    CAD (coronary artery disease)    Chronic HFrEF (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)    Chronic kidney disease, stage 3 (HCC)    Cocaine abuse (HCC)    History of medication noncompliance    Homelessness    MI (myocardial infarction) (HCC)    STEMI (ST elevation myocardial infarction) (HCC)    Stroke (HCC)     Review of Systems  Constitutional: No fever/chills Cardiovascular: Positive chest pain. Respiratory: Denies shortness of breath. Gastrointestinal: No abdominal pain.  No nausea, no vomiting.  No diarrhea.  No constipation. Genitourinary: Negative for dysuria. Musculoskeletal: Negative for back pain. Skin: Negative for rash. Neurological: Negative for headaches, focal weakness or numbness.  ____________________________________________   PHYSICAL EXAM:  VITAL SIGNS: ED Triage Vitals  Encounter Vitals Group     BP 06/19/23 1610 (!) 126/90     Pulse Rate 06/19/23 1610 (!) 113     Resp 06/19/23 1610 19     SpO2 06/19/23 1610 100 %     Weight 06/19/23 1600 134 lb 4.2 oz (60.9 kg)     Height 06/19/23 1600 5\' 7"  (1.702 m)    Constitutional: Alert and oriented. Well appearing and in no acute distress. Eyes: Conjunctivae are normal. Head: Atraumatic. Nose: No congestion/rhinnorhea. Mouth/Throat: Mucous membranes are moist.   Neck: No stridor.   Cardiovascular: Tachycardia. Good peripheral circulation. Grossly normal heart sounds.   Respiratory: Normal respiratory effort.  No retractions. Lungs CTAB. Gastrointestinal: Soft and nontender. No distention.  Musculoskeletal: No lower extremity tenderness nor edema. No gross deformities of extremities. Neurologic:  Normal speech and language. No gross focal neurologic deficits are appreciated.  Skin:  Skin is warm, dry and intact. No rash noted.  ____________________________________________   LABS (all labs ordered are listed, but only abnormal results are displayed)  Labs Reviewed  COMPREHENSIVE METABOLIC PANEL - Abnormal; Notable for the following components:      Result Value   Potassium 5.4 (*)    BUN 25 (*)    Total Protein 8.9 (*)    Albumin 3.4 (*)    Alkaline Phosphatase 143 (*)    All other components within normal limits  CBC WITH DIFFERENTIAL/PLATELET - Abnormal; Notable for the following components:   Hemoglobin 12.8 (*)    Platelets 483 (*)    Neutro Abs 8.0 (*)    All other components within normal limits  RAPID URINE DRUG SCREEN, HOSP PERFORMED - Abnormal; Notable for the following components:   Cocaine POSITIVE (*)  All other components within normal limits  BRAIN NATRIURETIC PEPTIDE - Abnormal; Notable for the following components:   B Natriuretic Peptide 1,415.7 (*)    All other components within normal limits  TROPONIN I (HIGH SENSITIVITY) - Abnormal; Notable for the following components:   Troponin I (High Sensitivity) 27 (*)    All other components within normal limits  TROPONIN I (HIGH SENSITIVITY) - Abnormal; Notable for the following components:   Troponin I (High Sensitivity) 31 (*)    All other components within normal  limits  RESP PANEL BY RT-PCR (RSV, FLU A&B, COVID)  RVPGX2  LIPASE, BLOOD  ETHANOL   ____________________________________________  EKG   EKG Interpretation Date/Time:  Wednesday June 19 2023 16:12:13 EST Ventricular Rate:  115 PR Interval:  147 QRS Duration:  110 QT Interval:  343 QTC Calculation: 475 R Axis:   25  Text Interpretation: Sinus tachycardia LAE, consider biatrial enlargement Incomplete left bundle branch block LVH with secondary repolarization abnormality Unchanged from prior Confirmed by Alona Bene [phone]) on 06/19/2023 4:24:53 PM        ____________________________________________  RADIOLOGY  CT Angio Chest PE W and/or Wo Contrast  Result Date: 06/19/2023 CLINICAL DATA:  Pulmonary embolism suspected. High probability, with shortness of breath. EXAM: CT ANGIOGRAPHY CHEST WITH CONTRAST TECHNIQUE: Multidetector CT imaging of the chest was performed using the standard protocol during bolus administration of intravenous contrast. Multiplanar CT image reconstructions and MIPs were obtained to evaluate the vascular anatomy. RADIATION DOSE REDUCTION: This exam was performed according to the departmental dose-optimization program which includes automated exposure control, adjustment of the mA and/or kV according to patient size and/or use of iterative reconstruction technique. CONTRAST:  75mL OMNIPAQUE IOHEXOL 350 MG/ML SOLN COMPARISON:  AP Lat chest today, CTA chest 06/17/2023, CTA chest 06/04/2023. FINDINGS: Cardiovascular: There is moderate panchamber cardiomegaly with minimal pericardial effusion again noted. Once again there is IVC and hepatic vein contrast reflux most likely indicating ongoing right heart dysfunction versus tricuspid regurgitation. The pulmonary trunk is slightly prominent, as before, without evidence of arterial embolism. The aorta and great vessels are unremarkable. There are no visible coronary calcifications. Central pulmonary veins are distended  but no more than previously. Mediastinum/Nodes: Subcarinal nodal mass again measures 2.3 cm in short axis. Less enlarged lymph nodes at the AP window up to 1.2 cm in short axis. There scattered slightly prominent hilar lymph nodes bilaterally. The thyroid gland, thoracic trachea, thoracic esophagus are unremarkable. No new or progressive adenopathy.  Axillary spaces are clear. Lungs/Pleura: Persistent diffuse bronchial thickening. Similar appearance interlobular septal thickening in the lung bases consistent with mild interstitial edema. Small right and minimal left layering pleural effusions are similar. Consolidation continues to be noted in the anterior and posterior basal segments of the right lower lobe and lateral basal segment of the left lower lobe. The left lower lobe opacity appears more hypodense which suggest liquefaction necrosis. There is improved atelectasis in the lingular base. Persisting mosaicism of the lung fields which is either due to small airways disease with air trapping or ground-glass edema. There are paraseptal emphysematous changes in the upper lobes. Scattered linear scarring or atelectasis in the bases. There is no new infiltrate. Upper Abdomen: No acute abnormality.  Hepatic steatosis. Musculoskeletal: There is thoracic kyphodextroscoliosis and mild degenerative changes. No acute or significant osseous findings are otherwise seen. Review of the MIP images confirms the above findings. IMPRESSION: 1. No evidence of arterial embolism. Slight prominence of the pulmonary trunk. 2. Cardiomegaly with minimal pericardial  effusion, IVC and hepatic vein contrast reflux the latter most likely indicating ongoing right heart dysfunction versus tricuspid regurgitation. 3. Persisting interlobular septal thickening in the lung bases consistent with mild interstitial edema. 4. Small right and minimal left layering pleural effusions are similar. 5. Consolidation in the anterior and posterior basal  segments of the right lower lobe and lateral basal segment of the left lower lobe. The left lower lobe opacity appears more hypodense which suggests liquefaction necrosis. 6. Persisting mosaicism of the lung fields which is either due to small airways disease with air trapping or ground-glass edema. 7. Emphysema. Redemonstrated bronchial thickening which could be bronchitis or congestive. 8. Stable mediastinal and hilar adenopathy. 9. Hepatic steatosis. Emphysema (ICD10-J43.9). Electronically Signed   By: Almira Bar M.D.   On: 06/19/2023 21:13   DG Chest 2 View  Result Date: 06/19/2023 CLINICAL DATA:  Chest pain and weakness. EXAM: CHEST - 2 VIEW COMPARISON:  06/16/2023 FINDINGS: Moderate cardiomegaly again demonstrated. Tiny bilateral pleural effusions are noted. Increased airspace opacity seen in the right lower lobe, suspicious for pneumonia. IMPRESSION: Increased right lower lobe airspace opacity, suspicious for pneumonia. Tiny bilateral pleural effusions. Stable cardiomegaly. Electronically Signed   By: Danae Orleans M.D.   On: 06/19/2023 16:53    ____________________________________________   PROCEDURES  Procedure(s) performed:   Procedures  CRITICAL CARE Performed by: Maia Plan Total critical care time: 35 minutes Critical care time was exclusive of separately billable procedures and treating other patients. Critical care was necessary to treat or prevent imminent or life-threatening deterioration. Critical care was time spent personally by me on the following activities: development of treatment plan with patient and/or surrogate as well as nursing, discussions with consultants, evaluation of patient's response to treatment, examination of patient, obtaining history from patient or surrogate, ordering and performing treatments and interventions, ordering and review of laboratory studies, ordering and review of radiographic studies, pulse oximetry and re-evaluation of patient's  condition.  Alona Bene, MD Emergency Medicine  ____________________________________________   INITIAL IMPRESSION / ASSESSMENT AND PLAN / ED COURSE  Pertinent labs & imaging results that were available during my care of the patient were reviewed by me and considered in my medical decision making (see chart for details).   This patient is Presenting for Evaluation of CP, which does require a range of treatment options, and is a complaint that involves a high risk of morbidity and mortality.  The Differential Diagnoses includes but is not exclusive to acute coronary syndrome, aortic dissection, pulmonary embolism, cardiac tamponade, community-acquired pneumonia, pericarditis, musculoskeletal chest wall pain, etc.   Critical Interventions-    Medications  furosemide (LASIX) injection 40 mg (has no administration in time range)  iohexol (OMNIPAQUE) 350 MG/ML injection 75 mL (75 mLs Intravenous Contrast Given 06/19/23 1959)    Reassessment after intervention:  pain slightly improved.   I decided to review pertinent External Data, and in summary patient with d/c on 11/21 with initial concern for multifocal PNA.  Procalcitonin undetectable.  Discontinued broad-spectrum antibiotics with plan for repeat chest x-ray in 3 to 4 weeks.    Clinical Laboratory Tests Ordered, included CBC without leukocytosis. BNP elevated along with mild troponin elevation similar to prior values.   Radiologic Tests Ordered, included CXR. I independently interpreted the images and agree with radiology interpretation.   Cardiac Monitor Tracing which shows sinus tachycardia.    Social Determinants of Health Risk patient with a history of polysubstance abuse.   Consult complete with [facility]. Plan for admit  with worsening infiltrate/necrosis on CT chest. No PE.   Medical Decision Making: Summary:  Patient presents emergency department with fairly atypical central chest pain.  Remains compliant with Eliquis making PE  less likely.  Does arrive with tachycardia but history of cocaine abuse and alcohol abuse.  This may be contributing.  Plan for repeat chest x-ray, labs, reassess.  He is not hypoxemic and does not appear grossly volume overloaded to suspect that congestive heart failure is playing a significant role.   Reevaluation with update and discussion with patient. Plan for admit. He is in agreement.   Patient's presentation is most consistent with acute presentation with potential threat to life or bodily function.   Disposition: admit  ____________________________________________  FINAL CLINICAL IMPRESSION(S) / ED DIAGNOSES  Final diagnoses:  Atypical chest pain  Pulmonary infiltrate    Note:  This document was prepared using Dragon voice recognition software and may include unintentional dictation errors.  Alona Bene, MD, [facility] Emergency Medicine    Rhiana Morash, Arlyss Repress, MD 06/19/23 [phone]

## 2023-06-19 NOTE — ED Triage Notes (Signed)
Ems called out for CP and PNA states that his previous PNA felt like this and wants to come get treatment.

## 2023-06-19 NOTE — ED Notes (Signed)
We tried to obtain urine sample, pt did not use the collection cup and instead urinated in the bathroom drawer.

## 2023-06-19 NOTE — H&P (Incomplete)
History and Physical    Patient: Craig Schmidt ZOX:[id number] DOB: 1971/02/01 DOA: 06/19/2023 DOS: the patient was seen and examined on 06/19/2023 PCP: Rema Fendt, NP  Patient coming from: {Point_of_Origin:[address]}  Chief Complaint:  Chief Complaint  Patient presents with   Chest Pain   HPI: Craig Schmidt is a 52 y.o. male with medical history significant of ***  Review of Systems: {ROS_Text:26778} Past Medical History:  Diagnosis Date   Alcohol abuse    Asthma    Brain tumor (HCC)    CAD (coronary artery disease)    Chronic HFrEF (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)    Chronic kidney disease, stage 3 (HCC)    Cocaine abuse (HCC)    History of medication noncompliance    Homelessness    MI (myocardial infarction) (HCC)    STEMI (ST elevation myocardial infarction) (HCC)    Stroke [facility])    Past Surgical History:  Procedure Laterality Date   BUBBLE STUDY  09/11/2021   Procedure: BUBBLE STUDY;  Surgeon: Pricilla Riffle, MD;  Location: [facility] ENDOSCOPY;  Service: Cardiovascular;;   LEFT HEART CATH AND CORONARY ANGIOGRAPHY N/A 12/19/2021   Procedure: LEFT HEART CATH AND CORONARY ANGIOGRAPHY;  Surgeon: Lyn Records, MD;  Location: [facility] INVASIVE CV LAB;  Service: Cardiovascular;  Laterality: N/A;   RIGHT/LEFT HEART CATH AND CORONARY ANGIOGRAPHY N/A 04/13/2022   Procedure: RIGHT/LEFT HEART CATH AND CORONARY ANGIOGRAPHY;  Surgeon: Laurey Morale, MD;  Location: [facility] INVASIVE CV LAB;  Service: Cardiovascular;  Laterality: N/A;   TEE WITHOUT CARDIOVERSION N/A 09/11/2021   Procedure: TRANSESOPHAGEAL ECHOCARDIOGRAM (TEE);  Surgeon: Pricilla Riffle, MD;  Location: [facility] ENDOSCOPY;  Service: Cardiovascular;  Laterality: N/A;   Social History:  reports that he has been smoking cigarettes. He has a 41 pack-year smoking history. He has been exposed to tobacco smoke. He has never used smokeless tobacco. He reports current alcohol use of about 63.0 standard drinks of alcohol per  week. He reports current drug use. Drugs: "Crack" cocaine and Cocaine.  Allergies  Allergen Reactions   Shellfish Allergy Anaphylaxis   Peanut (Diagnostic) Itching    Family History  Problem Relation Age of Onset   Diabetes Mother     Prior to Admission medications   Medication Sig Start Date End Date Taking? Authorizing Provider  apixaban (ELIQUIS) 5 MG TABS tablet Take 1 tablet (5 mg total) by mouth 2 (two) times daily. 02/05/23   Milford, Anderson Malta, [name]  atorvastatin (LIPITOR) 40 MG tablet Take 1 tablet (40 mg total) by mouth daily. 06/10/23   Marinda Elk, MD  carvedilol (COREG) 3.125 MG tablet Take 1 tablet (3.125 mg total) by mouth 2 (two) times daily. 06/10/23   Marinda Elk, MD  clopidogrel (PLAVIX) 75 MG tablet Take 1 tablet (75 mg total) by mouth daily. 06/10/23   Marinda Elk, MD  furosemide (LASIX) 40 MG tablet Take 2 tablets (80 mg total) by mouth daily. 06/10/23 09/08/23  Marinda Elk, MD    Physical Exam: Vitals:   06/19/23 2200 06/19/23 2230 06/19/23 2300 06/19/23 2315  BP: 134/83 (!) 126/96 (!) 128/92   Pulse: (!) 122 (!) 111 (!) 114 (!) 115  Resp: (!) 22 (!) 23 (!) 32 (!) 24  Temp:      TempSrc:      SpO2: 98% 97% 99% 97%  Weight:      Height:       *** Data Reviewed: {Tip this will not be  part of the note when signed- Document your independent interpretation of telemetry tracing, EKG, lab, Radiology test or any other diagnostic tests. Add any new diagnostic test ordered today. (Optional):26781} {Results:26384}  Assessment and Plan: No notes have been filed under this hospital service. Service: Hospitalist     Advance Care Planning:   Code Status: Full Code ***  Consults: ***  Family Communication: ***  Severity of Illness: {Observation/Inpatient:21159}  Author: Steffanie Rainwater, MD 06/19/2023 11:51 PM  For on call review [email or web address].

## 2023-06-20 ENCOUNTER — Other Ambulatory Visit: Payer: Self-pay

## 2023-06-20 DIAGNOSIS — J439 Emphysema, unspecified: Secondary | ICD-10-CM | POA: Diagnosis present

## 2023-06-20 DIAGNOSIS — I5042 Chronic combined systolic (congestive) and diastolic (congestive) heart failure: Secondary | ICD-10-CM | POA: Diagnosis present

## 2023-06-20 DIAGNOSIS — I071 Rheumatic tricuspid insufficiency: Secondary | ICD-10-CM | POA: Diagnosis present

## 2023-06-20 DIAGNOSIS — R918 Other nonspecific abnormal finding of lung field: Secondary | ICD-10-CM | POA: Diagnosis present

## 2023-06-20 DIAGNOSIS — Z5902 Unsheltered homelessness: Secondary | ICD-10-CM | POA: Diagnosis not present

## 2023-06-20 DIAGNOSIS — F1010 Alcohol abuse, uncomplicated: Secondary | ICD-10-CM | POA: Diagnosis present

## 2023-06-20 DIAGNOSIS — E875 Hyperkalemia: Secondary | ICD-10-CM | POA: Diagnosis present

## 2023-06-20 DIAGNOSIS — J440 Chronic obstructive pulmonary disease with acute lower respiratory infection: Secondary | ICD-10-CM | POA: Diagnosis present

## 2023-06-20 DIAGNOSIS — I429 Cardiomyopathy, unspecified: Secondary | ICD-10-CM | POA: Diagnosis present

## 2023-06-20 DIAGNOSIS — N1831 Chronic kidney disease, stage 3a: Secondary | ICD-10-CM | POA: Diagnosis present

## 2023-06-20 DIAGNOSIS — I130 Hypertensive heart and chronic kidney disease with heart failure and stage 1 through stage 4 chronic kidney disease, or unspecified chronic kidney disease: Secondary | ICD-10-CM | POA: Diagnosis present

## 2023-06-20 DIAGNOSIS — I252 Old myocardial infarction: Secondary | ICD-10-CM | POA: Diagnosis not present

## 2023-06-20 DIAGNOSIS — Z833 Family history of diabetes mellitus: Secondary | ICD-10-CM | POA: Diagnosis not present

## 2023-06-20 DIAGNOSIS — F17210 Nicotine dependence, cigarettes, uncomplicated: Secondary | ICD-10-CM | POA: Diagnosis present

## 2023-06-20 DIAGNOSIS — J850 Gangrene and necrosis of lung: Secondary | ICD-10-CM | POA: Diagnosis present

## 2023-06-20 DIAGNOSIS — Z1152 Encounter for screening for COVID-19: Secondary | ICD-10-CM | POA: Diagnosis not present

## 2023-06-20 DIAGNOSIS — Z79899 Other long term (current) drug therapy: Secondary | ICD-10-CM | POA: Diagnosis not present

## 2023-06-20 DIAGNOSIS — Z8673 Personal history of transient ischemic attack (TIA), and cerebral infarction without residual deficits: Secondary | ICD-10-CM | POA: Diagnosis not present

## 2023-06-20 DIAGNOSIS — I2510 Atherosclerotic heart disease of native coronary artery without angina pectoris: Secondary | ICD-10-CM | POA: Diagnosis present

## 2023-06-20 DIAGNOSIS — Z7902 Long term (current) use of antithrombotics/antiplatelets: Secondary | ICD-10-CM | POA: Diagnosis not present

## 2023-06-20 DIAGNOSIS — A419 Sepsis, unspecified organism: Secondary | ICD-10-CM | POA: Diagnosis present

## 2023-06-20 DIAGNOSIS — J189 Pneumonia, unspecified organism: Secondary | ICD-10-CM | POA: Diagnosis present

## 2023-06-20 DIAGNOSIS — E785 Hyperlipidemia, unspecified: Secondary | ICD-10-CM | POA: Diagnosis present

## 2023-06-20 DIAGNOSIS — F1410 Cocaine abuse, uncomplicated: Secondary | ICD-10-CM | POA: Diagnosis present

## 2023-06-20 DIAGNOSIS — Z7901 Long term (current) use of anticoagulants: Secondary | ICD-10-CM | POA: Diagnosis not present

## 2023-06-20 LAB — CBC
HCT: 31.1 % — ABNORMAL LOW (ref 39.0–52.0)
Hemoglobin: 9.8 g/dL — ABNORMAL LOW (ref 13.0–17.0)
MCH: 28.9 pg (ref 26.0–34.0)
MCHC: 31.5 g/dL (ref 30.0–36.0)
MCV: 91.7 fL (ref 80.0–100.0)
Platelets: 499 10*3/uL — ABNORMAL HIGH (ref 150–400)
RBC: 3.39 MIL/uL — ABNORMAL LOW (ref 4.22–5.81)
RDW: 15.4 % (ref 11.5–15.5)
WBC: 8.2 10*3/uL (ref 4.0–10.5)
nRBC: 0 % (ref 0.0–0.2)

## 2023-06-20 LAB — COMPREHENSIVE METABOLIC PANEL
ALT: 11 U/L (ref 0–44)
AST: 23 U/L (ref 15–41)
Albumin: 2.7 g/dL — ABNORMAL LOW (ref 3.5–5.0)
Alkaline Phosphatase: 116 U/L (ref 38–126)
Anion gap: 9 (ref 5–15)
BUN: 26 mg/dL — ABNORMAL HIGH (ref 6–20)
CO2: 25 mmol/L (ref 22–32)
Calcium: 9.1 mg/dL (ref 8.9–10.3)
Chloride: 104 mmol/L (ref 98–111)
Creatinine, Ser: 1.28 mg/dL — ABNORMAL HIGH (ref 0.61–1.24)
GFR, Estimated: >60 mL/min (ref >60)
Glucose, Bld: 88 mg/dL (ref 70–99)
Potassium: 4.5 mmol/L (ref 3.5–5.1)
Sodium: 138 mmol/L (ref 135–145)
Total Bilirubin: 0.9 mg/dL (ref <1.2)
Total Protein: 7.1 g/dL (ref 6.5–8.1)

## 2023-06-20 LAB — LACTIC ACID, PLASMA: Lactic Acid, Venous: 1.4 mmol/L (ref 0.5–1.9)

## 2023-06-20 LAB — PROCALCITONIN: Procalcitonin: <0.1 ng/mL

## 2023-06-20 LAB — MRSA NEXT GEN BY PCR, NASAL: MRSA by PCR Next Gen: NOT DETECTED

## 2023-06-20 MED ORDER — SODIUM CHLORIDE 0.9 % IV SOLN
2.0000 g | Freq: Three times a day (TID) | INTRAVENOUS | Status: DC
  Administered 2023-06-20: 2 g via INTRAVENOUS
  Filled 2023-06-20: qty 12.5

## 2023-06-20 MED ORDER — IPRATROPIUM-ALBUTEROL 0.5-2.5 (3) MG/3ML IN SOLN
3.0000 mL | Freq: Once | RESPIRATORY_TRACT | Status: AC
  Administered 2023-06-20: 3 mL via RESPIRATORY_TRACT
  Filled 2023-06-20: qty 3

## 2023-06-20 MED ORDER — SODIUM CHLORIDE 0.9 % IV SOLN
3.0000 g | Freq: Four times a day (QID) | INTRAVENOUS | Status: DC
  Administered 2023-06-20 – 2023-06-21 (×4): 3 g via INTRAVENOUS
  Filled 2023-06-20 (×5): qty 8

## 2023-06-20 MED ORDER — TIOTROPIUM BROMIDE MONOHYDRATE 18 MCG IN CAPS: 18.0000 ug | ORAL_CAPSULE | Freq: Every day | RESPIRATORY_TRACT | Status: DC

## 2023-06-20 MED ORDER — IPRATROPIUM-ALBUTEROL 0.5-2.5 (3) MG/3ML IN SOLN: 3.0000 mL | Freq: Four times a day (QID) | RESPIRATORY_TRACT | Status: DC | PRN

## 2023-06-20 MED ORDER — UMECLIDINIUM BROMIDE 62.5 MCG/ACT IN AEPB
1.0000 | INHALATION_SPRAY | Freq: Every day | RESPIRATORY_TRACT | Status: DC
  Administered 2023-06-20 – 2023-06-22 (×2): 1 via RESPIRATORY_TRACT
  Filled 2023-06-20 (×2): qty 7

## 2023-06-20 MED ORDER — CEFEPIME HCL 2 G IV SOLR
2.0000 g | Freq: Once | INTRAVENOUS | Status: AC
  Administered 2023-06-20: 2 g via INTRAVENOUS
  Filled 2023-06-20: qty 12.5

## 2023-06-20 MED ORDER — VANCOMYCIN HCL IN DEXTROSE 1-5 GM/200ML-% IV SOLN
1000.0000 mg | Freq: Once | INTRAVENOUS | Status: AC
  Administered 2023-06-20: 1000 mg via INTRAVENOUS
  Filled 2023-06-20: qty 200

## 2023-06-20 MED ORDER — GUAIFENESIN ER 600 MG PO TB12
600.0000 mg | ORAL_TABLET | Freq: Two times a day (BID) | ORAL | Status: DC
  Administered 2023-06-20 – 2023-06-22 (×5): 600 mg via ORAL
  Filled 2023-06-20 (×5): qty 1

## 2023-06-20 MED ORDER — VANCOMYCIN HCL 1250 MG/250ML IV SOLN
1250.0000 mg | INTRAVENOUS | Status: DC
  Filled 2023-06-20: qty 250

## 2023-06-20 NOTE — ED Notes (Signed)
Pt ambulated to the br.

## 2023-06-20 NOTE — Progress Notes (Signed)
Pharmacy Antibiotic Note  Craig Schmidt is a 52 y.o. male admitted on 06/19/2023 with pneumonia.  Pharmacy has been consulted for Vanco, Cefepime dosing.  Active Problem(s): CP - patient's 5th ED visit in November alone. (All with SOB and PNA)   PMH: homelessness for 30 years, cocaine abuse, alcohol abuse, medication non-adherence, CAD, HTN, HLD, cardiomyopathy, NSTEMI, HFrEF, CVA, hyperlipidemia, and CKD stage IIIA   ID: Sepsis with multifocal PNA with LLL liquiefication necrosis. -  new findings on CT concerning for possible lung abscesses.   11/28 Vanco>> 11/28 Cefepime>>  Plan: Cefepime 2g IV q8hr for severity of PNA (CrCl 49) Vanco 1g IV x 1 in ED Vancomycin 1250 mg IV Q 24 hrs. Goal AUC 400-550. Expected AUC: 491 SCr used: 1.28    Height: 5\' 7"  (170.2 cm) Weight: 60.9 kg (134 lb 4.2 oz) IBW/kg (Calculated) : 66.1  Temp (24hrs), Avg:98 F (36.7 C), Min:97.6 F (36.4 C), Max:98.3 F (36.8 C)  Recent Labs  Lab 06/13/23 1210 06/16/23 2334 06/19/23 1700 06/20/23 0030 06/20/23 0512  WBC 13.8* 11.1* 10.5  --  8.2  CREATININE 1.21 1.35* 1.24  --  1.28*  LATICACIDVEN  --   --   --  1.4  --     Estimated Creatinine Clearance: 59.5 mL/min (A) (by C-G formula based on SCr of 1.28 mg/dL (H)).    Allergies  Allergen Reactions   Shellfish Allergy Anaphylaxis   Peanut (Diagnostic) Itching    Kyjuan Gause S. Merilynn Finland, PharmD, BCPS Clinical Staff Pharmacist [email or web address]  Pasty Spillers 06/20/2023 10:24 AM

## 2023-06-20 NOTE — Consult Note (Addendum)
NAME:  Craig Schmidt, MRN:  [id number], DOB:  [birth year], LOS: 0 ADMISSION DATE:  06/19/2023, CONSULTATION DATE: 06/20/2023 REFERRING MD: Dr. Rennis Chris, CHIEF COMPLAINT: Lung abscess, necrotic pneumonia  History of Present Illness:  52 year old with recent multiple hospitalizations and ED visits for chest pain, chest discomfort, pneumonia.  Has had multiple courses of antibiotics recently. 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  Was discharged on doxycycline but may not have completed course of treatment.  Signed out AMA Reevaluated 11/12 with chest discomfort, shortness of breath, was admitted and treated for pneumonia with a course of vancomycin and cefepime, course was switched to ampicillin and doxycycline and discontinued on 11/18 at discharge Presented back to the hospital on the same day of discharge with chest discomfort on 11/18-was admitted up until 11/21 and he was on vancomycin and cefepime during this hospitalization.  Was not discharged on any antibiotics. Reevaluated in the emergency department 11/24 with chest discomfort-was not admitted Presented again 11/27 and was admitted with chest discomfort.  Has had multiple CTs showing right basal pneumonia, interstitial changes, mosaic appearance of the lung  He does have chronic kidney disease, heart failure with reduced ejection fraction, coronary artery disease history of stroke, history of vestibular schwannoma, recent MRI showing cortical infarcts and  numerous additional remote infarcts with some volume loss  Pertinent  Medical History   Past Medical History:  Diagnosis Date   Alcohol abuse    Asthma    Brain tumor (HCC)    CAD (coronary artery disease)    Chronic HFrEF (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)    Chronic kidney disease, stage 3 (HCC)    Cocaine abuse (HCC)    History of medication noncompliance    Homelessness    MI (myocardial infarction) (HCC)    STEMI (ST elevation myocardial infarction) (HCC)    Stroke (HCC)    Significant Hospital Events: Including procedures, antibiotic start and stop dates in addition to other pertinent events   CT chest 11/27/2024IMPRESSION: 1. No evidence of arterial embolism. Slight prominence of the pulmonary trunk. 2. Cardiomegaly with minimal pericardial effusion, IVC and hepatic vein contrast reflux the latter most likely indicating ongoing right heart dysfunction versus tricuspid regurgitation. 3. Persisting interlobular septal thickening in the lung bases consistent with mild interstitial edema. 4. Small right and minimal left layering pleural effusions are similar. 5. Consolidation in the anterior and posterior basal segments of the right lower lobe and lateral basal segment of the left lower lobe. The left lower lobe opacity appears more hypodense which suggests liquefaction necrosis. 6. Persisting mosaicism of the lung fields which is either due to small airways disease with air trapping or ground-glass edema. 7. Emphysema. Redemonstrated bronchial thickening which could be bronchitis or congestive. 8. Stable mediastinal and hilar adenopathy. 9. Hepatic steatosis.  Interim History / Subjective:  Denies acute pain at present Does have some pain with deep breathing Denies a fever at present  Objective   Blood pressure (!) 120/93, pulse  98, temperature 97.9 F (36.6 C), resp. rate 20, height 5\' 7"  (1.702 m), weight 62.3 kg, SpO2 100%.        Intake/Output Summary  (Last 24 hours) at 06/20/2023 1546 Last data filed at 06/20/2023 1340 Gross per 24 hour  Intake 220 ml  Output 3101 ml  Net -2881 ml   Filed Weights   06/19/23 1600 06/20/23 1423  Weight: 60.9 kg 62.3 kg    Examination: General: Chronically ill-appearing HENT: Poor dental hygiene Lungs: Decreased air movement bilaterally Cardiovascular: S1-S2 appreciated Abdomen: Soft, bowel sounds appreciated Extremities: No clubbing, no edema Neuro: Awake alert interactive GU:   I did review his current CT compared with one performed 06/04/2023-basal infiltrative findings on the left do appear slightly improved with the necrotic area appearing stable, infiltrate at the base on the right also did appear improved with an area of necrosis which appears smaller but persistent  Resolved Hospital Problem list     Assessment & Plan:  Necrotic pneumonia bibasal lungs -Has had multiple courses of antibiotics -Leukocytosis have improved through the course of his multiple evaluations in the hospital and antibiotic therapy -Procalcitonin less than 0.10 -He is on room air at present  As he stated, he did have a fever recently prior to presentation -Will switch to Unasyn,  any beta-lactam/beta-lactamase combination will suffice -This may be continued for 48 hours, if no fever and continues to tolerate orally may be switched to Augmentin Plan will be for him to be on antibiotics for at least 3 weeks to follow-up with a CT scan for evolution of findings, course of treatment may be up to 6 weeks of antibiotics  -Anticipate about 48 hours for Unasyn and then may be switched to Augmentin -May be switched sooner if no fevers and he continues to trend well  If he does not continue to improve or fevers/leukocytosis, a bronchoscopy may be considered.  I do not believe this will be useful at present  Obstructive lung disease/active smoker -Continue bronchodilator therapy -Smoking cessation counseling  I did  counsel him about the need to be compliant with the course of treatment  Counseling regarding drug use, alcohol use, cessation of smoking, medication adherence  Social situation may also need to be addressed with assistance of social services  The risk of worsening is high unfortunately if he does not complete course of treatment or continues to use illicit substances, crack cocaine which can definitely continue to cause significant inflammation, can lead to infarction, predisposition to further infections  Other active medical problems including his coronary artery disease, heart failure with reduced ejection fraction, hypertension, dyslipidemia, chronic kidney disease stage IIIa, per primary  Will follow  Virl Diamond, MD [place] [facility] Pager: See Loretha Stapler

## 2023-06-20 NOTE — ED Notes (Signed)
ED TO INPATIENT HANDOFF REPORT  Name/Age/Gender Lynelle Doctor Buxton 52 y.o. male  Code Status    Code Status Orders  (From admission, onward)           Start     Ordered   06/19/23 2315  Full code  Continuous       Question:  By:  Answer:  Consent: discussion documented in EHR   06/19/23 2315           Code Status History     Date Active Date Inactive Code Status Order ID Comments User Context   06/11/2023 0225 06/14/2023 0010 Full Code [id number]  Angie Fava, DO ED   06/04/2023 0810 06/10/2023 1739 Full Code [id number]  Lorin Glass, MD ED   05/25/2023 1820 05/31/2023 2048 Full Code [id number]  Nolberto Hanlon, MD ED   11/28/2022 1620 12/05/2022 2107 Full Code [id number]  Jonah Blue, MD ED   04/13/2022 0842 04/13/2022 1705 Full Code [id number]  Laurey Morale, MD Inpatient   12/19/2021 1909 12/27/2021 2321 Full Code [id number]  Lyn Records, MD Inpatient   09/08/2021 0931 10/03/2021 2351 Full Code [id number]  Milon Dikes, MD ED   05/14/2015 1117 05/18/2015 1701 Full Code [id number]  Dorothea Ogle, MD ED       Home/SNF/Other Home  Chief Complaint Multifocal pneumonia [J18.9]  Level of Care/Admitting Diagnosis ED Disposition     ED Disposition  Admit   Condition  --   Comment  Hospital Area: [facility] [[id number]]  Level of Care: Telemetry [5]  Admit to tele based on following criteria: Acute CHF  May place patient in observation at [facility] or [facility] if equivalent level of care is available:: No  Covid Evaluation: Asymptomatic - no recent exposure (last 10 days) testing not required  Diagnosis: Multifocal pneumonia [[id number]]  Admitting Physician: Steffanie Rainwater [[id number]]  Attending Physician: Steffanie Rainwater [[id number]]          Medical History Past Medical History:  Diagnosis Date   Alcohol abuse    Asthma    Brain tumor (HCC)    CAD (coronary artery disease)    Chronic HFrEF (heart failure with reduced ejection  fraction) (HCC)    Chronic kidney disease, stage 3 (HCC)    Cocaine abuse (HCC)    History of medication noncompliance    Homelessness    MI (myocardial infarction) (HCC)    STEMI (ST elevation myocardial infarction) (HCC)    Stroke (HCC)     Allergies Allergies  Allergen Reactions   Shellfish Allergy Anaphylaxis   Peanut (Diagnostic) Itching    IV Location/Drains/Wounds Patient Lines/Drains/Airways Status     Active Line/Drains/Airways     Name Placement date Placement time Site Days   Peripheral IV 06/19/23 20 G 1.88" Left Antecubital 06/19/23  1954  Antecubital  1            Labs/Imaging Results for orders placed or performed during the hospital encounter of 06/19/23 (from the past 48 hour(s))  Comprehensive metabolic panel     Status: Abnormal   Collection Time: 06/19/23  5:00 PM  Result Value Ref Range   Sodium 141 135 - 145 mmol/L   Potassium 5.4 (H) 3.5 - 5.1 mmol/L   Chloride 106 98 - 111 mmol/L   CO2 23 22 - 32 mmol/L   Glucose, Bld 79 70 - 99 mg/dL    Comment: Glucose reference range applies only to samples taken after fasting for at least  8 hours.   BUN 25 (H) 6 - 20 mg/dL   Creatinine, Ser 9.60 0.61 - 1.24 mg/dL   Calcium 9.6 8.9 - 45.4 mg/dL   Total Protein 8.9 (H) 6.5 - 8.1 g/dL   Albumin 3.4 (L) 3.5 - 5.0 g/dL   AST 35 15 - 41 U/L   ALT 13 0 - 44 U/L   Alkaline Phosphatase 143 (H) 38 - 126 U/L   Total Bilirubin 0.8 <1.2 mg/dL   GFR, Estimated >09 >81 mL/min    Comment: (NOTE) Calculated using the CKD-EPI Creatinine Equation (2021)    Anion gap 12 5 - 15    Comment: Performed at [facility], [address], [place], [place] [address]  Lipase, blood     Status: None   Collection Time: 06/19/23  5:00 PM  Result Value Ref Range   Lipase 33 11 - 51 U/L    Comment: Performed at [facility], [address] [place], [place] [address]  Troponin I (High Sensitivity)     Status: Abnormal   Collection Time:  06/19/23  5:00 PM  Result Value Ref Range   Troponin I (High Sensitivity) 27 (H) <18 ng/L    Comment: (NOTE) Elevated high sensitivity troponin I (hsTnI) values and significant  changes across serial measurements may suggest ACS but many other  chronic and acute conditions are known to elevate hsTnI results.  Refer to the "Links" section for chest pain algorithms and additional  guidance. Performed at [facility], [address], [place], [place] [address]   CBC with Differential     Status: Abnormal   Collection Time: 06/19/23  5:00 PM  Result Value Ref Range   WBC 10.5 4.0 - 10.5 K/uL   RBC 4.32 4.22 - 5.81 MIL/uL   Hemoglobin 12.8 (L) 13.0 - 17.0 g/dL   HCT 65.7 84.6 - 96.2 %   MCV 94.2 80.0 - 100.0 fL   MCH 29.6 26.0 - 34.0 pg   MCHC 31.4 30.0 - 36.0 g/dL   RDW 95.2 84.1 - 32.4 %   Platelets 483 (H) 150 - 400 K/uL   nRBC 0.0 0.0 - 0.2 %   Neutrophils Relative % 76 %   Neutro Abs 8.0 (H) 1.7 - 7.7 K/uL   Lymphocytes Relative 12 %   Lymphs Abs 1.3 0.7 - 4.0 K/uL   Monocytes Relative 6 %   Monocytes Absolute 0.6 0.1 - 1.0 K/uL   Eosinophils Relative 4 %   Eosinophils Absolute 0.5 0.0 - 0.5 K/uL   Basophils Relative 1 %   Basophils Absolute 0.1 0.0 - 0.1 K/uL   Immature Granulocytes 1 %   Abs Immature Granulocytes 0.05 0.00 - 0.07 K/uL    Comment: Performed at [facility], [address], [place], [place] [address]  Ethanol     Status: None   Collection Time: 06/19/23  5:00 PM  Result Value Ref Range   Alcohol, Ethyl (B) <10 <10 mg/dL    Comment: (NOTE) Lowest detectable limit for serum alcohol is 10 mg/dL.  For medical purposes only. Performed at [facility], [address] [place], [place] [address]   Brain natriuretic peptide     Status: Abnormal   Collection Time: 06/19/23  5:00 PM  Result Value Ref Range   B Natriuretic Peptide 1,415.7 (H) 0.0 - 100.0 pg/mL    Comment: Performed at [facility], [address], [place], [place] [address]  Resp panel by RT-PCR (RSV, Flu A&B, Covid) Anterior Nasal Swab     Status: None   Collection Time: 06/19/23  5:01 PM   Specimen: Anterior Nasal Swab  Result Value Ref Range   SARS Coronavirus 2 by RT PCR NEGATIVE NEGATIVE    Comment: (NOTE) SARS-CoV-2 target nucleic acids are NOT DETECTED.  The SARS-CoV-2 RNA is generally detectable in upper respiratory specimens during the acute phase of infection. The lowest concentration of SARS-CoV-2 viral copies this assay can detect is 138 copies/mL. A negative result does not preclude SARS-Cov-2 infection and should not be used as the sole basis for treatment or other patient management decisions. A negative result may occur with  improper specimen collection/handling, submission of specimen other than nasopharyngeal swab, presence of viral mutation(s) within the areas targeted by this assay, and inadequate number of viral copies(<138 copies/mL). A negative result must be combined with clinical observations, patient history, and epidemiological information. The expected result is Negative.  Fact Sheet for Patients:  BloggerCourse.com  Fact Sheet for Healthcare Providers:  SeriousBroker.it  This test is no t yet approved or cleared by the [place] FDA and  has been authorized for detection and/or diagnosis of SARS-CoV-2 by FDA under an Emergency Use Authorization (EUA). This EUA will remain  in effect (meaning this test can be used) for the duration of the COVID-19 declaration under Section 564(b)(1) of the Act, 21 U.S.C.section 360bbb-3(b)(1), unless the authorization is terminated  or revoked sooner.       Influenza A by PCR NEGATIVE NEGATIVE   Influenza B by PCR NEGATIVE NEGATIVE    Comment: (NOTE) The Xpert Xpress SARS-CoV-2/FLU/RSV plus assay is intended as an aid in the diagnosis of influenza from Nasopharyngeal  swab specimens and should not be used as a sole basis for treatment. Nasal washings and aspirates are unacceptable for Xpert Xpress SARS-CoV-2/FLU/RSV testing.  Fact Sheet for Patients: BloggerCourse.com  Fact Sheet for Healthcare Providers: SeriousBroker.it  This test is not yet approved or cleared by the [place] FDA and has been authorized for detection and/or diagnosis of SARS-CoV-2 by FDA under an Emergency Use Authorization (EUA). This EUA will remain in effect (meaning this test can be used) for the duration of the COVID-19 declaration under Section 564(b)(1) of the Act, 21 U.S.C. section 360bbb-3(b)(1), unless the authorization is terminated or revoked.     Resp Syncytial Virus by PCR NEGATIVE NEGATIVE    Comment: (NOTE) Fact Sheet for Patients: BloggerCourse.com  Fact Sheet for Healthcare Providers: SeriousBroker.it  This test is not yet approved or cleared by the [place] FDA and has been authorized for detection and/or diagnosis of SARS-CoV-2 by FDA under an Emergency Use Authorization (EUA). This EUA will remain in effect (meaning this test can be used) for the duration of the COVID-19 declaration under Section 564(b)(1) of the Act, 21 U.S.C. section 360bbb-3(b)(1), unless the authorization is terminated or revoked.  Performed at [facility], [address], [place], [place] [address]   Urine rapid drug screen (hosp performed)     Status: Abnormal   Collection Time: 06/19/23  5:46 PM  Result Value Ref Range   Opiates NONE DETECTED NONE DETECTED   Cocaine POSITIVE (A) NONE DETECTED   Benzodiazepines NONE DETECTED NONE DETECTED   Amphetamines NONE DETECTED NONE DETECTED   Tetrahydrocannabinol NONE DETECTED NONE DETECTED   Barbiturates NONE DETECTED NONE DETECTED    Comment: (NOTE) DRUG SCREEN FOR MEDICAL PURPOSES ONLY.  IF  CONFIRMATION IS NEEDED FOR  ANY PURPOSE, NOTIFY LAB WITHIN 5 DAYS.  LOWEST DETECTABLE LIMITS FOR URINE DRUG SCREEN Drug Class                     Cutoff (ng/mL) Amphetamine and metabolites    1000 Barbiturate and metabolites    200 Benzodiazepine                 200 Opiates and metabolites        300 Cocaine and metabolites        300 THC                            50 Performed at [facility], [address] [place], [place] [address]   Troponin I (High Sensitivity)     Status: Abnormal   Collection Time: 06/19/23  7:50 PM  Result Value Ref Range   Troponin I (High Sensitivity) 31 (H) <18 ng/L    Comment: (NOTE) Elevated high sensitivity troponin I (hsTnI) values and significant  changes across serial measurements may suggest ACS but many other  chronic and acute conditions are known to elevate hsTnI results.  Refer to the "Links" section for chest pain algorithms and additional  guidance. Performed at [facility], [address], [place], [place] [address]   Lactic acid, plasma     Status: None   Collection Time: 06/20/23 12:30 AM  Result Value Ref Range   Lactic Acid, Venous 1.4 0.5 - 1.9 mmol/L    Comment: Performed at [facility], [address], [place], [place] [address]  Procalcitonin     Status: None   Collection Time: 06/20/23  5:12 AM  Result Value Ref Range   Procalcitonin <0.10 ng/mL    Comment:        Interpretation: PCT (Procalcitonin) <= 0.5 ng/mL: Systemic infection (sepsis) is not likely. Local bacterial infection is possible. (NOTE)       Sepsis PCT Algorithm           Lower Respiratory Tract                                      Infection PCT Algorithm    ----------------------------     ----------------------------         PCT < 0.25 ng/mL                PCT < 0.10 ng/mL          Strongly encourage             Strongly discourage   discontinuation of antibiotics    initiation of  antibiotics    ----------------------------     -----------------------------       PCT 0.25 - 0.50 ng/mL            PCT 0.10 - 0.25 ng/mL               OR       >80% decrease in PCT            Discourage initiation of                                            antibiotics  Encourage discontinuation           of antibiotics    ----------------------------     -----------------------------         PCT >= 0.50 ng/mL              PCT 0.26 - 0.50 ng/mL               AND        <80% decrease in PCT             Encourage initiation of                                             antibiotics       Encourage continuation           of antibiotics    ----------------------------     -----------------------------        PCT >= 0.50 ng/mL                  PCT > 0.50 ng/mL               AND         increase in PCT                  Strongly encourage                                      initiation of antibiotics    Strongly encourage escalation           of antibiotics                                     -----------------------------                                           PCT <= 0.25 ng/mL                                                 OR                                        > 80% decrease in PCT                                      Discontinue / Do not initiate                                             antibiotics  Performed at [facility], [address] [place], [place] [address]   CBC     Status: Abnormal   Collection Time: 06/20/23  5:12 AM  Result Value  Ref Range   WBC 8.2 4.0 - 10.5 K/uL   RBC 3.39 (L) 4.22 - 5.81 MIL/uL   Hemoglobin 9.8 (L) 13.0 - 17.0 g/dL   HCT 91.4 (L) 78.2 - 95.6 %   MCV 91.7 80.0 - 100.0 fL   MCH 28.9 26.0 - 34.0 pg   MCHC 31.5 30.0 - 36.0 g/dL   RDW 21.3 08.6 - 57.8 %   Platelets 499 (H) 150 - 400 K/uL   nRBC 0.0 0.0 - 0.2 %    Comment: Performed at [facility], [address], [place], [place]  [address]  Comprehensive metabolic panel     Status: Abnormal   Collection Time: 06/20/23  5:12 AM  Result Value Ref Range   Sodium 138 135 - 145 mmol/L   Potassium 4.5 3.5 - 5.1 mmol/L   Chloride 104 98 - 111 mmol/L   CO2 25 22 - 32 mmol/L   Glucose, Bld 88 70 - 99 mg/dL    Comment: Glucose reference range applies only to samples taken after fasting for at least 8 hours.   BUN 26 (H) 6 - 20 mg/dL   Creatinine, Ser 9.52 (H) 0.61 - 1.24 mg/dL   Calcium 9.1 8.9 - 84.1 mg/dL   Total Protein 7.1 6.5 - 8.1 g/dL   Albumin 2.7 (L) 3.5 - 5.0 g/dL   AST 23 15 - 41 U/L   ALT 11 0 - 44 U/L   Alkaline Phosphatase 116 38 - 126 U/L   Total Bilirubin 0.9 <1.2 mg/dL   GFR, Estimated >32 >44 mL/min    Comment: (NOTE) Calculated using the CKD-EPI Creatinine Equation (2021)    Anion gap 9 5 - 15    Comment: Performed at [facility], [address] [place], [place] [address]   CT Angio Chest PE W and/or Wo Contrast  Result Date: 06/19/2023 CLINICAL DATA:  Pulmonary embolism suspected. High probability, with shortness of breath. EXAM: CT ANGIOGRAPHY CHEST WITH CONTRAST TECHNIQUE: Multidetector CT imaging of the chest was performed using the standard protocol during bolus administration of intravenous contrast. Multiplanar CT image reconstructions and MIPs were obtained to evaluate the vascular anatomy. RADIATION DOSE REDUCTION: This exam was performed according to the departmental dose-optimization program which includes automated exposure control, adjustment of the mA and/or kV according to patient size and/or use of iterative reconstruction technique. CONTRAST:  75mL OMNIPAQUE IOHEXOL 350 MG/ML SOLN COMPARISON:  AP Lat chest today, CTA chest 06/17/2023, CTA chest 06/04/2023. FINDINGS: Cardiovascular: There is moderate panchamber cardiomegaly with minimal pericardial effusion again noted. Once again there is IVC and hepatic vein contrast reflux most likely indicating ongoing right heart  dysfunction versus tricuspid regurgitation. The pulmonary trunk is slightly prominent, as before, without evidence of arterial embolism. The aorta and great vessels are unremarkable. There are no visible coronary calcifications. Central pulmonary veins are distended but no more than previously. Mediastinum/Nodes: Subcarinal nodal mass again measures 2.3 cm in short axis. Less enlarged lymph nodes at the AP window up to 1.2 cm in short axis. There scattered slightly prominent hilar lymph nodes bilaterally. The thyroid gland, thoracic trachea, thoracic esophagus are unremarkable. No new or progressive adenopathy.  Axillary spaces are clear. Lungs/Pleura: Persistent diffuse bronchial thickening. Similar appearance interlobular septal thickening in the lung bases consistent with mild interstitial edema. Small right and minimal left layering pleural effusions are similar. Consolidation continues to be noted in the anterior and posterior basal segments of the right lower lobe and lateral basal segment  of the left lower lobe. The left lower lobe opacity appears more hypodense which suggest liquefaction necrosis. There is improved atelectasis in the lingular base. Persisting mosaicism of the lung fields which is either due to small airways disease with air trapping or ground-glass edema. There are paraseptal emphysematous changes in the upper lobes. Scattered linear scarring or atelectasis in the bases. There is no new infiltrate. Upper Abdomen: No acute abnormality.  Hepatic steatosis. Musculoskeletal: There is thoracic kyphodextroscoliosis and mild degenerative changes. No acute or significant osseous findings are otherwise seen. Review of the MIP images confirms the above findings. IMPRESSION: 1. No evidence of arterial embolism. Slight prominence of the pulmonary trunk. 2. Cardiomegaly with minimal pericardial effusion, IVC and hepatic vein contrast reflux the latter most likely indicating ongoing right heart  dysfunction versus tricuspid regurgitation. 3. Persisting interlobular septal thickening in the lung bases consistent with mild interstitial edema. 4. Small right and minimal left layering pleural effusions are similar. 5. Consolidation in the anterior and posterior basal segments of the right lower lobe and lateral basal segment of the left lower lobe. The left lower lobe opacity appears more hypodense which suggests liquefaction necrosis. 6. Persisting mosaicism of the lung fields which is either due to small airways disease with air trapping or ground-glass edema. 7. Emphysema. Redemonstrated bronchial thickening which could be bronchitis or congestive. 8. Stable mediastinal and hilar adenopathy. 9. Hepatic steatosis. Emphysema (ICD10-J43.9). Electronically Signed   By: Almira Bar M.D.   On: 06/19/2023 21:13   DG Chest 2 View  Result Date: 06/19/2023 CLINICAL DATA:  Chest pain and weakness. EXAM: CHEST - 2 VIEW COMPARISON:  06/16/2023 FINDINGS: Moderate cardiomegaly again demonstrated. Tiny bilateral pleural effusions are noted. Increased airspace opacity seen in the right lower lobe, suspicious for pneumonia. IMPRESSION: Increased right lower lobe airspace opacity, suspicious for pneumonia. Tiny bilateral pleural effusions. Stable cardiomegaly. Electronically Signed   By: Danae Orleans M.D.   On: 06/19/2023 16:53    Pending Labs Unresulted Labs (From admission, onward)     Start     Ordered   06/20/23 0012  Expectorated Sputum Assessment w Gram Stain, Rflx to Resp Cult  Once,   R        06/20/23 0011   06/20/23 0012  MRSA Next Gen by PCR, Nasal  Once,   R        06/20/23 0011   06/20/23 0011  Culture, blood (Routine X 2) w Reflex to ID Panel  BLOOD CULTURE X 2,   R (with TIMED occurrences)      06/20/23 0011            Vitals/Pain Today's Vitals   06/20/23 0810 06/20/23 0930 06/20/23 1100 06/20/23 1207  BP:  (!) 122/93 (!) 121/91   Pulse:  (!) 101 (!) 101   Resp:  20 18   Temp:  97.6 F (36.4 C)   (!) 97.4 F (36.3 C)  TempSrc: Oral   Oral  SpO2:  96% 97%   Weight:      Height:      PainSc:        Isolation Precautions No active isolations  Medications Medications  furosemide (LASIX) injection 40 mg (40 mg Intravenous Given 06/20/23 0910)  acetaminophen (TYLENOL) tablet 650 mg (has no administration in time range)    Or  acetaminophen (TYLENOL) suppository 650 mg (has no administration in time range)  senna-docusate (Senokot-S) tablet 1 tablet (has no administration in time range)  ondansetron (ZOFRAN) tablet 4  mg (has no administration in time range)    Or  ondansetron (ZOFRAN) injection 4 mg (has no administration in time range)  apixaban (ELIQUIS) tablet 5 mg (5 mg Oral Given 06/20/23 0910)  atorvastatin (LIPITOR) tablet 40 mg (40 mg Oral Given 06/20/23 0910)  carvedilol (COREG) tablet 3.125 mg (3.125 mg Oral Given 06/20/23 0910)  clopidogrel (PLAVIX) tablet 75 mg (75 mg Oral Given 06/20/23 0910)  lidocaine (LIDODERM) 5 % 1 patch (1 patch Transdermal Patch Removed 06/20/23 1053)  guaiFENesin (MUCINEX) 12 hr tablet 600 mg (600 mg Oral Given 06/20/23 0910)  ipratropium-albuterol (DUONEB) 0.5-2.5 (3) MG/3ML nebulizer solution 3 mL (has no administration in time range)  umeclidinium bromide (INCRUSE ELLIPTA) 62.5 MCG/ACT 1 puff (1 puff Inhalation Given 06/20/23 0811)  ceFEPIme (MAXIPIME) 2 g in sodium chloride 0.9 % 100 mL IVPB (2 g Intravenous New Bag/Given 06/20/23 1053)  vancomycin (VANCOREADY) IVPB 1250 mg/250 mL (has no administration in time range)  iohexol (OMNIPAQUE) 350 MG/ML injection 75 mL (75 mLs Intravenous Contrast Given 06/19/23 1959)  vancomycin (VANCOCIN) IVPB 1000 mg/200 mL premix (0 mg Intravenous Stopped 06/20/23 0236)  ceFEPIme (MAXIPIME) 2 g in sodium chloride 0.9 % 100 mL IVPB (0 g Intravenous Stopped 06/20/23 0147)  ipratropium-albuterol (DUONEB) 0.5-2.5 (3) MG/3ML nebulizer solution 3 mL (3 mLs Nebulization Given 06/20/23 0057)     Mobility walks with person assist

## 2023-06-20 NOTE — ED Notes (Signed)
Pt has a condom cath on.

## 2023-06-20 NOTE — Plan of Care (Signed)
  Problem: Education: Goal: Knowledge of General Education information will improve Description: Including pain rating scale, medication(s)/side effects and non-pharmacologic comfort measures Outcome: Progressing   Problem: Clinical Measurements: Goal: Ability to maintain clinical measurements within normal limits will improve Outcome: Progressing Goal: Will remain free from infection Outcome: Progressing Goal: Respiratory complications will improve Outcome: Progressing Goal: Cardiovascular complication will be avoided Outcome: Progressing   Problem: Activity: Goal: Risk for activity intolerance will decrease Outcome: Progressing   Problem: Nutrition: Goal: Adequate nutrition will be maintained Outcome: Progressing   Problem: Coping: Goal: Level of anxiety will decrease Outcome: Progressing   Problem: Elimination: Goal: Will not experience complications related to bowel motility Outcome: Progressing Goal: Will not experience complications related to urinary retention Outcome: Progressing   Problem: Pain Management: Goal: General experience of comfort will improve Outcome: Progressing   Problem: Safety: Goal: Ability to remain free from injury will improve Outcome: Progressing   Problem: Skin Integrity: Goal: Risk for impaired skin integrity will decrease Outcome: Progressing

## 2023-06-20 NOTE — Progress Notes (Signed)
PROGRESS NOTE    [name]  ZOX:[id number] DOB: 07-14-1971 DOA: 06/19/2023 PCP: Rema Fendt, NP  Chief Complaint  Patient presents with   Chest Pain    Hospital Course:  Craig Schmidt is 52 y.o. male with a complicated past medical history complicated by homelessness for 30 years.  Patient has history of cocaine abuse, alcohol abuse, medication nonadherence, CAD, hypertension, hyperlipidemia, cardiomyopathy, prior NSTEMI, heart failure with reduced EF, prior CVA, hyperlipidemia, CKD stage IIIA who presented to the ED on this visit complaining of chest pain.  On arrival to the ED he was tachycardic and tachypneic but normotensive.  BNP elevated at 1415, UDS positive for cocaine.  Chest x-ray with increased right lower lobe airspace opacity suspicious for pneumonia as well as tiny bilateral pleural effusions and stable cardiomyopathy.  CTA PE negative for PE but did demonstrate mild interstitial edema, small pleural effusions, consolidation of the basal segments of the RLL and LLL.  There is also notable concern for liquefaction necrosis in the left lower lobe.  Patient has had multiple ED visits over the last 3 weeks.  Recent visits as follows: 11/2 admitted for multifocal pneumonia and left AMA on 11/8 11/12 admitted for sepsis secondary to multifocal pneumonia and acute hypoxic respiratory failure.  Discharged 11/18. 11/19 admitted for persistent multifocal pneumonia and discharged on 11/21. 11/24 presented to ED for right flank pain and discharged home from ED.  Subjective: Patient is complaining of pain with deep inspiration.  Also complaining of cough.  Remains on room air without evidence of desaturation.    Objective: Vitals:   06/20/23 0600 06/20/23 0630 06/20/23 0800 06/20/23 0810  BP: (!) 124/97 (!) 123/96 122/86   Pulse: 95 (!) 101 (!) 102   Resp: 20 19 20    Temp:    97.6 F (36.4 C)  TempSrc:    Oral  SpO2: 98% 99% 97%   Weight:      Height:         Intake/Output Summary (Last 24 hours) at 06/20/2023 0916 Last data filed at 06/20/2023 0347 Gross per 24 hour  Intake 120 ml  Output 1300 ml  Net -1180 ml   Filed Weights   06/19/23 1600  Weight: 60.9 kg    Examination: General exam: Appears calm and comfortable, NAD Respiratory system: No work of breathing, symmetric chest wall expansion.  Shallow respirations bilaterally with decreased aeration on the left. Cardiovascular system: S1 & S2 heard, RRR.  Gastrointestinal system: Abdomen is nondistended, soft and nontender.  Neuro: Alert and oriented. No focal neurological deficits. Extremities: Symmetric, expected ROM Skin: No rashes, lesions Psychiatry: Demonstrates appropriate judgement and insight. Mood & affect appropriate for situation.   Assessment & Plan:  Principal Problem:   Multifocal pneumonia  Sepsis secondary to multifocal pneumonia Left lower lobe liquefication necrosis -Multiple prior admissions for multifocal pneumonia.  Appeared pneumonia was improving due to resolution of leukocytosis and no fevers however now with possible liquefication necrosis in the left lower lung base. - Patient initially started on vancomycin and cefepime - Pulmonology consulted, recommend switch to Unasyn.  If patient tolerates Unasyn for 48 hours with no additional fever can be switched to p.o. Augmentin.  Will need prolonged course of antibiotics for at least 3 weeks and follow-up CT scan outpatient to determine final antibiotic duration.  If he does not improve, pulmonology to consider bronchoscopy. -  ID consulted as well. - Continue symptomatic support, Mucinex daily - Trend fever curve and CBC - Follow-up  blood cultures, sputum cultures, MRSA screen  Chest wall pain - Likely secondary to costochondritis/pleuritic chest pain given the above findings.  Worse with cough and deep inspiration - Encourage incentive spirometer and flutter valve - Lidocaine patch and pain management  as needed  Chronic combined systolic and diastolic heart failure - TTE 05/27/2023: EF less than 20%, LV global hypokinesis, grade 2 diastolic dysfunction, moderate to severe LA dilation and moderate to severe TR. - Poor medication compliance - BNP elevation this admission 1415, which is slightly elevated compared to 5 days ago - Remains on room air - Continue with IV diuresis, transition to p.o. when able - Follow strict I's and O's - Continue home meds  CKD stage IIIa Hyperkalemia - Baseline creatinine appears to be 1.2-1.4.  Appears close to baseline currently - Proceed with Lokelma and shifters if potassium remains elevated - Avoid nephrotoxic meds - Renally dose were needed - Trend CMP  Hypertension - Resume home dose Coreg  Emphysema - CT chest on admission reveals evidence of emphysema, mild respiratory wheezing on exam - Continue Spiriva daily inhaler - As needed DuoNebs  History of CVA History of CAD History of NSTEMI - Continue home dose Eliquis, Plavix, statin  Polysubstance abuse - Ongoing crack cocaine abuse.  Last crack smoked yesterday - UDS positive on arrival. - Patient has been counseled extensively on dangers of continuing. - TOC consult to assist with substance use resources  Homelessness - TOC consulted, appreciate assistance   DVT prophylaxis: Eliquis Code Status: Full Family Communication: none at bedside. Communicated directly with pt Disposition:  Status is: Inpatient for IV Abx. High risk of decompensation    Consultants:  Pulm ID  Procedures:  N/a  Antimicrobials:  Anti-infectives (From admission, onward)    Start     Dose/Rate Route Frequency Ordered Stop   06/20/23 0015  vancomycin (VANCOCIN) IVPB 1000 mg/200 mL premix        1,000 mg 200 mL/hr over 60 Minutes Intravenous  Once 06/20/23 0011 06/20/23 0236   06/20/23 0015  ceFEPIme (MAXIPIME) 2 g in sodium chloride 0.9 % 100 mL IVPB        2 g 200 mL/hr over 30 Minutes  Intravenous  Once 06/20/23 0011 06/20/23 0147       Data Reviewed: I have personally reviewed following labs and imaging studies CBC: Recent Labs  Lab 06/13/23 1210 06/16/23 2334 06/19/23 1700 06/20/23 0512  WBC 13.8* 11.1* 10.5 8.2  NEUTROABS  --  7.6 8.0*  --   HGB 10.5* 11.5* 12.8* 9.8*  HCT 32.5* 36.7* 40.7 31.1*  MCV 92.6 92.4 94.2 91.7  PLT 650* 647* 483* 499*   Basic Metabolic Panel: Recent Labs  Lab 06/13/23 1210 06/16/23 2334 06/19/23 1700 06/20/23 0512  NA 136 137 141 138  K 4.4 4.4 5.4* 4.5  CL 103 103 106 104  CO2 25 26 23 25   GLUCOSE 101* 102* 79 88  BUN 30* 27* 25* 26*  CREATININE 1.21 1.35* 1.24 1.28*  CALCIUM 8.7* 8.9 9.6 9.1   GFR: Estimated Creatinine Clearance: 59.5 mL/min (A) (by C-G formula based on SCr of 1.28 mg/dL (H)). Liver Function Tests: Recent Labs  Lab 06/16/23 2334 06/19/23 1700 06/20/23 0512  AST 34 35 23  ALT 15 13 11   ALKPHOS 152* 143* 116  BILITOT 0.5 0.8 0.9  PROT 8.4* 8.9* 7.1  ALBUMIN 3.0* 3.4* 2.7*   CBG: No results for input(s): "GLUCAP" in the last 168 hours.  Recent Results (from the  past 240 hour(s))  SARS Coronavirus 2 by RT PCR (hospital order, performed in [facility] hospital lab) *cepheid single result test* Anterior Nasal Swab     Status: None   Collection Time: 06/11/23 12:39 AM   Specimen: Anterior Nasal Swab  Result Value Ref Range Status   SARS Coronavirus 2 by RT PCR NEGATIVE NEGATIVE Final    Comment: (NOTE) SARS-CoV-2 target nucleic acids are NOT DETECTED.  The SARS-CoV-2 RNA is generally detectable in upper and lower respiratory specimens during the acute phase of infection. The lowest concentration of SARS-CoV-2 viral copies this assay can detect is 250 copies / mL. A negative result does not preclude SARS-CoV-2 infection and should not be used as the sole basis for treatment or other patient management decisions.  A negative result may occur with improper specimen collection / handling,  submission of specimen other than nasopharyngeal swab, presence of viral mutation(s) within the areas targeted by this assay, and inadequate number of viral copies (<250 copies / mL). A negative result must be combined with clinical observations, patient history, and epidemiological information.  Fact Sheet for Patients:   RoadLapTop.co.za  Fact Sheet for Healthcare Providers: http://kim-miller.com/  This test is not yet approved or  cleared by the [place] FDA and has been authorized for detection and/or diagnosis of SARS-CoV-2 by FDA under an Emergency Use Authorization (EUA).  This EUA will remain in effect (meaning this test can be used) for the duration of the COVID-19 declaration under Section 564(b)(1) of the Act, 21 U.S.C. section 360bbb-3(b)(1), unless the authorization is terminated or revoked sooner.  Performed at [facility], [address], [place], [place] [address]   Respiratory (~20 pathogens) panel by PCR     Status: None   Collection Time: 06/12/23  4:26 PM   Specimen: Nasopharyngeal Swab; Respiratory  Result Value Ref Range Status   Adenovirus NOT DETECTED NOT DETECTED Final   Coronavirus 229E NOT DETECTED NOT DETECTED Final    Comment: (NOTE) The Coronavirus on the Respiratory Panel, DOES NOT test for the novel  Coronavirus (2019 nCoV)    Coronavirus HKU1 NOT DETECTED NOT DETECTED Final   Coronavirus NL63 NOT DETECTED NOT DETECTED Final   Coronavirus OC43 NOT DETECTED NOT DETECTED Final   Metapneumovirus NOT DETECTED NOT DETECTED Final   Rhinovirus / Enterovirus NOT DETECTED NOT DETECTED Final   Influenza A NOT DETECTED NOT DETECTED Final   Influenza B NOT DETECTED NOT DETECTED Final   Parainfluenza Virus 1 NOT DETECTED NOT DETECTED Final   Parainfluenza Virus 2 NOT DETECTED NOT DETECTED Final   Parainfluenza Virus 3 NOT DETECTED NOT DETECTED Final   Parainfluenza Virus 4 NOT DETECTED  NOT DETECTED Final   Respiratory Syncytial Virus NOT DETECTED NOT DETECTED Final   Bordetella pertussis NOT DETECTED NOT DETECTED Final   Bordetella Parapertussis NOT DETECTED NOT DETECTED Final   Chlamydophila pneumoniae NOT DETECTED NOT DETECTED Final   Mycoplasma pneumoniae NOT DETECTED NOT DETECTED Final    Comment: Performed at [facility], [address] [place], [place] [address]  Resp panel by RT-PCR (RSV, Flu A&B, Covid) Anterior Nasal Swab     Status: None   Collection Time: 06/19/23  5:01 PM   Specimen: Anterior Nasal Swab  Result Value Ref Range Status   SARS Coronavirus 2 by RT PCR NEGATIVE NEGATIVE Final    Comment: (NOTE) SARS-CoV-2 target nucleic acids are NOT DETECTED.  The SARS-CoV-2 RNA is generally detectable in upper respiratory specimens during  the acute phase of infection. The lowest concentration of SARS-CoV-2 viral copies this assay can detect is 138 copies/mL. A negative result does not preclude SARS-Cov-2 infection and should not be used as the sole basis for treatment or other patient management decisions. A negative result may occur with  improper specimen collection/handling, submission of specimen other than nasopharyngeal swab, presence of viral mutation(s) within the areas targeted by this assay, and inadequate number of viral copies(<138 copies/mL). A negative result must be combined with clinical observations, patient history, and epidemiological information. The expected result is Negative.  Fact Sheet for Patients:  BloggerCourse.com  Fact Sheet for Healthcare Providers:  SeriousBroker.it  This test is no t yet approved or cleared by the [place] FDA and  has been authorized for detection and/or diagnosis of SARS-CoV-2 by FDA under an Emergency Use Authorization (EUA). This EUA will remain  in effect (meaning this test can be used) for the duration of the COVID-19 declaration under  Section 564(b)(1) of the Act, 21 U.S.C.section 360bbb-3(b)(1), unless the authorization is terminated  or revoked sooner.       Influenza A by PCR NEGATIVE NEGATIVE Final   Influenza B by PCR NEGATIVE NEGATIVE Final    Comment: (NOTE) The Xpert Xpress SARS-CoV-2/FLU/RSV plus assay is intended as an aid in the diagnosis of influenza from Nasopharyngeal swab specimens and should not be used as a sole basis for treatment. Nasal washings and aspirates are unacceptable for Xpert Xpress SARS-CoV-2/FLU/RSV testing.  Fact Sheet for Patients: BloggerCourse.com  Fact Sheet for Healthcare Providers: SeriousBroker.it  This test is not yet approved or cleared by the [place] FDA and has been authorized for detection and/or diagnosis of SARS-CoV-2 by FDA under an Emergency Use Authorization (EUA). This EUA will remain in effect (meaning this test can be used) for the duration of the COVID-19 declaration under Section 564(b)(1) of the Act, 21 U.S.C. section 360bbb-3(b)(1), unless the authorization is terminated or revoked.     Resp Syncytial Virus by PCR NEGATIVE NEGATIVE Final    Comment: (NOTE) Fact Sheet for Patients: BloggerCourse.com  Fact Sheet for Healthcare Providers: SeriousBroker.it  This test is not yet approved or cleared by the [place] FDA and has been authorized for detection and/or diagnosis of SARS-CoV-2 by FDA under an Emergency Use Authorization (EUA). This EUA will remain in effect (meaning this test can be used) for the duration of the COVID-19 declaration under Section 564(b)(1) of the Act, 21 U.S.C. section 360bbb-3(b)(1), unless the authorization is terminated or revoked.  Performed at [facility], [address] [place], [place] [address]      Radiology Studies: CT Angio Chest PE W and/or Wo Contrast  Result Date:  06/19/2023 CLINICAL DATA:  Pulmonary embolism suspected. High probability, with shortness of breath. EXAM: CT ANGIOGRAPHY CHEST WITH CONTRAST TECHNIQUE: Multidetector CT imaging of the chest was performed using the standard protocol during bolus administration of intravenous contrast. Multiplanar CT image reconstructions and MIPs were obtained to evaluate the vascular anatomy. RADIATION DOSE REDUCTION: This exam was performed according to the departmental dose-optimization program which includes automated exposure control, adjustment of the mA and/or kV according to patient size and/or use of iterative reconstruction technique. CONTRAST:  75mL OMNIPAQUE IOHEXOL 350 MG/ML SOLN COMPARISON:  AP Lat chest today, CTA chest 06/17/2023, CTA chest 06/04/2023. FINDINGS: Cardiovascular: There is moderate panchamber cardiomegaly with minimal pericardial effusion again noted. Once again there is IVC and hepatic vein contrast reflux most likely indicating ongoing right heart dysfunction  versus tricuspid regurgitation. The pulmonary trunk is slightly prominent, as before, without evidence of arterial embolism. The aorta and great vessels are unremarkable. There are no visible coronary calcifications. Central pulmonary veins are distended but no more than previously. Mediastinum/Nodes: Subcarinal nodal mass again measures 2.3 cm in short axis. Less enlarged lymph nodes at the AP window up to 1.2 cm in short axis. There scattered slightly prominent hilar lymph nodes bilaterally. The thyroid gland, thoracic trachea, thoracic esophagus are unremarkable. No new or progressive adenopathy.  Axillary spaces are clear. Lungs/Pleura: Persistent diffuse bronchial thickening. Similar appearance interlobular septal thickening in the lung bases consistent with mild interstitial edema. Small right and minimal left layering pleural effusions are similar. Consolidation continues to be noted in the anterior and posterior basal segments of the  right lower lobe and lateral basal segment of the left lower lobe. The left lower lobe opacity appears more hypodense which suggest liquefaction necrosis. There is improved atelectasis in the lingular base. Persisting mosaicism of the lung fields which is either due to small airways disease with air trapping or ground-glass edema. There are paraseptal emphysematous changes in the upper lobes. Scattered linear scarring or atelectasis in the bases. There is no new infiltrate. Upper Abdomen: No acute abnormality.  Hepatic steatosis. Musculoskeletal: There is thoracic kyphodextroscoliosis and mild degenerative changes. No acute or significant osseous findings are otherwise seen. Review of the MIP images confirms the above findings. IMPRESSION: 1. No evidence of arterial embolism. Slight prominence of the pulmonary trunk. 2. Cardiomegaly with minimal pericardial effusion, IVC and hepatic vein contrast reflux the latter most likely indicating ongoing right heart dysfunction versus tricuspid regurgitation. 3. Persisting interlobular septal thickening in the lung bases consistent with mild interstitial edema. 4. Small right and minimal left layering pleural effusions are similar. 5. Consolidation in the anterior and posterior basal segments of the right lower lobe and lateral basal segment of the left lower lobe. The left lower lobe opacity appears more hypodense which suggests liquefaction necrosis. 6. Persisting mosaicism of the lung fields which is either due to small airways disease with air trapping or ground-glass edema. 7. Emphysema. Redemonstrated bronchial thickening which could be bronchitis or congestive. 8. Stable mediastinal and hilar adenopathy. 9. Hepatic steatosis. Emphysema (ICD10-J43.9). Electronically Signed   By: Almira Bar M.D.   On: 06/19/2023 21:13   DG Chest 2 View  Result Date: 06/19/2023 CLINICAL DATA:  Chest pain and weakness. EXAM: CHEST - 2 VIEW COMPARISON:  06/16/2023 FINDINGS:  Moderate cardiomegaly again demonstrated. Tiny bilateral pleural effusions are noted. Increased airspace opacity seen in the right lower lobe, suspicious for pneumonia. IMPRESSION: Increased right lower lobe airspace opacity, suspicious for pneumonia. Tiny bilateral pleural effusions. Stable cardiomegaly. Electronically Signed   By: Danae Orleans M.D.   On: 06/19/2023 16:53    Scheduled Meds:  apixaban  5 mg Oral BID   atorvastatin  40 mg Oral Daily   carvedilol  3.125 mg Oral BID   clopidogrel  75 mg Oral Daily   furosemide  40 mg Intravenous Daily   guaiFENesin  600 mg Oral BID   lidocaine  1 patch Transdermal Q24H   umeclidinium bromide  1 puff Inhalation Daily   Continuous Infusions:   LOS: 0 days    Time spent:   Debarah Crape, DO [facility]  To contact the attending physician between 7A-7P please use Epic Chat. To contact the covering physician during after hours 7P-7A, please review Amion.   06/20/2023, 9:16 AM

## 2023-06-20 NOTE — Progress Notes (Signed)
ED Pharmacy Antibiotic Sign Off An antibiotic consult was received from an ED provider for vanc and cefepime per pharmacy dosing for sepsis. A chart review was completed to assess appropriateness.   The following one time order(s) were placed:  Vanc 1gm Cefepime 2gm  Further antibiotic and/or antibiotic pharmacy consults should be ordered by the admitting provider if indicated.   Thank you for allowing pharmacy to be a part of this patient's care.   Arley Phenix RPh 06/20/2023, 12:13 AM

## 2023-06-21 DIAGNOSIS — J189 Pneumonia, unspecified organism: Secondary | ICD-10-CM | POA: Diagnosis not present

## 2023-06-21 DIAGNOSIS — R0789 Other chest pain: Principal | ICD-10-CM

## 2023-06-21 DIAGNOSIS — R918 Other nonspecific abnormal finding of lung field: Secondary | ICD-10-CM

## 2023-06-21 MED ORDER — AMOXICILLIN-POT CLAVULANATE 875-125 MG PO TABS
1.0000 | ORAL_TABLET | Freq: Two times a day (BID) | ORAL | Status: DC
  Administered 2023-06-21 – 2023-06-22 (×3): 1 via ORAL
  Filled 2023-06-21 (×3): qty 1

## 2023-06-21 MED ORDER — ORAL CARE MOUTH RINSE: 15.0000 mL | OROMUCOSAL | Status: DC | PRN

## 2023-06-21 MED ORDER — FUROSEMIDE 40 MG PO TABS
40.0000 mg | ORAL_TABLET | Freq: Every day | ORAL | Status: DC
  Administered 2023-06-22: 40 mg via ORAL
  Filled 2023-06-21: qty 1

## 2023-06-21 NOTE — Progress Notes (Signed)
NAME:  Craig Schmidt, MRN:  [id number], DOB:  [birth year], LOS: 1 ADMISSION DATE:  06/19/2023, CONSULTATION DATE:  06/20/2023 REFERRING MD:  Dr Rennis Chris, CHIEF COMPLAINT: Lung abscess, necrotic pneumonia  History of Present Illness:  52 year old with recent multiple hospitalizations and ED visits for chest pain, chest discomfort, pneumonia.  Has had multiple courses of antibiotics recently. CT scan showing liquefaction necrosis at the bases of the lungs   Came in with chest discomfort Admits to a cough, not productive of any sputum May have had a fever which she did not measure a couple of days ago   Smokes about half a pack a day Last use of crack cocaine about 48 hours ago Last alcohol use about 48 hours ago   Admits to noncompliance because of his homeless status   Has had 5 ED visits recently admitted 11/2-11/8-treated for pneumonia with ceftriaxone, doxycycline, switched to Unasyn and doxycycline.  Was discharged on doxycycline but may not have completed course of treatment.  Signed out AMA Reevaluated 11/12 with chest discomfort, shortness of breath, was admitted and treated for pneumonia with a course of vancomycin and cefepime, course was switched to ampicillin and doxycycline and discontinued on 11/18 at discharge Presented back to the hospital on the same day of discharge with chest discomfort on 11/18-was admitted up until 11/21 and he was on vancomycin and cefepime during this hospitalization.  Was not discharged on any antibiotics. Reevaluated in the emergency department 11/24 with chest discomfort-was not admitted Presented again 11/27 and was admitted with chest discomfort.  Has had multiple CTs showing right basal pneumonia, interstitial changes, mosaic appearance of the lung   He does have chronic kidney disease, heart failure with reduced ejection fraction, coronary artery disease history of stroke, history of vestibular schwannoma, recent MRI showing cortical infarcts and  numerous additional remote infarcts with some volume loss  Pertinent  Medical History   Past Medical History:  Diagnosis Date   Alcohol abuse    Asthma    Brain tumor (HCC)    CAD (coronary artery disease)    Chronic HFrEF (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)    Chronic kidney disease, stage 3 (HCC)    Cocaine abuse (HCC)    History of medication noncompliance    Homelessness    MI (myocardial infarction) (HCC)    STEMI (ST elevation myocardial infarction) (HCC)    Stroke (HCC)      Significant Hospital Events: Including procedures, antibiotic start and stop dates in addition to other pertinent events   CT chest 11/27/2024IMPRESSION: 1. No evidence of arterial embolism. Slight prominence of the pulmonary trunk. 2. Cardiomegaly with minimal pericardial effusion, IVC and hepatic vein contrast reflux the latter most likely indicating ongoing right heart dysfunction versus tricuspid regurgitation. 3. Persisting interlobular septal thickening in the lung bases consistent with mild interstitial edema. 4. Small right and minimal left layering pleural effusions are similar. 5. Consolidation in the anterior and posterior basal segments of the right lower lobe and lateral basal segment of the left lower lobe. The left lower lobe opacity appears more hypodense which suggests liquefaction necrosis. 6. Persisting mosaicism of the lung fields which is either due to small airways disease with air trapping or ground-glass edema. 7. Emphysema. Redemonstrated bronchial thickening which could be bronchitis or congestive. 8. Stable mediastinal and hilar adenopathy. 9. Hepatic steatosis.  Interim History / Subjective:  Denies acute pain at present Denies having a fever at present  Objective   Blood pressure  115/80, pulse (!) 102, temperature 97.9 F (36.6 C), temperature source Oral, resp. rate 18, height 5\' 7"  (1.702 m), weight 62.3 kg, SpO2 98%.        Intake/Output Summary (Last 24  hours) at 06/21/2023 1041 Last data filed at 06/21/2023 1030 Gross per 24 hour  Intake 1560 ml  Output 2201 ml  Net -641 ml   Filed Weights   06/19/23 1600 06/20/23 1423  Weight: 60.9 kg 62.3 kg    Examination: General: Chronically ill-appearing HENT: Poor dental hygiene Lungs: Decreased air movement bilaterally Cardiovascular: S1-S2 appreciated Abdomen: Soft, bowel sounds appreciated Extremities: No clubbing, no edema Neuro: Awake and alert GU:   Resolved Hospital Problem list     Assessment & Plan:  Necrotic pneumonia bibasal lungs -Has had multiple courses of antibiotics as documented above -No leukocytosis -Pro-Cal less than 0.1  Currently on Unasyn I believe Unasyn can be discontinued today and started on Augmentin  Recommend Augmentin 875 twice daily for 28 days  Repeat CT scan of the chest in about 3 weeks  Obstructive lung disease -Continue bronchodilator treatment -Continue smoking cessation counseling  I did discuss with the patient again regarding drug use, alcohol use, smoking cessation, medication adherence  Social situation needs addressed as this may help his noncompliance  Significant risk of worsening if he remains noncompliant and goes back to using illicit substances  Pulmonary will sign off at present  He should be scheduled for follow-up in about 4 weeks with a CT scan of the chest in about 3 weeks to follow basal infiltrates  Virl Diamond, MD [place] [facility] Pager: See Loretha Stapler

## 2023-06-21 NOTE — TOC Initial Note (Signed)
Transition of Care [facility]) - Initial/Assessment Note    Patient Details  Name: Craig Schmidt MRN: [id number] Date of Birth: Mar 02, 1971  Transition of Care [facility]) CM/SW Contact:    Otelia Santee, LCSW Phone Number: 06/21/2023, 12:53 PM  Clinical Narrative:                 Reviewed SDOH concerns with pt. Pt currently homeless and has been sleeping on the streets. Pt is aware of shelters in the area however refuses to go to shelter due to it being dangerous. Pt agreeable to have resources for food, housing, transportation, and substance use be placed on AVS. Resources have been added to discharge instructions.   Expected Discharge Plan: Homeless Shelter Barriers to Discharge: No Barriers Identified   Patient Goals and CMS Choice Patient states their goals for this hospitalization and ongoing recovery are:: To get housing CMS Medicare.gov Compare Post Acute Care list provided to::  (NA) Choice offered to / list presented to : Patient [place] ownership interest in [facility]provided to::  (NA)    Expected Discharge Plan and Services In-house Referral: Clinical Social Work Discharge Planning Services: NA Post Acute Care Choice: NA Living arrangements for the past 2 months: Homeless                 DME Arranged: N/A DME Agency: NA                  Prior Living Arrangements/Services Living arrangements for the past 2 months: Homeless Lives with:: Significant Other Patient language and need for interpreter reviewed:: Yes Do you feel safe going back to the place where you live?: Yes      Need for Family Participation in Patient Care: No (Comment) Care giver support system in place?: No (comment) Current home services: DME (rollator) Criminal Activity/Legal Involvement Pertinent to Current Situation/Hospitalization: No - Comment as needed  Activities of Daily Living   ADL Screening (condition at time of admission) Independently performs ADLs?: No Does  the patient have a NEW difficulty with bathing/dressing/toileting/self-feeding that is expected to last >3 days?: No Does the patient have a NEW difficulty with getting in/out of bed, walking, or climbing stairs that is expected to last >3 days?: No Does the patient have a NEW difficulty with communication that is expected to last >3 days?: No Is the patient deaf or have difficulty hearing?: No Does the patient have difficulty seeing, even when wearing glasses/contacts?: Yes Does the patient have difficulty concentrating, remembering, or making decisions?: No  Permission Sought/Granted   Permission granted to share information with : No              Emotional Assessment Appearance:: Appears older than stated age Attitude/Demeanor/Rapport: Engaged Affect (typically observed): Accepting Orientation: : Oriented to Self, Oriented to Place, Oriented to  Time, Oriented to Situation Alcohol / Substance Use: Alcohol Use, Illicit Drugs Psych Involvement: No (comment)  Admission diagnosis:  Pulmonary infiltrate [R91.8] Atypical chest pain [R07.89] Multifocal pneumonia [J18.9] Pneumonia [J18.9] Patient Active Problem List   Diagnosis Date Noted   Atypical chest pain 06/21/2023   Pulmonary infiltrate 06/21/2023   Multifocal pneumonia 06/11/2023   Hyperkalemia 06/11/2023   Hyponatremia 06/11/2023   Normocytic anemia 06/11/2023   Thrombocytosis 06/11/2023   Troponin I above reference range 05/25/2023   CAP (community acquired pneumonia) 05/25/2023   Pneumonia 05/25/2023   Acute on chronic combined systolic and diastolic heart failure (HCC) 12/03/2022   Malnutrition of moderate degree 11/30/2022  Acute on chronic combined systolic and diastolic CHF (congestive heart failure) (HCC) 11/29/2022   Acute on chronic combined systolic (congestive) and diastolic (congestive) heart failure (HCC) 11/28/2022   ACS (acute coronary syndrome) (HCC) 11/28/2022   Back pain 11/28/2022   Essential  hypertension 11/28/2022   Dyslipidemia 11/28/2022   Noncompliance with medication regimen 11/28/2022   ST elevation myocardial infarction (STEMI) of inferolateral wall, subsequent episode of care (HCC) 12/19/2021   ST elevation myocardial infarction (STEMI) (HCC)    Ischemic cardiomyopathy    Cocaine abuse (HCC)    Smoker    Alcohol abuse    CVA (cerebral vascular accident) (HCC) 09/08/2021   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 [place] [address] Phone: [phone] Fax: [phone]  [id number] [place], [place] - [address] [place] [address] Phone: [phone] Fax: [phone]  [facility] - [place], [place] - [address] [place] [address] Phone: [phone] Fax: [phone]  [place] [facility] [address] [place] [address] Phone: [phone] Fax: [phone]     Social Determinants of Health (SDOH) Social History: SDOH Screenings   Food Insecurity: Food Insecurity Present (06/20/2023)  Housing: High Risk (06/20/2023)  Transportation Needs: Unmet Transportation Needs (06/20/2023)  Utilities: At Risk (06/20/2023)  Alcohol Screen: High Risk (09/19/2021)  Depression (PHQ2-9): High Risk (06/13/2022)  Financial Resource Strain: High Risk (09/07/2022)  Stress: Stress Concern  Present (10/16/2022)  Tobacco Use: High Risk (06/19/2023)   SDOH Interventions: Food Insecurity Interventions: Inpatient TOC, Other (Comment) (Resources added to AVS) Housing Interventions: Inpatient TOC, Other (Comment) (Resource added to AVS) Transportation Interventions: Inpatient TOC, Payor Benefit, Other (Comment) (Resource added to AVS) Utilities Interventions: Inpatient TOC, Intervention Not Indicated (Houseless. Has no utilities)   Readmission Risk Interventions    06/21/2023   12:50 PM  Readmission Risk Prevention Plan  Transportation Screening Complete  Medication Review (RN Care Manager) Complete  PCP or Specialist appointment within 3-5 days of discharge Complete  HRI or Home Care Consult Complete  SW Recovery Care/Counseling Consult Complete  Palliative Care Screening Not Applicable  Skilled Nursing Facility Not Applicable

## 2023-06-21 NOTE — Progress Notes (Signed)
PROGRESS NOTE    Craig Schmidt  WUJ:[id number] DOB: 01/30/1971 DOA: 06/19/2023 PCP: Rema Fendt, NP  Chief Complaint  Patient presents with   Chest Pain    Hospital Course:  Craig Schmidt is 53 y.o. male with a complicated past medical history complicated by homelessness for 30 years.  Patient has history of cocaine abuse, alcohol abuse, medication nonadherence, CAD, hypertension, hyperlipidemia, cardiomyopathy, prior NSTEMI, heart failure with reduced EF, prior CVA, hyperlipidemia, CKD stage IIIA who presented to the ED on this visit complaining of chest pain.  On arrival to the ED he was tachycardic and tachypneic but normotensive.  BNP elevated at 1415, UDS positive for cocaine.  Chest x-ray with increased right lower lobe airspace opacity suspicious for pneumonia as well as tiny bilateral pleural effusions and stable cardiomyopathy.  CTA PE negative for PE but did demonstrate mild interstitial edema, small pleural effusions, consolidation of the basal segments of the RLL and LLL.  There is also notable concern for liquefaction necrosis in the left lower lobe.  Patient has had multiple ED visits over the last 3 weeks.  Recent visits as follows: 11/2 admitted for multifocal pneumonia and left AMA on 11/8 11/12 admitted for sepsis secondary to multifocal pneumonia and acute hypoxic respiratory failure.  Discharged 11/18. 11/19 admitted for persistent multifocal pneumonia and discharged on 11/21. 11/24 presented to ED for right flank pain and discharged home from ED.  Subjective: Is very drowsy on arrival today, girlfriend is at bedside. Pt reports that he is still having pain with deep inspiration and irritating cough.     Objective: Vitals:   06/20/23 2300 06/20/23 2347 06/21/23 0000 06/21/23 0100  BP:      Pulse:      Resp: 18 18 18 18   Temp:      TempSrc:      SpO2:      Weight:      Height:        Intake/Output Summary (Last 24 hours) at 06/21/2023 0815 Last  data filed at 06/21/2023 0407 Gross per 24 hour  Intake 1200 ml  Output 2201 ml  Net -1001 ml   Filed Weights   06/19/23 1600 06/20/23 1423  Weight: 60.9 kg 62.3 kg    Examination: General exam: Appears calm and comfortable, NAD Respiratory system: No work of breathing, symmetric chest wall expansion.  Shallow respirations bilaterally with decreased aeration on the left. Cardiovascular system: S1 & S2 heard, RRR.  Gastrointestinal system: Abdomen is nondistended, soft and nontender.  Neuro: Alert and oriented. No focal neurological deficits. Extremities: Symmetric, expected ROM Skin: No rashes, lesions Psychiatry: Demonstrates appropriate judgement and insight. Mood & affect appropriate for situation.   Assessment & Plan:  Principal Problem:   Multifocal pneumonia Active Problems:   Pneumonia   Atypical chest pain   Pulmonary infiltrate  Sepsis secondary to multifocal pneumonia Left lower lobe liquefication necrosis -Multiple prior admissions for multifocal pneumonia.  Appeared pneumonia was improving due to resolution of leukocytosis and no fevers however now with possible liquefication necrosis in the left lower lung base. - Patient initially started on vancomycin and cefepime - Pulmonology consulted. -Patient was switched to Unasyn on 11/28.  He said no further fevers and WBC remained stable.  Will discontinue Unasyn today and initiate Augmentin 875 twice daily x 28 days. - Follow-up CT scan outpatient in 3 weeks to determine final antibiotic duration.  -  ID consulted as well. - Continue symptomatic support, Mucinex daily - Trend fever  curve and CBC - Follow-up blood cultures, negative to date - MRSA PCR negative  Chest wall pain - Likely secondary to costochondritis/pleuritic chest pain given the above findings.  Worse with cough and deep inspiration - Encourage incentive spirometer and flutter valve - Lidocaine patch and pain management as needed  Chronic combined  systolic and diastolic heart failure - TTE 05/27/2023: EF less than 20%, LV global hypokinesis, grade 2 diastolic dysfunction, moderate to severe LA dilation and moderate to severe TR. - Poor medication compliance - BNP elevation this admission 1415, which is slightly elevated compared to 5 days ago - Remains on room air - Discontinue IV Lasix, transition to p.o. - Follow strict I's and O's - Continue home meds  CKD stage IIIa Hyperkalemia - Baseline creatinine appears to be 1.2-1.4.  Appears close to baseline currently - Proceed with Lokelma and shifters if potassium remains elevated - Avoid nephrotoxic meds - Renally dose were needed - Trend CMP  Hypertension - Resume home dose Coreg  Emphysema - CT chest on admission reveals evidence of emphysema, mild respiratory wheezing on exam - Continue Spiriva daily inhaler - As needed DuoNebs  History of CVA History of CAD History of NSTEMI - Continue home dose Eliquis, Plavix, statin  Polysubstance abuse - Ongoing crack cocaine abuse.  Last crack smoked PTA - UDS positive on arrival. - Patient has been counseled extensively on dangers of continuing. - TOC consult to assist with substance use resources  Homelessness - TOC consulted, appreciate assistance   DVT prophylaxis: Eliquis Code Status: Full Family Communication: none at bedside. Communicated directly with pt Disposition:-Initiated on Augmentin.  If without issue overnight will plan to discharge home tomorrow with extended antibiotic course.  Needs outpatient follow-up. TOC assisting    Consultants:  Pulm ID  Procedures:  N/a  Antimicrobials:  Anti-infectives (From admission, onward)    Start     Dose/Rate Route Frequency Ordered Stop   06/20/23 2200  vancomycin (VANCOREADY) IVPB 1250 mg/250 mL  Status:  Discontinued        1,250 mg 166.7 mL/hr over 90 Minutes Intravenous Every 24 hours 06/20/23 1023 06/20/23 1616   06/20/23 1700  Ampicillin-Sulbactam  (UNASYN) 3 g in sodium chloride 0.9 % 100 mL IVPB        3 g 200 mL/hr over 30 Minutes Intravenous Every 6 hours 06/20/23 1611     06/20/23 1100  ceFEPIme (MAXIPIME) 2 g in sodium chloride 0.9 % 100 mL IVPB  Status:  Discontinued        2 g 200 mL/hr over 30 Minutes Intravenous Every 8 hours 06/20/23 1023 06/20/23 1611   06/20/23 0015  vancomycin (VANCOCIN) IVPB 1000 mg/200 mL premix        1,000 mg 200 mL/hr over 60 Minutes Intravenous  Once 06/20/23 0011 06/20/23 0236   06/20/23 0015  ceFEPIme (MAXIPIME) 2 g in sodium chloride 0.9 % 100 mL IVPB        2 g 200 mL/hr over 30 Minutes Intravenous  Once 06/20/23 0011 06/20/23 0147       Data Reviewed: I have personally reviewed following labs and imaging studies CBC: Recent Labs  Lab 06/16/23 2334 06/19/23 1700 06/20/23 0512  WBC 11.1* 10.5 8.2  NEUTROABS 7.6 8.0*  --   HGB 11.5* 12.8* 9.8*  HCT 36.7* 40.7 31.1*  MCV 92.4 94.2 91.7  PLT 647* 483* 499*   Basic Metabolic Panel: Recent Labs  Lab 06/16/23 2334 06/19/23 1700 06/20/23 0512  NA 137 141  138  K 4.4 5.4* 4.5  CL 103 106 104  CO2 26 23 25   GLUCOSE 102* 79 88  BUN 27* 25* 26*  CREATININE 1.35* 1.24 1.28*  CALCIUM 8.9 9.6 9.1   GFR: Estimated Creatinine Clearance: 60.8 mL/min (A) (by C-G formula based on SCr of 1.28 mg/dL (H)). Liver Function Tests: Recent Labs  Lab 06/16/23 2334 06/19/23 1700 06/20/23 0512  AST 34 35 23  ALT 15 13 11   ALKPHOS 152* 143* 116  BILITOT 0.5 0.8 0.9  PROT 8.4* 8.9* 7.1  ALBUMIN 3.0* 3.4* 2.7*   CBG: No results for input(s): "GLUCAP" in the last 168 hours.  Recent Results (from the past 240 hour(s))  Respiratory (~20 pathogens) panel by PCR     Status: None   Collection Time: 06/12/23  4:26 PM   Specimen: Nasopharyngeal Swab; Respiratory  Result Value Ref Range Status   Adenovirus NOT DETECTED NOT DETECTED Final   Coronavirus 229E NOT DETECTED NOT DETECTED Final    Comment: (NOTE) The Coronavirus on the Respiratory  Panel, DOES NOT test for the novel  Coronavirus (2019 nCoV)    Coronavirus HKU1 NOT DETECTED NOT DETECTED Final   Coronavirus NL63 NOT DETECTED NOT DETECTED Final   Coronavirus OC43 NOT DETECTED NOT DETECTED Final   Metapneumovirus NOT DETECTED NOT DETECTED Final   Rhinovirus / Enterovirus NOT DETECTED NOT DETECTED Final   Influenza A NOT DETECTED NOT DETECTED Final   Influenza B NOT DETECTED NOT DETECTED Final   Parainfluenza Virus 1 NOT DETECTED NOT DETECTED Final   Parainfluenza Virus 2 NOT DETECTED NOT DETECTED Final   Parainfluenza Virus 3 NOT DETECTED NOT DETECTED Final   Parainfluenza Virus 4 NOT DETECTED NOT DETECTED Final   Respiratory Syncytial Virus NOT DETECTED NOT DETECTED Final   Bordetella pertussis NOT DETECTED NOT DETECTED Final   Bordetella Parapertussis NOT DETECTED NOT DETECTED Final   Chlamydophila pneumoniae NOT DETECTED NOT DETECTED Final   Mycoplasma pneumoniae NOT DETECTED NOT DETECTED Final    Comment: Performed at [facility], [address], [place], [place] [address]  Resp panel by RT-PCR (RSV, Flu A&B, Covid) Anterior Nasal Swab     Status: None   Collection Time: 06/19/23  5:01 PM   Specimen: Anterior Nasal Swab  Result Value Ref Range Status   SARS Coronavirus 2 by RT PCR NEGATIVE NEGATIVE Final    Comment: (NOTE) SARS-CoV-2 target nucleic acids are NOT DETECTED.  The SARS-CoV-2 RNA is generally detectable in upper respiratory specimens during the acute phase of infection. The lowest concentration of SARS-CoV-2 viral copies this assay can detect is 138 copies/mL. A negative result does not preclude SARS-Cov-2 infection and should not be used as the sole basis for treatment or other patient management decisions. A negative result may occur with  improper specimen collection/handling, submission of specimen other than nasopharyngeal swab, presence of viral mutation(s) within the areas targeted by this assay, and inadequate number of  viral copies(<138 copies/mL). A negative result must be combined with clinical observations, patient history, and epidemiological information. The expected result is Negative.  Fact Sheet for Patients:  BloggerCourse.com  Fact Sheet for Healthcare Providers:  SeriousBroker.it  This test is no t yet approved or cleared by the [place] FDA and  has been authorized for detection and/or diagnosis of SARS-CoV-2 by FDA under an Emergency Use Authorization (EUA). This EUA will remain  in effect (meaning this test can be used) for the duration of the COVID-19 declaration under Section  564(b)(1) of the Act, 21 U.S.C.section 360bbb-3(b)(1), unless the authorization is terminated  or revoked sooner.       Influenza A by PCR NEGATIVE NEGATIVE Final   Influenza B by PCR NEGATIVE NEGATIVE Final    Comment: (NOTE) The Xpert Xpress SARS-CoV-2/FLU/RSV plus assay is intended as an aid in the diagnosis of influenza from Nasopharyngeal swab specimens and should not be used as a sole basis for treatment. Nasal washings and aspirates are unacceptable for Xpert Xpress SARS-CoV-2/FLU/RSV testing.  Fact Sheet for Patients: BloggerCourse.com  Fact Sheet for Healthcare Providers: SeriousBroker.it  This test is not yet approved or cleared by the [place] FDA and has been authorized for detection and/or diagnosis of SARS-CoV-2 by FDA under an Emergency Use Authorization (EUA). This EUA will remain in effect (meaning this test can be used) for the duration of the COVID-19 declaration under Section 564(b)(1) of the Act, 21 U.S.C. section 360bbb-3(b)(1), unless the authorization is terminated or revoked.     Resp Syncytial Virus by PCR NEGATIVE NEGATIVE Final    Comment: (NOTE) Fact Sheet for Patients: BloggerCourse.com  Fact Sheet for Healthcare  Providers: SeriousBroker.it  This test is not yet approved or cleared by the [place] FDA and has been authorized for detection and/or diagnosis of SARS-CoV-2 by FDA under an Emergency Use Authorization (EUA). This EUA will remain in effect (meaning this test can be used) for the duration of the COVID-19 declaration under Section 564(b)(1) of the Act, 21 U.S.C. section 360bbb-3(b)(1), unless the authorization is terminated or revoked.  Performed at [facility], [address], [place], [place] [address]   MRSA Next Gen by PCR, Nasal     Status: None   Collection Time: 06/20/23  2:16 PM   Specimen: Nasal Mucosa; Nasal Swab  Result Value Ref Range Status   MRSA by PCR Next Gen NOT DETECTED NOT DETECTED Final    Comment: (NOTE) The GeneXpert MRSA Assay (FDA approved for NASAL specimens only), is one component of a comprehensive MRSA colonization surveillance program. It is not intended to diagnose MRSA infection nor to guide or monitor treatment for MRSA infections. Test performance is not FDA approved in patients less than 56 years old. Performed at [facility], [address], [place], [place] [address]      Radiology Studies: CT Angio Chest PE W and/or Wo Contrast  Result Date: 06/19/2023 CLINICAL DATA:  Pulmonary embolism suspected. High probability, with shortness of breath. EXAM: CT ANGIOGRAPHY CHEST WITH CONTRAST TECHNIQUE: Multidetector CT imaging of the chest was performed using the standard protocol during bolus administration of intravenous contrast. Multiplanar CT image reconstructions and MIPs were obtained to evaluate the vascular anatomy. RADIATION DOSE REDUCTION: This exam was performed according to the departmental dose-optimization program which includes automated exposure control, adjustment of the mA and/or kV according to patient size and/or use of iterative reconstruction technique. CONTRAST:   75mL OMNIPAQUE IOHEXOL 350 MG/ML SOLN COMPARISON:  AP Lat chest today, CTA chest 06/17/2023, CTA chest 06/04/2023. FINDINGS: Cardiovascular: There is moderate panchamber cardiomegaly with minimal pericardial effusion again noted. Once again there is IVC and hepatic vein contrast reflux most likely indicating ongoing right heart dysfunction versus tricuspid regurgitation. The pulmonary trunk is slightly prominent, as before, without evidence of arterial embolism. The aorta and great vessels are unremarkable. There are no visible coronary calcifications. Central pulmonary veins are distended but no more than previously. Mediastinum/Nodes: Subcarinal nodal mass again measures 2.3 cm in short axis. Less enlarged lymph  nodes at the AP window up to 1.2 cm in short axis. There scattered slightly prominent hilar lymph nodes bilaterally. The thyroid gland, thoracic trachea, thoracic esophagus are unremarkable. No new or progressive adenopathy.  Axillary spaces are clear. Lungs/Pleura: Persistent diffuse bronchial thickening. Similar appearance interlobular septal thickening in the lung bases consistent with mild interstitial edema. Small right and minimal left layering pleural effusions are similar. Consolidation continues to be noted in the anterior and posterior basal segments of the right lower lobe and lateral basal segment of the left lower lobe. The left lower lobe opacity appears more hypodense which suggest liquefaction necrosis. There is improved atelectasis in the lingular base. Persisting mosaicism of the lung fields which is either due to small airways disease with air trapping or ground-glass edema. There are paraseptal emphysematous changes in the upper lobes. Scattered linear scarring or atelectasis in the bases. There is no new infiltrate. Upper Abdomen: No acute abnormality.  Hepatic steatosis. Musculoskeletal: There is thoracic kyphodextroscoliosis and mild degenerative changes. No acute or significant  osseous findings are otherwise seen. Review of the MIP images confirms the above findings. IMPRESSION: 1. No evidence of arterial embolism. Slight prominence of the pulmonary trunk. 2. Cardiomegaly with minimal pericardial effusion, IVC and hepatic vein contrast reflux the latter most likely indicating ongoing right heart dysfunction versus tricuspid regurgitation. 3. Persisting interlobular septal thickening in the lung bases consistent with mild interstitial edema. 4. Small right and minimal left layering pleural effusions are similar. 5. Consolidation in the anterior and posterior basal segments of the right lower lobe and lateral basal segment of the left lower lobe. The left lower lobe opacity appears more hypodense which suggests liquefaction necrosis. 6. Persisting mosaicism of the lung fields which is either due to small airways disease with air trapping or ground-glass edema. 7. Emphysema. Redemonstrated bronchial thickening which could be bronchitis or congestive. 8. Stable mediastinal and hilar adenopathy. 9. Hepatic steatosis. Emphysema (ICD10-J43.9). Electronically Signed   By: Almira Bar M.D.   On: 06/19/2023 21:13   DG Chest 2 View  Result Date: 06/19/2023 CLINICAL DATA:  Chest pain and weakness. EXAM: CHEST - 2 VIEW COMPARISON:  06/16/2023 FINDINGS: Moderate cardiomegaly again demonstrated. Tiny bilateral pleural effusions are noted. Increased airspace opacity seen in the right lower lobe, suspicious for pneumonia. IMPRESSION: Increased right lower lobe airspace opacity, suspicious for pneumonia. Tiny bilateral pleural effusions. Stable cardiomegaly. Electronically Signed   By: Danae Orleans M.D.   On: 06/19/2023 16:53    Scheduled Meds:  apixaban  5 mg Oral BID   atorvastatin  40 mg Oral Daily   carvedilol  3.125 mg Oral BID   clopidogrel  75 mg Oral Daily   furosemide  40 mg Intravenous Daily   guaiFENesin  600 mg Oral BID   lidocaine  1 patch Transdermal Q24H   umeclidinium  bromide  1 puff Inhalation Daily   Continuous Infusions:  ampicillin-sulbactam (UNASYN) IV 3 g (06/21/23 0407)     LOS: 1 day    Time spent:   Debarah Crape, DO [facility]  To contact the attending physician between 7A-7P please use Epic Chat. To contact the covering physician during after hours 7P-7A, please review Amion.   06/21/2023, 8:15 AM

## 2023-06-21 NOTE — Discharge Instructions (Signed)
 FOOD PANTRY [employer] [facility] [address] [phone]  [facility] [employer] [address] [place] [phone]  [facility] [employer] [address] [phone]  [facility] [address] [phone]  [facility] [address] [place], [place] [address] [phone]

## 2023-06-22 ENCOUNTER — Other Ambulatory Visit (HOSPITAL_COMMUNITY): Payer: Self-pay

## 2023-06-22 LAB — COMPREHENSIVE METABOLIC PANEL
ALT: 11 U/L (ref 0–44)
AST: 25 U/L (ref 15–41)
Albumin: 2.8 g/dL — ABNORMAL LOW (ref 3.5–5.0)
Alkaline Phosphatase: 113 U/L (ref 38–126)
Anion gap: 11 (ref 5–15)
BUN: 29 mg/dL — ABNORMAL HIGH (ref 6–20)
CO2: 25 mmol/L (ref 22–32)
Calcium: 8.9 mg/dL (ref 8.9–10.3)
Chloride: 100 mmol/L (ref 98–111)
Creatinine, Ser: 1.49 mg/dL — ABNORMAL HIGH (ref 0.61–1.24)
GFR, Estimated: 57 mL/min — ABNORMAL LOW (ref >60)
Glucose, Bld: 109 mg/dL — ABNORMAL HIGH (ref 70–99)
Potassium: 4.6 mmol/L (ref 3.5–5.1)
Sodium: 136 mmol/L (ref 135–145)
Total Bilirubin: 0.5 mg/dL (ref <1.2)
Total Protein: 7.3 g/dL (ref 6.5–8.1)

## 2023-06-22 LAB — CBC WITH DIFFERENTIAL/PLATELET
Abs Immature Granulocytes: 0.04 10*3/uL (ref 0.00–0.07)
Basophils Absolute: 0 10*3/uL (ref 0.0–0.1)
Basophils Relative: 0 %
Eosinophils Absolute: 0.8 10*3/uL — ABNORMAL HIGH (ref 0.0–0.5)
Eosinophils Relative: 9 %
HCT: 35 % — ABNORMAL LOW (ref 39.0–52.0)
Hemoglobin: 11 g/dL — ABNORMAL LOW (ref 13.0–17.0)
Immature Granulocytes: 1 %
Lymphocytes Relative: 16 %
Lymphs Abs: 1.4 10*3/uL (ref 0.7–4.0)
MCH: 28.8 pg (ref 26.0–34.0)
MCHC: 31.4 g/dL (ref 30.0–36.0)
MCV: 91.6 fL (ref 80.0–100.0)
Monocytes Absolute: 0.4 10*3/uL (ref 0.1–1.0)
Monocytes Relative: 5 %
Neutro Abs: 5.8 10*3/uL (ref 1.7–7.7)
Neutrophils Relative %: 69 %
Platelets: 378 10*3/uL (ref 150–400)
RBC: 3.82 MIL/uL — ABNORMAL LOW (ref 4.22–5.81)
RDW: 15 % (ref 11.5–15.5)
WBC: 8.4 10*3/uL (ref 4.0–10.5)
nRBC: 0 % (ref 0.0–0.2)

## 2023-06-22 LAB — PHOSPHORUS: Phosphorus: 4.5 mg/dL (ref 2.5–4.6)

## 2023-06-22 LAB — MAGNESIUM: Magnesium: 2.2 mg/dL (ref 1.7–2.4)

## 2023-06-22 MED ORDER — UMECLIDINIUM BROMIDE 62.5 MCG/ACT IN AEPB
1.0000 | INHALATION_SPRAY | Freq: Every day | RESPIRATORY_TRACT | 0 refills | Status: AC
  Filled 2023-06-22 (×2): qty 30, 30d supply, fill #0

## 2023-06-22 MED ORDER — AMOXICILLIN-POT CLAVULANATE 875-125 MG PO TABS
1.0000 | ORAL_TABLET | Freq: Two times a day (BID) | ORAL | 0 refills | Status: DC
  Filled 2023-06-22: qty 56, 28d supply, fill #0

## 2023-06-22 MED ORDER — APIXABAN 5 MG PO TABS
5.0000 mg | ORAL_TABLET | Freq: Two times a day (BID) | ORAL | 6 refills | Status: DC
  Filled 2023-06-22: qty 60, 30d supply, fill #0

## 2023-06-22 MED ORDER — FUROSEMIDE 40 MG PO TABS
40.0000 mg | ORAL_TABLET | Freq: Every day | ORAL | 0 refills | Status: DC
  Filled 2023-06-22 (×2): qty 30, 30d supply, fill #0

## 2023-06-22 MED ORDER — UMECLIDINIUM BROMIDE 62.5 MCG/ACT IN AEPB
1.0000 | INHALATION_SPRAY | Freq: Every day | RESPIRATORY_TRACT | 0 refills | Status: DC
  Filled 2023-06-22: qty 30, 30d supply, fill #0

## 2023-06-22 MED ORDER — FUROSEMIDE 40 MG PO TABS
40.0000 mg | ORAL_TABLET | Freq: Every day | ORAL | 0 refills | Status: DC
  Filled 2023-06-22: qty 30, 30d supply, fill #0

## 2023-06-22 MED ORDER — AMOXICILLIN-POT CLAVULANATE 875-125 MG PO TABS
1.0000 | ORAL_TABLET | Freq: Two times a day (BID) | ORAL | 0 refills | Status: AC
  Filled 2023-06-22 (×2): qty 56, 28d supply, fill #0

## 2023-06-22 NOTE — Discharge Summary (Signed)
Physician Discharge Summary   Patient: Craig Schmidt MRN: [id number] DOB: 1971/02/05  Admit date:     06/19/2023  Discharge date: 06/22/23  Discharge Physician: Debarah Crape   PCP: Rema Fendt, NP   Recommendations at discharge:   Follow-up with pulmonology in 3 weeks for repeat CT scan and to discuss antibiotics.   Discharge Diagnoses: Principal Problem:   Multifocal pneumonia Active Problems:   Pneumonia   Atypical chest pain   Pulmonary infiltrate  Resolved Problems:   * No resolved hospital problems. *  Hospital Course: Craig Schmidt is 52 y.o. male with a complicated past medical history complicated by homelessness for 30 years.  Patient has history of crack cocaine abuse, alcohol abuse, medication nonadherence, CAD, hypertension, hyperlipidemia, cardiomyopathy, prior NSTEMI, severe heart failure with reduced EF (less than 20%), prior CVA, hyperlipidemia, CKD stage IIIA who presented to the ED on this visit complaining of chest pain.  On arrival to the ED he was tachycardic and tachypneic but normotensive.  BNP elevated at 1415, UDS positive for cocaine endorses smoking crack the day prior to arrival.  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   Patient has had multiple ED visits over the last 3 weeks.  Recent visits as follows: 11/2 admitted for multifocal pneumonia and left AMA on 11/8 11/12 admitted for sepsis secondary to multifocal pneumonia and acute hypoxic respiratory failure.  Discharged 11/18. 11/19 admitted for persistent multifocal pneumonia and discharged on 11/21. 11/24 presented to ED for right flank pain and discharged home from ED.  On 11/27 patient was admitted for treatment of left lower lobe liquefication necrosis.  Pulmonology was consulted.  It did appear  that patient's pneumonia was improving due to resolution of leukocytosis and no fevers during this admission.  Patient was initially started on vancomycin and cefepime.  Pulmonology recommend switching to Unasyn on 11/28.  Patient tolerated Unasyn well without any further elevation of WBC and no fevers.  Patient was then switched to Augmentin which he will need to continue for minimum 28 days.  He needs follow-up with pulmonology in 3 weeks for repeat CT scan and at that time they will determine final antibiotic duration.  He may require up to 6 weeks total of antibiotic therapy.  This was all discussed extensively with the patient at bedside as well as with his girlfriend whom he reports is a helpful caretaker.  Medications were sent to the transition of care pharmacy prior to discharge and we discussed the importance of adherence.  TOC met with the patient and helped to arrange transportation on day of discharge.     Consultants: Pulmonology Procedures performed: none  Disposition: Home Diet recommendation:  Discharge Diet Orders (From admission, onward)     Start     Ordered   06/22/23 0000  Diet - low sodium heart healthy        06/22/23 1311           Regular diet DISCHARGE MEDICATION: Allergies as of 06/22/2023       Reactions   Shellfish Allergy Anaphylaxis   Peanut (diagnostic) Itching        Medication List     STOP taking these medications    Eliquis 5 MG Tabs tablet Generic drug: apixaban       TAKE these  medications    amoxicillin-clavulanate 875-125 MG tablet Commonly known as: AUGMENTIN Take 1 tablet by mouth every 12 (twelve) hours for 28 days.   atorvastatin 40 MG tablet Commonly known as: LIPITOR Take 1 tablet (40 mg total) by mouth daily.   carvedilol 3.125 MG tablet Commonly known as: COREG Take 1 tablet (3.125 mg total) by mouth 2 (two) times daily.   clopidogrel 75 MG tablet Commonly known as: PLAVIX Take 1 tablet (75 mg total) by mouth  daily.   furosemide 40 MG tablet Commonly known as: LASIX Take 1 tablet (40 mg total) by mouth daily. What changed: how much to take   Incruse Ellipta 62.5 MCG/ACT Aepb Generic drug: umeclidinium bromide Inhale 1 puff into the lungs daily. Start taking on: June 23, 2023        Discharge Exam: Craig Schmidt Weights   06/19/23 1600 06/20/23 1423  Weight: 60.9 kg 62.3 kg   General exam: Appears calm and comfortable, NAD Respiratory system: No work of breathing, symmetric chest wall expansion. decreased aeration on the left. Cough with deep inspiration Cardiovascular system: S1 & S2 heard, RRR.  Gastrointestinal system: Abdomen is nondistended, soft and nontender.  Neuro: Alert and oriented. No focal neurological deficits. Extremities: Symmetric, expected ROM Skin: No rashes, lesions Psychiatry: Demonstrates appropriate judgement and insight. Mood & affect appropriate for situation.   Condition at discharge: stable  The results of significant diagnostics from this hospitalization (including imaging, microbiology, ancillary and laboratory) are listed below for reference.   Imaging Studies: CT Angio Chest PE W and/or Wo Contrast  Result Date: 06/19/2023 CLINICAL DATA:  Pulmonary embolism suspected. High probability, with shortness of breath. EXAM: CT ANGIOGRAPHY CHEST WITH CONTRAST TECHNIQUE: Multidetector CT imaging of the chest was performed using the standard protocol during bolus administration of intravenous contrast. Multiplanar CT image reconstructions and MIPs were obtained to evaluate the vascular anatomy. RADIATION DOSE REDUCTION: This exam was performed according to the departmental dose-optimization program which includes automated exposure control, adjustment of the mA and/or kV according to patient size and/or use of iterative reconstruction technique. CONTRAST:  75mL OMNIPAQUE IOHEXOL 350 MG/ML SOLN COMPARISON:  AP Lat chest today, CTA chest 06/17/2023, CTA chest 06/04/2023.  FINDINGS: Cardiovascular: There is moderate panchamber cardiomegaly with minimal pericardial effusion again noted. Once again there is IVC and hepatic vein contrast reflux most likely indicating ongoing right heart dysfunction versus tricuspid regurgitation. The pulmonary trunk is slightly prominent, as before, without evidence of arterial embolism. The aorta and great vessels are unremarkable. There are no visible coronary calcifications. Central pulmonary veins are distended but no more than previously. Mediastinum/Nodes: Subcarinal nodal mass again measures 2.3 cm in short axis. Less enlarged lymph nodes at the AP window up to 1.2 cm in short axis. There scattered slightly prominent hilar lymph nodes bilaterally. The thyroid gland, thoracic trachea, thoracic esophagus are unremarkable. No new or progressive adenopathy.  Axillary spaces are clear. Lungs/Pleura: Persistent diffuse bronchial thickening. Similar appearance interlobular septal thickening in the lung bases consistent with mild interstitial edema. Small right and minimal left layering pleural effusions are similar. Consolidation continues to be noted in the anterior and posterior basal segments of the right lower lobe and lateral basal segment of the left lower lobe. The left lower lobe opacity appears more hypodense which suggest liquefaction necrosis. There is improved atelectasis in the lingular base. Persisting mosaicism of the lung fields which is either due to small airways disease with air trapping or ground-glass edema. There are paraseptal emphysematous changes in  the upper lobes. Scattered linear scarring or atelectasis in the bases. There is no new infiltrate. Upper Abdomen: No acute abnormality.  Hepatic steatosis. Musculoskeletal: There is thoracic kyphodextroscoliosis and mild degenerative changes. No acute or significant osseous findings are otherwise seen. Review of the MIP images confirms the above findings. IMPRESSION: 1. No evidence  of arterial embolism. Slight prominence of the pulmonary trunk. 2. Cardiomegaly with minimal pericardial effusion, IVC and hepatic vein contrast reflux the latter most likely indicating ongoing right heart dysfunction versus tricuspid regurgitation. 3. Persisting interlobular septal thickening in the lung bases consistent with mild interstitial edema. 4. Small right and minimal left layering pleural effusions are similar. 5. Consolidation in the anterior and posterior basal segments of the right lower lobe and lateral basal segment of the left lower lobe. The left lower lobe opacity appears more hypodense which suggests liquefaction necrosis. 6. Persisting mosaicism of the lung fields which is either due to small airways disease with air trapping or ground-glass edema. 7. Emphysema. Redemonstrated bronchial thickening which could be bronchitis or congestive. 8. Stable mediastinal and hilar adenopathy. 9. Hepatic steatosis. Emphysema (ICD10-J43.9). Electronically Signed   By: Almira Bar M.D.   On: 06/19/2023 21:13   DG Chest 2 View  Result Date: 06/19/2023 CLINICAL DATA:  Chest pain and weakness. EXAM: CHEST - 2 VIEW COMPARISON:  06/16/2023 FINDINGS: Moderate cardiomegaly again demonstrated. Tiny bilateral pleural effusions are noted. Increased airspace opacity seen in the right lower lobe, suspicious for pneumonia. IMPRESSION: Increased right lower lobe airspace opacity, suspicious for pneumonia. Tiny bilateral pleural effusions. Stable cardiomegaly. Electronically Signed   By: Danae Orleans M.D.   On: 06/19/2023 16:53   CT Angio Chest/Abd/Pel for Dissection W and/or W/WO  Result Date: 06/17/2023 CLINICAL DATA:  Acute aortic syndrome suspected. Low back pain and cough. Pain with breathing. EXAM: CT ANGIOGRAPHY CHEST, ABDOMEN AND PELVIS TECHNIQUE: Non-contrast CT of the chest was initially obtained. Multidetector CT imaging through the chest, abdomen and pelvis was performed using the standard protocol  during bolus administration of intravenous contrast. Multiplanar reconstructed images and MIPs were obtained and reviewed to evaluate the vascular anatomy. RADIATION DOSE REDUCTION: This exam was performed according to the departmental dose-optimization program which includes automated exposure control, adjustment of the mA and/or kV according to patient size and/or use of iterative reconstruction technique. CONTRAST:  OMNIPAQUE IOHEXOL 350 MG/ML SOLN COMPARISON:  06/12/2023. FINDINGS: CTA CHEST FINDINGS Cardiovascular: The heart is enlarged and there is a trace pericardial effusion. The aorta and pulmonary trunk are normal in caliber. No evidence of dissection is seen. The great vessels appear patent. Mediastinum/Nodes: Enlarged lymph nodes are present in the mediastinum measuring 1 cm at the AP window and 2.3 cm in the subcarinal space, likely reactive. No hilar or axillary lymphadenopathy is seen. The thyroid gland, trachea, and esophagus are within normal limits. Lungs/Pleura: Last hazy ground-glass attenuation is noted in the lungs bilaterally, slightly improved from the prior exam. There is bronchial wall thickening bilaterally with strandy opacities at the lung bases. Consolidation is noted in the anterior aspect and posterior portion of the right lower lobe and lateral aspect of the left lower lobe, improved from the prior exam. There is a small right pleural effusion and a trace left pleural effusion. No pneumothorax is seen. Musculoskeletal: Degenerative changes are present in the thoracic spine. No acute osseous abnormality. Review of the MIP images confirms the above findings. CTA ABDOMEN AND PELVIS FINDINGS VASCULAR Aorta: Normal caliber aorta without aneurysm, dissection, vasculitis or  significant stenosis. Aortic atherosclerosis. Celiac: Patent without evidence of aneurysm, dissection, vasculitis or significant stenosis. SMA: Patent without evidence of aneurysm, dissection, vasculitis or  significant stenosis. Renals: Both renal arteries are patent without evidence of aneurysm, dissection, vasculitis, fibromuscular dysplasia or significant stenosis. IMA: Patent. Inflow: Patent without evidence of aneurysm, dissection, vasculitis or significant stenosis. Veins: No obvious venous abnormality within the limitations of this arterial phase study. Review of the MIP images confirms the above findings. NON-VASCULAR Hepatobiliary: No focal liver abnormality is seen. No gallstones, gallbladder wall thickening, or biliary dilatation. Pancreas: Unremarkable. No pancreatic ductal dilatation or surrounding inflammatory changes. Spleen: Normal in size without focal abnormality. Adrenals/Urinary Tract: The adrenal glands are within normal limits. The kidneys enhance symmetrically. No renal calculus or hydronephrosis. The bladder is unremarkable. Stomach/Bowel: Stomach is within normal limits. Appendix is not seen. No evidence of bowel wall thickening, distention, or inflammatory changes. No free air or pneumatosis. A moderate amount of retained stool is present in the colon. Lymphatic: No abdominal or pelvic lymphadenopathy. Reproductive: Prostate is unremarkable. Other: No abdominopelvic ascites. Musculoskeletal: A pars defect is noted at L5 on the left. Mild degenerative changes are noted in the lumbar spine. No acute osseous abnormality. Review of the MIP images confirms the above findings. IMPRESSION: 1. Aortic atherosclerosis without evidence of aneurysm or dissection. 2. Ground-glass attenuation in the lungs, bronchial wall thickening, strandy airspace opacities, and bilateral lower lobe consolidation, improved from the previous exam. 3. Small bilateral pleural effusions. 4. Moderate amount of retained stool in the colon suggesting constipation. 5. Cardiomegaly. Electronically Signed   By: Thornell Sartorius M.D.   On: 06/17/2023 02:30   DG Chest Port 1 View  Result Date: 06/16/2023 CLINICAL DATA:  Chest pain  short of breath EXAM: PORTABLE CHEST 1 VIEW COMPARISON:  06/12/2023, CT 06/04/2023 FINDINGS: Cardiomegaly with small right pleural effusion. Mild right peripheral lung base airspace opacity. No pneumothorax. IMPRESSION: Cardiomegaly with small right pleural effusion. Mild right peripheral lung base airspace opacity/pneumonia without significant change. Electronically Signed   By: Jasmine Pang M.D.   On: 06/16/2023 23:56   DG Chest 2 View  Result Date: 06/12/2023 CLINICAL DATA:  Follow-up multifocal pneumonia EXAM: CHEST - 2 VIEW COMPARISON:  06/10/2023 FINDINGS: Cardiac shadow is enlarged. Patchy airspace opacity is noted in the lateral right lung base. Small effusions are seen bilaterally. No other focal abnormality is noted. IMPRESSION: Small effusions and mild right basilar infiltrate. Electronically Signed   By: Alcide Clever M.D.   On: 06/12/2023 20:11   DG Chest 1 View  Result Date: 06/11/2023 CLINICAL DATA:  Shortness of breath, recent diagnosis of pneumonia. EXAM: CHEST  1 VIEW COMPARISON:  06/10/2023, 06/12/2023. FINDINGS: Single lateral view of the chest was obtained. Patchy airspace disease is present in the mid to lower lung fields. There suspected consolidation in the right upper lobe. No acute osseous abnormality is seen. IMPRESSION: Patchy airspace disease in the mid to lower lung fields, compatible with known multifocal pneumonia. There is suspected consolidation in the right upper lobe. Repeat AP view of the chest is recommended. Electronically Signed   By: Thornell Sartorius M.D.   On: 06/11/2023 01:38   DG Chest Portable 1 View  Result Date: 06/11/2023 CLINICAL DATA:  Recent diagnosis with pneumonia, reports he cannot take a deep breath without pain. EXAM: PORTABLE CHEST 1 VIEW COMPARISON:  June 04, 2023 FINDINGS: The cardiac silhouette is mildly enlarged and unchanged in size. Low lung volumes are seen with mild to moderate severity  bibasilar infiltrates, left greater than right.  This is mildly decreased in severity when compared to the prior study. No pleural effusion or pneumothorax is identified. The visualized skeletal structures are unremarkable. IMPRESSION: Low lung volumes with mild to moderate severity bibasilar infiltrates, left greater than right. Electronically Signed   By: Aram Candela M.D.   On: 06/11/2023 00:12   CT Angio Chest PE W and/or Wo Contrast  Result Date: 06/04/2023 CLINICAL DATA:  52 year old male with history of chest pain. Suspected pulmonary embolism. EXAM: CT ANGIOGRAPHY CHEST WITH CONTRAST TECHNIQUE: Multidetector CT imaging of the chest was performed using the standard protocol during bolus administration of intravenous contrast. Multiplanar CT image reconstructions and MIPs were obtained to evaluate the vascular anatomy. RADIATION DOSE REDUCTION: This exam was performed according to the departmental dose-optimization program which includes automated exposure control, adjustment of the mA and/or kV according to patient size and/or use of iterative reconstruction technique. CONTRAST:  75mL OMNIPAQUE IOHEXOL 350 MG/ML SOLN COMPARISON:  Chest CT 05/30/2023. FINDINGS: Cardiovascular: There are no filling defects in the pulmonary arterial tree to suggest pulmonary embolism. Heart size is enlarged with left ventricular dilatation. There is no significant pericardial fluid, thickening or pericardial calcification. No atherosclerotic calcifications are noted in the thoracic aorta or the coronary arteries. Mediastinum/Nodes: Multiple prominent borderline enlarged mediastinal and hilar lymph nodes, nonspecific, but likely reactive. Esophagus is unremarkable in appearance. No axillary lymphadenopathy. Lungs/Pleura: Worsening patchy areas of airspace consolidation are noted throughout the lungs bilaterally, most evident in the mid to lower lungs, particularly in the lower lobes of the lungs where there is extensive ground-glass attenuation, peribronchovascular  consolidation in more dense areas of mass-like consolidation, indicative of progressive multilobar bilateral pneumonia. Trace bilateral pleural effusions are also noted. Upper Abdomen: Unremarkable. Musculoskeletal: There are no aggressive appearing lytic or blastic lesions noted in the visualized portions of the skeleton. Review of the MIP images confirms the above findings. IMPRESSION: 1. No evidence of pulmonary embolism. 2. Progressive multilobar bilateral pneumonia, most severe in the lower lobes of the lungs bilaterally. 3. Trace bilateral parapneumonic pleural effusions. 4. Cardiomegaly with left ventricular dilatation. Electronically Signed   By: Trudie Reed M.D.   On: 06/04/2023 05:46   DG Chest Portable 1 View  Result Date: 06/04/2023 CLINICAL DATA:  52 year old male with signs and symptoms of pneumonia. EXAM: PORTABLE CHEST 1 VIEW COMPARISON:  Chest x-ray 05/30/2023. FINDINGS: Lung volumes are slightly low. Extensive opacities throughout the mid to lower lungs bilaterally indicative of areas of atelectasis and/or consolidation, with superimposed small bilateral pleural effusions. No pneumothorax. No evidence of pulmonary edema. Heart size is mildly enlarged. The patient is rotated to the left on today's exam, resulting in distortion of the mediastinal contours and reduced diagnostic sensitivity and specificity for mediastinal pathology. IMPRESSION: 1. Low lung volumes with patchy areas of atelectasis and/or consolidation throughout the mid to lower lungs bilaterally and superimposed small bilateral pleural effusions. Electronically Signed   By: Trudie Reed M.D.   On: 06/04/2023 05:42   CT CHEST WO CONTRAST  Result Date: 05/30/2023 CLINICAL DATA:  Pneumonia, complication suspected, xray done Respiratory illness, nondiagnostic xray EXAM: CT CHEST WITHOUT CONTRAST TECHNIQUE: Multidetector CT imaging of the chest was performed following the standard protocol without IV contrast. RADIATION  DOSE REDUCTION: This exam was performed according to the departmental dose-optimization program which includes automated exposure control, adjustment of the mA and/or kV according to patient size and/or use of iterative reconstruction technique. COMPARISON:  Radiograph earlier today. FINDINGS: Cardiovascular:  Prominent cardiomegaly. No pericardial effusion. The thoracic aorta is normal in caliber. Mediastinum/Nodes: Shotty mediastinal lymph nodes, including 10 mm short axis anterior paratracheal node series 3, image 21. Prevascular node measures 11 mm series 3, image 23. Hilar assessment is limited in the absence of IV contrast. No esophageal wall thickening. Lungs/Pleura: Multifocal bilateral lower lobe consolidations, peripheral with ground-glass and consolidative components. Small air bronchograms in the anterior right lower lobe consolidation. Bandlike areas of subsegmental atelectasis or scarring within the anterior right middle lobe. Heterogeneous pulmonary parenchyma with mild apical emphysema. There are small bilateral pleural effusions, partially loculated on the left. Upper Abdomen: Hepatic steatosis. No acute findings in the included upper abdomen. Musculoskeletal: There is generalized body wall edema. Scoliosis and degenerative change in the spine. There are no acute or suspicious osseous abnormalities. IMPRESSION: 1. Multifocal bilateral lower lobe consolidations, peripheral with ground-glass and consolidative components. Findings are suspicious for multifocal pneumonia. Appearance can be seen in the setting of septic emboli. Recommend radiologic follow-up to resolution. 2. Small bilateral pleural effusions, partially loculated on the left. 3. Prominent cardiomegaly. 4. Shotty mediastinal lymph nodes, likely reactive. 5. Hepatic steatosis. 6. Generalized body wall edema. Emphysema (ICD10-J43.9). Electronically Signed   By: Narda Rutherford M.D.   On: 05/30/2023 22:36   DG Chest 2 View  Result Date:  05/30/2023 CLINICAL DATA:  Pneumonia EXAM: CHEST - 2 VIEW COMPARISON:  05/25/2023 FINDINGS: Frontal and lateral views of the chest demonstrates stable enlargement of the cardiac silhouette. Persistent consolidation at the lung bases. Stable diffuse interstitial prominence. No effusion or pneumothorax. No acute bony abnormality. IMPRESSION: 1. Continued bibasilar airspace disease, consistent with given history of pneumonia. Given the persistence of the left lower lobe consolidation since 12/02/2022, follow-up CT may be useful as previously recommended. Electronically Signed   By: Sharlet Salina M.D.   On: 05/30/2023 14:35   VAS [place] LOWER EXTREMITY VENOUS (DVT)  Result Date: 05/27/2023  Lower Venous DVT Study Patient Name:  Craig Schmidt  Date of Exam:   05/27/2023 Medical Rec #: [id number]           Accession #:    [id number] Date of Birth: Nov 07, 1970            Patient Gender: M Patient Age:   52 years Exam Location:  [facility] Procedure:      VAS [place] LOWER EXTREMITY VENOUS (DVT) Referring Phys: [facility] GOEL --------------------------------------------------------------------------------  Indications: Swelling, and Edema.  Limitations: Patient involuntary movement. Comparison Study: Previous exam on 2.25.2023 Performing Technologist: Fernande Bras  Examination Guidelines: A complete evaluation includes B-mode imaging, spectral Doppler, color Doppler, and power Doppler as needed of all accessible portions of each vessel. Bilateral testing is considered an integral part of a complete examination. Limited examinations for reoccurring indications may be performed as noted. The reflux portion of the exam is performed with the patient in reverse Trendelenburg.  +---------+---------------+---------+-----------+----------+--------------+ RIGHT    CompressibilityPhasicitySpontaneityPropertiesThrombus Aging +---------+---------------+---------+-----------+----------+--------------+ CFV      Full            Yes      Yes                                 +---------+---------------+---------+-----------+----------+--------------+ SFJ      Full                                                        +---------+---------------+---------+-----------+----------+--------------+  FV Prox  Full                                                        +---------+---------------+---------+-----------+----------+--------------+ FV Mid   Full                                                        +---------+---------------+---------+-----------+----------+--------------+ FV DistalFull                                                        +---------+---------------+---------+-----------+----------+--------------+ PFV      Full                                                        +---------+---------------+---------+-----------+----------+--------------+ POP      Full           Yes      Yes                                 +---------+---------------+---------+-----------+----------+--------------+ PTV      Full                                                        +---------+---------------+---------+-----------+----------+--------------+ PERO     Full                                                        +---------+---------------+---------+-----------+----------+--------------+   +---------+---------------+---------+-----------+----------+--------------+ LEFT     CompressibilityPhasicitySpontaneityPropertiesThrombus Aging +---------+---------------+---------+-----------+----------+--------------+ CFV      Full           Yes      Yes                                 +---------+---------------+---------+-----------+----------+--------------+ SFJ      Full                                                        +---------+---------------+---------+-----------+----------+--------------+ FV Prox  Full                                                         +---------+---------------+---------+-----------+----------+--------------+  FV Mid   Full                                                        +---------+---------------+---------+-----------+----------+--------------+ FV DistalFull                                                        +---------+---------------+---------+-----------+----------+--------------+ PFV      Full                                                        +---------+---------------+---------+-----------+----------+--------------+ POP      Full           Yes      Yes                                 +---------+---------------+---------+-----------+----------+--------------+ PTV      Full                                                        +---------+---------------+---------+-----------+----------+--------------+ PERO     Full                                                        +---------+---------------+---------+-----------+----------+--------------+     Summary: BILATERAL: - No evidence of deep vein thrombosis seen in the lower extremities, bilaterally. -No evidence of popliteal cyst, bilaterally.   *See table(s) above for measurements and observations. Electronically signed by Sherald Hess MD on 05/27/2023 at 3:05:22 PM.    Final    VAS US CAROTID  Result Date: 05/27/2023 Carotid Arterial Duplex Study Patient Name:  Craig Schmidt  Date of Exam:   05/27/2023 Medical Rec #: [id number]           Accession #:    [id number] Date of Birth: 06-Jan-1971            Patient Gender: M Patient Age:   56 years Exam Location:  [facility] Procedure:      VAS US CAROTID Referring Phys: Lorin Glass --------------------------------------------------------------------------------  Indications:  CVA. Risk Factors: Prior MI. Limitations   Today's exam was limited due to Patient involuntary movement. Performing Technologist: Fernande Bras  Examination Guidelines: A complete  evaluation includes B-mode imaging, spectral Doppler, color Doppler, and power Doppler as needed of all accessible portions of each vessel. Bilateral testing is considered an integral part of a complete examination. Limited examinations for reoccurring indications may be performed as noted.  Right Carotid Findings: +----------+--------+--------+--------+------------------+------------------+           PSV cm/sEDV cm/sStenosisPlaque DescriptionComments           +----------+--------+--------+--------+------------------+------------------+  CCA Prox  75      17                                                   +----------+--------+--------+--------+------------------+------------------+ CCA Distal67      30                                                   +----------+--------+--------+--------+------------------+------------------+ ICA Prox  61      21                                intimal thickening +----------+--------+--------+--------+------------------+------------------+ ICA Mid   56      28                                                   +----------+--------+--------+--------+------------------+------------------+ ICA Distal62      21                                                   +----------+--------+--------+--------+------------------+------------------+ ECA       79      19                                                   +----------+--------+--------+--------+------------------+------------------+ +----------+--------+-------+--------+-------------------+           PSV cm/sEDV cmsDescribeArm Pressure (mmHG) +----------+--------+-------+--------+-------------------+ Subclavian89      17                                 +----------+--------+-------+--------+-------------------+ +---------+--------+--+--------+--+ VertebralPSV cm/s50EDV cm/s19 +---------+--------+--+--------+--+  Left Carotid Findings:  +----------+--------+--------+--------+------------------+------------------+           PSV cm/sEDV cm/sStenosisPlaque DescriptionComments           +----------+--------+--------+--------+------------------+------------------+ CCA Prox  90      31                                                   +----------+--------+--------+--------+------------------+------------------+ CCA Distal67      22                                                   +----------+--------+--------+--------+------------------+------------------+ ICA Prox  47      21  intimal thickening +----------+--------+--------+--------+------------------+------------------+ ICA Mid   64      26                                                   +----------+--------+--------+--------+------------------+------------------+ ICA Distal69      34                                                   +----------+--------+--------+--------+------------------+------------------+ ECA       65      17                                                   +----------+--------+--------+--------+------------------+------------------+ +----------+--------+--------+--------+-------------------+           PSV cm/sEDV cm/sDescribeArm Pressure (mmHG) +----------+--------+--------+--------+-------------------+ [id number]      23                                  +----------+--------+--------+--------+-------------------+ +---------+--------+--+--------+--+ VertebralPSV cm/s59EDV cm/s27 +---------+--------+--+--------+--+   Summary: Right Carotid: The ECA appears <50% stenosed. The extracranial vessels were                near-normal with only minimal wall thickening or plaque. Left Carotid: The ECA appears <50% stenosed. The extracranial vessels were               near-normal with only minimal wall thickening or plaque. Vertebrals:  Bilateral vertebral arteries demonstrate antegrade  flow. Subclavians: Normal flow hemodynamics were seen in bilateral subclavian              arteries. *See table(s) above for measurements and observations.  Electronically signed by Sherald Hess MD on 05/27/2023 at 3:05:03 PM.    Final    ECHOCARDIOGRAM COMPLETE BUBBLE STUDY  Result Date: 05/27/2023    ECHOCARDIOGRAM REPORT   Patient Name:   Craig Schmidt Date of Exam: 05/27/2023 Medical Rec #:  [id number]          Height:       67.0 in Accession #:    [id number]         Weight:       151.9 lb Date of Birth:  21-Dec-1970           BSA:          1.799 m Patient Age:    52 years           BP:           119/87 mmHg Patient Gender: M                  HR:           105 bpm. Exam Location:  Inpatient Procedure: 2D Echo, Cardiac Doppler, Color Doppler and Intracardiac            Opacification Agent Indications:    Stroke  History:        Patient has prior history of Echocardiogram examinations, most  recent 11/29/2022. CHF, CAD and Previous Myocardial Infarction;                 Stroke.  Sonographer:    Darlys Gales Referring Phys: [id number] [facility]  Sonographer Comments: Patient had previous negative bubble study. IMPRESSIONS  1. Left ventricular ejection fraction, by estimation, is <20%. The left ventricle has severely decreased function. The left ventricle demonstrates global hypokinesis. The left ventricular internal cavity size was moderately dilated. Left ventricular diastolic parameters are consistent with Grade II diastolic dysfunction (pseudonormalization).  2. Right ventricular systolic function is at least moderately reduced. The right ventricular size is mildly enlarged.  3. Left atrial size is moderate to severely dilated.  4. The mitral valve is thickened with limited movement of the valve leaflets (cannot r/o rheumatic mitral valve stenosis). There is a mean gradient of consistent with severe mitral stenosis.  5. Tricuspid valve regurgitation is moderate to severe.  6. The  aortic valve is normal in structure. Aortic valve regurgitation is not visualized. FINDINGS  Left Ventricle: Left ventricular ejection fraction, by estimation, is <20%. The left ventricle has severely decreased function. The left ventricle demonstrates global hypokinesis. Definity contrast agent was given IV to delineate the left ventricular endocardial borders. The left ventricular internal cavity size was moderately dilated. There is no left ventricular hypertrophy. Left ventricular diastolic parameters are consistent with Grade II diastolic dysfunction (pseudonormalization). Right Ventricle: The right ventricular size is mildly enlarged. No increase in right ventricular wall thickness. Right ventricular systolic function is moderately reduced. Left Atrium: Left atrial size was moderately dilated. Right Atrium: Right atrial size was normal in size. Pericardium: There is no evidence of pericardial effusion. Mitral Valve: The mitral valve is abnormal. Moderate mitral valve regurgitation. Moderate to severe mitral valve stenosis. MV peak gradient, 19.2 mmHg. The mean mitral valve gradient is 10.7 mmHg. Tricuspid Valve: The tricuspid valve is normal in structure. Tricuspid valve regurgitation is moderate to severe. Aortic Valve: The aortic valve is normal in structure. Aortic valve regurgitation is not visualized. Aortic valve mean gradient measures 4.0 mmHg. Aortic valve peak gradient measures 5.8 mmHg. Aortic valve area, by VTI measures 2.93 cm. Pulmonic Valve: The pulmonic valve was not well visualized. Pulmonic valve regurgitation is not visualized. Aorta: The aortic root is normal in size and structure. IAS/Shunts: No atrial level shunt detected by color flow Doppler.  LEFT VENTRICLE PLAX 2D LVIDd:         5.20 cm LVIDs:         4.90 cm LV PW:         1.00 cm LV IVS:        1.00 cm LVOT diam:     1.90 cm LV SV:         42 LV SV Index:   23 LVOT Area:     2.84 cm  RIGHT VENTRICLE RV S prime:     8.16 cm/s LEFT  ATRIUM              Index        RIGHT ATRIUM           Index LA Vol (A2C):   109.0 ml 60.59 ml/m  RA Area:     20.50 cm LA Vol (A4C):   68.4 ml  38.02 ml/m  RA Volume:   58.70 ml  32.63 ml/m LA Biplane Vol: 91.0 ml  50.58 ml/m  AORTIC VALVE AV Area (Vmax):    2.39 cm AV Area (Vmean):  2.43 cm AV Area (VTI):     2.93 cm AV Vmax:           120.00 cm/s AV Vmean:          93.400 cm/s AV VTI:            0.143 m AV Peak Grad:      5.8 mmHg AV Mean Grad:      4.0 mmHg LVOT Vmax:         101.00 cm/s LVOT Vmean:        80.100 cm/s LVOT VTI:          0.148 m LVOT/AV VTI ratio: 1.03  AORTA Ao Root diam: 2.60 cm MITRAL VALVE                TRICUSPID VALVE MV Area (PHT): 2.24 cm     TR Peak grad:   51.3 mmHg MV Area VTI:   1.21 cm     TR Vmax:        358.00 cm/s MV Peak grad:  19.2 mmHg MV Mean grad:  10.7 mmHg    SHUNTS MV Vmax:       2.19 m/s     Systemic VTI:  0.15 m MV Vmean:      158.3 cm/s   Systemic Diam: 1.90 cm MV Decel Time: 338 msec MV E velocity: 170.00 cm/s MV A velocity: 152.00 cm/s MV E/A ratio:  1.12 Aditya Sabharwal Electronically signed by Dorthula Nettles Signature Date/Time: 05/27/2023/9:47:42 AM    Final    MR Lumbar Spine W Wo Contrast  Result Date: 05/26/2023 CLINICAL DATA:  Back pain, history of drug use. Concern for discitis/osteomyelitis, abscess EXAM: MRI LUMBAR SPINE WITHOUT AND WITH CONTRAST TECHNIQUE: Multiplanar and multiecho pulse sequences of the lumbar spine were obtained without and with intravenous contrast. CONTRAST:  7mL GADAVIST GADOBUTROL 1 MMOL/ML IV SOLN COMPARISON:  Lumbar spine MRI 03/28/2022 FINDINGS: Segmentation: Standard; the lowest formed disc space is designated L5-S1. Alignment:  Normal. Vertebrae: Vertebral body heights are preserved. Background marrow signal is normal. There is no suspicious marrow signal abnormality or marrow edema. There is no evidence of discitis/osteomyelitis. Conus medullaris and cauda equina: Conus extends to the L1 level. Conus and cauda  equina appear normal. Paraspinal and other soft tissues: Unremarkable. Disc levels: There is mild disc desiccation at L3-L4 and L4-L5 with mild bulges and mild facet arthropathy at these levels. The other disc heights are preserved. There is no significant spinal canal or neural foraminal stenosis at any level. IMPRESSION: No evidence of discitis/osteomyelitis or significant degenerative change in the lumbar spine. Electronically Signed   By: Lesia Hausen M.D.   On: 05/26/2023 16:45   MR BRAIN WO CONTRAST  Result Date: 05/26/2023 CLINICAL DATA:  Neuro deficit, stroke suspected EXAM: MRI HEAD WITHOUT CONTRAST TECHNIQUE: Multiplanar, multiecho pulse sequences of the brain and surrounding structures were obtained without intravenous contrast. COMPARISON:  CT head 1 day prior FINDINGS: Brain: There are punctate foci of cortical diffusion restriction in the bilateral frontal lobes (5-80, 7-60) suspicious for tiny acute infarcts. There is no acute intracranial hemorrhage or extra-axial fluid collection There is age accelerated parenchymal volume loss with prominence of the ventricular system and extra-axial CSF spaces. There is multifocal encephalomalacia in the bilateral frontal, parietal, and occipital lobes consistent with prior infarcts. There are additional small remote infarcts in the right caudate and left cerebellar hemisphere. There are associated curvilinear chronic blood products overlying the left cerebral hemisphere as well as a  punctate chronic microhemorrhage in the brainstem, nonspecific. The pituitary and suprasellar region are normal. 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 There is no other mass lesion. There is no mass effect or midline shift. Vascular: Normal flow voids. Skull and upper cervical spine: Normal marrow signal. Sinuses/Orbits: The paranasal sinuses are clear. The globes and orbits are unremarkable. Other: The mastoid air cells and middle ear  cavities are clear. IMPRESSION: 1. Two possible tiny acute cortical infarcts in the bilateral frontal lobes. 2. No other evidence of acute intracranial pathology. The left frontoparietal infarct described on the CT head from 1 day prior is remote. 3. Numerous additional remote infarcts and age accelerated parenchymal volume loss as above. 4. Unchanged left vestibular schwannoma. Electronically Signed   By: Lesia Hausen M.D.   On: 05/26/2023 16:40   CT Head Wo Contrast  Result Date: 05/25/2023 CLINICAL DATA:  Neuro deficit, acute, stroke suspected Mental status change, unknown cause right arm paresthesia EXAM: CT HEAD WITHOUT CONTRAST TECHNIQUE: Contiguous axial images were obtained from the base of the skull through the vertex without intravenous contrast. RADIATION DOSE REDUCTION: This exam was performed according to the departmental dose-optimization program which includes automated exposure control, adjustment of the mA and/or kV according to patient size and/or use of iterative reconstruction technique. COMPARISON:  CT head 10/03/2021 FINDINGS: Brain: Cerebral ventricle sizes are concordant with the degree of cerebral volume loss. Interval development of loss of left frontal gray-white matter differentiation along the precentral gyrus and anteriorly suggestive of acute infarction. Finding extends minimally to the postcentral gyrus. No parenchymal hemorrhage. No mass lesion. No extra-axial collection. No mass effect or midline shift. No hydrocephalus. Basilar cisterns are patent. Mega cisterna magna. Vascular: No hyperdense vessel. Skull: No acute fracture or focal lesion. Sinuses/Orbits: Paranasal sinuses and mastoid air cells are clear. The orbits are unremarkable. Other: None. IMPRESSION: 1. Acute left frontoparietal infarction. Recommend MRI noncontrast for further evaluation 2. No acute intracranial hemorrhage. These results were called by telephone at the time of interpretation on 05/25/2023 at 3:58 pm  to provider Rexford Maus, DO, who verbally acknowledged these results. Electronically Signed   By: Tish Frederickson M.D.   On: 05/25/2023 15:58   DG Chest 2 View  Result Date: 05/25/2023 CLINICAL DATA:  Chest pain EXAM: CHEST - 2 VIEW COMPARISON:  Chest x-ray dated Dec 02, 2022 FINDINGS: Unchanged cardiomegaly. New consolidation of the anterior right lower lobe. Left lower lobe consolidation, also seen on prior exam. New mild diffuse interstitial opacities. No evidence of pneumothorax. No pleural effusion. IMPRESSION: 1. New consolidation of the anterior right lower lobe, concerning for infection or aspiration. 2. Left lower lobe consolidation. Further evaluation with chest CT recommended given lack of resolution when compared with the prior. 3. New mild diffuse interstitial opacities which could be due to a component of pulmonary edema. 4. Cardiomegaly. Electronically Signed   By: Allegra Lai M.D.   On: 05/25/2023 15:18    Microbiology: Results for orders placed or performed during the hospital encounter of 06/19/23  Resp panel by RT-PCR (RSV, Flu A&B, Covid) Anterior Nasal Swab     Status: None   Collection Time: 06/19/23  5:01 PM   Specimen: Anterior Nasal Swab  Result Value Ref Range Status   SARS Coronavirus 2 by RT PCR NEGATIVE NEGATIVE Final    Comment: (NOTE) SARS-CoV-2 target nucleic acids are NOT DETECTED.  The SARS-CoV-2 RNA is generally detectable in upper respiratory specimens during the  acute phase of infection. The lowest concentration of SARS-CoV-2 viral copies this assay can detect is 138 copies/mL. A negative result does not preclude SARS-Cov-2 infection and should not be used as the sole basis for treatment or other patient management decisions. A negative result may occur with  improper specimen collection/handling, submission of specimen other than nasopharyngeal swab, presence of viral mutation(s) within the areas targeted by this assay, and inadequate number  of viral copies(<138 copies/mL). A negative result must be combined with clinical observations, patient history, and epidemiological information. The expected result is Negative.  Fact Sheet for Patients:  BloggerCourse.com  Fact Sheet for Healthcare Providers:  SeriousBroker.it  This test is no t yet approved or cleared by the [place] FDA and  has been authorized for detection and/or diagnosis of SARS-CoV-2 by FDA under an Emergency Use Authorization (EUA). This EUA will remain  in effect (meaning this test can be used) for the duration of the COVID-19 declaration under Section 564(b)(1) of the Act, 21 U.S.C.section 360bbb-3(b)(1), unless the authorization is terminated  or revoked sooner.       Influenza A by PCR NEGATIVE NEGATIVE Final   Influenza B by PCR NEGATIVE NEGATIVE Final    Comment: (NOTE) The Xpert Xpress SARS-CoV-2/FLU/RSV plus assay is intended as an aid in the diagnosis of influenza from Nasopharyngeal swab specimens and should not be used as a sole basis for treatment. Nasal washings and aspirates are unacceptable for Xpert Xpress SARS-CoV-2/FLU/RSV testing.  Fact Sheet for Patients: BloggerCourse.com  Fact Sheet for Healthcare Providers: SeriousBroker.it  This test is not yet approved or cleared by the [place] FDA and has been authorized for detection and/or diagnosis of SARS-CoV-2 by FDA under an Emergency Use Authorization (EUA). This EUA will remain in effect (meaning this test can be used) for the duration of the COVID-19 declaration under Section 564(b)(1) of the Act, 21 U.S.C. section 360bbb-3(b)(1), unless the authorization is terminated or revoked.     Resp Syncytial Virus by PCR NEGATIVE NEGATIVE Final    Comment: (NOTE) Fact Sheet for Patients: BloggerCourse.com  Fact Sheet for Healthcare  Providers: SeriousBroker.it  This test is not yet approved or cleared by the [place] FDA and has been authorized for detection and/or diagnosis of SARS-CoV-2 by FDA under an Emergency Use Authorization (EUA). This EUA will remain in effect (meaning this test can be used) for the duration of the COVID-19 declaration under Section 564(b)(1) of the Act, 21 U.S.C. section 360bbb-3(b)(1), unless the authorization is terminated or revoked.  Performed at [facility], [address] [place], [place] [address]   Culture, blood (Routine X 2) w Reflex to ID Panel     Status: None (Preliminary result)   Collection Time: 06/20/23 12:30 AM   Specimen: BLOOD  Result Value Ref Range Status   Specimen Description   Final    BLOOD LEFT ANTECUBITAL Performed at [facility], [address], [place], [place] [address]    Special Requests   Final    BOTTLES DRAWN AEROBIC AND ANAEROBIC Blood Culture results may not be optimal due to an inadequate volume of blood received in culture bottles Performed at [facility], [address], [place], [place] [address]    Culture   Final    NO GROWTH 2 DAYS Performed at [facility], [address] [place], [place] [address]    Report Status PENDING  Incomplete  Culture, blood (Routine X 2) w Reflex to  ID Panel     Status: None (Preliminary result)   Collection Time: 06/20/23 12:35 AM   Specimen: BLOOD  Result Value Ref Range Status   Specimen Description   Final    BLOOD BLOOD LEFT FOREARM Performed at [facility], [address] [place], [place] [address]    Special Requests   Final    BOTTLES DRAWN AEROBIC AND ANAEROBIC Blood Culture results may not be optimal due to an inadequate volume of blood received in culture bottles Performed at [facility], [address], [place], [place] [address]    Culture   Final    NO  GROWTH 2 DAYS Performed at [facility], [address] [place], [place] [address]    Report Status PENDING  Incomplete  MRSA Next Gen by PCR, Nasal     Status: None   Collection Time: 06/20/23  2:16 PM   Specimen: Nasal Mucosa; Nasal Swab  Result Value Ref Range Status   MRSA by PCR Next Gen NOT DETECTED NOT DETECTED Final    Comment: (NOTE) The GeneXpert MRSA Assay (FDA approved for NASAL specimens only), is one component of a comprehensive MRSA colonization surveillance program. It is not intended to diagnose MRSA infection nor to guide or monitor treatment for MRSA infections. Test performance is not FDA approved in patients less than 69 years old. Performed at [facility] [address], [place], [place] [address]     Labs: CBC: Recent Labs  Lab 06/16/23 2334 06/19/23 1700 06/20/23 0512 06/22/23 0603  WBC 11.1* 10.5 8.2 8.4  NEUTROABS 7.6 8.0*  --  5.8  HGB 11.5* 12.8* 9.8* 11.0*  HCT 36.7* 40.7 31.1* 35.0*  MCV 92.4 94.2 91.7 91.6  PLT 647* 483* 499* 378   Basic Metabolic Panel: Recent Labs  Lab 06/16/23 2334 06/19/23 1700 06/20/23 0512 06/22/23 0603  NA 137 141 138 136  K 4.4 5.4* 4.5 4.6  CL 103 106 104 100  CO2 26 23 25 25   GLUCOSE 102* 79 88 109*  BUN 27* 25* 26* 29*  CREATININE 1.35* 1.24 1.28* 1.49*  CALCIUM 8.9 9.6 9.1 8.9  MG  --   --   --  2.2  PHOS  --   --   --  4.5   Liver Function Tests: Recent Labs  Lab 06/16/23 2334 06/19/23 1700 06/20/23 0512 06/22/23 0603  AST 34 35 23 25  ALT 15 13 11 11   ALKPHOS 152* 143* 116 113  BILITOT 0.5 0.8 0.9 0.5  PROT 8.4* 8.9* 7.1 7.3  ALBUMIN 3.0* 3.4* 2.7* 2.8*   CBG: No results for input(s): "GLUCAP" in the last 168 hours.  Discharge time spent: greater than 30 minutes.  Signed: Debarah Crape, DO [facility] 06/22/2023

## 2023-06-22 NOTE — Hospital Course (Signed)
Craig Schmidt is 52 y.o. male with a complicated past medical history complicated by homelessness for 30 years.  Patient has history of crack cocaine abuse, alcohol abuse, medication nonadherence, CAD, hypertension, hyperlipidemia, cardiomyopathy, prior NSTEMI, severe heart failure with reduced EF (less than 20%), prior CVA, hyperlipidemia, CKD stage IIIA who presented to the ED on this visit complaining of chest pain.  On arrival to the ED he was tachycardic and tachypneic but normotensive.  BNP elevated at 1415, UDS positive for cocaine endorses smoking crack the day prior to arrival.  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   Patient has had multiple ED visits over the last 3 weeks.  Recent visits as follows: 11/2 admitted for multifocal pneumonia and left AMA on 11/8 11/12 admitted for sepsis secondary to multifocal pneumonia and acute hypoxic respiratory failure.  Discharged 11/18. 11/19 admitted for persistent multifocal pneumonia and discharged on 11/21. 11/24 presented to ED for right flank pain and discharged home from ED.  On 11/27 patient was admitted for treatment of left lower lobe liquefication necrosis.  Pulmonology was consulted.  It did appear that patient's pneumonia was improving due to resolution of leukocytosis and no fevers during this admission.  Patient was initially started on vancomycin and cefepime.  Pulmonology recommend switching to Unasyn on 11/28.  Patient tolerated Unasyn well without any further elevation of WBC and no fevers.  Patient was then switched to Augmentin which he will need to continue for minimum 28 days.  He needs follow-up with pulmonology in 3 weeks for repeat CT scan and at that time they will determine final antibiotic duration.  He may require up to 6 weeks total  of antibiotic therapy.  This was all discussed extensively with the patient at bedside as well as with his girlfriend whom he reports is a helpful caretaker.  Medications were sent to the transition of care pharmacy prior to discharge and we discussed the importance of adherence.  TOC met with the patient and helped to arrange transportation on day of discharge.

## 2023-06-22 NOTE — Progress Notes (Signed)
Discharge medications delivered to pt in room by this RN. Meds are in a secure bag

## 2023-06-22 NOTE — TOC Transition Note (Signed)
Transition of Care [facility]) - CM/SW Discharge Note   Patient Details  Name: Craig Schmidt MRN: [id number] Date of Birth: 1971/07/21  Transition of Care [facility] CM/SW Contact:  Adrian Prows, RN Phone Number: 06/22/2023, 1:29 PM   Clinical Narrative:    D/C orders received; pt says he needs a taxi voucher because he cannot transport all of his belongings on bus; pt says he does not have money to pay for transportation; Therapist, nutritional and Release of Liability signed; he says his d/c address is [address] [place] [id number]; pt also given copies of resources from d/c instructions; taxi voucher given to Darl Pikes, [place]; pt's RN will call [employer] to arrange transportation; no TOC needs.   Final next level of care: Homeless Shelter Barriers to Discharge: No Barriers Identified   Patient Goals and CMS Choice CMS Medicare.gov Compare Post Acute Care list provided to::  (NA) Choice offered to / list presented to : Patient  Discharge Placement                         Discharge Plan and Services Additional resources added to the After Visit Summary for   In-house Referral: Clinical Social Work Discharge Planning Services: NA Post Acute Care Choice: NA          DME Arranged: N/A DME Agency: NA                  Social Determinants of Health (SDOH) Interventions SDOH Screenings   Food Insecurity: Food Insecurity Present (06/20/2023)  Housing: High Risk (06/20/2023)  Transportation Needs: Unmet Transportation Needs (06/20/2023)  Utilities: At Risk (06/20/2023)  Alcohol Screen: High Risk (09/19/2021)  Depression (PHQ2-9): High Risk (06/13/2022)  Financial Resource Strain: High Risk (09/07/2022)  Stress: Stress Concern Present (10/16/2022)  Tobacco Use: High Risk (06/19/2023)     Readmission Risk Interventions    06/21/2023   12:50 PM  Readmission Risk Prevention Plan  Transportation Screening Complete  Medication Review (RN Care Manager) Complete  PCP or  Specialist appointment within 3-5 days of discharge Complete  HRI or Home Care Consult Complete  SW Recovery Care/Counseling Consult Complete  Palliative Care Screening Not Applicable  Skilled Nursing Facility Not Applicable

## 2023-06-22 NOTE — Plan of Care (Signed)
  Problem: Education: Goal: Knowledge of General Education information will improve Description: Including pain rating scale, medication(s)/side effects and non-pharmacologic comfort measures Outcome: Progressing   Problem: Health Behavior/Discharge Planning: Goal: Ability to manage health-related needs will improve Outcome: Progressing   Problem: Clinical Measurements: Goal: Will remain free from infection Outcome: Progressing Goal: Diagnostic test results will improve Outcome: Progressing Goal: Respiratory complications will improve Outcome: Progressing   Problem: Pain Management: Goal: General experience of comfort will improve Outcome: Progressing

## 2023-06-22 NOTE — Plan of Care (Signed)
  Problem: Clinical Measurements: Goal: Will remain free from infection Outcome: Progressing   Problem: Nutrition: Goal: Adequate nutrition will be maintained Outcome: Progressing   Problem: Elimination: Goal: Will not experience complications related to urinary retention Outcome: Progressing

## 2023-06-24 ENCOUNTER — Telehealth: Payer: Self-pay

## 2023-06-24 ENCOUNTER — Telehealth: Payer: Self-pay | Admitting: *Deleted

## 2023-06-24 NOTE — Telephone Encounter (Signed)
CMA Craig Schmidt has it in provider folder  [employer] papers

## 2023-06-24 NOTE — Transitions of Care (Post Inpatient/ED Visit) (Signed)
   06/24/2023  Name: Craig Schmidt MRN: [id number] DOB: Aug 11, 1970  Today's TOC FU Call Status: Unsuccessful Call (1st Attempt) Date: 06/24/23  Attempted to reach the patient regarding the most recent Inpatient/ED visit.  Follow Up Plan: Additional outreach attempts will be made to reach the patient to complete the Transitions of Care (Post Inpatient/ED visit) call.   Signature  Robyne Peers, RN

## 2023-06-25 ENCOUNTER — Telehealth: Payer: Self-pay

## 2023-06-25 DIAGNOSIS — J189 Pneumonia, unspecified organism: Secondary | ICD-10-CM

## 2023-06-25 LAB — CULTURE, BLOOD (ROUTINE X 2)
Culture: NO GROWTH
Culture: NO GROWTH

## 2023-06-25 NOTE — Telephone Encounter (Signed)
Noted. Anjelica, please advise request for cab services.

## 2023-06-25 NOTE — Telephone Encounter (Signed)
Noted  

## 2023-06-25 NOTE — Telephone Encounter (Signed)
Amy- FYI from the [facility] call:  Craig Schmidt has all of his medications and did not have any questions about the meds. Craig Schmidt said Craig Schmidt needs a new pill box. I explained to him that it may benefit him to have his medications prepared in a bubble pack and Craig Schmidt was open to that option. The pills are less likely to spill as they might from a pill box. I told him that I would share this with PCP.   [facility] is an option for bubble packing.    Craig Schmidt has an appointment with you 07/02/2023.   Craig Schmidt said Craig Schmidt will need a ride to the appt. Can a cab be arranged for him after his appt is confirmed and Craig Schmidt knows where Craig Schmidt will be for pick up by the cab company?

## 2023-06-25 NOTE — Telephone Encounter (Signed)
Sent message to Cloretta Ned to call the patient and schedule the cab for DOS 07/02/23

## 2023-06-25 NOTE — Transitions of Care (Post Inpatient/ED Visit) (Signed)
06/25/2023  Name: Craig Schmidt MRN: [id number] DOB: 1970-11-15  Today's TOC FU Call Status: Today's TOC FU Call Status:: Successful TOC FU Call Completed Unsuccessful Call (1st Attempt) Date: 06/24/23 [facility] FU Call Complete Date: 06/25/23 Patient's Name and Date of Birth confirmed.  Transition Care Management Follow-up Telephone Call Date of Discharge: 06/22/23 Discharge Facility: [facility]) Type of Discharge: Inpatient Admission Primary Inpatient Discharge Diagnosis:: multifocal pneumonia How have you been since you were released from the hospital?: Same Any questions or concerns?: Yes Patient Questions/Concerns:: He is concerned that he is still homeless and living on the street. He said he has been on the streets for 30 years. He went on to say that the caseworkers that had been trying to help him find housing have dropped him and he said they told him that they can't take him back. I asked him if he would return to [facility] and he would not answere.  I told him that I will refer him to [facility] again but he needs to make sure to answer his phone and/or check his messages. he said he does not like to answer calls when he doesn't recognize the phone number. Patient Questions/Concerns Addressed: Other: (referred to [facility])  Items Reviewed: Did you receive and understand the discharge instructions provided?: Yes Medications obtained,verified, and reconciled?: Yes (Medications Reviewed) Reason for Partial Mediation Review: He has all of his medications and did not have any questions about the meds. He said he needs a new pill box.  I explained to him that it may benefit him to have his medications prepared in a bubble pack and he was open to that option. The pills are less likely to spill as they might from a pill box. I told him that I would share this with PCP Any new allergies since your discharge?: No Dietary orders reviewed?: Yes Type of Diet Ordered:: Heart healthy, low sodium-  difficult to adhere to as he is homeless. Do you have support at home?: Yes People in Home: significant other Name of Support/Comfort Primary Source: his girlfriend who stays on the street with him  Medications Reviewed Today: Medications Reviewed Today     Reviewed by Robyne Peers, RN (Case Manager) on 06/25/23 at 1059  Med List Status: <None>   Medication Order Taking? Sig Documenting Provider Last Dose Status Informant  amoxicillin-clavulanate (AUGMENTIN) 875-125 MG tablet [id number]  Take 1 tablet by mouth every 12 (twelve) hours for 28 days. Alberteen Sam, MD  Active     Discontinued 06/22/23 0902 (Discontinued by provider)            Med Note>> Zoe Lan, [facility]   06/22/2023  9:02 AM provider call and asked to cancel    atorvastatin (LIPITOR) 40 MG tablet [id number] No Take 1 tablet (40 mg total) by mouth daily. Marinda Elk, MD 06/19/2023 Active Self, Pharmacy Records  carvedilol (COREG) 3.125 MG tablet [id number] No Take 1 tablet (3.125 mg total) by mouth 2 (two) times daily. Marinda Elk, MD 06/19/2023 Active Self, Pharmacy Records  clopidogrel (PLAVIX) 75 MG tablet [id number] No Take 1 tablet (75 mg total) by mouth daily. Marinda Elk, MD 06/19/2023 Active Self, Pharmacy Records  furosemide (LASIX) 40 MG tablet [id number]  Take 1 tablet (40 mg total) by mouth daily. Danford, Earl Lites, MD  Active   umeclidinium bromide (INCRUSE ELLIPTA) 62.5 MCG/ACT AEPB [id number]  Inhale 1 puff into the lungs daily. Danford, Earl Lites, MD  Active  Home Care and Equipment/Supplies: Were Home Health Services Ordered?: No Any new equipment or medical supplies ordered?: No  Functional Questionnaire: Do you need assistance with bathing/showering or dressing?: No Do you need assistance with meal preparation?: Yes (currenlty homeless) Do you need assistance with eating?: No Do you have difficulty maintaining continence: No Do you need  assistance with getting out of bed/getting out of a chair/moving?: Yes (ambulates with a rollator.) Do you have difficulty managing or taking your medications?: Yes (his girlfriend assists him.)  Follow up appointments reviewed: PCP Follow-up appointment confirmed?: Yes Date of PCP follow-up appointment?: 07/02/23 Follow-up Provider: Ricky Stabs, NP Specialist Hospital Follow-up appointment confirmed?: No Reason Specialist Follow-Up Not Confirmed:  (has been referred to pulmonary but he has not been contacted about an appointment yet) Do you need transportation to your follow-up appointment?: Yes Transportation Need Intervention Addressed By:: Other: (I will ask PCE to schedule a ride for him as he said he has difficulty arranging a ride.  I text him the phone number for [employer] transportation last week and he said he has a hard time managing messages so he needs help with arranging a ride.) Do you understand care options if your condition(s) worsen?: Yes-patient verbalized understanding    SIGNATURE Robyne Peers, RN

## 2023-06-26 ENCOUNTER — Other Ambulatory Visit: Payer: Self-pay

## 2023-06-26 ENCOUNTER — Emergency Department (HOSPITAL_COMMUNITY): Payer: Medicaid Other

## 2023-06-26 ENCOUNTER — Encounter (HOSPITAL_COMMUNITY): Payer: Self-pay | Admitting: Emergency Medicine

## 2023-06-26 ENCOUNTER — Inpatient Hospital Stay (HOSPITAL_COMMUNITY)
Admission: EM | Admit: 2023-06-26 | Discharge: 2023-07-04 | DRG: 177 | Disposition: A | Discharge status: Home / Self Care | Payer: Medicaid Other | Attending: Internal Medicine | Admitting: Internal Medicine

## 2023-06-26 DIAGNOSIS — F1990 Other psychoactive substance use, unspecified, uncomplicated: Secondary | ICD-10-CM

## 2023-06-26 DIAGNOSIS — Z7902 Long term (current) use of antithrombotics/antiplatelets: Secondary | ICD-10-CM

## 2023-06-26 DIAGNOSIS — I2510 Atherosclerotic heart disease of native coronary artery without angina pectoris: Secondary | ICD-10-CM | POA: Diagnosis present

## 2023-06-26 DIAGNOSIS — F1910 Other psychoactive substance abuse, uncomplicated: Secondary | ICD-10-CM | POA: Diagnosis present

## 2023-06-26 DIAGNOSIS — F1490 Cocaine use, unspecified, uncomplicated: Secondary | ICD-10-CM | POA: Diagnosis present

## 2023-06-26 DIAGNOSIS — I428 Other cardiomyopathies: Secondary | ICD-10-CM | POA: Diagnosis present

## 2023-06-26 DIAGNOSIS — Z91013 Allergy to seafood: Secondary | ICD-10-CM

## 2023-06-26 DIAGNOSIS — I080 Rheumatic disorders of both mitral and aortic valves: Secondary | ICD-10-CM | POA: Diagnosis present

## 2023-06-26 DIAGNOSIS — J159 Unspecified bacterial pneumonia: Secondary | ICD-10-CM | POA: Diagnosis present

## 2023-06-26 DIAGNOSIS — N1831 Chronic kidney disease, stage 3a: Secondary | ICD-10-CM | POA: Diagnosis present

## 2023-06-26 DIAGNOSIS — N179 Acute kidney failure, unspecified: Secondary | ICD-10-CM | POA: Diagnosis present

## 2023-06-26 DIAGNOSIS — J189 Pneumonia, unspecified organism: Principal | ICD-10-CM | POA: Diagnosis present

## 2023-06-26 DIAGNOSIS — J440 Chronic obstructive pulmonary disease with acute lower respiratory infection: Secondary | ICD-10-CM | POA: Diagnosis present

## 2023-06-26 DIAGNOSIS — Z833 Family history of diabetes mellitus: Secondary | ICD-10-CM

## 2023-06-26 DIAGNOSIS — Z713 Dietary counseling and surveillance: Secondary | ICD-10-CM

## 2023-06-26 DIAGNOSIS — Z6821 Body mass index (BMI) 21.0-21.9, adult: Secondary | ICD-10-CM

## 2023-06-26 DIAGNOSIS — Z5901 Sheltered homelessness: Secondary | ICD-10-CM

## 2023-06-26 DIAGNOSIS — R634 Abnormal weight loss: Secondary | ICD-10-CM | POA: Diagnosis present

## 2023-06-26 DIAGNOSIS — I69351 Hemiplegia and hemiparesis following cerebral infarction affecting right dominant side: Secondary | ICD-10-CM

## 2023-06-26 DIAGNOSIS — Z91010 Allergy to peanuts: Secondary | ICD-10-CM

## 2023-06-26 DIAGNOSIS — I252 Old myocardial infarction: Secondary | ICD-10-CM

## 2023-06-26 DIAGNOSIS — Z8673 Personal history of transient ischemic attack (TIA), and cerebral infarction without residual deficits: Secondary | ICD-10-CM

## 2023-06-26 DIAGNOSIS — F17210 Nicotine dependence, cigarettes, uncomplicated: Secondary | ICD-10-CM | POA: Diagnosis present

## 2023-06-26 DIAGNOSIS — E785 Hyperlipidemia, unspecified: Secondary | ICD-10-CM | POA: Diagnosis present

## 2023-06-26 DIAGNOSIS — I255 Ischemic cardiomyopathy: Secondary | ICD-10-CM | POA: Diagnosis present

## 2023-06-26 DIAGNOSIS — Z79899 Other long term (current) drug therapy: Secondary | ICD-10-CM

## 2023-06-26 DIAGNOSIS — Z7901 Long term (current) use of anticoagulants: Secondary | ICD-10-CM

## 2023-06-26 DIAGNOSIS — I10 Essential (primary) hypertension: Secondary | ICD-10-CM | POA: Diagnosis present

## 2023-06-26 DIAGNOSIS — I5042 Chronic combined systolic (congestive) and diastolic (congestive) heart failure: Secondary | ICD-10-CM | POA: Diagnosis present

## 2023-06-26 DIAGNOSIS — J850 Gangrene and necrosis of lung: Principal | ICD-10-CM | POA: Diagnosis present

## 2023-06-26 DIAGNOSIS — I130 Hypertensive heart and chronic kidney disease with heart failure and stage 1 through stage 4 chronic kidney disease, or unspecified chronic kidney disease: Secondary | ICD-10-CM | POA: Diagnosis present

## 2023-06-26 LAB — CBC WITH DIFFERENTIAL/PLATELET
Abs Immature Granulocytes: 0.11 10*3/uL — ABNORMAL HIGH (ref 0.00–0.07)
Basophils Absolute: 0.1 10*3/uL (ref 0.0–0.1)
Basophils Relative: 0 %
Eosinophils Absolute: 1 10*3/uL — ABNORMAL HIGH (ref 0.0–0.5)
Eosinophils Relative: 5 %
HCT: 36.6 % — ABNORMAL LOW (ref 39.0–52.0)
Hemoglobin: 11.4 g/dL — ABNORMAL LOW (ref 13.0–17.0)
Immature Granulocytes: 1 %
Lymphocytes Relative: 15 %
Lymphs Abs: 2.7 10*3/uL (ref 0.7–4.0)
MCH: 28.9 pg (ref 26.0–34.0)
MCHC: 31.1 g/dL (ref 30.0–36.0)
MCV: 92.7 fL (ref 80.0–100.0)
Monocytes Absolute: 2.8 10*3/uL — ABNORMAL HIGH (ref 0.1–1.0)
Monocytes Relative: 15 %
Neutro Abs: 11.9 10*3/uL — ABNORMAL HIGH (ref 1.7–7.7)
Neutrophils Relative %: 64 %
Platelets: 411 10*3/uL — ABNORMAL HIGH (ref 150–400)
RBC: 3.95 MIL/uL — ABNORMAL LOW (ref 4.22–5.81)
RDW: 15.5 % (ref 11.5–15.5)
WBC: 18.5 10*3/uL — ABNORMAL HIGH (ref 4.0–10.5)
nRBC: 0 % (ref 0.0–0.2)

## 2023-06-26 LAB — COMPREHENSIVE METABOLIC PANEL
ALT: 11 U/L (ref 0–44)
AST: 29 U/L (ref 15–41)
Albumin: 3.3 g/dL — ABNORMAL LOW (ref 3.5–5.0)
Alkaline Phosphatase: 143 U/L — ABNORMAL HIGH (ref 38–126)
Anion gap: 14 (ref 5–15)
BUN: 27 mg/dL — ABNORMAL HIGH (ref 6–20)
CO2: 21 mmol/L — ABNORMAL LOW (ref 22–32)
Calcium: 9.4 mg/dL (ref 8.9–10.3)
Chloride: 103 mmol/L (ref 98–111)
Creatinine, Ser: 1.29 mg/dL — ABNORMAL HIGH (ref 0.61–1.24)
GFR, Estimated: >60 mL/min (ref >60)
Glucose, Bld: 91 mg/dL (ref 70–99)
Potassium: 4.7 mmol/L (ref 3.5–5.1)
Sodium: 138 mmol/L (ref 135–145)
Total Bilirubin: 0.7 mg/dL (ref <1.2)
Total Protein: 8.1 g/dL (ref 6.5–8.1)

## 2023-06-26 LAB — LACTIC ACID, PLASMA: Lactic Acid, Venous: 2.1 mmol/L (ref 0.5–1.9)

## 2023-06-26 LAB — BRAIN NATRIURETIC PEPTIDE: B Natriuretic Peptide: 711.9 pg/mL — ABNORMAL HIGH (ref 0.0–100.0)

## 2023-06-26 LAB — TROPONIN I (HIGH SENSITIVITY): Troponin I (High Sensitivity): 18 ng/L — ABNORMAL HIGH (ref <18)

## 2023-06-26 MED ORDER — SODIUM CHLORIDE 0.9 % IV SOLN
2.0000 g | Freq: Once | INTRAVENOUS | Status: AC
  Administered 2023-06-26: 2 g via INTRAVENOUS
  Filled 2023-06-26: qty 12.5

## 2023-06-26 MED ORDER — VANCOMYCIN HCL IN DEXTROSE 1-5 GM/200ML-% IV SOLN: 1000.0000 mg | Freq: Once | INTRAVENOUS | Status: DC

## 2023-06-26 MED ORDER — VANCOMYCIN HCL 1250 MG/250ML IV SOLN
1250.0000 mg | Freq: Once | INTRAVENOUS | Status: AC
Start: 2023-06-26 — End: 2023-06-27
  Administered 2023-06-27: 1250 mg via INTRAVENOUS
  Filled 2023-06-26: qty 250

## 2023-06-26 MED ORDER — IOHEXOL 350 MG/ML SOLN
80.0000 mL | Freq: Once | INTRAVENOUS | Status: AC | PRN
  Administered 2023-06-26: 80 mL via INTRAVENOUS

## 2023-06-26 NOTE — ED Notes (Signed)
1 set of blood cultures collected

## 2023-06-26 NOTE — ED Triage Notes (Signed)
PT reports he was just released and is having chest pain and SOB.

## 2023-06-26 NOTE — ED Notes (Signed)
Patient transported to CT 

## 2023-06-26 NOTE — Progress Notes (Signed)
ED Pharmacy Antibiotic Sign Off An antibiotic consult was received from an ED provider for Vancomycin and Cefepime per pharmacy dosing for pneumonia. A chart review was completed to assess appropriateness.   The following one time order(s) were placed:  Vancomycin 1250mg  IV x 1 Cefepime 2gm IV x 1  Further antibiotic and/or antibiotic pharmacy consults should be ordered by the admitting provider if indicated.   Thank you for allowing pharmacy to be a part of this patient's care.   Maryellen Pile, [facility]  Clinical Pharmacist 06/26/23 10:45 PM

## 2023-06-26 NOTE — ED Notes (Signed)
Unsuccessful IV attempt x2.  

## 2023-06-26 NOTE — ED Provider Notes (Signed)
[place] EMERGENCY DEPARTMENT AT [facility] Provider Note   CSN: [id number] Arrival date & time: 06/26/23  1956     History  Chief Complaint  Patient presents with   Chest Pain    KEYWAN REAU is a 52 y.o. male.  HPI   52 year old male presents emergency department with ongoing shortness of breath and productive cough.  History of multiple admissions for multifocal pneumonia.  Couple times he left AMA, couple times he was septic requiring IV antibiotics.  Since being home has been compliant with his antibiotics but states that the shortness of breath is worsening, productive cough continues, along with chills.  Patient denies any specific chest pain or back pain.  No swelling of his lower extremities.  Home Medications Prior to Admission medications   Medication Sig Start Date End Date Taking? Authorizing Provider  amoxicillin-clavulanate (AUGMENTIN) 875-125 MG tablet Take 1 tablet by mouth every 12 (twelve) hours for 28 days. 06/22/23 07/20/23  DanfordEarl Lites, MD  atorvastatin (LIPITOR) 40 MG tablet Take 1 tablet (40 mg total) by mouth daily. 06/10/23   Marinda Elk, MD  carvedilol (COREG) 3.125 MG tablet Take 1 tablet (3.125 mg total) by mouth 2 (two) times daily. 06/10/23   Marinda Elk, MD  clopidogrel (PLAVIX) 75 MG tablet Take 1 tablet (75 mg total) by mouth daily. 06/10/23   Marinda Elk, MD  furosemide (LASIX) 40 MG tablet Take 1 tablet (40 mg total) by mouth daily. 06/22/23 07/22/23  Danford, Earl Lites, MD  umeclidinium bromide (INCRUSE ELLIPTA) 62.5 MCG/ACT AEPB Inhale 1 puff into the lungs daily. 06/23/23 07/23/23  Danford, Earl Lites, MD  apixaban (ELIQUIS) 5 MG TABS tablet Take 1 tablet (5 mg total) by mouth 2 (two) times daily. 06/22/23 06/22/23  DanfordEarl Lites, MD      Allergies    Shellfish allergy and Peanut (diagnostic)    Review of Systems   Review of Systems  Constitutional:  Positive for  chills and fatigue. Negative for fever.  Respiratory:  Positive for cough and shortness of breath.   Cardiovascular:  Negative for chest pain and leg swelling.  Gastrointestinal:  Negative for abdominal pain, diarrhea and vomiting.  Skin:  Negative for rash.  Neurological:  Negative for headaches.    Physical Exam Updated Vital Signs BP (!) 125/92   Pulse 95   Temp 97.9 F (36.6 C) (Axillary)   Resp (!) 24   SpO2 99%  Physical Exam Vitals and nursing note reviewed.  Constitutional:      Appearance: Normal appearance. He is ill-appearing.  HENT:     Head: Normocephalic.     Mouth/Throat:     Mouth: Mucous membranes are moist.  Cardiovascular:     Rate and Rhythm: Normal rate.  Pulmonary:     Effort: Pulmonary effort is normal. Tachypnea present. No respiratory distress.     Breath sounds: Examination of the right-lower field reveals decreased breath sounds. Examination of the left-lower field reveals decreased breath sounds. Decreased breath sounds present.  Abdominal:     Palpations: Abdomen is soft.     Tenderness: There is no abdominal tenderness.  Musculoskeletal:     Right lower leg: No edema.     Left lower leg: No edema.  Skin:    General: Skin is warm.  Neurological:     Mental Status: He is alert and oriented to person, place, and time. Mental status is at baseline.  Psychiatric:  Mood and Affect: Mood normal.     ED Results / Procedures / Treatments   Labs (all labs ordered are listed, but only abnormal results are displayed) Labs Reviewed  COMPREHENSIVE METABOLIC PANEL - Abnormal; Notable for the following components:      Result Value   CO2 21 (*)    BUN 27 (*)    Creatinine, Ser 1.29 (*)    Albumin 3.3 (*)    Alkaline Phosphatase 143 (*)    All other components within normal limits  CBC WITH DIFFERENTIAL/PLATELET - Abnormal; Notable for the following components:   WBC 18.5 (*)    RBC 3.95 (*)    Hemoglobin 11.4 (*)    HCT 36.6 (*)     Platelets 411 (*)    Neutro Abs 11.9 (*)    Monocytes Absolute 2.8 (*)    Eosinophils Absolute 1.0 (*)    Abs Immature Granulocytes 0.11 (*)    All other components within normal limits  TROPONIN I (HIGH SENSITIVITY) - Abnormal; Notable for the following components:   Troponin I (High Sensitivity) 18 (*)    All other components within normal limits  CULTURE, BLOOD (ROUTINE X 2)  CULTURE, BLOOD (ROUTINE X 2)  BRAIN NATRIURETIC PEPTIDE  LACTIC ACID, PLASMA  LACTIC ACID, PLASMA  BRAIN NATRIURETIC PEPTIDE  TROPONIN I (HIGH SENSITIVITY)    EKG None  Radiology DG Chest 2 View  Result Date: 06/26/2023 CLINICAL DATA:  Chest pain and shortness of breath. EXAM: CHEST - 2 VIEW COMPARISON:  June 19, 2023 FINDINGS: The cardiac silhouette is mildly enlarged and unchanged in size. Mild, predominant stable patchy infiltrates are seen within the bilateral lung bases. No pleural effusion or pneumothorax is identified. The visualized skeletal structures are unremarkable. IMPRESSION: Mild bibasilar infiltrates. Electronically Signed   By: Aram Candela M.D.   On: 06/26/2023 21:22    Procedures Procedures    Medications Ordered in ED Medications - No data to display  ED Course/ Medical Decision Making/ A&P                                 Medical Decision Making Amount and/or Complexity of Data Reviewed Labs: ordered. Radiology: ordered.   52 year old male presents emergency department ongoing shortness of breath and cough.  Has been admitted multiple times before for multifocal pneumonia, sepsis.  Here with ongoing symptoms.  States that he is compliant with his oral antibiotic.  Afebrile on arrival but tachypneic during exam.  Diminished breath sounds bilaterally.  Blood work shows a leukocytosis of 18.  He has a baseline AKI. Chest XR is showing bibasilar infiltrates.   Concern for ongoing pulmonary infection. Will add on lactic acid, cultures, cardiac workup and CTPE. Patient  will most likely require admission.        Final Clinical Impression(s) / ED Diagnoses Final diagnoses:  None    Rx / DC Orders ED Discharge Orders     None         Rozelle Logan, DO 06/26/23 2247

## 2023-06-27 ENCOUNTER — Inpatient Hospital Stay (HOSPITAL_COMMUNITY): Payer: Medicaid Other

## 2023-06-27 DIAGNOSIS — J189 Pneumonia, unspecified organism: Secondary | ICD-10-CM

## 2023-06-27 DIAGNOSIS — F1990 Other psychoactive substance use, unspecified, uncomplicated: Secondary | ICD-10-CM

## 2023-06-27 DIAGNOSIS — I10 Essential (primary) hypertension: Secondary | ICD-10-CM | POA: Diagnosis not present

## 2023-06-27 DIAGNOSIS — N1831 Chronic kidney disease, stage 3a: Secondary | ICD-10-CM | POA: Diagnosis present

## 2023-06-27 DIAGNOSIS — R634 Abnormal weight loss: Secondary | ICD-10-CM | POA: Diagnosis present

## 2023-06-27 DIAGNOSIS — F17210 Nicotine dependence, cigarettes, uncomplicated: Secondary | ICD-10-CM | POA: Diagnosis present

## 2023-06-27 DIAGNOSIS — J440 Chronic obstructive pulmonary disease with acute lower respiratory infection: Secondary | ICD-10-CM | POA: Diagnosis present

## 2023-06-27 DIAGNOSIS — I080 Rheumatic disorders of both mitral and aortic valves: Secondary | ICD-10-CM | POA: Diagnosis present

## 2023-06-27 DIAGNOSIS — Z91010 Allergy to peanuts: Secondary | ICD-10-CM | POA: Diagnosis not present

## 2023-06-27 DIAGNOSIS — Z91013 Allergy to seafood: Secondary | ICD-10-CM | POA: Diagnosis not present

## 2023-06-27 DIAGNOSIS — Z8673 Personal history of transient ischemic attack (TIA), and cerebral infarction without residual deficits: Secondary | ICD-10-CM

## 2023-06-27 DIAGNOSIS — I2510 Atherosclerotic heart disease of native coronary artery without angina pectoris: Secondary | ICD-10-CM

## 2023-06-27 DIAGNOSIS — I255 Ischemic cardiomyopathy: Secondary | ICD-10-CM | POA: Diagnosis present

## 2023-06-27 DIAGNOSIS — I252 Old myocardial infarction: Secondary | ICD-10-CM | POA: Diagnosis not present

## 2023-06-27 DIAGNOSIS — I69351 Hemiplegia and hemiparesis following cerebral infarction affecting right dominant side: Secondary | ICD-10-CM | POA: Diagnosis not present

## 2023-06-27 DIAGNOSIS — I428 Other cardiomyopathies: Secondary | ICD-10-CM | POA: Diagnosis present

## 2023-06-27 DIAGNOSIS — J850 Gangrene and necrosis of lung: Secondary | ICD-10-CM | POA: Diagnosis present

## 2023-06-27 DIAGNOSIS — I38 Endocarditis, valve unspecified: Secondary | ICD-10-CM

## 2023-06-27 DIAGNOSIS — J159 Unspecified bacterial pneumonia: Secondary | ICD-10-CM | POA: Diagnosis present

## 2023-06-27 DIAGNOSIS — F1490 Cocaine use, unspecified, uncomplicated: Secondary | ICD-10-CM | POA: Diagnosis present

## 2023-06-27 DIAGNOSIS — I130 Hypertensive heart and chronic kidney disease with heart failure and stage 1 through stage 4 chronic kidney disease, or unspecified chronic kidney disease: Secondary | ICD-10-CM | POA: Diagnosis present

## 2023-06-27 DIAGNOSIS — Z713 Dietary counseling and surveillance: Secondary | ICD-10-CM | POA: Diagnosis not present

## 2023-06-27 DIAGNOSIS — Z5901 Sheltered homelessness: Secondary | ICD-10-CM | POA: Diagnosis not present

## 2023-06-27 DIAGNOSIS — Z7901 Long term (current) use of anticoagulants: Secondary | ICD-10-CM | POA: Diagnosis not present

## 2023-06-27 DIAGNOSIS — I5042 Chronic combined systolic (congestive) and diastolic (congestive) heart failure: Secondary | ICD-10-CM | POA: Diagnosis present

## 2023-06-27 DIAGNOSIS — Z7902 Long term (current) use of antithrombotics/antiplatelets: Secondary | ICD-10-CM | POA: Diagnosis not present

## 2023-06-27 DIAGNOSIS — N179 Acute kidney failure, unspecified: Secondary | ICD-10-CM | POA: Diagnosis present

## 2023-06-27 DIAGNOSIS — F1910 Other psychoactive substance abuse, uncomplicated: Secondary | ICD-10-CM | POA: Diagnosis present

## 2023-06-27 DIAGNOSIS — E785 Hyperlipidemia, unspecified: Secondary | ICD-10-CM | POA: Diagnosis present

## 2023-06-27 LAB — BASIC METABOLIC PANEL
Anion gap: 12 (ref 5–15)
BUN: 26 mg/dL — ABNORMAL HIGH (ref 6–20)
CO2: 22 mmol/L (ref 22–32)
Calcium: 9.2 mg/dL (ref 8.9–10.3)
Chloride: 102 mmol/L (ref 98–111)
Creatinine, Ser: 1.32 mg/dL — ABNORMAL HIGH (ref 0.61–1.24)
GFR, Estimated: >60 mL/min (ref >60)
Glucose, Bld: 101 mg/dL — ABNORMAL HIGH (ref 70–99)
Potassium: 4.5 mmol/L (ref 3.5–5.1)
Sodium: 136 mmol/L (ref 135–145)

## 2023-06-27 LAB — CBC
HCT: 35.8 % — ABNORMAL LOW (ref 39.0–52.0)
Hemoglobin: 11.6 g/dL — ABNORMAL LOW (ref 13.0–17.0)
MCH: 29.7 pg (ref 26.0–34.0)
MCHC: 32.4 g/dL (ref 30.0–36.0)
MCV: 91.6 fL (ref 80.0–100.0)
Platelets: 354 10*3/uL (ref 150–400)
RBC: 3.91 MIL/uL — ABNORMAL LOW (ref 4.22–5.81)
RDW: 15.6 % — ABNORMAL HIGH (ref 11.5–15.5)
WBC: 9 10*3/uL (ref 4.0–10.5)
nRBC: 0 % (ref 0.0–0.2)

## 2023-06-27 LAB — ECHOCARDIOGRAM COMPLETE
AR max vel: 2.16 cm2
AV Peak grad: 4.8 mm[Hg]
Ao pk vel: 1.1 m/s
Area-P 1/2: 4.29 cm2
Calc EF: 19.7 %
Est EF: <20
MV M vel: 5.04 m/s
MV Peak grad: 101.6 mm[Hg]
S' Lateral: 5.1 cm
Single Plane A2C EF: 16.3 %
Single Plane A4C EF: 22.1 %

## 2023-06-27 LAB — HIV ANTIBODY (ROUTINE TESTING W REFLEX): HIV Screen 4th Generation wRfx: NONREACTIVE

## 2023-06-27 LAB — C-REACTIVE PROTEIN: CRP: 1.5 mg/dL — ABNORMAL HIGH (ref <1.0)

## 2023-06-27 LAB — SEDIMENTATION RATE: Sed Rate: 38 mm/h — ABNORMAL HIGH (ref 0–16)

## 2023-06-27 LAB — TROPONIN I (HIGH SENSITIVITY): Troponin I (High Sensitivity): 21 ng/L — ABNORMAL HIGH (ref <18)

## 2023-06-27 LAB — CK: Total CK: 30 U/L — ABNORMAL LOW (ref 49–397)

## 2023-06-27 LAB — FERRITIN: Ferritin: 131 ng/mL (ref 24–336)

## 2023-06-27 MED ORDER — SENNOSIDES-DOCUSATE SODIUM 8.6-50 MG PO TABS: 1.0000 | ORAL_TABLET | Freq: Every evening | ORAL | Status: DC | PRN

## 2023-06-27 MED ORDER — ONDANSETRON HCL 4 MG/2ML IJ SOLN
4.0000 mg | Freq: Four times a day (QID) | INTRAMUSCULAR | Status: DC | PRN
  Administered 2023-06-28: 4 mg via INTRAVENOUS
  Filled 2023-06-27: qty 2

## 2023-06-27 MED ORDER — ACETAMINOPHEN 650 MG RE SUPP: 650.0000 mg | Freq: Four times a day (QID) | RECTAL | Status: DC | PRN

## 2023-06-27 MED ORDER — CARVEDILOL 3.125 MG PO TABS
3.1250 mg | ORAL_TABLET | Freq: Two times a day (BID) | ORAL | Status: DC
  Administered 2023-06-27 – 2023-07-04 (×15): 3.125 mg via ORAL
  Filled 2023-06-27 (×16): qty 1

## 2023-06-27 MED ORDER — CLOPIDOGREL BISULFATE 75 MG PO TABS
75.0000 mg | ORAL_TABLET | Freq: Every day | ORAL | Status: DC
  Administered 2023-06-27: 75 mg via ORAL
  Filled 2023-06-27: qty 1

## 2023-06-27 MED ORDER — SODIUM CHLORIDE 0.9 % IV SOLN
3.0000 g | Freq: Four times a day (QID) | INTRAVENOUS | Status: DC
  Administered 2023-06-27: 3 g via INTRAVENOUS
  Filled 2023-06-27 (×2): qty 8

## 2023-06-27 MED ORDER — ORAL CARE MOUTH RINSE: 15.0000 mL | OROMUCOSAL | Status: DC | PRN

## 2023-06-27 MED ORDER — ONDANSETRON HCL 4 MG PO TABS: 4.0000 mg | ORAL_TABLET | Freq: Four times a day (QID) | ORAL | Status: DC | PRN

## 2023-06-27 MED ORDER — TRAMADOL HCL 50 MG PO TABS: 100.0000 mg | ORAL_TABLET | Freq: Four times a day (QID) | ORAL | Status: DC | PRN

## 2023-06-27 MED ORDER — FUROSEMIDE 40 MG PO TABS
40.0000 mg | ORAL_TABLET | Freq: Every day | ORAL | Status: DC
  Administered 2023-06-27 – 2023-07-04 (×8): 40 mg via ORAL
  Filled 2023-06-27 (×9): qty 1

## 2023-06-27 MED ORDER — ALBUTEROL SULFATE (2.5 MG/3ML) 0.083% IN NEBU
2.5000 mg | INHALATION_SOLUTION | RESPIRATORY_TRACT | Status: DC | PRN
  Administered 2023-07-01: 2.5 mg via RESPIRATORY_TRACT
  Filled 2023-06-27: qty 3

## 2023-06-27 MED ORDER — ENOXAPARIN SODIUM 40 MG/0.4ML IJ SOSY
40.0000 mg | PREFILLED_SYRINGE | INTRAMUSCULAR | Status: DC
  Administered 2023-06-27: 40 mg via SUBCUTANEOUS
  Filled 2023-06-27: qty 0.4

## 2023-06-27 MED ORDER — UMECLIDINIUM BROMIDE 62.5 MCG/ACT IN AEPB
1.0000 | INHALATION_SPRAY | Freq: Every day | RESPIRATORY_TRACT | Status: DC
  Administered 2023-06-27 – 2023-07-02 (×6): 1 via RESPIRATORY_TRACT
  Filled 2023-06-27 (×2): qty 7

## 2023-06-27 MED ORDER — GUAIFENESIN ER 600 MG PO TB12
600.0000 mg | ORAL_TABLET | Freq: Two times a day (BID) | ORAL | Status: DC
Start: 2023-06-27 — End: 2023-07-04
  Administered 2023-06-27 – 2023-07-04 (×15): 600 mg via ORAL
  Filled 2023-06-27 (×16): qty 1

## 2023-06-27 MED ORDER — SODIUM CHLORIDE 0.9 % IV SOLN
3.0000 g | Freq: Four times a day (QID) | INTRAVENOUS | Status: DC
  Administered 2023-06-27 – 2023-07-04 (×26): 3 g via INTRAVENOUS
  Filled 2023-06-27 (×30): qty 8

## 2023-06-27 MED ORDER — NICOTINE 14 MG/24HR TD PT24
14.0000 mg | MEDICATED_PATCH | Freq: Every day | TRANSDERMAL | Status: AC
Start: 2023-06-27 — End: ?
  Administered 2023-06-27 – 2023-07-04 (×3): 14 mg via TRANSDERMAL
  Filled 2023-06-27 (×6): qty 1

## 2023-06-27 MED ORDER — SODIUM CHLORIDE 0.9% FLUSH
3.0000 mL | Freq: Two times a day (BID) | INTRAVENOUS | Status: DC
  Administered 2023-06-27 – 2023-07-03 (×13): 3 mL via INTRAVENOUS

## 2023-06-27 MED ORDER — LACTATED RINGERS IV BOLUS
250.0000 mL | Freq: Once | INTRAVENOUS | Status: AC
  Administered 2023-06-27: 250 mL via INTRAVENOUS

## 2023-06-27 MED ORDER — KETOROLAC TROMETHAMINE 30 MG/ML IJ SOLN
15.0000 mg | Freq: Four times a day (QID) | INTRAMUSCULAR | Status: AC
  Administered 2023-06-27 – 2023-07-02 (×20): 15 mg via INTRAVENOUS
  Filled 2023-06-27 (×20): qty 1

## 2023-06-27 MED ORDER — ACETAMINOPHEN 325 MG PO TABS: 650.0000 mg | ORAL_TABLET | Freq: Four times a day (QID) | ORAL | Status: DC | PRN

## 2023-06-27 MED ORDER — KETOROLAC TROMETHAMINE 30 MG/ML IJ SOLN: 30.0000 mg | Freq: Four times a day (QID) | INTRAMUSCULAR | Status: DC

## 2023-06-27 MED ORDER — ATORVASTATIN CALCIUM 40 MG PO TABS
40.0000 mg | ORAL_TABLET | Freq: Every day | ORAL | Status: DC
  Administered 2023-06-27 – 2023-07-04 (×8): 40 mg via ORAL
  Filled 2023-06-27 (×9): qty 1

## 2023-06-27 NOTE — ED Notes (Signed)
ED TO INPATIENT HANDOFF REPORT  Name/Age/Gender Craig Schmidt 52 y.o. male  Code Status    Code Status Orders  (From admission, onward)           Start     Ordered   06/27/23 0054  Full code  Continuous       Question:  By:  Answer:  Consent: discussion documented in EHR   06/27/23 0055           Code Status History     Date Active Date Inactive Code Status Order ID Comments User Context   06/19/2023 2315 06/22/2023 2017 Full Code [id number]  Steffanie Rainwater, MD ED   06/11/2023 0225 06/14/2023 0010 Full Code [id number]  Howerter, Chaney Born, DO ED   06/04/2023 0810 06/10/2023 1739 Full Code [id number]  Lorin Glass, MD ED   05/25/2023 1820 05/31/2023 2048 Full Code [id number]  Nolberto Hanlon, MD ED   11/28/2022 1620 12/05/2022 2107 Full Code [id number]  Jonah Blue, MD ED   04/13/2022 0842 04/13/2022 1705 Full Code [id number]  Laurey Morale, MD Inpatient   12/19/2021 1909 12/27/2021 2321 Full Code [id number]  Lyn Records, MD Inpatient   09/08/2021 0931 10/03/2021 2351 Full Code [id number]  Milon Dikes, MD ED   05/14/2015 1117 05/18/2015 1701 Full Code [id number]  Dorothea Ogle, MD ED       Home/SNF/Other Home  Chief Complaint Multifocal pneumonia [J18.9]  Level of Care/Admitting Diagnosis ED Disposition     ED Disposition  Admit   Condition  --   Comment  Hospital Area: [facility] [[id number]]  Level of Care: Telemetry [5]  Admit to tele based on following criteria: Other see comments  Comments: chf  May place patient in observation at [facility] or [facility] if equivalent level of care is available:: No  Covid Evaluation: Symptomatic Person Under Investigation (PUI) or recent exposure (last 10 days) *Testing Required*  Diagnosis: Multifocal pneumonia [3536144]  Admitting Physician: Charlsie Quest [[id number]]  Attending Physician: Charlsie Quest [phone]          Medical History Past Medical History:  Diagnosis Date   Alcohol  abuse    Asthma    Brain tumor (HCC)    CAD (coronary artery disease)    Chronic HFrEF (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)    Chronic kidney disease, stage 3 (HCC)    Cocaine abuse (HCC)    History of medication noncompliance    Homelessness    MI (myocardial infarction) (HCC)    STEMI (ST elevation myocardial infarction) (HCC)    Stroke (HCC)     Allergies Allergies  Allergen Reactions   Shellfish Allergy Anaphylaxis   Peanut (Diagnostic) Itching    IV Location/Drains/Wounds Patient Lines/Drains/Airways Status     Active Line/Drains/Airways     Name Placement date Placement time Site Days   Peripheral IV 06/26/23 20 G 1" Anterior;Right Antecubital 06/26/23  2302  Antecubital  1            Labs/Imaging Results for orders placed or performed during the hospital encounter of 06/26/23 (from the past 48 hour(s))  Troponin I (High Sensitivity)     Status: Abnormal   Collection Time: 06/26/23  8:53 PM  Result Value Ref Range   Troponin I (High Sensitivity) 18 (H) <18 ng/L    Comment: (NOTE) Elevated high sensitivity troponin I (hsTnI) values and significant  changes across serial measurements may suggest ACS but many other  chronic  and acute conditions are known to elevate hsTnI results.  Refer to the "Links" section for chest pain algorithms and additional  guidance. Performed at [facility], [address] [place], [place] [address]   Comprehensive metabolic panel     Status: Abnormal   Collection Time: 06/26/23  8:53 PM  Result Value Ref Range   Sodium 138 135 - 145 mmol/L   Potassium 4.7 3.5 - 5.1 mmol/L   Chloride 103 98 - 111 mmol/L   CO2 21 (L) 22 - 32 mmol/L   Glucose, Bld 91 70 - 99 mg/dL    Comment: Glucose reference range applies only to samples taken after fasting for at least 8 hours.   BUN 27 (H) 6 - 20 mg/dL   Creatinine, Ser 6.04 (H) 0.61 - 1.24 mg/dL   Calcium 9.4 8.9 - 54.0 mg/dL   Total Protein 8.1 6.5 -  8.1 g/dL   Albumin 3.3 (L) 3.5 - 5.0 g/dL   AST 29 15 - 41 U/L   ALT 11 0 - 44 U/L   Alkaline Phosphatase 143 (H) 38 - 126 U/L   Total Bilirubin 0.7 <1.2 mg/dL   GFR, Estimated >98 >11 mL/min    Comment: (NOTE) Calculated using the CKD-EPI Creatinine Equation (2021)    Anion gap 14 5 - 15    Comment: Performed at [facility], [address], [place], [place] [address]  CBC with Differential     Status: Abnormal   Collection Time: 06/26/23  8:53 PM  Result Value Ref Range   WBC 18.5 (H) 4.0 - 10.5 K/uL   RBC 3.95 (L) 4.22 - 5.81 MIL/uL   Hemoglobin 11.4 (L) 13.0 - 17.0 g/dL   HCT 29.5 (L) 62.1 - 30.8 %   MCV 92.7 80.0 - 100.0 fL   MCH 28.9 26.0 - 34.0 pg   MCHC 31.1 30.0 - 36.0 g/dL   RDW 65.7 84.6 - 96.2 %   Platelets 411 (H) 150 - 400 K/uL   nRBC 0.0 0.0 - 0.2 %   Neutrophils Relative % 64 %   Neutro Abs 11.9 (H) 1.7 - 7.7 K/uL   Lymphocytes Relative 15 %   Lymphs Abs 2.7 0.7 - 4.0 K/uL   Monocytes Relative 15 %   Monocytes Absolute 2.8 (H) 0.1 - 1.0 K/uL   Eosinophils Relative 5 %   Eosinophils Absolute 1.0 (H) 0.0 - 0.5 K/uL   Basophils Relative 0 %   Basophils Absolute 0.1 0.0 - 0.1 K/uL   Immature Granulocytes 1 %   Abs Immature Granulocytes 0.11 (H) 0.00 - 0.07 K/uL    Comment: Performed at [facility], [address], [place], [place] [address]  Brain natriuretic peptide     Status: Abnormal   Collection Time: 06/26/23  8:53 PM  Result Value Ref Range   B Natriuretic Peptide 711.9 (H) 0.0 - 100.0 pg/mL    Comment: Performed at [facility], [address], [place], [place] [address]  Troponin I (High Sensitivity)     Status: Abnormal   Collection Time: 06/26/23 11:03 PM  Result Value Ref Range   Troponin I (High Sensitivity) 21 (H) <18 ng/L    Comment: (NOTE) Elevated high sensitivity troponin I (hsTnI) values and significant  changes across serial measurements may suggest ACS but many other  chronic  and acute conditions are known to elevate hsTnI results.  Refer to the "Links" section for chest pain algorithms and additional  guidance. Performed at [facility], [address] [place], [place] [address]   Lactic acid, plasma     Status: Abnormal   Collection Time: 06/26/23 11:03 PM  Result Value Ref Range   Lactic Acid, Venous 2.1 (HH) 0.5 - 1.9 mmol/L    Comment: CRITICAL RESULT CALLED TO, READ BACK BY AND VERIFIED WITH C.SEVRIER, RN 06/26/23 2357 BY K .DAVIS Performed at [facility], [address], [place], [place] [address]    CT Angio Chest PE W/Cm &/Or Wo Cm  Result Date: 06/27/2023 CLINICAL DATA:  Shortness of breath. EXAM: CT ANGIOGRAPHY CHEST WITH CONTRAST TECHNIQUE: Multidetector CT imaging of the chest was performed using the standard protocol during bolus administration of intravenous contrast. Multiplanar CT image reconstructions and MIPs were obtained to evaluate the vascular anatomy. RADIATION DOSE REDUCTION: This exam was performed according to the departmental dose-optimization program which includes automated exposure control, adjustment of the mA and/or kV according to patient size and/or use of iterative reconstruction technique. CONTRAST:  80mL OMNIPAQUE IOHEXOL 350 MG/ML SOLN COMPARISON:  June 19, 2023 FINDINGS: Cardiovascular: The thoracic aorta is normal in appearance. Satisfactory opacification of the pulmonary arteries to the segmental level. No evidence of pulmonary embolism. There is stable moderate severity cardiomegaly. No pericardial effusion. Mediastinum/Nodes: No enlarged mediastinal, hilar, or axillary lymph nodes. Thyroid gland, trachea, and esophagus demonstrate no significant findings. Lungs/Pleura: A stable 8 mm noncalcified lung nodule is seen within the anterolateral aspect of the right upper lobe (axial CT image 52, CT series 12). Stable ground-glass appearing areas of lung parenchyma are seen bilaterally. Mild  lingular and mild anterolateral right middle lobe atelectasis and/or infiltrate is seen. Stable moderate severity areas of consolidation are noted within the anterolateral aspects of the bilateral lower lobes and posteromedial aspect of the right lower lobe. No pleural effusion or pneumothorax is identified. Upper Abdomen: No acute abnormality. Musculoskeletal: No chest wall abnormality. No acute or significant osseous findings. Review of the MIP images confirms the above findings. IMPRESSION: 1. No evidence of pulmonary embolism. 2. Stable 8 mm noncalcified right upper lobe lung nodule. Non-contrast chest CT at 6-12 months is recommended. If the nodule is stable at time of repeat CT, then future CT at 18-24 months (from today's scan) is considered optional for low-risk patients, but is recommended for high-risk patients. This recommendation follows the consensus statement: Guidelines for Management of Incidental Pulmonary Nodules Detected on CT Images: From the Fleischner Society 2017; Radiology 2017; 284:228-243. 3. Stable moderate severity areas of consolidation within the bilateral lower lobes and right middle lobe, likely infectious in origin. Follow-up to resolution is recommended to exclude the presence of an underlying neoplastic process. 4. Stable ground-glass appearing areas of lung parenchyma bilaterally which may represent sequelae associated with pulmonary edema or and inflammatory process such as pneumonitis. Electronically Signed   By: Aram Candela M.D.   On: 06/27/2023 00:14   DG Chest 2 View  Result Date: 06/26/2023 CLINICAL DATA:  Chest pain and shortness of breath. EXAM: CHEST - 2 VIEW COMPARISON:  June 19, 2023 FINDINGS: The cardiac silhouette is mildly enlarged and unchanged in size. Mild, predominant stable patchy infiltrates are seen within the bilateral lung bases. No pleural effusion or pneumothorax is identified. The visualized skeletal structures are unremarkable. IMPRESSION:  Mild bibasilar infiltrates. Electronically Signed   By: Aram Candela M.D.   On: 06/26/2023 21:22    Pending Labs Unresulted Labs (From admission, onward)     Start  Ordered   06/27/23 0500  CBC  Tomorrow morning,   R        06/27/23 0055   06/27/23 0500  Basic metabolic panel  Tomorrow morning,   R        06/27/23 0055   06/26/23 2222  Brain natriuretic peptide  Add-on,   AD        06/26/23 2221   06/26/23 2219  Lactic acid, plasma  (Lactic Acid)  Now then every 2 hours,   R (with STAT occurrences)      06/26/23 2218   06/26/23 2219  Blood culture (routine x 2)  BLOOD CULTURE X 2,   R (with STAT occurrences)      06/26/23 2218            Vitals/Pain Today's Vitals   06/26/23 2010 06/26/23 2210 06/26/23 2230 06/26/23 2338  BP: 128/80 (!) 125/92 125/88   Pulse: 96 95 95   Resp: 20 (!) 24 (!) 22   Temp: 97.9 F (36.6 C)   98.1 F (36.7 C)  TempSrc: Axillary   Oral  SpO2: 100% 99% 99%     Isolation Precautions No active isolations  Medications Medications  enoxaparin (LOVENOX) injection 40 mg (has no administration in time range)  sodium chloride flush (NS) 0.9 % injection 3 mL (3 mLs Intravenous Given 06/27/23 0148)  acetaminophen (TYLENOL) tablet 650 mg (has no administration in time range)    Or  acetaminophen (TYLENOL) suppository 650 mg (has no administration in time range)  ondansetron (ZOFRAN) tablet 4 mg (has no administration in time range)    Or  ondansetron (ZOFRAN) injection 4 mg (has no administration in time range)  senna-docusate (Senokot-S) tablet 1 tablet (has no administration in time range)  guaiFENesin (MUCINEX) 12 hr tablet 600 mg (600 mg Oral Given 06/27/23 0145)  nicotine (NICODERM CQ - dosed in mg/24 hours) patch 14 mg (14 mg Transdermal Patch Applied 06/27/23 0146)  Ampicillin-Sulbactam (UNASYN) 3 g in sodium chloride 0.9 % 100 mL IVPB (has no administration in time range)  atorvastatin (LIPITOR) tablet 40 mg (has no administration in  time range)  carvedilol (COREG) tablet 3.125 mg (has no administration in time range)  clopidogrel (PLAVIX) tablet 75 mg (has no administration in time range)  furosemide (LASIX) tablet 40 mg (has no administration in time range)  umeclidinium bromide (INCRUSE ELLIPTA) 62.5 MCG/ACT 1 puff (has no administration in time range)  albuterol (PROVENTIL) (2.5 MG/3ML) 0.083% nebulizer solution 2.5 mg (has no administration in time range)  ceFEPIme (MAXIPIME) 2 g in sodium chloride 0.9 % 100 mL IVPB (0 g Intravenous Stopped 06/27/23 0054)  vancomycin (VANCOREADY) IVPB 1250 mg/250 mL (1,250 mg Intravenous New Bag/Given 06/27/23 0021)  iohexol (OMNIPAQUE) 350 MG/ML injection 80 mL (80 mLs Intravenous Contrast Given 06/26/23 2327)  lactated ringers bolus 250 mL (0 mLs Intravenous Stopped 06/27/23 0054)    Mobility walks with device

## 2023-06-27 NOTE — Telephone Encounter (Addendum)
Pt states he is currently at [facility] for pneumonia & bronchitis. Pt is told he is going to be kept for about 4-5 days. Pt scheduled to do biopsy tomorrow at [facility]. He believes he should be out by then to make it to appointment.  Pt advised by physician at hospital to follow up with PCP about his vision as well. Pt inquiring as well about scheduling that appointment.   Pt states his pick up location is at the [facility], [address], [place], [place] [address].   Best contact number: [phone]

## 2023-06-27 NOTE — Consult Note (Signed)
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  He has been to the hospital 5 times for this issue since 05/25/23.  No TB exposures that he knows of.  No sick contacts. Comorbidities include ischemic cardiomyopathy, severe mitral valve issues (see TEE 2023), CKD3a, Prior CVA's with residual RUE weakness No aspiration symptoms ROS as below  Pertinent  Medical History  See above  Significant Hospital Events: Including procedures, antibiotic start and stop dates in addition to other pertinent events   06/27/23  Interim History / Subjective:  Consult  Objective   Blood pressure (!) 130/93, pulse 99, temperature 97.7 F (36.5 C), temperature source Oral, resp. rate 19, SpO2 100%.        Intake/Output Summary (Last 24 hours) at 06/27/2023 0912 Last data filed at 06/27/2023 0800 Gross per 24 hour  Intake 480 ml  Output 125 ml  Net 355 ml   There were no vitals filed for this visit.  Examination: General: chronically ill appearing man in NAD HENT: MM dry, poor dentition Lungs: maybe some crackles, no accessory muscle use Cardiovascular: RRR, has a murmur Abdomen: soft Extremities: no edema, mild muscle wasting Neuro: moves to command except weaker on R esp RUE Skin: has depigmenting maculopapular rash  WBC up CT personally reviewed: persistent peripheral necrotic mass-like consolidations, mediastinal adenopathy  Weight review: 71kg in May, currently [facility] Problem list   N/A  Assessment & Plan:  Nonresolving pneumonia symptoms (pleurisy, cough, chills) in smoker with severe valvular and ischemic cardiomyopathy with a background  of B symptoms (weight loss, night sweats).  Concern here is for IE given distribution (bronchovascular/peripheral/necrotic) vs cancer.   Pretty low suspicion for TB given imaging.  Denies IVDA.  +crack/smoking hx.  Noted he is on DAPT for hx of ischemic caridomyopathy and stroke.  - Recheck HIV (last in May) - Rheum panel, Quantiferon - Low dose toradol for pleurisy - TTE, low threshold for TEE - Unasyn fine for now - Hold plavix - NPO MN, bronch/BAL for fungus/culture/cyto/AFB; if this does not give diagnosis would do plavix washout and EBUS +/- ENB guided FNA of these mass like lesions for culture/cyto; patient's complex social situation makes doing this as OP difficult - Will follow, tough case  Best Practice (right click and "Reselect all SmartList Selections" daily)   Per primary  Labs   CBC: Recent Labs  Lab 06/22/23 0603 06/26/23 2053  WBC 8.4 18.5*  NEUTROABS 5.8 11.9*  HGB 11.0* 11.4*  HCT 35.0* 36.6*  MCV 91.6 92.7  PLT 378 411*    Basic Metabolic Panel: Recent Labs  Lab 06/22/23 0603 06/26/23 2053  NA 136 138  K 4.6 4.7  CL 100 103  CO2 25 21*  GLUCOSE 109* 91  BUN 29* 27*  CREATININE 1.49* 1.29*  CALCIUM 8.9 9.4  MG 2.2  --   PHOS 4.5  --    GFR: Estimated Creatinine Clearance: 60.4 mL/min (A) (by C-G formula based on SCr of 1.29 mg/dL (H)). Recent Labs  Lab 06/22/23 0603 06/26/23 2053 06/26/23 2303  WBC 8.4 18.5*  --   LATICACIDVEN  --   --  2.1*  Liver Function Tests: Recent Labs  Lab 06/22/23 0603 06/26/23 2053  AST 25 29  ALT 11 11  ALKPHOS 113 143*  BILITOT 0.5 0.7  PROT 7.3 8.1  ALBUMIN 2.8* 3.3*   No results for input(s): "LIPASE", "AMYLASE" in the last 168 hours. No results for input(s): "AMMONIA" in the last 168 hours.  ABG    Component Value Date/Time   PHART 7.54 (H) 12/21/2021 1550   PCO2ART 37 12/21/2021 1550   PO2ART 84 12/21/2021 1550   HCO3 22.2 04/13/2022 0812   HCO3 21.4 04/13/2022 0812   TCO2 23 04/13/2022  0812   TCO2 23 04/13/2022 0812   ACIDBASEDEF 3.0 (H) 04/13/2022 0812   ACIDBASEDEF 3.0 (H) 04/13/2022 0812   O2SAT 76 04/13/2022 0812   O2SAT 78 04/13/2022 0812     Coagulation Profile: No results for input(s): "INR", "PROTIME" in the last 168 hours.  Cardiac Enzymes: No results for input(s): "CKTOTAL", "CKMB", "CKMBINDEX", "TROPONINI" in the last 168 hours.  HbA1C: Hgb A1c MFr Bld  Date/Time Value Ref Range Status  05/27/2023 06:29 AM 5.6 4.8 - 5.6 % Final    Comment:    (NOTE) Pre diabetes:          5.7%-6.4%  Diabetes:              >6.4%  Glycemic control for   <7.0% adults with diabetes   11/28/2022 05:43 PM 5.4 4.8 - 5.6 % Final    Comment:    (NOTE) Pre diabetes:          5.7%-6.4%  Diabetes:              >6.4%  Glycemic control for   <7.0% adults with diabetes     CBG: No results for input(s): "GLUCAP" in the last 168 hours.  Review of Systems:    Positive Symptoms in bold:  Constitutional fevers, chills, weight loss, fatigue, anorexia, malaise  Eyes decreased vision, double vision, eye irritation  Ears, Nose, Mouth, Throat sore throat, trouble swallowing, sinus congestion  Cardiovascular chest pain, paroxysmal nocturnal dyspnea, lower ext edema, palpitations   Respiratory SOB, cough, DOE, hemoptysis, wheezing  Gastrointestinal nausea, vomiting, diarrhea  Genitourinary burning with urination, trouble urinating  Musculoskeletal joint aches, joint swelling, back pain  Integumentary  rashes, skin lesions  Neurological focal weakness, focal numbness, trouble speaking, headaches  Psychiatric depression, anxiety, confusion  Endocrine polyuria, polydipsia, cold intolerance, heat intolerance  Hematologic abnormal bruising, abnormal bleeding, unexplained nose bleeds  Allergic/Immunologic recurrent infections, hives, swollen lymph nodes     Past Medical History:  He,  has a past medical history of Alcohol abuse, Asthma, Brain tumor (HCC), CAD (coronary  artery disease), Chronic HFrEF (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), Chronic kidney disease, stage 3 (HCC), Cocaine abuse (HCC), History of medication noncompliance, Homelessness, MI (myocardial infarction) (HCC), STEMI (ST elevation myocardial infarction) (HCC), and Stroke (HCC).   Surgical History:   Past Surgical History:  Procedure Laterality Date   BUBBLE STUDY  09/11/2021   Procedure: BUBBLE STUDY;  Surgeon: Pricilla Riffle, MD;  Location: [facility] ENDOSCOPY;  Service: Cardiovascular;;   LEFT HEART CATH AND CORONARY ANGIOGRAPHY N/A 12/19/2021   Procedure: LEFT HEART CATH AND CORONARY ANGIOGRAPHY;  Surgeon: Lyn Records, MD;  Location: [facility] INVASIVE CV LAB;  Service: Cardiovascular;  Laterality: N/A;   RIGHT/LEFT HEART CATH AND CORONARY ANGIOGRAPHY N/A 04/13/2022   Procedure: RIGHT/LEFT HEART CATH AND CORONARY ANGIOGRAPHY;  Surgeon: Laurey Morale, MD;  Location: [facility] INVASIVE CV LAB;  Service: Cardiovascular;  Laterality: N/A;   TEE WITHOUT CARDIOVERSION N/A 09/11/2021   Procedure: TRANSESOPHAGEAL ECHOCARDIOGRAM (TEE);  Surgeon: Pricilla Riffle, MD;  Location: [facility] ENDOSCOPY;  Service: Cardiovascular;  Laterality: N/A;     Social History:   reports that he has been smoking cigarettes. He has a 41 pack-year smoking history. He has been exposed to tobacco smoke. He has never used smokeless tobacco. He reports current alcohol use of about 63.0 standard drinks of alcohol per week. He reports current drug use. Drugs: "Crack" cocaine and Cocaine.   Family History:  His family history includes Diabetes in his mother.   Allergies Allergies  Allergen Reactions   Shellfish Allergy Anaphylaxis   Peanut (Diagnostic) Itching     Home Medications  Prior to Admission medications   Medication Sig Start Date End Date Taking? Authorizing Provider  amoxicillin-clavulanate (AUGMENTIN) 875-125 MG tablet Take 1 tablet by mouth every 12 (twelve) hours for 28 days. 06/22/23 07/20/23 Yes Danford,  Earl Lites, MD  atorvastatin (LIPITOR) 40 MG tablet Take 1 tablet (40 mg total) by mouth daily. 06/10/23  Yes Marinda Elk, MD  carvedilol (COREG) 3.125 MG tablet Take 1 tablet (3.125 mg total) by mouth 2 (two) times daily. 06/10/23  Yes Marinda Elk, MD  clopidogrel (PLAVIX) 75 MG tablet Take 1 tablet (75 mg total) by mouth daily. 06/10/23  Yes Marinda Elk, MD  furosemide (LASIX) 40 MG tablet Take 1 tablet (40 mg total) by mouth daily. Patient taking differently: Take 80 mg by mouth daily. 06/22/23 07/22/23 Yes Danford, Earl Lites, MD  umeclidinium bromide (INCRUSE ELLIPTA) 62.5 MCG/ACT AEPB Inhale 1 puff into the lungs daily. 06/23/23 07/23/23 Yes Danford, Earl Lites, MD  apixaban (ELIQUIS) 5 MG TABS tablet Take 1 tablet (5 mg total) by mouth 2 (two) times daily. 06/22/23 06/22/23  DanfordEarl Lites, MD     Critical care time: N/A

## 2023-06-27 NOTE — H&P (Signed)
History and Physical    Craig Schmidt:[id number] DOB: Sep 20, 1970 DOA: 06/26/2023  PCP: Schmidt Fendt, NP  Patient coming from: Homeless  I have personally briefly reviewed patient's old medical records in [facility]  Chief Complaint: Cough, shortness of breath  HPI: Craig Schmidt is a 52 y.o. 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  Patient with multiple recent hospital visits for multifocal pulmonary infiltrates/pneumonia: 11/2 admitted for multifocal pneumonia and left AMA on 11/8 11/12 admitted for sepsis secondary to multifocal pneumonia and acute hypoxic respiratory failure.  Discharged 11/18. 11/19 admitted for persistent multifocal pneumonia and discharged on 11/21. 11/24 presented to ED for right flank pain and discharged home from ED.  Most recently admitted 11/27 with persistent bibasilar infiltrates.  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  Patient initially treated with vancomycin and cefepime.  Pulmonology consulted and antibiotics were changed to Unasyn with clinical improvement.  He was transitioned to oral Augmentin to be continued for minimum of 28 days and recommended follow-up with pulmonology in 3 weeks for repeat CT scan.  Patient was discharged on 11/30.  Patient reports with persistent and worsening shortness of breath and nonproductive cough.  He has been feeling fatigued.  He has had diaphoresis.  He states that he has all of his prescribed medications but does admit that sometimes he misses a dose of the meds on occasion.  He is homeless and has been having difficulty finding a living situation.  He does report smoking crack  cocaine, last use was about 2 days ago.  He says he smokes less than a half a pack of cigarettes daily.  He reports occasional alcohol use but no daily or binge drinking.  ED Course  Labs/Imaging on admission: I have personally reviewed following labs and imaging studies.  Initial vitals showed BP 128/80, pulse 96, RR 20, temp 97.9 F, SpO2 100% on room air.  Labs show WBC 18.5, hemoglobin 11.4, platelets 411,000, sodium 138, potassium 4.7, bicarb 21, BUN 27, creatinine 1.29, serum glucose 91, lactic acid 2.1, BNP 711.9, troponin 18 > 21.  Blood cultures ordered, 1 set obtained, second set pending collection.  2 view chest x-ray showed mild bibasilar infiltrates.  CTA chest negative for evidence of PE.  Stable 8 mm noncalcified RUL lung nodule seen.  Stable moderate severity areas of consolidation within the bilateral lower lobes and right middle lobe also seen.  Stable groundglass appearing areas of lung parenchyma bilaterally which may represent sequela associated with pulmonary edema or and inflammatory process such as pneumonitis.  Patient was given IV vancomycin and cefepime, 250 mL LR bolus.  The hospitalist service was consulted to admit for further evaluation and management.  Review of Systems: All systems reviewed and are negative except as documented in history of present illness above.   Past Medical History:  Diagnosis Date   Alcohol abuse    Asthma    Brain tumor (HCC)    CAD (coronary artery disease)    Chronic HFrEF (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)    Chronic kidney disease, stage 3 (HCC)    Cocaine abuse (HCC)    History of medication noncompliance    Homelessness    MI (myocardial infarction) (  HCC)    STEMI (ST elevation myocardial infarction) (HCC)    Stroke [facility])     Past Surgical History:  Procedure Laterality Date   BUBBLE STUDY  09/11/2021   Procedure: BUBBLE STUDY;  Surgeon: Pricilla Riffle, MD;  Location: [facility] ENDOSCOPY;  Service:  Cardiovascular;;   LEFT HEART CATH AND CORONARY ANGIOGRAPHY N/A 12/19/2021   Procedure: LEFT HEART CATH AND CORONARY ANGIOGRAPHY;  Surgeon: Lyn Records, MD;  Location: [facility] INVASIVE CV LAB;  Service: Cardiovascular;  Laterality: N/A;   RIGHT/LEFT HEART CATH AND CORONARY ANGIOGRAPHY N/A 04/13/2022   Procedure: RIGHT/LEFT HEART CATH AND CORONARY ANGIOGRAPHY;  Surgeon: Laurey Morale, MD;  Location: [facility] INVASIVE CV LAB;  Service: Cardiovascular;  Laterality: N/A;   TEE WITHOUT CARDIOVERSION N/A 09/11/2021   Procedure: TRANSESOPHAGEAL ECHOCARDIOGRAM (TEE);  Surgeon: Pricilla Riffle, MD;  Location: [facility] ENDOSCOPY;  Service: Cardiovascular;  Laterality: N/A;    Social History:  reports that he has been smoking cigarettes. He has a 41 pack-year smoking history. He has been exposed to tobacco smoke. He has never used smokeless tobacco. He reports current alcohol use of about 63.0 standard drinks of alcohol per week. He reports current drug use. Drugs: "Crack" cocaine and Cocaine.  Allergies  Allergen Reactions   Shellfish Allergy Anaphylaxis   Peanut (Diagnostic) Itching    Family History  Problem Relation Age of Onset   Diabetes Mother      Prior to Admission medications   Medication Sig Start Date End Date Taking? Authorizing Provider  amoxicillin-clavulanate (AUGMENTIN) 875-125 MG tablet Take 1 tablet by mouth every 12 (twelve) hours for 28 days. 06/22/23 07/20/23  DanfordEarl Lites, MD  atorvastatin (LIPITOR) 40 MG tablet Take 1 tablet (40 mg total) by mouth daily. 06/10/23   Marinda Elk, MD  carvedilol (COREG) 3.125 MG tablet Take 1 tablet (3.125 mg total) by mouth 2 (two) times daily. 06/10/23   Marinda Elk, MD  clopidogrel (PLAVIX) 75 MG tablet Take 1 tablet (75 mg total) by mouth daily. 06/10/23   Marinda Elk, MD  furosemide (LASIX) 40 MG tablet Take 1 tablet (40 mg total) by mouth daily. 06/22/23 07/22/23  Danford, Earl Lites, MD  umeclidinium bromide  (INCRUSE ELLIPTA) 62.5 MCG/ACT AEPB Inhale 1 puff into the lungs daily. 06/23/23 07/23/23  Danford, Earl Lites, MD  apixaban (ELIQUIS) 5 MG TABS tablet Take 1 tablet (5 mg total) by mouth 2 (two) times daily. 06/22/23 06/22/23  Alberteen Sam, MD    Physical Exam: Vitals:   06/26/23 2010 06/26/23 2210 06/26/23 2230 06/26/23 2338  BP: 128/80 (!) 125/92 125/88   Pulse: 96 95 95   Resp: 20 (!) 24 (!) 22   Temp: 97.9 F (36.6 C)   98.1 F (36.7 C)  TempSrc: Axillary   Oral  SpO2: 100% 99% 99%    Constitutional: Resting in bed, NAD, calm, comfortable Eyes: EOMI, lids and conjunctivae normal ENMT: Mucous membranes are moist. Posterior pharynx clear of any exudate or lesions.poor dentition.  Neck: normal, supple, no masses. Respiratory: Bibasilar respiratory crackles, faint expiratory wheezing. Normal respiratory effort. No accessory muscle use.  Cardiovascular: Regular rate and rhythm, no murmurs / rubs / gallops. No extremity edema. 2+ pedal pulses. Abdomen: no tenderness, no masses palpated.  Musculoskeletal: no clubbing / cyanosis. No joint deformity upper and lower extremities. Good ROM, no contractures. Normal muscle tone.  Skin: no rashes, lesions, ulcers. No induration Neurologic: Sensation intact. Strength 5/5 in all 4.  Psychiatric:  Alert  and oriented x 3. Normal mood.   EKG: Personally reviewed. Sinus rhythm, rate 93, incomplete LBBB.  Motion artifact present.  Not significantly changed when compared to previous.  Assessment/Plan Principal Problem:   Multifocal pneumonia Active Problems:   Chronic combined systolic and diastolic CHF (congestive heart failure) (HCC)   Coronary artery disease   Essential hypertension   Hyperlipidemia   History of CVA (cerebrovascular accident)   Substance use disorder   NARAYAN WAKELAND is a 52 y.o. male with medical history significant for chronic combined systolic and diastolic CHF (EF <13%), CAD, history of CVA, asthma, HTN,  HLD, substance use disorder (crack cocaine, EtOH), homelessness, and multiple recent admissions for multifocal lung infiltrates who is admitted with multifocal pneumonia.  Assessment and Plan: Persistent multifocal lung infiltrates: CTA chest again shows stable moderate severity areas of consolidation within bilateral lower lobes and right middle lobe.  No evidence of PE.  WBC elevated 18.5. -Continue IV Unasyn -Follow blood cultures -Supplemental O2 as needed -Incentive spirometer, flutter valve, Mucinex -SLP eval for aspiration assessment  Chronic combined systolic and diastolic CHF: TTE 05/27/2023 showed EF <20%.  Appears euvolemic on admission. -Continue home Coreg and Lasix -Monitor I/O's, daily weights  Coronary artery disease: Stable.  Continue Plavix and atorvastatin.  History of CVA: Continue Plavix and atorvastatin.  Hypertension: Continue Coreg.  Asthma: Continue Incruse Ellipta, albuterol as needed.  Hyperlipidemia: Continue atorvastatin.  Substance use disorder: Patient reports ongoing crack cocaine use.  He reports smoking less than 1 pack of cigarettes daily.  Nicotine patch provided.   DVT prophylaxis: enoxaparin (LOVENOX) injection 40 mg Start: 06/27/23 1000 Code Status: Full code, confirmed with patient on admission Family Communication: Significant other at bedside Disposition Plan: Pending clinical progress Consults called: None Severity of Illness: The appropriate patient status for this patient is OBSERVATION. Observation status is judged to be reasonable and necessary in order to provide the required intensity of service to ensure the patient's safety. The patient's presenting symptoms, physical exam findings, and initial radiographic and laboratory data in the context of their medical condition is felt to place them at decreased risk for further clinical deterioration. Furthermore, it is anticipated that the patient will be medically stable for discharge  from the hospital within 2 midnights of admission.   Darreld Mclean MD [facility]  If 7PM-7AM, please contact night-coverage [email or web address]  06/27/2023, 1:00 AM

## 2023-06-27 NOTE — Plan of Care (Signed)

## 2023-06-27 NOTE — Evaluation (Signed)
Clinical/Bedside Swallow Evaluation Patient Details  Name: Craig Schmidt MRN: [id number] Date of Birth: Nov 28, 1970  Today's Date: 06/27/2023 Time: SLP Start Time (ACUTE ONLY): 1337 SLP Stop Time (ACUTE ONLY): 1355 SLP Time Calculation (min) (ACUTE ONLY): 18 min  Past Medical History:  Past Medical History:  Diagnosis Date   Alcohol abuse    Asthma    Brain tumor (HCC)    CAD (coronary artery disease)    Chronic HFrEF (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)    Chronic kidney disease, stage 3 (HCC)    Cocaine abuse (HCC)    History of medication noncompliance    Homelessness    MI (myocardial infarction) (HCC)    STEMI (ST elevation myocardial infarction) (HCC)    Stroke [facility])    Past Surgical History:  Past Surgical History:  Procedure Laterality Date   BUBBLE STUDY  09/11/2021   Procedure: BUBBLE STUDY;  Surgeon: Pricilla Riffle, MD;  Location: [facility] ENDOSCOPY;  Service: Cardiovascular;;   LEFT HEART CATH AND CORONARY ANGIOGRAPHY N/A 12/19/2021   Procedure: LEFT HEART CATH AND CORONARY ANGIOGRAPHY;  Surgeon: Lyn Records, MD;  Location: [facility] INVASIVE CV LAB;  Service: Cardiovascular;  Laterality: N/A;   RIGHT/LEFT HEART CATH AND CORONARY ANGIOGRAPHY N/A 04/13/2022   Procedure: RIGHT/LEFT HEART CATH AND CORONARY ANGIOGRAPHY;  Surgeon: Laurey Morale, MD;  Location: [facility] INVASIVE CV LAB;  Service: Cardiovascular;  Laterality: N/A;   TEE WITHOUT CARDIOVERSION N/A 09/11/2021   Procedure: TRANSESOPHAGEAL ECHOCARDIOGRAM (TEE);  Surgeon: Pricilla Riffle, MD;  Location: [facility] ENDOSCOPY;  Service: Cardiovascular;  Laterality: N/A;   HPI:  52 year old homeless man with addiction to cigarettes and crack cocaine presenting with >1 month dry cough, chills, pleurisy with background of 6 months unintentional weight loss.  Per chart pt has been to the hospital 5 times for this issue since 05/25/23. CXR Mild bibasilar infiltrates. PMH: stroke 2023, CAD, CKD, brain tumor, ETOH abuse. BSE 11/30/22  no signs of aspiration in reclined position due to pain, denied dysphagia and unlikely to suffer from silent aspiration and denied reflux or regurgitation. Recommended to continue regular diet, thin liquids.    Assessment / Plan / Recommendation  Clinical Impression  Pt denies symptoms of dysphagia and did not exhibit indications of aspiration at bedside. He did state when he coughs he brings mucous up in his throat and he uses food (ice cream) to "get it back down." Majority of dentition is intact but missing 1-2 upper. He demonstrated a strong cough and continuous sips thin water, applesauce and cracker consumed without s/s aspiration. Pt has been admitted with pneumonia multiple times over the past year, has history of stroke and question of possible pharyngeal retention with mucous. An MBS is warranted to assess for silent aspiration and is scheduled for this afternoon. He may continue with regular texture, thin liquids until MBS. SLP Visit Diagnosis: Dysphagia, unspecified (R13.10)    Aspiration Risk  Mild aspiration risk    Diet Recommendation Regular;Thin liquid    Liquid Administration via: Straw Medication Administration: Whole meds with liquid Supervision: Staff to assist with self feeding Compensations: Slow rate;Small sips/bites Postural Changes: Seated upright at 90 degrees    Other  Recommendations Oral Care Recommendations: Oral care BID    Recommendations for follow up therapy are one component of a multi-disciplinary discharge planning process, led by the attending physician.  Recommendations may be updated based on patient status, additional functional criteria and insurance authorization.  Follow up Recommendations  (  TBD)      Assistance Recommended at Discharge    Functional Status Assessment    Frequency and Duration            Prognosis        Swallow Study   General Date of Onset: 06/27/23 HPI: 52 year old homeless man with addiction to cigarettes and  crack cocaine presenting with >1 month dry cough, chills, pleurisy with background of 6 months unintentional weight loss.  Per chart pt has been to the hospital 5 times for this issue since 05/25/23. CXR Mild bibasilar infiltrates. PMH: stroke 2023, CAD, CKD, brain tumor, ETOH abuse. BSE 11/30/22 no signs of aspiration in reclined position due to pain, denied dysphagia and unlikely to suffer from silent aspiration and denied reflux or regurgitation. Recommended to continue regular diet, thin liquids. Type of Study: Bedside Swallow Evaluation Previous Swallow Assessment:  (see HPI) Diet Prior to this Study: Regular;Thin liquids (Level 0) Temperature Spikes Noted: No Respiratory Status: Room air History of Recent Intubation: No Behavior/Cognition: Alert;Cooperative;Pleasant mood Oral Cavity Assessment: Within Functional Limits Oral Care Completed by SLP: No Oral Cavity - Dentition: Other (Comment) (missing 1-2 upper but majority intact) Vision: Functional for self-feeding Self-Feeding Abilities: Needs assist    Oral/Motor/Sensory Function Overall Oral Motor/Sensory Function: Within functional limits   Ice Chips Ice chips: Not tested   Thin Liquid Thin Liquid: Within functional limits Presentation: Straw    Nectar Thick Nectar Thick Liquid: Not tested   Honey Thick Honey Thick Liquid: Not tested   Puree Puree: Within functional limits   Solid     Solid: Within functional limits      Royce Macadamia 06/27/2023,2:20 PM

## 2023-06-27 NOTE — Hospital Course (Signed)
Craig Schmidt is a 52 y.o. male with medical history significant for chronic combined systolic and diastolic CHF (EF <16%), CAD, history of CVA, asthma, HTN, HLD, substance use disorder (crack cocaine, EtOH), homelessness, and multiple recent admissions for multifocal lung infiltrates who is admitted with multifocal pneumonia.

## 2023-06-27 NOTE — Telephone Encounter (Signed)
Called patient to him know we need location to where he is so we can schedule pick up left voicemail to call office back

## 2023-06-27 NOTE — Progress Notes (Signed)
Echocardiogram 2D Echocardiogram has been performed.  Lucendia Herrlich 06/27/2023, 11:18 AM

## 2023-06-27 NOTE — Progress Notes (Signed)
[facility] PROGRESS NOTE    Progress Note  Craig Schmidt  ZOX:[id number] DOB: 03/10/1971 DOA: 06/26/2023 PCP: Rema Fendt, NP     Brief Narrative:   Craig Schmidt is an 52 y.o. 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   Assessment/Plan:   Persistent multifocal lung infiltrates: CT angio of the chest showed no PE but showed bilateral moderate consolidation of bilateral lower lobes and middle lobe. Started on IV Unasyn on admission, blood cultures have been sent. Will and consult pulmonary for possible bronchoscopy, he has remained afebrile but has rising white blood cell count.  Chronic combined systolic and diastolic CHF (congestive heart failure): Last EF of 20% appears euvolemic on admission. Continue Coreg and Lasix monitor strict I's and O's and daily weights restrict fluids.  Coronary artery disease Asymptomatic continue Plavix and statins.  History of CVA: Continue Plavix and statins.  Essential hypertension: Continue Coreg and Lasix.  Asthma: Continue inhalers.  Hyperlipidemia: Continue statins.  Polysubstance abuse: Patient reports ongoing crack use.  DVT prophylaxis: lovenox Family Communication:none Status is: Observation The patient will require care spanning > 2 midnights and should be moved to inpatient because: Multifocal pneumonia    Code Status:     Code Status Orders  (From admission, onward)           Start     Ordered   06/27/23 0054  Full code  Continuous       Question:  By:  Answer:  Consent: discussion documented in EHR   06/27/23  0055           Code Status History     Date Active Date Inactive Code Status Order ID Comments User Context   06/19/2023 2315 06/22/2023 2017 Full Code [id number]  Steffanie Rainwater, MD ED   06/11/2023 0225 06/14/2023 0010 Full Code [id number]  Howerter, Chaney Born, DO ED   06/04/2023 0810 06/10/2023 1739 Full Code [id number]  Lorin Glass, MD ED   05/25/2023 1820 05/31/2023 2048 Full Code [id number]  Nolberto Hanlon, MD ED   11/28/2022 1620 12/05/2022 2107 Full Code [id number]  Jonah Blue, MD ED   04/13/2022 0842 04/13/2022 1705 Full Code [id number]  Laurey Morale, MD Inpatient   12/19/2021 1909 12/27/2021 2321 Full Code [id number]  Lyn Records, MD Inpatient   09/08/2021 0931 10/03/2021 2351 Full Code [id number]  Milon Dikes, MD ED   05/14/2015 1117 05/18/2015 1701 Full Code [id number]  Dorothea Ogle, MD ED         IV Access:   Peripheral IV   Procedures and diagnostic studies:   CT Angio Chest PE W/Cm &/Or Wo Cm  Result Date: 06/27/2023 CLINICAL DATA:  Shortness of breath. EXAM: CT ANGIOGRAPHY CHEST WITH CONTRAST TECHNIQUE: Multidetector CT imaging of the chest was performed using the standard protocol during bolus administration of intravenous contrast. Multiplanar CT image reconstructions and MIPs were obtained to evaluate the vascular anatomy. RADIATION DOSE REDUCTION: This exam was performed according to the departmental dose-optimization program which includes automated exposure control, adjustment of the mA and/or kV according to patient size and/or use of iterative  reconstruction technique. CONTRAST:  80mL OMNIPAQUE IOHEXOL 350 MG/ML SOLN COMPARISON:  June 19, 2023 FINDINGS: Cardiovascular: The thoracic aorta is normal in appearance. Satisfactory opacification of the pulmonary arteries to the segmental level. No evidence of pulmonary embolism. There is stable moderate severity cardiomegaly. No pericardial effusion. Mediastinum/Nodes: No enlarged mediastinal, hilar, or axillary  lymph nodes. Thyroid gland, trachea, and esophagus demonstrate no significant findings. Lungs/Pleura: A stable 8 mm noncalcified lung nodule is seen within the anterolateral aspect of the right upper lobe (axial CT image 52, CT series 12). Stable ground-glass appearing areas of lung parenchyma are seen bilaterally. Mild lingular and mild anterolateral right middle lobe atelectasis and/or infiltrate is seen. Stable moderate severity areas of consolidation are noted within the anterolateral aspects of the bilateral lower lobes and posteromedial aspect of the right lower lobe. No pleural effusion or pneumothorax is identified. Upper Abdomen: No acute abnormality. Musculoskeletal: No chest wall abnormality. No acute or significant osseous findings. Review of the MIP images confirms the above findings. IMPRESSION: 1. No evidence of pulmonary embolism. 2. Stable 8 mm noncalcified right upper lobe lung nodule. Non-contrast chest CT at 6-12 months is recommended. If the nodule is stable at time of repeat CT, then future CT at 18-24 months (from today's scan) is considered optional for low-risk patients, but is recommended for high-risk patients. This recommendation follows the consensus statement: Guidelines for Management of Incidental Pulmonary Nodules Detected on CT Images: From the Fleischner Society 2017; Radiology 2017; 284:228-243. 3. Stable moderate severity areas of consolidation within the bilateral lower lobes and right middle lobe, likely infectious in origin. Follow-up to resolution is recommended to exclude the presence of an underlying neoplastic process. 4. Stable ground-glass appearing areas of lung parenchyma bilaterally which may represent sequelae associated with pulmonary edema or and inflammatory process such as pneumonitis. Electronically Signed   By: Aram Candela M.D.   On: 06/27/2023 00:14   DG Chest 2 View  Result Date: 06/26/2023 CLINICAL DATA:  Chest pain and shortness of breath. EXAM:  CHEST - 2 VIEW COMPARISON:  June 19, 2023 FINDINGS: The cardiac silhouette is mildly enlarged and unchanged in size. Mild, predominant stable patchy infiltrates are seen within the bilateral lung bases. No pleural effusion or pneumothorax is identified. The visualized skeletal structures are unremarkable. IMPRESSION: Mild bibasilar infiltrates. Electronically Signed   By: Aram Candela M.D.   On: 06/26/2023 21:22     Medical Consultants:   None.   Subjective:    Craig Schmidt relates pleuritic chest pain  Objective:    Vitals:   06/26/23 2230 06/26/23 2338 06/27/23 0242 06/27/23 0742  BP: 125/88  121/88 (!) 130/93  Pulse: 95  (!) 104 99  Resp: (!) 22  18 19   Temp:  98.1 F (36.7 C) 98.2 F (36.8 C) 97.7 F (36.5 C)  TempSrc:  Oral Oral Oral  SpO2: 99%  99% 100%   SpO2: 100 %   Intake/Output Summary (Last 24 hours) at 06/27/2023 0815 Last data filed at 06/27/2023 8119 Gross per 24 hour  Intake 240 ml  Output 125 ml  Net 115 ml   There were no vitals filed for this visit.  Exam: General exam: In no acute distress. Respiratory system: Good air movement and diffuse crackles bilaterally Cardiovascular system: S1 & S2 heard, RRR. No JVD. Gastrointestinal system: Abdomen is nondistended, soft and nontender.  Extremities: No pedal edema. Skin: No rashes, lesions or ulcers Psychiatry: Judgement and insight appear normal. Mood & affect appropriate.  Data Reviewed:    Labs: Basic Metabolic Panel: Recent Labs  Lab 06/22/23 0603 06/26/23 2053  NA 136 138  K 4.6 4.7  CL 100 103  CO2 25 21*  GLUCOSE 109* 91  BUN 29* 27*  CREATININE 1.49* 1.29*  CALCIUM 8.9 9.4  MG 2.2  --   PHOS 4.5  --    GFR Estimated Creatinine Clearance: 60.4 mL/min (A) (by C-G formula based on SCr of 1.29 mg/dL (H)). Liver Function Tests: Recent Labs  Lab 06/22/23 0603 06/26/23 2053  AST 25 29  ALT 11 11  ALKPHOS 113 143*  BILITOT 0.5 0.7  PROT 7.3 8.1  ALBUMIN 2.8*  3.3*   No results for input(s): "LIPASE", "AMYLASE" in the last 168 hours. No results for input(s): "AMMONIA" in the last 168 hours. Coagulation profile No results for input(s): "INR", "PROTIME" in the last 168 hours. COVID-19 Labs  No results for input(s): "DDIMER", "FERRITIN", "LDH", "CRP" in the last 72 hours.  Lab Results  Component Value Date   SARSCOV2NAA NEGATIVE 06/19/2023   SARSCOV2NAA NEGATIVE 06/11/2023   SARSCOV2NAA NEGATIVE 05/25/2023   SARSCOV2NAA NEGATIVE 07/18/2022    CBC: Recent Labs  Lab 06/22/23 0603 06/26/23 2053  WBC 8.4 18.5*  NEUTROABS 5.8 11.9*  HGB 11.0* 11.4*  HCT 35.0* 36.6*  MCV 91.6 92.7  PLT 378 411*   Cardiac Enzymes: No results for input(s): "CKTOTAL", "CKMB", "CKMBINDEX", "TROPONINI" in the last 168 hours. BNP (last 3 results) No results for input(s): "PROBNP" in the last 8760 hours. CBG: No results for input(s): "GLUCAP" in the last 168 hours. D-Dimer: No results for input(s): "DDIMER" in the last 72 hours. Hgb A1c: No results for input(s): "HGBA1C" in the last 72 hours. Lipid Profile: No results for input(s): "CHOL", "HDL", "LDLCALC", "TRIG", "CHOLHDL", "LDLDIRECT" in the last 72 hours. Thyroid function studies: No results for input(s): "TSH", "T4TOTAL", "T3FREE", "THYROIDAB" in the last 72 hours.  Invalid input(s): "FREET3" Anemia work up: No results for input(s): "VITAMINB12", "FOLATE", "FERRITIN", "TIBC", "IRON", "RETICCTPCT" in the last 72 hours. Sepsis Labs: Recent Labs  Lab 06/22/23 0603 06/26/23 2053 06/26/23 2303  WBC 8.4 18.5*  --   LATICACIDVEN  --   --  2.1*   Microbiology Recent Results (from the past 240 hour(s))  Resp panel by RT-PCR (RSV, Flu A&B, Covid) Anterior Nasal Swab     Status: None   Collection Time: 06/19/23  5:01 PM   Specimen: Anterior Nasal Swab  Result Value Ref Range Status   SARS Coronavirus 2 by RT PCR NEGATIVE NEGATIVE Final    Comment: (NOTE) SARS-CoV-2 target nucleic acids are NOT  DETECTED.  The SARS-CoV-2 RNA is generally detectable in upper respiratory specimens during the acute phase of infection. The lowest concentration of SARS-CoV-2 viral copies this assay can detect is 138 copies/mL. A negative result does not preclude SARS-Cov-2 infection and should not be used as the sole basis for treatment or other patient management decisions. A negative result may occur with  improper specimen collection/handling, submission of specimen other than nasopharyngeal swab, presence of viral mutation(s) within the areas targeted by this assay, and inadequate number of viral copies(<138 copies/mL). A negative result must be combined with clinical observations, patient history, and epidemiological information. The expected result is Negative.  Fact Sheet for Patients:  BloggerCourse.com  Fact Sheet for Healthcare Providers:  SeriousBroker.it  This test is no t yet approved or cleared by the [place] FDA and  has been authorized for detection and/or diagnosis of  SARS-CoV-2 by FDA under an Emergency Use Authorization (EUA). This EUA will remain  in effect (meaning this test can be used) for the duration of the COVID-19 declaration under Section 564(b)(1) of the Act, 21 U.S.C.section 360bbb-3(b)(1), unless the authorization is terminated  or revoked sooner.       Influenza A by PCR NEGATIVE NEGATIVE Final   Influenza B by PCR NEGATIVE NEGATIVE Final    Comment: (NOTE) The Xpert Xpress SARS-CoV-2/FLU/RSV plus assay is intended as an aid in the diagnosis of influenza from Nasopharyngeal swab specimens and should not be used as a sole basis for treatment. Nasal washings and aspirates are unacceptable for Xpert Xpress SARS-CoV-2/FLU/RSV testing.  Fact Sheet for Patients: BloggerCourse.com  Fact Sheet for Healthcare Providers: SeriousBroker.it  This test is not yet  approved or cleared by the [place] FDA and has been authorized for detection and/or diagnosis of SARS-CoV-2 by FDA under an Emergency Use Authorization (EUA). This EUA will remain in effect (meaning this test can be used) for the duration of the COVID-19 declaration under Section 564(b)(1) of the Act, 21 U.S.C. section 360bbb-3(b)(1), unless the authorization is terminated or revoked.     Resp Syncytial Virus by PCR NEGATIVE NEGATIVE Final    Comment: (NOTE) Fact Sheet for Patients: BloggerCourse.com  Fact Sheet for Healthcare Providers: SeriousBroker.it  This test is not yet approved or cleared by the [place] FDA and has been authorized for detection and/or diagnosis of SARS-CoV-2 by FDA under an Emergency Use Authorization (EUA). This EUA will remain in effect (meaning this test can be used) for the duration of the COVID-19 declaration under Section 564(b)(1) of the Act, 21 U.S.C. section 360bbb-3(b)(1), unless the authorization is terminated or revoked.  Performed at [facility], [address], [place], [place] [address]   Culture, blood (Routine X 2) w Reflex to ID Panel     Status: None   Collection Time: 06/20/23 12:30 AM   Specimen: BLOOD  Result Value Ref Range Status   Specimen Description   Final    BLOOD LEFT ANTECUBITAL Performed at [facility], [address], [place], [place] [address]    Special Requests   Final    BOTTLES DRAWN AEROBIC AND ANAEROBIC Blood Culture results may not be optimal due to an inadequate volume of blood received in culture bottles Performed at [facility], [address] [place], [place] [address]    Culture   Final    NO GROWTH 5 DAYS Performed at [facility], [address], [place], [place] [address]    Report Status 06/25/2023 FINAL  Final  Culture, blood (Routine X 2) w Reflex to ID Panel     Status:  None   Collection Time: 06/20/23 12:35 AM   Specimen: BLOOD  Result Value Ref Range Status   Specimen Description   Final    BLOOD BLOOD LEFT FOREARM Performed at [facility], [address], [place], [place] [address]    Special Requests   Final    BOTTLES DRAWN AEROBIC AND ANAEROBIC Blood Culture results may not be optimal due to an inadequate volume of blood received in culture bottles Performed at [facility], [address], [place], [place] [address]    Culture   Final    NO GROWTH 5 DAYS Performed at [facility], [address] [place], [place] [address]    Report Status 06/25/2023 FINAL  Final  MRSA  Next Gen by PCR, Nasal     Status: None   Collection Time: 06/20/23  2:16 PM   Specimen: Nasal Mucosa; Nasal Swab  Result Value Ref Range Status   MRSA by PCR Next Gen NOT DETECTED NOT DETECTED Final    Comment: (NOTE) The GeneXpert MRSA Assay (FDA approved for NASAL specimens only), is one component of a comprehensive MRSA colonization surveillance program. It is not intended to diagnose MRSA infection nor to guide or monitor treatment for MRSA infections. Test performance is not FDA approved in patients less than 24 years old. Performed at [facility], [address], [place], [place] [address]      Medications:    atorvastatin  40 mg Oral Daily   carvedilol  3.125 mg Oral BID   clopidogrel  75 mg Oral Daily   enoxaparin (LOVENOX) injection  40 mg Subcutaneous Q24H   furosemide  40 mg Oral Daily   guaiFENesin  600 mg Oral BID   nicotine  14 mg Transdermal Daily   sodium chloride flush  3 mL Intravenous Q12H   umeclidinium bromide  1 puff Inhalation Daily   Continuous Infusions:  ampicillin-sulbactam (UNASYN) IV 3 g (06/27/23 0435)      LOS: 0 days   Marinda Elk  [facility]  06/27/2023, 8:15 AM

## 2023-06-27 NOTE — ED Provider Notes (Signed)
Care assumed from Dr. Wilkie Aye.  Patient returns with ongoing shortness of breath and productive cough.  Has not been admitted several times with multifocal pneumonia.  Awaiting lactic acid, CT scan to assess for PE as well as blood cultures.  Received vancomycin and cefepime.    IMPRESSION: 1. No evidence of pulmonary embolism. 2. Stable 8 mm noncalcified right upper lobe lung nodule. Non-contrast chest CT at 6-12 months is recommended. If the nodule is stable at time of repeat CT, then future CT at 18-24 months (from today's scan) is considered optional for low-risk patients, but is recommended for high-risk patients. This recommendation follows the consensus statement: Guidelines for Management of Incidental Pulmonary Nodules Detected on CT Images: From the Fleischner Society 2017; Radiology 2017; 284:228-243. 3. Stable moderate severity areas of consolidation within the bilateral lower lobes and right middle lobe, likely infectious in origin. Follow-up to resolution is recommended to exclude the presence of an underlying neoplastic process. 4. Stable ground-glass appearing areas of lung parenchyma bilaterally which may represent sequelae associated with pulmonary edema or and inflammatory process such as pneumonitis.   CT scan as above.  Does show stable 8 mm nodule which needs follow-up in 12 months.  Does have severe consolidation in bilateral lower lobes and right middle lobe.  Given failure of outpatient treatment plan admission for IV antibiotics.  Discussed with Dr. Allena Katz.   Glynn Octave, MD 06/27/23 [phone]

## 2023-06-27 NOTE — Progress Notes (Signed)
Pt admitted from ED. Assisted to transfer by sliding from stretcher to bed. Pt noted to have significant weakness. Discussed safety precautions including not getting OOB unassisted. Pt will need reinforcement of teaching. Bed alarm set on medium setting. Pt report IV site itching and painful, appears infiltrated. IV team consult placed due to need for US guided placement. Continue to monitor. Mick Sell RN

## 2023-06-27 NOTE — Telephone Encounter (Signed)
Called patient let him know he can call office when he is released so we can schedule transportation . Patient stated he would call.

## 2023-06-27 NOTE — Progress Notes (Signed)
Modified Barium Swallow Study  Patient Details  Name: Craig Schmidt MRN: [id number] Date of Birth: 03/09/1971  Today's Date: 06/27/2023  Modified Barium Swallow completed.  Full report located under Chart Review in the Imaging Section.  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  Per chart pt has been to the hospital 5 times for this issue since 05/25/23. CXR Mild bibasilar infiltrates. PMH: stroke 2023, CAD, CKD, brain tumor, ETOH abuse. BSE 11/30/22 no signs of aspiration in reclined position due to pain, denied dysphagia and unlikely to suffer from silent aspiration and denied reflux or regurgitation. Recommended to continue regular diet, thin liquids. MBS to assess oropharyngeal swallow given multiple admissions for pna over past year, history of stroke and pharyngeal globus sensation.   Clinical Impression Pt demonstrated a normal oropharyngeal sequence with adequate oral control, bolus cohesion, mastication and complete oral clearance. His laryngeal ROM and strength were within normal range for hyolaryngeal excursion, epiglottic inversion and laryngeal closure which prevented penetration and aspiration. Swallow integrity was challenged with multiple/sequential cup sips with thin and use of straw. Pt reported pharyngeal globus sensation although there was no pharyngeal residue observed and scan of esophagus revealed complete clearance. Pt has history of ETOH abuse and therapist suspects he may have occasional esophageal irritation or undiagnosed involvement contributing to pharyngeal globus sensation. Recommend he continue regular texture, thin liquids, pills with thin and no further ST is needed at this time. Factors that may increase risk of adverse event in presence of aspiration Rubye Oaks & Clearance Coots 2021):    Swallow Evaluation  Recommendations Recommendations: PO diet PO Diet Recommendation: Regular;Thin liquids (Level 0) Liquid Administration via: Cup;Straw Medication Administration: Whole meds with liquid Supervision: Staff to assist with self-feeding;Other (comment) (limited use of right hand after stroke) Postural changes: Position pt fully upright for meals Oral care recommendations: Oral care BID (2x/day)      Royce Macadamia 06/27/2023,3:33 PM

## 2023-06-28 ENCOUNTER — Encounter (HOSPITAL_COMMUNITY): Payer: Self-pay | Admitting: Anesthesiology

## 2023-06-28 ENCOUNTER — Encounter (HOSPITAL_COMMUNITY)
Admission: EM | Disposition: A | Discharge status: Home / Self Care | Payer: Self-pay | Source: Home / Self Care | Attending: Internal Medicine

## 2023-06-28 DIAGNOSIS — I5042 Chronic combined systolic (congestive) and diastolic (congestive) heart failure: Secondary | ICD-10-CM | POA: Diagnosis not present

## 2023-06-28 DIAGNOSIS — I10 Essential (primary) hypertension: Secondary | ICD-10-CM | POA: Diagnosis not present

## 2023-06-28 DIAGNOSIS — J189 Pneumonia, unspecified organism: Secondary | ICD-10-CM | POA: Diagnosis not present

## 2023-06-28 SURGERY — CANCELLED PROCEDURE

## 2023-06-28 MED ORDER — PROPOFOL 10 MG/ML IV BOLUS
INTRAVENOUS | Status: AC
Start: 2023-06-28 — End: ?
  Filled 2023-06-28: qty 20

## 2023-06-28 MED ORDER — FENTANYL CITRATE (PF) 100 MCG/2ML IJ SOLN
INTRAMUSCULAR | Status: AC
  Filled 2023-06-28: qty 2

## 2023-06-28 NOTE — Progress Notes (Signed)
Orthopedic Tech Progress Note Patient Details:  Craig Schmidt 03/08/1971 [id number]  Ortho Devices Type of Ortho Device: Arm sling Ortho Device/Splint Location: RUE Ortho Device/Splint Interventions: Application   Post Interventions Patient Tolerated: Well  Jameire Kouba E Laurajean Hosek 06/28/2023, 11:09 AM

## 2023-06-28 NOTE — Evaluation (Signed)
Occupational Therapy Evaluation/Discharge Patient Details Name: Craig Schmidt MRN: [id number] DOB: 04-Feb-1971 Today's Date: 06/28/2023   History of Present Illness 52 y.o. male with medical history significant for chronic combined systolic and diastolic CHF (EF <62%), CAD, history of CVA, asthma, HTN, HLD, substance use disorder (crack cocaine, EtOH), homelessness, and multiple recent admissions for multifocal lung infiltrates who presented to the ED for evaluation of shortness of breath and productive cough. . History of multiple admissions for multifocal pneumonia.  Couple times he left AMA, couple times he was septic requiring IV antibiotics.  Since being home has been compliant with his antibiotics but states that the shortness of breath is worsening, productive cough continues, along with chills.   Clinical Impression   Patient evaluated by Occupational Therapy with no further acute OT needs identified. All education has been completed and the patient has no further questions. Pt presents with one finger subluxation in R shoulder from chronic CVA. Arm sling ordered to provide shoulder support during activity and rest. Pt reports that he had one previously although lost it. No follow-up Occupational Therapy or equipment needs. OT is signing off. Thank you for this referral.        If plan is discharge home, recommend the following: A little help with walking and/or transfers;A lot of help with bathing/dressing/bathroom;Help with stairs or ramp for entrance    Functional Status Assessment  Patient has not had a recent decline in their functional status  Equipment Recommendations  Other (comment) (Arm sling for RUE shoulder subluxation)       Precautions / Restrictions Precautions Precautions: Fall Precaution Comments: Baseline R hemi. R shoulder subluxation - support UE when walking, in bed, and when sitting up in recliner. Restrictions Weight Bearing Restrictions: No       Mobility Bed Mobility Overal bed mobility: Needs Assistance Bed Mobility: Supine to Sit Rolling: Supervision, Used rails         General bed mobility comments: No physical assist needed to complete bed mobility.    Transfers Overall transfer level: Needs assistance Equipment used: Straight cane Transfers: Sit to/from Stand, Bed to chair/wheelchair/BSC Sit to Stand: Contact guard assist     Step pivot transfers: Contact guard assist     General transfer comment: able to stand from EOB with CGA/supervision. No LOB while standing and walking to/from bathroom from bed.      Balance Overall balance assessment: Needs assistance Sitting-balance support: Feet supported, No upper extremity supported Sitting balance-Leahy Scale: Good Sitting balance - Comments: Pt with significant trunk flexion despite cues due to back pain, but no LOB   Standing balance support: Single extremity supported, During functional activity, Reliant on assistive device for balance Standing balance-Leahy Scale: Fair Standing balance comment: Prefers to hold lightly onto wall/grab bar/bed foot board for standing support. No LOB noted.            ADL either performed or assessed with clinical judgement   ADL Overall ADL's : At baseline;Needs assistance/impaired                    General ADL Comments: Male friend assists patient at baseline for all BADL tasks due to R side residual deficits. Patient reports at baseline during evaluation while friend assisted with LB bathing and toileting.     Vision Baseline Vision/History: 1 Wears glasses Ability to See in Adequate Light: 0 Adequate Patient Visual Report: Blurring of vision Vision Assessment?: Wears glasses for reading Additional Comments: Pt reports that since  CVA, his vision has changed and is more blurry. Unable to read clock.     Perception Perception: Not tested       Praxis Praxis: Not tested       Pertinent Vitals/Pain  Pain Assessment Pain Assessment: No/denies pain (reports low back pain at night.)     Extremity/Trunk Assessment Upper Extremity Assessment Upper Extremity Assessment: Right hand dominant;RUE deficits/detail RUE Deficits / Details: Baseline right side residual deficits from CVA 2023. 1/5 shoulder flexion, 3/5 elbow flexion/extension, 3/5 wrist flexion, 3-/5 wrist extension. 50% composite finger extension, 75% composite finger flexion, impaired gross grasp. 1 finger subluxation noted. Pt verbalizes pain when fingers passively extended. RUE: Subluxation noted RUE Sensation: decreased light touch RUE Coordination: decreased fine motor;decreased gross motor   Lower Extremity Assessment Lower Extremity Assessment: Defer to PT evaluation RLE Deficits / Details: hx R residual deficits from CVA   Cervical / Trunk Assessment Cervical / Trunk Assessment: Kyphotic;Other exceptions Cervical / Trunk Exceptions: forward head, rounded shoulders, posterior pelvic tilt   Communication Communication Communication: Difficulty communicating thoughts/reduced clarity of speech (Speech slightly garbled at time and spoke in low level. Improved as session progressed.) Cueing Techniques: Verbal cues   Cognition Arousal: Alert Behavior During Therapy: Flat affect Overall Cognitive Status: Within Functional Limits for tasks assessed          General Comments: followed all directions and answered questions approrpriately during evaluation.     General Comments  No edema in BLE noted.            Home Living Family/patient expects to be discharged to:: Shelter/Homeless   Available Help at Discharge: Friend(s);Available 24 hours/day (Male friend is with him 24/7) Type of Home: Homeless      Additional Comments: reports living in the streets but working with case managers for housing options      Prior Functioning/Environment Prior Level of Function : Needs assist             Mobility  Comments: uses [facility]. Male friend reports that she has to help step his right foot forward at times when he is unable to. ADLs Comments: Male friend assists him with all ADL tasks at baseline since CVA Feb/March 2023 and has residual R side weakness        OT Problem List: Decreased activity tolerance         OT Goals(Current goals can be found in the care plan section) Acute Rehab OT Goals Patient Stated Goal: to go to the bathroom OT Goal Formulation: All assessment and education complete, DC therapy  OT Frequency: Min 1X/week    Co-evaluation PT/OT/SLP Co-Evaluation/Treatment: Yes Reason for Co-Treatment: To address functional/ADL transfers   OT goals addressed during session: ADL's and self-care;Proper use of Adaptive equipment and DME;Strengthening/ROM      AM-PAC OT "6 Clicks" Daily Activity     Outcome Measure Help from another person eating meals?: A Little Help from another person taking care of personal grooming?: A Lot Help from another person toileting, which includes using toliet, bedpan, or urinal?: Total Help from another person bathing (including washing, rinsing, drying)?: Total Help from another person to put on and taking off regular upper body clothing?: Total Help from another person to put on and taking off regular lower body clothing?: Total 6 Click Score: 9   End of Session Equipment Utilized During Treatment: Gait belt;Other (comment) [facility]) Nurse Communication: Mobility status  Activity Tolerance: Patient tolerated treatment well Patient left: in chair;with call bell/phone within reach;with  family/visitor present  OT Visit Diagnosis: Unsteadiness on feet (R26.81);Other abnormalities of gait and mobility (R26.89);Muscle weakness (generalized) (M62.81)                Time: 2952-8413 OT Time Calculation (min): 18 min Charges:  OT General Charges $OT Visit: 1 Visit  Limmie Patricia, OTR/L,CBIS  Supplemental OT - [facility] and [facility] Secure Chat Preferred     De Libman, Charisse March 06/28/2023, 10:54 AM

## 2023-06-28 NOTE — Anesthesia Preprocedure Evaluation (Signed)
Anesthesia Evaluation    Airway        Dental   Pulmonary COPD,  COPD inhaler, Current Smoker and Patient abstained from smoking.          Cardiovascular hypertension, Pt. on medications and Pt. on home beta blockers + CAD, + Past MI and +CHF    06/27/2023 ECHO: EF <20%, severely decreased LVF with global hypokinesis, Grade 2 DD, mod reduced RVF, mod mitral stenosis, mild MR  '23 cath:   Occlusion of apical LAD, suspect embolic versus plaque rupture with thrombosis.  Right dominant coronary anatomy.  Significant increase in diameter of right coronary after 200 mcg of intracoronary nitroglycerin was administered.  Generalized luminal irregularities throughout the left coronary system.  Severe systolic dysfunction with EF less than 25% and LVEDP greater than 25 mmHg.    Neuro/Psych H/o brain tumor CVA    GI/Hepatic ,,,(+)     substance abuse  alcohol use and cocaine use  Endo/Other    Renal/GU Renal InsufficiencyRenal disease     Musculoskeletal   Abdominal   Peds  Hematology   Anesthesia Other Findings   Reproductive/Obstetrics                              Anesthesia Physical Anesthesia Plan  ASA: 4  Anesthesia Plan: General   Post-op Pain Management: Minimal or no pain anticipated   Induction: Intravenous  PONV Risk Score and Plan: 1 and Ondansetron and Dexamethasone  Airway Management Planned: Oral ETT  Additional Equipment: None  Intra-op Plan:   Post-operative Plan: Extubation in OR  Informed Consent:   Plan Discussed with:   Anesthesia Plan Comments:          Anesthesia Quick Evaluation

## 2023-06-28 NOTE — Discharge Instructions (Signed)
 FOOD PANTRY [employer] [facility] [address] [phone]  [facility] [employer] [address] [place] [phone]  [facility] [employer] [address] [phone]  [facility] [address] [phone]  [facility] [address] [place], [place] [address] [phone]

## 2023-06-28 NOTE — Progress Notes (Signed)
[facility] PROGRESS NOTE    Progress Note  Craig Schmidt  UJW:[id number] DOB: [birth year] DOA: 06/26/2023 PCP: Rema Fendt, NP     Brief Narrative:   Craig Schmidt is an 52 y.o. 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   Assessment/Plan:   Persistent multifocal lung infiltrates: Continue IV Unasyn, has remained afebrile, leukocytosis is resolved.  It is interesting that his fever and white blood cell count resolve immediately after being put on IV antibiotics, IV Unasyn. Blood cultures have been negative till date. Plavix was held pulmonary was consulted he will need a bronchoscopy. Rheumatology panel serum QuantiFERON has been sent. 2D echo showed an EF of less than 20% no wall motion abnormality and grade 2 diastolic dysfunction, moderate mitral stenosis and aortic valve regurgitation no evidence of endocarditis  Chronic combined systolic and diastolic CHF (congestive heart failure): Last EF of 20% appears euvolemic on admission. Continue Coreg and Lasix monitor strict I's and O's and daily weights restrict fluids.  Coronary artery disease Asymptomatic continue  statins. Hold Plavix.  History of CVA: Continue statins. Hold Plavix for bronchoscopy.  Essential hypertension: Continue Coreg and Lasix.  Asthma: Continue inhalers.  Hyperlipidemia: Continue statins.  Polysubstance abuse: Patient reports ongoing crack use.  DVT prophylaxis: lovenox Family Communication:none Status is: Observation The patient will require care spanning > 2 midnights and should be moved to  inpatient because: Multifocal pneumonia    Code Status:     Code Status Orders  (From admission, onward)           Start     Ordered   06/27/23 0054  Full code  Continuous       Question:  By:  Answer:  Consent: discussion documented in EHR   06/27/23 0055           Code Status History     Date Active Date Inactive Code Status Order ID Comments User Context   06/19/2023 2315 06/22/2023 2017 Full Code [id number]  Steffanie Rainwater, MD ED   06/11/2023 0225 06/14/2023 0010 Full Code [id number]  Howerter, Chaney Born, DO ED   06/04/2023 0810 06/10/2023 1739 Full Code [id number]  Lorin Glass, MD ED   05/25/2023 1820 05/31/2023 2048 Full Code [id number]  Nolberto Hanlon, MD ED   11/28/2022 1620 12/05/2022 2107 Full Code [id number]  Jonah Blue, MD ED   04/13/2022 0842 04/13/2022 1705 Full Code [id number]  Laurey Morale, MD Inpatient   12/19/2021 1909 12/27/2021 2321 Full Code [id number]  Lyn Records, MD Inpatient   09/08/2021 0931 10/03/2021 2351 Full Code [id number]  Milon Dikes, MD ED   05/14/2015 1117 05/18/2015 1701 Full Code [id number]  Dorothea Ogle, MD ED         IV Access:   Peripheral IV   Procedures and diagnostic studies:   DG Swallowing Func-Speech Pathology  Result Date: 06/27/2023 Table formatting from the original result was not included. Modified Barium Swallow Study Patient Details Name: Craig Schmidt MRN: [id number] Date of Birth: [birth year] Today's Date: 06/27/2023 HPI/PMH: HPI: 52 year old homeless man with addiction to cigarettes and crack  cocaine presenting with >1 month dry cough, chills, pleurisy with background of 6 months unintentional weight loss.  Per chart pt has been to the hospital 5 times for this issue since 05/25/23. CXR Mild bibasilar infiltrates. PMH: stroke 2023, CAD, CKD, brain tumor, ETOH abuse. BSE 11/30/22 no signs of aspiration in reclined position due to pain, denied dysphagia and unlikely to suffer from silent aspiration and denied reflux  or regurgitation. Recommended to continue regular diet, thin liquids. MBS to assess oropharyngeal swallow given multiple admissions for pna over past year, history of stroke and pharyngeal globus sensation. Clinical Impression: Clinical Impression: Pt demonstrated a normal oropharyngeal sequence with adequate oral control, bolus cohesion, mastication and complete oral clearance. His laryngeal ROM and strength were within normal range for hyolaryngeal excursion, epiglottic inversion and laryngeal closure which prevented penetration and aspiration. Swallow integrity was challenged with multiple/sequential cup sips with thin and use of straw. Pt reported pharyngeal globus sensation although there was no pharyngeal residue observed and scan of esophagus revealed complete clearance. Pt has history of ETOH abuse and therapist suspects he may have occasional esophageal irritation or undiagnosed involvement contributing to pharyngeal globus sensation. Recommend he continue regular texture, thin liquids, pills with thin and no further ST is needed at this time. Factors that may increase risk of adverse event in presence of aspiration Rubye Oaks & Clearance Coots 2021): No data recorded Recommendations/Plan: Swallowing Evaluation Recommendations Swallowing Evaluation Recommendations Recommendations: PO diet PO Diet Recommendation: Regular; Thin liquids (Level 0) Liquid Administration via: Cup; Straw Medication Administration: Whole meds with liquid Supervision: Staff to assist with self-feeding; Other (comment) (limited use of right hand after stroke) Postural changes: Position pt fully upright for meals Oral care recommendations: Oral care BID (2x/day) Treatment Plan Treatment Plan Treatment recommendations: No treatment recommended at this time Follow-up recommendations: No SLP follow up Functional status assessment: Patient has not had a recent decline in their functional status. Recommendations Recommendations for follow up therapy  are one component of a multi-disciplinary discharge planning process, led by the attending physician.  Recommendations may be updated based on patient status, additional functional criteria and insurance authorization. Assessment: Orofacial Exam: Orofacial Exam Oral Cavity: Oral Hygiene: WFL Oral Cavity - Dentition: Other (Comment) (missing 1-2 upper but majority intact) Orofacial Anatomy: WFL Oral Motor/Sensory Function: WFL Anatomy: Anatomy: WFL Boluses Administered: Boluses Administered Boluses Administered: Thin liquids (Level 0); Mildly thick liquids (Level 2, nectar thick); Moderately thick liquids (Level 3, honey thick); Puree; Solid  Oral Impairment Domain: Oral Impairment Domain Lip Closure: No labial escape Tongue control during bolus hold: Cohesive bolus between tongue to palatal seal Bolus preparation/mastication: Timely and efficient chewing and mashing Bolus transport/lingual motion: Brisk tongue motion Oral residue: Complete oral clearance Location of oral residue : N/A Initiation of pharyngeal swallow : Valleculae  Pharyngeal Impairment Domain: Pharyngeal Impairment Domain Soft palate elevation: No bolus between soft palate (SP)/pharyngeal wall (PW) Laryngeal elevation: Complete superior movement of thyroid cartilage with complete approximation of arytenoids to epiglottic petiole Anterior hyoid excursion: Complete anterior movement Epiglottic movement: Complete inversion Laryngeal vestibule closure: Complete, no air/contrast in laryngeal vestibule Pharyngeal stripping wave : Present - complete Pharyngeal contraction (A/P view only): N/A Pharyngoesophageal segment opening: Complete distension and complete duration, no obstruction of flow Tongue base retraction: No contrast between tongue base and posterior pharyngeal wall (PPW) Pharyngeal residue: Complete pharyngeal clearance Location of pharyngeal residue: N/A  Esophageal Impairment Domain: Esophageal Impairment Domain Esophageal clearance upright  position: Complete clearance, esophageal coating Pill: No data recorded Penetration/Aspiration Scale  Score: Penetration/Aspiration Scale Score 1.  Material does not enter airway: Thin liquids (Level 0); Mildly thick liquids (Level 2, nectar thick); Moderately thick liquids (Level 3, honey thick); Puree; Solid Compensatory Strategies: Compensatory Strategies Compensatory strategies: -- (N/A)   General Information: Caregiver present: No  Diet Prior to this Study: Regular; Thin liquids (Level 0)   Temperature : Normal   Respiratory Status: WFL   Supplemental O2: None (Room air)   History of Recent Intubation: No  Behavior/Cognition: Alert; Cooperative; Pleasant mood Self-Feeding Abilities: Able to self-feed; Other (Comment) (held cup with left hand) Baseline vocal quality/speech: Normal Volitional Cough: Able to elicit Volitional Swallow: Able to elicit Exam Limitations: No limitations Goal Planning: No data recorded No data recorded No data recorded No data recorded Consulted and agree with results and recommendations: Patient; Nurse; Physician Pain: Pain Assessment Pain Assessment: Faces Faces Pain Scale: 0 Breathing: 0 Negative Vocalization: 0 Facial Expression: 0 Body Language: 0 Consolability: 0 PAINAD Score: 0 Pain Location: "around ribs when he coughs" End of Session: Start Time:SLP Start Time (ACUTE ONLY): 1443 Stop Time: SLP Stop Time (ACUTE ONLY): 1455 Time Calculation:SLP Time Calculation (min) (ACUTE ONLY): 12 min Charges: SLP Evaluations $ SLP Speech Visit: 1 Visit SLP Evaluations $BSS Swallow: 1 Procedure $MBS Swallow: 1 Procedure SLP visit diagnosis: SLP Visit Diagnosis: Dysphagia, unspecified (R13.10) Past Medical History: Past Medical History: Diagnosis Date  Alcohol abuse   Asthma   Brain tumor (HCC)   CAD (coronary artery disease)   Chronic HFrEF (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)   Chronic kidney disease, stage 3 (HCC)   Cocaine abuse (HCC)   History of medication noncompliance    Homelessness   MI (myocardial infarction) (HCC)   STEMI (ST elevation myocardial infarction) (HCC)   Stroke [facility])  Past Surgical History: Past Surgical History: Procedure Laterality Date  BUBBLE STUDY  09/11/2021  Procedure: BUBBLE STUDY;  Surgeon: Pricilla Riffle, MD;  Location: [facility] ENDOSCOPY;  Service: Cardiovascular;;  LEFT HEART CATH AND CORONARY ANGIOGRAPHY N/A 12/19/2021  Procedure: LEFT HEART CATH AND CORONARY ANGIOGRAPHY;  Surgeon: Lyn Records, MD;  Location: [facility] INVASIVE CV LAB;  Service: Cardiovascular;  Laterality: N/A;  RIGHT/LEFT HEART CATH AND CORONARY ANGIOGRAPHY N/A 04/13/2022  Procedure: RIGHT/LEFT HEART CATH AND CORONARY ANGIOGRAPHY;  Surgeon: Laurey Morale, MD;  Location: [facility] INVASIVE CV LAB;  Service: Cardiovascular;  Laterality: N/A;  TEE WITHOUT CARDIOVERSION N/A 09/11/2021  Procedure: TRANSESOPHAGEAL ECHOCARDIOGRAM (TEE);  Surgeon: Pricilla Riffle, MD;  Location: [facility] ENDOSCOPY;  Service: Cardiovascular;  Laterality: N/A; Royce Macadamia 06/27/2023, 3:32 PM  ECHOCARDIOGRAM COMPLETE  Result Date: 06/27/2023    ECHOCARDIOGRAM REPORT   Patient Name:   Craig Schmidt Date of Exam: 06/27/2023 Medical Rec #:  [id number]          Height:       67.0 in Accession #:    [id number]         Weight:       137.3 lb Date of Birth:  1971/06/29           BSA:          1.724 m Patient Age:    52 years           BP:           130/93 mmHg Patient Gender: M                  HR:  93 bpm. Exam Location:  Inpatient Procedure: 2D Echo, Cardiac Doppler and Color Doppler Indications:    Endocarditits I38  History:        Patient has prior history of Echocardiogram examinations, most                 recent 05/27/2023. CHF and Cardiomyopathy, CAD and Acute MI,                 Stroke; Risk Factors:Hypertension, Current Smoker and                 Dyslipidemia.  Sonographer:    Lucendia Herrlich RCS Referring Phys: [id number] Vinnie Level SMITH IMPRESSIONS  1. Left ventricular ejection fraction, by estimation, is <20%. The  left ventricle has severely decreased function. The left ventricle demonstrates global hypokinesis. The left ventricular internal cavity size was mildly to moderately dilated. Left ventricular diastolic parameters are consistent with Grade II diastolic dysfunction (pseudonormalization).  2. Right ventricular systolic function is moderately reduced. The right ventricular size is mildly enlarged.  3. Left atrial size was severely dilated.  4. Right atrial size was mildly dilated.  5. The mitral valve is grossly normal. There is mild thickening of the mitral valve with restricted excursion of valve leaflets. Moderate mitral stenosis.  6. The aortic valve is normal in structure. Aortic valve regurgitation is trivial.  7. No overt signs of endocarditis.  8. The inferior vena cava is normal in size with greater than 50% respiratory variability, suggesting right atrial pressure of 3 mmHg. FINDINGS  Left Ventricle: Left ventricular ejection fraction, by estimation, is <20%. The left ventricle has severely decreased function. The left ventricle demonstrates global hypokinesis. The left ventricular internal cavity size was mildly to moderately dilated. There is no left ventricular hypertrophy. Left ventricular diastolic parameters are consistent with Grade II diastolic dysfunction (pseudonormalization). Right Ventricle: The right ventricular size is mildly enlarged. No increase in right ventricular wall thickness. Right ventricular systolic function is moderately reduced. Left Atrium: Left atrial size was severely dilated. Right Atrium: Right atrial size was mildly dilated. Pericardium: There is no evidence of pericardial effusion. Mitral Valve: The mitral valve is normal in structure. There is mild thickening of the mitral valve leaflet(s). Mild mitral valve regurgitation. Tricuspid Valve: The tricuspid valve is normal in structure. Tricuspid valve regurgitation is trivial. Aortic Valve: The aortic valve is normal in  structure. Aortic valve regurgitation is trivial. Aortic valve peak gradient measures 4.8 mmHg. Pulmonic Valve: The pulmonic valve was normal in structure. Pulmonic valve regurgitation is trivial. Aorta: The aortic root and ascending aorta are structurally normal, with no evidence of dilitation. Venous: The inferior vena cava is normal in size with greater than 50% respiratory variability, suggesting right atrial pressure of 3 mmHg. IAS/Shunts: No atrial level shunt detected by color flow Doppler.  LEFT VENTRICLE PLAX 2D LVIDd:         5.70 cm      Diastology LVIDs:         5.10 cm      LV e' medial:    5.27 cm/s LV PW:         0.90 cm      LV E/e' medial:  31.7 LV IVS:        0.70 cm      LV e' lateral:   5.27 cm/s LVOT diam:     2.00 cm      LV E/e' lateral: 31.7 LV SV:  35 LV SV Index:   20 LVOT Area:     3.14 cm                              3D Volume EF: LV Volumes (MOD)            3D EF:        24 % LV vol d, MOD A2C: 190.0 ml LV EDV:       251 ml LV vol d, MOD A4C: 181.0 ml LV ESV:       190 ml LV vol s, MOD A2C: 159.0 ml LV SV:        61 ml LV vol s, MOD A4C: 141.0 ml LV SV MOD A2C:     31.0 ml LV SV MOD A4C:     181.0 ml LV SV MOD BP:      36.9 ml RIGHT VENTRICLE            IVC RV S prime:     7.23 cm/s  IVC diam: 1.80 cm TAPSE (M-mode): 1.2 cm LEFT ATRIUM             Index        RIGHT ATRIUM           Index LA diam:        4.90 cm 2.84 cm/m   RA Area:     16.10 cm LA Vol (A2C):   82.1 ml 47.63 ml/m  RA Volume:   43.90 ml  25.47 ml/m LA Vol (A4C):   86.1 ml 49.95 ml/m LA Biplane Vol: 92.9 ml 53.89 ml/m  AORTIC VALVE                 PULMONIC VALVE AV Area (Vmax): 2.16 cm     PR End Diast Vel: 8.07 msec AV Vmax:        110.00 cm/s AV Peak Grad:   4.8 mmHg LVOT Vmax:      75.50 cm/s LVOT Vmean:     47.000 cm/s LVOT VTI:       0.112 m  AORTA Ao Root diam: 3.10 cm Ao Asc diam:  2.50 cm MITRAL VALVE                TRICUSPID VALVE MV Area (PHT): 4.29 cm     TR Peak grad:   45.2 mmHg MV Decel Time:  177 msec     TR Vmax:        336.00 cm/s MR Peak grad: 101.6 mmHg MR Vmax:      504.00 cm/s   SHUNTS MV E velocity: 167.00 cm/s  Systemic VTI:  0.11 m MV A velocity: 119.00 cm/s  Systemic Diam: 2.00 cm MV E/A ratio:  1.40 Aditya Sabharwal Electronically signed by Dorthula Nettles Signature Date/Time: 06/27/2023/1:34:57 PM    Final    CT Angio Chest PE W/Cm &/Or Wo Cm  Result Date: 06/27/2023 CLINICAL DATA:  Shortness of breath. EXAM: CT ANGIOGRAPHY CHEST WITH CONTRAST TECHNIQUE: Multidetector CT imaging of the chest was performed using the standard protocol during bolus administration of intravenous contrast. Multiplanar CT image reconstructions and MIPs were obtained to evaluate the vascular anatomy. RADIATION DOSE REDUCTION: This exam was performed according to the departmental dose-optimization program which includes automated exposure control, adjustment of the mA and/or kV according to patient size and/or use of iterative reconstruction technique. CONTRAST:  80mL OMNIPAQUE IOHEXOL 350 MG/ML SOLN COMPARISON:  June 19, 2023 FINDINGS: Cardiovascular: The thoracic aorta is normal in appearance. Satisfactory opacification of the pulmonary arteries to the segmental level. No evidence of pulmonary embolism. There is stable moderate severity cardiomegaly. No pericardial effusion. Mediastinum/Nodes: No enlarged mediastinal, hilar, or axillary lymph nodes. Thyroid gland, trachea, and esophagus demonstrate no significant findings. Lungs/Pleura: A stable 8 mm noncalcified lung nodule is seen within the anterolateral aspect of the right upper lobe (axial CT image 52, CT series 12). Stable ground-glass appearing areas of lung parenchyma are seen bilaterally. Mild lingular and mild anterolateral right middle lobe atelectasis and/or infiltrate is seen. Stable moderate severity areas of consolidation are noted within the anterolateral aspects of the bilateral lower lobes and posteromedial aspect of the right lower lobe.  No pleural effusion or pneumothorax is identified. Upper Abdomen: No acute abnormality. Musculoskeletal: No chest wall abnormality. No acute or significant osseous findings. Review of the MIP images confirms the above findings. IMPRESSION: 1. No evidence of pulmonary embolism. 2. Stable 8 mm noncalcified right upper lobe lung nodule. Non-contrast chest CT at 6-12 months is recommended. If the nodule is stable at time of repeat CT, then future CT at 18-24 months (from today's scan) is considered optional for low-risk patients, but is recommended for high-risk patients. This recommendation follows the consensus statement: Guidelines for Management of Incidental Pulmonary Nodules Detected on CT Images: From the Fleischner Society 2017; Radiology 2017; 284:228-243. 3. Stable moderate severity areas of consolidation within the bilateral lower lobes and right middle lobe, likely infectious in origin. Follow-up to resolution is recommended to exclude the presence of an underlying neoplastic process. 4. Stable ground-glass appearing areas of lung parenchyma bilaterally which may represent sequelae associated with pulmonary edema or and inflammatory process such as pneumonitis. Electronically Signed   By: Aram Candela M.D.   On: 06/27/2023 00:14   DG Chest 2 View  Result Date: 06/26/2023 CLINICAL DATA:  Chest pain and shortness of breath. EXAM: CHEST - 2 VIEW COMPARISON:  June 19, 2023 FINDINGS: The cardiac silhouette is mildly enlarged and unchanged in size. Mild, predominant stable patchy infiltrates are seen within the bilateral lung bases. No pleural effusion or pneumothorax is identified. The visualized skeletal structures are unremarkable. IMPRESSION: Mild bibasilar infiltrates. Electronically Signed   By: Aram Candela M.D.   On: 06/26/2023 21:22     Medical Consultants:   None.   Subjective:    Sheraz Sachar Liew relates his pleuritic chest pain is better.  Objective:    Vitals:    06/27/23 2045 06/28/23 0500 06/28/23 0559 06/28/23 0745  BP: (!) 143/108  (!) 145/92   Pulse: 96  95   Resp: 18  18   Temp: 97.7 F (36.5 C)  98.1 F (36.7 C)   TempSrc: Oral  Oral   SpO2: 100%  100%   Weight:  62.1 kg    Height:    5\' 7"  (1.702 m)   SpO2: 100 %   Intake/Output Summary (Last 24 hours) at 06/28/2023 1044 Last data filed at 06/28/2023 0618 Gross per 24 hour  Intake 553.65 ml  Output --  Net 553.65 ml   Filed Weights   06/28/23 0500  Weight: 62.1 kg    Exam: General exam: In no acute distress. Respiratory system: Good air movement and clear to auscultation. Cardiovascular system: S1 & S2 heard, RRR. No JVD. Gastrointestinal system: Abdomen is nondistended, soft and nontender.  Extremities: No pedal edema. Skin: No rashes, lesions or ulcers Psychiatry: Judgement and insight appear normal. Mood &  affect appropriate. Data Reviewed:    Labs: Basic Metabolic Panel: Recent Labs  Lab 06/22/23 0603 06/26/23 2053 06/27/23 1109  NA 136 138 136  K 4.6 4.7 4.5  CL 100 103 102  CO2 25 21* 22  GLUCOSE 109* 91 101*  BUN 29* 27* 26*  CREATININE 1.49* 1.29* 1.32*  CALCIUM 8.9 9.4 9.2  MG 2.2  --   --   PHOS 4.5  --   --    GFR Estimated Creatinine Clearance: 58.8 mL/min (A) (by C-G formula based on SCr of 1.32 mg/dL (H)). Liver Function Tests: Recent Labs  Lab 06/22/23 0603 06/26/23 2053  AST 25 29  ALT 11 11  ALKPHOS 113 143*  BILITOT 0.5 0.7  PROT 7.3 8.1  ALBUMIN 2.8* 3.3*   No results for input(s): "LIPASE", "AMYLASE" in the last 168 hours. No results for input(s): "AMMONIA" in the last 168 hours. Coagulation profile No results for input(s): "INR", "PROTIME" in the last 168 hours. COVID-19 Labs  Recent Labs    06/27/23 1206  FERRITIN 131  CRP 1.5*    Lab Results  Component Value Date   SARSCOV2NAA NEGATIVE 06/19/2023   SARSCOV2NAA NEGATIVE 06/11/2023   SARSCOV2NAA NEGATIVE 05/25/2023   SARSCOV2NAA NEGATIVE 07/18/2022     CBC: Recent Labs  Lab 06/22/23 0603 06/26/23 2053 06/27/23 1735  WBC 8.4 18.5* 9.0  NEUTROABS 5.8 11.9*  --   HGB 11.0* 11.4* 11.6*  HCT 35.0* 36.6* 35.8*  MCV 91.6 92.7 91.6  PLT 378 411* 354   Cardiac Enzymes: Recent Labs  Lab 06/27/23 1734  CKTOTAL 30*   BNP (last 3 results) No results for input(s): "PROBNP" in the last 8760 hours. CBG: No results for input(s): "GLUCAP" in the last 168 hours. D-Dimer: No results for input(s): "DDIMER" in the last 72 hours. Hgb A1c: No results for input(s): "HGBA1C" in the last 72 hours. Lipid Profile: No results for input(s): "CHOL", "HDL", "LDLCALC", "TRIG", "CHOLHDL", "LDLDIRECT" in the last 72 hours. Thyroid function studies: No results for input(s): "TSH", "T4TOTAL", "T3FREE", "THYROIDAB" in the last 72 hours.  Invalid input(s): "FREET3" Anemia work up: Recent Labs    06/27/23 1206  FERRITIN 131   Sepsis Labs: Recent Labs  Lab 06/22/23 0603 06/26/23 2053 06/26/23 2303 06/27/23 1735  WBC 8.4 18.5*  --  9.0  LATICACIDVEN  --   --  2.1*  --    Microbiology Recent Results (from the past 240 hour(s))  Resp panel by RT-PCR (RSV, Flu A&B, Covid) Anterior Nasal Swab     Status: None   Collection Time: 06/19/23  5:01 PM   Specimen: Anterior Nasal Swab  Result Value Ref Range Status   SARS Coronavirus 2 by RT PCR NEGATIVE NEGATIVE Final    Comment: (NOTE) SARS-CoV-2 target nucleic acids are NOT DETECTED.  The SARS-CoV-2 RNA is generally detectable in upper respiratory specimens during the acute phase of infection. The lowest concentration of SARS-CoV-2 viral copies this assay can detect is 138 copies/mL. A negative result does not preclude SARS-Cov-2 infection and should not be used as the sole basis for treatment or other patient management decisions. A negative result may occur with  improper specimen collection/handling, submission of specimen other than nasopharyngeal swab, presence of viral mutation(s) within  the areas targeted by this assay, and inadequate number of viral copies(<138 copies/mL). A negative result must be combined with clinical observations, patient history, and epidemiological information. The expected result is Negative.  Fact Sheet for Patients:  BloggerCourse.com  Fact  Sheet for Healthcare Providers:  SeriousBroker.it  This test is no t yet approved or cleared by the [place] FDA and  has been authorized for detection and/or diagnosis of SARS-CoV-2 by FDA under an Emergency Use Authorization (EUA). This EUA will remain  in effect (meaning this test can be used) for the duration of the COVID-19 declaration under Section 564(b)(1) of the Act, 21 U.S.C.section 360bbb-3(b)(1), unless the authorization is terminated  or revoked sooner.       Influenza A by PCR NEGATIVE NEGATIVE Final   Influenza B by PCR NEGATIVE NEGATIVE Final    Comment: (NOTE) The Xpert Xpress SARS-CoV-2/FLU/RSV plus assay is intended as an aid in the diagnosis of influenza from Nasopharyngeal swab specimens and should not be used as a sole basis for treatment. Nasal washings and aspirates are unacceptable for Xpert Xpress SARS-CoV-2/FLU/RSV testing.  Fact Sheet for Patients: BloggerCourse.com  Fact Sheet for Healthcare Providers: SeriousBroker.it  This test is not yet approved or cleared by the [place] FDA and has been authorized for detection and/or diagnosis of SARS-CoV-2 by FDA under an Emergency Use Authorization (EUA). This EUA will remain in effect (meaning this test can be used) for the duration of the COVID-19 declaration under Section 564(b)(1) of the Act, 21 U.S.C. section 360bbb-3(b)(1), unless the authorization is terminated or revoked.     Resp Syncytial Virus by PCR NEGATIVE NEGATIVE Final    Comment: (NOTE) Fact Sheet for  Patients: BloggerCourse.com  Fact Sheet for Healthcare Providers: SeriousBroker.it  This test is not yet approved or cleared by the [place] FDA and has been authorized for detection and/or diagnosis of SARS-CoV-2 by FDA under an Emergency Use Authorization (EUA). This EUA will remain in effect (meaning this test can be used) for the duration of the COVID-19 declaration under Section 564(b)(1) of the Act, 21 U.S.C. section 360bbb-3(b)(1), unless the authorization is terminated or revoked.  Performed at [facility], [address] [place], [place] [address]   Culture, blood (Routine X 2) w Reflex to ID Panel     Status: None   Collection Time: 06/20/23 12:30 AM   Specimen: BLOOD  Result Value Ref Range Status   Specimen Description   Final    BLOOD LEFT ANTECUBITAL Performed at [facility], [address] [place], [place] [address]    Special Requests   Final    BOTTLES DRAWN AEROBIC AND ANAEROBIC Blood Culture results may not be optimal due to an inadequate volume of blood received in culture bottles Performed at [facility], [address] [place], [place] [address]    Culture   Final    NO GROWTH 5 DAYS Performed at [facility], [address] [place], [place] [address]    Report Status 06/25/2023 FINAL  Final  Culture, blood (Routine X 2) w Reflex to ID Panel     Status: None   Collection Time: 06/20/23 12:35 AM   Specimen: BLOOD  Result Value Ref Range Status   Specimen Description   Final    BLOOD BLOOD LEFT FOREARM Performed at [facility], [address] [place], [place] [address]    Special Requests   Final    BOTTLES DRAWN AEROBIC AND ANAEROBIC Blood Culture results may not be optimal due to an inadequate volume of blood received in culture bottles Performed at [facility], [address],  [place], [place] [address]    Culture   Final  NO GROWTH 5 DAYS Performed at [facility], [address] [place], [place] [address]    Report Status 06/25/2023 FINAL  Final  MRSA Next Gen by PCR, Nasal     Status: None   Collection Time: 06/20/23  2:16 PM   Specimen: Nasal Mucosa; Nasal Swab  Result Value Ref Range Status   MRSA by PCR Next Gen NOT DETECTED NOT DETECTED Final    Comment: (NOTE) The GeneXpert MRSA Assay (FDA approved for NASAL specimens only), is one component of a comprehensive MRSA colonization surveillance program. It is not intended to diagnose MRSA infection nor to guide or monitor treatment for MRSA infections. Test performance is not FDA approved in patients less than 79 years old. Performed at [facility], [address] [place], [place] [address]   Blood culture (routine x 2)     Status: None (Preliminary result)   Collection Time: 06/26/23 11:03 PM   Specimen: BLOOD  Result Value Ref Range Status   Specimen Description   Final    BLOOD RIGHT ANTECUBITAL Performed at [facility], [address] [place], [place] [address]    Special Requests   Final    BOTTLES DRAWN AEROBIC AND ANAEROBIC Blood Culture adequate volume Performed at [facility], [address], [place], [place] [address]    Culture   Final    NO GROWTH 1 DAY Performed at [facility], [address] [place], [place] [address]    Report Status PENDING  Incomplete  Blood culture (routine x 2)     Status: None (Preliminary result)   Collection Time: 06/27/23 11:09 AM   Specimen: BLOOD LEFT ARM  Result Value Ref Range Status   Specimen Description   Final    BLOOD LEFT ARM Performed at [facility], [address] [place], [place] [address]    Special Requests   Final    BOTTLES DRAWN AEROBIC ONLY Blood Culture results may not be optimal due to an inadequate volume of blood received in culture  bottles Performed at [facility], [address], [place], [place] [address]    Culture   Final    NO GROWTH < 24 HOURS Performed at [facility], [address], [place], [place] [address]    Report Status PENDING  Incomplete     Medications:    atorvastatin  40 mg Oral Daily   carvedilol  3.125 mg Oral BID   furosemide  40 mg Oral Daily   guaiFENesin  600 mg Oral BID   ketorolac  15 mg Intravenous Q6H   nicotine  14 mg Transdermal Daily   sodium chloride flush  3 mL Intravenous Q12H   umeclidinium bromide  1 puff Inhalation Daily   Continuous Infusions:  ampicillin-sulbactam (UNASYN) IV 3 g (06/28/23 0820)      LOS: 1 day   Marinda Elk  [facility]  06/28/2023, 10:44 AM

## 2023-06-28 NOTE — Progress Notes (Signed)
Confusion this morning regarding bronchoscopy. Plans had been for bronch with BAL only due to plavix use through yesterday. I discussed with him that we have the option to do a BAL or if you would prefer to continue to hold Plavix we could do a more thorough bronchoscopy with transbronchial biopsies and possibly EBUS next week.  He elected to remain in the hospital waiting for this procedure.  He understands that without a diagnosis we will not be able to treat him has accurately or aggressively as if we had bronchial washings that were suggestive of an organism.  He elects to wait for next week.  Continue holding Plavix.  Okay to eat today. Case reuqested for bronch with fluoro on Tues.  Steffanie Dunn, DO 06/28/23 2:44 PM [facility]  For contact information, see Amion. If no response to pager, please call PCCM consult pager. After hours, 7PM- 7AM, please call Elink.

## 2023-06-28 NOTE — TOC Initial Note (Signed)
Transition of Care [facility]) - Initial/Assessment Note    Patient Details  Name: Craig Schmidt MRN: [id number] Date of Birth: [birth year]  Transition of Care [facility]) CM/SW Contact:    Otelia Santee, LCSW Phone Number: 06/28/2023, 1:29 PM  Clinical Narrative:                 Met with pt and significant other in room to discuss SDOH concerns and available resources. Pt currently houseless and has been sleeping on the streets. Pt declines shelter resources/placement as he states they are dangerous and he does not feel safe staying at them. Pt shares that he had a CM through the [name] however, due to them not having a phone the CM was unable to get a hold of them and closed their case. Pt states the [facility] has told him he is no longer eligible for CM services at the [facility] because of this. Pt is familiar with resources for food, toiletries, and clothing however, states that due to lack of transportation it is difficult for him to obtain these resources. Pt is able to ride the bus however, reports he does not have the finances to afford bus fare.  Pt reports receiving $60 in EBT benefits for food. Pt receives $943 a month in SSI.  Resources for substance use, transportation, and food have been placed on AVS. Referrals for housing case management, toiletries, and clothing have been made to community agencies through the [facility] 360 portal. TOC will continue to follow for discharge needs.   Expected Discharge Plan: Homeless Shelter Barriers to Discharge: No Barriers Identified   Patient Goals and CMS Choice Patient states their goals for this hospitalization and ongoing recovery are:: To get housing          Expected Discharge Plan and Services In-house Referral: Clinical Social Work Discharge Planning Services: NA Post Acute Care Choice: NA Living arrangements for the past 2 months: Homeless                 DME Arranged: N/A DME Agency: NA                  Prior Living  Arrangements/Services Living arrangements for the past 2 months: Homeless Lives with:: Significant Other Patient language and need for interpreter reviewed:: Yes Do you feel safe going back to the place where you live?: Yes      Need for Family Participation in Patient Care: No (Comment) Care giver support system in place?: No (comment) Current home services: DME ([employer]) Criminal Activity/Legal Involvement Pertinent to Current Situation/Hospitalization: No - Comment as needed  Activities of Daily Living   ADL Screening (condition at time of admission) Independently performs ADLs?: No Does the patient have a NEW difficulty with bathing/dressing/toileting/self-feeding that is expected to last >3 days?: Yes (Initiates electronic notice to provider for possible OT consult) Does the patient have a NEW difficulty with getting in/out of bed, walking, or climbing stairs that is expected to last >3 days?: Yes (Initiates electronic notice to provider for possible PT consult) Does the patient have a NEW difficulty with communication that is expected to last >3 days?: No Is the patient deaf or have difficulty hearing?: No Does the patient have difficulty seeing, even when wearing glasses/contacts?: No Does the patient have difficulty concentrating, remembering, or making decisions?: No  Permission Sought/Granted Permission sought to share information with : Other (comment), Family Supports ([facility]) Permission granted to share information with : Yes, Verbal Permission Granted  Emotional Assessment Appearance:: Appears older than stated age Attitude/Demeanor/Rapport: Engaged Affect (typically observed): Accepting Orientation: : Oriented to Self, Oriented to Place, Oriented to  Time, Oriented to Situation Alcohol / Substance Use: Alcohol Use, Illicit Drugs Psych Involvement: No (comment)  Admission diagnosis:  Multifocal pneumonia [J18.9] Patient Active Problem List    Diagnosis Date Noted   Coronary artery disease 06/27/2023   History of CVA (cerebrovascular accident) 06/27/2023   Substance use disorder 06/27/2023   Atypical chest pain 06/21/2023   Pulmonary infiltrate 06/21/2023   Multifocal pneumonia 06/11/2023   Hyperkalemia 06/11/2023   Hyponatremia 06/11/2023   Normocytic anemia 06/11/2023   Thrombocytosis 06/11/2023   Troponin I above reference range 05/25/2023   CAP (community acquired pneumonia) 05/25/2023   Pneumonia 05/25/2023   Chronic combined systolic and diastolic CHF (congestive heart failure) (HCC) 12/03/2022   Malnutrition of moderate degree 11/30/2022   Acute on chronic combined systolic and diastolic CHF (congestive heart failure) (HCC) 11/29/2022   Acute on chronic combined systolic (congestive) and diastolic (congestive) heart failure (HCC) 11/28/2022   ACS (acute coronary syndrome) (HCC) 11/28/2022   Back pain 11/28/2022   Essential hypertension 11/28/2022   Hyperlipidemia 11/28/2022   Noncompliance with medication regimen 11/28/2022   ST elevation myocardial infarction (STEMI) of inferolateral wall, subsequent episode of care (HCC) 12/19/2021   ST elevation myocardial infarction (STEMI) (HCC)    Ischemic cardiomyopathy    Cocaine abuse (HCC)    Smoker    Alcohol abuse    CVA (cerebral vascular accident) (HCC) 09/08/2021   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 [place]  [place] [address] Phone: [phone] Fax: [phone]  [id number] [place], [place] - [address] [place] [address] Phone: [phone] Fax: [phone]  [facility] - [place], [place] - [address] [place] [address] Phone: [phone] Fax: [phone]  [facility] [address] [place] [address] Phone: [phone] Fax: [phone]     Social Determinants of Health (SDOH) Social History: SDOH Screenings   Food Insecurity: Food Insecurity Present (06/27/2023)  Housing: High Risk (06/27/2023)  Transportation Needs: Unmet Transportation Needs (06/27/2023)  Utilities: At Risk (06/27/2023)  Alcohol Screen: High Risk (09/19/2021)  Depression (PHQ2-9): High Risk (06/13/2022)  Financial Resource Strain: High Risk (09/07/2022)  Stress: Stress Concern Present (10/16/2022)  Tobacco Use: High Risk (06/26/2023)   SDOH Interventions:     Readmission Risk Interventions    06/28/2023    1:27 PM 06/21/2023   12:50 PM  Readmission Risk Prevention Plan  Transportation Screening Complete Complete  PCP or Specialist Appt within 3-5 Days Complete   HRI or Home Care Consult Complete   Social Work Consult for Recovery Care Planning/Counseling Complete   Palliative Care Screening Not Applicable   Medication Review Oceanographer) Complete Complete  PCP or Specialist appointment within 3-5 days of discharge  Complete  HRI or Home Care  Consult  Complete  SW Recovery Care/Counseling Consult  Complete  Palliative Care Screening  Not Applicable  Skilled Nursing Facility  Not Applicable

## 2023-06-28 NOTE — Evaluation (Signed)
Physical Therapy Evaluation Patient Details Name: Craig Schmidt MRN: [id number] DOB: May 13, 1971 Today's Date: 06/28/2023  History of Present Illness  Pt is a 52 y.o. male admitted for L flank pain, multifocal pneumonia & SOB on 06/26/2023. Chest CT revealing necrotic mass like consolidations, mediastinal adenopathy and pt scheduled for biopsy 12/5. Recent hospitalization 11/24- 11/30 as well as pt leaving AMA on 11/08, readmitted on 11/12 and discharged 11/18. PMH significant for homelessness, CVA with residual R sided hemiparesis, cocaine abuse, alcohol abuse, medication non-adherence, CAD, cardiomyopathy, NSTEMI, HFrEF, sepsis, hyperlipidemia, CKD.  Clinical Impression   Pt admitted with above diagnosis. Pt currently with functional limitations due to the deficits listed below (see PT Problem List). Pt in bed when PT arrived. Pt agreeable to therapy intervention, friend/significant other present. Pt required S and increased time for bed mobility, CGA for transfer tasks, F static standing with SPC in L UE, gait assessed with personal SPC 25 feet in personal room with CGA and noted R LE weakness attributed to late effects CVA impacting gait pattern and stability with slow cadence. Pt limited by fatigue. Pt left seated in recliner and all needs in place. PT eval 06/13/2023 pt ambulatory 160 feet with rollaor.  Pt will benefit from acute skilled PT to increase their independence and safety with mobility to allow discharge.         If plan is discharge home, recommend the following: Assistance with cooking/housework;Assist for transportation;Help with stairs or ramp for entrance;A little help with walking and/or transfers;A little help with bathing/dressing/bathroom   Can travel by private vehicle   Yes    Equipment Recommendations None recommended by PT  Recommendations for Other Services       Functional Status Assessment Patient has had a recent decline in their functional status and  demonstrates the ability to make significant improvements in function in a reasonable and predictable amount of time.     Precautions / Restrictions Precautions Precautions: Fall Precaution Comments: Baseline R hemi. R shoulder subluxation - support UE as able reports no longer having sling Restrictions Weight Bearing Restrictions: No      Mobility  Bed Mobility Overal bed mobility: Needs Assistance Bed Mobility: Supine to Sit, Rolling Rolling: Supervision   Supine to sit: Supervision, HOB elevated     General bed mobility comments: increased time and min cues    Transfers Overall transfer level: Needs assistance Equipment used: Straight cane Transfers: Sit to/from Stand, Bed to chair/wheelchair/BSC Sit to Stand: Contact guard assist           General transfer comment: able to stand from EOB with CGA/supervision    Ambulation/Gait Ambulation/Gait assistance: Contact guard assist Gait Distance (Feet): 25 Feet Assistive device: Straight cane Gait Pattern/deviations: Trunk flexed, Step-through pattern, Decreased stride length, Decreased stance time - right, Decreased dorsiflexion - right, Decreased weight shift to right, Shuffle Gait velocity: decreased     General Gait Details: pt has personal SPC, does not appear to be appropriate height. pt indicates that he is used to his routines and has friend/significant other that assists him as needed with uneven surfaces with use of SPC for gait tasks. pt R UE deficits limit pt from grasping RW or rollator at time of eval. pt has occational R foot drop and drag with use of trunk extension to advace. pt amb with B toe out and limited B knee flexion in swing phase.  Stairs            Psychologist, prison and probation services  Tilt Bed    Modified Rankin (Stroke Patients Only)       Balance Overall balance assessment: Needs assistance (pt denies falls) Sitting-balance support: Feet supported, No upper extremity supported Sitting  balance-Leahy Scale: Good Sitting balance - Comments: trunk and head flexion in sitting with rounded shoulders, pt reports he has had this posture since childhood due to asthma.   Standing balance support: Single extremity supported, During functional activity, Reliant on assistive device for balance Standing balance-Leahy Scale: Fair Standing balance comment: static standing fair                             Pertinent Vitals/Pain Pain Assessment Pain Assessment: No/denies pain    Home Living Family/patient expects to be discharged to:: Shelter/Homeless   Available Help at Discharge: Friend(s);Available 24 hours/day (Male friend is with him 24/7) Type of Home: Homeless                  Prior Function Prior Level of Function : Needs assist             Mobility Comments: uses SPC. Male friend reports that she has to help step his right foot forward at times when he is unable to espically with curbs. ADLs Comments: Male friend assists him with all ADL tasks at baseline since CVA Feb/March 2023 and has residual R side weakness     Extremity/Trunk Assessment   Upper Extremity Assessment Upper Extremity Assessment: Right hand dominant;Defer to OT evaluation    Lower Extremity Assessment Lower Extremity Assessment: RLE deficits/detail RLE Deficits / Details: late effects CVA; ankle DF 4/5, PF 3+/5, knee extension 2+/5 and hip flexion 1/5 seated with palpatable mm contraction, pt appears heasitant to activily engage with MMT. pt demonstrated R hip flexion in standing RLE Sensation: WNL RLE Coordination: decreased gross motor    Cervical / Trunk Assessment Cervical / Trunk Assessment: Kyphotic;Other exceptions Cervical / Trunk Exceptions: forward head, rounded shoulders, posterior pelvic tilt  Communication   Communication Communication: No apparent difficulties Cueing Techniques: Verbal cues  Cognition Arousal: Alert Behavior During Therapy: WFL for  tasks assessed/performed Overall Cognitive Status: Within Functional Limits for tasks assessed Area of Impairment: Safety/judgement, Problem solving                               General Comments: pt able to follow multi step directions        General Comments General comments (skin integrity, edema, etc.): No edema in BLE noted.    Exercises     Assessment/Plan    PT Assessment Patient needs continued PT services  PT Problem List Decreased strength;Decreased balance;Decreased activity tolerance;Decreased mobility;Decreased knowledge of use of DME;Cardiopulmonary status limiting activity;Decreased range of motion;Decreased coordination       PT Treatment Interventions DME instruction;Gait training;Stair training;Functional mobility training;Therapeutic activities;Balance training;Neuromuscular re-education;Patient/family education;Therapeutic exercise    PT Goals (Current goals can be found in the Care Plan section)  Acute Rehab PT Goals Patient Stated Goal: to get better PT Goal Formulation: With patient Time For Goal Achievement: 07/12/23 Potential to Achieve Goals: Fair    Frequency Min 1X/week     Co-evaluation PT/OT/SLP Co-Evaluation/Treatment: Yes Reason for Co-Treatment: To address functional/ADL transfers PT goals addressed during session: Mobility/safety with mobility;Balance;Proper use of DME OT goals addressed during session: ADL's and self-care;Proper use of Adaptive equipment and DME;Strengthening/ROM       AM-PAC  PT "6 Clicks" Mobility  Outcome Measure Help needed turning from your back to your side while in a flat bed without using bedrails?: A Little Help needed moving from lying on your back to sitting on the side of a flat bed without using bedrails?: A Little Help needed moving to and from a bed to a chair (including a wheelchair)?: A Little Help needed standing up from a chair using your arms (e.g., wheelchair or bedside chair)?: A  Little Help needed to walk in hospital room?: A Little Help needed climbing 3-5 steps with a railing? : A Lot 6 Click Score: 17    End of Session Equipment Utilized During Treatment: Gait belt Activity Tolerance: Patient limited by fatigue Patient left: with call bell/phone within reach;in chair;with family/visitor present Nurse Communication: Mobility status PT Visit Diagnosis: Difficulty in walking, not elsewhere classified (R26.2);Muscle weakness (generalized) (M62.81);Unsteadiness on feet (R26.81)    Time: 2956-2130 PT Time Calculation (min) (ACUTE ONLY): 30 min   Charges:   PT Evaluation $PT Eval Low Complexity: 1 Low PT Treatments $Gait Training: 8-22 mins PT General Charges $$ ACUTE PT VISIT: 1 Visit         Johnny Bridge, PT Acute Rehab   Jacqualyn Posey 06/28/2023, 11:04 AM

## 2023-06-29 DIAGNOSIS — J189 Pneumonia, unspecified organism: Secondary | ICD-10-CM | POA: Diagnosis not present

## 2023-06-29 NOTE — Progress Notes (Signed)
[facility] PROGRESS NOTE    Progress Note  Craig Schmidt  UEA:[id number] DOB: [birth year] DOA: 06/26/2023 PCP: Rema Fendt, NP     Brief Narrative:   Craig Schmidt is an 52 y.o. 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   Assessment/Plan:   Persistent multifocal lung infiltrates: Continue IV antibiotics, IV Unasyn. Blood cultures have been negative till date. Pulmonary defer bronchoscopy for next week. Rheumatology panel serum QuantiFERON has been sent. 2D echo showed an EF of less than 20% no wall motion abnormality and grade 2 diastolic dysfunction, moderate mitral stenosis and aortic valve regurgitation no evidence of endocarditis  Chronic combined systolic and diastolic CHF (congestive heart failure): Last EF of 20% appears euvolemic on admission. Continue Coreg and Lasix monitor strict I's and O's and daily weights restrict fluids.  Coronary artery disease Asymptomatic continue  statins. Hold Plavix.  History of CVA: Continue statins. Hold Plavix for bronchoscopy.  Essential hypertension: Continue Coreg and Lasix.  Asthma: Continue inhalers.  Hyperlipidemia: Continue statins.  Polysubstance abuse: Patient reports ongoing crack use.  DVT prophylaxis: lovenox Family Communication:none Status is: Observation The patient will require care spanning > 2 midnights and should be moved to inpatient because: Multifocal pneumonia    Code Status:     Code Status Orders  (From admission, onward)           Start     Ordered   06/27/23 0054  Full code  Continuous        Question:  By:  Answer:  Consent: discussion documented in EHR   06/27/23 0055           Code Status History     Date Active Date Inactive Code Status Order ID Comments User Context   06/19/2023 2315 06/22/2023 2017 Full Code [id number]  Steffanie Rainwater, MD ED   06/11/2023 0225 06/14/2023 0010 Full Code [id number]  Howerter, Chaney Born, DO ED   06/04/2023 0810 06/10/2023 1739 Full Code [id number]  Lorin Glass, MD ED   05/25/2023 1820 05/31/2023 2048 Full Code [id number]  Nolberto Hanlon, MD ED   11/28/2022 1620 12/05/2022 2107 Full Code [id number]  Jonah Blue, MD ED   04/13/2022 0842 04/13/2022 1705 Full Code [id number]  Laurey Morale, MD Inpatient   12/19/2021 1909 12/27/2021 2321 Full Code [id number]  Lyn Records, MD Inpatient   09/08/2021 0931 10/03/2021 2351 Full Code [id number]  Milon Dikes, MD ED   05/14/2015 1117 05/18/2015 1701 Full Code [id number]  Dorothea Ogle, MD ED         IV Access:   Peripheral IV   Procedures and diagnostic studies:   DG Swallowing Func-Speech Pathology  Result Date: 06/27/2023 Table formatting from the original result was not included. Modified Barium Swallow Study Patient Details Name: Craig Schmidt MRN: [id number] Date of Birth: 01/31/1971 Today's Date: 06/27/2023 HPI/PMH: HPI: 52 year old homeless man with addiction to cigarettes and crack cocaine presenting with >1 month dry cough, chills, pleurisy with background of 6 months unintentional weight loss.  Per chart pt has been to the hospital 5 times for this issue  since 05/25/23. CXR Mild bibasilar infiltrates. PMH: stroke 2023, CAD, CKD, brain tumor, ETOH abuse. BSE 11/30/22 no signs of aspiration in reclined position due to pain, denied dysphagia and unlikely to suffer from silent aspiration and denied reflux or regurgitation. Recommended to continue regular diet, thin liquids. MBS to assess oropharyngeal swallow given multiple admissions for pna over past year, history of stroke and pharyngeal  globus sensation. Clinical Impression: Clinical Impression: Pt demonstrated a normal oropharyngeal sequence with adequate oral control, bolus cohesion, mastication and complete oral clearance. His laryngeal ROM and strength were within normal range for hyolaryngeal excursion, epiglottic inversion and laryngeal closure which prevented penetration and aspiration. Swallow integrity was challenged with multiple/sequential cup sips with thin and use of straw. Pt reported pharyngeal globus sensation although there was no pharyngeal residue observed and scan of esophagus revealed complete clearance. Pt has history of ETOH abuse and therapist suspects he may have occasional esophageal irritation or undiagnosed involvement contributing to pharyngeal globus sensation. Recommend he continue regular texture, thin liquids, pills with thin and no further ST is needed at this time. Factors that may increase risk of adverse event in presence of aspiration Rubye Oaks & Clearance Coots 2021): No data recorded Recommendations/Plan: Swallowing Evaluation Recommendations Swallowing Evaluation Recommendations Recommendations: PO diet PO Diet Recommendation: Regular; Thin liquids (Level 0) Liquid Administration via: Cup; Straw Medication Administration: Whole meds with liquid Supervision: Staff to assist with self-feeding; Other (comment) (limited use of right hand after stroke) Postural changes: Position pt fully upright for meals Oral care recommendations: Oral care BID (2x/day) Treatment Plan Treatment Plan Treatment recommendations: No treatment recommended at this time Follow-up recommendations: No SLP follow up Functional status assessment: Patient has not had a recent decline in their functional status. Recommendations Recommendations for follow up therapy are one component of a multi-disciplinary discharge planning process, led by the attending physician.  Recommendations may be updated based on patient status, additional functional criteria  and insurance authorization. Assessment: Orofacial Exam: Orofacial Exam Oral Cavity: Oral Hygiene: WFL Oral Cavity - Dentition: Other (Comment) (missing 1-2 upper but majority intact) Orofacial Anatomy: WFL Oral Motor/Sensory Function: WFL Anatomy: Anatomy: WFL Boluses Administered: Boluses Administered Boluses Administered: Thin liquids (Level 0); Mildly thick liquids (Level 2, nectar thick); Moderately thick liquids (Level 3, honey thick); Puree; Solid  Oral Impairment Domain: Oral Impairment Domain Lip Closure: No labial escape Tongue control during bolus hold: Cohesive bolus between tongue to palatal seal Bolus preparation/mastication: Timely and efficient chewing and mashing Bolus transport/lingual motion: Brisk tongue motion Oral residue: Complete oral clearance Location of oral residue : N/A Initiation of pharyngeal swallow : Valleculae  Pharyngeal Impairment Domain: Pharyngeal Impairment Domain Soft palate elevation: No bolus between soft palate (SP)/pharyngeal wall (PW) Laryngeal elevation: Complete superior movement of thyroid cartilage with complete approximation of arytenoids to epiglottic petiole Anterior hyoid excursion: Complete anterior movement Epiglottic movement: Complete inversion Laryngeal vestibule closure: Complete, no air/contrast in laryngeal vestibule Pharyngeal stripping wave : Present - complete Pharyngeal contraction (A/P view only): N/A Pharyngoesophageal segment opening: Complete distension and complete duration, no obstruction of flow Tongue base retraction: No contrast between tongue base and posterior pharyngeal wall (PPW) Pharyngeal residue: Complete pharyngeal clearance Location of pharyngeal residue: N/A  Esophageal Impairment Domain: Esophageal Impairment Domain Esophageal clearance upright position: Complete clearance, esophageal coating Pill: No data recorded Penetration/Aspiration Scale Score: Penetration/Aspiration Scale Score 1.  Material does not enter airway: Thin liquids  (Level 0); Mildly thick liquids (Level 2, nectar thick); Moderately thick liquids (Level 3, honey thick); Puree; Solid  Compensatory Strategies: Compensatory Strategies Compensatory strategies: -- (N/A)   General Information: Caregiver present: No  Diet Prior to this Study: Regular; Thin liquids (Level 0)   Temperature : Normal   Respiratory Status: WFL   Supplemental O2: None (Room air)   History of Recent Intubation: No  Behavior/Cognition: Alert; Cooperative; Pleasant mood Self-Feeding Abilities: Able to self-feed; Other (Comment) (held cup with left hand) Baseline vocal quality/speech: Normal Volitional Cough: Able to elicit Volitional Swallow: Able to elicit Exam Limitations: No limitations Goal Planning: No data recorded No data recorded No data recorded No data recorded Consulted and agree with results and recommendations: Patient; Nurse; Physician Pain: Pain Assessment Pain Assessment: Faces Faces Pain Scale: 0 Breathing: 0 Negative Vocalization: 0 Facial Expression: 0 Body Language: 0 Consolability: 0 PAINAD Score: 0 Pain Location: "around ribs when he coughs" End of Session: Start Time:SLP Start Time (ACUTE ONLY): 1443 Stop Time: SLP Stop Time (ACUTE ONLY): 1455 Time Calculation:SLP Time Calculation (min) (ACUTE ONLY): 12 min Charges: SLP Evaluations $ SLP Speech Visit: 1 Visit SLP Evaluations $BSS Swallow: 1 Procedure $MBS Swallow: 1 Procedure SLP visit diagnosis: SLP Visit Diagnosis: Dysphagia, unspecified (R13.10) Past Medical History: Past Medical History: Diagnosis Date  Alcohol abuse   Asthma   Brain tumor (HCC)   CAD (coronary artery disease)   Chronic HFrEF (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)   Chronic kidney disease, stage 3 (HCC)   Cocaine abuse (HCC)   History of medication noncompliance   Homelessness   MI (myocardial infarction) (HCC)   STEMI (ST elevation myocardial infarction) (HCC)   Stroke [facility])  Past Surgical History: Past Surgical History: Procedure Laterality Date   BUBBLE STUDY  09/11/2021  Procedure: BUBBLE STUDY;  Surgeon: Pricilla Riffle, MD;  Location: [facility] ENDOSCOPY;  Service: Cardiovascular;;  LEFT HEART CATH AND CORONARY ANGIOGRAPHY N/A 12/19/2021  Procedure: LEFT HEART CATH AND CORONARY ANGIOGRAPHY;  Surgeon: Lyn Records, MD;  Location: [facility] INVASIVE CV LAB;  Service: Cardiovascular;  Laterality: N/A;  RIGHT/LEFT HEART CATH AND CORONARY ANGIOGRAPHY N/A 04/13/2022  Procedure: RIGHT/LEFT HEART CATH AND CORONARY ANGIOGRAPHY;  Surgeon: Laurey Morale, MD;  Location: [facility] INVASIVE CV LAB;  Service: Cardiovascular;  Laterality: N/A;  TEE WITHOUT CARDIOVERSION N/A 09/11/2021  Procedure: TRANSESOPHAGEAL ECHOCARDIOGRAM (TEE);  Surgeon: Pricilla Riffle, MD;  Location: [facility] ENDOSCOPY;  Service: Cardiovascular;  Laterality: N/A; Royce Macadamia 06/27/2023, 3:32 PM  ECHOCARDIOGRAM COMPLETE  Result Date: 06/27/2023    ECHOCARDIOGRAM REPORT   Patient Name:   Craig Schmidt Date of Exam: 06/27/2023 Medical Rec #:  [id number]          Height:       67.0 in Accession #:    [id number]         Weight:       137.3 lb Date of Birth:  September 24, 1970           BSA:          1.724 m Patient Age:    52 years           BP:           130/93 mmHg Patient Gender: M                  HR:           93 bpm. Exam Location:  Inpatient Procedure: 2D Echo, Cardiac Doppler and Color Doppler Indications:    Endocarditits I38  History:        Patient has  prior history of Echocardiogram examinations, most                 recent 05/27/2023. CHF and Cardiomyopathy, CAD and Acute MI,                 Stroke; Risk Factors:Hypertension, Current Smoker and                 Dyslipidemia.  Sonographer:    Lucendia Herrlich RCS Referring Phys: [id number] Vinnie Level SMITH IMPRESSIONS  1. Left ventricular ejection fraction, by estimation, is <20%. The left ventricle has severely decreased function. The left ventricle demonstrates global hypokinesis. The left ventricular internal cavity size was mildly to moderately dilated. Left  ventricular diastolic parameters are consistent with Grade II diastolic dysfunction (pseudonormalization).  2. Right ventricular systolic function is moderately reduced. The right ventricular size is mildly enlarged.  3. Left atrial size was severely dilated.  4. Right atrial size was mildly dilated.  5. The mitral valve is grossly normal. There is mild thickening of the mitral valve with restricted excursion of valve leaflets. Moderate mitral stenosis.  6. The aortic valve is normal in structure. Aortic valve regurgitation is trivial.  7. No overt signs of endocarditis.  8. The inferior vena cava is normal in size with greater than 50% respiratory variability, suggesting right atrial pressure of 3 mmHg. FINDINGS  Left Ventricle: Left ventricular ejection fraction, by estimation, is <20%. The left ventricle has severely decreased function. The left ventricle demonstrates global hypokinesis. The left ventricular internal cavity size was mildly to moderately dilated. There is no left ventricular hypertrophy. Left ventricular diastolic parameters are consistent with Grade II diastolic dysfunction (pseudonormalization). Right Ventricle: The right ventricular size is mildly enlarged. No increase in right ventricular wall thickness. Right ventricular systolic function is moderately reduced. Left Atrium: Left atrial size was severely dilated. Right Atrium: Right atrial size was mildly dilated. Pericardium: There is no evidence of pericardial effusion. Mitral Valve: The mitral valve is normal in structure. There is mild thickening of the mitral valve leaflet(s). Mild mitral valve regurgitation. Tricuspid Valve: The tricuspid valve is normal in structure. Tricuspid valve regurgitation is trivial. Aortic Valve: The aortic valve is normal in structure. Aortic valve regurgitation is trivial. Aortic valve peak gradient measures 4.8 mmHg. Pulmonic Valve: The pulmonic valve was normal in structure. Pulmonic valve regurgitation is  trivial. Aorta: The aortic root and ascending aorta are structurally normal, with no evidence of dilitation. Venous: The inferior vena cava is normal in size with greater than 50% respiratory variability, suggesting right atrial pressure of 3 mmHg. IAS/Shunts: No atrial level shunt detected by color flow Doppler.  LEFT VENTRICLE PLAX 2D LVIDd:         5.70 cm      Diastology LVIDs:         5.10 cm      LV e' medial:    5.27 cm/s LV PW:         0.90 cm      LV E/e' medial:  31.7 LV IVS:        0.70 cm      LV e' lateral:   5.27 cm/s LVOT diam:     2.00 cm      LV E/e' lateral: 31.7 LV SV:         35 LV SV Index:   20 LVOT Area:     3.14 cm  3D Volume EF: LV Volumes (MOD)            3D EF:        24 % LV vol d, MOD A2C: 190.0 ml LV EDV:       251 ml LV vol d, MOD A4C: 181.0 ml LV ESV:       190 ml LV vol s, MOD A2C: 159.0 ml LV SV:        61 ml LV vol s, MOD A4C: 141.0 ml LV SV MOD A2C:     31.0 ml LV SV MOD A4C:     181.0 ml LV SV MOD BP:      36.9 ml RIGHT VENTRICLE            IVC RV S prime:     7.23 cm/s  IVC diam: 1.80 cm TAPSE (M-mode): 1.2 cm LEFT ATRIUM             Index        RIGHT ATRIUM           Index LA diam:        4.90 cm 2.84 cm/m   RA Area:     16.10 cm LA Vol (A2C):   82.1 ml 47.63 ml/m  RA Volume:   43.90 ml  25.47 ml/m LA Vol (A4C):   86.1 ml 49.95 ml/m LA Biplane Vol: 92.9 ml 53.89 ml/m  AORTIC VALVE                 PULMONIC VALVE AV Area (Vmax): 2.16 cm     PR End Diast Vel: 8.07 msec AV Vmax:        110.00 cm/s AV Peak Grad:   4.8 mmHg LVOT Vmax:      75.50 cm/s LVOT Vmean:     47.000 cm/s LVOT VTI:       0.112 m  AORTA Ao Root diam: 3.10 cm Ao Asc diam:  2.50 cm MITRAL VALVE                TRICUSPID VALVE MV Area (PHT): 4.29 cm     TR Peak grad:   45.2 mmHg MV Decel Time: 177 msec     TR Vmax:        336.00 cm/s MR Peak grad: 101.6 mmHg MR Vmax:      504.00 cm/s   SHUNTS MV E velocity: 167.00 cm/s  Systemic VTI:  0.11 m MV A velocity: 119.00 cm/s  Systemic  Diam: 2.00 cm MV E/A ratio:  1.40 Aditya Sabharwal Electronically signed by Dorthula Nettles Signature Date/Time: 06/27/2023/1:34:57 PM    Final      Medical Consultants:   None.   Subjective:    Craig Schmidt he is better, he relates his breathing is better.  Objective:    Vitals:   06/28/23 1437 06/28/23 2029 06/29/23 0605 06/29/23 0954  BP: 121/85 121/85 (!) 123/92 (!) 134/98  Pulse: 78 86 92 90  Resp: 19 16 16    Temp: 97.6 F (36.4 C) 97.6 F (36.4 C) 98.4 F (36.9 C)   TempSrc: Oral Oral Oral   SpO2: 100% 100% 100%   Weight:      Height:       SpO2: 100 %   Intake/Output Summary (Last 24 hours) at 06/29/2023 0955 Last data filed at 06/29/2023 1610 Gross per 24 hour  Intake 626.35 ml  Output --  Net 626.35 ml   Filed Weights   06/28/23 0500  Weight:  62.1 kg    Exam: General exam: In no acute distress. Respiratory system: Good air movement and clear to auscultation. Cardiovascular system: S1 & S2 heard, RRR. No JVD. Gastrointestinal system: Abdomen is nondistended, soft and nontender.  Extremities: No pedal edema. Skin: No rashes, lesions or ulcers Psychiatry: Judgement and insight appear normal. Mood & affect appropriate. Data Reviewed:    Labs: Basic Metabolic Panel: Recent Labs  Lab 06/26/23 2053 06/27/23 1109  NA 138 136  K 4.7 4.5  CL 103 102  CO2 21* 22  GLUCOSE 91 101*  BUN 27* 26*  CREATININE 1.29* 1.32*  CALCIUM 9.4 9.2   GFR Estimated Creatinine Clearance: 58.8 mL/min (A) (by C-G formula based on SCr of 1.32 mg/dL (H)). Liver Function Tests: Recent Labs  Lab 06/26/23 2053  AST 29  ALT 11  ALKPHOS 143*  BILITOT 0.7  PROT 8.1  ALBUMIN 3.3*   No results for input(s): "LIPASE", "AMYLASE" in the last 168 hours. No results for input(s): "AMMONIA" in the last 168 hours. Coagulation profile No results for input(s): "INR", "PROTIME" in the last 168 hours. COVID-19 Labs  Recent Labs    06/27/23 1206  FERRITIN 131  CRP  1.5*    Lab Results  Component Value Date   SARSCOV2NAA NEGATIVE 06/19/2023   SARSCOV2NAA NEGATIVE 06/11/2023   SARSCOV2NAA NEGATIVE 05/25/2023   SARSCOV2NAA NEGATIVE 07/18/2022    CBC: Recent Labs  Lab 06/26/23 2053 06/27/23 1735  WBC 18.5* 9.0  NEUTROABS 11.9*  --   HGB 11.4* 11.6*  HCT 36.6* 35.8*  MCV 92.7 91.6  PLT 411* 354   Cardiac Enzymes: Recent Labs  Lab 06/27/23 1734  CKTOTAL 30*   BNP (last 3 results) No results for input(s): "PROBNP" in the last 8760 hours. CBG: No results for input(s): "GLUCAP" in the last 168 hours. D-Dimer: No results for input(s): "DDIMER" in the last 72 hours. Hgb A1c: No results for input(s): "HGBA1C" in the last 72 hours. Lipid Profile: No results for input(s): "CHOL", "HDL", "LDLCALC", "TRIG", "CHOLHDL", "LDLDIRECT" in the last 72 hours. Thyroid function studies: No results for input(s): "TSH", "T4TOTAL", "T3FREE", "THYROIDAB" in the last 72 hours.  Invalid input(s): "FREET3" Anemia work up: Recent Labs    06/27/23 1206  FERRITIN 131   Sepsis Labs: Recent Labs  Lab 06/26/23 2053 06/26/23 2303 06/27/23 1735  WBC 18.5*  --  9.0  LATICACIDVEN  --  2.1*  --    Microbiology Recent Results (from the past 240 hour(s))  Resp panel by RT-PCR (RSV, Flu A&B, Covid) Anterior Nasal Swab     Status: None   Collection Time: 06/19/23  5:01 PM   Specimen: Anterior Nasal Swab  Result Value Ref Range Status   SARS Coronavirus 2 by RT PCR NEGATIVE NEGATIVE Final    Comment: (NOTE) SARS-CoV-2 target nucleic acids are NOT DETECTED.  The SARS-CoV-2 RNA is generally detectable in upper respiratory specimens during the acute phase of infection. The lowest concentration of SARS-CoV-2 viral copies this assay can detect is 138 copies/mL. A negative result does not preclude SARS-Cov-2 infection and should not be used as the sole basis for treatment or other patient management decisions. A negative result may occur with  improper  specimen collection/handling, submission of specimen other than nasopharyngeal swab, presence of viral mutation(s) within the areas targeted by this assay, and inadequate number of viral copies(<138 copies/mL). A negative result must be combined with clinical observations, patient history, and epidemiological information. The expected result is Negative.  Fact  Sheet for Patients:  BloggerCourse.com  Fact Sheet for Healthcare Providers:  SeriousBroker.it  This test is no t yet approved or cleared by the [place] FDA and  has been authorized for detection and/or diagnosis of SARS-CoV-2 by FDA under an Emergency Use Authorization (EUA). This EUA will remain  in effect (meaning this test can be used) for the duration of the COVID-19 declaration under Section 564(b)(1) of the Act, 21 U.S.C.section 360bbb-3(b)(1), unless the authorization is terminated  or revoked sooner.       Influenza A by PCR NEGATIVE NEGATIVE Final   Influenza B by PCR NEGATIVE NEGATIVE Final    Comment: (NOTE) The Xpert Xpress SARS-CoV-2/FLU/RSV plus assay is intended as an aid in the diagnosis of influenza from Nasopharyngeal swab specimens and should not be used as a sole basis for treatment. Nasal washings and aspirates are unacceptable for Xpert Xpress SARS-CoV-2/FLU/RSV testing.  Fact Sheet for Patients: BloggerCourse.com  Fact Sheet for Healthcare Providers: SeriousBroker.it  This test is not yet approved or cleared by the [place] FDA and has been authorized for detection and/or diagnosis of SARS-CoV-2 by FDA under an Emergency Use Authorization (EUA). This EUA will remain in effect (meaning this test can be used) for the duration of the COVID-19 declaration under Section 564(b)(1) of the Act, 21 U.S.C. section 360bbb-3(b)(1), unless the authorization is terminated or revoked.     Resp  Syncytial Virus by PCR NEGATIVE NEGATIVE Final    Comment: (NOTE) Fact Sheet for Patients: BloggerCourse.com  Fact Sheet for Healthcare Providers: SeriousBroker.it  This test is not yet approved or cleared by the [place] FDA and has been authorized for detection and/or diagnosis of SARS-CoV-2 by FDA under an Emergency Use Authorization (EUA). This EUA will remain in effect (meaning this test can be used) for the duration of the COVID-19 declaration under Section 564(b)(1) of the Act, 21 U.S.C. section 360bbb-3(b)(1), unless the authorization is terminated or revoked.  Performed at [facility], [address], [place], [place] [address]   Culture, blood (Routine X 2) w Reflex to ID Panel     Status: None   Collection Time: 06/20/23 12:30 AM   Specimen: BLOOD  Result Value Ref Range Status   Specimen Description   Final    BLOOD LEFT ANTECUBITAL Performed at [facility], [address] [place], [place] [address]    Special Requests   Final    BOTTLES DRAWN AEROBIC AND ANAEROBIC Blood Culture results may not be optimal due to an inadequate volume of blood received in culture bottles Performed at [facility], [address], [place], [place] [address]    Culture   Final    NO GROWTH 5 DAYS Performed at [facility], [address], [place], [place] [address]    Report Status 06/25/2023 FINAL  Final  Culture, blood (Routine X 2) w Reflex to ID Panel     Status: None   Collection Time: 06/20/23 12:35 AM   Specimen: BLOOD  Result Value Ref Range Status   Specimen Description   Final    BLOOD BLOOD LEFT FOREARM Performed at [facility], [address] [place], [place] [address]    Special Requests   Final    BOTTLES DRAWN AEROBIC AND ANAEROBIC Blood Culture results may not be optimal due to an inadequate volume of blood received in culture  bottles Performed at [facility], [address], [place], [place] [address]  Culture   Final    NO GROWTH 5 DAYS Performed at [facility], [address] [place], [place] [address]    Report Status 06/25/2023 FINAL  Final  MRSA Next Gen by PCR, Nasal     Status: None   Collection Time: 06/20/23  2:16 PM   Specimen: Nasal Mucosa; Nasal Swab  Result Value Ref Range Status   MRSA by PCR Next Gen NOT DETECTED NOT DETECTED Final    Comment: (NOTE) The GeneXpert MRSA Assay (FDA approved for NASAL specimens only), is one component of a comprehensive MRSA colonization surveillance program. It is not intended to diagnose MRSA infection nor to guide or monitor treatment for MRSA infections. Test performance is not FDA approved in patients less than 61 years old. Performed at [facility], [address] [address] [place], [place] [address]   Blood culture (routine x 2)     Status: None (Preliminary result)   Collection Time: 06/26/23 11:03 PM   Specimen: BLOOD  Result Value Ref Range Status   Specimen Description   Final    BLOOD RIGHT ANTECUBITAL Performed at [facility], [address], [place], [place] [address]    Special Requests   Final    BOTTLES DRAWN AEROBIC AND ANAEROBIC Blood Culture adequate volume Performed at [facility], [address], [place], [place] [address]    Culture   Final    NO GROWTH 2 DAYS Performed at [facility], [address] [place], [place] [address]    Report Status PENDING  Incomplete  Blood culture (routine x 2)     Status: None (Preliminary result)   Collection Time: 06/27/23 11:09 AM   Specimen: BLOOD LEFT ARM  Result Value Ref Range Status   Specimen Description   Final    BLOOD LEFT ARM Performed at [facility], [address], [place], [place] [address]    Special Requests   Final    BOTTLES DRAWN AEROBIC ONLY Blood Culture results may not be  optimal due to an inadequate volume of blood received in culture bottles Performed at [facility], [address] [place], [place] [address]    Culture   Final    NO GROWTH 2 DAYS Performed at [facility], [address] [place], [place] [address]    Report Status PENDING  Incomplete     Medications:    atorvastatin  40 mg Oral Daily   carvedilol  3.125 mg Oral BID   furosemide  40 mg Oral Daily   guaiFENesin  600 mg Oral BID   ketorolac  15 mg Intravenous Q6H   nicotine  14 mg Transdermal Daily   sodium chloride flush  3 mL Intravenous Q12H   umeclidinium bromide  1 puff Inhalation Daily   Continuous Infusions:  ampicillin-sulbactam (UNASYN) IV 3 g (06/29/23 0817)      LOS: 2 days   Marinda Elk  [facility]  06/29/2023, 9:55 AM

## 2023-06-29 NOTE — Plan of Care (Signed)
  Problem: Education: Goal: Knowledge of General Education information will improve Description: Including pain rating scale, medication(s)/side effects and non-pharmacologic comfort measures Outcome: Progressing   Problem: Health Behavior/Discharge Planning: Goal: Ability to manage health-related needs will improve Outcome: Progressing   Problem: Clinical Measurements: Goal: Ability to maintain clinical measurements within normal limits will improve Outcome: Progressing Goal: Will remain free from infection Outcome: Progressing Goal: Diagnostic test results will improve Outcome: Progressing Goal: Respiratory complications will improve Outcome: Progressing Goal: Cardiovascular complication will be avoided Outcome: Progressing   Problem: Activity: Goal: Risk for activity intolerance will decrease Outcome: Progressing   Problem: Nutrition: Goal: Adequate nutrition will be maintained Outcome: Progressing   Problem: Elimination: Goal: Will not experience complications related to bowel motility Outcome: Progressing   Problem: Pain Management: Goal: General experience of comfort will improve Outcome: Progressing   Problem: Safety: Goal: Ability to remain free from injury will improve Outcome: Progressing   Problem: Skin Integrity: Goal: Risk for impaired skin integrity will decrease Outcome: Progressing

## 2023-06-30 DIAGNOSIS — J189 Pneumonia, unspecified organism: Secondary | ICD-10-CM | POA: Diagnosis not present

## 2023-06-30 LAB — ALDOLASE: Aldolase: 12.6 U/L — ABNORMAL HIGH (ref 3.3–10.3)

## 2023-06-30 NOTE — Progress Notes (Signed)
[facility] PROGRESS NOTE    Progress Note  Craig Schmidt  ZOX:[id number] DOB: 07-31-1970 DOA: 06/26/2023 PCP: Rema Fendt, NP     Brief Narrative:   Craig Schmidt is an 52 y.o. 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   Assessment/Plan:   Persistent multifocal lung infiltrates: Continue IV Unasyn. Blood cultures have been negative till date. Pulmonary defer bronchoscopy for next week. Rheumatology panel serum QuantiFERON are pending. 2D echo showed an EF of less than 20% no wall motion abnormality and grade 2 diastolic dysfunction, moderate mitral stenosis and aortic valve regurgitation no evidence of endocarditis. Relates he feels better has been weaned to room air.  Chronic combined systolic and diastolic CHF (congestive heart failure): Last EF of 20% appears euvolemic on admission. Continue Coreg and Lasix monitor strict I's and O's and daily weights restrict fluids.  Coronary artery disease Asymptomatic continue  statins. Hold Plavix.  History of CVA: Continue statins. Hold Plavix for bronchoscopy.  Essential hypertension: Continue Coreg and Lasix.  Asthma: Continue inhalers.  Hyperlipidemia: Continue statins.  Polysubstance abuse: Patient reports ongoing crack use.  DVT prophylaxis: lovenox Family Communication:none Status is: Observation The patient will require care spanning > 2 midnights and should be moved to inpatient because: Multifocal pneumonia    Code Status:     Code Status Orders  (From admission, onward)           Start     Ordered    06/27/23 0054  Full code  Continuous       Question:  By:  Answer:  Consent: discussion documented in EHR   06/27/23 0055           Code Status History     Date Active Date Inactive Code Status Order ID Comments User Context   06/19/2023 2315 06/22/2023 2017 Full Code [id number]  Steffanie Rainwater, MD ED   06/11/2023 0225 06/14/2023 0010 Full Code [id number]  Howerter, Chaney Born, DO ED   06/04/2023 0810 06/10/2023 1739 Full Code [id number]  Lorin Glass, MD ED   05/25/2023 1820 05/31/2023 2048 Full Code [id number]  Nolberto Hanlon, MD ED   11/28/2022 1620 12/05/2022 2107 Full Code [id number]  Jonah Blue, MD ED   04/13/2022 0842 04/13/2022 1705 Full Code [id number]  Laurey Morale, MD Inpatient   12/19/2021 1909 12/27/2021 2321 Full Code [id number]  Lyn Records, MD Inpatient   09/08/2021 0931 10/03/2021 2351 Full Code [id number]  Milon Dikes, MD ED   05/14/2015 1117 05/18/2015 1701 Full Code [id number]  Dorothea Ogle, MD ED         IV Access:   Peripheral IV   Procedures and diagnostic studies:   No results found.   Medical Consultants:   None.   Subjective:    Craig Schmidt relate feels better.  Objective:    Vitals:   06/29/23 0954 06/29/23 1445 06/29/23 1957 06/30/23 0614  BP: (!) 134/98 121/89 112/79 (!) 124/97  Pulse: 90 (!) 104 92 91  Resp:  18 16 18   Temp:  98.2 F (36.8 C) (!) 97.5 F (36.4 C) (!) 97.5  F (36.4 C)  TempSrc:  Oral Oral Oral  SpO2:  98% 99% 100%  Weight:      Height:       SpO2: 100 %   Intake/Output Summary (Last 24 hours) at 06/30/2023 1006 Last data filed at 06/30/2023 8413 Gross per 24 hour  Intake 640 ml  Output --  Net 640 ml   Filed Weights   06/28/23 0500  Weight: 62.1 kg    Exam: General exam: In no acute distress. Respiratory system: Good air movement and clear to auscultation. Cardiovascular system: S1 & S2 heard, RRR. No JVD. Gastrointestinal system: Abdomen is nondistended, soft and nontender.   Extremities: No pedal edema. Skin: No rashes, lesions or ulcers Psychiatry: Judgement and insight appear normal. Mood & affect appropriate. Data Reviewed:    Labs: Basic Metabolic Panel: Recent Labs  Lab 06/26/23 2053 06/27/23 1109  NA 138 136  K 4.7 4.5  CL 103 102  CO2 21* 22  GLUCOSE 91 101*  BUN 27* 26*  CREATININE 1.29* 1.32*  CALCIUM 9.4 9.2   GFR Estimated Creatinine Clearance: 58.8 mL/min (A) (by C-G formula based on SCr of 1.32 mg/dL (H)). Liver Function Tests: Recent Labs  Lab 06/26/23 2053  AST 29  ALT 11  ALKPHOS 143*  BILITOT 0.7  PROT 8.1  ALBUMIN 3.3*   No results for input(s): "LIPASE", "AMYLASE" in the last 168 hours. No results for input(s): "AMMONIA" in the last 168 hours. Coagulation profile No results for input(s): "INR", "PROTIME" in the last 168 hours. COVID-19 Labs  Recent Labs    06/27/23 1206  FERRITIN 131  CRP 1.5*    Lab Results  Component Value Date   SARSCOV2NAA NEGATIVE 06/19/2023   SARSCOV2NAA NEGATIVE 06/11/2023   SARSCOV2NAA NEGATIVE 05/25/2023   SARSCOV2NAA NEGATIVE 07/18/2022    CBC: Recent Labs  Lab 06/26/23 2053 06/27/23 1735  WBC 18.5* 9.0  NEUTROABS 11.9*  --   HGB 11.4* 11.6*  HCT 36.6* 35.8*  MCV 92.7 91.6  PLT 411* 354   Cardiac Enzymes: Recent Labs  Lab 06/27/23 1734  CKTOTAL 30*   BNP (last 3 results) No results for input(s): "PROBNP" in the last 8760 hours. CBG: No results for input(s): "GLUCAP" in the last 168 hours. D-Dimer: No results for input(s): "DDIMER" in the last 72 hours. Hgb A1c: No results for input(s): "HGBA1C" in the last 72 hours. Lipid Profile: No results for input(s): "CHOL", "HDL", "LDLCALC", "TRIG", "CHOLHDL", "LDLDIRECT" in the last 72 hours. Thyroid function studies: No results for input(s): "TSH", "T4TOTAL", "T3FREE", "THYROIDAB" in the last 72 hours.  Invalid input(s): "FREET3" Anemia work up: Recent Labs    06/27/23 1206  FERRITIN 131   Sepsis  Labs: Recent Labs  Lab 06/26/23 2053 06/26/23 2303 06/27/23 1735  WBC 18.5*  --  9.0  LATICACIDVEN  --  2.1*  --    Microbiology Recent Results (from the past 240 hour(s))  MRSA Next Gen by PCR, Nasal     Status: None   Collection Time: 06/20/23  2:16 PM   Specimen: Nasal Mucosa; Nasal Swab  Result Value Ref Range Status   MRSA by PCR Next Gen NOT DETECTED NOT DETECTED Final    Comment: (NOTE) The GeneXpert MRSA Assay (FDA approved for NASAL specimens only), is one component of a comprehensive MRSA colonization surveillance program. It is not intended to diagnose MRSA infection nor to guide or monitor treatment for MRSA infections. Test performance is not FDA approved in patients less than 2  years old. Performed at [facility], [address], [place], [place] [address]   Blood culture (routine x 2)     Status: None (Preliminary result)   Collection Time: 06/26/23 11:03 PM   Specimen: BLOOD  Result Value Ref Range Status   Specimen Description   Final    BLOOD RIGHT ANTECUBITAL Performed at [facility], [address], [place], [place] [address]    Special Requests   Final    BOTTLES DRAWN AEROBIC AND ANAEROBIC Blood Culture adequate volume Performed at [facility], [address], [place], [place] [address]    Culture   Final    NO GROWTH 2 DAYS Performed at [facility], [address], [place], [place] [address]    Report Status PENDING  Incomplete  Blood culture (routine x 2)     Status: None (Preliminary result)   Collection Time: 06/27/23 11:09 AM   Specimen: BLOOD LEFT ARM  Result Value Ref Range Status   Specimen Description   Final    BLOOD LEFT ARM Performed at [facility], [address], [place], [place] [address]    Special Requests   Final    BOTTLES DRAWN AEROBIC ONLY Blood Culture results may not be optimal due to an inadequate volume of blood received in culture  bottles Performed at [facility], [address] [place], [place] [address]    Culture   Final    NO GROWTH 2 DAYS Performed at [facility], [address], [place], [place] [address]    Report Status PENDING  Incomplete     Medications:    atorvastatin  40 mg Oral Daily   carvedilol  3.125 mg Oral BID   furosemide  40 mg Oral Daily   guaiFENesin  600 mg Oral BID   ketorolac  15 mg Intravenous Q6H   nicotine  14 mg Transdermal Daily   sodium chloride flush  3 mL Intravenous Q12H   umeclidinium bromide  1 puff Inhalation Daily   Continuous Infusions:  ampicillin-sulbactam (UNASYN) IV 3 g (06/30/23 0828)      LOS: 3 days   Craig Schmidt  [facility]  06/30/2023, 10:06 AM

## 2023-06-30 NOTE — Plan of Care (Signed)

## 2023-06-30 NOTE — Plan of Care (Signed)
  Problem: Education: Goal: Knowledge of General Education information will improve Description: Including pain rating scale, medication(s)/side effects and non-pharmacologic comfort measures Outcome: Progressing   Problem: Health Behavior/Discharge Planning: Goal: Ability to manage health-related needs will improve Outcome: Progressing   Problem: Clinical Measurements: Goal: Ability to maintain clinical measurements within normal limits will improve Outcome: Progressing   Problem: Clinical Measurements: Goal: Ability to maintain clinical measurements within normal limits will improve Outcome: Progressing Goal: Will remain free from infection Outcome: Progressing Goal: Diagnostic test results will improve Outcome: Progressing Goal: Respiratory complications will improve Outcome: Progressing Goal: Cardiovascular complication will be avoided Outcome: Progressing   Problem: Activity: Goal: Risk for activity intolerance will decrease Outcome: Progressing   Problem: Pain Management: Goal: General experience of comfort will improve Outcome: Progressing   Problem: Safety: Goal: Ability to remain free from injury will improve Outcome: Progressing   Problem: Skin Integrity: Goal: Risk for impaired skin integrity will decrease Outcome: Progressing

## 2023-07-01 DIAGNOSIS — J189 Pneumonia, unspecified organism: Secondary | ICD-10-CM | POA: Diagnosis not present

## 2023-07-01 LAB — ANTINUCLEAR ANTIBODIES, IFA: ANA Ab, IFA: NEGATIVE

## 2023-07-01 LAB — QUANTIFERON-TB GOLD PLUS: QuantiFERON-TB Gold Plus: NEGATIVE

## 2023-07-01 LAB — QUANTIFERON-TB GOLD PLUS (RQFGPL)
QuantiFERON Mitogen Value: >10 [IU]/mL
QuantiFERON Nil Value: 0.02 [IU]/mL
QuantiFERON TB1 Ag Value: 0.02 [IU]/mL
QuantiFERON TB2 Ag Value: 0.03 [IU]/mL

## 2023-07-01 NOTE — Progress Notes (Addendum)
   NAME:  Craig Schmidt, MRN:  [id number], DOB:  1971/05/22, LOS: 4 ADMISSION DATE:  06/26/2023, CONSULTATION DATE: 06/27/2023 REFERRING MD: TRH, CHIEF COMPLAINT: Cough  History of Present Illness:  52 year old homeless man with addiction to cigarettes and crack cocaine presenting with >1 month dry cough, chills, pleurisy with background of 6 months unintentional weight loss.  He has been to the hospital 5 times for this issue since 05/25/23.   No TB exposures that he knows of.  No sick contacts. Comorbidities include ischemic cardiomyopathy, severe mitral valve issues (see TEE 2023), CKD3a, Prior CVA's with residual RUE weakness No aspiration symptoms ROS as below  Pertinent  Medical History   Past Medical History:  Diagnosis Date   Alcohol abuse    Asthma    Brain tumor (HCC)    CAD (coronary artery disease)    Chronic HFrEF (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)    Chronic kidney disease, stage 3 (HCC)    Cocaine abuse (HCC)    History of medication noncompliance    Homelessness    MI (myocardial infarction) (HCC)    STEMI (ST elevation myocardial infarction) (HCC)    Stroke (HCC)    Significant Hospital Events: Including procedures, antibiotic start and stop dates in addition to other pertinent events   [place] [address] 24 reviewed  Interim History / Subjective:  Cough, some chest discomfort  Objective   Blood pressure (!) 138/97, pulse 95, temperature 97.7 F (36.5 C), temperature source Oral, resp. rate 16, height 5\' 7"  (1.702 m), weight 62.1 kg, SpO2 100%.       No intake or output data in the 24 hours ending 07/01/23 0925 Filed Weights   06/28/23 0500  Weight: 62.1 kg    Examination: General: Chronically ill-appearing HENT: Poor dentition Lungs: Some rales at the bases Cardiovascular: S1-S2 appreciated Abdomen: Soft, bowel sounds appreciated Extremities: No clubbing, no edema, muscle wasting Neuro: Awake alert interactive GU:   CT scan was  reviewed by myself showing areas of necrotic pneumonia Resolved Hospital Problem list     Assessment & Plan:  Nonresolving pneumonia Necrotizing pneumonia  Active smoker Uses crack  Ischemic cardiomyopathy, stroke  Remains on Unasyn Plavix on hold  For bronchoscopy 07/02/2023  Obstructive lung disease, active smoker -Continue bronchodilator treatments -Smoking cessation counseling    Best Practice (right click and "Reselect all SmartList Selections" daily)   Per primary  Lab work reviewed  Virl Diamond, MD [place] [facility] Pager: See Loretha Stapler

## 2023-07-01 NOTE — TOC Progression Note (Signed)
Transition of Care [facility]) - Progression Note    Patient Details  Name: Craig Schmidt MRN: [id number] Date of Birth: November 16, 1970  Transition of [facility] CM/SW Contact  Darleene Cleaver, [place] Phone Number: 07/01/2023, 4:08 PM  Clinical Narrative:     TOC continuing to follow follow patient's progress throughout discharge planning.  Resources were provided for homelessness, food, clothing, and substance abuse by previous TOC Child psychotherapist.    Expected Discharge Plan: Homeless Shelter Barriers to Discharge: No Barriers Identified  Expected Discharge Plan and Services In-house Referral: Clinical Social Work Discharge Planning Services: NA Post Acute Care Choice: NA Living arrangements for the past 2 months: Homeless                 DME Arranged: N/A DME Agency: NA                   Social Determinants of Health (SDOH) Interventions SDOH Screenings   Food Insecurity: Food Insecurity Present (06/27/2023)  Housing: High Risk (06/27/2023)  Transportation Needs: Unmet Transportation Needs (06/27/2023)  Utilities: At Risk (06/27/2023)  Alcohol Screen: High Risk (09/19/2021)  Depression (PHQ2-9): High Risk (06/13/2022)  Financial Resource Strain: High Risk (09/07/2022)  Stress: Stress Concern Present (10/16/2022)  Tobacco Use: High Risk (06/26/2023)    Readmission Risk Interventions    06/28/2023    1:27 PM 06/21/2023   12:50 PM  Readmission Risk Prevention Plan  Transportation Screening Complete Complete  PCP or Specialist Appt within 3-5 Days Complete   HRI or Home Care Consult Complete   Social Work Consult for Recovery Care Planning/Counseling Complete   Palliative Care Screening Not Applicable   Medication Review Ocean[facility]) Complete Complete  PCP or Specialist appointment within 3-5 days of discharge  Complete  HRI or Home Care Consult  Complete  SW Recovery Care/Counseling Consult  Complete  Palliative Care Screening  Not Applicable  Skilled Nursing  Facility  Not Applicable

## 2023-07-01 NOTE — Progress Notes (Signed)
Physical Therapy Treatment Patient Details Name: Craig Schmidt MRN: [id number] DOB: 12-11-1970 Today's Date: 07/01/2023   History of Present Illness Pt is a 52 y.o. male admitted for L flank pain, multifocal pneumonia & SOB on 06/26/2023. Chest CT revealing necrotic mass like consolidations, mediastinal adenopathy and pt scheduled for biopsy 12/5. Recent hospitalization 11/24- 11/30 as well as pt leaving AMA on 11/08, readmitted on 11/12 and discharged 11/18. PMH significant for homelessness, CVA with residual R sided hemiparesis, cocaine abuse, alcohol abuse, medication non-adherence, CAD, cardiomyopathy, NSTEMI, HFrEF, sepsis, hyperlipidemia, CKD.    PT Comments  Pt making good progress today.  He ambulated 200' with cane and supervision with standing rest breaks.  Pt does have altered gait pattern and expressed concern of falling, but suspect near baseline (see general comments).  Will continue to progress with PT while hospitalized, but no PT needs at d/c.     If plan is discharge home, recommend the following: Assistance with cooking/housework;Assist for transportation;Help with stairs or ramp for entrance;A little help with walking and/or transfers;A little help with bathing/dressing/bathroom   Can travel by private vehicle     Yes  Equipment Recommendations  None recommended by PT    Recommendations for Other Services       Precautions / Restrictions Precautions Precautions: Fall Precaution Comments: Baseline R hemi. R shoulder subluxation -requested new sling from ortho tech     Mobility  Bed Mobility Overal bed mobility: Needs Assistance Bed Mobility: Supine to Sit, Sit to Supine Rolling: Independent   Supine to sit: Supervision Sit to supine: Supervision        Transfers Overall transfer level: Needs assistance Equipment used: Straight cane Transfers: Sit to/from Stand Sit to Stand: Supervision           General transfer comment: Performed x 2 with  superivison    Ambulation/Gait Ambulation/Gait assistance: Contact guard assist, Supervision Gait Distance (Feet): 200 Feet Assistive device: Straight cane Gait Pattern/deviations: Step-through pattern, Decreased stride length, Decreased dorsiflexion - right, Decreased stance time - right, Shuffle Gait velocity: decreased     General Gait Details: Pt using personal SPC (too tall but he prefers and holds below handle).  He ambulated total of 200' with 3 standing rest breaks in 2nd half due to shortness of breath. Unable to get pulse ox reading.  Pt with altered gait pattern due to R hemiparesis -suspect near baseline.  Educated on taking rest breaks as needed and gradually increasing endurance.  Pt expressed some difficulty doing this as homeless and has to walk longer distances.  Reports having a rollator and w/c - discussed using for breaks.   Stairs             Wheelchair Mobility     Tilt Bed    Modified Rankin (Stroke Patients Only)       Balance Overall balance assessment: Needs assistance Sitting-balance support: Feet supported, No upper extremity supported Sitting balance-Leahy Scale: Good     Standing balance support: Single extremity supported, No upper extremity supported Standing balance-Leahy Scale: Fair Standing balance comment: Using cane to ambulate, but holding up at times; no LOB                            Cognition Arousal: Alert Behavior During Therapy: WFL for tasks assessed/performed Overall Cognitive Status: Within Functional Limits for tasks assessed  General Comments: Likely baseline        Exercises      General Comments General comments (skin integrity, edema, etc.): Pt multiple concerns including R UE subluxations - contacted orthotech for new sling. Concern of R LE with tendency to develop edema and cause knee pain/weakness.  At this time, no edema and 5/5 strength.  Discussed  compression socks for edema and provided ACE wrap to try on knee when swollen.  Also expressed concern of falling - he is fall risk with R sided deficits, decreased endurance, substance abuse, and homeless but likely near his baseline. Discussed safety , rest breaks, use of AD. Educated on stair sequencing for curbs "up with good and down with bad."       Pertinent Vitals/Pain Pain Assessment Pain Assessment: No/denies pain    Home Living                          Prior Function            PT Goals (current goals can now be found in the care plan section) Progress towards PT goals: Progressing toward goals    Frequency    Min 1X/week      PT Plan      Co-evaluation              AM-PAC PT "6 Clicks" Mobility   Outcome Measure  Help needed turning from your back to your side while in a flat bed without using bedrails?: None Help needed moving from lying on your back to sitting on the side of a flat bed without using bedrails?: None Help needed moving to and from a bed to a chair (including a wheelchair)?: A Little Help needed standing up from a chair using your arms (e.g., wheelchair or bedside chair)?: A Little Help needed to walk in hospital room?: A Little Help needed climbing 3-5 steps with a railing? : A Little 6 Click Score: 20    End of Session Equipment Utilized During Treatment: Gait belt Activity Tolerance: Patient tolerated treatment well Patient left: with call bell/phone within reach;with family/visitor present;in bed;with bed alarm set Nurse Communication: Mobility status PT Visit Diagnosis: Difficulty in walking, not elsewhere classified (R26.2);Muscle weakness (generalized) (M62.81);Unsteadiness on feet (R26.81)     Time: 1610-9604 PT Time Calculation (min) (ACUTE ONLY): 35 min  Charges:    $Gait Training: 8-22 mins $Therapeutic Activity: 8-22 mins PT General Charges $$ ACUTE PT VISIT: 1 Visit                     Anise Salvo,  PT Acute Rehab Services [facility] [phone]    Rayetta Humphrey 07/01/2023, 5:03 PM

## 2023-07-01 NOTE — Progress Notes (Signed)
[facility] PROGRESS NOTE    Progress Note  Craig Schmidt  YQM:[id number] DOB: 1970-08-13 DOA: 06/26/2023 PCP: Rema Fendt, NP     Brief Narrative:   Craig Schmidt is an 52 y.o. 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   Assessment/Plan:   Persistent multifocal lung infiltrates: Continue IV Unasyn.  There is probably a component of noncompliance with his antibiotics as an outpatient. Blood cultures have been negative till date. Pulmonary defer bronchoscopy for this week. Rheumatology panel serum QuantiFERON are pending. 2D echo showed an EF of less than 20% no wall motion abnormality and grade 2 diastolic dysfunction, moderate mitral stenosis and aortic valve regurgitation no evidence of endocarditis. Relates he feels better has been weaned to room air.  Chronic combined systolic and diastolic CHF (congestive heart failure): Last EF of 20% appears euvolemic on admission. Continue Coreg and Lasix monitor strict I's and O's and daily weights restrict fluids.  Coronary artery disease Asymptomatic continue  statins. Hold Plavix.  History of CVA: Continue statins. Hold Plavix for bronchoscopy.  Essential hypertension: Continue Coreg and Lasix.  Asthma: Continue inhalers.  Hyperlipidemia: Continue statins.  Polysubstance abuse: Patient reports ongoing crack use.  DVT prophylaxis: lovenox Family Communication:none Status is: Observation The patient will require care spanning > 2 midnights and should be moved to inpatient because: Multifocal pneumonia    Code Status:      Code Status Orders  (From admission, onward)           Start     Ordered   06/27/23 0054  Full code  Continuous       Question:  By:  Answer:  Consent: discussion documented in EHR   06/27/23 0055           Code Status History     Date Active Date Inactive Code Status Order ID Comments User Context   06/19/2023 2315 06/22/2023 2017 Full Code [id number]  Steffanie Rainwater, MD ED   06/11/2023 0225 06/14/2023 0010 Full Code [id number]  Howerter, Chaney Born, DO ED   06/04/2023 0810 06/10/2023 1739 Full Code [id number]  Lorin Glass, MD ED   05/25/2023 1820 05/31/2023 2048 Full Code [id number]  Nolberto Hanlon, MD ED   11/28/2022 1620 12/05/2022 2107 Full Code [id number]  Jonah Blue, MD ED   04/13/2022 0842 04/13/2022 1705 Full Code [id number]  Laurey Morale, MD Inpatient   12/19/2021 1909 12/27/2021 2321 Full Code [id number]  Lyn Records, MD Inpatient   09/08/2021 0931 10/03/2021 2351 Full Code [id number]  Milon Dikes, MD ED   05/14/2015 1117 05/18/2015 1701 Full Code [id number]  Dorothea Ogle, MD ED         IV Access:   Peripheral IV   Procedures and diagnostic studies:   No results found.   Medical Consultants:   None.   Subjective:    Craig Schmidt feels better no complaints.  Objective:    Vitals:   06/30/23 0614 06/30/23 1323 06/30/23 2138 07/01/23 0617  BP: (!) 124/97 (!) 139/93 118/88 (!) 138/97  Pulse: 91 88 (!) 101 95  Resp: 18 16  16   Temp: (!) 97.5 F (36.4 C) 98.1 F (36.7 C) 98 F (36.7 C) 97.7 F (36.5 C)  TempSrc: Oral Oral Oral Oral  SpO2: 100% 100% 100% 100%  Weight:      Height:       SpO2: 100 %  No intake or output data in the 24 hours ending 07/01/23 0943  Filed Weights   06/28/23 0500  Weight: 62.1 kg    Exam: General exam: In no acute distress. Respiratory system: Good air movement and clear to auscultation. Cardiovascular system: S1 & S2 heard, RRR. No JVD. Gastrointestinal system: Abdomen is nondistended,  soft and nontender.  Extremities: No pedal edema. Skin: No rashes, lesions or ulcers Psychiatry: Judgement and insight appear normal. Mood & affect appropriate. Data Reviewed:    Labs: Basic Metabolic Panel: Recent Labs  Lab 06/26/23 2053 06/27/23 1109  NA 138 136  K 4.7 4.5  CL 103 102  CO2 21* 22  GLUCOSE 91 101*  BUN 27* 26*  CREATININE 1.29* 1.32*  CALCIUM 9.4 9.2   GFR Estimated Creatinine Clearance: 58.8 mL/min (A) (by C-G formula based on SCr of 1.32 mg/dL (H)). Liver Function Tests: Recent Labs  Lab 06/26/23 2053  AST 29  ALT 11  ALKPHOS 143*  BILITOT 0.7  PROT 8.1  ALBUMIN 3.3*   No results for input(s): "LIPASE", "AMYLASE" in the last 168 hours. No results for input(s): "AMMONIA" in the last 168 hours. Coagulation profile No results for input(s): "INR", "PROTIME" in the last 168 hours. COVID-19 Labs  No results for input(s): "DDIMER", "FERRITIN", "LDH", "CRP" in the last 72 hours.   Lab Results  Component Value Date   SARSCOV2NAA NEGATIVE 06/19/2023   SARSCOV2NAA NEGATIVE 06/11/2023   SARSCOV2NAA NEGATIVE 05/25/2023   SARSCOV2NAA NEGATIVE 07/18/2022    CBC: Recent Labs  Lab 06/26/23 2053 06/27/23 1735  WBC 18.5* 9.0  NEUTROABS 11.9*  --   HGB 11.4* 11.6*  HCT 36.6* 35.8*  MCV 92.7 91.6  PLT 411* 354   Cardiac Enzymes: Recent Labs  Lab 06/27/23 1734  CKTOTAL 30*   BNP (last 3 results) No results for input(s): "PROBNP" in the last 8760 hours. CBG: No results for input(s): "GLUCAP" in the last 168 hours. D-Dimer: No results for input(s): "DDIMER" in the last 72 hours. Hgb A1c: No results for input(s): "HGBA1C" in the last 72 hours. Lipid Profile: No results for input(s): "CHOL", "HDL", "LDLCALC", "TRIG", "CHOLHDL", "LDLDIRECT" in the last 72 hours. Thyroid function studies: No results for input(s): "TSH", "T4TOTAL", "T3FREE", "THYROIDAB" in the last 72 hours.  Invalid input(s): "FREET3" Anemia work up: No results for  input(s): "VITAMINB12", "FOLATE", "FERRITIN", "TIBC", "IRON", "RETICCTPCT" in the last 72 hours.  Sepsis Labs: Recent Labs  Lab 06/26/23 2053 06/26/23 2303 06/27/23 1735  WBC 18.5*  --  9.0  LATICACIDVEN  --  2.1*  --    Microbiology Recent Results (from the past 240 hour(s))  Blood culture (routine x 2)     Status: None (Preliminary result)   Collection Time: 06/26/23 11:03 PM   Specimen: BLOOD  Result Value Ref Range Status   Specimen Description   Final    BLOOD RIGHT ANTECUBITAL Performed at [facility], [address], [place], [place] [address]    Special Requests   Final    BOTTLES DRAWN AEROBIC AND ANAEROBIC Blood Culture adequate volume Performed at [facility], [address] [place], [place] [address]    Culture   Final  NO GROWTH 4 DAYS Performed at [facility], [address] [place], [place] [address]    Report Status PENDING  Incomplete  Blood culture (routine x 2)     Status: None (Preliminary result)   Collection Time: 06/27/23 11:09 AM   Specimen: BLOOD LEFT ARM  Result Value Ref Range Status   Specimen Description   Final    BLOOD LEFT ARM Performed at [facility], [address] [place], [place] [address]    Special Requests   Final    BOTTLES DRAWN AEROBIC ONLY Blood Culture results may not be optimal due to an inadequate volume of blood received in culture bottles Performed at [facility], [address] [place], [place] [address]    Culture   Final    NO GROWTH 4 DAYS Performed at [facility], [address] [place], [place] [address]    Report Status PENDING  Incomplete     Medications:    atorvastatin  40 mg Oral Daily   carvedilol  3.125 mg Oral BID   furosemide  40 mg Oral Daily   guaiFENesin  600 mg Oral BID   ketorolac  15 mg Intravenous Q6H   nicotine  14 mg Transdermal Daily   sodium chloride flush  3 mL Intravenous Q12H   umeclidinium  bromide  1 puff Inhalation Daily   Continuous Infusions:  ampicillin-sulbactam (UNASYN) IV 3 g (07/01/23 0809)      LOS: 4 days   Marinda Elk  [facility]  07/01/2023, 9:43 AM

## 2023-07-01 NOTE — Plan of Care (Signed)

## 2023-07-02 ENCOUNTER — Encounter (INDEPENDENT_AMBULATORY_CARE_PROVIDER_SITE_OTHER): Payer: Medicaid Other | Admitting: Family

## 2023-07-02 ENCOUNTER — Encounter (HOSPITAL_COMMUNITY): Payer: Self-pay | Admitting: Internal Medicine

## 2023-07-02 ENCOUNTER — Encounter (HOSPITAL_COMMUNITY)
Admission: EM | Disposition: A | Discharge status: Home / Self Care | Payer: Self-pay | Source: Home / Self Care | Attending: Internal Medicine

## 2023-07-02 DIAGNOSIS — I5042 Chronic combined systolic (congestive) and diastolic (congestive) heart failure: Secondary | ICD-10-CM | POA: Diagnosis not present

## 2023-07-02 DIAGNOSIS — I10 Essential (primary) hypertension: Secondary | ICD-10-CM | POA: Diagnosis not present

## 2023-07-02 DIAGNOSIS — J189 Pneumonia, unspecified organism: Secondary | ICD-10-CM | POA: Diagnosis not present

## 2023-07-02 LAB — CULTURE, BLOOD (ROUTINE X 2)
Culture: NO GROWTH
Culture: NO GROWTH
Special Requests: ADEQUATE

## 2023-07-02 SURGERY — CANCELLED PROCEDURE
Anesthesia: General

## 2023-07-02 MED ORDER — WITCH HAZEL-GLYCERIN EX PADS
MEDICATED_PAD | CUTANEOUS | Status: DC | PRN
  Filled 2023-07-02: qty 100

## 2023-07-02 NOTE — Anesthesia Preprocedure Evaluation (Signed)
Anesthesia Evaluation  Patient identified by MRN, date of birth, ID band Patient awake    Reviewed: Allergy & Precautions, H&P , NPO status , Patient's Chart, lab work & pertinent test results, Unable to perform ROS - Chart review only  Airway Mallampati: II   Neck ROM: full    Dental   Pulmonary asthma , pneumonia, COPD,  COPD inhaler, Current Smoker and Patient abstained from smoking. Lung masses   breath sounds clear to auscultation       Cardiovascular hypertension, Pt. on medications and Pt. on home beta blockers + CAD, + Past MI and +CHF   Rhythm:regular Rate:Normal  06/27/2023 ECHO: EF <20%, severely decreased LVF with global hypokinesis, Grade 2 DD, mod reduced RVF, mod mitral stenosis, mild MR  '23 cath:   Occlusion of apical LAD, suspect embolic versus plaque rupture with thrombosis.  Right dominant coronary anatomy.  Significant increase in diameter of right coronary after 200 mcg of intracoronary nitroglycerin was administered.  Generalized luminal irregularities throughout the left coronary system.  Severe systolic dysfunction with EF less than 25% and LVEDP greater than 25 mmHg.    Neuro/Psych H/o brain tumor CVA    GI/Hepatic ,,,(+)     substance abuse  alcohol use and cocaine use  Endo/Other    Renal/GU Renal InsufficiencyRenal disease     Musculoskeletal   Abdominal   Peds  Hematology  (+) Blood dyscrasia, anemia   Anesthesia Other Findings   Reproductive/Obstetrics                             Anesthesia Physical Anesthesia Plan  ASA: 4  Anesthesia Plan: General   Post-op Pain Management: Minimal or no pain anticipated   Induction: Intravenous  PONV Risk Score and Plan: 1 and Ondansetron and Dexamethasone  Airway Management Planned: Oral ETT  Additional Equipment: None  Intra-op Plan:   Post-operative Plan: Extubation in OR  Informed Consent: I have  reviewed the patients History and Physical, chart, labs and discussed the procedure including the risks, benefits and alternatives for the proposed anesthesia with the patient or authorized representative who has indicated his/her understanding and acceptance.     Dental advisory given  Plan Discussed with: CRNA, Anesthesiologist and Surgeon  Anesthesia Plan Comments: (Based on history and EF < 20%, patient needs to be done at [facility])        Anesthesia Quick Evaluation

## 2023-07-02 NOTE — Plan of Care (Signed)
   Problem: Education: Goal: Knowledge of General Education information will improve Description: Including pain rating scale, medication(s)/side effects and non-pharmacologic comfort measures Outcome: Progressing   Problem: Clinical Measurements: Goal: Ability to maintain clinical measurements within normal limits will improve Outcome: Progressing   Problem: Activity: Goal: Risk for activity intolerance will decrease Outcome: Progressing   Problem: Nutrition: Goal: Adequate nutrition will be maintained Outcome: Progressing   Problem: Elimination: Goal: Will not experience complications related to bowel motility Outcome: Progressing   Problem: Pain Management: Goal: General experience of comfort will improve Outcome: Progressing   Problem: Safety: Goal: Ability to remain free from injury will improve Outcome: Progressing   Problem: Skin Integrity: Goal: Risk for impaired skin integrity will decrease Outcome: Progressing

## 2023-07-02 NOTE — Progress Notes (Signed)
Informed patient about rescheduling his procedure at [facility] Procedure being rescheduled to be performed at [facility] based on his ejection fraction of 20%  Will be rescheduled for 9 AM tomorrow  EBUS bronchoscopy  Needs to be n.p.o. from midnight  Discussed with Dr. Constance Holster will be transferred to [facility]

## 2023-07-02 NOTE — Progress Notes (Signed)
Called report to [facility] ,Charity fundraiser at [employer] [address] .

## 2023-07-02 NOTE — H&P (View-Only) (Signed)
   NAME:  Craig Schmidt, MRN:  [id number], DOB:  1970-08-14, LOS: 5 ADMISSION DATE:  06/26/2023, CONSULTATION DATE: 06/27/2023 REFERRING MD: TRH, CHIEF COMPLAINT: Cough  History of Present Illness:  52 year old homeless man with addiction to cigarettes and crack cocaine presenting with >1 month dry cough, chills, pleurisy with background of 6 months unintentional weight loss.  He has been to the hospital 5 times for this issue since 05/25/23.   No TB exposures that he knows of.  No sick contacts. Comorbidities include ischemic cardiomyopathy, severe mitral valve issues (see TEE 2023), CKD3a, Prior CVA's with residual RUE weakness No aspiration symptoms ROS as below  Pertinent  Medical History   Past Medical History:  Diagnosis Date   Alcohol abuse    Asthma    Brain tumor (HCC)    CAD (coronary artery disease)    Chronic HFrEF (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)    Chronic kidney disease, stage 3 (HCC)    Cocaine abuse (HCC)    History of medication noncompliance    Homelessness    MI (myocardial infarction) (HCC)    STEMI (ST elevation myocardial infarction) (HCC)    Stroke (HCC)    Significant Hospital Events: Including procedures, antibiotic start and stop dates in addition to other pertinent events   CT 78295 24 reviewed  Interim History / Subjective:  Continues to complain of chest discomfort  Objective   Blood pressure (!) 152/116, pulse 95, temperature 97.9 F (36.6 C), temperature source Oral, resp. rate 16, height 5\' 7"  (1.702 m), weight 62.1 kg, SpO2 96%.        Intake/Output Summary (Last 24 hours) at 07/02/2023 6213 Last data filed at 07/02/2023 0500 Gross per 24 hour  Intake 800 ml  Output 100 ml  Net 700 ml   Filed Weights   06/28/23 0500  Weight: 62.1 kg    Examination: General: Chronically ill-appearing HENT: Poor dentition Lungs: Rales at the bases Cardiovascular: S1-S2 appreciated Abdomen: Soft, bowel sounds  appreciated Extremities: No pain, no edema, muscle wasting Neuro: Awake alert interactive GU:   CT chest reviewed by myself  Lab work reviewed  Resolved Hospital Problem list     Assessment & Plan:  Nonresolving pneumonia Necrotizing pneumonia  On Unasyn for pneumonia  Plavix has been on hold  For bronchoscopy today  Obstructive lung disease, active smoker -Continue bronchodilator treatment Smoking cessation counseling  Pain management as able  Best Practice (right click and "Reselect all SmartList Selections" daily)   Per primary  Lab work reviewed  Virl Diamond, MD [facility] Pager: See Loretha Stapler

## 2023-07-02 NOTE — Progress Notes (Signed)
   NAME:  Craig Schmidt, MRN:  [id number], DOB:  1970-08-14, LOS: 5 ADMISSION DATE:  06/26/2023, CONSULTATION DATE: 06/27/2023 REFERRING MD: TRH, CHIEF COMPLAINT: Cough  History of Present Illness:  52 year old homeless man with addiction to cigarettes and crack cocaine presenting with >1 month dry cough, chills, pleurisy with background of 6 months unintentional weight loss.  He has been to the hospital 5 times for this issue since 05/25/23.   No TB exposures that he knows of.  No sick contacts. Comorbidities include ischemic cardiomyopathy, severe mitral valve issues (see TEE 2023), CKD3a, Prior CVA's with residual RUE weakness No aspiration symptoms ROS as below  Pertinent  Medical History   Past Medical History:  Diagnosis Date   Alcohol abuse    Asthma    Brain tumor (HCC)    CAD (coronary artery disease)    Chronic HFrEF (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)    Chronic kidney disease, stage 3 (HCC)    Cocaine abuse (HCC)    History of medication noncompliance    Homelessness    MI (myocardial infarction) (HCC)    STEMI (ST elevation myocardial infarction) (HCC)    Stroke (HCC)    Significant Hospital Events: Including procedures, antibiotic start and stop dates in addition to other pertinent events   CT 78295 24 reviewed  Interim History / Subjective:  Continues to complain of chest discomfort  Objective   Blood pressure (!) 152/116, pulse 95, temperature 97.9 F (36.6 C), temperature source Oral, resp. rate 16, height 5\' 7"  (1.702 m), weight 62.1 kg, SpO2 96%.        Intake/Output Summary (Last 24 hours) at 07/02/2023 6213 Last data filed at 07/02/2023 0500 Gross per 24 hour  Intake 800 ml  Output 100 ml  Net 700 ml   Filed Weights   06/28/23 0500  Weight: 62.1 kg    Examination: General: Chronically ill-appearing HENT: Poor dentition Lungs: Rales at the bases Cardiovascular: S1-S2 appreciated Abdomen: Soft, bowel sounds  appreciated Extremities: No pain, no edema, muscle wasting Neuro: Awake alert interactive GU:   CT chest reviewed by myself  Lab work reviewed  Resolved Hospital Problem list     Assessment & Plan:  Nonresolving pneumonia Necrotizing pneumonia  On Unasyn for pneumonia  Plavix has been on hold  For bronchoscopy today  Obstructive lung disease, active smoker -Continue bronchodilator treatment Smoking cessation counseling  Pain management as able  Best Practice (right click and "Reselect all SmartList Selections" daily)   Per primary  Lab work reviewed  Virl Diamond, MD [facility] Pager: See Loretha Stapler

## 2023-07-02 NOTE — Progress Notes (Signed)
Procedure to be cancelled due to EF less than 20.  Dr Wynona Neat is ok to cancel procedure.

## 2023-07-02 NOTE — Progress Notes (Signed)
[facility] transported by [facility] to [facility]. All belongings were taken by pt's caregiver. Caregiver was given a bus pass. Report given to unit by off going nurse. Pt will be going to [facility]. PIV intact

## 2023-07-02 NOTE — Progress Notes (Signed)
Erroneous encounter-disregard

## 2023-07-02 NOTE — Progress Notes (Signed)
[facility] PROGRESS NOTE    Progress Note  Craig Schmidt  YQI:[id number] DOB: 1971-07-16 DOA: 06/26/2023 PCP: Rema Fendt, NP     Brief Narrative:   Craig Schmidt is an 52 y.o. 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  Assessment/Plan:   Persistent multifocal lung infiltrates: Continue IV Unasyn.  There is probably a component of noncompliance with his antibiotics as an outpatient. Blood cultures have been negative till date. Pulmonary recommended bronchoscopy that cannot be done was along due to his EF less than 20%, echo did not show endocarditis. Rheumatology panel serum QuantiFERON are pending. Will transfer to [facility] for bronchoscopy on 07/03/2023, pulmonary involved as well as anesthesia.  Chronic combined systolic and diastolic CHF (congestive heart failure): Last EF of 20% appears euvolemic on admission. Continue Coreg and Lasix monitor strict I's and O's and daily weights restrict fluids.  Coronary artery disease Asymptomatic continue  statins. Hold Plavix.  History of CVA: Continue statins. Hold Plavix for bronchoscopy.  Essential hypertension: Continue Coreg and Lasix.  Asthma: Continue inhalers.  Hyperlipidemia: Continue statins.  Polysubstance abuse: Patient reports ongoing crack use.  DVT prophylaxis: lovenox Family Communication:none Status is: Observation The patient will require care spanning > 2 midnights and should be moved to inpatient because: Multifocal pneumonia    Code Status:     Code Status Orders  (From admission, onward)            Start     Ordered   06/27/23 0054  Full code  Continuous       Question:  By:  Answer:  Consent: discussion documented in EHR   06/27/23 0055           Code Status History     Date Active Date Inactive Code Status Order ID Comments User Context   06/19/2023 2315 06/22/2023 2017 Full Code [id number]  Steffanie Rainwater, MD ED   06/11/2023 0225 06/14/2023 0010 Full Code [id number]  Howerter, Chaney Born, DO ED   06/04/2023 0810 06/10/2023 1739 Full Code [id number]  Lorin Glass, MD ED   05/25/2023 1820 05/31/2023 2048 Full Code [id number]  Nolberto Hanlon, MD ED   11/28/2022 1620 12/05/2022 2107 Full Code [id number]  Jonah Blue, MD ED   04/13/2022 0842 04/13/2022 1705 Full Code [id number]  Laurey Morale, MD Inpatient   12/19/2021 1909 12/27/2021 2321 Full Code [id number]  Lyn Records, MD Inpatient   09/08/2021 0931 10/03/2021 2351 Full Code [id number]  Milon Dikes, MD ED   05/14/2015 1117 05/18/2015 1701 Full Code [id number]  Dorothea Ogle, MD ED         IV Access:   Peripheral IV   Procedures and diagnostic studies:   No results found.   Medical Consultants:   None.   Subjective:    Craig Schmidt no complaints  Objective:    Vitals:   07/01/23 2235 07/01/23 2252 07/02/23 0501 07/02/23 0819  BP: (!) 141/103  (!) 152/116   Pulse: 86  95   Resp: 16  16   Temp:   97.9 F (36.6 C)   TempSrc:  Oral   SpO2: 100% 99% 99% 96%  Weight:      Height:       SpO2: 96 %   Intake/Output Summary (Last 24 hours) at 07/02/2023 1011 Last data filed at 07/02/2023 0500 Gross per 24 hour  Intake 800 ml  Output 100 ml  Net 700 ml    Filed Weights   06/28/23 0500  Weight: 62.1 kg    Exam: General exam: In no acute distress. Respiratory system: Good air movement and clear to auscultation. Cardiovascular system: S1 & S2 heard, RRR. No JVD. Gastrointestinal system: Abdomen is nondistended, soft and nontender.  Extremities: No pedal edema. Skin: No rashes,  lesions or ulcers Psychiatry: Judgement and insight appear normal. Mood & affect appropriate. Data Reviewed:    Labs: Basic Metabolic Panel: Recent Labs  Lab 06/26/23 2053 06/27/23 1109  NA 138 136  K 4.7 4.5  CL 103 102  CO2 21* 22  GLUCOSE 91 101*  BUN 27* 26*  CREATININE 1.29* 1.32*  CALCIUM 9.4 9.2   GFR Estimated Creatinine Clearance: 58.8 mL/min (A) (by C-G formula based on SCr of 1.32 mg/dL (H)). Liver Function Tests: Recent Labs  Lab 06/26/23 2053  AST 29  ALT 11  ALKPHOS 143*  BILITOT 0.7  PROT 8.1  ALBUMIN 3.3*   No results for input(s): "LIPASE", "AMYLASE" in the last 168 hours. No results for input(s): "AMMONIA" in the last 168 hours. Coagulation profile No results for input(s): "INR", "PROTIME" in the last 168 hours. COVID-19 Labs  No results for input(s): "DDIMER", "FERRITIN", "LDH", "CRP" in the last 72 hours.   Lab Results  Component Value Date   SARSCOV2NAA NEGATIVE 06/19/2023   SARSCOV2NAA NEGATIVE 06/11/2023   SARSCOV2NAA NEGATIVE 05/25/2023   SARSCOV2NAA NEGATIVE 07/18/2022    CBC: Recent Labs  Lab 06/26/23 2053 06/27/23 1735  WBC 18.5* 9.0  NEUTROABS 11.9*  --   HGB 11.4* 11.6*  HCT 36.6* 35.8*  MCV 92.7 91.6  PLT 411* 354   Cardiac Enzymes: Recent Labs  Lab 06/27/23 1734  CKTOTAL 30*   BNP (last 3 results) No results for input(s): "PROBNP" in the last 8760 hours. CBG: No results for input(s): "GLUCAP" in the last 168 hours. D-Dimer: No results for input(s): "DDIMER" in the last 72 hours. Hgb A1c: No results for input(s): "HGBA1C" in the last 72 hours. Lipid Profile: No results for input(s): "CHOL", "HDL", "LDLCALC", "TRIG", "CHOLHDL", "LDLDIRECT" in the last 72 hours. Thyroid function studies: No results for input(s): "TSH", "T4TOTAL", "T3FREE", "THYROIDAB" in the last 72 hours.  Invalid input(s): "FREET3" Anemia work up: No results for input(s): "VITAMINB12", "FOLATE", "FERRITIN", "TIBC", "IRON", "RETICCTPCT" in  the last 72 hours.  Sepsis Labs: Recent Labs  Lab 06/26/23 2053 06/26/23 2303 06/27/23 1735  WBC 18.5*  --  9.0  LATICACIDVEN  --  2.1*  --    Microbiology Recent Results (from the past 240 hour(s))  Blood culture (routine x 2)     Status: None   Collection Time: 06/26/23 11:03 PM   Specimen: BLOOD  Result Value Ref Range Status   Specimen Description   Final    BLOOD RIGHT ANTECUBITAL Performed at [facility], [address] [place], [place] [address]    Special Requests   Final    BOTTLES DRAWN AEROBIC AND ANAEROBIC Blood Culture adequate volume Performed at [facility], [address], [place], [place] [address]    Culture   Final    NO GROWTH 5  DAYS Performed at [facility], [address], [place], [place] [address]    Report Status 07/02/2023 FINAL  Final  Blood culture (routine x 2)     Status: None   Collection Time: 06/27/23 11:09 AM   Specimen: BLOOD LEFT ARM  Result Value Ref Range Status   Specimen Description   Final    BLOOD LEFT ARM Performed at [facility], [address] [place], [place] [address]    Special Requests   Final    BOTTLES DRAWN AEROBIC ONLY Blood Culture results may not be optimal due to an inadequate volume of blood received in culture bottles Performed at [facility], [address] [place], [place] [address]    Culture   Final    NO GROWTH 5 DAYS Performed at [facility], [address] [place], [place] [address]    Report Status 07/02/2023 FINAL  Final     Medications:    atorvastatin  40 mg Oral Daily   carvedilol  3.125 mg Oral BID   furosemide  40 mg Oral Daily   guaiFENesin  600 mg Oral BID   nicotine  14 mg Transdermal Daily   sodium chloride flush  3 mL Intravenous Q12H   umeclidinium bromide  1 puff Inhalation Daily   Continuous Infusions:  ampicillin-sulbactam (UNASYN) IV 3 g (07/02/23 0203)      LOS: 5 days   Marinda Elk  [facility]  07/02/2023, 10:11 AM

## 2023-07-03 ENCOUNTER — Inpatient Hospital Stay (HOSPITAL_COMMUNITY): Payer: Medicaid Other | Admitting: Anesthesiology

## 2023-07-03 ENCOUNTER — Encounter (HOSPITAL_COMMUNITY)
Admission: EM | Disposition: A | Discharge status: Home / Self Care | Payer: Self-pay | Source: Home / Self Care | Attending: Internal Medicine

## 2023-07-03 ENCOUNTER — Encounter (HOSPITAL_COMMUNITY): Payer: Self-pay | Admitting: Internal Medicine

## 2023-07-03 ENCOUNTER — Telehealth: Payer: Self-pay | Admitting: Internal Medicine

## 2023-07-03 DIAGNOSIS — J189 Pneumonia, unspecified organism: Secondary | ICD-10-CM | POA: Diagnosis not present

## 2023-07-03 HISTORY — PX: BRONCHIAL NEEDLE ASPIRATION BIOPSY: SHX5106

## 2023-07-03 HISTORY — PX: VIDEO BRONCHOSCOPY WITH ENDOBRONCHIAL ULTRASOUND: SHX6177

## 2023-07-03 HISTORY — PX: BRONCHIAL WASHINGS: SHX5105

## 2023-07-03 SURGERY — BRONCHOSCOPY, WITH EBUS
Anesthesia: General

## 2023-07-03 MED ORDER — ONDANSETRON HCL 4 MG/2ML IJ SOLN
INTRAMUSCULAR | Status: DC | PRN
  Administered 2023-07-03: 4 mg via INTRAVENOUS

## 2023-07-03 MED ORDER — ESMOLOL HCL 100 MG/10ML IV SOLN
INTRAVENOUS | Status: DC | PRN
  Administered 2023-07-03: 20 mg via INTRAVENOUS

## 2023-07-03 MED ORDER — ROCURONIUM BROMIDE 10 MG/ML (PF) SYRINGE
PREFILLED_SYRINGE | INTRAVENOUS | Status: DC | PRN
  Administered 2023-07-03: 50 mg via INTRAVENOUS

## 2023-07-03 MED ORDER — SUGAMMADEX SODIUM 200 MG/2ML IV SOLN
INTRAVENOUS | Status: DC | PRN
  Administered 2023-07-03: 200 mg via INTRAVENOUS

## 2023-07-03 MED ORDER — HYDRALAZINE HCL 20 MG/ML IJ SOLN
10.0000 mg | Freq: Four times a day (QID) | INTRAMUSCULAR | Status: DC | PRN
  Administered 2023-07-03: 10 mg via INTRAVENOUS
  Filled 2023-07-03: qty 1

## 2023-07-03 MED ORDER — HYDRALAZINE HCL 20 MG/ML IJ SOLN: 10.0000 mg | Freq: Four times a day (QID) | INTRAMUSCULAR | Status: DC | PRN

## 2023-07-03 MED ORDER — SODIUM CHLORIDE 0.9 % IV SOLN: INTRAVENOUS | Status: DC | PRN

## 2023-07-03 MED ORDER — PROPOFOL 10 MG/ML IV BOLUS
INTRAVENOUS | Status: DC | PRN
  Administered 2023-07-03: 50 mg via INTRAVENOUS
  Administered 2023-07-03: 150 ug/kg/min via INTRAVENOUS

## 2023-07-03 MED ORDER — DEXAMETHASONE SODIUM PHOSPHATE 10 MG/ML IJ SOLN
INTRAMUSCULAR | Status: DC | PRN
  Administered 2023-07-03: 10 mg via INTRAVENOUS

## 2023-07-03 MED ORDER — SODIUM CHLORIDE 0.9 % IV SOLN
INTRAVENOUS | Status: AC | PRN
  Administered 2023-07-03: 500 mL via INTRAMUSCULAR

## 2023-07-03 MED ORDER — LIDOCAINE 2% (20 MG/ML) 5 ML SYRINGE
INTRAMUSCULAR | Status: DC | PRN
  Administered 2023-07-03: 100 mg via INTRAVENOUS

## 2023-07-03 SURGICAL SUPPLY — 31 items
ADAPTER VALVE BIOPSY EBUS (MISCELLANEOUS) IMPLANT
BRUSH CYTOL CELLEBRITY 1.5X140 (MISCELLANEOUS) IMPLANT
CANISTER SUCT 3000ML PPV (MISCELLANEOUS) ×2 IMPLANT
CONT SPEC 4OZ CLIKSEAL STRL BL (MISCELLANEOUS) ×2 IMPLANT
COVER BACK TABLE 60X90IN (DRAPES) ×2 IMPLANT
FORCEPS BIOP RJ4 1.8 (CUTTING FORCEPS) IMPLANT
GAUZE SPONGE 4X4 12PLY STRL (GAUZE/BANDAGES/DRESSINGS) ×2 IMPLANT
GLOVE BIO SURGEON STRL SZ7.5 (GLOVE) ×2 IMPLANT
GOWN STRL REUS W/ TWL LRG LVL3 (GOWN DISPOSABLE) ×2 IMPLANT
GOWN STRL REUS W/ TWL XL LVL3 (GOWN DISPOSABLE) ×2 IMPLANT
KIT CLEAN ENDO COMPLIANCE (KITS) ×4 IMPLANT
KIT TURNOVER KIT B (KITS) ×2 IMPLANT
MARKER SKIN DUAL TIP RULER LAB (MISCELLANEOUS) ×2 IMPLANT
NDL ASPIRATION VIZISHOT 19G (NEEDLE) IMPLANT
NDL ASPIRATION VIZISHOT 21G (NEEDLE) IMPLANT
NEEDLE ASPIRATION VIZISHOT 19G (NEEDLE) IMPLANT
NEEDLE ASPIRATION VIZISHOT 21G (NEEDLE) IMPLANT
NS IRRIG 1000ML POUR BTL (IV SOLUTION) ×2 IMPLANT
OIL SILICONE PENTAX (PARTS (SERVICE/REPAIRS)) ×2 IMPLANT
PAD ARMBOARD 7.5X6 YLW CONV (MISCELLANEOUS) ×4 IMPLANT
SYR 20ML ECCENTRIC (SYRINGE) ×4 IMPLANT
SYR 20ML LL LF (SYRINGE) ×4 IMPLANT
SYR 50ML SLIP (SYRINGE) IMPLANT
SYR 5ML LUER SLIP (SYRINGE) ×2 IMPLANT
TOWEL GREEN STERILE FF (TOWEL DISPOSABLE) ×2 IMPLANT
TRAP SPECIMEN MUCOUS 40CC (MISCELLANEOUS) IMPLANT
TUBE CONNECTING 20X1/4 (TUBING) ×4 IMPLANT
UNDERPAD 30X30 (UNDERPADS AND DIAPERS) ×2 IMPLANT
VALVE BIOPSY SINGLE USE (MISCELLANEOUS) ×2 IMPLANT
VALVE SUCTION BRONCHIO DISP (MISCELLANEOUS) ×2 IMPLANT
WATER STERILE IRR 1000ML POUR (IV SOLUTION) ×2 IMPLANT

## 2023-07-03 NOTE — Op Note (Signed)
Flexible and EBUS Bronchoscopy Procedure Note  RACHAD CANNELLA  [id number]  03-21-1971  Date:07/03/23  Time:2:53 PM   Provider Performing:Glendon Fiser C Katrinka Blazing   Procedure: Flexible bronchoscopy and EBUS Bronchoscopy  Indication(s) Persistent pneumonia  Consent Risks of the procedure as well as the alternatives and risks of each were explained to the patient and/or caregiver.  Consent for the procedure was obtained.  Anesthesia General Anesthesia   Time Out Verified patient identification, verified procedure, site/side was marked, verified correct patient position, special equipment/implants available, medications/allergies/relevant history reviewed, required imaging and test results available.   Sterile Technique Usual hand hygiene, masks, gowns, and gloves were used   Procedure Description EBUS bronch brought into ETT and station 7 biopsied: sent for slide, culture, cell block. Station 7 looked enlarged but benign by [place] characteristics and slide. EBUS then removed and regular bronchoscope brought into airway. Blood at biopsy site suctioned. Then performed BAL RLL for culture/cyto Then performed BAL LLL for culture/cyto  Deferred fluoro and TBBx after review of imaging: would required either transthoracic FNA or ENB.  This can be done should he not respond to 4 weeks of confirmed abx therapy.  Complications/Tolerance None; patient tolerated the procedure well. Chest X-ray is not needed post procedure.   EBL Minimal   Specimen(s) Station 7: cell block, slides, culture RLL BAL: cyto, culture LLL BAL: cyto, culture

## 2023-07-03 NOTE — Anesthesia Postprocedure Evaluation (Signed)
Anesthesia Post Note  Patient: Topher Shirer Hesch  Procedure(s) Performed: CANCELLED PROCEDURE VIDEO BRONCHOSCOPY WITH ENDOBRONCHIAL ULTRASOUND BRONCHIAL WASHINGS BRONCHIAL NEEDLE ASPIRATION BIOPSIES     Patient location during evaluation: PACU Anesthesia Type: General Level of consciousness: awake and alert Pain management: pain level controlled Vital Signs Assessment: post-procedure vital signs reviewed and stable Respiratory status: spontaneous breathing, nonlabored ventilation and respiratory function stable Cardiovascular status: blood pressure returned to baseline and stable Postop Assessment: no apparent nausea or vomiting Anesthetic complications: no  No notable events documented.  Last Vitals:  Vitals:   07/03/23 1522 07/03/23 1530  BP: (!) 135/93 (!) 135/101  Pulse: 85 83  Resp: (!) 28 (!) 21  Temp:    SpO2: 98% 100%    Last Pain:  Vitals:   07/03/23 1530  TempSrc:   PainSc: 0-No pain                 Desirea Mizrahi,W. EDMOND

## 2023-07-03 NOTE — Progress Notes (Addendum)
Mobility Specialist Progress Note:    07/03/23 0923  Mobility  Activity Ambulated with assistance in hallway  Level of Assistance Contact guard assist, steadying assist  Assistive Device Cane  Distance Ambulated (ft) 100 ft  Activity Response Tolerated well  Mobility Referral Yes  Mobility visit 1 Mobility  Mobility Specialist Start Time (ACUTE ONLY) 0900  Mobility Specialist Stop Time (ACUTE ONLY) 0915  Mobility Specialist Time Calculation (min) (ACUTE ONLY) 15 min   Pt received in bed, agreeable to mobility, s/o at bedside. Ambulated in hallway via Cane and CGA. Tolerated well, asx throughout, SpO2 99% on RA. Returned pt to room, requested assistance to bathroom. Void successful. Returned pt to EOB, all needs met.   Feliciana Rossetti Mobility Specialist Please contact via Special educational needs teacher or  Rehab office at [phone]

## 2023-07-03 NOTE — Plan of Care (Signed)

## 2023-07-03 NOTE — Transfer of Care (Signed)
Immediate Anesthesia Transfer of Care Note  Patient: Craig Schmidt  Procedure(s) Performed: CANCELLED PROCEDURE VIDEO BRONCHOSCOPY WITH ENDOBRONCHIAL ULTRASOUND BRONCHIAL WASHINGS BRONCHIAL NEEDLE ASPIRATION BIOPSIES  Patient Location: PACU  Anesthesia Type:General  Level of Consciousness: awake, alert , and oriented  Airway & Oxygen Therapy: Patient Spontanous Breathing and Patient connected to face mask oxygen  Post-op Assessment: Report given to RN and Post -op Vital signs reviewed and stable  Post vital signs: Reviewed and stable  Last Vitals:  Vitals Value Taken Time  BP 126/98 07/03/23 1500  Temp 36.4 C 07/03/23 1500  Pulse 83 07/03/23 1500  Resp 24 07/03/23 1500  SpO2 100 % 07/03/23 1500  Vitals shown include unfiled device data.  Last Pain:  Vitals:   07/03/23 1500  TempSrc: Temporal  PainSc:          Complications: No notable events documented.

## 2023-07-03 NOTE — Plan of Care (Signed)

## 2023-07-03 NOTE — Progress Notes (Signed)
PROGRESS NOTE    TASHAN CRUZHERNANDEZ  NWG:[id number] DOB: [birth year] DOA: 06/26/2023 PCP: Rema Fendt, NP    Brief Narrative:  52 year old homeless, smoker, crack cocaine usually admitted with more than 1 month of dry cough, chills, pleurisy, 6 months of unintentional weight loss.  Has been in the hospital 5 times since last month.  He does have history of ischemic cardiomyopathy and severe mitral valve issues, CKD stage IIIa, history of a stroke and residual right upper extremity weakness.  He was at [facility] on broad-spectrum antibiotics.  Brought to [facility] for bronchoscopy and bronchoalveolar lavage due to on diagnostic cultures.  Subjective: Seen in the morning rounds.  Girlfriend at the bedside.  Feeling hungry.  Worried about his blood pressure of 140. Assessment & Plan:   Persistent multifocal pneumonia: Antibiotics to treat bacterial pneumonia.  Currently on Unasyn.  Followed by pulmonary. Chest physiotherapy, incentive spirometry, deep breathing exercises, sputum induction, mucolytic's and bronchodilators.  Mobilize in the hallway. Blood cultures was negative so far. Sputum cultures, not collected. Due to persistent symptoms and recurrent admission, scheduled for bronchoscopy and bronchoalveolar lavage today. Supplemental oxygen to keep saturations more than 90%.  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  On Coreg and Lasix.  Euvolemic. Coronary artery disease, on statin.  Plavix on hold. History of stroke with right hemiparesis, stable. Essential hypertension, blood pressure acceptable on Coreg and Lasix.  Crack cocaine use: Counseled to quit.    DVT prophylaxis: SCDs   Code Status: Full code Family Communication: Girlfriend at the bedside Disposition Plan: Status is: Inpatient Remains inpatient appropriate because: Inpatient procedures planned, IV antibiotics     Consultants:   Pulmonary  Procedures:  Bronchoscopy, planned  Antimicrobials:  Unasyn     Objective: Vitals:   07/03/23 0438 07/03/23 0448 07/03/23 0813 07/03/23 0952  BP: (!) 144/104  (!) 140/109 (!) 142/108  Pulse: 94  94 80  Resp: 18  (!) 22   Temp: 98 F (36.7 C)  98.3 F (36.8 C)   TempSrc: Oral  Oral   SpO2: 99%  100%   Weight:  66.8 kg    Height:        Intake/Output Summary (Last 24 hours) at 07/03/2023 1138 Last data filed at 07/02/2023 1856 Gross per 24 hour  Intake 480 ml  Output --  Net 480 ml   Filed Weights   06/28/23 0500 07/03/23 0448  Weight: 62.1 kg 66.8 kg    Examination:  General: Looks fairly comfortable.  Anxious. Cardiovascular: S1-S2 normal.  Regular rate rhythm. Respiratory: Poor air entry bilateral.  Poor inspiratory effort.  No added sounds. Gastrointestinal: Soft and nontender. Ext: No edema.    Data Reviewed: I have personally reviewed following labs and imaging studies  CBC: Recent Labs  Lab 06/26/23 2053 06/27/23 1735  WBC 18.5* 9.0  NEUTROABS 11.9*  --   HGB 11.4* 11.6*  HCT 36.6* 35.8*  MCV 92.7 91.6  PLT 411* 354   Basic Metabolic Panel: Recent Labs  Lab 06/26/23 2053 06/27/23 1109  NA 138 136  K 4.7 4.5  CL 103 102  CO2 21* 22  GLUCOSE 91 101*  BUN 27* 26*  CREATININE 1.29* 1.32*  CALCIUM 9.4 9.2   GFR: Estimated Creatinine Clearance: 62.6 mL/min (A) (by C-G formula based on SCr of 1.32 mg/dL (H)). Liver Function Tests: Recent Labs  Lab 06/26/23 2053  AST 29  ALT 11  ALKPHOS 143*  BILITOT  0.7  PROT 8.1  ALBUMIN 3.3*   No results for input(s): "LIPASE", "AMYLASE" in the last 168 hours. No results for input(s): "AMMONIA" in the last 168 hours. Coagulation Profile: No results for input(s): "INR", "PROTIME" in the last 168 hours. Cardiac Enzymes: Recent Labs  Lab 06/27/23 1734  CKTOTAL 30*   BNP (last 3 results) No results for input(s): "PROBNP" in the last 8760 hours. HbA1C: No results for  input(s): "HGBA1C" in the last 72 hours. CBG: No results for input(s): "GLUCAP" in the last 168 hours. Lipid Profile: No results for input(s): "CHOL", "HDL", "LDLCALC", "TRIG", "CHOLHDL", "LDLDIRECT" in the last 72 hours. Thyroid Function Tests: No results for input(s): "TSH", "T4TOTAL", "FREET4", "T3FREE", "THYROIDAB" in the last 72 hours. Anemia Panel: No results for input(s): "VITAMINB12", "FOLATE", "FERRITIN", "TIBC", "IRON", "RETICCTPCT" in the last 72 hours. Sepsis Labs: Recent Labs  Lab 06/26/23 2303  LATICACIDVEN 2.1*    Recent Results (from the past 240 hour(s))  Blood culture (routine x 2)     Status: None   Collection Time: 06/26/23 11:03 PM   Specimen: BLOOD  Result Value Ref Range Status   Specimen Description   Final    BLOOD RIGHT ANTECUBITAL Performed at [facility], [address], [place], [place] [address]    Special Requests   Final    BOTTLES DRAWN AEROBIC AND ANAEROBIC Blood Culture adequate volume Performed at [facility], [address] [place], [place] [address]    Culture   Final    NO GROWTH 5 DAYS Performed at [facility], [address] [place], [place] [address]    Report Status 07/02/2023 FINAL  Final  Blood culture (routine x 2)     Status: None   Collection Time: 06/27/23 11:09 AM   Specimen: BLOOD LEFT ARM  Result Value Ref Range Status   Specimen Description   Final    BLOOD LEFT ARM Performed at [facility], [address] [place], [place] [address]    Special Requests   Final    BOTTLES DRAWN AEROBIC ONLY Blood Culture results may not be optimal due to an inadequate volume of blood received in culture bottles Performed at [facility], [address] [place], [place] [address]    Culture   Final    NO GROWTH 5 DAYS Performed at [facility], [address], [place], [place] [address]    Report Status 07/02/2023 FINAL  Final         Radiology  Studies: No results found.      Scheduled Meds:  atorvastatin  40 mg Oral Daily   carvedilol  3.125 mg Oral BID   furosemide  40 mg Oral Daily   guaiFENesin  600 mg Oral BID   nicotine  14 mg Transdermal Daily   sodium chloride flush  3 mL Intravenous Q12H   umeclidinium bromide  1 puff Inhalation Daily   Continuous Infusions:  ampicillin-sulbactam (UNASYN) IV 3 g (07/03/23 0829)     LOS: 6 days    Time spent: 35 minutes    Dorcas Carrow, MD [facility]

## 2023-07-03 NOTE — Progress Notes (Signed)
PT Cancellation Note  Patient Details Name: Craig Schmidt MRN: [id number] DOB: 06-Jun-1971   Cancelled Treatment:    Reason Eval/Treat Not Completed: (P) Patient declined, no reason specified (Pt reports being NPO due to expected upcoming procedure and feeling nauseous. Will follow up as able.)   Johny Shock 07/03/2023, 12:50 PM

## 2023-07-03 NOTE — Anesthesia Procedure Notes (Signed)
Procedure Name: Intubation Date/Time: 07/03/2023 2:25 PM  Performed by: Pincus Large, CRNAPre-anesthesia Checklist: Patient identified, Emergency Drugs available, Suction available and Patient being monitored Patient Re-evaluated:Patient Re-evaluated prior to induction Oxygen Delivery Method: Circle System Utilized Preoxygenation: Pre-oxygenation with 100% oxygen Induction Type: IV induction Ventilation: Mask ventilation without difficulty Laryngoscope Size: Mac and 3 Grade View: Grade II Tube type: Oral Tube size: 8.0 mm Number of attempts: 1 Airway Equipment and Method: Stylet and Oral airway Placement Confirmation: ETT inserted through vocal cords under direct vision, positive ETCO2 and breath sounds checked- equal and bilateral Secured at: 23 cm Tube secured with: Tape Dental Injury: Teeth and Oropharynx as per pre-operative assessment

## 2023-07-03 NOTE — Interval H&P Note (Signed)
07/03/2023 No changes Agrees to bronch understanding risk/benefits.  Myrla Halsted MD PCCM

## 2023-07-04 ENCOUNTER — Encounter (HOSPITAL_COMMUNITY): Payer: Self-pay | Admitting: Internal Medicine

## 2023-07-04 DIAGNOSIS — J189 Pneumonia, unspecified organism: Secondary | ICD-10-CM | POA: Diagnosis not present

## 2023-07-04 LAB — CYTOLOGY - NON PAP

## 2023-07-04 NOTE — Progress Notes (Signed)
07/04/2023  Lynelle Doctor Reaney DOB: [birth year] MRN: [id number]   RIDER WAIVER AND RELEASE OF LIABILITY  For the purposes of helping with transportation needs, [facility] partners with outside transportation providers (taxi companies, [place], Catering manager.) to give [employer] patients or other approved people the choice of on-demand rides [employer]") to our buildings for non-emergency visits.  By using [employer], I, the person signing this document, on behalf of myself and/or any legal minors (in my care using the [employer]), agree:  Science writer given to me are supplied by independent, outside transportation providers who do not work for, or have any affiliation with, [employer]. [employer] is not a transportation company. [facility] has no control over the quality or safety of the rides I get using [employer]. [facility] has no control over whether any outside ride will happen on time or not. [facility] gives no guarantee on the reliability, quality, safety, or availability on any rides, or that no mistakes will happen. I know and accept that traveling by vehicle (car, truck, SVU, Zenaida Niece, bus, taxi, etc.) has risks of serious injuries such as disability, being paralyzed, and death. I know and agree the risk of using [employer] is mine alone, and not [employer]. Transport Services are provided "as is" and as are available. The transportation providers are in charge for all inspections and care of the vehicles used to provide these rides. I agree not to take legal action against [facility], its agents, employees, officers, directors, representatives, insurers, attorneys, assigns, successors, subsidiaries, and affiliates at any time for any reasons related directly or indirectly to using [employer]. I also agree not to take legal action against [facility] or its affiliates for any injury, death, or damage to property caused by or related to using  [employer]. I have read this Waiver and Release of Liability, and I understand the terms used in it and their legal meaning. This Waiver is freely and voluntarily given with the understanding that my right (or any legal minors) to legal action against  relating to [employer] is knowingly given up to use these services.   I attest that I read the Ride Waiver and Release of Liability to Sharol Roussel, gave Mr. Cantera the opportunity to ask questions and answered the questions asked (if any). I affirm that Kaique Cartwright Shaffer then provided consent for assistance with transportation.

## 2023-07-04 NOTE — TOC Progression Note (Signed)
Transition of Care [facility]) - Progression Note    Patient Details  Name: Craig Schmidt MRN: [id number] Date of Birth: Mar 09, 1971  Transition of Care [facility]) CM/SW Contact  Herny Scurlock A [place], [place] Phone Number: 07/04/2023, 10:36 AM  Clinical Narrative:     CSW provided pt with taxi voucher to assist with transportation at discharge.   No other needs identified at this time. TOC will sign off, please consult again if TOC needs arise.    Expected Discharge Plan: Homeless Shelter Barriers to Discharge: No Barriers Identified  Expected Discharge Plan and Services In-house Referral: Clinical Social Work Discharge Planning Services: NA Post Acute Care Choice: NA Living arrangements for the past 2 months: Homeless Expected Discharge Date: 07/04/23               DME Arranged: N/A DME Agency: NA                   Social Determinants of Health (SDOH) Interventions SDOH Screenings   Food Insecurity: Food Insecurity Present (06/27/2023)  Housing: High Risk (06/27/2023)  Transportation Needs: Unmet Transportation Needs (06/27/2023)  Utilities: At Risk (06/27/2023)  Alcohol Screen: High Risk (09/19/2021)  Depression (PHQ2-9): High Risk (06/13/2022)  Financial Resource Strain: High Risk (09/07/2022)  Stress: Stress Concern Present (10/16/2022)  Tobacco Use: High Risk (07/03/2023)    Readmission Risk Interventions    06/28/2023    1:27 PM 06/21/2023   12:50 PM  Readmission Risk Prevention Plan  Transportation Screening Complete Complete  PCP or Specialist Appt within 3-5 Days Complete   HRI or Home Care Consult Complete   Social Work Consult for Recovery Care Planning/Counseling Complete   Palliative Care Screening Not Applicable   Medication Review [employer]) Complete Complete  PCP or Specialist appointment within 3-5 days of discharge  Complete  HRI or Home Care Consult  Complete  SW Recovery Care/Counseling Consult  Complete  Palliative Care Screening  Not Applicable   Skilled Nursing Facility  Not Applicable

## 2023-07-04 NOTE — Discharge Summary (Signed)
Physician Discharge Summary  ERSEL KLEEMAN UJW:[id number] DOB: [birth year] DOA: 06/26/2023  PCP: Rema Fendt, NP  Admit date: 06/26/2023 Discharge date: 07/04/2023  Admitted From: Homeless Disposition: Home, homeless shelter  Recommendations for Outpatient Follow-up:  Follow up with PCP in 1-2 weeks Pulmonary to schedule follow-up   Discharge Condition: Stable CODE STATUS: Full code Diet recommendation: Low-salt diet, no cocaine.  Discharge summary: 52 year old homeless, smoker, crack cocaine user  admitted with more than 1 month of dry cough, chills, pleurisy, 6 months of unintentional weight loss. Has been in the hospital 5 times since last month. He does have history of ischemic cardiomyopathy and severe mitral valve issues, CKD stage IIIa, history of a stroke and residual right upper extremity weakness. He was at [facility] on broad-spectrum antibiotics. Brought to [facility] for bronchoscopy and bronchoalveolar lavage due to non diagnostic cultures.   Persistent multifocal pneumonia:  Patient was recently admitted and discharged on 4 weeks of Augmentin.  Came back immediately to the hospital.  Treated with Unasyn.  Underwent bronchoscopy and bronchoalveolar lavage.  Results are pending.  Clinically improving.  On room air. Pending microbiology results, discharging home with Augmentin.  He is already on 4 weeks course of Augmentin that he will continue.  He was advised to continue to do chest physical therapy at home.  Patient is currently on room air.  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  On Coreg and Lasix.  Euvolemic. Coronary artery disease, on statin.  Resume Plavix. History of stroke with right hemiparesis, stable. Essential hypertension, blood pressure acceptable on Coreg and Lasix.   Crack cocaine use: Counseled to quit.  Discharge Diagnoses:  Principal Problem:   Multifocal pneumonia Active  Problems:   Chronic combined systolic and diastolic CHF (congestive heart failure) (HCC)   Coronary artery disease   Essential hypertension   Hyperlipidemia   History of CVA (cerebrovascular accident)   Substance use disorder    Discharge Instructions  Discharge Instructions     Diet - low sodium heart healthy   Complete by: As directed    Increase activity slowly   Complete by: As directed       Allergies as of 07/04/2023       Reactions   Shellfish Allergy Anaphylaxis   Peanut (diagnostic) Itching        Medication List     TAKE these medications    amoxicillin-clavulanate 875-125 MG tablet Commonly known as: AUGMENTIN Take 1 tablet by mouth every 12 (twelve) hours for 28 days.   atorvastatin 40 MG tablet Commonly known as: LIPITOR Take 1 tablet (40 mg total) by mouth daily.   carvedilol 3.125 MG tablet Commonly known as: COREG Take 1 tablet (3.125 mg total) by mouth 2 (two) times daily.   clopidogrel 75 MG tablet Commonly known as: PLAVIX Take 1 tablet (75 mg total) by mouth daily.   furosemide 40 MG tablet Commonly known as: LASIX Take 1 tablet (40 mg total) by mouth daily. What changed: how much to take   Incruse Ellipta 62.5 MCG/ACT Aepb Generic drug: umeclidinium bromide Inhale 1 puff into the lungs daily.        Allergies  Allergen Reactions   Shellfish Allergy Anaphylaxis   Peanut (Diagnostic) Itching    Consultations: Pulmonary   Procedures/Studies: DG Swallowing Func-Speech Pathology Result Date: 06/27/2023 Table formatting from the original result was not included. Modified Barium Swallow Study Patient Details Name: TRAMELL PINKETT MRN: [id number]  Date of Birth: Jan 20, 1971 Today's Date: 06/27/2023 HPI/PMH: HPI: 52 year old homeless man with addiction to cigarettes and crack cocaine presenting with >1 month dry cough, chills, pleurisy with background of 6 months unintentional weight loss.  Per chart pt has been to the hospital 5  times for this issue since 05/25/23. CXR Mild bibasilar infiltrates. PMH: stroke 2023, CAD, CKD, brain tumor, ETOH abuse. BSE 11/30/22 no signs of aspiration in reclined position due to pain, denied dysphagia and unlikely to suffer from silent aspiration and denied reflux or regurgitation. Recommended to continue regular diet, thin liquids. MBS to assess oropharyngeal swallow given multiple admissions for pna over past year, history of stroke and pharyngeal globus sensation. Clinical Impression: Clinical Impression: Pt demonstrated a normal oropharyngeal sequence with adequate oral control, bolus cohesion, mastication and complete oral clearance. His laryngeal ROM and strength were within normal range for hyolaryngeal excursion, epiglottic inversion and laryngeal closure which prevented penetration and aspiration. Swallow integrity was challenged with multiple/sequential cup sips with thin and use of straw. Pt reported pharyngeal globus sensation although there was no pharyngeal residue observed and scan of esophagus revealed complete clearance. Pt has history of ETOH abuse and therapist suspects he may have occasional esophageal irritation or undiagnosed involvement contributing to pharyngeal globus sensation. Recommend he continue regular texture, thin liquids, pills with thin and no further ST is needed at this time. Factors that may increase risk of adverse event in presence of aspiration Rubye Oaks & Clearance Coots 2021): No data recorded Recommendations/Plan: Swallowing Evaluation Recommendations Swallowing Evaluation Recommendations Recommendations: PO diet PO Diet Recommendation: Regular; Thin liquids (Level 0) Liquid Administration via: Cup; Straw Medication Administration: Whole meds with liquid Supervision: Staff to assist with self-feeding; Other (comment) (limited use of right hand after stroke) Postural changes: Position pt fully upright for meals Oral care recommendations: Oral care BID (2x/day) Treatment Plan  Treatment Plan Treatment recommendations: No treatment recommended at this time Follow-up recommendations: No SLP follow up Functional status assessment: Patient has not had a recent decline in their functional status. Recommendations Recommendations for follow up therapy are one component of a multi-disciplinary discharge planning process, led by the attending physician.  Recommendations may be updated based on patient status, additional functional criteria and insurance authorization. Assessment: Orofacial Exam: Orofacial Exam Oral Cavity: Oral Hygiene: WFL Oral Cavity - Dentition: Other (Comment) (missing 1-2 upper but majority intact) Orofacial Anatomy: WFL Oral Motor/Sensory Function: WFL Anatomy: Anatomy: WFL Boluses Administered: Boluses Administered Boluses Administered: Thin liquids (Level 0); Mildly thick liquids (Level 2, nectar thick); Moderately thick liquids (Level 3, honey thick); Puree; Solid  Oral Impairment Domain: Oral Impairment Domain Lip Closure: No labial escape Tongue control during bolus hold: Cohesive bolus between tongue to palatal seal Bolus preparation/mastication: Timely and efficient chewing and mashing Bolus transport/lingual motion: Brisk tongue motion Oral residue: Complete oral clearance Location of oral residue : N/A Initiation of pharyngeal swallow : Valleculae  Pharyngeal Impairment Domain: Pharyngeal Impairment Domain Soft palate elevation: No bolus between soft palate (SP)/pharyngeal wall (PW) Laryngeal elevation: Complete superior movement of thyroid cartilage with complete approximation of arytenoids to epiglottic petiole Anterior hyoid excursion: Complete anterior movement Epiglottic movement: Complete inversion Laryngeal vestibule closure: Complete, no air/contrast in laryngeal vestibule Pharyngeal stripping wave : Present - complete Pharyngeal contraction (A/P view only): N/A Pharyngoesophageal segment opening: Complete distension and complete duration, no obstruction of  flow Tongue base retraction: No contrast between tongue base and posterior pharyngeal wall (PPW) Pharyngeal residue: Complete pharyngeal clearance Location of pharyngeal residue: N/A  Esophageal Impairment Domain: Esophageal Impairment Domain Esophageal clearance upright position: Complete clearance, esophageal coating Pill: No data recorded Penetration/Aspiration Scale Score: Penetration/Aspiration Scale Score 1.  Material does not enter airway: Thin liquids (Level 0); Mildly thick liquids (Level 2, nectar thick); Moderately thick liquids (Level 3, honey thick); Puree; Solid Compensatory Strategies: Compensatory Strategies Compensatory strategies: -- (N/A)   General Information: Caregiver present: No  Diet Prior to this Study: Regular; Thin liquids (Level 0)   Temperature : Normal   Respiratory Status: WFL   Supplemental O2: None (Room air)   History of Recent Intubation: No  Behavior/Cognition: Alert; Cooperative; Pleasant mood Self-Feeding Abilities: Able to self-feed; Other (Comment) (held cup with left hand) Baseline vocal quality/speech: Normal Volitional Cough: Able to elicit Volitional Swallow: Able to elicit Exam Limitations: No limitations Goal Planning: No data recorded No data recorded No data recorded No data recorded Consulted and agree with results and recommendations: Patient; Nurse; Physician Pain: Pain Assessment Pain Assessment: Faces Faces Pain Scale: 0 Breathing: 0 Negative Vocalization: 0 Facial Expression: 0 Body Language: 0 Consolability: 0 PAINAD Score: 0 Pain Location: "around ribs when he coughs" End of Session: Start Time:SLP Start Time (ACUTE ONLY): 1443 Stop Time: SLP Stop Time (ACUTE ONLY): 1455 Time Calculation:SLP Time Calculation (min) (ACUTE ONLY): 12 min Charges: SLP Evaluations $ SLP Speech Visit: 1 Visit SLP Evaluations $BSS Swallow: 1 Procedure $MBS Swallow: 1 Procedure SLP visit diagnosis: SLP Visit Diagnosis: Dysphagia, unspecified (R13.10) Past Medical History: Past Medical  History: Diagnosis Date  Alcohol abuse   Asthma   Brain tumor (HCC)   CAD (coronary artery disease)   Chronic HFrEF (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)   Chronic kidney disease, stage 3 (HCC)   Cocaine abuse (HCC)   History of medication noncompliance   Homelessness   MI (myocardial infarction) (HCC)   STEMI (ST elevation myocardial infarction) (HCC)   Stroke [facility])  Past Surgical History: Past Surgical History: Procedure Laterality Date  BUBBLE STUDY  09/11/2021  Procedure: BUBBLE STUDY;  Surgeon: Pricilla Riffle, MD;  Location: [facility] ENDOSCOPY;  Service: Cardiovascular;;  LEFT HEART CATH AND CORONARY ANGIOGRAPHY N/A 12/19/2021  Procedure: LEFT HEART CATH AND CORONARY ANGIOGRAPHY;  Surgeon: Lyn Records, MD;  Location: [facility] INVASIVE CV LAB;  Service: Cardiovascular;  Laterality: N/A;  RIGHT/LEFT HEART CATH AND CORONARY ANGIOGRAPHY N/A 04/13/2022  Procedure: RIGHT/LEFT HEART CATH AND CORONARY ANGIOGRAPHY;  Surgeon: Laurey Morale, MD;  Location: [facility] INVASIVE CV LAB;  Service: Cardiovascular;  Laterality: N/A;  TEE WITHOUT CARDIOVERSION N/A 09/11/2021  Procedure: TRANSESOPHAGEAL ECHOCARDIOGRAM (TEE);  Surgeon: Pricilla Riffle, MD;  Location: [facility] ENDOSCOPY;  Service: Cardiovascular;  Laterality: N/A; Royce Macadamia 06/27/2023, 3:32 PM  ECHOCARDIOGRAM COMPLETE Result Date: 06/27/2023    ECHOCARDIOGRAM REPORT   Patient Name:   KADON RAUCH Date of Exam: 06/27/2023 Medical Rec #:  [id number]          Height:       67.0 in Accession #:    [id number]         Weight:       137.3 lb Date of Birth:  Jan 27, 1971           BSA:          1.724 m Patient Age:    52 years           BP:           130/93 mmHg Patient Gender: M  HR:           93 bpm. Exam Location:  Inpatient Procedure: 2D Echo, Cardiac Doppler and Color Doppler Indications:    Endocarditits I38  History:        Patient has prior history of Echocardiogram examinations, most                 recent 05/27/2023. CHF and Cardiomyopathy, CAD  and Acute MI,                 Stroke; Risk Factors:Hypertension, Current Smoker and                 Dyslipidemia.  Sonographer:    Lucendia Herrlich RCS Referring Phys: [id number] Vinnie Level SMITH IMPRESSIONS  1. Left ventricular ejection fraction, by estimation, is <20%. The left ventricle has severely decreased function. The left ventricle demonstrates global hypokinesis. The left ventricular internal cavity size was mildly to moderately dilated. Left ventricular diastolic parameters are consistent with Grade II diastolic dysfunction (pseudonormalization).  2. Right ventricular systolic function is moderately reduced. The right ventricular size is mildly enlarged.  3. Left atrial size was severely dilated.  4. Right atrial size was mildly dilated.  5. The mitral valve is grossly normal. There is mild thickening of the mitral valve with restricted excursion of valve leaflets. Moderate mitral stenosis.  6. The aortic valve is normal in structure. Aortic valve regurgitation is trivial.  7. No overt signs of endocarditis.  8. The inferior vena cava is normal in size with greater than 50% respiratory variability, suggesting right atrial pressure of 3 mmHg. FINDINGS  Left Ventricle: Left ventricular ejection fraction, by estimation, is <20%. The left ventricle has severely decreased function. The left ventricle demonstrates global hypokinesis. The left ventricular internal cavity size was mildly to moderately dilated. There is no left ventricular hypertrophy. Left ventricular diastolic parameters are consistent with Grade II diastolic dysfunction (pseudonormalization). Right Ventricle: The right ventricular size is mildly enlarged. No increase in right ventricular wall thickness. Right ventricular systolic function is moderately reduced. Left Atrium: Left atrial size was severely dilated. Right Atrium: Right atrial size was mildly dilated. Pericardium: There is no evidence of pericardial effusion. Mitral Valve: The mitral  valve is normal in structure. There is mild thickening of the mitral valve leaflet(s). Mild mitral valve regurgitation. Tricuspid Valve: The tricuspid valve is normal in structure. Tricuspid valve regurgitation is trivial. Aortic Valve: The aortic valve is normal in structure. Aortic valve regurgitation is trivial. Aortic valve peak gradient measures 4.8 mmHg. Pulmonic Valve: The pulmonic valve was normal in structure. Pulmonic valve regurgitation is trivial. Aorta: The aortic root and ascending aorta are structurally normal, with no evidence of dilitation. Venous: The inferior vena cava is normal in size with greater than 50% respiratory variability, suggesting right atrial pressure of 3 mmHg. IAS/Shunts: No atrial level shunt detected by color flow Doppler.  LEFT VENTRICLE PLAX 2D LVIDd:         5.70 cm      Diastology LVIDs:         5.10 cm      LV e' medial:    5.27 cm/s LV PW:         0.90 cm      LV E/e' medial:  31.7 LV IVS:        0.70 cm      LV e' lateral:   5.27 cm/s LVOT diam:     2.00 cm      LV  E/e' lateral: 31.7 LV SV:         35 LV SV Index:   20 LVOT Area:     3.14 cm                              3D Volume EF: LV Volumes (MOD)            3D EF:        24 % LV vol d, MOD A2C: 190.0 ml LV EDV:       251 ml LV vol d, MOD A4C: 181.0 ml LV ESV:       190 ml LV vol s, MOD A2C: 159.0 ml LV SV:        61 ml LV vol s, MOD A4C: 141.0 ml LV SV MOD A2C:     31.0 ml LV SV MOD A4C:     181.0 ml LV SV MOD BP:      36.9 ml RIGHT VENTRICLE            IVC RV S prime:     7.23 cm/s  IVC diam: 1.80 cm TAPSE (M-mode): 1.2 cm LEFT ATRIUM             Index        RIGHT ATRIUM           Index LA diam:        4.90 cm 2.84 cm/m   RA Area:     16.10 cm LA Vol (A2C):   82.1 ml 47.63 ml/m  RA Volume:   43.90 ml  25.47 ml/m LA Vol (A4C):   86.1 ml 49.95 ml/m LA Biplane Vol: 92.9 ml 53.89 ml/m  AORTIC VALVE                 PULMONIC VALVE AV Area (Vmax): 2.16 cm     PR End Diast Vel: 8.07 msec AV Vmax:        110.00 cm/s  AV Peak Grad:   4.8 mmHg LVOT Vmax:      75.50 cm/s LVOT Vmean:     47.000 cm/s LVOT VTI:       0.112 m  AORTA Ao Root diam: 3.10 cm Ao Asc diam:  2.50 cm MITRAL VALVE                TRICUSPID VALVE MV Area (PHT): 4.29 cm     TR Peak grad:   45.2 mmHg MV Decel Time: 177 msec     TR Vmax:        336.00 cm/s MR Peak grad: 101.6 mmHg MR Vmax:      504.00 cm/s   SHUNTS MV E velocity: 167.00 cm/s  Systemic VTI:  0.11 m MV A velocity: 119.00 cm/s  Systemic Diam: 2.00 cm MV E/A ratio:  1.40 Aditya Sabharwal Electronically signed by Dorthula Nettles Signature Date/Time: 06/27/2023/1:34:57 PM    Final    CT Angio Chest PE W/Cm &/Or Wo Cm Result Date: 06/27/2023 CLINICAL DATA:  Shortness of breath. EXAM: CT ANGIOGRAPHY CHEST WITH CONTRAST TECHNIQUE: Multidetector CT imaging of the chest was performed using the standard protocol during bolus administration of intravenous contrast. Multiplanar CT image reconstructions and MIPs were obtained to evaluate the vascular anatomy. RADIATION DOSE REDUCTION: This exam was performed according to the departmental dose-optimization program which includes automated exposure control, adjustment of the mA and/or kV according to patient size and/or use of iterative reconstruction technique.  CONTRAST:  80mL OMNIPAQUE IOHEXOL 350 MG/ML SOLN COMPARISON:  June 19, 2023 FINDINGS: Cardiovascular: The thoracic aorta is normal in appearance. Satisfactory opacification of the pulmonary arteries to the segmental level. No evidence of pulmonary embolism. There is stable moderate severity cardiomegaly. No pericardial effusion. Mediastinum/Nodes: No enlarged mediastinal, hilar, or axillary lymph nodes. Thyroid gland, trachea, and esophagus demonstrate no significant findings. Lungs/Pleura: A stable 8 mm noncalcified lung nodule is seen within the anterolateral aspect of the right upper lobe (axial CT image 52, CT series 12). Stable ground-glass appearing areas of lung parenchyma are seen  bilaterally. Mild lingular and mild anterolateral right middle lobe atelectasis and/or infiltrate is seen. Stable moderate severity areas of consolidation are noted within the anterolateral aspects of the bilateral lower lobes and posteromedial aspect of the right lower lobe. No pleural effusion or pneumothorax is identified. Upper Abdomen: No acute abnormality. Musculoskeletal: No chest wall abnormality. No acute or significant osseous findings. Review of the MIP images confirms the above findings. IMPRESSION: 1. No evidence of pulmonary embolism. 2. Stable 8 mm noncalcified right upper lobe lung nodule. Non-contrast chest CT at 6-12 months is recommended. If the nodule is stable at time of repeat CT, then future CT at 18-24 months (from today's scan) is considered optional for low-risk patients, but is recommended for high-risk patients. This recommendation follows the consensus statement: Guidelines for Management of Incidental Pulmonary Nodules Detected on CT Images: From the Fleischner Society 2017; Radiology 2017; 284:228-243. 3. Stable moderate severity areas of consolidation within the bilateral lower lobes and right middle lobe, likely infectious in origin. Follow-up to resolution is recommended to exclude the presence of an underlying neoplastic process. 4. Stable ground-glass appearing areas of lung parenchyma bilaterally which may represent sequelae associated with pulmonary edema or and inflammatory process such as pneumonitis. Electronically Signed   By: Aram Candela M.D.   On: 06/27/2023 00:14   DG Chest 2 View Result Date: 06/26/2023 CLINICAL DATA:  Chest pain and shortness of breath. EXAM: CHEST - 2 VIEW COMPARISON:  June 19, 2023 FINDINGS: The cardiac silhouette is mildly enlarged and unchanged in size. Mild, predominant stable patchy infiltrates are seen within the bilateral lung bases. No pleural effusion or pneumothorax is identified. The visualized skeletal structures are  unremarkable. IMPRESSION: Mild bibasilar infiltrates. Electronically Signed   By: Aram Candela M.D.   On: 06/26/2023 21:22   CT Angio Chest PE W and/or Wo Contrast Result Date: 06/19/2023 CLINICAL DATA:  Pulmonary embolism suspected. High probability, with shortness of breath. EXAM: CT ANGIOGRAPHY CHEST WITH CONTRAST TECHNIQUE: Multidetector CT imaging of the chest was performed using the standard protocol during bolus administration of intravenous contrast. Multiplanar CT image reconstructions and MIPs were obtained to evaluate the vascular anatomy. RADIATION DOSE REDUCTION: This exam was performed according to the departmental dose-optimization program which includes automated exposure control, adjustment of the mA and/or kV according to patient size and/or use of iterative reconstruction technique. CONTRAST:  75mL OMNIPAQUE IOHEXOL 350 MG/ML SOLN COMPARISON:  AP Lat chest today, CTA chest 06/17/2023, CTA chest 06/04/2023. FINDINGS: Cardiovascular: There is moderate panchamber cardiomegaly with minimal pericardial effusion again noted. Once again there is IVC and hepatic vein contrast reflux most likely indicating ongoing right heart dysfunction versus tricuspid regurgitation. The pulmonary trunk is slightly prominent, as before, without evidence of arterial embolism. The aorta and great vessels are unremarkable. There are no visible coronary calcifications. Central pulmonary veins are distended but no more than previously. Mediastinum/Nodes: Subcarinal nodal mass again measures 2.3  cm in short axis. Less enlarged lymph nodes at the AP window up to 1.2 cm in short axis. There scattered slightly prominent hilar lymph nodes bilaterally. The thyroid gland, thoracic trachea, thoracic esophagus are unremarkable. No new or progressive adenopathy.  Axillary spaces are clear. Lungs/Pleura: Persistent diffuse bronchial thickening. Similar appearance interlobular septal thickening in the lung bases consistent with  mild interstitial edema. Small right and minimal left layering pleural effusions are similar. Consolidation continues to be noted in the anterior and posterior basal segments of the right lower lobe and lateral basal segment of the left lower lobe. The left lower lobe opacity appears more hypodense which suggest liquefaction necrosis. There is improved atelectasis in the lingular base. Persisting mosaicism of the lung fields which is either due to small airways disease with air trapping or ground-glass edema. There are paraseptal emphysematous changes in the upper lobes. Scattered linear scarring or atelectasis in the bases. There is no new infiltrate. Upper Abdomen: No acute abnormality.  Hepatic steatosis. Musculoskeletal: There is thoracic kyphodextroscoliosis and mild degenerative changes. No acute or significant osseous findings are otherwise seen. Review of the MIP images confirms the above findings. IMPRESSION: 1. No evidence of arterial embolism. Slight prominence of the pulmonary trunk. 2. Cardiomegaly with minimal pericardial effusion, IVC and hepatic vein contrast reflux the latter most likely indicating ongoing right heart dysfunction versus tricuspid regurgitation. 3. Persisting interlobular septal thickening in the lung bases consistent with mild interstitial edema. 4. Small right and minimal left layering pleural effusions are similar. 5. Consolidation in the anterior and posterior basal segments of the right lower lobe and lateral basal segment of the left lower lobe. The left lower lobe opacity appears more hypodense which suggests liquefaction necrosis. 6. Persisting mosaicism of the lung fields which is either due to small airways disease with air trapping or ground-glass edema. 7. Emphysema. Redemonstrated bronchial thickening which could be bronchitis or congestive. 8. Stable mediastinal and hilar adenopathy. 9. Hepatic steatosis. Emphysema (ICD10-J43.9). Electronically Signed   By: Almira Bar M.D.   On: 06/19/2023 21:13   DG Chest 2 View Result Date: 06/19/2023 CLINICAL DATA:  Chest pain and weakness. EXAM: CHEST - 2 VIEW COMPARISON:  06/16/2023 FINDINGS: Moderate cardiomegaly again demonstrated. Tiny bilateral pleural effusions are noted. Increased airspace opacity seen in the right lower lobe, suspicious for pneumonia. IMPRESSION: Increased right lower lobe airspace opacity, suspicious for pneumonia. Tiny bilateral pleural effusions. Stable cardiomegaly. Electronically Signed   By: Danae Orleans M.D.   On: 06/19/2023 16:53   CT Angio Chest/Abd/Pel for Dissection W and/or W/WO Result Date: 06/17/2023 CLINICAL DATA:  Acute aortic syndrome suspected. Low back pain and cough. Pain with breathing. EXAM: CT ANGIOGRAPHY CHEST, ABDOMEN AND PELVIS TECHNIQUE: Non-contrast CT of the chest was initially obtained. Multidetector CT imaging through the chest, abdomen and pelvis was performed using the standard protocol during bolus administration of intravenous contrast. Multiplanar reconstructed images and MIPs were obtained and reviewed to evaluate the vascular anatomy. RADIATION DOSE REDUCTION: This exam was performed according to the departmental dose-optimization program which includes automated exposure control, adjustment of the mA and/or kV according to patient size and/or use of iterative reconstruction technique. CONTRAST:  OMNIPAQUE IOHEXOL 350 MG/ML SOLN COMPARISON:  06/12/2023. FINDINGS: CTA CHEST FINDINGS Cardiovascular: The heart is enlarged and there is a trace pericardial effusion. The aorta and pulmonary trunk are normal in caliber. No evidence of dissection is seen. The great vessels appear patent. Mediastinum/Nodes: Enlarged lymph nodes are present in the mediastinum  measuring 1 cm at the AP window and 2.3 cm in the subcarinal space, likely reactive. No hilar or axillary lymphadenopathy is seen. The thyroid gland, trachea, and esophagus are within normal limits. Lungs/Pleura:  Last hazy ground-glass attenuation is noted in the lungs bilaterally, slightly improved from the prior exam. There is bronchial wall thickening bilaterally with strandy opacities at the lung bases. Consolidation is noted in the anterior aspect and posterior portion of the right lower lobe and lateral aspect of the left lower lobe, improved from the prior exam. There is a small right pleural effusion and a trace left pleural effusion. No pneumothorax is seen. Musculoskeletal: Degenerative changes are present in the thoracic spine. No acute osseous abnormality. Review of the MIP images confirms the above findings. CTA ABDOMEN AND PELVIS FINDINGS VASCULAR Aorta: Normal caliber aorta without aneurysm, dissection, vasculitis or significant stenosis. Aortic atherosclerosis. Celiac: Patent without evidence of aneurysm, dissection, vasculitis or significant stenosis. SMA: Patent without evidence of aneurysm, dissection, vasculitis or significant stenosis. Renals: Both renal arteries are patent without evidence of aneurysm, dissection, vasculitis, fibromuscular dysplasia or significant stenosis. IMA: Patent. Inflow: Patent without evidence of aneurysm, dissection, vasculitis or significant stenosis. Veins: No obvious venous abnormality within the limitations of this arterial phase study. Review of the MIP images confirms the above findings. NON-VASCULAR Hepatobiliary: No focal liver abnormality is seen. No gallstones, gallbladder wall thickening, or biliary dilatation. Pancreas: Unremarkable. No pancreatic ductal dilatation or surrounding inflammatory changes. Spleen: Normal in size without focal abnormality. Adrenals/Urinary Tract: The adrenal glands are within normal limits. The kidneys enhance symmetrically. No renal calculus or hydronephrosis. The bladder is unremarkable. Stomach/Bowel: Stomach is within normal limits. Appendix is not seen. No evidence of bowel wall thickening, distention, or inflammatory changes. No  free air or pneumatosis. A moderate amount of retained stool is present in the colon. Lymphatic: No abdominal or pelvic lymphadenopathy. Reproductive: Prostate is unremarkable. Other: No abdominopelvic ascites. Musculoskeletal: A pars defect is noted at L5 on the left. Mild degenerative changes are noted in the lumbar spine. No acute osseous abnormality. Review of the MIP images confirms the above findings. IMPRESSION: 1. Aortic atherosclerosis without evidence of aneurysm or dissection. 2. Ground-glass attenuation in the lungs, bronchial wall thickening, strandy airspace opacities, and bilateral lower lobe consolidation, improved from the previous exam. 3. Small bilateral pleural effusions. 4. Moderate amount of retained stool in the colon suggesting constipation. 5. Cardiomegaly. Electronically Signed   By: Thornell Sartorius M.D.   On: 06/17/2023 02:30   DG Chest Port 1 View Result Date: 06/16/2023 CLINICAL DATA:  Chest pain short of breath EXAM: PORTABLE CHEST 1 VIEW COMPARISON:  06/12/2023, CT 06/04/2023 FINDINGS: Cardiomegaly with small right pleural effusion. Mild right peripheral lung base airspace opacity. No pneumothorax. IMPRESSION: Cardiomegaly with small right pleural effusion. Mild right peripheral lung base airspace opacity/pneumonia without significant change. Electronically Signed   By: Jasmine Pang M.D.   On: 06/16/2023 23:56   DG Chest 2 View Result Date: 06/12/2023 CLINICAL DATA:  Follow-up multifocal pneumonia EXAM: CHEST - 2 VIEW COMPARISON:  06/10/2023 FINDINGS: Cardiac shadow is enlarged. Patchy airspace opacity is noted in the lateral right lung base. Small effusions are seen bilaterally. No other focal abnormality is noted. IMPRESSION: Small effusions and mild right basilar infiltrate. Electronically Signed   By: Alcide Clever M.D.   On: 06/12/2023 20:11   DG Chest 1 View Result Date: 06/11/2023 CLINICAL DATA:  Shortness of breath, recent diagnosis of pneumonia. EXAM: CHEST  1 VIEW  COMPARISON:  06/10/2023, 06/12/2023. FINDINGS: Single lateral view of the chest was obtained. Patchy airspace disease is present in the mid to lower lung fields. There suspected consolidation in the right upper lobe. No acute osseous abnormality is seen. IMPRESSION: Patchy airspace disease in the mid to lower lung fields, compatible with known multifocal pneumonia. There is suspected consolidation in the right upper lobe. Repeat AP view of the chest is recommended. Electronically Signed   By: Thornell Sartorius M.D.   On: 06/11/2023 01:38   DG Chest Portable 1 View Result Date: 06/11/2023 CLINICAL DATA:  Recent diagnosis with pneumonia, reports he cannot take a deep breath without pain. EXAM: PORTABLE CHEST 1 VIEW COMPARISON:  June 04, 2023 FINDINGS: The cardiac silhouette is mildly enlarged and unchanged in size. Low lung volumes are seen with mild to moderate severity bibasilar infiltrates, left greater than right. This is mildly decreased in severity when compared to the prior study. No pleural effusion or pneumothorax is identified. The visualized skeletal structures are unremarkable. IMPRESSION: Low lung volumes with mild to moderate severity bibasilar infiltrates, left greater than right. Electronically Signed   By: Aram Candela M.D.   On: 06/11/2023 00:12   (Echo, Carotid, EGD, Colonoscopy, ERCP)    Subjective: Patient seen and examined.  Has some dry cough.  Denies any other complaints.  He understands the diagnosis of pneumonia.  Patient overall has very poor understanding of his disease process.  It is hard for him to understand about his ejection fraction of 20%.  We discussed about not doing cocaine no more and he agrees.  Patient does have previously prescribed Augmentin with him.   Discharge Exam: Vitals:   07/04/23 0411 07/04/23 0805  BP: (!) 130/90 (!) 130/91  Pulse: 88 85  Resp: 19   Temp: 97.9 F (36.6 C) 97.9 F (36.6 C)  SpO2: 100% 99%   Vitals:   07/04/23 0058  07/04/23 0411 07/04/23 0418 07/04/23 0805  BP: (!) 124/93 (!) 130/90  (!) 130/91  Pulse: 91 88  85  Resp: 19 19    Temp: 98.1 F (36.7 C) 97.9 F (36.6 C)  97.9 F (36.6 C)  TempSrc:    Oral  SpO2: 99% 100%  99%  Weight:   69.4 kg   Height:        Looks comfortable on room air.    The results of significant diagnostics from this hospitalization (including imaging, microbiology, ancillary and laboratory) are listed below for reference.     Microbiology: Recent Results (from the past 240 hours)  Blood culture (routine x 2)     Status: None   Collection Time: 06/26/23 11:03 PM   Specimen: BLOOD  Result Value Ref Range Status   Specimen Description   Final    BLOOD RIGHT ANTECUBITAL Performed at [facility], [address], [place], [place] [address]    Special Requests   Final    BOTTLES DRAWN AEROBIC AND ANAEROBIC Blood Culture adequate volume Performed at [facility], [address] [place], [place] [address]    Culture   Final    NO GROWTH 5 DAYS Performed at [facility], [address], [place], [place] [address]    Report Status 07/02/2023 FINAL  Final  Blood culture (routine x 2)     Status: None   Collection Time: 06/27/23 11:09 AM   Specimen: BLOOD LEFT ARM  Result Value Ref Range Status   Specimen Description   Final  BLOOD LEFT ARM Performed at [facility], [address] [place], [place] [address]    Special Requests   Final    BOTTLES DRAWN AEROBIC ONLY Blood Culture results may not be optimal due to an inadequate volume of blood received in culture bottles Performed at [facility], [address] [place], [place] [address]    Culture   Final    NO GROWTH 5 DAYS Performed at [facility], [address] [place], [place] [address]    Report Status 07/02/2023 FINAL  Final  Aerobic/Anaerobic Culture w Gram Stain (surgical/deep wound)     Status: None (Preliminary  result)   Collection Time: 07/03/23  2:29 PM   Specimen: PATH Cytology Ebus; Tissue  Result Value Ref Range Status   Specimen Description BRONCHIAL ALVEOLAR LAVAGE  Final   Special Requests FNA  Final   Gram Stain   Final    FEW WBC PRESENT,BOTH PMN AND MONONUCLEAR NO ORGANISMS SEEN    Culture   Final    NO GROWTH < 24 HOURS Performed at [facility], [address] [place], [place] [address]    Report Status PENDING  Incomplete  Culture, BAL-quantitative w Gram Stain     Status: None (Preliminary result)   Collection Time: 07/03/23  2:43 PM   Specimen: Bronchial Alveolar Lavage; Respiratory  Result Value Ref Range Status   Specimen Description BRONCHIAL ALVEOLAR LAVAGE  Final   Special Requests RLL  Final   Gram Stain   Final    NO WBC SEEN NO ORGANISMS SEEN Performed at [facility], [address], [place], [place] [address]    Culture PENDING  Incomplete   Report Status PENDING  Incomplete  Aerobic/Anaerobic Culture w Gram Stain (surgical/deep wound)     Status: None (Preliminary result)   Collection Time: 07/03/23  2:43 PM   Specimen: Bronchial Alveolar Lavage; Respiratory  Result Value Ref Range Status   Specimen Description BRONCHIAL ALVEOLAR LAVAGE  Final   Special Requests RLL  Final   Gram Stain NO WBC SEEN NO ORGANISMS SEEN   Final   Culture   Final    NO GROWTH < 24 HOURS Performed at [facility], [address], [place], [place] [address]    Report Status PENDING  Incomplete  Culture, BAL-quantitative w Gram Stain     Status: None (Preliminary result)   Collection Time: 07/03/23  2:43 PM   Specimen: Bronchial Alveolar Lavage; Respiratory  Result Value Ref Range Status   Specimen Description BRONCHIAL ALVEOLAR LAVAGE  Final   Special Requests LLL  Final   Gram Stain   Final    FEW WBC PRESENT,BOTH PMN AND MONONUCLEAR NO ORGANISMS SEEN    Culture   Final    NO GROWTH < 24 HOURS Performed at [facility], [address],  [place], [place] [address]    Report Status PENDING  Incomplete  Aerobic/Anaerobic Culture w Gram Stain (surgical/deep wound)     Status: None (Preliminary result)   Collection Time: 07/03/23  2:43 PM   Specimen: Bronchial Alveolar Lavage; Respiratory  Result Value Ref Range Status   Specimen Description BRONCHIAL ALVEOLAR LAVAGE  Final   Special Requests LLL  Final   Gram Stain   Final    FEW WBC PRESENT,BOTH PMN AND MONONUCLEAR NO ORGANISMS SEEN    Culture   Final    NO GROWTH < 24 HOURS Performed at [facility], [address] [place], [place] [address]    Report Status PENDING  Incomplete     Labs: BNP (last 3 results) Recent Labs    06/16/23 2334 06/19/23 1700 06/26/23 2053  BNP 1,032.8* 1,415.7* 711.9*   Basic Metabolic Panel: No results for input(s): "NA", "K", "CL", "CO2", "GLUCOSE", "BUN", "CREATININE", "CALCIUM", "MG", "PHOS" in the last 168 hours.  Liver Function Tests: No results for input(s): "AST", "ALT", "ALKPHOS", "BILITOT", "PROT", "ALBUMIN" in the last 168 hours. No results for input(s): "LIPASE", "AMYLASE" in the last 168 hours. No results for input(s): "AMMONIA" in the last 168 hours. CBC: Recent Labs  Lab 06/27/23 1735  WBC 9.0  HGB 11.6*  HCT 35.8*  MCV 91.6  PLT 354   Cardiac Enzymes: Recent Labs  Lab 06/27/23 1734  CKTOTAL 30*   BNP: Invalid input(s): "POCBNP" CBG: No results for input(s): "GLUCAP" in the last 168 hours. D-Dimer No results for input(s): "DDIMER" in the last 72 hours. Hgb A1c No results for input(s): "HGBA1C" in the last 72 hours. Lipid Profile No results for input(s): "CHOL", "HDL", "LDLCALC", "TRIG", "CHOLHDL", "LDLDIRECT" in the last 72 hours. Thyroid function studies No results for input(s): "TSH", "T4TOTAL", "T3FREE", "THYROIDAB" in the last 72 hours.  Invalid input(s): "FREET3" Anemia work up No results for input(s): "VITAMINB12", "FOLATE", "FERRITIN", "TIBC", "IRON", "RETICCTPCT" in the last 72  hours. Urinalysis    Component Value Date/Time   COLORURINE YELLOW 06/17/2023 0308   APPEARANCEUR CLEAR 06/17/2023 0308   LABSPEC 1.021 06/17/2023 0308   PHURINE 6.0 06/17/2023 0308   GLUCOSEU NEGATIVE 06/17/2023 0308   HGBUR NEGATIVE 06/17/2023 0308   BILIRUBINUR NEGATIVE 06/17/2023 0308   KETONESUR NEGATIVE 06/17/2023 0308   PROTEINUR 100 (A) 06/17/2023 0308   UROBILINOGEN 0.2 05/14/2015 0845   NITRITE NEGATIVE 06/17/2023 0308   LEUKOCYTESUR NEGATIVE 06/17/2023 0308   Sepsis Labs Recent Labs  Lab 06/27/23 1735  WBC 9.0   Microbiology Recent Results (from the past 240 hours)  Blood culture (routine x 2)     Status: None   Collection Time: 06/26/23 11:03 PM   Specimen: BLOOD  Result Value Ref Range Status   Specimen Description   Final    BLOOD RIGHT ANTECUBITAL Performed at [facility], [address], [place], [place] [address]    Special Requests   Final    BOTTLES DRAWN AEROBIC AND ANAEROBIC Blood Culture adequate volume Performed at [facility], [address] [place], [place] [address]    Culture   Final    NO GROWTH 5 DAYS Performed at [facility], [address], [place], [place] [address]    Report Status 07/02/2023 FINAL  Final  Blood culture (routine x 2)     Status: None   Collection Time: 06/27/23 11:09 AM   Specimen: BLOOD LEFT ARM  Result Value Ref Range Status   Specimen Description   Final    BLOOD LEFT ARM Performed at [facility], [address], [place], [place] [address]    Special Requests   Final    BOTTLES DRAWN AEROBIC ONLY Blood Culture results may not be optimal due to an inadequate volume of blood received in culture bottles Performed at [facility], [address], [place], [place] [address]    Culture   Final    NO GROWTH 5 DAYS Performed at [facility], [address], [place], [place] [address]    Report Status 07/02/2023  FINAL  Final   Aerobic/Anaerobic Culture w Gram Stain (surgical/deep wound)     Status: None (Preliminary result)   Collection Time: 07/03/23  2:29 PM   Specimen: PATH Cytology Ebus; Tissue  Result Value Ref Range Status   Specimen Description BRONCHIAL ALVEOLAR LAVAGE  Final   Special Requests FNA  Final   Gram Stain   Final    FEW WBC PRESENT,BOTH PMN AND MONONUCLEAR NO ORGANISMS SEEN    Culture   Final    NO GROWTH < 24 HOURS Performed at [facility], [address], [place], [place] [address]    Report Status PENDING  Incomplete  Culture, BAL-quantitative w Gram Stain     Status: None (Preliminary result)   Collection Time: 07/03/23  2:43 PM   Specimen: Bronchial Alveolar Lavage; Respiratory  Result Value Ref Range Status   Specimen Description BRONCHIAL ALVEOLAR LAVAGE  Final   Special Requests RLL  Final   Gram Stain   Final    NO WBC SEEN NO ORGANISMS SEEN Performed at [facility], [address], [place], [place] [address]    Culture PENDING  Incomplete   Report Status PENDING  Incomplete  Aerobic/Anaerobic Culture w Gram Stain (surgical/deep wound)     Status: None (Preliminary result)   Collection Time: 07/03/23  2:43 PM   Specimen: Bronchial Alveolar Lavage; Respiratory  Result Value Ref Range Status   Specimen Description BRONCHIAL ALVEOLAR LAVAGE  Final   Special Requests RLL  Final   Gram Stain NO WBC SEEN NO ORGANISMS SEEN   Final   Culture   Final    NO GROWTH < 24 HOURS Performed at [facility], [address], [place], [place] [address]    Report Status PENDING  Incomplete  Culture, BAL-quantitative w Gram Stain     Status: None (Preliminary result)   Collection Time: 07/03/23  2:43 PM   Specimen: Bronchial Alveolar Lavage; Respiratory  Result Value Ref Range Status   Specimen Description BRONCHIAL ALVEOLAR LAVAGE  Final   Special Requests LLL  Final   Gram Stain   Final    FEW WBC PRESENT,BOTH PMN AND MONONUCLEAR NO ORGANISMS SEEN     Culture   Final    NO GROWTH < 24 HOURS Performed at [facility], [address] [place], [place] [address]    Report Status PENDING  Incomplete  Aerobic/Anaerobic Culture w Gram Stain (surgical/deep wound)     Status: None (Preliminary result)   Collection Time: 07/03/23  2:43 PM   Specimen: Bronchial Alveolar Lavage; Respiratory  Result Value Ref Range Status   Specimen Description BRONCHIAL ALVEOLAR LAVAGE  Final   Special Requests LLL  Final   Gram Stain   Final    FEW WBC PRESENT,BOTH PMN AND MONONUCLEAR NO ORGANISMS SEEN    Culture   Final    NO GROWTH < 24 HOURS Performed at [facility], [address], [place], [place] [address]    Report Status PENDING  Incomplete     Time coordinating discharge: 35 minutes  SIGNED:   Dorcas Carrow, MD  [facility] 07/04/2023, 1:21 PM

## 2023-07-05 LAB — ACID FAST SMEAR (AFB, MYCOBACTERIA)
Acid Fast Smear: NEGATIVE
Acid Fast Smear: NEGATIVE
Acid Fast Smear: NEGATIVE

## 2023-07-07 LAB — CULTURE, BAL-QUANTITATIVE W GRAM STAIN
Culture: NO GROWTH
Culture: NO GROWTH
Gram Stain: NONE SEEN

## 2023-07-08 ENCOUNTER — Telehealth: Payer: Self-pay | Admitting: Family

## 2023-07-08 LAB — AEROBIC/ANAEROBIC CULTURE W GRAM STAIN (SURGICAL/DEEP WOUND)
Culture: NO GROWTH
Culture: NO GROWTH
Culture: NO GROWTH
Gram Stain: NONE SEEN

## 2023-07-08 NOTE — Telephone Encounter (Signed)
Called pt to schedule appt per CMA Curley Spice for [facility] paperwork to get filled. Left vm to call office back to schedule

## 2023-07-09 ENCOUNTER — Other Ambulatory Visit: Payer: Self-pay

## 2023-07-09 NOTE — Patient Instructions (Signed)
Visit Information  Mr. Calahan was given information about Medicaid Managed Care team care coordination services as a part of their Healthy [facility] Medicaid benefit. Rynell Carner Houser verbally consented to engagement with the [facility] Managed Care team.   If you are experiencing a medical emergency, please call 911 or report to your local emergency department or urgent care.   If you have a non-emergency medical problem during routine business hours, please contact your provider's office and ask to speak with a nurse.   For questions related to your Healthy [facility] health plan, please call: [phone] or visit the homepage here: [email or web address]  If you would like to schedule transportation through your Healthy [facility] plan, please call the following number at least 2 days in advance of your appointment: [phone]  For information about your ride after you set it up, call Ride Assist at [phone]. Use this number to activate a Will Call pickup, or if your transportation is late for a scheduled pickup. Use this number, too, if you need to make a change or cancel a previously scheduled reservation.  If you need transportation services right away, call [phone]. The after-hours call center is staffed 24 hours to handle ride assistance and urgent reservation requests (including discharges) 365 days a year. Urgent trips include sick visits, hospital discharge requests and life-sustaining treatment.  Call the [facility] Line at [phone], at any time, 24 hours a day, 7 days a week. If you are in danger or need immediate medical attention call 911.  If you would like help to quit smoking, call 1-800-QUIT-NOW (971 193 1122) OR Espaol: 1-855-Djelo-Ya (4-742-595-6387) o para ms informacin haga clic aqu or Text READY to 564-332 to register via text  Mr. Tull - following are the goals we discussed in your visit today:    Goals Addressed   None      Social Worker will follow up in 30 days.   Gus Puma, Kenard Gower, MHA [facility]  Managed Medicaid Social Worker [phone]   Following is a copy of your plan of care:  There are no care plans that you recently modified to display for this patient.

## 2023-07-09 NOTE — Patient Outreach (Signed)
Medicaid Managed Care Social Work Note  07/09/2023 Name:  Craig Schmidt MRN:  [id number] DOB:  1970-09-11  Craig Schmidt is an 52 y.o. year old male who is a primary patient of Rema Fendt, NP.  The [facility] Managed Care Coordination team was consulted for assistance with:   housing  Mr. Snipe was given information about Medicaid Managed Care Coordination team services today. Lynelle Doctor Wolpert Patient agreed to services and verbal consent obtained.  Engaged with patient  for by telephone forinitial visit in response to referral for case management and/or care coordination services.   Patient is participating in a Managed Medicaid Plan:  Yes  Assessments/Interventions:  Review of past medical history, allergies, medications, health status, including review of consultants reports, laboratory and other test data, was performed as part of comprehensive evaluation and provision of chronic care management services.  SDOH: (Social Drivers of Health) assessments and interventions performed: SDOH Interventions    Flowsheet Row ED to Hosp-Admission (Discharged) from 06/26/2023 in [facility] ED to Hosp-Admission (Discharged) from 06/19/2023 in [facility] Telephone from 03/07/2023 in [facility] from 01/23/2023 in [facility] Telephone from 01/17/2023 in [facility] ED to Hosp-Admission (Discharged) from 11/28/2022 in [facility]  SDOH Interventions        Food Insecurity Interventions -- Inpatient TOC, Other (Comment)  [Resources added to AVS] -- -- -- --  Housing Interventions -- Inpatient TOC, Other (Comment)  [Resource added to AVS] -- -- -- --  Soil scientist Given Inpatient TOC, Payor Benefit, Other (Comment)  [Resource added to  AVS] [employer] Given [employer] Given [employer] Given Inpatient TOC, Taxi Voucher Given  Utilities Interventions -- Inpatient TOC, Intervention Not Indicated  [Houseless. Has no utilities] -- -- -- --     BSW completed a telephone outreach with patient, he states he needs assistance with a security deposit. Patient states he has found a place to move to and needs the deposit by Friday. Patient states his phone is messed up and unable to see emails. BSW and patient agreed for resources to be mailed and will inform the new place that he will have his deposit later in the month. Patient states no other resources are needed at this time.  Advanced Directives Status:  Not addressed in this encounter.  Care Plan                 Allergies  Allergen Reactions   Shellfish Allergy Anaphylaxis   Peanut (Diagnostic) Itching    Medications Reviewed Today   Medications were not reviewed in this encounter     Patient Active Problem List   Diagnosis Date Noted   Coronary artery disease 06/27/2023   History of CVA (cerebrovascular accident) 06/27/2023   Substance use disorder 06/27/2023   Atypical chest pain 06/21/2023   Pulmonary infiltrate 06/21/2023   Multifocal pneumonia 06/11/2023   Hyperkalemia 06/11/2023   Hyponatremia 06/11/2023   Normocytic anemia 06/11/2023   Thrombocytosis 06/11/2023   Troponin I above reference range 05/25/2023   CAP (community acquired pneumonia) 05/25/2023   Pneumonia 05/25/2023   Chronic combined systolic and diastolic CHF (congestive heart failure) (HCC) 12/03/2022   Malnutrition of moderate degree 11/30/2022   Acute on chronic combined systolic and diastolic CHF (congestive heart failure) (HCC) 11/29/2022  Acute on chronic combined systolic (congestive) and diastolic (congestive) heart failure (HCC) 11/28/2022   ACS (acute coronary syndrome) (HCC) 11/28/2022   Back pain 11/28/2022   Essential hypertension 11/28/2022   Hyperlipidemia 11/28/2022    Noncompliance with medication regimen 11/28/2022   ST elevation myocardial infarction (STEMI) of inferolateral wall, subsequent episode of care (HCC) 12/19/2021   ST elevation myocardial infarction (STEMI) (HCC)    Ischemic cardiomyopathy    Cocaine abuse (HCC)    Smoker    Alcohol abuse    CVA (cerebral vascular accident) (HCC) 09/08/2021   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   Tear of MCL (medial collateral ligament) of knee 05/20/2015   Protein-calorie malnutrition, severe 05/17/2015   Lactic acidosis 05/15/2015   Left knee pain 05/15/2015   Lung nodules 05/15/2015   Hypoglycemia 05/14/2015   Hypothermia 05/14/2015   Acute encephalopathy 05/14/2015   Cocaine abuse with intoxication (HCC) 05/14/2015   Alcohol intoxication in active alcoholic (HCC) 05/14/2015   Sepsis (HCC) 05/14/2015   Homeless 05/14/2015    Conditions to be addressed/monitored per PCP order:   housing  There are no care plans that you recently modified to display for this patient.   Follow up:  Patient agrees to Care Plan and Follow-up.  Plan: The Managed Medicaid care management team will reach out to the patient again over the next 30 days.  Date/time of next scheduled Social Work care management/care coordination outreach:  08/09/23  Gus Puma, Kenard Gower, [facility]  Managed [facility] Social Worker [phone]

## 2023-07-10 ENCOUNTER — Emergency Department (HOSPITAL_COMMUNITY)
Admission: EM | Admit: 2023-07-10 | Discharge: 2023-07-10 | Disposition: A | Discharge status: Home / Self Care | Payer: Medicaid Other | Attending: Emergency Medicine | Admitting: Emergency Medicine

## 2023-07-10 ENCOUNTER — Other Ambulatory Visit (HOSPITAL_COMMUNITY): Payer: Self-pay

## 2023-07-10 ENCOUNTER — Other Ambulatory Visit: Payer: Self-pay

## 2023-07-10 ENCOUNTER — Encounter (HOSPITAL_COMMUNITY): Payer: Self-pay

## 2023-07-10 ENCOUNTER — Emergency Department (HOSPITAL_COMMUNITY): Payer: Medicaid Other

## 2023-07-10 ENCOUNTER — Telehealth: Payer: Self-pay

## 2023-07-10 ENCOUNTER — Inpatient Hospital Stay (HOSPITAL_COMMUNITY)
Admission: EM | Admit: 2023-07-10 | Discharge: 2023-07-15 | DRG: 291 | Disposition: A | Discharge status: Home / Self Care | Payer: Medicaid Other | Attending: Internal Medicine | Admitting: Internal Medicine

## 2023-07-10 DIAGNOSIS — J449 Chronic obstructive pulmonary disease, unspecified: Secondary | ICD-10-CM | POA: Diagnosis present

## 2023-07-10 DIAGNOSIS — I255 Ischemic cardiomyopathy: Secondary | ICD-10-CM | POA: Diagnosis present

## 2023-07-10 DIAGNOSIS — F1420 Cocaine dependence, uncomplicated: Secondary | ICD-10-CM | POA: Diagnosis present

## 2023-07-10 DIAGNOSIS — F17210 Nicotine dependence, cigarettes, uncomplicated: Secondary | ICD-10-CM | POA: Diagnosis present

## 2023-07-10 DIAGNOSIS — I110 Hypertensive heart disease with heart failure: Secondary | ICD-10-CM | POA: Insufficient documentation

## 2023-07-10 DIAGNOSIS — F1010 Alcohol abuse, uncomplicated: Secondary | ICD-10-CM | POA: Diagnosis present

## 2023-07-10 DIAGNOSIS — I5043 Acute on chronic combined systolic (congestive) and diastolic (congestive) heart failure: Secondary | ICD-10-CM | POA: Diagnosis present

## 2023-07-10 DIAGNOSIS — Z8673 Personal history of transient ischemic attack (TIA), and cerebral infarction without residual deficits: Secondary | ICD-10-CM | POA: Diagnosis not present

## 2023-07-10 DIAGNOSIS — Z1152 Encounter for screening for COVID-19: Secondary | ICD-10-CM

## 2023-07-10 DIAGNOSIS — Z555 Less than a high school diploma: Secondary | ICD-10-CM

## 2023-07-10 DIAGNOSIS — N179 Acute kidney failure, unspecified: Secondary | ICD-10-CM | POA: Diagnosis present

## 2023-07-10 DIAGNOSIS — R0789 Other chest pain: Secondary | ICD-10-CM

## 2023-07-10 DIAGNOSIS — Z7901 Long term (current) use of anticoagulants: Secondary | ICD-10-CM | POA: Insufficient documentation

## 2023-07-10 DIAGNOSIS — Z7984 Long term (current) use of oral hypoglycemic drugs: Secondary | ICD-10-CM

## 2023-07-10 DIAGNOSIS — Z7902 Long term (current) use of antithrombotics/antiplatelets: Secondary | ICD-10-CM | POA: Diagnosis not present

## 2023-07-10 DIAGNOSIS — Z5986 Financial insecurity: Secondary | ICD-10-CM

## 2023-07-10 DIAGNOSIS — J189 Pneumonia, unspecified organism: Principal | ICD-10-CM | POA: Diagnosis present

## 2023-07-10 DIAGNOSIS — Z91010 Allergy to peanuts: Secondary | ICD-10-CM | POA: Diagnosis not present

## 2023-07-10 DIAGNOSIS — I130 Hypertensive heart and chronic kidney disease with heart failure and stage 1 through stage 4 chronic kidney disease, or unspecified chronic kidney disease: Principal | ICD-10-CM | POA: Diagnosis present

## 2023-07-10 DIAGNOSIS — J188 Other pneumonia, unspecified organism: Secondary | ICD-10-CM | POA: Diagnosis present

## 2023-07-10 DIAGNOSIS — Z79899 Other long term (current) drug therapy: Secondary | ICD-10-CM | POA: Diagnosis not present

## 2023-07-10 DIAGNOSIS — I509 Heart failure, unspecified: Secondary | ICD-10-CM | POA: Insufficient documentation

## 2023-07-10 DIAGNOSIS — I5020 Unspecified systolic (congestive) heart failure: Secondary | ICD-10-CM

## 2023-07-10 DIAGNOSIS — I252 Old myocardial infarction: Secondary | ICD-10-CM

## 2023-07-10 DIAGNOSIS — I2489 Other forms of acute ischemic heart disease: Secondary | ICD-10-CM | POA: Diagnosis present

## 2023-07-10 DIAGNOSIS — Z560 Unemployment, unspecified: Secondary | ICD-10-CM

## 2023-07-10 DIAGNOSIS — E876 Hypokalemia: Secondary | ICD-10-CM | POA: Diagnosis not present

## 2023-07-10 DIAGNOSIS — N1831 Chronic kidney disease, stage 3a: Secondary | ICD-10-CM | POA: Diagnosis present

## 2023-07-10 DIAGNOSIS — J850 Gangrene and necrosis of lung: Secondary | ICD-10-CM | POA: Diagnosis present

## 2023-07-10 DIAGNOSIS — I5082 Biventricular heart failure: Secondary | ICD-10-CM | POA: Diagnosis present

## 2023-07-10 DIAGNOSIS — I5A Non-ischemic myocardial injury (non-traumatic): Secondary | ICD-10-CM | POA: Diagnosis present

## 2023-07-10 DIAGNOSIS — Z8701 Personal history of pneumonia (recurrent): Secondary | ICD-10-CM

## 2023-07-10 DIAGNOSIS — J45909 Unspecified asthma, uncomplicated: Secondary | ICD-10-CM | POA: Diagnosis present

## 2023-07-10 DIAGNOSIS — Z87892 Personal history of anaphylaxis: Secondary | ICD-10-CM

## 2023-07-10 DIAGNOSIS — M19011 Primary osteoarthritis, right shoulder: Secondary | ICD-10-CM | POA: Diagnosis present

## 2023-07-10 DIAGNOSIS — I2510 Atherosclerotic heart disease of native coronary artery without angina pectoris: Secondary | ICD-10-CM | POA: Diagnosis present

## 2023-07-10 DIAGNOSIS — Z833 Family history of diabetes mellitus: Secondary | ICD-10-CM

## 2023-07-10 DIAGNOSIS — Z5982 Transportation insecurity: Secondary | ICD-10-CM

## 2023-07-10 DIAGNOSIS — F1410 Cocaine abuse, uncomplicated: Secondary | ICD-10-CM | POA: Diagnosis present

## 2023-07-10 DIAGNOSIS — Z5941 Food insecurity: Secondary | ICD-10-CM

## 2023-07-10 DIAGNOSIS — I050 Rheumatic mitral stenosis: Secondary | ICD-10-CM | POA: Diagnosis present

## 2023-07-10 DIAGNOSIS — I69331 Monoplegia of upper limb following cerebral infarction affecting right dominant side: Secondary | ICD-10-CM

## 2023-07-10 DIAGNOSIS — Z5900 Homelessness unspecified: Secondary | ICD-10-CM

## 2023-07-10 DIAGNOSIS — Z91013 Allergy to seafood: Secondary | ICD-10-CM

## 2023-07-10 LAB — TROPONIN I (HIGH SENSITIVITY)
Troponin I (High Sensitivity): 28 ng/L — ABNORMAL HIGH (ref <18)
Troponin I (High Sensitivity): 29 ng/L — ABNORMAL HIGH (ref <18)

## 2023-07-10 LAB — COMPREHENSIVE METABOLIC PANEL
ALT: 11 U/L (ref 0–44)
AST: 33 U/L (ref 15–41)
Albumin: 3.1 g/dL — ABNORMAL LOW (ref 3.5–5.0)
Alkaline Phosphatase: 114 U/L (ref 38–126)
Anion gap: 10 (ref 5–15)
BUN: 21 mg/dL — ABNORMAL HIGH (ref 6–20)
CO2: 18 mmol/L — ABNORMAL LOW (ref 22–32)
Calcium: 8.8 mg/dL — ABNORMAL LOW (ref 8.9–10.3)
Chloride: 105 mmol/L (ref 98–111)
Creatinine, Ser: 1.1 mg/dL (ref 0.61–1.24)
GFR, Estimated: >60 mL/min (ref >60)
Glucose, Bld: 80 mg/dL (ref 70–99)
Potassium: 4 mmol/L (ref 3.5–5.1)
Sodium: 133 mmol/L — ABNORMAL LOW (ref 135–145)
Total Bilirubin: 0.7 mg/dL (ref <1.2)
Total Protein: 7.2 g/dL (ref 6.5–8.1)

## 2023-07-10 LAB — BASIC METABOLIC PANEL
Anion gap: 9 (ref 5–15)
BUN: 27 mg/dL — ABNORMAL HIGH (ref 6–20)
CO2: 23 mmol/L (ref 22–32)
Calcium: 9 mg/dL (ref 8.9–10.3)
Chloride: 105 mmol/L (ref 98–111)
Creatinine, Ser: 1.54 mg/dL — ABNORMAL HIGH (ref 0.61–1.24)
GFR, Estimated: 55 mL/min — ABNORMAL LOW (ref >60)
Glucose, Bld: 115 mg/dL — ABNORMAL HIGH (ref 70–99)
Potassium: 4.4 mmol/L (ref 3.5–5.1)
Sodium: 137 mmol/L (ref 135–145)

## 2023-07-10 LAB — CBC WITH DIFFERENTIAL/PLATELET
Abs Immature Granulocytes: 0.03 10*3/uL (ref 0.00–0.07)
Basophils Absolute: 0.1 10*3/uL (ref 0.0–0.1)
Basophils Relative: 0 %
Eosinophils Absolute: 0.8 10*3/uL — ABNORMAL HIGH (ref 0.0–0.5)
Eosinophils Relative: 7 %
HCT: 31 % — ABNORMAL LOW (ref 39.0–52.0)
Hemoglobin: 9.8 g/dL — ABNORMAL LOW (ref 13.0–17.0)
Immature Granulocytes: 0 %
Lymphocytes Relative: 20 %
Lymphs Abs: 2.3 10*3/uL (ref 0.7–4.0)
MCH: 28.9 pg (ref 26.0–34.0)
MCHC: 31.6 g/dL (ref 30.0–36.0)
MCV: 91.4 fL (ref 80.0–100.0)
Monocytes Absolute: 0.3 10*3/uL (ref 0.1–1.0)
Monocytes Relative: 3 %
Neutro Abs: 7.8 10*3/uL — ABNORMAL HIGH (ref 1.7–7.7)
Neutrophils Relative %: 70 %
Platelets: 311 10*3/uL (ref 150–400)
RBC: 3.39 MIL/uL — ABNORMAL LOW (ref 4.22–5.81)
RDW: 16.5 % — ABNORMAL HIGH (ref 11.5–15.5)
WBC: 11.2 10*3/uL — ABNORMAL HIGH (ref 4.0–10.5)
nRBC: 0 % (ref 0.0–0.2)

## 2023-07-10 LAB — CBC
HCT: 31.4 % — ABNORMAL LOW (ref 39.0–52.0)
Hemoglobin: 10.3 g/dL — ABNORMAL LOW (ref 13.0–17.0)
MCH: 29.3 pg (ref 26.0–34.0)
MCHC: 32.8 g/dL (ref 30.0–36.0)
MCV: 89.2 fL (ref 80.0–100.0)
Platelets: 344 10*3/uL (ref 150–400)
RBC: 3.52 MIL/uL — ABNORMAL LOW (ref 4.22–5.81)
RDW: 16.8 % — ABNORMAL HIGH (ref 11.5–15.5)
WBC: 10 10*3/uL (ref 4.0–10.5)
nRBC: 0 % (ref 0.0–0.2)

## 2023-07-10 LAB — BRAIN NATRIURETIC PEPTIDE: B Natriuretic Peptide: 2198.2 pg/mL — ABNORMAL HIGH (ref 0.0–100.0)

## 2023-07-10 LAB — CBG MONITORING, ED: Glucose-Capillary: 99 mg/dL (ref 70–99)

## 2023-07-10 MED ORDER — OXYCODONE-ACETAMINOPHEN 5-325 MG PO TABS
1.0000 | ORAL_TABLET | Freq: Four times a day (QID) | ORAL | 0 refills | Status: AC | PRN
  Filled 2023-07-10 (×2): qty 15, 4d supply, fill #0

## 2023-07-10 MED ORDER — FUROSEMIDE 10 MG/ML IJ SOLN
40.0000 mg | Freq: Once | INTRAMUSCULAR | Status: AC
  Administered 2023-07-10: 40 mg via INTRAVENOUS
  Filled 2023-07-10: qty 4

## 2023-07-10 NOTE — Transitions of Care (Post Inpatient/ED Visit) (Signed)
   07/10/2023  Name: Craig Schmidt MRN: [id number] DOB: Jul 13, 1971  Today's TOC FU Call Status: Unsuccessful Call (1st Attempt) Date: 07/10/23  Attempted to reach the patient regarding the most recent Inpatient/ED visit.  Follow Up Plan: Additional outreach attempts will be made to reach the patient to complete the Transitions of Care (Post Inpatient/ED visit) call.   Signature Robyne Peers, RN

## 2023-07-10 NOTE — ED Triage Notes (Signed)
Pt came in via EMS from auto zone parking lot. C/O of left sided flank pain exacerbated by coughing. Was seen earlier in hospital and diagnose with pneumonia. Pt states they have been compliant with antibiotics. Hx of stroke and heart attack w/ Right side deficits.  EMS Pulse 88 O2 100 Temp 97

## 2023-07-10 NOTE — ED Triage Notes (Signed)
Pt biba picked up from ambulance [place] bus stop. Was d/c from hospital this morning for flank pain. Patient reports weakness  Old R side deficits from a stroke. Patient thinks he has pneumonia despite being r/o on admission earlier.

## 2023-07-10 NOTE — ED Provider Triage Note (Signed)
Emergency Medicine Provider Triage Evaluation Note  Craig Schmidt , a 52 y.o. male  was evaluated in triage.  Pt complains of chest pain.  He presents for evaluation of left-sided chest pain that started 1 hour prior to ED arrival.  He does have some shortness of breath.  No fevers, abdominal pain, nausea and vomiting.  Recently discharged on antibiotics.  He is compliant with his medications..  Review of Systems  Positive: Chest pain Negative: Fever, abdominal pain  Physical Exam  BP (!) 154/122 (BP Location: Left Arm)   Pulse 96   Temp (!) 97.5 F (36.4 C) (Oral)   Resp (!) 23   SpO2 100%  Gen:   Awake, no distress   Resp:  Normal effort   Medical Decision Making  Medically screening exam initiated at 6:17 AM.  Appropriate orders placed.  Craig Schmidt was informed that the remainder of the evaluation will be completed by another provider, this initial triage assessment does not replace that evaluation, and the importance of remaining in the ED until their evaluation is complete.     Tilden Fossa, MD 07/10/23 [phone]

## 2023-07-10 NOTE — ED Provider Notes (Signed)
[place] EMERGENCY DEPARTMENT AT [facility] Provider Note   CSN: [id number] Arrival date & time: 07/10/23  0534     History  Chief Complaint  Patient presents with   Flank Pain    Craig Schmidt is a 52 y.o. male.  Patient has history of hypertension and and congestive heart failure.  Patient complains of left-sided chest pain  The history is provided by the patient and medical records. No language interpreter was used.  Flank Pain The current episode started 6 to 12 hours ago. The problem occurs constantly. The problem has not changed since onset.Associated symptoms include chest pain. Pertinent negatives include no abdominal pain and no headaches. Nothing aggravates the symptoms. Nothing relieves the symptoms. He has tried nothing for the symptoms.       Home Medications Prior to Admission medications   Medication Sig Start Date End Date Taking? Authorizing Provider  oxyCODONE-acetaminophen (PERCOCET) 5-325 MG tablet Take 1 tablet by mouth every 6 (six) hours as needed. 07/10/23  Yes Bethann Berkshire, MD  amoxicillin-clavulanate (AUGMENTIN) 875-125 MG tablet Take 1 tablet by mouth every 12 (twelve) hours for 28 days. 06/22/23 07/20/23  DanfordEarl Lites, MD  atorvastatin (LIPITOR) 40 MG tablet Take 1 tablet (40 mg total) by mouth daily. 06/10/23   Marinda Elk, MD  carvedilol (COREG) 3.125 MG tablet Take 1 tablet (3.125 mg total) by mouth 2 (two) times daily. 06/10/23   Marinda Elk, MD  clopidogrel (PLAVIX) 75 MG tablet Take 1 tablet (75 mg total) by mouth daily. 06/10/23   Marinda Elk, MD  furosemide (LASIX) 40 MG tablet Take 1 tablet (40 mg total) by mouth daily. Patient taking differently: Take 80 mg by mouth daily. 06/22/23 07/22/23  Danford, Earl Lites, MD  umeclidinium bromide (INCRUSE ELLIPTA) 62.5 MCG/ACT AEPB Inhale 1 puff into the lungs daily. 06/23/23 07/23/23  Danford, Earl Lites, MD  apixaban (ELIQUIS) 5 MG TABS  tablet Take 1 tablet (5 mg total) by mouth 2 (two) times daily. 06/22/23 06/22/23  DanfordEarl Lites, MD      Allergies    Shellfish allergy and Peanut (diagnostic)    Review of Systems   Review of Systems  Constitutional:  Negative for appetite change and fatigue.  HENT:  Negative for congestion, ear discharge and sinus pressure.   Eyes:  Negative for discharge.  Respiratory:  Negative for cough.   Cardiovascular:  Positive for chest pain.  Gastrointestinal:  Negative for abdominal pain and diarrhea.  Genitourinary:  Positive for flank pain. Negative for frequency and hematuria.  Musculoskeletal:  Negative for back pain.  Skin:  Negative for rash.  Neurological:  Negative for seizures and headaches.  Psychiatric/Behavioral:  Negative for hallucinations.     Physical Exam Updated Vital Signs BP (!) 163/116   Pulse 99   Temp (!) 97.5 F (36.4 C) (Oral)   Resp 19   SpO2 100%  Physical Exam Vitals and nursing note reviewed.  Constitutional:      Appearance: He is well-developed.  HENT:     Head: Normocephalic.     Nose: Nose normal.  Eyes:     General: No scleral icterus.    Conjunctiva/sclera: Conjunctivae normal.  Neck:     Thyroid: No thyromegaly.  Cardiovascular:     Rate and Rhythm: Normal rate and regular rhythm.     Heart sounds: No murmur heard.    No friction rub. No gallop.  Pulmonary:     Breath sounds: No stridor.  No wheezing or rales.  Chest:     Chest wall: Tenderness present.  Abdominal:     General: There is no distension.     Tenderness: There is no abdominal tenderness. There is no rebound.  Musculoskeletal:        General: Normal range of motion.     Cervical back: Neck supple.  Lymphadenopathy:     Cervical: No cervical adenopathy.  Skin:    Findings: No erythema or rash.  Neurological:     Mental Status: He is alert and oriented to person, place, and time.     Motor: No abnormal muscle tone.     Coordination: Coordination normal.   Psychiatric:        Behavior: Behavior normal.     ED Results / Procedures / Treatments   Labs (all labs ordered are listed, but only abnormal results are displayed) Labs Reviewed  COMPREHENSIVE METABOLIC PANEL - Abnormal; Notable for the following components:      Result Value   Sodium 133 (*)    CO2 18 (*)    BUN 21 (*)    Calcium 8.8 (*)    Albumin 3.1 (*)    All other components within normal limits  BRAIN NATRIURETIC PEPTIDE - Abnormal; Notable for the following components:   B Natriuretic Peptide 2,198.2 (*)    All other components within normal limits  CBC WITH DIFFERENTIAL/PLATELET - Abnormal; Notable for the following components:   WBC 11.2 (*)    RBC 3.39 (*)    Hemoglobin 9.8 (*)    HCT 31.0 (*)    RDW 16.5 (*)    Neutro Abs 7.8 (*)    Eosinophils Absolute 0.8 (*)    All other components within normal limits  TROPONIN I (HIGH SENSITIVITY) - Abnormal; Notable for the following components:   Troponin I (High Sensitivity) 28 (*)    All other components within normal limits  TROPONIN I (HIGH SENSITIVITY) - Abnormal; Notable for the following components:   Troponin I (High Sensitivity) 29 (*)    All other components within normal limits    EKG EKG Interpretation Date/Time:  Wednesday July 10 2023 05:47:15 EST Ventricular Rate:  99 PR Interval:  146 QRS Duration:  112 QT Interval:  410 QTC Calculation: 527 R Axis:   40  Text Interpretation: Sinus rhythm LAE, consider biatrial enlargement Borderline intraventricular conduction delay Nonspecific T abnormalities, lateral leads Prolonged QT interval Confirmed by Bethann Berkshire [phone]) on 07/10/2023 9:51:50 AM  Radiology DG Chest Port 1 View Result Date: 07/10/2023 CLINICAL DATA:  Chest pain EXAM: PORTABLE CHEST 1 VIEW COMPARISON:  06/26/2023 FINDINGS: Chronic cardiomegaly. Hazy opacity at the right base, airspace opacity adjacent to the pleura by recent chest CT. Improved aeration in the left mid lung. Trace  bilateral pleural fluid. No pneumothorax. IMPRESSION: 1. Stable to improved opacities in the lower lungs seen on chest CT 06/26/2023. Recommend continued follow-up to clearing. 2. Chronic cardiomegaly. Electronically Signed   By: Tiburcio Pea M.D.   On: 07/10/2023 07:47    Procedures Procedures    Medications Ordered in ED Medications  furosemide (LASIX) injection 40 mg (has no administration in time range)    ED Course/ Medical Decision Making/ A&P  Patient with congestive heart failure and chest wall pain.  He improved with pain medicine.  And also was given additional Lasix.  Medical Decision Making Amount and/or Complexity of Data Reviewed Labs: ordered. Radiology: ordered.  Risk Prescription drug management.  This patient presents to the ED for concern of chest pain, this involves an extensive number of treatment options, and is a complaint that carries with it a high risk of complications and morbidity.  The differential diagnosis includes PE, MI   Co morbidities that complicate the patient evaluation  Heart failure   Additional history obtained:  Additional history obtained from patient External records from outside source obtained and reviewed including hospital records   Lab Tests:  I Ordered, and personally interpreted labs.  The pertinent results include: BNP 2198.2   Imaging Studies ordered:  I ordered imaging studies including chest x-ray I independently visualized and interpreted imaging which showed cardiomegaly I agree with the radiologist interpretation   Cardiac Monitoring: / EKG:  The patient was maintained on a cardiac monitor.  I personally viewed and interpreted the cardiac monitored which showed an underlying rhythm of: Normal sinus rhythm   Consultations Obtained:  No consultant Problem List / ED Course / Critical interventions / Medication management  Chest wall pain and congestive heart failure I  ordered medication including Dilaudid for pain and Lasix for heart failure Reevaluation of the patient after these medicines showed that the patient improved I have reviewed the patients home medicines and have made adjustments as needed   Social Determinants of Health:  None   Test / Admission - Considered:  None  Chest wall pain and congestive heart failure.  Patient given a prescription of pain medicine will follow-up with PCP and will continue taking his diuretic        Final Clinical Impression(s) / ED Diagnoses Final diagnoses:  None    Rx / DC Orders ED Discharge Orders          Ordered    oxyCODONE-acetaminophen (PERCOCET) 5-325 MG tablet  Every 6 hours PRN        07/10/23 1055              Bethann Berkshire, MD 07/10/23 1705

## 2023-07-10 NOTE — ED Notes (Addendum)
Pt requesting ultrasound IV for labs.

## 2023-07-10 NOTE — Discharge Instructions (Signed)
Follow-up with your family doctor next week for recheck. 

## 2023-07-11 ENCOUNTER — Emergency Department (HOSPITAL_COMMUNITY): Payer: Medicaid Other

## 2023-07-11 ENCOUNTER — Other Ambulatory Visit (HOSPITAL_COMMUNITY): Payer: Self-pay

## 2023-07-11 ENCOUNTER — Inpatient Hospital Stay (HOSPITAL_COMMUNITY): Payer: Medicaid Other

## 2023-07-11 DIAGNOSIS — N1831 Chronic kidney disease, stage 3a: Secondary | ICD-10-CM | POA: Diagnosis present

## 2023-07-11 DIAGNOSIS — F1410 Cocaine abuse, uncomplicated: Secondary | ICD-10-CM | POA: Diagnosis present

## 2023-07-11 DIAGNOSIS — I5020 Unspecified systolic (congestive) heart failure: Secondary | ICD-10-CM | POA: Diagnosis not present

## 2023-07-11 DIAGNOSIS — I050 Rheumatic mitral stenosis: Secondary | ICD-10-CM | POA: Diagnosis present

## 2023-07-11 DIAGNOSIS — M19011 Primary osteoarthritis, right shoulder: Secondary | ICD-10-CM | POA: Diagnosis present

## 2023-07-11 DIAGNOSIS — I2489 Other forms of acute ischemic heart disease: Secondary | ICD-10-CM | POA: Diagnosis present

## 2023-07-11 DIAGNOSIS — J449 Chronic obstructive pulmonary disease, unspecified: Secondary | ICD-10-CM | POA: Diagnosis present

## 2023-07-11 DIAGNOSIS — I509 Heart failure, unspecified: Secondary | ICD-10-CM | POA: Diagnosis not present

## 2023-07-11 DIAGNOSIS — N179 Acute kidney failure, unspecified: Secondary | ICD-10-CM | POA: Diagnosis present

## 2023-07-11 DIAGNOSIS — Z5900 Homelessness unspecified: Secondary | ICD-10-CM | POA: Diagnosis not present

## 2023-07-11 DIAGNOSIS — I5043 Acute on chronic combined systolic (congestive) and diastolic (congestive) heart failure: Secondary | ICD-10-CM | POA: Diagnosis present

## 2023-07-11 DIAGNOSIS — I252 Old myocardial infarction: Secondary | ICD-10-CM | POA: Diagnosis not present

## 2023-07-11 DIAGNOSIS — I5082 Biventricular heart failure: Secondary | ICD-10-CM | POA: Diagnosis present

## 2023-07-11 DIAGNOSIS — I2510 Atherosclerotic heart disease of native coronary artery without angina pectoris: Secondary | ICD-10-CM | POA: Diagnosis present

## 2023-07-11 DIAGNOSIS — J189 Pneumonia, unspecified organism: Secondary | ICD-10-CM | POA: Diagnosis present

## 2023-07-11 DIAGNOSIS — J45909 Unspecified asthma, uncomplicated: Secondary | ICD-10-CM | POA: Diagnosis present

## 2023-07-11 DIAGNOSIS — F1010 Alcohol abuse, uncomplicated: Secondary | ICD-10-CM | POA: Diagnosis present

## 2023-07-11 DIAGNOSIS — E876 Hypokalemia: Secondary | ICD-10-CM | POA: Diagnosis not present

## 2023-07-11 DIAGNOSIS — Z1152 Encounter for screening for COVID-19: Secondary | ICD-10-CM | POA: Diagnosis not present

## 2023-07-11 DIAGNOSIS — I130 Hypertensive heart and chronic kidney disease with heart failure and stage 1 through stage 4 chronic kidney disease, or unspecified chronic kidney disease: Secondary | ICD-10-CM | POA: Diagnosis present

## 2023-07-11 DIAGNOSIS — J850 Gangrene and necrosis of lung: Secondary | ICD-10-CM | POA: Diagnosis present

## 2023-07-11 DIAGNOSIS — I255 Ischemic cardiomyopathy: Secondary | ICD-10-CM | POA: Diagnosis present

## 2023-07-11 DIAGNOSIS — F17210 Nicotine dependence, cigarettes, uncomplicated: Secondary | ICD-10-CM | POA: Diagnosis present

## 2023-07-11 DIAGNOSIS — Z7901 Long term (current) use of anticoagulants: Secondary | ICD-10-CM | POA: Diagnosis not present

## 2023-07-11 DIAGNOSIS — I5A Non-ischemic myocardial injury (non-traumatic): Secondary | ICD-10-CM | POA: Diagnosis present

## 2023-07-11 DIAGNOSIS — I69331 Monoplegia of upper limb following cerebral infarction affecting right dominant side: Secondary | ICD-10-CM | POA: Diagnosis not present

## 2023-07-11 DIAGNOSIS — F1420 Cocaine dependence, uncomplicated: Secondary | ICD-10-CM | POA: Diagnosis present

## 2023-07-11 LAB — SEDIMENTATION RATE: Sed Rate: 40 mm/h — ABNORMAL HIGH (ref 0–16)

## 2023-07-11 LAB — RAPID URINE DRUG SCREEN, HOSP PERFORMED
Amphetamines: NOT DETECTED
Barbiturates: NOT DETECTED
Benzodiazepines: NOT DETECTED
Cocaine: POSITIVE — AB
Opiates: NOT DETECTED
Tetrahydrocannabinol: NOT DETECTED

## 2023-07-11 LAB — CBC WITH DIFFERENTIAL/PLATELET
Abs Immature Granulocytes: 0.03 10*3/uL (ref 0.00–0.07)
Basophils Absolute: 0 10*3/uL (ref 0.0–0.1)
Basophils Relative: 0 %
Eosinophils Absolute: 0.5 10*3/uL (ref 0.0–0.5)
Eosinophils Relative: 5 %
HCT: 31.1 % — ABNORMAL LOW (ref 39.0–52.0)
Hemoglobin: 9.9 g/dL — ABNORMAL LOW (ref 13.0–17.0)
Immature Granulocytes: 0 %
Lymphocytes Relative: 23 %
Lymphs Abs: 2.3 10*3/uL (ref 0.7–4.0)
MCH: 29 pg (ref 26.0–34.0)
MCHC: 31.8 g/dL (ref 30.0–36.0)
MCV: 91.2 fL (ref 80.0–100.0)
Monocytes Absolute: 0.4 10*3/uL (ref 0.1–1.0)
Monocytes Relative: 4 %
Neutro Abs: 6.9 10*3/uL (ref 1.7–7.7)
Neutrophils Relative %: 68 %
Platelets: 292 10*3/uL (ref 150–400)
RBC: 3.41 MIL/uL — ABNORMAL LOW (ref 4.22–5.81)
RDW: 17 % — ABNORMAL HIGH (ref 11.5–15.5)
WBC: 10.2 10*3/uL (ref 4.0–10.5)
nRBC: 0 % (ref 0.0–0.2)

## 2023-07-11 LAB — TROPONIN I (HIGH SENSITIVITY)
Troponin I (High Sensitivity): 41 ng/L — ABNORMAL HIGH (ref <18)
Troponin I (High Sensitivity): 42 ng/L — ABNORMAL HIGH (ref <18)

## 2023-07-11 LAB — URINALYSIS, ROUTINE W REFLEX MICROSCOPIC
Bacteria, UA: NONE SEEN
Bilirubin Urine: NEGATIVE
Glucose, UA: NEGATIVE mg/dL
Hgb urine dipstick: NEGATIVE
Ketones, ur: NEGATIVE mg/dL
Leukocytes,Ua: NEGATIVE
Nitrite: NEGATIVE
Protein, ur: 100 mg/dL — AB
Specific Gravity, Urine: 1.023 (ref 1.005–1.030)
pH: 6 (ref 5.0–8.0)

## 2023-07-11 LAB — RESPIRATORY PANEL BY PCR

## 2023-07-11 LAB — PHOSPHORUS: Phosphorus: 3.1 mg/dL (ref 2.5–4.6)

## 2023-07-11 LAB — BASIC METABOLIC PANEL
Anion gap: 11 (ref 5–15)
BUN: 23 mg/dL — ABNORMAL HIGH (ref 6–20)
CO2: 24 mmol/L (ref 22–32)
Calcium: 8.8 mg/dL — ABNORMAL LOW (ref 8.9–10.3)
Chloride: 101 mmol/L (ref 98–111)
Creatinine, Ser: 1.45 mg/dL — ABNORMAL HIGH (ref 0.61–1.24)
GFR, Estimated: 59 mL/min — ABNORMAL LOW (ref >60)
Glucose, Bld: 151 mg/dL — ABNORMAL HIGH (ref 70–99)
Potassium: 3.2 mmol/L — ABNORMAL LOW (ref 3.5–5.1)
Sodium: 136 mmol/L (ref 135–145)

## 2023-07-11 LAB — RESP PANEL BY RT-PCR (RSV, FLU A&B, COVID)  RVPGX2
Influenza A by PCR: NEGATIVE
Influenza B by PCR: NEGATIVE
Resp Syncytial Virus by PCR: NEGATIVE
SARS Coronavirus 2 by RT PCR: NEGATIVE

## 2023-07-11 LAB — MRSA NEXT GEN BY PCR, NASAL: MRSA by PCR Next Gen: NOT DETECTED

## 2023-07-11 LAB — MAGNESIUM: Magnesium: 2 mg/dL (ref 1.7–2.4)

## 2023-07-11 LAB — BRAIN NATRIURETIC PEPTIDE: B Natriuretic Peptide: 2597.3 pg/mL — ABNORMAL HIGH (ref 0.0–100.0)

## 2023-07-11 LAB — PROCALCITONIN: Procalcitonin: <0.1 ng/mL

## 2023-07-11 LAB — C-REACTIVE PROTEIN: CRP: 5.6 mg/dL — ABNORMAL HIGH (ref <1.0)

## 2023-07-11 MED ORDER — ENOXAPARIN SODIUM 40 MG/0.4ML IJ SOSY
40.0000 mg | PREFILLED_SYRINGE | INTRAMUSCULAR | Status: DC
  Administered 2023-07-11 – 2023-07-13 (×3): 40 mg via SUBCUTANEOUS
  Filled 2023-07-11 (×3): qty 0.4

## 2023-07-11 MED ORDER — CARVEDILOL 3.125 MG PO TABS
3.1250 mg | ORAL_TABLET | Freq: Two times a day (BID) | ORAL | Status: DC
  Administered 2023-07-11 – 2023-07-15 (×9): 3.125 mg via ORAL
  Filled 2023-07-11 (×9): qty 1

## 2023-07-11 MED ORDER — SODIUM CHLORIDE 0.9 % IV SOLN: 2.0000 g | Freq: Two times a day (BID) | INTRAVENOUS | Status: DC

## 2023-07-11 MED ORDER — METRONIDAZOLE 500 MG PO TABS: 500.0000 mg | ORAL_TABLET | Freq: Two times a day (BID) | ORAL | Status: DC

## 2023-07-11 MED ORDER — HYDROCORT-PRAMOXINE (PERIANAL) 2.5-1 % EX CREA
TOPICAL_CREAM | Freq: Three times a day (TID) | CUTANEOUS | Status: DC
  Administered 2023-07-11 – 2023-07-12 (×2): 1 via RECTAL
  Filled 2023-07-11 (×2): qty 30

## 2023-07-11 MED ORDER — AMOXICILLIN-POT CLAVULANATE 875-125 MG PO TABS
1.0000 | ORAL_TABLET | Freq: Two times a day (BID) | ORAL | Status: DC
Start: 2023-07-11 — End: 2023-07-12
  Administered 2023-07-11: 1 via ORAL
  Filled 2023-07-11 (×2): qty 1

## 2023-07-11 MED ORDER — NICOTINE 7 MG/24HR TD PT24
7.0000 mg | MEDICATED_PATCH | Freq: Every day | TRANSDERMAL | Status: DC
  Administered 2023-07-11 – 2023-07-15 (×5): 7 mg via TRANSDERMAL
  Filled 2023-07-11 (×5): qty 1

## 2023-07-11 MED ORDER — FUROSEMIDE 10 MG/ML IJ SOLN
40.0000 mg | Freq: Two times a day (BID) | INTRAMUSCULAR | Status: DC
  Administered 2023-07-11 – 2023-07-12 (×3): 40 mg via INTRAVENOUS
  Filled 2023-07-11 (×3): qty 4

## 2023-07-11 MED ORDER — SODIUM CHLORIDE 0.9 % IV SOLN
2.0000 g | Freq: Once | INTRAVENOUS | Status: AC
  Administered 2023-07-11: 2 g via INTRAVENOUS
  Filled 2023-07-11: qty 12.5

## 2023-07-11 MED ORDER — FUROSEMIDE 10 MG/ML IJ SOLN
40.0000 mg | INTRAMUSCULAR | Status: AC
  Administered 2023-07-11: 40 mg via INTRAVENOUS
  Filled 2023-07-11: qty 4

## 2023-07-11 MED ORDER — ATORVASTATIN CALCIUM 40 MG PO TABS
40.0000 mg | ORAL_TABLET | Freq: Every day | ORAL | Status: DC
Start: 2023-07-11 — End: 2023-07-15
  Administered 2023-07-11 – 2023-07-15 (×5): 40 mg via ORAL
  Filled 2023-07-11 (×5): qty 1

## 2023-07-11 MED ORDER — ALBUTEROL SULFATE (2.5 MG/3ML) 0.083% IN NEBU: 2.5000 mg | INHALATION_SOLUTION | RESPIRATORY_TRACT | Status: DC | PRN

## 2023-07-11 MED ORDER — UMECLIDINIUM BROMIDE 62.5 MCG/ACT IN AEPB
1.0000 | INHALATION_SPRAY | Freq: Every day | RESPIRATORY_TRACT | Status: DC
Start: 2023-07-11 — End: 2023-07-15
  Administered 2023-07-12 – 2023-07-15 (×4): 1 via RESPIRATORY_TRACT
  Filled 2023-07-11: qty 7

## 2023-07-11 MED ORDER — POTASSIUM CHLORIDE 20 MEQ PO PACK
40.0000 meq | PACK | Freq: Once | ORAL | Status: AC
  Administered 2023-07-11: 40 meq via ORAL
  Filled 2023-07-11: qty 2

## 2023-07-11 MED ORDER — VANCOMYCIN HCL IN DEXTROSE 1-5 GM/200ML-% IV SOLN
1000.0000 mg | Freq: Once | INTRAVENOUS | Status: AC
  Administered 2023-07-11: 1000 mg via INTRAVENOUS
  Filled 2023-07-11: qty 200

## 2023-07-11 MED ORDER — CLOPIDOGREL BISULFATE 75 MG PO TABS
75.0000 mg | ORAL_TABLET | Freq: Every day | ORAL | Status: DC
  Administered 2023-07-11 – 2023-07-13 (×3): 75 mg via ORAL
  Filled 2023-07-11 (×3): qty 1

## 2023-07-11 MED ORDER — NICOTINE POLACRILEX 2 MG MT GUM: 2.0000 mg | CHEWING_GUM | OROMUCOSAL | Status: DC | PRN

## 2023-07-11 MED ORDER — SODIUM CHLORIDE 0.9% FLUSH
3.0000 mL | Freq: Two times a day (BID) | INTRAVENOUS | Status: DC
  Administered 2023-07-11 – 2023-07-15 (×8): 3 mL via INTRAVENOUS

## 2023-07-11 MED ORDER — ACETAMINOPHEN 500 MG PO TABS
1000.0000 mg | ORAL_TABLET | Freq: Four times a day (QID) | ORAL | Status: DC | PRN
  Administered 2023-07-11 – 2023-07-15 (×3): 1000 mg via ORAL
  Filled 2023-07-11 (×4): qty 2

## 2023-07-11 NOTE — H&P (Signed)
History and Physical    Craig Schmidt WGN:[id number] DOB: 08/17/1970 DOA: 07/10/2023  PCP: Rema Fendt, NP   Patient coming from:  Street - currently homeless    Chief Complaint:  Chief Complaint  Patient presents with   Weakness    HPI:  Craig Schmidt is a 52 y.o. 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  Since discharge on 12/12 reports progressive worsening of dyspnea both at rest and with exertion.  He is unable to quantify how far he can go without stopping.  Reports episodic nature of symptoms.  Cough has been worsening although is nonproductive.  Swelling in his legs worse especially in the past few days, has some orthopnea associated.  Reports he has missed some doses of Augmentin but claims he still has the bottle.  Last crack use was 3 days ago, he appears motivated to stop substance use and wants to improve his health, asking to speak to social work for resources on substance use.  Current smoker at 1/2 pack a day.    Review of Systems:  ROS complete and negative except as marked above   Allergies  Allergen Reactions   Shellfish Allergy Anaphylaxis   Peanut (Diagnostic) Itching    Prior to Admission medications   Medication Sig Start Date End Date Taking? Authorizing Provider  amoxicillin-clavulanate (AUGMENTIN) 875-125 MG tablet Take 1 tablet by mouth every 12 (twelve) hours for 28 days. 06/22/23 07/20/23  DanfordEarl Lites,  MD  atorvastatin (LIPITOR) 40 MG tablet Take 1 tablet (40 mg total) by mouth daily. 06/10/23   Marinda Elk, MD  carvedilol (COREG) 3.125 MG tablet Take 1 tablet (3.125 mg total) by mouth 2 (two) times daily. 06/10/23   Marinda Elk, MD  clopidogrel (PLAVIX) 75 MG tablet Take 1 tablet (75 mg total) by mouth daily. 06/10/23   Marinda Elk, MD  furosemide (LASIX) 40 MG tablet Take 1 tablet (40 mg total) by mouth daily. Patient taking differently: Take 80 mg by mouth daily. 06/22/23 07/22/23  Danford, Earl Lites, MD  oxyCODONE-acetaminophen (PERCOCET) 5-325 MG tablet Take 1 tablet by mouth every 6 (six) hours as needed. 07/10/23   Bethann Berkshire, MD  umeclidinium bromide (INCRUSE ELLIPTA) 62.5 MCG/ACT AEPB Inhale 1 puff into the lungs daily. 06/23/23 07/23/23  Danford, Earl Lites, MD  apixaban (ELIQUIS) 5 MG TABS tablet Take 1 tablet (5 mg total) by mouth 2 (two) times daily. 06/22/23 06/22/23  Alberteen Sam, MD    Past Medical History:  Diagnosis Date   Alcohol abuse    Asthma    Brain tumor (HCC)    CAD (coronary artery disease)    Chronic HFrEF (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)    Chronic kidney disease, stage 3 (HCC)    Cocaine abuse (HCC)    History of medication noncompliance    Homelessness    MI (myocardial infarction) (HCC)    STEMI (ST elevation myocardial infarction) (HCC)    Stroke [facility])     Past Surgical History:  Procedure Laterality  Date   BRONCHIAL NEEDLE ASPIRATION BIOPSY  07/03/2023   Procedure: BRONCHIAL NEEDLE ASPIRATION BIOPSIES;  Surgeon: Lorin Glass, MD;  Location: [facility] ENDOSCOPY;  Service: Pulmonary;;   BRONCHIAL WASHINGS  07/03/2023   Procedure: BRONCHIAL WASHINGS;  Surgeon: Lorin Glass, MD;  Location: [facility] ENDOSCOPY;  Service: Pulmonary;;   BUBBLE STUDY  09/11/2021   Procedure: BUBBLE STUDY;  Surgeon: Pricilla Riffle, MD;  Location: [facility] ENDOSCOPY;  Service: Cardiovascular;;   LEFT HEART CATH AND  CORONARY ANGIOGRAPHY N/A 12/19/2021   Procedure: LEFT HEART CATH AND CORONARY ANGIOGRAPHY;  Surgeon: Lyn Records, MD;  Location: [facility] INVASIVE CV LAB;  Service: Cardiovascular;  Laterality: N/A;   RIGHT/LEFT HEART CATH AND CORONARY ANGIOGRAPHY N/A 04/13/2022   Procedure: RIGHT/LEFT HEART CATH AND CORONARY ANGIOGRAPHY;  Surgeon: Laurey Morale, MD;  Location: [facility] INVASIVE CV LAB;  Service: Cardiovascular;  Laterality: N/A;   TEE WITHOUT CARDIOVERSION N/A 09/11/2021   Procedure: TRANSESOPHAGEAL ECHOCARDIOGRAM (TEE);  Surgeon: Pricilla Riffle, MD;  Location: [facility] ENDOSCOPY;  Service: Cardiovascular;  Laterality: N/A;   VIDEO BRONCHOSCOPY WITH ENDOBRONCHIAL ULTRASOUND N/A 07/03/2023   Procedure: VIDEO BRONCHOSCOPY WITH ENDOBRONCHIAL ULTRASOUND;  Surgeon: Lorin Glass, MD;  Location: [facility] ENDOSCOPY;  Service: Pulmonary;  Laterality: N/A;     reports that he has been smoking cigarettes. He has a 41 pack-year smoking history. He has been exposed to tobacco smoke. He has never used smokeless tobacco. He reports current alcohol use of about 63.0 standard drinks of alcohol per week. He reports current drug use. Drugs: "Crack" cocaine and Cocaine.  Family History  Problem Relation Age of Onset   Diabetes Mother      Physical Exam: Vitals:   07/10/23 1814 07/10/23 2246 07/11/23 0215 07/11/23 0248  BP: (!) 143/96 (!) 134/100 (!) 142/96   Pulse: (!) 110 (!) 102 93 99  Resp: 16 16 (!) 26 17  Temp: 98 F (36.7 C) 97.9 F (36.6 C)  98.4 F (36.9 C)  TempSrc: Oral Oral  Oral  SpO2: 100% 99% 100% 100%  Weight:      Height:        Gen: Awake, alert, chronically ill-appearing CV: Regular, normal S1, S2, no murmurs  Resp: Normal WOB, diminished breath sounds in the left lower greater than the right lower lung fields, rales in the left lower fields Abd: Flat, normoactive, nontender MSK: Asymmetric with right lower extremity greater than the left, 2+ on the right, 1+ on the left to bring to the  knee. Skin: Diffuse xerosis and scaling Neuro: Alert and interactive, right upper extremity hemiparesis  Psych: euthymic, appropriate    Data review:   Labs reviewed, notable for:   Recent BAL on 12/12 no organisms seen, bacterial culture negative. AFB smear was negative, AFB cultures pending Fungal KOH was negative, fungal cultures pending Additional prior infectious evaluation including Fungitell pending, added Aspergillus antigen, histo urine, blasto urine ESR and CRP pending, procalcitonin is pending Additional inflammatory evaluation including ANCA RF CCP C3-C4, antiscleroderma, aldolase are pending  BMP 2597, uptrending Trop high-sensitivity troponin 41 -> 42 Creatinine 1.5 up from prior WBC 10  Micro:  Results for orders placed or performed during the hospital encounter of 07/10/23  MRSA Next Gen by PCR, Nasal     Status: None   Collection Time: 07/11/23  3:39 AM   Specimen: Nasal Mucosa; Nasal Swab  Result Value Ref Range Status   MRSA by PCR Next Gen NOT DETECTED NOT DETECTED Final    Comment: (NOTE)  The GeneXpert MRSA Assay (FDA approved for NASAL specimens only), is one component of a comprehensive MRSA colonization surveillance program. It is not intended to diagnose MRSA infection nor to guide or monitor treatment for MRSA infections. Test performance is not FDA approved in patients less than 37 years old. Performed at [facility], [address], [place], [place] [address]   Resp panel by RT-PCR (RSV, Flu A&B, Covid) Nasal Mucosa     Status: None   Collection Time: 07/11/23  3:46 AM   Specimen: Nasal Mucosa; Nasal Swab  Result Value Ref Range Status   SARS Coronavirus 2 by RT PCR NEGATIVE NEGATIVE Final    Comment: (NOTE) SARS-CoV-2 target nucleic acids are NOT DETECTED.  The SARS-CoV-2 RNA is generally detectable in upper respiratory specimens during the acute phase of infection. The lowest concentration of SARS-CoV-2 viral copies this  assay can detect is 138 copies/mL. A negative result does not preclude SARS-Cov-2 infection and should not be used as the sole basis for treatment or other patient management decisions. A negative result may occur with  improper specimen collection/handling, submission of specimen other than nasopharyngeal swab, presence of viral mutation(s) within the areas targeted by this assay, and inadequate number of viral copies(<138 copies/mL). A negative result must be combined with clinical observations, patient history, and epidemiological information. The expected result is Negative.  Fact Sheet for Patients:  BloggerCourse.com  Fact Sheet for Healthcare Providers:  SeriousBroker.it  This test is no t yet approved or cleared by the [place] FDA and  has been authorized for detection and/or diagnosis of SARS-CoV-2 by FDA under an Emergency Use Authorization (EUA). This EUA will remain  in effect (meaning this test can be used) for the duration of the COVID-19 declaration under Section 564(b)(1) of the Act, 21 U.S.C.section 360bbb-3(b)(1), unless the authorization is terminated  or revoked sooner.       Influenza A by PCR NEGATIVE NEGATIVE Final   Influenza B by PCR NEGATIVE NEGATIVE Final    Comment: (NOTE) The Xpert Xpress SARS-CoV-2/FLU/RSV plus assay is intended as an aid in the diagnosis of influenza from Nasopharyngeal swab specimens and should not be used as a sole basis for treatment. Nasal washings and aspirates are unacceptable for Xpert Xpress SARS-CoV-2/FLU/RSV testing.  Fact Sheet for Patients: BloggerCourse.com  Fact Sheet for Healthcare Providers: SeriousBroker.it  This test is not yet approved or cleared by the [place] FDA and has been authorized for detection and/or diagnosis of SARS-CoV-2 by FDA under an Emergency Use Authorization (EUA). This EUA will  remain in effect (meaning this test can be used) for the duration of the COVID-19 declaration under Section 564(b)(1) of the Act, 21 U.S.C. section 360bbb-3(b)(1), unless the authorization is terminated or revoked.     Resp Syncytial Virus by PCR NEGATIVE NEGATIVE Final    Comment: (NOTE) Fact Sheet for Patients: BloggerCourse.com  Fact Sheet for Healthcare Providers: SeriousBroker.it  This test is not yet approved or cleared by the [place] FDA and has been authorized for detection and/or diagnosis of SARS-CoV-2 by FDA under an Emergency Use Authorization (EUA). This EUA will remain in effect (meaning this test can be used) for the duration of the COVID-19 declaration under Section 564(b)(1) of the Act, 21 U.S.C. section 360bbb-3(b)(1), unless the authorization is terminated or revoked.  Performed at [facility], [address] [place], [place] [address]     Imaging reviewed:  CT CHEST WO CONTRAST Result Date: 07/11/2023 CLINICAL DATA:  Focal pneumonia with necrotizing component. Recent Val EXAM: CT CHEST WITHOUT CONTRAST TECHNIQUE: Multidetector CT imaging of the chest was performed following the standard protocol without IV contrast. RADIATION DOSE REDUCTION: This exam was performed according to the departmental dose-optimization program which includes automated exposure control, adjustment of the mA and/or kV according to patient size and/or use of iterative reconstruction technique. COMPARISON:  Chest CT 06/26/2023 FINDINGS: Cardiovascular: Cardiac enlargement. No pericardial effusion. No acute vascular finding without contrast. Mediastinum/Nodes: Thickening of mediastinal lymph nodes similar to prior and likely reactive given the pulmonary findings and history. No interval change Lungs/Pleura: There are areas of peripheral consolidative opacity in the bilateral lungs without progression. History of  necrotizing component, with minimal superimposed lucency at the consolidation in the posterior right and lateral left lower lobes. There is diffuse airway cuffing, interlobular septal thickening, fissure thickening, and trace pleural fluid. Some mosaic attenuation of the upper lungs likely related to small airways disease and the airway thickening. 8 mm nodule in the right upper lobe on 2:20, not significantly changed since 2016 chest CT, consistent with benign process. Upper Abdomen: No acute finding Musculoskeletal: No acute finding IMPRESSION: Similar degree of patchy bilateral pneumonia with subtle cavitary features. CHF. Electronically Signed   By: Tiburcio Pea M.D.   On: 07/11/2023 06:17   DG Chest Port 1 View Result Date: 07/11/2023 CLINICAL DATA:  Weakness and cough EXAM: PORTABLE CHEST 1 VIEW COMPARISON:  Chest x-ray 07/10/2023.  Chest CT 06/26/2023. FINDINGS: Patchy left lower lobe airspace disease has slightly increased. Focal slightly nodular airspace disease in the right lung base has slightly increased. Heart is mildly enlarged. There is no pleural effusion or pneumothorax. No acute fractures are seen. IMPRESSION: Increase in patchy left lower lobe airspace disease and increase in slightly nodular airspace disease in the right lung base. Findings are concerning for pneumonia. Follow-up chest x-ray recommended in 4-6 weeks to ensure resolution. Electronically Signed   By: Darliss Cheney M.D.   On: 07/11/2023 03:05   DG Chest Port 1 View Result Date: 07/10/2023 CLINICAL DATA:  Chest pain EXAM: PORTABLE CHEST 1 VIEW COMPARISON:  06/26/2023 FINDINGS: Chronic cardiomegaly. Hazy opacity at the right base, airspace opacity adjacent to the pleura by recent chest CT. Improved aeration in the left mid lung. Trace bilateral pleural fluid. No pneumothorax. IMPRESSION: 1. Stable to improved opacities in the lower lungs seen on chest CT 06/26/2023. Recommend continued follow-up to clearing. 2. Chronic  cardiomegaly. Electronically Signed   By: Tiburcio Pea M.D.   On: 07/10/2023 07:47    Historical data:  TTE 06/27/23 IMPRESSIONS   1. Left ventricular ejection fraction, by estimation, is <20%. The left  ventricle has severely decreased function. The left ventricle demonstrates  global hypokinesis. The left ventricular internal cavity size was mildly  to moderately dilated. Left  ventricular diastolic parameters are consistent with Grade II diastolic  dysfunction (pseudonormalization).   2. Right ventricular systolic function is moderately reduced. The right  ventricular size is mildly enlarged.   3. Left atrial size was severely dilated.   4. Right atrial size was mildly dilated.   5. The mitral valve is grossly normal. There is mild thickening of the  mitral valve with restricted excursion of valve leaflets. Moderate mitral  stenosis.   6. The aortic valve is normal in structure. Aortic valve regurgitation is  trivial.   7. No overt signs of endocarditis.   8. The inferior vena cava is normal in size with greater than 50%  respiratory variability, suggesting right atrial pressure of 3 mmHg.   EKG:   Sinus rhythm, borderline biatrial enlargement, question repolarization abnormality anterior leads, otherwise diffuse T wave flattening, inferior Q waves  ED Course:  Chest x-ray obtained concerning for possible progression of airspace disease, started on vancomycin, cefepime.  Otherwise treated with Lasix 40 mg IV x 1.   Assessment/Plan:  52 y.o. 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  Imaging findings of prior pneumonia are stable, suspect symptoms more so related to heart failure.   Biventricular failure, combined systolic and diastolic, with acute exacerbation  Moderate mitral valve stenosis, Last seen by cardiology while inpatient in 5/'24, GDMT previously limited by renal insufficiency. TTE last on 06/27/23 included above, with progressive worsening LV function, LVEF less than 20%, RV moderately reduced systolic function. Exam with peripheral edema, JVD, BMP 2597, uptrending Trop high-sensitivity troponin 41 -> 42. CT imaging with stable findings of pneumonia although features of CHF. Etiology of continued decline in function suspect in part from demand ischemia from his recent infection and ongoing crack use. Question if some of his exertional symptoms may be related to symptomatic mitral valve stenosis. Dry weight is approximately 61 kg=134 pounds (on 11/27), recent uptrend per weight history  -Cardiology consult in the morning, not contacted overnight -Status post Lasix 40 mg IV x 1 in the ED, scheduled BID for now  -Daily weights and strict ins and out -GDMT: continue home coreg, not on other GDMT at present, defer changes to cardiology  -For ischemic cardiomyopathy continue Plavix, atorvastatin -Management of substance use per below  History of a multifocal pneumonia with necrotizing component,  status post recent BAL on 12/11 with negative bacterial culture and AFB/fungal cultures as well as inflammatory evaluation still pending per below.  CT chest demonstrates similar degree of pneumonia compared to prior CT on 12/4.  However there are findings of CHF per above.  -Status post vancomycin, cefepime in the ED.  Considering stable findings of pneumonia transition back to Augmentin 875/125 twice daily; had been planned for at least 4-week  course with prior plan for repeat CT imaging and need to have reevaluation by pulmonology to determine if course should be extended (current Rx provided EOT 12/27).  -Pulmonology consult in the morning, not contacted overnight to determine if course should be extended past 12/27 -Consider infectious disease evaluation considering persistent nature of his pneumonia  -Re: workup previously:  recent BAL on 12/12 no organisms seen, bacterial culture negative. AFB smear was negative, AFB cultures pending Fungal KOH was negative, fungal cultures pending Additional prior infectious evaluation including Fungitell pending, added Aspergillus antigen, histo urine, blasto urine ESR and CRP pending, procalcitonin is pending Additional inflammatory evaluation including ANCA RF CCP C3-C4, antiscleroderma, aldolase are pending  Acute myocardial injury Denies symptoms of exertional chest pain.  Does have right-sided chest pain suspect related to underlying pneumonia.  High-sensitivity troponin low level elevated and flat at 41 -> 42.  EKG has findings of old ischemic changes, although no acute findings.  Suspect demand ischemia in the  setting of heart failure exacerbation, crack use, resolving infection. -Management directed at pneumonia, heart failure, substance use  AKI stage I Background CKD stage II Baseline creatinine approximately 1.1, elevated 1.5 at admission.  Suspected cardiorenal - Diuresis per above  Substance use disorder Current smoking Reports last use of crack 3 days ago.  Continue smoking 1/2 pack/day.  Girlfriend whom he is around also substance user, smoker not a supportive environment for his abstinence.  Although he sounds motivated to stop using, thus far has had chronic use despite his medical issues. -TOC consult for substance use resources - Nicotine 7 mg patch and gum prn.  Provide prescription for NRT on discharge  Chronic medical problems: History of stroke suspected  cardioembolic: Note that his apixaban was discontinued during hospitalization 11/'24 although cannot locate documentation to explain.  Possibly in the setting of his necrotizing pneumonia.  Will need to clarify with neurology if needs continued anticoagulation therapy Suspected COPD: Continue home Incruse Ellipta, albuterol prn  Body mass index is 23.82 kg/m.    DVT prophylaxis:  Lovenox Code Status:  Full Code Diet:  Diet Orders (From admission, onward)     Start     Ordered   07/11/23 0413  Diet regular Room service appropriate? Yes; Fluid consistency: Thin  Diet effective now       Question Answer Comment  Room service appropriate? Yes   Fluid consistency: Thin      07/11/23 0419           Family Communication:  No   Consults:  None   Admission status:   Inpatient, Telemetry bed  Severity of Illness: The appropriate patient status for this patient is INPATIENT. Inpatient status is judged to be reasonable and necessary in order to provide the required intensity of service to ensure the patient's safety. The patient's presenting symptoms, physical exam findings, and initial radiographic and laboratory data in the context of their chronic comorbidities is felt to place them at high risk for further clinical deterioration. Furthermore, it is not anticipated that the patient will be medically stable for discharge from the hospital within 2 midnights of admission.   * I certify that at the point of admission it is my clinical judgment that the patient will require inpatient hospital care spanning beyond 2 midnights from the point of admission due to high intensity of service, high risk for further deterioration and high frequency of surveillance required.*   Dolly Rias, MD [facility]  How to contact the [facility] Attending or Consulting provider 7A - 7P or covering provider during after hours 7P -7A, for this patient.  Check the care team in [facility] and look for a)  attending/consulting [facility] provider listed and b) the [facility] team listed Log into [email or web address] and use [place]'s universal password to access. If you do not have the password, please contact the hospital operator. Locate the [facility] provider you are looking for under [facility] and page to a number that you can be directly reached. If you still have difficulty reaching the provider, please page the [facility] (Director on Call) for the Hospitalists listed on amion for assistance.  07/11/2023, 6:57 AM

## 2023-07-11 NOTE — ED Provider Notes (Signed)
[facility]-EMERGENCY DEPT [facility] Emergency Department Provider Note MRN:  [id number]  Arrival date & time: 07/11/23     Chief Complaint   Weakness   History of Present Illness   Craig Schmidt is a 52 y.o. year-old male presents to the ED with chief complaint of SOB and leg swelling.  He was seen earlier today for the same and discharged after having been given a dose of lasix.  He reports recent admission for pneumonia and reports that he has been taking the antibiotics, but continues to feel bad.  He still complains of cough and of chest pain when he coughs.  He denies fever.  He states that the swelling on his legs is worsening.  He complains of exertional SOB and SOB at rest.  History provided by patient.   Review of Systems  Pertinent positive and negative review of systems noted in HPI.    Physical Exam   Vitals:   07/11/23 0215 07/11/23 0248  BP: (!) 142/96   Pulse: 93 99  Resp: (!) 26 17  Temp:  98.4 F (36.9 C)  SpO2: 100% 100%    CONSTITUTIONAL:  chronically ill-appearing, NAD NEURO:  Alert and oriented x 3, CN 3-12 grossly intact EYES:  eyes equal and reactive ENT/NECK:  Supple, no stridor  CARDIO:  tachycardic, regular rhythm, appears well-perfused  PULM:  No respiratory distress, diminished GI/GU:  non-distended,  MSK/SPINE:  No gross deformities, 2+ pitting edema, moves all extremities  SKIN:  no rash, atraumatic   *Additional and/or pertinent findings included in MDM below  Diagnostic and Interventional Summary    EKG Interpretation Date/Time:    Ventricular Rate:    PR Interval:    QRS Duration:    QT Interval:    QTC Calculation:   R Axis:      Text Interpretation:         Labs Reviewed  BASIC METABOLIC PANEL - Abnormal; Notable for the following components:      Result Value   Glucose, Bld 115 (*)    BUN 27 (*)    Creatinine, Ser 1.54 (*)    GFR, Estimated 55 (*)    All other components within normal limits  CBC - Abnormal;  Notable for the following components:   RBC 3.52 (*)    Hemoglobin 10.3 (*)    HCT 31.4 (*)    RDW 16.8 (*)    All other components within normal limits  URINALYSIS, ROUTINE W REFLEX MICROSCOPIC - Abnormal; Notable for the following components:   Protein, ur 100 (*)    All other components within normal limits  BRAIN NATRIURETIC PEPTIDE - Abnormal; Notable for the following components:   B Natriuretic Peptide 2,597.3 (*)    All other components within normal limits  TROPONIN I (HIGH SENSITIVITY) - Abnormal; Notable for the following components:   Troponin I (High Sensitivity) 41 (*)    All other components within normal limits  MRSA NEXT GEN BY PCR, NASAL  RESP PANEL BY RT-PCR (RSV, FLU A&B, COVID)  [id number]  BLASTOMYCES ANTIGEN  ASPERGILLUS ANTIGEN, BAL/SERUM  BASIC METABOLIC PANEL  MAGNESIUM  PHOSPHORUS  SEDIMENTATION RATE  C-REACTIVE PROTEIN  PROCALCITONIN  HISTOPLASMA ANTIGEN, URINE  CBC WITH DIFFERENTIAL/PLATELET  RAPID URINE DRUG SCREEN, HOSP PERFORMED  CBG MONITORING, ED  TROPONIN I (HIGH SENSITIVITY)    DG Chest Port 1 View  Final Result    CT CHEST WO CONTRAST    (Results Pending)    Medications  ceFEPIme (MAXIPIME)  2 g in sodium chloride 0.9 % 100 mL IVPB (has no administration in time range)  furosemide (LASIX) injection 40 mg (has no administration in time range)  vancomycin (VANCOCIN) IVPB 1000 mg/200 mL premix (has no administration in time range)  enoxaparin (LOVENOX) injection 40 mg (has no administration in time range)  sodium chloride flush (NS) 0.9 % injection 3 mL (has no administration in time range)  acetaminophen (TYLENOL) tablet 1,000 mg (has no administration in time range)     Procedures  /  Critical Care .Critical Care  Performed by: Roxy Horseman, [name] Authorized by: Roxy Horseman, [name]   Critical care provider statement:    Critical care time (minutes):  34   Critical care was necessary to treat or prevent imminent or  life-threatening deterioration of the following conditions:  Respiratory failure   Critical care was time spent personally by me on the following activities:  Development of treatment plan with patient or surrogate, discussions with consultants, evaluation of patient's response to treatment, examination of patient, ordering and review of laboratory studies, ordering and review of radiographic studies, ordering and performing treatments and interventions, pulse oximetry, re-evaluation of patient's condition and review of old charts   ED Course and Medical Decision Making  I have reviewed the triage vital signs, the nursing notes, and pertinent available records from the EMR.  Social Determinants Affecting Complexity of Care: Patient has no clinically significant social determinants affecting this chief complaint..   ED Course: Clinical Course as of 07/11/23 0420  Thu Jul 11, 2023  0418 DG Chest [place] 1 View Patchy opacities present in left [RB]  [phone] Brain natriuretic peptide(!) Increase from previous.  Significant swelling on legs.  Will give some additional lasix, but he does have a bump in his creatinine from this morning, so might need gentle diuresis [RB]    Clinical Course User Index [RB] Roxy Horseman, [name]    Medical Decision Making Patient here with SOB.  Recent admission for pneumonia and CHF.  States that he is having worsening symptoms.  Was seen this morning for the same and given some lasix.    He has significant pitting edema in bilateral lower extremities on exam.  Still having coarse cough. Will repeat labs and CXR.      Amount and/or Complexity of Data Reviewed Labs: ordered. Decision-making details documented in ED Course. Radiology: ordered and independent interpretation performed. Decision-making details documented in ED Course.  Risk Prescription drug management. Decision regarding hospitalization.         Consultants: I consulted with  Hospitalist, Dr. Lazarus Salines, who is appreciated for admitting.   Treatment and Plan: Patient's exam and diagnostic results are concerning for CHF exacerbation vs worsening pneumonia or both.  Feel that patient will need admission to the hospital for further treatment and evaluation.    Final Clinical Impressions(s) / ED Diagnoses     ICD-10-CM   1. Multifocal pneumonia  J18.9     2. Acute on chronic congestive heart failure, unspecified heart failure type [facility])  I50.9       ED Discharge Orders     None         Discharge Instructions Discussed with and Provided to Patient:   Discharge Instructions   None      Roxy Horseman, [name] 07/11/23 0420    Gilda Crease, MD 07/11/23 [phone]

## 2023-07-11 NOTE — Progress Notes (Signed)
ED Pharmacy Antibiotic Sign Off An antibiotic consult was received from an ED provider for Vancomycin per pharmacy dosing for PNA. A chart review was completed to assess appropriateness.   The following one time order(s) were placed:  Vancomycin 1gm IV  MRSA PCR swab  Further antibiotic and/or antibiotic pharmacy consults should be ordered by the admitting provider if indicated.   Thank you for allowing pharmacy to be a part of this patient's care.   Junita Push, [facility]  Clinical Pharmacist 07/11/23 3:39 AM

## 2023-07-11 NOTE — ED Notes (Addendum)
ED TO INPATIENT HANDOFF REPORT  ED Nurse Name and Phone #: Rebecca Eaton [phone]  S Name/Age/Gender Craig Schmidt 52 y.o. male Room/Bed: WA14/WA14  Code Status   Code Status: Full Code  Home/SNF/Other Other Patient oriented to: self, time, place, situation Is this baseline? Yes   Triage Complete: Triage complete  Chief Complaint Multifocal pneumonia [J18.9]  Triage Note Pt biba picked [place] from ambulance [place] bus stop. Was d/c from hospital this morning for flank pain. Patient reports weakness  Old R side deficits from a stroke. Patient thinks he has pneumonia despite being r/o on admission earlier.   Allergies Allergies  Allergen Reactions   Shellfish Allergy Anaphylaxis   Peanut (Diagnostic) Itching    Level of Care/Admitting Diagnosis ED Disposition     ED Disposition  Admit   Condition  --   Comment  Hospital Area: [facility] [[id number]]  Level of Care: Telemetry [5]  Admit to tele based on following criteria: Other see comments  Comments: resp failure  May admit patient to [facility] or [facility] if equivalent level of care is available:: Yes  Covid Evaluation: Asymptomatic - no recent exposure (last 10 days) testing not required  Diagnosis: Multifocal pneumonia [[id number]]  Admitting Physician: Dolly Rias [[id number]]  Attending Physician: Dolly Rias [[id number]]  Certification:: I certify this patient will need inpatient services for at least 2 midnights  Expected Medical Readiness: 07/15/2023          B Medical/Surgery History Past Medical History:  Diagnosis Date   Alcohol abuse    Asthma    Brain tumor (HCC)    CAD (coronary artery disease)    Chronic HFrEF (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)    Chronic kidney disease, stage 3 (HCC)    Cocaine abuse (HCC)    History of medication noncompliance    Homelessness    MI (myocardial infarction) (HCC)    STEMI (ST elevation myocardial infarction)  (HCC)    Stroke [facility])    Past Surgical History:  Procedure Laterality Date   BRONCHIAL NEEDLE ASPIRATION BIOPSY  07/03/2023   Procedure: BRONCHIAL NEEDLE ASPIRATION BIOPSIES;  Surgeon: Lorin Glass, MD;  Location: [facility] ENDOSCOPY;  Service: Pulmonary;;   BRONCHIAL WASHINGS  07/03/2023   Procedure: BRONCHIAL WASHINGS;  Surgeon: Lorin Glass, MD;  Location: [facility] ENDOSCOPY;  Service: Pulmonary;;   BUBBLE STUDY  09/11/2021   Procedure: BUBBLE STUDY;  Surgeon: Pricilla Riffle, MD;  Location: [facility] ENDOSCOPY;  Service: Cardiovascular;;   LEFT HEART CATH AND CORONARY ANGIOGRAPHY N/A 12/19/2021   Procedure: LEFT HEART CATH AND CORONARY ANGIOGRAPHY;  Surgeon: Lyn Records, MD;  Location: [facility] INVASIVE CV LAB;  Service: Cardiovascular;  Laterality: N/A;   RIGHT/LEFT HEART CATH AND CORONARY ANGIOGRAPHY N/A 04/13/2022   Procedure: RIGHT/LEFT HEART CATH AND CORONARY ANGIOGRAPHY;  Surgeon: Laurey Morale, MD;  Location: [facility] INVASIVE CV LAB;  Service: Cardiovascular;  Laterality: N/A;   TEE WITHOUT CARDIOVERSION N/A 09/11/2021   Procedure: TRANSESOPHAGEAL ECHOCARDIOGRAM (TEE);  Surgeon: Pricilla Riffle, MD;  Location: [facility] ENDOSCOPY;  Service: Cardiovascular;  Laterality: N/A;   VIDEO BRONCHOSCOPY WITH ENDOBRONCHIAL ULTRASOUND N/A 07/03/2023   Procedure: VIDEO BRONCHOSCOPY WITH ENDOBRONCHIAL ULTRASOUND;  Surgeon: Lorin Glass, MD;  Location: [facility] ENDOSCOPY;  Service: Pulmonary;  Laterality: N/A;     A IV Location/Drains/Wounds Patient Lines/Drains/Airways Status     Active Line/Drains/Airways     Name Placement date Placement time Site Days   Peripheral IV 07/11/23 20 G Left;Posterior  Hand 07/11/23  0400  Hand  less than 1   External Urinary Catheter 07/11/23  0430  --  less than 1            Intake/Output Last 24 hours No intake or output data in the 24 hours ending 07/11/23 1912  Labs/Imaging Results for orders placed or performed during the hospital encounter of 07/10/23 (from the past 48 hours)   CBG monitoring, ED     Status: None   Collection Time: 07/10/23  6:32 PM  Result Value Ref Range   Glucose-Capillary 99 70 - 99 mg/dL    Comment: Glucose reference range applies only to samples taken after fasting for at least 8 hours.  Brain natriuretic peptide     Status: Abnormal   Collection Time: 07/10/23  8:31 PM  Result Value Ref Range   B Natriuretic Peptide 2,597.3 (H) 0.0 - 100.0 pg/mL    Comment: Performed at [facility], [address], [place], [place] [address]  Troponin I (High Sensitivity)     Status: Abnormal   Collection Time: 07/10/23  8:31 PM  Result Value Ref Range   Troponin I (High Sensitivity) 41 (H) <18 ng/L    Comment: (NOTE) Elevated high sensitivity troponin I (hsTnI) values and significant  changes across serial measurements may suggest ACS but many other  chronic and acute conditions are known to elevate hsTnI results.  Refer to the "Links" section for chest pain algorithms and additional  guidance. Performed at [facility], [address] [place], [place] [address]   Basic metabolic panel     Status: Abnormal   Collection Time: 07/10/23  8:32 PM  Result Value Ref Range   Sodium 137 135 - 145 mmol/L   Potassium 4.4 3.5 - 5.1 mmol/L   Chloride 105 98 - 111 mmol/L   CO2 23 22 - 32 mmol/L   Glucose, Bld 115 (H) 70 - 99 mg/dL    Comment: Glucose reference range applies only to samples taken after fasting for at least 8 hours.   BUN 27 (H) 6 - 20 mg/dL   Creatinine, Ser 0.98 (H) 0.61 - 1.24 mg/dL   Calcium 9.0 8.9 - 11.9 mg/dL   GFR, Estimated 55 (L) >60 mL/min    Comment: (NOTE) Calculated using the CKD-EPI Creatinine Equation (2021)    Anion gap 9 5 - 15    Comment: Performed at [facility], [address], [place], [place] [address]  CBC     Status: Abnormal   Collection Time: 07/10/23  8:32 PM  Result Value Ref Range   WBC 10.0 4.0 - 10.5 K/uL   RBC 3.52 (L) 4.22 - 5.81 MIL/uL    Hemoglobin 10.3 (L) 13.0 - 17.0 g/dL   HCT 95.6 (L) 21.3 - 08.6 %   MCV 89.2 80.0 - 100.0 fL   MCH 29.3 26.0 - 34.0 pg   MCHC 32.8 30.0 - 36.0 g/dL   RDW 57.8 (H) 46.9 - 62.9 %   Platelets 344 150 - 400 K/uL   nRBC 0.0 0.0 - 0.2 %    Comment: Performed at [facility], [address], [place], [place] [address]  Urinalysis, Routine w reflex microscopic -Urine, Clean Catch     Status: Abnormal   Collection Time: 07/11/23  3:26 AM  Result Value Ref Range   Color, Urine YELLOW YELLOW   APPearance CLEAR CLEAR   Specific Gravity, Urine 1.023 1.005 - 1.030  pH 6.0 5.0 - 8.0   Glucose, UA NEGATIVE NEGATIVE mg/dL   Hgb urine dipstick NEGATIVE NEGATIVE   Bilirubin Urine NEGATIVE NEGATIVE   Ketones, ur NEGATIVE NEGATIVE mg/dL   Protein, ur 696 (A) NEGATIVE mg/dL   Nitrite NEGATIVE NEGATIVE   Leukocytes,Ua NEGATIVE NEGATIVE   RBC / HPF 0-5 0 - 5 RBC/hpf   WBC, UA 0-5 0 - 5 WBC/hpf   Bacteria, UA NONE SEEN NONE SEEN   Squamous Epithelial / HPF 0-5 0 - 5 /HPF   Hyaline Casts, UA PRESENT     Comment: Performed at [facility], [address] [place], [place] [address]  MRSA Next Gen by PCR, Nasal     Status: None   Collection Time: 07/11/23  3:39 AM   Specimen: Nasal Mucosa; Nasal Swab  Result Value Ref Range   MRSA by PCR Next Gen NOT DETECTED NOT DETECTED    Comment: (NOTE) The GeneXpert MRSA Assay (FDA approved for NASAL specimens only), is one component of a comprehensive MRSA colonization surveillance program. It is not intended to diagnose MRSA infection nor to guide or monitor treatment for MRSA infections. Test performance is not FDA approved in patients less than 64 years old. Performed at [facility], [address], [place], [place] [address]   Resp panel by RT-PCR (RSV, Flu A&B, Covid) Nasal Mucosa     Status: None   Collection Time: 07/11/23  3:46 AM   Specimen: Nasal Mucosa; Nasal Swab  Result Value Ref Range    SARS Coronavirus 2 by RT PCR NEGATIVE NEGATIVE    Comment: (NOTE) SARS-CoV-2 target nucleic acids are NOT DETECTED.  The SARS-CoV-2 RNA is generally detectable in upper respiratory specimens during the acute phase of infection. The lowest concentration of SARS-CoV-2 viral copies this assay can detect is 138 copies/mL. A negative result does not preclude SARS-Cov-2 infection and should not be used as the sole basis for treatment or other patient management decisions. A negative result may occur with  improper specimen collection/handling, submission of specimen other than nasopharyngeal swab, presence of viral mutation(s) within the areas targeted by this assay, and inadequate number of viral copies(<138 copies/mL). A negative result must be combined with clinical observations, patient history, and epidemiological information. The expected result is Negative.  Fact Sheet for Patients:  BloggerCourse.com  Fact Sheet for Healthcare Providers:  SeriousBroker.it  This test is no t yet approved or cleared by the [place] FDA and  has been authorized for detection and/or diagnosis of SARS-CoV-2 by FDA under an Emergency Use Authorization (EUA). This EUA will remain  in effect (meaning this test can be used) for the duration of the COVID-19 declaration under Section 564(b)(1) of the Act, 21 U.S.C.section 360bbb-3(b)(1), unless the authorization is terminated  or revoked sooner.       Influenza A by PCR NEGATIVE NEGATIVE   Influenza B by PCR NEGATIVE NEGATIVE    Comment: (NOTE) The Xpert Xpress SARS-CoV-2/FLU/RSV plus assay is intended as an aid in the diagnosis of influenza from Nasopharyngeal swab specimens and should not be used as a sole basis for treatment. Nasal washings and aspirates are unacceptable for Xpert Xpress SARS-CoV-2/FLU/RSV testing.  Fact Sheet for Patients: BloggerCourse.com  Fact  Sheet for Healthcare Providers: SeriousBroker.it  This test is not yet approved or cleared by the [place] FDA and has been authorized for detection and/or diagnosis of SARS-CoV-2 by FDA under an Emergency Use Authorization (EUA). This EUA will remain in effect (  meaning this test can be used) for the duration of the COVID-19 declaration under Section 564(b)(1) of the Act, 21 U.S.C. section 360bbb-3(b)(1), unless the authorization is terminated or revoked.     Resp Syncytial Virus by PCR NEGATIVE NEGATIVE    Comment: (NOTE) Fact Sheet for Patients: BloggerCourse.com  Fact Sheet for Healthcare Providers: SeriousBroker.it  This test is not yet approved or cleared by the [place] FDA and has been authorized for detection and/or diagnosis of SARS-CoV-2 by FDA under an Emergency Use Authorization (EUA). This EUA will remain in effect (meaning this test can be used) for the duration of the COVID-19 declaration under Section 564(b)(1) of the Act, 21 U.S.C. section 360bbb-3(b)(1), unless the authorization is terminated or revoked.  Performed at [facility], [address], [place], [place] [address]   Respiratory (~20 pathogens) panel by PCR     Status: None   Collection Time: 07/11/23  3:46 AM   Specimen: Nasopharyngeal Swab; Respiratory  Result Value Ref Range   Adenovirus NOT DETECTED NOT DETECTED   Coronavirus 229E NOT DETECTED NOT DETECTED    Comment: (NOTE) The Coronavirus on the Respiratory Panel, DOES NOT test for the novel  Coronavirus (2019 nCoV)    Coronavirus HKU1 NOT DETECTED NOT DETECTED   Coronavirus NL63 NOT DETECTED NOT DETECTED   Coronavirus OC43 NOT DETECTED NOT DETECTED   Metapneumovirus NOT DETECTED NOT DETECTED   Rhinovirus / Enterovirus NOT DETECTED NOT DETECTED   Influenza A NOT DETECTED NOT DETECTED   Influenza B NOT DETECTED NOT DETECTED    Parainfluenza Virus 1 NOT DETECTED NOT DETECTED   Parainfluenza Virus 2 NOT DETECTED NOT DETECTED   Parainfluenza Virus 3 NOT DETECTED NOT DETECTED   Parainfluenza Virus 4 NOT DETECTED NOT DETECTED   Respiratory Syncytial Virus NOT DETECTED NOT DETECTED   Bordetella pertussis NOT DETECTED NOT DETECTED   Bordetella Parapertussis NOT DETECTED NOT DETECTED   Chlamydophila pneumoniae NOT DETECTED NOT DETECTED   Mycoplasma pneumoniae NOT DETECTED NOT DETECTED    Comment: Performed at [facility], [address], [place], [place] [address]  Troponin I (High Sensitivity)     Status: Abnormal   Collection Time: 07/11/23  4:02 AM  Result Value Ref Range   Troponin I (High Sensitivity) 42 (H) <18 ng/L    Comment: (NOTE) Elevated high sensitivity troponin I (hsTnI) values and significant  changes across serial measurements may suggest ACS but many other  chronic and acute conditions are known to elevate hsTnI results.  Refer to the "Links" section for chest pain algorithms and additional  guidance. Performed at [facility], [address] [place], [place] [address]   Rapid urine drug screen (hospital performed)     Status: Abnormal   Collection Time: 07/11/23  4:46 AM  Result Value Ref Range   Opiates NONE DETECTED NONE DETECTED   Cocaine POSITIVE (A) NONE DETECTED   Benzodiazepines NONE DETECTED NONE DETECTED   Amphetamines NONE DETECTED NONE DETECTED   Tetrahydrocannabinol NONE DETECTED NONE DETECTED   Barbiturates NONE DETECTED NONE DETECTED    Comment: (NOTE) DRUG SCREEN FOR MEDICAL PURPOSES ONLY.  IF CONFIRMATION IS NEEDED FOR ANY PURPOSE, NOTIFY LAB WITHIN 5 DAYS.  LOWEST DETECTABLE LIMITS FOR URINE DRUG SCREEN Drug Class                     Cutoff (ng/mL) Amphetamine and metabolites    1000 Barbiturate and metabolites    200 Benzodiazepine  200 Opiates and metabolites        300 Cocaine and metabolites        300 THC                             50 Performed at [facility], [address], [place], [place] [address]   Basic metabolic panel     Status: Abnormal   Collection Time: 07/11/23  5:44 AM  Result Value Ref Range   Sodium 136 135 - 145 mmol/L   Potassium 3.2 (L) 3.5 - 5.1 mmol/L   Chloride 101 98 - 111 mmol/L   CO2 24 22 - 32 mmol/L   Glucose, Bld 151 (H) 70 - 99 mg/dL    Comment: Glucose reference range applies only to samples taken after fasting for at least 8 hours.   BUN 23 (H) 6 - 20 mg/dL   Creatinine, Ser 6.04 (H) 0.61 - 1.24 mg/dL   Calcium 8.8 (L) 8.9 - 10.3 mg/dL   GFR, Estimated 59 (L) >60 mL/min    Comment: (NOTE) Calculated using the CKD-EPI Creatinine Equation (2021)    Anion gap 11 5 - 15    Comment: Performed at [facility], [address], [place], [place] [address]  Magnesium     Status: None   Collection Time: 07/11/23  5:44 AM  Result Value Ref Range   Magnesium 2.0 1.7 - 2.4 mg/dL    Comment: Performed at [facility], [address], [place], [place] [address]  Phosphorus     Status: None   Collection Time: 07/11/23  5:44 AM  Result Value Ref Range   Phosphorus 3.1 2.5 - 4.6 mg/dL    Comment: Performed at [facility], [address] [place], [place] [address]  Sedimentation rate     Status: Abnormal   Collection Time: 07/11/23  5:44 AM  Result Value Ref Range   Sed Rate 40 (H) 0 - 16 mm/hr    Comment: Performed at [facility], [address] [place], [place] [address]  Procalcitonin     Status: None   Collection Time: 07/11/23  5:44 AM  Result Value Ref Range   Procalcitonin <0.10 ng/mL    Comment:        Interpretation: PCT (Procalcitonin) <= 0.5 ng/mL: Systemic infection (sepsis) is not likely. Local bacterial infection is possible. (NOTE)       Sepsis PCT Algorithm           Lower Respiratory Tract                                      Infection PCT Algorithm     ----------------------------     ----------------------------         PCT < 0.25 ng/mL                PCT < 0.10 ng/mL          Strongly encourage             Strongly discourage   discontinuation of antibiotics    initiation of antibiotics    ----------------------------     -----------------------------       PCT 0.25 - 0.50 ng/mL            PCT 0.10 - 0.25 ng/mL  OR       >80% decrease in PCT            Discourage initiation of                                            antibiotics      Encourage discontinuation           of antibiotics    ----------------------------     -----------------------------         PCT >= 0.50 ng/mL              PCT 0.26 - 0.50 ng/mL               AND        <80% decrease in PCT             Encourage initiation of                                             antibiotics       Encourage continuation           of antibiotics    ----------------------------     -----------------------------        PCT >= 0.50 ng/mL                  PCT > 0.50 ng/mL               AND         increase in PCT                  Strongly encourage                                      initiation of antibiotics    Strongly encourage escalation           of antibiotics                                     -----------------------------                                           PCT <= 0.25 ng/mL                                                 OR                                        > 80% decrease in PCT                                      Discontinue / Do not initiate  antibiotics  Performed at [facility], [address] [place], [place] [address]   CBC with Differential/Platelet     Status: Abnormal   Collection Time: 07/11/23  5:44 AM  Result Value Ref Range   WBC 10.2 4.0 - 10.5 K/uL   RBC 3.41 (L) 4.22 - 5.81 MIL/uL   Hemoglobin 9.9 (L) 13.0 - 17.0 g/dL   HCT 62.1 (L) 30.8 - 65.7 %   MCV  91.2 80.0 - 100.0 fL   MCH 29.0 26.0 - 34.0 pg   MCHC 31.8 30.0 - 36.0 g/dL   RDW 84.6 (H) 96.2 - 95.2 %   Platelets 292 150 - 400 K/uL   nRBC 0.0 0.0 - 0.2 %   Neutrophils Relative % 68 %   Neutro Abs 6.9 1.7 - 7.7 K/uL   Lymphocytes Relative 23 %   Lymphs Abs 2.3 0.7 - 4.0 K/uL   Monocytes Relative 4 %   Monocytes Absolute 0.4 0.1 - 1.0 K/uL   Eosinophils Relative 5 %   Eosinophils Absolute 0.5 0.0 - 0.5 K/uL   Basophils Relative 0 %   Basophils Absolute 0.0 0.0 - 0.1 K/uL   Immature Granulocytes 0 %   Abs Immature Granulocytes 0.03 0.00 - 0.07 K/uL    Comment: Performed at [facility], [address], [place], [place] [address]  C-reactive protein     Status: Abnormal   Collection Time: 07/11/23  9:07 AM  Result Value Ref Range   CRP 5.6 (H) <1.0 mg/dL    Comment: Performed at [facility], [address] [place], [place] [address]   CT CHEST WO CONTRAST Result Date: 07/11/2023 CLINICAL DATA:  Focal pneumonia with necrotizing component. Recent Val EXAM: CT CHEST WITHOUT CONTRAST TECHNIQUE: Multidetector CT imaging of the chest was performed following the standard protocol without IV contrast. RADIATION DOSE REDUCTION: This exam was performed according to the departmental dose-optimization program which includes automated exposure control, adjustment of the mA and/or kV according to patient size and/or use of iterative reconstruction technique. COMPARISON:  Chest CT 06/26/2023 FINDINGS: Cardiovascular: Cardiac enlargement. No pericardial effusion. No acute vascular finding without contrast. Mediastinum/Nodes: Thickening of mediastinal lymph nodes similar to prior and likely reactive given the pulmonary findings and history. No interval change Lungs/Pleura: There are areas of peripheral consolidative opacity in the bilateral lungs without progression. History of necrotizing component, with minimal superimposed lucency at the consolidation in the posterior right  and lateral left lower lobes. There is diffuse airway cuffing, interlobular septal thickening, fissure thickening, and trace pleural fluid. Some mosaic attenuation of the upper lungs likely related to small airways disease and the airway thickening. 8 mm nodule in the right upper lobe on 2:20, not significantly changed since 2016 chest CT, consistent with benign process. Upper Abdomen: No acute finding Musculoskeletal: No acute finding IMPRESSION: Similar degree of patchy bilateral pneumonia with subtle cavitary features. CHF. Electronically Signed   By: Tiburcio Pea M.D.   On: 07/11/2023 06:17   DG Chest Port 1 View Result Date: 07/11/2023 CLINICAL DATA:  Weakness and cough EXAM: PORTABLE CHEST 1 VIEW COMPARISON:  Chest x-ray 07/10/2023.  Chest CT 06/26/2023. FINDINGS: Patchy left lower lobe airspace disease has slightly increased. Focal slightly nodular airspace disease in the right lung base has slightly increased. Heart is mildly enlarged. There is no pleural effusion or pneumothorax. No acute fractures are seen. IMPRESSION: Increase in patchy left lower lobe airspace disease and increase in slightly nodular airspace disease in  the right lung base. Findings are concerning for pneumonia. Follow-up chest x-ray recommended in 4-6 weeks to ensure resolution. Electronically Signed   By: Darliss Cheney M.D.   On: 07/11/2023 03:05   DG Chest Port 1 View Result Date: 07/10/2023 CLINICAL DATA:  Chest pain EXAM: PORTABLE CHEST 1 VIEW COMPARISON:  06/26/2023 FINDINGS: Chronic cardiomegaly. Hazy opacity at the right base, airspace opacity adjacent to the pleura by recent chest CT. Improved aeration in the left mid lung. Trace bilateral pleural fluid. No pneumothorax. IMPRESSION: 1. Stable to improved opacities in the lower lungs seen on chest CT 06/26/2023. Recommend continued follow-up to clearing. 2. Chronic cardiomegaly. Electronically Signed   By: Tiburcio Pea M.D.   On: 07/10/2023 07:47    Pending  Labs Unresulted Labs (From admission, onward)     Start     Ordered   07/12/23 0500  CBC  Tomorrow morning,   R        07/11/23 1529   07/12/23 0500  Comprehensive metabolic panel  Tomorrow morning,   R        07/11/23 1529   07/11/23 0500  Aspergillus Ag, BAL/Serum  Once,   R        07/11/23 0419   07/11/23 0415  Histoplasma antigen, urine  Once,   R        07/11/23 0419   07/11/23 0414  Blastomyces Antigen  Once,   R        07/11/23 0419            Vitals/Pain Today's Vitals   07/11/23 1454 07/11/23 1530 07/11/23 1730 07/11/23 1830  BP:  121/78 115/77 (!) 136/91  Pulse:  88 88   Resp:  20 19 (!) 21  Temp: (!) 97.5 F (36.4 C)     TempSrc: Oral     SpO2:  98% 98%   Weight:      Height:      PainSc:        Isolation Precautions Droplet precaution  Medications Medications  enoxaparin (LOVENOX) injection 40 mg (40 mg Subcutaneous Given 07/11/23 1053)  sodium chloride flush (NS) 0.9 % injection 3 mL (3 mLs Intravenous Given 07/11/23 1057)  acetaminophen (TYLENOL) tablet 1,000 mg (1,000 mg Oral Given 07/11/23 0610)  atorvastatin (LIPITOR) tablet 40 mg (40 mg Oral Given 07/11/23 1048)  carvedilol (COREG) tablet 3.125 mg (3.125 mg Oral Given 07/11/23 1048)  clopidogrel (PLAVIX) tablet 75 mg (75 mg Oral Given 07/11/23 1048)  umeclidinium bromide (INCRUSE ELLIPTA) 62.5 MCG/ACT 1 puff (1 puff Inhalation Not Given 07/11/23 0922)  hydrocortisone-pramoxine (ANALPRAM-HC) 2.5-1 % rectal cream (1 Application Rectal Given 07/11/23 1605)  amoxicillin-clavulanate (AUGMENTIN) 875-125 MG per tablet 1 tablet (1 tablet Oral Given 07/11/23 1830)  furosemide (LASIX) injection 40 mg (40 mg Intravenous Given 07/11/23 1444)  nicotine (NICODERM CQ - dosed in mg/24 hr) patch 7 mg (7 mg Transdermal Patch Applied 07/11/23 1054)  nicotine polacrilex (NICORETTE) gum 2 mg (has no administration in time range)  albuterol (PROVENTIL) (2.5 MG/3ML) 0.083% nebulizer solution 2.5 mg (has no  administration in time range)  ceFEPIme (MAXIPIME) 2 g in sodium chloride 0.9 % 100 mL IVPB (0 g Intravenous Stopped 07/11/23 0500)  furosemide (LASIX) injection 40 mg (40 mg Intravenous Given 07/11/23 0406)  vancomycin (VANCOCIN) IVPB 1000 mg/200 mL premix (0 mg Intravenous Stopped 07/11/23 0528)  potassium chloride (KLOR-CON) packet 40 mEq (40 mEq Oral Given 07/11/23 1048)    Mobility walks with person assist/walks with assistive device (cane  at bedside)     Focused Assessments     R Recommendations: See Admitting Provider Note  Report given to:   Additional Notes:

## 2023-07-11 NOTE — Progress Notes (Addendum)
Pharmacy Antibiotic Note  Craig Schmidt is a 52 y.o. male admitted on 07/10/2023 with persistent multifocal pneumonia.  Pharmacy has been consulted for Cefepime dosing.  Plan: Cefepime 2gm IV q12h Monitor renal function and cx data   Height: 5\' 7"  (170.2 cm) Weight: 69 kg (152 lb 1.9 oz) IBW/kg (Calculated) : 66.1  Temp (24hrs), Avg:97.8 F (36.6 C), Min:97.4 F (36.3 C), Max:98.4 F (36.9 C)  Recent Labs  Lab 07/10/23 0617 07/10/23 2032  WBC 11.2* 10.0  CREATININE 1.10 1.54*    Estimated Creatinine Clearance: 53.7 mL/min (A) (by C-G formula based on SCr of 1.54 mg/dL (H)).    Allergies  Allergen Reactions   Shellfish Allergy Anaphylaxis   Peanut (Diagnostic) Itching    Antimicrobials this admission: 12/19 Cefepime >>  12/19 Metronidazole >>  12/19 Vanc x1 in ED  Dose adjustments this admission:  Microbiology results:  12/19 Resp PCR: negative 12/19 MRSA PCR: negative 12/19 Aspergillus Ag: 12/19 Blastomyces Ag: 12/19 Histoplasma Ag:  Thank you for allowing pharmacy to be a part of this patient's care.  Junita Push PharmD 07/11/2023 4:30 AM

## 2023-07-11 NOTE — Progress Notes (Signed)
Subjective: Patient admitted this morning, see detailed H&P by Dr Lazarus Salines a 52 y.o. 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 TTE last on 06/27/23 included above, with progressive worsening LV function, LVEF less than 20%, RV moderately reduced systolic function. Exam with peripheral edema, JVD, BMP 2597, uptrending Trop high-sensitivity troponin 41 -> 42. CT imaging with stable findings of pneumonia although features of CHF. Etiology of continued decline in function suspect in part from demand ischemia from his recent infection and ongoing crack use. Question if some of his exertional symptoms may be related to symptomatic mitral valve stenosis. Dry weight is approximately 61 kg=134 pounds (on 11/27), recent uptrend per weight history  -Status post Lasix 40 mg IV x 1 in the ED, scheduled BID for now  -Daily weights and strict ins and out -GDMT: continue home coreg, not on other GDMT at present, defer changes to cardiology  -For ischemic cardiomyopathy continue Plavix, atorvastatin -Management of substance  use per below   History of a multifocal pneumonia with necrotizing component,  status post recent BAL on 12/11 with negative bacterial culture and AFB/fungal cultures as well as inflammatory evaluation still pending per below.  CT chest demonstrates similar degree of pneumonia compared to prior CT on 12/4.  However there are findings of CHF per above.  -Status post vancomycin, cefepime in the ED.  Considering stable findings of pneumonia transition back to Augmentin 875/125 twice daily; had been planned for at least 4-week course with prior plan for repeat CT imaging and need to have reevaluation by pulmonology to determine if course should be extended (current Rx provided EOT 12/27).   -Re: workup previously:  recent BAL on 12/12 no organisms seen, bacterial culture negative. AFB smear was negative, AFB cultures pending Fungal KOH was negative, fungal cultures pending Additional prior infectious evaluation including Fungitell pending, added Aspergillus antigen, histo urine, blasto urine ESR and CRP pending, procalcitonin is pending Additional inflammatory evaluation including ANCA RF CCP C3-C4, antiscleroderma, aldolase are pending   Acute myocardial injury Denies symptoms of exertional chest pain.  Does have right-sided chest pain suspect related to underlying pneumonia.  High-sensitivity troponin low level elevated and flat at 41 -> 42.  EKG has findings of old ischemic changes, although no acute findings.  Suspect demand ischemia in the setting of heart failure exacerbation, crack use, resolving infection. -Management directed at pneumonia, heart failure, substance use   AKI stage I Background CKD stage II Baseline creatinine approximately 1.1, elevated 1.5 at admission.  Suspected cardiorenal - Diuresis per above   Substance use disorder Current smoking Reports last use of crack 3 days ago.  Continue smoking 1/2  pack/day.  Girlfriend whom he is around also substance user, smoker not a  supportive environment for his abstinence.  Although he sounds motivated to stop using, thus far has had chronic use despite his medical issues. -TOC consult for substance use resources - Nicotine 7 mg patch and gum prn.  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  Possibly in the setting of his necrotizing pneumonia.  Will need to clarify with neurology if needs continued anticoagulation therapy  Suspected COPD: Continue home Incruse Ellipta, albuterol prn     Meredeth Ide [facility]

## 2023-07-12 ENCOUNTER — Other Ambulatory Visit (HOSPITAL_COMMUNITY): Payer: Self-pay

## 2023-07-12 DIAGNOSIS — F1410 Cocaine abuse, uncomplicated: Secondary | ICD-10-CM

## 2023-07-12 DIAGNOSIS — I509 Heart failure, unspecified: Secondary | ICD-10-CM | POA: Diagnosis not present

## 2023-07-12 DIAGNOSIS — J189 Pneumonia, unspecified organism: Secondary | ICD-10-CM | POA: Diagnosis not present

## 2023-07-12 DIAGNOSIS — I5020 Unspecified systolic (congestive) heart failure: Secondary | ICD-10-CM

## 2023-07-12 LAB — CBC
HCT: 30.4 % — ABNORMAL LOW (ref 39.0–52.0)
Hemoglobin: 9.5 g/dL — ABNORMAL LOW (ref 13.0–17.0)
MCH: 29.1 pg (ref 26.0–34.0)
MCHC: 31.3 g/dL (ref 30.0–36.0)
MCV: 93 fL (ref 80.0–100.0)
Platelets: 263 10*3/uL (ref 150–400)
RBC: 3.27 MIL/uL — ABNORMAL LOW (ref 4.22–5.81)
RDW: 17 % — ABNORMAL HIGH (ref 11.5–15.5)
WBC: 7.7 10*3/uL (ref 4.0–10.5)
nRBC: 0 % (ref 0.0–0.2)

## 2023-07-12 LAB — COMPREHENSIVE METABOLIC PANEL
ALT: 8 U/L (ref 0–44)
AST: 21 U/L (ref 15–41)
Albumin: 2.8 g/dL — ABNORMAL LOW (ref 3.5–5.0)
Alkaline Phosphatase: 93 U/L (ref 38–126)
Anion gap: 9 (ref 5–15)
BUN: 27 mg/dL — ABNORMAL HIGH (ref 6–20)
CO2: 23 mmol/L (ref 22–32)
Calcium: 8.7 mg/dL — ABNORMAL LOW (ref 8.9–10.3)
Chloride: 103 mmol/L (ref 98–111)
Creatinine, Ser: 1.19 mg/dL (ref 0.61–1.24)
GFR, Estimated: >60 mL/min (ref >60)
Glucose, Bld: 112 mg/dL — ABNORMAL HIGH (ref 70–99)
Potassium: 3.9 mmol/L (ref 3.5–5.1)
Sodium: 135 mmol/L (ref 135–145)
Total Bilirubin: 0.7 mg/dL (ref <1.2)
Total Protein: 6.7 g/dL (ref 6.5–8.1)

## 2023-07-12 LAB — RAPID URINE DRUG SCREEN, HOSP PERFORMED
Amphetamines: NOT DETECTED
Barbiturates: NOT DETECTED
Benzodiazepines: NOT DETECTED
Cocaine: POSITIVE — AB
Opiates: NOT DETECTED
Tetrahydrocannabinol: NOT DETECTED

## 2023-07-12 MED ORDER — PIPERACILLIN-TAZOBACTAM 3.375 G IVPB 30 MIN
3.3750 g | Freq: Three times a day (TID) | INTRAVENOUS | Status: DC
Start: 2023-07-12 — End: 2023-07-15
  Administered 2023-07-12 – 2023-07-15 (×10): 3.375 g via INTRAVENOUS
  Filled 2023-07-12 (×10): qty 50

## 2023-07-12 MED ORDER — SPIRONOLACTONE 12.5 MG HALF TABLET
12.5000 mg | ORAL_TABLET | Freq: Every day | ORAL | Status: DC
  Administered 2023-07-12 – 2023-07-15 (×4): 12.5 mg via ORAL
  Filled 2023-07-12 (×4): qty 1

## 2023-07-12 NOTE — Plan of Care (Signed)

## 2023-07-12 NOTE — Consult Note (Addendum)
Cardiology Consultation   Patient ID: Craig Schmidt MRN: [id number]; DOB: Feb 13, 1971  Admit date: 07/10/2023 Date of Consult: 07/12/2023  PCP:  Rema Fendt, NP   [facility] Providers Cardiologist:  [name], MD  Advanced Heart Failure:  Marca Ancona, MD       Patient Profile:   Craig Schmidt is a 52 y.o. male with a history of CAD with STEMI in 11/2021 (cath should occluded apical LAD  - treated medically), chronic HFrEF with EF of <20% on Echo on 06/27/2023, mitral stenosis, CVA in 08/2021 s/p TNP, left vestibular schwannoma, polysubstance abuse (cocaine and alcohol), and homelessness who is being seen 07/12/2023 for the evaluation of CHF at the request of Dr. Sharl Ma.  History of Present Illness:   Craig Schmidt is a 52 year old male with the above history who is followed by Dr. Carolan Clines and Dr. Shirlee Latch.  Patient was admitted for a left MCA CVA in 08/2021 and ultimately required tenecteplase.  Echo at that time showed LVEF of 25-30%, mildly reduced RV function with elevated RVSP 50 mmHg, moderate mitral stenosis/mild to moderate mitral regurgitation, and moderate to severe tricuspid regurgitation.  TEE at that time showed LVEF of 25-30%, severely reduced RV function, a stenotic mitral valve (questionable parachute mitral valve) with MVA of 1.45 cm consistent with severe mitral stenosis, and moderate mitral regurgitation.  There is no evidence of LAA thrombus.  Stroke was felt to likely be secondary to cardiomyopathy and cardiomyopathy was felt to be due to substance abuse and uncontrolled hypertension.  He was initially started on Coumadin but then was later switched to Eliquis due to concern for compliance given his social situation.  He was subsequently admitted in 11/2021 with acute inferolateral STEMI after using cocaine 24 hours prior.  Emergent LHC showed 100% apical LAD occlusion that was suspected to be embolic versus plaque rupture with thrombosis.  The EDP  was markedly elevated at 40 mmHg echo during this admission showed LVEF of less than 20%.  He was diuresed with IV Lasix and GDMT was adjusted.  Pete echo in 03/2022 showed LVEF of 30-35% with global hypokinesis and apical akinesis, mildly reduced RV function, rheumatic mitral valve with moderate mitral stenosis and trivial mitral regurgitation.  Repeat R/LHC in 03/2022 for further evaluation of mitral valve showed primarily pulmonary venous hypertension with normal RA pressure minimally elevated PCWP as well as moderate mitral stenosis.  He had several admissions since then for acute CHF in the setting of medication noncompliance.  He was last seen in the CHF clinic in 02/2023 at which time he reported NYHA class II symptoms but reported some weight gain and lower extremity edema.  He admits most of his med occasions the week before and was requesting assistance with his medications.  ReDs was 43%.  He was advised to take an extra dose of Lasix and then return to usual dosing.  Had multiple admissions since his last office visit for persistent multifocal pneumonia.  In fact, this is his sixth admission for this since the beginning of 05/2023. Echo on 06/27/2023 during last admission showed LVEF of <20% with global hypokinesis and grade 2 diastolic dysfunction, moderately reduced RV function, biatrial enlargement (left > right), and moderate mitral stenosis. He underwent BAL on 12/11 with negative bacterial cultures, negative AFB smear, negative fungal KOH. AFB cultures, fungal cultures, and additional infectious work-up pending. He was most recently discharged on 07/04/2023.  He presented back to the ED on 07/10/2023 for  worsening shortness of breath. Upon arrival to the ED, EKG showed sinus tachycardia, rate 106 bpm, with nonspecific T wave changes.. High-sensitivity troponin minimally elevated and flat at 28 >> 29 >> 41. BNP markedly elevated at 2,198 (up from 711 during last admission). Chest x-ray showed  chronic cardiomegaly with stable to improved opacities in the lower lungs. WBC 11.2, Hgb 9.8, Plts 311. Na 133, K 4.0, Glucose 80, BUN 21, Cr 1.10. Albumin 3.1 but otherwise LFTs normal. There was concern for possible CHF compoenent so he was started on IV Lasix. Chest CT on 07/11/2023 showed similar degree of patchy bilateral pneumonia with subtl cavitary features as well as some suggestion of CHF. Cardiology consulted for assistance.  At the time of evaluation, he is sitting on the bedside commode.  He appears comfortable.  He states he has shortness of breath with minimal exertion at baseline but states it is significantly worse since being diagnosed with pneumonia earlier last month.  He states he cannot walk very far at all before getting acutely short of breath and having to stop.  He does describe some orthopnea and PND but he is homeless and often sleeps on the ground.  He has some baseline lower extremity edema but states it has been worse the last couple days.  He describes some intermittent chest pain both at rest and with exertion that comes and goes on its own.  He states has been going on the last couple weeks.  He denies any palpitations.  He does have some lightheadedness/dizziness primarily with quick position changes.  He has fallen in the past but no falls since recent discharge.  No syncope.  He has a chronic cough.  He he denies any recent abnormal bleeding.  He continues to report tobacco, alcohol, and crack cocaine use.  He last used crack cocaine about a week and a half ago.   Past Medical History:  Diagnosis Date   Alcohol abuse    Asthma    Brain tumor (HCC)    CAD (coronary artery disease)    Chronic HFrEF (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)    Chronic kidney disease, stage 3 (HCC)    Cocaine abuse (HCC)    History of medication noncompliance    Homelessness    MI (myocardial infarction) (HCC)    STEMI (ST elevation myocardial infarction) (HCC)     Stroke [facility])     Past Surgical History:  Procedure Laterality Date   BRONCHIAL NEEDLE ASPIRATION BIOPSY  07/03/2023   Procedure: BRONCHIAL NEEDLE ASPIRATION BIOPSIES;  Surgeon: Lorin Glass, MD;  Location: [facility] ENDOSCOPY;  Service: Pulmonary;;   BRONCHIAL WASHINGS  07/03/2023   Procedure: BRONCHIAL WASHINGS;  Surgeon: Lorin Glass, MD;  Location: [facility] ENDOSCOPY;  Service: Pulmonary;;   BUBBLE STUDY  09/11/2021   Procedure: BUBBLE STUDY;  Surgeon: Pricilla Riffle, MD;  Location: [facility] ENDOSCOPY;  Service: Cardiovascular;;   LEFT HEART CATH AND CORONARY ANGIOGRAPHY N/A 12/19/2021   Procedure: LEFT HEART CATH AND CORONARY ANGIOGRAPHY;  Surgeon: Lyn Records, MD;  Location: [facility] INVASIVE CV LAB;  Service: Cardiovascular;  Laterality: N/A;   RIGHT/LEFT HEART CATH AND CORONARY ANGIOGRAPHY N/A 04/13/2022   Procedure: RIGHT/LEFT HEART CATH AND CORONARY ANGIOGRAPHY;  Surgeon: Laurey Morale, MD;  Location: [facility] INVASIVE CV LAB;  Service: Cardiovascular;  Laterality: N/A;   TEE WITHOUT CARDIOVERSION N/A 09/11/2021   Procedure: TRANSESOPHAGEAL ECHOCARDIOGRAM (TEE);  Surgeon: Pricilla Riffle, MD;  Location: [facility] ENDOSCOPY;  Service: Cardiovascular;  Laterality:  N/A;   VIDEO BRONCHOSCOPY WITH ENDOBRONCHIAL ULTRASOUND N/A 07/03/2023   Procedure: VIDEO BRONCHOSCOPY WITH ENDOBRONCHIAL ULTRASOUND;  Surgeon: Lorin Glass, MD;  Location: [facility] ENDOSCOPY;  Service: Pulmonary;  Laterality: N/A;     Home Medications:  Prior to Admission medications   Medication Sig Start Date End Date Taking? Authorizing Provider  amoxicillin-clavulanate (AUGMENTIN) 875-125 MG tablet Take 1 tablet by mouth every 12 (twelve) hours for 28 days. 06/22/23 07/20/23 Yes Danford, Earl Lites, MD  atorvastatin (LIPITOR) 40 MG tablet Take 1 tablet (40 mg total) by mouth daily. 06/10/23  Yes Marinda Elk, MD  carvedilol (COREG) 3.125 MG tablet Take 1 tablet (3.125 mg total) by mouth 2 (two) times daily. 06/10/23  Yes Marinda Elk,  MD  clopidogrel (PLAVIX) 75 MG tablet Take 1 tablet (75 mg total) by mouth daily. 06/10/23  Yes Marinda Elk, MD  furosemide (LASIX) 40 MG tablet Take 1 tablet (40 mg total) by mouth daily. Patient taking differently: Take 80 mg by mouth daily. 06/22/23 07/22/23 Yes Danford, Earl Lites, MD  umeclidinium bromide (INCRUSE ELLIPTA) 62.5 MCG/ACT AEPB Inhale 1 puff into the lungs daily. 06/23/23 07/23/23 Yes Danford, Earl Lites, MD  oxyCODONE-acetaminophen (PERCOCET) 5-325 MG tablet Take 1 tablet by mouth every 6 (six) hours as needed. 07/10/23   Bethann Berkshire, MD  apixaban (ELIQUIS) 5 MG TABS tablet Take 1 tablet (5 mg total) by mouth 2 (two) times daily. 06/22/23 06/22/23  DanfordEarl Lites, MD    Inpatient Medications: Scheduled Meds:  atorvastatin  40 mg Oral Daily   carvedilol  3.125 mg Oral BID   clopidogrel  75 mg Oral Daily   enoxaparin (LOVENOX) injection  40 mg Subcutaneous Q24H   furosemide  40 mg Intravenous BID   hydrocortisone-pramoxine   Rectal TID   nicotine  7 mg Transdermal Daily   sodium chloride flush  3 mL Intravenous Q12H   umeclidinium bromide  1 puff Inhalation Daily   Continuous Infusions:  piperacillin-tazobactam 3.375 g (07/12/23 1100)   PRN Meds: acetaminophen, albuterol, nicotine polacrilex  Allergies:    Allergies  Allergen Reactions   Shellfish Allergy Anaphylaxis   Peanut (Diagnostic) Itching    Social History:   Social History   Socioeconomic History   Marital status: Significant Other    Spouse name: Not on file   Number of children: 7   Years of education: Not on file   Highest education level: 9th grade  Occupational History   Occupation: unemployed    Comment: experiencing homelessness-living at daily rent motels.  Tobacco Use   Smoking status: Every Day    Current packs/day: 1.00    Average packs/day: 1 pack/day for 41.0 years (41.0 ttl pk-yrs)    Types: Cigarettes    Passive exposure: Current   Smokeless tobacco:  Never  Vaping Use   Vaping status: Never Used  Substance and Sexual Activity   Alcohol use: Yes    Alcohol/week: 63.0 standard drinks of alcohol    Types: 63 Cans of beer per week    Comment: 3-40oz beer/day x10 years. 1-2 40oz beer/day in 20s/30s   Drug use: Yes    Types: "Crack" cocaine, Cocaine    Comment: last use 01/13/22   Sexual activity: Not Currently  Other Topics Concern   Not on file  Social History Narrative   Not on file   Social Drivers of Health   Financial Resource Strain: High Risk (09/07/2022)   Overall Financial Resource Strain (CARDIA)  Difficulty of Paying Living Expenses: Very hard  Food Insecurity: Food Insecurity Present (07/11/2023)   Hunger Vital Sign    Worried About Running Out of Food in the Last Year: Sometimes true    Ran Out of Food in the Last Year: Sometimes true  Transportation Needs: Unmet Transportation Needs (07/11/2023)   PRAPARE - Administrator, Civil Service (Medical): Yes    Lack of Transportation (Non-Medical): Yes  Physical Activity: Not on file  Stress: Stress Concern Present (10/16/2022)   [employer] of Occupational Health - Occupational Stress Questionnaire    Feeling of Stress : To some extent  Social Connections: Not on file  Intimate Partner Violence: Not At Risk (07/11/2023)   Humiliation, Afraid, Rape, and Kick questionnaire    Fear of Current or Ex-Partner: No    Emotionally Abused: No    Physically Abused: No    Sexually Abused: No    Family History:   Family History  Problem Relation Age of Onset   Diabetes Mother      ROS:  Please see the history of present illness.   Physical Exam/Data:   Vitals:   07/12/23 0412 07/12/23 0500 07/12/23 0830 07/12/23 1334  BP: 121/76   117/86  Pulse: 84   86  Resp:    18  Temp: 97.8 F (36.6 C)   98.4 F (36.9 C)  TempSrc: Oral   Oral  SpO2: 99%  97% 100%  Weight:  65.8 kg    Height:        Intake/Output Summary (Last 24 hours) at 07/12/2023  1603 Last data filed at 07/12/2023 0900 Gross per 24 hour  Intake 240 ml  Output 900 ml  Net -660 ml      07/12/2023    5:00 AM 07/10/2023    6:13 PM 07/04/2023    4:18 AM  Last 3 Weights  Weight (lbs) 145 lb 1 oz 152 lb 1.9 oz 153 lb  Weight (kg) 65.8 kg 69 kg 69.4 kg     Body mass index is 22.72 kg/m.  General: 52 y.o. thin African-American male resting comfortably in no acute distress. HEENT: Normocephalic and atraumatic. Sclera clear.  Neck: Supple. No JVD noted with patient sitting completely upright. Heart: RRR with occasional ectopy.  No murmurs, gallops, or rubs.  Lungs: No increased work of breathing.  Decreased breath sounds in bilateral bases but no wheezes, rhonchi, or rales. Abdomen: Soft, non-distended, and non-tender to palpation. Extremities: No to trace lower extremity edema bilaterally.  Skin: Warm and dry. Neuro: Alert and oriented x3. No focal deficits. Psych: Normal affect. Responds appropriately.  EKG:  The EKG was personally reviewed and demonstrates:  Sinus tachycardia, rate 106 bpm, with nonspecific T wave changes. Telemetry:  Telemetry was personally reviewed and demonstrates:  Sinus rhythm with rates in the 80s to 90s. Frequent PVCs/ ventricular couplets noted. A couple of of short runs of NSVT also noted (longest run 4 beats).  Relevant CV Studies:   Echocardiogram  06/27/2023: Impressions: 1. Left ventricular ejection fraction, by estimation, is <20%. The left  ventricle has severely decreased function. The left ventricle demonstrates  global hypokinesis. The left ventricular internal cavity size was mildly  to moderately dilated. Left  ventricular diastolic parameters are consistent with Grade II diastolic  dysfunction (pseudonormalization).   2. Right ventricular systolic function is moderately reduced. The right  ventricular size is mildly enlarged.   3. Left atrial size was severely dilated.   4. Right atrial size  was mildly dilated.   5.  The mitral valve is grossly normal. There is mild thickening of the  mitral valve with restricted excursion of valve leaflets. Moderate mitral  stenosis.   6. The aortic valve is normal in structure. Aortic valve regurgitation is  trivial.   7. No overt signs of endocarditis.   8. The inferior vena cava is normal in size with greater than 50%  respiratory variability, suggesting right atrial pressure of 3 mmHg.   Laboratory Data:  High Sensitivity Troponin:   Recent Labs  Lab 06/26/23 2303 07/10/23 0617 07/10/23 0930 07/10/23 2031 07/11/23 0402  TROPONINIHS 21* 28* 29* 41* 42*     Chemistry Recent Labs  Lab 07/10/23 2032 07/11/23 0544 07/12/23 0524  NA 137 136 135  K 4.4 3.2* 3.9  CL 105 101 103  CO2 23 24 23   GLUCOSE 115* 151* 112*  BUN 27* 23* 27*  CREATININE 1.54* 1.45* 1.19  CALCIUM 9.0 8.8* 8.7*  MG  --  2.0  --   GFRNONAA 55* 59* >60  ANIONGAP 9 11 9     Recent Labs  Lab 07/10/23 0617 07/12/23 0524  PROT 7.2 6.7  ALBUMIN 3.1* 2.8*  AST 33 21  ALT 11 8  ALKPHOS 114 93  BILITOT 0.7 0.7   Lipids No results for input(s): "CHOL", "TRIG", "HDL", "LABVLDL", "LDLCALC", "CHOLHDL" in the last 168 hours.  Hematology Recent Labs  Lab 07/10/23 2032 07/11/23 0544 07/12/23 0524  WBC 10.0 10.2 7.7  RBC 3.52* 3.41* 3.27*  HGB 10.3* 9.9* 9.5*  HCT 31.4* 31.1* 30.4*  MCV 89.2 91.2 93.0  MCH 29.3 29.0 29.1  MCHC 32.8 31.8 31.3  RDW 16.8* 17.0* 17.0*  PLT 344 292 263   Thyroid No results for input(s): "TSH", "FREET4" in the last 168 hours.  BNP Recent Labs  Lab 07/10/23 0617 07/10/23 2031  BNP 2,198.2* 2,597.3*    DDimer No results for input(s): "DDIMER" in the last 168 hours.   Radiology/Studies:  CT CHEST WO CONTRAST Result Date: 07/11/2023 CLINICAL DATA:  Focal pneumonia with necrotizing component. Recent Val EXAM: CT CHEST WITHOUT CONTRAST TECHNIQUE: Multidetector CT imaging of the chest was performed following the standard protocol without IV  contrast. RADIATION DOSE REDUCTION: This exam was performed according to the departmental dose-optimization program which includes automated exposure control, adjustment of the mA and/or kV according to patient size and/or use of iterative reconstruction technique. COMPARISON:  Chest CT 06/26/2023 FINDINGS: Cardiovascular: Cardiac enlargement. No pericardial effusion. No acute vascular finding without contrast. Mediastinum/Nodes: Thickening of mediastinal lymph nodes similar to prior and likely reactive given the pulmonary findings and history. No interval change Lungs/Pleura: There are areas of peripheral consolidative opacity in the bilateral lungs without progression. History of necrotizing component, with minimal superimposed lucency at the consolidation in the posterior right and lateral left lower lobes. There is diffuse airway cuffing, interlobular septal thickening, fissure thickening, and trace pleural fluid. Some mosaic attenuation of the upper lungs likely related to small airways disease and the airway thickening. 8 mm nodule in the right upper lobe on 2:20, not significantly changed since 2016 chest CT, consistent with benign process. Upper Abdomen: No acute finding Musculoskeletal: No acute finding IMPRESSION: Similar degree of patchy bilateral pneumonia with subtle cavitary features. CHF. Electronically Signed   By: Tiburcio Pea M.D.   On: 07/11/2023 06:17   DG Chest Port 1 View Result Date: 07/11/2023 CLINICAL DATA:  Weakness and cough EXAM: PORTABLE CHEST 1 VIEW COMPARISON:  Chest x-ray  07/10/2023.  Chest CT 06/26/2023. FINDINGS: Patchy left lower lobe airspace disease has slightly increased. Focal slightly nodular airspace disease in the right lung base has slightly increased. Heart is mildly enlarged. There is no pleural effusion or pneumothorax. No acute fractures are seen. IMPRESSION: Increase in patchy left lower lobe airspace disease and increase in slightly nodular airspace disease in  the right lung base. Findings are concerning for pneumonia. Follow-up chest x-ray recommended in 4-6 weeks to ensure resolution. Electronically Signed   By: Darliss Cheney M.D.   On: 07/11/2023 03:05   DG Chest Port 1 View Result Date: 07/10/2023 CLINICAL DATA:  Chest pain EXAM: PORTABLE CHEST 1 VIEW COMPARISON:  06/26/2023 FINDINGS: Chronic cardiomegaly. Hazy opacity at the right base, airspace opacity adjacent to the pleura by recent chest CT. Improved aeration in the left mid lung. Trace bilateral pleural fluid. No pneumothorax. IMPRESSION: 1. Stable to improved opacities in the lower lungs seen on chest CT 06/26/2023. Recommend continued follow-up to clearing. 2. Chronic cardiomegaly. Electronically Signed   By: Tiburcio Pea M.D.   On: 07/10/2023 07:47     Assessment and Plan:   Acute on Chronic HFrEF Biventricular Failure Patient initially diagnosed with CHF in 08/2021 in setting of acute stroke. EF 25-30% at that time. Cardiomyopathy felt to be secondary to polysubstance abuse +/- uncontrolled hypertension.He has had multiple admission for CHF in the past often due to medication compliance. EF has remained severe reduced. Most recent Echo on 06/27/2023 showed LVEF of <20% with global hypokinesis and grade 2 diastolic dysfunction, moderately reduced RV function, biatrial enlargement (left > right), and moderate mitral stenosis. He has had multiple admission over the last 1.5 months for persistent multifocal pneumonia. He now presents with worsening dyspnea on exertion and lower extremity edema and there is concern for CHF involvement. BNP markedly elevated at 2,198 (up from 711 during last admission). Chest x-ray showed chronic cardiomegaly with stable to improved opacities in the lower lungs. CT showed similar degree of patchy bilateral pneumonia with subtl cavitary features as well as some suggestion of CHF. He was started on IV Lasix. - Does not appear significantly volume overloaded on exam but  renal function is improving with diuresis. Patient states it is hard to tell if this is helping his breathing.  - Continue IV Lasix 40mg  twice daily.  - Continue Coreg 3.125mg  twice daily.  - Will start Spironolactone 12.5mg  daily. - Previously on Jardiance. Can try to restart this prior to discharge. - If he remains off cocaine and EF stays low, would be an ICD candidate.  CAD History of STEMI in 11/2021. LHC showed occluded apical CAD - felt to be embolic vs plaque rupture in setting of cocaine abuse. Treated medically. He does described some occasional intermittent chest pain both a rest and with exertion over the last couple of week. High-sensitivity troponin minimally elevated and flat at 28 >> 29 >> 41 >> 42. EKG showed no acute ischemic changes.  - Suspect chest pain may be due to ongoing cocaine use, underlying pneumonia, or volume overload.  - Continue Plavix 75mg  daily and Lipitor 40mg  daily. - Would not pursue any additional ischemic evaluation at this time.   Moderate Mitral Stenosis Initially diagnosed in 08/2021. Also noted on last Echo on 06/27/2023.  - Do not think he would be a candidate for valve surgery with ongoing drug use. Discussed importance of complete drug cessation.  Hypertension BP well controlled.  - Continue medications for CHF as above.  CKD  Stage III Creatinine 1.54 on admission. Baseline Creatinine around 1.2 to 1.5. - Improving with diuresis. Creatinine 1.19 today.  Otherwise, per primary team: - Persistent multifocal pneumonia with necrotizing  - History of CVA - History of Vestibular schwannoma - Substance use disorder (ongoing tobacco, alcohol, and cocaine use),    Risk Assessment/Risk Scores:    New York Heart Association (NYHA) Functional Class NYHA Class IV    For questions or updates, please contact [facility] Please consult [email or web address] for contact info under    Signed, Corrin Parker, [name]  07/12/2023 4:03 PM    ADDENDUM: Initially was planning on adding low dose Spironolactone. Upon further review of his chart, it looks like he has had some occasional hyperkalemia in the past. Although he has also had hypokalemia. Potassium 3.2 >> 3.9 this admission. Will review with MD prior to starting.  Corrin Parker, [name] 07/12/2023 4:06 PM Pager: [phone]    Attending Note:   The patient was seen and examined.  Agree with assessment and plan as noted above.  Changes made to the above note as needed.  Patient seen and independently examined with Marjie Skiff, PA .   We discussed all aspects of the encounter. I agree with the assessment and plan as stated above.    Acute Chronic HFrEF with Biventricular failure :     He has a known severely reduced left ventricular systolic function with EF of less than 20%.  He continues to use cocaine.  He is homeless and has limited availability to transportation, quality food.  His social situation certainly is having an adverse effect on his medical problems.  Will try to make slow and steady progress.  Will continue diuresis.  I think adding spironolactone would be beneficial.  Continue  IV Lasix .  Continue carvedilol.  He has been on Jardiance in the past although on not sure that he can afford that.  Unless his social situation changes, I am not sure that we are going to be able to make much of a difference longterm . I have encouraged him to get back to see Dr. Shirlee Latch in the advanced heart failure clinic.  2.  Moderate mitral stenosis: Will continue to follow.  3.  Coronary artery disease: He has an occluded distal LAD with an apical akinesis/dyskinesis.  This occurred in the setting of cocaine use and is either due to embolus or plaque rupture. His troponins this admission are relatively flat and are not consistent with like an acute coronary syndrome.      I have spent a total of 40 minutes with patient reviewing hospital  notes , telemetry,  EKGs, labs and examining patient as well as establishing an assessment and plan that was discussed with the patient.  > 50% of time was spent in direct patient care.     Vesta Mixer, Montez Hageman., MD, [facility] 07/12/2023, 4:29 PM [address],  [address] Office [phone] Pager [phone]

## 2023-07-12 NOTE — Progress Notes (Signed)
[facility]  PROGRESS NOTE  Craig Schmidt:[id number] DOB: April 25, 1971 DOA: 07/10/2023 PCP: Rema Fendt, NP   Brief HPI:   52 y.o. 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, CKD stage II, homelessness, who presents with progressive dyspnea and cough, worsening lower extremity swelling     Assessment/Plan:   Biventricular failure, combined systolic and diastolic, with acute exacerbation  Moderate mitral valve stenosis -TTE on 06/27/2023 showed progressively worsening LV function, LVEF less than 20%, -BNP 2597 -Started on Lasix 40 mg IV twice daily; with good diuresis -Continue home Coreg, not on GDMT at present -Will consult cardiology for further recommendations  History of a multifocal pneumonia with necrotizing component -status post recent BAL on 12/11 with negative bacterial culture and AFB/fungal cultures as well as inflammatory evaluation still pending per below.  - CT chest demonstrates similar degree of pneumonia compared to prior CT on 12/4.  However there are findings of CHF per above.  -Status post vancomycin, cefepime in the ED.  Considering stable findings of pneumonia transition back to Augmentin 875/125 twice daily; had been planned for at least 4-week course with prior plan for repeat CT imaging and need to have reevaluation by pulmonology to determine if course should be extended (current Rx provided EOT 12/27).  -Re: workup previously:  recent BAL on 12/12 no organisms seen, bacterial culture negative. AFB smear was negative, AFB cultures  pending Fungal KOH was negative, fungal cultures pending Additional prior infectious evaluation including Fungitell pending, added Aspergillus antigen, histo urine, blasto urine ESR and CRP pending, procalcitonin is pending Additional inflammatory evaluation including ANCA RF CCP C3-C4, antiscleroderma, aldolase are pending -Discussed with pulmonology, Dr. Vassie Loll he recommends to switching to IV Zosyn while in the hospital   Acute myocardial injury -No chest pain -Troponin 41, 42 -EKG finding of old ischemic changes -Likely due to demand ischemia from heart failure exacerbation -Has ongoing cocaine abuse -Will check urine drug screen    AKI stage I Background CKD stage II Baseline creatinine approximately 1.1, elevated 1.5 at admission.  Suspected cardiorenal - Diuresis per above   Substance use disorder Current smoking Reports last use of crack 1 days ago.  Continue smoking 1/2 pack/day.  Girlfriend whom he is around also substance user, smoker not a supportive environment for his abstinence.  Although he sounds motivated to stop using, thus far has had chronic use despite his medical issues. -TOC consult for substance use resources - Nicotine 7 mg patch and gum prn.  Provide prescription for NRT on discharge    History of stroke suspected cardioembolic: Note that his apixaban was discontinued during hospitalization 11/'24 although cannot locate documentation to explain.  Possibly in the setting of his necrotizing pneumonia.  Will check with neurology  if needs continued anticoagulation therapy   Suspected COPD: Continue home Incruse Ellipta, albuterol prn    Medications     amoxicillin-clavulanate  1 tablet Oral Q12H   atorvastatin  40 mg Oral Daily   carvedilol  3.125 mg Oral BID   clopidogrel  75 mg Oral Daily   enoxaparin (LOVENOX) injection  40 mg Subcutaneous Q24H   furosemide  40 mg Intravenous BID   hydrocortisone-pramoxine   Rectal TID   nicotine  7 mg Transdermal  Daily   sodium chloride flush  3 mL Intravenous Q12H   umeclidinium bromide  1 puff Inhalation Daily     Data Reviewed:   CBG:  Recent Labs  Lab 07/10/23 1832  GLUCAP 99    SpO2: 99 %    Vitals:   07/11/23 2020 07/12/23 0021 07/12/23 0412 07/12/23 0500  BP: 119/84 120/84 121/76   Pulse: 90 82 84   Resp: 20 19    Temp: 97.7 F (36.5 C) 98 F (36.7 C) 97.8 F (36.6 C)   TempSrc:   Oral   SpO2: 100% 100% 99%   Weight:    65.8 kg  Height:          Data Reviewed:  Basic Metabolic Panel: Recent Labs  Lab 07/10/23 0617 07/10/23 2032 07/11/23 0544 07/12/23 0524  NA 133* 137 136 135  K 4.0 4.4 3.2* 3.9  CL 105 105 101 103  CO2 18* 23 24 23   GLUCOSE 80 115* 151* 112*  BUN 21* 27* 23* 27*  CREATININE 1.10 1.54* 1.45* 1.19  CALCIUM 8.8* 9.0 8.8* 8.7*  MG  --   --  2.0  --   PHOS  --   --  3.1  --     CBC: Recent Labs  Lab 07/10/23 0617 07/10/23 2032 07/11/23 0544 07/12/23 0524  WBC 11.2* 10.0 10.2 7.7  NEUTROABS 7.8*  --  6.9  --   HGB 9.8* 10.3* 9.9* 9.5*  HCT 31.0* 31.4* 31.1* 30.4*  MCV 91.4 89.2 91.2 93.0  PLT 311 344 292 263    LFT Recent Labs  Lab 07/10/23 0617 07/12/23 0524  AST 33 21  ALT 11 8  ALKPHOS 114 93  BILITOT 0.7 0.7  PROT 7.2 6.7  ALBUMIN 3.1* 2.8*     Antibiotics: Anti-infectives (From admission, onward)    Start     Dose/Rate Route Frequency Ordered Stop   07/11/23 1800  amoxicillin-clavulanate (AUGMENTIN) 875-125 MG per tablet 1 tablet        1 tablet Oral Every 12 hours 07/11/23 0656     07/11/23 1600  ceFEPIme (MAXIPIME) 2 g in sodium chloride 0.9 % 100 mL IVPB  Status:  Discontinued        2 g 200 mL/hr over 30 Minutes Intravenous Every 12 hours 07/11/23 0458 07/11/23 0656   07/11/23 1000  metroNIDAZOLE (FLAGYL) tablet 500 mg  Status:  Discontinued        500 mg Oral Every 12 hours 07/11/23 0421 07/11/23 0656   07/11/23 0345  ceFEPIme (MAXIPIME) 2 g in sodium chloride 0.9 % 100 mL IVPB        2 g 200 mL/hr  over 30 Minutes Intravenous  Once 07/11/23 0331 07/11/23 0500   07/11/23 0345  vancomycin (VANCOCIN) IVPB 1000 mg/200 mL premix        1,000 mg 200 mL/hr over 60 Minutes Intravenous  Once 07/11/23 0338 07/11/23 0528        DVT prophylaxis: Lovenox  Code Status: Full code  Family Communication: Discussed with family member at bedside   CONSULTS cardiology   Subjective   Denies shortness of breath   Objective    Physical Examination:   General-appears in no acute distress Heart-S1-S2, regular, no murmur auscultated Lungs-decreased breath sounds at lung bases Abdomen-soft, nontender, no organomegaly Extremities-no edema in the lower extremities Neuro-alert, oriented x3, no focal deficit  noted  Status is: Inpatient:             Meredeth Ide   [facility] If 7PM-7AM, please contact night-coverage at [email or web address], Office  [phone]   07/12/2023, 7:36 AM  LOS: 1 day

## 2023-07-12 NOTE — Progress Notes (Signed)
   07/12/23 1306  TOC Brief Assessment  Insurance and Status Reviewed  Patient has primary care physician Yes  Home environment has been reviewed Houseless  Prior level of function: Independent  Prior/Current Home Services No current home services  Social Drivers of Health Review SDOH reviewed interventions complete  Readmission risk has been reviewed Yes  Transition of care needs transition of care needs identified, TOC will continue to follow    Resources for SA, housing, food, transportation added to AVS. Pt followed by [facility] Managed Care Coordination team for housing and further SODH concerns. Pt would benefit from meds to beds at discharge.

## 2023-07-12 NOTE — Progress Notes (Signed)
Pharmacy Note  I met with patient today and informed him of two services 1) HTN initiative with ambulatory care pharmacist follow up after discharge 2) Meds to [facility] discharge prescriptions at discharge.    Patient has expressed interest and would like to participate in both services.   Staff message sent to Viann Fish and Nilda Simmer for the ambulatory care pharmacist follow up after discharge service.      Thank you for allowing pharmacy to be a part of this patient's care.  Selinda Eon, PharmD, BCPS Clinical Pharmacist [place] 07/12/2023 3:42 PM

## 2023-07-13 ENCOUNTER — Inpatient Hospital Stay (HOSPITAL_COMMUNITY): Payer: 59

## 2023-07-13 DIAGNOSIS — I2510 Atherosclerotic heart disease of native coronary artery without angina pectoris: Secondary | ICD-10-CM

## 2023-07-13 DIAGNOSIS — I5043 Acute on chronic combined systolic (congestive) and diastolic (congestive) heart failure: Secondary | ICD-10-CM

## 2023-07-13 LAB — BASIC METABOLIC PANEL
Anion gap: 11 (ref 5–15)
BUN: 27 mg/dL — ABNORMAL HIGH (ref 6–20)
CO2: 26 mmol/L (ref 22–32)
Calcium: 8.9 mg/dL (ref 8.9–10.3)
Chloride: 100 mmol/L (ref 98–111)
Creatinine, Ser: 1.5 mg/dL — ABNORMAL HIGH (ref 0.61–1.24)
GFR, Estimated: 56 mL/min — ABNORMAL LOW (ref >60)
Glucose, Bld: 100 mg/dL — ABNORMAL HIGH (ref 70–99)
Potassium: 4 mmol/L (ref 3.5–5.1)
Sodium: 137 mmol/L (ref 135–145)

## 2023-07-13 LAB — MAGNESIUM: Magnesium: 2.1 mg/dL (ref 1.7–2.4)

## 2023-07-13 MED ORDER — FUROSEMIDE 40 MG PO TABS
40.0000 mg | ORAL_TABLET | Freq: Every day | ORAL | Status: DC
  Administered 2023-07-13 – 2023-07-15 (×3): 40 mg via ORAL
  Filled 2023-07-13 (×3): qty 1

## 2023-07-13 MED ORDER — ASPIRIN 81 MG PO TBEC
81.0000 mg | DELAYED_RELEASE_TABLET | Freq: Every day | ORAL | Status: DC
  Administered 2023-07-14 – 2023-07-15 (×2): 81 mg via ORAL
  Filled 2023-07-13 (×2): qty 1

## 2023-07-13 MED ORDER — APIXABAN 5 MG PO TABS
5.0000 mg | ORAL_TABLET | Freq: Two times a day (BID) | ORAL | Status: DC
  Administered 2023-07-14 – 2023-07-15 (×3): 5 mg via ORAL
  Filled 2023-07-13 (×3): qty 1

## 2023-07-13 NOTE — Progress Notes (Signed)
The [facility]tech [phone]) has been contacted x2, voicemail message left for return call.

## 2023-07-13 NOTE — Progress Notes (Signed)
[facility]  PROGRESS NOTE  Craig Schmidt ZOX:[id number] DOB: 12-Mar-1971 DOA: 07/10/2023 PCP: Rema Fendt, NP   Brief HPI:   52 y.o. 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 40 mg daily -Continue Coreg. -Started on Aldactone 12.5 mg p.o. daily  History of a multifocal pneumonia with necrotizing component -status post recent BAL on 12/11 with negative bacterial culture and AFB/fungal cultures as well as inflammatory evaluation still pending per below.  - CT chest demonstrates similar degree of pneumonia compared to prior CT on 12/4.  However there are findings of CHF per above.  -Status post vancomycin, cefepime in the ED.  Considering stable findings of pneumonia transition back to Augmentin 875/125 twice daily; had been planned for at least 4-week course with prior plan for repeat CT imaging and need to have reevaluation by pulmonology to determine if course should be extended (current Rx provided EOT 12/27).  -Re: workup previously:  recent BAL on 12/12 no  organisms seen, bacterial culture negative. AFB smear was negative, AFB cultures pending Fungal KOH was negative, fungal cultures pending Additional prior infectious evaluation including Fungitell pending, added Aspergillus antigen, histo urine, blasto urine ESR and CRP pending, procalcitonin is pending Additional inflammatory evaluation including ANCA RF CCP C3-C4, antiscleroderma, aldolase are pending -Discussed with pulmonology, Dr. Vassie Loll he recommends to switching to IV Zosyn while in the hospital   Acute myocardial injury -No chest pain -Troponin 41, 42 -EKG finding of old ischemic changes -Likely due to demand ischemia from heart failure exacerbation -Has ongoing cocaine abuse -Urine tox positive for cocaine    AKI stage I Background CKD stage II Baseline creatinine approximately 1.1, elevated 1.5 at admission.  Suspected cardiorenal - Diuresis per above   Substance use disorder Current smoking Reports last use of crack 1 days ago.  Continue smoking 1/2 pack/day.  Girlfriend whom he is around also substance user, smoker not a supportive environment for his abstinence.  Although he sounds motivated to stop using, thus far has had chronic use despite his medical issues. -TOC consult for substance use resources - Nicotine 7 mg patch and gum prn.  Provide prescription for NRT on discharge    History of stroke suspected cardioembolic: Note that his apixaban was discontinued during hospitalization 11/'24 although cannot locate documentation to explain.  Possibly in the setting of his necrotizing pneumonia.  Neurology recommends that patient should be on Eliquis if okay with cardiology as patient is also on Plavix.Jeanene Erb and discussed with cardiology Dr. Bjorn Pippin, he will review the chart and give recommendations.   Suspected COPD: Continue home Incruse Ellipta, albuterol prn  ?  Shoulder dislocation -Patient states that he has history of shoulder dislocation -  Will check shoulder  x-ray today  Medications     atorvastatin  40 mg Oral Daily   carvedilol  3.125 mg Oral BID   clopidogrel  75 mg Oral Daily   enoxaparin (LOVENOX) injection  40 mg Subcutaneous Q24H   furosemide  40 mg Intravenous BID   hydrocortisone-pramoxine   Rectal TID   nicotine  7 mg Transdermal Daily   sodium chloride flush  3 mL Intravenous Q12H   spironolactone  12.5 mg Oral Daily   umeclidinium bromide  1 puff Inhalation Daily     Data Reviewed:   CBG:  Recent Labs  Lab 07/10/23 1832  GLUCAP 99    SpO2: 99 %    Vitals:   07/12/23 1927 07/13/23 0509 07/13/23 0755 07/13/23 0759  BP: 126/76 116/86    Pulse: 90 89    Resp: 16 16    Temp: 97.9 F (36.6 C) (!) 97.5 F (36.4 C)    TempSrc:      SpO2: 98% 100% 99% 99%  Weight:      Height:          Data Reviewed:  Basic Metabolic Panel: Recent Labs  Lab 07/10/23 0617 07/10/23 2032 07/11/23 0544 07/12/23 0524 07/13/23 0551  NA 133* 137 136 135 137  K 4.0 4.4 3.2* 3.9 4.0  CL 105 105 101 103 100  CO2 18* 23 24 23 26   GLUCOSE 80 115* 151* 112* 100*  BUN 21* 27* 23* 27* 27*  CREATININE 1.10 1.54* 1.45* 1.19 1.50*  CALCIUM 8.8* 9.0 8.8* 8.7* 8.9  MG  --   --  2.0  --  2.1  PHOS  --   --  3.1  --   --     CBC: Recent Labs  Lab 07/10/23 0617 07/10/23 2032 07/11/23 0544 07/12/23 0524  WBC 11.2* 10.0 10.2 7.7  NEUTROABS 7.8*  --  6.9  --   HGB 9.8* 10.3* 9.9* 9.5*  HCT 31.0* 31.4* 31.1* 30.4*  MCV 91.4 89.2 91.2 93.0  PLT 311 344 292 263    LFT Recent Labs  Lab 07/10/23 0617 07/12/23 0524  AST 33 21  ALT 11 8  ALKPHOS 114 93  BILITOT 0.7 0.7  PROT 7.2 6.7  ALBUMIN 3.1* 2.8*     Antibiotics: Anti-infectives (From admission, onward)    Start     Dose/Rate Route Frequency Ordered Stop   07/12/23 0930  piperacillin-tazobactam (ZOSYN) IVPB 3.375 g        3.375 g 12.5 mL/hr over 240 Minutes Intravenous Every 8 hours 07/12/23 0829     07/11/23 1800  amoxicillin-clavulanate (AUGMENTIN) 875-125  MG per tablet 1 tablet  Status:  Discontinued        1 tablet Oral Every 12 hours 07/11/23 0656 07/12/23 0829   07/11/23 1600  ceFEPIme (MAXIPIME) 2 g in sodium chloride 0.9 % 100 mL IVPB  Status:  Discontinued        2 g 200 mL/hr over 30 Minutes Intravenous Every 12 hours 07/11/23 0458 07/11/23 0656   07/11/23 1000  metroNIDAZOLE (FLAGYL) tablet 500 mg  Status:  Discontinued        500 mg Oral Every 12 hours 07/11/23 0421 07/11/23 0656   07/11/23 0345  ceFEPIme (MAXIPIME) 2 g in sodium chloride 0.9 % 100 mL IVPB        2 g 200 mL/hr over 30 Minutes Intravenous  Once 07/11/23 0331 07/11/23 0500   07/11/23 0345  vancomycin (VANCOCIN) IVPB 1000 mg/200  mL premix        1,000 mg 200 mL/hr over 60 Minutes Intravenous  Once 07/11/23 2956 07/11/23 0528        DVT prophylaxis: Lovenox  Code Status: Full code  Family Communication: Discussed with family member at bedside   CONSULTS cardiology   Subjective    Denies shortness of breath.  Objective    Physical Examination:  General-appears in no acute distress Heart-S1-S2, regular, no murmur auscultated Lungs-clear to auscultation bilaterally, no wheezing or crackles auscultated Abdomen-soft, nontender, no organomegaly Extremities-no edema in the lower extremities Neuro-alert, oriented x3, no focal deficit noted   Status is: Inpatient:             Meredeth Ide   [facility] If 7PM-7AM, please contact night-coverage at [email or web address], Office  [phone]   07/13/2023, 8:53 AM  LOS: 2 days

## 2023-07-13 NOTE — Progress Notes (Signed)
Rounding Note    Patient Name: Craig Schmidt Date of Encounter: 07/13/2023  [facility] Cardiologist: [name], MD   Subjective   Net -1.4 L yesterday, -2.2 L on admission.  Worsening renal function (creatinine 1.19 > 1.50).  BP 116/86.  SpO2 99% on room air.  Reports had some dyspnea overnight  Inpatient Medications    Scheduled Meds:  atorvastatin  40 mg Oral Daily   carvedilol  3.125 mg Oral BID   clopidogrel  75 mg Oral Daily   enoxaparin (LOVENOX) injection  40 mg Subcutaneous Q24H   furosemide  40 mg Intravenous BID   hydrocortisone-pramoxine   Rectal TID   nicotine  7 mg Transdermal Daily   sodium chloride flush  3 mL Intravenous Q12H   spironolactone  12.5 mg Oral Daily   umeclidinium bromide  1 puff Inhalation Daily   Continuous Infusions:  piperacillin-tazobactam 3.375 g (07/13/23 0127)   PRN Meds: acetaminophen, albuterol, nicotine polacrilex   Vital Signs    Vitals:   07/12/23 1927 07/13/23 0509 07/13/23 0755 07/13/23 0759  BP: 126/76 116/86    Pulse: 90 89    Resp: 16 16    Temp: 97.9 F (36.6 C) (!) 97.5 F (36.4 C)    TempSrc:      SpO2: 98% 100% 99% 99%  Weight:      Height:        Intake/Output Summary (Last 24 hours) at 07/13/2023 1046 Last data filed at 07/13/2023 0644 Gross per 24 hour  Intake 265.35 ml  Output 1800 ml  Net -1534.65 ml      07/12/2023    5:00 AM 07/10/2023    6:13 PM 07/04/2023    4:18 AM  Last 3 Weights  Weight (lbs) 145 lb 1 oz 152 lb 1.9 oz 153 lb  Weight (kg) 65.8 kg 69 kg 69.4 kg      Telemetry    NSR - Personally Reviewed  ECG    No new ECG- Personally Reviewed  Physical Exam   GEN: No acute distress.   Neck: Mild JVD Cardiac: RRR, no murmurs, rubs, or gallops.  Respiratory: Clear to auscultation bilaterally. GI: Soft, nontender, non-distended  MS: No edema; No deformity. Neuro:  Nonfocal  Psych: Normal affect   Labs    High Sensitivity Troponin:   Recent Labs   Lab 06/26/23 2303 07/10/23 0617 07/10/23 0930 07/10/23 2031 07/11/23 0402  TROPONINIHS 21* 28* 29* 41* 42*     Chemistry Recent Labs  Lab 07/10/23 0617 07/10/23 2032 07/11/23 0544 07/12/23 0524 07/13/23 0551  NA 133*   < > 136 135 137  K 4.0   < > 3.2* 3.9 4.0  CL 105   < > 101 103 100  CO2 18*   < > 24 23 26   GLUCOSE 80   < > 151* 112* 100*  BUN 21*   < > 23* 27* 27*  CREATININE 1.10   < > 1.45* 1.19 1.50*  CALCIUM 8.8*   < > 8.8* 8.7* 8.9  MG  --   --  2.0  --  2.1  PROT 7.2  --   --  6.7  --   ALBUMIN 3.1*  --   --  2.8*  --   AST 33  --   --  21  --   ALT 11  --   --  8  --   ALKPHOS 114  --   --  93  --  BILITOT 0.7  --   --  0.7  --   GFRNONAA >60   < > 59* >60 56*  ANIONGAP 10   < > 11 9 11    < > = values in this interval not displayed.    Lipids No results for input(s): "CHOL", "TRIG", "HDL", "LABVLDL", "LDLCALC", "CHOLHDL" in the last 168 hours.  Hematology Recent Labs  Lab 07/10/23 2032 07/11/23 0544 07/12/23 0524  WBC 10.0 10.2 7.7  RBC 3.52* 3.41* 3.27*  HGB 10.3* 9.9* 9.5*  HCT 31.4* 31.1* 30.4*  MCV 89.2 91.2 93.0  MCH 29.3 29.0 29.1  MCHC 32.8 31.8 31.3  RDW 16.8* 17.0* 17.0*  PLT 344 292 263   Thyroid No results for input(s): "TSH", "FREET4" in the last 168 hours.  BNP Recent Labs  Lab 07/10/23 0617 07/10/23 2031  BNP 2,198.2* 2,597.3*    DDimer No results for input(s): "DDIMER" in the last 168 hours.   Radiology    No results found.  Cardiac Studies     Patient Profile     52 y.o. male  with a history of CAD with STEMI in 11/2021 (cath should occluded apical LAD  - treated medically), chronic HFrEF with EF of <20% on Echo on 06/27/2023, mitral stenosis, CVA in 08/2021 s/p TNP, left vestibular schwannoma, polysubstance abuse (cocaine and alcohol), and homelessness who is being seen for the evaluation of CHF   Assessment & Plan    Acute on Chronic HFrEF Biventricular Failure Patient initially diagnosed with CHF in 08/2021 in  setting of acute stroke. EF 25-30% at that time. Cardiomyopathy felt to be secondary to polysubstance abuse +/- uncontrolled hypertension.He has had multiple admission for CHF in the past often due to medication compliance. EF has remained severe reduced. Most recent Echo on 06/27/2023 showed LVEF of <20% with global hypokinesis and grade 2 diastolic dysfunction, moderately reduced RV function, biatrial enlargement (left > right), and moderate mitral stenosis. He has had multiple admission over the last 1.5 months for persistent multifocal pneumonia. He now presents with worsening dyspnea on exertion and lower extremity edema and there is concern for CHF involvement. BNP markedly elevated at 2,198 (up from 711 during last admission). Chest x-ray showed chronic cardiomegaly with stable to improved opacities in the lower lungs. CT showed similar degree of patchy bilateral pneumonia with subtl cavitary features as well as some suggestion of CHF. He was started on IV Lasix. - Appears euvolemic, had bump in Cr, will transition to PO lasix 40 mg daily - Continue Coreg 3.125mg  twice daily.  - Will start Spironolactone 12.5mg  daily. - Previously on Jardiance. Can try to restart this prior to discharge.   CAD History of STEMI in 11/2021. LHC showed occluded apical CAD - felt to be embolic vs plaque rupture in setting of cocaine abuse. Treated medically. He does described some occasional intermittent chest pain both a rest and with exertion over the last couple of week. High-sensitivity troponin minimally elevated and flat at 28 >> 29 >> 41 >> 42. EKG showed no acute ischemic changes.  - Suspect chest pain may be due to ongoing cocaine use, underlying pneumonia, or volume overload.  - Continue Plavix 75mg  daily and Lipitor 40mg  daily. - Would not pursue any additional ischemic evaluation at this time.    Moderate Mitral Stenosis Initially diagnosed in 08/2021. Also noted on last Echo on 06/27/2023.  - Do not think  he would be a candidate for valve surgery with ongoing drug use.  Needs complete  drug cessation.   Hypertension BP well controlled.  - Continue medications for CHF as above.   CKD Stage III Creatinine 1.54 on admission. Baseline Creatinine around 1.2 to 1.5. - Cr 1.5 today   Otherwise, per primary team: - Persistent multifocal pneumonia with necrotizing  - History of CVA - History of Vestibular schwannoma - Substance use disorder (ongoing tobacco, alcohol, and cocaine use),    For questions or updates, please contact [facility] Please consult [email or web address] for contact info under        Signed, Little Ishikawa, MD  07/13/2023, 10:46 AM

## 2023-07-13 NOTE — Evaluation (Signed)
Physical Therapy Evaluation Patient Details Name: Craig Schmidt MRN: [id number] DOB: 07-14-1971 Today's Date: 07/13/2023  History of Present Illness  52 y.o. male who presents with progressive dyspnea and cough, worsening lower extremity swelling. hx of recent history of a multifocal pneumonia with necrotizing component with admission earlier december 2024 and 11/24-11/30 as well as leaving AMA 11/08. 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  Pt agreeable to bed level activity only at this time d/t fatigue. States he has been walking to bathroom.  Pt is requesting sling for R UE as he was doing on recent admission earlier this month. Reports he "lost it". Pt states he plans to return to the street at d/c. He cannot go to shelters bc he has a male caregiver who helps him with ADLs. Will benefit from PT in acute setting. No f/u recommended at this time.  Pt currently with functional limitations due to the deficits listed below (see PT Problem List). Pt will benefit from acute skilled PT to increase their independence and safety with mobility to allow discharge.           If plan is discharge home, recommend the following:     Can travel by private vehicle        Equipment Recommendations None recommended by PT  Recommendations for Other Services       Functional Status Assessment Patient has had a recent decline in their functional status and/or demonstrates limited ability to make significant improvements in function in a reasonable and predictable amount of time     Precautions / Restrictions Precautions Precautions: Fall Precaution Comments: Baseline R hemi. R shoulder subluxation -requested new sling  from ortho tech (same report last admission)      Mobility  Bed Mobility               General bed mobility comments: pt declined d/t fatigue    Transfers                        Ambulation/Gait                  Stairs            Wheelchair Mobility     Tilt Bed    Modified Rankin (Stroke Patients Only)       Balance               Standing balance comment: pt declined EOB                             Pertinent Vitals/Pain Pain Assessment Pain Assessment: No/denies pain    Home Living Family/patient expects to be discharged to:: Shelter/Homeless   Available Help at Discharge: Friend(s);Available 24 hours/day (male) Type of Home: Homeless             Additional Comments[place] the street[place]    Prior Function Prior Level of Function : Needs assist             Mobility Comments: uses rollator.  Male friend reports that she has to help step his right foot forward at times when he is unable to espically  with curbs (needs help all the time per his male friend) ADLs Comments: Male friend assists him with all ADL tasks at baseline since CVA Feb/March 2023 and has residual R side weakness     Extremity/Trunk Assessment   Upper Extremity Assessment Upper Extremity Assessment: Defer to OT evaluation;Right hand dominant RUE Deficits / Details: Righ hemiparesis at baseline, pt reports his shoulder is "dislocated" appears subluxed    Lower Extremity Assessment Lower Extremity Assessment: LLE deficits/detail RLE Deficits / Details: baseline hemiparesis; grossly 3/5 ankle, knee 2+/5. pt reports strength is "better" since his edema has improved RLE Coordination: decreased gross motor LLE Deficits / Details: grossly WFL       Communication   Communication Communication: No apparent difficulties  Cognition Arousal: Alert Behavior During Therapy: WFL for tasks  assessed/performed Overall Cognitive Status: Within Functional Limits for tasks assessed                                          General Comments      Exercises     Assessment/Plan    PT Assessment Patient needs continued PT services  PT Problem List Decreased strength;Decreased balance;Decreased activity tolerance;Decreased mobility;Decreased knowledge of use of DME;Cardiopulmonary status limiting activity;Decreased range of motion;Decreased coordination       PT Treatment Interventions DME instruction;Gait training;Functional mobility training;Therapeutic activities;Balance training;Patient/family education;Therapeutic exercise    PT Goals (Current goals can be found in the Care Plan section)  Acute Rehab PT Goals Patient Stated Goal: to get better PT Goal Formulation: With patient Time For Goal Achievement: 07/26/23 Potential to Achieve Goals: Fair    Frequency Min 1X/week     Co-evaluation               AM-PAC PT "6 Clicks" Mobility  Outcome Measure Help needed turning from your back to your side while in a flat bed without using bedrails?: A Little Help needed moving from lying on your back to sitting on the side of a flat bed without using bedrails?: A Little Help needed moving to and from a bed to a chair (including a wheelchair)?: A Little Help needed standing up from a chair using your arms (e.g., wheelchair or bedside chair)?: A Little Help needed to walk in hospital room?: A Little Help needed climbing 3-5 steps with a railing? : A Lot 6 Click Score: 17    End of Session   Activity Tolerance: Patient tolerated treatment well Patient left: with call bell/phone within reach;with family/visitor present;in bed;with bed alarm set Nurse Communication: Mobility status PT Visit Diagnosis: Difficulty in walking, not elsewhere classified (R26.2);Muscle weakness (generalized) (M62.81);Unsteadiness on feet (R26.81)    Time: 5284-1324 PT Time  Calculation (min) (ACUTE ONLY): 22 min   Charges:   PT Evaluation $PT Eval Low Complexity: 1 Low   PT General Charges $$ ACUTE PT VISIT: 1 Visit         Norleen Xie, PT  Acute Rehab Dept [facility]) [phone]  07/13/2023   [facility] 07/13/2023, 5:23 PM

## 2023-07-13 NOTE — Progress Notes (Signed)
Pharmacist Heart Failure Core Measure Documentation  Assessment: Craig Schmidt has an EF documented as < 20% on 06/27/23 by echo.  Rationale: Heart failure patients with left ventricular systolic dysfunction (LVSD) and an EF < 40% should be prescribed an angiotensin converting enzyme inhibitor (ACEI) or angiotensin receptor blocker (ARB) at discharge unless a contraindication is documented in the medical record.  This patient is not currently on an ACEI or ARB for HF.  This note is being placed in the record in order to provide documentation that a contraindication to the use of these agents is present for this encounter.  ACE Inhibitor or Angiotensin Receptor Blocker is contraindicated (specify all that apply)  []   ACEI allergy AND ARB allergy []   Angioedema []   Moderate or severe aortic stenosis []   Hyperkalemia []   Hypotension []   Renal artery stenosis [x]   Worsening renal function, preexisting renal disease or dysfunction   Herby Abraham, Pharm.D Use secure chat for questions 07/13/2023 1:29 PM

## 2023-07-13 NOTE — Plan of Care (Signed)

## 2023-07-14 DIAGNOSIS — I5043 Acute on chronic combined systolic (congestive) and diastolic (congestive) heart failure: Secondary | ICD-10-CM | POA: Diagnosis not present

## 2023-07-14 DIAGNOSIS — I2510 Atherosclerotic heart disease of native coronary artery without angina pectoris: Secondary | ICD-10-CM | POA: Diagnosis not present

## 2023-07-14 LAB — CBC
HCT: 32.7 % — ABNORMAL LOW (ref 39.0–52.0)
Hemoglobin: 10.4 g/dL — ABNORMAL LOW (ref 13.0–17.0)
MCH: 29.4 pg (ref 26.0–34.0)
MCHC: 31.8 g/dL (ref 30.0–36.0)
MCV: 92.4 fL (ref 80.0–100.0)
Platelets: 318 10*3/uL (ref 150–400)
RBC: 3.54 MIL/uL — ABNORMAL LOW (ref 4.22–5.81)
RDW: 16.4 % — ABNORMAL HIGH (ref 11.5–15.5)
WBC: 8.5 10*3/uL (ref 4.0–10.5)
nRBC: 0 % (ref 0.0–0.2)

## 2023-07-14 LAB — BASIC METABOLIC PANEL
Anion gap: 8 (ref 5–15)
BUN: 27 mg/dL — ABNORMAL HIGH (ref 6–20)
CO2: 25 mmol/L (ref 22–32)
Calcium: 8.9 mg/dL (ref 8.9–10.3)
Chloride: 104 mmol/L (ref 98–111)
Creatinine, Ser: 1.17 mg/dL (ref 0.61–1.24)
GFR, Estimated: >60 mL/min (ref >60)
Glucose, Bld: 101 mg/dL — ABNORMAL HIGH (ref 70–99)
Potassium: 4.2 mmol/L (ref 3.5–5.1)
Sodium: 137 mmol/L (ref 135–145)

## 2023-07-14 LAB — MAGNESIUM: Magnesium: 2.1 mg/dL (ref 1.7–2.4)

## 2023-07-14 MED ORDER — EMPAGLIFLOZIN 10 MG PO TABS
10.0000 mg | ORAL_TABLET | Freq: Every day | ORAL | Status: DC
  Administered 2023-07-14 – 2023-07-15 (×2): 10 mg via ORAL
  Filled 2023-07-14 (×2): qty 1

## 2023-07-14 MED ORDER — ASPIRIN 81 MG PO TBEC
81.0000 mg | DELAYED_RELEASE_TABLET | Freq: Every day | ORAL | 12 refills | Status: DC
  Filled 2023-07-14 – 2023-07-15 (×2): qty 30, 30d supply, fill #0
  Filled 2023-09-27: qty 30, 30d supply, fill #1

## 2023-07-14 MED ORDER — SPIRONOLACTONE 25 MG PO TABS
12.5000 mg | ORAL_TABLET | Freq: Every day | ORAL | 2 refills | Status: DC
  Filled 2023-07-14 – 2023-07-15 (×2): qty 15, 30d supply, fill #0
  Filled 2023-09-27: qty 15, 30d supply, fill #1

## 2023-07-14 MED ORDER — NICOTINE POLACRILEX 2 MG MT GUM
2.0000 mg | CHEWING_GUM | OROMUCOSAL | 0 refills | Status: AC | PRN
  Filled 2023-07-14: qty 110, 110d supply, fill #0
  Filled 2023-07-15: qty 110, 30d supply, fill #0

## 2023-07-14 MED ORDER — FUROSEMIDE 40 MG PO TABS
40.0000 mg | ORAL_TABLET | Freq: Every day | ORAL | 0 refills | Status: DC
  Filled 2023-07-14 – 2023-07-15 (×2): qty 30, 30d supply, fill #0

## 2023-07-14 MED ORDER — EMPAGLIFLOZIN 10 MG PO TABS
10.0000 mg | ORAL_TABLET | Freq: Every day | ORAL | 2 refills | Status: DC
  Filled 2023-07-14 – 2023-07-15 (×2): qty 30, 30d supply, fill #0
  Filled 2023-09-27: qty 30, 30d supply, fill #1

## 2023-07-14 MED ORDER — APIXABAN 5 MG PO TABS
5.0000 mg | ORAL_TABLET | Freq: Two times a day (BID) | ORAL | 3 refills | Status: DC
  Filled 2023-07-14 – 2023-07-15 (×2): qty 60, 30d supply, fill #0
  Filled 2023-09-27: qty 60, 30d supply, fill #1

## 2023-07-14 NOTE — Progress Notes (Signed)
[facility]  PROGRESS NOTE  Craig Schmidt [name] DOB: 1970/11/30 DOA: 07/10/2023 PCP: Rema Fendt, NP   Brief HPI:   52 y.o. 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 40 mg daily -Continue Coreg 3.125 mg p.o. twice daily -Started on Aldactone 12.5 mg p.o. daily -Continue Jardiance 10 mg daily  Cardiology was consulted for optimization of medications -He has been started on Eliquis and aspirin, Plavix has been discontinued  History of a multifocal pneumonia with necrotizing component -status post recent BAL on 12/11 with negative bacterial culture and AFB/fungal cultures as well as inflammatory evaluation still pending per below.  - CT chest demonstrates similar degree of pneumonia compared to prior CT on 12/4.  However there are findings of CHF per above.  -Status post vancomycin, cefepime in the ED.  Considering stable findings of pneumonia transition back to Augmentin 875/125 twice daily; had been planned for at least 4-week course with prior plan for  repeat CT imaging and need to have reevaluation by pulmonology to determine if course should be extended (current Rx provided EOT 12/27).  -Re: workup previously:  recent BAL on 12/12 no organisms seen, bacterial culture negative. AFB smear was negative, AFB cultures pending Fungal KOH was negative, fungal cultures pending Additional prior infectious evaluation including Fungitell pending, added Aspergillus antigen, histo urine, blasto urine ESR and CRP pending, procalcitonin is pending Additional inflammatory evaluation including ANCA RF CCP C3-C4, antiscleroderma, aldolase are pending -Discussed with pulmonology, Dr. Vassie Loll he recommends to switching to IV Zosyn while in the hospital -Continue Augmentin at discharge, patient has Augmentin at home.   Acute myocardial injury -No chest pain -Troponin 41, 42 -EKG finding of old ischemic changes -Likely due to demand ischemia from heart failure exacerbation -Has ongoing cocaine abuse -Urine tox positive for cocaine    AKI stage I Background CKD stage II Baseline creatinine approximately 1.1, elevated 1.5 at admission.  Suspected cardiorenal - Diuresis per above   Substance use disorder Current smoking Reports last use of crack 1 days ago.  Continue smoking 1/2 pack/day.  Girlfriend whom he is around also substance user, smoker not a supportive environment for his abstinence.  Although he sounds motivated to stop using, thus far has had chronic use despite his medical issues. -TOC consult for substance use resources - Nicotine 7 mg patch and gum prn.  Provide prescription for Nicorette on discharge    History of stroke suspected cardioembolic: Note that his apixaban was discontinued during hospitalization 11/'24 although cannot locate documentation to explain.  Possibly in the setting of his necrotizing pneumonia.  Neurology recommends that patient should be on Eliquis and aspirin if okay with cardiology as patient is also on  Plavix.Jeanene Erb and discussed with cardiology Dr. Bjorn Pippin, recommend to continue with Eliquis and aspirin   Suspected COPD: Continue home Incruse Ellipta, albuterol prn  ?  Shoulder pain -Patient states that he has history of shoulder dislocation -Shoulder x-ray was negative for shoulder dislocation -X-ray showed osteoarthritis  Medications     apixaban  5 mg Oral BID   aspirin EC  81 mg Oral Daily   atorvastatin  40 mg Oral Daily   carvedilol  3.125 mg Oral BID   empagliflozin  10 mg Oral Daily   furosemide  40 mg Oral Daily   hydrocortisone-pramoxine   Rectal TID   nicotine  7 mg Transdermal Daily   sodium chloride flush  3 mL Intravenous Q12H   spironolactone  12.5 mg Oral Daily   umeclidinium bromide  1 puff Inhalation Daily     Data Reviewed:   CBG:  Recent Labs  Lab 07/10/23 1832  GLUCAP 99    SpO2: 100 %    Vitals:   07/14/23 0500 07/14/23 0525 07/14/23 0930 07/14/23 1459  BP:  125/75  124/85  Pulse:  84  87  Resp:  16  18  Temp:  98.1 F (36.7 C)  97.8 F (36.6 C)  TempSrc:    Oral  SpO2:  100% 96% 100%  Weight: 63 kg     Height:          Data Reviewed:  Basic Metabolic Panel: Recent Labs  Lab 07/10/23 2032 07/11/23 0544 07/12/23 0524 07/13/23 0551 07/14/23 0650  NA 137 136 135 137 137  K 4.4 3.2* 3.9 4.0 4.2  CL 105 101 103 100 104  CO2 23 24 23 26 25   GLUCOSE 115* 151* 112* 100* 101*  BUN 27* 23* 27* 27* 27*  CREATININE 1.54* 1.45* 1.19 1.50* 1.17  CALCIUM 9.0 8.8* 8.7* 8.9 8.9  MG  --  2.0  --  2.1 2.1  PHOS  --  3.1  --   --   --     CBC: Recent Labs  Lab 07/10/23 0617 07/10/23 2032 07/11/23 0544 07/12/23 0524 07/14/23 0650  WBC 11.2* 10.0 10.2 7.7 8.5  NEUTROABS 7.8*  --  6.9  --   --   HGB 9.8* 10.3* 9.9* 9.5* 10.4*  HCT 31.0* 31.4* 31.1* 30.4* 32.7*  MCV 91.4 89.2 91.2 93.0 92.4  PLT 311 344 292 263 318    LFT Recent Labs  Lab 07/10/23 0617 07/12/23 0524  AST 33 21  ALT 11 8  ALKPHOS 114 93  BILITOT 0.7 0.7   PROT 7.2 6.7  ALBUMIN 3.1* 2.8*     Antibiotics: Anti-infectives (From admission, onward)    Start     Dose/Rate Route Frequency Ordered Stop   07/12/23 0930  piperacillin-tazobactam (ZOSYN) IVPB 3.375 g        3.375 g 12.5 mL/hr over 240 Minutes Intravenous Every 8 hours 07/12/23 0829     07/11/23 1800  amoxicillin-clavulanate (AUGMENTIN) 875-125 MG per tablet 1 tablet  Status:  Discontinued        1 tablet Oral Every 12 hours 07/11/23 0656 07/12/23 0829   07/11/23 1600  ceFEPIme (MAXIPIME) 2 g in sodium chloride 0.9 % 100 mL IVPB  Status:  Discontinued        2 g 200 mL/hr over 30 Minutes Intravenous Every 12 hours 07/11/23 0458 07/11/23 0656   07/11/23 1000  metroNIDAZOLE (FLAGYL) tablet 500 mg  Status:  Discontinued  500 mg Oral Every 12 hours 07/11/23 0421 07/11/23 0656   07/11/23 0345  ceFEPIme (MAXIPIME) 2 g in sodium chloride 0.9 % 100 mL IVPB        2 g 200 mL/hr over 30 Minutes Intravenous  Once 07/11/23 0331 07/11/23 0500   07/11/23 0345  vancomycin (VANCOCIN) IVPB 1000 mg/200 mL premix        1,000 mg 200 mL/hr over 60 Minutes Intravenous  Once 07/11/23 5784 07/11/23 0528        DVT prophylaxis: Lovenox  Code Status: Full code  Family Communication: Discussed with family member at bedside   CONSULTS cardiology   Subjective   Still complains of coughing.  X-ray of right shoulder only shows osteoarthritis.  Objective    Physical Examination:  General-appears in no acute distress Heart-S1-S2, regular, no murmur auscultated Lungs-clear to auscultation bilaterally, no wheezing or crackles auscultated Abdomen-soft, nontender, no organomegaly Extremities-no edema in the lower extremities Neuro-alert, oriented x3, no focal deficit noted   Status is: Inpatient:             Meredeth Ide   [facility] If 7PM-7AM, please contact night-coverage at [email or web address], Office  [phone]   07/14/2023, 3:33 PM  LOS: 3 days

## 2023-07-14 NOTE — Discharge Instructions (Signed)

## 2023-07-14 NOTE — Plan of Care (Signed)
  Problem: Education: Goal: Knowledge of General Education information will improve Description: Including pain rating scale, medication(s)/side effects and non-pharmacologic comfort measures Outcome: Progressing   Problem: Health Behavior/Discharge Planning: Goal: Ability to manage health-related needs will improve Outcome: Progressing   Problem: Clinical Measurements: Goal: Ability to maintain clinical measurements within normal limits will improve Outcome: Progressing Goal: Will remain free from infection Outcome: Progressing Goal: Diagnostic test results will improve Outcome: Progressing Goal: Respiratory complications will improve Outcome: Progressing Goal: Cardiovascular complication will be avoided Outcome: Progressing   Problem: Activity: Goal: Risk for activity intolerance will decrease Outcome: Progressing   Problem: Elimination: Goal: Will not experience complications related to bowel motility Outcome: Progressing Goal: Will not experience complications related to urinary retention Outcome: Progressing   Problem: Safety: Goal: Ability to remain free from injury will improve Outcome: Progressing   Problem: Skin Integrity: Goal: Risk for impaired skin integrity will decrease Outcome: Progressing   

## 2023-07-14 NOTE — Progress Notes (Signed)
Rounding Note    Patient Name: Craig Schmidt Date of Encounter: 07/14/2023  [facility] Cardiologist: [name], MD   Subjective   Renal function improved (creatinine 1.50 >1.17).  BP 125/75.  SpO2 99% on room air.  Reports continues to have some dyspnea  Inpatient Medications    Scheduled Meds:  apixaban  5 mg Oral BID   aspirin EC  81 mg Oral Daily   atorvastatin  40 mg Oral Daily   carvedilol  3.125 mg Oral BID   furosemide  40 mg Oral Daily   hydrocortisone-pramoxine   Rectal TID   nicotine  7 mg Transdermal Daily   sodium chloride flush  3 mL Intravenous Q12H   spironolactone  12.5 mg Oral Daily   umeclidinium bromide  1 puff Inhalation Daily   Continuous Infusions:  piperacillin-tazobactam 3.375 g (07/14/23 0913)   PRN Meds: acetaminophen, albuterol, nicotine polacrilex   Vital Signs    Vitals:   07/13/23 2042 07/14/23 0500 07/14/23 0525 07/14/23 0930  BP: 111/75  125/75   Pulse: 92  84   Resp: 16  16   Temp: 97.8 F (36.6 C)  98.1 F (36.7 C)   TempSrc: Oral     SpO2:   100% 96%  Weight:  63 kg    Height:        Intake/Output Summary (Last 24 hours) at 07/14/2023 1131 Last data filed at 07/14/2023 0400 Gross per 24 hour  Intake 297.33 ml  Output --  Net 297.33 ml      07/14/2023    5:00 AM 07/12/2023    5:00 AM 07/10/2023    6:13 PM  Last 3 Weights  Weight (lbs) 138 lb 14.2 oz 145 lb 1 oz 152 lb 1.9 oz  Weight (kg) 63 kg 65.8 kg 69 kg      Telemetry    NSR - Personally Reviewed  ECG    No new ECG- Personally Reviewed  Physical Exam   GEN: No acute distress.   Neck: Mild JVD Cardiac: RRR, no murmurs, rubs, or gallops.  Respiratory: Clear to auscultation bilaterally. GI: Soft, nontender, non-distended  MS: No edema; No deformity. Neuro:  Nonfocal  Psych: Normal affect   Labs    High Sensitivity Troponin:   Recent Labs  Lab 06/26/23 2303 07/10/23 0617 07/10/23 0930 07/10/23 2031 07/11/23 0402   TROPONINIHS 21* 28* 29* 41* 42*     Chemistry Recent Labs  Lab 07/10/23 0617 07/10/23 2032 07/11/23 0544 07/12/23 0524 07/13/23 0551 07/14/23 0650  NA 133*   < > 136 135 137 137  K 4.0   < > 3.2* 3.9 4.0 4.2  CL 105   < > 101 103 100 104  CO2 18*   < > 24 23 26 25   GLUCOSE 80   < > 151* 112* 100* 101*  BUN 21*   < > 23* 27* 27* 27*  CREATININE 1.10   < > 1.45* 1.19 1.50* 1.17  CALCIUM 8.8*   < > 8.8* 8.7* 8.9 8.9  MG  --   --  2.0  --  2.1 2.1  PROT 7.2  --   --  6.7  --   --   ALBUMIN 3.1*  --   --  2.8*  --   --   AST 33  --   --  21  --   --   ALT 11  --   --  8  --   --  ALKPHOS 114  --   --  93  --   --   BILITOT 0.7  --   --  0.7  --   --   GFRNONAA >60   < > 59* >60 56* >60  ANIONGAP 10   < > 11 9 11 8    < > = values in this interval not displayed.    Lipids No results for input(s): "CHOL", "TRIG", "HDL", "LABVLDL", "LDLCALC", "CHOLHDL" in the last 168 hours.  Hematology Recent Labs  Lab 07/11/23 0544 07/12/23 0524 07/14/23 0650  WBC 10.2 7.7 8.5  RBC 3.41* 3.27* 3.54*  HGB 9.9* 9.5* 10.4*  HCT 31.1* 30.4* 32.7*  MCV 91.2 93.0 92.4  MCH 29.0 29.1 29.4  MCHC 31.8 31.3 31.8  RDW 17.0* 17.0* 16.4*  PLT 292 263 318   Thyroid No results for input(s): "TSH", "FREET4" in the last 168 hours.  BNP Recent Labs  Lab 07/10/23 0617 07/10/23 2031  BNP 2,198.2* 2,597.3*    DDimer No results for input(s): "DDIMER" in the last 168 hours.   Radiology    DG Shoulder Right Result Date: 07/13/2023 CLINICAL DATA:  Right shoulder/neck pain. EXAM: RIGHT SHOULDER - 2+ VIEW COMPARISON:  Chest radiograph dated 07/11/2023. FINDINGS: There is no evidence of fracture or dislocation. There is mild acromioclavicular joint osteoarthritis. Soft tissues are unremarkable. IMPRESSION: Mild acromioclavicular joint osteoarthritis. Electronically Signed   By: Romona Curls M.D.   On: 07/13/2023 15:20    Cardiac Studies     Patient Profile     52 y.o. male  with a history of  CAD with STEMI in 11/2021 (cath should occluded apical LAD  - treated medically), chronic HFrEF with EF of <20% on Echo on 06/27/2023, mitral stenosis, CVA in 08/2021 s/p TNP, left vestibular schwannoma, polysubstance abuse (cocaine and alcohol), and homelessness who is being seen for the evaluation of CHF   Assessment & Plan    Acute on Chronic Combined heart failure Biventricular Failure Patient initially diagnosed with CHF in 08/2021 in setting of acute stroke. EF 25-30% at that time. Cardiomyopathy felt to be secondary to polysubstance abuse +/- uncontrolled hypertension.He has had multiple admission for CHF in the past often due to medication compliance. EF has remained severe reduced. Most recent Echo on 06/27/2023 showed LVEF of <20% with global hypokinesis and grade 2 diastolic dysfunction, moderately reduced RV function, biatrial enlargement (left > right), and moderate mitral stenosis. He has had multiple admission over the last 1.5 months for persistent multifocal pneumonia. He now presents with worsening dyspnea on exertion and lower extremity edema and there is concern for CHF involvement. BNP markedly elevated at 2,198 (up from 711 during last admission). Chest x-ray showed chronic cardiomegaly with stable to improved opacities in the lower lungs. CT showed similar degree of patchy bilateral pneumonia with subtl cavitary features as well as some suggestion of CHF. He was started on IV Lasix. - Volume status improved, transitioned to PO lasix 40 mg daily - Continue Coreg 3.125mg  twice daily.  - Started Spironolactone 12.5mg  daily. - Previously was on Jardiance. Will add Jardiance 10 mg daily   CAD History of STEMI in 11/2021. LHC showed occluded apical CAD - felt to be embolic vs plaque rupture in setting of cocaine abuse. Treated medically. He does described some occasional intermittent chest pain both a rest and with exertion over the last couple of week. High-sensitivity troponin minimally  elevated and flat at 28 >> 29 >> 41 >> 42. EKG showed no  acute ischemic changes.  - Suspect chest pain may be due to ongoing cocaine use, underlying pneumonia, or volume overload.  -He is on Plavix 75 mg daily at home.  From review of record, appears was started on Plavix plus Eliquis following STEMI in 11/2021.  Plan from heart failure clinic was to convert to Eliquis plus aspirin after 1 year, but appears Eliquis was discontinued during recent hospitalization for unclear reasons.  Recommend discontinuing Plavix and starting on Eliquis plus aspirin - Would not pursue any additional ischemic evaluation at this time.    Moderate Mitral Stenosis Initially diagnosed in 08/2021. Also noted on last Echo on 06/27/2023. Will monitor   Hypertension BP well controlled.  - Continue medications for CHF as above.   CKD Stage III Creatinine 1.54 on admission. Baseline Creatinine around 1.2 to 1.5. - Cr 1.2 today   Otherwise, per primary team: - Persistent multifocal pneumonia with necrotizing  - History of CVA - History of Vestibular schwannoma - Substance use disorder (ongoing tobacco, alcohol, and cocaine use),    For questions or updates, please contact [facility] Please consult [email or web address] for contact info under        Signed, Little Ishikawa, MD  07/14/2023, 11:31 AM

## 2023-07-14 NOTE — Progress Notes (Signed)
Orthopedic Tech Progress Note Patient Details:  ARTIS SOUTHAM 05-05-1971 [id number]  Ortho Devices Type of Ortho Device: Shoulder immobilizer Ortho Device/Splint Location: RUE Ortho Device/Splint Interventions: Ordered, Application, Adjustment   Post Interventions Patient Tolerated: Well Instructions Provided: Care of device  Craig Schmidt 07/14/2023, 11:17 AM

## 2023-07-14 NOTE — Plan of Care (Signed)
No acute events overnight. Problem: Education: Goal: Knowledge of General Education information will improve Description: Including pain rating scale, medication(s)/side effects and non-pharmacologic comfort measures Outcome: Progressing   Problem: Health Behavior/Discharge Planning: Goal: Ability to manage health-related needs will improve Outcome: Progressing   Problem: Clinical Measurements: Goal: Ability to maintain clinical measurements within normal limits will improve Outcome: Progressing Goal: Will remain free from infection Outcome: Progressing Goal: Diagnostic test results will improve Outcome: Progressing Goal: Respiratory complications will improve Outcome: Progressing Goal: Cardiovascular complication will be avoided Outcome: Progressing   Problem: Activity: Goal: Risk for activity intolerance will decrease Outcome: Progressing   Problem: Nutrition: Goal: Adequate nutrition will be maintained Outcome: Progressing   Problem: Coping: Goal: Level of anxiety will decrease Outcome: Progressing   Problem: Elimination: Goal: Will not experience complications related to bowel motility Outcome: Progressing Goal: Will not experience complications related to urinary retention Outcome: Progressing   Problem: Pain Management: Goal: General experience of comfort will improve Outcome: Progressing   Problem: Safety: Goal: Ability to remain free from injury will improve Outcome: Progressing   Problem: Skin Integrity: Goal: Risk for impaired skin integrity will decrease Outcome: Progressing

## 2023-07-14 NOTE — Progress Notes (Signed)
Mobility Specialist - Progress Note   07/14/23 1323  Mobility  Activity Ambulated with assistance in hallway  Level of Assistance Contact guard assist, steadying assist  Assistive Device Cane  Distance Ambulated (ft) 80 ft  Range of Motion/Exercises Active  Activity Response Tolerated well  Mobility Referral Yes  Mobility visit 1 Mobility  Mobility Specialist Start Time (ACUTE ONLY) 1305  Mobility Specialist Stop Time (ACUTE ONLY) 1322  Mobility Specialist Time Calculation (min) (ACUTE ONLY) 17 min   Received in bed and agreed to mobility. Had no issues throughout session. Returned to bed with all needs met.  Marilynne Halsted Mobility Specialist

## 2023-07-15 ENCOUNTER — Other Ambulatory Visit (HOSPITAL_COMMUNITY): Payer: Self-pay

## 2023-07-15 ENCOUNTER — Other Ambulatory Visit: Payer: Self-pay

## 2023-07-15 DIAGNOSIS — J189 Pneumonia, unspecified organism: Secondary | ICD-10-CM | POA: Diagnosis not present

## 2023-07-15 DIAGNOSIS — I2510 Atherosclerotic heart disease of native coronary artery without angina pectoris: Secondary | ICD-10-CM | POA: Diagnosis not present

## 2023-07-15 DIAGNOSIS — I5043 Acute on chronic combined systolic (congestive) and diastolic (congestive) heart failure: Secondary | ICD-10-CM | POA: Diagnosis not present

## 2023-07-15 LAB — ACID FAST CULTURE WITH REFLEXED SENSITIVITIES (MYCOBACTERIA)
Acid Fast Culture: NEGATIVE
Acid Fast Culture: NEGATIVE

## 2023-07-15 LAB — MAGNESIUM: Magnesium: 2.5 mg/dL — ABNORMAL HIGH (ref 1.7–2.4)

## 2023-07-15 LAB — BASIC METABOLIC PANEL
Anion gap: 10 (ref 5–15)
BUN: 28 mg/dL — ABNORMAL HIGH (ref 6–20)
CO2: 25 mmol/L (ref 22–32)
Calcium: 9 mg/dL (ref 8.9–10.3)
Chloride: 101 mmol/L (ref 98–111)
Creatinine, Ser: 1.46 mg/dL — ABNORMAL HIGH (ref 0.61–1.24)
GFR, Estimated: 58 mL/min — ABNORMAL LOW (ref >60)
Glucose, Bld: 97 mg/dL (ref 70–99)
Potassium: 4.4 mmol/L (ref 3.5–5.1)
Sodium: 136 mmol/L (ref 135–145)

## 2023-07-15 MED ORDER — SACUBITRIL-VALSARTAN 24-26 MG PO TABS
1.0000 | ORAL_TABLET | Freq: Two times a day (BID) | ORAL | 0 refills | Status: DC
  Filled 2023-07-15: qty 60, 30d supply, fill #0

## 2023-07-15 MED ORDER — IPRATROPIUM-ALBUTEROL 0.5-2.5 (3) MG/3ML IN SOLN: 3.0000 mL | RESPIRATORY_TRACT | Status: DC | PRN

## 2023-07-15 MED ORDER — SACUBITRIL-VALSARTAN 24-26 MG PO TABS
1.0000 | ORAL_TABLET | Freq: Two times a day (BID) | ORAL | Status: DC
  Filled 2023-07-15 (×2): qty 1

## 2023-07-15 MED ORDER — ONDANSETRON HCL 4 MG/2ML IJ SOLN: 4.0000 mg | Freq: Four times a day (QID) | INTRAMUSCULAR | Status: DC | PRN

## 2023-07-15 MED ORDER — SENNOSIDES-DOCUSATE SODIUM 8.6-50 MG PO TABS: 1.0000 | ORAL_TABLET | Freq: Every evening | ORAL | Status: DC | PRN

## 2023-07-15 MED ORDER — HYDRALAZINE HCL 20 MG/ML IJ SOLN: 10.0000 mg | INTRAMUSCULAR | Status: DC | PRN

## 2023-07-15 NOTE — Progress Notes (Signed)
Rounding Note    Patient Name: Craig Schmidt Date of Encounter: 07/15/2023  [facility] Cardiologist: [name], MD   Subjective   He is not having significant dyspnea.  He does report that he has acute dyspnea at night and wonders if he has sleep apnea  Inpatient Medications    Scheduled Meds:  apixaban  5 mg Oral BID   aspirin EC  81 mg Oral Daily   atorvastatin  40 mg Oral Daily   carvedilol  3.125 mg Oral BID   empagliflozin  10 mg Oral Daily   furosemide  40 mg Oral Daily   hydrocortisone-pramoxine   Rectal TID   nicotine  7 mg Transdermal Daily   sodium chloride flush  3 mL Intravenous Q12H   spironolactone  12.5 mg Oral Daily   umeclidinium bromide  1 puff Inhalation Daily   Continuous Infusions:  piperacillin-tazobactam 3.375 g (07/15/23 1123)   PRN Meds: acetaminophen, hydrALAZINE, ipratropium-albuterol, nicotine polacrilex, ondansetron (ZOFRAN) IV, senna-docusate   Vital Signs    Vitals:   07/15/23 0811 07/15/23 0812 07/15/23 1116 07/15/23 1119  BP:   124/86 124/86  Pulse:   87 87  Resp:   16   Temp:   97.6 F (36.4 C)   TempSrc:   Oral   SpO2: 99% 99% 100%   Weight:      Height:        Intake/Output Summary (Last 24 hours) at 07/15/2023 1253 Last data filed at 07/15/2023 1100 Gross per 24 hour  Intake 863.07 ml  Output 950 ml  Net -86.93 ml      07/15/2023    1:28 AM 07/14/2023    5:00 AM 07/12/2023    5:00 AM  Last 3 Weights  Weight (lbs) 142 lb 6.7 oz 138 lb 14.2 oz 145 lb 1 oz  Weight (kg) 64.6 kg 63 kg 65.8 kg      Telemetry    NSR - Personally Reviewed  ECG    No new ECG- Personally Reviewed  Physical Exam   GEN: No acute distress.   Neck: Mild JVD Cardiac: RRR, no murmurs, rubs, or gallops.  Respiratory: Clear to auscultation bilaterally. GI: Soft, nontender, non-distended  MS: No edema; No deformity. Neuro:  Nonfocal  Psych: Normal affect   Labs    High Sensitivity Troponin:   Recent Labs   Lab 06/26/23 2303 07/10/23 0617 07/10/23 0930 07/10/23 2031 07/11/23 0402  TROPONINIHS 21* 28* 29* 41* 42*     Chemistry Recent Labs  Lab 07/10/23 0617 07/10/23 2032 07/12/23 0524 07/13/23 0551 07/14/23 0650 07/15/23 0735  NA 133*   < > 135 137 137 136  K 4.0   < > 3.9 4.0 4.2 4.4  CL 105   < > 103 100 104 101  CO2 18*   < > 23 26 25 25   GLUCOSE 80   < > 112* 100* 101* 97  BUN 21*   < > 27* 27* 27* 28*  CREATININE 1.10   < > 1.19 1.50* 1.17 1.46*  CALCIUM 8.8*   < > 8.7* 8.9 8.9 9.0  MG  --    < >  --  2.1 2.1 2.5*  PROT 7.2  --  6.7  --   --   --   ALBUMIN 3.1*  --  2.8*  --   --   --   AST 33  --  21  --   --   --  ALT 11  --  8  --   --   --   ALKPHOS 114  --  93  --   --   --   BILITOT 0.7  --  0.7  --   --   --   GFRNONAA >60   < > >60 56* >60 58*  ANIONGAP 10   < > 9 11 8 10    < > = values in this interval not displayed.    Lipids No results for input(s): "CHOL", "TRIG", "HDL", "LABVLDL", "LDLCALC", "CHOLHDL" in the last 168 hours.  Hematology Recent Labs  Lab 07/11/23 0544 07/12/23 0524 07/14/23 0650  WBC 10.2 7.7 8.5  RBC 3.41* 3.27* 3.54*  HGB 9.9* 9.5* 10.4*  HCT 31.1* 30.4* 32.7*  MCV 91.2 93.0 92.4  MCH 29.0 29.1 29.4  MCHC 31.8 31.3 31.8  RDW 17.0* 17.0* 16.4*  PLT 292 263 318   Thyroid No results for input(s): "TSH", "FREET4" in the last 168 hours.  BNP Recent Labs  Lab 07/10/23 0617 07/10/23 2031  BNP 2,198.2* 2,597.3*    DDimer No results for input(s): "DDIMER" in the last 168 hours.   Radiology    DG Shoulder Right Result Date: 07/13/2023 CLINICAL DATA:  Right shoulder/neck pain. EXAM: RIGHT SHOULDER - 2+ VIEW COMPARISON:  Chest radiograph dated 07/11/2023. FINDINGS: There is no evidence of fracture or dislocation. There is mild acromioclavicular joint osteoarthritis. Soft tissues are unremarkable. IMPRESSION: Mild acromioclavicular joint osteoarthritis. Electronically Signed   By: Romona Curls M.D.   On: 07/13/2023 15:20     Cardiac Studies   TTE 06/27/2023 1. Left ventricular ejection fraction, by estimation, is <20%. The left  ventricle has severely decreased function. The left ventricle demonstrates  global hypokinesis. The left ventricular internal cavity size was mildly  to moderately dilated. Left  ventricular diastolic parameters are consistent with Grade II diastolic  dysfunction (pseudonormalization).   2. Right ventricular systolic function is moderately reduced. The right  ventricular size is mildly enlarged.   3. Left atrial size was severely dilated.   4. Right atrial size was mildly dilated.   5. The mitral valve is grossly normal. There is mild thickening of the  mitral valve with restricted excursion of valve leaflets. Moderate mitral  stenosis.   6. The aortic valve is normal in structure. Aortic valve regurgitation is  trivial.   7. No overt signs of endocarditis.   8. The inferior vena cava is normal in size with greater than 50%  respiratory variability, suggesting right atrial pressure of 3 mmHg.   Patient Profile     52 y.o. male  with a history of CAD with STEMI in 11/2021 (cath should occluded apical LAD  - treated medically), chronic HFrEF with EF of <20% on Echo on 06/27/2023, mitral stenosis, CVA in 08/2021 s/p TNP, left vestibular schwannoma, polysubstance abuse (cocaine and alcohol), and homelessness; multiple hospital visits, who is being seen for the evaluation of CHF   Assessment & Plan    Acute on Chronic Combined heart failure Biventricular Failure Patient initially diagnosed with CHF in 08/2021 in setting of acute stroke. EF 25-30% at that time. Cardiomyopathy felt to be secondary to polysubstance abuse +/- uncontrolled hypertension.He has had multiple admission for CHF in the past often due to medication compliance. EF has remained severe reduced. Most recent Echo on 06/27/2023 showed LVEF of <20% with global hypokinesis and grade 2 diastolic dysfunction, moderately  reduced RV function, biatrial enlargement (left > right), and moderate  mitral stenosis. He has had multiple admission over the last 1.5 months for persistent multifocal pneumonia. He now presents with worsening dyspnea on exertion and lower extremity edema and there is concern for CHF involvement. BNP markedly elevated at 2,198 (up from 711 during last admission). Chest x-ray showed chronic cardiomegaly with stable to improved opacities in the lower lungs. CT showed similar degree of patchy bilateral pneumonia with subtl cavitary features as well as some suggestion of CHF. He was started on IV Lasix. - Volume status improved, transitioned to PO lasix 40 mg daily - Continue Coreg 3.125mg  twice daily.  - Continue spironolactone 12.5mg  daily.  Continue to monitor renal function, GFR just barely reduced -Continue Jardiance 10 mg daily -He was on Entresto in the past, not reported here.  I will restart this. -Agree with an outpatient sleep study    CAD History of STEMI in 11/2021. LHC showed occluded apical CAD - felt to be embolic vs plaque rupture in setting of cocaine abuse. Treated medically. He does described some occasional intermittent chest pain both a rest and with exertion over the last couple of week. High-sensitivity troponin minimally elevated and flat at 28 >> 29 >> 41 >> 42. EKG showed no acute ischemic changes.  - Suspect chest pain may be due to ongoing cocaine use, underlying pneumonia, or volume overload.  -He is on Plavix 75 mg daily at home.  From review of record, appears was started on Plavix plus Eliquis following STEMI in 11/2021.  Plan from heart failure clinic was to convert to Eliquis plus aspirin after 1 year, but appears Eliquis was discontinued during recent hospitalization for unclear reasons.  Recommend discontinuing Plavix and starting on Eliquis plus aspirin. - Would not pursue any additional ischemic evaluation at this time.   CVA Cardioembolic stroke was suspected.  He  has not had documented atrial fibrillation.   Moderate Mitral Stenosis Initially diagnosed in 08/2021. Also noted on last Echo on 06/27/2023.  Prior concern for rheumatic valve disease. Will monitor.  He has a complicated social circumstance with homelessness and management has been challenging   Hypertension BP well controlled.  - Continue medications for CHF as above.   CKD Stage III Creatinine 1.54 on admission. Baseline Creatinine around 1.2 to 1.5. - Cr bumped, GFR 58 only mildly reduced.    Otherwise, per primary team: - Persistent multifocal pneumonia with necrotizing  - History of CVA - History of Vestibular schwannoma - Substance use disorder (ongoing tobacco, alcohol, and cocaine use)  Monitoring compliance is challenging with him due to his homelessness and missed appointments.  For this admission there are no anticipated changes with his medications except for above.  Recommend plan for a BMET in 2 weeks.  We can work on follow-up, otherwise without anticipated changes cardiology will sign off.  Do not hesitate to reach out for further questions   Time Spent Directly with Patient:  I have spent a total of 35 minutes with the patient reviewing hospital notes, telemetry, EKGs, labs and examining the patient as well as establishing an assessment and plan that was discussed personally with the patient.       For questions or updates, please contact [facility] Please consult [email or web address] for contact info under        Signed, [name], MD  07/15/2023, 12:53 PM

## 2023-07-15 NOTE — Hospital Course (Addendum)
Brief Narrative:  52 y.o. 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  During the hospitalization found to have multifocal pneumonia with necrotizing component and volume overload. During hospitalization echocardiogram showed EF of 20%, seen by cardiology team.  Medications were adjusted as mentioned below.  BAL performed 12/11 and bacterial workup was negative, procalcitonin was also negative.  Rest of the viral workup remained unremarkable.  Eventually is recommended the patient to remain on IV Zosyn during hospitalization and discharged on p.o. Augmentin for a month month and have him follow-up outpatient.  Assessment & Plan:  Principal Problem:   Multifocal pneumonia    Acute biventricular failure with reduced EF, 20% Moderate mitral valve stenosis History of CAD Echo 12/5 showed EF less than 20%.  Cardiology team following.  Now on Lasix p.o. 40 mg daily, Coreg, Aldactone, Jardiance, Entresto Current recommendations Eliquis and aspirin  Polysubstance abuse - Positive for cocaine, will defer beta-blocker use in the setting of cocaine to cardiology.    History of a multifocal pneumonia with necrotizing component Status post BAL 12/11.  Bacterial workup is negative.  Procalcitonin negative.  Respiratory viral panel negative.  Previous provider discussed case with pulmonary.  Recommending continuing IV Zosyn in the hospital thereafter transition to p.o. Augmentin upon  discharge     AKI stage I Background CKD stage II Baseline creatinine approximately 1.1, elevated 1.5 at admission.  Suspected cardiorenal - Diuresis per above   Substance use disorder Current smoking Counseled to quit using     History of stroke suspected cardioembolic Currently on Eliquis, aspirin, Lipitor   Suspected COPD  Continue home Incruse Ellipta, albuterol    Shoulder pain secondary to osteoarthritis X-ray of the shoulder showing osteoarthritis   DVT prophylaxis: apixaban (ELIQUIS) tablet 5 mg      Code Status: Full Code Family Communication: Friend at bedside Status is: Inpatient Remains inpatient appropriate because: Discharge today once we have safe disposition    Subjective: Seen and examined at bedside, no complaints this morning.   Examination:  General exam: Appears calm and comfortable  Respiratory system: Clear to auscultation. Respiratory effort normal. Cardiovascular system: S1 & S2 heard, RRR. No JVD, murmurs, rubs, gallops or clicks. No pedal edema. Gastrointestinal system: Abdomen is nondistended, soft and nontender. No organomegaly or masses felt. Normal bowel sounds heard. Central nervous system: Alert and oriented.  Chronic dysarthria and right upper extremity weakness Extremities: Symmetric 4 x 5 power. Skin: No rashes, lesions or ulcers Psychiatry: Judgement and insight appear normal. Mood & affect appropriate.

## 2023-07-15 NOTE — Plan of Care (Signed)
  Problem: Clinical Measurements: Goal: Respiratory complications will improve Outcome: Progressing Goal: Cardiovascular complication will be avoided Outcome: Progressing   Problem: Activity: Goal: Risk for activity intolerance will decrease Outcome: Progressing   Problem: Coping: Goal: Level of anxiety will decrease Outcome: Progressing   Problem: Pain Management: Goal: General experience of comfort will improve Outcome: Progressing   Problem: Safety: Goal: Ability to remain free from injury will improve Outcome: Progressing

## 2023-07-15 NOTE — Plan of Care (Signed)
  Problem: Nutrition: Goal: Adequate nutrition will be maintained Outcome: Progressing   Problem: Pain Management: Goal: General experience of comfort will improve Outcome: Progressing   Problem: Safety: Goal: Ability to remain free from injury will improve Outcome: Progressing

## 2023-07-15 NOTE — TOC Transition Note (Signed)
Transition of Care [facility]) - Discharge Note   Patient Details  Name: Craig Schmidt MRN: [id number] Date of Birth: 26-Oct-1970  Transition [facility]) CM/SW Contact:  Darleene Cleaver, LCSW Phone Number: 07/15/2023, 7:01 PM   Clinical Narrative:     CSW was informed that patient will need a cab voucher to get back to where he is staying.  CSW was able to provide cab voucher to [address], [place], [address].  Pharmacy was able to deliver medications to patient.  Homeless shelters, substance abuse resources, and DSS information provided for patient on AVS.  TOC signing off, cab voucher given to bedside nurse to arrange cab ride once patient is ready for discharge.  Final next level of care: Home/Self Care Barriers to Discharge: Barriers Resolved   Patient Goals and CMS Choice Patient states their goals for this hospitalization and ongoing recovery are:: To go to apartment where he is staying.          Discharge Placement                       Discharge Plan and Services Additional resources added to the After Visit Summary for                                       Social Drivers of Health (SDOH) Interventions SDOH Screenings   Food Insecurity: Food Insecurity Present (07/11/2023)  Housing: High Risk (07/11/2023)  Transportation Needs: Unmet Transportation Needs (07/11/2023)  Utilities: At Risk (07/11/2023)  Alcohol Screen: High Risk (09/19/2021)  Depression (PHQ2-9): High Risk (06/13/2022)  Financial Resource Strain: High Risk (09/07/2022)  Stress: Stress Concern Present (10/16/2022)  Tobacco Use: High Risk (07/10/2023)     Readmission Risk Interventions    07/12/2023    1:06 PM 06/28/2023    1:27 PM 06/21/2023   12:50 PM  Readmission Risk Prevention Plan  Transportation Screening Complete Complete Complete  PCP or Specialist Appt within 3-5 Days  Complete   HRI or Home Care Consult  Complete   Social Work Consult for  Recovery Care Planning/Counseling  Complete   Palliative Care Screening  Not Applicable   Medication Review Oceanographer) Complete Complete Complete  PCP or Specialist appointment within 3-5 days of discharge Complete  Complete  HRI or Home Care Consult Complete  Complete  SW Recovery Care/Counseling Consult Complete  Complete  Palliative Care Screening Not Applicable  Not Applicable  Skilled Nursing Facility Not Applicable  Not Applicable

## 2023-07-15 NOTE — Progress Notes (Signed)
Physical Therapy Treatment Patient Details Name: Craig Schmidt MRN: [id number] DOB: 18-Jul-1971 Today's Date: 07/15/2023   History of Present Illness 52 y.o. male who presents with progressive dyspnea and cough, worsening lower extremity swelling on 07/10/23. Pt with acute CHF and PNE. Pt with hx of recent multifocal pneumonia with necrotizing component with admission earlier december 2024 and 11/24-11/30 as well as leaving AMA 11/08. Pt + cocaine at admission. PMH: ICM with prior hx STEMI, biventricular failure LVEF less than 20% and moderately reduced RV systolic function, moderate mitral stenosis, ongoing substance use with crack, current cigarette smoker, CVA with residual right upper extremity weakness, CKD stage II    PT Comments  Pt able to ambulate in hallway and perform 1 step today all with CGA.  He has hx of R UE hemiparesis - utilized sling today.  Pt ambulated 300' but with 5 standing rest breaks.  Pt reports being near baseline strength/balance/gait just more dyspnea/rest breaks.  He denies falls at baseline.  Pt appears to be near his baseline and has no PT needs at d/c.  Will continue to progress cardiopulmonary endurance while hospitalized.     If plan is discharge home, recommend the following: Assistance with cooking/housework;Assist for transportation;Help with stairs or ramp for entrance;A little help with walking and/or transfers;A little help with bathing/dressing/bathroom   Can travel by private vehicle        Equipment Recommendations  None recommended by PT    Recommendations for Other Services       Precautions / Restrictions Precautions Precautions: Fall Precaution Comments: Baseline R hemi. R shoulder subluxation -requested new sling from ortho tech (same report last admission)     Mobility  Bed Mobility Overal bed mobility: Needs Assistance Bed Mobility: Supine to Sit, Sit to Supine     Supine to sit: Supervision Sit to supine: Supervision         Transfers Overall transfer level: Needs assistance Equipment used: Straight cane Transfers: Sit to/from Stand Sit to Stand: Contact guard assist           General transfer comment: Performed x 2 with CGA    Ambulation/Gait Ambulation/Gait assistance: Contact guard assist, Supervision Gait Distance (Feet): 300 Feet Assistive device: Straight cane Gait Pattern/deviations: Step-to pattern, Decreased stride length, Shuffle, Wide base of support Gait velocity: decreased     General Gait Details: Pt using personal SPC (too tall but he prefers and holds below handle).  He ambulated total of 300' with 5 standing rest breaks.  Reports gait quality similar to baseline but just more dyspnea and more rest breaks. Pt with altered gait pattern due to R hemiparesis -suspect near baseline.  Educated on taking rest breaks as needed and gradually increasing endurance.  Pt expressed some difficulty doing this as homeless and has to walk longer distances.  Reports having a rollator and w/c - discussed using for breaks.   Stairs Stairs: Yes Stairs assistance: Contact guard assist Stair Management: One rail Left, With cane Number of Stairs: 1 General stair comments: CGA for sequencing.  Educated on Up good and down bad but pt reports bil LE equal   Wheelchair Mobility     Tilt Bed    Modified Rankin (Stroke Patients Only)       Balance Overall balance assessment: Needs assistance Sitting-balance support: Feet supported, No upper extremity supported Sitting balance-Leahy Scale: Good     Standing balance support: Single extremity supported, No upper extremity supported Standing balance-Leahy Scale: Fair Standing balance comment: static  stand without support; cane for ambulation but does lift it completely at times                            Cognition Arousal: Alert Behavior During Therapy: [facility] for tasks assessed/performed Overall Cognitive Status: Within Functional  Limits for tasks assessed                                 General Comments: Likely baseline; increased time to express thoughts at times        Exercises      General Comments General comments (skin integrity, edema, etc.): Pt on RA with sats 100%.  HR 90's during ambulation      Pertinent Vitals/Pain Pain Assessment Pain Assessment: No/denies pain    Home Living                          Prior Function            PT Goals (current goals can now be found in the care plan section) Progress towards PT goals: Progressing toward goals    Frequency    Min 1X/week      PT Plan      Co-evaluation              AM-PAC PT "6 Clicks" Mobility   Outcome Measure  Help needed turning from your back to your side while in a flat bed without using bedrails?: None Help needed moving from lying on your back to sitting on the side of a flat bed without using bedrails?: None Help needed moving to and from a bed to a chair (including a wheelchair)?: A Little Help needed standing up from a chair using your arms (e.g., wheelchair or bedside chair)?: A Little Help needed to walk in hospital room?: A Little Help needed climbing 3-5 steps with a railing? : A Little 6 Click Score: 20    End of Session Equipment Utilized During Treatment: Gait belt Activity Tolerance: Patient tolerated treatment well Patient left: with call bell/phone within reach;with family/visitor present;in bed;with bed alarm set Nurse Communication: Mobility status PT Visit Diagnosis: Difficulty in walking, not elsewhere classified (R26.2);Muscle weakness (generalized) (M62.81);Unsteadiness on feet (R26.81)     Time: 4742-5956 PT Time Calculation (min) (ACUTE ONLY): 36 min  Charges:    $Gait Training: 8-22 mins $Therapeutic Activity: 8-22 mins PT General Charges $$ ACUTE PT VISIT: 1 Visit                     Anise Salvo, PT Acute Rehab Services [facility]  [phone]    Rayetta Humphrey 07/15/2023, 1:04 PM

## 2023-07-15 NOTE — Progress Notes (Signed)
Message sent to office staff for arranging AHF clinic follow up appointment per Dr Wyline Mood request.

## 2023-07-15 NOTE — Progress Notes (Signed)
PROGRESS NOTE    Craig Schmidt  UJW:[id number] DOB: July 30, 1970 DOA: 07/10/2023 PCP: Rema Fendt, NP    Brief Narrative:  52 y.o. 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  During the hospitalization found to have multifocal pneumonia with necrotizing component and volume overload. During hospitalization echocardiogram showed EF of 20%, seen by cardiology team.  Medications were adjusted as mentioned below.  BAL performed 12/11 and bacterial workup was negative, procalcitonin was also negative.  Rest of the viral workup remained unremarkable.  Eventually is recommended the patient to remain on IV Zosyn during hospitalization and discharged on p.o. Augmentin for a month month and have him follow-up outpatient.  Assessment & Plan:  Principal Problem:   Multifocal pneumonia    Acute biventricular failure with reduced EF, 20% Moderate mitral valve stenosis History of CAD Echo 12/5 showed EF less than 20%.  Cardiology team following.  Now on Lasix p.o. 40 mg daily, Coreg, Aldactone, Jardiance, Entresto Current recommendations Eliquis and aspirin  Polysubstance abuse - Positive for cocaine, will defer beta-blocker use in the setting of cocaine to cardiology.    History of a multifocal pneumonia with necrotizing component Status post BAL 12/11.  Bacterial workup is negative.  Procalcitonin negative.  Respiratory viral panel negative.  Previous provider  discussed case with pulmonary.  Recommending continuing IV Zosyn in the hospital thereafter transition to p.o. Augmentin upon discharge     AKI stage I Background CKD stage II Baseline creatinine approximately 1.1, elevated 1.5 at admission.  Suspected cardiorenal - Diuresis per above   Substance use disorder Current smoking Counseled to quit using     History of stroke suspected cardioembolic Currently on Eliquis, aspirin, Lipitor   Suspected COPD  Continue home Incruse Ellipta, albuterol    Shoulder pain secondary to osteoarthritis X-ray of the shoulder showing osteoarthritis   DVT prophylaxis: apixaban (ELIQUIS) tablet 5 mg      Code Status: Full Code Family Communication: Friend at bedside Status is: Inpatient Remains inpatient appropriate because: Discharge today once we have safe disposition    Subjective: Seen and examined at bedside, no complaints this morning.   Examination:  General exam: Appears calm and comfortable  Respiratory system: Clear to auscultation. Respiratory effort normal. Cardiovascular system: S1 & S2 heard, RRR. No JVD, murmurs, rubs, gallops or clicks. No pedal edema. Gastrointestinal system: Abdomen is nondistended, soft and nontender. No organomegaly or masses felt. Normal bowel sounds heard. Central nervous system: Alert and oriented.  Chronic dysarthria and right upper extremity weakness Extremities: Symmetric 4 x 5 power. Skin: No rashes, lesions or ulcers Psychiatry: Judgement and insight appear normal. Mood & affect appropriate.         Diet Orders (From admission, onward)     Start     Ordered   07/11/23 0413  Diet regular Room service appropriate? Yes; Fluid consistency: Thin  Diet effective now       Question Answer Comment  Room service appropriate? Yes   Fluid consistency: Thin      07/11/23 0419  Objective: Vitals:   07/15/23 0812 07/15/23 1116 07/15/23 1119 07/15/23 1331  BP:  124/86 124/86 (!)  133/96  Pulse:  87 87 97  Resp:  16  15  Temp:  97.6 F (36.4 C)  98.1 F (36.7 C)  TempSrc:  Oral  Oral  SpO2: 99% 100%  (!) 73%  Weight:      Height:        Intake/Output Summary (Last 24 hours) at 07/15/2023 1350 Last data filed at 07/15/2023 1100 Gross per 24 hour  Intake 863.07 ml  Output 950 ml  Net -86.93 ml   Filed Weights   07/12/23 0500 07/14/23 0500 07/15/23 0128  Weight: 65.8 kg 63 kg 64.6 kg    Scheduled Meds:  apixaban  5 mg Oral BID   aspirin EC  81 mg Oral Daily   atorvastatin  40 mg Oral Daily   carvedilol  3.125 mg Oral BID   empagliflozin  10 mg Oral Daily   furosemide  40 mg Oral Daily   hydrocortisone-pramoxine   Rectal TID   nicotine  7 mg Transdermal Daily   sacubitril-valsartan  1 tablet Oral BID   sodium chloride flush  3 mL Intravenous Q12H   spironolactone  12.5 mg Oral Daily   umeclidinium bromide  1 puff Inhalation Daily   Continuous Infusions:  piperacillin-tazobactam 3.375 g (07/15/23 1123)    Nutritional status     Body mass index is 22.31 kg/m.  Data Reviewed:   CBC: Recent Labs  Lab 07/10/23 0617 07/10/23 2032 07/11/23 0544 07/12/23 0524 07/14/23 0650  WBC 11.2* 10.0 10.2 7.7 8.5  NEUTROABS 7.8*  --  6.9  --   --   HGB 9.8* 10.3* 9.9* 9.5* 10.4*  HCT 31.0* 31.4* 31.1* 30.4* 32.7*  MCV 91.4 89.2 91.2 93.0 92.4  PLT 311 344 292 263 318   Basic Metabolic Panel: Recent Labs  Lab 07/11/23 0544 07/12/23 0524 07/13/23 0551 07/14/23 0650 07/15/23 0735  NA 136 135 137 137 136  K 3.2* 3.9 4.0 4.2 4.4  CL 101 103 100 104 101  CO2 24 23 26 25 25   GLUCOSE 151* 112* 100* 101* 97  BUN 23* 27* 27* 27* 28*  CREATININE 1.45* 1.19 1.50* 1.17 1.46*  CALCIUM 8.8* 8.7* 8.9 8.9 9.0  MG 2.0  --  2.1 2.1 2.5*  PHOS 3.1  --   --   --   --    GFR: Estimated Creatinine Clearance: 55.3 mL/min (A) (by C-G formula based on SCr of 1.46 mg/dL (H)). Liver Function Tests: Recent Labs  Lab 07/10/23 0617 07/12/23 0524  AST 33 21   ALT 11 8  ALKPHOS 114 93  BILITOT 0.7 0.7  PROT 7.2 6.7  ALBUMIN 3.1* 2.8*   No results for input(s): "LIPASE", "AMYLASE" in the last 168 hours. No results for input(s): "AMMONIA" in the last 168 hours. Coagulation Profile: No results for input(s): "INR", "PROTIME" in the last 168 hours. Cardiac Enzymes: No results for input(s): "CKTOTAL", "CKMB", "CKMBINDEX", "TROPONINI" in the last 168 hours. BNP (last 3 results) No results for input(s): "PROBNP" in the last 8760 hours. HbA1C: No results for input(s): "HGBA1C" in the last 72 hours. CBG: Recent Labs  Lab 07/10/23 1832  GLUCAP 99   Lipid Profile: No results for input(s): "CHOL", "HDL", "LDLCALC", "TRIG", "CHOLHDL", "LDLDIRECT" in the last 72 hours. Thyroid Function Tests: No results for input(s): "TSH", "T4TOTAL", "FREET4", "T3FREE", "THYROIDAB" in the last 72 hours. Anemia Panel: No results for input(s): "  VITAMINB12", "FOLATE", "FERRITIN", "TIBC", "IRON", "RETICCTPCT" in the last 72 hours. Sepsis Labs: Recent Labs  Lab 07/11/23 0544  PROCALCITON <0.10    Recent Results (from the past 240 hours)  MRSA Next Gen by PCR, Nasal     Status: None   Collection Time: 07/11/23  3:39 AM   Specimen: Nasal Mucosa; Nasal Swab  Result Value Ref Range Status   MRSA by PCR Next Gen NOT DETECTED NOT DETECTED Final    Comment: (NOTE) The GeneXpert MRSA Assay (FDA approved for NASAL specimens only), is one component of a comprehensive MRSA colonization surveillance program. It is not intended to diagnose MRSA infection nor to guide or monitor treatment for MRSA infections. Test performance is not FDA approved in patients less than 51 years old. Performed at [facility], [address], [place], [place] [address]   Resp panel by RT-PCR (RSV, Flu A&B, Covid) Nasal Mucosa     Status: None   Collection Time: 07/11/23  3:46 AM   Specimen: Nasal Mucosa; Nasal Swab  Result Value Ref Range Status   SARS Coronavirus 2  by RT PCR NEGATIVE NEGATIVE Final    Comment: (NOTE) SARS-CoV-2 target nucleic acids are NOT DETECTED.  The SARS-CoV-2 RNA is generally detectable in upper respiratory specimens during the acute phase of infection. The lowest concentration of SARS-CoV-2 viral copies this assay can detect is 138 copies/mL. A negative result does not preclude SARS-Cov-2 infection and should not be used as the sole basis for treatment or other patient management decisions. A negative result may occur with  improper specimen collection/handling, submission of specimen other than nasopharyngeal swab, presence of viral mutation(s) within the areas targeted by this assay, and inadequate number of viral copies(<138 copies/mL). A negative result must be combined with clinical observations, patient history, and epidemiological information. The expected result is Negative.  Fact Sheet for Patients:  BloggerCourse.com  Fact Sheet for Healthcare Providers:  SeriousBroker.it  This test is no t yet approved or cleared by the [place] FDA and  has been authorized for detection and/or diagnosis of SARS-CoV-2 by FDA under an Emergency Use Authorization (EUA). This EUA will remain  in effect (meaning this test can be used) for the duration of the COVID-19 declaration under Section 564(b)(1) of the Act, 21 U.S.C.section 360bbb-3(b)(1), unless the authorization is terminated  or revoked sooner.       Influenza A by PCR NEGATIVE NEGATIVE Final   Influenza B by PCR NEGATIVE NEGATIVE Final    Comment: (NOTE) The Xpert Xpress SARS-CoV-2/FLU/RSV plus assay is intended as an aid in the diagnosis of influenza from Nasopharyngeal swab specimens and should not be used as a sole basis for treatment. Nasal washings and aspirates are unacceptable for Xpert Xpress SARS-CoV-2/FLU/RSV testing.  Fact Sheet for Patients: BloggerCourse.com  Fact  Sheet for Healthcare Providers: SeriousBroker.it  This test is not yet approved or cleared by the [place] FDA and has been authorized for detection and/or diagnosis of SARS-CoV-2 by FDA under an Emergency Use Authorization (EUA). This EUA will remain in effect (meaning this test can be used) for the duration of the COVID-19 declaration under Section 564(b)(1) of the Act, 21 U.S.C. section 360bbb-3(b)(1), unless the authorization is terminated or revoked.     Resp Syncytial Virus by PCR NEGATIVE NEGATIVE Final    Comment: (NOTE) Fact Sheet for Patients: BloggerCourse.com  Fact Sheet for Healthcare Providers: SeriousBroker.it  This test is not yet approved or cleared by the [place] and  has been authorized for detection and/or diagnosis of SARS-CoV-2 by FDA under an Emergency Use Authorization (EUA). This EUA will remain in effect (meaning this test can be used) for the duration of the COVID-19 declaration under Section 564(b)(1) of the Act, 21 U.S.C. section 360bbb-3(b)(1), unless the authorization is terminated or revoked.  Performed at [facility], [address], [place], [place] [address]   Respiratory (~20 pathogens) panel by PCR     Status: None   Collection Time: 07/11/23  3:46 AM   Specimen: Nasopharyngeal Swab; Respiratory  Result Value Ref Range Status   Adenovirus NOT DETECTED NOT DETECTED Final   Coronavirus 229E NOT DETECTED NOT DETECTED Final    Comment: (NOTE) The Coronavirus on the Respiratory Panel, DOES NOT test for the novel  Coronavirus (2019 nCoV)    Coronavirus HKU1 NOT DETECTED NOT DETECTED Final   Coronavirus NL63 NOT DETECTED NOT DETECTED Final   Coronavirus OC43 NOT DETECTED NOT DETECTED Final   Metapneumovirus NOT DETECTED NOT DETECTED Final   Rhinovirus / Enterovirus NOT DETECTED NOT DETECTED Final   Influenza A NOT DETECTED NOT DETECTED  Final   Influenza B NOT DETECTED NOT DETECTED Final   Parainfluenza Virus 1 NOT DETECTED NOT DETECTED Final   Parainfluenza Virus 2 NOT DETECTED NOT DETECTED Final   Parainfluenza Virus 3 NOT DETECTED NOT DETECTED Final   Parainfluenza Virus 4 NOT DETECTED NOT DETECTED Final   Respiratory Syncytial Virus NOT DETECTED NOT DETECTED Final   Bordetella pertussis NOT DETECTED NOT DETECTED Final   Bordetella Parapertussis NOT DETECTED NOT DETECTED Final   Chlamydophila pneumoniae NOT DETECTED NOT DETECTED Final   Mycoplasma pneumoniae NOT DETECTED NOT DETECTED Final    Comment: Performed at [facility], [address] [place], [place] [address]         Radiology Studies: No results found.         LOS: 4 days   Time spent= 35 mins    Miguel Rota, MD [facility]  If 7PM-7AM, please contact night-coverage  07/15/2023, 1:50 PM

## 2023-07-15 NOTE — Plan of Care (Signed)
  Problem: Nutrition: Goal: Adequate nutrition will be maintained 07/15/2023 1314 by William Dalton, RN Outcome: Progressing 07/15/2023 1314 by William Dalton, RN Outcome: Progressing   Problem: Safety: Goal: Ability to remain free from injury will improve 07/15/2023 1314 by William Dalton, RN Outcome: Progressing 07/15/2023 1314 by William Dalton, RN Outcome: Progressing   Problem: Pain Management: Goal: General experience of comfort will improve 07/15/2023 1314 by William Dalton, RN Outcome: Progressing 07/15/2023 1314 by William Dalton, RN Outcome: Progressing

## 2023-07-16 ENCOUNTER — Telehealth: Payer: Self-pay

## 2023-07-16 ENCOUNTER — Other Ambulatory Visit (HOSPITAL_COMMUNITY): Payer: Self-pay

## 2023-07-16 LAB — ASPERGILLUS ANTIGEN, BAL/SERUM: Aspergillus Ag, BAL/Serum: 0.02 {index} (ref 0.00–0.49)

## 2023-07-16 NOTE — Transitions of Care (Post Inpatient/ED Visit) (Signed)
   07/16/2023  Name: Craig Schmidt MRN: [id number] DOB: 10/30/1970  Today's TOC FU Call Status: Today's TOC FU Call Status:: Unsuccessful Call (1st Attempt) Unsuccessful Call (1st Attempt) Date: 07/16/23  Attempted to reach the patient regarding the most recent Inpatient/ED visit.  Follow Up Plan: Additional outreach attempts will be made to reach the patient to complete the Transitions of Care (Post Inpatient/ED visit) call.      Antionette Fairy, RN,BSN,CCM RN Care Manager Transitions of Care  [facility]  Direct Phone: [phone] Toll Free: [phone] Fax: [phone]

## 2023-07-16 NOTE — Discharge Summary (Signed)
Physician Discharge Summary  JAKAVION LOBIANCO WUJ:[id number] DOB: [birth year] DOA: 07/10/2023  PCP: Rema Fendt, NP  Admit date: 07/10/2023 Discharge date: 07/15/23  Admitted From: Home Disposition:  Home  Recommendations for Outpatient Follow-up:  Follow up with PCP in 1-2 weeks Please obtain BMP/CBC in one week your next doctors visit.  Homeless shelter resources given 4 weeks of p.o. Augmentin prescribed Cardiac medications adjusted as below and stop Plavix at cardiology request    Discharge Condition: Stable CODE STATUS: Full code Diet recommendation: Heart healthy  Brief/Interim Summary: Brief Narrative:  52 y.o. 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  During the hospitalization found to have multifocal pneumonia with necrotizing component and volume overload. During hospitalization echocardiogram showed EF of 20%, seen by cardiology team.  Medications were adjusted as mentioned below.  BAL performed 12/11 and bacterial workup was negative, procalcitonin was also negative.  Rest of the viral workup remained unremarkable.  Eventually is recommended the patient to remain on IV Zosyn during hospitalization and discharged on p.o. Augmentin for a month month and have him follow-up outpatient.  Assessment & Plan:  Principal Problem:   Multifocal pneumonia    Acute biventricular failure with reduced EF, 20% Moderate mitral valve stenosis History of  CAD Echo 12/5 showed EF less than 20%.  Cardiology team following.  Now on Lasix p.o. 40 mg daily, Coreg, Aldactone, Jardiance, Entresto Current recommendations Eliquis and aspirin  Polysubstance abuse - Positive for cocaine, will defer beta-blocker use in the setting of cocaine to cardiology.    History of a multifocal pneumonia with necrotizing component Status post BAL 12/11.  Bacterial workup is negative.  Procalcitonin negative.  Respiratory viral panel negative.  Previous provider discussed case with pulmonary.  Recommending continuing IV Zosyn in the hospital thereafter transition to p.o. Augmentin upon discharge     AKI stage I Background CKD stage II Baseline creatinine approximately 1.1, elevated 1.5 at admission.  Suspected cardiorenal - Diuresis per above   Substance use disorder Current smoking Counseled to quit using     History of stroke suspected cardioembolic Currently on Eliquis, aspirin, Lipitor   Suspected COPD  Continue home Incruse Ellipta, albuterol    Shoulder pain secondary to osteoarthritis X-ray of the shoulder showing osteoarthritis   DVT prophylaxis: apixaban (ELIQUIS) tablet 5 mg      Code Status: Full Code Family Communication: Friend at bedside Status is: Inpatient Remains inpatient appropriate because: Discharge today once we have safe disposition    Subjective: Seen and examined at bedside, no complaints this morning.   Examination:  General exam: Appears calm and comfortable  Respiratory system: Clear to auscultation. Respiratory effort normal. Cardiovascular system: S1 & S2 heard, RRR. No JVD, murmurs, rubs, gallops or clicks. No pedal edema. Gastrointestinal system: Abdomen is nondistended, soft and nontender. No organomegaly or masses felt. Normal bowel sounds heard. Central nervous system: Alert and oriented.  Chronic dysarthria and right upper extremity weakness Extremities: Symmetric 4 x 5 power. Skin: No rashes, lesions or  ulcers Psychiatry: Judgement and insight appear normal. Mood &  affect appropriate.    Discharge Diagnoses:  Principal Problem:   Multifocal pneumonia       Discharge Exam: Vitals:   07/15/23 1331 07/15/23 1411  BP: (!) 133/96   Pulse: 97 95  Resp: 15 (!) 24  Temp: 98.1 F (36.7 C)   SpO2: (!) 73% 100%   Vitals:   07/15/23 1116 07/15/23 1119 07/15/23 1331 07/15/23 1411  BP: 124/86 124/86 (!) 133/96   Pulse: 87 87 97 95  Resp: 16  15 (!) 24  Temp: 97.6 F (36.4 C)  98.1 F (36.7 C)   TempSrc: Oral  Oral   SpO2: 100%  (!) 73% 100%  Weight:      Height:        Discharge Instructions   Allergies as of 07/15/2023       Reactions   Shellfish Allergy Anaphylaxis   Peanut (diagnostic) Itching        Medication List     STOP taking these medications    clopidogrel 75 MG tablet Commonly known as: PLAVIX       TAKE these medications    amoxicillin-clavulanate 875-125 MG tablet Commonly known as: AUGMENTIN Take 1 tablet by mouth every 12 (twelve) hours for 28 days.   aspirin EC 81 MG tablet Take 1 tablet (81 mg total) by mouth daily. Swallow whole.   atorvastatin 40 MG tablet Commonly known as: LIPITOR Take 1 tablet (40 mg total) by mouth daily.   carvedilol 3.125 MG tablet Commonly known as: COREG Take 1 tablet (3.125 mg total) by mouth 2 (two) times daily.   Eliquis 5 MG Tabs tablet Generic drug: apixaban Take 1 tablet (5 mg total) by mouth 2 (two) times daily.   furosemide 40 MG tablet Commonly known as: LASIX Take 1 tablet (40 mg total) by mouth daily. What changed: how much to take   Incruse Ellipta 62.5 MCG/ACT Aepb Generic drug: umeclidinium bromide Inhale 1 puff into the lungs daily.   Jardiance 10 MG Tabs tablet Generic drug: empagliflozin Take 1 tablet (10 mg total) by mouth daily.   nicotine polacrilex 2 MG gum Commonly known as: NICORETTE Take 1 each (2 mg total) by mouth as needed for smoking cessation.    oxyCODONE-acetaminophen 5-325 MG tablet Commonly known as: Percocet Take 1 tablet by mouth every 6 (six) hours as needed.   sacubitril-valsartan 24-26 MG Commonly known as: ENTRESTO Take 1 tablet by mouth 2 (two) times daily.   spironolactone 25 MG tablet Commonly known as: ALDACTONE Take 0.5 tablets (12.5 mg total) by mouth daily.        Follow-up Information     Rema Fendt, NP Follow up in 1 week(s).   Specialty: Nurse Practitioner Contact information: [address] [place] [address] [phone]                Allergies  Allergen Reactions   Shellfish Allergy Anaphylaxis   Peanut (Diagnostic) Itching    You were cared for by a hospitalist during your hospital stay. If you have any questions about your discharge medications or the care you received while you were in the hospital after you are discharged, you can call the unit and asked to speak with the hospitalist on call if the hospitalist that took care of you is not available. Once you are discharged, your primary care physician will handle any further medical issues. Please note that no refills for any discharge medications will be authorized once you are discharged, as  it is imperative that you return to your primary care physician (or establish a relationship with a primary care physician if you do not have one) for your aftercare needs so that they can reassess your need for medications and monitor your lab values.  You were cared for by a hospitalist during your hospital stay. If you have any questions about your discharge medications or the care you received while you were in the hospital after you are discharged, you can call the unit and asked to speak with the hospitalist on call if the hospitalist that took care of you is not available. Once you are discharged, your primary care physician will handle any further medical issues. Please note that NO REFILLS for any discharge medications  will be authorized once you are discharged, as it is imperative that you return to your primary care physician (or establish a relationship with a primary care physician if you do not have one) for your aftercare needs so that they can reassess your need for medications and monitor your lab values.  Please request your Prim.MD to go over all Hospital Tests and Procedure/Radiological results at the follow up, please get all Hospital records sent to your Prim MD by signing hospital release before you go home.  Get CBC, CMP, 2 view Chest X ray checked  by Primary MD during your next visit or SNF MD in 5-7 days ( we routinely change or add medications that can affect your baseline labs and fluid status, therefore we recommend that you get the mentioned basic workup next visit with your PCP, your PCP may decide not to get them or add new tests based on their clinical decision)  On your next visit with your primary care physician please Get Medicines reviewed and adjusted.  If you experience worsening of your admission symptoms, develop shortness of breath, life threatening emergency, suicidal or homicidal thoughts you must seek medical attention immediately by calling 911 or calling your MD immediately  if symptoms less severe.  You Must read complete instructions/literature along with all the possible adverse reactions/side effects for all the Medicines you take and that have been prescribed to you. Take any new Medicines after you have completely understood and accpet all the possible adverse reactions/side effects.   Do not drive, operate heavy machinery, perform activities at heights, swimming or participation in water activities or provide baby sitting services if your were admitted for syncope or siezures until you have seen by Primary MD or a Neurologist and advised to do so again.  Do not drive when taking Pain medications.   Procedures/Studies: DG Shoulder Right Result Date:  07/13/2023 CLINICAL DATA:  Right shoulder/neck pain. EXAM: RIGHT SHOULDER - 2+ VIEW COMPARISON:  Chest radiograph dated 07/11/2023. FINDINGS: There is no evidence of fracture or dislocation. There is mild acromioclavicular joint osteoarthritis. Soft tissues are unremarkable. IMPRESSION: Mild acromioclavicular joint osteoarthritis. Electronically Signed   By: Romona Curls M.D.   On: 07/13/2023 15:20   CT CHEST WO CONTRAST Result Date: 07/11/2023 CLINICAL DATA:  Focal pneumonia with necrotizing component. Recent Val EXAM: CT CHEST WITHOUT CONTRAST TECHNIQUE: Multidetector CT imaging of the chest was performed following the standard protocol without IV contrast. RADIATION DOSE REDUCTION: This exam was performed according to the departmental dose-optimization program which includes automated exposure control, adjustment of the mA and/or kV according to patient size and/or use of iterative reconstruction technique. COMPARISON:  Chest CT 06/26/2023 FINDINGS: Cardiovascular: Cardiac enlargement. No pericardial effusion. No acute vascular finding without  contrast. Mediastinum/Nodes: Thickening of mediastinal lymph nodes similar to prior and likely reactive given the pulmonary findings and history. No interval change Lungs/Pleura: There are areas of peripheral consolidative opacity in the bilateral lungs without progression. History of necrotizing component, with minimal superimposed lucency at the consolidation in the posterior right and lateral left lower lobes. There is diffuse airway cuffing, interlobular septal thickening, fissure thickening, and trace pleural fluid. Some mosaic attenuation of the upper lungs likely related to small airways disease and the airway thickening. 8 mm nodule in the right upper lobe on 2:20, not significantly changed since 2016 chest CT, consistent with benign process. Upper Abdomen: No acute finding Musculoskeletal: No acute finding IMPRESSION: Similar degree of patchy bilateral  pneumonia with subtle cavitary features. CHF. Electronically Signed   By: Tiburcio Pea M.D.   On: 07/11/2023 06:17   DG Chest Port 1 View Result Date: 07/11/2023 CLINICAL DATA:  Weakness and cough EXAM: PORTABLE CHEST 1 VIEW COMPARISON:  Chest x-ray 07/10/2023.  Chest CT 06/26/2023. FINDINGS: Patchy left lower lobe airspace disease has slightly increased. Focal slightly nodular airspace disease in the right lung base has slightly increased. Heart is mildly enlarged. There is no pleural effusion or pneumothorax. No acute fractures are seen. IMPRESSION: Increase in patchy left lower lobe airspace disease and increase in slightly nodular airspace disease in the right lung base. Findings are concerning for pneumonia. Follow-up chest x-ray recommended in 4-6 weeks to ensure resolution. Electronically Signed   By: Darliss Cheney M.D.   On: 07/11/2023 03:05   DG Chest Port 1 View Result Date: 07/10/2023 CLINICAL DATA:  Chest pain EXAM: PORTABLE CHEST 1 VIEW COMPARISON:  06/26/2023 FINDINGS: Chronic cardiomegaly. Hazy opacity at the right base, airspace opacity adjacent to the pleura by recent chest CT. Improved aeration in the left mid lung. Trace bilateral pleural fluid. No pneumothorax. IMPRESSION: 1. Stable to improved opacities in the lower lungs seen on chest CT 06/26/2023. Recommend continued follow-up to clearing. 2. Chronic cardiomegaly. Electronically Signed   By: Tiburcio Pea M.D.   On: 07/10/2023 07:47   DG Swallowing Func-Speech Pathology Result Date: 06/27/2023 Table formatting from the original result was not included. Modified Barium Swallow Study Patient Details Name: KARIM MCCOIG MRN: [id number] Date of Birth: [birth year] Today's Date: 06/27/2023 HPI/PMH: HPI: 52 year old homeless man with addiction to cigarettes and crack cocaine presenting with >1 month dry cough, chills, pleurisy with background of 6 months unintentional weight loss.  Per chart pt has been to the hospital 5 times for  this issue since 05/25/23. CXR Mild bibasilar infiltrates. PMH: stroke 2023, CAD, CKD, brain tumor, ETOH abuse. BSE 11/30/22 no signs of aspiration in reclined position due to pain, denied dysphagia and unlikely to suffer from silent aspiration and denied reflux or regurgitation. Recommended to continue regular diet, thin liquids. MBS to assess oropharyngeal swallow given multiple admissions for pna over past year, history of stroke and pharyngeal globus sensation. Clinical Impression: Clinical Impression: Pt demonstrated a normal oropharyngeal sequence with adequate oral control, bolus cohesion, mastication and complete oral clearance. His laryngeal ROM and strength were within normal range for hyolaryngeal excursion, epiglottic inversion and laryngeal closure which prevented penetration and aspiration. Swallow integrity was challenged with multiple/sequential cup sips with thin and use of straw. Pt reported pharyngeal globus sensation although there was no pharyngeal residue observed and scan of esophagus revealed complete clearance. Pt has history of ETOH abuse and therapist suspects he may have occasional esophageal irritation or undiagnosed involvement contributing  to pharyngeal globus sensation. Recommend he continue regular texture, thin liquids, pills with thin and no further ST is needed at this time. Factors that may increase risk of adverse event in presence of aspiration Rubye Oaks & Clearance Coots 2021): No data recorded Recommendations/Plan: Swallowing Evaluation Recommendations Swallowing Evaluation Recommendations Recommendations: PO diet PO Diet Recommendation: Regular; Thin liquids (Level 0) Liquid Administration via: Cup; Straw Medication Administration: Whole meds with liquid Supervision: Staff to assist with self-feeding; Other (comment) (limited use of right hand after stroke) Postural changes: Position pt fully upright for meals Oral care recommendations: Oral care BID (2x/day) Treatment Plan Treatment  Plan Treatment recommendations: No treatment recommended at this time Follow-up recommendations: No SLP follow up Functional status assessment: Patient has not had a recent decline in their functional status. Recommendations Recommendations for follow up therapy are one component of a multi-disciplinary discharge planning process, led by the attending physician.  Recommendations may be updated based on patient status, additional functional criteria and insurance authorization. Assessment: Orofacial Exam: Orofacial Exam Oral Cavity: Oral Hygiene: WFL Oral Cavity - Dentition: Other (Comment) (missing 1-2 upper but majority intact) Orofacial Anatomy: WFL Oral Motor/Sensory Function: WFL Anatomy: Anatomy: WFL Boluses Administered: Boluses Administered Boluses Administered: Thin liquids (Level 0); Mildly thick liquids (Level 2, nectar thick); Moderately thick liquids (Level 3, honey thick); Puree; Solid  Oral Impairment Domain: Oral Impairment Domain Lip Closure: No labial escape Tongue control during bolus hold: Cohesive bolus between tongue to palatal seal Bolus preparation/mastication: Timely and efficient chewing and mashing Bolus transport/lingual motion: Brisk tongue motion Oral residue: Complete oral clearance Location of oral residue : N/A Initiation of pharyngeal swallow : Valleculae  Pharyngeal Impairment Domain: Pharyngeal Impairment Domain Soft palate elevation: No bolus between soft palate (SP)/pharyngeal wall (PW) Laryngeal elevation: Complete superior movement of thyroid cartilage with complete approximation of arytenoids to epiglottic petiole Anterior hyoid excursion: Complete anterior movement Epiglottic movement: Complete inversion Laryngeal vestibule closure: Complete, no air/contrast in laryngeal vestibule Pharyngeal stripping wave : Present - complete Pharyngeal contraction (A/P view only): N/A Pharyngoesophageal segment opening: Complete distension and complete duration, no obstruction of flow  Tongue base retraction: No contrast between tongue base and posterior pharyngeal wall (PPW) Pharyngeal residue: Complete pharyngeal clearance Location of pharyngeal residue: N/A  Esophageal Impairment Domain: Esophageal Impairment Domain Esophageal clearance upright position: Complete clearance, esophageal coating Pill: No data recorded Penetration/Aspiration Scale Score: Penetration/Aspiration Scale Score 1.  Material does not enter airway: Thin liquids (Level 0); Mildly thick liquids (Level 2, nectar thick); Moderately thick liquids (Level 3, honey thick); Puree; Solid Compensatory Strategies: Compensatory Strategies Compensatory strategies: -- (N/A)   General Information: Caregiver present: No  Diet Prior to this Study: Regular; Thin liquids (Level 0)   Temperature : Normal   Respiratory Status: WFL   Supplemental O2: None (Room air)   History of Recent Intubation: No  Behavior/Cognition: Alert; Cooperative; Pleasant mood Self-Feeding Abilities: Able to self-feed; Other (Comment) (held cup with left hand) Baseline vocal quality/speech: Normal Volitional Cough: Able to elicit Volitional Swallow: Able to elicit Exam Limitations: No limitations Goal Planning: No data recorded No data recorded No data recorded No data recorded Consulted and agree with results and recommendations: Patient; Nurse; Physician Pain: Pain Assessment Pain Assessment: Faces Faces Pain Scale: 0 Breathing: 0 Negative Vocalization: 0 Facial Expression: 0 Body Language: 0 Consolability: 0 PAINAD Score: 0 Pain Location: "around ribs when he coughs" End of Session: Start Time:SLP Start Time (ACUTE ONLY): 1443 Stop Time: SLP Stop Time (ACUTE ONLY): 1455 Time Calculation:SLP Time  Calculation (min) (ACUTE ONLY): 12 min Charges: SLP Evaluations $ SLP Speech Visit: 1 Visit SLP Evaluations $BSS Swallow: 1 Procedure $MBS Swallow: 1 Procedure SLP visit diagnosis: SLP Visit Diagnosis: Dysphagia, unspecified (R13.10) Past Medical History: Past Medical  History: Diagnosis Date  Alcohol abuse   Asthma   Brain tumor (HCC)   CAD (coronary artery disease)   Chronic HFrEF (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)   Chronic kidney disease, stage 3 (HCC)   Cocaine abuse (HCC)   History of medication noncompliance   Homelessness   MI (myocardial infarction) (HCC)   STEMI (ST elevation myocardial infarction) (HCC)   Stroke [facility])  Past Surgical History: Past Surgical History: Procedure Laterality Date  BUBBLE STUDY  09/11/2021  Procedure: BUBBLE STUDY;  Surgeon: Pricilla Riffle, MD;  Location: [facility] ENDOSCOPY;  Service: Cardiovascular;;  LEFT HEART CATH AND CORONARY ANGIOGRAPHY N/A 12/19/2021  Procedure: LEFT HEART CATH AND CORONARY ANGIOGRAPHY;  Surgeon: Lyn Records, MD;  Location: [facility] INVASIVE CV LAB;  Service: Cardiovascular;  Laterality: N/A;  RIGHT/LEFT HEART CATH AND CORONARY ANGIOGRAPHY N/A 04/13/2022  Procedure: RIGHT/LEFT HEART CATH AND CORONARY ANGIOGRAPHY;  Surgeon: Laurey Morale, MD;  Location: [facility] INVASIVE CV LAB;  Service: Cardiovascular;  Laterality: N/A;  TEE WITHOUT CARDIOVERSION N/A 09/11/2021  Procedure: TRANSESOPHAGEAL ECHOCARDIOGRAM (TEE);  Surgeon: Pricilla Riffle, MD;  Location: [facility] ENDOSCOPY;  Service: Cardiovascular;  Laterality: N/A; Royce Macadamia 06/27/2023, 3:32 PM  ECHOCARDIOGRAM COMPLETE Result Date: 06/27/2023    ECHOCARDIOGRAM REPORT   Patient Name:   Craig Schmidt Date of Exam: 06/27/2023 Medical Rec #:  [id number]          Height:       67.0 in Accession #:    [id number]         Weight:       137.3 lb Date of Birth:  10-04-1970           BSA:          1.724 m Patient Age:    52 years           BP:           130/93 mmHg Patient Gender: M                  HR:           93 bpm. Exam Location:  Inpatient Procedure: 2D Echo, Cardiac Doppler and Color Doppler Indications:    Endocarditits I38  History:        Patient has prior history of Echocardiogram examinations, most                 recent 05/27/2023. CHF and Cardiomyopathy, CAD  and Acute MI,                 Stroke; Risk Factors:Hypertension, Current Smoker and                 Dyslipidemia.  Sonographer:    Lucendia Herrlich RCS Referring Phys: [id number] Vinnie Level SMITH IMPRESSIONS  1. Left ventricular ejection fraction, by estimation, is <20%. The left ventricle has severely decreased function. The left ventricle demonstrates global hypokinesis. The left ventricular internal cavity size was mildly to moderately dilated. Left ventricular diastolic parameters are consistent with Grade II diastolic dysfunction (pseudonormalization).  2. Right ventricular systolic function is moderately reduced. The right ventricular size is mildly enlarged.  3. Left atrial size was severely dilated.  4. Right atrial size was mildly dilated.  5. The mitral valve is grossly normal. There is mild thickening of the mitral valve with restricted excursion of valve leaflets. Moderate mitral stenosis.  6. The aortic valve is normal in structure. Aortic valve regurgitation is trivial.  7. No overt signs of endocarditis.  8. The inferior vena cava is normal in size with greater than 50% respiratory variability, suggesting right atrial pressure of 3 mmHg. FINDINGS  Left Ventricle: Left ventricular ejection fraction, by estimation, is <20%. The left ventricle has severely decreased function. The left ventricle demonstrates global hypokinesis. The left ventricular internal cavity size was mildly to moderately dilated. There is no left ventricular hypertrophy. Left ventricular diastolic parameters are consistent with Grade II diastolic dysfunction (pseudonormalization). Right Ventricle: The right ventricular size is mildly enlarged. No increase in right ventricular wall thickness. Right ventricular systolic function is moderately reduced. Left Atrium: Left atrial size was severely dilated. Right Atrium: Right atrial size was mildly dilated. Pericardium: There is no evidence of pericardial effusion. Mitral Valve: The mitral  valve is normal in structure. There is mild thickening of the mitral valve leaflet(s). Mild mitral valve regurgitation. Tricuspid Valve: The tricuspid valve is normal in structure. Tricuspid valve regurgitation is trivial. Aortic Valve: The aortic valve is normal in structure. Aortic valve regurgitation is trivial. Aortic valve peak gradient measures 4.8 mmHg. Pulmonic Valve: The pulmonic valve was normal in structure. Pulmonic valve regurgitation is trivial. Aorta: The aortic root and ascending aorta are structurally normal, with no evidence of dilitation. Venous: The inferior vena cava is normal in size with greater than 50% respiratory variability, suggesting right atrial pressure of 3 mmHg. IAS/Shunts: No atrial level shunt detected by color flow Doppler.  LEFT VENTRICLE PLAX 2D LVIDd:         5.70 cm      Diastology LVIDs:         5.10 cm      LV e' medial:    5.27 cm/s LV PW:         0.90 cm      LV E/e' medial:  31.7 LV IVS:        0.70 cm      LV e' lateral:   5.27 cm/s LVOT diam:     2.00 cm      LV E/e' lateral: 31.7 LV SV:         35 LV SV Index:   20 LVOT Area:     3.14 cm                              3D Volume EF: LV Volumes (MOD)            3D EF:        24 % LV vol d, MOD A2C: 190.0 ml LV EDV:       251 ml LV vol d, MOD A4C: 181.0 ml LV ESV:       190 ml LV vol s, MOD A2C: 159.0 ml LV SV:        61 ml LV vol s, MOD A4C: 141.0 ml LV SV MOD A2C:     31.0 ml LV SV MOD A4C:     181.0 ml LV SV MOD BP:      36.9 ml RIGHT VENTRICLE            IVC RV S prime:     7.23 cm/s  IVC diam: 1.80 cm TAPSE (M-mode): 1.2 cm  LEFT ATRIUM             Index        RIGHT ATRIUM           Index LA diam:        4.90 cm 2.84 cm/m   RA Area:     16.10 cm LA Vol (A2C):   82.1 ml 47.63 ml/m  RA Volume:   43.90 ml  25.47 ml/m LA Vol (A4C):   86.1 ml 49.95 ml/m LA Biplane Vol: 92.9 ml 53.89 ml/m  AORTIC VALVE                 PULMONIC VALVE AV Area (Vmax): 2.16 cm     PR End Diast Vel: 8.07 msec AV Vmax:        110.00 cm/s  AV Peak Grad:   4.8 mmHg LVOT Vmax:      75.50 cm/s LVOT Vmean:     47.000 cm/s LVOT VTI:       0.112 m  AORTA Ao Root diam: 3.10 cm Ao Asc diam:  2.50 cm MITRAL VALVE                TRICUSPID VALVE MV Area (PHT): 4.29 cm     TR Peak grad:   45.2 mmHg MV Decel Time: 177 msec     TR Vmax:        336.00 cm/s MR Peak grad: 101.6 mmHg MR Vmax:      504.00 cm/s   SHUNTS MV E velocity: 167.00 cm/s  Systemic VTI:  0.11 m MV A velocity: 119.00 cm/s  Systemic Diam: 2.00 cm MV E/A ratio:  1.40 Aditya Sabharwal Electronically signed by Dorthula Nettles Signature Date/Time: 06/27/2023/1:34:57 PM    Final    CT Angio Chest PE W/Cm &/Or Wo Cm Result Date: 06/27/2023 CLINICAL DATA:  Shortness of breath. EXAM: CT ANGIOGRAPHY CHEST WITH CONTRAST TECHNIQUE: Multidetector CT imaging of the chest was performed using the standard protocol during bolus administration of intravenous contrast. Multiplanar CT image reconstructions and MIPs were obtained to evaluate the vascular anatomy. RADIATION DOSE REDUCTION: This exam was performed according to the departmental dose-optimization program which includes automated exposure control, adjustment of the mA and/or kV according to patient size and/or use of iterative reconstruction technique. CONTRAST:  80mL OMNIPAQUE IOHEXOL 350 MG/ML SOLN COMPARISON:  June 19, 2023 FINDINGS: Cardiovascular: The thoracic aorta is normal in appearance. Satisfactory opacification of the pulmonary arteries to the segmental level. No evidence of pulmonary embolism. There is stable moderate severity cardiomegaly. No pericardial effusion. Mediastinum/Nodes: No enlarged mediastinal, hilar, or axillary lymph nodes. Thyroid gland, trachea, and esophagus demonstrate no significant findings. Lungs/Pleura: A stable 8 mm noncalcified lung nodule is seen within the anterolateral aspect of the right upper lobe (axial CT image 52, CT series 12). Stable ground-glass appearing areas of lung parenchyma are seen  bilaterally. Mild lingular and mild anterolateral right middle lobe atelectasis and/or infiltrate is seen. Stable moderate severity areas of consolidation are noted within the anterolateral aspects of the bilateral lower lobes and posteromedial aspect of the right lower lobe. No pleural effusion or pneumothorax is identified. Upper Abdomen: No acute abnormality. Musculoskeletal: No chest wall abnormality. No acute or significant osseous findings. Review of the MIP images confirms the above findings. IMPRESSION: 1. No evidence of pulmonary embolism. 2. Stable 8 mm noncalcified right upper lobe lung nodule. Non-contrast chest CT at 6-12 months is recommended. If the nodule is stable at time of repeat CT, then  future CT at 18-24 months (from today's scan) is considered optional for low-risk patients, but is recommended for high-risk patients. This recommendation follows the consensus statement: Guidelines for Management of Incidental Pulmonary Nodules Detected on CT Images: From the Fleischner Society 2017; Radiology 2017; 284:228-243. 3. Stable moderate severity areas of consolidation within the bilateral lower lobes and right middle lobe, likely infectious in origin. Follow-up to resolution is recommended to exclude the presence of an underlying neoplastic process. 4. Stable ground-glass appearing areas of lung parenchyma bilaterally which may represent sequelae associated with pulmonary edema or and inflammatory process such as pneumonitis. Electronically Signed   By: Aram Candela M.D.   On: 06/27/2023 00:14   DG Chest 2 View Result Date: 06/26/2023 CLINICAL DATA:  Chest pain and shortness of breath. EXAM: CHEST - 2 VIEW COMPARISON:  June 19, 2023 FINDINGS: The cardiac silhouette is mildly enlarged and unchanged in size. Mild, predominant stable patchy infiltrates are seen within the bilateral lung bases. No pleural effusion or pneumothorax is identified. The visualized skeletal structures are  unremarkable. IMPRESSION: Mild bibasilar infiltrates. Electronically Signed   By: Aram Candela M.D.   On: 06/26/2023 21:22   CT Angio Chest PE W and/or Wo Contrast Result Date: 06/19/2023 CLINICAL DATA:  Pulmonary embolism suspected. High probability, with shortness of breath. EXAM: CT ANGIOGRAPHY CHEST WITH CONTRAST TECHNIQUE: Multidetector CT imaging of the chest was performed using the standard protocol during bolus administration of intravenous contrast. Multiplanar CT image reconstructions and MIPs were obtained to evaluate the vascular anatomy. RADIATION DOSE REDUCTION: This exam was performed according to the departmental dose-optimization program which includes automated exposure control, adjustment of the mA and/or kV according to patient size and/or use of iterative reconstruction technique. CONTRAST:  75mL OMNIPAQUE IOHEXOL 350 MG/ML SOLN COMPARISON:  AP Lat chest today, CTA chest 06/17/2023, CTA chest 06/04/2023. FINDINGS: Cardiovascular: There is moderate panchamber cardiomegaly with minimal pericardial effusion again noted. Once again there is IVC and hepatic vein contrast reflux most likely indicating ongoing right heart dysfunction versus tricuspid regurgitation. The pulmonary trunk is slightly prominent, as before, without evidence of arterial embolism. The aorta and great vessels are unremarkable. There are no visible coronary calcifications. Central pulmonary veins are distended but no more than previously. Mediastinum/Nodes: Subcarinal nodal mass again measures 2.3 cm in short axis. Less enlarged lymph nodes at the AP window up to 1.2 cm in short axis. There scattered slightly prominent hilar lymph nodes bilaterally. The thyroid gland, thoracic trachea, thoracic esophagus are unremarkable. No new or progressive adenopathy.  Axillary spaces are clear. Lungs/Pleura: Persistent diffuse bronchial thickening. Similar appearance interlobular septal thickening in the lung bases consistent with  mild interstitial edema. Small right and minimal left layering pleural effusions are similar. Consolidation continues to be noted in the anterior and posterior basal segments of the right lower lobe and lateral basal segment of the left lower lobe. The left lower lobe opacity appears more hypodense which suggest liquefaction necrosis. There is improved atelectasis in the lingular base. Persisting mosaicism of the lung fields which is either due to small airways disease with air trapping or ground-glass edema. There are paraseptal emphysematous changes in the upper lobes. Scattered linear scarring or atelectasis in the bases. There is no new infiltrate. Upper Abdomen: No acute abnormality.  Hepatic steatosis. Musculoskeletal: There is thoracic kyphodextroscoliosis and mild degenerative changes. No acute or significant osseous findings are otherwise seen. Review of the MIP images confirms the above findings. IMPRESSION: 1. No evidence of arterial embolism. Slight  prominence of the pulmonary trunk. 2. Cardiomegaly with minimal pericardial effusion, IVC and hepatic vein contrast reflux the latter most likely indicating ongoing right heart dysfunction versus tricuspid regurgitation. 3. Persisting interlobular septal thickening in the lung bases consistent with mild interstitial edema. 4. Small right and minimal left layering pleural effusions are similar. 5. Consolidation in the anterior and posterior basal segments of the right lower lobe and lateral basal segment of the left lower lobe. The left lower lobe opacity appears more hypodense which suggests liquefaction necrosis. 6. Persisting mosaicism of the lung fields which is either due to small airways disease with air trapping or ground-glass edema. 7. Emphysema. Redemonstrated bronchial thickening which could be bronchitis or congestive. 8. Stable mediastinal and hilar adenopathy. 9. Hepatic steatosis. Emphysema (ICD10-J43.9). Electronically Signed   By: Almira Bar M.D.   On: 06/19/2023 21:13   DG Chest 2 View Result Date: 06/19/2023 CLINICAL DATA:  Chest pain and weakness. EXAM: CHEST - 2 VIEW COMPARISON:  06/16/2023 FINDINGS: Moderate cardiomegaly again demonstrated. Tiny bilateral pleural effusions are noted. Increased airspace opacity seen in the right lower lobe, suspicious for pneumonia. IMPRESSION: Increased right lower lobe airspace opacity, suspicious for pneumonia. Tiny bilateral pleural effusions. Stable cardiomegaly. Electronically Signed   By: Danae Orleans M.D.   On: 06/19/2023 16:53   CT Angio Chest/Abd/Pel for Dissection W and/or W/WO Result Date: 06/17/2023 CLINICAL DATA:  Acute aortic syndrome suspected. Low back pain and cough. Pain with breathing. EXAM: CT ANGIOGRAPHY CHEST, ABDOMEN AND PELVIS TECHNIQUE: Non-contrast CT of the chest was initially obtained. Multidetector CT imaging through the chest, abdomen and pelvis was performed using the standard protocol during bolus administration of intravenous contrast. Multiplanar reconstructed images and MIPs were obtained and reviewed to evaluate the vascular anatomy. RADIATION DOSE REDUCTION: This exam was performed according to the departmental dose-optimization program which includes automated exposure control, adjustment of the mA and/or kV according to patient size and/or use of iterative reconstruction technique. CONTRAST:  OMNIPAQUE IOHEXOL 350 MG/ML SOLN COMPARISON:  06/12/2023. FINDINGS: CTA CHEST FINDINGS Cardiovascular: The heart is enlarged and there is a trace pericardial effusion. The aorta and pulmonary trunk are normal in caliber. No evidence of dissection is seen. The great vessels appear patent. Mediastinum/Nodes: Enlarged lymph nodes are present in the mediastinum measuring 1 cm at the AP window and 2.3 cm in the subcarinal space, likely reactive. No hilar or axillary lymphadenopathy is seen. The thyroid gland, trachea, and esophagus are within normal limits. Lungs/Pleura:  Last hazy ground-glass attenuation is noted in the lungs bilaterally, slightly improved from the prior exam. There is bronchial wall thickening bilaterally with strandy opacities at the lung bases. Consolidation is noted in the anterior aspect and posterior portion of the right lower lobe and lateral aspect of the left lower lobe, improved from the prior exam. There is a small right pleural effusion and a trace left pleural effusion. No pneumothorax is seen. Musculoskeletal: Degenerative changes are present in the thoracic spine. No acute osseous abnormality. Review of the MIP images confirms the above findings. CTA ABDOMEN AND PELVIS FINDINGS VASCULAR Aorta: Normal caliber aorta without aneurysm, dissection, vasculitis or significant stenosis. Aortic atherosclerosis. Celiac: Patent without evidence of aneurysm, dissection, vasculitis or significant stenosis. SMA: Patent without evidence of aneurysm, dissection, vasculitis or significant stenosis. Renals: Both renal arteries are patent without evidence of aneurysm, dissection, vasculitis, fibromuscular dysplasia or significant stenosis. IMA: Patent. Inflow: Patent without evidence of aneurysm, dissection, vasculitis or significant stenosis. Veins: No obvious venous abnormality  within the limitations of this arterial phase study. Review of the MIP images confirms the above findings. NON-VASCULAR Hepatobiliary: No focal liver abnormality is seen. No gallstones, gallbladder wall thickening, or biliary dilatation. Pancreas: Unremarkable. No pancreatic ductal dilatation or surrounding inflammatory changes. Spleen: Normal in size without focal abnormality. Adrenals/Urinary Tract: The adrenal glands are within normal limits. The kidneys enhance symmetrically. No renal calculus or hydronephrosis. The bladder is unremarkable. Stomach/Bowel: Stomach is within normal limits. Appendix is not seen. No evidence of bowel wall thickening, distention, or inflammatory changes. No  free air or pneumatosis. A moderate amount of retained stool is present in the colon. Lymphatic: No abdominal or pelvic lymphadenopathy. Reproductive: Prostate is unremarkable. Other: No abdominopelvic ascites. Musculoskeletal: A pars defect is noted at L5 on the left. Mild degenerative changes are noted in the lumbar spine. No acute osseous abnormality. Review of the MIP images confirms the above findings. IMPRESSION: 1. Aortic atherosclerosis without evidence of aneurysm or dissection. 2. Ground-glass attenuation in the lungs, bronchial wall thickening, strandy airspace opacities, and bilateral lower lobe consolidation, improved from the previous exam. 3. Small bilateral pleural effusions. 4. Moderate amount of retained stool in the colon suggesting constipation. 5. Cardiomegaly. Electronically Signed   By: Thornell Sartorius M.D.   On: 06/17/2023 02:30   DG Chest Port 1 View Result Date: 06/16/2023 CLINICAL DATA:  Chest pain short of breath EXAM: PORTABLE CHEST 1 VIEW COMPARISON:  06/12/2023, CT 06/04/2023 FINDINGS: Cardiomegaly with small right pleural effusion. Mild right peripheral lung base airspace opacity. No pneumothorax. IMPRESSION: Cardiomegaly with small right pleural effusion. Mild right peripheral lung base airspace opacity/pneumonia without significant change. Electronically Signed   By: Jasmine Pang M.D.   On: 06/16/2023 23:56     The results of significant diagnostics from this hospitalization (including imaging, microbiology, ancillary and laboratory) are listed below for reference.     Microbiology: Recent Results (from the past 240 hours)  MRSA Next Gen by PCR, Nasal     Status: None   Collection Time: 07/11/23  3:39 AM   Specimen: Nasal Mucosa; Nasal Swab  Result Value Ref Range Status   MRSA by PCR Next Gen NOT DETECTED NOT DETECTED Final    Comment: (NOTE) The GeneXpert MRSA Assay (FDA approved for NASAL specimens only), is one component of a comprehensive MRSA colonization  surveillance program. It is not intended to diagnose MRSA infection nor to guide or monitor treatment for MRSA infections. Test performance is not FDA approved in patients less than 58 years old. Performed at [facility], [address] [place], [place] [address]   Resp panel by RT-PCR (RSV, Flu A&B, Covid) Nasal Mucosa     Status: None   Collection Time: 07/11/23  3:46 AM   Specimen: Nasal Mucosa; Nasal Swab  Result Value Ref Range Status   SARS Coronavirus 2 by RT PCR NEGATIVE NEGATIVE Final    Comment: (NOTE) SARS-CoV-2 target nucleic acids are NOT DETECTED.  The SARS-CoV-2 RNA is generally detectable in upper respiratory specimens during the acute phase of infection. The lowest concentration of SARS-CoV-2 viral copies this assay can detect is 138 copies/mL. A negative result does not preclude SARS-Cov-2 infection and should not be used as the sole basis for treatment or other patient management decisions. A negative result may occur with  improper specimen collection/handling, submission of specimen other than nasopharyngeal swab, presence of viral mutation(s) within the areas targeted by this assay, and inadequate number of viral copies(<138 copies/mL). A negative result  must be combined with clinical observations, patient history, and epidemiological information. The expected result is Negative.  Fact Sheet for Patients:  BloggerCourse.com  Fact Sheet for Healthcare Providers:  SeriousBroker.it  This test is no t yet approved or cleared by the [place] FDA and  has been authorized for detection and/or diagnosis of SARS-CoV-2 by FDA under an Emergency Use Authorization (EUA). This EUA will remain  in effect (meaning this test can be used) for the duration of the COVID-19 declaration under Section 564(b)(1) of the Act, 21 U.S.C.section 360bbb-3(b)(1), unless the authorization is terminated  or  revoked sooner.       Influenza A by PCR NEGATIVE NEGATIVE Final   Influenza B by PCR NEGATIVE NEGATIVE Final    Comment: (NOTE) The Xpert Xpress SARS-CoV-2/FLU/RSV plus assay is intended as an aid in the diagnosis of influenza from Nasopharyngeal swab specimens and should not be used as a sole basis for treatment. Nasal washings and aspirates are unacceptable for Xpert Xpress SARS-CoV-2/FLU/RSV testing.  Fact Sheet for Patients: BloggerCourse.com  Fact Sheet for Healthcare Providers: SeriousBroker.it  This test is not yet approved or cleared by the [place] FDA and has been authorized for detection and/or diagnosis of SARS-CoV-2 by FDA under an Emergency Use Authorization (EUA). This EUA will remain in effect (meaning this test can be used) for the duration of the COVID-19 declaration under Section 564(b)(1) of the Act, 21 U.S.C. section 360bbb-3(b)(1), unless the authorization is terminated or revoked.     Resp Syncytial Virus by PCR NEGATIVE NEGATIVE Final    Comment: (NOTE) Fact Sheet for Patients: BloggerCourse.com  Fact Sheet for Healthcare Providers: SeriousBroker.it  This test is not yet approved or cleared by the [place] FDA and has been authorized for detection and/or diagnosis of SARS-CoV-2 by FDA under an Emergency Use Authorization (EUA). This EUA will remain in effect (meaning this test can be used) for the duration of the COVID-19 declaration under Section 564(b)(1) of the Act, 21 U.S.C. section 360bbb-3(b)(1), unless the authorization is terminated or revoked.  Performed at [facility], [address], [place], [place] [address]   Respiratory (~20 pathogens) panel by PCR     Status: None   Collection Time: 07/11/23  3:46 AM   Specimen: Nasopharyngeal Swab; Respiratory  Result Value Ref Range Status   Adenovirus NOT DETECTED  NOT DETECTED Final   Coronavirus 229E NOT DETECTED NOT DETECTED Final    Comment: (NOTE) The Coronavirus on the Respiratory Panel, DOES NOT test for the novel  Coronavirus (2019 nCoV)    Coronavirus HKU1 NOT DETECTED NOT DETECTED Final   Coronavirus NL63 NOT DETECTED NOT DETECTED Final   Coronavirus OC43 NOT DETECTED NOT DETECTED Final   Metapneumovirus NOT DETECTED NOT DETECTED Final   Rhinovirus / Enterovirus NOT DETECTED NOT DETECTED Final   Influenza A NOT DETECTED NOT DETECTED Final   Influenza B NOT DETECTED NOT DETECTED Final   Parainfluenza Virus 1 NOT DETECTED NOT DETECTED Final   Parainfluenza Virus 2 NOT DETECTED NOT DETECTED Final   Parainfluenza Virus 3 NOT DETECTED NOT DETECTED Final   Parainfluenza Virus 4 NOT DETECTED NOT DETECTED Final   Respiratory Syncytial Virus NOT DETECTED NOT DETECTED Final   Bordetella pertussis NOT DETECTED NOT DETECTED Final   Bordetella Parapertussis NOT DETECTED NOT DETECTED Final   Chlamydophila pneumoniae NOT DETECTED NOT DETECTED Final   Mycoplasma pneumoniae NOT DETECTED NOT DETECTED Final    Comment: Performed at [facility] [place] [facility], [address], [place], [place] [address]  Aspergillus Ag, BAL/Serum     Status: None   Collection Time: 07/11/23  5:44 AM   Specimen: Vein  Result Value Ref Range Status   Aspergillus Ag, BAL/Serum 0.02 0.00 - 0.49 Index Final    Comment: (NOTE) Performed At: [facility] [address] [place], [place] [id number] Jolene Schimke MD XB:[id number]      Labs: BNP (last 3 results) Recent Labs    06/26/23 2053 07/10/23 0617 07/10/23 2031  BNP 711.9* 2,198.2* 2,597.3*   Basic Metabolic Panel: Recent Labs  Lab 07/11/23 0544 07/12/23 0524 07/13/23 0551 07/14/23 0650 07/15/23 0735  NA 136 135 137 137 136  K 3.2* 3.9 4.0 4.2 4.4  CL 101 103 100 104 101  CO2 24 23 26 25 25   GLUCOSE 151* 112* 100* 101* 97  BUN 23* 27* 27* 27* 28*  CREATININE 1.45* 1.19 1.50* 1.17 1.46*  CALCIUM 8.8*  8.7* 8.9 8.9 9.0  MG 2.0  --  2.1 2.1 2.5*  PHOS 3.1  --   --   --   --    Liver Function Tests: Recent Labs  Lab 07/10/23 0617 07/12/23 0524  AST 33 21  ALT 11 8  ALKPHOS 114 93  BILITOT 0.7 0.7  PROT 7.2 6.7  ALBUMIN 3.1* 2.8*   No results for input(s): "LIPASE", "AMYLASE" in the last 168 hours. No results for input(s): "AMMONIA" in the last 168 hours. CBC: Recent Labs  Lab 07/10/23 0617 07/10/23 2032 07/11/23 0544 07/12/23 0524 07/14/23 0650  WBC 11.2* 10.0 10.2 7.7 8.5  NEUTROABS 7.8*  --  6.9  --   --   HGB 9.8* 10.3* 9.9* 9.5* 10.4*  HCT 31.0* 31.4* 31.1* 30.4* 32.7*  MCV 91.4 89.2 91.2 93.0 92.4  PLT 311 344 292 263 318   Cardiac Enzymes: No results for input(s): "CKTOTAL", "CKMB", "CKMBINDEX", "TROPONINI" in the last 168 hours. BNP: Invalid input(s): "POCBNP" CBG: Recent Labs  Lab 07/10/23 1832  GLUCAP 99   D-Dimer No results for input(s): "DDIMER" in the last 72 hours. Hgb A1c No results for input(s): "HGBA1C" in the last 72 hours. Lipid Profile No results for input(s): "CHOL", "HDL", "LDLCALC", "TRIG", "CHOLHDL", "LDLDIRECT" in the last 72 hours. Thyroid function studies No results for input(s): "TSH", "T4TOTAL", "T3FREE", "THYROIDAB" in the last 72 hours.  Invalid input(s): "FREET3" Anemia work up No results for input(s): "VITAMINB12", "FOLATE", "FERRITIN", "TIBC", "IRON", "RETICCTPCT" in the last 72 hours. Urinalysis    Component Value Date/Time   COLORURINE YELLOW 07/11/2023 0326   APPEARANCEUR CLEAR 07/11/2023 0326   LABSPEC 1.023 07/11/2023 0326   PHURINE 6.0 07/11/2023 0326   GLUCOSEU NEGATIVE 07/11/2023 0326   HGBUR NEGATIVE 07/11/2023 0326   BILIRUBINUR NEGATIVE 07/11/2023 0326   KETONESUR NEGATIVE 07/11/2023 0326   PROTEINUR 100 (A) 07/11/2023 0326   UROBILINOGEN 0.2 05/14/2015 0845   NITRITE NEGATIVE 07/11/2023 0326   LEUKOCYTESUR NEGATIVE 07/11/2023 0326   Sepsis Labs Recent Labs  Lab 07/10/23 2032 07/11/23 0544  07/12/23 0524 07/14/23 0650  WBC 10.0 10.2 7.7 8.5   Microbiology Recent Results (from the past 240 hours)  MRSA Next Gen by PCR, Nasal     Status: None   Collection Time: 07/11/23  3:39 AM   Specimen: Nasal Mucosa; Nasal Swab  Result Value Ref Range Status   MRSA by PCR Next Gen NOT DETECTED NOT DETECTED Final    Comment: (NOTE) The GeneXpert MRSA Assay (FDA approved for NASAL specimens only), is one  component of a comprehensive MRSA colonization surveillance program. It is not intended to diagnose MRSA infection nor to guide or monitor treatment for MRSA infections. Test performance is not FDA approved in patients less than 5 years old. Performed at [facility], [address] [place], [place] [address]   Resp panel by RT-PCR (RSV, Flu A&B, Covid) Nasal Mucosa     Status: None   Collection Time: 07/11/23  3:46 AM   Specimen: Nasal Mucosa; Nasal Swab  Result Value Ref Range Status   SARS Coronavirus 2 by RT PCR NEGATIVE NEGATIVE Final    Comment: (NOTE) SARS-CoV-2 target nucleic acids are NOT DETECTED.  The SARS-CoV-2 RNA is generally detectable in upper respiratory specimens during the acute phase of infection. The lowest concentration of SARS-CoV-2 viral copies this assay can detect is 138 copies/mL. A negative result does not preclude SARS-Cov-2 infection and should not be used as the sole basis for treatment or other patient management decisions. A negative result may occur with  improper specimen collection/handling, submission of specimen other than nasopharyngeal swab, presence of viral mutation(s) within the areas targeted by this assay, and inadequate number of viral copies(<138 copies/mL). A negative result must be combined with clinical observations, patient history, and epidemiological information. The expected result is Negative.  Fact Sheet for Patients:  BloggerCourse.com  Fact Sheet for Healthcare Providers:   SeriousBroker.it  This test is no t yet approved or cleared by the [place] FDA and  has been authorized for detection and/or diagnosis of SARS-CoV-2 by FDA under an Emergency Use Authorization (EUA). This EUA will remain  in effect (meaning this test can be used) for the duration of the COVID-19 declaration under Section 564(b)(1) of the Act, 21 U.S.C.section 360bbb-3(b)(1), unless the authorization is terminated  or revoked sooner.       Influenza A by PCR NEGATIVE NEGATIVE Final   Influenza B by PCR NEGATIVE NEGATIVE Final    Comment: (NOTE) The Xpert Xpress SARS-CoV-2/FLU/RSV plus assay is intended as an aid in the diagnosis of influenza from Nasopharyngeal swab specimens and should not be used as a sole basis for treatment. Nasal washings and aspirates are unacceptable for Xpert Xpress SARS-CoV-2/FLU/RSV testing.  Fact Sheet for Patients: BloggerCourse.com  Fact Sheet for Healthcare Providers: SeriousBroker.it  This test is not yet approved or cleared by the [place] FDA and has been authorized for detection and/or diagnosis of SARS-CoV-2 by FDA under an Emergency Use Authorization (EUA). This EUA will remain in effect (meaning this test can be used) for the duration of the COVID-19 declaration under Section 564(b)(1) of the Act, 21 U.S.C. section 360bbb-3(b)(1), unless the authorization is terminated or revoked.     Resp Syncytial Virus by PCR NEGATIVE NEGATIVE Final    Comment: (NOTE) Fact Sheet for Patients: BloggerCourse.com  Fact Sheet for Healthcare Providers: SeriousBroker.it  This test is not yet approved or cleared by the [place] FDA and has been authorized for detection and/or diagnosis of SARS-CoV-2 by FDA under an Emergency Use Authorization (EUA). This EUA will remain in effect (meaning this test can be used) for  the duration of the COVID-19 declaration under Section 564(b)(1) of the Act, 21 U.S.C. section 360bbb-3(b)(1), unless the authorization is terminated or revoked.  Performed at [facility], [address], [place], [place] [address]   Respiratory (~20 pathogens) panel by PCR     Status: None   Collection Time: 07/11/23  3:46 AM   Specimen: Nasopharyngeal Swab; Respiratory  Result Value Ref Range Status   Adenovirus NOT DETECTED NOT DETECTED Final   Coronavirus 229E NOT DETECTED NOT DETECTED Final    Comment: (NOTE) The Coronavirus on the Respiratory Panel, DOES NOT test for the novel  Coronavirus (2019 nCoV)    Coronavirus HKU1 NOT DETECTED NOT DETECTED Final   Coronavirus NL63 NOT DETECTED NOT DETECTED Final   Coronavirus OC43 NOT DETECTED NOT DETECTED Final   Metapneumovirus NOT DETECTED NOT DETECTED Final   Rhinovirus / Enterovirus NOT DETECTED NOT DETECTED Final   Influenza A NOT DETECTED NOT DETECTED Final   Influenza B NOT DETECTED NOT DETECTED Final   Parainfluenza Virus 1 NOT DETECTED NOT DETECTED Final   Parainfluenza Virus 2 NOT DETECTED NOT DETECTED Final   Parainfluenza Virus 3 NOT DETECTED NOT DETECTED Final   Parainfluenza Virus 4 NOT DETECTED NOT DETECTED Final   Respiratory Syncytial Virus NOT DETECTED NOT DETECTED Final   Bordetella pertussis NOT DETECTED NOT DETECTED Final   Bordetella Parapertussis NOT DETECTED NOT DETECTED Final   Chlamydophila pneumoniae NOT DETECTED NOT DETECTED Final   Mycoplasma pneumoniae NOT DETECTED NOT DETECTED Final    Comment: Performed at [facility], [address], [place], [place] [address]  Aspergillus Ag, BAL/Serum     Status: None   Collection Time: 07/11/23  5:44 AM   Specimen: Vein  Result Value Ref Range Status   Aspergillus Ag, BAL/Serum 0.02 0.00 - 0.49 Index Final    Comment: (NOTE) Performed At: [facility] [address] [place], [place] [id number] Jolene Schimke MD  XB:[phone]      Time coordinating discharge:  I have spent 35 minutes face to face with the patient and on the ward discussing the patients care, assessment, plan and disposition with other care givers. >50% of the time was devoted counseling the patient about the risks and benefits of treatment/Discharge disposition and coordinating care.   SIGNED:   Miguel Rota, MD  [facility] 07/16/2023, 12:23 PM   If 7PM-7AM, please contact night-coverage

## 2023-07-18 ENCOUNTER — Telehealth: Payer: Self-pay

## 2023-07-18 LAB — HISTOPLASMA ANTIGEN, URINE: Histoplasma Antigen, urine: NEGATIVE (ref <0.2)

## 2023-07-18 LAB — BLASTOMYCES ANTIGEN
Blastomyces Antigen: NOT DETECTED ng/mL
Interpretation: NEGATIVE

## 2023-07-18 NOTE — Transitions of Care (Post Inpatient/ED Visit) (Signed)
   07/18/2023  Name: TAJAY MCCARRY MRN: [id number] DOB:  Today's TOC FU Call Status: Today's TOC FU Call Status:: Unsuccessful Call (2nd Attempt) Unsuccessful Call (2nd Attempt) Date: 07/18/23  Attempted to reach the patient regarding the most recent Inpatient/ED visit.  Follow Up Plan: Additional outreach attempts will be made to reach the patient to complete the Transitions of Care (Post Inpatient/ED visit) call.   Alyse Low, RN, BA, [facility], CRRN [facility] Coordinator, Transition of Care Ph # [phone]

## 2023-07-19 ENCOUNTER — Telehealth: Payer: Self-pay

## 2023-07-19 NOTE — Transitions of Care (Post Inpatient/ED Visit) (Signed)
07/19/2023  Name: Craig Schmidt MRN: [id number] DOB: [birth year]  Today's TOC FU Call Status: Today's TOC FU Call Status:: Successful TOC FU Call Completed TOC FU Call Complete Date: 07/19/23 Patient's Name and Date of Birth confirmed.  Transition Care Management Follow-up Telephone Call Date of Discharge: 07/15/23 Discharge Facility: [facility]) Type of Discharge: Inpatient Admission  Items Reviewed:    Medications Reviewed Today: Medications Reviewed Today     Reviewed by Marcos Eke, RN (Registered Nurse) on 07/19/23 at 1215  Med List Status: <None>   Medication Order Taking? Sig Documenting Provider Last Dose Status Informant  amoxicillin-clavulanate (AUGMENTIN) 875-125 MG tablet [id number] Yes Take 1 tablet by mouth every 12 (twelve) hours for 28 days. Alberteen Sam, MD Taking Active Self, Pharmacy Records           Med Note Cyndie Chime, [facility]   Thu Jun 27, 2023  1:14 AM) ABT Start Date 06/22/23  apixaban (ELIQUIS) 5 MG TABS tablet [id number] Yes Take 1 tablet (5 mg total) by mouth 2 (two) times daily. Meredeth Ide, MD Taking Active   aspirin EC 81 MG tablet [id number] Yes Take 1 tablet (81 mg total) by mouth daily. Swallow whole. Meredeth Ide, MD Taking Active   atorvastatin (LIPITOR) 40 MG tablet [id number] Yes Take 1 tablet (40 mg total) by mouth daily. Marinda Elk, MD Taking Active Self, Pharmacy Records  carvedilol (COREG) 3.125 MG tablet [id number] Yes Take 1 tablet (3.125 mg total) by mouth 2 (two) times daily. Marinda Elk, MD Taking Active Self, Pharmacy Records  empagliflozin (JARDIANCE) 10 MG TABS tablet [id number] Yes Take 1 tablet (10 mg total) by mouth daily. Meredeth Ide, MD Taking Active   furosemide (LASIX) 40 MG tablet [id number] Yes Take 1 tablet (40 mg total) by mouth daily. Meredeth Ide, MD Taking Active   nicotine polacrilex (NICORETTE) 2 MG gum [id number] Yes Take 1 each (2 mg total) by mouth as needed for smoking  cessation. Meredeth Ide, MD Taking Active   oxyCODONE-acetaminophen (PERCOCET) 5-325 MG tablet [id number] Yes Take 1 tablet by mouth every 6 (six) hours as needed. Bethann Berkshire, MD Taking Active Self, Pharmacy Records           Med Note Danley Danker Jul 11, 2023  7:38 AM) Haven't picked up since being in the hospital - will pick them up at discharge.  sacubitril-valsartan (ENTRESTO) 24-26 MG [id number] Yes Take 1 tablet by mouth 2 (two) times daily. Miguel Rota, MD Taking Active   spironolactone (ALDACTONE) 25 MG tablet [id number] Yes Take 0.5 tablets (12.5 mg total) by mouth daily. Meredeth Ide, MD Taking Active   umeclidinium bromide (INCRUSE ELLIPTA) 62.5 MCG/ACT AEPB [id number] Yes Inhale 1 puff into the lungs daily. Danford, Earl Lites, MD Taking Active Self, Pharmacy Records            Home Care and Equipment/Supplies:    Functional Questionnaire:    Follow up appointments reviewed:    SDOH Interventions Today    Flowsheet Row Most Recent Value  SDOH Interventions   Food Insecurity Interventions AMB Referral  Housing Interventions AMB Referral  Transportation Interventions AMB Referral, Other (Comment)  [Provided patient with non-emergent medical transportation # [phone], [employer]) that his Associate Professor with to provide transportation to medical appointments, advised patient he must call at least 2 days before his scheduled appt]       Alyse Low, RN, BA, [facility], CRRN  [facility] Population Health Care Management Coordinator, Transition of Care Ph # [phone]

## 2023-07-26 ENCOUNTER — Telehealth: Payer: Self-pay

## 2023-07-26 ENCOUNTER — Other Ambulatory Visit: Payer: Self-pay

## 2023-07-26 NOTE — Patient Instructions (Signed)
 Visit Information  Thank you for taking time to visit with me today. Please don't hesitate to contact me if I can be of assistance to you before our next scheduled telephone appointment.  Our next appointment is by telephone on 08/02/23 at 11am  Following is a copy of your care plan:   Goals Addressed             This Visit's Progress    Transition of Care       Current Barriers:  Knowledge Deficits related to plan of care for management of CHF and recent hospitalizations for Pneumonia   RNCM Clinical Goal(s):  Patient will work with the Care Management team over the next 30 days to address Transition of Care Barriers: Medication access Medication Management Diet/Nutrition/Food Resources Support at home Provider appointments Transportation Referral to [employer] to review multiple SDOH concerns  through collaboration with Medical Illustrator, provider, and care team.   Interventions: Evaluation of current treatment plan related to  self management and patient's adherence to plan as established by provider   Heart Failure Interventions:  (Status:  New goal.) Short Term Goal Basic overview and discussion of pathophysiology of Heart Failure reviewed Provided education on low sodium diet Discussed importance of daily weight and advised patient to weigh and record daily Reviewed role of diuretics in prevention of fluid overload and management of heart failure; Discussed the importance of keeping all appointments with provider Provided patient with education about the role of exercise in the management of heart failure Advised patient to discuss Heart Failure management with provider Assessed social determinant of health barriers  Referred to BSW Thersia Hoar to discuss ongoing SDOH challenges  Pain Interventions:  (Status:  New goal.) Short Term Goal Pain assessment performed Medications reviewed Discussed importance of adherence to all scheduled medical  appointments Counseled on the importance of reporting any/all new or changed pain symptoms or management strategies to pain management provider Reviewed with patient prescribed pharmacological and nonpharmacological pain relief strategies Advised patient to discuss his request for new prescription for Percocet and to discuss type, location, level of pain and whether continued use of Percocet as needed is best choice or if there are other meds that may help with provider Assessed social determinant of health barriers Assessed type, location, level of pain - patient states his feet are very painful, feels sharp, burning-like pains, ? consistent with neuropathic type pain.  Patient Goals/Self-Care Activities: Participate in Transition of Care Program/Attend TOC scheduled calls Take all medications as prescribed Attend all scheduled provider appointments Call pharmacy for medication refills 3-7 days in advance of running out of medications Perform all self care activities independently  Call provider office for new concerns or questions  Work with the social worker to address care coordination needs and will continue to work with the clinical team to address health care and disease management related needs  Follow Up Plan:  The patient has been provided with contact information for the care management team and has been advised to call with any health related questions or concerns.          Patient verbalizes understanding of instructions and care plan provided today and agrees to view in MyChart. Active MyChart status and patient understanding of how to access instructions and care plan via MyChart confirmed with patient.     The patient has been provided with contact information for the care management team and has been advised to call with any health related questions or  concerns.   Please call the care guide team at [phone] if you need to cancel or reschedule your appointment.    Please call 1-800-273-TALK (toll free, 24 hour hotline) if you are experiencing a Mental Health or Behavioral Health Crisis or need someone to talk to.  Channing Larry, RN, BA, [facility], CRRN [facility] Coordinator, Transition of Care Ph # [phone]

## 2023-07-26 NOTE — Patient Outreach (Signed)
 Care Management  Transitions of Care Program Managed Medicaid Transitions of Care week 2   07/26/2023 Name: Craig Schmidt MRN: [id number] DOB: 1971-04-13  Subjective: Craig Schmidt is a 53 y.o. year old male who is a primary care patient of Lorren Greig PARAS, NP. The Care Management team Engaged with patient Engaged with patient by telephone to assess and address transitions of care needs.   Consent to Services:  Patient was given information about Managed Medicaid Care Management services, agreed to services, and gave verbal consent to participate.   Assessment:           SDOH Interventions    Flowsheet Row Telephone from 07/19/2023 in [facility] to Hosp-Admission (Discharged) from 07/10/2023 in [facility] to Hosp-Admission (Discharged) from 06/26/2023 in [facility] ED to Hosp-Admission (Discharged) from 06/19/2023 in [facility] Telephone from 03/07/2023 in [facility] from 01/23/2023 in [facility]  SDOH Interventions        Food Insecurity Interventions AMB Referral Community Resources Provided, Inpatient TOC -- Inpatient TOC, Other (Comment)  [Resources added to AVS] -- --  Housing Interventions AMB Referral Community Resources Provided, Inpatient TOC -- Inpatient TOC, Other (Comment)  [Resource added to AVS] -- --  Transportation Interventions AMB Referral, Other (Comment)  [Provided patient with non-emergent medical transportation # [phone], [employer]) that his associate professor with to provide transportation to medical appointments, advised patient he must call at least 2 days before his scheduled appt] [employer] Provided, [employer], Inpatient TOC, Bus Pass Given [employer] Given Inpatient TOC, [employer]efit, Other (Comment)  [Resource added to AVS] [employer] Given [employer] Given  Utilities Interventions -- Inpatient TOC, Intervention Not Indicated -- Inpatient TOC, Intervention Not Indicated  [Houseless. Has no utilities] -- --        Goals Addressed             This Visit's Progress    Transition of Care       Current Barriers:  Knowledge Deficits related to plan of care for management of CHF and recent hospitalizations for Pneumonia   RNCM Clinical Goal(s):  Patient will work with the Care Management team over the next 30 days to address Transition of Care Barriers: Medication access Medication Management Diet/Nutrition/Food Resources Support at home Provider appointments Transportation Referral to [employer] to review multiple SDOH concerns  through collaboration with Medical Illustrator, provider, and care team.   Interventions: Evaluation of current treatment plan related to  self management and patient's adherence to plan as established by provider   Heart Failure Interventions:  (Status:  New goal.) Short Term Goal Basic overview and discussion of pathophysiology of Heart Failure reviewed Provided education on low sodium diet Discussed importance of daily weight and advised patient to weigh and record daily Reviewed role of diuretics in prevention of fluid overload and management of heart failure; Discussed the importance of keeping all appointments with provider Provided patient with education about the role of exercise in the management of heart failure Advised patient to discuss Heart Failure management with provider Assessed social determinant of health barriers  Referred to BSW Thersia Hoar to discuss ongoing SDOH challenges  Pain Interventions:  (Status:  New goal.) Short Term Goal Pain assessment performed Medications reviewed Discussed importance of  adherence to all scheduled medical appointments Counseled on the importance of reporting  any/all new or changed pain symptoms or management strategies to pain management provider Reviewed with patient prescribed pharmacological and nonpharmacological pain relief strategies Advised patient to discuss his request for new prescription for Percocet and to discuss type, location, level of pain and whether continued use of Percocet as needed is best choice or if there are other meds that may help with provider Assessed social determinant of health barriers Assessed type, location, level of pain - patient states his feet are very painful, feels sharp, burning-like pains, ? consistent with neuropathic type pain.  Patient Goals/Self-Care Activities: Participate in Transition of Care Program/Attend TOC scheduled calls Take all medications as prescribed Attend all scheduled provider appointments Call pharmacy for medication refills 3-7 days in advance of running out of medications Perform all self care activities independently  Call provider office for new concerns or questions  Work with the social worker to address care coordination needs and will continue to work with the clinical team to address health care and disease management related needs  Follow Up Plan:  The patient has been provided with contact information for the care management team and has been advised to call with any health related questions or concerns.          Plan: The patient has been provided with contact information for the care management team and has been advised to call with any health related questions or concerns.   Channing Larry, RN, BA, [facility], CRRN [facility] Coordinator, Transition of Care Ph # [phone]

## 2023-07-29 ENCOUNTER — Ambulatory Visit (INDEPENDENT_AMBULATORY_CARE_PROVIDER_SITE_OTHER): Payer: 59 | Admitting: Family

## 2023-07-29 ENCOUNTER — Encounter: Payer: Self-pay | Admitting: Family

## 2023-07-29 VITALS — BP 150/112 | HR 101 | Temp 97.4°F | Ht 67.0 in | Wt 145.2 lb

## 2023-07-29 DIAGNOSIS — J449 Chronic obstructive pulmonary disease, unspecified: Secondary | ICD-10-CM | POA: Diagnosis not present

## 2023-07-29 DIAGNOSIS — F1910 Other psychoactive substance abuse, uncomplicated: Secondary | ICD-10-CM

## 2023-07-29 DIAGNOSIS — F17200 Nicotine dependence, unspecified, uncomplicated: Secondary | ICD-10-CM

## 2023-07-29 DIAGNOSIS — Z0289 Encounter for other administrative examinations: Secondary | ICD-10-CM

## 2023-07-29 DIAGNOSIS — J189 Pneumonia, unspecified organism: Secondary | ICD-10-CM

## 2023-07-29 DIAGNOSIS — M25519 Pain in unspecified shoulder: Secondary | ICD-10-CM

## 2023-07-29 DIAGNOSIS — Z8673 Personal history of transient ischemic attack (TIA), and cerebral infarction without residual deficits: Secondary | ICD-10-CM

## 2023-07-29 DIAGNOSIS — M79672 Pain in left foot: Secondary | ICD-10-CM

## 2023-07-29 DIAGNOSIS — I050 Rheumatic mitral stenosis: Secondary | ICD-10-CM

## 2023-07-29 DIAGNOSIS — R32 Unspecified urinary incontinence: Secondary | ICD-10-CM

## 2023-07-29 DIAGNOSIS — L309 Dermatitis, unspecified: Secondary | ICD-10-CM

## 2023-07-29 DIAGNOSIS — M79671 Pain in right foot: Secondary | ICD-10-CM

## 2023-07-29 DIAGNOSIS — Z8679 Personal history of other diseases of the circulatory system: Secondary | ICD-10-CM

## 2023-07-29 DIAGNOSIS — I5082 Biventricular heart failure: Secondary | ICD-10-CM

## 2023-07-29 DIAGNOSIS — Z09 Encounter for follow-up examination after completed treatment for conditions other than malignant neoplasm: Secondary | ICD-10-CM

## 2023-07-29 DIAGNOSIS — F17210 Nicotine dependence, cigarettes, uncomplicated: Secondary | ICD-10-CM

## 2023-07-29 MED ORDER — TRIAMCINOLONE ACETONIDE 0.025 % EX CREA: 1.0000 | TOPICAL_CREAM | Freq: Two times a day (BID) | CUTANEOUS | 1 refills | Status: DC

## 2023-07-29 NOTE — Progress Notes (Signed)
 Patient states he gave the ladies up front more paperwork to be completed.   Patient asking for prescription for pain medication. States he is experiencing pain in his feet and needs pain medication.    Patient asking for cream that [facility] had him on.   Patient asking for multiple prescriptions.

## 2023-07-29 NOTE — Progress Notes (Signed)
 Patient ID: YEHONATAN GRANDISON, male    DOB: [birth year]  MRN: [id number]  CC: Hospital Discharge Follow-Up  Subjective: Kevan Prouty is a 53 y.o. male who presents for hospital discharge follow-up. He is accompanied by his friend.  His concerns today include:  07/10/2023 - 07/15/2023 [facility] per MD note:  Recommendations for Outpatient Follow-up:  Follow up with PCP in 1-2 weeks Please obtain BMP/CBC in one week your next doctors visit.  Homeless shelter resources given 4 weeks of p.o. Augmentin  prescribed Cardiac medications adjusted as below and stop Plavix  at cardiology request  Assessment & Plan:  Principal Problem:   Multifocal pneumonia    Acute biventricular failure with reduced EF, 20% Moderate mitral valve stenosis History of CAD Echo 12/5 showed EF less than 20%.  Cardiology team following.  Now on Lasix  p.o. 40 mg daily, Coreg , Aldactone , Jardiance , Entresto  Current recommendations Eliquis  and aspirin    Polysubstance abuse - Positive for cocaine , will defer beta-blocker use in the setting of cocaine  to cardiology.     History of a multifocal pneumonia with necrotizing component Status post BAL 12/11.  Bacterial workup is negative.  Procalcitonin negative.  Respiratory viral panel negative.  Previous provider discussed case with pulmonary.  Recommending continuing IV Zosyn  in the hospital thereafter transition to p.o. Augmentin  upon discharge     AKI stage I Background CKD stage II Baseline creatinine approximately 1.1, elevated 1.5 at admission.  Suspected cardiorenal - Diuresis per above   Substance use disorder Current smoking Counseled to quit using     History of stroke suspected cardioembolic Currently on Eliquis , aspirin , Lipitor   Suspected COPD  Continue home Incruse Ellipta , albuterol     Shoulder pain secondary to osteoarthritis X-ray of the shoulder showing osteoarthritis     DVT prophylaxis: apixaban  (ELIQUIS ) tablet 5  mg   Today's office visit 07/29/2023: Patient reports feeling improved since hospital discharge. He does not complain of red flag symptoms such as but not limited to chest pain, shortness of breath, worst headache of life, nausea/vomiting. Reports he is doing well on all medications, no issues/concerns. He is aware of upcoming appointment with Pulmonology. Reports he currently has an apartment and his friend is helping him with activities of daily living and making sure he takes his  medications and needs Personal Care Services form completed. Needs referral to Podiatry for continued pain of feet. States he was told he may have arthritis in his feet and may need orthotic shoes. Needs referral to Orthopedics for continued pain of shoulder related to arthritis. States he lost shoulder sling and needs a new one. Needs urinary incontinence supplies from [facility]. Needs eczema cream refilled. No further issues/concerns for discussion today.  Patient Active Problem List   Diagnosis Date Noted   Coronary artery disease 06/27/2023   History of CVA (cerebrovascular accident) 06/27/2023   Substance use disorder 06/27/2023   Atypical chest pain 06/21/2023   Pulmonary infiltrate 06/21/2023   Multifocal pneumonia 06/11/2023   Hyperkalemia 06/11/2023   Hyponatremia 06/11/2023   Normocytic anemia 06/11/2023   Thrombocytosis 06/11/2023   Troponin I above reference range 05/25/2023   CAP (community acquired pneumonia) 05/25/2023   Pneumonia 05/25/2023   Chronic combined systolic and diastolic CHF (congestive heart failure) (HCC) 12/03/2022   Malnutrition of moderate degree 11/30/2022   Acute on chronic combined systolic and diastolic CHF (congestive heart failure) (HCC) 11/29/2022   Acute on chronic combined systolic (congestive) and diastolic (congestive) heart failure (HCC) 11/28/2022  ACS (acute coronary syndrome) (HCC) 11/28/2022   Back pain 11/28/2022   Essential hypertension 11/28/2022    Hyperlipidemia 11/28/2022   Noncompliance with medication regimen 11/28/2022   ST elevation myocardial infarction (STEMI) of inferolateral wall, subsequent episode of care (HCC) 12/19/2021   ST elevation myocardial infarction (STEMI) (HCC)    Ischemic cardiomyopathy    Cocaine  abuse (HCC)    Smoker    Alcohol  abuse    CVA (cerebral vascular accident) (HCC) 09/08/2021   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   Tear of MCL (medial collateral ligament) of knee 05/20/2015   Protein-calorie malnutrition, severe 05/17/2015   Lactic acidosis 05/15/2015   Left knee pain 05/15/2015   Lung nodules 05/15/2015   Hypoglycemia 05/14/2015   Hypothermia 05/14/2015   Acute encephalopathy 05/14/2015   Cocaine  abuse with intoxication (HCC) 05/14/2015   Alcohol  intoxication in active alcoholic (HCC) 05/14/2015   Sepsis (HCC) 05/14/2015   Homeless 05/14/2015     Current Outpatient Medications on File Prior to Visit  Medication Sig Dispense Refill   apixaban  (ELIQUIS ) 5 MG TABS tablet Take 1 tablet (5 mg total) by mouth 2 (two) times daily. 60 tablet 3   aspirin  EC 81 MG tablet Take 1 tablet (81 mg total) by mouth daily. Swallow whole. 30 tablet 12   atorvastatin  (LIPITOR) 40 MG tablet Take 1 tablet (40 mg total) by mouth daily. 30 tablet 6   carvedilol  (COREG ) 3.125 MG tablet Take 1 tablet (3.125 mg total) by mouth 2 (two) times daily. 60 tablet 6   empagliflozin  (JARDIANCE ) 10 MG TABS tablet Take 1 tablet (10 mg total) by mouth daily. 30 tablet 2   furosemide  (LASIX ) 40 MG tablet Take 1 tablet (40 mg total) by mouth daily. 30 tablet 0   nicotine  polacrilex (NICORETTE ) 2 MG gum Take 1 each (2 mg total) by mouth as needed for smoking cessation. 110 tablet 0   sacubitril -valsartan  (ENTRESTO ) 24-26 MG Take 1 tablet by mouth 2 (two) times daily. 60 tablet 0   spironolactone  (ALDACTONE ) 25 MG tablet Take 0.5 tablets (12.5 mg total) by mouth daily. 15 tablet 2   oxyCODONE -acetaminophen  (PERCOCET)  5-325 MG tablet Take 1 tablet by mouth every 6 (six) hours as needed. 15 tablet 0   No current facility-administered medications on file prior to visit.    Allergies  Allergen Reactions   Shellfish Allergy Anaphylaxis   Peanut (Diagnostic) Itching    Social History   Socioeconomic History   Marital status: Significant Other    Spouse name: Not on file   Number of children: 7   Years of education: Not on file   Highest education level: 9th grade  Occupational History   Occupation: unemployed    Comment: experiencing homelessness-living at daily rent motels.  Tobacco Use   Smoking status: Every Day    Current packs/day: 1.00    Average packs/day: 1 pack/day for 41.0 years (41.0 ttl pk-yrs)    Types: Cigarettes    Passive exposure: Current   Smokeless tobacco: Never  Vaping Use   Vaping status: Never Used  Substance and Sexual Activity   Alcohol  use: Yes    Alcohol /week: 63.0 standard drinks of alcohol     Types: 63 Cans of beer per week    Comment: 3-40oz beer/day x10 years. 1-2 40oz beer/day in 20s/30s   Drug use: Yes    Types: Crack cocaine , Cocaine     Comment: last use 01/13/22   Sexual activity: Not Currently  Other Topics Concern   Not  on file  Social History Narrative   Not on file   Social Drivers of Health   Financial Resource Strain: High Risk (07/19/2023)   Overall Financial Resource Strain (CARDIA)    Difficulty of Paying Living Expenses: Very hard  Food Insecurity: Food Insecurity Present (07/19/2023)   Hunger Vital Sign    Worried About Running Out of Food in the Last Year: Often true    Ran Out of Food in the Last Year: Often true  Transportation Needs: Unmet Transportation Needs (07/19/2023)   PRAPARE - Administrator, Civil Service (Medical): Yes    Lack of Transportation (Non-Medical): Yes  Physical Activity: Not on file  Stress: Stress Concern Present (10/16/2022)   [employer] of Occupational Health - Occupational Stress  Questionnaire    Feeling of Stress : To some extent  Social Connections: Not on file  Intimate Partner Violence: Not At Risk (07/19/2023)   Humiliation, Afraid, Rape, and Kick questionnaire    Fear of Current or Ex-Partner: No    Emotionally Abused: No    Physically Abused: No    Sexually Abused: No    Family History  Problem Relation Age of Onset   Diabetes Mother     Past Surgical History:  Procedure Laterality Date   BRONCHIAL NEEDLE ASPIRATION BIOPSY  07/03/2023   Procedure: BRONCHIAL NEEDLE ASPIRATION BIOPSIES;  Surgeon: Claudene Toribio BROCKS, MD;  Location: [facility] ENDOSCOPY;  Service: Pulmonary;;   BRONCHIAL WASHINGS  07/03/2023   Procedure: BRONCHIAL WASHINGS;  Surgeon: Claudene Toribio BROCKS, MD;  Location: [facility] ENDOSCOPY;  Service: Pulmonary;;   BUBBLE STUDY  09/11/2021   Procedure: BUBBLE STUDY;  Surgeon: Okey Vina GAILS, MD;  Location: [facility] ENDOSCOPY;  Service: Cardiovascular;;   LEFT HEART CATH AND CORONARY ANGIOGRAPHY N/A 12/19/2021   Procedure: LEFT HEART CATH AND CORONARY ANGIOGRAPHY;  Surgeon: Claudene Victory ORN, MD;  Location: [facility] INVASIVE CV LAB;  Service: Cardiovascular;  Laterality: N/A;   RIGHT/LEFT HEART CATH AND CORONARY ANGIOGRAPHY N/A 04/13/2022   Procedure: RIGHT/LEFT HEART CATH AND CORONARY ANGIOGRAPHY;  Surgeon: Rolan Ezra RAMAN, MD;  Location: [facility] INVASIVE CV LAB;  Service: Cardiovascular;  Laterality: N/A;   TEE WITHOUT CARDIOVERSION N/A 09/11/2021   Procedure: TRANSESOPHAGEAL ECHOCARDIOGRAM (TEE);  Surgeon: Okey Vina GAILS, MD;  Location: [facility] ENDOSCOPY;  Service: Cardiovascular;  Laterality: N/A;   VIDEO BRONCHOSCOPY WITH ENDOBRONCHIAL ULTRASOUND N/A 07/03/2023   Procedure: VIDEO BRONCHOSCOPY WITH ENDOBRONCHIAL ULTRASOUND;  Surgeon: Claudene Toribio BROCKS, MD;  Location: [facility] ENDOSCOPY;  Service: Pulmonary;  Laterality: N/A;    ROS: Review of Systems Negative except as stated above  PHYSICAL EXAM: BP (!) 150/112   Pulse (!) 101   Temp (!) 97.4 F (36.3 C) (Oral)   Ht 5' 7 (1.702 m)    Wt 145 lb 3.2 oz (65.9 kg)   SpO2 98%   BMI 22.74 kg/m    Physical Exam HENT:     Head: Normocephalic and atraumatic.     Nose: Nose normal.     Mouth/Throat:     Mouth: Mucous membranes are moist.     Pharynx: Oropharynx is clear.  Eyes:     Extraocular Movements: Extraocular movements intact.     Conjunctiva/sclera: Conjunctivae normal.     Pupils: Pupils are equal, round, and reactive to light.  Cardiovascular:     Rate and Rhythm: Tachycardia present.     Pulses: Normal pulses.     Heart sounds: Normal heart sounds.  Pulmonary:     Effort: Pulmonary effort is  normal.     Breath sounds: Normal breath sounds.  Musculoskeletal:        General: Normal range of motion.     Cervical back: Normal range of motion and neck supple.  Neurological:     General: No focal deficit present.     Mental Status: He is alert and oriented to person, place, and time.  Psychiatric:        Mood and Affect: Mood normal.        Behavior: Behavior normal.     ASSESSMENT AND PLAN: 1. Hospital discharge follow-up (Primary) - Reviewed hospital course, current medications, ensured proper follow-up in place, and addressed concerns.  - Routine screening.  - Basic Metabolic Panel - CBC  2. Multifocal pneumonia 3. Chronic obstructive pulmonary disease, unspecified COPD type (HCC) - Continue present management. No refills needed as of present.  - Keep upcoming appointment with Pulmonology.   4. Biventricular failure (HCC) 5. Moderate mitral valve stenosis 6. History of CAD (coronary artery disease) 7. Polysubstance abuse (HCC) 8. Current smoker - Continue present management. No refills needed as of present. - Referral to Cardiology for evaluation/management. - Follow-up with primary provider as scheduled.  - Ambulatory referral to Cardiology  9. History of stroke - Continue present management. No refills needed as of present. - Referral to Neurology for evaluation/management.  -  Follow-up with primary provider as scheduled.  - Ambulatory referral to Neurology  10. Arthralgia of shoulder, unspecified laterality - Sling ordered per patient request.  - Referral to Orthopedic Surgery for evaluation/management.  - Follow-up with primary provider as scheduled.  - Ambulatory referral to Orthopedic Surgery - Sling  11. Pain in both feet - Referral to Podiatry for evaluation/management.  - Follow-up with primary provider as scheduled.  - Ambulatory referral to Podiatry  12. Eczema, unspecified type - Triamcinolone  cream as prescribed. Counseled on medication adherence/adverse effects.  - Follow-up with primary provider as scheduled.  - triamcinolone  (KENALOG ) 0.025 % cream; Apply 1 Application topically 2 (two) times daily.  Dispense: 60 g; Refill: 1  13. Encounter for completion of form with patient 14. Urinary incontinence, unspecified type - [facility] completed.  - Request for Independent Assessment For Personal Care Services Attestation Of Medical Need completed.     Patient was given the opportunity to ask questions.  Patient verbalized understanding of the plan and was able to repeat key elements of the plan. Patient was given clear instructions to go to Emergency Department or return to medical center if symptoms don't improve, worsen, or new problems develop.The patient verbalized understanding.   Orders Placed This Encounter  Procedures   Sling   Basic Metabolic Panel   CBC   Ambulatory referral to Cardiology   Ambulatory referral to Podiatry   Ambulatory referral to Orthopedic Surgery   Ambulatory referral to Neurology     Requested Prescriptions   Signed Prescriptions Disp Refills   triamcinolone  (KENALOG ) 0.025 % cream 60 g 1    Sig: Apply 1 Application topically 2 (two) times daily.    Follow-up with primary provider as scheduled.   Greig JINNY Drones, NP

## 2023-07-30 ENCOUNTER — Other Ambulatory Visit: Payer: Self-pay | Admitting: Family

## 2023-07-30 ENCOUNTER — Telehealth: Payer: Self-pay | Admitting: Family

## 2023-07-30 DIAGNOSIS — D649 Anemia, unspecified: Secondary | ICD-10-CM

## 2023-07-30 LAB — BASIC METABOLIC PANEL
BUN/Creatinine Ratio: 23 — ABNORMAL HIGH (ref 9–20)
BUN: 29 mg/dL — ABNORMAL HIGH (ref 6–24)
CO2: 20 mmol/L (ref 20–29)
Calcium: 10.5 mg/dL — ABNORMAL HIGH (ref 8.7–10.2)
Chloride: 102 mmol/L (ref 96–106)
Creatinine, Ser: 1.27 mg/dL (ref 0.76–1.27)
Glucose: 95 mg/dL (ref 70–99)
Potassium: 5.2 mmol/L (ref 3.5–5.2)
Sodium: 140 mmol/L (ref 134–144)
eGFR: 69 mL/min/{1.73_m2} (ref >59)

## 2023-07-30 LAB — CBC
Hematocrit: 41.2 % (ref 37.5–51.0)
Hemoglobin: 12.6 g/dL — ABNORMAL LOW (ref 13.0–17.7)
MCH: 27.8 pg (ref 26.6–33.0)
MCHC: 30.6 g/dL — ABNORMAL LOW (ref 31.5–35.7)
MCV: 91 fL (ref 79–97)
Platelets: 477 10*3/uL — ABNORMAL HIGH (ref 150–450)
RBC: 4.54 x10E6/uL (ref 4.14–5.80)
RDW: 15.7 % — ABNORMAL HIGH (ref 11.6–15.4)
WBC: 8.6 10*3/uL (ref 3.4–10.8)

## 2023-07-30 NOTE — Telephone Encounter (Signed)
 noted

## 2023-07-30 NOTE — Telephone Encounter (Signed)
 Called pt and made aware paperwork is ready for pickup

## 2023-07-30 NOTE — Telephone Encounter (Signed)
 Copied from CRM [id number]. Topic: General - Other >> Jul 30, 2023  9:05 AM Edsel HERO wrote: Patient called to let office know that his brother, Lonell Makos will be picking up his paperwork today. Patient also wanted to know if provider ordered a sling for him. Please advise.

## 2023-07-30 NOTE — Telephone Encounter (Signed)
 Pt called in , asked to be called back when paperwork is ready to be picked up

## 2023-07-30 NOTE — Telephone Encounter (Signed)
 Paperwork left up front with Val.

## 2023-08-02 ENCOUNTER — Telehealth: Payer: Self-pay

## 2023-08-02 ENCOUNTER — Other Ambulatory Visit: Payer: Self-pay

## 2023-08-02 LAB — FUNGAL ORGANISM REFLEX

## 2023-08-02 LAB — FUNGUS CULTURE WITH STAIN

## 2023-08-02 LAB — FUNGUS CULTURE RESULT

## 2023-08-02 NOTE — Patient Outreach (Signed)
  Care Management  Transitions of Care Program Managed Medicaid Transitions of Care week 3  08/02/2023 Name: Craig Schmidt MRN: [id number] DOB: 1970-12-01  Subjective: Craig Schmidt is a 53 y.o. year old male who is a primary care patient of Lorren Greig PARAS, NP. The Care Management team spoke with patient by telephone to assess and address transitions of care needs.   Plan: Additional outreach attempts will be made to reach the patient enrolled in the [facility] Program (Post Inpatient/ED Visit).  Channing Larry, RN, BA, [facility], CRRN [facility] Coordinator, Transition of Care Ph # [phone]

## 2023-08-04 LAB — FUNGUS CULTURE WITH STAIN

## 2023-08-04 LAB — FUNGUS CULTURE RESULT

## 2023-08-04 LAB — FUNGAL ORGANISM REFLEX

## 2023-08-06 ENCOUNTER — Encounter: Payer: 59 | Admitting: Physician Assistant

## 2023-08-07 ENCOUNTER — Ambulatory Visit: Payer: Medicaid Other | Admitting: Podiatry

## 2023-08-07 ENCOUNTER — Telehealth: Payer: Self-pay | Admitting: Family

## 2023-08-07 ENCOUNTER — Ambulatory Visit: Payer: Self-pay | Admitting: *Deleted

## 2023-08-07 NOTE — Telephone Encounter (Signed)
-   Request for Independent Assessment For Personal Care Services Attestation Of Medical Need completed during office visit 07/29/2023.  Craig Schmidt if you will please assist patient. Please let me know if I can be of further assistance. Thank you.

## 2023-08-07 NOTE — Telephone Encounter (Signed)
 Opened chart to answer question for agent.   He got a message on his MyChart about a medication.   He can't get into MyChart so calling to see what the message was about.    Only thing I see ordered was the Kenalog  cream for his eczema on 07/29/2023.   He needs to check with his phar. For that rx.   That was probably the message from MyChart.  Agent needing to transfer him to the MyChart Help Desk since he can't get into his MyChart.    She will relay this message to him to check with his phar. For the cream for his eczema.

## 2023-08-07 NOTE — Telephone Encounter (Signed)
 Patient is needing a referral for a home aide to come out during the day to assist him.   This was discussed at last visit but he states that he is now needing it. Please advise.

## 2023-08-08 ENCOUNTER — Telehealth: Payer: Self-pay | Admitting: Family

## 2023-08-08 NOTE — Telephone Encounter (Signed)
Faxed 08/08/2023 @3 :17PM

## 2023-08-08 NOTE — Telephone Encounter (Signed)
Noted  

## 2023-08-08 NOTE — Telephone Encounter (Signed)
Erskine Squibb, I am unsure. Ulrick please confirm.

## 2023-08-08 NOTE — Telephone Encounter (Signed)
 Craig Schmidt

## 2023-08-09 ENCOUNTER — Other Ambulatory Visit: Payer: Self-pay

## 2023-08-09 NOTE — Telephone Encounter (Signed)
Noted  

## 2023-08-09 NOTE — Patient Outreach (Signed)
Medicaid Managed Care Social Work Note  08/09/2023 Name:  Craig Schmidt MRN:  [id number] DOB:  1971/05/28  Craig Schmidt is an 53 y.o. year old male who is a primary patient of Craig Fendt, NP.  The [facility] Managed Care Coordination team was consulted for assistance with:  Community Resources   [name] was given information about Medicaid Managed Care Coordination team services today. Craig Schmidt Patient agreed to services and verbal consent obtained.  Engaged with patient  for by telephone forfollow up visit in response to referral for case management and/or care coordination services.   Patient is participating in a Managed Medicaid Plan:  Yes  Assessments/Interventions:  Review of past medical history, allergies, medications, health status, including review of consultants reports, laboratory and other test data, was performed as part of comprehensive evaluation and provision of chronic care management services.  SDOH: (Social Drivers of Health) assessments and interventions performed: SDOH Interventions    Flowsheet Row Telephone from 07/19/2023 in [place] [facility] ED to Hosp-Admission (Discharged) from 07/10/2023 in [facility] to Hosp-Admission (Discharged) from 06/26/2023 in [place] [facility] to Hosp-Admission (Discharged) from 06/19/2023 in [facility] Telephone from 03/07/2023 in [facility] from 01/23/2023 in [facility]  SDOH Interventions        Food Insecurity Interventions AMB Referral Community Resources Provided, Inpatient TOC -- Inpatient TOC, Other (Comment)  [Resources added to AVS] -- --  Housing Interventions AMB Referral Community Resources Provided, Inpatient TOC -- Inpatient TOC, Other (Comment)  [Resource added to AVS]  -- --  Transportation Interventions AMB Referral, Other (Comment)  [Provided patient with non-emergent medical transportation # [phone], [employer]) that his Associate Professor with to provide transportation to medical appointments, advised patient he must call at least 2 days before his scheduled appt] [employer] Provided, [employer], Inpatient TOC, Bus Pass Given [employer] Given Inpatient TOC, Payor Benefit, Other (Comment)  [Resource added to AVS] [employer] Given [employer] Given  Utilities Interventions -- Inpatient TOC, Intervention Not Indicated -- Inpatient TOC, Intervention Not Indicated  [Houseless. Has no utilities] -- --     BSW completed a telephone outreach with patient, he states he was able to get the money for his depsoit from a family member. Patient state he did not receive the resources BSW mailed to him and asked if they could be resent. BSW will resend resources. No other resources are needed at this time. Patient now has [employer].   Advanced Directives Status:  Not addressed in this encounter.  Care Plan                 Allergies  Allergen Reactions   Shellfish Allergy Anaphylaxis   Peanut (Diagnostic) Itching    Medications Reviewed Today   Medications were not reviewed in this encounter     Patient Active Problem List   Diagnosis Date Noted   Coronary artery disease 06/27/2023   History of CVA (cerebrovascular accident) 06/27/2023   Substance use disorder 06/27/2023   Atypical chest pain 06/21/2023   Pulmonary infiltrate 06/21/2023   Multifocal pneumonia 06/11/2023   Hyperkalemia 06/11/2023   Hyponatremia 06/11/2023   Normocytic anemia 06/11/2023   Thrombocytosis 06/11/2023   Troponin I above reference range 05/25/2023   CAP (community acquired pneumonia) 05/25/2023   Pneumonia  05/25/2023   Chronic combined systolic and diastolic CHF (congestive heart failure) (HCC) 12/03/2022   Malnutrition of moderate degree  11/30/2022   Acute on chronic combined systolic and diastolic CHF (congestive heart failure) (HCC) 11/29/2022   Acute on chronic combined systolic (congestive) and diastolic (congestive) heart failure (HCC) 11/28/2022   ACS (acute coronary syndrome) (HCC) 11/28/2022   Back pain 11/28/2022   Essential hypertension 11/28/2022   Hyperlipidemia 11/28/2022   Noncompliance with medication regimen 11/28/2022   ST elevation myocardial infarction (STEMI) of inferolateral wall, subsequent episode of care (HCC) 12/19/2021   ST elevation myocardial infarction (STEMI) (HCC)    Ischemic cardiomyopathy    Cocaine abuse (HCC)    Smoker    Alcohol abuse    CVA (cerebral vascular accident) (HCC) 09/08/2021   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   Tear of MCL (medial collateral ligament) of knee 05/20/2015   Protein-calorie malnutrition, severe 05/17/2015   Lactic acidosis 05/15/2015   Left knee pain 05/15/2015   Lung nodules 05/15/2015   Hypoglycemia 05/14/2015   Hypothermia 05/14/2015   Acute encephalopathy 05/14/2015   Cocaine abuse with intoxication (HCC) 05/14/2015   Alcohol intoxication in active alcoholic (HCC) 05/14/2015   Sepsis (HCC) 05/14/2015   Homeless 05/14/2015    Conditions to be addressed/monitored per PCP order:   housing  There are no care plans that you recently modified to display for this patient.   Follow up:  Patient agrees to Care Plan and Follow-up.  Plan: The  Patient has been provided with contact information for the Managed Medicaid care management team and has been advised to call with any health related questions or concerns.    Abelino Derrick, MHA [facility]  Managed [facility] Social Worker [phone]

## 2023-08-09 NOTE — Patient Instructions (Signed)
 Tailored Plan Medicaid On July 1, some people on [place] Medicaid will move to a new kind of Medicaid health plan called a Tailored Plan. Tailored Plans cover your doctor visits, prescription drugs, and health care services.    If your [place] Medicaid will move to a Tailored Plan, you should have gotten a letter and welcome packet. If you're not sure, call your [place] Medicaid Enrollment Broker at [phone] and ask.  Check out these free materials, in [place] and [place], to learn more about your Tailored Plan: [email or web address]-Plans/Toolkit  Tailored Care Management Services  TCM services are available to you now. If you are a Tailored Plan member or will be and want information about Tailored Care Management Services including rides to appointments and community and home services, call the Care Management provider for your county of residence:    [facility] ([place], [place])  Member Services: [phone] Behavioral Health Crisis Line: 9081880558, [place], [place], [place], [place])  Member Services: 6198549866 Behavioral Health Crisis Line: [phone]  [facility] Renard Hamper) Member Services: [phone] Behavioral Health Crisis Line: [phone]

## 2023-08-09 NOTE — Telephone Encounter (Signed)
Kender called back to thank everyone for their help but states he just found out he now has [employer]. He is going to call [employer] and see if they received the information that was faxed yesterday and if they are part of [employer] as he has the same policy number he says. If there is any problems he states he will call back.

## 2023-08-12 ENCOUNTER — Telehealth: Payer: Self-pay

## 2023-08-12 ENCOUNTER — Other Ambulatory Visit: Payer: Self-pay

## 2023-08-12 DIAGNOSIS — J189 Pneumonia, unspecified organism: Secondary | ICD-10-CM

## 2023-08-12 NOTE — Telephone Encounter (Signed)
Noted  

## 2023-08-12 NOTE — Patient Instructions (Signed)
Visit Information  Dear Craig Schmidt,  Thank you for taking time to visit with me today for our last Transition of Care post-hospitalization follow-up call. It has been a privilege working with you and I am hopeful that you will be able to secure housing in the near future through community resources & assistance. Please don't hesitate to contact me if I can be of assistance to you.   As discussed, I have referred you to a "Longitudinal" Case Manager RN and Social Worker LCSW/BSW for continued assistance with health and housing challenges.  Best Regards,  Elnita Maxwell    Following is a copy of your care plan:   Goals Addressed             This Visit's Progress    COMPLETED: Transition of Care       Current Barriers:  Knowledge Deficits related to plan of care for management of CHF and recent hospitalizations for Pneumonia   RNCM Clinical Goal(s):  Patient will work with the Care Management team over the next 30 days to address Transition of Care Barriers: Medication access Medication Management Diet/Nutrition/Food Resources Support at home Provider appointments Transportation Referral to [employer] to review multiple SDOH concerns  through collaboration with Medical illustrator, provider, and care team.   Interventions: Evaluation of current treatment plan related to  self management and patient's adherence to plan as established by provider   Heart Failure Interventions:  (Status:  Ongoing) Short Term Goal Basic overview and discussion of pathophysiology of Heart Failure reviewed Provided education on low sodium diet Discussed importance of daily weight and advised patient to weigh and record daily Reviewed role of diuretics in prevention of fluid overload and management of heart failure; Discussed the importance of keeping all appointments with provider Provided patient with education about the role of exercise in the management of heart failure Advised patient to  discuss Heart Failure management with provider Assessed social determinant of health barriers  Referred to BSW Gus Puma to discuss ongoing SDOH challenges  Pain Interventions:  (Status:  Ongoing) Short Term Goal Pain assessment performed Medications reviewed Discussed importance of adherence to all scheduled medical appointments Counseled on the importance of reporting any/all new or changed pain symptoms or management strategies to pain management provider Reviewed with patient prescribed pharmacological and nonpharmacological pain relief strategies Advised patient to discuss his request for new prescription for Percocet and to discuss type, location, level of pain and whether continued use of Percocet as needed is best choice or if there are other meds that may help with provider Assessed social determinant of health barriers Assessed type, location, level of pain - patient states his feet are "very painful", feels sharp, burning-like pains, ? consistent with neuropathic type pain.  Patient Goals/Self-Care Activities: Participate in Transition of Care Program/Attend TOC scheduled calls Take all medications as prescribed Attend all scheduled provider appointments Call pharmacy for medication refills 3-7 days in advance of running out of medications Perform all self care activities independently  Call provider office for new concerns or questions  Work with the social worker to address care coordination needs and will continue to work with the clinical team to address health care and disease management related needs  Follow Up Plan:  The patient has been provided with contact information for the care management team and has been advised to call with any health related questions or concerns.          Patient verbalizes understanding of instructions and care  plan provided today and agrees to view in MyChart. Active MyChart status and patient understanding of how to access  instructions and care plan via MyChart confirmed with patient.     The patient has been provided with contact information for the care management team and has been advised to call with any health related questions or concerns.  Agreed to Complex Care Management (CCM) follow up by an [facility] and SWCM.  Please call the care guide team at [phone] if you need to cancel or reschedule your appointment.   Please call 1-800-273-TALK (toll free, 24 hour hotline) if you are experiencing a Mental Health or Behavioral Health Crisis or need someone to talk to.  Alyse Low, RN, BA, [facility], CRRN [facility] Coordinator, Transition of Care Ph # [phone]

## 2023-08-12 NOTE — Patient Outreach (Signed)
Care Management  Transitions of Care Program Managed Medicaid Transitions of Care week 4   08/12/2023 Name: Craig Schmidt MRN: [id number] DOB: 1971/02/23  Subjective: Craig Schmidt is a 53 y.o. year old male who is a primary care patient of Rema Fendt, NP. The Care Management team Engaged with patient Engaged with patient by telephone to assess and address transitions of care needs.   Consent to Services:  Patient was given information about Managed Medicaid Care Management services, agreed to services, and gave verbal consent to participate.   Assessment:           SDOH Interventions    Flowsheet Row Telephone from 08/12/2023 in [place] [facility] Telephone from 07/19/2023 in [place] [facility] ED to Hosp-Admission (Discharged) from 07/10/2023 in [place] [facility] to Hosp-Admission (Discharged) from 06/26/2023 in [place] [facility] ED to Hosp-Admission (Discharged) from 06/19/2023 in [place] [facility] Telephone from 03/07/2023 in [facility]  SDOH Interventions        Food Insecurity Interventions Other (Comment)  [receives food assistance but states it was cut to $60/month due to his disability benefits] AMB Referral Community Resources Provided, Inpatient TOC -- Inpatient TOC, Other (Comment)  [Resources added to AVS] --  Housing Interventions Other (Comment)  [currently remains homeless but is working with social services to obtain housing] AMB Referral [employer] Provided, Inpatient TOC -- Inpatient TOC, Other (Comment)  [Resource added to AVS] --  Transportation Interventions Other (Comment)  [transportation # given to patient for travel to/from appts provided as a benefit from his MM insurer] AMB Referral, Other (Comment)  [Provided patient with non-emergent medical transportation # [phone],  [employer]) that his Associate Professor with to provide transportation to medical appointments, advised patient he must call at least 2 days before his scheduled appt] [employer] Provided, [employer], Inpatient TOC, [employer] Given [employer] Given Inpatient TOC, Payor Benefit, Other (Comment)  [Resource added to AVS] [employer] Given  Utilities Interventions -- -- Inpatient TOC, Intervention Not Indicated -- Inpatient TOC, Intervention Not Indicated  [Houseless. Has no utilities] --        Goals Addressed             This Visit's Progress    COMPLETED: Transition of Care       Current Barriers:  Knowledge Deficits related to plan of care for management of CHF and recent hospitalizations for Pneumonia   RNCM Clinical Goal(s):  Patient will work with the Care Management team over the next 30 days to address Transition of Care Barriers: Medication access Medication Management Diet/Nutrition/Food Resources Support at home Provider appointments Transportation Referral to [employer] to review multiple SDOH concerns  through collaboration with Medical illustrator, provider, and care team.   Interventions: Evaluation of current treatment plan related to  self management and patient's adherence to plan as established by provider   Heart Failure Interventions:  (Status:  Ongoing) Short Term Goal Basic overview and discussion of pathophysiology of Heart Failure reviewed Provided education on low sodium diet Discussed importance of daily weight and advised patient to weigh and record daily Reviewed role of diuretics in prevention of fluid overload and management of heart failure; Discussed the importance of keeping all appointments with provider Provided patient with education about the role of exercise in the management of heart failure Advised patient  to discuss Heart Failure management with provider Assessed social determinant of health barriers  Referred  to BSW Gus Puma to discuss ongoing SDOH challenges  Pain Interventions:  (Status:  Ongoing) Short Term Goal Pain assessment performed Medications reviewed Discussed importance of adherence to all scheduled medical appointments Counseled on the importance of reporting any/all new or changed pain symptoms or management strategies to pain management provider Reviewed with patient prescribed pharmacological and nonpharmacological pain relief strategies Advised patient to discuss his request for new prescription for Percocet and to discuss type, location, level of pain and whether continued use of Percocet as needed is best choice or if there are other meds that may help with provider Assessed social determinant of health barriers Assessed type, location, level of pain - patient states his feet are "very painful", feels sharp, burning-like pains, ? consistent with neuropathic type pain.  Patient Goals/Self-Care Activities: Participate in Transition of Care Program/Attend TOC scheduled calls Take all medications as prescribed Attend all scheduled provider appointments Call pharmacy for medication refills 3-7 days in advance of running out of medications Perform all self care activities independently  Call provider office for new concerns or questions  Work with the social worker to address care coordination needs and will continue to work with the clinical team to address health care and disease management related needs  Follow Up Plan:  The patient has been provided with contact information for the care management team and has been advised to call with any health related questions or concerns.          Plan: The patient has been provided with contact information for the care management team and has been advised to call with any health related questions or concerns.   Alyse Low, RN, BA, [facility], CRRN [facility] Coordinator, Transition of Care Ph # [phone]

## 2023-08-13 NOTE — Telephone Encounter (Signed)
Noted  

## 2023-08-13 NOTE — Patient Outreach (Signed)
Resending resources for patient from 08/09/23 appointment.  Abelino Derrick, MHA [facility]  Managed [facility] Social Worker [phone]

## 2023-08-15 ENCOUNTER — Telehealth: Payer: Self-pay | Admitting: Nurse Practitioner

## 2023-08-15 NOTE — Telephone Encounter (Signed)
Patient is aware of scheduled appointment times/dates for New Patient appointment. Patient is aware to arrive at least 20 min prior for registration/check in

## 2023-08-17 LAB — ACID FAST CULTURE WITH REFLEXED SENSITIVITIES (MYCOBACTERIA): Acid Fast Culture: NEGATIVE

## 2023-08-21 ENCOUNTER — Encounter: Payer: Self-pay | Admitting: Emergency Medicine

## 2023-08-21 ENCOUNTER — Institutional Professional Consult (permissible substitution): Payer: Medicaid Other | Admitting: Emergency Medicine

## 2023-08-27 ENCOUNTER — Telehealth: Payer: Self-pay | Admitting: Hematology

## 2023-08-27 NOTE — Telephone Encounter (Signed)
Rescheduled appointments per provider request. Patient is aware of the changes made.

## 2023-09-03 ENCOUNTER — Other Ambulatory Visit: Payer: Self-pay

## 2023-09-03 ENCOUNTER — Emergency Department (HOSPITAL_COMMUNITY)
Admission: EM | Admit: 2023-09-03 | Discharge: 2023-09-03 | Discharge status: Against Medical Advice | Payer: MEDICAID | Source: Home / Self Care | Attending: Emergency Medicine | Admitting: Emergency Medicine

## 2023-09-03 ENCOUNTER — Emergency Department (HOSPITAL_COMMUNITY): Payer: MEDICAID

## 2023-09-03 ENCOUNTER — Encounter (HOSPITAL_COMMUNITY): Payer: Self-pay

## 2023-09-03 ENCOUNTER — Inpatient Hospital Stay (HOSPITAL_COMMUNITY)
Admission: EM | Admit: 2023-09-03 | Discharge: 2023-09-20 | DRG: 065 | Discharge status: Against Medical Advice | Payer: MEDICAID | Attending: Family Medicine | Admitting: Family Medicine

## 2023-09-03 DIAGNOSIS — I959 Hypotension, unspecified: Secondary | ICD-10-CM | POA: Diagnosis not present

## 2023-09-03 DIAGNOSIS — I429 Cardiomyopathy, unspecified: Secondary | ICD-10-CM | POA: Diagnosis present

## 2023-09-03 DIAGNOSIS — J45909 Unspecified asthma, uncomplicated: Secondary | ICD-10-CM | POA: Diagnosis present

## 2023-09-03 DIAGNOSIS — Z7982 Long term (current) use of aspirin: Secondary | ICD-10-CM

## 2023-09-03 DIAGNOSIS — I2510 Atherosclerotic heart disease of native coronary artery without angina pectoris: Secondary | ICD-10-CM | POA: Diagnosis present

## 2023-09-03 DIAGNOSIS — R471 Dysarthria and anarthria: Secondary | ICD-10-CM | POA: Diagnosis present

## 2023-09-03 DIAGNOSIS — N189 Chronic kidney disease, unspecified: Secondary | ICD-10-CM | POA: Insufficient documentation

## 2023-09-03 DIAGNOSIS — F1010 Alcohol abuse, uncomplicated: Secondary | ICD-10-CM | POA: Diagnosis present

## 2023-09-03 DIAGNOSIS — Z79899 Other long term (current) drug therapy: Secondary | ICD-10-CM

## 2023-09-03 DIAGNOSIS — I130 Hypertensive heart and chronic kidney disease with heart failure and stage 1 through stage 4 chronic kidney disease, or unspecified chronic kidney disease: Secondary | ICD-10-CM | POA: Insufficient documentation

## 2023-09-03 DIAGNOSIS — Z7901 Long term (current) use of anticoagulants: Secondary | ICD-10-CM | POA: Insufficient documentation

## 2023-09-03 DIAGNOSIS — Z91148 Patient's other noncompliance with medication regimen for other reason: Secondary | ICD-10-CM

## 2023-09-03 DIAGNOSIS — R29708 NIHSS score 8: Secondary | ICD-10-CM | POA: Diagnosis present

## 2023-09-03 DIAGNOSIS — R42 Dizziness and giddiness: Secondary | ICD-10-CM | POA: Insufficient documentation

## 2023-09-03 DIAGNOSIS — I5082 Biventricular heart failure: Secondary | ICD-10-CM | POA: Diagnosis present

## 2023-09-03 DIAGNOSIS — I5022 Chronic systolic (congestive) heart failure: Secondary | ICD-10-CM | POA: Diagnosis present

## 2023-09-03 DIAGNOSIS — N179 Acute kidney failure, unspecified: Secondary | ICD-10-CM | POA: Diagnosis present

## 2023-09-03 DIAGNOSIS — R7303 Prediabetes: Secondary | ICD-10-CM | POA: Diagnosis present

## 2023-09-03 DIAGNOSIS — Z5329 Procedure and treatment not carried out because of patient's decision for other reasons: Secondary | ICD-10-CM | POA: Diagnosis present

## 2023-09-03 DIAGNOSIS — Z833 Family history of diabetes mellitus: Secondary | ICD-10-CM

## 2023-09-03 DIAGNOSIS — I5020 Unspecified systolic (congestive) heart failure: Secondary | ICD-10-CM | POA: Diagnosis present

## 2023-09-03 DIAGNOSIS — Z7984 Long term (current) use of oral hypoglycemic drugs: Secondary | ICD-10-CM

## 2023-09-03 DIAGNOSIS — I342 Nonrheumatic mitral (valve) stenosis: Secondary | ICD-10-CM | POA: Diagnosis present

## 2023-09-03 DIAGNOSIS — R531 Weakness: Secondary | ICD-10-CM | POA: Insufficient documentation

## 2023-09-03 DIAGNOSIS — H547 Unspecified visual loss: Secondary | ICD-10-CM | POA: Diagnosis present

## 2023-09-03 DIAGNOSIS — R410 Disorientation, unspecified: Secondary | ICD-10-CM | POA: Insufficient documentation

## 2023-09-03 DIAGNOSIS — I509 Heart failure, unspecified: Secondary | ICD-10-CM | POA: Insufficient documentation

## 2023-09-03 DIAGNOSIS — I69351 Hemiplegia and hemiparesis following cerebral infarction affecting right dominant side: Secondary | ICD-10-CM

## 2023-09-03 DIAGNOSIS — I4891 Unspecified atrial fibrillation: Secondary | ICD-10-CM | POA: Diagnosis present

## 2023-09-03 DIAGNOSIS — I639 Cerebral infarction, unspecified: Principal | ICD-10-CM | POA: Diagnosis present

## 2023-09-03 DIAGNOSIS — F17210 Nicotine dependence, cigarettes, uncomplicated: Secondary | ICD-10-CM | POA: Diagnosis present

## 2023-09-03 DIAGNOSIS — I252 Old myocardial infarction: Secondary | ICD-10-CM

## 2023-09-03 DIAGNOSIS — Z8673 Personal history of transient ischemic attack (TIA), and cerebral infarction without residual deficits: Secondary | ICD-10-CM

## 2023-09-03 DIAGNOSIS — I10 Essential (primary) hypertension: Secondary | ICD-10-CM | POA: Diagnosis present

## 2023-09-03 DIAGNOSIS — Z23 Encounter for immunization: Secondary | ICD-10-CM

## 2023-09-03 DIAGNOSIS — R29810 Facial weakness: Secondary | ICD-10-CM | POA: Diagnosis present

## 2023-09-03 DIAGNOSIS — Z20822 Contact with and (suspected) exposure to covid-19: Secondary | ICD-10-CM | POA: Insufficient documentation

## 2023-09-03 DIAGNOSIS — Z91013 Allergy to seafood: Secondary | ICD-10-CM

## 2023-09-03 DIAGNOSIS — Z5900 Homelessness unspecified: Secondary | ICD-10-CM

## 2023-09-03 DIAGNOSIS — I63511 Cerebral infarction due to unspecified occlusion or stenosis of right middle cerebral artery: Principal | ICD-10-CM | POA: Diagnosis present

## 2023-09-03 DIAGNOSIS — Z91010 Allergy to peanuts: Secondary | ICD-10-CM | POA: Insufficient documentation

## 2023-09-03 DIAGNOSIS — F1410 Cocaine abuse, uncomplicated: Secondary | ICD-10-CM | POA: Diagnosis present

## 2023-09-03 DIAGNOSIS — R55 Syncope and collapse: Secondary | ICD-10-CM | POA: Insufficient documentation

## 2023-09-03 DIAGNOSIS — G4089 Other seizures: Secondary | ICD-10-CM | POA: Diagnosis not present

## 2023-09-03 DIAGNOSIS — I618 Other nontraumatic intracerebral hemorrhage: Secondary | ICD-10-CM | POA: Diagnosis present

## 2023-09-03 DIAGNOSIS — E785 Hyperlipidemia, unspecified: Secondary | ICD-10-CM | POA: Diagnosis present

## 2023-09-03 DIAGNOSIS — N1831 Chronic kidney disease, stage 3a: Secondary | ICD-10-CM | POA: Diagnosis present

## 2023-09-03 DIAGNOSIS — E440 Moderate protein-calorie malnutrition: Secondary | ICD-10-CM | POA: Diagnosis present

## 2023-09-03 DIAGNOSIS — R1310 Dysphagia, unspecified: Secondary | ICD-10-CM | POA: Diagnosis present

## 2023-09-03 DIAGNOSIS — Z1152 Encounter for screening for COVID-19: Secondary | ICD-10-CM

## 2023-09-03 LAB — URINALYSIS, ROUTINE W REFLEX MICROSCOPIC
Bacteria, UA: NONE SEEN
Bilirubin Urine: NEGATIVE
Glucose, UA: NEGATIVE mg/dL
Hgb urine dipstick: NEGATIVE
Ketones, ur: NEGATIVE mg/dL
Leukocytes,Ua: NEGATIVE
Nitrite: NEGATIVE
Protein, ur: 100 mg/dL — AB
Specific Gravity, Urine: 1.018 (ref 1.005–1.030)
pH: 5 (ref 5.0–8.0)

## 2023-09-03 LAB — CBC WITH DIFFERENTIAL/PLATELET
Abs Immature Granulocytes: 0.02 10*3/uL (ref 0.00–0.07)
Basophils Absolute: 0 10*3/uL (ref 0.0–0.1)
Basophils Relative: 1 %
Eosinophils Absolute: 0.3 10*3/uL (ref 0.0–0.5)
Eosinophils Relative: 3 %
HCT: 37.2 % — ABNORMAL LOW (ref 39.0–52.0)
Hemoglobin: 11.3 g/dL — ABNORMAL LOW (ref 13.0–17.0)
Immature Granulocytes: 0 %
Lymphocytes Relative: 21 %
Lymphs Abs: 1.7 10*3/uL (ref 0.7–4.0)
MCH: 28 pg (ref 26.0–34.0)
MCHC: 30.4 g/dL (ref 30.0–36.0)
MCV: 92.3 fL (ref 80.0–100.0)
Monocytes Absolute: 0.4 10*3/uL (ref 0.1–1.0)
Monocytes Relative: 5 %
Neutro Abs: 5.7 10*3/uL (ref 1.7–7.7)
Neutrophils Relative %: 70 %
Platelets: 235 10*3/uL (ref 150–400)
RBC: 4.03 MIL/uL — ABNORMAL LOW (ref 4.22–5.81)
RDW: 16.3 % — ABNORMAL HIGH (ref 11.5–15.5)
WBC: 8.1 10*3/uL (ref 4.0–10.5)
nRBC: 0 % (ref 0.0–0.2)

## 2023-09-03 LAB — RAPID URINE DRUG SCREEN, HOSP PERFORMED
Amphetamines: NOT DETECTED
Barbiturates: NOT DETECTED
Benzodiazepines: NOT DETECTED
Cocaine: POSITIVE — AB
Opiates: NOT DETECTED
Tetrahydrocannabinol: NOT DETECTED

## 2023-09-03 LAB — COMPREHENSIVE METABOLIC PANEL
ALT: 8 U/L (ref 0–44)
AST: 28 U/L (ref 15–41)
Albumin: 3.4 g/dL — ABNORMAL LOW (ref 3.5–5.0)
Alkaline Phosphatase: 115 U/L (ref 38–126)
Anion gap: 11 (ref 5–15)
BUN: 18 mg/dL (ref 6–20)
CO2: 20 mmol/L — ABNORMAL LOW (ref 22–32)
Calcium: 9.2 mg/dL (ref 8.9–10.3)
Chloride: 106 mmol/L (ref 98–111)
Creatinine, Ser: 1.26 mg/dL — ABNORMAL HIGH (ref 0.61–1.24)
GFR, Estimated: >60 mL/min (ref >60)
Glucose, Bld: 97 mg/dL (ref 70–99)
Potassium: 4.2 mmol/L (ref 3.5–5.1)
Sodium: 137 mmol/L (ref 135–145)
Total Bilirubin: 1.1 mg/dL (ref 0.0–1.2)
Total Protein: 7.3 g/dL (ref 6.5–8.1)

## 2023-09-03 LAB — RESP PANEL BY RT-PCR (RSV, FLU A&B, COVID)  RVPGX2
Influenza A by PCR: NEGATIVE
Influenza B by PCR: NEGATIVE
Resp Syncytial Virus by PCR: NEGATIVE
SARS Coronavirus 2 by RT PCR: NEGATIVE

## 2023-09-03 LAB — TROPONIN I (HIGH SENSITIVITY)
Troponin I (High Sensitivity): 639 ng/L (ref <18)
Troponin I (High Sensitivity): 444 ng/L (ref <18)

## 2023-09-03 LAB — CBG MONITORING, ED: Glucose-Capillary: 76 mg/dL (ref 70–99)

## 2023-09-03 LAB — BRAIN NATRIURETIC PEPTIDE: B Natriuretic Peptide: 2078.8 pg/mL — ABNORMAL HIGH (ref 0.0–100.0)

## 2023-09-03 LAB — ETHANOL: Alcohol, Ethyl (B): <10 mg/dL (ref <10)

## 2023-09-03 MED ORDER — LORAZEPAM 2 MG/ML IJ SOLN
1.0000 mg | Freq: Once | INTRAMUSCULAR | Status: AC
  Administered 2023-09-03: 1 mg via INTRAMUSCULAR
  Filled 2023-09-03: qty 1

## 2023-09-03 MED ORDER — SODIUM CHLORIDE 0.9 % IV BOLUS
1000.0000 mL | Freq: Once | INTRAVENOUS | Status: AC
  Administered 2023-09-03: 1000 mL via INTRAVENOUS

## 2023-09-03 MED ORDER — ASPIRIN 81 MG PO CHEW
324.0000 mg | CHEWABLE_TABLET | Freq: Once | ORAL | Status: AC
  Administered 2023-09-03: 324 mg via ORAL
  Filled 2023-09-03: qty 4

## 2023-09-03 NOTE — Discharge Instructions (Signed)
Follow up with your cardiologist next week.  Return sooner if problems

## 2023-09-03 NOTE — ED Triage Notes (Signed)
Pt was seen earlier for same and says doctor recommended an MRI to r/o stroke, but pt declined and left. Now pt is back requesting an MRI.

## 2023-09-03 NOTE — ED Provider Notes (Signed)
Patient with a near syncopal episode.  Patient has a history of heart failure and an NSTEMI and a stroke.  His first troponin was 639 and his second troponin was 444.  I spoke with Dr. Anne Fu cardiology and he felt like the patient could follow-up as an outpatient.  Patient wants to go home and will be discharged   Bethann Berkshire, MD 09/03/23 2012

## 2023-09-03 NOTE — ED Provider Notes (Incomplete)
  [facility]-EMERGENCY DEPT [facility] Emergency Department Provider Note MRN:  [id number]  Arrival date & time: 09/03/23     Chief Complaint   Weakness   History of Present Illness   Craig Schmidt is a 53 y.o. year-old male presents to the ED with chief complaint of ***.  {rbhistorian:27070} {RB interpreter (Optional):27221}  Review of Systems  Pertinent positive and negative review of systems noted in HPI.    Physical Exam   Vitals:   09/03/23 2140  BP: (!) 157/118  Pulse: 100  Resp: 16  Temp: 97.6 F (36.4 C)  SpO2: 96%    CONSTITUTIONAL:  ***-appearing, NAD NEURO:  Alert and oriented x 3, CN 3-12 grossly intact*** EYES:  eyes equal and reactive ENT/NECK:  Supple, no stridor *** CARDIO:  ***, ***regular rhythm, appears well-perfused *** PULM:  No respiratory distress***, *** GI/GU:  non-distended, *** MSK/SPINE:  No gross deformities, no edema, moves all extremities *** SKIN:  no rash, atraumatic***   *Additional and/or pertinent findings included in MDM below  Diagnostic and Interventional Summary    EKG Interpretation Date/Time:    Ventricular Rate:    PR Interval:    QRS Duration:    QT Interval:    QTC Calculation:   R Axis:      Text Interpretation:        *** Labs Reviewed - No data to display  MR BRAIN WO CONTRAST  Final Result      Medications  LORazepam (ATIVAN) injection 1 mg (1 mg Intramuscular Given 09/03/23 2210)     Procedures  /  Critical Care Procedures  ED Course and Medical Decision Making  I have reviewed the triage vital signs, the nursing notes, and pertinent available records from the EMR.  Social Determinants Affecting Complexity of Care: Patient {rbSocial Determinants:27067}. {rbsocialsolutions:27068}  ED Course:    Medical Decision Making     {rbcpddx (Optional):29772:::1} {rbabdddx (Optional):29773:s::1}  Consultants: {rbconsultants:27072}   Treatment and  Plan: {rbadmissionvdc:27069}  {rbattending:27073}  Final Clinical Impressions(s) / ED Diagnoses  No diagnosis found.  ED Discharge Orders     None         Discharge Instructions Discussed with and Provided to Patient:   Discharge Instructions   None

## 2023-09-03 NOTE — ED Triage Notes (Addendum)
Biba. Called out for pt feeling overheated. On arrival reported weakness. Could not walk on arrival which he is usually able to ambulate. Hx of old stroke. Left side deficits from old stroke per EMS and patient. Patient able to grip with both hands equally. Denies pain, numbness or tingling.

## 2023-09-03 NOTE — ED Notes (Signed)
Pt signed AMA and left ED

## 2023-09-03 NOTE — ED Provider Triage Note (Signed)
Emergency Medicine Provider Triage Evaluation Note  DAMONTA COSSEY , a 53 y.o. male  was evaluated in triage.  Pt complains of weakness.  Patient was seen earlier today for the same concern.  Was advised to get a MRI but declined at the time.  Is back because he would like 1 now.  No new or worsening weakness at this time.  Review of Systems  Positive: Left-sided weakness Negative: Loss of consciousness  Physical Exam  BP (!) 157/118 (BP Location: Left Arm)   Pulse 100   Temp 97.6 F (36.4 C) (Oral)   Resp 16   SpO2 96%  Gen:   Awake, no distress   Resp:  Normal effort  MSK:   Moves extremities without difficulty  Other:    Medical Decision Making  Medically screening exam initiated at 9:51 PM.  Appropriate orders placed.  Pacer Dorn Hallahan was informed that the remainder of the evaluation will be completed by another provider, this initial triage assessment does not replace that evaluation, and the importance of remaining in the ED until their evaluation is complete.     Maxwell Marion, [name] 09/03/23 2152

## 2023-09-03 NOTE — ED Provider Notes (Signed)
[facility] Emergency Department Provider Note MRN:  [id number]  Arrival date & time: 09/04/23     Chief Complaint   Weakness   History of Present Illness   Craig Schmidt is a 53 y.o. year-old male presents to the ED with chief complaint of left sided weakness.  Patient states that he was in line at a food pantry today around noon and began having left arm and leg weakness.  He was also noted to have slurred speech by his significant other.  He does have history of strokes.  Patient is uncertain what medications he takes, but has Eliquis listed prior cardioembolic strokes.  He also has history of cocaine use.  Patient was seen earlier today for the symptoms, but apparently had resolution of the symptoms.  He was recommended to have MRI, but ended up leaving AGAINST MEDICAL ADVICE.  Laboratory workup was performed earlier and was not repeated.  Patient returned tonight to have MRI of his brain.  History provided by patient.   Review of Systems  Pertinent positive and negative review of systems noted in HPI.    Physical Exam   Vitals:   09/03/23 2140  BP: (!) 157/118  Pulse: 100  Resp: 16  Temp: 97.6 F (36.4 C)  SpO2: 96%    CONSTITUTIONAL: Nontoxic-appearing, NAD NEURO:  Alert and oriented x 3, CN 3-12 grossly intact, diminished strength in left upper extremity when compared to right EYES:  eyes equal and reactive ENT/NECK:  Supple, no stridor CARDIO: Normal rate, regular rhythm, appears well-perfused PULM:  No respiratory distress, clear to auscultation GI/GU:  non-distended, MSK/SPINE:  No gross deformities, no edema, moves all extremities SKIN:  no rash, atraumatic   *Additional and/or pertinent findings included in MDM below  Diagnostic and Interventional Summary    EKG Interpretation Date/Time:    Ventricular Rate:    PR Interval:    QRS Duration:    QT Interval:    QTC Calculation:   R Axis:      Text Interpretation:          Labs Reviewed - No data to display  MR BRAIN WO CONTRAST  Final Result      Medications  LORazepam (ATIVAN) injection 1 mg (1 mg Intramuscular Given 09/03/23 2210)  aspirin chewable tablet 324 mg (324 mg Oral Given 09/03/23 2359)     Procedures  /  Critical Care .Critical Care  Performed by: Roxy Horseman, [name] Authorized by: Roxy Horseman, [name]   Critical care provider statement:    Critical care time (minutes):  44   Critical care was necessary to treat or prevent imminent or life-threatening deterioration of the following conditions:  CNS failure or compromise   Critical care was time spent personally by me on the following activities:  Development of treatment plan with patient or surrogate, discussions with consultants, evaluation of patient's response to treatment, examination of patient, ordering and review of laboratory studies, ordering and review of radiographic studies, ordering and performing treatments and interventions, pulse oximetry, re-evaluation of patient's condition and review of old charts   ED Course and Medical Decision Making  I have reviewed the triage vital signs, the nursing notes, and pertinent available records from the EMR.  Social Determinants Affecting Complexity of Care: Patient has no clinically significant social determinants affecting this chief complaint..   ED Course:    Medical Decision Making Patient here for MRI.  Was seen earlier today for left arm and leg weakness.  Apparently  had resolution of symptoms, but was recommended for MRI.  Patient declined and left AGAINST MEDICAL ADVICE.  He returns tonight to have his MRI and complete his workup.  MRI ordered in triage is notable for acute cortical and subcortical infarcts.  I called and discussed case with Dr. Amada Jupiter of neurology.  His recommendations are listed below.  Patient will need admission to [facility].  Patient had labs done earlier today.  He did have  elevated troponin, which trended flat.  Cardiology was consulted earlier, and patient was felt stable for discharge and outpatient follow-up from cardiology standpoint.  Risk OTC drugs. Decision regarding hospitalization.         Consultants: I consulted with Dr. Amada Jupiter, who recommends 324 mg of aspirin, holding Eliquis, and transfer to [facility] for admission and neurology consult.  I consulted with Dr. Joneen Roach, hospitalist, who is appreciated for admitting.   Treatment and Plan: Patient's exam and diagnostic results are concerning for stroke.  Feel that patient will need admission to the hospital for further treatment and evaluation.    Final Clinical Impressions(s) / ED Diagnoses     ICD-10-CM   1. Cerebrovascular accident (CVA), unspecified mechanism (HCC)  I63.9       ED Discharge Orders     None         Discharge Instructions Discussed with and Provided to Patient:   Discharge Instructions   None      Roxy Horseman, [name] 09/04/23 0100    Ernie Avena, MD 09/04/23 [id number]

## 2023-09-03 NOTE — ED Provider Notes (Signed)
When patient with being discharged, his significant other stated that his left arm was not acting correctly.  I examined the patient and he had some mild weakness in the left hand and arm.  According to his significant other he had been having problems with that arm for at least 6 hours.  I spoke to the patient and told them we should get an MRI of the brain.  He did not want to get any more tests and he wanted to be discharged home.  I told the patient he may have had another stroke.  He still refused to stay and getting more studies   Bethann Berkshire, MD 09/03/23 2051

## 2023-09-03 NOTE — ED Notes (Addendum)
Patient "girlfriend of 7 years" at nurses station emotional crying with tears stating patient was put up for discharge but she thinks something is wrong and that he may have had another stroke. She has d/c paperwork present in her hand. Nurse to look over d/c paperwork at this time. Nurse reassured girlfriend that we will address her concern as this time. She stated that patient is unable to use his left hand at this time with gripping or holding and also experiencing some loss of sensation in the arm and couldn't feel putting his coat on. She stated this change in his left extremity has happened since he has been here in the ER today. Nurse to Provider Zammit to inform him of girlfriend concern and provider to speak and re assess patient at this time. Primary nurse informed provider will order for patient to have a MRI of his brain and to hold off on d/c at this time.

## 2023-09-03 NOTE — ED Notes (Signed)
Unsuccessful IV stick X 2. Pt reports that he usually needs an ultrasound line. Put in IV team consult.

## 2023-09-03 NOTE — ED Provider Notes (Signed)
[place] [facility]ERGENC[facility] DEPARTMENT AT [facility] Provider Note   CSN: [id number] Arrival date & time: 09/03/23  1335     History  Chief Complaint  Patient presents with   Weakness    Craig Schmidt is a 52 y.o. male.  With a past medical history of CVA, STEMI, CKD, HFrEF, hypertension and polysubstance use who presents to the ED after near syncopal episode.  Patient was standing in the food line with his partner earlier today when he became very lightheaded, flushed and needed to sit down.  His symptoms improved shortly after being seated and he did not lose consciousness.  Partner noted him to be confused directly after the episode.  There was no seizure activity.  No focal weakness, changes in speech.  He does have residual right-sided weakness from his prior stroke.  Here in the ED he states he still feels a little flushed but well overall.  Denies headaches, chest pain, nausea, vomiting, fevers chills recent illness.   Weakness      Home Medications Prior to Admission medications   Medication Sig Start Date End Date Taking? Authorizing Provider  apixaban (ELIQUIS) 5 MG TABS tablet Take 1 tablet (5 mg total) by mouth 2 (two) times daily. 07/14/23   Meredeth Ide, MD  aspirin EC 81 MG tablet Take 1 tablet (81 mg total) by mouth daily. Swallow whole. 07/15/23   Meredeth Ide, MD  atorvastatin (LIPITOR) 40 MG tablet Take 1 tablet (40 mg total) by mouth daily. 06/10/23   Marinda Elk, MD  carvedilol (COREG) 3.125 MG tablet Take 1 tablet (3.125 mg total) by mouth 2 (two) times daily. 06/10/23   Marinda Elk, MD  empagliflozin (JARDIANCE) 10 MG TABS tablet Take 1 tablet (10 mg total) by mouth daily. 07/14/23   Meredeth Ide, MD  furosemide (LASIX) 40 MG tablet Take 1 tablet (40 mg total) by mouth daily. 07/14/23 08/14/23  Meredeth Ide, MD  nicotine polacrilex (NICORETTE) 2 MG gum Take 1 each (2 mg total) by mouth as needed for smoking cessation. 07/14/23    Meredeth Ide, MD  oxyCODONE-acetaminophen (PERCOCET) 5-325 MG tablet Take 1 tablet by mouth every 6 (six) hours as needed. Patient not taking: Reported on 08/12/2023 07/10/23   Bethann Berkshire, MD  sacubitril-valsartan (ENTRESTO) 24-26 MG Take 1 tablet by mouth 2 (two) times daily. 07/15/23   Amin, Ankit C, MD  spironolactone (ALDACTONE) 25 MG tablet Take 0.5 tablets (12.5 mg total) by mouth daily. 07/15/23   Meredeth Ide, MD  triamcinolone (KENALOG) 0.025 % cream Apply 1 Application topically 2 (two) times daily. 07/29/23   Rema Fendt, NP      Allergies    Shellfish allergy and Peanut (diagnostic)    Review of Systems   Review of Systems  Neurological:  Positive for weakness.    Physical Exam Updated Vital Signs BP (!) 154/109   Pulse 98   Temp 98.1 F (36.7 C) (Oral)   Resp 16   Ht 5\' 7"  (1.702 m)   Wt 65.9 kg   SpO2 95%   BMI 22.75 kg/m  Physical Exam Vitals and nursing note reviewed.  HENT:     Head: Normocephalic and atraumatic.  Eyes:     Pupils: Pupils are equal, round, and reactive to light.  Cardiovascular:     Rate and Rhythm: Normal rate and regular rhythm.  Pulmonary:     Effort: Pulmonary effort is normal.  Breath sounds: Normal breath sounds.  Abdominal:     Palpations: Abdomen is soft.     Tenderness: There is no abdominal tenderness.  Skin:    General: Skin is warm and dry.  Neurological:     Mental Status: He is alert.     Comments: 5 out of 5 motor strength in the left upper and left lower extremity 4-5 motor strength in the right upper and right lower extremity, baseline Clear fluent speech No facial asymmetry No visual field deficit Awake alert oriented x 4  Psychiatric:        Mood and Affect: Mood normal.     ED Results / Procedures / Treatments   Labs (all labs ordered are listed, but only abnormal results are displayed) Labs Reviewed  RESP PANEL BY RT-PCR (RSV, FLU A&B, COVID)  [id number]  COMPREHENSIVE METABOLIC PANEL  CBC WITH  DIFFERENTIAL/PLATELET  URINALYSIS, ROUTINE W REFLEX MICROSCOPIC  BRAIN NATRIURETIC PEPTIDE  ETHANOL  RAPID URINE DRUG SCREEN, HOSP PERFORMED  CBG MONITORING, ED  TROPONIN I (HIGH SENSITIVITY)    EKG EKG Interpretation Date/Time:  Tuesday September 03 2023 14:05:22 EST Ventricular Rate:  100 PR Interval:  141 QRS Duration:  115 QT Interval:  402 QTC Calculation: 519 R Axis:   73  Text Interpretation: Sinus tachycardia Consider right atrial enlargement Nonspecific intraventricular conduction delay Nonspecific repol abnormality, lateral leads Confirmed by Estelle June [phone]) on 09/03/2023 2:29:19 PM  Radiology No results found.  Procedures Procedures    Medications Ordered in ED Medications  sodium chloride 0.9 % bolus 1,000 mL (has no administration in time range)    ED Course/ Medical Decision Making/ A&P Clinical Course as of 09/03/23 1558  Tue Sep 03, 2023  1557 I, Estelle June DO, am transitioning care of this patient to the oncoming provider pending laboratory workup, reevaluation and disposition [MP]    Clinical Course User Index [MP] Royanne Foots, DO                                 Medical Decision Making 53 year old male with history as above presenting after near syncopal episode at the food pantry.  No seizure activity or focal neurodeficit.  Patient feels a bit flushed but otherwise is asymptomatic.  He has not been sick recently.  Considering his extensive medical history we will obtain laboratory workup along with chest x-ray UA EKG and continue to monitor on cardiac monitor.  Most likely this sounds like a near syncopal event due to prolonged standing in line however other differential to consider would be dysrhythmia, acute heart failure, pneumonia, UTI, anemia or electrolyte imbalance.  Low suspicion for acute stroke at this time given that he is at his neurologic baseline without new focal neurologic deficits or headache.  Amount and/or Complexity of  Data Reviewed Labs: ordered. Radiology: ordered.           Final Clinical Impression(s) / ED Diagnoses Final diagnoses:  Near syncope    Rx / DC Orders ED Discharge Orders     None         Royanne Foots, DO 09/03/23 1558

## 2023-09-04 ENCOUNTER — Inpatient Hospital Stay: Payer: Medicaid Other | Admitting: Hematology

## 2023-09-04 ENCOUNTER — Inpatient Hospital Stay: Payer: Medicaid Other

## 2023-09-04 ENCOUNTER — Inpatient Hospital Stay (HOSPITAL_COMMUNITY): Payer: MEDICAID

## 2023-09-04 ENCOUNTER — Inpatient Hospital Stay: Payer: Medicaid Other | Admitting: Nurse Practitioner

## 2023-09-04 ENCOUNTER — Encounter (HOSPITAL_COMMUNITY): Payer: Self-pay | Admitting: Family Medicine

## 2023-09-04 DIAGNOSIS — I252 Old myocardial infarction: Secondary | ICD-10-CM | POA: Diagnosis not present

## 2023-09-04 DIAGNOSIS — R569 Unspecified convulsions: Secondary | ICD-10-CM | POA: Diagnosis not present

## 2023-09-04 DIAGNOSIS — I639 Cerebral infarction, unspecified: Secondary | ICD-10-CM

## 2023-09-04 DIAGNOSIS — I5022 Chronic systolic (congestive) heart failure: Secondary | ICD-10-CM | POA: Diagnosis present

## 2023-09-04 DIAGNOSIS — N1831 Chronic kidney disease, stage 3a: Secondary | ICD-10-CM | POA: Diagnosis present

## 2023-09-04 DIAGNOSIS — I4891 Unspecified atrial fibrillation: Secondary | ICD-10-CM | POA: Diagnosis present

## 2023-09-04 DIAGNOSIS — I130 Hypertensive heart and chronic kidney disease with heart failure and stage 1 through stage 4 chronic kidney disease, or unspecified chronic kidney disease: Secondary | ICD-10-CM | POA: Diagnosis present

## 2023-09-04 DIAGNOSIS — F17210 Nicotine dependence, cigarettes, uncomplicated: Secondary | ICD-10-CM | POA: Diagnosis present

## 2023-09-04 DIAGNOSIS — R4182 Altered mental status, unspecified: Secondary | ICD-10-CM | POA: Diagnosis present

## 2023-09-04 DIAGNOSIS — I618 Other nontraumatic intracerebral hemorrhage: Secondary | ICD-10-CM | POA: Diagnosis present

## 2023-09-04 DIAGNOSIS — F1010 Alcohol abuse, uncomplicated: Secondary | ICD-10-CM | POA: Diagnosis present

## 2023-09-04 DIAGNOSIS — E785 Hyperlipidemia, unspecified: Secondary | ICD-10-CM | POA: Diagnosis present

## 2023-09-04 DIAGNOSIS — G8194 Hemiplegia, unspecified affecting left nondominant side: Secondary | ICD-10-CM | POA: Diagnosis not present

## 2023-09-04 DIAGNOSIS — Z8673 Personal history of transient ischemic attack (TIA), and cerebral infarction without residual deficits: Secondary | ICD-10-CM

## 2023-09-04 DIAGNOSIS — I342 Nonrheumatic mitral (valve) stenosis: Secondary | ICD-10-CM | POA: Diagnosis present

## 2023-09-04 DIAGNOSIS — R7303 Prediabetes: Secondary | ICD-10-CM | POA: Diagnosis present

## 2023-09-04 DIAGNOSIS — F1410 Cocaine abuse, uncomplicated: Secondary | ICD-10-CM

## 2023-09-04 DIAGNOSIS — Z5900 Homelessness unspecified: Secondary | ICD-10-CM | POA: Diagnosis not present

## 2023-09-04 DIAGNOSIS — I5020 Unspecified systolic (congestive) heart failure: Secondary | ICD-10-CM

## 2023-09-04 DIAGNOSIS — G4089 Other seizures: Secondary | ICD-10-CM | POA: Diagnosis not present

## 2023-09-04 DIAGNOSIS — I63511 Cerebral infarction due to unspecified occlusion or stenosis of right middle cerebral artery: Secondary | ICD-10-CM | POA: Diagnosis present

## 2023-09-04 DIAGNOSIS — I10 Essential (primary) hypertension: Secondary | ICD-10-CM | POA: Diagnosis not present

## 2023-09-04 DIAGNOSIS — Z23 Encounter for immunization: Secondary | ICD-10-CM | POA: Diagnosis not present

## 2023-09-04 DIAGNOSIS — I429 Cardiomyopathy, unspecified: Secondary | ICD-10-CM | POA: Diagnosis present

## 2023-09-04 DIAGNOSIS — J45909 Unspecified asthma, uncomplicated: Secondary | ICD-10-CM | POA: Diagnosis present

## 2023-09-04 DIAGNOSIS — R252 Cramp and spasm: Secondary | ICD-10-CM | POA: Diagnosis not present

## 2023-09-04 DIAGNOSIS — I69351 Hemiplegia and hemiparesis following cerebral infarction affecting right dominant side: Secondary | ICD-10-CM | POA: Diagnosis not present

## 2023-09-04 DIAGNOSIS — Z1152 Encounter for screening for COVID-19: Secondary | ICD-10-CM | POA: Diagnosis not present

## 2023-09-04 DIAGNOSIS — I5082 Biventricular heart failure: Secondary | ICD-10-CM | POA: Diagnosis present

## 2023-09-04 DIAGNOSIS — N179 Acute kidney failure, unspecified: Secondary | ICD-10-CM | POA: Diagnosis present

## 2023-09-04 DIAGNOSIS — I2510 Atherosclerotic heart disease of native coronary artery without angina pectoris: Secondary | ICD-10-CM | POA: Diagnosis present

## 2023-09-04 DIAGNOSIS — Z720 Tobacco use: Secondary | ICD-10-CM

## 2023-09-04 DIAGNOSIS — R1310 Dysphagia, unspecified: Secondary | ICD-10-CM | POA: Diagnosis present

## 2023-09-04 LAB — CBC
HCT: 32.8 % — ABNORMAL LOW (ref 39.0–52.0)
Hemoglobin: 10.5 g/dL — ABNORMAL LOW (ref 13.0–17.0)
MCH: 28.5 pg (ref 26.0–34.0)
MCHC: 32 g/dL (ref 30.0–36.0)
MCV: 88.9 fL (ref 80.0–100.0)
Platelets: 217 10*3/uL (ref 150–400)
RBC: 3.69 MIL/uL — ABNORMAL LOW (ref 4.22–5.81)
RDW: 16.2 % — ABNORMAL HIGH (ref 11.5–15.5)
WBC: 7 10*3/uL (ref 4.0–10.5)
nRBC: 0 % (ref 0.0–0.2)

## 2023-09-04 LAB — BLOOD GAS, VENOUS
Acid-base deficit: 1.8 mmol/L (ref 0.0–2.0)
Bicarbonate: 23.1 mmol/L (ref 20.0–28.0)
O2 Saturation: 69.8 %
Patient temperature: 37
pCO2, Ven: 39 mm[Hg] — ABNORMAL LOW (ref 44–60)
pH, Ven: 7.38 (ref 7.25–7.43)
pO2, Ven: 38 mm[Hg] (ref 32–45)

## 2023-09-04 LAB — PHOSPHORUS: Phosphorus: 3.1 mg/dL (ref 2.5–4.6)

## 2023-09-04 LAB — LIPID PANEL
Cholesterol: 121 mg/dL (ref 0–200)
HDL: 34 mg/dL — ABNORMAL LOW (ref >40)
LDL Cholesterol: 77 mg/dL (ref 0–99)
Total CHOL/HDL Ratio: 3.6 {ratio}
Triglycerides: 50 mg/dL (ref <150)
VLDL: 10 mg/dL (ref 0–40)

## 2023-09-04 LAB — MAGNESIUM: Magnesium: 2 mg/dL (ref 1.7–2.4)

## 2023-09-04 MED ORDER — ASPIRIN 81 MG PO TBEC
81.0000 mg | DELAYED_RELEASE_TABLET | Freq: Every day | ORAL | Status: DC
  Administered 2023-09-04 – 2023-09-19 (×16): 81 mg via ORAL
  Filled 2023-09-04 (×16): qty 1

## 2023-09-04 MED ORDER — PNEUMOCOCCAL 20-VAL CONJ VACC 0.5 ML IM SUSY
0.5000 mL | PREFILLED_SYRINGE | INTRAMUSCULAR | Status: AC
  Administered 2023-09-10: 0.5 mL via INTRAMUSCULAR
  Filled 2023-09-04 (×2): qty 0.5

## 2023-09-04 MED ORDER — ACETAMINOPHEN 650 MG RE SUPP: 650.0000 mg | RECTAL | Status: DC | PRN

## 2023-09-04 MED ORDER — IOHEXOL 350 MG/ML SOLN
75.0000 mL | Freq: Once | INTRAVENOUS | Status: AC | PRN
  Administered 2023-09-04: 75 mL via INTRAVENOUS

## 2023-09-04 MED ORDER — HYDRALAZINE HCL 20 MG/ML IJ SOLN
5.0000 mg | INTRAMUSCULAR | Status: DC | PRN
  Administered 2023-09-06: 5 mg via INTRAVENOUS
  Filled 2023-09-04: qty 1

## 2023-09-04 MED ORDER — THIAMINE HCL 100 MG/ML IJ SOLN: 100.0000 mg | Freq: Every day | INTRAMUSCULAR | Status: DC

## 2023-09-04 MED ORDER — ACETAMINOPHEN 325 MG PO TABS
650.0000 mg | ORAL_TABLET | ORAL | Status: DC | PRN
  Administered 2023-09-04 – 2023-09-15 (×7): 650 mg via ORAL
  Filled 2023-09-04 (×7): qty 2

## 2023-09-04 MED ORDER — FOLIC ACID 1 MG PO TABS
1.0000 mg | ORAL_TABLET | Freq: Every day | ORAL | Status: DC
  Administered 2023-09-04 – 2023-09-19 (×16): 1 mg via ORAL
  Filled 2023-09-04 (×17): qty 1

## 2023-09-04 MED ORDER — APIXABAN 5 MG PO TABS
5.0000 mg | ORAL_TABLET | Freq: Two times a day (BID) | ORAL | Status: DC
  Administered 2023-09-04 – 2023-09-19 (×31): 5 mg via ORAL
  Filled 2023-09-04 (×31): qty 1

## 2023-09-04 MED ORDER — ADULT MULTIVITAMIN W/MINERALS CH
1.0000 | ORAL_TABLET | Freq: Every day | ORAL | Status: DC
  Administered 2023-09-04 – 2023-09-19 (×16): 1 via ORAL
  Filled 2023-09-04 (×17): qty 1

## 2023-09-04 MED ORDER — THIAMINE MONONITRATE 100 MG PO TABS
100.0000 mg | ORAL_TABLET | Freq: Every day | ORAL | Status: DC
  Administered 2023-09-04 – 2023-09-19 (×16): 100 mg via ORAL
  Filled 2023-09-04 (×16): qty 1

## 2023-09-04 MED ORDER — ATORVASTATIN CALCIUM 40 MG PO TABS
40.0000 mg | ORAL_TABLET | Freq: Every day | ORAL | Status: DC
  Administered 2023-09-04 – 2023-09-19 (×16): 40 mg via ORAL
  Filled 2023-09-04 (×16): qty 1

## 2023-09-04 MED ORDER — SENNOSIDES-DOCUSATE SODIUM 8.6-50 MG PO TABS
1.0000 | ORAL_TABLET | Freq: Every evening | ORAL | Status: DC | PRN
  Administered 2023-09-11 – 2023-09-19 (×4): 1 via ORAL
  Filled 2023-09-04 (×5): qty 1

## 2023-09-04 MED ORDER — ACETAMINOPHEN 160 MG/5ML PO SOLN: 650.0000 mg | ORAL | Status: DC | PRN

## 2023-09-04 MED ORDER — INFLUENZA VIRUS VACC SPLIT PF (FLUZONE) 0.5 ML IM SUSY
0.5000 mL | PREFILLED_SYRINGE | INTRAMUSCULAR | Status: AC
  Administered 2023-09-09: 0.5 mL via INTRAMUSCULAR

## 2023-09-04 MED ORDER — ENOXAPARIN SODIUM 40 MG/0.4ML IJ SOSY
40.0000 mg | PREFILLED_SYRINGE | INTRAMUSCULAR | Status: DC
  Administered 2023-09-04: 40 mg via SUBCUTANEOUS
  Filled 2023-09-04: qty 0.4

## 2023-09-04 MED ORDER — STROKE: EARLY STAGES OF RECOVERY BOOK
Freq: Once | Status: AC
  Filled 2023-09-04: qty 1

## 2023-09-04 MED ORDER — SODIUM CHLORIDE 0.9 % IV SOLN: INTRAVENOUS | Status: AC

## 2023-09-04 MED ORDER — NICOTINE 14 MG/24HR TD PT24
14.0000 mg | MEDICATED_PATCH | Freq: Every day | TRANSDERMAL | Status: DC
  Administered 2023-09-04 – 2023-09-19 (×15): 14 mg via TRANSDERMAL
  Filled 2023-09-04 (×17): qty 1

## 2023-09-04 NOTE — Plan of Care (Signed)
  Problem: Education: Goal: Knowledge of disease or condition will improve Outcome: Progressing Goal: Knowledge of secondary prevention will improve (MUST DOCUMENT ALL) Outcome: Progressing Goal: Knowledge of patient specific risk factors will improve (DELETE if not current risk factor) Outcome: Progressing   Problem: Ischemic Stroke/TIA Tissue Perfusion: Goal: Complications of ischemic stroke/TIA will be minimized Outcome: Progressing   Problem: Coping: Goal: Will verbalize positive feelings about self Outcome: Progressing Goal: Will identify appropriate support needs Outcome: Progressing   Problem: Nutrition: Goal: Risk of aspiration will decrease Outcome: Progressing Goal: Dietary intake will improve Outcome: Progressing   Problem: Activity: Goal: Risk for activity intolerance will decrease Outcome: Progressing   Problem: Elimination: Goal: Will not experience complications related to bowel motility Outcome: Progressing Goal: Will not experience complications related to urinary retention Outcome: Progressing   Problem: Safety: Goal: Ability to remain free from injury will improve Outcome: Progressing

## 2023-09-04 NOTE — Consult Note (Signed)
NEUROLOGY CONSULT NOTE   Date of service: September 04, 2023 Patient Name: Craig Schmidt MRN:  [id number] DOB:  07/28/1970 Chief Complaint: "Left-sided weakness" Requesting Provider: Fran Lowes, DO  History of Present Illness  AARO MEYERS is a 53 y.o. male with hx of previous stroke causing right arm weakness who presents with left-sided weakness and confusion that started yesterday around 1 PM.  He has a history of atrial fibrillation and takes Eliquis, but his missed multiple doses recently.  He also has a history of cocaine use, with most recent use sometime in the past week.  Due to the left-sided weakness, he was brought into the emergency department where an MRI was performed demonstrating a right parietal infarct as well as several other small punctate infarctions.  LKW: 1 PM IV Thrombolysis: Outside of window EVT: No LVO   NIHSS components Score: Comment  1a Level of Conscious 0[x]  1[]  2[]  3[]      1b LOC Questions 0[x]  1[]  2[]       1c LOC Commands 0[x]  1[]  2[]       2 Best Gaze 0[x]  1[]  2[]       3 Visual 0[x]  1[]  2[]  3[]      4 Facial Palsy 0[]  1[x]  2[]  3[]      5a Motor Arm - left 0[]  1[]  2[x]  3[]  4[]  UN[]    5b Motor Arm - Right 0[]  1[]  2[x]  3[]  4[]  UN[]    6a Motor Leg - Left 0[]  1[x]  2[]  3[]  4[]  UN[]    6b Motor Leg - Right 0[]  1[x]  2[]  3[]  4[]  UN[]    7 Limb Ataxia 0[x]  1[]  2[]  3[]  UN[]     8 Sensory 0[x]  1[]  2[]  UN[]      9 Best Language 0[x]  1[]  2[]  3[]      10 Dysarthria 0[]  1[x]  2[]  UN[]      11 Extinct. and Inattention 0[x]  1[]  2[]       TOTAL: 8      Past History   Past Medical History:  Diagnosis Date   Alcohol abuse    Asthma    Brain tumor (HCC)    CAD (coronary artery disease)    Chronic HFrEF (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)    Chronic kidney disease, stage 3 (HCC)    Cocaine abuse (HCC)    History of medication noncompliance    Homelessness    MI (myocardial infarction) (HCC)    STEMI (ST elevation myocardial infarction)  (HCC)    Stroke [facility])     Past Surgical History:  Procedure Laterality Date   BRONCHIAL NEEDLE ASPIRATION BIOPSY  07/03/2023   Procedure: BRONCHIAL NEEDLE ASPIRATION BIOPSIES;  Surgeon: Lorin Glass, MD;  Location: [facility] ENDOSCOPY;  Service: Pulmonary;;   BRONCHIAL WASHINGS  07/03/2023   Procedure: BRONCHIAL WASHINGS;  Surgeon: Lorin Glass, MD;  Location: [facility] ENDOSCOPY;  Service: Pulmonary;;   BUBBLE STUDY  09/11/2021   Procedure: BUBBLE STUDY;  Surgeon: Pricilla Riffle, MD;  Location: [facility] ENDOSCOPY;  Service: Cardiovascular;;   LEFT HEART CATH AND CORONARY ANGIOGRAPHY N/A 12/19/2021   Procedure: LEFT HEART CATH AND CORONARY ANGIOGRAPHY;  Surgeon: Lyn Records, MD;  Location: [facility] INVASIVE CV LAB;  Service: Cardiovascular;  Laterality: N/A;   RIGHT/LEFT HEART CATH AND CORONARY ANGIOGRAPHY N/A 04/13/2022   Procedure: RIGHT/LEFT HEART CATH AND CORONARY ANGIOGRAPHY;  Surgeon: Laurey Morale, MD;  Location: [facility] INVASIVE CV LAB;  Service: Cardiovascular;  Laterality: N/A;   TEE WITHOUT CARDIOVERSION N/A 09/11/2021   Procedure: TRANSESOPHAGEAL ECHOCARDIOGRAM (TEE);  Surgeon: Dietrich Pates  V, MD;  Location: [facility] ENDOSCOPY;  Service: Cardiovascular;  Laterality: N/A;   VIDEO BRONCHOSCOPY WITH ENDOBRONCHIAL ULTRASOUND N/A 07/03/2023   Procedure: VIDEO BRONCHOSCOPY WITH ENDOBRONCHIAL ULTRASOUND;  Surgeon: Lorin Glass, MD;  Location: [facility] ENDOSCOPY;  Service: Pulmonary;  Laterality: N/A;    Family History: Family History  Problem Relation Age of Onset   Diabetes Mother     Social History  reports that he has been smoking cigarettes. He has a 41 pack-year smoking history. He has been exposed to tobacco smoke. He has never used smokeless tobacco. He reports current alcohol use of about 63.0 standard drinks of alcohol per week. He reports current drug use. Drugs: "Crack" cocaine and Cocaine.  Allergies  Allergen Reactions   Shellfish Allergy Anaphylaxis   Peanut (Diagnostic) Itching    Medications    Current Facility-Administered Medications:    [START ON 09/05/2023]  stroke: early stages of recovery book, , Does not apply, Once, Crosley, Debby, MD   0.9 %  sodium chloride infusion, , Intravenous, Continuous, Crosley, Debby, MD, Last Rate: 40 mL/hr at 09/04/23 1610, Restarted at 09/04/23 9604   acetaminophen (TYLENOL) tablet 650 mg, 650 mg, Oral, Q4H PRN **OR** acetaminophen (TYLENOL) 160 MG/5ML solution 650 mg, 650 mg, Per Tube, Q4H PRN **OR** acetaminophen (TYLENOL) suppository 650 mg, 650 mg, Rectal, Q4H PRN, Crosley, Debby, MD   aspirin EC tablet 81 mg, 81 mg, Oral, Daily, Crosley, Debby, MD   atorvastatin (LIPITOR) tablet 40 mg, 40 mg, Oral, Daily, Crosley, Debby, MD   enoxaparin (LOVENOX) injection 40 mg, 40 mg, Subcutaneous, Q24H, Crosley, Debby, MD   folic acid (FOLVITE) tablet 1 mg, 1 mg, Oral, Daily, Crosley, Debby, MD, 1 mg at 09/04/23 0207   hydrALAZINE (APRESOLINE) injection 5 mg, 5 mg, Intravenous, Q4H PRN, Joneen Roach, Debby, MD   multivitamin with minerals tablet 1 tablet, 1 tablet, Oral, Daily, Crosley, Debby, MD, 1 tablet at 09/04/23 0207   nicotine (NICODERM CQ - dosed in mg/24 hours) patch 14 mg, 14 mg, Transdermal, Daily, Crosley, Debby, MD, 14 mg at 09/04/23 0207   senna-docusate (Senokot-S) tablet 1 tablet, 1 tablet, Oral, QHS PRN, Crosley, Debby, MD   thiamine (VITAMIN B1) tablet 100 mg, 100 mg, Oral, Daily, 100 mg at 09/04/23 0207 **OR** thiamine (VITAMIN B1) injection 100 mg, 100 mg, Intravenous, Daily, Crosley, Debby, MD  Vitals   Vitals:   09/03/23 2140 09/04/23 0417  BP: (!) 157/118 (!) 135/101  Pulse: 100 98  Resp: 16 (!) 21  Temp: 97.6 F (36.4 C) 97.8 F (36.6 C)  TempSrc: Oral Oral  SpO2: 96% 100%    There is no height or weight on file to calculate BMI.  Physical Exam   Constitutional: Appears well-developed and well-nourished.  Neurologic Examination    Neuro: Mental Status: Patient is awake, alert, oriented to person, place, month, year, and  situation. He is mildly dysarthric.  He does not extinguish to double simultaneous stimulation Cranial Nerves: II: Visual Fields are full. Pupils are equal, round, and reactive to light.   III,IV, VI: He has a slightly disconjugate gaze (baseline) but is able to cross midline in both directions V: Facial sensation is symmetric to temperature VII: Facial movement with mild left facial weakness Motor: He has weakness of all four extremities, his left arm seems to be the most affected at 3/5, but he has significant weakness of the right arm as well.  He is able to hold both legs against gravity, but does drift bilaterally. Sensory: Reports symmetric sensation  Cerebellar: Consistent with weakness       Labs/Imaging/Neurodiagnostic studies   CBC:  Recent Labs  Lab 29-Sep-2023 1634  WBC 8.1  NEUTROABS 5.7  HGB 11.3*  HCT 37.2*  MCV 92.3  PLT 235   Basic Metabolic Panel:  Lab Results  Component Value Date   NA 137 09-29-23   K 4.2 09-29-2023   CO2 20 (L) 09-29-2023   GLUCOSE 97 09/29/2023   BUN 18 09-29-23   CREATININE 1.26 (H) 29-Sep-2023   CALCIUM 9.2 09-29-23   GFRNONAA >60 September 29, 2023   GFRAA 52 (L) 10/03/2018   Lipid Panel:  Lab Results  Component Value Date   LDLCALC 38 05/27/2023   HgbA1c:  Lab Results  Component Value Date   HGBA1C 5.6 05/27/2023   Urine Drug Screen:     Component Value Date/Time   LABOPIA NONE DETECTED 29-Sep-2023 2000   COCAINSCRNUR POSITIVE (A) 09/29/23 2000   COCAINSCRNUR See Final Results 12/20/2021 0028   LABBENZ NONE DETECTED 09/29/2023 2000   AMPHETMU NONE DETECTED September 29, 2023 2000   THCU NONE DETECTED 09-29-2023 2000   LABBARB NONE DETECTED 09/29/23 2000    Alcohol Level     Component Value Date/Time   ETH <10 29-Sep-2023 1634   INR  Lab Results  Component Value Date   INR 1.3 (H) 05/26/2023   APTT  Lab Results  Component Value Date   APTT 33 05/26/2023      MRI Brain(Personally reviewed): Sizable right  parietal infarct,   ASSESSMENT   ZEEK ROSTRON is a 53 y.o. male with a history of atrial fibrillation and cocaine abuse who has not been completely compliant with his Eliquis who presents with multiple small ischemic strokes.  Is possible that this is due to his atrial fibrillation, but a contribution from cocaine is difficult to rule out.  RECOMMENDATIONS  - HgbA1c, fasting lipid panel - MRI of the brain without contrast - Frequent neuro checks - Echocardiogram - CTA head and neck - Prophylactic therapy-Antiplatelet med: Aspirin - dose 81mg   - Risk factor modification - Telemetry monitoring - PT consult, OT consult, Speech consult - Stroke team to follow  ______________________________________________________________________    Signed, Ritta Slot, MD [facility]

## 2023-09-04 NOTE — Progress Notes (Signed)
New Admission Notes Arrival Time: 04:08AM  Method of Arrival: Stretcher accompanied by [employer] Team and his girlfriend Cognition: Alert and oriented x4 On Room Interior and spatial designer protocol initiated: Side rails up, bed wheels locked, Call bell within reach Orientation: Pt and girlfriend oriented to room set-up

## 2023-09-04 NOTE — Progress Notes (Signed)
05:55H Patient off to Radiology for CT Scan via bed, accompanied by transport service.

## 2023-09-04 NOTE — Progress Notes (Signed)
Heart Failure Navigator Progress Note  Assessed for Heart & Vascular TOC clinic readiness.  Patient does not meet criteria due to Advanced Heart Failure Team patient of Dr. Shirlee Latch.   Navigator will sign off at this time.    Rhae Hammock, BSN, Scientist, clinical (histocompatibility and immunogenetics) Only

## 2023-09-04 NOTE — Progress Notes (Signed)
The patient is a 53 yr old man who presented to [facility] ED on 09/03/2023 with complaints of loss of vision, weakness, and inability to stand. He had been admitted to [facility] 07/09/2024 for acute systolic heart failure, and 06/26/11/12/ for multifocal pneumonia with necrotizing component s/p BAL with negative bacterial culture and AFB/fungal cultures.   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   He was brought to the ED by EMS. CT head was negative, and the patient left AMA. He was unable to move his left arm at this time, and this was pointed out to the patient. Ultimately he did come back to have the MRI which confirmed a stroke. Neurology was contacted. The patient was given an aspirin and admitted to a telemetry bed. The patient's eliquis was held.   In the ED the patient had a blood pressure of 160/120, and troponins were 639 (reduced to 444). BNP was 2078, UDS was positive for cocaine. CXR demonstrated left retrocardiac opacity. QTc was 519.   The patient was admitted to a telemetry bed by my colleague, Dr. Joneen Roach, early this morning.  MRI demonstrated a large 3 cm right parietal and insular embolic infarct along with 3 small punctate infarcts in the right medial occipital lobes and left frontal vertex and left inferior cerebellum. CTA of the head/neck demonstrated a right MCA branch occlusion corresponding to the infarct.   On exam the patient was awake, alert, and oriented x 3. He states that his visual problems have not resolved. He continues to have lower extremity weakness. Heart and lung exam was within normal limits. Abdomen was soft, non-tender, non-distended. Extremities were negative for cyanosis, clubbing and edema.

## 2023-09-04 NOTE — Progress Notes (Addendum)
STROKE TEAM PROGRESS NOTE   INTERIM HISTORY/SUBJECTIVE Endorses missing doses of eliquis. Most recent echo with EF 20%.  Left field cut, left lower extremity weakness, pain in toes  Follows with Dr. Shirlee Latch outpatient however has not had an appt since his hospital discharge. Did see family medicine on 07/29/2023.  Per most recent cardiology notes they recommend Eliquis and aspirin, we will resume this regimen today. MRI brain shows large 3 cm right parietal and insular embolic infarct along with 3 small punctate infarcts in the right medial occipital lobes and left frontal vertex left inferior cerebellum. CT angiogram shows peripheral right MCA branch occlusion corresponding to the infarct. OBJECTIVE  CBC    Component Value Date/Time   WBC 7.0 09/04/2023 0551   RBC 3.69 (L) 09/04/2023 0551   HGB 10.5 (L) 09/04/2023 0551   HGB 12.6 (L) 07/29/2023 1429   HCT 32.8 (L) 09/04/2023 0551   HCT 41.2 07/29/2023 1429   PLT 217 09/04/2023 0551   PLT 477 (H) 07/29/2023 1429   MCV 88.9 09/04/2023 0551   MCV 91 07/29/2023 1429   MCH 28.5 09/04/2023 0551   MCHC 32.0 09/04/2023 0551   RDW 16.2 (H) 09/04/2023 0551   RDW 15.7 (H) 07/29/2023 1429   LYMPHSABS 1.7 09/03/2023 1634   MONOABS 0.4 09/03/2023 1634   EOSABS 0.3 09/03/2023 1634   BASOSABS 0.0 09/03/2023 1634    BMET    Component Value Date/Time   NA 137 09/03/2023 1634   NA 140 07/29/2023 1429   K 4.2 09/03/2023 1634   CL 106 09/03/2023 1634   CO2 20 (L) 09/03/2023 1634   GLUCOSE 97 09/03/2023 1634   BUN 18 09/03/2023 1634   BUN 29 (H) 07/29/2023 1429   CREATININE 1.26 (H) 09/03/2023 1634   CALCIUM 9.2 09/03/2023 1634   EGFR 69 07/29/2023 1429   GFRNONAA >60 09/03/2023 1634    IMAGING past 24 hours CT ANGIO HEAD NECK W WO CM Result Date: 09/04/2023 CLINICAL DATA:  Neuro deficit with stroke suspected EXAM: CT ANGIOGRAPHY HEAD AND NECK WITH AND WITHOUT CONTRAST TECHNIQUE: Multidetector CT imaging of the head and neck was  performed using the standard protocol during bolus administration of intravenous contrast. Multiplanar CT image reconstructions and MIPs were obtained to evaluate the vascular anatomy. Carotid stenosis measurements (when applicable) are obtained utilizing NASCET criteria, using the distal internal carotid diameter as the denominator. RADIATION DOSE REDUCTION: This exam was performed according to the departmental dose-optimization program which includes automated exposure control, adjustment of the mA and/or kV according to patient size and/or use of iterative reconstruction technique. CONTRAST:  75mL OMNIPAQUE IOHEXOL 350 MG/ML SOLN COMPARISON:  Brain MRI from yesterday.  09/08/2021 CTA FINDINGS: CT HEAD FINDINGS Brain: Mix of acute and chronic infarcts affecting the bilateral frontal and parietal cortex without evidence of progression since brain MRI yesterday. No hemorrhage, hydrocephalus, or shift. Vascular: See below Skull: Normal. Negative for fracture or focal lesion. Sinuses/Orbits: No acute finding. Review of the MIP images confirms the above findings CTA NECK FINDINGS Aortic arch: 3 vessel branching. Right carotid system: No evidence of dissection, stenosis (50% or greater), or occlusion. Left carotid system: No evidence of dissection, stenosis (50% or greater), or occlusion. Vertebral arteries: Codominant. No evidence of dissection, stenosis (50% or greater), or occlusion. Skeleton: Negative. Other neck: Negative. Upper chest: Hazy appearance of the bilateral lungs with small pleural effusion and septal thickening, CHF findings. Review of the MIP images confirms the above findings CTA HEAD FINDINGS Anterior circulation: Diffusely  attenuated intracranial branches with peripheral luminal irregularity that is presumably atherosclerosis. There is a right M3 branch which is not seen to enhance proximally but does reconstitute, see markings on coronal reformats. This affected branch correlates with the acute  right MCA distribution infarct. Comparatively less intense enhancement of left MCA branches which was also noted on prior and especially in a distribution related to the chronic frontal parietal infarcts. No emergent large vessel occlusion or focal and reversible proximal stenosis. Posterior circulation: Diffusely attenuated vertebral and basilar arteries without branch occlusion, beading, or aneurysm. Venous sinuses: Unremarkable Anatomic variants: Unremarkable Review of the MIP images confirms the above findings IMPRESSION: 1. Peripheral right MCA branch occlusion correlating with the acute infarct. 2. Generalized attenuated appearance of intracranial branches with peripheral narrowings, pattern similar to 2023 comparison. This may reflect atherosclerosis or other chronic vasculopathy. 3. No significant atherosclerosis or focal stenosis of major arteries in the neck. 4. CHF. Electronically Signed   By: Tiburcio Pea M.D.   On: 09/04/2023 06:41   MR BRAIN WO CONTRAST Result Date: 09/03/2023 CLINICAL DATA:  Neuro deficit, acute, stroke suspected. Weakness. Unable to walk. EXAM: MRI HEAD WITHOUT CONTRAST TECHNIQUE: Multiplanar, multiecho pulse sequences of the brain and surrounding structures were obtained without intravenous contrast. COMPARISON:  CT same day.  MRI 05/26/2023 FINDINGS: Brain: Diffusion imaging shows a probable punctate acute infarction within the inferior cerebellum on the left. There is a 3 cm region of acute cortical and subcortical infarction on the right affecting the deep insula and parietal lobe. No evidence of hemorrhagic transformation. There are 2 or 3 punctate acute infarctions in the right medial occipital cortex. There are a few clustered punctate acute infarctions in the left frontal vertex region. These findings suggest embolic disease from the heart or ascending aorta. Otherwise, there are few old small vessel cerebellar infarctions. Cerebral hemispheres otherwise show old  bilateral frontal and parietal cortical and subcortical infarctions. No evidence of intra-axial mass lesion. 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 Extra canalicular component measures 11 mm, without apparent mass-effect upon the brain. No hydrocephalus. No subdural collection. Vascular: Major vessels at the base of the brain show flow. Skull and upper cervical spine: Negative Sinuses/Orbits: Clear/normal Other: None IMPRESSION: 1. 3 cm region of acute cortical and subcortical infarction on the right affecting the deep insula and parietal lobe consistent with embolic infarction in the right MCA territory. 2 or 3 punctate acute infarctions in the right medial occipital cortex. Few clustered punctate acute infarctions in the left frontal vertex region. Probable punctate acute infarction in the inferior left cerebellum. These findings suggest embolic disease from the heart or ascending aorta. 2. Old bilateral frontal and parietal cortical and subcortical infarctions. 3. 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 Extra canalicular component measures 11 mm, without apparent mass-effect upon the brain. Electronically Signed   By: Paulina Fusi M.D.   On: 09/03/2023 22:56   CT Head Wo Contrast Result Date: 09/03/2023 CLINICAL DATA:  Syncope/presyncope. Cerebrovascular cause suspected. EXAM: CT HEAD WITHOUT CONTRAST TECHNIQUE: Contiguous axial images were obtained from the base of the skull through the vertex without intravenous contrast. RADIATION DOSE REDUCTION: This exam was performed according to the departmental dose-optimization program which includes automated exposure control, adjustment of the mA and/or kV according to patient size and/or use of iterative reconstruction technique. COMPARISON:  MRI 05/26/2023 FINDINGS: Brain: Generalized atrophy. Numerous old cortical infarctions  including in both parietal lobes and in the left frontal lobe. No evidence of acute infarction, mass lesion, hemorrhage, hydrocephalus or extra-axial collection. Incidental mega cisterna magna. Known left vestibular schwannoma. Vascular: No abnormal vascular finding. Skull: Negative Sinuses/Orbits: Clear/normal Other: None IMPRESSION: No acute CT finding. Generalized atrophy. Numerous old cortical infarctions including in both parietal lobes and in the left frontal lobe. Known left vestibular schwannoma without apparent change. Electronically Signed   By: Paulina Fusi M.D.   On: 09/03/2023 17:33   DG Chest Portable 1 View Result Date: 09/03/2023 CLINICAL DATA:  Concern for pneumonia. EXAM: PORTABLE CHEST 1 VIEW COMPARISON:  07/11/2023. FINDINGS: There is left retrocardiac airspace opacity obscuring the left hemidiaphragm, descending thoracic aorta and blunting the left lateral costophrenic angle, suggesting combination of left lung atelectasis and/or consolidation with pleural effusion. Bilateral lung fields are otherwise clear. Right lateral costophrenic angle is clear. No pneumothorax on either side. Stable mildly enlarged cardio-mediastinal silhouette. No acute osseous abnormalities. The soft tissues are within normal limits. IMPRESSION: *Left retrocardiac opacity, as described above. Electronically Signed   By: Jules Schick M.D.   On: 09/03/2023 16:09    Vitals:   09/03/23 2140 09/04/23 0417  BP: (!) 157/118 (!) 135/101  Pulse: 100 98  Resp: 16 (!) 21  Temp: 97.6 F (36.4 C) 97.8 F (36.6 C)  TempSrc: Oral Oral  SpO2: 96% 100%     PHYSICAL EXAM General:  Alert, well-nourished, well-developed patient in no acute distress Psych:  Mood and affect appropriate for situation CV: Regular rate and rhythm on monitor Respiratory:  Regular, unlabored respirations on room air GI: Abdomen soft and nontender   NEURO:  Mental Status: AA&Ox3, patient is able to give clear and coherent  history Speech/Language: speech is without dysarthria or aphasia.  Naming, repetition, fluency, and comprehension intact.  Cranial Nerves:  II: PERRL.  Left field cut III, IV, VI: EOMI. Eyelids elevate symmetrically.  V: Sensation is intact to light touch and symmetrical to face.  VII: Face is symmetrical resting and smiling VIII: hearing intact to voice. IX, X: Palate elevates symmetrically. Phonation is normal.  WU:[id number] shrug 5/5. XII: tongue is midline without fasciculations. Motor: Left lower extremity weakness worse than right Right upper extremity weakness baseline from prior infarct Tone: is normal and bulk is normal Sensation- Intact to light touch bilaterally. Extinction absent to light touch to DSS.   Coordination: FTN intact bilaterally, HKS: no ataxia in BLE.No drift.  Gait- deferred  Most Recent NIH 8     ASSESSMENT/PLAN  Mr. EASHAN SCHIPANI is a 53 y.o. male with history of previous stroke causing right arm weakness, CAD, CHFrEF and biventricular failure, mitral stenosis, current cocaine use, current cigarette smoker, CKD stage II, homelessness who presents with left-sided weakness and confusion.  NIH on Admission 8  Patiently recently admitted 12/18-12/23 with acute systolic heart failure.  Previous admission 12/4-12/12 with multifocal pneumonia with necrotizing component, s/p BAL (12/11) with negative bacterial culture and AFB/fungal cultures.   Acute Ischemic Infarct:  Right MCA territory, left frontal, left cerebellum infarcts Etiology:  embolic in the setting of missed eliquis , atrial fibrillation versus cardiomyopathy and vasculopathy from cocaine use and uncontrolled risk factors CTA head & neck right MCA branch occlusion.  It generalized attenuated appearance of intracranial branches with peripheral narrowing similar to 2023. MRI  3 cm region of acute cortical and subcortical infarction on the right affecting the deep insula  and parietal lobe consistent  with embolic infarction in the  right MCA territory. 2 or 3 punctate acute infarctions in the right medial occipital cortex. Few clustered punctate acute infarctions in the left  frontal vertex region. Probable punctate acute infarction in the inferior left cerebellum. These findings suggest embolic disease from the heart or ascending aorta. 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  Was discharged on warfarin daily  Chronic HFrEF Biventricular failure Mitral valve stenosis Not a surgical candidate in the setting of continued drug [place] GDMT- coreg, spironolactone, jardiance, entresto   Hx STEMI CAD Lipitor 40mg  At one point he was on Plavix plus Eliquis with the plan to convert to Eliquis and aspirin after 1 year, however Eliquis was discontinued for unclear reasons.  He was then resumed on Eliquis and aspirin during his hospitalization in December 2024. Troponin elevated 639 -> 444  Hyperlipidemia Home meds: Atorvastatin 40 mg, resumed in hospital LDL 77, goal < 70 Continue statin at discharge  CKD stage II Creatinine 1.26, GFR > 60  Prediabetic Home meds:  None HgbA1c 5.6, goal < 7.0 CBGs  Tobacco Abuse       Ready to quit? Yes Nicotine replacement therapy provided  Substance Abuse UDS positive for Cocaine       Ready to quit? Yes TOC consult for cessation placed  Hospital day # 0  Patient seen and examined by NP/APP with MD. MD to update note as needed.   Elmer Picker, DNP, [name]-BC [facility] Pager: [phone]  STROKE MD NOTE :  I have personally obtained history,examined this patient, reviewed  notes, independently viewed imaging studies, participated in medical decision making and plan of care.ROS completed by me personally and pertinent positives fully documented  I have made any additions or clarifications directly to the above note. Agree with note above.  He presented with sudden onset of confusion and left-sided weakness due to embolic right MCA branch infarct secondary to A-fib and cardiomyopathy and being noncompliant with his Eliquis as well as cocaine usage.  Patient counseled to be compliant with taking his Eliquis and to quit using cocaine and smoking cigarettes and make healthy lifestyle changes.  Continue Eliquis for stroke prevention and aspirin for his cardiac prevention.  Aggressive risk factor modification.  Physical Occupational Therapy consults.  Stroke team will sign off.  Kindly call for questions.  Greater than 50% time during this 50-minute visit was spent in counseling and coordination of care and discussion with patient and care team and answered questions.  Delia Heady, MD Medical Director [facility] Pager: [phone] 09/04/2023 4:30 PM   To contact Stroke Continuity provider, please refer to [email or web address]. After hours, contact General Neurology

## 2023-09-04 NOTE — TOC Initial Note (Signed)
Transition of Care [facility]) - Initial/Assessment Note    Patient Details  Name: Craig Schmidt MRN: [id number] Date of Birth: 21-Nov-1970  Transition of [facility]) CM/SW Contact:    [name], RN Phone Number: 09/04/2023, 12:23 PM  Clinical Narrative:                  Pt is known to CM from prior admission. He states he has gotten an apartment through housing now where he and his SO stay.  SO is with him all the time.  Pt has old cane and rollator that is broken at home. He is asking for a new rollator. CM will order if approved by PT.  Pt uses public transportation or he has a community CM that assists with transportation. He will need a ride home at d/c.  Pt manages his own medications and he does admit to some noncompliance.  TOC following.  Expected Discharge Plan: Home/Self Care Barriers to Discharge: Continued Medical Work up   Patient Goals and CMS Choice            Expected Discharge Plan and Services       Living arrangements for the past 2 months: Apartment                                      Prior Living Arrangements/Services Living arrangements for the past 2 months: Apartment Lives with:: Significant Other Patient language and need for interpreter reviewed:: Yes Do you feel safe going back to the place where you live?: Yes        Care giver support system in place?: Yes (comment) Current home services: DME (old cane and rollator) Criminal Activity/Legal Involvement Pertinent to Current Situation/Hospitalization: No - Comment as needed  Activities of Daily Living   ADL Screening (condition at time of admission) Independently performs ADLs?: No Does the patient have a NEW difficulty with bathing/dressing/toileting/self-feeding that is expected to last >3 days?: Yes (Initiates electronic notice to provider for possible OT consult) Does the patient have a NEW difficulty with getting in/out of bed, walking, or climbing stairs that is  expected to last >3 days?: Yes (Initiates electronic notice to provider for possible PT consult) Does the patient have a NEW difficulty with communication that is expected to last >3 days?: Yes (Initiates electronic notice to provider for possible SLP consult) Is the patient deaf or have difficulty hearing?: No Does the patient have difficulty seeing, even when wearing glasses/contacts?: Yes (needs glasses, does not have. but has trouble seeing.) Does the patient have difficulty concentrating, remembering, or making decisions?: Yes  Permission Sought/Granted                  Emotional Assessment Appearance:: Appears stated age Attitude/Demeanor/Rapport: Engaged Affect (typically observed): Accepting Orientation: : Oriented to Self, Oriented to Place, Oriented to  Time, Oriented to Situation   Psych Involvement: No (comment)  Admission diagnosis:  Acute CVA (cerebrovascular accident) [facility]) [I63.9] Cerebrovascular accident (CVA), unspecified mechanism (HCC) [I63.9] Patient Active Problem List   Diagnosis Date Noted   Hypertension, malignant 09/04/2023   HFrEF (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) 09/04/2023   Coronary artery disease 06/27/2023   History of CVA (cerebrovascular accident) 06/27/2023   Substance use disorder 06/27/2023   Atypical chest pain 06/21/2023   Pulmonary infiltrate 06/21/2023   Multifocal pneumonia 06/11/2023   Hyperkalemia 06/11/2023   Hyponatremia 06/11/2023  Normocytic anemia 06/11/2023   Thrombocytosis 06/11/2023   Troponin I above reference range 05/25/2023   CAP (community acquired pneumonia) 05/25/2023   Pneumonia 05/25/2023   Chronic combined systolic and diastolic CHF (congestive heart failure) (HCC) 12/03/2022   Malnutrition of moderate degree 11/30/2022   Acute on chronic combined systolic and diastolic CHF (congestive heart failure) (HCC) 11/29/2022   Acute on chronic combined systolic (congestive) and diastolic (congestive)  heart failure (HCC) 11/28/2022   ACS (acute coronary syndrome) (HCC) 11/28/2022   Back pain 11/28/2022   Essential hypertension 11/28/2022   Hyperlipidemia 11/28/2022   Noncompliance with medication regimen 11/28/2022   ST elevation myocardial infarction (STEMI) of inferolateral wall, subsequent episode of care (HCC) 12/19/2021   ST elevation myocardial infarction (STEMI) (HCC)    Ischemic cardiomyopathy    Cocaine abuse (HCC)    Smoker    Alcohol abuse    Acute CVA (cerebrovascular accident) (HCC) 09/08/2021   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 [place] [address] Phone: [phone] Fax: [phone]  [id number] [place], [place] - [address] [place] [address] Phone: [phone] Fax: [phone]  [facility] [place], [place] - [address] [place] [address] Phone: [phone] Fax: [phone]  [place] [address] [place] [address] Phone: [phone] Fax: [phone]  [facility] [address] [place] [address] Phone: [phone] Fax: [phone]     Social Drivers of Health (SDOH) Social History: SDOH Screenings   Food Insecurity: Food Insecurity Present (09/04/2023)  Housing: High Risk (09/04/2023)  Transportation  Needs: Unmet Transportation Needs (09/04/2023)  Utilities: Not At Risk (09/04/2023)  Recent Concern: Utilities - At Risk (08/12/2023)  Alcohol Screen: High Risk (09/19/2021)  Depression (PHQ2-9): High Risk (06/13/2022)  Financial Resource Strain: High Risk (07/19/2023)  Stress: Stress Concern Present (10/16/2022)  Tobacco Use: High Risk (09/04/2023)   SDOH Interventions:     Readmission Risk Interventions    07/12/2023    1:06 PM 06/28/2023    1:27 PM 06/21/2023   12:50 PM  Readmission Risk Prevention Plan  Transportation Screening Complete Complete Complete  PCP or Specialist Appt within 3-5 Days  Complete   HRI or Home Care Consult  Complete   Social Work Consult for Recovery Care Planning/Counseling  Complete   Palliative Care Screening  Not Applicable   Medication Review Oceanographer) Complete Complete Complete  PCP or Specialist appointment within 3-5 days of discharge Complete  Complete  HRI or Home Care Consult Complete  Complete  SW Recovery Care/Counseling Consult Complete  Complete  Palliative Care Screening Not Applicable  Not Applicable  Skilled Nursing Facility Not Applicable  Not Applicable

## 2023-09-04 NOTE — Progress Notes (Signed)
06:18H Patient back from CT Scan. Side rails up, bed wheels lock, call bell within reach.

## 2023-09-04 NOTE — Evaluation (Signed)
Speech Language Pathology Evaluation Patient Details Name: Craig Schmidt MRN: [id number] DOB: 1970/10/06 Today's Date: 09/04/2023 Time: 4098-1191 SLP Time Calculation (min) (ACUTE ONLY): 27 min  Problem List:  Patient Active Problem List   Diagnosis Date Noted   Hypertension, malignant 09/04/2023   HFrEF (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) 09/04/2023   Coronary artery disease 06/27/2023   History of CVA (cerebrovascular accident) 06/27/2023   Substance use disorder 06/27/2023   Atypical chest pain 06/21/2023   Pulmonary infiltrate 06/21/2023   Multifocal pneumonia 06/11/2023   Hyperkalemia 06/11/2023   Hyponatremia 06/11/2023   Normocytic anemia 06/11/2023   Thrombocytosis 06/11/2023   Troponin I above reference range 05/25/2023   CAP (community acquired pneumonia) 05/25/2023   Pneumonia 05/25/2023   Chronic combined systolic and diastolic CHF (congestive heart failure) (HCC) 12/03/2022   Malnutrition of moderate degree 11/30/2022   Acute on chronic combined systolic and diastolic CHF (congestive heart failure) (HCC) 11/29/2022   Acute on chronic combined systolic (congestive) and diastolic (congestive) heart failure (HCC) 11/28/2022   ACS (acute coronary syndrome) (HCC) 11/28/2022   Back pain 11/28/2022   Essential hypertension 11/28/2022   Hyperlipidemia 11/28/2022   Noncompliance with medication regimen 11/28/2022   ST elevation myocardial infarction (STEMI) of inferolateral wall, subsequent episode of care (HCC) 12/19/2021   ST elevation myocardial infarction (STEMI) (HCC)    Ischemic cardiomyopathy    Cocaine abuse (HCC)    Smoker    Alcohol abuse    Acute CVA (cerebrovascular accident) (HCC) 09/08/2021   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   Tear of MCL (medial collateral ligament) of knee 05/20/2015   Protein-calorie malnutrition, severe 05/17/2015   Lactic acidosis 05/15/2015   Left knee pain 05/15/2015   Lung nodules 05/15/2015    Hypoglycemia 05/14/2015   Hypothermia 05/14/2015   Acute encephalopathy 05/14/2015   Cocaine abuse with intoxication (HCC) 05/14/2015   Alcohol intoxication in active alcoholic (HCC) 05/14/2015   Sepsis (HCC) 05/14/2015   Homeless 05/14/2015   Past Medical History:  Past Medical History:  Diagnosis Date   Alcohol abuse    Asthma    Brain tumor (HCC)    CAD (coronary artery disease)    Chronic HFrEF (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)    Chronic kidney disease, stage 3 (HCC)    Cocaine abuse (HCC)    History of medication noncompliance    Homelessness    MI (myocardial infarction) (HCC)    STEMI (ST elevation myocardial infarction) (HCC)    Stroke [facility])    Past Surgical History:  Past Surgical History:  Procedure Laterality Date   BRONCHIAL NEEDLE ASPIRATION BIOPSY  07/03/2023   Procedure: BRONCHIAL NEEDLE ASPIRATION BIOPSIES;  Surgeon: Lorin Glass, MD;  Location: [facility] ENDOSCOPY;  Service: Pulmonary;;   BRONCHIAL WASHINGS  07/03/2023   Procedure: BRONCHIAL WASHINGS;  Surgeon: Lorin Glass, MD;  Location: [facility] ENDOSCOPY;  Service: Pulmonary;;   BUBBLE STUDY  09/11/2021   Procedure: BUBBLE STUDY;  Surgeon: Pricilla Riffle, MD;  Location: [facility] ENDOSCOPY;  Service: Cardiovascular;;   LEFT HEART CATH AND CORONARY ANGIOGRAPHY N/A 12/19/2021   Procedure: LEFT HEART CATH AND CORONARY ANGIOGRAPHY;  Surgeon: Lyn Records, MD;  Location: [facility] INVASIVE CV LAB;  Service: Cardiovascular;  Laterality: N/A;   RIGHT/LEFT HEART CATH AND CORONARY ANGIOGRAPHY N/A 04/13/2022   Procedure: RIGHT/LEFT HEART CATH AND CORONARY ANGIOGRAPHY;  Surgeon: Laurey Morale, MD;  Location: [facility] INVASIVE CV LAB;  Service: Cardiovascular;  Laterality: N/A;   TEE WITHOUT  CARDIOVERSION N/A 09/11/2021   Procedure: TRANSESOPHAGEAL ECHOCARDIOGRAM (TEE);  Surgeon: Pricilla Riffle, MD;  Location: [facility] ENDOSCOPY;  Service: Cardiovascular;  Laterality: N/A;   VIDEO BRONCHOSCOPY WITH ENDOBRONCHIAL ULTRASOUND N/A  07/03/2023   Procedure: VIDEO BRONCHOSCOPY WITH ENDOBRONCHIAL ULTRASOUND;  Surgeon: Lorin Glass, MD;  Location: [facility] ENDOSCOPY;  Service: Pulmonary;  Laterality: N/A;   HPI:  This is a 53 year old male with past medical history of ICM with prior hx CAD, biventricular failure LVEF < 20%, moderate mitral stenosis, ongoing substance use with crack, current cigarette smoker, 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  Patiently recently admitted 12/18-12/23 with acute systolic heart failure; previous admission 12/4-12/12 with multifocal pneumonia with necrotizing component, s/p BAL (12/11) with negative bacterial culture and AFB/fungal cultures. Admitted with visual deficits, weakness, incoherent speech. CT negative, pt left AMA however returned and MRI 3 cm region of acute cortical and subcortical infarction on the right affecting the deep insula and parietal lobe consistent with embolic infarction in the right MCA territory. 2 or 3 punctate acute infarctions in the right medial occipital cortex. Few clustered punctate acute infarctions in the left frontal vertex region. Probable punctate acute infarction in the inferior left cerebellum; old bilateral frontal and parietal cortical and subcortical infarctions. Brief SLE performed 05/2023 due to pain level and pt lability; partial assessment showed dysarthria and cognitive impairments in attention. Recommendation for further diagnostic evaluation in treatment.   Assessment / Plan / Recommendation Clinical Impression  Pt has a history of cognitive impairments with decreased sustained attention and dysarthria from prior stroke. Pt and friend feel this stroke is worse in terms of pt's speech, vision and mobility on left side. He exhibits neurogenic dysfluency marked by part and whole word repetitions and prolongations that is new with this stroke. His speech is intermittently dysarthric with fair-good intelligibility.  He reports diplopia and had difficulty reading words unless the print was large. There was mild left neglect/inattention when describing a picture scene he needed several prompts to identify information on left side. Cognitively, he demonstrates delays in processing however accuracy in the areas of working memory (4/4 word recall independently), verbal problem solving, attention and orientation were within functional limits. ST will follow pt with focus on pt's dysfluency, dysarthria and left neglect/inattention.    SLP Assessment  SLP Recommendation/Assessment: Patient needs continued Speech Lanaguage Pathology Services SLP Visit Diagnosis: Dysarthria and anarthria (R47.1);Other (comment) (neurogenic dysfluency, left neglect)    Recommendations for follow up therapy are one component of a multi-disciplinary discharge planning process, led by the attending physician.  Recommendations may be updated based on patient status, additional functional criteria and insurance authorization.    Follow Up Recommendations  Follow physician's recommendations for discharge plan and follow up therapies    Assistance Recommended at Discharge  Intermittent Supervision/Assistance  Functional Status Assessment Patient has had a recent decline in their functional status and demonstrates the ability to make significant improvements in function in a reasonable and predictable amount of time.  Frequency and Duration min 2x/week  2 weeks      SLP Evaluation Cognition  Overall Cognitive Status: History of cognitive impairments - at baseline (was overall functional for items assessed) Arousal/Alertness: Awake/alert Orientation Level: Oriented to person;Oriented to place;Oriented to situation Attention: Sustained Sustained Attention: Appears intact Memory: Appears intact (recalled 4/4 words) Awareness: Appears intact Problem Solving: Appears intact Safety/Judgment: Appears intact       Comprehension  Auditory  Comprehension Overall Auditory Comprehension: Appears within  functional limits for tasks assessed Visual Recognition/Discrimination Discrimination: Not tested Reading Comprehension Reading Status: Impaired Word level: Impaired (due to "blurry" and diplopia. Can read some words if print is large) Sentence Level: Impaired Paragraph Level: Impaired Interfering Components: Left neglect/inattention;Visual acuity    Expression Expression Primary Mode of Expression: Verbal Verbal Expression Overall Verbal Expression: Appears within functional limits for tasks assessed Initiation:  (impaired due to neurogenic dysfluency not language) Level of Generative/Spontaneous Verbalization: Sentence Repetition:  (NT) Naming: Impairment Divergent: Other (comment) (named 13 items in one minute- goal is 15) Pragmatics: No impairment Written Expression Dominant Hand: Left Written Expression: Not tested   Oral / Motor  Oral Motor/Sensory Function Overall Oral Motor/Sensory Function:  (to be assessed) Motor Speech Overall Motor Speech: Impaired Respiration: Within functional limits Phonation: Normal Resonance: Within functional limits Articulation: Impaired Level of Impairment: Sentence Intelligibility: Intelligibility reduced Motor Planning: Impaired Level of Impairment: Word Motor Speech Errors: Aware;Inconsistent            Royce Macadamia 09/04/2023, 1:12 PM

## 2023-09-04 NOTE — Evaluation (Signed)
Physical Therapy Evaluation Patient Details Name: Craig Schmidt MRN: [id number] DOB: [birth year] Today's Date: 09/04/2023  History of Present Illness  53 y.o. male presenting to ED 2/12 with L sided weakness and confusion. MRI found a right parietal infarct as well as other small punctate infarcts. 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  Clinical Impression  Pt's friend [name] in room helps to clarify PLOF and home set up. PTA pt was living with [name] in housing authority apartment and walking with a cane to assist with R-sided weakness from prior CVA, reports his Rollator is broken and he needs a new one. Pt limited in safe mobility by decreased strength, coordination and endurance as well as visual deficits. Pt is min A for bed mobility, and modA for coming to standing. Pt unable to sequence weightshifting for taking steps today. Patient will benefit from continued inpatient follow up therapy, <3 hours/day. PT will continue to follow acutely.       If plan is discharge home, recommend the following: A lot of help with walking and/or transfers;A lot of help with bathing/dressing/bathroom;Assistance with cooking/housework;Direct supervision/assist for medications management;Direct supervision/assist for financial management;Assist for transportation;Help with stairs or ramp for entrance;Supervision due to cognitive status   Can travel by private vehicle    Yes    Equipment Recommendations Rollator (4 wheels);BSC/3in1     Functional Status Assessment Patient has had a recent decline in their functional status and demonstrates the ability to make significant improvements in function in a reasonable and predictable amount of time.     Precautions / Restrictions Precautions Precautions: Fall Precaution/Restrictions Comments: reports ocassional  falling Restrictions Weight Bearing Restrictions Per Provider Order: No      Mobility  Bed Mobility Overal bed mobility: Needs Assistance Bed Mobility: Supine to Sit, Sit to Supine     Supine to sit: Min assist Sit to supine: Supervision, Min assist   General bed mobility comments: pt is able to come to sitting EoB but cannot sequence putting his feet on the floor, requires min A for scooting hips to EoB, with return to bed pt is able to get LE back into the bed and with bed in Trendelenberg is able to scoot himself up in the bed with min Ax2    Transfers Overall transfer level: Needs assistance Equipment used: 1 person hand held assist Transfers: Sit to/from Stand Sit to Stand: Mod assist           General transfer comment: attempted to place hand on RW to assist in power up to standing but pt unable to maintain grip with L hand or R hand, able to perform face to face sit to stand with use of gait belt. Pt unable to coordinate weightshift to be able to take lateral steps towards EoB, and requested to sit back down.    Ambulation/Gait               General Gait Details: unable today        Balance Overall balance assessment: Needs assistance Sitting-balance support: Feet supported, No upper extremity supported Sitting balance-Leahy Scale: Fair Sitting balance - Comments: sat EoB working with SLP for approximately 8 min before returning to bed, increasing difficulty with keeping upright posture Postural control: Other (comment) Standing balance support: Bilateral upper extremity supported, During functional activity Standing balance-Leahy Scale: Zero Standing balance comment: face to face  assist with gait belt                             Pertinent Vitals/Pain Pain Assessment Pain Assessment: Faces Faces Pain Scale: Hurts a little bit Pain Location: generalized with movement Pain Descriptors / Indicators: Grimacing, Guarding Pain Intervention(s):  Limited activity within patient's tolerance, Monitored during session, Repositioned    Home Living       Type of Home: Apartment (lives in low income housing/apartment)                  Prior Function Prior Level of Function : Needs assist             Mobility Comments: uses cane and Rollator for ambulation, reports his old Occupational hygienist is broken and is requesting new one ADLs Comments: male friend [name] reports she continues to help him with ADLS     Extremity/Trunk Assessment   Upper Extremity Assessment Upper Extremity Assessment: Defer to OT evaluation    Lower Extremity Assessment Lower Extremity Assessment: RLE deficits/detail;LLE deficits/detail RLE Deficits / Details: weakness from historic stroke, ROM WFL, generalized weakness RLE Coordination: decreased fine motor;decreased gross motor LLE Deficits / Details: new generalized weakness from recent CVA ROM WFL, LLE Coordination: decreased fine motor;decreased gross motor    Cervical / Trunk Assessment Cervical / Trunk Assessment: Other exceptions Cervical / Trunk Exceptions: decreased ability to keep his trunk errect, increased C shaped spine in sitting able to correct however has difficulty maintaining  Communication   Communication Communication: Impaired Factors Affecting Communication: Reduced clarity of speech;Difficulty expressing self    Cognition Arousal: Lethargic Behavior During Therapy: Flat affect   PT - Cognitive impairments: Sequencing, Problem solving, Awareness, Initiation, Attention, Safety/Judgement                       PT - Cognition Comments: pt requires increased time for processing and sequencing movement Following commands: Impaired Following commands impaired: Follows one step commands with increased time, Follows multi-step commands with increased time     Cueing Cueing Techniques: Verbal cues, Gestural cues, Tactile cues, Visual cues     General Comments General  comments (skin integrity, edema, etc.): male friend [name] present throughout, endorses L sided weakness is new, pt with discongugate gaze at base line per neurologist note but now experiencing diplopia and decreased peripheral vision on L side which pt reports is new        Assessment/Plan    PT Assessment Patient needs continued PT services  PT Problem List Decreased strength;Decreased activity tolerance;Decreased balance;Decreased mobility;Decreased coordination;Decreased cognition       PT Treatment Interventions DME instruction;Gait training;Functional mobility training;Therapeutic activities;Therapeutic exercise;Balance training;Cognitive remediation;Patient/family education    PT Goals (Current goals can be found in the Care Plan section)  Acute Rehab PT Goals PT Goal Formulation: With patient/family Time For Goal Achievement: 09/18/23 Potential to Achieve Goals: Fair    Frequency Min 1X/week        AM-PAC PT "6 Clicks" Mobility  Outcome Measure Help needed turning from your back to your side while in a flat bed without using bedrails?: None Help needed moving from lying on your back to sitting on the side of a flat bed without using bedrails?: A Little Help needed moving to and from a bed to a chair (including a wheelchair)?: A Lot Help needed standing up from a chair using your arms (e.g., wheelchair or bedside chair)?: A  Lot Help needed to walk in hospital room?: Total Help needed climbing 3-5 steps with a railing? : Total 6 Click Score: 13    End of Session Equipment Utilized During Treatment: Gait belt Activity Tolerance: Patient tolerated treatment well Patient left: in bed;with call bell/phone within reach;with bed alarm set;Other (comment) (SLP in room) Nurse Communication: Mobility status PT Visit Diagnosis: Unsteadiness on feet (R26.81);Other abnormalities of gait and mobility (R26.89);History of falling (Z91.81);Muscle weakness (generalized)  (M62.81);Difficulty in walking, not elsewhere classified (R26.2);Other symptoms and signs involving the nervous system (R29.898);Hemiplegia and hemiparesis Hemiplegia - Right/Left: Left Hemiplegia - dominant/non-dominant: Non-dominant Hemiplegia - caused by: Cerebral infarction    Time: 1130-1152 PT Time Calculation (min) (ACUTE ONLY): 22 min   Charges:   PT Evaluation $PT Eval Moderate Complexity: 1 Mod   PT General Charges $$ ACUTE PT VISIT: 1 Visit         Lieutenant Abarca B. Beverely Risen PT, DPT Acute Rehabilitation Services Please use secure chat or  Call Office [phone]   Elon Alas [facility] 09/04/2023, 1:53 PM

## 2023-09-04 NOTE — H&P (Signed)
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  Patiently recently admitted 12/18-12/23 with acute systolic heart failure.  Previous admission 12/4-12/12 with multifocal pneumonia with necrotizing component, s/p BAL (12/11) with negative bacterial culture and AFB/fungal cultures.  Today the patient was in the lunch line at the food bank, he suddenly complained of not being able to see, became weak and could not stand.  He was talking but incoherent.  He had left-sided weakness.  911 was called he came to the ER.  CT head done was negative.  The patient decided to leave AMA.  His significant other states when she attempted to put on his coat he was not able to move his left arm.  This was discussed with the patient.  Despite encouragement from the staff, patient left AMA.  He later returned MRI done confirming a stroke.  Dr. Amada Jupiter to the hospitalist contacted.  Recommended admission to [facility].  He recommended aspirin only and holding Eliquis which patient is on  Presenting blood pressure 160/120, troponin 639=>444 (no report of chest pains), BNP 2, 078.  Respiratory panel negative.  UDS cocaine positive.  Alcohol level<10.  CXR left retrocardiac opacity.  EKG NSR QTc 519  Review of Systems:  Per HPI  Past Medical History: Past Medical History:  Diagnosis Date   Alcohol abuse    Asthma    Brain tumor (HCC)    CAD (coronary artery disease)    Chronic HFrEF (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)    Chronic kidney disease, stage 3 (HCC)    Cocaine abuse (HCC)    History of medication noncompliance    Homelessness    MI (myocardial infarction) (HCC)    STEMI (ST  elevation myocardial infarction) (HCC)    Stroke [facility])    Past Surgical History:  Procedure Laterality Date   BRONCHIAL NEEDLE ASPIRATION BIOPSY  07/03/2023   Procedure: BRONCHIAL NEEDLE ASPIRATION BIOPSIES;  Surgeon: Lorin Glass, MD;  Location: [facility] ENDOSCOPY;  Service: Pulmonary;;   BRONCHIAL WASHINGS  07/03/2023   Procedure: BRONCHIAL WASHINGS;  Surgeon: Lorin Glass, MD;  Location: [facility] ENDOSCOPY;  Service: Pulmonary;;   BUBBLE STUDY  09/11/2021   Procedure: BUBBLE STUDY;  Surgeon: Pricilla Riffle, MD;  Location: [facility] ENDOSCOPY;  Service: Cardiovascular;;   LEFT HEART CATH AND CORONARY ANGIOGRAPHY N/A 12/19/2021   Procedure: LEFT HEART CATH AND CORONARY ANGIOGRAPHY;  Surgeon: Lyn Records, MD;  Location: [facility] INVASIVE CV LAB;  Service: Cardiovascular;  Laterality: N/A;   RIGHT/LEFT HEART CATH AND CORONARY ANGIOGRAPHY N/A 04/13/2022   Procedure: RIGHT/LEFT HEART CATH AND CORONARY ANGIOGRAPHY;  Surgeon: Laurey Morale, MD;  Location: [facility] INVASIVE CV LAB;  Service: Cardiovascular;  Laterality: N/A;   TEE WITHOUT CARDIOVERSION N/A 09/11/2021   Procedure: TRANSESOPHAGEAL ECHOCARDIOGRAM (TEE);  Surgeon: Pricilla Riffle, MD;  Location: [facility] ENDOSCOPY;  Service: Cardiovascular;  Laterality: N/A;   VIDEO BRONCHOSCOPY WITH ENDOBRONCHIAL ULTRASOUND N/A 07/03/2023   Procedure: VIDEO BRONCHOSCOPY WITH ENDOBRONCHIAL ULTRASOUND;  Surgeon: Lorin Glass, MD;  Location: [facility] ENDOSCOPY;  Service: Pulmonary;  Laterality: N/A;    Medications: Prior to Admission medications   Medication Sig Start Date End Date Taking? Authorizing Provider  apixaban Everlene Balls)  5 MG TABS tablet Take 1 tablet (5 mg total) by mouth 2 (two) times daily. 07/14/23   Meredeth Ide, MD  aspirin EC 81 MG tablet Take 1 tablet (81 mg total) by mouth daily. Swallow whole. 07/15/23   Meredeth Ide, MD  atorvastatin (LIPITOR) 40 MG tablet Take 1 tablet (40 mg total) by mouth daily. 06/10/23   Marinda Elk, MD  carvedilol (COREG) 3.125 MG  tablet Take 1 tablet (3.125 mg total) by mouth 2 (two) times daily. 06/10/23   Marinda Elk, MD  empagliflozin (JARDIANCE) 10 MG TABS tablet Take 1 tablet (10 mg total) by mouth daily. 07/14/23   Meredeth Ide, MD  furosemide (LASIX) 40 MG tablet Take 1 tablet (40 mg total) by mouth daily. 07/14/23 08/14/23  Meredeth Ide, MD  nicotine polacrilex (NICORETTE) 2 MG gum Take 1 each (2 mg total) by mouth as needed for smoking cessation. 07/14/23   Meredeth Ide, MD  oxyCODONE-acetaminophen (PERCOCET) 5-325 MG tablet Take 1 tablet by mouth every 6 (six) hours as needed. Patient not taking: Reported on 08/12/2023 07/10/23   Bethann Berkshire, MD  sacubitril-valsartan (ENTRESTO) 24-26 MG Take 1 tablet by mouth 2 (two) times daily. 07/15/23   Amin, Ankit C, MD  spironolactone (ALDACTONE) 25 MG tablet Take 0.5 tablets (12.5 mg total) by mouth daily. 07/15/23   Meredeth Ide, MD  triamcinolone (KENALOG) 0.025 % cream Apply 1 Application topically 2 (two) times daily. 07/29/23   Rema Fendt, NP    Allergies:   Allergies  Allergen Reactions   Shellfish Allergy Anaphylaxis   Peanut (Diagnostic) Itching    Social History:  reports that he has been smoking cigarettes. He has a 41 pack-year smoking history. He has been exposed to tobacco smoke. He has never used smokeless tobacco. He reports current alcohol use of about 63.0 standard drinks of alcohol per week. He reports current drug use. Drugs: "Crack" cocaine and Cocaine.  Family History: Family History  Problem Relation Age of Onset   Diabetes Mother     Physical Exam: Vitals:   09/03/23 2140  BP: (!) 157/118  Pulse: 100  Resp: 16  Temp: 97.6 F (36.4 C)  TempSrc: Oral  SpO2: 96%    General: Arousable, confused, decreased level of consciousness Eyes: Pink conjunctiva, no scleral icterus ENT: Moist oral mucosa, neck supple, no thyromegaly Lungs: clear, no wheeze, no crackles, no use of accessory muscles Cardiovascular: RRR, no  regurgitation, no gallops,  no JVD Abdomen: soft, positive BS, NTND, not an acute abdomen GU: not examined Neuro: CN II - XII grossly intact, sensation intact Musculoskeletal: Does not appear is moving the left side Skin: no rash, no subcutaneous crepitation, no decubitus Psych: Altered patient  Labs on Admission:  Recent Labs    09/03/23 1634  NA 137  K 4.2  CL 106  CO2 20*  GLUCOSE 97  BUN 18  CREATININE 1.26*  CALCIUM 9.2   Recent Labs    09/03/23 1634  AST 28  ALT 8  ALKPHOS 115  BILITOT 1.1  PROT 7.3  ALBUMIN 3.4*    Recent Labs    09/03/23 1634  WBC 8.1  NEUTROABS 5.7  HGB 11.3*  HCT 37.2*  MCV 92.3  PLT 235    Micro Results: Recent Results (from the past 240 hours)  Resp panel by RT-PCR (RSV, Flu A&B, Covid) Anterior Nasal Swab     Status: None   Collection Time: 09/03/23  2:43 PM  Specimen: Anterior Nasal Swab  Result Value Ref Range Status   SARS Coronavirus 2 by RT PCR NEGATIVE NEGATIVE Final    Comment: (NOTE) SARS-CoV-2 target nucleic acids are NOT DETECTED.  The SARS-CoV-2 RNA is generally detectable in upper respiratory specimens during the acute phase of infection. The lowest concentration of SARS-CoV-2 viral copies this assay can detect is 138 copies/mL. A negative result does not preclude SARS-Cov-2 infection and should not be used as the sole basis for treatment or other patient management decisions. A negative result may occur with  improper specimen collection/handling, submission of specimen other than nasopharyngeal swab, presence of viral mutation(s) within the areas targeted by this assay, and inadequate number of viral copies(<138 copies/mL). A negative result must be combined with clinical observations, patient history, and epidemiological information. The expected result is Negative.  Fact Sheet for Patients:  BloggerCourse.com  Fact Sheet for Healthcare Providers:   SeriousBroker.it  This test is no t yet approved or cleared by the [place] FDA and  has been authorized for detection and/or diagnosis of SARS-CoV-2 by FDA under an Emergency Use Authorization (EUA). This EUA will remain  in effect (meaning this test can be used) for the duration of the COVID-19 declaration under Section 564(b)(1) of the Act, 21 U.S.C.section 360bbb-3(b)(1), unless the authorization is terminated  or revoked sooner.       Influenza A by PCR NEGATIVE NEGATIVE Final   Influenza B by PCR NEGATIVE NEGATIVE Final    Comment: (NOTE) The Xpert Xpress SARS-CoV-2/FLU/RSV plus assay is intended as an aid in the diagnosis of influenza from Nasopharyngeal swab specimens and should not be used as a sole basis for treatment. Nasal washings and aspirates are unacceptable for Xpert Xpress SARS-CoV-2/FLU/RSV testing.  Fact Sheet for Patients: BloggerCourse.com  Fact Sheet for Healthcare Providers: SeriousBroker.it  This test is not yet approved or cleared by the [place] FDA and has been authorized for detection and/or diagnosis of SARS-CoV-2 by FDA under an Emergency Use Authorization (EUA). This EUA will remain in effect (meaning this test can be used) for the duration of the COVID-19 declaration under Section 564(b)(1) of the Act, 21 U.S.C. section 360bbb-3(b)(1), unless the authorization is terminated or revoked.     Resp Syncytial Virus by PCR NEGATIVE NEGATIVE Final    Comment: (NOTE) Fact Sheet for Patients: BloggerCourse.com  Fact Sheet for Healthcare Providers: SeriousBroker.it  This test is not yet approved or cleared by the [place] FDA and has been authorized for detection and/or diagnosis of SARS-CoV-2 by FDA under an Emergency Use Authorization (EUA). This EUA will remain in effect (meaning this test can be used) for  the duration of the COVID-19 declaration under Section 564(b)(1) of the Act, 21 U.S.C. section 360bbb-3(b)(1), unless the authorization is terminated or revoked.  Performed at [facility], [address] [place], [place] [address]      Radiological Exams on Admission: MR BRAIN WO CONTRAST Result Date: 09/03/2023 CLINICAL DATA:  Neuro deficit, acute, stroke suspected. Weakness. Unable to walk. EXAM: MRI HEAD WITHOUT CONTRAST TECHNIQUE: Multiplanar, multiecho pulse sequences of the brain and surrounding structures were obtained without intravenous contrast. COMPARISON:  CT same day.  MRI 05/26/2023 FINDINGS: Brain: Diffusion imaging shows a probable punctate acute infarction within the inferior cerebellum on the left. There is a 3 cm region of acute cortical and subcortical infarction on the right affecting the deep insula and parietal lobe. No evidence of hemorrhagic transformation. There are 2 or 3 punctate acute  infarctions in the right medial occipital cortex. There are a few clustered punctate acute infarctions in the left frontal vertex region. These findings suggest embolic disease from the heart or ascending aorta. Otherwise, there are few old small vessel cerebellar infarctions. Cerebral hemispheres otherwise show old bilateral frontal and parietal cortical and subcortical infarctions. No evidence of intra-axial mass lesion. 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 Extra canalicular component measures 11 mm, without apparent mass-effect upon the brain. No hydrocephalus. No subdural collection. Vascular: Major vessels at the base of the brain show flow. Skull and upper cervical spine: Negative Sinuses/Orbits: Clear/normal Other: None IMPRESSION: 1. 3 cm region of acute cortical and subcortical infarction on the right affecting the deep insula and parietal lobe consistent with embolic infarction in the right MCA  territory. 2 or 3 punctate acute infarctions in the right medial occipital cortex. Few clustered punctate acute infarctions in the left frontal vertex region. Probable punctate acute infarction in the inferior left cerebellum. These findings suggest embolic disease from the heart or ascending aorta. 2. Old bilateral frontal and parietal cortical and subcortical infarctions. 3. 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 Extra canalicular component measures 11 mm, without apparent mass-effect upon the brain. Electronically Signed   By: Paulina Fusi M.D.   On: 09/03/2023 22:56   CT Head Wo Contrast Result Date: 09/03/2023 CLINICAL DATA:  Syncope/presyncope. Cerebrovascular cause suspected. EXAM: CT HEAD WITHOUT CONTRAST TECHNIQUE: Contiguous axial images were obtained from the base of the skull through the vertex without intravenous contrast. RADIATION DOSE REDUCTION: This exam was performed according to the departmental dose-optimization program which includes automated exposure control, adjustment of the mA and/or kV according to patient size and/or use of iterative reconstruction technique. COMPARISON:  MRI 05/26/2023 FINDINGS: Brain: Generalized atrophy. Numerous old cortical infarctions including in both parietal lobes and in the left frontal lobe. No evidence of acute infarction, mass lesion, hemorrhage, hydrocephalus or extra-axial collection. Incidental mega cisterna magna. Known left vestibular schwannoma. Vascular: No abnormal vascular finding. Skull: Negative Sinuses/Orbits: Clear/normal Other: None IMPRESSION: No acute CT finding. Generalized atrophy. Numerous old cortical infarctions including in both parietal lobes and in the left frontal lobe. Known left vestibular schwannoma without apparent change. Electronically Signed   By: Paulina Fusi M.D.   On: 09/03/2023 17:33   DG Chest Portable 1 View Result Date: 09/03/2023 CLINICAL DATA:   Concern for pneumonia. EXAM: PORTABLE CHEST 1 VIEW COMPARISON:  07/11/2023. FINDINGS: There is left retrocardiac airspace opacity obscuring the left hemidiaphragm, descending thoracic aorta and blunting the left lateral costophrenic angle, suggesting combination of left lung atelectasis and/or consolidation with pleural effusion. Bilateral lung fields are otherwise clear. Right lateral costophrenic angle is clear. No pneumothorax on either side. Stable mildly enlarged cardio-mediastinal silhouette. No acute osseous abnormalities. The soft tissues are within normal limits. IMPRESSION: *Left retrocardiac opacity, as described above. Electronically Signed   By: Jules Schick M.D.   On: 09/03/2023 16:09    Assessment/Plan Present on Admission:  Acute CVA -Likely secondary to uncontrolled hypertension and cocaine use -Continue aspirin daily -TIA order set initiated -CTA head and neck, 2D echo in a.m. ordered -CT head negative.  MRI brain positive for CVA -PT/OT.  Patient passed swallow.  Diet ordered -Permissive hypertension SBP>220, DBP>120 -Neurochecks every 2 hours x 12, then every 4 hours -Lipid panel in a.m.  Add high dose statin if LDL> 70 -ABG done pH  7.38   Elevated troponin -639=>444.  Downtrending.  No reports of chest pains.  EKG without acute ischemic changes.   -Patient was on daily Eliquis. -Patient with severe cardiac disease, EF<20%.   -2D echo in AM. -Will place cardiac consult.  -Doubt acute ischemic process.   HFrEF //  Uncontrolled HTN //   -BNP 2,078, at baseline -Entresto, Coreg, Lasix, spironolactone on hold (permissive hypertension) -Avoid beta-blockers with cocaine use   Atrial fibrillation -Eliquis and Coreg on hold   Ongoing alcohol abuse -CIWA protocol ordered.  No benzos ordered.   Hyperlipidemia -Statin resumed   Ongoing cocaine abuse (HCC)  Craig Schmidt 09/04/2023, 12:48 AM

## 2023-09-04 NOTE — TOC CAGE-AID Note (Signed)
Transition of Care [facility]) - CAGE-AID Screening   Patient Details  Name: SMITTY ACKERLEY MRN: [id number] Date of Birth: 1970/09/01  Transition of Care [facility]) CM/SW Contact:    [name], RN Phone Number: 09/04/2023, 12:20 PM   Clinical Narrative:  Pt provided inpatient/ outpatient drug and alcohol substance abuse resources.   CAGE-AID Screening:    Have You Ever Felt You Ought to Cut Down on Your Drinking or Drug Use?: Yes Have People Annoyed You By Critizing Your Drinking Or Drug Use?: Yes Have You Felt Bad Or Guilty About Your Drinking Or Drug Use?: No Have You Ever Had a Drink or Used Drugs First Thing In The Morning to Steady Your Nerves or to Get Rid of a Hangover?: No CAGE-AID Score: 2  Substance Abuse Education Offered: Yes (provided)  Substance abuse interventions: Patient Counseling

## 2023-09-04 NOTE — Evaluation (Signed)
Occupational Therapy Evaluation Patient Details Name: Craig Schmidt MRN: [id number] DOB: 1970/12/05 Today's Date: 09/04/2023   History of Present Illness   53 y.o. male presenting to ED 2/12 with L sided weakness and confusion. MRI found a right parietal infarct as well as other small punctate infarcts. 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     Clinical Impressions PTA pt lived in an apartment with a friend who assisted him with bathing and dressing, was using a rollator for mobility. Pt with new L sided weakness as well as a hx of R sided deficits from prior CVA which limit his ability to engage in ADLs, currently total A for all ADLs due to listed deficits below. Pt would likely need AE to promote independence in self feeding. OT to continue following pt acutely to address listed deficits and help transition to next level of care. Patient has the potential to reach Mod I and demos the ability to tolerate 3 hours of therapy. Pt would benefit from an intensive rehab program to help maximize functional independence.      If plan is discharge home, recommend the following:   A lot of help with walking and/or transfers;A lot of help with bathing/dressing/bathroom;Assistance with cooking/housework;Direct supervision/assist for medications management;Assist for transportation     Functional Status Assessment   Patient has had a recent decline in their functional status and demonstrates the ability to make significant improvements in function in a reasonable and predictable amount of time.     Equipment Recommendations   None recommended by OT (TBD at next level of care)     Recommendations for Other Services         Precautions/Restrictions   Precautions Precautions: Fall Restrictions Weight Bearing Restrictions Per Provider Order: No      Mobility Bed Mobility Overal bed mobility: Needs Assistance Bed Mobility: Supine to Sit, Sit to Supine     Supine to sit: Min assist     General bed mobility comments: Min A assist with repositioning BLEs to EOB, pt able to propel trunk into upright sitting position.    Transfers Overall transfer level: Needs assistance Equipment used: 2 person hand held assist Transfers: Sit to/from Stand Sit to Stand: Min assist, +2 safety/equipment           General transfer comment: Cues for erect standing posture and neutral head positioning. Able to ambualte in hallway with small shuffle steps and Mod A +2 HHA for safety/balance      Balance Overall balance assessment: Needs assistance Sitting-balance support: Feet supported, No upper extremity supported Sitting balance-Leahy Scale: Fair     Standing balance support: Bilateral upper extremity supported, During functional activity Standing balance-Leahy Scale: Poor Standing balance comment: reliant on ext support                           ADL either performed or assessed with clinical judgement   ADL   Eating/Feeding: Total assistance;Sitting   Grooming: Total assistance;Sitting   Upper Body Bathing: Total assistance;Sitting   Lower Body Bathing: Total assistance;Sitting/lateral leans   Upper Body Dressing : Total assistance;Sitting   Lower Body Dressing: Total assistance;Sitting/lateral leans   Toilet Transfer: +2 for safety/equipment;Moderate assistance           Functional mobility during ADLs: +2 for physical assistance;+2 for safety/equipment;Moderate assistance General  ADL Comments: Pt overall total A at this time for ADLs, also needing assist with feeding due to bilat UE deficits.     Vision Patient Visual Report: Peripheral vision impairment Vision Assessment?: Vision impaired- to be further tested in functional context Additional Comments: Pt able to scan R around room with bilat eyes, not  able to see word board on L side when sitting EOB, undershoots when reaching for items on L side.     Perception Perception: Impaired Preception Impairment Details: Inattention/Neglect Perception-Other Comments: L inattention- forgot washcloth was in L hand   Praxis Praxis: Not tested       Pertinent Vitals/Pain Pain Assessment Pain Assessment: Faces Faces Pain Scale: Hurts a little bit Pain Location: generalized with movement Pain Descriptors / Indicators: Grimacing, Guarding, Moaning Pain Intervention(s): Monitored during session, Limited activity within patient's tolerance, Repositioned     Extremity/Trunk Assessment Upper Extremity Assessment Upper Extremity Assessment: RUE deficits/detail;LUE deficits/detail;Generalized weakness RUE Deficits / Details: ROM WFL, able to touch top of head with a bit of cervical flexion. Prior CVA deficits, generally weak with a weak grasp. RUE Coordination: decreased fine motor LUE Deficits / Details: lifts against gravity- able to touch nose level, L inattention, impaired coordination but isolated movements of fingers intact. Weak grip strength when tested but when reaching for cup pt not able to control pressure when going to squeeze. LUE Sensation: decreased light touch LUE Coordination: decreased fine motor   Lower Extremity Assessment RLE Deficits / Details: weakness from historic stroke, ROM WFL, generalized weakness RLE Coordination: decreased fine motor;decreased gross motor LLE Deficits / Details: new generalized weakness from recent CVA ROM WFL, LLE Coordination: decreased fine motor;decreased gross motor   Cervical / Trunk Assessment Cervical / Trunk Assessment: Other exceptions   Communication Communication Communication: Impaired Factors Affecting Communication: Reduced clarity of speech;Difficulty expressing self (neurogenic stuttering)   Cognition Arousal: Lethargic Behavior During Therapy: Flat affect Cognition: Difficult  to assess Difficult to assess due to: Impaired communication                             Following commands: Impaired Following commands impaired: Follows one step commands with increased time     Cueing  General Comments   Cueing Techniques: Verbal cues;Gestural cues;Tactile cues;Visual cues      Exercises     Shoulder Instructions      Home Living Family/patient expects to be discharged to:: Private residence Living Arrangements: Non-relatives/Friends Available Help at Discharge: Family;Available 24 hours/day Type of Home: Apartment (lives in low income housing/apartment) Home Access: Level entry     Home Layout: One level     Bathroom Shower/Tub: Chief Strategy Officer: Standard     Home Equipment: Rollator (4 wheels);Gilmer Mor - single point      Lives With: Friend(s) (lives with [name]- not sure if this is significant other or friend)    Prior Functioning/Environment               Mobility Comments: uses cane and Rollator for ambulation, reports his old Rollator is broken and is requesting new one ADLs Comments: male friend [name] reports she continues to help him with ADLS    OT Problem List: Decreased strength;Impaired balance (sitting and/or standing);Impaired vision/perception;Impaired UE functional use;Decreased coordination;Impaired sensation   OT Treatment/Interventions: Self-care/ADL training;DME and/or AE instruction;Balance training;Therapeutic activities;Therapeutic exercise;Neuromuscular education;Patient/family education;Visual/perceptual remediation/compensation      OT Goals(Current goals can be found in the care plan  section)   Acute Rehab OT Goals Patient Stated Goal: To get better OT Goal Formulation: With patient/family Time For Goal Achievement: 09/18/23 Potential to Achieve Goals: Good ADL Goals Pt Will Perform Grooming: sitting;with adaptive equipment;with min assist Pt Will Perform Upper Body Bathing:  sitting;with min assist;with adaptive equipment Pt Will Transfer to Toilet: ambulating;with contact guard assist Pt/caregiver will Perform Home Exercise Program: Increased strength;Increased ROM;Left upper extremity;With written HEP provided Additional ADL Goal #2: Pt will utilize compensatory strategies to locate 3/3 objects on his L side for ADLs   OT Frequency:  Min 1X/week    Co-evaluation              AM-PAC OT "6 Clicks" Daily Activity     Outcome Measure Help from another person eating meals?: Total Help from another person taking care of personal grooming?: Total Help from another person toileting, which includes using toliet, bedpan, or urinal?: Total Help from another person bathing (including washing, rinsing, drying)?: Total Help from another person to put on and taking off regular upper body clothing?: Total Help from another person to put on and taking off regular lower body clothing?: Total 6 Click Score: 6   End of Session Equipment Utilized During Treatment: Gait belt Nurse Communication: Mobility status  Activity Tolerance: Patient tolerated treatment well Patient left: in chair;with call bell/phone within reach;with chair alarm set;with family/visitor present  OT Visit Diagnosis: Muscle weakness (generalized) (M62.81);Unsteadiness on feet (R26.81);Other abnormalities of gait and mobility (R26.89);Hemiplegia and hemiparesis;Cognitive communication deficit (R41.841) Symptoms and signs involving cognitive functions: Cerebral infarction Hemiplegia - Right/Left: Left Hemiplegia - caused by: Cerebral infarction (R parietal and small punctate)                Time: 1352-1430 OT Time Calculation (min): 38 min Charges:  OT General Charges $OT Visit: 1 Visit OT Evaluation $OT Eval Moderate Complexity: 1 Mod OT Treatments $Therapeutic Activity: 23-37 mins  09/04/2023  AB, OTR/L  Acute Rehabilitation Services  Office: [phone]   KOHLE WINNER 09/04/2023, 4:24 PM

## 2023-09-05 DIAGNOSIS — I639 Cerebral infarction, unspecified: Secondary | ICD-10-CM | POA: Diagnosis not present

## 2023-09-05 LAB — CBC WITH DIFFERENTIAL/PLATELET
Abs Immature Granulocytes: 0.02 10*3/uL (ref 0.00–0.07)
Basophils Absolute: 0 10*3/uL (ref 0.0–0.1)
Basophils Relative: 1 %
Eosinophils Absolute: 0.3 10*3/uL (ref 0.0–0.5)
Eosinophils Relative: 5 %
HCT: 33.5 % — ABNORMAL LOW (ref 39.0–52.0)
Hemoglobin: 10.5 g/dL — ABNORMAL LOW (ref 13.0–17.0)
Immature Granulocytes: 0 %
Lymphocytes Relative: 21 %
Lymphs Abs: 1.5 10*3/uL (ref 0.7–4.0)
MCH: 28.2 pg (ref 26.0–34.0)
MCHC: 31.3 g/dL (ref 30.0–36.0)
MCV: 90.1 fL (ref 80.0–100.0)
Monocytes Absolute: 0.5 10*3/uL (ref 0.1–1.0)
Monocytes Relative: 6 %
Neutro Abs: 4.8 10*3/uL (ref 1.7–7.7)
Neutrophils Relative %: 67 %
Platelets: 208 10*3/uL (ref 150–400)
RBC: 3.72 MIL/uL — ABNORMAL LOW (ref 4.22–5.81)
RDW: 16.5 % — ABNORMAL HIGH (ref 11.5–15.5)
WBC: 7.2 10*3/uL (ref 4.0–10.5)
nRBC: 0 % (ref 0.0–0.2)

## 2023-09-05 LAB — BASIC METABOLIC PANEL
Anion gap: 9 (ref 5–15)
BUN: 23 mg/dL — ABNORMAL HIGH (ref 6–20)
CO2: 20 mmol/L — ABNORMAL LOW (ref 22–32)
Calcium: 8.8 mg/dL — ABNORMAL LOW (ref 8.9–10.3)
Chloride: 110 mmol/L (ref 98–111)
Creatinine, Ser: 1.65 mg/dL — ABNORMAL HIGH (ref 0.61–1.24)
GFR, Estimated: 50 mL/min — ABNORMAL LOW (ref >60)
Glucose, Bld: 111 mg/dL — ABNORMAL HIGH (ref 70–99)
Potassium: 4.4 mmol/L (ref 3.5–5.1)
Sodium: 139 mmol/L (ref 135–145)

## 2023-09-05 MED ORDER — SODIUM CHLORIDE 0.9 % IV SOLN: INTRAVENOUS | Status: AC

## 2023-09-05 NOTE — NC FL2 (Addendum)
[place] MEDICAID FL2 LEVEL OF CARE FORM     IDENTIFICATION  Patient Name: Craig Schmidt Birthdate: 1970-10-23 Sex: male Admission Date (Current Location): 09/03/2023  [facility] and [place] Number:  Chiropodist and Address:  [facility], [address], [place], [place] [address]      Provider Number: [id number]  Attending Physician Name and Address:  Fran Lowes, DO  Relative Name and Phone Number:       Current Level of Care: Hospital Recommended Level of Care: Skilled Nursing Facility Prior Approval Number:    Date Approved/Denied:   PASRR Number: [id number]  Discharge Plan: SNF    Current Diagnoses: Patient Active Problem List   Diagnosis Date Noted   Hypertension, malignant 09/04/2023   HFrEF (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) 09/04/2023   Coronary artery disease 06/27/2023   History of CVA (cerebrovascular accident) 06/27/2023   Substance use disorder 06/27/2023   Atypical chest pain 06/21/2023   Pulmonary infiltrate 06/21/2023   Multifocal pneumonia 06/11/2023   Hyperkalemia 06/11/2023   Hyponatremia 06/11/2023   Normocytic anemia 06/11/2023   Thrombocytosis 06/11/2023   Troponin I above reference range 05/25/2023   CAP (community acquired pneumonia) 05/25/2023   Pneumonia 05/25/2023   Chronic combined systolic and diastolic CHF (congestive heart failure) (HCC) 12/03/2022   Malnutrition of moderate degree 11/30/2022   Acute on chronic combined systolic and diastolic CHF (congestive heart failure) (HCC) 11/29/2022   Acute on chronic combined systolic (congestive) and diastolic (congestive) heart failure (HCC) 11/28/2022   ACS (acute coronary syndrome) (HCC) 11/28/2022   Back pain 11/28/2022   Essential hypertension 11/28/2022   Hyperlipidemia 11/28/2022   Noncompliance with medication regimen 11/28/2022   ST elevation myocardial infarction (STEMI) of inferolateral wall, subsequent episode of care  (HCC) 12/19/2021   ST elevation myocardial infarction (STEMI) (HCC)    Ischemic cardiomyopathy    Cocaine abuse (HCC)    Smoker    Alcohol abuse    Acute CVA (cerebrovascular accident) (HCC) 09/08/2021   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   Tear of MCL (medial collateral ligament) of knee 05/20/2015   Protein-calorie malnutrition, severe 05/17/2015   Lactic acidosis 05/15/2015   Left knee pain 05/15/2015   Lung nodules 05/15/2015   Hypoglycemia 05/14/2015   Hypothermia 05/14/2015   Acute encephalopathy 05/14/2015   Cocaine abuse with intoxication (HCC) 05/14/2015   Alcohol intoxication in active alcoholic (HCC) 05/14/2015   Sepsis (HCC) 05/14/2015   Homeless 05/14/2015    Orientation RESPIRATION BLADDER Height & Weight     Self, Time, Situation, Place  Normal Incontinent Weight: 150 lb (68 kg) Height:  5\' 7"  (170.2 cm)  BEHAVIORAL SYMPTOMS/MOOD NEUROLOGICAL BOWEL NUTRITION STATUS      Continent Diet (Heart)  AMBULATORY STATUS COMMUNICATION OF NEEDS Skin   Extensive Assist Verbally Normal                       Personal Care Assistance Level of Assistance  Bathing, Feeding, Dressing Bathing Assistance: Maximum assistance Feeding assistance: Maximum assistance Dressing Assistance: Maximum assistance     Functional Limitations Info             SPECIAL CARE FACTORS FREQUENCY  PT (By licensed PT), OT (By licensed OT)     PT Frequency: 5x/week OT Frequency: 5x/week            Contractures      Additional Factors Info  Code Status, Allergies Code Status Info: Full  Allergies Info: Shellfish Allergy, Peanut (Diagnostic)           Current Medications (09/05/2023):  This is the current hospital active medication list Current Facility-Administered Medications  Medication Dose Route Frequency Provider Last Rate Last Admin   0.9 %  sodium chloride infusion   Intravenous Continuous Eduard Clos, MD 40 mL/hr at 09/05/23 0734 Infusion  Verify at 09/05/23 0734   acetaminophen (TYLENOL) tablet 650 mg  650 mg Oral Q4H PRN Gery Pray, MD   650 mg at 09/04/23 2008   Or   acetaminophen (TYLENOL) 160 MG/5ML solution 650 mg  650 mg Per Tube Q4H PRN Gery Pray, MD       Or   acetaminophen (TYLENOL) suppository 650 mg  650 mg Rectal Q4H PRN Joneen Roach, Debby, MD       apixaban (ELIQUIS) tablet 5 mg  5 mg Oral BID Swayze, Ava, DO   5 mg at 09/05/23 1057   aspirin EC tablet 81 mg  81 mg Oral Daily Crosley, Debby, MD   81 mg at 09/05/23 1056   atorvastatin (LIPITOR) tablet 40 mg  40 mg Oral Daily Crosley, Debby, MD   40 mg at 09/05/23 1056   folic acid (FOLVITE) tablet 1 mg  1 mg Oral Daily Crosley, Debby, MD   1 mg at 09/05/23 1057   hydrALAZINE (APRESOLINE) injection 5 mg  5 mg Intravenous Q4H PRN Crosley, Debby, MD       influenza vac split trivalent PF (FLULAVAL) injection 0.5 mL  0.5 mL Intramuscular Tomorrow-1000 Swayze, Ava, DO       multivitamin with minerals tablet 1 tablet  1 tablet Oral Daily Crosley, Debby, MD   1 tablet at 09/05/23 1057   nicotine (NICODERM CQ - dosed in mg/24 hours) patch 14 mg  14 mg Transdermal Daily Crosley, Debby, MD   14 mg at 09/05/23 1059   pneumococcal 20-valent conjugate vaccine (PREVNAR 20) injection 0.5 mL  0.5 mL Intramuscular Tomorrow-1000 Swayze, Ava, DO       senna-docusate (Senokot-S) tablet 1 tablet  1 tablet Oral QHS PRN Crosley, Debby, MD       thiamine (VITAMIN B1) tablet 100 mg  100 mg Oral Daily Crosley, Debby, MD   100 mg at 09/05/23 1057   Or   thiamine (VITAMIN B1) injection 100 mg  100 mg Intravenous Daily Gery Pray, MD         Discharge Medications: Please see discharge summary for a list of discharge medications.  Relevant Imaging Results:  Relevant Lab Results:   Additional Information SSN:[id number]  Antion Felipa Emory, Student-Social Work

## 2023-09-05 NOTE — Assessment & Plan Note (Signed)
Continue lipitor 40mg  daily.

## 2023-09-05 NOTE — Plan of Care (Signed)

## 2023-09-05 NOTE — Assessment & Plan Note (Signed)
The patient has suffered a right parietal infact with a occlusion of the right MCA branch.

## 2023-09-05 NOTE — Progress Notes (Signed)
Progress Note   Patient: Craig Schmidt ZOX:[id number] DOB: 03-18-1971 DOA: 09/03/2023     1 DOS: the patient was seen and examined on 09/05/2023   Brief hospital course: The patient is a 53 yr old man who presented to [facility] ED on 09/03/2023 with complaints of loss of vision, weakness, and inability to stand. He had been admitted to [facility] 07/09/2024 for acute systolic heart failure, and 06/26/11/12/ for multifocal pneumonia with necrotizing component s/p BAL with negative bacterial culture and AFB/fungal cultures.    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   CT head was negative, and the patient left AMA. He was unable to move his left arm at this time, and this was pointed out to the patient. Ultimately he did come back to have the MRI which confirmed a stroke. Neurology was contacted. The patient was given an aspirin and admitted to a telemetry bed. The patient's eliquis was held. CXR demonstrated left retrocardiac opacity. QTc was 519.   MRI demonstrated a large 3 cm right parietal and insular embolic infarct along with 3 small punctate infarcts in the right medial occipital lobes and left frontal vertex and left inferior cerebellum. CTA of the head/neck demonstrated a right MCA branch occlusion corresponding to the infarct.   The patient continues to have significant left arm weakness and dysarthria. He is being considered for CIR. He is not likely to be a candidate for SNF.   Assessment and Plan: Alcohol abuse Pt is being monitored for withdrawal. He has been advised that avoidance of alcohol is critical to his avoiding a catastrophic CVA in the future.  Acute CVA (cerebrovascular accident) [facility]) The patient has suffered a right parietal infact with a occlusion of the right MCA  branch.   Cocaine abuse (HCC) Pt has been advised that he must stop cocaine use to avoid catastrophic CVA in the future.  Essential hypertension Diastolic pressures are in the 811'B. Will add hydrochlorothiazide.  HFrEF (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) EF less than 20%.  Hyperlipidemia Continue lipitor 40 mg daily.  Hypertension, malignant Improved.  Malnutrition of moderate degree Nutrition consulted.     Subjective: The patient is resting comfortably.   Physical Exam: Vitals:   09/05/23 0734 09/05/23 1152 09/05/23 1210 09/05/23 1512  BP: (!) 136/107 (!) 139/112 (!) 140/106 (!) 143/112  Pulse: 96 94  93  Resp: 20 20 18 18   Temp: (!) 97.5 F (36.4 C) 98.3 F (36.8 C)  98.1 F (36.7 C)  TempSrc: Oral Axillary  Oral  SpO2: 100% 100% 100% 95%  Weight:      Height:       Exam:  Constitutional:  The patient is awake, alert, and oriented x 3. No acute distress. Respiratory:  No increased work of breathing. No wheezes, rales, or rhonchi No tactile fremitus Cardiovascular:  Regular rate and rhythm No murmurs, ectopy, or gallups. No lateral PMI. No thrills. Abdomen:  Abdomen is soft, non-tender, non-distended No hernias, masses, or organomegaly Normoactive bowel sounds.  Musculoskeletal:  No cyanosis, clubbing, or edema Skin:  No rashes, lesions, ulcers palpation of skin: no induration or nodules Neurologic:  CN 2-12 intact Sensation all 4 extremities intact Psychiatric:  Mental status Mood, affect appropriate Orientation to person, place, time  judgment and insight appear intact  Data  Reviewed:  CBC BMP  Family Communication: Friend is at bedside.  Disposition: Status is: Inpatient Remains inpatient appropriate because:   Planned Discharge Destination: Rehab    Time spent: 34 minutes  Author: Chick Cousins, DO 09/05/2023 7:29 PM  For on call review [email or web address].

## 2023-09-05 NOTE — TOC Progression Note (Signed)
Transition of Care [facility]) - Progression Note    Patient Details  Name: ROLAND LIPKE MRN: [id number] Date of Birth: 11/26/1970  Transition of [facility]) CM/SW Contact  Baldemar Lenis, [place] Phone Number: 09/05/2023, 3:11 PM  Clinical Narrative:   Awaiting CIR screen due to OT evaluation, but CIR unlikely to offer with patient's insurance. CSW completed referral to fax out for SNF search, but SNF placement is also unlikely given patient's insurance and substance abuse history. CSW to follow.    Expected Discharge Plan: Skilled Nursing Facility Barriers to Discharge: Continued Medical Work up, English as a second language teacher, Inadequate or no insurance, Active Substance Use - Placement  Expected Discharge Plan and Services       Living arrangements for the past 2 months: Apartment                                       Social Determinants of Health (SDOH) Interventions SDOH Screenings   Food Insecurity: Food Insecurity Present (09/04/2023)  Housing: High Risk (09/04/2023)  Transportation Needs: Unmet Transportation Needs (09/04/2023)  Utilities: Not At Risk (09/04/2023)  Recent Concern: Utilities - At Risk (08/12/2023)  Alcohol Screen: High Risk (09/19/2021)  Depression (PHQ2-9): High Risk (06/13/2022)  Financial Resource Strain: High Risk (07/19/2023)  Stress: Stress Concern Present (10/16/2022)  Tobacco Use: High Risk (09/04/2023)    Readmission Risk Interventions    07/12/2023    1:06 PM 06/28/2023    1:27 PM 06/21/2023   12:50 PM  Readmission Risk Prevention Plan  Transportation Screening Complete Complete Complete  PCP or Specialist Appt within 3-5 Days  Complete   HRI or Home Care Consult  Complete   Social Work Consult for Recovery Care Planning/Counseling  Complete   Palliative Care Screening  Not Applicable   Medication Review Oceanographer) Complete Complete Complete  PCP or Specialist appointment within 3-5 days of discharge Complete  Complete  HRI or  Home Care Consult Complete  Complete  SW Recovery Care/Counseling Consult Complete  Complete  Palliative Care Screening Not Applicable  Not Applicable  Skilled Nursing Facility Not Applicable  Not Applicable

## 2023-09-05 NOTE — Progress Notes (Addendum)
Inpatient Rehab Admissions Coordinator:   Per OT recommendations pt was screened for CIR by Estill Dooms, PT, DPT.  Chart reviewed .  I will need to review with MD prior to placing a consult.   Estill Dooms, PT, DPT Admissions Coordinator [phone] 09/05/23  3:45 PM

## 2023-09-05 NOTE — Plan of Care (Signed)
  Problem: Education: Goal: Knowledge of disease or condition will improve Outcome: Progressing   Problem: Ischemic Stroke/TIA Tissue Perfusion: Goal: Complications of ischemic stroke/TIA will be minimized Outcome: Progressing   Problem: Coping: Goal: Will verbalize positive feelings about self Outcome: Progressing

## 2023-09-05 NOTE — Assessment & Plan Note (Signed)
Pt is being monitored for withdrawal. He has been advised that avoidance of alcohol is critical to his avoiding a catastrophic CVA in the future.

## 2023-09-05 NOTE — Assessment & Plan Note (Signed)
Nutrition consulted.

## 2023-09-05 NOTE — Assessment & Plan Note (Signed)
Improved

## 2023-09-05 NOTE — Assessment & Plan Note (Signed)
Pt has been advised that he must stop cocaine use to avoid catastrophic CVA in the future.

## 2023-09-05 NOTE — Assessment & Plan Note (Signed)
EF less than 20%.

## 2023-09-05 NOTE — Assessment & Plan Note (Signed)
Diastolic pressures are in the 829'F. Will add hydrochlorothiazide.

## 2023-09-05 NOTE — Plan of Care (Signed)
  Problem: Education: Goal: Knowledge of disease or condition will improve 09/05/2023 1519 by Carole Binning, RN Outcome: Progressing 09/05/2023 1518 by Carole Binning, RN Outcome: Progressing   Problem: Ischemic Stroke/TIA Tissue Perfusion: Goal: Complications of ischemic stroke/TIA will be minimized 09/05/2023 1519 by Carole Binning, RN Outcome: Progressing 09/05/2023 1518 by Carole Binning, RN Outcome: Progressing   Problem: Coping: Goal: Will verbalize positive feelings about self Outcome: Progressing   Problem: Clinical Measurements: Goal: Ability to maintain clinical measurements within normal limits will improve Outcome: Progressing Goal: Will remain free from infection Outcome: Progressing

## 2023-09-06 DIAGNOSIS — R252 Cramp and spasm: Secondary | ICD-10-CM

## 2023-09-06 DIAGNOSIS — G8194 Hemiplegia, unspecified affecting left nondominant side: Secondary | ICD-10-CM | POA: Diagnosis not present

## 2023-09-06 DIAGNOSIS — I639 Cerebral infarction, unspecified: Secondary | ICD-10-CM | POA: Diagnosis not present

## 2023-09-06 LAB — CBC WITH DIFFERENTIAL/PLATELET
Abs Immature Granulocytes: 0.03 10*3/uL (ref 0.00–0.07)
Basophils Absolute: 0 10*3/uL (ref 0.0–0.1)
Basophils Relative: 0 %
Eosinophils Absolute: 0.3 10*3/uL (ref 0.0–0.5)
Eosinophils Relative: 4 %
HCT: 33.3 % — ABNORMAL LOW (ref 39.0–52.0)
Hemoglobin: 10.5 g/dL — ABNORMAL LOW (ref 13.0–17.0)
Immature Granulocytes: 0 %
Lymphocytes Relative: 23 %
Lymphs Abs: 1.7 10*3/uL (ref 0.7–4.0)
MCH: 28.4 pg (ref 26.0–34.0)
MCHC: 31.5 g/dL (ref 30.0–36.0)
MCV: 90 fL (ref 80.0–100.0)
Monocytes Absolute: 0.3 10*3/uL (ref 0.1–1.0)
Monocytes Relative: 4 %
Neutro Abs: 5.1 10*3/uL (ref 1.7–7.7)
Neutrophils Relative %: 69 %
Platelets: 202 10*3/uL (ref 150–400)
RBC: 3.7 MIL/uL — ABNORMAL LOW (ref 4.22–5.81)
RDW: 16.3 % — ABNORMAL HIGH (ref 11.5–15.5)
WBC: 7.4 10*3/uL (ref 4.0–10.5)
nRBC: 0 % (ref 0.0–0.2)

## 2023-09-06 LAB — BASIC METABOLIC PANEL
Anion gap: 9 (ref 5–15)
BUN: 20 mg/dL (ref 6–20)
CO2: 20 mmol/L — ABNORMAL LOW (ref 22–32)
Calcium: 8.6 mg/dL — ABNORMAL LOW (ref 8.9–10.3)
Chloride: 110 mmol/L (ref 98–111)
Creatinine, Ser: 1.37 mg/dL — ABNORMAL HIGH (ref 0.61–1.24)
GFR, Estimated: >60 mL/min (ref >60)
Glucose, Bld: 126 mg/dL — ABNORMAL HIGH (ref 70–99)
Potassium: 4.6 mmol/L (ref 3.5–5.1)
Sodium: 139 mmol/L (ref 135–145)

## 2023-09-06 MED ORDER — LORAZEPAM 1 MG PO TABS
0.0000 mg | ORAL_TABLET | Freq: Four times a day (QID) | ORAL | Status: AC
  Administered 2023-09-06 – 2023-09-08 (×2): 1 mg via ORAL
  Filled 2023-09-06: qty 1

## 2023-09-06 MED ORDER — LORAZEPAM 1 MG PO TABS
1.0000 mg | ORAL_TABLET | ORAL | Status: AC | PRN
  Administered 2023-09-08 – 2023-09-09 (×2): 1 mg via ORAL
  Filled 2023-09-06 (×3): qty 1

## 2023-09-06 MED ORDER — LORAZEPAM 2 MG/ML IJ SOLN: 1.0000 mg | INTRAMUSCULAR | Status: AC | PRN

## 2023-09-06 MED ORDER — THIAMINE MONONITRATE 100 MG PO TABS: 100.0000 mg | ORAL_TABLET | Freq: Every day | ORAL | Status: DC

## 2023-09-06 MED ORDER — MELATONIN 3 MG PO TABS
3.0000 mg | ORAL_TABLET | Freq: Once | ORAL | Status: AC
  Administered 2023-09-06: 3 mg via ORAL
  Filled 2023-09-06: qty 1

## 2023-09-06 MED ORDER — LORAZEPAM 1 MG PO TABS: 0.0000 mg | ORAL_TABLET | Freq: Two times a day (BID) | ORAL | Status: AC

## 2023-09-06 MED ORDER — FOLIC ACID 1 MG PO TABS: 1.0000 mg | ORAL_TABLET | Freq: Every day | ORAL | Status: DC

## 2023-09-06 MED ORDER — ADULT MULTIVITAMIN W/MINERALS CH
1.0000 | ORAL_TABLET | Freq: Every day | ORAL | Status: DC
  Filled 2023-09-06: qty 1

## 2023-09-06 MED ORDER — THIAMINE HCL 100 MG/ML IJ SOLN: 100.0000 mg | Freq: Every day | INTRAMUSCULAR | Status: DC

## 2023-09-06 NOTE — Progress Notes (Signed)
Occupational Therapy Treatment Patient Details Name: Craig Schmidt MRN: [id number] DOB: 01-06-1971 Today's Date: 09/06/2023   History of present illness 53 y.o. male presenting to ED 2/12 with L sided weakness and confusion. MRI found a right parietal infarct as well as other small punctate infarcts. 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   OT comments  Pt LUE performing more functional tasks than on IE, worked with pt on controlled and refined movement patterns of LUE to perform grooming and feeding. Pt requiring hand over hand assist to progress performance in ADLs, glasses have help with pt's blurry vision. Pt currently MaxA for feeding and grooming with hand over hand assist. Impaired coordination and grasp of BUEs make it challenging for pt to maintain RW, Mod A with efforts of using RW to ambulate. OT to continue to progress pt as able, DC plans remain appropriate for AIR.       If plan is discharge home, recommend the following:  A lot of help with walking and/or transfers;A lot of help with bathing/dressing/bathroom;Assistance with cooking/housework;Direct supervision/assist for medications management;Assist for transportation   Equipment Recommendations  None recommended by OT (TBD at next level of care)    Recommendations for Other Services      Precautions / Restrictions Precautions Precautions: Fall Precaution/Restrictions Comments: reports ocassional falling Restrictions Weight Bearing Restrictions Per Provider Order: No       Mobility Bed Mobility Overal bed mobility: Needs Assistance Bed Mobility: Supine to Sit     Supine to sit: Contact guard     General bed mobility comments: increased time, pt using trunk muscles to pull self to EOB    Transfers Overall transfer level: Needs assistance Equipment used: None Transfers: Sit  to/from Stand Sit to Stand: Mod assist                 Balance Overall balance assessment: Needs assistance Sitting-balance support: Feet supported, No upper extremity supported Sitting balance-Leahy Scale: Fair     Standing balance support: Bilateral upper extremity supported, During functional activity Standing balance-Leahy Scale: Poor Standing balance comment: reliant on ext support                           ADL either performed or assessed with clinical judgement   ADL   Eating/Feeding: Maximal assistance;Sitting;With adaptive utensils Eating/Feeding Details (indicate cue type and reason): graded Hand over hand assist from OT of pt LUE to work on scooping and control of built up spoon from pudding to pt mouth. Blue lidded cup provided Grooming: Standing;Maximal assistance Grooming Details (indicate cue type and reason): Needing assist with washcloth placement and holding it in L hand             Lower Body Dressing: Total assistance;Sitting/lateral leans               Functional mobility during ADLs: Moderate assistance;Rolling walker (2 wheels) General ADL Comments: Worked with pt on fine control of active grasp and release of LUE, reaching for blue lidded cup then waiting on tactile input for controlled grasp and release    Extremity/Trunk Assessment Upper Extremity Assessment LUE Deficits / Details: L inattention, impaired coordination (limb apraxia), ability to grasp and release but needign verbal cueing to control tension of grasp. ROM more functional, bringing utensils and washcloths to mouth (better with hand  over hand assist) LUE Coordination: decreased fine motor;decreased gross motor            Vision   Additional Comments: Pt c/o blurry vision, sees objects up close better. Provided pt with glasses which he reports really improved vision/blurriness.   Perception     Praxis Praxis Praxis: Impaired Praxis Impairment Details: Limb  apraxia Praxis-Other Comments: pt aware of the task that he would like to perform, LUE goes out to reach for objects pt uncoordinated. Pt undershooting and impaired control of grasp and release.   Communication Communication Communication: No apparent difficulties   Cognition Arousal: Alert Behavior During Therapy: WFL for tasks assessed/performed Cognition: No apparent impairments                                        Cueing      Exercises Other Exercises Other Exercises: slow and controlled grasp and release of LUE while standing at sink, progressing to picking up washcloth    Shoulder Instructions       General Comments      Pertinent Vitals/ Pain       Pain Assessment Pain Assessment: Faces Faces Pain Scale: Hurts little more Pain Location: Knuckles with weight bearing assist from OT during ADLs Pain Descriptors / Indicators: Grimacing, Guarding, Discomfort, Aching Pain Intervention(s): Limited activity within patient's tolerance, Monitored during session, Repositioned  Home Living                                          Prior Functioning/Environment              Frequency  Min 1X/week        Progress Toward Goals  OT Goals(current goals can now be found in the care plan section)  Progress towards OT goals: Progressing toward goals  Acute Rehab OT Goals Patient Stated Goal: To get better OT Goal Formulation: With patient/family Time For Goal Achievement: 09/18/23 Potential to Achieve Goals: Good  Plan      Co-evaluation                 AM-PAC OT "6 Clicks" Daily Activity     Outcome Measure   Help from another person eating meals?: A Lot Help from another person taking care of personal grooming?: A Lot Help from another person toileting, which includes using toliet, bedpan, or urinal?: Total Help from another person bathing (including washing, rinsing, drying)?: Total Help from another person to put  on and taking off regular upper body clothing?: Total Help from another person to put on and taking off regular lower body clothing?: Total 6 Click Score: 8    End of Session Equipment Utilized During Treatment: Gait belt;Rolling walker (2 wheels)  OT Visit Diagnosis: Muscle weakness (generalized) (M62.81);Unsteadiness on feet (R26.81);Other abnormalities of gait and mobility (R26.89);Hemiplegia and hemiparesis;Cognitive communication deficit (R41.841) Symptoms and signs involving cognitive functions: Cerebral infarction Hemiplegia - Right/Left: Left Hemiplegia - caused by: Cerebral infarction (R parietal and small punctate)   Activity Tolerance Patient tolerated treatment well   Patient Left in chair;with call bell/phone within reach;with chair alarm set   Nurse Communication Mobility status        Time: 1216-1300 OT Time Calculation (min): 44 min  Charges: OT General Charges $OT Visit: 1 Visit OT Treatments $  Self Care/Home Management : 8-22 mins $Therapeutic Activity: 23-37 mins  09/06/2023  AB, OTR/L  Acute Rehabilitation Services  Office: [phone]   BAYNE FOSNAUGH 09/06/2023, 1:59 PM

## 2023-09-06 NOTE — Progress Notes (Signed)
Speech Language Pathology Treatment: Cognitive-Linquistic  Patient Details Name: Craig Schmidt MRN: [id number] DOB: 1971/02/13 Today's Date: 09/06/2023 Time: 4098-1191 SLP Time Calculation (min) (ACUTE ONLY): 19 min  Assessment / Plan / Recommendation Clinical Impression  Pt seen for neurogenic dysfluency and left neglect/inattention. His stuttering in conversation appears less today than on evaluation. SLP taught pt to stop use melodic intonation when he starts to become dysfluent. Melodic intonation uses variations in his pitch to facilitate fluency. He was able to repeat sentences varying his pitch with 100% accuracy. In conversation with SLP he used melodic intonation to recall activities of his day with an occasional cue. We discussed how it may sound a little awkward and therapist can state he is using the strategy to increase fluency or "help my speech" as he was little concerned if someone asked "why he was talking like that." Pt was able to visualize items on the left side of his room with 100% accuracy. Pt had difficulty reading a paragraph due to visual acuity. SLP had him cover his right eye then left eye and he had more success when covering his right eye and was able to read approximately 3 words. He needed cue to see words on left side of the page. Due to decreased manual dexterity it is sometimes difficult to use hand to cover right eye. ST to continue but pt making good progress towards goals.    HPI HPI: This is a 53 year old male with past medical history of ICM with prior hx CAD, biventricular failure LVEF < 20%, moderate mitral stenosis, ongoing substance use with crack, current cigarette smoker, 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  Patiently recently admitted 12/18-12/23 with acute systolic heart failure; previous admission 12/4-12/12 with multifocal pneumonia with necrotizing component, s/p BAL (12/11) with negative bacterial  culture and AFB/fungal cultures. Admitted with visual deficits, weakness, incoherent speech. CT negative, pt left AMA however returned and MRI 3 cm region of acute cortical and subcortical infarction on the right affecting the deep insula and parietal lobe consistent with embolic infarction in the right MCA territory. 2 or 3 punctate acute infarctions in the right medial occipital cortex. Few clustered punctate acute infarctions in the left frontal vertex region. Probable punctate acute infarction in the inferior left cerebellum; old bilateral frontal and parietal cortical and subcortical infarctions. Brief SLE performed 05/2023 due to pain level and pt lability; partial assessment showed dysarthria and cognitive impairments in attention. Recommendation for further diagnostic evaluation in treatment.      SLP Plan  Continue with current plan of care      Recommendations for follow up therapy are one component of a multi-disciplinary discharge planning process, led by the attending physician.  Recommendations may be updated based on patient status, additional functional criteria and insurance authorization.    Recommendations                         Intermittent Supervision/Assistance Other (comment) (speech fluency and left neglect/inattention)     Continue with current plan of care     Royce Macadamia  09/06/2023, 10:34 AM

## 2023-09-06 NOTE — Plan of Care (Signed)
Problem: Education: Goal: Knowledge of disease or condition will improve Outcome: Progressing   Problem: Ischemic Stroke/TIA Tissue Perfusion: Goal: Complications of ischemic stroke/TIA will be minimized Outcome: Progressing   Problem: Coping: Goal: Will verbalize positive feelings about self Outcome: Progressing Goal: Will identify appropriate support needs Outcome: Progressing   Problem: Health Behavior/Discharge Planning: Goal: Ability to manage health-related needs will improve Outcome: Progressing Goal: Goals will be collaboratively established with patient/family Outcome: Progressing   Problem: Self-Care: Goal: Ability to participate in self-care as condition permits will improve Outcome: Progressing   Problem: Nutrition: Goal: Risk of aspiration will decrease Outcome: Progressing

## 2023-09-06 NOTE — Plan of Care (Signed)
Complaint of shortness of breath, anxious, can't sleep, hooked to O2 via [place] at 1-2 lpm, informed Dr. Toniann Fail. Given ordered Prn Meds for sleeping.  Problem: Education: Goal: Knowledge of disease or condition will improve Outcome: Progressing   Problem: Ischemic Stroke/TIA Tissue Perfusion: Goal: Complications of ischemic stroke/TIA will be minimized Outcome: Progressing   Problem: Activity: Goal: Risk for activity intolerance will decrease Outcome: Progressing   Problem: Safety: Goal: Ability to remain free from injury will improve Outcome: Progressing

## 2023-09-06 NOTE — TOC Progression Note (Signed)
Transition of Care [facility]) - Progression Note    Patient Details  Name: Craig Schmidt MRN: [id number] Date of Birth: [birth year]  Transition of Care [facility]) CM/SW Contact  Baldemar Lenis, [place] Phone Number: 09/06/2023, 10:42 AM  Clinical Narrative:   Patient with no bed offers for SNF. CSW to follow.    Expected Discharge Plan: Skilled Nursing Facility Barriers to Discharge: Continued Medical Work up, English as a second language teacher, Inadequate or no insurance, Active Substance Use - Placement  Expected Discharge Plan and Services       Living arrangements for the past 2 months: Apartment                                       Social Determinants of Health (SDOH) Interventions SDOH Screenings   Food Insecurity: Food Insecurity Present (09/04/2023)  Housing: High Risk (09/04/2023)  Transportation Needs: Unmet Transportation Needs (09/04/2023)  Utilities: Not At Risk (09/04/2023)  Recent Concern: Utilities - At Risk (08/12/2023)  Alcohol Screen: High Risk (09/19/2021)  Depression (PHQ2-9): High Risk (06/13/2022)  Financial Resource Strain: High Risk (07/19/2023)  Stress: Stress Concern Present (10/16/2022)  Tobacco Use: High Risk (09/04/2023)    Readmission Risk Interventions    07/12/2023    1:06 PM 06/28/2023    1:27 PM 06/21/2023   12:50 PM  Readmission Risk Prevention Plan  Transportation Screening Complete Complete Complete  PCP or Specialist Appt within 3-5 Days  Complete   HRI or Home Care Consult  Complete   Social Work Consult for Recovery Care Planning/Counseling  Complete   Palliative Care Screening  Not Applicable   Medication Review Oceanographer) Complete Complete Complete  PCP or Specialist appointment within 3-5 days of discharge Complete  Complete  HRI or Home Care Consult Complete  Complete  SW Recovery Care/Counseling Consult Complete  Complete  Palliative Care Screening Not Applicable  Not Applicable  Skilled Nursing Facility Not Applicable   Not Applicable

## 2023-09-06 NOTE — Progress Notes (Signed)
Progress Note   Patient: Craig Schmidt:[id number] DOB: [birth year] DOA: 09/03/2023     2 DOS: the patient was seen and examined on 09/06/2023   Brief hospital course: The patient is a 53 yr old man who presented to [facility] ED on 09/03/2023 with complaints of loss of vision, weakness, and inability to stand. He had been admitted to [facility] 07/09/2024 for acute systolic heart failure, and 06/26/11/12/ for multifocal pneumonia with necrotizing component s/p BAL with negative bacterial culture and AFB/fungal cultures.    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   CT head was negative, and the patient left AMA. He was unable to move his left arm at this time, and this was pointed out to the patient. Ultimately he did come back to have the MRI which confirmed a stroke. Neurology was contacted. The patient was given an aspirin and admitted to a telemetry bed. The patient's eliquis was held. CXR demonstrated left retrocardiac opacity. QTc was 519.   MRI demonstrated a large 3 cm right parietal and insular embolic infarct along with 3 small punctate infarcts in the right medial occipital lobes and left frontal vertex and left inferior cerebellum. CTA of the head/neck demonstrated a right MCA branch occlusion corresponding to the infarct.   The patient continues to have significant left arm weakness and dysarthria. He meets criteria for CIR, but must have insurance approval.  Today the patient is complaining of shortness of breath and feels that he may have choked while eating today.   Assessment and Plan: Alcohol abuse Pt is being monitored for withdrawal. He has been advised that avoidance of alcohol is critical to his avoiding a catastrophic CVA in the future.  Acute CVA (cerebrovascular  accident) [facility]) The patient has suffered a right parietal infact with a occlusion of the right MCA branch.   Dysphagia The patient is complaining of feeling that things are getting caught as he swallows. I will consult SLP top re-evaluate.   Dyspnea The patient states that he feels short of breath. Will order SI to encourage deep respiration.  Cocaine abuse (HCC) Pt has been advised that he must stop cocaine use to avoid catastrophic CVA in the future.  Essential hypertension Diastolic pressures are in the 784'O. Will add hydrochlorothiazide.  HFrEF (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) EF less than 20%.  Hyperlipidemia Continue lipitor 40 mg daily.  Hypertension, malignant Improved.  Malnutrition of moderate degree Nutrition consulted.     Subjective: The patient is resting comfortably.   Physical Exam: Vitals:   09/06/23 0814 09/06/23 1302 09/06/23 1306 09/06/23 1547  BP: (!) 142/103 (!) 151/119 (!) 145/110 (!) 142/112  Pulse:  95  90  Resp:  (!) 22  17  Temp:  97.6 F (36.4 C)  97.6 F (36.4 C)  TempSrc:  Oral  Axillary  SpO2:  100%  100%  Weight:      Height:       Exam:  Constitutional:  The patient is awake, alert, and oriented x 3. No acute distress. Respiratory:  No increased work of breathing. No wheezes, rales, or rhonchi No tactile fremitus Cardiovascular:  Regular rate and rhythm No murmurs, ectopy, or gallups. No lateral PMI. No thrills. Abdomen:  Abdomen is soft, non-tender, non-distended No hernias, masses, or organomegaly Normoactive bowel  sounds.  Musculoskeletal:  No cyanosis, clubbing, or edema Skin:  No rashes, lesions, ulcers palpation of skin: no induration or nodules Neurologic:  CN 2-12 intact Sensation all 4 extremities intact Psychiatric:  Mental status Mood, affect appropriate Orientation to person, place, time  judgment and insight appear intact  Data Reviewed:  CBC BMP  Family Communication:  Friend is at bedside.  Disposition: Status is: Inpatient Remains inpatient appropriate because:   Planned Discharge Destination: Rehab    Time spent: 32 minutes  Author: Mayreli Alden, DO 09/06/2023 6:06 PM  For on call review [email or web address].

## 2023-09-06 NOTE — Progress Notes (Signed)
Inpatient Rehab Admissions Coordinator:   Reviewed with Dr. Riley Kill and Dr. Shearon Stalls.  Rehab MD to see today and I will f/u.   Estill Dooms, PT, DPT Admissions Coordinator [phone] 09/06/23  9:17 AM

## 2023-09-06 NOTE — Consult Note (Signed)
Physical Medicine and Rehabilitation Consult Reason for Consult:right sided weakness and impaired functional mobility Referring Physician: Gerri Lins   HPI: Craig Schmidt is a 53 y.o. male with a hx of ICM, CAD, PSA, CKD II,prior CVA, non-compliance who presented on 09/03/23 with c/o impaired vision, right sided weakness. He was recently admitted in December for asCHF and pneumonia. Initial CTH was negative and the patient left AMA. He did return as he had ongoing weakness in his left arm. MRI at that time revealed embolic infarcts including a large 3cm right-sided infarct in the parietal and insular regions as well as 3 small punctate infarcts in the right medial occipital loves, left frontal vertex, and left inferior cerebellum. CTA of the head and neck demonstrated a right MCA branch occlusion.  Blood pressure remains difficult to control. EF <20%. Pt was up with therapy on Wednesday and was mod assist for transfers but did not attempt gait. He was total assist for ADL's.    Review of Systems  Constitutional:  Positive for diaphoresis and malaise/fatigue.  HENT:  Positive for sore throat.   Eyes:  Positive for blurred vision.  Respiratory:  Positive for shortness of breath.   Cardiovascular:  Positive for palpitations.  Gastrointestinal: Negative.   Genitourinary: Negative.   Musculoskeletal:  Positive for joint pain and myalgias.  Skin:  Negative for rash.  Neurological:  Positive for sensory change, focal weakness and weakness.  Psychiatric/Behavioral:  Positive for substance abuse.    Past Medical History:  Diagnosis Date   Alcohol abuse    Asthma    Brain tumor (HCC)    CAD (coronary artery disease)    Chronic HFrEF (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)    Chronic kidney disease, stage 3 (HCC)    Cocaine abuse (HCC)    History of medication noncompliance    Homelessness    MI (myocardial infarction) (HCC)    STEMI (ST elevation myocardial infarction)  (HCC)    Stroke [facility])    Past Surgical History:  Procedure Laterality Date   BRONCHIAL NEEDLE ASPIRATION BIOPSY  07/03/2023   Procedure: BRONCHIAL NEEDLE ASPIRATION BIOPSIES;  Surgeon: Lorin Glass, MD;  Location: [facility] ENDOSCOPY;  Service: Pulmonary;;   BRONCHIAL WASHINGS  07/03/2023   Procedure: BRONCHIAL WASHINGS;  Surgeon: Lorin Glass, MD;  Location: [facility] ENDOSCOPY;  Service: Pulmonary;;   BUBBLE STUDY  09/11/2021   Procedure: BUBBLE STUDY;  Surgeon: Pricilla Riffle, MD;  Location: [facility] ENDOSCOPY;  Service: Cardiovascular;;   LEFT HEART CATH AND CORONARY ANGIOGRAPHY N/A 12/19/2021   Procedure: LEFT HEART CATH AND CORONARY ANGIOGRAPHY;  Surgeon: Lyn Records, MD;  Location: [facility] INVASIVE CV LAB;  Service: Cardiovascular;  Laterality: N/A;   RIGHT/LEFT HEART CATH AND CORONARY ANGIOGRAPHY N/A 04/13/2022   Procedure: RIGHT/LEFT HEART CATH AND CORONARY ANGIOGRAPHY;  Surgeon: Laurey Morale, MD;  Location[facility] INVASIVE CV LAB;  Service: Cardiovascular;  Laterality: N/A;   TEE WITHOUT CARDIOVERSION N/A 09/11/2021   Procedure: TRANSESOPHAGEAL ECHOCARDIOGRAM (TEE);  Surgeon: Pricilla Riffle, MD;  Location: [facility] ENDOSCOPY;  Service: Cardiovascular;  Laterality: N/A;   VIDEO BRONCHOSCOPY WITH ENDOBRONCHIAL ULTRASOUND N/A 07/03/2023   Procedure: VIDEO BRONCHOSCOPY WITH ENDOBRONCHIAL ULTRASOUND;  Surgeon: Lorin Glass, MD;  Location: [facility] ENDOSCOPY;  Service: Pulmonary;  Laterality: N/A;   Family History  Problem Relation Age of Onset   Diabetes Mother    Social History:  reports that he has been smoking cigarettes. He has a 41 pack-year smoking history.  He has been exposed to tobacco smoke. He has never used smokeless tobacco. He reports current alcohol use of about 63.0 standard drinks of alcohol per week. He reports current drug use. Drugs: "Crack" cocaine and Cocaine. Allergies:  Allergies  Allergen Reactions   Shellfish Allergy Anaphylaxis   Peanut (Diagnostic) Itching   Medications Prior to  Admission  Medication Sig Dispense Refill   apixaban (ELIQUIS) 5 MG TABS tablet Take 1 tablet (5 mg total) by mouth 2 (two) times daily. 60 tablet 3   aspirin EC 81 MG tablet Take 1 tablet (81 mg total) by mouth daily. Swallow whole. 30 tablet 12   atorvastatin (LIPITOR) 40 MG tablet Take 1 tablet (40 mg total) by mouth daily. 30 tablet 6   carvedilol (COREG) 3.125 MG tablet Take 1 tablet (3.125 mg total) by mouth 2 (two) times daily. 60 tablet 6   spironolactone (ALDACTONE) 25 MG tablet Take 0.5 tablets (12.5 mg total) by mouth daily. 15 tablet 2   empagliflozin (JARDIANCE) 10 MG TABS tablet Take 1 tablet (10 mg total) by mouth daily. (Patient not taking: Reported on 09/04/2023) 30 tablet 2   furosemide (LASIX) 40 MG tablet Take 1 tablet (40 mg total) by mouth daily. (Patient not taking: Reported on 09/04/2023) 30 tablet 0   nicotine polacrilex (NICORETTE) 2 MG gum Take 1 each (2 mg total) by mouth as needed for smoking cessation. (Patient not taking: Reported on 09/04/2023) 110 tablet 0   oxyCODONE-acetaminophen (PERCOCET) 5-325 MG tablet Take 1 tablet by mouth every 6 (six) hours as needed. (Patient not taking: Reported on 08/12/2023) 15 tablet 0   sacubitril-valsartan (ENTRESTO) 24-26 MG Take 1 tablet by mouth 2 (two) times daily. (Patient not taking: Reported on 09/04/2023) 60 tablet 0   triamcinolone (KENALOG) 0.025 % cream Apply 1 Application topically 2 (two) times daily. (Patient not taking: Reported on 09/04/2023) 60 g 1    Home: Home Living Family/patient expects to be discharged to:: Private residence Living Arrangements: Non-relatives/Friends Available Help at Discharge: Family, Available 24 hours/day Type of Home: Apartment (lives in low income housing/apartment) Home Access: Level entry Home Layout: One level Bathroom Shower/Tub: Engineer, manufacturing systems: Standard Home Equipment: Occupational hygienist (4 wheels), [employer] - single point  Lives With: Friend(s) (lives with [name]- not sure if  this is significant other or friend)  Functional History: Prior Function Prior Level of Function : Needs assist Mobility Comments: uses cane and Rollator for ambulation, reports his old Rollator is broken and is requesting new one ADLs Comments: male friend [name] reports she continues to help him with ADLS Functional Status:  Mobility: Bed Mobility Overal bed mobility: Needs Assistance Bed Mobility: Supine to Sit, Sit to Supine Supine to sit: Min assist Sit to supine: Supervision, Min assist General bed mobility comments: Min A assist with repositioning BLEs to EOB, pt able to propel trunk into upright sitting position. Transfers Overall transfer level: Needs assistance Equipment used: 2 person hand held assist Transfers: Sit to/from Stand Sit to Stand: Min assist, +2 safety/equipment General transfer comment: Cues for erect standing posture and neutral head positioning. Able to ambualte in hallway with small shuffle steps and Mod A +2 HHA for safety/balance Ambulation/Gait General Gait Details: unable today    ADL: ADL Eating/Feeding: Total assistance, Sitting Grooming: Total assistance, Sitting Upper Body Bathing: Total assistance, Sitting Lower Body Bathing: Total assistance, Sitting/lateral leans Upper Body Dressing : Total assistance, Sitting Lower Body Dressing: Total assistance, Sitting/lateral leans Toilet Transfer: +2 for safety/equipment, Moderate assistance  Functional mobility during ADLs: +2 for physical assistance, +2 for safety/equipment, Moderate assistance General ADL Comments: Pt overall total A at this time for ADLs, also needing assist with feeding due to bilat UE deficits.  Cognition: Cognition Overall Cognitive Status: History of cognitive impairments - at baseline (was overall functional for items assessed) Arousal/Alertness: Awake/alert Orientation Level: Oriented X4 Attention: Sustained Sustained Attention: Appears intact Memory: Appears intact  (recalled 4/4 words) Awareness: Appears intact Problem Solving: Appears intact Safety/Judgment: Appears intact Cognition Arousal: Lethargic Behavior During Therapy: Flat affect Overall Cognitive Status: History of cognitive impairments - at baseline (was overall functional for items assessed)  Blood pressure (!) 142/103, pulse 88, temperature 97.6 F (36.4 C), temperature source Oral, resp. rate (!) 22, height 5\' 7"  (1.702 m), weight 68 kg, SpO2 100%. Physical Exam Constitutional:      General: He is not in acute distress.    Appearance: He is not ill-appearing.  HENT:     Head: Normocephalic.     Right Ear: External ear normal.     Left Ear: External ear normal.     Nose: Nose normal.  Eyes:     Conjunctiva/sclera: Conjunctivae normal.     Pupils: Pupils are equal, round, and reactive to light.  Cardiovascular:     Rate and Rhythm: Normal rate.  Pulmonary:     Effort: Pulmonary effort is normal.  Abdominal:     Palpations: Abdomen is soft.  Musculoskeletal:        General: Tenderness (RUE and LUE tender to palpation) present.     Cervical back: Normal range of motion.  Skin:    Coloration: Skin is not jaundiced.     Findings: Lesion present.  Neurological:     Comments: Pt alert, oriented to person, place, month/year, situation to an extent. Seems to attend to the left. Able to track. Attends to conversation. Language appears intact. Mild left central 7. RUE flexor contractures/underlying tone MAS 3-4/4. LUE also with early flexor tone. MMT: RUE 0/5 (effort?). LUE 2+ to 3-/5 prox to distal. RLE 3-4/5 with inconsistent effort. LLE similar at 3-4/5. DTR's 2-3+ in all 4's. Toes up. No focal cerebellar signs.   Psychiatric:     Comments: Pt flat but cooperative     Results for orders placed or performed during the hospital encounter of 09/03/23 (from the past 24 hours)  CBC with Differential/Platelet     Status: Abnormal   Collection Time: 09/05/23  1:44 PM  Result Value Ref  Range   WBC 7.2 4.0 - 10.5 K/uL   RBC 3.72 (L) 4.22 - 5.81 MIL/uL   Hemoglobin 10.5 (L) 13.0 - 17.0 g/dL   HCT 16.1 (L) 09.6 - 04.5 %   MCV 90.1 80.0 - 100.0 fL   MCH 28.2 26.0 - 34.0 pg   MCHC 31.3 30.0 - 36.0 g/dL   RDW 40.9 (H) 81.1 - 91.4 %   Platelets 208 150 - 400 K/uL   nRBC 0.0 0.0 - 0.2 %   Neutrophils Relative % 67 %   Neutro Abs 4.8 1.7 - 7.7 K/uL   Lymphocytes Relative 21 %   Lymphs Abs 1.5 0.7 - 4.0 K/uL   Monocytes Relative 6 %   Monocytes Absolute 0.5 0.1 - 1.0 K/uL   Eosinophils Relative 5 %   Eosinophils Absolute 0.3 0.0 - 0.5 K/uL   Basophils Relative 1 %   Basophils Absolute 0.0 0.0 - 0.1 K/uL   Immature Granulocytes 0 %   Abs Immature Granulocytes 0.02  0.00 - 0.07 K/uL  Basic metabolic panel     Status: Abnormal   Collection Time: 09/05/23  1:44 PM  Result Value Ref Range   Sodium 139 135 - 145 mmol/L   Potassium 4.4 3.5 - 5.1 mmol/L   Chloride 110 98 - 111 mmol/L   CO2 20 (L) 22 - 32 mmol/L   Glucose, Bld 111 (H) 70 - 99 mg/dL   BUN 23 (H) 6 - 20 mg/dL   Creatinine, Ser 9.62 (H) 0.61 - 1.24 mg/dL   Calcium 8.8 (L) 8.9 - 10.3 mg/dL   GFR, Estimated 50 (L) >60 mL/min   Anion gap 9 5 - 15   No results found.  Assessment/Plan: Diagnosis: 53 yo male with polysubstance abuse s/p embolic cerebral infarcts with right MCA branch being the greatest area of involvement.  Does the need for close, 24 hr/day medical supervision in concert with the patient's rehab needs make it unreasonable for this patient to be served in a less intensive setting? Potentially Co-Morbidities requiring supervision/potential complications:  -prior CVA with spastic RUE weakness/contractures -Uncontrolled HTN -polysubstance abuse -nutrition -post-stroke sequelae Due to bladder management, bowel management, safety, skin/wound care, disease management, medication administration, pain management, and patient education, does the patient require 24 hr/day rehab nursing? Yes Does the  patient require coordinated care of a physician, rehab nurse, therapy disciplines of PT, OT, and SLP to address physical and functional deficits in the context of the above medical diagnosis(es)? Yes Addressing deficits in the following areas: balance, endurance, locomotion, strength, transferring, bowel/bladder control, bathing, dressing, feeding, grooming, toileting, cognition, and psychosocial support Can the patient actively participate in an intensive therapy program of at least 3 hrs of therapy per day at least 5 days per week? Yes and Potentially The potential for patient to make measurable gains while on inpatient rehab is good Anticipated functional outcomes upon discharge from inpatient rehab are supervision  with PT, supervision and min assist with OT, modified independent and supervision with SLP. Estimated rehab length of stay to reach the above functional goals is: ?14-18 days Anticipated discharge destination: Home Overall Rehab/Functional Prognosis: good  POST ACUTE RECOMMENDATIONS: This patient's condition is appropriate for continued rehabilitative care in the following setting: CIR Patient has agreed to participate in recommended program. Potentially Note that insurance prior authorization may be required for reimbursement for recommended care.  Comment: Had an extended discussion with pt and his significant other about inpatient rehab, expected goals, LOS, home set up, care giver willingness to assist. I need to see him participate with therapies, and we need to learn a little more about girlfriend, dispo. Rehab Admissions Coordinator to follow up.     MEDICAL RECOMMENDATIONS: Had lengthy discussion regarding his substance abuse and the potential life-threatening effects continued substance abuse will carry.    I have personally performed a face to face diagnostic evaluation of this patient. Additionally, I have examined the patient's medical record including any pertinent  labs and radiographic images.    Thanks,  Ranelle Oyster, MD 09/06/2023

## 2023-09-06 NOTE — Progress Notes (Signed)
Physical Therapy Treatment Patient Details Name: Craig Schmidt MRN: [id number] DOB: 03-01-1971 Today's Date: 09/06/2023   History of Present Illness 53 y.o. male presenting to ED 2/12 with L sided weakness and confusion. MRI found a right parietal infarct as well as other small punctate infarcts. 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    PT Comments  Pt laying almost flat on his back on entry with complaints of SoB. SpO2 100%O2 and pt reporting breathing was fine when he was sitting up in the chair earlier. Provided min A for pt to come to EoB, where breathing improved. Pt requiring increased cuing and effort to maintain upright posture in sitting. Pt requires modx2 for standing and ambulating in room with RW. Min A for patient to return to bed. Provide pt and SO education on keeping upright posture in bed to decrease work of breathing. D/c plan remains appropriate. PT will continue to follow acutely.     If plan is discharge home, recommend the following: A lot of help with walking and/or transfers;A lot of help with bathing/dressing/bathroom;Assistance with cooking/housework;Direct supervision/assist for medications management;Direct supervision/assist for financial management;Assist for transportation;Help with stairs or ramp for entrance;Supervision due to cognitive status   Can travel by private vehicle      Yes  Equipment Recommendations  Rollator (4 wheels);BSC/3in1       Precautions / Restrictions Precautions Precautions: Fall Precaution/Restrictions Comments: reports ocassional falling Restrictions Weight Bearing Restrictions Per Provider Order: No     Mobility  Bed Mobility Overal bed mobility: Needs Assistance Bed Mobility: Supine to Sit, Sit to Supine     Supine to sit: Min assist Sit to supine: Min assist   General bed mobility comments:  pt able to move LE off bed but requires min A for bringning trunk to upright, needs min A for bringing LE back into bed    Transfers Overall transfer level: Needs assistance Equipment used: Rolling walker (2 wheels) Transfers: Sit to/from Stand Sit to Stand: Mod assist           General transfer comment: modA for power up to standing, pt unable to grasp RW with R or L hand without therapist assistance over top, unable to use contralateral UE to push up to standing, with mod A for power up and steady pt could maintain standing    Ambulation/Gait Ambulation/Gait assistance: Mod assist, Max assist, +2 physical assistance Gait Distance (Feet): 18 Feet Assistive device: Rolling walker (2 wheels) Gait Pattern/deviations: Step-through pattern, Shuffle, Trunk flexed Gait velocity: slowed Gait velocity interpretation: <1.31 ft/sec, indicative of household ambulator   General Gait Details: pt requiring maxA for maintaining hands on RW, multimodal cuing for sequencing steps and upright posture         Balance Overall balance assessment: Needs assistance Sitting-balance support: Feet supported, No upper extremity supported Sitting balance-Leahy Scale: Fair Sitting balance - Comments: requires constant cuing for upright posture, pt able to achieve but not maintain   Standing balance support: Bilateral upper extremity supported, During functional activity Standing balance-Leahy Scale: Zero Standing balance comment: reliant on ext support                            Communication Communication Communication: Impaired Factors Affecting Communication: Reduced clarity of speech;Difficulty expressing self  Cognition Arousal: Alert Behavior During Therapy: Flat affect, Anxious  PT - Cognitive impairments: Sequencing, Problem solving, Awareness, Initiation, Attention, Safety/Judgement                       PT - Cognition Comments: pt requires increased time and  processing to sequence movement Following commands: Impaired Following commands impaired: Follows one step commands with increased time, Follows multi-step commands with increased time    Cueing Cueing Techniques: Verbal cues, Gestural cues, Tactile cues, Visual cues     General Comments General comments (skin integrity, edema, etc.): pt supine in bed with complaints of SoB on entry, SpO2 noted to be 100% pt reports similar instance last night and that RN placed 2L O2 and he felt much better, encouraged pt not to use supplemental O2 if he maintains high SpO2 but instead sit upright in bed for improved mechanics of breathing, possible phrenic nerve involvement causing increased difficulty      Pertinent Vitals/Pain Pain Assessment Pain Assessment: Faces Faces Pain Scale: Hurts a little bit Pain Location: generalized with movement Pain Descriptors / Indicators: Grimacing, Guarding Pain Intervention(s): Limited activity within patient's tolerance, Monitored during session, Repositioned     PT Goals (current goals can now be found in the care plan section) Acute Rehab PT Goals PT Goal Formulation: With patient/family Time For Goal Achievement: 09/18/23 Potential to Achieve Goals: Fair Progress towards PT goals: Progressing toward goals    Frequency    Min 1X/week       AM-PAC PT "6 Clicks" Mobility   Outcome Measure  Help needed turning from your back to your side while in a flat bed without using bedrails?: None Help needed moving from lying on your back to sitting on the side of a flat bed without using bedrails?: A Little Help needed moving to and from a bed to a chair (including a wheelchair)?: A Lot Help needed standing up from a chair using your arms (e.g., wheelchair or bedside chair)?: A Lot Help needed to walk in hospital room?: Total Help needed climbing 3-5 steps with a railing? : Total 6 Click Score: 13    End of Session Equipment Utilized During Treatment: Gait  belt Activity Tolerance: Patient tolerated treatment well Patient left: in bed;with call bell/phone within reach;with bed alarm set;Other (comment);with family/visitor present (pt placed in very upright position with pillow support and noted to have better breathing mechanics and no SoB) Nurse Communication: Mobility status PT Visit Diagnosis: Unsteadiness on feet (R26.81);Other abnormalities of gait and mobility (R26.89);History of falling (Z91.81);Muscle weakness (generalized) (M62.81);Difficulty in walking, not elsewhere classified (R26.2);Other symptoms and signs involving the nervous system (R29.898);Hemiplegia and hemiparesis Hemiplegia - Right/Left: Left Hemiplegia - dominant/non-dominant: Non-dominant Hemiplegia - caused by: Cerebral infarction     Time: 1410-1435 PT Time Calculation (min) (ACUTE ONLY): 25 min  Charges:    $Gait Training: 8-22 mins $Neuromuscular Re-education: 8-22 mins PT General Charges $$ ACUTE PT VISIT: 1 Visit                     Lakina Mcintire B. Beverely Risen PT, DPT Acute Rehabilitation Services Please use secure chat or  Call Office [phone]    Elon Alas [facility] 09/06/2023, 3:27 PM

## 2023-09-07 DIAGNOSIS — I639 Cerebral infarction, unspecified: Secondary | ICD-10-CM | POA: Diagnosis not present

## 2023-09-07 MED ORDER — MIRTAZAPINE 15 MG PO TABS
15.0000 mg | ORAL_TABLET | Freq: Every day | ORAL | Status: DC
  Administered 2023-09-07 – 2023-09-19 (×13): 15 mg via ORAL
  Filled 2023-09-07 (×13): qty 1

## 2023-09-07 NOTE — Plan of Care (Signed)
  Problem: Education: Goal: Knowledge of disease or condition will improve Outcome: Progressing   Problem: Ischemic Stroke/TIA Tissue Perfusion: Goal: Complications of ischemic stroke/TIA will be minimized Outcome: Progressing   Problem: Coping: Goal: Will identify appropriate support needs Outcome: Progressing   Problem: Health Behavior/Discharge Planning: Goal: Ability to manage health-related needs will improve Outcome: Progressing   Problem: Self-Care: Goal: Ability to participate in self-care as condition permits will improve Outcome: Progressing Goal: Ability to communicate needs accurately will improve Outcome: Progressing   Problem: Nutrition: Goal: Risk of aspiration will decrease Outcome: Progressing Goal: Dietary intake will improve Outcome: Progressing   Problem: Education: Goal: Knowledge of General Education information will improve Description: Including pain rating scale, medication(s)/side effects and non-pharmacologic comfort measures Outcome: Progressing   Problem: Clinical Measurements: Goal: Ability to maintain clinical measurements within normal limits will improve Outcome: Progressing Goal: Will remain free from infection Outcome: Progressing Goal: Diagnostic test results will improve Outcome: Progressing Goal: Respiratory complications will improve Outcome: Progressing Goal: Cardiovascular complication will be avoided Outcome: Progressing   Problem: Activity: Goal: Risk for activity intolerance will decrease Outcome: Progressing   Problem: Nutrition: Goal: Adequate nutrition will be maintained Outcome: Progressing

## 2023-09-07 NOTE — Progress Notes (Signed)
PHARMACY - ANTICOAGULATION CONSULT NOTE  Pharmacy Consult for Apixaban dosing  Indication: stroke  Allergies  Allergen Reactions   Shellfish Allergy Anaphylaxis   Peanut (Diagnostic) Itching    Patient Measurements: Height: 5\' 7"  (170.2 cm) Weight: 68 kg (150 lb) IBW/kg (Calculated) : 66.1  Vital Signs: Temp: 98 F (36.7 C) (02/15 0458) Temp Source: Oral (02/15 0458) BP: 137/104 (02/15 0458) Pulse Rate: 91 (02/15 0458)  Labs: Recent Labs    09/05/23 1344 09/06/23 0935  HGB 10.5* 10.5*  HCT 33.5* 33.3*  PLT 208 202  CREATININE 1.65* 1.37*    Estimated Creatinine Clearance: 59.6 mL/min (A) (by C-G formula based on SCr of 1.37 mg/dL (H)).   Medical History: Past Medical History:  Diagnosis Date   Alcohol abuse    Asthma    Brain tumor (HCC)    CAD (coronary artery disease)    Chronic HFrEF (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)    Chronic kidney disease, stage 3 (HCC)    Cocaine abuse (HCC)    History of medication noncompliance    Homelessness    MI (myocardial infarction) (HCC)    STEMI (ST elevation myocardial infarction) (HCC)    Stroke (HCC)     Medications:  Medications Prior to Admission  Medication Sig Dispense Refill Last Dose/Taking   apixaban (ELIQUIS) 5 MG TABS tablet Take 1 tablet (5 mg total) by mouth 2 (two) times daily. 60 tablet 3 09/03/2023 at  8:00 PM   aspirin EC 81 MG tablet Take 1 tablet (81 mg total) by mouth daily. Swallow whole. 30 tablet 12 09/03/2023 Morning   atorvastatin (LIPITOR) 40 MG tablet Take 1 tablet (40 mg total) by mouth daily. 30 tablet 6 09/04/2023 Morning   carvedilol (COREG) 3.125 MG tablet Take 1 tablet (3.125 mg total) by mouth 2 (two) times daily. 60 tablet 6 09/03/2023 Evening   spironolactone (ALDACTONE) 25 MG tablet Take 0.5 tablets (12.5 mg total) by mouth daily. 15 tablet 2 09/03/2023 Morning   empagliflozin (JARDIANCE) 10 MG TABS tablet Take 1 tablet (10 mg total) by mouth daily. (Patient not  taking: Reported on 09/04/2023) 30 tablet 2 Not Taking   furosemide (LASIX) 40 MG tablet Take 1 tablet (40 mg total) by mouth daily. (Patient not taking: Reported on 09/04/2023) 30 tablet 0 Not Taking   nicotine polacrilex (NICORETTE) 2 MG gum Take 1 each (2 mg total) by mouth as needed for smoking cessation. (Patient not taking: Reported on 09/04/2023) 110 tablet 0 Not Taking   oxyCODONE-acetaminophen (PERCOCET) 5-325 MG tablet Take 1 tablet by mouth every 6 (six) hours as needed. (Patient not taking: Reported on 08/12/2023) 15 tablet 0 Not Taking   sacubitril-valsartan (ENTRESTO) 24-26 MG Take 1 tablet by mouth 2 (two) times daily. (Patient not taking: Reported on 09/04/2023) 60 tablet 0 Not Taking   triamcinolone (KENALOG) 0.025 % cream Apply 1 Application topically 2 (two) times daily. (Patient not taking: Reported on 09/04/2023) 60 g 1 Not Taking   Scheduled:   apixaban  5 mg Oral BID   aspirin EC  81 mg Oral Daily   atorvastatin  40 mg Oral Daily   folic acid  1 mg Oral Daily   influenza vac split trivalent PF  0.5 mL Intramuscular Tomorrow-1000   LORazepam  0-4 mg Oral Q6H   Followed by   Melene Muller ON 09/08/2023] LORazepam  0-4 mg Oral Q12H   multivitamin with minerals  1 tablet Oral Daily   nicotine  14 mg Transdermal  Daily   pneumococcal 20-valent conjugate vaccine  0.5 mL Intramuscular Tomorrow-1000   thiamine  100 mg Oral Daily   Or   thiamine  100 mg Intravenous Daily    Assessment: Patient is a 36 yoM admitted for acute CVA with left sided weakness. Of note, patient has prior history of cocaine abuse and CVA on Apixaban. CTA head/neck demonstrated right MCA branch occlusion. Pharmacy was consulted to resume PTA Apixaban. CBC stable  Goal of Therapy:  Monitor platelets by anticoagulation protocol: Yes   Plan:  Continue Apixaban 5mg  BID  Pharmacy is signing off at this time   Dorothee Napierkowski 09/07/2023,8:10 AM

## 2023-09-07 NOTE — Plan of Care (Signed)
PRN meds given for BP, rechecked afterwards. Assisted using bedside commode. No other complaints noted.  Problem: Education: Goal: Knowledge of disease or condition will improve Outcome: Progressing   Problem: Ischemic Stroke/TIA Tissue Perfusion: Goal: Complications of ischemic stroke/TIA will be minimized Outcome: Progressing   Problem: Self-Care: Goal: Ability to participate in self-care as condition permits will improve Outcome: Progressing   Problem: Activity: Goal: Risk for activity intolerance will decrease Outcome: Progressing   Problem: Coping: Goal: Level of anxiety will decrease Outcome: Progressing   Problem: Safety: Goal: Ability to remain free from injury will improve Outcome: Progressing   Problem: Skin Integrity: Goal: Risk for impaired skin integrity will decrease Outcome: Progressing

## 2023-09-07 NOTE — Progress Notes (Signed)
Progress Note   Patient: Craig Schmidt UJW:[id number] DOB: [birth year] DOA: 09/03/2023     3 DOS: the patient was seen and examined on 09/07/2023   Brief hospital course: The patient is a 53 yr old man who presented to [facility] ED on 09/03/2023 with complaints of loss of vision, weakness, and inability to stand. He had been admitted to [facility] 07/09/2024 for acute systolic heart failure, and 06/26/11/12/ for multifocal pneumonia with necrotizing component s/p BAL with negative bacterial culture and AFB/fungal cultures.    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   CT head was negative, and the patient left AMA. He was unable to move his left arm at this time, and this was pointed out to the patient. Ultimately he did come back to have the MRI which confirmed a stroke. Neurology was contacted. The patient was given an aspirin and admitted to a telemetry bed. The patient's eliquis was held. CXR demonstrated left retrocardiac opacity. QTc was 519.   MRI demonstrated a large 3 cm right parietal and insular embolic infarct along with 3 small punctate infarcts in the right medial occipital lobes and left frontal vertex and left inferior cerebellum. CTA of the head/neck demonstrated a right MCA branch occlusion corresponding to the infarct.   The patient continues to have significant left arm weakness and dysarthria. He meets criteria for CIR, but must have insurance approval.  On 09/06/2023 the patient is complaining of shortness of breath and feels that he may have choked while eating. SLP was asked to re-evaluate the patient. No changes were made to his diet recommendations.   Assessment and Plan: Alcohol abuse Pt is being monitored for withdrawal. He has been advised that avoidance of alcohol  is critical to his avoiding a catastrophic CVA in the future. CIWA protocol is in place.  Acute CVA (cerebrovascular accident) [facility]) The patient has suffered a right parietal infact with a occlusion of the right MCA branch.   Dysphagia The patient is complaining of feeling that things are getting caught as he swallows. SLP was asked to re-evaluate the patient. No changes were made to his diet recommendations.  Dyspnea The patient states that he feels short of breath. Will order SI to encourage deep respiration.  Cocaine abuse (HCC) Pt has been advised that he must stop cocaine use to avoid catastrophic CVA in the future.  Essential hypertension Diastolic pressures are in the 562'Z. Will add hydrochlorothiazide.  HFrEF (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) EF less than 20%.  Hyperlipidemia Continue lipitor 40 mg daily.  Hypertension, malignant Improved.  Malnutrition of moderate degree Nutrition consulted.     Subjective: The patient is resting comfortably. No new complaints.  Physical Exam: Vitals:   09/07/23 0450 09/07/23 0458 09/07/23 1112 09/07/23 1645  BP: (!) 122/95 (!) 137/104 (!) 148/108 (!) 126/100  Pulse: 99 91 92 99  Resp:  18 16   Temp:  98 F (36.7 C) 98.6 F (37 C)   TempSrc:  Oral Oral   SpO2:   95%   Weight:      Height:       Exam:  Constitutional:  The patient is awake, alert, and oriented x 3. No acute distress. Respiratory:  No increased work of breathing. No wheezes, rales, or rhonchi No tactile fremitus Cardiovascular:  Regular rate and rhythm No murmurs, ectopy, or gallups. No lateral PMI. No thrills. Abdomen:  Abdomen is soft, non-tender, non-distended No hernias, masses, or organomegaly Normoactive bowel sounds.  Musculoskeletal:  No cyanosis, clubbing, or edema Skin:  No rashes, lesions, ulcers palpation of skin: no induration or nodules Neurologic:  CN 2-12 intact Sensation all 4 extremities  intact Psychiatric:  Mental status Mood, affect appropriate Orientation to person, place, time  judgment and insight appear intact  Data Reviewed:  CBC BMP  Family Communication: Friend is at bedside.  Disposition: Status is: Inpatient Remains inpatient appropriate because:   Planned Discharge Destination: Rehab    Time spent: 32 minutes  Author: Elira Colasanti, DO 09/07/2023 5:46 PM  For on call review [email or web address].

## 2023-09-07 NOTE — Evaluation (Signed)
Clinical/Bedside Swallow Evaluation Patient Details  Name: HERCULES HASLER MRN: [id number] Date of Birth: January 16, 1971  Today's Date: 09/07/2023 Time: SLP Start Time (ACUTE ONLY): 1505 SLP Stop Time (ACUTE ONLY): 1516 SLP Time Calculation (min) (ACUTE ONLY): 11 min  Past Medical History:  Past Medical History:  Diagnosis Date   Alcohol abuse    Asthma    Brain tumor (HCC)    CAD (coronary artery disease)    Chronic HFrEF (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)    Chronic kidney disease, stage 3 (HCC)    Cocaine abuse (HCC)    History of medication noncompliance    Homelessness    MI (myocardial infarction) (HCC)    STEMI (ST elevation myocardial infarction) (HCC)    Stroke [facility])    Past Surgical History:  Past Surgical History:  Procedure Laterality Date   BRONCHIAL NEEDLE ASPIRATION BIOPSY  07/03/2023   Procedure: BRONCHIAL NEEDLE ASPIRATION BIOPSIES;  Surgeon: Lorin Glass, MD;  Location: [facility] ENDOSCOPY;  Service: Pulmonary;;   BRONCHIAL WASHINGS  07/03/2023   Procedure: BRONCHIAL WASHINGS;  Surgeon: Lorin Glass, MD;  Location: [facility] ENDOSCOPY;  Service: Pulmonary;;   BUBBLE STUDY  09/11/2021   Procedure: BUBBLE STUDY;  Surgeon: Pricilla Riffle, MD;  Location: [facility] ENDOSCOPY;  Service: Cardiovascular;;   LEFT HEART CATH AND CORONARY ANGIOGRAPHY N/A 12/19/2021   Procedure: LEFT HEART CATH AND CORONARY ANGIOGRAPHY;  Surgeon: Lyn Records, MD;  Location: [facility] INVASIVE CV LAB;  Service: Cardiovascular;  Laterality: N/A;   RIGHT/LEFT HEART CATH AND CORONARY ANGIOGRAPHY N/A 04/13/2022   Procedure: RIGHT/LEFT HEART CATH AND CORONARY ANGIOGRAPHY;  Surgeon: Laurey Morale, MD;  Location: [facility] INVASIVE CV LAB;  Service: Cardiovascular;  Laterality: N/A;   TEE WITHOUT CARDIOVERSION N/A 09/11/2021   Procedure: TRANSESOPHAGEAL ECHOCARDIOGRAM (TEE);  Surgeon: Pricilla Riffle, MD;  Location: [facility] ENDOSCOPY;  Service: Cardiovascular;  Laterality: N/A;   VIDEO BRONCHOSCOPY WITH  ENDOBRONCHIAL ULTRASOUND N/A 07/03/2023   Procedure: VIDEO BRONCHOSCOPY WITH ENDOBRONCHIAL ULTRASOUND;  Surgeon: Lorin Glass, MD;  Location: [facility] ENDOSCOPY;  Service: Pulmonary;  Laterality: N/A;   HPI:  This is a 53 year old male with past medical history of ICM with prior hx CAD, biventricular failure LVEF < 20%, moderate mitral stenosis, ongoing substance use with crack, current cigarette smoker, 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  Patiently recently admitted 12/18-12/23 with acute systolic heart failure; previous admission 12/4-12/12 with multifocal pneumonia with necrotizing component, s/p BAL (12/11) with negative bacterial culture and AFB/fungal cultures. Admitted with visual deficits, weakness, incoherent speech. CT negative, pt left AMA however returned and MRI 3 cm region of acute cortical and subcortical infarction on the right affecting the deep insula and parietal lobe consistent with embolic infarction in the right MCA territory. 2 or 3 punctate acute infarctions in the right medial occipital cortex. Few clustered punctate acute infarctions in the left frontal vertex region. Probable punctate acute infarction in the inferior left cerebellum; old bilateral frontal and parietal cortical and subcortical infarctions. Brief SLE performed 05/2023 due to pain level and pt lability; partial assessment showed dysarthria and cognitive impairments in attention. Recommendation for further diagnostic evaluation in treatment.    Assessment / Plan / Recommendation  Clinical Impression  New consult received for swallowing assessment due to pt c/o of food lodging occasionally in his throat.  Oral mechanism exam was normal.  He needed assist with feeding.  Demonstrated thorough mastication of solids, the appearance of a brisk swallow  without subjective delay, and no s/s of aspiration, no c/o of globus or food sticking in throat.  Girlfriend, at bedside,  reports no difficulty with lunch today. Last MBS 06/27/23 was normal.  No indication of dysphagia today. Recommend he continue current regular solid diet with thin liquids; meds whole with water. No acute needs identified. Our service will sign of. Pt agrees with plan. SLP Visit Diagnosis: Dysphagia, unspecified (R13.10)    Aspiration Risk  No limitations    Diet Recommendation   Thin;Age appropriate regular  Medication Administration: Whole meds with liquid    Other  Recommendations Oral Care Recommendations: Oral care BID    Recommendations for follow up therapy are one component of a multi-disciplinary discharge planning process, led by the attending physician.  Recommendations may be updated based on patient status, additional functional criteria and insurance authorization.  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  Patiently recently admitted 12/18-12/23 with acute systolic heart failure; previous admission 12/4-12/12 with multifocal pneumonia with necrotizing component, s/p BAL (12/11) with negative bacterial culture and AFB/fungal cultures. Admitted with visual deficits, weakness, incoherent speech. CT negative, pt left AMA however returned and MRI 3 cm region of acute cortical and subcortical infarction on the right affecting the deep insula and parietal lobe consistent with embolic infarction in the right MCA territory. 2 or 3 punctate acute infarctions in the right medial occipital cortex. Few clustered punctate acute infarctions in the left frontal vertex region. Probable punctate acute infarction in the inferior left cerebellum; old bilateral frontal and parietal  cortical and subcortical infarctions. Brief SLE performed 05/2023 due to pain level and pt lability; partial assessment showed dysarthria and cognitive impairments in attention. Recommendation for further diagnostic evaluation in treatment. Type of Study: Bedside Swallow Evaluation Previous Swallow Assessment: multiple swallow evals during prior admissions Diet Prior to this Study: Regular;Thin liquids (Level 0) Temperature Spikes Noted: No Respiratory Status: Room air History of Recent Intubation: No Behavior/Cognition: Alert;Cooperative;Pleasant mood Oral Cavity Assessment: Within Functional Limits Oral Care Completed by SLP: No Oral Cavity - Dentition: Adequate natural dentition Vision: Functional for self-feeding Self-Feeding Abilities: Needs assist Patient Positioning: Upright in chair Baseline Vocal Quality: Normal Volitional Cough: Strong Volitional Swallow: Able to elicit    Oral/Motor/Sensory Function Overall Oral Motor/Sensory Function: Within functional limits   Ice Chips Ice chips: Within functional limits   Thin Liquid Thin Liquid: Within functional limits    Nectar Thick Nectar Thick Liquid: Not tested   Honey Thick Honey Thick Liquid: Not tested   Puree Puree: Within functional limits   Solid     Solid: Within functional limits      Blenda Mounts Laurice 09/07/2023,3:18 PM  Marchelle Folks L. Samson Frederic, MA CCC/SLP Clinical Specialist - Acute Care SLP Acute Rehabilitation Services Office number [phone]

## 2023-09-08 DIAGNOSIS — I639 Cerebral infarction, unspecified: Secondary | ICD-10-CM | POA: Diagnosis not present

## 2023-09-08 LAB — CBC WITH DIFFERENTIAL/PLATELET
Abs Immature Granulocytes: 0.02 10*3/uL (ref 0.00–0.07)
Basophils Absolute: 0 10*3/uL (ref 0.0–0.1)
Basophils Relative: 1 %
Eosinophils Absolute: 0.2 10*3/uL (ref 0.0–0.5)
Eosinophils Relative: 3 %
HCT: 34.7 % — ABNORMAL LOW (ref 39.0–52.0)
Hemoglobin: 11.1 g/dL — ABNORMAL LOW (ref 13.0–17.0)
Immature Granulocytes: 0 %
Lymphocytes Relative: 14 %
Lymphs Abs: 1.2 10*3/uL (ref 0.7–4.0)
MCH: 28 pg (ref 26.0–34.0)
MCHC: 32 g/dL (ref 30.0–36.0)
MCV: 87.4 fL (ref 80.0–100.0)
Monocytes Absolute: 0.4 10*3/uL (ref 0.1–1.0)
Monocytes Relative: 5 %
Neutro Abs: 6.9 10*3/uL (ref 1.7–7.7)
Neutrophils Relative %: 77 %
Platelets: 250 10*3/uL (ref 150–400)
RBC: 3.97 MIL/uL — ABNORMAL LOW (ref 4.22–5.81)
RDW: 15.8 % — ABNORMAL HIGH (ref 11.5–15.5)
WBC: 8.8 10*3/uL (ref 4.0–10.5)
nRBC: 0 % (ref 0.0–0.2)

## 2023-09-08 LAB — BASIC METABOLIC PANEL
Anion gap: 9 (ref 5–15)
BUN: 18 mg/dL (ref 6–20)
CO2: 20 mmol/L — ABNORMAL LOW (ref 22–32)
Calcium: 8.7 mg/dL — ABNORMAL LOW (ref 8.9–10.3)
Chloride: 110 mmol/L (ref 98–111)
Creatinine, Ser: 1.39 mg/dL — ABNORMAL HIGH (ref 0.61–1.24)
GFR, Estimated: >60 mL/min (ref >60)
Glucose, Bld: 100 mg/dL — ABNORMAL HIGH (ref 70–99)
Potassium: 4.4 mmol/L (ref 3.5–5.1)
Sodium: 139 mmol/L (ref 135–145)

## 2023-09-08 MED ORDER — HYDRALAZINE HCL 20 MG/ML IJ SOLN
5.0000 mg | INTRAMUSCULAR | Status: DC | PRN
  Administered 2023-09-08: 5 mg via INTRAVENOUS
  Filled 2023-09-08: qty 1

## 2023-09-08 NOTE — Progress Notes (Addendum)
Progress Note   Patient: Craig Schmidt ZOX:[id number] DOB: 07-04-1971 DOA: 09/03/2023     4 DOS: the patient was seen and examined on 09/08/2023   Brief hospital course: The patient is a 53 yr old man who presented to [facility] ED on 09/03/2023 with complaints of loss of vision, weakness, and inability to stand. He had been admitted to [facility] 07/09/2024 for acute systolic heart failure, and 06/26/11/12/ for multifocal pneumonia with necrotizing component s/p BAL with negative bacterial culture and AFB/fungal cultures.    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   CT head was negative, and the patient left AMA. He was unable to move his left arm at this time, and this was pointed out to the patient. Ultimately he did come back to have the MRI which confirmed a stroke. Neurology was contacted. The patient was given an aspirin and admitted to a telemetry bed. The patient's eliquis was held. CXR demonstrated left retrocardiac opacity. QTc was 519.   MRI demonstrated a large 3 cm right parietal and insular embolic infarct along with 3 small punctate infarcts in the right medial occipital lobes and left frontal vertex and left inferior cerebellum. CTA of the head/neck demonstrated a right MCA branch occlusion corresponding to the infarct.   The patient continues to have significant left arm weakness and dysarthria. He meets criteria for CIR, but must have insurance approval.  On 09/06/2023 the patient is complaining of shortness of breath and feels that he may have choked while eating. SLP was asked to re-evaluate the patient. No changes were made to his diet recommendations.   Assessment and Plan: Alcohol abuse Pt is being monitored for withdrawal. He has been advised that avoidance of alcohol  is critical to his avoiding a catastrophic CVA in the future. CIWA protocol is in place.  Acute CVA (cerebrovascular accident) [facility]) The patient has suffered a right parietal infact with a occlusion of the right MCA branch.   Dysphagia The patient is complaining of feeling that things are getting caught as he swallows. SLP was asked to re-evaluate the patient. No changes were made to his diet recommendations.  AKI Transient. Now resolved.  Dyspnea The patient states that he feels short of breath. Will order SI to encourage deep respiration.  Cocaine abuse (HCC) Pt has been advised that he must stop cocaine use to avoid catastrophic CVA in the future.  Essential hypertension Diastolic pressures are in the 811'B. Will add hydrochlorothiazide.  HFrEF (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) EF less than 20%.  Hyperlipidemia Continue lipitor 40 mg daily.  Hypertension, malignant Improved.  Malnutrition of moderate degree Nutrition consulted.     Subjective: The patient is resting comfortably. No new complaints.  Physical Exam: Vitals:   09/08/23 0600 09/08/23 0656 09/08/23 0900 09/08/23 1200  BP: 136/82 (!) 152/101 (!) 150/117 (!) 145/112  Pulse: (!) 102 100 95 99  Resp: 20 20 18 18   Temp: 98.5 F (36.9 C) 98.5 F (36.9 C) 98.4 F (36.9 C) 98.3 F (36.8 C)  TempSrc: Axillary Axillary Axillary Oral  SpO2: 100% 100% 99% 100%  Weight:      Height:       Exam:  Constitutional:  The patient is awake, alert, and oriented x 3. No acute distress. Respiratory:  No increased work  of breathing. No wheezes, rales, or rhonchi No tactile fremitus Cardiovascular:  Regular rate and rhythm No murmurs, ectopy, or gallups. No lateral PMI. No thrills. Abdomen:  Abdomen is soft, non-tender, non-distended No hernias, masses, or organomegaly Normoactive bowel sounds.  Musculoskeletal:  No cyanosis, clubbing, or edema Skin:  No rashes, lesions, ulcers palpation  of skin: no induration or nodules Neurologic:  CN 2-12 intact Sensation all 4 extremities intact Psychiatric:  Mental status Mood, affect appropriate Orientation to person, place, time  judgment and insight appear intact  Data Reviewed:  CBC BMP  Family Communication: Friend is at bedside.  Disposition: Status is: Inpatient Remains inpatient appropriate because:   Planned Discharge Destination: Rehab    Time spent: 32 minutes  Author: Alicen Donalson, DO 09/08/2023 2:42 PM  For on call review [email or web address].

## 2023-09-08 NOTE — Plan of Care (Signed)
  Problem: Education: Goal: Knowledge of disease or condition will improve Outcome: Progressing   Problem: Coping: Goal: Will identify appropriate support needs Outcome: Progressing   Problem: Self-Care: Goal: Ability to participate in self-care as condition permits will improve Outcome: Progressing Goal: Ability to communicate needs accurately will improve Outcome: Progressing   Problem: Nutrition: Goal: Risk of aspiration will decrease Outcome: Progressing Goal: Dietary intake will improve Outcome: Progressing   Problem: Education: Goal: Knowledge of General Education information will improve Description: Including pain rating scale, medication(s)/side effects and non-pharmacologic comfort measures Outcome: Progressing

## 2023-09-08 NOTE — Plan of Care (Signed)
Notified MD for elevated blood pressure, no prn meds given. Assisted on bedside commode, comfortable as of this time.  Problem: Education: Goal: Knowledge of disease or condition will improve Outcome: Progressing   Problem: Ischemic Stroke/TIA Tissue Perfusion: Goal: Complications of ischemic stroke/TIA will be minimized Outcome: Progressing   Problem: Health Behavior/Discharge Planning: Goal: Ability to manage health-related needs will improve Outcome: Progressing   Problem: Self-Care: Goal: Ability to participate in self-care as condition permits will improve Outcome: Progressing   Problem: Clinical Measurements: Goal: Will remain free from infection Outcome: Progressing   Problem: Clinical Measurements: Goal: Respiratory complications will improve Outcome: Progressing   Problem: Safety: Goal: Ability to remain free from injury will improve Outcome: Progressing

## 2023-09-09 DIAGNOSIS — E785 Hyperlipidemia, unspecified: Secondary | ICD-10-CM

## 2023-09-09 DIAGNOSIS — I639 Cerebral infarction, unspecified: Secondary | ICD-10-CM | POA: Diagnosis not present

## 2023-09-09 DIAGNOSIS — I5020 Unspecified systolic (congestive) heart failure: Secondary | ICD-10-CM | POA: Diagnosis not present

## 2023-09-09 LAB — CBC WITH DIFFERENTIAL/PLATELET
Abs Immature Granulocytes: 0.02 10*3/uL (ref 0.00–0.07)
Basophils Absolute: 0 10*3/uL (ref 0.0–0.1)
Basophils Relative: 0 %
Eosinophils Absolute: 0.3 10*3/uL (ref 0.0–0.5)
Eosinophils Relative: 4 %
HCT: 35 % — ABNORMAL LOW (ref 39.0–52.0)
Hemoglobin: 11.1 g/dL — ABNORMAL LOW (ref 13.0–17.0)
Immature Granulocytes: 0 %
Lymphocytes Relative: 20 %
Lymphs Abs: 1.8 10*3/uL (ref 0.7–4.0)
MCH: 27.8 pg (ref 26.0–34.0)
MCHC: 31.7 g/dL (ref 30.0–36.0)
MCV: 87.5 fL (ref 80.0–100.0)
Monocytes Absolute: 0.5 10*3/uL (ref 0.1–1.0)
Monocytes Relative: 5 %
Neutro Abs: 6.4 10*3/uL (ref 1.7–7.7)
Neutrophils Relative %: 71 %
Platelets: 260 10*3/uL (ref 150–400)
RBC: 4 MIL/uL — ABNORMAL LOW (ref 4.22–5.81)
RDW: 15.9 % — ABNORMAL HIGH (ref 11.5–15.5)
WBC: 9 10*3/uL (ref 4.0–10.5)
nRBC: 0 % (ref 0.0–0.2)

## 2023-09-09 LAB — BASIC METABOLIC PANEL
Anion gap: 9 (ref 5–15)
BUN: 17 mg/dL (ref 6–20)
CO2: 20 mmol/L — ABNORMAL LOW (ref 22–32)
Calcium: 8.6 mg/dL — ABNORMAL LOW (ref 8.9–10.3)
Chloride: 110 mmol/L (ref 98–111)
Creatinine, Ser: 1.56 mg/dL — ABNORMAL HIGH (ref 0.61–1.24)
GFR, Estimated: 53 mL/min — ABNORMAL LOW (ref >60)
Glucose, Bld: 110 mg/dL — ABNORMAL HIGH (ref 70–99)
Potassium: 4.3 mmol/L (ref 3.5–5.1)
Sodium: 139 mmol/L (ref 135–145)

## 2023-09-09 MED ORDER — TRAZODONE HCL 50 MG PO TABS
50.0000 mg | ORAL_TABLET | Freq: Every evening | ORAL | Status: DC | PRN
  Administered 2023-09-09 – 2023-09-14 (×3): 50 mg via ORAL
  Filled 2023-09-09 (×3): qty 1

## 2023-09-09 MED ORDER — SPIRONOLACTONE 12.5 MG HALF TABLET
12.5000 mg | ORAL_TABLET | Freq: Every day | ORAL | Status: DC
  Administered 2023-09-09 – 2023-09-13 (×5): 12.5 mg via ORAL
  Filled 2023-09-09 (×5): qty 1

## 2023-09-09 MED ORDER — HYDROCHLOROTHIAZIDE 25 MG PO TABS
25.0000 mg | ORAL_TABLET | Freq: Every day | ORAL | Status: DC
  Administered 2023-09-09: 25 mg via ORAL
  Filled 2023-09-09: qty 1

## 2023-09-09 MED ORDER — FUROSEMIDE 40 MG PO TABS
40.0000 mg | ORAL_TABLET | Freq: Every day | ORAL | Status: DC
  Administered 2023-09-10 – 2023-09-13 (×4): 40 mg via ORAL
  Filled 2023-09-09 (×4): qty 1

## 2023-09-09 MED ORDER — GABAPENTIN 100 MG PO CAPS
200.0000 mg | ORAL_CAPSULE | Freq: Two times a day (BID) | ORAL | Status: DC
Start: 2023-09-09 — End: 2023-09-20
  Administered 2023-09-09 – 2023-09-19 (×22): 200 mg via ORAL
  Filled 2023-09-09 (×22): qty 2

## 2023-09-09 NOTE — Progress Notes (Signed)
Occupational Therapy Treatment Patient Details Name: Craig Schmidt MRN: [id number] DOB: 1970-07-30 Today's Date: 09/09/2023   History of present illness 53 y.o. male presenting to ED 2/12 with L sided weakness and confusion. MRI found a right parietal infarct as well as other small punctate infarcts. 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   OT comments  Pt up on EOB with PT on arrival and PT educating pt regarding mobility progression. Pt reports pain in bil feet needing encouragement for OOB today. Pt requiring total A for mobility and transfer bed to chair this session. Pt with poor insight into deficits and safety throughout. Pt refused to attempt to work on visual scanning strategies. Pt with diplopia reported throughout. Provided visual occlusive glasses. Due to poor progression and pt with limited motivation and support, updated dc recommendation to inpatient rehab <3 hours/day.       If plan is discharge home, recommend the following:  A lot of help with walking and/or transfers;A lot of help with bathing/dressing/bathroom;Assistance with cooking/housework;Direct supervision/assist for medications management;Assist for transportation;Two people to help with walking and/or transfers;Two people to help with bathing/dressing/bathroom;Direct supervision/assist for financial management;Help with stairs or ramp for entrance   Equipment Recommendations  None recommended by OT (defer)    Recommendations for Other Services      Precautions / Restrictions Precautions Precautions: Fall Precaution/Restrictions Comments: reports ocassional falling Restrictions Weight Bearing Restrictions Per Provider Order: No       Mobility Bed Mobility               General bed mobility comments: EOB with PT on arrival    Transfers Overall transfer level: Needs  assistance   Transfers: Bed to chair/wheelchair/BSC Sit to Stand: Total assist, +2 physical assistance Stand pivot transfers: Total assist, +2 physical assistance         General transfer comment: pt providing no assist in movement from bed to recliner, needing total A fro stand pivot to recliner. Repeated education on need for consistent work with therapy and pt with poor insight reporting if he had rollator he could do it himself     Balance Overall balance assessment: Needs assistance Sitting-balance support: Feet supported, No upper extremity supported Sitting balance-Leahy Scale: Zero Sitting balance - Comments: requires outside assist to maintain   Standing balance support: Bilateral upper extremity supported, During functional activity Standing balance-Leahy Scale: Zero Standing balance comment: reliant on ext support                           ADL either performed or assessed with clinical judgement   ADL Overall ADL's : Needs assistance/impaired                         Toilet Transfer: +2 for safety/equipment;Total assistance;+2 for physical assistance                  Extremity/Trunk Assessment Upper Extremity Assessment Upper Extremity Assessment: LUE deficits/detail RUE Deficits / Details: ROM WFL, able to touch top of head with a bit of cervical flexion. Prior CVA deficits, generally weak with a weak grasp. LUE Deficits / Details: Pt with poor initiation of LUE today and poor attention to L side of body. Pt able to perform composite flexion and extension and elbow ROM to 90. Did not  functionally grasp this session but limited by mood state and willingness to participate LUE Sensation: decreased light touch LUE Coordination: decreased fine motor;decreased gross motor   Lower Extremity Assessment Lower Extremity Assessment: Defer to PT evaluation        Vision   Vision Assessment?: Vision impaired- to be further tested in functional  context Additional Comments: Pt constantly reports diplopia with some undershooting. pt with relief from visual occlusion glasses with occlusion to L eye. VISION needs continued assessment   Perception Perception Perception: Impaired Preception Impairment Details: Inattention/Neglect   Praxis Praxis Praxis: Impaired Praxis Impairment Details: Limb apraxia   Communication Communication Communication: Impaired Factors Affecting Communication: Reduced clarity of speech;Difficulty expressing self   Cognition Arousal: Alert Behavior During Therapy: Flat affect, Agitated Cognition: Cognition impaired Difficult to assess due to: Impaired communication   Awareness: Intellectual awareness intact (aware of L weakness, however, continues to believe he can walk with rollator without assist. poor isight)   Attention impairment (select first level of impairment): Focused attention Executive functioning impairment (select all impairments): Initiation, Sequencing, Problem solving OT - Cognition Comments: agitated today reporting BLE pain and poor sleep last night. Can follow one step commands with increased time and cues.                 Following commands: Impaired Following commands impaired: Follows one step commands with increased time      Cueing   Cueing Techniques: Verbal cues, Gestural cues, Tactile cues, Visual cues  Exercises Exercises: Other exercises Other Exercises Other Exercises: hand/wrist/elbow AROM. Pt fatiguing before getting to shoulder. Pt provided PROM to shoulder.    Shoulder Instructions       General Comments pt SO Anna in room does not provide support to need for participation, although requested pain medication on his behalf when OT returns to room with glasses    Pertinent Vitals/ Pain       Pain Assessment Pain Assessment: Faces Faces Pain Scale: Hurts little more Pain Location: bilateral feet and toes Pain Descriptors / Indicators: Grimacing,  Guarding Pain Intervention(s): Limited activity within patient's tolerance, Monitored during session  Home Living                                          Prior Functioning/Environment              Frequency  Min 1X/week        Progress Toward Goals  OT Goals(current goals can now be found in the care plan section)  Progress towards OT goals: Progressing toward goals  Acute Rehab OT Goals Patient Stated Goal: get better OT Goal Formulation: With patient/family Time For Goal Achievement: 09/18/23 Potential to Achieve Goals: Good ADL Goals Pt Will Perform Eating: with set-up;sitting;with adaptive utensils Pt Will Perform Grooming: sitting;with adaptive equipment;with min assist Pt Will Perform Upper Body Bathing: sitting;with min assist;with adaptive equipment Pt Will Transfer to Toilet: ambulating;with contact guard assist Pt/caregiver will Perform Home Exercise Program: Increased strength;Increased ROM;Left upper extremity;With written HEP provided Additional ADL Goal #2: Pt will utilize compensatory strategies to locate 3/3 objects on his L side for ADLs  Plan      Co-evaluation                 AM-PAC OT "6 Clicks" Daily Activity     Outcome Measure   Help from another person eating meals?: A  Lot Help from another person taking care of personal grooming?: A Lot Help from another person toileting, which includes using toliet, bedpan, or urinal?: Total Help from another person bathing (including washing, rinsing, drying)?: Total Help from another person to put on and taking off regular upper body clothing?: Total Help from another person to put on and taking off regular lower body clothing?: Total 6 Click Score: 8    End of Session Equipment Utilized During Treatment: Gait belt  OT Visit Diagnosis: Muscle weakness (generalized) (M62.81);Unsteadiness on feet (R26.81);Other abnormalities of gait and mobility (R26.89);Hemiplegia and  hemiparesis;Cognitive communication deficit (R41.841) Symptoms and signs involving cognitive functions: Cerebral infarction Hemiplegia - Right/Left: Left Hemiplegia - caused by: Cerebral infarction   Activity Tolerance Patient tolerated treatment well   Patient Left in chair;with call bell/phone within reach;with chair alarm set   Nurse Communication Mobility status        Time: 4098-1191 OT Time Calculation (min): 27 min  Charges: OT General Charges $OT Visit: 1 Visit OT Treatments $Self Care/Home Management : 8-22 mins  Tyler Deis, OTR/L [facility] Acute Rehabilitation Office: [phone]   Myrla Halsted 09/09/2023, 1:15 PM

## 2023-09-09 NOTE — Progress Notes (Addendum)
[facility] PROGRESS NOTE   Craig Schmidt:[id number] DOB: 1971/05/15 DOA: 09/03/2023  PCP: Rema Fendt, NP  Brief History: 53 yr old man who presented to [facility] ED on 09/03/2023 with complaints of loss of vision, weakness, and inability to stand. He had been admitted to [facility] 07/09/2024 for acute systolic heart failure, and 06/26/23 for multifocal pneumonia with necrotizing component s/p BAL with negative bacterial culture and AFB/fungal cultures.   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   CT head was negative, and the patient left AMA. He was unable to move his left arm at this time, and this was pointed out to the patient. Ultimately he did come back to have the MRI which confirmed a stroke.  MRI demonstrated a large 3 cm right parietal and insular embolic infarct along with 3 small punctate infarcts in the right medial occipital lobes and left frontal vertex and left inferior cerebellum. CTA of the head/neck demonstrated a right MCA branch occlusion corresponding to the infarct.    Consultants: Neurology   Subjective/Interval History: Patient complains of having difficulty sleeping at night.  Also complains of sharp shooting pain in both his feet.   Assessment/Plan:  Acute CVA (cerebrovascular accident) [facility]) The patient has suffered a right parietal infact with a occlusion of the right MCA branch.  Seen by neurology. Counseled regarding his polysubstance abuse. Noted to be on aspirin and Eliquis.  Noted to be on statin. LDL noted to be 77.  He underwent recent echocardiogram in December which showed LVEF of less than 20%.  HbA1c was 5.6 in November. Seen by PT and OT.  Inpatient rehab was recommended. Experiencing neuropathic pain in his feet.  Good  pulses noted.  Will start gabapentin.   Dysphagia Seen by speech therapy.  Remains on heart healthy diet.     AKI Transient. Now resolved.   Dyspnea The patient states that he feels short of breath.  Likely due to his CHF.  Saturations are normal.   Cocaine abuse (HCC) Pt has been advised that he must stop cocaine use to avoid catastrophic CVA in the future.   Essential hypertension Noted to be on HCTZ   HFrEF (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) EF less than 20%.  Patient continues to do illicit drugs especially cocaine.   Home medication list reviewed.  He is supposed to be on carvedilol, Jardiance, furosemide and Entresto along with spironolactone.   Unclear if the patient has been compliant with any of these medications.  No beta-blocker due to cocaine use.  Creatinine is elevated send will hold off on Entresto since his compliance has been questionable.  This can be addressed in the outpatient setting. Change over HCTZ to furosemide.   Chronic kidney disease disease stage II Monitor renal function closely.   Hyperlipidemia Continue lipitor 40 mg daily.   Hypertension, malignant Improved.   Alcohol abuse Pt is being monitored for withdrawal. He has been advised that avoidance of alcohol is critical to his avoiding a catastrophic CVA in the future. CIWA protocol is in place.    DVT Prophylaxis: On apixaban Code Status: Full code Family Communication: Discussed with patient and his significant other Disposition Plan: Await CIR     Medications: Scheduled:  apixaban  5 mg Oral BID   aspirin  EC  81 mg Oral Daily   atorvastatin  40 mg Oral Daily   folic acid  1 mg Oral Daily   gabapentin  200 mg Oral BID   hydrochlorothiazide  25 mg Oral Daily   influenza vac split trivalent PF  0.5 mL Intramuscular Tomorrow-1000   LORazepam  0-4 mg Oral Q12H   mirtazapine  15 mg Oral QHS   multivitamin with minerals  1 tablet Oral Daily   nicotine  14 mg  Transdermal Daily   pneumococcal 20-valent conjugate vaccine  0.5 mL Intramuscular Tomorrow-1000   thiamine  100 mg Oral Daily   Or   thiamine  100 mg Intravenous Daily   Continuous: UVO:[id number] **OR** acetaminophen (TYLENOL) oral liquid 160 mg/5 mL **OR** acetaminophen, hydrALAZINE, LORazepam **OR** LORazepam, senna-docusate, traZODone  Antibiotics: Anti-infectives (From admission, onward)    None       Objective:  Vital Signs  Vitals:   09/09/23 0245 09/09/23 0400 09/09/23 0809 09/09/23 0943  BP: (!) 150/120 (!) 147/110 (!) 136/104 (!) 130/93  Pulse: (!) 108 61 100 (!) 104  Resp: 20 18 20    Temp: 98.7 F (37.1 C) 97.7 F (36.5 C) 98 F (36.7 C)   TempSrc: Oral Oral Oral   SpO2: 100% 98% 99% 93%  Weight:      Height:        Intake/Output Summary (Last 24 hours) at 09/09/2023 1008 Last data filed at 09/09/2023 0000 Gross per 24 hour  Intake --  Output 300 ml  Net -300 ml   Filed Weights   09/04/23 1017  Weight: 68 kg    General appearance: Awake alert.  In no distress Resp: Clear to auscultation bilaterally.  Normal effort Cardio: S1-S2 is normal regular.  No S3-S4.  No rubs murmurs or bruit GI: Abdomen is soft.  Nontender nondistended.  Bowel sounds are present normal.  No masses organomegaly    Lab Results:  Data Reviewed: I have personally reviewed following labs and reports of the imaging studies  CBC: Recent Labs  Lab 09/03/23 1634 09/04/23 0551 09/05/23 1344 09/06/23 0935 09/08/23 1210 09/09/23 0834  WBC 8.1 7.0 7.2 7.4 8.8 9.0  NEUTROABS 5.7  --  4.8 5.1 6.9 6.4  HGB 11.3* 10.5* 10.5* 10.5* 11.1* 11.1*  HCT 37.2* 32.8* 33.5* 33.3* 34.7* 35.0*  MCV 92.3 88.9 90.1 90.0 87.4 87.5  PLT 235 217 208 202 250 260    Basic Metabolic Panel: Recent Labs  Lab 09/03/23 1634 09/04/23 0132 09/05/23 1344 09/06/23 0935 09/08/23 1210 09/09/23 0834  NA 137  --  139 139 139 139  K 4.2  --  4.4 4.6 4.4 4.3  CL 106  --  110 110 110 110  CO2  20*  --  20* 20* 20* 20*  GLUCOSE 97  --  111* 126* 100* 110*  BUN 18  --  23* 20 18 17   CREATININE 1.26*  --  1.65* 1.37* 1.39* 1.56*  CALCIUM 9.2  --  8.8* 8.6* 8.7* 8.6*  MG  --  2.0  --   --   --   --   PHOS  --  3.1  --   --   --   --     GFR: Estimated Creatinine Clearance: 52.4 mL/min (A) (by C-G formula based on SCr of 1.56 mg/dL (H)).  Liver Function Tests: Recent Labs  Lab 09/03/23 1634  AST 28  ALT 8  ALKPHOS 115  BILITOT 1.1  PROT 7.3  ALBUMIN 3.4*  CBG: Recent Labs  Lab 09/03/23 1645  GLUCAP 76     Recent Results (from the past 240 hours)  Resp panel by RT-PCR (RSV, Flu A&B, Covid) Anterior Nasal Swab     Status: None   Collection Time: 09/03/23  2:43 PM   Specimen: Anterior Nasal Swab  Result Value Ref Range Status   SARS Coronavirus 2 by RT PCR NEGATIVE NEGATIVE Final    Comment: (NOTE) SARS-CoV-2 target nucleic acids are NOT DETECTED.  The SARS-CoV-2 RNA is generally detectable in upper respiratory specimens during the acute phase of infection. The lowest concentration of SARS-CoV-2 viral copies this assay can detect is 138 copies/mL. A negative result does not preclude SARS-Cov-2 infection and should not be used as the sole basis for treatment or other patient management decisions. A negative result may occur with  improper specimen collection/handling, submission of specimen other than nasopharyngeal swab, presence of viral mutation(s) within the areas targeted by this assay, and inadequate number of viral copies(<138 copies/mL). A negative result must be combined with clinical observations, patient history, and epidemiological information. The expected result is Negative.  Fact Sheet for Patients:  BloggerCourse.com  Fact Sheet for Healthcare Providers:  SeriousBroker.it  This test is no t yet approved or cleared by the [place] FDA and  has been authorized for detection and/or  diagnosis of SARS-CoV-2 by FDA under an Emergency Use Authorization (EUA). This EUA will remain  in effect (meaning this test can be used) for the duration of the COVID-19 declaration under Section 564(b)(1) of the Act, 21 U.S.C.section 360bbb-3(b)(1), unless the authorization is terminated  or revoked sooner.       Influenza A by PCR NEGATIVE NEGATIVE Final   Influenza B by PCR NEGATIVE NEGATIVE Final    Comment: (NOTE) The Xpert Xpress SARS-CoV-2/FLU/RSV plus assay is intended as an aid in the diagnosis of influenza from Nasopharyngeal swab specimens and should not be used as a sole basis for treatment. Nasal washings and aspirates are unacceptable for Xpert Xpress SARS-CoV-2/FLU/RSV testing.  Fact Sheet for Patients: BloggerCourse.com  Fact Sheet for Healthcare Providers: SeriousBroker.it  This test is not yet approved or cleared by the [place] FDA and has been authorized for detection and/or diagnosis of SARS-CoV-2 by FDA under an Emergency Use Authorization (EUA). This EUA will remain in effect (meaning this test can be used) for the duration of the COVID-19 declaration under Section 564(b)(1) of the Act, 21 U.S.C. section 360bbb-3(b)(1), unless the authorization is terminated or revoked.     Resp Syncytial Virus by PCR NEGATIVE NEGATIVE Final    Comment: (NOTE) Fact Sheet for Patients: BloggerCourse.com  Fact Sheet for Healthcare Providers: SeriousBroker.it  This test is not yet approved or cleared by the [place] FDA and has been authorized for detection and/or diagnosis of SARS-CoV-2 by FDA under an Emergency Use Authorization (EUA). This EUA will remain in effect (meaning this test can be used) for the duration of the COVID-19 declaration under Section 564(b)(1) of the Act, 21 U.S.C. section 360bbb-3(b)(1), unless the authorization is terminated  or revoked.  Performed at [facility], [address], [place], [place] [address]       Radiology Studies: No results found.     LOS: 5 days   Kitti Mcclish [employer] on [email or web address]  09/09/2023, 10:08 AM

## 2023-09-09 NOTE — Progress Notes (Signed)
Physical Therapy Treatment Patient Details Name: Craig Schmidt MRN: [id number] DOB: October 14, 1970 Today's Date: 09/09/2023   History of Present Illness 53 y.o. male presenting to ED 2/12 with L sided weakness and confusion. MRI found a right parietal infarct as well as other small punctate infarcts. 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    PT Comments  Pt asleep in bed on entry, irritated about being awoke, reports he can not participate due to not sleeping last night due to pain in his feet. Provided pt with increased education on need for consistent therapy post stroke to be able to regain function. Pt requiring total A for coming to EoB, and total Ax2 for transfer to recliner. Pt reporting he wants a Rollator so he can just go home. Discussed pt needing to demonstrate safety with RW before progressing back to Rollator. Pt repots he just wants to go home. D/c plan remains appropriate at this time. PT will continue to follow acutely.     If plan is discharge home, recommend the following: A lot of help with walking and/or transfers;A lot of help with bathing/dressing/bathroom;Assistance with cooking/housework;Direct supervision/assist for medications management;Direct supervision/assist for financial management;Assist for transportation;Help with stairs or ramp for entrance;Supervision due to cognitive status   Can travel by private vehicle      Maybe   Equipment Recommendations  Rollator (4 wheels);BSC/3in1       Precautions / Restrictions Precautions Precautions: Fall Precaution/Restrictions Comments: reports ocassional falling Restrictions Weight Bearing Restrictions Per Provider Order: No     Mobility  Bed Mobility Overal bed mobility: Needs Assistance Bed Mobility: Supine to Sit, Sit to Supine     Supine to sit: Total assist     General bed  mobility comments: despite being awake, pt provides little to no assist in coming to EoB.    Transfers Overall transfer level: Needs assistance   Transfers: Bed to chair/wheelchair/BSC Sit to Stand: Total assist, +2 physical assistance Stand pivot transfers: Total assist, +2 physical assistance         General transfer comment: pt providing no assist in movement from bed to recliner, needing total A fro stand pivot to recliner. Repeated education on need for consistent work with therapy    Ambulation/Gait               General Gait Details: pt refusal         Balance Overall balance assessment: Needs assistance Sitting-balance support: Feet supported, No upper extremity supported Sitting balance-Leahy Scale: Zero Sitting balance - Comments: requires outside assist to maintain   Standing balance support: Bilateral upper extremity supported, During functional activity Standing balance-Leahy Scale: Zero Standing balance comment: reliant on ext support                            Communication Communication Communication: Impaired Factors Affecting Communication: Reduced clarity of speech;Difficulty expressing self  Cognition Arousal: Alert Behavior During Therapy: Flat affect, Agitated   PT - Cognitive impairments: Sequencing, Problem solving, Awareness, Initiation, Attention, Safety/Judgement                       PT - Cognition Comments: pt asleep on entry, upset when  roused stating that he did not sleep last night, educated on need to be up moving around during the day so  that it is easier to go to sleep at night, very difficult to encourage pt to partcipate in therapy Following commands: Impaired Following commands impaired: Follows one step commands with increased time    Cueing Cueing Techniques: Verbal cues, Gestural cues, Tactile cues, Visual cues     General Comments General comments (skin integrity, edema, etc.): pt SO Anna in room  does not provide support to need for participation      Pertinent Vitals/Pain Pain Assessment Pain Assessment: Faces Faces Pain Scale: Hurts little more Pain Location: bilateral feet and toes Pain Descriptors / Indicators: Grimacing, Guarding Pain Intervention(s): Limited activity within patient's tolerance, Monitored during session, Repositioned     PT Goals (current goals can now be found in the care plan section) Acute Rehab PT Goals PT Goal Formulation: With patient/family Time For Goal Achievement: 09/18/23 Potential to Achieve Goals: Fair Progress towards PT goals: Not progressing toward goals - comment    Frequency    Min 1X/week       AM-PAC PT "6 Clicks" Mobility   Outcome Measure  Help needed turning from your back to your side while in a flat bed without using bedrails?: None Help needed moving from lying on your back to sitting on the side of a flat bed without using bedrails?: A Little Help needed moving to and from a bed to a chair (including a wheelchair)?: A Lot Help needed standing up from a chair using your arms (e.g., wheelchair or bedside chair)?: A Lot Help needed to walk in hospital room?: Total Help needed climbing 3-5 steps with a railing? : Total 6 Click Score: 13    End of Session Equipment Utilized During Treatment: Gait belt Activity Tolerance: Treatment limited secondary to agitation (reporting not sleeping last night) Patient left: with call bell/phone within reach;in chair;with chair alarm set;Other (comment) (working with OT) Nurse Communication: Mobility status PT Visit Diagnosis: Unsteadiness on feet (R26.81);Other abnormalities of gait and mobility (R26.89);History of falling (Z91.81);Muscle weakness (generalized) (M62.81);Difficulty in walking, not elsewhere classified (R26.2);Other symptoms and signs involving the nervous system (R29.898);Hemiplegia and hemiparesis Hemiplegia - Right/Left: Left Hemiplegia - dominant/non-dominant:  Non-dominant Hemiplegia - caused by: Cerebral infarction     Time: 1610-9604 PT Time Calculation (min) (ACUTE ONLY): 28 min  Charges:    $Therapeutic Activity: 23-37 mins PT General Charges $$ ACUTE PT VISIT: 1 Visit                     Tracyann Duffell B. Beverely Risen PT, DPT Acute Rehabilitation Services Please use secure chat or  Call Office [phone]    Elon Alas [facility] 09/09/2023, 11:51 AM

## 2023-09-09 NOTE — Progress Notes (Signed)
Inpatient Rehab Admissions Coordinator:   Attempted to meet with pt at bedside.  S/O present, stating pt hasn't slept and is very agitated.  Requested I return.  Note total assist with therapy today, requiring ongoing education on importance of participation.  Updated TOC, not yet at a level to consider for CIR.   Estill Dooms, PT, DPT Admissions Coordinator [phone] 09/09/23  2:30 PM

## 2023-09-10 DIAGNOSIS — I639 Cerebral infarction, unspecified: Secondary | ICD-10-CM | POA: Diagnosis not present

## 2023-09-10 DIAGNOSIS — E785 Hyperlipidemia, unspecified: Secondary | ICD-10-CM | POA: Diagnosis not present

## 2023-09-10 DIAGNOSIS — I5020 Unspecified systolic (congestive) heart failure: Secondary | ICD-10-CM | POA: Diagnosis not present

## 2023-09-10 NOTE — Plan of Care (Signed)
   Problem: Activity: Goal: Risk for activity intolerance will decrease Outcome: Progressing

## 2023-09-10 NOTE — Progress Notes (Addendum)
[facility] PROGRESS NOTE   Craig Schmidt AOZ:[id number] DOB: 1970-07-29 DOA: 09/03/2023  PCP: Rema Fendt, NP  Brief History: 53 yr old man who presented to [facility] ED on 09/03/2023 with complaints of loss of vision, weakness, and inability to stand. He had been admitted to [facility] 07/09/2024 for acute systolic heart failure, and 06/26/23 for multifocal pneumonia with necrotizing component s/p BAL with negative bacterial culture and AFB/fungal cultures.   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   CT head was negative, and the patient left AMA. He was unable to move his left arm at this time, and this was pointed out to the patient. Ultimately he did come back to have the MRI which confirmed a stroke.  MRI demonstrated a large 3 cm right parietal and insular embolic infarct along with 3 small punctate infarcts in the right medial occipital lobes and left frontal vertex and left inferior cerebellum. CTA of the head/neck demonstrated a right MCA branch occlusion corresponding to the infarct.    Consultants: Neurology   Subjective/Interval History: Patient asleep this morning.  Denies any complaints.  Wanted to be left alone.    Assessment/Plan:  Acute CVA (cerebrovascular accident) (HCC)/concern for embolic etiology The patient has suffered a right parietal infact with a occlusion of the right MCA branch.  Seen by neurology. Counseled regarding his polysubstance abuse. Noted to be on aspirin and Eliquis.  Noted to be on statin. LDL noted to be 77.  He underwent recent echocardiogram in December which showed LVEF of less than 20%.  HbA1c was 5.6 in November. Seen by PT and OT.  Inpatient rehab was recommended. Started on gabapentin for neuropathic pain in his feet.    Dysphagia Seen by speech therapy.  Remains on heart healthy diet.     AKI Transient. Now resolved.   Dyspnea The patient states that he feels short of breath.  Likely due to his CHF.  Saturations are normal.   Cocaine abuse (HCC) Pt has been advised that he must stop cocaine use to avoid catastrophic CVA in the future.   Essential hypertension Noted to be on HCTZ   HFrEF (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) EF less than 20%.  Patient continues to do illicit drugs especially cocaine.   Home medication list reviewed.  He is supposed to be on carvedilol, Jardiance, furosemide and Entresto along with spironolactone.   Unclear if the patient has been compliant with any of these medications.  No beta-blocker due to cocaine use.   Creatinine is elevated and so will hold off on Entresto since his compliance has been questionable.  This can be addressed in the outpatient setting. Changed over HCTZ to furosemide.  Continue with spironolactone.  Chronic kidney disease disease stage II Monitor renal function closely.  Seems to be close to baseline at this time.  Labs are pending from today.  Hyperlipidemia Continue lipitor 40 mg daily.   Hypertension, malignant Continue to monitor.  Seems to be stable.  Just on diuretics at this time.   Alcohol abuse Placed on CIWA protocol.  No evidence for withdrawal currently.  Continue thiamine multivitamins.    DVT Prophylaxis: On apixaban Code Status: Full code Family Communication: Discussed with patient and his significant other Disposition Plan: Await CIR  Medications: Scheduled:  apixaban  5 mg Oral BID   aspirin EC  81 mg Oral Daily   atorvastatin  40 mg Oral Daily   folic acid  1 mg Oral Daily   furosemide  40 mg Oral Daily   gabapentin  200 mg Oral BID   LORazepam  0-4 mg Oral Q12H   mirtazapine  15 mg Oral QHS   multivitamin with minerals  1 tablet Oral Daily   nicotine  14 mg Transdermal Daily    pneumococcal 20-valent conjugate vaccine  0.5 mL Intramuscular Tomorrow-1000   spironolactone  12.5 mg Oral Daily   thiamine  100 mg Oral Daily   Or   thiamine  100 mg Intravenous Daily   Continuous: [id number] **OR** acetaminophen (TYLENOL) oral liquid 160 mg/5 mL **OR** acetaminophen, hydrALAZINE, senna-docusate, traZODone    Objective:  Vital Signs  Vitals:   09/09/23 2013 09/10/23 0006 09/10/23 0423 09/10/23 0759  BP: (!) 144/101 (!) 126/93 127/89 (!) 145/108  Pulse: 93 88 89 98  Resp: 18 17 16 20   Temp: 97.7 F (36.5 C) 98.2 F (36.8 C) 97.8 F (36.6 C) 98.4 F (36.9 C)  TempSrc: Oral Oral  Oral  SpO2: 100% 96% 99% 100%  Weight:      Height:       No intake or output data in the 24 hours ending 09/10/23 1055  Filed Weights   09/04/23 1017  Weight: 68 kg    General appearance: Somnolent but easily arousable.  In no distress. Resp: Clear to auscultation bilaterally.  Normal effort Cardio: S1-S2 is normal regular.  No S3-S4.  No rubs murmurs or bruit GI: Abdomen is soft.  Nontender nondistended.  Bowel sounds are present normal.  No masses organomegaly    Lab Results:  Data Reviewed: I have personally reviewed following labs and reports of the imaging studies  CBC: Recent Labs  Lab 09/03/23 1634 09/04/23 0551 09/05/23 1344 09/06/23 0935 09/08/23 1210 09/09/23 0834  WBC 8.1 7.0 7.2 7.4 8.8 9.0  NEUTROABS 5.7  --  4.8 5.1 6.9 6.4  HGB 11.3* 10.5* 10.5* 10.5* 11.1* 11.1*  HCT 37.2* 32.8* 33.5* 33.3* 34.7* 35.0*  MCV 92.3 88.9 90.1 90.0 87.4 87.5  PLT 235 217 208 202 250 260    Basic Metabolic Panel: Recent Labs  Lab 09/03/23 1634 09/04/23 0132 09/05/23 1344 09/06/23 0935 09/08/23 1210 09/09/23 0834  NA 137  --  139 139 139 139  K 4.2  --  4.4 4.6 4.4 4.3  CL 106  --  110 110 110 110  CO2 20*  --  20* 20* 20* 20*  GLUCOSE 97  --  111* 126* 100* 110*  BUN 18  --  23* 20 18 17   CREATININE 1.26*  --  1.65* 1.37* 1.39* 1.56*  CALCIUM  9.2  --  8.8* 8.6* 8.7* 8.6*  MG  --  2.0  --   --   --   --   PHOS  --  3.1  --   --   --   --     GFR: Estimated Creatinine Clearance: 52.4 mL/min (A) (by C-G formula based on SCr of 1.56 mg/dL (H)).  Liver Function Tests: Recent Labs  Lab 09/03/23 1634  AST 28  ALT 8  ALKPHOS 115  BILITOT 1.1  PROT 7.3  ALBUMIN 3.4*     CBG: Recent Labs  Lab 09/03/23 1645  GLUCAP 76     Recent Results (from the past 240 hours)  Resp panel by RT-PCR (RSV, Flu A&B, Covid) Anterior Nasal Swab     Status: None   Collection Time: 09/03/23  2:43 PM   Specimen: Anterior Nasal Swab  Result Value Ref Range Status   SARS Coronavirus 2 by RT PCR NEGATIVE NEGATIVE Final    Comment: (NOTE) SARS-CoV-2 target nucleic acids are NOT DETECTED.  The SARS-CoV-2 RNA is generally detectable in upper respiratory specimens during the acute phase of infection. The lowest concentration of SARS-CoV-2 viral copies this assay can detect is 138 copies/mL. A negative result does not preclude SARS-Cov-2 infection and should not be used as the sole basis for treatment or other patient management decisions. A negative result may occur with  improper specimen collection/handling, submission of specimen other than nasopharyngeal swab, presence of viral mutation(s) within the areas targeted by this assay, and inadequate number of viral copies(<138 copies/mL). A negative result must be combined with clinical observations, patient history, and epidemiological information. The expected result is Negative.  Fact Sheet for Patients:  BloggerCourse.com  Fact Sheet for Healthcare Providers:  SeriousBroker.it  This test is no t yet approved or cleared by the [place] FDA and  has been authorized for detection and/or diagnosis of SARS-CoV-2 by FDA under an Emergency Use Authorization (EUA). This EUA will remain  in effect (meaning this test can be used) for the  duration of the COVID-19 declaration under Section 564(b)(1) of the Act, 21 U.S.C.section 360bbb-3(b)(1), unless the authorization is terminated  or revoked sooner.       Influenza A by PCR NEGATIVE NEGATIVE Final   Influenza B by PCR NEGATIVE NEGATIVE Final    Comment: (NOTE) The Xpert Xpress SARS-CoV-2/FLU/RSV plus assay is intended as an aid in the diagnosis of influenza from Nasopharyngeal swab specimens and should not be used as a sole basis for treatment. Nasal washings and aspirates are unacceptable for Xpert Xpress SARS-CoV-2/FLU/RSV testing.  Fact Sheet for Patients: BloggerCourse.com  Fact Sheet for Healthcare Providers: SeriousBroker.it  This test is not yet approved or cleared by the [place] FDA and has been authorized for detection and/or diagnosis of SARS-CoV-2 by FDA under an Emergency Use Authorization (EUA). This EUA will remain in effect (meaning this test can be used) for the duration of the COVID-19 declaration under Section 564(b)(1) of the Act, 21 U.S.C. section 360bbb-3(b)(1), unless the authorization is terminated or revoked.     Resp Syncytial Virus by PCR NEGATIVE NEGATIVE Final    Comment: (NOTE) Fact Sheet for Patients: BloggerCourse.com  Fact Sheet for Healthcare Providers: SeriousBroker.it  This test is not yet approved or cleared by the [place] FDA and has been authorized for detection and/or diagnosis of SARS-CoV-2 by FDA under an Emergency Use Authorization (EUA). This EUA will remain in effect (meaning this test can be used) for the duration of the COVID-19 declaration under Section 564(b)(1) of the Act, 21 U.S.C. section 360bbb-3(b)(1), unless the authorization is terminated or revoked.  Performed at [facility], [address] [place], [place] [address]       Radiology Studies: No results  found.     LOS: 6 days   Ramanda Paules [employer] on [email or web address]  09/10/2023, 10:55 AM

## 2023-09-11 DIAGNOSIS — I639 Cerebral infarction, unspecified: Secondary | ICD-10-CM | POA: Diagnosis not present

## 2023-09-11 LAB — BASIC METABOLIC PANEL
Anion gap: 8 (ref 5–15)
BUN: 22 mg/dL — ABNORMAL HIGH (ref 6–20)
CO2: 21 mmol/L — ABNORMAL LOW (ref 22–32)
Calcium: 8.8 mg/dL — ABNORMAL LOW (ref 8.9–10.3)
Chloride: 109 mmol/L (ref 98–111)
Creatinine, Ser: 1.81 mg/dL — ABNORMAL HIGH (ref 0.61–1.24)
GFR, Estimated: 45 mL/min — ABNORMAL LOW (ref >60)
Glucose, Bld: 102 mg/dL — ABNORMAL HIGH (ref 70–99)
Potassium: 4.6 mmol/L (ref 3.5–5.1)
Sodium: 138 mmol/L (ref 135–145)

## 2023-09-11 LAB — CBC
HCT: 37.5 % — ABNORMAL LOW (ref 39.0–52.0)
Hemoglobin: 11.7 g/dL — ABNORMAL LOW (ref 13.0–17.0)
MCH: 28 pg (ref 26.0–34.0)
MCHC: 31.2 g/dL (ref 30.0–36.0)
MCV: 89.7 fL (ref 80.0–100.0)
Platelets: 301 10*3/uL (ref 150–400)
RBC: 4.18 MIL/uL — ABNORMAL LOW (ref 4.22–5.81)
RDW: 15.8 % — ABNORMAL HIGH (ref 11.5–15.5)
WBC: 8.7 10*3/uL (ref 4.0–10.5)
nRBC: 0 % (ref 0.0–0.2)

## 2023-09-11 NOTE — Plan of Care (Signed)
  Problem: Coping: Goal: Will verbalize positive feelings about self Outcome: Progressing   Problem: Nutrition: Goal: Risk of aspiration will decrease Outcome: Progressing   

## 2023-09-11 NOTE — Progress Notes (Signed)
Pt had 10-beat run [employer]. MD Enedina Finner notified. STAT 12-lead EKG ordered.

## 2023-09-11 NOTE — Progress Notes (Signed)
Progress Note   Patient: Craig Schmidt XBJ:[id number] DOB: 1970-08-21 DOA: 09/03/2023     7 DOS: the patient was seen and examined on 09/11/2023   Brief hospital course: 53 yr old man who presented to [facility] ED on 09/03/2023 with complaints of loss of vision, weakness, and inability to stand. He had been admitted to [facility] 07/09/2024 for acute systolic heart failure, and 06/26/23 for multifocal pneumonia with necrotizing component s/p BAL with negative bacterial culture and AFB/fungal cultures.   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   CT head was negative, and the patient left AMA. He was unable to move his left arm at this time, and this was pointed out to the patient. Ultimately he did come back to have the MRI which confirmed a stroke.  MRI demonstrated a large 3 cm right parietal and insular embolic infarct along with 3 small punctate infarcts in the right medial occipital lobes and left frontal vertex and left inferior cerebellum. CTA of the head/neck demonstrated a right MCA branch occlusion corresponding to the infarct.   Assessment and Plan: Acute CVA (cerebrovascular accident) (HCC)/concern for embolic etiology The patient has suffered a right parietal infact with a occlusion of the right MCA branch.  Seen by neurology. Counseled regarding his polysubstance abuse. Noted to be on aspirin and Eliquis.  Noted to be on statin. LDL noted to be 77.  He underwent recent echocardiogram in December which showed LVEF of less than 20%.  HbA1c was 5.6 in November. Seen by PT and OT.  Inpatient rehab was recommended. Started on gabapentin for neuropathic pain in his feet.   Dysphagia Seen by speech therapy.  Remains on heart healthy diet.     AKI Transient. Now resolved.    Dyspnea The patient states that he feels short of breath.  Likely due to his CHF.  Saturations are normal.   Cocaine abuse (HCC) Pt has been advised that he must stop cocaine use to avoid catastrophic CVA in the future.   Essential hypertension Noted to be on HCTZ   HFrEF (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) EF less than 20%.  Patient continues to do illicit drugs especially cocaine.   Home medication list reviewed.  He is supposed to be on carvedilol, Jardiance, furosemide and Entresto along with spironolactone.   Unclear if the patient has been compliant with any of these medications.  No beta-blocker due to cocaine use.   Creatinine is elevated and so will hold off on Entresto since his compliance has been questionable.  This can be addressed in the outpatient setting. Changed over HCTZ to furosemide.  Continue with spironolactone.   Chronic kidney disease disease stage II Monitor renal function closely.  Seems to be close to baseline at this time.  Labs are pending from today.   Hyperlipidemia Continue lipitor 40 mg daily.   Hypertension, malignant Continue to monitor.  Seems to be stable.  Just on diuretics at this time.   Alcohol abuse Placed on CIWA protocol.  No evidence for withdrawal currently.  Continue thiamine multivitamins.  Subjective: Pt seen and examined at bedside. No new issues, awaiting CIR. Working w/ therapy.  Physical Exam: Vitals:   09/11/23 0308 09/11/23 0810 09/11/23 1122 09/11/23 1509  BP: 131/88 (!) 142/101 (!) 135/94 (!) 136/94  Pulse:  93 88 97 97  Resp: 14 14 14 14   Temp: 97.8 F (36.6 C) 98.4 F (36.9 C) 98.8 F (37.1 C) 98.9 F (37.2 C)  TempSrc: Oral Oral Oral Oral  SpO2: 99% 100% 97% 100%  Weight:      Height:       Physical Exam HENT:     Head: Atraumatic.     Mouth/Throat:     Mouth: Mucous membranes are moist.  Cardiovascular:     Rate and Rhythm: Normal rate and regular rhythm.  Pulmonary:     Effort:  Pulmonary effort is normal.  Abdominal:     Palpations: Abdomen is soft.  Musculoskeletal:     Cervical back: Neck supple.  Skin:    General: Skin is warm.  Neurological:     Mental Status: He is alert. Mental status is at baseline.  Psychiatric:        Mood and Affect: Mood normal.      Disposition: Status is: Inpatient Remains inpatient appropriate because: awaiting safe dispo  Planned Discharge Destination: Rehab    Time spent: 35 minutes  Author: Baron Hamper , MD 09/11/2023 5:22 PM  For on call review [email or web address].

## 2023-09-11 NOTE — Plan of Care (Signed)

## 2023-09-11 NOTE — Plan of Care (Signed)
   Problem: Coping: Goal: Will verbalize positive feelings about self Outcome: Progressing

## 2023-09-11 NOTE — Progress Notes (Signed)
Physical Therapy Treatment Patient Details Name: Craig Schmidt MRN: [id number] DOB: 1970-08-13 Today's Date: 09/11/2023   History of Present Illness 53 y.o. male presenting to ED 2/12 with L sided weakness and confusion. MRI found a right parietal infarct as well as other small punctate infarcts. 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    PT Comments  Pt resting in bed and agreeable to session with slow but steady progress towards acute goals. Pt pleasant and participatory throughout session, able to complete bed mobility with grossly CGA for safety and increased time needed to complete. Pt able to boost to stand x3 during session with min A to steady on rise without UE support. Pt needing mod-max A to maintain standing balance as pt with posterior lean and demonstrating poor weight shift and coordination when stepping anterior and around to chair. Pt agreeable to time up in chair at end of session and receptive to education on importance of continued PT to progress towards mobility goals. Current plan remains appropriate to address deficits and maximize functional independence and decrease caregiver burden. Pt continues to benefit from skilled PT services to progress toward functional mobility goals.     If plan is discharge home, recommend the following: A lot of help with walking and/or transfers;A lot of help with bathing/dressing/bathroom;Assistance with cooking/housework;Direct supervision/assist for medications management;Direct supervision/assist for financial management;Assist for transportation;Help with stairs or ramp for entrance;Supervision due to cognitive status   Can travel by private vehicle        Equipment Recommendations  Rollator (4 wheels);BSC/3in1    Recommendations for Other Services       Precautions / Restrictions  Precautions Precautions: Fall Precaution/Restrictions Comments: reports ocassional falling Restrictions Weight Bearing Restrictions Per Provider Order: No     Mobility  Bed Mobility Overal bed mobility: Needs Assistance Bed Mobility: Supine to Sit, Sit to Supine     Supine to sit: Contact guard     General bed mobility comments: CGA with increased time, some posteior bias with LE exercises    Transfers Overall transfer level: Needs assistance Equipment used: Rollator (4 wheels), 1 person hand held assist Transfers: Bed to chair/wheelchair/BSC, Sit to/from Stand Sit to Stand: Min assist   Step pivot transfers: Mod assist, Max assist       General transfer comment: min A to boost to stand x3 during session with pt giving good effort through LEs, posterior bias in standing needing mod A to maintain balance and max A to steady with stepping anterior and around to chair, pt wanting to attempt with rolloator but unable to [employer] grasp with either hand    Ambulation/Gait Ambulation/Gait assistance: Max assist Gait Distance (Feet): 3 Feet Assistive device: 1 person hand held assist Gait Pattern/deviations: Step-to pattern, Decreased stride length, Ataxic       General Gait Details: pt able to take a few steps away from EOB with max A to maintain balance due to posterior bias   Stairs             Wheelchair Mobility     Tilt Bed    Modified Rankin (Stroke Patients Only)       Balance Overall balance assessment: Needs assistance Sitting-balance support: Feet supported, No upper extremity supported Sitting balance-Leahy Scale: Fair   Postural control: Posterior lean Standing balance support: During functional activity, Single extremity supported Standing balance-Leahy Scale: Zero  Standing balance comment: reliant on ext support                            Communication Communication Communication: Impaired Factors Affecting Communication:  Reduced clarity of speech;Difficulty expressing self  Cognition Arousal: Alert Behavior During Therapy: WFL for tasks assessed/performed   PT - Cognitive impairments: Sequencing, Problem solving, Awareness, Initiation, Attention, Safety/Judgement                       PT - Cognition Comments: pleasant and particapatory, some frustration with increased difficutly with mobility but willing to attempt Following commands: Impaired Following commands impaired: Follows one step commands with increased time    Cueing Cueing Techniques: Verbal cues, Gestural cues, Tactile cues, Visual cues  Exercises Other Exercises Other Exercises: warm up LE exercises in sitting, LAQ and seated marching x10 ea side    General Comments        Pertinent Vitals/Pain Pain Assessment Pain Assessment: No/denies pain Pain Intervention(s): Monitored during session    Home Living                          Prior Function            PT Goals (current goals can now be found in the care plan section) Acute Rehab PT Goals PT Goal Formulation: With patient/family Time For Goal Achievement: 09/18/23 Progress towards PT goals: Progressing toward goals    Frequency    Min 1X/week      PT Plan      Co-evaluation              AM-PAC PT "6 Clicks" Mobility   Outcome Measure  Help needed turning from your back to your side while in a flat bed without using bedrails?: None Help needed moving from lying on your back to sitting on the side of a flat bed without using bedrails?: A Little Help needed moving to and from a bed to a chair (including a wheelchair)?: A Lot Help needed standing up from a chair using your arms (e.g., wheelchair or bedside chair)?: A Lot Help needed to walk in hospital room?: Total Help needed climbing 3-5 steps with a railing? : Total 6 Click Score: 13    End of Session Equipment Utilized During Treatment: Gait belt Activity Tolerance: Patient tolerated  treatment well Patient left: with call bell/phone within reach;in chair;with family/visitor present Nurse Communication: Mobility status PT Visit Diagnosis: Unsteadiness on feet (R26.81);Other abnormalities of gait and mobility (R26.89);History of falling (Z91.81);Muscle weakness (generalized) (M62.81);Difficulty in walking, not elsewhere classified (R26.2);Other symptoms and signs involving the nervous system (R29.898);Hemiplegia and hemiparesis Hemiplegia - Right/Left: Left Hemiplegia - dominant/non-dominant: Non-dominant Hemiplegia - caused by: Cerebral infarction     Time: 1209-1233 PT Time Calculation (min) (ACUTE ONLY): 24 min  Charges:    $Gait Training: 8-22 mins $Therapeutic Activity: 8-22 mins PT General Charges $$ ACUTE PT VISIT: 1 Visit                     Venecia Mehl R. PTA Acute Rehabilitation Services Office: [phone]   Catalina Antigua 09/11/2023, 12:50 PM

## 2023-09-12 ENCOUNTER — Telehealth (HOSPITAL_COMMUNITY): Payer: Self-pay | Admitting: Pharmacy Technician

## 2023-09-12 ENCOUNTER — Other Ambulatory Visit (HOSPITAL_COMMUNITY): Payer: Self-pay

## 2023-09-12 DIAGNOSIS — I639 Cerebral infarction, unspecified: Secondary | ICD-10-CM | POA: Diagnosis not present

## 2023-09-12 LAB — BASIC METABOLIC PANEL
Anion gap: 8 (ref 5–15)
BUN: 20 mg/dL (ref 6–20)
CO2: 24 mmol/L (ref 22–32)
Calcium: 9 mg/dL (ref 8.9–10.3)
Chloride: 103 mmol/L (ref 98–111)
Creatinine, Ser: 1.54 mg/dL — ABNORMAL HIGH (ref 0.61–1.24)
GFR, Estimated: 54 mL/min — ABNORMAL LOW (ref >60)
Glucose, Bld: 104 mg/dL — ABNORMAL HIGH (ref 70–99)
Potassium: 4.5 mmol/L (ref 3.5–5.1)
Sodium: 135 mmol/L (ref 135–145)

## 2023-09-12 LAB — MAGNESIUM: Magnesium: 2 mg/dL (ref 1.7–2.4)

## 2023-09-12 MED ORDER — CARVEDILOL 3.125 MG PO TABS
3.1250 mg | ORAL_TABLET | Freq: Two times a day (BID) | ORAL | Status: DC
  Filled 2023-09-12: qty 1

## 2023-09-12 MED ORDER — SACUBITRIL-VALSARTAN 24-26 MG PO TABS
1.0000 | ORAL_TABLET | Freq: Two times a day (BID) | ORAL | Status: DC
  Administered 2023-09-12 – 2023-09-13 (×3): 1 via ORAL
  Filled 2023-09-12 (×3): qty 1

## 2023-09-12 NOTE — Plan of Care (Signed)
Problem: Education: Goal: Knowledge of disease or condition will improve 09/12/2023 1838 by Juluis Mire, RN Outcome: Progressing 09/12/2023 1838 by Juluis Mire, RN Outcome: Progressing Goal: Knowledge of secondary prevention will improve (MUST DOCUMENT ALL) 09/12/2023 1838 by Juluis Mire, RN Outcome: Progressing 09/12/2023 1838 by Juluis Mire, RN Outcome: Progressing Goal: Knowledge of patient specific risk factors will improve (DELETE if not current risk factor) 09/12/2023 1838 by Juluis Mire, RN Outcome: Progressing 09/12/2023 1838 by Juluis Mire, RN Outcome: Progressing   Problem: Ischemic Stroke/TIA Tissue Perfusion: Goal: Complications of ischemic stroke/TIA will be minimized 09/12/2023 1838 by Juluis Mire, RN Outcome: Progressing 09/12/2023 1838 by Juluis Mire, RN Outcome: Progressing   Problem: Coping: Goal: Will verbalize positive feelings about self 09/12/2023 1838 by Juluis Mire, RN Outcome: Progressing 09/12/2023 1838 by Juluis Mire, RN Outcome: Progressing Goal: Will identify appropriate support needs 09/12/2023 1838 by Juluis Mire, RN Outcome: Progressing 09/12/2023 1838 by Juluis Mire, RN Outcome: Progressing   Problem: Health Behavior/Discharge Planning: Goal: Ability to manage health-related needs will improve 09/12/2023 1838 by Juluis Mire, RN Outcome: Progressing 09/12/2023 1838 by Juluis Mire, RN Outcome: Progressing Goal: Goals will be collaboratively established with patient/family 09/12/2023 1838 by Juluis Mire, RN Outcome: Progressing 09/12/2023 1838 by Juluis Mire, RN Outcome: Progressing   Problem: Self-Care: Goal: Ability to participate in self-care as condition permits will improve 09/12/2023 1838 by Juluis Mire, RN Outcome: Progressing 09/12/2023 1838 by Juluis Mire, RN Outcome: Progressing Goal: Verbalization  of feelings and concerns over difficulty with self-care will improve 09/12/2023 1838 by Juluis Mire, RN Outcome: Progressing 09/12/2023 1838 by Juluis Mire, RN Outcome: Progressing Goal: Ability to communicate needs accurately will improve 09/12/2023 1838 by Juluis Mire, RN Outcome: Progressing 09/12/2023 1838 by Juluis Mire, RN Outcome: Progressing   Problem: Nutrition: Goal: Risk of aspiration will decrease 09/12/2023 1838 by Juluis Mire, RN Outcome: Progressing 09/12/2023 1838 by Juluis Mire, RN Outcome: Progressing Goal: Dietary intake will improve 09/12/2023 1838 by Juluis Mire, RN Outcome: Progressing 09/12/2023 1838 by Juluis Mire, RN Outcome: Progressing   Problem: Education: Goal: Knowledge of General Education information will improve Description: Including pain rating scale, medication(s)/side effects and non-pharmacologic comfort measures 09/12/2023 1838 by Juluis Mire, RN Outcome: Progressing 09/12/2023 1838 by Juluis Mire, RN Outcome: Progressing   Problem: Health Behavior/Discharge Planning: Goal: Ability to manage health-related needs will improve 09/12/2023 1838 by Juluis Mire, RN Outcome: Progressing 09/12/2023 1838 by Juluis Mire, RN Outcome: Progressing   Problem: Clinical Measurements: Goal: Ability to maintain clinical measurements within normal limits will improve 09/12/2023 1838 by Juluis Mire, RN Outcome: Progressing 09/12/2023 1838 by Juluis Mire, RN Outcome: Progressing Goal: Will remain free from infection 09/12/2023 1838 by Juluis Mire, RN Outcome: Progressing 09/12/2023 1838 by Juluis Mire, RN Outcome: Progressing Goal: Diagnostic test results will improve 09/12/2023 1838 by Juluis Mire, RN Outcome: Progressing 09/12/2023 1838 by Juluis Mire, RN Outcome: Progressing Goal: Respiratory complications will  improve 09/12/2023 1838 by Juluis Mire, RN Outcome: Progressing 09/12/2023 1838 by Juluis Mire, RN Outcome: Progressing Goal: Cardiovascular complication will be avoided 09/12/2023 1838 by Juluis Mire, RN Outcome: Progressing 09/12/2023 1838 by Juluis Mire, RN Outcome: Progressing   Problem: Activity: Goal: Risk for activity intolerance will decrease 09/12/2023 1838 by Juluis Mire, RN Outcome: Progressing 09/12/2023 1838 by  Juluis Mire, RN Outcome: Progressing   Problem: Nutrition: Goal: Adequate nutrition will be maintained Outcome: Progressing   Problem: Coping: Goal: Level of anxiety will decrease Outcome: Progressing   Problem: Elimination: Goal: Will not experience complications related to bowel motility Outcome: Progressing Goal: Will not experience complications related to urinary retention Outcome: Progressing   Problem: Pain Managment: Goal: General experience of comfort will improve and/or be controlled Outcome: Progressing   Problem: Safety: Goal: Ability to remain free from injury will improve Outcome: Progressing   Problem: Skin Integrity: Goal: Risk for impaired skin integrity will decrease Outcome: Progressing

## 2023-09-12 NOTE — Progress Notes (Signed)
Speech Language Pathology Treatment: Cognitive-Linquistic  Patient Details Name: Craig Schmidt MRN: [id number] DOB: 1971-05-07 Today's Date: 09/12/2023 Time: 4782-9562 SLP Time Calculation (min) (ACUTE ONLY): 18 min  Assessment / Plan / Recommendation Clinical Impression  Pt participated in therapy focusing on goals of fluency and left visual attention. Last session therapist introduced pt to pitch variation strategy to use when speech became dysfluent however this has significantly improved and he is not experiencing dysfluency currently. In conversation with with therapist his speech was fluent and pt/girlfriend report he had not had recent episodes of stuttering. Therapist donned pt's taped glasses and he read a menu. He did not have difficulty seeing information on the left of the page but more so stated words were blurry. He was able to read medium headings on menu and some of the food items but did have significant difficulty with visual acuity. OT provided him with the taped glasses and are working with him on his vision. He did state the glasses help decrease his diplopia. From a speech standpoint he has met his goals for the acute setting. Plan is for possible admission to inpatient CIR where he can be assessed at a higher level of care to determine if there are needs. ST will sign off at this time. Pt and girlfriend are in agreement.    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  Patiently recently admitted 12/18-12/23 with acute systolic heart failure; previous admission 12/4-12/12 with multifocal pneumonia with necrotizing component, s/p BAL (12/11) with negative bacterial culture and AFB/fungal cultures. Admitted with  visual deficits, weakness, incoherent speech. CT negative, pt left AMA however returned and MRI 3 cm region of acute cortical and subcortical infarction on the right affecting the deep insula and parietal lobe consistent with embolic infarction in the right MCA territory. 2 or 3 punctate acute infarctions in the right medial occipital cortex. Few clustered punctate acute infarctions in the left frontal vertex region. Probable punctate acute infarction in the inferior left cerebellum; old bilateral frontal and parietal cortical and subcortical infarctions. Brief SLE performed 05/2023 due to pain level and pt lability; partial assessment showed dysarthria and cognitive impairments in attention. Recommendation for further diagnostic evaluation in treatment.      SLP Plan  Discharge SLP treatment due to (comment)      Recommendations for follow up therapy are one component of a multi-disciplinary discharge planning process, led by the attending physician.  Recommendations may be updated based on patient status, additional functional criteria and insurance authorization.    Recommendations                     Oral care BID   Intermittent Supervision/Assistance       Discharge SLP treatment due to (comment)     Royce Macadamia  09/12/2023, 2:36 PM

## 2023-09-12 NOTE — TOC Progression Note (Signed)
Transition of Care [facility]) - Progression Note    Patient Details  Name: Craig Schmidt MRN: [id number] Date of Birth: [birth year]  Transition of Care [facility]) CM/SW Contact  Baldemar Lenis, [place] Phone Number: 09/12/2023, 11:36 AM  Clinical Narrative:   CSW following for disposition. Patient still with no bed offers for SNF. Noting patient with improved participation with PT yesterday. CSW coordinated with CIR Admissions, they want to see sustained participation before offering a bed. CSW to follow.    Expected Discharge Plan: IP Rehab Facility Barriers to Discharge: Continued Medical Work up, English as a second language teacher, Inadequate or no insurance, Active Substance Use - Placement  Expected Discharge Plan and Services       Living arrangements for the past 2 months: Apartment                                       Social Determinants of Health (SDOH) Interventions SDOH Screenings   Food Insecurity: Food Insecurity Present (09/04/2023)  Housing: High Risk (09/04/2023)  Transportation Needs: Unmet Transportation Needs (09/04/2023)  Utilities: Not At Risk (09/04/2023)  Recent Concern: Utilities - At Risk (08/12/2023)  Alcohol Screen: High Risk (09/19/2021)  Depression (PHQ2-9): High Risk (06/13/2022)  Financial Resource Strain: High Risk (07/19/2023)  Stress: Stress Concern Present (10/16/2022)  Tobacco Use: High Risk (09/04/2023)    Readmission Risk Interventions    07/12/2023    1:06 PM 06/28/2023    1:27 PM 06/21/2023   12:50 PM  Readmission Risk Prevention Plan  Transportation Screening Complete Complete Complete  PCP or Specialist Appt within 3-5 Days  Complete   HRI or Home Care Consult  Complete   Social Work Consult for Recovery Care Planning/Counseling  Complete   Palliative Care Screening  Not Applicable   Medication Review Oceanographer) Complete Complete Complete  PCP or Specialist appointment within 3-5 days of discharge Complete  Complete  HRI or Home  Care Consult Complete  Complete  SW Recovery Care/Counseling Consult Complete  Complete  Palliative Care Screening Not Applicable  Not Applicable  Skilled Nursing Facility Not Applicable  Not Applicable

## 2023-09-12 NOTE — Progress Notes (Signed)
Progress Note   Patient: Craig Schmidt WUJ:[id number] DOB: 05-Mar-1971 DOA: 09/03/2023     8 DOS: the patient was seen and examined on 09/12/2023   Brief hospital course: 53 yr old man who presented to [facility] ED on 09/03/2023 with complaints of loss of vision, weakness, and inability to stand. He had been admitted to [facility] 07/09/2024 for acute systolic heart failure, and 06/26/23 for multifocal pneumonia with necrotizing component s/p BAL with negative bacterial culture and AFB/fungal cultures.   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   CT head was negative, and the patient left AMA. He was unable to move his left arm at this time, and this was pointed out to the patient. Ultimately he did come back to have the MRI which confirmed a stroke.  MRI demonstrated a large 3 cm right parietal and insular embolic infarct along with 3 small punctate infarcts in the right medial occipital lobes and left frontal vertex and left inferior cerebellum. CTA of the head/neck demonstrated a right MCA branch occlusion corresponding to the infarct.   Assessment and Plan: Acute CVA (cerebrovascular accident) (HCC)/concern for embolic etiology The patient has suffered a right parietal infact with a occlusion of the right MCA branch.  Seen by neurology. Counseled regarding his polysubstance abuse. Noted to be on aspirin and Eliquis.  Noted to be on statin. LDL noted to be 77.  He underwent recent echocardiogram in December which showed LVEF of less than 20%.  HbA1c was 5.6 in November. Seen by PT and OT.  Inpatient rehab was recommended. Started on gabapentin for neuropathic pain in his feet.   Dysphagia Seen by speech therapy.  Remains on heart healthy diet.     AKI Transient. Now resolved.    Dyspnea The patient states that he feels short of breath.  Likely due to his CHF.  Saturations are normal.   Cocaine abuse (HCC) Pt has been advised that he must stop cocaine use to avoid catastrophic CVA in the future.   Essential hypertension Noted to be on HCTZ   HFrEF (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) EF less than 20%.  Patient continues to do illicit drugs especially cocaine.   Home medication list reviewed.  He is supposed to be on carvedilol, Jardiance, furosemide and Entresto along with spironolactone.   Unclear if the patient has been compliant with any of these medications.  No beta-blocker due to cocaine use.   Creatinine is elevated and so will hold off on Entresto since his compliance has been questionable.  This can be addressed in the outpatient setting. Changed over HCTZ to furosemide.  Continue with spironolactone.   Chronic kidney disease disease stage II Monitor renal function closely.  Seems to be close to baseline at this time.  Labs are pending from today.   Hyperlipidemia Continue lipitor 40 mg daily.   Hypertension, malignant Continue to monitor.  Seems to be stable.  Just on diuretics at this time.   Alcohol abuse Placed on CIWA protocol.  No evidence for withdrawal currently.  Continue thiamine multivitamins.   Subjective: Pt seen and examined at the bedside. Per SW notes the pt does not have any bed offers at SNF yet.  Moreover, --> CSW coordinated with CIR Admissions, they want to see sustained participation before offering a  bed.   Physical Exam: Vitals:   09/12/23 0022 09/12/23 0415 09/12/23 0800 09/12/23 1137  BP: (!) 124/91 (!) 127/91 (!) 134/108 123/87  Pulse: 96 91 97 91  Resp: 18 18 18 18   Temp: (!) 97.2 F (36.2 C) (!) 97.4 F (36.3 C) 97.6 F (36.4 C) 98.2 F (36.8 C)  TempSrc: Oral Oral Oral Oral  SpO2: 100% 100% 97% 100%  Weight:      Height:       HENT:     Head: Atraumatic.     Mouth/Throat:     Mouth:  Mucous membranes are moist.  Cardiovascular:     Rate and Rhythm: Normal rate and regular rhythm.  Pulmonary:     Effort: Pulmonary effort is normal.  Abdominal:     Palpations: Abdomen is soft.  Musculoskeletal:     Cervical back: Neck supple.  Skin:    General: Skin is warm.  Neurological:     Mental Status: He is alert. Mental status is at baseline.  Psychiatric:        Mood and Affect: Mood normal.      Disposition: Status is: Inpatient Remains inpatient appropriate because: awaiting CIR   Planned Discharge Destination: Rehab    Time spent: 35 minutes  Author: Baron Hamper , MD 09/12/2023 12:28 PM  For on call review [email or web address].

## 2023-09-12 NOTE — Progress Notes (Signed)
Occupational Therapy Treatment Patient Details Name: Craig Schmidt MRN: [id number] DOB: [birth year] Today's Date: 09/12/2023   History of present illness 53 y.o. male presenting to ED 2/12 with L sided weakness and confusion. MRI found a right parietal infarct as well as other small punctate infarcts. 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   OT comments  Excellent participation during OT. Able to ambulate over 100 ft with min A using HHA holding L dominant hand. Pt implementing cues taught to improve posture during ambulation, demonstrating increased on-line awareness. 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 Pt able to stand at sink to complete grooming tasks using hand over hand for movement patterns and built up foam grip. Continue to recommend intensive inpatient follow-up therapy, >3 hours/day to facilitate safe DC home to his apartment with assistance for ADL from Craig Schmidt, his girlfriend. Acute OT to follow. Communicated with CM from [facility] regarding recommendation for rehab.       If plan is discharge home, recommend the following:  A lot of help with walking and/or transfers;A lot of help with bathing/dressing/bathroom;Assistance with cooking/housework;Direct supervision/assist for medications management;Assist for transportation;Two people to help with walking and/or transfers;Two people to help with bathing/dressing/bathroom;Direct supervision/assist for financial management;Help with stairs or ramp for entrance   Equipment Recommendations  None recommended by OT    Recommendations for Other Services      Precautions / Restrictions Precautions Precautions: Fall Precaution/Restrictions Comments: apraxic LUE       Mobility Bed Mobility Overal bed mobility: Needs Assistance Bed Mobility: Supine to Sit, Sit to Supine     Supine to sit:  Contact guard Sit to supine: Min assist        Transfers Overall transfer level: Needs assistance   Transfers: Sit to/from Stand Sit to Stand: Min assist                 Balance     Sitting balance-Leahy Scale: Fair       Standing balance-Leahy Scale: Poor                             ADL either performed or assessed with clinical judgement   ADL   Eating/Feeding: Maximal assistance   Grooming: Maximal assistance Grooming Details (indicate cue type and reason): hand over hand due to limb apraxia Upper Body Bathing: Maximal assistance               Toilet Transfer: Minimal assistance;Ambulation           Functional mobility during ADLs: Minimal assistance      Extremity/Trunk Assessment Upper Extremity Assessment Upper Extremity Assessment: LUE deficits/detail LUE:  (ataxic; does well with tactile and verbal cues; best with slow movements and withhim using visual feedback)            Vision   Vision Assessment?: Vision impaired- to be further tested in functional context Additional Comments: does better with glasses   Perception Perception Perception: Impaired Preception Impairment Details: Inattention/Neglect   Praxis Praxis Praxis: Impaired Praxis Impairment Details: Limb apraxia   Communication     Cognition Arousal: Alert Behavior During Therapy: WFL for tasks assessed/performed Cognition: Cognition impaired     Awareness: Intellectual awareness intact, Online awareness impaired   Attention impairment (select first level of impairment):  Selective attention Executive functioning impairment (select all impairments): Initiation, Organization, Sequencing                   Following commands: Impaired Following commands impaired: Follows one step commands with increased time      Cueing   Cueing Techniques: Gestural cues, Verbal cues, Tactile cues  Exercises      Shoulder Instructions       General  Comments      Pertinent Vitals/ Pain       Pain Assessment Pain Assessment: No/denies pain  Home Living                                          Prior Functioning/Environment              Frequency  Min 1X/week        Progress Toward Goals  OT Goals(current goals can now be found in the care plan section)  Progress towards OT goals: Progressing toward goals  Acute Rehab OT Goals Patient Stated Goal: to get better OT Goal Formulation: With patient/family Time For Goal Achievement: 09/18/23 Potential to Achieve Goals: Good ADL Goals Pt Will Perform Eating: with set-up;sitting;with adaptive utensils Pt Will Perform Grooming: sitting;with adaptive equipment;with min assist Pt Will Perform Upper Body Bathing: sitting;with min assist;with adaptive equipment Pt Will Transfer to Toilet: ambulating;with contact guard assist Pt/caregiver will Perform Home Exercise Program: Increased strength;Increased ROM;Left upper extremity;With written HEP provided Additional ADL Goal #2: Pt will utilize compensatory strategies to locate 3/3 objects on his L side for ADLs  Plan      Co-evaluation                 AM-PAC OT "6 Clicks" Daily Activity     Outcome Measure   Help from another person eating meals?: A Lot Help from another person taking care of personal grooming?: A Lot Help from another person toileting, which includes using toliet, bedpan, or urinal?: Total Help from another person bathing (including washing, rinsing, drying)?: A Lot Help from another person to put on and taking off regular upper body clothing?: Total Help from another person to put on and taking off regular lower body clothing?: Total 6 Click Score: 9    End of Session Equipment Utilized During Treatment: Gait belt  OT Visit Diagnosis: Muscle weakness (generalized) (M62.81);Unsteadiness on feet (R26.81);Other abnormalities of gait and mobility (R26.89);Hemiplegia and  hemiparesis;Cognitive communication deficit (R41.841) Symptoms and signs involving cognitive functions: Cerebral infarction 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

## 2023-09-12 NOTE — Telephone Encounter (Signed)
Pharmacy Patient Advocate Encounter   Received notification that prior authorization for Jardiance 10MG  tablets is required/requested.   Insurance verification completed.   The patient is insured through [facility] .   Per test claim: PA required; PA submitted to above mentioned insurance via CoverMyMeds Key/confirmation #/EOC [id number] Status is pending

## 2023-09-12 NOTE — Plan of Care (Signed)
  Problem: Health Behavior/Discharge Planning: Goal: Ability to manage health-related needs will improve Outcome: Progressing Goal: Goals will be collaboratively established with patient/family Outcome: Progressing   Problem: Self-Care: Goal: Ability to participate in self-care as condition permits will improve Outcome: Progressing Goal: Verbalization of feelings and concerns over difficulty with self-care will improve Outcome: Progressing Goal: Ability to communicate needs accurately will improve Outcome: Progressing   Problem: Activity: Goal: Risk for activity intolerance will decrease Outcome: Progressing   Problem: Nutrition: Goal: Adequate nutrition will be maintained Outcome: Progressing   Problem: Elimination: Goal: Will not experience complications related to bowel motility Outcome: Progressing Goal: Will not experience complications related to urinary retention Outcome: Progressing   Problem: Safety: Goal: Ability to remain free from injury will improve Outcome: Progressing   Problem: Skin Integrity: Goal: Risk for impaired skin integrity will decrease Outcome: Progressing

## 2023-09-12 NOTE — Plan of Care (Signed)

## 2023-09-12 NOTE — Telephone Encounter (Signed)
Patient Product/process development scientist completed.    The patient is insured through [place].     Ran test claim for Entresto 24-26 mg and the current 30 day co-pay is $4.00.  Ran test claim for Farxiga 10 mg and Requires Prior Authorization  Ran test claim for Jardiance 10 mg and Requires Prior Authorization  This test claim was processed through [employer]- copay amounts may vary at other pharmacies due to [employer], or as the patient moves through the different stages of their insurance plan.     Roland Earl, CPHT Pharmacy Technician III Certified Patient Advocate [facility] Pharmacy Patient Advocate Team Direct Number: [phone]  Fax: [phone]

## 2023-09-13 ENCOUNTER — Other Ambulatory Visit (HOSPITAL_COMMUNITY): Payer: Self-pay

## 2023-09-13 ENCOUNTER — Inpatient Hospital Stay (HOSPITAL_COMMUNITY): Payer: MEDICAID

## 2023-09-13 DIAGNOSIS — I639 Cerebral infarction, unspecified: Secondary | ICD-10-CM | POA: Diagnosis not present

## 2023-09-13 DIAGNOSIS — R569 Unspecified convulsions: Secondary | ICD-10-CM | POA: Diagnosis not present

## 2023-09-13 LAB — BASIC METABOLIC PANEL
Anion gap: 13 (ref 5–15)
BUN: 26 mg/dL — ABNORMAL HIGH (ref 6–20)
CO2: 22 mmol/L (ref 22–32)
Calcium: 9.2 mg/dL (ref 8.9–10.3)
Chloride: 100 mmol/L (ref 98–111)
Creatinine, Ser: 1.48 mg/dL — ABNORMAL HIGH (ref 0.61–1.24)
GFR, Estimated: 57 mL/min — ABNORMAL LOW (ref >60)
Glucose, Bld: 107 mg/dL — ABNORMAL HIGH (ref 70–99)
Potassium: 4.4 mmol/L (ref 3.5–5.1)
Sodium: 135 mmol/L (ref 135–145)

## 2023-09-13 LAB — RAPID URINE DRUG SCREEN, HOSP PERFORMED
Amphetamines: NOT DETECTED
Barbiturates: NOT DETECTED
Benzodiazepines: NOT DETECTED
Cocaine: NOT DETECTED
Opiates: NOT DETECTED
Tetrahydrocannabinol: NOT DETECTED

## 2023-09-13 MED ORDER — LORAZEPAM 2 MG/ML IJ SOLN
INTRAMUSCULAR | Status: AC
  Administered 2023-09-13: 1 mg via INTRAVENOUS
  Filled 2023-09-13: qty 1

## 2023-09-13 MED ORDER — SODIUM CHLORIDE 0.9 % IV SOLN: INTRAVENOUS | Status: DC

## 2023-09-13 MED ORDER — LORAZEPAM 2 MG/ML IJ SOLN: 1.0000 mg | Freq: Once | INTRAMUSCULAR | Status: AC

## 2023-09-13 MED ORDER — NALOXONE HCL 0.4 MG/ML IJ SOLN
INTRAMUSCULAR | Status: AC
  Filled 2023-09-13: qty 1

## 2023-09-13 MED ORDER — SODIUM CHLORIDE 0.9 % IV SOLN: INTRAVENOUS | Status: AC

## 2023-09-13 NOTE — Significant Event (Addendum)
Rapid Response Event Note   Reason for Call :  Brief loss of consciousness while up to bedside commode.   Initial Focused Assessment:  Pt lying in bed. Drowsy, oriented. He tells me he started feeling hot and then staff states his eyes rolled back and head dropped forward. Staff placed pt back in bed.   Skin is warm, moist, pink. Clear breath sounds, tachypneic. Pt states he feels short of breath. Heart rate regular, peripheral pulses 2+. Pupils 2mm, sluggish. Moves all extremities equally. Mild dysarthria.   VS: T 97.83F, BP 77/59 (66), HR 85, RR 22, SpO2 100% on room air CBG: 107  Interventions:  -1mg  IV Ativan for concern for seizure activity, per Neurology  -IV fluid 129mL/hr per primary MD -CBG -UDS -Orthostatic VS  Plan of Care:  -Orthostatic VS -Telesitter  Event Summary:  MD Notified: Dr. Nelda Severe Call Time: 1133 Arrival Time: 1135 End Time: 1215  Jennye Moccasin, RN

## 2023-09-13 NOTE — Discharge Instructions (Addendum)
 Intensive Outpatient Programs  [facility]    [facility] [address]     [address] [place],  [place]     [place], [place] [phone]      [phone]  [facility]   [facility]  (Inpatient and outpatient)  [phone] (Suboxone and Methadone) [address]           [phone]          [facility]    [facility] - Intensive Outpatient [address]     [address] [place], [place] [address]     [place], [place]  [phone]      [phone]  [facility] (Outpatient, Inpatient, Chemical  [facility] (Groups and Residental) (insurance only) [phone]    [place], [place]          [phone]       [facility]    [facility] (for caregivers and family) [address]     [address] [place], [place]     [place], [place] [phone]      [phone]  Residential Treatment Programs  [facility]  [place](2 years) Residential: 90 days)  [facility] ([facility] [address] [facility]      [address] [place], [place]     [place], [place] [phone]      [phone] or [phone]  [facility]    [facility] [address]      [address] [place], [place]     [place], [place] [phone]      [phone]  [facility] Residential [facility] Facility   [facility] ([facility]) [address]     [address] [place], [place] [address]     [place], [place] [phone]      [phone] Admissions: 8am-3pm M-F  [facility]: Residential Program [phone] Days)              [facility]: [facility]  [place], [place]     [place], [place]  [phone] or [phone]    (Walk in Hours over the weekend or by referral)   Mobil Crisis: [facility]:[phone] (for crisis response 24  hours a day)  Information on my medicine - ELIQUIS (apixaban)  This medication education was reviewed with me or my healthcare representative as part of my discharge preparation.    Why was Eliquis prescribed for you? Eliquis was prescribed for you to reduce the risk of a blood clot forming that can cause a stroke if you have a medical condition called atrial fibrillation (a type of irregular heartbeat).  What do You need to know about Eliquis ? Take your Eliquis TWICE DAILY - one tablet in the morning and one tablet in the evening with or without food. If you have difficulty swallowing the tablet whole please discuss with your pharmacist how to take the medication safely.  Take Eliquis exactly as prescribed by your doctor and DO NOT stop taking Eliquis without talking to the doctor who prescribed the medication.  Stopping may increase your risk of developing a stroke.  Refill your prescription before you run out.  After discharge, you should have regular check-up appointments with your healthcare provider that is prescribing your Eliquis.  In the future your dose may need to be changed if your kidney function or  weight changes by a significant amount or as you get older.  What do you do if you miss a dose? If you miss a dose, take it as soon as you remember on the same day and resume taking twice daily.  Do not take more than one dose of ELIQUIS at the same time to make up a missed dose.  Important Safety Information A possible side effect of Eliquis is bleeding. You should call your healthcare provider right away if you experience any of the following: Bleeding from an injury or your nose that does not stop. Unusual colored urine (red or dark brown) or unusual colored stools (red or black). Unusual bruising for unknown reasons. A serious fall or if you hit your head (even if there is no bleeding).  Some medicines may interact with Eliquis and might increase your risk of bleeding  or clotting while on Eliquis. To help avoid this, consult your healthcare provider or pharmacist prior to using any new prescription or non-prescription medications, including herbals, vitamins, non-steroidal anti-inflammatory drugs (NSAIDs) and supplements.  This website has more information on Eliquis (apixaban): http://www.eliquis.com/eliquis/home

## 2023-09-13 NOTE — Procedures (Addendum)
 Patient Name: Craig Schmidt  MRN: [id number]  Epilepsy Attending: Charlsie Quest  Referring Physician/Provider: Elmer Picker, NP  Date: 09/13/2023 Duration: 27.41 mins  Patient history: 53yo M with seizure like activity. EEG to evaluate for seizure  Level of alertness: Awake, asleep  AEDs during EEG study: None  Technical aspects: This EEG study was done with scalp electrodes positioned according to the 10-20 International system of electrode placement. Electrical activity was reviewed with band pass filter of 1-70Hz , sensitivity of 7 uV/mm, display speed of 40mm/sec with a 60Hz  notched filter applied as appropriate. EEG data were recorded continuously and digitally stored.  Video monitoring was available and reviewed as appropriate.  Description: The posterior dominant rhythm consists of 9 Hz activity of moderate voltage (25-35 uV) seen predominantly in posterior head regions, symmetric and reactive to eye opening and eye closing. Sleep was characterized by vertex waves, sleep spindles (12 to 14 Hz), maximal frontocentral region.  Hyperventilation and photic stimulation were not performed.     EKG artifact was seen during the study  IMPRESSION: This study is within normal limits. No seizures or epileptiform discharges were seen throughout the recording.  A normal interictal EEG does not exclude the diagnosis of epilepsy.   [name]

## 2023-09-13 NOTE — TOC Progression Note (Signed)
Transition of Care [facility]) - Progression Note    Patient Details  Name: BRINTON BRANDEL MRN: [id number] Date of Birth: 31-May-1971  Transition of [facility]) CM/SW Contact  Lockie Pares, RN Phone Number: 09/13/2023, 10:10 AM  Clinical Narrative:     The patient has participated in therapy sessions and insurance authorization is starting for CIR.   Expected Discharge Plan: IP Rehab Facility Barriers to Discharge: Insurance Authorization  Expected Discharge Plan and Services       Living arrangements for the past 2 months: Apartment                                       Social Determinants of Health (SDOH) Interventions SDOH Screenings   Food Insecurity: Food Insecurity Present (09/04/2023)  Housing: High Risk (09/04/2023)  Transportation Needs: Unmet Transportation Needs (09/04/2023)  Utilities: Not At Risk (09/04/2023)  Recent Concern: Utilities - At Risk (08/12/2023)  Alcohol Screen: High Risk (09/19/2021)  Depression (PHQ2-9): High Risk (06/13/2022)  Financial Resource Strain: High Risk (07/19/2023)  Stress: Stress Concern Present (10/16/2022)  Tobacco Use: High Risk (09/04/2023)    Readmission Risk Interventions    07/12/2023    1:06 PM 06/28/2023    1:27 PM 06/21/2023   12:50 PM  Readmission Risk Prevention Plan  Transportation Screening Complete Complete Complete  PCP or Specialist Appt within 3-5 Days  Complete   HRI or Home Care Consult  Complete   Social Work Consult for Recovery Care Planning/Counseling  Complete   Palliative Care Screening  Not Applicable   Medication Review Oceanographer) Complete Complete Complete  PCP or Specialist appointment within 3-5 days of discharge Complete  Complete  HRI or Home Care Consult Complete  Complete  SW Recovery Care/Counseling Consult Complete  Complete  Palliative Care Screening Not Applicable  Not Applicable  Skilled Nursing Facility Not Applicable  Not Applicable

## 2023-09-13 NOTE — Progress Notes (Signed)
PT Cancellation Note  Patient Details Name: BRODAN GREWELL MRN: [id number] DOB: 1971-06-08   Cancelled Treatment:    Reason Eval/Treat Not Completed: (P) Medical issues which prohibited therapy, pt with rapid response event per chart review, will hold therapies this date and check back as schedule allows and pt appropriate to continue with PT POC.   Lenora Boys. PTA Acute Rehabilitation Services Office: [phone]    Catalina Antigua 09/13/2023, 1:29 PM

## 2023-09-13 NOTE — Progress Notes (Signed)
 EEG complete - results pending

## 2023-09-13 NOTE — Plan of Care (Signed)

## 2023-09-13 NOTE — Progress Notes (Addendum)
Inpatient Rehab Admissions Coordinator:   Pt participated in all 3 therapy sessions over the last 2 days.  I discussed with Dr. Riley Kill this morning and he is in agreement to pursue insurance authorization with plan to d/c back to pt's apartment with s/o providing assist.    1101: Met with pt and significant other at bedside to update on rehab.  Both confirm plan to discharge back to pt's apartment (obtained through disability and they have been living there since December).  Working with case Production designer, theatre/television/film through insurance to get set up with [facility] transport for appointments.  Pt acknowledges expectation of working hard on rehab and not declining any therapy sessions.  Director of Rehab to review.   Estill Dooms, PT, DPT Admissions Coordinator [phone] 09/13/23  9:24 AM

## 2023-09-13 NOTE — Progress Notes (Signed)
Progress Note   Patient: Craig Schmidt UJW:[id number] DOB: [birth year] DOA: 09/03/2023     9 DOS: the patient was seen and examined on 09/13/2023   Brief hospital course: 53 yr old man who presented to [facility] ED on 09/03/2023 with complaints of loss of vision, weakness, and inability to stand. He had been admitted to [facility] 07/09/2024 for acute systolic heart failure, and 06/26/23 for multifocal pneumonia with necrotizing component s/p BAL with negative bacterial culture and AFB/fungal cultures.   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   CT head was negative, and the patient left AMA. He was unable to move his left arm at this time, and this was pointed out to the patient. Ultimately he did come back to have the MRI which confirmed a stroke.  MRI demonstrated a large 3 cm right parietal and insular embolic infarct along with 3 small punctate infarcts in the right medial occipital lobes and left frontal vertex and left inferior cerebellum. CTA of the head/neck demonstrated a right MCA branch occlusion corresponding to the infarct.   Assessment and Plan: Acute CVA (cerebrovascular accident) (HCC)/concern for embolic etiology The patient has suffered a right parietal infact with a occlusion of the right MCA branch.  Seen by neurology. Counseled regarding his polysubstance abuse. Noted to be on aspirin and Eliquis.  Noted to be on statin. LDL noted to be 77.  He underwent recent echocardiogram in December which showed LVEF of less than 20%.  HbA1c was 5.6 in November. Seen by PT and OT.  Inpatient rehab insurance authorization is currently in progress Started on gabapentin for neuropathic pain in his feet.   Dysphagia Seen by speech therapy.  Remains on heart healthy diet.     AKI - IV  NS 100 cc/hr (caution due to hx of CHF)   Dyspnea - Monitor    Cocaine abuse (HCC) Pt has been advised that he must stop cocaine use to avoid catastrophic CVA in the future.   Essential hypertension - Hold lasix, entresto, aldactone as of 09/13/2023   HFrEF (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) EF less than 20%.  Patient continues to do illicit drugs especially cocaine.   Home medication list reviewed.  He is supposed to be on carvedilol, Jardiance, furosemide and Entresto along with spironolactone (held due to hypotension on 09/13/2023)   Chronic kidney disease disease stage II Monitor    Hyperlipidemia Continue lipitor 40 mg daily.   Hypertension, malignant Monitor   Alcohol abuse Placed on CIWA protocol.  No evidence for withdrawal currently.  Continue thiamine multivitamins.  Subjective: Pt seen and examined at the bedside. He is working more w/ therapy and CIR is Research scientist (life sciences).  However, nursing advised that the pt had a 3 min seizure while on the commode today. Dr. Wilford Corner from neurology was consulted 09/13/2023 at 1151 AM and will evaluate the pt for today's seizure.  IV NS ordered as the pt was hypotensive after this seizure event.  Physical Exam: Vitals:   09/13/23 0435 09/13/23 0832 09/13/23 1138 09/13/23 1140  BP: 110/80 114/82 (!) 84/60 (!) 77/59  Pulse: 89 92    Resp: 18 18    Temp:  98.2 F (36.8 C)    TempSrc:  Oral    SpO2: 97% 100%  Weight:      Height:       HENT:     Head: Atraumatic.     Mouth/Throat:     Mouth: Mucous membranes are moist.  Cardiovascular:     Rate and Rhythm: Normal rate and regular rhythm.  Pulmonary:     Effort: Pulmonary effort is normal.  Abdominal:     Palpations: Abdomen is soft.  Musculoskeletal:     Cervical back: Neck supple.  Skin:    General: Skin is warm.  Neurological:     Mental Status: He is alert. Mental status is at baseline.  Psychiatric:        Mood and Affect: Mood normal.       Disposition: Status is: Inpatient Remains inpatient appropriate because: Awaiting CIR auth and eval for seizure on 09/13/2023  Planned Discharge Destination: Rehab    Time spent: 35 minutes  Author: Baron Hamper , MD 09/13/2023 11:43 AM  For on call review [email or web address].

## 2023-09-13 NOTE — Progress Notes (Signed)
NEUROLOGY PROGRESS NOTE   INTERIM HISTORY/SUBJECTIVE Recalled for possible seizue while on commode. 3 min. Eyes rolled back. Awake but tired looking after seizure. Some concern for girlfriend bringing in illicit meds/drugs  OBJECTIVE  CBC    Component Value Date/Time   WBC 8.7 09/11/2023 0527   RBC 4.18 (L) 09/11/2023 0527   HGB 11.7 (L) 09/11/2023 0527   HGB 12.6 (L) 07/29/2023 1429   HCT 37.5 (L) 09/11/2023 0527   HCT 41.2 07/29/2023 1429   PLT 301 09/11/2023 0527   PLT 477 (H) 07/29/2023 1429   MCV 89.7 09/11/2023 0527   MCV 91 07/29/2023 1429   MCH 28.0 09/11/2023 0527   MCHC 31.2 09/11/2023 0527   RDW 15.8 (H) 09/11/2023 0527   RDW 15.7 (H) 07/29/2023 1429   LYMPHSABS 1.8 09/09/2023 0834   MONOABS 0.5 09/09/2023 0834   EOSABS 0.3 09/09/2023 0834   BASOSABS 0.0 09/09/2023 0834    BMET    Component Value Date/Time   NA 135 09/13/2023 0551   NA 140 07/29/2023 1429   K 4.4 09/13/2023 0551   CL 100 09/13/2023 0551   CO2 22 09/13/2023 0551   GLUCOSE 107 (H) 09/13/2023 0551   BUN 26 (H) 09/13/2023 0551   BUN 29 (H) 07/29/2023 1429   CREATININE 1.48 (H) 09/13/2023 0551   CALCIUM 9.2 09/13/2023 0551   EGFR 69 07/29/2023 1429   GFRNONAA 57 (L) 09/13/2023 0551    Imaging personally reviewed-MRI brain from September 03, 2023 shows acute right MCA infarct, also infarct seen in the left frontal and left cerebellar regions.  Chronic microvascular disease.   Vitals:   09/13/23 1138 09/13/23 1140 09/13/23 1200 09/13/23 1209  BP: (!) 84/60 (!) 77/59 (!) 86/55 97/69  Pulse:      Resp:   16   Temp:    97.6 F (36.4 C)  TempSrc:    Axillary  SpO2:   100% 100%  Weight:      Height:         PHYSICAL EXAM  General: Slightly drowsy but able to answer questions alert HEENT: Normocephalic dermatic Excellently Cardiovascular: Regular rhythm Abdomen nondistended nontender Neurological exam Drowsy, oriented to self Able to follow commands Dysarthric Poor attention  concentration Cranial nerves: Difficult to assess given his drowsiness but does seem to have left-sided hemianopsia, no gaze preference or deviation, difficult to assess for facial asymmetry grossly looks okay. Motor examination: He is weak on both sides with the left arm being worst at possibly 2/5 and left leg also barely 3/5.  Right upper extremity is also 2/5 and right leg is also 3/5. Sensation: Appears intact to noxious stimulation Difficult to assess cerebellar exam   ASSESSMENT   [name] is a 53 y.o. 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  Today, had a seizure.  Lasted about 3 minutes.  Somewhat drowsy afterwards.  Neurology recalled today for possible seizure while on commode  Some concern for girlfriend bringing in illicit drugs   Impression: New onset seizure  in the setting of strokes versus illicit drug use    Recommendations: Ativan 1 dose 1 mg IV Hold off on antiepileptics for now Routine EEG Check urinary toxicology screen Maintain seizure precautions Ordering MRI brain stat to look for new stroke versus expansion of the old strokes Recently completed stroke workup-see prior neurology note from Dr. Pearlean Brownie.  Management of hyperlipidemia with statin Substance abuse management per primary team Will follow    -- Milon Dikes, MD Neurologist [facility]

## 2023-09-13 NOTE — Telephone Encounter (Signed)
Pharmacy Patient Advocate Encounter  Received notification from [facility] that Prior Authorization for Jardiance 10MG  tablets  has been APPROVED from 09/12/2023 to 09/11/2024. Ran test claim, Copay is $4.00. This test claim was processed through [facility]- copay amounts may vary at other pharmacies due to pharmacy/plan contracts, or as the patient moves through the different stages of their insurance plan.   PA #/Case ID/Reference #: [id number]

## 2023-09-14 DIAGNOSIS — F1410 Cocaine abuse, uncomplicated: Secondary | ICD-10-CM | POA: Diagnosis not present

## 2023-09-14 DIAGNOSIS — F1010 Alcohol abuse, uncomplicated: Secondary | ICD-10-CM | POA: Diagnosis not present

## 2023-09-14 DIAGNOSIS — Z5900 Homelessness unspecified: Secondary | ICD-10-CM

## 2023-09-14 DIAGNOSIS — I639 Cerebral infarction, unspecified: Secondary | ICD-10-CM

## 2023-09-14 LAB — BASIC METABOLIC PANEL
Anion gap: 11 (ref 5–15)
BUN: 27 mg/dL — ABNORMAL HIGH (ref 6–20)
CO2: 24 mmol/L (ref 22–32)
Calcium: 9 mg/dL (ref 8.9–10.3)
Chloride: 103 mmol/L (ref 98–111)
Creatinine, Ser: 1.6 mg/dL — ABNORMAL HIGH (ref 0.61–1.24)
GFR, Estimated: 52 mL/min — ABNORMAL LOW (ref >60)
Glucose, Bld: 112 mg/dL — ABNORMAL HIGH (ref 70–99)
Potassium: 4.4 mmol/L (ref 3.5–5.1)
Sodium: 138 mmol/L (ref 135–145)

## 2023-09-14 LAB — CBC
HCT: 41.6 % (ref 39.0–52.0)
Hemoglobin: 12.8 g/dL — ABNORMAL LOW (ref 13.0–17.0)
MCH: 27.5 pg (ref 26.0–34.0)
MCHC: 30.8 g/dL (ref 30.0–36.0)
MCV: 89.5 fL (ref 80.0–100.0)
Platelets: 421 10*3/uL — ABNORMAL HIGH (ref 150–400)
RBC: 4.65 MIL/uL (ref 4.22–5.81)
RDW: 15.4 % (ref 11.5–15.5)
WBC: 10.2 10*3/uL (ref 4.0–10.5)
nRBC: 0 % (ref 0.0–0.2)

## 2023-09-14 LAB — PHOSPHORUS: Phosphorus: 4.7 mg/dL — ABNORMAL HIGH (ref 2.5–4.6)

## 2023-09-14 LAB — MAGNESIUM: Magnesium: 2.3 mg/dL (ref 1.7–2.4)

## 2023-09-14 NOTE — Progress Notes (Addendum)
 Patient refused orthostatic vital signs due to wanting to sleep. Will attempt again in AM.  0400 Update: Patient continues to refuse orthostatic vital signs.

## 2023-09-14 NOTE — Progress Notes (Addendum)
 STROKE TEAM PROGRESS NOTE    SIGNIFICANT HOSPITAL EVENTS 2/21 neurology called back due to concern for seizure  INTERIM HISTORY/SUBJECTIVE Family at the bedside.  Patient is awake and alert sitting up in bed apparent distress  EEG was within normal limits MRI brain with acute/subacute infarct in posterior right MCA increased from last MRI, petechial hemorrhage in the acute infarct site and old left frontotemporal site. Old left frontoparietal infarct and old bilateral occipital infarcts.    OBJECTIVE  CBC    Component Value Date/Time   WBC 10.2 09/14/2023 0635   RBC 4.65 09/14/2023 0635   HGB 12.8 (L) 09/14/2023 0635   HGB 12.6 (L) 07/29/2023 1429   HCT 41.6 09/14/2023 0635   HCT 41.2 07/29/2023 1429   PLT 421 (H) 09/14/2023 0635   PLT 477 (H) 07/29/2023 1429   MCV 89.5 09/14/2023 0635   MCV 91 07/29/2023 1429   MCH 27.5 09/14/2023 0635   MCHC 30.8 09/14/2023 0635   RDW 15.4 09/14/2023 0635   RDW 15.7 (H) 07/29/2023 1429   LYMPHSABS 1.8 09/09/2023 0834   MONOABS 0.5 09/09/2023 0834   EOSABS 0.3 09/09/2023 0834   BASOSABS 0.0 09/09/2023 0834    BMET    Component Value Date/Time   NA 138 09/14/2023 0635   NA 140 07/29/2023 1429   K 4.4 09/14/2023 0635   CL 103 09/14/2023 0635   CO2 24 09/14/2023 0635   GLUCOSE 112 (H) 09/14/2023 0635   BUN 27 (H) 09/14/2023 0635   BUN 29 (H) 07/29/2023 1429   CREATININE 1.60 (H) 09/14/2023 0635   CALCIUM 9.0 09/14/2023 0635   EGFR 69 07/29/2023 1429   GFRNONAA 52 (L) 09/14/2023 0635    IMAGING past 24 hours MR BRAIN WO CONTRAST Result Date: 09/13/2023 CLINICAL DATA:  Stroke follow-up EXAM: MRI HEAD WITHOUT CONTRAST TECHNIQUE: Multiplanar, multiecho pulse sequences of the brain and surrounding structures were obtained without intravenous contrast. COMPARISON:  09/03/2023 FINDINGS: Brain: There is an acute/early subacute infarct within the posterior right MCA territory, slightly increased from the prior study. Old left  frontoparietal infarct and old bilateral occipital infarcts. Petechial hemorrhage at the acute infarct site and at the old left frontoparietal infarct site. There is multifocal hyperintense T2-weighted signal within the white matter. Generalized volume loss. Mega cisterna magna. Vascular: Normal flow voids. Skull and upper cervical spine: Normal calvarium and skull base. Visualized upper cervical spine and soft tissues are normal. Sinuses/Orbits:No paranasal sinus fluid levels or advanced mucosal thickening. No mastoid or middle ear effusion. Normal orbits. IMPRESSION: 1. Acute/early subacute infarct within the posterior right MCA territory, slightly increased from the prior study. Petechial hemorrhage at the acute infarct site and at the old left frontoparietal infarct site. 2. Old left frontoparietal infarct and old bilateral occipital infarcts. Electronically Signed   By: Deatra Robinson M.D.   On: 09/13/2023 20:16   EEG adult Result Date: 09/13/2023 Charlsie Quest, MD     09/14/2023  9:10 AM Patient Name: Craig Schmidt MRN: [id number] Epilepsy Attending: Charlsie Quest Referring Physician/Provider: Elmer Picker, NP Date: 09/13/2023 Duration: 27.41 mins Patient history: 53yo M with seizure like activity. EEG to evaluate for seizure Level of alertness: Awake, asleep AEDs during EEG study: None Technical aspects: This EEG study was done with scalp electrodes positioned according to the 10-20 International system of electrode placement. Electrical activity was reviewed with band pass filter of 1-70Hz , sensitivity of 7 uV/mm, display speed of 4mm/sec with a 60Hz  notched filter applied as appropriate.  EEG data were recorded continuously and digitally stored.  Video monitoring was available and reviewed as appropriate. Description: The posterior dominant rhythm consists of 9 Hz activity of moderate voltage (25-35 uV) seen predominantly in posterior head regions, symmetric and reactive to eye opening and eye  closing. Sleep was characterized by vertex waves, sleep spindles (12 to 14 Hz), maximal frontocentral region.  Hyperventilation and photic stimulation were not performed.   EKG artifact was seen during the study IMPRESSION: This study is within normal limits. No seizures or epileptiform discharges were seen throughout the recording. A normal interictal EEG does not exclude the diagnosis of epilepsy. Charlsie Quest    Vitals:   09/14/23 2536 09/14/23 0838 09/14/23 1132 09/14/23 1542  BP: 122/89 112/88 115/86 101/78  Pulse: 78 87 89 91  Resp: 16 17 17 17   Temp: 97.8 F (36.6 C) 98.2 F (36.8 C) 98.3 F (36.8 C) (!) 97.5 F (36.4 C)  TempSrc: Oral Oral  Oral  SpO2: 98% 100% 100% 100%  Weight:      Height:         PHYSICAL EXAM General: Chronically ill male in no apparent distress Psych:  Mood and affect appropriate for situation CV: Regular rate and rhythm on monitor Respiratory:  Regular, unlabored respirations on room air GI: Abdomen soft and nontender   NEURO:  Mental Status: AA&Ox3, patient is able to give clear and coherent history Speech/Language: speech is without aphasia.  Mild dysarthria naming, repetition, fluency, and comprehension intact.  Cranial Nerves:  II: PERRL. Visual fields left hemianopia III, IV, VI: EOMI with slight right gaze preference but does cross midline. Eyelids elevate symmetrically.  V: Sensation is intact to light touch and symmetrical to face.  VII: Mild left facial droop VIII: hearing intact to voice. IX, X: Palate elevates symmetrically. Phonation is normal.  UY:[id number] shrug 5/5. XII: tongue is midline without fasciculations. Motor: Right hemibody weakness 4/5 Tone: is normal and bulk is normal Sensation- Intact to light touch bilaterally. Extinction absent to light touch to DSS.   Coordination: FTN intact bilaterally, HKS: no ataxia in BLE.No drift.  Gait- deferred   ASSESSMENT/PLAN  [name] is a 53 y.o. 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  Presented with left arm weakness.    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 No seizures or epileptiform discharges were seen throughout the recording. LDL 77 HgbA1c 5.6 VTE prophylaxis - Eliquis Eliquis (apixaban) daily non compliant prior to admission, now on Eliquis (apixaban) daily and aspirin 81 mg per cardiology recommendations given his history of CAD Therapy recommendations:  SNF Disposition: Pending   Hx of Stroke/TIA 2/17-left MCA infarct due to cardiomyopathy with low EF and cocaine use.  Was discharged on warfarin daily   Chronic HFrEF Biventricular failure Mitral valve stenosis Not a surgical candidate in the setting of continued drug [place] GDMT- coreg, spironolactone, jardiance, entresto    Hx STEMI CAD Lipitor 40mg  At one point he was on Plavix plus Eliquis with the plan to convert to  Eliquis and aspirin after 1 year, however Eliquis was discontinued for unclear reasons.  He was then resumed on Eliquis and aspirin during his hospitalization in December 2024. Troponin elevated 639 -> 444   Hyperlipidemia Home meds: Atorvastatin 40 mg, resumed in hospital LDL 77, goal < 70 Continue statin at discharge   CKD stage II Creatinine 1.26, GFR > 60   Prediabetic Home meds:  None HgbA1c 5.6, goal < 7.0 CBGs   Tobacco Abuse       Ready to quit? Yes Nicotine replacement therapy provided   Substance Abuse UDS positive for Cocaine       Ready to quit? Yes TOC  consult for cessation placed  Neurology will sign off please call with questions or concerns Hospital day # 10  Gevena Mart DNP, ACNPC-AG  [facility]   I, the attending neurologist, have personally obtained a history, examined the patient, evaluated laboratory data, individually viewed imaging studies and agree with radiology interpretations. Together with the NP/PA, we formulated the assessment and plan of care which reflects our mutual decision.  I have made any additions or clarifications directly to the above note and agree with the findings and plan as currently documented. I spent a total of 35 minutes dedicated to the care of this patient.     Windell Norfolk, MD Neurology 09/14/2023 5:59 PM      To contact Stroke Continuity provider, please refer to [email or web address]. After hours, contact General Neurology

## 2023-09-14 NOTE — Progress Notes (Addendum)
 [facility]  PROGRESS NOTE  Craig Schmidt:[id number] DOB: Mar 02, 1971 DOA: 09/03/2023 PCP: Rema Fendt, NP   Brief HPI:   53 yr old man who presented to [facility] ED on 09/03/2023 with complaints of loss of vision, weakness, and inability to stand. He had been admitted to [facility] 07/09/2024 for acute systolic heart failure, and 06/26/23 for multifocal pneumonia with necrotizing component s/p BAL with negative bacterial culture and AFB/fungal cultures.   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   CT head was negative, and the patient left AMA. He was unable to move his left arm at this time, and this was pointed out to the patient. Ultimately he did come back to have the MRI which confirmed a stroke.  MRI demonstrated a large 3 cm right parietal and insular embolic infarct along with 3 small punctate infarcts in the right medial occipital lobes and left frontal vertex and left inferior cerebellum. CTA of the head/neck demonstrated a right MCA branch occlusion corresponding to the infarct    Assessment/Plan:   Acute CVA (cerebrovascular accident) (HCC)/concern for embolic etiology The patient has suffered a right parietal infact with a occlusion of the right MCA branch.  Seen by neurology. Counseled regarding his polysubstance abuse. Noted to be on aspirin and Eliquis.  Noted to be on statin. LDL noted to be 77.  He underwent recent echocardiogram in December which showed LVEF of less than 20%.  HbA1c was 5.6 in November. Seen by PT and OT.  Inpatient rehab insurance authorization is currently in progress Started on gabapentin for neuropathic pain in his feet.   Dysphagia Seen by speech therapy.  Remains on heart healthy diet.     AKI versus CKD stage IIIa -Creatinine  1.60 -Follow renal function in a.m.  Cocaine abuse (HCC) Pt has been advised that he must stop cocaine use to avoid catastrophic CVA in the future.   Essential hypertension - Hold lasix, entresto, aldactone as of 09/13/2023   HFrEF (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) EF less than 20%.  Patient continues to do illicit drugs especially cocaine.   Home medication list reviewed.  He is supposed to be on carvedilol, Jardiance, furosemide and Entresto along with spironolactone (held due to hypotension on 09/13/2023)    Hyperlipidemia Continue lipitor 40 mg daily.   Hypertension, malignant Monitor   Alcohol abuse Placed on CIWA protocol.  No evidence for withdrawal currently.  Continue thiamine multivitamins.    Medications     apixaban  5 mg Oral BID   aspirin EC  81 mg Oral Daily   atorvastatin  40 mg Oral Daily   folic acid  1 mg Oral Daily   gabapentin  200 mg Oral BID   mirtazapine  15 mg Oral QHS   multivitamin with minerals  1 tablet Oral Daily   nicotine  14 mg Transdermal Daily   thiamine  100 mg Oral Daily   Or   thiamine  100 mg Intravenous Daily     Data Reviewed:   CBG:  No results for input(s): "GLUCAP" in the last 168 hours.  SpO2: 100 %    Vitals:   09/13/23 2041 09/14/23 0019 09/14/23 0417 09/14/23 0838  BP: 111/74 103/66 122/89 112/88  Pulse: 85 86 78 87  Resp: 18 16  16 17  Temp: 98.2 F (36.8 C) 97.8 F (36.6 C) 97.8 F (36.6 C) 98.2 F (36.8 C)  TempSrc: Oral Oral Oral Oral  SpO2: 100% 93% 98% 100%  Weight:      Height:          Data Reviewed:  Basic Metabolic Panel: Recent Labs  Lab 09/09/23 0834 09/11/23 0527 09/12/23 0650 09/13/23 0551 09/14/23 0635  NA 139 138 135 135 138  K 4.3 4.6 4.5 4.4 4.4  CL 110 109 103 100 103  CO2 20* 21* 24 22 24   GLUCOSE 110* 102* 104* 107* 112*  BUN 17 22* 20 26* 27*  CREATININE 1.56* 1.81* 1.54* 1.48* 1.60*  CALCIUM 8.6* 8.8* 9.0 9.2 9.0  MG  --   --  2.0  --  2.3  PHOS   --   --   --   --  4.7*    CBC: Recent Labs  Lab 09/08/23 1210 09/09/23 0834 09/11/23 0527 09/14/23 0635  WBC 8.8 9.0 8.7 10.2  NEUTROABS 6.9 6.4  --   --   HGB 11.1* 11.1* 11.7* 12.8*  HCT 34.7* 35.0* 37.5* 41.6  MCV 87.4 87.5 89.7 89.5  PLT 250 260 301 421*    LFT No results for input(s): "AST", "ALT", "ALKPHOS", "BILITOT", "PROT", "ALBUMIN" in the last 168 hours.   Antibiotics: Anti-infectives (From admission, onward)    None        DVT prophylaxis: Apixaban  Code Status: Full code  Family Communication: No family at bedside   CONSULTS neurology   Subjective   Denies any complaints.   Objective    Physical Examination:   General-appears in no acute distress Heart-S1-S2, regular, no murmur auscultated Lungs-clear to auscultation bilaterally, no wheezing or crackles auscultated Abdomen-soft, nontender, no organomegaly Extremities-left upper extremity weakness Neuro-alert, oriented x3, no focal deficit noted   Status is: Inpatient:             Meredeth Ide   [facility] If 7PM-7AM, please contact night-coverage at [email or web address], Office  [phone]   09/14/2023, 9:33 AM  LOS: 10 days

## 2023-09-14 NOTE — Progress Notes (Signed)
 Tele sitter notified this RN that patient's visitor/significant other was vaping at bedside. Significant other educated and reminded of the [place] no smoking/vaping/e-cig on campus policy. SO verbalized understanding and agreed to put away vape. Will continue to monitor.

## 2023-09-14 NOTE — Plan of Care (Signed)
 Has been talking with is signif other about his bills and not having enough money to pay his bills.  Had to be reminded to try to calm self down and take a step back from the bills, as he was getting self worked up and becoming angry.   Problem: Education: Goal: Knowledge of disease or condition will improve Outcome: Progressing   Problem: Ischemic Stroke/TIA Tissue Perfusion: Goal: Complications of ischemic stroke/TIA will be minimized Outcome: Progressing   Problem: Coping: Goal: Will verbalize positive feelings about self Outcome: Progressing   Problem: Nutrition: Goal: Risk of aspiration will decrease Outcome: Progressing   Problem: Activity: Goal: Risk for activity intolerance will decrease Outcome: Progressing   Problem: Nutrition: Goal: Adequate nutrition will be maintained Outcome: Progressing   Problem: Coping: Goal: Level of anxiety will decrease Outcome: Progressing

## 2023-09-14 NOTE — Plan of Care (Signed)
 MRI brain completed-acute/early subacute infarct within the posterior right MCA territory mildly increased from the prior study.  Petechial hemorrhage at the acute infarct site and at the old left frontotemporal infarct site.  Old left frontoparietal infarct and old bilateral occipital infarcts. EEG-normal. Will hold off on antiepileptics for now.  If he has any other spell concerning for seizure, then start him on Keppra 500 twice daily. He is on Eliquis and aspirin.  Okay to continue for now. I would repeat a head CT on Saturday to ensure there is no obvious bleed. Stroke team to follow.  -- Milon Dikes, MD Neurologist [facility]

## 2023-09-15 DIAGNOSIS — F1010 Alcohol abuse, uncomplicated: Secondary | ICD-10-CM | POA: Diagnosis not present

## 2023-09-15 DIAGNOSIS — I639 Cerebral infarction, unspecified: Secondary | ICD-10-CM | POA: Diagnosis not present

## 2023-09-15 DIAGNOSIS — F1410 Cocaine abuse, uncomplicated: Secondary | ICD-10-CM | POA: Diagnosis not present

## 2023-09-15 DIAGNOSIS — Z5900 Homelessness unspecified: Secondary | ICD-10-CM | POA: Diagnosis not present

## 2023-09-15 LAB — COMPREHENSIVE METABOLIC PANEL
ALT: 17 U/L (ref 0–44)
AST: 42 U/L — ABNORMAL HIGH (ref 15–41)
Albumin: 2.9 g/dL — ABNORMAL LOW (ref 3.5–5.0)
Alkaline Phosphatase: 96 U/L (ref 38–126)
Anion gap: 7 (ref 5–15)
BUN: 34 mg/dL — ABNORMAL HIGH (ref 6–20)
CO2: 24 mmol/L (ref 22–32)
Calcium: 9.1 mg/dL (ref 8.9–10.3)
Chloride: 105 mmol/L (ref 98–111)
Creatinine, Ser: 1.44 mg/dL — ABNORMAL HIGH (ref 0.61–1.24)
GFR, Estimated: 59 mL/min — ABNORMAL LOW (ref >60)
Glucose, Bld: 103 mg/dL — ABNORMAL HIGH (ref 70–99)
Potassium: 4.7 mmol/L (ref 3.5–5.1)
Sodium: 136 mmol/L (ref 135–145)
Total Bilirubin: 0.5 mg/dL (ref 0.0–1.2)
Total Protein: 6.8 g/dL (ref 6.5–8.1)

## 2023-09-15 NOTE — Plan of Care (Signed)
 Problem: Education: Goal: Knowledge of disease or condition will improve 09/15/2023 1529 by Amado Coe, RN Outcome: Progressing 09/15/2023 1529 by Amado Coe, RN Outcome: Progressing Goal: Knowledge of secondary prevention will improve (MUST DOCUMENT ALL) 09/15/2023 1529 by Amado Coe, RN Outcome: Progressing 09/15/2023 1529 by Amado Coe, RN Outcome: Progressing Goal: Knowledge of patient specific risk factors will improve (DELETE if not current risk factor) 09/15/2023 1529 by Amado Coe, RN Outcome: Progressing 09/15/2023 1529 by Amado Coe, RN Outcome: Progressing   Problem: Ischemic Stroke/TIA Tissue Perfusion: Goal: Complications of ischemic stroke/TIA will be minimized 09/15/2023 1529 by Amado Coe, RN Outcome: Progressing 09/15/2023 1529 by Amado Coe, RN Outcome: Progressing   Problem: Coping: Goal: Will verbalize positive feelings about self 09/15/2023 1529 by Amado Coe, RN Outcome: Progressing 09/15/2023 1529 by Amado Coe, RN Outcome: Progressing Goal: Will identify appropriate support needs 09/15/2023 1529 by Amado Coe, RN Outcome: Progressing 09/15/2023 1529 by Amado Coe, RN Outcome: Progressing   Problem: Health Behavior/Discharge Planning: Goal: Ability to manage health-related needs will improve 09/15/2023 1529 by Amado Coe, RN Outcome: Progressing 09/15/2023 1529 by Amado Coe, RN Outcome: Progressing Goal: Goals will be collaboratively established with patient/family 09/15/2023 1529 by Amado Coe, RN Outcome: Progressing 09/15/2023 1529 by Amado Coe, RN Outcome: Progressing   Problem: Self-Care: Goal: Ability to participate in self-care as condition permits will improve 09/15/2023 1529 by Amado Coe, RN Outcome: Progressing 09/15/2023 1529 by Amado Coe, RN Outcome: Progressing Goal: Verbalization of feelings and concerns over difficulty with self-care will  improve 09/15/2023 1529 by Amado Coe, RN Outcome: Progressing 09/15/2023 1529 by Amado Coe, RN Outcome: Progressing Goal: Ability to communicate needs accurately will improve 09/15/2023 1529 by Amado Coe, RN Outcome: Progressing 09/15/2023 1529 by Amado Coe, RN Outcome: Progressing   Problem: Nutrition: Goal: Risk of aspiration will decrease 09/15/2023 1529 by Amado Coe, RN Outcome: Progressing 09/15/2023 1529 by Amado Coe, RN Outcome: Progressing Goal: Dietary intake will improve 09/15/2023 1529 by Amado Coe, RN Outcome: Progressing 09/15/2023 1529 by Amado Coe, RN Outcome: Progressing   Problem: Education: Goal: Knowledge of General Education information will improve Description: Including pain rating scale, medication(s)/side effects and non-pharmacologic comfort measures 09/15/2023 1529 by Amado Coe, RN Outcome: Progressing 09/15/2023 1529 by Amado Coe, RN Outcome: Progressing   Problem: Health Behavior/Discharge Planning: Goal: Ability to manage health-related needs will improve 09/15/2023 1529 by Amado Coe, RN Outcome: Progressing 09/15/2023 1529 by Amado Coe, RN Outcome: Progressing   Problem: Clinical Measurements: Goal: Ability to maintain clinical measurements within normal limits will improve 09/15/2023 1529 by Amado Coe, RN Outcome: Progressing 09/15/2023 1529 by Amado Coe, RN Outcome: Progressing Goal: Will remain free from infection 09/15/2023 1529 by Amado Coe, RN Outcome: Progressing 09/15/2023 1529 by Amado Coe, RN Outcome: Progressing Goal: Diagnostic test results will improve 09/15/2023 1529 by Amado Coe, RN Outcome: Progressing 09/15/2023 1529 by Amado Coe, RN Outcome: Progressing Goal: Respiratory complications will improve 09/15/2023 1529 by Amado Coe, RN Outcome: Progressing 09/15/2023 1529 by Amado Coe, RN Outcome: Progressing Goal:  Cardiovascular complication will be avoided 09/15/2023 1529 by Amado Coe, RN Outcome: Progressing 09/15/2023 1529 by Amado Coe, RN Outcome: Progressing   Problem: Activity: Goal: Risk for activity intolerance will decrease 09/15/2023 1529 by Amado Coe, RN Outcome: Progressing 09/15/2023 1529 by  Amado Coe, RN Outcome: Progressing   Problem: Nutrition: Goal: Adequate nutrition will be maintained 09/15/2023 1529 by Amado Coe, RN Outcome: Progressing 09/15/2023 1529 by Amado Coe, RN Outcome: Progressing   Problem: Coping: Goal: Level of anxiety will decrease 09/15/2023 1529 by Amado Coe, RN Outcome: Progressing 09/15/2023 1529 by Amado Coe, RN Outcome: Progressing   Problem: Elimination: Goal: Will not experience complications related to bowel motility 09/15/2023 1529 by Amado Coe, RN Outcome: Progressing 09/15/2023 1529 by Amado Coe, RN Outcome: Progressing Goal: Will not experience complications related to urinary retention 09/15/2023 1529 by Amado Coe, RN Outcome: Progressing 09/15/2023 1529 by Amado Coe, RN Outcome: Progressing   Problem: Pain Managment: Goal: General experience of comfort will improve and/or be controlled 09/15/2023 1529 by Amado Coe, RN Outcome: Progressing 09/15/2023 1529 by Amado Coe, RN Outcome: Progressing   Problem: Safety: Goal: Ability to remain free from injury will improve 09/15/2023 1529 by Amado Coe, RN Outcome: Progressing 09/15/2023 1529 by Amado Coe, RN Outcome: Progressing   Problem: Skin Integrity: Goal: Risk for impaired skin integrity will decrease 09/15/2023 1529 by Amado Coe, RN Outcome: Progressing 09/15/2023 1529 by Amado Coe, RN Outcome: Progressing

## 2023-09-15 NOTE — Progress Notes (Addendum)
 [facility]  PROGRESS NOTE  Craig Schmidt ZOX:[id number] DOB: 1971-03-14 DOA: 09/03/2023 PCP: Rema Fendt, NP   Brief HPI:   53 yr old man who presented to [facility] ED on 09/03/2023 with complaints of loss of vision, weakness, and inability to stand. He had been admitted to [facility] 07/09/2024 for acute systolic heart failure, and 06/26/23 for multifocal pneumonia with necrotizing component s/p BAL with negative bacterial culture and AFB/fungal cultures.   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   CT head was negative, and the patient left AMA. He was unable to move his left arm at this time, and this was pointed out to the patient. Ultimately he did come back to have the MRI which confirmed a stroke.  MRI demonstrated a large 3 cm right parietal and insular embolic infarct along with 3 small punctate infarcts in the right medial occipital lobes and left frontal vertex and left inferior cerebellum. CTA of the head/neck demonstrated a right MCA branch occlusion corresponding to the infarct    Assessment/Plan:   Acute CVA (cerebrovascular accident) (HCC)/concern for embolic etiology The patient has suffered a right parietal infact with a occlusion of the right MCA branch.  Seen by neurology. Counseled regarding his polysubstance abuse. Noted to be on aspirin and Eliquis.  Noted to be on statin. LDL noted to be 77.  He underwent recent echocardiogram in December which showed LVEF of less than 20%.  HbA1c was 5.6 in November. Seen by PT and OT.  Inpatient rehab insurance authorization is currently in progress Started on gabapentin for neuropathic pain in his feet.   Dysphagia Seen by speech therapy.  Remains on heart healthy diet.      CKD stage IIIa -Creatinine 1.44 -Follow  renal function in a.m.  Cocaine abuse (HCC) Pt has been advised that he must stop cocaine use to avoid catastrophic CVA in the future.   Essential hypertension - Hold lasix, entresto, aldactone as of 09/13/2023   HFrEF (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) EF less than 20%.  Patient continues to do illicit drugs especially cocaine.   Home medication list reviewed.  He is supposed to be on carvedilol, Jardiance, furosemide and Entresto along with spironolactone (held due to hypotension on 09/13/2023) -Consider restarting these medications as blood pressure improves    Hyperlipidemia Continue lipitor 40 mg daily.   Hypertension, malignant Monitor   Alcohol abuse Placed on CIWA protocol.  No evidence for withdrawal currently.  Continue thiamine multivitamins.  Awaiting bed at skilled nursing facility  Medications     apixaban  5 mg Oral BID   aspirin EC  81 mg Oral Daily   atorvastatin  40 mg Oral Daily   folic acid  1 mg Oral Daily   gabapentin  200 mg Oral BID   mirtazapine  15 mg Oral QHS   multivitamin with minerals  1 tablet Oral Daily   nicotine  14 mg Transdermal Daily   thiamine  100 mg Oral Daily   Or   thiamine  100 mg Intravenous Daily     Data Reviewed:   CBG:  No results for input(s): "GLUCAP" in the last 168 hours.  SpO2: 97 %    Vitals:   09/15/23 0015 09/15/23 0424 09/15/23 0805 09/15/23 1201  BP: 109/74 116/78  129/77 117/71  Pulse: 100 (!) 101 93 94  Resp: 18 16 16 18   Temp: 98.4 F (36.9 C) 97.6 F (36.4 C) 97.6 F (36.4 C) 98 F (36.7 C)  TempSrc: Oral Oral Oral Oral  SpO2: 100% 99% 99% 97%  Weight:      Height:          Data Reviewed:  Basic Metabolic Panel: Recent Labs  Lab 09/11/23 0527 09/12/23 0650 09/13/23 0551 09/14/23 0635 09/15/23 0631  NA 138 135 135 138 136  K 4.6 4.5 4.4 4.4 4.7  CL 109 103 100 103 105  CO2 21* 24 22 24 24   GLUCOSE 102* 104* 107* 112* 103*  BUN 22* 20 26* 27* 34*  CREATININE  1.81* 1.54* 1.48* 1.60* 1.44*  CALCIUM 8.8* 9.0 9.2 9.0 9.1  MG  --  2.0  --  2.3  --   PHOS  --   --   --  4.7*  --     CBC: Recent Labs  Lab 09/09/23 0834 09/11/23 0527 09/14/23 0635  WBC 9.0 8.7 10.2  NEUTROABS 6.4  --   --   HGB 11.1* 11.7* 12.8*  HCT 35.0* 37.5* 41.6  MCV 87.5 89.7 89.5  PLT 260 301 421*    LFT Recent Labs  Lab 09/15/23 0631  AST 42*  ALT 17  ALKPHOS 96  BILITOT 0.5  PROT 6.8  ALBUMIN 2.9*     Antibiotics: Anti-infectives (From admission, onward)    None        DVT prophylaxis: Apixaban  Code Status: Full code  Family Communication: No family at bedside   CONSULTS neurology   Subjective    No new complaints.  Objective    Physical Examination:   General-appears in no acute distress Heart-S1-S2, regular, no murmur auscultated Lungs-clear to auscultation bilaterally, no wheezing or crackles auscultated Abdomen-soft, nontender, no organomegaly Extremities-no edema in the lower extremities Neuro-alert, oriented x3, left upper extremity weakness   Status is: Inpatient:             Meredeth Ide   [facility] If 7PM-7AM, please contact night-coverage at [email or web address], Office  [phone]   09/15/2023, 1:08 PM  LOS: 11 days

## 2023-09-15 NOTE — Plan of Care (Signed)

## 2023-09-16 DIAGNOSIS — I639 Cerebral infarction, unspecified: Secondary | ICD-10-CM | POA: Diagnosis not present

## 2023-09-16 LAB — GLUCOSE, CAPILLARY: Glucose-Capillary: 107 mg/dL — ABNORMAL HIGH (ref 70–99)

## 2023-09-16 LAB — BASIC METABOLIC PANEL
Anion gap: 8 (ref 5–15)
BUN: 31 mg/dL — ABNORMAL HIGH (ref 6–20)
CO2: 22 mmol/L (ref 22–32)
Calcium: 9.3 mg/dL (ref 8.9–10.3)
Chloride: 105 mmol/L (ref 98–111)
Creatinine, Ser: 1.36 mg/dL — ABNORMAL HIGH (ref 0.61–1.24)
GFR, Estimated: >60 mL/min (ref >60)
Glucose, Bld: 104 mg/dL — ABNORMAL HIGH (ref 70–99)
Potassium: 4.9 mmol/L (ref 3.5–5.1)
Sodium: 135 mmol/L (ref 135–145)

## 2023-09-16 NOTE — TOC Progression Note (Signed)
 Transition of Care [facility]) - Progression Note    Patient Details  Name: Craig Schmidt MRN: [id number] Date of Birth: [birth year]  Transition of Care [facility]) CM/SW Contact  Baldemar Lenis, [place] Phone Number: 09/16/2023, 2:31 PM  Clinical Narrative:   CSW met with patient per request, he was agitated about a folder of paperwork that he said was given to hospital staff. CSW explained that she was unaware of any paperwork or folder, and asked about what the paperwork was for. Patient working to obtain caregiver support when he discharges home. CSW spoke with caseworker assisting with the patient, she faxed paperwork over to be completed for patient to be assessed for caregivers. CSW to complete paperwork and send back. CSW to follow.    Expected Discharge Plan: IP Rehab Facility Barriers to Discharge: Insurance Authorization  Expected Discharge Plan and Services       Living arrangements for the past 2 months: Apartment                                       Social Determinants of Health (SDOH) Interventions SDOH Screenings   Food Insecurity: Food Insecurity Present (09/04/2023)  Housing: High Risk (09/04/2023)  Transportation Needs: Unmet Transportation Needs (09/04/2023)  Utilities: Not At Risk (09/04/2023)  Recent Concern: Utilities - At Risk (08/12/2023)  Alcohol Screen: High Risk (09/19/2021)  Depression (PHQ2-9): High Risk (06/13/2022)  Financial Resource Strain: High Risk (07/19/2023)  Stress: Stress Concern Present (10/16/2022)  Tobacco Use: High Risk (09/04/2023)    Readmission Risk Interventions    07/12/2023    1:06 PM 06/28/2023    1:27 PM 06/21/2023   12:50 PM  Readmission Risk Prevention Plan  Transportation Screening Complete Complete Complete  PCP or Specialist Appt within 3-5 Days  Complete   HRI or Home Care Consult  Complete   Social Work Consult for Recovery Care Planning/Counseling  Complete   Palliative Care Screening  Not Applicable    Medication Review Oceanographer) Complete Complete Complete  PCP or Specialist appointment within 3-5 days of discharge Complete  Complete  HRI or Home Care Consult Complete  Complete  SW Recovery Care/Counseling Consult Complete  Complete  Palliative Care Screening Not Applicable  Not Applicable  Skilled Nursing Facility Not Applicable  Not Applicable

## 2023-09-16 NOTE — Progress Notes (Signed)
   09/16/23 1200  Hand-off documentation  Hand-off Received Received from shift RN/LPN  Report received from (Full Name) [facility] turned over for remainder of 7A shift.  Handoff received from [place], [place]

## 2023-09-16 NOTE — TOC Progression Note (Signed)
 Transition of Care [facility]) - Progression Note    Patient Details  Name: Craig Schmidt MRN: [id number] Date of Birth: [birth year]  Transition of Care [facility]) CM/SW Contact  Ronny Bacon, RN Phone Number: 09/16/2023, 3:43 PM  Clinical Narrative:   Secure message received from CIR, unable to admit patient to CIR at this time, out of network with insurance coverage. TOC will continue to follow for discharge needs.    Expected Discharge Plan: IP Rehab Facility Barriers to Discharge: Insurance Authorization  Expected Discharge Plan and Services       Living arrangements for the past 2 months: Apartment                                       Social Determinants of Health (SDOH) Interventions SDOH Screenings   Food Insecurity: Food Insecurity Present (09/04/2023)  Housing: High Risk (09/04/2023)  Transportation Needs: Unmet Transportation Needs (09/04/2023)  Utilities: Not At Risk (09/04/2023)  Recent Concern: Utilities - At Risk (08/12/2023)  Alcohol Screen: High Risk (09/19/2021)  Depression (PHQ2-9): High Risk (06/13/2022)  Financial Resource Strain: High Risk (07/19/2023)  Stress: Stress Concern Present (10/16/2022)  Tobacco Use: High Risk (09/04/2023)    Readmission Risk Interventions    07/12/2023    1:06 PM 06/28/2023    1:27 PM 06/21/2023   12:50 PM  Readmission Risk Prevention Plan  Transportation Screening Complete Complete Complete  PCP or Specialist Appt within 3-5 Days  Complete   HRI or Home Care Consult  Complete   Social Work Consult for Recovery Care Planning/Counseling  Complete   Palliative Care Screening  Not Applicable   Medication Review Oceanographer) Complete Complete Complete  PCP or Specialist appointment within 3-5 days of discharge Complete  Complete  HRI or Home Care Consult Complete  Complete  SW Recovery Care/Counseling Consult Complete  Complete  Palliative Care Screening Not Applicable  Not Applicable  Skilled Nursing Facility  Not Applicable  Not Applicable

## 2023-09-16 NOTE — Progress Notes (Signed)
 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  Patient presented to [facility] ED on 09/03/2023 with complaints of loss of vision, weakness, and inability to stand.  CT head was negative, and the patient left AMA. He was unable to move his left arm at this time, and this was pointed out to the patient. Ultimately he did come back to have the MRI which confirmed a stroke.  MRI demonstrated a large 3 cm right parietal and insular embolic infarct along with 3 small punctate infarcts in the right medial occipital lobes and left frontal vertex and left inferior cerebellum. CTA of the head/neck demonstrated a right MCA branch occlusion corresponding to the infarct.    Other significant prior history includes admission to [facility] 07/10/2023 for acute systolic heart failure, and 06/26/2023 for multifocal pneumonia with necrotizing component s/p BAL with negative bacterial culture and AFB/fungal cultures.    09/16/2023: Patient is awaiting disposition.  It remains on suction if patient will be accepted to CIR.  Pursue disposition.   Assessment/Plan:   Acute CVA (cerebrovascular accident) (HCC)/concern for embolic etiology The patient has suffered a right parietal infact with a occlusion of the right MCA branch.  Seen by neurology. Counseled regarding his polysubstance abuse. Noted to be on aspirin and Eliquis.  Noted to be on statin. LDL noted to be 77.  He underwent recent echocardiogram in December which showed LVEF of less than 20%.  HbA1c was 5.6 in November. Seen by PT and OT.  Inpatient rehab insurance authorization is currently in progress Started  on gabapentin for neuropathic pain in his feet. 09/16/2023: Pursue disposition.   Dysphagia Seen by speech therapy.  Remains on heart healthy diet.      CKD stage IIIa -Creatinine 1.44 -Follow renal function in a.m.  Cocaine abuse (HCC) Pt has been advised that he must stop cocaine use to avoid catastrophic CVA in the future.   Essential hypertension - Hold lasix, entresto, aldactone as of 09/13/2023   HFrEF (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) EF less than 20%.  Patient continues to do illicit drugs especially cocaine.   Home medication list reviewed.  He is supposed to be on carvedilol, Jardiance, furosemide and Entresto along with spironolactone (held due to hypotension on 09/13/2023) -Consider restarting these medications as blood pressure improves  Hyperlipidemia Continue lipitor 40 mg daily.   Hypertension, malignant Monitor   Alcohol abuse Placed on CIWA protocol.  No evidence for withdrawal currently.  Continue thiamine multivitamins.  Awaiting bed at skilled nursing facility  Medications     apixaban  5 mg Oral BID   aspirin EC  81 mg Oral Daily   atorvastatin  40 mg Oral Daily   folic acid  1 mg Oral Daily   gabapentin  200 mg Oral BID   mirtazapine  15 mg Oral QHS   multivitamin with minerals  1 tablet Oral Daily   nicotine  14 mg Transdermal Daily   thiamine  100 mg Oral Daily   Or   thiamine  100 mg Intravenous Daily     Data Reviewed:   CBG:  Recent Labs  Lab 09/13/23 1239  GLUCAP  107*    SpO2: 100 %    Vitals:   09/15/23 1956 09/15/23 2325 09/16/23 0334 09/16/23 0804  BP: 116/71 110/72 114/80 (!) 126/97  Pulse: 96 92 96 96  Resp: 18 18 18    Temp: (!) 97.5 F (36.4 C) 97.7 F (36.5 C) (!) 97.4 F (36.3 C) 97.6 F (36.4 C)  TempSrc: Oral Oral Oral Oral  SpO2: 100% 100% 100% 100%  Weight:      Height:          Data Reviewed:  Basic Metabolic Panel: Recent Labs  Lab 09/12/23 0650 09/13/23 0551 09/14/23 0635  09/15/23 0631 09/16/23 0655  NA 135 135 138 136 135  K 4.5 4.4 4.4 4.7 4.9  CL 103 100 103 105 105  CO2 24 22 24 24 22   GLUCOSE 104* 107* 112* 103* 104*  BUN 20 26* 27* 34* 31*  CREATININE 1.54* 1.48* 1.60* 1.44* 1.36*  CALCIUM 9.0 9.2 9.0 9.1 9.3  MG 2.0  --  2.3  --   --   PHOS  --   --  4.7*  --   --     CBC: Recent Labs  Lab 09/11/23 0527 09/14/23 0635  WBC 8.7 10.2  HGB 11.7* 12.8*  HCT 37.5* 41.6  MCV 89.7 89.5  PLT 301 421*    LFT Recent Labs  Lab 09/15/23 0631  AST 42*  ALT 17  ALKPHOS 96  BILITOT 0.5  PROT 6.8  ALBUMIN 2.9*     Antibiotics: Anti-infectives (From admission, onward)    None        DVT prophylaxis: Apixaban  Code Status: Full code  Family Communication: No family at bedside   CONSULTS neurology   Subjective    No new complaints.  Objective    Physical Examination: General condition: Patient is not in any distress.  Patient remains significantly ataxic.   HEENT: Mild pallor. Neck: Supple. CVs: S1-S2. Abdomen: Soft and nontender. Neuro: Ataxic. Extremities: No leg edema.  Status is: Inpatient:    Time spent: 35 minutes.  Barnetta Chapel   [facility] If 7PM-7AM, please contact night-coverage at [email or web address], Office  [phone]   09/16/2023, 8:43 AM  LOS: 12 days

## 2023-09-16 NOTE — Plan of Care (Signed)
  Problem: Education: Goal: Knowledge of disease or condition will improve Outcome: Progressing   Problem: Health Behavior/Discharge Planning: Goal: Goals will be collaboratively established with patient/family Outcome: Progressing   Problem: Self-Care: Goal: Ability to communicate needs accurately will improve Outcome: Progressing   Problem: Education: Goal: Knowledge of General Education information will improve Description: Including pain rating scale, medication(s)/side effects and non-pharmacologic comfort measures Outcome: Progressing   Problem: Clinical Measurements: Goal: Will remain free from infection Outcome: Progressing Goal: Respiratory complications will improve Outcome: Progressing Goal: Cardiovascular complication will be avoided Outcome: Progressing   Problem: Safety: Goal: Ability to remain free from injury will improve Outcome: Progressing   Problem: Skin Integrity: Goal: Risk for impaired skin integrity will decrease Outcome: Progressing   Problem: Ischemic Stroke/TIA Tissue Perfusion: Goal: Complications of ischemic stroke/TIA will be minimized Outcome: Not Progressing   Problem: Self-Care: Goal: Ability to participate in self-care as condition permits will improve Outcome: Not Progressing

## 2023-09-16 NOTE — Progress Notes (Signed)
 Occupational Therapy Treatment Patient Details Name: Craig Schmidt MRN: [id number] DOB: [birth year] Today's Date: 09/16/2023   History of present illness 53 y.o. male presenting to ED 2/12 with L sided weakness and confusion. MRI found a right parietal infarct as well as other small punctate infarcts. 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   OT comments  Pt progressing toward established OT goals. Pt with greater awareness of L side of environment this session with min-mod cues for scanning at sink. Constant report of diplopia with occlusive glasses provided today. Pt needing step by step cues for LUE coordination during grooming at sink with hand over hand A for coordination. Recommending intensive multidisciplinary rehabilitation >3 hours/day to optimize safety and independence in ADL.        If plan is discharge home, recommend the following:  A lot of help with walking and/or transfers;A lot of help with bathing/dressing/bathroom;Assistance with cooking/housework;Direct supervision/assist for medications management;Assist for transportation;Two people to help with walking and/or transfers;Two people to help with bathing/dressing/bathroom;Direct supervision/assist for financial management;Help with stairs or ramp for entrance   Equipment Recommendations  None recommended by OT    Recommendations for Other Services      Precautions / Restrictions Precautions Precautions: Fall Precaution/Restrictions Comments: apraxic LUE Restrictions Weight Bearing Restrictions Per Provider Order: No       Mobility Bed Mobility               General bed mobility comments: up in recliner    Transfers Overall transfer level: Needs assistance Equipment used: 1 person hand held assist Transfers: Sit to/from Stand Sit to Stand: Min assist            General transfer comment: min A to boost up to standing     Balance Overall balance assessment: Needs assistance Sitting-balance support: Feet supported, No upper extremity supported Sitting balance-Leahy Scale: Fair     Standing balance support: During functional activity, Single extremity supported Standing balance-Leahy Scale: Poor Standing balance comment: reliant on ext support                           ADL either performed or assessed with clinical judgement   ADL Overall ADL's : Needs assistance/impaired     Grooming: Maximal assistance;Standing Grooming Details (indicate cue type and reason): haknd over hand secondary to limb apraxia and use of built up grip             Lower Body Dressing: Maximal assistance;Sitting/lateral leans Lower Body Dressing Details (indicate cue type and reason): donning shoes             Functional mobility during ADLs: Minimal assistance General ADL Comments: 1 person HHA    Extremity/Trunk Assessment Upper Extremity Assessment Upper Extremity Assessment: LUE deficits/detail RUE Deficits / Details: prior deficits from CVA generally weak grasp; limited initiation with RUE this session RUE Coordination: decreased fine motor LUE Deficits / Details: fair progression with LUE today with pt able to grasp and release on command with incresed time however, continues to be apraxic and with significant undershooting with all reaching tasks. min-mod cues for scanning to L side during grooming tasks at sink LUE Sensation: decreased light touch LUE Coordination: decreased fine motor;decreased gross motor   Lower Extremity Assessment Lower Extremity Assessment: Defer to PT evaluation  Vision   Vision Assessment?: Yes Tracking/Visual Pursuits:  (decreaed smoothness of tracking on the R. and incresaed time with additional eye shifts on L. His glasses were not present in room) Additional Comments: Pt glasses not present in  room but consistent report of diplopia; provided visual occlusion glasses with taping over R eye as pt reports diplopia even with L eye covered so covered R eye and pt with relief from diplopia. Continues to seem slightly uncomfortable but slight improvement in expereince of undershooting althrough still present   Perception Perception Perception: Impaired Preception Impairment Details: Inattention/Neglect   Praxis Praxis Praxis: Impaired Praxis Impairment Details: Limb apraxia   Communication Communication Communication: Impaired Factors Affecting Communication: Reduced clarity of speech;Difficulty expressing self   Cognition Arousal: Alert Behavior During Therapy: WFL for tasks assessed/performed Cognition: Cognition impaired     Awareness: Intellectual awareness intact, Online awareness impaired   Attention impairment (select first level of impairment): Selective attention Executive functioning impairment (select all impairments): Initiation, Organization, Sequencing                   Following commands: Impaired Following commands impaired: Follows one step commands with increased time      Cueing   Cueing Techniques: Gestural cues, Verbal cues, Tactile cues  Exercises Exercises: Other exercises Other Exercises Other Exercises: functional reach at sink with BUE and multimodal cues to for larger amplitue reach secondary to apraxia    Shoulder Instructions       General Comments      Pertinent Vitals/ Pain       Pain Assessment Pain Assessment: No/denies pain Pain Intervention(s): Monitored during session, Limited activity within patient's tolerance  Home Living                                          Prior Functioning/Environment              Frequency  Min 1X/week        Progress Toward Goals  OT Goals(current goals can now be found in the care plan section)  Progress towards OT goals: Progressing toward goals  Acute  Rehab OT Goals Patient Stated Goal: get better OT Goal Formulation: With patient/family Time For Goal Achievement: 09/18/23 Potential to Achieve Goals: Good ADL Goals Pt Will Perform Eating: with set-up;sitting;with adaptive utensils Pt Will Perform Grooming: sitting;with adaptive equipment;with min assist Pt Will Perform Upper Body Bathing: sitting;with min assist;with adaptive equipment Pt Will Transfer to Toilet: ambulating;with contact guard assist Pt/caregiver will Perform Home Exercise Program: Increased strength;Increased ROM;Left upper extremity;With written HEP provided Additional ADL Goal #2: Pt will utilize compensatory strategies to locate 3/3 objects on his L side for ADLs  Plan      Co-evaluation                 AM-PAC OT "6 Clicks" Daily Activity     Outcome Measure   Help from another person eating meals?: A Lot Help from another person taking care of personal grooming?: A Lot Help from another person toileting, which includes using toliet, bedpan, or urinal?: Total Help from another person bathing (including washing, rinsing, drying)?: A Lot Help from another person to put on and taking off regular upper body clothing?: Total Help from another person to put on and taking off regular lower body clothing?: Total 6 Click Score: 9    End of Session  Equipment Utilized During Treatment: Gait belt  OT Visit Diagnosis: Muscle weakness (generalized) (M62.81);Unsteadiness on feet (R26.81);Other abnormalities of gait and mobility (R26.89);Hemiplegia and hemiparesis;Cognitive communication deficit (R41.841) Symptoms and signs involving cognitive functions: Cerebral infarction Hemiplegia - Right/Left: Left Hemiplegia - caused by: Cerebral infarction   Activity Tolerance Patient tolerated treatment well   Patient Left in chair;with call bell/phone within reach;with chair alarm set   Nurse Communication Mobility status        Time: 2956-2130 OT Time Calculation  (min): 30 min  Charges: OT General Charges $OT Visit: 1 Visit OT Treatments $Self Care/Home Management : 23-37 mins  Tyler Deis, OTR/L [facility] Acute Rehabilitation Office: [phone]   Myrla Halsted 09/16/2023, 2:30 PM

## 2023-09-16 NOTE — Progress Notes (Signed)
 Physical Therapy Treatment Patient Details Name: Craig Schmidt MRN: [id number] DOB: 08/31/1970 Today's Date: 09/16/2023   History of Present Illness 53 y.o. male presenting to ED 2/12 with L sided weakness and confusion. MRI found a right parietal infarct as well as other small punctate infarcts. 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    PT Comments  Pt seated up EOB on arrival and agreeable to session with continued progress towards acute goals. Pt with great participation throughout session and progressing gait tolerance with x2 bouts with HHA on L and demonstrating good carryover from previous sessions with improved posture and increased insight and awareness of deficits. Pt continues to be limited with DME progression due to poor grasp/apraxia of LUE. Pt agreeable to time up in chair at end of session. Current plan remains appropriate to address deficits and maximize functional independence and decrease caregiver burden. Pt continues to benefit from skilled PT services to progress toward functional mobility goals.    If plan is discharge home, recommend the following: A lot of help with walking and/or transfers;A lot of help with bathing/dressing/bathroom;Assistance with cooking/housework;Direct supervision/assist for medications management;Direct supervision/assist for financial management;Assist for transportation;Help with stairs or ramp for entrance;Supervision due to cognitive status   Can travel by private vehicle        Equipment Recommendations  Rollator (4 wheels);BSC/3in1    Recommendations for Other Services       Precautions / Restrictions Precautions Precautions: Fall Precaution/Restrictions Comments: apraxic LUE Restrictions Weight Bearing Restrictions Per Provider Order: No     Mobility  Bed Mobility Overal bed mobility: Needs  Assistance             General bed mobility comments: pt seated up EOB on arrival    Transfers Overall transfer level: Needs assistance Equipment used: 1 person hand held assist Transfers: Sit to/from Stand Sit to Stand: Min assist           General transfer comment: min A to boost to stand and steady on rise x3 during session from various height surfaces    Ambulation/Gait Ambulation/Gait assistance: Mod assist Gait Distance (Feet): 115 Feet (x2) Assistive device: 1 person hand held assist Gait Pattern/deviations: Decreased stride length, Ataxic, Step-through pattern, Leaning posteriorly, Wide base of support Gait velocity: slowed     General Gait Details: wide BOS with slight posterior lean, HHA on stronger L side, pt reporting he uses trekking pole at baseline, weak grip on L unable to grasp [facility] for trial.   Stairs             Wheelchair Mobility     Tilt Bed    Modified Rankin (Stroke Patients Only)       Balance Overall balance assessment: Needs assistance Sitting-balance support: Feet supported, No upper extremity supported Sitting balance-Leahy Scale: Fair Sitting balance - Comments: requires outside assist to maintain Postural control: Posterior lean Standing balance support: During functional activity, Single extremity supported Standing balance-Leahy Scale: Poor Standing balance comment: reliant on ext support                            Communication Communication Communication: Impaired Factors Affecting Communication: Reduced clarity of speech;Difficulty expressing self  Cognition Arousal: Alert Behavior During Therapy: WFL for tasks assessed/performed   PT - Cognitive impairments: Sequencing, Problem solving, Awareness, Initiation, Attention, Safety/Judgement  PT - Cognition Comments: pleasant and particapatory Following commands: Impaired Following commands impaired: Follows one step  commands with increased time    Cueing Cueing Techniques: Gestural cues, Verbal cues, Tactile cues  Exercises      General Comments General comments (skin integrity, edema, etc.): pt significant other Craig Schmidt in room      Pertinent Vitals/Pain Pain Assessment Pain Assessment: No/denies pain Pain Intervention(s): Monitored during session    Home Living                          Prior Function            PT Goals (current goals can now be found in the care plan section) Acute Rehab PT Goals PT Goal Formulation: With patient/family Time For Goal Achievement: 09/18/23 Progress towards PT goals: Progressing toward goals    Frequency    Min 1X/week      PT Plan      Co-evaluation              AM-PAC PT "6 Clicks" Mobility   Outcome Measure  Help needed turning from your back to your side while in a flat bed without using bedrails?: None Help needed moving from lying on your back to sitting on the side of a flat bed without using bedrails?: A Little Help needed moving to and from a bed to a chair (including a wheelchair)?: A Lot Help needed standing up from a chair using your arms (e.g., wheelchair or bedside chair)?: A Lot Help needed to walk in hospital room?: A Lot Help needed climbing 3-5 steps with a railing? : Total 6 Click Score: 14    End of Session Equipment Utilized During Treatment: Gait belt Activity Tolerance: Patient tolerated treatment well Patient left: with call bell/phone within reach;in chair;with chair alarm set;with family/visitor present Nurse Communication: Mobility status PT Visit Diagnosis: Unsteadiness on feet (R26.81);Other abnormalities of gait and mobility (R26.89);History of falling (Z91.81);Muscle weakness (generalized) (M62.81);Difficulty in walking, not elsewhere classified (R26.2);Other symptoms and signs involving the nervous system (R29.898);Hemiplegia and hemiparesis Hemiplegia - Right/Left: Left Hemiplegia -  dominant/non-dominant: Non-dominant Hemiplegia - caused by: Cerebral infarction     Time: 1610-9604 PT Time Calculation (min) (ACUTE ONLY): 30 min  Charges:    $Gait Training: 23-37 mins PT General Charges $$ ACUTE PT VISIT: 1 Visit                     Kiyani Jernigan R. PTA Acute Rehabilitation Services Office: [phone]   Catalina Antigua 09/16/2023, 10:35 AM

## 2023-09-16 NOTE — Plan of Care (Signed)

## 2023-09-16 NOTE — Plan of Care (Signed)
 Remains alert and oriented. Mobility limited.  Family remains at bedside.   Problem: Education: Goal: Knowledge of disease or condition will improve Outcome: Progressing Goal: Knowledge of secondary prevention will improve (MUST DOCUMENT ALL) Outcome: Progressing Goal: Knowledge of patient specific risk factors will improve (DELETE if not current risk factor) Outcome: Progressing   Problem: Ischemic Stroke/TIA Tissue Perfusion: Goal: Complications of ischemic stroke/TIA will be minimized Outcome: Progressing   Problem: Coping: Goal: Will verbalize positive feelings about self Outcome: Progressing Goal: Will identify appropriate support needs Outcome: Progressing   Problem: Self-Care: Goal: Ability to participate in self-care as condition permits will improve Outcome: Progressing Goal: Verbalization of feelings and concerns over difficulty with self-care will improve Outcome: Progressing   Problem: Nutrition: Goal: Risk of aspiration will decrease Outcome: Progressing Goal: Dietary intake will improve Outcome: Progressing   Problem: Health Behavior/Discharge Planning: Goal: Ability to manage health-related needs will improve Outcome: Progressing   Problem: Clinical Measurements: Goal: Ability to maintain clinical measurements within normal limits will improve Outcome: Progressing

## 2023-09-16 NOTE — Progress Notes (Signed)
 Inpatient Rehab Admissions Coordinator:   Case reviewed by Director of Rehab.  Pt with hx of PSA, non-compliance with medical care, and housing insecurity.  It is felt that the above in combination with pt's payor increases the patient's risk of not being able to d/c to another facility should d/c plan fall through and we cannot offer a bed at this time.  Pt mobilizing well with therapy.  Recommend preparation for direct d/c home.  Notified MD, TOC, and therapy.    Estill Dooms, PT, DPT Admissions Coordinator [phone] 09/16/23  3:37 PM

## 2023-09-17 DIAGNOSIS — I639 Cerebral infarction, unspecified: Secondary | ICD-10-CM | POA: Diagnosis not present

## 2023-09-17 NOTE — Plan of Care (Signed)
   Problem: Education: Goal: Knowledge of disease or condition will improve Outcome: Progressing Goal: Knowledge of secondary prevention will improve (MUST DOCUMENT ALL) Outcome: Progressing Goal: Knowledge of patient specific risk factors will improve (DELETE if not current risk factor) Outcome: Progressing

## 2023-09-17 NOTE — Progress Notes (Signed)
 Physical Therapy Treatment Patient Details Name: Craig Schmidt MRN: [id number] DOB: [birth year] Today's Date: 09/17/2023   History of Present Illness 53 y.o. male presenting to ED 2/12 with L sided weakness and confusion. MRI found a right parietal infarct as well as other small punctate infarcts. 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    PT Comments  Pt resting in bed on arrival and agreeable to session with steady progress towards acute goals. Pt continues to require mod A during gait with HHA on L to maintain balance, discussed with partner [name] and she is agreeable to bring in pt's walking stick for next session. Pt able to maintain standing at sink for washing face for extended time with light min A to maintain balance with pt utilizing sink for stability. Pt continues to benefit from skilled PT services to progress toward functional mobility goals.      If plan is discharge home, recommend the following: A lot of help with walking and/or transfers;A lot of help with bathing/dressing/bathroom;Assistance with cooking/housework;Direct supervision/assist for medications management;Direct supervision/assist for financial management;Assist for transportation;Help with stairs or ramp for entrance;Supervision due to cognitive status   Can travel by private vehicle        Equipment Recommendations  Rollator (4 wheels);BSC/3in1    Recommendations for Other Services       Precautions / Restrictions Precautions Precautions: Fall Precaution/Restrictions Comments: apraxic LUE Restrictions Weight Bearing Restrictions Per Provider Order: No     Mobility  Bed Mobility Overal bed mobility: Needs Assistance Bed Mobility: Supine to Sit, Sit to Supine     Supine to sit: Contact guard     General bed mobility comments: CGA for safety with increased time but no  assist needed    Transfers Overall transfer level: Needs assistance Equipment used: 1 person hand held assist Transfers: Sit to/from Stand Sit to Stand: Min assist           General transfer comment: min A to boost up to standing and stabilize    Ambulation/Gait Ambulation/Gait assistance: Mod assist Gait Distance (Feet): 150 Feet Assistive device: 1 person hand held assist Gait Pattern/deviations: Decreased stride length, Ataxic, Step-through pattern, Leaning posteriorly, Wide base of support Gait velocity: slowed     General Gait Details: wide BOS with slight posterior lean, HHA on stronger L side, multiple standing rest breaks needed this session, increased cues for avoiding obstacles in hall due to impaired vision, donned oclusion glasses during gait with pt reporting improvement without them on   Stairs             Wheelchair Mobility     Tilt Bed    Modified Rankin (Stroke Patients Only)       Balance Overall balance assessment: Needs assistance Sitting-balance support: Feet supported, No upper extremity supported Sitting balance-Leahy Scale: Fair     Standing balance support: During functional activity, Single extremity supported Standing balance-Leahy Scale: Poor Standing balance comment: reliant on ext support of HHA or leaning on sink during ADL                            Communication Communication Communication: Impaired Factors Affecting Communication: Reduced clarity of speech;Difficulty expressing self  Cognition Arousal: Alert Behavior During Therapy: WFL for tasks assessed/performed   PT - Cognitive impairments: Sequencing, Problem solving, Awareness, Initiation, Attention,  Safety/Judgement                       PT - Cognition Comments: pleasant and particapatory Following commands: Impaired Following commands impaired: Follows one step commands with increased time    Cueing Cueing Techniques: Gestural cues,  Verbal cues, Tactile cues  Exercises      General Comments        Pertinent Vitals/Pain Pain Assessment Pain Assessment: No/denies pain    Home Living                          Prior Function            PT Goals (current goals can now be found in the care plan section) Acute Rehab PT Goals PT Goal Formulation: With patient/family Time For Goal Achievement: 09/18/23 Progress towards PT goals: Progressing toward goals    Frequency    Min 1X/week      PT Plan      Co-evaluation              AM-PAC PT "6 Clicks" Mobility   Outcome Measure  Help needed turning from your back to your side while in a flat bed without using bedrails?: None Help needed moving from lying on your back to sitting on the side of a flat bed without using bedrails?: A Little Help needed moving to and from a bed to a chair (including a wheelchair)?: A Little Help needed standing up from a chair using your arms (e.g., wheelchair or bedside chair)?: A Little Help needed to walk in hospital room?: A Lot Help needed climbing 3-5 steps with a railing? : Total 6 Click Score: 16    End of Session Equipment Utilized During Treatment: Gait belt Activity Tolerance: Patient tolerated treatment well Patient left: with call bell/phone within reach;in bed;with family/visitor present (seated up EOB) Nurse Communication: Mobility status PT Visit Diagnosis: Unsteadiness on feet (R26.81);Other abnormalities of gait and mobility (R26.89);History of falling (Z91.81);Muscle weakness (generalized) (M62.81);Difficulty in walking, not elsewhere classified (R26.2);Other symptoms and signs involving the nervous system (R29.898);Hemiplegia and hemiparesis Hemiplegia - Right/Left: Left Hemiplegia - dominant/non-dominant: Non-dominant Hemiplegia - caused by: Cerebral infarction     Time: 1610-9604 PT Time Calculation (min) (ACUTE ONLY): 24 min  Charges:    $Gait Training: 23-37 mins PT General  Charges $$ ACUTE PT VISIT: 1 Visit                     Conswella Bruney R. PTA Acute Rehabilitation Services Office: [phone]   Catalina Antigua 09/17/2023, 4:10 PM

## 2023-09-17 NOTE — TOC Progression Note (Signed)
 Transition of Care [facility]) - Progression Note    Patient Details  Name: Craig Schmidt MRN: [id number] Date of Birth: 06/11/1971  Transition of Care [facility]) CM/SW Contact  Baldemar Lenis, [place] Phone Number: 09/17/2023, 2:06 PM  Clinical Narrative:   CSW sent request to Acute Rehab to evaluate for STAR program, as patient was not approved for CIR and does not have any benefits for SNF placement. CSW to follow.    Expected Discharge Plan: Home/Self Care Barriers to Discharge: Continued Medical Work up, Inadequate or no insurance  Expected Discharge Plan and Services       Living arrangements for the past 2 months: Apartment                                       Social Determinants of Health (SDOH) Interventions SDOH Screenings   Food Insecurity: Food Insecurity Present (09/04/2023)  Housing: High Risk (09/04/2023)  Transportation Needs: Unmet Transportation Needs (09/04/2023)  Utilities: Not At Risk (09/04/2023)  Recent Concern: Utilities - At Risk (08/12/2023)  Alcohol Screen: High Risk (09/19/2021)  Depression (PHQ2-9): High Risk (06/13/2022)  Financial Resource Strain: High Risk (07/19/2023)  Stress: Stress Concern Present (10/16/2022)  Tobacco Use: High Risk (09/04/2023)    Readmission Risk Interventions    07/12/2023    1:06 PM 06/28/2023    1:27 PM 06/21/2023   12:50 PM  Readmission Risk Prevention Plan  Transportation Screening Complete Complete Complete  PCP or Specialist Appt within 3-5 Days  Complete   HRI or Home Care Consult  Complete   Social Work Consult for Recovery Care Planning/Counseling  Complete   Palliative Care Screening  Not Applicable   Medication Review Oceanographer) Complete Complete Complete  PCP or Specialist appointment within 3-5 days of discharge Complete  Complete  HRI or Home Care Consult Complete  Complete  SW Recovery Care/Counseling Consult Complete  Complete  Palliative Care Screening Not Applicable  Not Applicable   Skilled Nursing Facility Not Applicable  Not Applicable

## 2023-09-17 NOTE — Progress Notes (Addendum)
 [facility]  PROGRESS NOTE  Craig Schmidt WUX:[id number] DOB: 06-Dec-1970 DOA: 09/03/2023 PCP: Rema Fendt, NP   Brief narrative:    Patient is a 53 year old male, past medical history significant for ICM, CAD, biventricular failure with an EF of less than 20%, moderate mitral stenosis, crack abuse, tobacco abuse, CKD II, homelessness, and history of CVA with residual right upper extremity weakness.  Patient presented to [facility] ED on 09/03/2023 with complaints of loss of vision, weakness, and inability to stand.  CT head was negative, and the patient left AMA. He was unable to move his left arm at this time, and this was pointed out to the patient. Ultimately he did come back to have the MRI which confirmed a stroke.  MRI demonstrated a large 3 cm right parietal and insular embolic infarct along with 3 small punctate infarcts in the right medial occipital lobes and left frontal vertex and left inferior cerebellum. CTA of the head/neck demonstrated a right MCA branch occlusion corresponding to the infarct.    Other significant prior history includes admission to [facility] 07/10/2023 for acute systolic heart failure, and 06/26/2023 for multifocal pneumonia with necrotizing component s/p BAL with negative bacterial culture and AFB/fungal cultures.    09/17/2023: Patient is awaiting disposition.  CIR seems to have turned down patient.  Input from the transition of care team is highly appreciated.   Assessment/Plan:   Acute CVA (cerebrovascular accident) (HCC)/concern for embolic etiology The patient has suffered a right parietal infact with a occlusion of the right MCA branch.  Seen by neurology. Counseled regarding his polysubstance abuse. Noted to be on aspirin and Eliquis.  Noted to be on statin. LDL noted to be 77.  He underwent recent echocardiogram in December which showed LVEF of less than 20%.  HbA1c was 5.6 in November. Seen by PT and OT.  Inpatient rehab insurance authorization is  currently in progress Started on gabapentin for neuropathic pain in his feet. 09/17/2023: Pursue disposition.   Dysphagia Seen by speech therapy.  Remains on heart healthy diet.      CKD stage IIIa -Creatinine 1.44 -Follow renal function in a.m.  Cocaine abuse (HCC) Pt has been advised that he must stop cocaine use to avoid catastrophic CVA in the future.   Essential hypertension - Hold lasix, entresto, aldactone as of 09/13/2023   HFrEF (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) EF less than 20%.  Patient continues to do illicit drugs especially cocaine.   Home medication list reviewed.  He is supposed to be on carvedilol, Jardiance, furosemide and Entresto along with spironolactone (held due to hypotension on 09/13/2023) -Consider restarting these medications as blood pressure improves  Hyperlipidemia Continue lipitor 40 mg daily.   Hypertension, malignant Monitor   Alcohol abuse Placed on CIWA protocol.  No evidence for withdrawal currently.  Continue thiamine multivitamins.  Awaiting bed at skilled nursing facility  Medications     apixaban  5 mg Oral BID   aspirin EC  81 mg Oral Daily   atorvastatin  40 mg Oral Daily   folic acid  1 mg Oral Daily   gabapentin  200 mg Oral BID   mirtazapine  15 mg Oral QHS   multivitamin with minerals  1 tablet Oral Daily   nicotine  14 mg Transdermal Daily   thiamine  100 mg Oral Daily   Or   thiamine  100 mg Intravenous Daily     Data Reviewed:   CBG:  Recent Labs  Lab  09/13/23 1239  GLUCAP 107*    SpO2: 98 %    Vitals:   09/16/23 2013 09/16/23 2331 09/17/23 0324 09/17/23 0802  BP: 117/87 114/82 (!) 125/97 (!) 134/103  Pulse: 97 (!) 104 (!) 104 100  Resp: 18 18 18    Temp: 97.8 F (36.6 C) 98 F (36.7 C) 97.8 F (36.6 C) (!) 97.4 F (36.3 C)  TempSrc: Oral Oral Oral Oral  SpO2: 99% 100% 100% 98%  Weight:      Height:          Data Reviewed:  Basic Metabolic Panel: Recent Labs  Lab  09/12/23 0650 09/13/23 0551 09/14/23 0635 09/15/23 0631 09/16/23 0655  NA 135 135 138 136 135  K 4.5 4.4 4.4 4.7 4.9  CL 103 100 103 105 105  CO2 24 22 24 24 22   GLUCOSE 104* 107* 112* 103* 104*  BUN 20 26* 27* 34* 31*  CREATININE 1.54* 1.48* 1.60* 1.44* 1.36*  CALCIUM 9.0 9.2 9.0 9.1 9.3  MG 2.0  --  2.3  --   --   PHOS  --   --  4.7*  --   --     CBC: Recent Labs  Lab 09/11/23 0527 09/14/23 0635  WBC 8.7 10.2  HGB 11.7* 12.8*  HCT 37.5* 41.6  MCV 89.7 89.5  PLT 301 421*    LFT Recent Labs  Lab 09/15/23 0631  AST 42*  ALT 17  ALKPHOS 96  BILITOT 0.5  PROT 6.8  ALBUMIN 2.9*     Antibiotics: Anti-infectives (From admission, onward)    None        DVT prophylaxis: Apixaban  Code Status: Full code  Family Communication: No family at bedside   CONSULTS neurology   Subjective    No new complaints.  Objective    Physical Examination: General condition: Patient is not in any distress.  Patient remains significantly ataxic.   HEENT: Mild pallor. Neck: Supple. CVs: S1-S2. Abdomen: Soft and nontender. Neuro: Ataxic. Extremities: No leg edema.  Status is: Inpatient:    Time spent: 35 minutes.  Barnetta Chapel   [facility] If 7PM-7AM, please contact night-coverage at [email or web address], Office  [phone]   09/17/2023, 9:47 AM  LOS: 13 days

## 2023-09-18 ENCOUNTER — Telehealth: Payer: Self-pay | Admitting: Family

## 2023-09-18 DIAGNOSIS — I639 Cerebral infarction, unspecified: Secondary | ICD-10-CM | POA: Diagnosis not present

## 2023-09-18 NOTE — Plan of Care (Signed)
  Problem: Self-Care: Goal: Ability to participate in self-care as condition permits will improve Outcome: Progressing Goal: Verbalization of feelings and concerns over difficulty with self-care will improve Outcome: Progressing Goal: Ability to communicate needs accurately will improve Outcome: Progressing   Problem: Clinical Measurements: Goal: Ability to maintain clinical measurements within normal limits will improve Outcome: Progressing Goal: Will remain free from infection Outcome: Progressing Goal: Diagnostic test results will improve Outcome: Progressing Goal: Respiratory complications will improve Outcome: Progressing Goal: Cardiovascular complication will be avoided Outcome: Progressing   Problem: Nutrition: Goal: Adequate nutrition will be maintained Outcome: Progressing   Problem: Activity: Goal: Risk for activity intolerance will decrease Outcome: Progressing   Problem: Pain Managment: Goal: General experience of comfort will improve and/or be controlled Outcome: Progressing   Problem: Safety: Goal: Ability to remain free from injury will improve Outcome: Progressing   Problem: Skin Integrity: Goal: Risk for impaired skin integrity will decrease Outcome: Progressing   Problem: Elimination: Goal: Will not experience complications related to bowel motility Outcome: Progressing Goal: Will not experience complications related to urinary retention Outcome: Progressing
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
## 2023-09-18 NOTE — TOC Progression Note (Signed)
 Transition of Care [facility]) - Progression Note    Patient Details  Name: Craig Schmidt MRN: [id number] Date of Birth: Mar 19, 1971  Transition of Care [facility]) CM/SW Contact  Baldemar Lenis, [place] Phone Number: 09/18/2023, 11:23 AM  Clinical Narrative:   CSW updated that STAR program is currently full, but acute rehab will maximize sessions for patient and will follow for any availability with STAR program.   CSW spoke with [facility] to verify still no beds available for Medicaid, and they are still unable to offer a bed for the patient due to his payor source.   CSW met with patient and community support at bedside to discuss discharge needs. Patent has primary care appointment scheduled for March 12 if he is discharged by that point, but needs PCA form sent in to work on caregiver help at home. Form was faxed back to Inetta Fermo to assist with arranging PCA services. CSW to follow.    Expected Discharge Plan: Home/Self Care Barriers to Discharge: Continued Medical Work up, Inadequate or no insurance  Expected Discharge Plan and Services       Living arrangements for the past 2 months: Apartment                                       Social Determinants of Health (SDOH) Interventions SDOH Screenings   Food Insecurity: Food Insecurity Present (09/04/2023)  Housing: High Risk (09/04/2023)  Transportation Needs: Unmet Transportation Needs (09/04/2023)  Utilities: Not At Risk (09/04/2023)  Recent Concern: Utilities - At Risk (08/12/2023)  Alcohol Screen: High Risk (09/19/2021)  Depression (PHQ2-9): High Risk (06/13/2022)  Financial Resource Strain: High Risk (07/19/2023)  Stress: Stress Concern Present (10/16/2022)  Tobacco Use: High Risk (09/04/2023)    Readmission Risk Interventions    07/12/2023    1:06 PM 06/28/2023    1:27 PM 06/21/2023   12:50 PM  Readmission Risk Prevention Plan  Transportation Screening Complete Complete Complete  PCP or Specialist  Appt within 3-5 Days  Complete   HRI or Home Care Consult  Complete   Social Work Consult for Recovery Care Planning/Counseling  Complete   Palliative Care Screening  Not Applicable   Medication Review Oceanographer) Complete Complete Complete  PCP or Specialist appointment within 3-5 days of discharge Complete  Complete  HRI or Home Care Consult Complete  Complete  SW Recovery Care/Counseling Consult Complete  Complete  Palliative Care Screening Not Applicable  Not Applicable  Skilled Nursing Facility Not Applicable  Not Applicable

## 2023-09-18 NOTE — Telephone Encounter (Signed)
 Schedule hospital follow-up appointment.

## 2023-09-18 NOTE — Progress Notes (Signed)
 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  Patient presented to [facility] ED on 09/03/2023 with complaints of loss of vision, weakness, and inability to stand.  CT head was negative, Patient has  left AMA. He was unable to move his left arm later that time,. Ultimately he did come back to have MRI which confirmed a stroke.  MRI demonstrated a large 3 cm right parietal and insular embolic infarct along with 3 small punctate infarcts in the right medial occipital lobes and left frontal vertex and left inferior cerebellum. CTA of the head/neck demonstrated a right MCA branch occlusion corresponding to the infarct.     He was admitted to [facility] on 07/10/2023 for acute systolic heart failure, and 07/16/2023 for multifocal pneumonia with necrotizing component s/p BAL with negative bacterial culture and AFB/fungal cultures.     Patient is now medically clear, awaiting disposition.  CIR declined, TOC working on finding placement.   Assessment & Plan:   Principal Problem:   Acute CVA (cerebrovascular accident) [facility]) Active Problems:   Homeless   Coronary artery disease   Cocaine abuse (HCC)   Alcohol abuse   Essential hypertension   Hyperlipidemia   Malnutrition of moderate degree   History of CVA (cerebrovascular accident)   Hypertension, malignant   HFrEF (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)  Acute CVA, concern for embolic etiology: Patient has suffered right parietal infact with occlusion of the right MCA branch.  He was evaluated by neurology. He was counseled regarding his polysubstance abuse. Continue aspirin and Eliquis.  Noted to be  on statin. LDL noted to be 77.  He underwent recent echocardiogram in December which showed LVEF of less than 20%.  HbA1c was 5.6 in November. Seen by PT and OT.  Inpatient rehab insurance authorization is currently in progress/ Started on gabapentin for neuropathic pain in his feet. 09/17/2023: Awaiting disposition.  Neurology signed off.   Dysphagia: Seen by speech therapy.  Remains on heart healthy diet.     CKD stage IIIa: Baseline Creatinine 1.44 Avoid nephrotoxic medications,  monitor serum creatinine.   Cocaine abuse [facility]): He has been advised that he must stop cocaine use to avoid catastrophic CVA in the future.   Essential hypertension: Hold lasix, entresto, aldactone as of 09/13/2023.   HFrEF (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) LVEF less than 20%.  Patient continues to do illicit drugs especially cocaine.   Home medication list reviewed.  He is supposed to be on carvedilol, Jardiance, furosemide and Entresto along with spironolactone (held due to hypotension on 09/13/2023) Consider restarting these medications as blood pressure improves.   Hyperlipidemia: Continue lipitor 40 mg daily.   Alcohol abuse: Continue CIWA protocol.  No evidence for withdrawal currently.   Continue thiamine multivitamins. Awaiting bed at skilled nursing facility   DVT prophylaxis: Eliquis Code Status: Full code Family Communication: Girl Friend at bedside Disposition Plan:    Status is: Inpatient Remains inpatient appropriate because: Severity of illness.    Consultants:  Neurology  Procedures:  Antimicrobials:  Anti-infectives (From admission, onward)    None       Subjective: Patient was seen and examined at bedside.  Overnight events noted.   Patient reports doing better.  He was having breakfast.  Girlfriend is at bedside.  Objective: Vitals:   09/17/23 2339 09/18/23 0341 09/18/23 0844 09/18/23 1200  BP: 132/89 (!) 124/90 (!) 134/101 125/89  Pulse: 95  93 98 (!) 103  Resp: 17 19  17   Temp: (!) 97.4 F (36.3 C) 98.2 F (36.8 C) 97.7 F (36.5 C) 98.2 F (36.8 C)  TempSrc: Oral Oral Oral Axillary  SpO2: 100% 100% 99% 100%  Weight:      Height:        Intake/Output Summary (Last 24 hours) at 09/18/2023 1203 Last data filed at 09/18/2023 0846 Gross per 24 hour  Intake --  Output 1300 ml  Net -1300 ml   Filed Weights   09/04/23 1017  Weight: 68 kg    Examination:  General exam: Appears calm and comfortable, deconditioned, not in any acute distress. Respiratory system: Clear to auscultation. Respiratory effort normal.  RR 15 Cardiovascular system: S1 & S2 heard, RRR. No JVD, murmurs, rubs, gallops or clicks.  Gastrointestinal system: Abdomen is non distended, soft and non tender.  Normal bowel sounds heard. Central nervous system: Alert and oriented x 3.  Right-sided weakness. Extremities: No edema, no cyanosis, no clubbing Skin: No rashes, lesions or ulcers Psychiatry: Judgement and insight appear normal. Mood & affect appropriate.     Data Reviewed: I have personally reviewed following labs and imaging studies  CBC: Recent Labs  Lab 09/14/23 0635  WBC 10.2  HGB 12.8*  HCT 41.6  MCV 89.5  PLT 421*   Basic Metabolic Panel: Recent Labs  Lab 09/12/23 0650 09/13/23 0551 09/14/23 0635 09/15/23 0631 09/16/23 0655  NA 135 135 138 136 135  K 4.5 4.4 4.4 4.7 4.9  CL 103 100 103 105 105  CO2 24 22 24 24 22   GLUCOSE 104* 107* 112* 103* 104*  BUN 20 26* 27* 34* 31*  CREATININE 1.54* 1.48* 1.60* 1.44* 1.36*  CALCIUM 9.0 9.2 9.0 9.1 9.3  MG 2.0  --  2.3  --   --   PHOS  --   --  4.7*  --   --    GFR: Estimated Creatinine Clearance: 60.1 mL/min (A) (by C-G formula based on SCr of 1.36 mg/dL (H)). Liver Function Tests: Recent Labs  Lab 09/15/23 0631  AST 42*  ALT 17  ALKPHOS 96  BILITOT 0.5  PROT 6.8  ALBUMIN 2.9*   No results for input(s): "LIPASE", "AMYLASE" in the last 168 hours. No results for  input(s): "AMMONIA" in the last 168 hours. Coagulation Profile: No results for input(s): "INR", "PROTIME" in the last 168 hours. Cardiac Enzymes: No results for input(s): "CKTOTAL", "CKMB", "CKMBINDEX", "TROPONINI" in the last 168 hours. BNP (last 3 results) No results for input(s): "PROBNP" in the last 8760 hours. HbA1C: No results for input(s): "HGBA1C" in the last 72 hours. CBG: Recent Labs  Lab 09/13/23 1239  GLUCAP 107*   Lipid Profile: No results for input(s): "CHOL", "HDL", "LDLCALC", "TRIG", "CHOLHDL", "LDLDIRECT" in the last 72 hours. Thyroid Function Tests: No results for input(s): "TSH", "T4TOTAL", "FREET4", "T3FREE", "THYROIDAB" in the last 72 hours. Anemia Panel: No results for input(s): "VITAMINB12", "FOLATE", "FERRITIN", "TIBC", "IRON", "RETICCTPCT" in the last 72 hours. Sepsis Labs: No results for input(s): "PROCALCITON", "LATICACIDVEN" in the last 168 hours.  No results found for this or any previous visit (from the past 240 hours).   Radiology Studies: No results found.  Scheduled Meds:  apixaban  5 mg Oral BID   aspirin EC  81 mg Oral Daily   atorvastatin  40 mg Oral Daily   folic acid  1 mg Oral Daily   gabapentin  200 mg Oral BID   mirtazapine  15 mg Oral QHS   multivitamin with minerals  1 tablet Oral Daily   nicotine  14 mg Transdermal Daily   thiamine  100 mg Oral Daily   Or   thiamine  100 mg Intravenous Daily   Continuous Infusions:   LOS: 14 days    Time spent: 50 mins    Willeen Niece, MD [facility]   If 7PM-7AM, please contact night-coverage

## 2023-09-18 NOTE — Progress Notes (Signed)
 Occupational Therapy Treatment Patient Details Name: Craig Schmidt MRN: [id number] DOB: 02/07/1971 Today's Date: 09/18/2023   History of present illness 53 y.o. male presenting to ED 2/12 with L sided weakness and confusion. MRI found a right parietal infarct as well as other small punctate infarcts. 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   OT comments  Progressing toward goals.  Patient transferred from [facility] to bed with mod A using HHA.  Patient attempted to sit to soon requiring more assist today.  Sat EOB for 30 minutes with min cues for anterior pelvic tilt and thoracic extension. Visual eval completed - able to fuse vision in one area R of midline but unable to sustain.  Taping is not assisting with reducing the double vision input.  Worked on eye exercises to improve fusion - patient fatiguing with a few reps.  Will continue to benefit from OT services to facilitate d/c      If plan is discharge home, recommend the following:  A lot of help with walking and/or transfers;A lot of help with bathing/dressing/bathroom;Assistance with cooking/housework;Direct supervision/assist for medications management;Assist for transportation;Two people to help with walking and/or transfers;Two people to help with bathing/dressing/bathroom;Direct supervision/assist for financial management;Help with stairs or ramp for entrance   Equipment Recommendations  None recommended by OT    Recommendations for Other Services      Precautions / Restrictions Precautions Precautions: Fall Precaution/Restrictions Comments: apraxic and double vision Restrictions Weight Bearing Restrictions Per Provider Order: No       Mobility Bed Mobility Overal bed mobility: Needs Assistance Bed Mobility: Supine to Sit, Sit to Supine     Supine to sit: Contact guard Sit to supine: Min  assist   General bed mobility comments: Good use of LUE for boosting in bed    Transfers                         Balance                                           ADL either performed or assessed with clinical judgement   ADL                           Toilet Transfer: Moderate assistance;Stand-pivot;BSC/3in1 (Started sitting before fully turned to bed, increasing amount of assistance today)                  Extremity/Trunk Assessment Upper Extremity Assessment Upper Extremity Assessment: LUE deficits/detail;RUE deficits/detail RUE Deficits / Details: moderate increased tone, weak grasp and poor FMC LUE Deficits / Details: Able to use Lue to WB and push to reposition in bed - movement is apraxic   Lower Extremity Assessment Lower Extremity Assessment: Defer to PT evaluation        Vision   Vision Assessment?: Yes Eye Alignment: Impaired (comment) (Eyes are cloudy and R eye elevated compared to L) Ocular Range of Motion: Restricted looking up (L<R) Alignment/Gaze Preference: Chin down Tracking/Visual Pursuits: Decreased smoothness of horizontal tracking;Decreased smoothness of vertical tracking;Requires cues, head turns, or add eye shifts to track;Unable to hold eye position out of midline (Tending to close eyes during testing ataxic eye movement noted with  tracking) Visual Fields: Impaired-to be further tested in functional context (patient with delay in finding stimulus on the L - not consistent during testing) Diplopia Assessment: Objects split side to side;Present all the time/all directions (Able to find one spot with fusion - just r of midline in lower quadrant) Additional Comments: Patient stated that the taped glases did not work for him - used his glasses for assessment   Perception     Praxis Praxis Praxis: Impaired Praxis Impairment Details: Limb apraxia   Communication Communication Communication: Impaired Factors  Affecting Communication: Reduced clarity of speech;Difficulty expressing self   Cognition Arousal: Alert Behavior During Therapy: WFL for tasks assessed/performed Cognition: Cognition impaired                               Following commands: Impaired Following commands impaired: Follows multi-step commands with increased time      Cueing   Cueing Techniques: Gestural cues, Verbal cues, Tactile cues  Exercises      Shoulder Instructions       General Comments      Pertinent Vitals/ Pain       Pain Assessment Pain Assessment: No/denies pain  Home Living                                          Prior Functioning/Environment              Frequency  Min 1X/week        Progress Toward Goals  OT Goals(current goals can now be found in the care plan section)  Progress towards OT goals: Progressing toward goals  Acute Rehab OT Goals Patient Stated Goal: I want to get better OT Goal Formulation: With patient/family Time For Goal Achievement: 10/02/23 ADL Goals Pt Will Perform Eating: with set-up;sitting;with adaptive utensils Pt Will Perform Grooming: sitting;with adaptive equipment;with min assist Pt Will Perform Upper Body Bathing: sitting;with min assist;with adaptive equipment Pt Will Transfer to Toilet: ambulating;with contact guard assist Pt/caregiver will Perform Home Exercise Program: Increased strength;Increased ROM;Left upper extremity;With written HEP provided Additional ADL Goal #2: Pt will utilize compensatory strategies to locate 3/3 objects on his L side for ADLs  Plan      Co-evaluation                 AM-PAC OT "6 Clicks" Daily Activity     Outcome Measure   Help from another person eating meals?: A Lot Help from another person taking care of personal grooming?: A Lot Help from another person toileting, which includes using toliet, bedpan, or urinal?: Total Help from another person bathing (including  washing, rinsing, drying)?: A Lot Help from another person to put on and taking off regular upper body clothing?: Total Help from another person to put on and taking off regular lower body clothing?: Total 6 Click Score: 9    End of Session    OT Visit Diagnosis: Muscle weakness (generalized) (M62.81);Unsteadiness on feet (R26.81);Other abnormalities of gait and mobility (R26.89);Hemiplegia and hemiparesis;Cognitive communication deficit (R41.841) Symptoms and signs involving cognitive functions: Cerebral infarction Hemiplegia - Right/Left: Left Hemiplegia - caused by: Cerebral infarction   Activity Tolerance Patient tolerated treatment well   Patient Left in bed;with call bell/phone within reach;with bed alarm set   Nurse Communication          Time: 1430-1510  OT Time Calculation (min): 40 min  Charges: OT General Charges $OT Visit: 1 Visit OT Treatments $Therapeutic Activity: 38-52 mins  Hal Neer OTR/L   Malachi Bonds 09/18/2023, 3:43 PM

## 2023-09-18 NOTE — Telephone Encounter (Signed)
 Scheduled for HFU on 03/12

## 2023-09-19 DIAGNOSIS — I639 Cerebral infarction, unspecified: Secondary | ICD-10-CM | POA: Diagnosis not present

## 2023-09-19 LAB — CBC
HCT: 36.4 % — ABNORMAL LOW (ref 39.0–52.0)
Hemoglobin: 11.4 g/dL — ABNORMAL LOW (ref 13.0–17.0)
MCH: 27.9 pg (ref 26.0–34.0)
MCHC: 31.3 g/dL (ref 30.0–36.0)
MCV: 89 fL (ref 80.0–100.0)
Platelets: 364 10*3/uL (ref 150–400)
RBC: 4.09 MIL/uL — ABNORMAL LOW (ref 4.22–5.81)
RDW: 15.1 % (ref 11.5–15.5)
WBC: 7.4 10*3/uL (ref 4.0–10.5)
nRBC: 0 % (ref 0.0–0.2)

## 2023-09-19 LAB — BASIC METABOLIC PANEL
Anion gap: 9 (ref 5–15)
BUN: 29 mg/dL — ABNORMAL HIGH (ref 6–20)
CO2: 21 mmol/L — ABNORMAL LOW (ref 22–32)
Calcium: 9.4 mg/dL (ref 8.9–10.3)
Chloride: 109 mmol/L (ref 98–111)
Creatinine, Ser: 1.26 mg/dL — ABNORMAL HIGH (ref 0.61–1.24)
GFR, Estimated: >60 mL/min (ref >60)
Glucose, Bld: 104 mg/dL — ABNORMAL HIGH (ref 70–99)
Potassium: 4.8 mmol/L (ref 3.5–5.1)
Sodium: 139 mmol/L (ref 135–145)

## 2023-09-19 LAB — PHOSPHORUS: Phosphorus: 5.1 mg/dL — ABNORMAL HIGH (ref 2.5–4.6)

## 2023-09-19 LAB — MAGNESIUM: Magnesium: 2 mg/dL (ref 1.7–2.4)

## 2023-09-19 NOTE — Plan of Care (Signed)
  Problem: Health Behavior/Discharge Planning: Goal: Ability to manage health-related needs will improve Outcome: Progressing Goal: Goals will be collaboratively established with patient/family Outcome: Progressing   Problem: Nutrition: Goal: Risk of aspiration will decrease Outcome: Progressing Goal: Dietary intake will improve Outcome: Progressing   Problem: Clinical Measurements: Goal: Ability to maintain clinical measurements within normal limits will improve Outcome: Progressing Goal: Will remain free from infection Outcome: Progressing Goal: Diagnostic test results will improve Outcome: Progressing Goal: Respiratory complications will improve Outcome: Progressing Goal: Cardiovascular complication will be avoided Outcome: Progressing   Problem: Activity: Goal: Risk for activity intolerance will decrease Outcome: Progressing   Problem: Elimination: Goal: Will not experience complications related to bowel motility Outcome: Progressing Goal: Will not experience complications related to urinary retention Outcome: Progressing   Problem: Safety: Goal: Ability to remain free from injury will improve Outcome: Progressing   Problem: Skin Integrity: Goal: Risk for impaired skin integrity will decrease Outcome: Progressing

## 2023-09-19 NOTE — Plan of Care (Signed)
 Appears to be in good spirits today.  Talkative with nursing staff and with signif other.  Has walked in hallway today and has even walked in room.     Problem: Nutrition: Goal: Risk of aspiration will decrease Outcome: Progressing   Problem: Nutrition: Goal: Dietary intake will improve Outcome: Progressing   Problem: Clinical Measurements: Goal: Will remain free from infection Outcome: Progressing   Problem: Clinical Measurements: Goal: Respiratory complications will improve Outcome: Progressing   Problem: Activity: Goal: Risk for activity intolerance will decrease Outcome: Progressing   Problem: Coping: Goal: Level of anxiety will decrease Outcome: Progressing

## 2023-09-19 NOTE — Progress Notes (Signed)
 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  Patient presented to [facility] ED on 09/03/2023 with complaints of loss of vision, weakness, and inability to stand.  CT head was negative, Patient has  left AMA. He was unable to move his left arm later that time,. Ultimately he did come back to have MRI which confirmed a stroke.  MRI demonstrated a large 3 cm right parietal and insular embolic infarct along with 3 small punctate infarcts in the right medial occipital lobes and left frontal vertex and left inferior cerebellum. CTA of the head/neck demonstrated a right MCA branch occlusion corresponding to the infarct.     He was admitted to [facility] on 07/10/2023 for acute systolic heart failure, and 07/16/2023 for multifocal pneumonia with necrotizing component s/p BAL with negative bacterial culture and AFB/fungal cultures.     Patient is now medically clear, awaiting disposition.  CIR declined, TOC working on finding placement.   Assessment & Plan:   Principal Problem:   Acute CVA (cerebrovascular accident) [facility]) Active Problems:   Homeless   Coronary artery disease   Cocaine abuse (HCC)   Alcohol abuse   Essential hypertension   Hyperlipidemia   Malnutrition of moderate degree   History of CVA (cerebrovascular accident)   Hypertension, malignant   HFrEF (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)  Acute CVA, concern for embolic etiology: Patient has suffered right parietal infact with occlusion of the right MCA branch.  He was evaluated by neurology. He was counseled regarding his polysubstance abuse. Continue aspirin and Eliquis.  Noted to be  on statin. LDL noted to be 77.  He underwent recent echocardiogram in December which showed LVEF of less than 20%.  HbA1c was 5.6 in November. Seen by PT and OT.  Inpatient rehab insurance authorization is currently in progress Started on gabapentin for neuropathic pain in his feet. 09/17/2023: Awaiting disposition.  Neurology signed off.   Dysphagia: Seen by speech therapy.  Remains on heart healthy diet.     CKD stage IIIa: Baseline Creatinine 1.44 Avoid nephrotoxic medications,  monitor serum creatinine.   Cocaine abuse [facility]): He has been advised that he must stop cocaine use to avoid catastrophic CVA in the future.   Essential hypertension: Hold lasix, entresto, aldactone as of 09/13/2023.   HFrEF (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) LVEF less than 20%.  Patient continues to do illicit drugs especially cocaine.   Home medication list reviewed.  He is supposed to be on carvedilol, Jardiance, furosemide and Entresto along with spironolactone (held due to hypotension on 09/13/2023) Consider restarting these medications as blood pressure improves.   Hyperlipidemia: Continue lipitor 40 mg daily.   Alcohol abuse: Continue CIWA protocol.  No evidence for withdrawal currently.   Continue thiamine multivitamins. Awaiting bed at skilled nursing facility   DVT prophylaxis: Eliquis Code Status: Full code Family Communication: Girl Friend at bedside Disposition Plan:    Status is: Inpatient Remains inpatient appropriate because: Severity of illness.    Consultants:  Neurology  Procedures:  Antimicrobials:  Anti-infectives (From admission, onward)    None       Subjective: Patient was seen and examined at bedside.  Overnight events noted.   Patient reports feeling better,  he was having breakfast,  girlfriend is at bedside.  Objective: Vitals:   09/18/23 2359 09/19/23 0350 09/19/23 0850 09/19/23 1221  BP: (!) 130/95 (!) 127/92 (!) 141/110 (!) 133/97   Pulse: 98 93 94 96  Resp: 16 17 18 18   Temp: 97.8 F (36.6 C) 97.7 F (36.5 C) 98 F (36.7 C) 98.2 F (36.8 C)  TempSrc: Oral Oral Oral Oral  SpO2: 100% 98% 100% 100%  Weight:      Height:        Intake/Output Summary (Last 24 hours) at 09/19/2023 1335 Last data filed at 09/19/2023 0850 Gross per 24 hour  Intake --  Output 1100 ml  Net -1100 ml   Filed Weights   09/04/23 1017  Weight: 68 kg    Examination:  General exam: Appears comfortable, deconditioned, not in any acute distress. Respiratory system: Clear to auscultation. Respiratory effort normal.  RR 15 Cardiovascular system: S1 & S2 heard, RRR. No JVD, murmurs, rubs, gallops or clicks.  Gastrointestinal system: Abdomen is non distended, soft and non tender.  Normal bowel sounds heard. Central nervous system: Alert and oriented x 3.  Right-sided weakness. Extremities: No edema, no cyanosis, no clubbing Skin: No rashes, lesions or ulcers Psychiatry: Judgement and insight appear normal. Mood & affect appropriate.     Data Reviewed: I have personally reviewed following labs and imaging studies  CBC: Recent Labs  Lab 09/14/23 0635 09/19/23 0627  WBC 10.2 7.4  HGB 12.8* 11.4*  HCT 41.6 36.4*  MCV 89.5 89.0  PLT 421* 364   Basic Metabolic Panel: Recent Labs  Lab 09/13/23 0551 09/14/23 0635 09/15/23 0631 09/16/23 0655 09/19/23 0627  NA 135 138 136 135 139  K 4.4 4.4 4.7 4.9 4.8  CL 100 103 105 105 109  CO2 22 24 24 22  21*  GLUCOSE 107* 112* 103* 104* 104*  BUN 26* 27* 34* 31* 29*  CREATININE 1.48* 1.60* 1.44* 1.36* 1.26*  CALCIUM 9.2 9.0 9.1 9.3 9.4  MG  --  2.3  --   --  2.0  PHOS  --  4.7*  --   --  5.1*   GFR: Estimated Creatinine Clearance: 64.8 mL/min (A) (by C-G formula based on SCr of 1.26 mg/dL (H)). Liver Function Tests: Recent Labs  Lab 09/15/23 0631  AST 42*  ALT 17  ALKPHOS 96  BILITOT 0.5  PROT 6.8  ALBUMIN 2.9*   No results for input(s): "LIPASE", "AMYLASE" in the last 168  hours. No results for input(s): "AMMONIA" in the last 168 hours. Coagulation Profile: No results for input(s): "INR", "PROTIME" in the last 168 hours. Cardiac Enzymes: No results for input(s): "CKTOTAL", "CKMB", "CKMBINDEX", "TROPONINI" in the last 168 hours. BNP (last 3 results) No results for input(s): "PROBNP" in the last 8760 hours. HbA1C: No results for input(s): "HGBA1C" in the last 72 hours. CBG: Recent Labs  Lab 09/13/23 1239  GLUCAP 107*   Lipid Profile: No results for input(s): "CHOL", "HDL", "LDLCALC", "TRIG", "CHOLHDL", "LDLDIRECT" in the last 72 hours. Thyroid Function Tests: No results for input(s): "TSH", "T4TOTAL", "FREET4", "T3FREE", "THYROIDAB" in the last 72 hours. Anemia Panel: No results for input(s): "VITAMINB12", "FOLATE", "FERRITIN", "TIBC", "IRON", "RETICCTPCT" in the last 72 hours. Sepsis Labs: No results for input(s): "PROCALCITON", "LATICACIDVEN" in the last 168 hours.  No results found for this or any previous visit (from the past 240 hours).   Radiology Studies: No results found.  Scheduled  Meds:  apixaban  5 mg Oral BID   aspirin EC  81 mg Oral Daily   atorvastatin  40 mg Oral Daily   folic acid  1 mg Oral Daily   gabapentin  200 mg Oral BID   mirtazapine  15 mg Oral QHS   multivitamin with minerals  1 tablet Oral Daily   nicotine  14 mg Transdermal Daily   thiamine  100 mg Oral Daily   Or   thiamine  100 mg Intravenous Daily   Continuous Infusions:   LOS: 15 days    Time spent: 35 mins    Willeen Niece, MD [facility]   If 7PM-7AM, please contact night-coverage

## 2023-09-19 NOTE — Progress Notes (Signed)
 Physical Therapy Treatment Patient Details Name: Craig Schmidt MRN: [id number] DOB: 1971/04/10 Today's Date: 09/19/2023   History of Present Illness 53 y.o. male presenting to ED 2/12 with L sided weakness and confusion. MRI found a right parietal infarct as well as other small punctate infarcts. 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    PT Comments  Pt resting in bed on arrival and agreeable to session with continued good progress towards acute goals. Pt able to progress gait this session with personal cane and grossly CGA for safety. Increased time spent at start of session to find optimal hand placement and grasp on cane with pt ultimately able to maintain grasp on cane upright throughout gait vs cane handle. Pt requiring cues throughout gait for optimal can sequencing with pt able to correct. Pt with continues to demonstrate posterior lean during all standing mobility but without LOB. Pt continues to benefit from skilled PT services to progress toward functional mobility goals.     If plan is discharge home, recommend the following: A lot of help with walking and/or transfers;A lot of help with bathing/dressing/bathroom;Assistance with cooking/housework;Direct supervision/assist for medications management;Direct supervision/assist for financial management;Assist for transportation;Help with stairs or ramp for entrance;Supervision due to cognitive status   Can travel by private vehicle        Equipment Recommendations  Rollator (4 wheels);BSC/3in1    Recommendations for Other Services       Precautions / Restrictions Precautions Precautions: Fall Precaution/Restrictions Comments: apraxic and double vision Restrictions Weight Bearing Restrictions Per Provider Order: No     Mobility  Bed Mobility Overal bed mobility: Needs Assistance Bed Mobility:  Supine to Sit     Supine to sit: Contact guard     General bed mobility comments: Good use of LUE for boosting in bed    Transfers Overall transfer level: Needs assistance Equipment used: 1 person hand held assist, Straight cane Transfers: Sit to/from Stand Sit to Stand: Min assist           General transfer comment: min A to boost up to standing and stabilize    Ambulation/Gait Ambulation/Gait assistance: Contact guard assist, Min assist Gait Distance (Feet): 175 Feet Assistive device: Straight cane Gait Pattern/deviations: Decreased stride length, Ataxic, Step-through pattern, Leaning posteriorly, Wide base of support Gait velocity: slowed     General Gait Details: wide BOS with slight posterior lean, gorssly CGA with support of personal cane, some light assist initially to steady and during turning but no overt LOB noted   Stairs             Wheelchair Mobility     Tilt Bed    Modified Rankin (Stroke Patients Only)       Balance Overall balance assessment: Needs assistance Sitting-balance support: Feet supported, No upper extremity supported Sitting balance-Leahy Scale: Fair Sitting balance - Comments: requires outside assist to maintain Postural control: Posterior lean Standing balance support: During functional activity, Single extremity supported Standing balance-Leahy Scale: Poor Standing balance comment: realint on single UE support                            Communication Communication Communication: Impaired Factors Affecting Communication: Reduced clarity of speech;Difficulty expressing self  Cognition Arousal: Alert Behavior During Therapy: WFL for tasks assessed/performed   PT - Cognitive impairments: Sequencing, Problem solving,  Awareness, Initiation, Attention, Safety/Judgement                       PT - Cognition Comments: pleasant and particapatory Following commands: Impaired Following commands impaired:  Follows multi-step commands with increased time    Cueing Cueing Techniques: Gestural cues, Verbal cues, Tactile cues  Exercises      General Comments General comments (skin integrity, edema, etc.): pt significant other [name] present in room during session      Pertinent Vitals/Pain Pain Assessment Pain Assessment: No/denies pain Pain Intervention(s): Monitored during session    Home Living                          Prior Function            PT Goals (current goals can now be found in the care plan section) Acute Rehab PT Goals PT Goal Formulation: With patient/family Time For Goal Achievement: 09/18/23 Progress towards PT goals: Progressing toward goals    Frequency    Min 1X/week      PT Plan      Co-evaluation              AM-PAC PT "6 Clicks" Mobility   Outcome Measure  Help needed turning from your back to your side while in a flat bed without using bedrails?: None Help needed moving from lying on your back to sitting on the side of a flat bed without using bedrails?: A Little Help needed moving to and from a bed to a chair (including a wheelchair)?: A Little Help needed standing up from a chair using your arms (e.g., wheelchair or bedside chair)?: A Little Help needed to walk in hospital room?: A Little Help needed climbing 3-5 steps with a railing? : Total 6 Click Score: 17    End of Session Equipment Utilized During Treatment: Gait belt Activity Tolerance: Patient tolerated treatment well Patient left: with call bell/phone within reach;in bed;with family/visitor present;with bed alarm set (seated up EOB) Nurse Communication: Mobility status PT Visit Diagnosis: Unsteadiness on feet (R26.81);Other abnormalities of gait and mobility (R26.89);History of falling (Z91.81);Muscle weakness (generalized) (M62.81);Difficulty in walking, not elsewhere classified (R26.2);Other symptoms and signs involving the nervous system (R29.898);Hemiplegia and  hemiparesis Hemiplegia - Right/Left: Left Hemiplegia - dominant/non-dominant: Non-dominant Hemiplegia - caused by: Cerebral infarction     Time: 1610-9604 PT Time Calculation (min) (ACUTE ONLY): 23 min  Charges:    $Gait Training: 23-37 mins PT General Charges $$ ACUTE PT VISIT: 1 Visit                     Dwyane Dupree R. PTA Acute Rehabilitation Services Office: [phone]   Catalina Antigua 09/19/2023, 4:32 PM

## 2023-09-20 DIAGNOSIS — I639 Cerebral infarction, unspecified: Secondary | ICD-10-CM | POA: Diagnosis not present

## 2023-09-20 NOTE — Discharge Summary (Signed)
 Physician Discharge Summary  Craig Schmidt QQV:[id number] DOB: 08-29-1970 DOA: 09/03/2023  PCP: Rema Fendt, NP  Admit date: 09/03/2023  Discharge date: 09/20/2023  Admitted From:  Home.  Disposition:  Left against Medical Advice.  Recommendations for Outpatient Follow-up:  Follow up with PCP in 1-2 weeks. Please obtain BMP/CBC in one week. Left against medical advice.  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  Patient presented to [facility] ED on 09/03/2023 with complaints of loss of vision, weakness, and inability to stand.  CT head was negative, Patient has left AMA. He was unable to move his left arm later that time,. Ultimately he did come back to have MRI which confirmed a stroke.  MRI demonstrated a large 3 cm right parietal and insular embolic infarct along with 3 small punctate infarcts in the right medial occipital lobes and left frontal vertex and left inferior cerebellum. CTA of the head/neck demonstrated a right MCA branch occlusion corresponding to the infarct.  Patient was evaluated by Neurology , medications adjusted.  Patient participated with physical therapy,  recommended CIR which was declined.  TOC was working on finding a placement.  Patient has decided to leave AGAINST MEDICAL ADVICE,  states he has emergency at home and he wants to leave.  Explained in detail about the risks associated.  Patient left AGAINST MEDICAL ADVICE.      Discharge Diagnoses:  Principal Problem:   Acute CVA (cerebrovascular accident) [facility]) Active Problems:   Homeless   Coronary artery disease   Cocaine abuse  (HCC)   Alcohol abuse   Essential hypertension   Hyperlipidemia   Malnutrition of moderate degree   History of CVA (cerebrovascular accident)   Hypertension, malignant   HFrEF (heart failure with reduced ejection fraction) [facility])    Discharge Instructions Patient left against medical Advice.   Allergies  Allergen Reactions   Shellfish Allergy Anaphylaxis   Peanut (Diagnostic) Itching    Consultations: Neurology   Procedures/Studies: MR BRAIN WO CONTRAST Result Date: 09/13/2023 CLINICAL DATA:  Stroke follow-up EXAM: MRI HEAD WITHOUT CONTRAST TECHNIQUE: Multiplanar, multiecho pulse sequences of the brain and surrounding structures were obtained without intravenous contrast. COMPARISON:  09/03/2023 FINDINGS: Brain: There is an acute/early subacute infarct within the posterior right MCA territory, slightly increased from the prior study. Old left frontoparietal infarct and old bilateral occipital infarcts. Petechial hemorrhage at the acute infarct site and at the old left frontoparietal infarct site. There is multifocal hyperintense T2-weighted signal within the white matter. Generalized volume loss. Mega cisterna magna. Vascular: Normal flow voids. Skull and upper cervical spine: Normal calvarium and skull base. Visualized upper cervical spine and soft tissues are normal. Sinuses/Orbits:No paranasal sinus fluid levels or advanced mucosal thickening. No mastoid or middle ear effusion. Normal orbits. IMPRESSION: 1. Acute/early subacute infarct within the posterior right MCA territory, slightly increased from the prior study. Petechial hemorrhage at the acute infarct site and at the old left frontoparietal infarct site. 2. Old left frontoparietal infarct and old bilateral occipital infarcts. Electronically Signed   By: Deatra Robinson M.D.   On: 09/13/2023 20:16   EEG adult Result Date: 09/13/2023 Charlsie Quest, MD     09/14/2023  9:10 AM  Patient Name: Craig Schmidt MRN: [id number]  Epilepsy Attending: Charlsie Quest Referring Physician/Provider: Elmer Picker, NP Date: 09/13/2023 Duration: 27.41 mins Patient history: 53yo M with seizure like activity. EEG to evaluate for seizure Level of alertness: Awake, asleep AEDs during EEG study: None Technical aspects: This EEG study was done with scalp electrodes positioned according to the 10-20 International system of electrode placement. Electrical activity was reviewed with band pass filter of 1-70Hz , sensitivity of 7 uV/mm, display speed of 52mm/sec with a 60Hz  notched filter applied as appropriate. EEG data were recorded continuously and digitally stored.  Video monitoring was available and reviewed as appropriate. Description: The posterior dominant rhythm consists of 9 Hz activity of moderate voltage (25-35 uV) seen predominantly in posterior head regions, symmetric and reactive to eye opening and eye closing. Sleep was characterized by vertex waves, sleep spindles (12 to 14 Hz), maximal frontocentral region.  Hyperventilation and photic stimulation were not performed.   EKG artifact was seen during the study IMPRESSION: This study is within normal limits. No seizures or epileptiform discharges were seen throughout the recording. A normal interictal EEG does not exclude the diagnosis of epilepsy. Priyanka Annabelle Harman   CT ANGIO HEAD NECK W WO CM Result Date: 09/04/2023 CLINICAL DATA:  Neuro deficit with stroke suspected EXAM: CT ANGIOGRAPHY HEAD AND NECK WITH AND WITHOUT CONTRAST TECHNIQUE: Multidetector CT imaging of the head and neck was performed using the standard protocol during bolus administration of intravenous contrast. Multiplanar CT image reconstructions and MIPs were obtained to evaluate the vascular anatomy. Carotid stenosis measurements (when applicable) are obtained utilizing NASCET criteria, using the distal internal carotid diameter as the denominator. RADIATION DOSE REDUCTION: This exam was performed according to the  departmental dose-optimization program which includes automated exposure control, adjustment of the mA and/or kV according to patient size and/or use of iterative reconstruction technique. CONTRAST:  75mL OMNIPAQUE IOHEXOL 350 MG/ML SOLN COMPARISON:  Brain MRI from yesterday.  09/08/2021 CTA FINDINGS: CT HEAD FINDINGS Brain: Mix of acute and chronic infarcts affecting the bilateral frontal and parietal cortex without evidence of progression since brain MRI yesterday. No hemorrhage, hydrocephalus, or shift. Vascular: See below Skull: Normal. Negative for fracture or focal lesion. Sinuses/Orbits: No acute finding. Review of the MIP images confirms the above findings CTA NECK FINDINGS Aortic arch: 3 vessel branching. Right carotid system: No evidence of dissection, stenosis (50% or greater), or occlusion. Left carotid system: No evidence of dissection, stenosis (50% or greater), or occlusion. Vertebral arteries: Codominant. No evidence of dissection, stenosis (50% or greater), or occlusion. Skeleton: Negative. Other neck: Negative. Upper chest: Hazy appearance of the bilateral lungs with small pleural effusion and septal thickening, CHF findings. Review of the MIP images confirms the above findings CTA HEAD FINDINGS Anterior circulation: Diffusely attenuated intracranial branches with peripheral luminal irregularity that is presumably atherosclerosis. There is a right M3 branch which is not seen to enhance proximally but does reconstitute, see markings on coronal reformats. This affected branch correlates with the acute right MCA distribution infarct. Comparatively less intense enhancement of left MCA branches which was also noted on prior and especially in a distribution related to the chronic frontal parietal infarcts. No emergent large vessel occlusion or focal and reversible proximal stenosis. Posterior circulation: Diffusely attenuated vertebral and basilar arteries without branch occlusion, beading, or aneurysm.  Venous sinuses: Unremarkable Anatomic variants: Unremarkable Review of the MIP images confirms the above findings IMPRESSION: 1. Peripheral right MCA branch occlusion correlating with the acute infarct. 2. Generalized  attenuated appearance of intracranial branches with peripheral narrowings, pattern similar to 2023 comparison. This may reflect atherosclerosis or other chronic vasculopathy. 3. No significant atherosclerosis or focal stenosis of major arteries in the neck. 4. CHF. Electronically Signed   By: Tiburcio Pea M.D.   On: 09/04/2023 06:41   MR BRAIN WO CONTRAST Result Date: 09/03/2023 CLINICAL DATA:  Neuro deficit, acute, stroke suspected. Weakness. Unable to walk. EXAM: MRI HEAD WITHOUT CONTRAST TECHNIQUE: Multiplanar, multiecho pulse sequences of the brain and surrounding structures were obtained without intravenous contrast. COMPARISON:  CT same day.  MRI 05/26/2023 FINDINGS: Brain: Diffusion imaging shows a probable punctate acute infarction within the inferior cerebellum on the left. There is a 3 cm region of acute cortical and subcortical infarction on the right affecting the deep insula and parietal lobe. No evidence of hemorrhagic transformation. There are 2 or 3 punctate acute infarctions in the right medial occipital cortex. There are a few clustered punctate acute infarctions in the left frontal vertex region. These findings suggest embolic disease from the heart or ascending aorta. Otherwise, there are few old small vessel cerebellar infarctions. Cerebral hemispheres otherwise show old bilateral frontal and parietal cortical and subcortical infarctions. No evidence of intra-axial mass lesion. 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 Extra canalicular component measures 11 mm, without apparent mass-effect upon the brain. No hydrocephalus. No subdural collection. Vascular: Major vessels at the base of the brain show flow. Skull  and upper cervical spine: Negative Sinuses/Orbits: Clear/normal Other: None IMPRESSION: 1. 3 cm region of acute cortical and subcortical infarction on the right affecting the deep insula and parietal lobe consistent with embolic infarction in the right MCA territory. 2 or 3 punctate acute infarctions in the right medial occipital cortex. Few clustered punctate acute infarctions in the left frontal vertex region. Probable punctate acute infarction in the inferior left cerebellum. These findings suggest embolic disease from the heart or ascending aorta. 2. Old bilateral frontal and parietal cortical and subcortical infarctions. 3. 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 Extra canalicular component measures 11 mm, without apparent mass-effect upon the brain. Electronically Signed   By: Paulina Fusi M.D.   On: 09/03/2023 22:56   CT Head Wo Contrast Result Date: 09/03/2023 CLINICAL DATA:  Syncope/presyncope. Cerebrovascular cause suspected. EXAM: CT HEAD WITHOUT CONTRAST TECHNIQUE: Contiguous axial images were obtained from the base of the skull through the vertex without intravenous contrast. RADIATION DOSE REDUCTION: This exam was performed according to the departmental dose-optimization program which includes automated exposure control, adjustment of the mA and/or kV according to patient size and/or use of iterative reconstruction technique. COMPARISON:  MRI 05/26/2023 FINDINGS: Brain: Generalized atrophy. Numerous old cortical infarctions including in both parietal lobes and in the left frontal lobe. No evidence of acute infarction, mass lesion, hemorrhage, hydrocephalus or extra-axial collection. Incidental mega cisterna magna. Known left vestibular schwannoma. Vascular: No abnormal vascular finding. Skull: Negative Sinuses/Orbits: Clear/normal Other: None IMPRESSION: No acute CT finding. Generalized atrophy. Numerous old cortical infarctions  including in both parietal lobes and in the left frontal lobe. Known left vestibular schwannoma without apparent change. Electronically Signed   By: Paulina Fusi M.D.   On: 09/03/2023 17:33   DG Chest Portable 1 View Result Date: 09/03/2023 CLINICAL DATA:  Concern for pneumonia. EXAM: PORTABLE CHEST 1 VIEW COMPARISON:  07/11/2023. FINDINGS: There is left retrocardiac airspace opacity obscuring the left hemidiaphragm, descending thoracic aorta and  blunting the left lateral costophrenic angle, suggesting combination of left lung atelectasis and/or consolidation with pleural effusion. Bilateral lung fields are otherwise clear. Right lateral costophrenic angle is clear. No pneumothorax on either side. Stable mildly enlarged cardio-mediastinal silhouette. No acute osseous abnormalities. The soft tissues are within normal limits. IMPRESSION: *Left retrocardiac opacity, as described above. Electronically Signed   By: Jules Schick M.D.   On: 09/03/2023 16:09     Subjective: Patient was seen and examined at bedside.  Overnight events noted.   Patient wants to  left AGAINST MEDICAL ADVICE.  Patient was explained in detail about the risks associated with LAMA.  He understood and signed to leave AMA.  Discharge Exam: Vitals:   09/20/23 0001 09/20/23 0343  BP: (!) 128/91 120/89  Pulse: 93 95  Resp: 18 16  Temp: 98.1 F (36.7 C) 98 F (36.7 C)  SpO2: 100% 100%   Vitals:   09/19/23 1757 09/19/23 1939 09/20/23 0001 09/20/23 0343  BP: 124/88 (!) 129/101 (!) 128/91 120/89  Pulse: 98 (!) 105 93 95  Resp: 18 18 18 16   Temp: 97.7 F (36.5 C) 98.7 F (37.1 C) 98.1 F (36.7 C) 98 F (36.7 C)  TempSrc: Oral Oral Oral   SpO2: 100% 100% 100% 100%  Weight:      Height:        General: Pt is alert, awake, not in acute distress Cardiovascular: RRR, S1/S2 +, no rubs, no gallops Respiratory: CTA bilaterally, no wheezing, no rhonchi Abdominal: Soft, NT, ND, bowel sounds + Extremities: Right sided  weakness, no edema, no cyanosis    The results of significant diagnostics from this hospitalization (including imaging, microbiology, ancillary and laboratory) are listed below for reference.     Microbiology: No results found for this or any previous visit (from the past 240 hours).   Labs: BNP (last 3 results) Recent Labs    07/10/23 0617 07/10/23 2031 09/03/23 1634  BNP 2,198.2* 2,597.3* 2,078.8*   Basic Metabolic Panel: Recent Labs  Lab 09/14/23 8295 09/15/23 0631 09/16/23 0655 09/19/23 0627  NA 138 136 135 139  K 4.4 4.7 4.9 4.8  CL 103 105 105 109  CO2 24 24 22  21*  GLUCOSE 112* 103* 104* 104*  BUN 27* 34* 31* 29*  CREATININE 1.60* 1.44* 1.36* 1.26*  CALCIUM 9.0 9.1 9.3 9.4  MG 2.3  --   --  2.0  PHOS 4.7*  --   --  5.1*   Liver Function Tests: Recent Labs  Lab 09/15/23 0631  AST 42*  ALT 17  ALKPHOS 96  BILITOT 0.5  PROT 6.8  ALBUMIN 2.9*   No results for input(s): "LIPASE", "AMYLASE" in the last 168 hours. No results for input(s): "AMMONIA" in the last 168 hours. CBC: Recent Labs  Lab 09/14/23 0635 09/19/23 0627  WBC 10.2 7.4  HGB 12.8* 11.4*  HCT 41.6 36.4*  MCV 89.5 89.0  PLT 421* 364   Cardiac Enzymes: No results for input(s): "CKTOTAL", "CKMB", "CKMBINDEX", "TROPONINI" in the last 168 hours. BNP: Invalid input(s): "POCBNP" CBG: Recent Labs  Lab 09/13/23 1239  GLUCAP 107*   D-Dimer No results for input(s): "DDIMER" in the last 72 hours. Hgb A1c No results for input(s): "HGBA1C" in the last 72 hours. Lipid Profile No results for input(s): "CHOL", "HDL", "LDLCALC", "TRIG", "CHOLHDL", "LDLDIRECT" in the last 72 hours. Thyroid function studies No results for input(s): "TSH", "T4TOTAL", "T3FREE", "THYROIDAB" in the last 72 hours.  Invalid input(s): "FREET3" Anemia work up No  results for input(s): "VITAMINB12", "FOLATE", "FERRITIN", "TIBC", "IRON", "RETICCTPCT" in the last 72 hours. Urinalysis    Component Value Date/Time    COLORURINE YELLOW 09/03/2023 2000   APPEARANCEUR CLEAR 09/03/2023 2000   LABSPEC 1.018 09/03/2023 2000   PHURINE 5.0 09/03/2023 2000   GLUCOSEU NEGATIVE 09/03/2023 2000   HGBUR NEGATIVE 09/03/2023 2000   BILIRUBINUR NEGATIVE 09/03/2023 2000   KETONESUR NEGATIVE 09/03/2023 2000   PROTEINUR 100 (A) 09/03/2023 2000   UROBILINOGEN 0.2 05/14/2015 0845   NITRITE NEGATIVE 09/03/2023 2000   LEUKOCYTESUR NEGATIVE 09/03/2023 2000   Sepsis Labs Recent Labs  Lab 09/14/23 0635 09/19/23 0627  WBC 10.2 7.4   Microbiology No results found for this or any previous visit (from the past 240 hours).   Time coordinating discharge: Over 30 minutes  SIGNED:   Willeen Niece, MD  [facility] 09/20/2023, 11:24 AM Pager

## 2023-09-20 NOTE — Plan of Care (Signed)
 Plan of care reviewed with patient and significant other. Orders and medications reviewed and education provided on purpose of medications. Questions answered

## 2023-09-20 NOTE — Progress Notes (Signed)
 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  Patient presented to [facility] ED on 09/03/2023 with complaints of loss of vision, weakness, and inability to stand.  CT head was negative, Patient has  left AMA. He was unable to move his left arm later that time,. Ultimately he did come back to have MRI which confirmed a stroke.  MRI demonstrated a large 3 cm right parietal and insular embolic infarct along with 3 small punctate infarcts in the right medial occipital lobes and left frontal vertex and left inferior cerebellum. CTA of the head/neck demonstrated a right MCA branch occlusion corresponding to the infarct.     He was admitted to [facility] on 07/10/2023 for acute systolic heart failure, and 07/16/2023 for multifocal pneumonia with necrotizing component s/p BAL with negative bacterial culture and AFB/fungal cultures.     Patient is now medically clear, awaiting disposition.  CIR declined, TOC working on finding placement.   Assessment & Plan:   Principal Problem:   Acute CVA (cerebrovascular accident) [facility]) Active Problems:   Homeless   Coronary artery disease   Cocaine abuse (HCC)   Alcohol abuse   Essential hypertension   Hyperlipidemia   Malnutrition of moderate degree   History of CVA (cerebrovascular accident)   Hypertension, malignant   HFrEF (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)  Acute CVA, concern for embolic etiology: Patient has suffered right parietal infact with occlusion of the right MCA branch.  He was evaluated by neurology. He was counseled regarding his polysubstance abuse. Continue aspirin and Eliquis.  Noted to be  on statin. LDL noted to be 77.  He underwent recent echocardiogram in December which showed LVEF of less than 20%.  HbA1c was 5.6 in November. Seen by PT and OT.  Inpatient rehab insurance authorization is currently in progress Started on gabapentin for neuropathic pain in his feet. 09/17/2023: Awaiting disposition.  Neurology signed off. 09/20/23: Patient left AGAINST MEDICAL ADVICE , states he has an emergency he has to leave.   Dysphagia: Seen by speech therapy.  Remains on heart healthy diet.     CKD stage IIIa: Baseline Creatinine 1.44 Avoid nephrotoxic medications,  monitor serum creatinine.   Cocaine abuse [facility]): He has been advised that he must stop cocaine use to avoid catastrophic CVA in the future.   Essential hypertension: Hold lasix, entresto, aldactone as of 09/13/2023.   HFrEF (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) LVEF less than 20%.  Patient continues to do illicit drugs especially cocaine.   Home medication list reviewed.  He is supposed to be on carvedilol, Jardiance, furosemide and Entresto along with spironolactone (held due to hypotension on 09/13/2023) Consider restarting these medications as blood pressure improves.   Hyperlipidemia: Continue lipitor 40 mg daily.   Alcohol abuse: Continue CIWA protocol.  No evidence for withdrawal currently.   Continue thiamine multivitamins. Awaiting bed at skilled nursing facility. Patient left AGAINST MEDICAL ADVICE.   DVT prophylaxis: Eliquis Code Status: Full code Family Communication: Girl Friend at bedside Disposition Plan:    Status is: Inpatient Remains inpatient appropriate because: Severity of illness.    Consultants:  Neurology  Procedures:  Antimicrobials:  Anti-infectives (From  admission, onward)    None       Subjective: Patient was seen and examined at bedside.  Overnight events noted.   Patient reports feeling better , states he has an emergency at home and he wants to leave  AGAINST MEDICAL ADVICE.   Explained in detail the risk associated with LAMA.  Objective: Vitals:   09/19/23 1757 09/19/23 1939 09/20/23 0001 09/20/23 0343  BP: 124/88 (!) 129/101 (!) 128/91 120/89  Pulse: 98 (!) 105 93 95  Resp: 18 18 18 16   Temp: 97.7 F (36.5 C) 98.7 F (37.1 C) 98.1 F (36.7 C) 98 F (36.7 C)  TempSrc: Oral Oral Oral   SpO2: 100% 100% 100% 100%  Weight:      Height:       No intake or output data in the 24 hours ending 09/20/23 1119  Filed Weights   09/04/23 1017  Weight: 68 kg    Examination:  General exam: Appears comfortable, deconditioned, not in any acute distress. Respiratory system: Clear to auscultation. Respiratory effort normal.  RR 15 Cardiovascular system: S1 & S2 heard, RRR. No JVD, murmurs, rubs, gallops or clicks.  Gastrointestinal system: Abdomen is non distended, soft and non tender.  Normal bowel sounds heard. Central nervous system: Alert and oriented x 3.  Right-sided weakness. Extremities: No edema, no cyanosis, no clubbing Skin: No rashes, lesions or ulcers Psychiatry: Judgement and insight appear normal. Mood & affect appropriate.     Data Reviewed: I have personally reviewed following labs and imaging studies  CBC: Recent Labs  Lab 09/14/23 0635 09/19/23 0627  WBC 10.2 7.4  HGB 12.8* 11.4*  HCT 41.6 36.4*  MCV 89.5 89.0  PLT 421* 364   Basic Metabolic Panel: Recent Labs  Lab 09/14/23 0635 09/15/23 0631 09/16/23 0655 09/19/23 0627  NA 138 136 135 139  K 4.4 4.7 4.9 4.8  CL 103 105 105 109  CO2 24 24 22  21*  GLUCOSE 112* 103* 104* 104*  BUN 27* 34* 31* 29*  CREATININE 1.60* 1.44* 1.36* 1.26*  CALCIUM 9.0 9.1 9.3 9.4  MG 2.3  --   --  2.0  PHOS 4.7*  --   --  5.1*   GFR: Estimated Creatinine Clearance: 64.8 mL/min (A) (by C-G formula based on SCr of 1.26 mg/dL (H)). Liver Function Tests: Recent Labs  Lab 09/15/23 0631  AST 42*  ALT 17  ALKPHOS 96  BILITOT 0.5  PROT 6.8  ALBUMIN 2.9*   No  results for input(s): "LIPASE", "AMYLASE" in the last 168 hours. No results for input(s): "AMMONIA" in the last 168 hours. Coagulation Profile: No results for input(s): "INR", "PROTIME" in the last 168 hours. Cardiac Enzymes: No results for input(s): "CKTOTAL", "CKMB", "CKMBINDEX", "TROPONINI" in the last 168 hours. BNP (last 3 results) No results for input(s): "PROBNP" in the last 8760 hours. HbA1C: No results for input(s): "HGBA1C" in the last 72 hours. CBG: Recent Labs  Lab 09/13/23 1239  GLUCAP 107*   Lipid Profile: No results for input(s): "CHOL", "HDL", "LDLCALC", "TRIG", "CHOLHDL", "LDLDIRECT" in the last 72 hours. Thyroid Function Tests: No results for input(s): "TSH", "T4TOTAL", "FREET4", "T3FREE", "THYROIDAB" in the last 72 hours. Anemia Panel: No results for input(s): "VITAMINB12", "FOLATE", "FERRITIN", "TIBC", "IRON", "RETICCTPCT" in the last 72 hours. Sepsis Labs: No results for input(s): "PROCALCITON", "LATICACIDVEN" in the last 168 hours.  No results found for this or any previous visit (from the past 240 hours).   Radiology Studies: No results found.  Scheduled Meds:  apixaban  5 mg Oral BID   aspirin EC  81 mg Oral Daily   atorvastatin  40 mg Oral Daily   folic acid  1 mg Oral Daily   gabapentin  200 mg Oral BID   mirtazapine  15 mg Oral QHS   multivitamin with minerals  1 tablet Oral Daily   nicotine  14 mg Transdermal Daily   thiamine  100 mg Oral Daily   Or   thiamine  100 mg Intravenous Daily   Continuous Infusions:   LOS: 16 days    Time spent: 35 mins    Willeen Niece, MD [facility]   If 7PM-7AM, please contact night-coverage

## 2023-09-23 ENCOUNTER — Telehealth: Payer: Self-pay

## 2023-09-23 NOTE — Transitions of Care (Post Inpatient/ED Visit) (Signed)
   09/23/2023  Name: Craig Schmidt MRN: [id number] DOB: 04-Dec-1970  Today's TOC FU Call Status: Today's TOC FU Call Status:: Successful TOC FU Call Completed TOC FU Call Complete Date: 09/23/23 Patient's Name and Date of Birth confirmed.  Transition Care Management Follow-up Telephone Call Date of Discharge: 09/20/23 Discharge Facility: [facility]) Type of Discharge: Inpatient Admission Primary Inpatient Discharge Diagnosis:: CVA- left AMA.  He stated he left due to a family emergency How have you been since you were released from the hospital?: Better Any questions or concerns?: No  Items Reviewed: Did you receive and understand the discharge instructions provided?: No (He left AMA, no AVS provided) Medications obtained,verified, and reconciled?: No Medications Not Reviewed Reasons:: Other: (He left AMA, no new medications ordered.  He said he has all of the medications he was taking prior to his hospitalization) Any new allergies since your discharge?: No Dietary orders reviewed?: No Do you have support at home?: Yes People in Home: significant other Name of Support/Comfort Primary Source: his girlfriend.  Medications Reviewed Today: Medications Reviewed Today   Medications were not reviewed in this encounter     Home Care and Equipment/Supplies: Were Home Health Services Ordered?: No Any new equipment or medical supplies ordered?: No  Functional Questionnaire: Do you need assistance with bathing/showering or dressing?: Yes (His girlfriend assists) Do you need assistance with meal preparation?: Yes (His girlfriend assists) Do you need assistance with eating?: No Do you have difficulty maintaining continence: No Do you need assistance with getting out of bed/getting out of a chair/moving?: Yes (His girlfriend assists, and he has a cane to use as needed) Do you have difficulty managing or taking your medications?: Yes (His girlfriend assists)  Follow up appointments  reviewed: PCP Follow-up appointment confirmed?: Yes Date of PCP follow-up appointment?: 10/02/23 Follow-up Provider: Ricky Stabs, NP Specialist Hospital Follow-up appointment confirmed?: Yes Date of Specialist follow-up appointment?: 10/21/23 Follow-Up Specialty Provider:: Neurology Do you need transportation to your follow-up appointment?: Yes Transportation Need Intervention Addressed By:: Other: (I explained to him that he can contact his insurance company for rides to medical appointments, However, if there is no other option, he can call [facility] and request a ride and a cab will be arranged.) Do you understand care options if your condition(s) worsen?: Yes-patient verbalized understanding    SIGNATURE Robyne Peers, RN

## 2023-09-27 ENCOUNTER — Other Ambulatory Visit (HOSPITAL_COMMUNITY): Payer: Self-pay

## 2023-09-27 ENCOUNTER — Other Ambulatory Visit: Payer: Self-pay | Admitting: Family

## 2023-09-27 ENCOUNTER — Ambulatory Visit: Payer: Self-pay | Admitting: Family

## 2023-09-27 NOTE — Telephone Encounter (Signed)
  Chief Complaint: frequent urination, thirsty Symptoms: frequent urination, thirsty. Reports urinating 6-7 times in an hour.  Urine color clear. Taking lasix 40 mg daily . Unknown blood sugar.  Frequency: 2-3 days ago  Pertinent Negatives: Patient denies chest pain no difficulty breathing no fever, no headache no blurred vision no dizziness Disposition: [] ED /[] Urgent Care (no appt availability in office) / [] Appointment(In office/virtual)/ []  [facility]/ [] Home Care/ [] Refused Recommended Disposition /[]  Mobile Bus/ [x]  Follow-up with PCP Additional Notes:   No available appt today . Hospital F/U appt already scheduled for 10/02/23. Recommended patient get blood sugar checked and pt does not have monitor . Recommended sx continue go to UC or ED get Blood sugar checked. Please advise if patient should continue taking lasix and if patient can come to office to check BS. Please advise .      Copied from CRM [id number]. Topic: Clinical - Red Word Triage >> Sep 27, 2023  2:31 PM Clayton Bibles wrote: Red Word that prompted transfer to Nurse Triage: Monterio's spouse is calling. She said that he is urinating A LOT. Yesterday he urinated 6-7 times in an hour and half. He is on Lasix and she in concerned Reason for Disposition  Urinating more frequently than usual (i.e., frequency)  Answer Assessment - Initial Assessment Questions 1. SYMPTOM: "What's the main symptom you're concerned about?" (e.g., frequency, incontinence)     Frequency 6-7 times in an hour and is taking lasix 40 mg daily  2. ONSET: "When did the  sx   start?"     2-3 days ago 3. PAIN: "Is there any pain?" If Yes, ask: "How bad is it?" (Scale: 1-10; mild, moderate, severe)     na 4. CAUSE: "What do you think is causing the symptoms?"     Not sure  5. OTHER SYMPTOMS: "Do you have any other symptoms?" (e.g., blood in urine, fever, flank pain, pain with urination)     Frequency urination thirsty  6. PREGNANCY: "Is  there any chance you are pregnant?" "When was your last menstrual period?"     na  Protocols used: Urinary Symptoms-A-AH

## 2023-10-02 ENCOUNTER — Encounter: Payer: Medicaid Other | Admitting: Family

## 2023-10-02 ENCOUNTER — Telehealth: Payer: Self-pay | Admitting: Family

## 2023-10-02 ENCOUNTER — Other Ambulatory Visit (HOSPITAL_COMMUNITY): Payer: Self-pay

## 2023-10-02 NOTE — Progress Notes (Signed)
 Erroneous encounter-disregard

## 2023-10-02 NOTE — Telephone Encounter (Signed)
 Patient is considered a no show sine he called -24

## 2023-10-02 NOTE — Telephone Encounter (Signed)
 Copied from CRM [id number]. Topic: Appointments - Scheduling Inquiry for Clinic >> Oct 02, 2023 11:56 AM Marland Kitchen D wrote: Patient wants to cancel his appointment today it conflicts with another appointment he has.

## 2023-10-14 ENCOUNTER — Inpatient Hospital Stay

## 2023-10-14 ENCOUNTER — Telehealth: Payer: Self-pay

## 2023-10-14 ENCOUNTER — Inpatient Hospital Stay: Attending: Hematology and Oncology | Admitting: Hematology and Oncology

## 2023-10-14 ENCOUNTER — Telehealth: Payer: Self-pay | Admitting: Family

## 2023-10-14 NOTE — Telephone Encounter (Signed)
 Patient established with [facility] with recommendation for the same to advise.

## 2023-10-14 NOTE — Telephone Encounter (Signed)
 Copied from CRM [id number]. Topic: Clinical - Medication Question >> Oct 11, 2023  4:14 PM Turkey B wrote: Reason for CRM: pt called in asking should he still be taking , the med, sacubitril-valsartan (ENTRESTO) 24-26 MG

## 2023-10-15 ENCOUNTER — Telehealth: Payer: Self-pay

## 2023-10-15 NOTE — Telephone Encounter (Signed)
 Noted thank you

## 2023-10-15 NOTE — Telephone Encounter (Signed)
 Reason for CRM: Everardo Beals called to follow up on the call she had with Erskine Squibb on 3/3 about getting patient PT and a wheelchair. Please f/u with patient to let him know where things are with his request

## 2023-10-21 ENCOUNTER — Ambulatory Visit: Payer: Medicaid Other | Admitting: Neurology

## 2023-10-23 ENCOUNTER — Encounter: Admitting: Family

## 2023-10-23 NOTE — Progress Notes (Signed)
 Erroneous encounter-disregard

## 2023-11-07 ENCOUNTER — Inpatient Hospital Stay

## 2023-11-07 ENCOUNTER — Inpatient Hospital Stay: Admitting: Oncology

## 2023-11-15 ENCOUNTER — Ambulatory Visit: Payer: Self-pay

## 2023-11-15 ENCOUNTER — Other Ambulatory Visit (HOSPITAL_COMMUNITY): Payer: Self-pay

## 2023-11-15 ENCOUNTER — Other Ambulatory Visit: Payer: Self-pay | Admitting: Family

## 2023-11-15 NOTE — Telephone Encounter (Signed)
 Copied from CRM [id number]. Topic: Appointments - Appointment Scheduling >> Nov 15, 2023  2:24 PM Lorenz Romano B wrote: Patient/patient representative is calling to schedule an appointment. Refer to attachments for appointment information.  Patient has swelling in feet called in to get rx refilled do not have any more refills and says that's why his feet are starting to swell   Chief Complaint: Swelling in feet, Out of Lasix  medication Symptoms: swelling in both feet, pain in feet and toes, feeling tired Frequency: 2-3 days Pertinent Negatives: Patient denies chest pain, fever, difficulty breathing Disposition: [x] ED /[] Urgent Care (no appt availability in office) / [] Appointment(In office/virtual)/ []  [facility]/ [] Home Care/ [] Refused Recommended Disposition /[] [facility]/ []  Follow-up with PCP Additional Notes: Carren Civatte is present with the patient and she had called and advised that he is out of his Lasix . Patient is currently present in the room and answering questions over the phone with this RN. Patient has been without this medication for about 3 or so days. Patient states that his lower legs, ankles, and feet are swollen and he said his feet and toes are very painful---8 out of 10 on a pain scale.  He also states that it is very painful when trying to walk. Patient denies any chest pain, fever, or difficulty breathing. Patient states that he is on blood thinners and he states that the last time he had swelling like this, he ended up being admitted to the hospital for a stroke. Patient denies any other symptoms besides lower leg swelling, pain in his feet/toes, and feeling tired. Patient is advised that with his medical history and lower leg swelling without anymore medication for fluid retention---it is advised that he goes to the Emergency Room at this time. Patient states that he will go to the Emergency Room for further evaluation at this time.    Reason for  Disposition  [1] Can't walk or can barely walk AND [2] new-onset  Answer Assessment - Initial Assessment Questions 1. ONSET: "When did the swelling start?" (e.g., minutes, hours, days)     2-3 days 2. LOCATION: "What part of the leg is swollen?"  "Are both legs swollen or just one leg?"     Both lower legs--calf muscles, ankles, feet 3. SEVERITY: "How bad is the swelling?" (e.g., localized; mild, moderate, severe)   - Localized: Small area of swelling localized to one leg.   - MILD pedal edema: Swelling limited to foot and ankle, pitting edema < 1/4 inch (6 mm) deep, rest and elevation eliminate most or all swelling.   - MODERATE edema: Swelling of lower leg to knee, pitting edema > 1/4 inch (6 mm) deep, rest and elevation only partially reduce swelling.   - SEVERE edema: Swelling extends above knee, facial or hand swelling present.      Slight swelling but not severe per patient 4. REDNESS: "Does the swelling look red or infected?"     No 5. PAIN: "Is the swelling painful to touch?" If Yes, ask: "How painful is it?"   (Scale 1-10; mild, moderate or severe)     8 6. FEVER: "Do you have a fever?" If Yes, ask: "What is it, how was it measured, and when did it start?"      No 7. CAUSE: "What do you think is causing the leg swelling?"     Patient out of Lasix  medication for fluid 8. MEDICAL HISTORY: "Do you have a history of blood clots (e.g., DVT), cancer, heart  failure, kidney disease, or liver failure?"     Patient on medication for blood clots 9. RECURRENT SYMPTOM: "Have you had leg swelling before?" If Yes, ask: "When was the last time?" "What happened that time?"     Happened before and he was hospitalized with his second stroke 10. OTHER SYMPTOMS: "Do you have any other symptoms?" (e.g., chest pain, difficulty breathing)       Tired  Protocols used: Leg Swelling and Edema-A-AH

## 2023-11-15 NOTE — Telephone Encounter (Signed)
 Requested medications are due for refill today.  unsure  Requested medications are on the active medications list.  yes  Last refill. 07/14/2023 #30 0 rf  Future visit scheduled.   no  Notes to clinic.  Rx signed by Daivd Dub. Rx written to expire 08/14/2023.    Requested Prescriptions  Pending Prescriptions Disp Refills   furosemide  (LASIX ) 40 MG tablet 30 tablet 0    Sig: Take 1 tablet (40 mg total) by mouth daily.     Cardiovascular:  Diuretics - Loop Failed - 11/15/2023  5:43 PM      Failed - Cr in normal range and within 180 days    Creatinine, Ser  Date Value Ref Range Status  09/19/2023 1.26 (H) 0.61 - 1.24 mg/dL Final         Passed - K in normal range and within 180 days    Potassium  Date Value Ref Range Status  09/19/2023 4.8 3.5 - 5.1 mmol/L Final         Passed - Ca in normal range and within 180 days    Calcium   Date Value Ref Range Status  09/19/2023 9.4 8.9 - 10.3 mg/dL Final   Calcium , Ion  Date Value Ref Range Status  04/13/2022 1.21 1.15 - 1.40 mmol/L Final  04/13/2022 1.12 (L) 1.15 - 1.40 mmol/L Final         Passed - Na in normal range and within 180 days    Sodium  Date Value Ref Range Status  09/19/2023 139 135 - 145 mmol/L Final  07/29/2023 140 134 - 144 mmol/L Final         Passed - Cl in normal range and within 180 days    Chloride  Date Value Ref Range Status  09/19/2023 109 98 - 111 mmol/L Final         Passed - Mg Level in normal range and within 180 days    Magnesium   Date Value Ref Range Status  09/19/2023 2.0 1.7 - 2.4 mg/dL Final    Comment:    Performed at [facility], [address] [place], [place] [address]         Passed - Last BP in normal range    BP Readings from Last 1 Encounters:  09/20/23 120/89         Passed - Valid encounter within last 6 months    Recent Outpatient Visits           3 months ago Hospital discharge follow-up   [facility] at [facility], [place], [place]    9 months ago Runny nose   [place] Primary Care at [facility], [place], [place]   1 year ago Chronic bilateral low back pain, unspecified whether sciatica present   [facility] at [facility], [place], [place]   1 year ago Annual physical exam   [place] Primary Care at [facility], [place], [place]   1 year ago Encounter to establish care   [facility] Primary Care at [facility], [place]e Barren, NP       Future Appointments             In 1 month Katheryne Pane, Frederico Jan, MD [facility] HeartCare at [facility] Dept. of [place]. [facility], [facility]

## 2023-11-15 NOTE — Telephone Encounter (Signed)
 Copied from CRM [id number]. Topic: Clinical - Medication Refill >> Nov 15, 2023  4:29 PM Star East wrote: Most Recent Primary Care Visit:  Provider: Senaida Dama  Department: [facility]  Visit Type: OFFICE VISIT  Date: 07/29/2023  Medication: furosemide  (LASIX ) 40 MG tablet  Has the patient contacted their pharmacy? Yes (Agent: If no, request that the patient contact the pharmacy for the refill. If patient does not wish to contact the pharmacy document the reason why and proceed with request.) (Agent: If yes, when and what did the pharmacy advise?)  Is this the correct pharmacy for this prescription? Yes If no, delete pharmacy and type the correct one.  This is the patient's preferred pharmacy:    [facility] [address], [place] - [address]  [place] [address] Phone: [phone] Fax: [phone]     Has the prescription been filled recently? Yes  Is the patient out of the medication? Yes  Has the patient been seen for an appointment in the last year OR does the patient have an upcoming appointment? Yes  Can we respond through MyChart? No  Agent: Please be advised that Rx refills may take up to 3 business days. We ask that you follow-up with your pharmacy.

## 2023-11-18 NOTE — Telephone Encounter (Signed)
 Report to the Emergency Department/Urgent Care/call 911 for immediate medical evaluation. Follow-up with Primary Care.

## 2023-11-19 ENCOUNTER — Other Ambulatory Visit: Payer: Self-pay | Admitting: Oncology

## 2023-11-19 DIAGNOSIS — D649 Anemia, unspecified: Secondary | ICD-10-CM

## 2023-11-19 NOTE — Telephone Encounter (Signed)
 I spoke with patient and made them aware of  recommendations per PCP.  Patient verbalized understanding.  Patient stated "he scheduled an appointment

## 2023-11-20 ENCOUNTER — Inpatient Hospital Stay: Payer: Self-pay | Attending: Oncology | Admitting: Oncology

## 2023-11-20 ENCOUNTER — Inpatient Hospital Stay: Payer: MEDICAID

## 2023-11-20 ENCOUNTER — Inpatient Hospital Stay: Payer: Self-pay

## 2023-11-21 ENCOUNTER — Other Ambulatory Visit: Payer: Self-pay | Admitting: Family

## 2023-11-21 ENCOUNTER — Encounter: Payer: MEDICAID | Admitting: Family

## 2023-11-21 DIAGNOSIS — L309 Dermatitis, unspecified: Secondary | ICD-10-CM

## 2023-11-21 NOTE — Telephone Encounter (Signed)
 Copied from CRM [id number]. Topic: Clinical - Medication Refill >> Nov 21, 2023  9:06 AM Craig Schmidt wrote: Most Recent Primary Care Visit:  Provider: Lavona Pounds J  Department: [facility]  Visit Type: OFFICE VISIT  Date: 07/29/2023  Medication: furosemide  (LASIX ) 40 MG tablet and triamcinolone  (KENALOG ) 0.025 % cream  Has the patient contacted their pharmacy? No (Agent: If no, request that the patient contact the pharmacy for the refill. If patient does not wish to contact the pharmacy document the reason why and proceed with request.) (Agent: If yes, when and what did the pharmacy advise?)  Is this the correct pharmacy for this prescription? Yes If no, delete pharmacy and type the correct one.  This is the patient's preferred pharmacy:   [facility] [address], [place] - [address] [place] [address] Phone: [phone] Fax: [phone]  Has the prescription been filled recently? No  Is the patient out of the medication? Yes  Has the patient been seen for an appointment in the last year OR does the patient have an upcoming appointment? Yes  Can we respond through MyChart? No  Agent: Please be advised that Rx refills may take up to 3 business days. We ask that you follow-up with your pharmacy.

## 2023-11-21 NOTE — Progress Notes (Signed)
 Erroneous encounter-disregard

## 2023-11-24 NOTE — Telephone Encounter (Signed)
 Requested medication (s) are due for refill today - provider review  Requested medication (s) are on the active medication list -yes  Future visit scheduled -no  Last refill: furosemide - 07/14/23 #30- outside provider (hospital)                 Triamcinolone - 07/29/23 60g 1RF- non delegated Rx  Notes to clinic: see above  Requested Prescriptions  Pending Prescriptions Disp Refills   furosemide  (LASIX ) 40 MG tablet 30 tablet 0    Sig: Take 1 tablet (40 mg total) by mouth daily.     Cardiovascular:  Diuretics - Loop Failed - 11/24/2023 12:20 PM      Failed - Cr in normal range and within 180 days    Creatinine, Ser  Date Value Ref Range Status  09/19/2023 1.26 (H) 0.61 - 1.24 mg/dL Final         Passed - K in normal range and within 180 days    Potassium  Date Value Ref Range Status  09/19/2023 4.8 3.5 - 5.1 mmol/L Final         Passed - Ca in normal range and within 180 days    Calcium   Date Value Ref Range Status  09/19/2023 9.4 8.9 - 10.3 mg/dL Final   Calcium , Ion  Date Value Ref Range Status  04/13/2022 1.21 1.15 - 1.40 mmol/L Final  04/13/2022 1.12 (L) 1.15 - 1.40 mmol/L Final         Passed - Na in normal range and within 180 days    Sodium  Date Value Ref Range Status  09/19/2023 139 135 - 145 mmol/L Final  07/29/2023 140 134 - 144 mmol/L Final         Passed - Cl in normal range and within 180 days    Chloride  Date Value Ref Range Status  09/19/2023 109 98 - 111 mmol/L Final         Passed - Mg Level in normal range and within 180 days    Magnesium   Date Value Ref Range Status  09/19/2023 2.0 1.7 - 2.4 mg/dL Final    Comment:    Performed at [facility], [address], [place], [place] [address]         Passed - Last BP in normal range    BP Readings from Last 1 Encounters:  09/20/23 120/89         Passed - Valid encounter within last 6 months    Recent Outpatient Visits           3 months ago Hospital discharge follow-up   [facility]  Health Primary [facility] at [facility], [place], [place]   9 months ago Runny nose   [place] Primary Care at [facility], [place], [place]   1 year ago Chronic bilateral low back pain, unspecified whether sciatica present   [facility] at [facility], [place], [place]   1 year ago Annual physical exam   [facility] at [facility], [place], [place]   1 year ago Encounter to establish care   [facility] Primary Care at [facility], [place] Barren, NP       Future Appointments             In 1 month Katheryne Pane, Frederico Jan, MD [facility] HeartCare at [facility] Dept of [facility] [place] [facility], [facility]  triamcinolone  (KENALOG ) 0.025 % cream 60 g 1    Sig: Apply 1 Application topically 2 (two) times daily.     Not Delegated - Dermatology:  Corticosteroids Failed - 11/24/2023 12:20 PM      Failed - This refill cannot be delegated      Passed - Valid encounter within last 12 months    Recent Outpatient Visits           3 months ago Hospital discharge follow-up   [facility] at [facility], [place], [place]   9 months ago Runny nose   [place] [facility] Care at [facility], [place], [place]   1 year ago Chronic bilateral low back pain, unspecified whether sciatica present   [facility] Health Primary Care at [facility], [place], [place]   1 year ago Annual physical exam   [place] Primary Care at [facility], [place], [place]   1 year ago Encounter to establish care   [facility] Primary Care at [facility], [place]e Barren, NP       Future Appointments             In 1 month Katheryne Pane, Frederico Jan, MD [facility] HeartCare at [facility] Dept of [facility] [place] [facility], [facility]               Requested Prescriptions  Pending Prescriptions Disp Refills   furosemide  (LASIX ) 40 MG tablet 30 tablet 0    Sig: Take 1 tablet (40 mg total) by mouth daily.     Cardiovascular:  Diuretics - Loop Failed - 11/24/2023  12:20 PM      Failed - Cr in normal range and within 180 days    Creatinine, Ser  Date Value Ref Range Status  09/19/2023 1.26 (H) 0.61 - 1.24 mg/dL Final         Passed - K in normal range and within 180 days    Potassium  Date Value Ref Range Status  09/19/2023 4.8 3.5 - 5.1 mmol/L Final         Passed - Ca in normal range and within 180 days    Calcium   Date Value Ref Range Status  09/19/2023 9.4 8.9 - 10.3 mg/dL Final   Calcium , Ion  Date Value Ref Range Status  04/13/2022 1.21 1.15 - 1.40 mmol/L Final  04/13/2022 1.12 (L) 1.15 - 1.40 mmol/L Final         Passed - Na in normal range and within 180 days    Sodium  Date Value Ref Range Status  09/19/2023 139 135 - 145 mmol/L Final  07/29/2023 140 134 - 144 mmol/L Final         Passed - Cl in normal range and within 180 days    Chloride  Date Value Ref Range Status  09/19/2023 109 98 - 111 mmol/L Final         Passed - Mg Level in normal range and within 180 days    Magnesium   Date Value Ref Range Status  09/19/2023 2.0 1.7 - 2.4 mg/dL Final    Comment:    Performed at [facility], [address] [place], [place] [address]         Passed - Last BP in normal range    BP Readings from Last 1 Encounters:  09/20/23 120/89         Passed - Valid encounter within last 6 months  Recent Outpatient Visits           3 months ago Hospital discharge follow-up   [facility] Primary Care at [facility], [place], [place]   9 months ago Runny nose   [place] Primary Care at [facility], [place], [place]   1 year ago Chronic bilateral low back pain, unspecified whether sciatica present   [facility] Primary Care at [facility], [place], [place]   1 year ago Annual physical exam   [facility] at [facility], [place], [place]   1 year ago Encounter to establish care   [facility] Care at [facility], [place]e Barren, NP       Future Appointments              In 1 month Katheryne Pane, Frederico Jan, MD [facility] HeartCare at [facility] Dept of [facility] [place] [facility], [facility]             triamcinolone  (KENALOG ) 0.025 % cream 60 g 1    Sig: Apply 1 Application topically 2 (two) times daily.     Not Delegated - Dermatology:  Corticosteroids Failed - 11/24/2023 12:20 PM      Failed - This refill cannot be delegated      Passed - Valid encounter within last 12 months    Recent Outpatient Visits           3 months ago Hospital discharge follow-up   [facility] at [facility], [place], [place]   9 months ago Runny nose   [place] Primary Care at [facility], [place], [place]   1 year ago Chronic bilateral low back pain, unspecified whether sciatica present   [facility] Health Primary Care at [facility], [place], [place]   1 year ago Annual physical exam   [place] Primary Care at [facility], [place], [place]   1 year ago Encounter to establish care   [facility] Primary Care at [facility], [place]e Barren, NP       Future Appointments             In 1 month Katheryne Pane, Frederico Jan, MD [facility] HeartCare at [facility] Dept of [facility] [place] [facility], [facility]

## 2023-11-25 ENCOUNTER — Other Ambulatory Visit: Payer: Self-pay | Admitting: Family

## 2023-11-25 DIAGNOSIS — L309 Dermatitis, unspecified: Secondary | ICD-10-CM

## 2023-11-25 MED ORDER — TRIAMCINOLONE ACETONIDE 0.025 % EX CREA: 1.0000 | TOPICAL_CREAM | Freq: Two times a day (BID) | CUTANEOUS | 2 refills | Status: DC

## 2023-11-25 NOTE — Telephone Encounter (Signed)
 I called patient and made him aware that Established with [facility] for management of Furosemide . Request refills from the same.  - Triamcinolone  cream prescribed.

## 2023-11-25 NOTE — Telephone Encounter (Signed)
-   Established with [facility] for management of Furosemide . Request refills from the same.  - Triamcinolone  cream prescribed.

## 2023-11-27 NOTE — Telephone Encounter (Signed)
 Noted.

## 2023-12-11 ENCOUNTER — Other Ambulatory Visit (HOSPITAL_COMMUNITY): Payer: Self-pay

## 2023-12-11 ENCOUNTER — Ambulatory Visit: Payer: MEDICAID | Admitting: Family

## 2023-12-12 ENCOUNTER — Other Ambulatory Visit (HOSPITAL_COMMUNITY): Payer: Self-pay

## 2023-12-12 ENCOUNTER — Other Ambulatory Visit (HOSPITAL_COMMUNITY): Payer: Self-pay | Admitting: Cardiology

## 2023-12-12 ENCOUNTER — Telehealth (HOSPITAL_COMMUNITY): Payer: Self-pay | Admitting: Cardiology

## 2023-12-12 MED ORDER — FUROSEMIDE 40 MG PO TABS
40.0000 mg | ORAL_TABLET | Freq: Every day | ORAL | 0 refills | Status: DC
  Filled 2023-12-12: qty 30, 30d supply, fill #0

## 2023-12-12 NOTE — Telephone Encounter (Signed)
 Called to confirm/remind patient of their appointment at the [facility] on 12/12/2023.   Appointment:   [x] Confirmed  [] Left mess   [] No answer/No voice mail  [] VM Full/unable to leave message  [] Phone not in service  Patient reminded to bring all medications and/or complete list.  Confirmed patient has transportation. Gave directions, instructed to utilize valet parking.

## 2023-12-13 ENCOUNTER — Ambulatory Visit (HOSPITAL_COMMUNITY): Payer: Self-pay | Admitting: Cardiology

## 2023-12-13 ENCOUNTER — Other Ambulatory Visit (HOSPITAL_COMMUNITY): Payer: Self-pay

## 2023-12-13 ENCOUNTER — Ambulatory Visit (HOSPITAL_COMMUNITY)
Admission: RE | Admit: 2023-12-13 | Discharge: 2023-12-13 | Disposition: A | Discharge status: Home / Self Care | Payer: MEDICAID | Source: Ambulatory Visit | Attending: Cardiology | Admitting: Cardiology

## 2023-12-13 VITALS — BP 152/84 | HR 85 | Wt 159.2 lb

## 2023-12-13 DIAGNOSIS — I2510 Atherosclerotic heart disease of native coronary artery without angina pectoris: Secondary | ICD-10-CM | POA: Diagnosis not present

## 2023-12-13 DIAGNOSIS — Q232 Congenital mitral stenosis: Secondary | ICD-10-CM | POA: Insufficient documentation

## 2023-12-13 DIAGNOSIS — I252 Old myocardial infarction: Secondary | ICD-10-CM | POA: Diagnosis not present

## 2023-12-13 DIAGNOSIS — I5022 Chronic systolic (congestive) heart failure: Secondary | ICD-10-CM | POA: Insufficient documentation

## 2023-12-13 DIAGNOSIS — Z79899 Other long term (current) drug therapy: Secondary | ICD-10-CM | POA: Insufficient documentation

## 2023-12-13 DIAGNOSIS — F17210 Nicotine dependence, cigarettes, uncomplicated: Secondary | ICD-10-CM | POA: Insufficient documentation

## 2023-12-13 DIAGNOSIS — I110 Hypertensive heart disease with heart failure: Secondary | ICD-10-CM | POA: Diagnosis not present

## 2023-12-13 DIAGNOSIS — R42 Dizziness and giddiness: Secondary | ICD-10-CM | POA: Diagnosis not present

## 2023-12-13 DIAGNOSIS — Z7984 Long term (current) use of oral hypoglycemic drugs: Secondary | ICD-10-CM | POA: Insufficient documentation

## 2023-12-13 DIAGNOSIS — Z8673 Personal history of transient ischemic attack (TIA), and cerebral infarction without residual deficits: Secondary | ICD-10-CM | POA: Insufficient documentation

## 2023-12-13 DIAGNOSIS — F1410 Cocaine abuse, uncomplicated: Secondary | ICD-10-CM | POA: Insufficient documentation

## 2023-12-13 DIAGNOSIS — R0601 Orthopnea: Secondary | ICD-10-CM | POA: Diagnosis not present

## 2023-12-13 DIAGNOSIS — I050 Rheumatic mitral stenosis: Secondary | ICD-10-CM | POA: Diagnosis not present

## 2023-12-13 DIAGNOSIS — Z7901 Long term (current) use of anticoagulants: Secondary | ICD-10-CM | POA: Insufficient documentation

## 2023-12-13 DIAGNOSIS — I5042 Chronic combined systolic (congestive) and diastolic (congestive) heart failure: Secondary | ICD-10-CM | POA: Diagnosis not present

## 2023-12-13 LAB — BASIC METABOLIC PANEL WITH GFR
Anion gap: 7 (ref 5–15)
BUN: 27 mg/dL — ABNORMAL HIGH (ref 6–20)
CO2: 24 mmol/L (ref 22–32)
Calcium: 9.3 mg/dL (ref 8.9–10.3)
Chloride: 109 mmol/L (ref 98–111)
Creatinine, Ser: 1.7 mg/dL — ABNORMAL HIGH (ref 0.61–1.24)
GFR, Estimated: 48 mL/min — ABNORMAL LOW (ref >60)
Glucose, Bld: 67 mg/dL — ABNORMAL LOW (ref 70–99)
Potassium: 4.5 mmol/L (ref 3.5–5.1)
Sodium: 140 mmol/L (ref 135–145)

## 2023-12-13 LAB — LIPID PANEL
Cholesterol: 185 mg/dL (ref 0–200)
HDL: 64 mg/dL (ref >40)
LDL Cholesterol: 113 mg/dL — ABNORMAL HIGH (ref 0–99)
Total CHOL/HDL Ratio: 2.9 ratio
Triglycerides: 40 mg/dL (ref <150)
VLDL: 8 mg/dL (ref 0–40)

## 2023-12-13 LAB — CBC
HCT: 44.5 % (ref 39.0–52.0)
Hemoglobin: 14 g/dL (ref 13.0–17.0)
MCH: 28.4 pg (ref 26.0–34.0)
MCHC: 31.5 g/dL (ref 30.0–36.0)
MCV: 90.3 fL (ref 80.0–100.0)
Platelets: 279 10*3/uL (ref 150–400)
RBC: 4.93 MIL/uL (ref 4.22–5.81)
RDW: 18.9 % — ABNORMAL HIGH (ref 11.5–15.5)
WBC: 7.3 10*3/uL (ref 4.0–10.5)
nRBC: 0 % (ref 0.0–0.2)

## 2023-12-13 LAB — BRAIN NATRIURETIC PEPTIDE: B Natriuretic Peptide: 354.8 pg/mL — ABNORMAL HIGH (ref 0.0–100.0)

## 2023-12-13 MED ORDER — ENTRESTO 49-51 MG PO TABS: 1.0000 | ORAL_TABLET | Freq: Two times a day (BID) | ORAL | 11 refills | Status: AC

## 2023-12-13 MED ORDER — FUROSEMIDE 20 MG PO TABS: 20.0000 mg | ORAL_TABLET | Freq: Every day | ORAL | 3 refills | Status: DC

## 2023-12-13 MED ORDER — ROSUVASTATIN CALCIUM 10 MG PO TABS: 10.0000 mg | ORAL_TABLET | Freq: Every day | ORAL | 3 refills | Status: DC

## 2023-12-13 MED ORDER — CARVEDILOL 3.125 MG PO TABS: 3.1250 mg | ORAL_TABLET | Freq: Two times a day (BID) | ORAL | 3 refills | Status: DC

## 2023-12-13 NOTE — Patient Instructions (Signed)
 INCREASE Entresto  to 49/51 mg Twice daily  RESTART Carvedilol  3.125 mg Twice daily  RESTART Lasix  at 20 mg daily.  START Crestor  10 mg daily.  STOP Asprin   Labs done today, your results will be available in MyChart, we will contact you for abnormal readings.  Repeat blood work in 10 days.  Your physician recommends that you schedule a follow-up appointment in: 3 weeks.  If you have any questions or concerns before your next appointment please send us  a message through [place] or call our office at [phone].    TO LEAVE A MESSAGE FOR THE NURSE SELECT OPTION 2, PLEASE LEAVE A MESSAGE INCLUDING: YOUR NAME DATE OF BIRTH CALL BACK NUMBER REASON FOR CALL**this is important as we prioritize the call backs  YOU WILL RECEIVE A CALL BACK THE SAME DAY AS LONG AS YOU CALL BEFORE 4:00 PM  At the [facility], you and your health needs are our priority. As part of our continuing mission to provide you with exceptional heart care, we have created designated Provider Care Teams. These Care Teams include your primary Cardiologist (physician) and Advanced Practice Providers (APPs- Physician Assistants and Nurse Practitioners) who all work together to provide you with the care you need, when you need it.   You may see any of the following providers on your designated Care Team at your next follow up: Dr Jules Oar Dr Peder Bourdon Dr. Alwin Baars Dr. Arta Lark Amy Marijane Shoulders, NP Ruddy Corral, [place] [facility] Solomon, [place] Dennise Fitz, NP [place] Lee, NP Shawnee Dellen, NP Luster Salters, PharmD Bevely Brush, PharmD   Please be sure to bring in all your medications bottles to every appointment.    Thank you for choosing [facility]

## 2023-12-14 NOTE — Progress Notes (Signed)
 -  [facility] Note   Primary Care: Senaida Dama, NP HF Cardiologist: Dr. Mitzie Anda  Chief complaint: CHF  HPI: Craig Schmidt is a 54 y.o. 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   Admitted 02/23 with left MCA CVA s/p tenecteplase . Stroke felt to be likely secondary to cardiomyopathy. Echo EF 25-30%,k RV mildly reduced, RVSP 50 mmHg, moderate MS, mild to moderate MR, moderate to severe TR, dilated IVC. TEE 02/23: EF 25-30%, RV severely reduced, MV stenotic (? Parachute mitral valve), MVA 1.45 cm2 consistent with severe MS, moderate MR, no interatrial shunt, no LV or LAA thrombus. Cardiology consulted. CM likely d/t subtance abuse +/- uncontrolled HTN. Had been started on coumadin  but later switched to eliquis  d/t concern for compliance given his social situation.  Admitted 5/23 with acute inferolateral STEMI. Had used cocaine  24 hrs prior to presentation. Emergent LHC with 100% apical LAD suspected embolic vs plaque rupture with thrombosis. LVEDP 40. Echo this admit showed EF < 20%, ? RV okay. He was significantly volume overloaded and hypertensive. Started on lasix  gtt and nitro gtt. Had good diuresis but Scr began to trend up, 1.5>1.77>1.87>2.0.  Developed soft BP and BP-active meds held. There was concern for low-output. PICC placed and Co-ox within normal range. Drips weaned and GDMT started. He was discharged home, weight 156 lbs.  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   R/LHC (9/23) arranged to assess mitral stenosis, which showed primarily pulmonary venous hypertension, normal RA pressure and minimally elevated PCWP, and moderate mitral stenosis.   Admitted 5/24 with CHF,  2/2 running out of meds x several weeks. Echo showed EF 25-30%, RV mildly reduced, mild MR and mild MS. Diuresed with IV lasix  and GDMT titrated. Discharged home, weight 138 lbs.  Echo in 12/24 showed EF < 20%, mild-moderate LV dilation, moderate RV dysfunction/mild RV enlargement, severe LAE, moderate mitral stenosis with restricted leaflets.   Patient had a recurrent CVA in 2/25, right parietal area due to right MCA branch occlusion.   Patient returns for CHF followup.  He has significant residual effects from his prior CVAs with weakness in both arms.  He is now in an apartment with his girlfriend.  He is able to walk some in the apartment but is weak and unstable.  He is unable to manage a cane or walker due to arm weakness. He has not had any Lasix  x 2 wks (ran out).  Still smokes a few cigarettes/day.  Mild dyspnea with activities around the house.  Has orthopnea, sleeps in recliner.  No chest pain.  No lightheadedness. He has been working to stay off cocaine  but still uses occasionally.   ECG (personally reviewed): NSR, anterolateral TWIs  Labs (6/23): K 3.8, creatinine 1.75 Labs (7/23): K 4.3, creatinine 1.39 Labs (8/23): digoxin  0.2, K 4.9, creatinine 1.37 Labs (9/23): K 4.3, creatinine 1.26 Labs (11/23): K 4.1, creatinine 1.65 Labs (5/24): K 4.5, creatinine 2.02 Labs (7/24): K 4.2, creatinine 1.7 Labs (2/25): LDL 77, K 4.8, creatinine 9.60  PMH: 1. CVA: Left MCA CVA in 2/23, treated with tenecteplase .  Thought to be due to low EF/cardiomyopathy.  - Recurrent CVA 2/25: Right parietal embolic CVA (right MCA branch occlusion).  2. Left vestibular schwannoma.  3. Hyperlipidemia 4. H/o cocaine  abuse. 5. H/o ETOH abuse.  6. 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  Most recent echo in 9/23 showed moderate mitral stenosis with trivial MR.  - R/LHC (9/23) showed moderate mitral stenosis, mitral valve mean gradient 10.9 mmHg, MVA 1.74 cm^2 7. Chronic systolic CHF: Mixed  ischemic/nonischemic CMP.  Cocaine /ETOH abuse, HTN, CAD.  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 - Inferolateral MI 5/23 with LHC showing occlusion of the apical LAD.  - R/LHC (9/23): RA mean 4, PA 50/20 (mean 33), PCWP 15, CO/CI (Fick) 5.74/3.06, PVR 3.1 WU - Echo (5/24): EF 25-30%, RV mildly reduced, mild MR and mild MS. - Echo (12/24): EF < 20%, mild-moderate LV dilation, moderate RV dysfunction/mild RV enlargement, severe LAE, moderate mitral stenosis with restricted leaflets.  8. CAD: Inferolateral STEMI in 5/23 with LHC showing occluded apical LAD managed medically (?embolic vs plaque rupture).    Current Outpatient Medications  Medication Sig Dispense Refill   apixaban  (ELIQUIS ) 5 MG TABS tablet Take 1 tablet (5 mg total) by mouth 2 (two) times daily. 60 tablet 3   empagliflozin  (JARDIANCE ) 10 MG TABS tablet Take 1 tablet (10 mg total) by mouth daily. 30 tablet 2   nicotine  polacrilex (NICORETTE ) 2 MG gum Take 1 each (2 mg total) by mouth as needed for smoking cessation. 110 tablet 0   oxyCODONE -acetaminophen  (PERCOCET) 5-325 MG tablet Take 1 tablet by mouth every 6 (six) hours as needed. 15 tablet 0   rosuvastatin (CRESTOR) 10 MG tablet Take 1 tablet (10 mg total) by mouth daily. 90 tablet 3   sacubitril -valsartan  (ENTRESTO ) 49-51 MG Take 1 tablet by mouth 2 (two) times daily. 60 tablet 11   spironolactone  (ALDACTONE ) 25 MG tablet Take 0.5 tablets (12.5 mg total) by mouth daily. 15 tablet 2   triamcinolone  (KENALOG ) 0.025 % cream Apply 1 Application topically 2 (two) times daily. 60 g 2   carvedilol  (COREG ) 3.125 MG tablet Take 1 tablet (3.125 mg total) by mouth 2 (two) times  daily. 180 tablet 3   furosemide  (LASIX ) 20 MG tablet Take 1 tablet (20 mg total) by mouth daily. 90 tablet 3   No current facility-administered medications for this encounter.   Allergies  Allergen Reactions   Shellfish Allergy Anaphylaxis   Peanut (Diagnostic) Itching   Social History   Socioeconomic History   Marital status: Significant Other    Spouse name: Not on file   Number of children: 7   Years of education: Not on file   Highest education level: 9th grade  Occupational History   Occupation: unemployed    Comment: experiencing homelessness-living at daily rent motels.  Tobacco Use   Smoking status: Every Day    Current packs/day: 1.00    Average packs/day: 1 pack/day for 41.0 years (41.0 ttl pk-yrs)    Types: Cigarettes    Passive exposure: Current   Smokeless tobacco: Never  Vaping Use   Vaping status: Never Used  Substance and Sexual Activity   Alcohol  use: Yes    Alcohol /week: 63.0 standard drinks of alcohol     Types: 63 Cans of beer per week    Comment: 3-40oz beer/day x10  years. 1-2 40oz beer/day in 20s/30s   Drug use: Yes    Types: "Crack" cocaine , Cocaine     Comment: last use 01/13/22   Sexual activity: Not Currently  Other Topics Concern   Not on file  Social History Narrative   Not on file   Social Drivers of Health   Financial Resource Strain: High Risk (07/19/2023)   Overall Financial Resource Strain (CARDIA)    Difficulty of Paying Living Expenses: Very hard  Food Insecurity: Food Insecurity Present (09/04/2023)   Hunger Vital Sign    Worried About Running Out of Food in the Last Year: Sometimes true    Ran Out of Food in the Last Year: Sometimes true  Transportation Needs: Unmet Transportation Needs (09/04/2023)   PRAPARE - Transportation    Lack of Transportation (Medical): Yes    Lack of Transportation (Non-Medical): Yes  Physical Activity: Not on file  Stress: Stress Concern Present (10/16/2022)   [employer] of Occupational  Health - Occupational Stress Questionnaire    Feeling of Stress : To some extent  Social Connections: Not on file  Intimate Partner Violence: Not At Risk (09/04/2023)   Humiliation, Afraid, Rape, and Kick questionnaire    Fear of Current or Ex-Partner: No    Emotionally Abused: No    Physically Abused: No    Sexually Abused: No   Family History  Problem Relation Age of Onset   Diabetes Mother    BP (!) 152/84   Pulse 85   Wt 72.2 kg (159 lb 3.2 oz)   SpO2 97%   BMI 24.93 kg/m   Wt Readings from Last 3 Encounters:  12/13/23 72.2 kg (159 lb 3.2 oz)  09/04/23 68 kg (150 lb)  09/03/23 65.9 kg (145 lb 4.5 oz)   PHYSICAL EXAM: General: NAD, chronically ill-appearing Neck: No JVD, no thyromegaly or thyroid  nodule.  Lungs: Clear to auscultation bilaterally with normal respiratory effort. CV: Nondisplaced PMI.  Heart regular S1/S2, no S3/S4, no murmur.  No peripheral edema.  No carotid bruit.  Normal pedal pulses.  Abdomen: Soft, nontender, no hepatosplenomegaly, no distention.  Skin: Intact without lesions or rashes.  Neurologic: Alert and oriented x 3. Bilateral arm weakness.  Psych: Normal affect. Extremities: No clubbing or cyanosis.  HEENT: Normal.   ASSESSMENT & PLAN: 1. Chronic systolic CHF: Patient was found to have cardiomyopathy in 2/23 at time of admission for CVA.  Echo showed EF 25-30% at that time.  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  MI in 5/23 (?cardioembolic) also contributes.  Echo in 5/23 showed LV EF < 20%, RV mildly decreased function, abnormal mitral valve looks rheumatic => suspect moderate mitral stenosis at least with planimetered MVA 1.6 cm^2 and calculated MVA by VTI 0.8 cm^2; mean gradient only 6 mmHg but likely function of low cardiac output. Most recent echo in 12/24 showed EF < 20%, mild-moderate LV dilation, moderate RV dysfunction/mild RV enlargement, severe LAE, moderate mitral stenosis with  restricted leaflets. NYHA class III but symptoms confounded by weakness/unsteadiness from prior CVAs.  He does not look particularly volume overloaded.  - He has been out of Lasix  for about 2 wks.  I will restart him on a lower dose, 20 mg daily.  - Increase Entresto  to 49/51 bid, BMET/BNP today and BMET in 10 days.  - Continue Jardiance  10  mg daily. - Start Coreg  3.125 mg bid.  - Continue spironolactone  12.5 daily.  - If he can get  off cocaine , would be ICD candidate (narrow QRS so no CRT).  - Not candidate for advanced therapies with debility and active cocaine  use.  2. CAD: Admit 6/23 with inferolateral STEMI by ECG, cath showed occluded apical LAD.  Cannot rule out plaque rupture in setting of cocaine  abuse, but given history of suspected cardioembolic CVA, wonder if this was not a cardioembolic MI. No chest pain.   - Continue Eliquis  5 mg bid.  - He can stop ASA, do not need to take both this and Eliquis .  - Restart statin, Crestor 20 mg daily with lipids/LFTs in 2 months.  3. HTN: Elevated today.   - As above, starting Coreg  and increasing Entresto .  4. CVA: 2/23 and again in 2/25, has both right and left arm weakness.  Thought to be cardioembolic, started on anticoagulation (was off Eliquis  when he had the 2nd CVA).   - Continue Eliquis . 5. Cocaine  abuse: Ongoing use. Discussed cessation. He is trying to cut back.  6. 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   Echo 9/23 shows moderate MS with trivial MR. Unusual given age. He denies known rheumatic fever though he remembers have strep throat at least twice as a child. L/RHC (9/23) showed moderate mitral stenosis, with mitral valve mean gradient 10.9 mmHg, MVA 1.74 cm^2.  Echo in 12/24 also showed moderate mitral stenosis.  - He would not be a candidate for surgical mitral valve replacement.  7. Vestibular schwannoma: Has had vertigo and falls. No falls recently.    Follow up in 3 wks with APP.   I  spent 32 minutes reviewing records, interviewing/examining patient, and managing orders.   Craig Schmidt  12/14/2023

## 2023-12-17 MED ORDER — FUROSEMIDE 20 MG PO TABS: 20.0000 mg | ORAL_TABLET | ORAL | Status: AC | PRN

## 2023-12-17 NOTE — Telephone Encounter (Addendum)
 Pt aware, agreeable, and verbalized understanding  Labs already scheduled and med list updated   ----- Message from Peder Bourdon sent at 12/13/2023  4:39 PM EDT ----- LDL high but he is going to restart statin.  Creatinine higher.  Would increase Entresto  as planned but have him use Lasix  only prn (rather than daily).  BMET 10 days.

## 2023-12-18 ENCOUNTER — Ambulatory Visit (INDEPENDENT_AMBULATORY_CARE_PROVIDER_SITE_OTHER): Payer: MEDICAID | Admitting: Family

## 2023-12-18 DIAGNOSIS — R159 Full incontinence of feces: Secondary | ICD-10-CM

## 2023-12-18 DIAGNOSIS — L309 Dermatitis, unspecified: Secondary | ICD-10-CM

## 2023-12-18 DIAGNOSIS — R32 Unspecified urinary incontinence: Secondary | ICD-10-CM

## 2023-12-18 MED ORDER — MALE URINAL MISC
1.0000 | Freq: Every day | 2 refills | Status: AC | PRN
Start: 2023-12-18 — End: ?

## 2023-12-18 MED ORDER — TRIAMCINOLONE ACETONIDE 0.025 % EX CREA
1.0000 | TOPICAL_CREAM | Freq: Two times a day (BID) | CUTANEOUS | 2 refills | Status: DC
Start: 2023-12-18 — End: 2023-12-20

## 2023-12-18 MED ORDER — CVS DISPOSABLE BED MATS MISC: 1.0000 | Freq: Every day | 11 refills | Status: DC | PRN

## 2023-12-18 MED ORDER — PROCARE ADULT BRIEFS LARGE MISC: 1.0000 | Freq: Every day | 11 refills | Status: DC | PRN

## 2023-12-18 NOTE — Progress Notes (Signed)
 Virtual Visit via Telephone Note  I connected with Craig Schmidt, on 12/18/2023 at 3:46 PM by telephone and verified that I am speaking with the correct person using two identifiers.  Consent: I discussed the limitations, risks, security and privacy concerns of performing an evaluation and management service by telephone and the availability of in person appointments. I also discussed with the patient that there may be a patient responsible charge related to this service. The patient expressed understanding and agreed to proceed.   Location of Patient: Home  Location of Provider: [facility]   Persons participating in Telemedicine visit: MATTHEU BRODERSEN Lavona Pounds, NP Alona Jamaica, CMA   History of Present Illness: Craig Schmidt is a 53 y.o. male who presents for incontinence follow-up. Reports urine and bowel incontinence. States he needs adult briefs size large, bed pads, a urinal, and a shower chair. Doing well on Triamcinolone , no issues/concerns. No further issues/concerns for discussion today.   Past Medical History:  Diagnosis Date   Alcohol  abuse    Asthma    Brain tumor (HCC)    CAD (coronary artery disease)    Chronic HFrEF (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)    Chronic kidney disease, stage 3 (HCC)    Cocaine  abuse (HCC)    History of medication noncompliance    Homelessness    MI (myocardial infarction) (HCC)    STEMI (ST elevation myocardial infarction) (HCC)    Stroke (HCC)    Allergies  Allergen Reactions   Shellfish Allergy Anaphylaxis   Peanut (Diagnostic) Itching    Current Outpatient Medications on File Prior to Visit  Medication Sig Dispense Refill   apixaban  (ELIQUIS ) 5 MG TABS tablet Take 1 tablet (5 mg total) by mouth 2 (two) times daily. 60 tablet 3   carvedilol  (COREG ) 3.125 MG tablet Take 1 tablet (3.125 mg total) by mouth 2 (two) times daily. 180 tablet 3   empagliflozin   (JARDIANCE ) 10 MG TABS tablet Take 1 tablet (10 mg total) by mouth daily. 30 tablet 2   furosemide  (LASIX ) 20 MG tablet Take 1 tablet (20 mg total) by mouth as needed. For weight gain of 3lb in 24 hours or 5 lb in a week     nicotine  polacrilex (NICORETTE ) 2 MG gum Take 1 each (2 mg total) by mouth as needed for smoking cessation. 110 tablet 0   oxyCODONE -acetaminophen  (PERCOCET) 5-325 MG tablet Take 1 tablet by mouth every 6 (six) hours as needed. 15 tablet 0   rosuvastatin (CRESTOR) 10 MG tablet Take 1 tablet (10 mg total) by mouth daily. 90 tablet 3   sacubitril -valsartan  (ENTRESTO ) 49-51 MG Take 1 tablet by mouth 2 (two) times daily. 60 tablet 11   spironolactone  (ALDACTONE ) 25 MG tablet Take 0.5 tablets (12.5 mg total) by mouth daily. 15 tablet 2   triamcinolone  (KENALOG ) 0.025 % cream Apply 1 Application topically 2 (two) times daily. 60 g 2   No current facility-administered medications on file prior to visit.    Observations/Objective: Alert and oriented x 3. Not in acute distress. Physical examination not completed as this is a telemedicine visit.  Assessment and Plan: 1. Urinary incontinence, unspecified type (Primary) 2. Incontinence of feces, unspecified fecal incontinence type - Adult briefs, disposable bed mats, male urinal, and shower chair as prescribed. - Follow-up with primary provider as scheduled. - Incontinence Supply Disposable (PROCARE ADULT BRIEFS LARGE) MISC; 1 each by Does not apply route daily as needed.  Dispense:  25 each; Refill: 11 - Incontinence Supplies (MALE URINAL) MISC; 1 each by Does not apply route daily as needed.  Dispense: 1 each; Refill: 2 - Incontinence Supply Disposable ([employer] DISPOSABLE BED MATS) MISC; 1 each by Does not apply route daily as needed.  Dispense: 9 each; Refill: 11 - Shower chair  3. Eczema, unspecified type - Triamcinolone  cream as prescribed. Counseled on medication adherence/adverse effects.  - Follow-up with primary provider as  scheduled. - triamcinolone  (KENALOG ) 0.025 % cream; Apply 1 Application topically 2 (two) times daily.  Dispense: 454 g; Refill: 2   Follow Up Instructions: Follow-up with primary provider as scheduled.   Patient was given clear instructions to go to Emergency Department or return to medical center if symptoms don't improve, worsen, or new problems develop.The patient verbalized understanding.  I discussed the assessment and treatment plan with the patient. The patient was provided an opportunity to ask questions and all were answered. The patient agreed with the plan and demonstrated an understanding of the instructions.   The patient was advised to call back or seek an in-person evaluation if the symptoms worsen or if the condition fails to improve as anticipated.    I provided 8 minutes total of non-face-to-face time during this encounter.   Senaida Dama, NP  [facility] [place], [place] [phone] 12/18/2023, 3:13 PM

## 2023-12-20 ENCOUNTER — Other Ambulatory Visit: Payer: Self-pay | Admitting: Family

## 2023-12-20 ENCOUNTER — Telehealth: Payer: Self-pay

## 2023-12-20 DIAGNOSIS — L309 Dermatitis, unspecified: Secondary | ICD-10-CM

## 2023-12-20 MED ORDER — TRIAMCINOLONE ACETONIDE 0.025 % EX CREA
1.0000 | TOPICAL_CREAM | Freq: Two times a day (BID) | CUTANEOUS | 2 refills | Status: AC
Start: 2023-12-20 — End: ?

## 2023-12-20 NOTE — Telephone Encounter (Signed)
 Copied from CRM [id number]. Topic: Clinical - Prescription Issue >> Dec 19, 2023  3:58 PM Oddis Bench wrote: Reason for CRM: Patient is calling about triamcinolone  (KENALOG ) 0.025 % cream he is stating that the pharmacy will not fill the new script until June and told the patient to call in and have it updated to sooner bc the current script size is too small.

## 2023-12-20 NOTE — Telephone Encounter (Signed)
 Complete

## 2023-12-23 ENCOUNTER — Other Ambulatory Visit (HOSPITAL_COMMUNITY): Payer: MEDICAID

## 2023-12-23 ENCOUNTER — Ambulatory Visit: Payer: Self-pay

## 2023-12-23 ENCOUNTER — Encounter (HOSPITAL_COMMUNITY): Payer: MEDICAID

## 2023-12-23 NOTE — Telephone Encounter (Signed)
 CRM routed to [employer]. I did notice that Amy seen him for these concerns on [phone]. Could you assist patient with his needs.

## 2023-12-23 NOTE — Telephone Encounter (Unsigned)
 Copied from CRM [id number]. Topic: Clinical - Prescription Issue >> Dec 20, 2023  1:59 PM DeAngela L wrote: Reason for CRM: Patient calling to inform the Dr office that a prescription for incontinence supplies was sent to [id number] - [place], [place] - [address] AT [address] [place] [address] Phone: [phone] Fax: [phone] And [facility] informed the patient that they don't fill those kinds of prescriptions and it needs to go to different type of pharmacy, Patient calling to find out if the prescription for shower chair bed pads and adult diapers could be sent to a medical supply phamacy And also would like to ask if the a home delivery for these items A Asking if they could send [facility] [phone] he used this one before  Twyla Galeazzi [phone] (M)

## 2023-12-23 NOTE — Telephone Encounter (Signed)
 Copied from CRM [id number]. Topic: Clinical - Prescription Issue >> Dec 20, 2023  1:59 PM DeAngela L wrote: Reason for CRM: Patient calling to inform the Dr office that a prescription for incontinence supplies was sent to [id number] - [place], [place] - [address] AT [address] [place] [address] Phone: [phone] Fax: [phone] And [facility] informed the patient that they don't fill those kinds of prescriptions and it needs to go to different type of pharmacy, Patient calling to find out if the prescription for shower chair bed pads and adult diapers could be sent to a medical supply phamacy And also would like to ask if the a home delivery for these items A Asking if they could send [employer] [facility] supplies [phone] he used this one before  Twyla Galeazzi [phone] (M)

## 2023-12-25 NOTE — Telephone Encounter (Signed)
Order has been faxed to [facility] ?

## 2023-12-26 IMAGING — MR MR HEAD W/O CM
10 of 11 series · 42 of 48 positions shown · non-contrast
Comparison: Prior CT and CTA from earlier the same day. Comparison
also made with previous MRI from 05/17/2015.

CLINICAL DATA: Initial evaluation for neuro deficit, stroke
suspected.

EXAM:
MRI HEAD WITHOUT CONTRAST
TECHNIQUE: Multiplanar, multiecho pulse sequences of the brain and surrounding
structures were obtained without intravenous contrast.

[Series 5: DWI · axial · 3.0mm · 0.88mm/px · z∈[-65,+76]mm · 9 of 96 slices shown (1 of 4)]
[im 1/96]
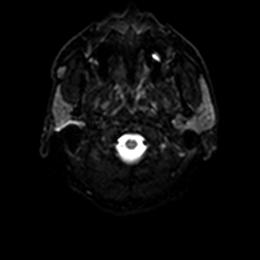
[im 12/96]
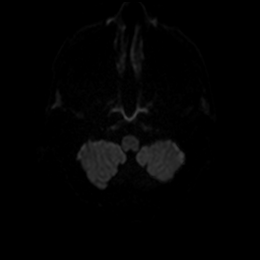
[im 24/96]
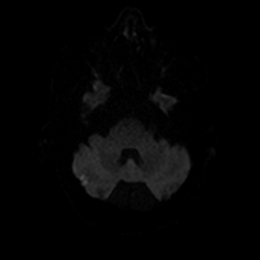
[im 36/96]
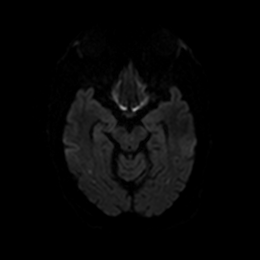
[im 48/96]
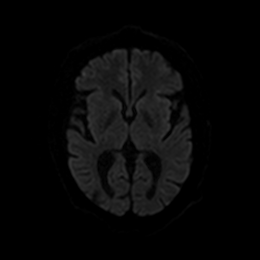
[im 60/96]
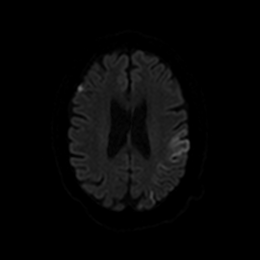
[im 72/96]
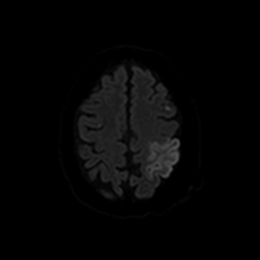
[im 84/96]
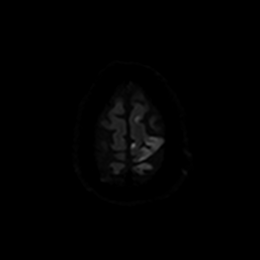
[im 96/96]
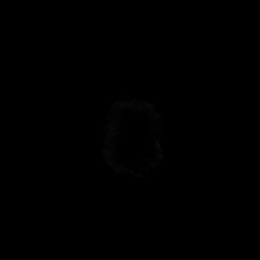

[Series 6: DWI · axial · 3.0mm · 0.88mm/px · z∈[-65,+76]mm · 4 of 48 slices shown (2 of 4)]
[im 1/48]
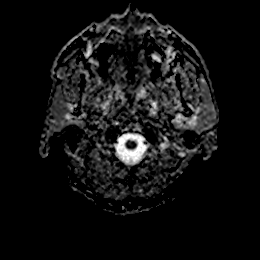
[im 16/48]
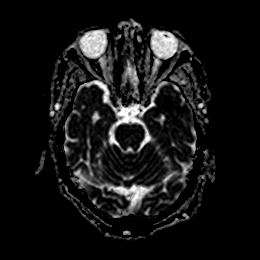
[im 32/48]
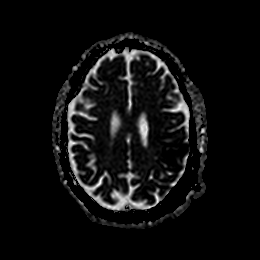
[im 48/48]
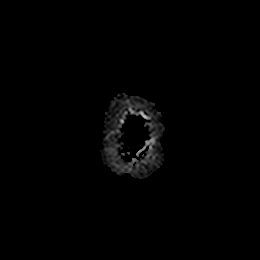

[Series 7: DWI · coronal · 4.0mm · 0.88mm/px · 6 of 66 slices shown (3 of 4)]
[im 1/66]
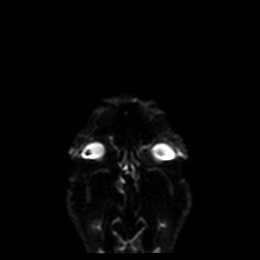
[im 14/66]
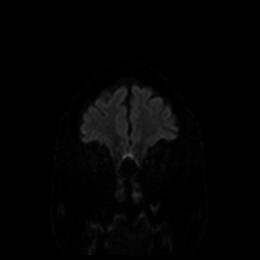
[im 27/66]
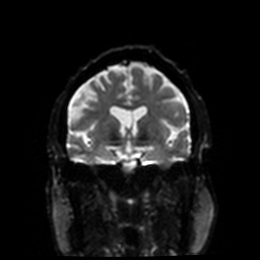
[im 40/66]
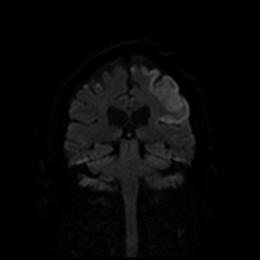
[im 53/66]
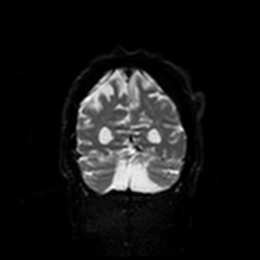
[im 66/66]
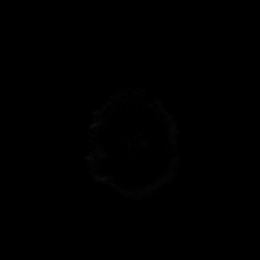

[Series 8: DWI · coronal · 4.0mm · 0.88mm/px · 3 of 33 slices shown (4 of 4)]
[im 1/33]
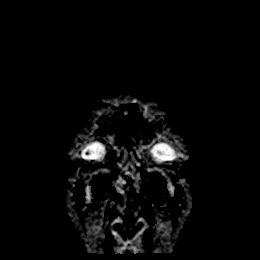
[im 17/33]
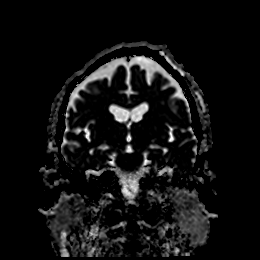
[im 33/33]
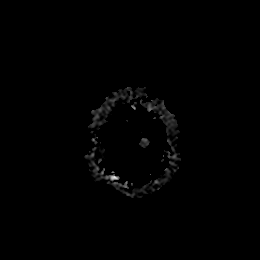

[Series 9: T1 · sagittal · 5.0mm · 0.75mm/px · 2 of 23 slices shown]
[im 1/23]
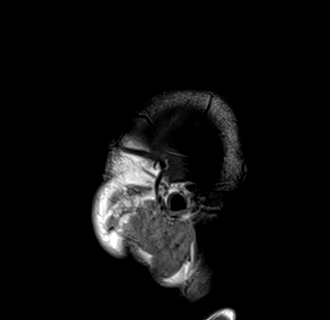
[im 23/23]
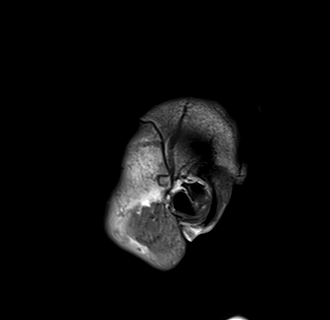

[Series 10: T2 · axial · 5.0mm · 0.72mm/px · z∈[-67,+77]mm · 2 of 25 slices shown (1 of 2)]
[im 1/25]
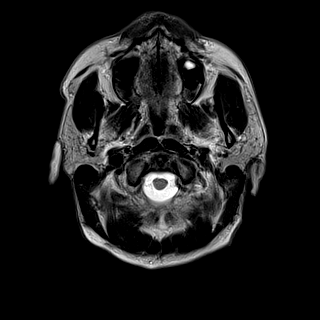
[im 25/25]
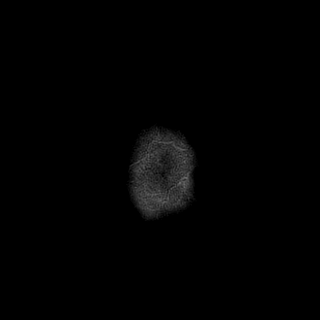

[Series 11: FLAIR · axial · 5.0mm · 0.45mm/px · z∈[-66,+78]mm · 2 of 25 slices shown]
[im 1/25]
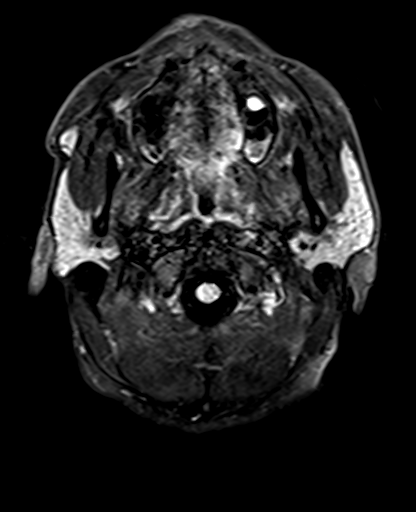
[im 25/25]
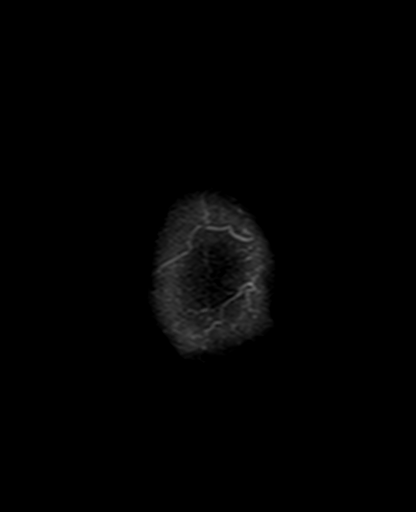

[Series 13: pha_images · axial · 3.0mm · 0.90mm/px · z∈[-80,+91]mm · 5 of 58 slices shown]
[im 1/58]
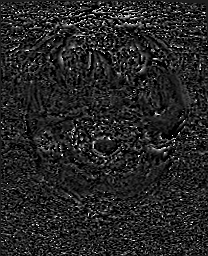
[im 15/58]
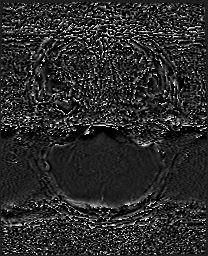
[im 29/58]
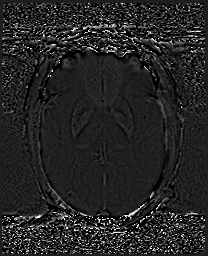
[im 43/58]
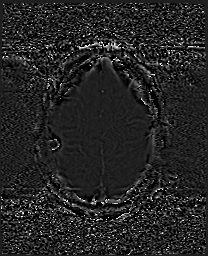
[im 58/58]
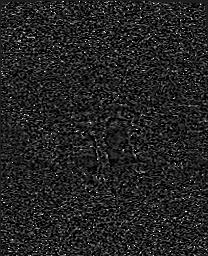

[Series 14: swi_images · axial · 3.0mm · 0.90mm/px · z∈[-83,+94]mm · 6 of 60 slices shown]
[im 1/60]
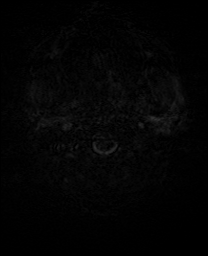
[im 12/60]
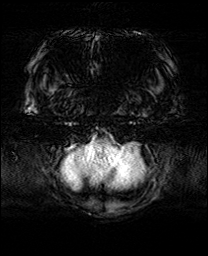
[im 24/60]
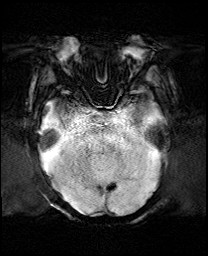
[im 36/60]
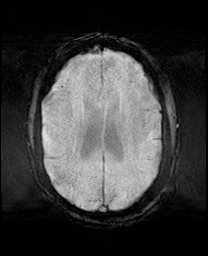
[im 48/60]
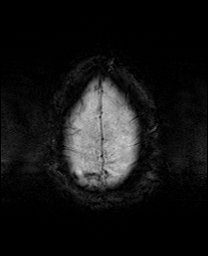
[im 60/60]
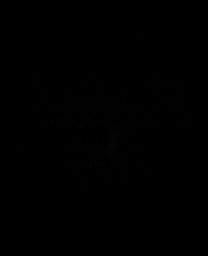

[Series 17: T2 · coronal · 5.0mm · 0.34mm/px · 3 of 29 slices shown (2 of 2)]
[im 1/29]
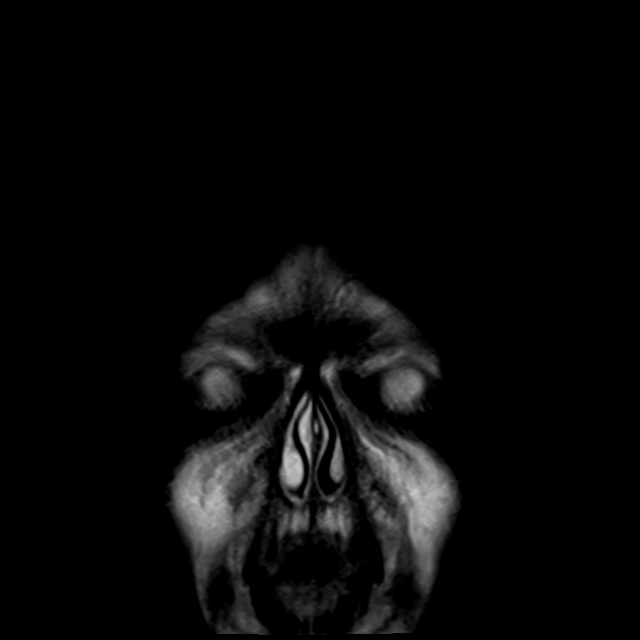
[im 15/29]
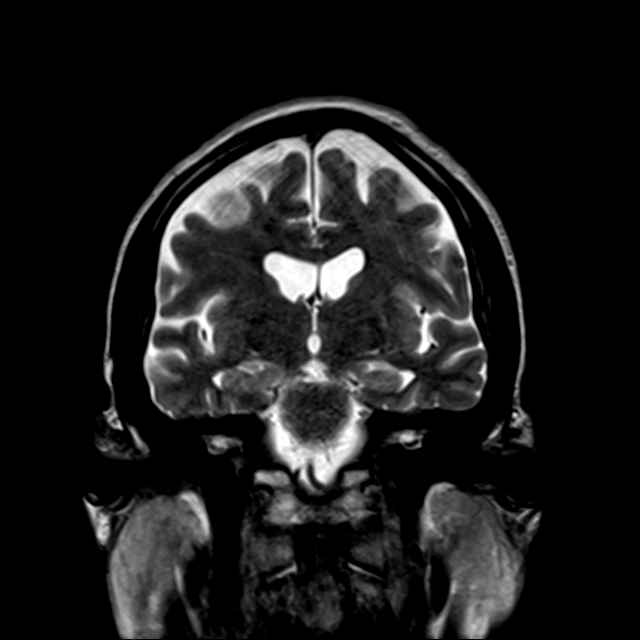
[im 29/29]
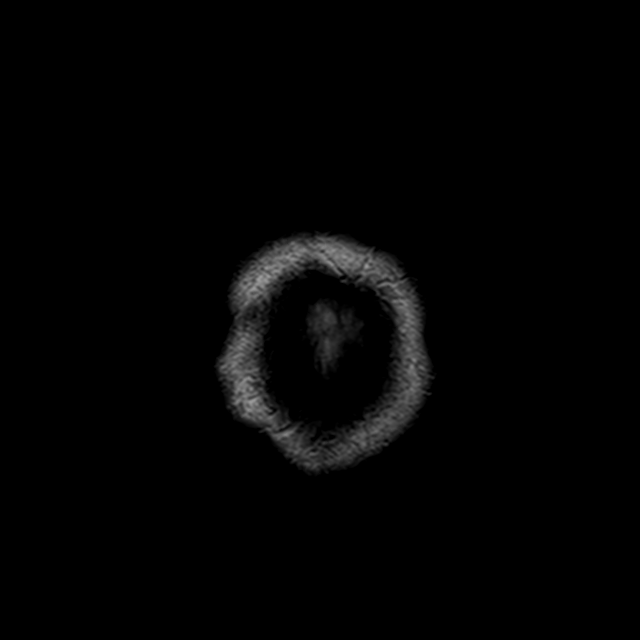

[42 of 48 positions shown; findings below may reference images not displayed]

FINDINGS: Brain: Cerebral volume within normal limits for age. Mild scattered
patchy subcentimeter T2/FLAIR hyperintensity noted involving the
supratentorial cerebral white matter, nonspecific, but most like
related chronic microvascular ischemic disease, mild in nature.
Small remote cortical infarct with associated hemosiderin staining
noted at the right occipital lobe. Additional tiny remote left
cerebellar infarct noted.

Patchy multifocal areas of restricted diffusion seen involving the
left greater than right cerebral hemispheres, most pronounced at the
posterior left frontal parietal region (series 5, image 86).
Diffusion signal abnormality primarily involves the cortical gray
matter. Few patchy foci in noted involving the bilateral cerebellar
hemispheres as well. No associated hemorrhage or significant
regional mass effect. A central thromboembolic etiology is suspected
given the various vascular distributions involved.

Otherwise, gray-white matter differentiation maintained. No other
areas of chronic cortical infarction. No other evidence for acute or
chronic intracranial hemorrhage.

Known left vestibular schwannoma measures up to approximately
cm, similar to previous. No associated mass effect. Mega cisterna
magna again noted. No other mass lesion, midline shift or mass
effect. No hydrocephalus or extra-axial fluid collection. Cavum et
septum pellucidum again noted. Pituitary gland suprasellar region
within normal limits. Midline structures intact.

Vascular: Major intracranial vascular flow voids are maintained.

Skull and upper cervical spine: Craniocervical junction within
normal limits. Bone marrow signal intensity within normal limits. No
scalp soft tissue abnormality.

Sinuses/Orbits: Globes and orbital soft tissues within normal
limits. Paranasal sinuses are largely clear. No mastoid effusion.

Other: None.
IMPRESSION: 1. Patchy multifocal acute ischemic nonhemorrhagic infarcts
involving the left greater than right cerebral hemispheres and
cerebellum. A central thromboembolic etiology is suspected given the
various vascular distributions involved.
2. Underlying mild chronic microvascular ischemic disease with small
remote right occipital and left cerebellar infarcts.
3. 1.4 cm left vestibular schwannoma, similar to previous.

## 2023-12-27 IMAGING — CT CT HEAD W/O CM
4 series · 16 of 47 positions shown, 18 images · non-contrast
Comparison: Brain MRI, head CT and CTA head and neck yesterday.

CLINICAL DATA: 51-year-old male code stroke presentation yesterday.
Acute bilateral cerebral and cerebellar infarcts on MRI.



[Series 3: head wo · axial · 0.48mm/px · z∈[-74,+46]mm · 7 of 34 slices shown, 9 images]
[im 5/34  brain]
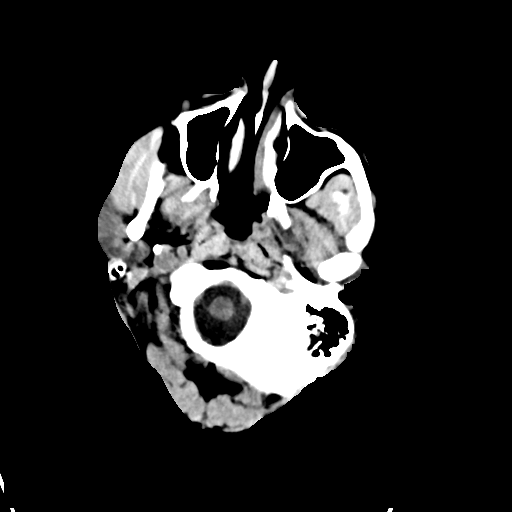
[im 5/34  bone]
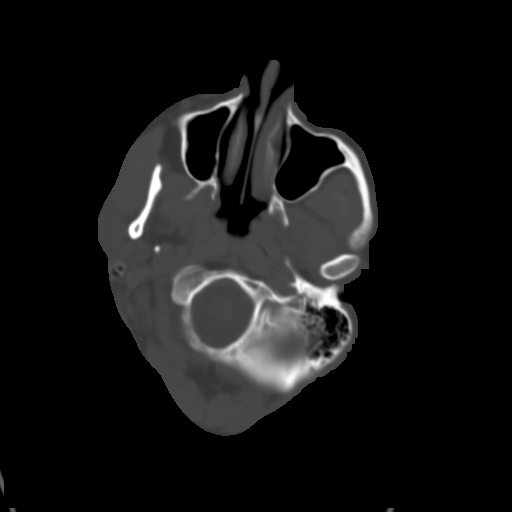
[im 9/34  brain]
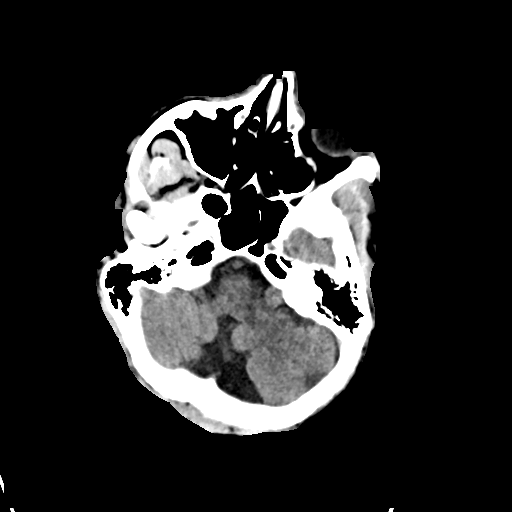
[im 13/34  brain]
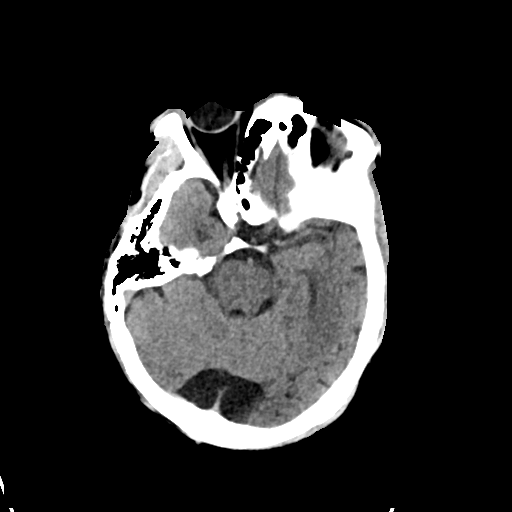
[im 17/34  brain]
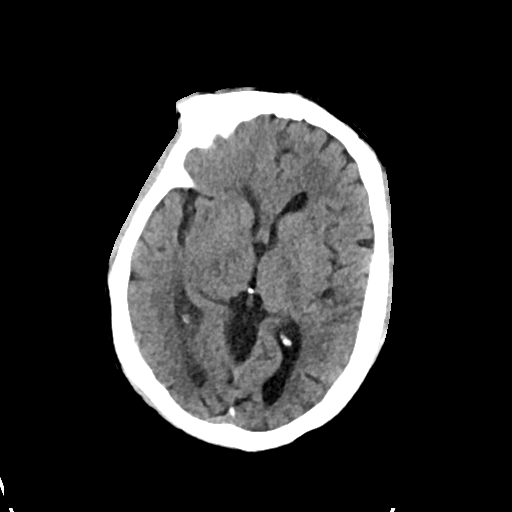
[im 21/34  brain]
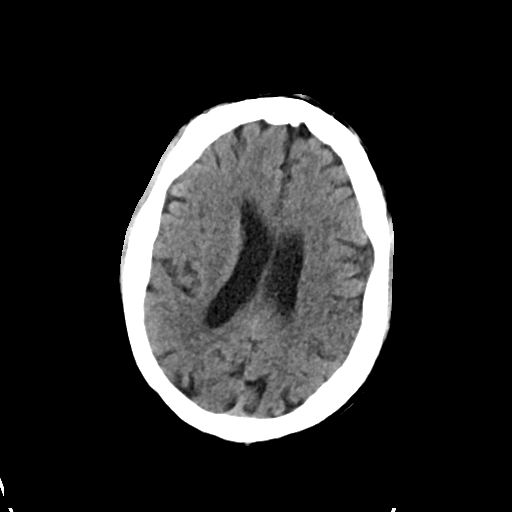
[im 21/34  bone]
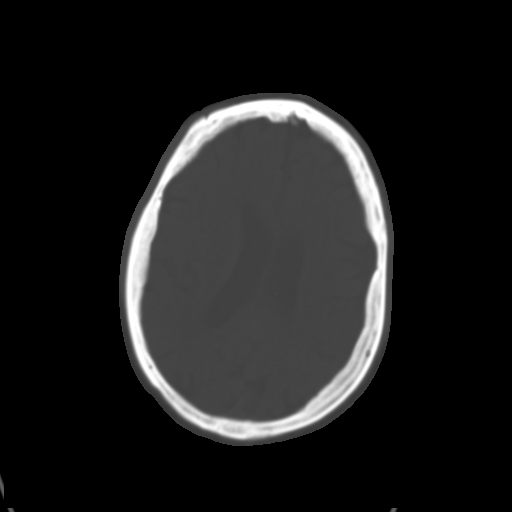
[im 25/34  brain]
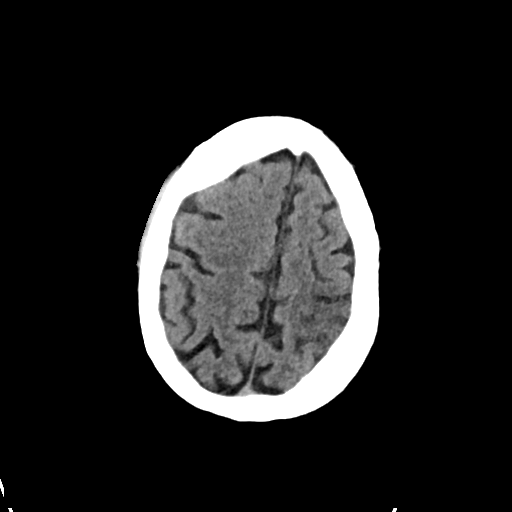
[im 29/34  brain]
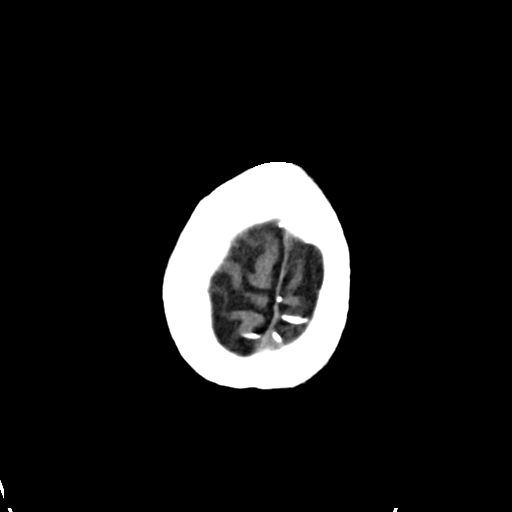

[Series 4: head bone · axial · 0.48mm/px · z∈[-78,-44]mm · 3 of 85 slices shown]
[im 9/85  bone]
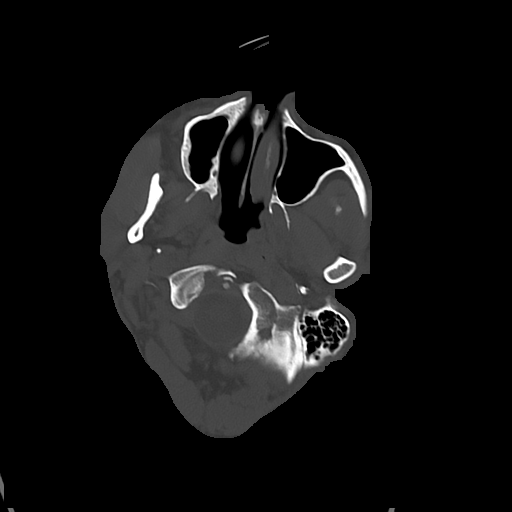
[im 17/85  bone]
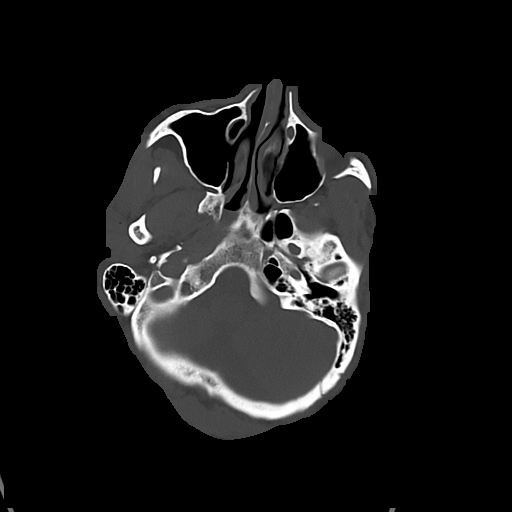
[im 26/85  bone]
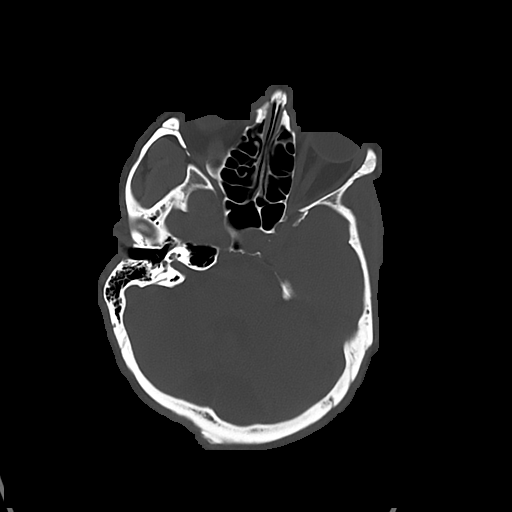

[Series 5: cor soft · coronal · 0.32mm/px · 3 of 67 slices shown]
[im 23/67  brain]
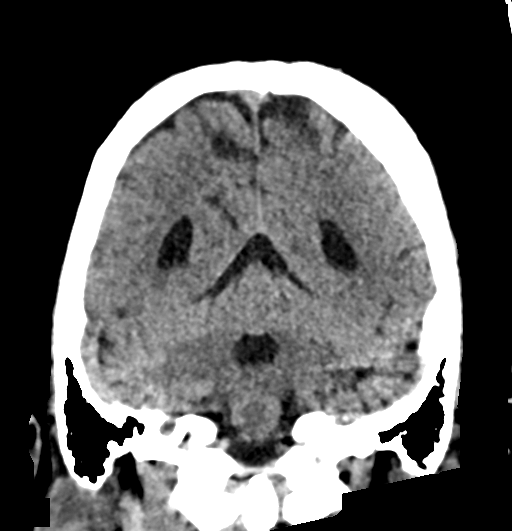
[im 30/67  brain]
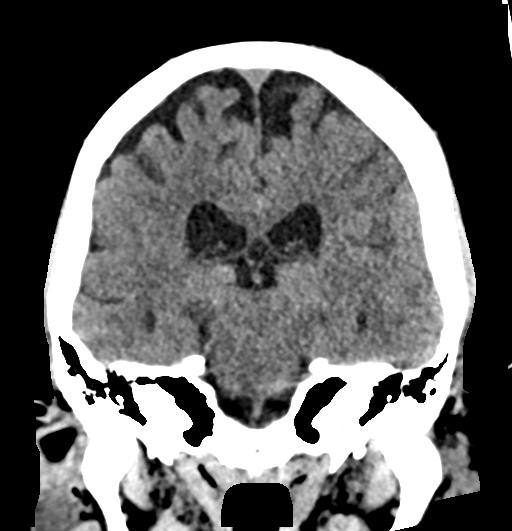
[im 37/67  brain]
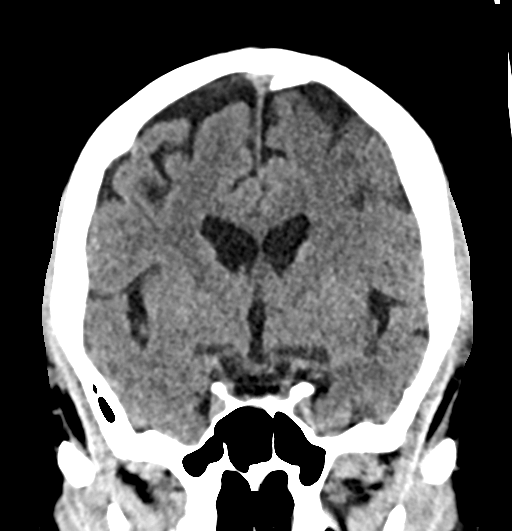

[Series 6: sag soft · sagittal · 0.33mm/px · 3 of 54 slices shown]
[im 20/54  brain]
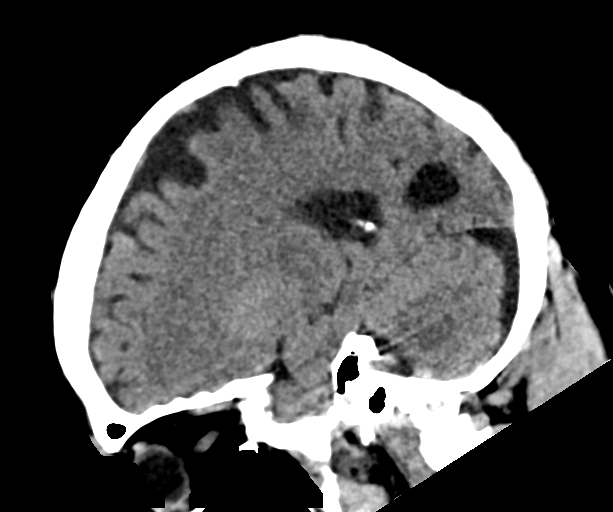
[im 27/54  brain]
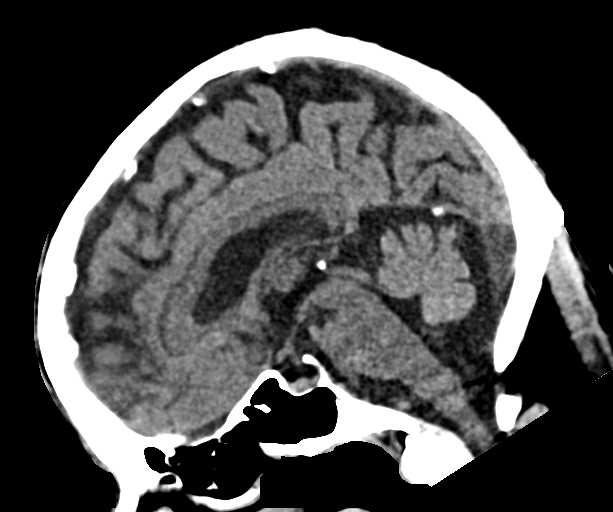
[im 35/54  brain]
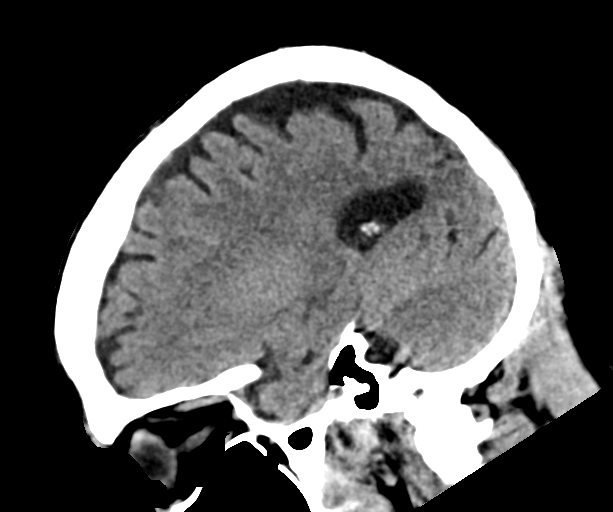

[16 of 47 positions shown; findings below may reference images not displayed]

FINDINGS: Brain: Increased conspicuity of cytotoxic edema corresponding to
abnormal DWI yesterday, most pronounced in the posterior left MCA
territory and not definitely extended since the MRI (series 3,
images 18 through 24).

No associated hemorrhage or mass effect. No ventriculomegaly. Deep
gray matter nuclei and brainstem appear a stable. Left
cerebellopontine angle vestibular schwannoma redemonstrated on
series 3, image 9.

Vascular: No suspicious intracranial vascular hyperdensity.

Skull: No acute osseous abnormality identified.

Sinuses/Orbits: Visualized paranasal sinuses and mastoids are stable
and well aerated.

Other: Disconjugate gaze. No other acute orbit or scalp soft tissue
finding.
IMPRESSION: 1. Expected CT appearance of the acute cortical infarcts on MRI
yesterday, most confluent in the posterior Left MCA territory. No
hemorrhagic transformation or mass effect.

2. Stable left cerebellopontine angle vestibular schwannoma.

## 2024-01-01 NOTE — Progress Notes (Incomplete)
 -  Advanced [facility] Clinic Note   Primary Care: Senaida Dama, NP HF Cardiologist: Dr. Mitzie Anda  Chief complaint: CHF  HPI: Craig Schmidt is a 53 y.o. 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   Admitted 02/23 with left MCA CVA s/p tenecteplase . Stroke felt to be likely secondary to cardiomyopathy. Echo EF 25-30%,k RV mildly reduced, RVSP 50 mmHg, moderate MS, mild to moderate MR, moderate to severe TR, dilated IVC. TEE 02/23: EF 25-30%, RV severely reduced, MV stenotic (? Parachute mitral valve), MVA 1.45 cm2 consistent with severe MS, moderate MR, no interatrial shunt, no LV or LAA thrombus. Cardiology consulted. CM likely d/t subtance abuse +/- uncontrolled HTN. Had been started on coumadin  but later switched to eliquis  d/t concern for compliance given his social situation.  Admitted 5/23 with acute inferolateral STEMI. Had used cocaine  24 hrs prior to presentation. Emergent LHC with 100% apical LAD suspected embolic vs plaque rupture with thrombosis. LVEDP 40. Echo this admit showed EF < 20%, ? RV okay. He was significantly volume overloaded and hypertensive. Started on lasix  gtt and nitro gtt. Had good diuresis but Scr began to trend up, 1.5>1.77>1.87>2.0.  Developed soft BP and BP-active meds held. There was concern for low-output. PICC placed and Co-ox within normal range. Drips weaned and GDMT started. He was discharged home, weight 156 lbs.  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   R/LHC (9/23) arranged to assess mitral stenosis, which showed primarily pulmonary venous hypertension, normal RA pressure and minimally elevated PCWP, and moderate mitral stenosis.   Admitted 5/24 with CHF,  2/2 running out of meds x several weeks. Echo showed EF 25-30%, RV mildly reduced, mild MR and mild MS. Diuresed with IV lasix  and GDMT titrated. Discharged home, weight 138 lbs.  Echo in 12/24 showed EF < 20%, mild-moderate LV dilation, moderate RV dysfunction/mild RV enlargement, severe LAE, moderate mitral stenosis with restricted leaflets.   Patient had a recurrent CVA in 2/25, right parietal area due to right MCA branch occlusion.   Patient returns for CHF followup.  He has significant residual effects from his prior CVAs with weakness in both arms.  He is now in an apartment with his girlfriend.  He is able to walk some in the apartment but is weak and unstable.  He is unable to manage a cane or walker due to arm weakness. He has not had any Lasix  x 2 wks (ran out).  Still smokes a few cigarettes/day.  Mild dyspnea with activities around the house.  Has orthopnea, sleeps in recliner.  No chest pain.  No lightheadedness. He has been working to stay off cocaine  but still uses occasionally.   ECG (personally reviewed): NSR, anterolateral TWIs  Labs (6/23): K 3.8, creatinine 1.75 Labs (7/23): K 4.3, creatinine 1.39 Labs (8/23): digoxin  0.2, K 4.9, creatinine 1.37 Labs (9/23): K 4.3, creatinine 1.26 Labs (11/23): K 4.1, creatinine 1.65 Labs (5/24): K 4.5, creatinine 2.02 Labs (7/24): K 4.2, creatinine 1.7 Labs (2/25): LDL 77, K 4.8, creatinine 7.25  PMH: 1. CVA: Left MCA CVA in 2/23, treated with tenecteplase .  Thought to be due to low EF/cardiomyopathy.  - Recurrent CVA 2/25: Right parietal embolic CVA (right MCA branch occlusion).  2. Left vestibular schwannoma.  3. Hyperlipidemia 4. H/o cocaine  abuse. 5. H/o ETOH abuse.  6. 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  Most recent echo in 9/23 showed moderate mitral stenosis with trivial MR.  - R/LHC (9/23) showed moderate mitral stenosis, mitral valve mean gradient 10.9 mmHg, MVA 1.74 cm^2 7. Chronic systolic CHF: Mixed  ischemic/nonischemic CMP.  Cocaine /ETOH abuse, HTN, CAD.  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 - Inferolateral MI 5/23 with LHC showing occlusion of the apical LAD.  - R/LHC (9/23): RA mean 4, PA 50/20 (mean 33), PCWP 15, CO/CI (Fick) 5.74/3.06, PVR 3.1 WU - Echo (5/24): EF 25-30%, RV mildly reduced, mild MR and mild MS. - Echo (12/24): EF < 20%, mild-moderate LV dilation, moderate RV dysfunction/mild RV enlargement, severe LAE, moderate mitral stenosis with restricted leaflets.  8. CAD: Inferolateral STEMI in 5/23 with LHC showing occluded apical LAD managed medically (?embolic vs plaque rupture).    Current Outpatient Medications  Medication Sig Dispense Refill   apixaban  (ELIQUIS ) 5 MG TABS tablet Take 1 tablet (5 mg total) by mouth 2 (two) times daily. 60 tablet 3   carvedilol  (COREG ) 3.125 MG tablet Take 1 tablet (3.125 mg total) by mouth 2 (two) times daily. 180 tablet 3   empagliflozin  (JARDIANCE ) 10 MG TABS tablet Take 1 tablet (10 mg total) by mouth daily. 30 tablet 2   furosemide  (LASIX ) 20 MG tablet Take 1 tablet (20 mg total) by mouth as needed. For weight gain of 3lb in 24 hours or 5 lb in a week     Incontinence Supplies (MALE URINAL) MISC 1 each by Does not apply route daily as needed. 1 each 2   Incontinence Supply Disposable ([employer] DISPOSABLE BED MATS) MISC 1 each by Does not apply route daily as needed. 9 each 11   Incontinence Supply Disposable (PROCARE ADULT BRIEFS LARGE) MISC 1 each by Does not apply route daily as needed. 25 each 11   nicotine  polacrilex (NICORETTE ) 2 MG gum Take 1 each (2 mg total) by mouth as needed for smoking cessation. 110 tablet 0    oxyCODONE -acetaminophen  (PERCOCET) 5-325 MG tablet Take 1 tablet by mouth every 6 (six) hours as needed. 15 tablet 0   rosuvastatin  (CRESTOR ) 10 MG tablet Take 1 tablet (10 mg total) by mouth daily. 90 tablet 3   sacubitril -valsartan  (ENTRESTO ) 49-51 MG Take 1 tablet by mouth 2 (two) times daily. 60 tablet 11   spironolactone  (ALDACTONE ) 25 MG tablet Take 0.5 tablets (12.5 mg total) by mouth daily. 15 tablet 2   triamcinolone  (KENALOG ) 0.025 % cream Apply 1 Application topically 2 (two) times daily. 454 g 2   No current facility-administered medications for this visit.   Allergies  Allergen Reactions   Shellfish Allergy Anaphylaxis   Peanut (Diagnostic) Itching   Social History   Socioeconomic History   Marital status: Significant Other    Spouse name: Not on file   Number of children: 7   Years of education: Not on file   Highest education level: 9th grade  Occupational History   Occupation: unemployed    Comment: experiencing homelessness-living at daily rent motels.  Tobacco Use   Smoking status: Every Day  Current packs/day: 1.00    Average packs/day: 1 pack/day for 41.0 years (41.0 ttl pk-yrs)    Types: Cigarettes    Passive exposure: Current   Smokeless tobacco: Never  Vaping Use   Vaping status: Never Used  Substance and Sexual Activity   Alcohol  use: Yes    Alcohol /week: 63.0 standard drinks of alcohol     Types: 63 Cans of beer per week    Comment: 3-40oz beer/day x10 years. 1-2 40oz beer/day in 20s/30s   Drug use: Yes    Types: Crack cocaine , Cocaine     Comment: last use 01/13/22   Sexual activity: Not Currently  Other Topics Concern   Not on file  Social History Narrative   Not on file   Social Drivers of Health   Financial Resource Strain: High Risk (07/19/2023)   Overall Financial Resource Strain (CARDIA)    Difficulty of Paying Living Expenses: Very hard  Food Insecurity: Food Insecurity Present (09/04/2023)   Hunger Vital Sign    Worried About  Running Out of Food in the Last Year: Sometimes true    Ran Out of Food in the Last Year: Sometimes true  Transportation Needs: Unmet Transportation Needs (09/04/2023)   PRAPARE - Transportation    Lack of Transportation (Medical): Yes    Lack of Transportation (Non-Medical): Yes  Physical Activity: Not on file  Stress: Stress Concern Present (10/16/2022)   [employer] of Occupational Health - Occupational Stress Questionnaire    Feeling of Stress : To some extent  Social Connections: Not on file  Intimate Partner Violence: Not At Risk (09/04/2023)   Humiliation, Afraid, Rape, and Kick questionnaire    Fear of Current or Ex-Partner: No    Emotionally Abused: No    Physically Abused: No    Sexually Abused: No   Family History  Problem Relation Age of Onset   Diabetes Mother    There were no vitals taken for this visit.  Wt Readings from Last 3 Encounters:  12/13/23 72.2 kg (159 lb 3.2 oz)  09/04/23 68 kg (150 lb)  09/03/23 65.9 kg (145 lb 4.5 oz)   PHYSICAL EXAM: General: NAD, chronically ill-appearing Neck: No JVD, no thyromegaly or thyroid  nodule.  Lungs: Clear to auscultation bilaterally with normal respiratory effort. CV: Nondisplaced PMI.  Heart regular S1/S2, no S3/S4, no murmur.  No peripheral edema.  No carotid bruit.  Normal pedal pulses.  Abdomen: Soft, nontender, no hepatosplenomegaly, no distention.  Skin: Intact without lesions or rashes.  Neurologic: Alert and oriented x 3. Bilateral arm weakness.  Psych: Normal affect. Extremities: No clubbing or cyanosis.  HEENT: Normal.   ASSESSMENT & PLAN: 1. Chronic systolic CHF: Patient was found to have cardiomyopathy in 2/23 at time of admission for CVA.  Echo showed EF 25-30% at that time.  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  MI in 5/23 (?cardioembolic) also contributes.  Echo in 5/23 showed LV EF < 20%, RV mildly decreased function, abnormal mitral valve  looks rheumatic => suspect moderate mitral stenosis at least with planimetered MVA 1.6 cm^2 and calculated MVA by VTI 0.8 cm^2; mean gradient only 6 mmHg but likely function of low cardiac output. Most recent echo in 12/24 showed EF < 20%, mild-moderate LV dilation, moderate RV dysfunction/mild RV enlargement, severe LAE, moderate mitral stenosis with restricted leaflets. NYHA class III but symptoms confounded by weakness/unsteadiness from prior CVAs.  He does not look particularly volume overloaded.  - He has been  out of Lasix  for about 2 wks.  I will restart him on a lower dose, 20 mg daily.  - Increase Entresto  to 49/51 bid, BMET/BNP today and BMET in 10 days.  - Continue Jardiance  10  mg daily. - Start Coreg  3.125 mg bid.  - Continue spironolactone  12.5 daily.  - If he can get off cocaine , would be ICD candidate (narrow QRS so no CRT).  - Not candidate for advanced therapies with debility and active cocaine  use.  2. CAD: Admit 6/23 with inferolateral STEMI by ECG, cath showed occluded apical LAD.  Cannot rule out plaque rupture in setting of cocaine  abuse, but given history of suspected cardioembolic CVA, wonder if this was not a cardioembolic MI. No chest pain.   - Continue Eliquis  5 mg bid.  - He can stop ASA, do not need to take both this and Eliquis .  - Restart statin, Crestor  20 mg daily with lipids/LFTs in 2 months.  3. HTN: Elevated today.   - As above, starting Coreg  and increasing Entresto .  4. CVA: 2/23 and again in 2/25, has both right and left arm weakness.  Thought to be cardioembolic, started on anticoagulation (was off Eliquis  when he had the 2nd CVA).   - Continue Eliquis . 5. Cocaine  abuse: Ongoing use. Discussed cessation. He is trying to cut back.  6. 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   Echo 9/23 shows moderate MS with trivial MR. Unusual given age. He denies known rheumatic fever though he remembers have strep throat at least twice as a  child. L/RHC (9/23) showed moderate mitral stenosis, with mitral valve mean gradient 10.9 mmHg, MVA 1.74 cm^2.  Echo in 12/24 also showed moderate mitral stenosis.  - He would not be a candidate for surgical mitral valve replacement.  7. Vestibular schwannoma: Has had vertigo and falls. No falls recently.    Follow up in 3 wks with APP.   I spent 32 minutes reviewing records, interviewing/examining patient, and managing orders.   Arlice Bene [facility]  01/01/2024

## 2024-01-02 ENCOUNTER — Telehealth (HOSPITAL_COMMUNITY): Payer: Self-pay

## 2024-01-02 NOTE — Telephone Encounter (Signed)
 Called to confirm/remind patient of their appointment at the [facility] on 01/03/24.   Appointment:   [] Confirmed  [x] Left mess   [] No answer/No voice mail  [] VM Full/unable to leave message  [] Phone not in service  And to bring in all medications and/or complete list.

## 2024-01-03 ENCOUNTER — Telehealth: Payer: Self-pay

## 2024-01-03 ENCOUNTER — Encounter (HOSPITAL_COMMUNITY): Payer: MEDICAID

## 2024-01-03 NOTE — Telephone Encounter (Signed)
 Copied from CRM [id number]. Topic: Clinical - Prescription Issue >> Jan 03, 2024 10:39 AM Stanly Early wrote: Reason for CRM: Patient is calling for some incontinence supply's, it stated in the CRM [phone] the order was faxed to [facility]. Patient has been waiting for weeks with no update about when the order will be delivered. Please give a callback [phone]

## 2024-01-06 ENCOUNTER — Ambulatory Visit: Payer: MEDICAID | Admitting: Neurology

## 2024-01-06 ENCOUNTER — Encounter: Payer: Self-pay | Admitting: Neurology

## 2024-01-06 NOTE — Telephone Encounter (Unsigned)
 Copied from CRM [id number]. Topic: Clinical - Prescription Issue >> Jan 06, 2024  9:49 AM Hassie Lint wrote: Reason for CRM:  Patient is calling to check on the status of the request for incontinence supply's (see CRM [phone]) the order was faxed to [facility]. Patient has been waiting for weeks with no update and states called [facility] and they stated they had not received anything.   [facility] Fax: [phone]  Patient is requesting a call once fax has been sent to confirm: [phone]

## 2024-01-06 NOTE — Telephone Encounter (Signed)
Called patient and left voicemail,

## 2024-01-06 NOTE — Telephone Encounter (Signed)
 Paperwork faxed and is scanned in chart

## 2024-01-06 NOTE — Telephone Encounter (Signed)
 Incontinence supplies ordered during telephone visit on 12/18/2023. Craig Schmidt, if you will please contact [facility] for an update. Thank you.

## 2024-01-07 ENCOUNTER — Ambulatory Visit: Payer: MEDICAID | Attending: Cardiovascular Disease | Admitting: Cardiovascular Disease

## 2024-01-08 ENCOUNTER — Encounter: Payer: Self-pay | Admitting: Cardiovascular Disease

## 2024-01-08 NOTE — Telephone Encounter (Signed)
 Craig Schmidt and patient calling for update. Advised paperwork faxed to [facility] and scanned in chart. Craig Schmidt and patient requested phone number to [facility]. Provided phone number, Craig Schmidt will reach out to them to confirm paperwork received.

## 2024-01-15 NOTE — Progress Notes (Incomplete)
 -  ADVANCED HF CLINIC NOTE  Primary Care: Lorren Greig PARAS, NP HF Cardiologist: Dr. Rolan Reason for Visit: Heart Failure Follow-up  HPI: Craig Schmidt is a 53 y.o. 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   Admitted 2/23 with L MCA CVA s/p tenecteplase . Stroke felt to be likely secondary to cardiomyopathy. Echo EF 25-30%,k RV mildly reduced, RVSP 50 mmHg, moderate MS, mild to moderate MR, moderate to severe TR, dilated IVC. TEE 2/23: EF 25-30%, RV severely reduced, MV stenotic (? Parachute mitral valve), MVA 1.45 cm2 consistent with severe MS, moderate MR, no interatrial shunt, no LV or LAA thrombus. CM likely d/t subtance abuse +/- uncontrolled HTN. Had been started on coumadin  but later switched to eliquis  d/t concern for compliance given his social situation.  Admitted 5/23 with acute inferolateral STEMI. Had used cocaine  24 hrs prior to presentation. Emergent LHC with 100% apical LAD suspected embolic vs plaque rupture with thrombosis. LVEDP 40. Echo this admit showed EF < 20%, ? RV okay. He was significantly volume overloaded and hypertensive. Started on lasix  gtt and nitro gtt. Had good diuresis but Scr began to trend up, 1.5>1.77>1.87>2.0.  Developed soft BP and BP-active meds held. There was concern for low-output. PICC placed and Co-ox within normal range. Drips weaned and GDMT started. He was discharged home, weight 156 lbs.  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   R/LHC (9/23) arranged to assess mitral stenosis, which showed primarily pulmonary venous hypertension, normal RA pressure and minimally elevated PCWP, and moderate mitral stenosis.   Admitted 5/24 with CHF, 2/2 running out of  meds x several weeks. Echo showed EF 25-30%, RV mildly reduced, mild MR and mild MS. Diuresed with IV lasix  and GDMT titrated. Discharged home, weight 138 lbs.  Echo in 12/24 showed EF < 20%, mild-moderate LV dilation, moderate RV dysfunction/mild RV enlargement, severe LAE, moderate mitral stenosis with restricted leaflets.   Patient had a recurrent CVA in 2/25, right parietal area due to right MCA branch occlusion.   12/13/23 returns for CHF followup.  He has significant residual effects from his prior CVAs with weakness in both arms.  He is now in an apartment with his girlfriend.  He is able to walk some in the apartment but is weak and unstable.  He is unable to manage a cane or walker due to arm weakness. He has not had any Lasix  x 2 wks (ran out).  Still smokes a few cigarettes/day.  Mild dyspnea with activities around the house.  Has orthopnea, sleeps in recliner.  No chest pain.  No lightheadedness. He has been working to stay off cocaine  but still uses occasionally.    He returns today for heart failure follow up. Overall feeling ***. NYHA ***. Reports {Symptoms; cardiac:12860::dyspnea,fatigue}. Denies {Symptoms; cardiac:12860::chest pain,dyspnea,fatigue,near-syncope,orthopnea,palpitations,dizziness,abnormal bleeding}. Able to perform ADLs. Appetite okay. Weight at home ***. BP at home***. Compliant with all medications.   PMH: 1. CVA: Left MCA CVA in 2/23, treated with tenecteplase .  Thought to be due to low EF/cardiomyopathy.  - Recurrent CVA 2/25: Right parietal embolic CVA (right MCA branch occlusion).  2. Left vestibular schwannoma.  3. Hyperlipidemia 4. H/o cocaine  abuse. 5. H/o ETOH abuse.  6. 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  Most  recent echo in 9/23 showed moderate mitral stenosis with trivial MR.  - R/LHC (9/23) showed moderate mitral stenosis, mitral valve mean gradient 10.9 mmHg, MVA 1.74 cm^2 7. Chronic systolic CHF: Mixed  ischemic/nonischemic CMP.  Cocaine /ETOH abuse, HTN, CAD.  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 - Inferolateral MI 5/23 with LHC showing occlusion of the apical LAD.  - R/LHC (9/23): RA mean 4, PA 50/20 (mean 33), PCWP 15, CO/CI (Fick) 5.74/3.06, PVR 3.1 WU - Echo (5/24): EF 25-30%, RV mildly reduced, mild MR and mild MS. - Echo (12/24): EF < 20%, mild-moderate LV dilation, moderate RV dysfunction/mild RV enlargement, severe LAE, moderate mitral stenosis with restricted leaflets.  8. CAD: Inferolateral STEMI in 5/23 with LHC showing occluded apical LAD managed medically (?embolic vs plaque rupture).    Current Outpatient Medications  Medication Sig Dispense Refill   apixaban  (ELIQUIS ) 5 MG TABS tablet Take 1 tablet (5 mg total) by mouth 2 (two) times daily. 60 tablet 3   carvedilol  (COREG ) 3.125 MG tablet Take 1 tablet (3.125 mg total) by mouth 2 (two) times daily. 180 tablet 3   empagliflozin  (JARDIANCE ) 10 MG TABS tablet Take 1 tablet (10 mg total) by mouth daily. 30 tablet 2   furosemide  (LASIX ) 20 MG tablet Take 1 tablet (20 mg total) by mouth as needed. For weight gain of 3lb in 24 hours or 5 lb in a week     Incontinence Supplies (MALE URINAL) MISC 1 each by Does not apply route daily as needed. 1 each 2   Incontinence Supply Disposable ([employer] DISPOSABLE BED MATS) MISC 1 each by Does not apply route daily as needed. 9 each 11   Incontinence Supply Disposable (PROCARE ADULT BRIEFS LARGE) MISC 1 each by Does not apply route daily as needed. 25 each 11   nicotine  polacrilex (NICORETTE ) 2 MG gum Take 1 each (2 mg total) by mouth as needed for smoking cessation. 110 tablet 0    oxyCODONE -acetaminophen  (PERCOCET) 5-325 MG tablet Take 1 tablet by mouth every 6 (six) hours as needed. 15 tablet 0   rosuvastatin  (CRESTOR ) 10 MG tablet Take 1 tablet (10 mg total) by mouth daily. 90 tablet 3   sacubitril -valsartan  (ENTRESTO ) 49-51 MG Take 1 tablet by mouth 2 (two) times daily. 60 tablet 11   spironolactone  (ALDACTONE ) 25 MG tablet Take 0.5 tablets (12.5 mg total) by mouth daily. 15 tablet 2   triamcinolone  (KENALOG ) 0.025 % cream Apply 1 Application topically 2 (two) times daily. 454 g 2   No current facility-administered medications for this visit.   Allergies  Allergen Reactions   Shellfish Allergy Anaphylaxis   Peanut (Diagnostic) Itching   Social History   Socioeconomic History   Marital status: Significant Other    Spouse name: Not on file   Number of children: 7   Years of education: Not on file   Highest education level: 9th grade  Occupational History   Occupation: unemployed    Comment: experiencing homelessness-living at daily rent motels.  Tobacco Use   Smoking status: Every Day    Current packs/day: 1.00    Average packs/day: 1 pack/day for 41.0 years (41.0 ttl pk-yrs)  Types: Cigarettes    Passive exposure: Current   Smokeless tobacco: Never  Vaping Use   Vaping status: Never Used  Substance and Sexual Activity   Alcohol  use: Yes    Alcohol /week: 63.0 standard drinks of alcohol     Types: 63 Cans of beer per week    Comment: 3-40oz beer/day x10 years. 1-2 40oz beer/day in 20s/30s   Drug use: Yes    Types: Crack cocaine , Cocaine     Comment: last use 01/13/22   Sexual activity: Not Currently  Other Topics Concern   Not on file  Social History Narrative   Not on file   Social Drivers of Health   Financial Resource Strain: High Risk (07/19/2023)   Overall Financial Resource Strain (CARDIA)    Difficulty of Paying Living Expenses: Very hard  Food Insecurity: Food Insecurity Present (09/04/2023)   Hunger Vital Sign    Worried About  Running Out of Food in the Last Year: Sometimes true    Ran Out of Food in the Last Year: Sometimes true  Transportation Needs: Unmet Transportation Needs (09/04/2023)   PRAPARE - Transportation    Lack of Transportation (Medical): Yes    Lack of Transportation (Non-Medical): Yes  Physical Activity: Not on file  Stress: Stress Concern Present (10/16/2022)   [employer] of Occupational Health - Occupational Stress Questionnaire    Feeling of Stress : To some extent  Social Connections: Not on file  Intimate Partner Violence: Not At Risk (09/04/2023)   Humiliation, Afraid, Rape, and Kick questionnaire    Fear of Current or Ex-Partner: No    Emotionally Abused: No    Physically Abused: No    Sexually Abused: No   Family History  Problem Relation Age of Onset   Diabetes Mother    There were no vitals taken for this visit.  Wt Readings from Last 3 Encounters:  12/13/23 72.2 kg (159 lb 3.2 oz)  09/04/23 68 kg (150 lb)  09/03/23 65.9 kg (145 lb 4.5 oz)   PHYSICAL EXAM: General: Well appearing. No distress on RA Cardiac: JVP ***. S1 and S2 present. No murmurs or rub. Resp: Lung sounds clear and equal B/L Abdomen: Soft, non-tender, non-distended.  Extremities: Warm and dry.  *** edema.  Neuro: Alert and oriented x3. Affect pleasant. Moves all extremities without difficulty.  ASSESSMENT & PLAN: 1. Chronic systolic CHF: Diagnosed in 2/23 at time of admission for CVA.  Echo showed EF 25-30% at that time.  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  MI in 5/23 (?cardioembolic) also contributes.  Echo in 5/23 showed LV EF < 20%, RV mildly decreased function, abnormal mitral valve looks rheumatic => suspect moderate mitral stenosis at least with planimetered MVA 1.6 cm^2 and calculated MVA by VTI 0.8 cm^2; mean gradient only 6 mmHg but likely function of low cardiac output. Most recent echo in 12/24 showed EF < 20%, mild-moderate LV  dilation, moderate RV dysfunction/mild RV enlargement, severe LAE, moderate mitral stenosis with restricted leaflets.  - NYHA class III but symptoms confounded by weakness/unsteadiness from prior CVAs.  He does not look particularly volume overloaded.  - Continue Lasix  20 mg daily - Increase Entresto  to 49/51 bid - Continue Jardiance  10  mg daily. - Start Coreg  3.125 mg bid.  - Continue spironolactone  12.5 daily.  - If he can get off cocaine , would be ICD candidate (narrow QRS so no CRT).  - Not candidate for advanced therapies with debility and active  cocaine  use.  2. CAD: Admit 6/23 with inferolateral STEMI by ECG, cath showed occluded apical LAD.  Cannot rule out plaque rupture in setting of cocaine  abuse, but given history of suspected cardioembolic CVA, wonder if this was not a cardioembolic MI. No chest pain.   - Continue Eliquis  5 mg bid.  - No ASA with Eliquis  - Continue crestor  20 mg daily with lipids/LFTs in 2 months.  3. HTN: Elevated today.   - As above, starting Coreg  and increasing Entresto .  4. CVA: 2/23 and again in 2/25, has both right and left arm weakness.  Thought to be cardioembolic, started on anticoagulation (was off Eliquis  when he had the 2nd CVA).   - Continue Eliquis . 5. Cocaine  abuse: Ongoing use. Discussed cessation. He is trying to cut back.  6. 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   Echo 9/23 shows moderate MS with trivial MR. Unusual given age. He denies known rheumatic fever though he remembers have strep throat at least twice as a child. L/RHC (9/23) showed moderate mitral stenosis, with mitral valve mean gradient 10.9 mmHg, MVA 1.74 cm^2.  Echo in 12/24 also showed moderate mitral stenosis.  - He would not be a candidate for surgical mitral valve replacement.  7. Vestibular schwannoma: Has had vertigo and falls. No falls recently.    Follow up in 3 wks with APP Follow up in *** with ***  [place] Julianna Vanwagner, NP 01/15/2024

## 2024-01-16 ENCOUNTER — Other Ambulatory Visit (HOSPITAL_COMMUNITY): Payer: Self-pay

## 2024-01-16 ENCOUNTER — Inpatient Hospital Stay (HOSPITAL_COMMUNITY)
Admission: RE | Admit: 2024-01-16 | Discharge: 2024-01-16 | Disposition: A | Discharge status: Home / Self Care | Payer: MEDICAID | Source: Ambulatory Visit

## 2024-01-16 DIAGNOSIS — I5022 Chronic systolic (congestive) heart failure: Secondary | ICD-10-CM

## 2024-01-16 MED ORDER — EMPAGLIFLOZIN 10 MG PO TABS: 10.0000 mg | ORAL_TABLET | Freq: Every day | ORAL | 5 refills | Status: DC

## 2024-01-28 NOTE — Telephone Encounter (Signed)
 I called patient wife and informed her that I have resent order for shower chair to [facility]. I have also informed her that [employer] will contact us  when it is time to resend order for diapers

## 2024-02-03 ENCOUNTER — Other Ambulatory Visit: Payer: Self-pay | Admitting: Family

## 2024-02-03 ENCOUNTER — Telehealth: Payer: Self-pay | Admitting: Family

## 2024-02-03 ENCOUNTER — Encounter: Payer: Self-pay | Admitting: Family

## 2024-02-03 ENCOUNTER — Ambulatory Visit (INDEPENDENT_AMBULATORY_CARE_PROVIDER_SITE_OTHER): Payer: MEDICAID | Admitting: Family

## 2024-02-03 VITALS — BP 131/93 | HR 85 | Temp 97.7°F | Resp 16 | Ht 67.0 in | Wt 159.0 lb

## 2024-02-03 DIAGNOSIS — I050 Rheumatic mitral stenosis: Secondary | ICD-10-CM

## 2024-02-03 DIAGNOSIS — J3089 Other allergic rhinitis: Secondary | ICD-10-CM

## 2024-02-03 DIAGNOSIS — M79674 Pain in right toe(s): Secondary | ICD-10-CM

## 2024-02-03 DIAGNOSIS — M79672 Pain in left foot: Secondary | ICD-10-CM

## 2024-02-03 DIAGNOSIS — K649 Unspecified hemorrhoids: Secondary | ICD-10-CM

## 2024-02-03 DIAGNOSIS — M79671 Pain in right foot: Secondary | ICD-10-CM | POA: Diagnosis not present

## 2024-02-03 DIAGNOSIS — Z8673 Personal history of transient ischemic attack (TIA), and cerebral infarction without residual deficits: Secondary | ICD-10-CM

## 2024-02-03 DIAGNOSIS — Z1211 Encounter for screening for malignant neoplasm of colon: Secondary | ICD-10-CM

## 2024-02-03 DIAGNOSIS — I10 Essential (primary) hypertension: Secondary | ICD-10-CM

## 2024-02-03 DIAGNOSIS — Z0289 Encounter for other administrative examinations: Secondary | ICD-10-CM

## 2024-02-03 DIAGNOSIS — K5900 Constipation, unspecified: Secondary | ICD-10-CM

## 2024-02-03 DIAGNOSIS — M25519 Pain in unspecified shoulder: Secondary | ICD-10-CM

## 2024-02-03 DIAGNOSIS — I5082 Biventricular heart failure: Secondary | ICD-10-CM

## 2024-02-03 DIAGNOSIS — Z8679 Personal history of other diseases of the circulatory system: Secondary | ICD-10-CM

## 2024-02-03 MED ORDER — POLYETHYLENE GLYCOL 3350 17 G PO PACK: 17.0000 g | PACK | Freq: Every day | ORAL | 0 refills | Status: AC | PRN

## 2024-02-03 MED ORDER — BLOOD PRESSURE MONITOR DEVI: 0 refills | Status: DC

## 2024-02-03 MED ORDER — CETIRIZINE HCL 5 MG PO TABS
5.0000 mg | ORAL_TABLET | Freq: Every day | ORAL | 0 refills | Status: DC
Start: 2024-02-03 — End: 2024-06-12

## 2024-02-03 MED ORDER — DOCUSATE SODIUM 100 MG PO CAPS: 100.0000 mg | ORAL_CAPSULE | Freq: Every day | ORAL | 0 refills | Status: AC | PRN

## 2024-02-03 NOTE — Telephone Encounter (Signed)
 Patient would like meds sent in for male enhancement medication stated him and provider has spoken about it before.

## 2024-02-03 NOTE — Progress Notes (Signed)
 A home health form filled out, patient wants some allergy medication

## 2024-02-03 NOTE — Telephone Encounter (Signed)
 Noted

## 2024-02-03 NOTE — Telephone Encounter (Signed)
 Trial Miralax  as alternative. Patient may consider calling his health insurance or [facility] for resources on blood pressure monitor.

## 2024-02-03 NOTE — Progress Notes (Signed)
 Patient ID: Craig Schmidt, male    DOB: [birth year]  MRN: [id number]  CC: Paperwork  Subjective: Craig Schmidt is a 53 y.o. male who presents for paperwork. He is accompanied by his significant other.  His concerns today include:  - Needs DHB-3051 form updated.  - Established with Cardiology for chronic heart-related conditions. Requests blood pressure monitor and to be enrolled in EMT program where they come to patient's home for blood pressure check.  - Allergies. Needs medication to help.  - Right toe pain. Denies recent trauma/injury and red flag symptoms.  - States constipation and internal hemorrhoid. Due for colon cancer screening. Denies red flag symptoms.  Patient Active Problem List   Diagnosis Date Noted   Hypertension, malignant 09/04/2023   HFrEF (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) 09/04/2023   Coronary artery disease 06/27/2023   History of CVA (cerebrovascular accident) 06/27/2023   Substance use disorder 06/27/2023   Atypical chest pain 06/21/2023   Pulmonary infiltrate 06/21/2023   Multifocal pneumonia 06/11/2023   Hyperkalemia 06/11/2023   Hyponatremia 06/11/2023   Normocytic anemia 06/11/2023   Thrombocytosis 06/11/2023   Troponin I above reference range 05/25/2023   CAP (community acquired pneumonia) 05/25/2023   Pneumonia 05/25/2023   Chronic combined systolic and diastolic CHF (congestive heart failure) (HCC) 12/03/2022   Malnutrition of moderate degree 11/30/2022   Acute on chronic combined systolic and diastolic CHF (congestive heart failure) (HCC) 11/29/2022   Acute on chronic combined systolic (congestive) and diastolic (congestive) heart failure (HCC) 11/28/2022   ACS (acute coronary syndrome) (HCC) 11/28/2022   Back pain 11/28/2022   Essential hypertension 11/28/2022   Hyperlipidemia 11/28/2022   Noncompliance with medication regimen 11/28/2022   ST elevation myocardial infarction (STEMI) of inferolateral wall, subsequent  episode of care (HCC) 12/19/2021   ST elevation myocardial infarction (STEMI) (HCC)    Ischemic cardiomyopathy    Cocaine  abuse (HCC)    Smoker    Alcohol  abuse    Acute CVA (cerebrovascular accident) (HCC) 09/08/2021   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   Tear of MCL (medial collateral ligament) of knee 05/20/2015   Protein-calorie malnutrition, severe 05/17/2015   Lactic acidosis 05/15/2015   Left knee pain 05/15/2015   Lung nodules 05/15/2015   Hypoglycemia 05/14/2015   Hypothermia 05/14/2015   Acute encephalopathy 05/14/2015   Cocaine  abuse with intoxication (HCC) 05/14/2015   Alcohol  intoxication in active alcoholic (HCC) 05/14/2015   Sepsis (HCC) 05/14/2015   Homeless 05/14/2015     Current Outpatient Medications on File Prior to Visit  Medication Sig Dispense Refill   apixaban  (ELIQUIS ) 5 MG TABS tablet Take 1 tablet (5 mg total) by mouth 2 (two) times daily. 60 tablet 3   carvedilol  (COREG ) 3.125 MG tablet Take 1 tablet (3.125 mg total) by mouth 2 (two) times daily. 180 tablet 3   empagliflozin  (JARDIANCE ) 10 MG TABS tablet Take 1 tablet (10 mg total) by mouth daily. 30 tablet 5   furosemide  (LASIX ) 20 MG tablet Take 1 tablet (20 mg total) by mouth as needed. For weight gain of 3lb in 24 hours or 5 lb in a week     Incontinence Supplies (MALE URINAL) MISC 1 each by Does not apply route daily as needed. 1 each 2   Incontinence Supply Disposable ([employer] DISPOSABLE BED MATS) MISC 1 each by Does not apply route daily as needed. 9 each 11   Incontinence Supply Disposable (PROCARE ADULT BRIEFS LARGE) MISC 1 each by Does not apply route  daily as needed. 25 each 11   nicotine  polacrilex (NICORETTE ) 2 MG gum Take 1 each (2 mg total) by mouth as needed for smoking cessation. 110 tablet 0   oxyCODONE -acetaminophen  (PERCOCET) 5-325 MG tablet Take 1 tablet by mouth every 6 (six) hours as needed. 15 tablet 0   rosuvastatin  (CRESTOR ) 10 MG tablet Take 1 tablet (10 mg total) by  mouth daily. 90 tablet 3   sacubitril -valsartan  (ENTRESTO ) 49-51 MG Take 1 tablet by mouth 2 (two) times daily. 60 tablet 11   spironolactone  (ALDACTONE ) 25 MG tablet Take 0.5 tablets (12.5 mg total) by mouth daily. 15 tablet 2   triamcinolone  (KENALOG ) 0.025 % cream Apply 1 Application topically 2 (two) times daily. 454 g 2   No current facility-administered medications on file prior to visit.    Allergies  Allergen Reactions   Shellfish Allergy Anaphylaxis   Peanut (Diagnostic) Itching    Social History   Socioeconomic History   Marital status: Significant Other    Spouse name: Not on file   Number of children: 7   Years of education: Not on file   Highest education level: 9th grade  Occupational History   Occupation: unemployed    Comment: experiencing homelessness-living at daily rent motels.  Tobacco Use   Smoking status: Every Day    Current packs/day: 1.00    Average packs/day: 1 pack/day for 41.0 years (41.0 ttl pk-yrs)    Types: Cigarettes    Passive exposure: Current   Smokeless tobacco: Never  Vaping Use   Vaping status: Never Used  Substance and Sexual Activity   Alcohol  use: Yes    Alcohol /week: 63.0 standard drinks of alcohol     Types: 63 Cans of beer per week    Comment: 3-40oz beer/day x10 years. 1-2 40oz beer/day in 20s/30s   Drug use: Yes    Types: Crack cocaine , Cocaine     Comment: last use 01/13/22   Sexual activity: Not Currently  Other Topics Concern   Not on file  Social History Narrative   Not on file   Social Drivers of Health   Financial Resource Strain: Medium Risk (02/03/2024)   Overall Financial Resource Strain (CARDIA)    Difficulty of Paying Living Expenses: Somewhat hard  Food Insecurity: Food Insecurity Present (09/04/2023)   Hunger Vital Sign    Worried About Running Out of Food in the Last Year: Sometimes true    Ran Out of Food in the Last Year: Sometimes true  Transportation Needs: Unmet Transportation Needs (09/04/2023)    PRAPARE - Transportation    Lack of Transportation (Medical): Yes    Lack of Transportation (Non-Medical): Yes  Physical Activity: Not on file  Stress: No Stress Concern Present (02/03/2024)   [employer] of Occupational Health - Occupational Stress Questionnaire    Feeling of Stress: Not at all  Social Connections: Moderately Integrated (02/03/2024)   Social Connection and Isolation Panel    Frequency of Communication with Friends and Family: More than three times a week    Frequency of Social Gatherings with Friends and Family: More than three times a week    Attends Religious Services: More than 4 times per year    Active Member of [employer] or Organizations: No    Attends Banker Meetings: Never    Marital Status: Living with partner  Intimate Partner Violence: Not At Risk (09/04/2023)   Humiliation, Afraid, Rape, and Kick questionnaire    Fear of Current or Ex-Partner: No    Emotionally  Abused: No    Physically Abused: No    Sexually Abused: No    Family History  Problem Relation Age of Onset   Diabetes Mother     Past Surgical History:  Procedure Laterality Date   BRONCHIAL NEEDLE ASPIRATION BIOPSY  07/03/2023   Procedure: BRONCHIAL NEEDLE ASPIRATION BIOPSIES;  Surgeon: Claudene Toribio BROCKS, MD;  Location: [facility] ENDOSCOPY;  Service: Pulmonary;;   BRONCHIAL WASHINGS  07/03/2023   Procedure: BRONCHIAL WASHINGS;  Surgeon: Claudene Toribio BROCKS, MD;  Location: [facility] ENDOSCOPY;  Service: Pulmonary;;   BUBBLE STUDY  09/11/2021   Procedure: BUBBLE STUDY;  Surgeon: Okey Vina GAILS, MD;  Location: [facility] ENDOSCOPY;  Service: Cardiovascular;;   LEFT HEART CATH AND CORONARY ANGIOGRAPHY N/A 12/19/2021   Procedure: LEFT HEART CATH AND CORONARY ANGIOGRAPHY;  Surgeon: Claudene Victory ORN, MD;  Location: [facility] INVASIVE CV LAB;  Service: Cardiovascular;  Laterality: N/A;   RIGHT/LEFT HEART CATH AND CORONARY ANGIOGRAPHY N/A 04/13/2022   Procedure: RIGHT/LEFT HEART CATH AND CORONARY ANGIOGRAPHY;  Surgeon:  Rolan Ezra RAMAN, MD;  Location: [facility] INVASIVE CV LAB;  Service: Cardiovascular;  Laterality: N/A;   TEE WITHOUT CARDIOVERSION N/A 09/11/2021   Procedure: TRANSESOPHAGEAL ECHOCARDIOGRAM (TEE);  Surgeon: Okey Vina GAILS, MD;  Location: [facility] ENDOSCOPY;  Service: Cardiovascular;  Laterality: N/A;   VIDEO BRONCHOSCOPY WITH ENDOBRONCHIAL ULTRASOUND N/A 07/03/2023   Procedure: VIDEO BRONCHOSCOPY WITH ENDOBRONCHIAL ULTRASOUND;  Surgeon: Claudene Toribio BROCKS, MD;  Location: [facility] ENDOSCOPY;  Service: Pulmonary;  Laterality: N/A;    ROS: Review of Systems Negative except as stated above  PHYSICAL EXAM: BP (!) 131/93   Pulse 85   Temp 97.7 F (36.5 C)   Resp 16   Ht 5' 7 (1.702 m)   Wt 159 lb (72.1 kg)   SpO2 92%   BMI 24.90 kg/m   Physical Exam HENT:     Head: Normocephalic and atraumatic.     Nose: Nose normal.     Mouth/Throat:     Mouth: Mucous membranes are moist.     Pharynx: Oropharynx is clear.  Eyes:     Extraocular Movements: Extraocular movements intact.     Conjunctiva/sclera: Conjunctivae normal.     Pupils: Pupils are equal, round, and reactive to light.  Cardiovascular:     Rate and Rhythm: Normal rate and regular rhythm.     Pulses: Normal pulses.     Heart sounds: Normal heart sounds.  Pulmonary:     Effort: Pulmonary effort is normal.     Breath sounds: Normal breath sounds.  Musculoskeletal:        General: Normal range of motion.     Cervical back: Normal range of motion and neck supple.  Neurological:     General: No focal deficit present.     Mental Status: He is alert and oriented to person, place, and time.  Psychiatric:        Mood and Affect: Mood normal.        Behavior: Behavior normal.    ASSESSMENT AND PLAN: 1. Encounter for completion of form with patient (Primary) 2. Biventricular failure (HCC) 3. Moderate mitral valve stenosis 4. History of CAD (coronary artery disease) 5. History of stroke 6. Arthralgia of shoulder, unspecified laterality 7. Pain  in both feet - DHB-3051 form completed today in office.   8. Primary hypertension - Continue present management.  - Blood pressure monitor prescribed.  - Message sent to RN case manager to see if patient is eligible for home blood pressure check by EMT.  -  Keep all scheduled appointments with established Cardiology. - Blood Pressure Monitor DEVI; Use as directed to check home blood pressure 2-3 times a week  Dispense: 1 each; Refill: 0  9. Perennial allergic rhinitis - Cetirizine  as prescribed. Counseled on medication adherence/adverse effects.  - Follow-up with primary provider as scheduled. - cetirizine  (ZYRTEC ) 5 MG tablet; Take 1 tablet (5 mg total) by mouth daily.  Dispense: 90 tablet; Refill: 0  10. Pain of toe of right foot - Referral to Podiatry for evaluation/management. - Ambulatory referral to Podiatry  11. Colon cancer screening 12. Constipation, unspecified constipation type 13. Hemorrhoids, unspecified hemorrhoid type - Docusate Sodium  as prescribed. Counseled on medication adherence/adverse effects.  - Referral to Gastroenterology for evaluation/management. - docusate sodium  (COLACE) 100 MG capsule; Take 1 capsule (100 mg total) by mouth daily as needed for mild constipation.  Dispense: 90 capsule; Refill: 0 - Ambulatory referral to Gastroenterology   Patient was given the opportunity to ask questions.  Patient verbalized understanding of the plan and was able to repeat key elements of the plan. Patient was given clear instructions to go to Emergency Department or return to medical center if symptoms don't improve, worsen, or new problems develop.The patient verbalized understanding.   Orders Placed This Encounter  Procedures   Ambulatory referral to Gastroenterology   Ambulatory referral to Podiatry     Requested Prescriptions   Signed Prescriptions Disp Refills   docusate sodium  (COLACE) 100 MG capsule 90 capsule 0    Sig: Take 1 capsule (100 mg total) by  mouth daily as needed for mild constipation.   Blood Pressure Monitor DEVI 1 each 0    Sig: Use as directed to check home blood pressure 2-3 times a week   cetirizine  (ZYRTEC ) 5 MG tablet 90 tablet 0    Sig: Take 1 tablet (5 mg total) by mouth daily.    Follow-up with primary provider as scheduled.  Greig JINNY Drones, NP

## 2024-02-03 NOTE — Telephone Encounter (Signed)
 Copied from CRM [id number]. Topic: Clinical - Medication Question >> Feb 03, 2024  4:18 PM Jasmin G wrote: Reason for CRM: Pt. Was in the clinic recently and was prescribed 2 new medications: A stool softener and a blood pressure cuff. Patient states that those 2 are not covered by his insurance and would like a call back to get suggestions. Please call back ASAP.

## 2024-02-03 NOTE — Telephone Encounter (Signed)
 Patient has chronic heart conditions. Erectile dysfunction medication may cause adverse effects to his heart. Patient should consult with his established Cardiology for advisement.

## 2024-02-05 NOTE — Telephone Encounter (Signed)
 I tried to reach the patient to inform him that the paramedicine program is not able to accept him back into their program and I had to leave a message requesting a call back.   I discussed this new referral with the paramedic team and they said that the patient was discharged from their program 10/02/2022 due to non-compliance and illegal drug use presenting a safety concern.

## 2024-02-05 NOTE — Telephone Encounter (Signed)
 I called and made him aware of pcp recommendations

## 2024-02-05 NOTE — Telephone Encounter (Signed)
 Copied from CRM [id number]. Topic: Clinical - Prescription Issue >> Feb 04, 2024  2:05 PM Yolanda T wrote: Reason for CRM: patients spouse called to see if they could get a hand written script for a blood pressure cuff or if provider could send the script to a DME that takes patient insurance. She also wanted to see if patient could get a medication for erectile dysfunction. Please f/u with patient

## 2024-02-06 NOTE — Telephone Encounter (Signed)
 I called patient and made him aware of pcp recommendations

## 2024-02-06 NOTE — Telephone Encounter (Signed)
 I called patient and made him aware what the pcp recommended and that he will not be able to go back to the  paramedicine program,  I made him aware that he could return the call because they him a message to call back.

## 2024-02-10 ENCOUNTER — Ambulatory Visit: Payer: MEDICAID | Admitting: Podiatry

## 2024-02-17 NOTE — Telephone Encounter (Signed)
 Copied from CRM [id number]. Topic: General - Other >> Feb 17, 2024  1:39 PM Rosaria E wrote: Reason for CRM: Therisa pt's wife called to follow up on request for an electric wheelchair, adult diapers, and shower chair. Says she called about this last week.   Best contact: [phone]

## 2024-02-21 ENCOUNTER — Telehealth: Payer: Self-pay | Admitting: Family

## 2024-02-21 ENCOUNTER — Other Ambulatory Visit: Payer: Self-pay | Admitting: Family

## 2024-02-21 DIAGNOSIS — R159 Full incontinence of feces: Secondary | ICD-10-CM

## 2024-02-21 DIAGNOSIS — R2689 Other abnormalities of gait and mobility: Secondary | ICD-10-CM

## 2024-02-21 DIAGNOSIS — R32 Unspecified urinary incontinence: Secondary | ICD-10-CM

## 2024-02-21 MED ORDER — PROCARE ADULT BRIEFS LARGE MISC: 1.0000 | Freq: Every day | 11 refills | Status: DC | PRN

## 2024-02-21 NOTE — Telephone Encounter (Signed)
-   Adult Briefs prescribed (02/21/2024 11:53 AM EDT). - Shower chair prescribed (02/21/24 11:52 AM). - Referral to Physical Therapy for evaluation of need for electric wheelchair. Expect call soon with appointment details.

## 2024-02-21 NOTE — Telephone Encounter (Signed)
 I called patient back with recommendations no one answered so I left a voicemail to return my call

## 2024-02-26 ENCOUNTER — Telehealth: Payer: Self-pay | Admitting: Family

## 2024-02-26 NOTE — Telephone Encounter (Unsigned)
 Copied from CRM [id number]. Topic: Referral - Question >> Feb 25, 2024  1:23 PM Jayma L wrote: Reason for CRM: patient called to verify if a electric wheelchair was sent in, there was this note in system from 02/21/2024 : Referral to Physical Therapy for evaluation of need for electric wheelchair. Expect call soon with appointment details.patient asking to be seen by anyone who takes his insurance. Can be male or male doesn't matter he said. Please callback patient with a update

## 2024-02-26 NOTE — Telephone Encounter (Signed)
 Copied from CRM #[id number]. Topic: General - Other >> Feb 25, 2024  1:31 PM Jayma L wrote: Reason for CRM: patient wants to know where shower chair was sent , please callback patient and advise

## 2024-02-27 NOTE — Telephone Encounter (Signed)
 I returned patient call and number was not working

## 2024-02-27 NOTE — Telephone Encounter (Signed)
 I called patient and phone number was not working

## 2024-03-04 ENCOUNTER — Telehealth: Payer: Self-pay

## 2024-03-04 NOTE — Telephone Encounter (Unsigned)
 Copied from CRM [id number]. Topic: Clinical - Order For Equipment >> Jan 28, 2024  2:51 PM Tobias L wrote: Reason for CRM: Patient's wife inquiring about shower chair and bedside commode. Patient's wife states [employer] does not carry those and orders would need to be sent elsewhere. Wife requesting another order for incontinence supplies.   Wife requesting call back to discuss, [phone]

## 2024-03-04 NOTE — Telephone Encounter (Signed)
 Copied from CRM [id number]. Topic: Clinical - Order For Equipment >> Jan 28, 2024  2:51 PM Tobias L wrote: Reason for CRM: Patient's wife inquiring about shower chair and bedside commode. Patient's wife states [employer] does not carry those and orders would need to be sent elsewhere. Wife requesting another order for incontinence supplies.   Wife requesting call back to discuss, [phone]

## 2024-03-05 NOTE — Telephone Encounter (Signed)
 I called and spoke with patient and wife on his phone number because other number does not work.  I made patient aware that the insurance refused the shower chair and he has to buy one on his own.

## 2024-03-05 NOTE — Telephone Encounter (Signed)
 I called patient number back and it is not working

## 2024-03-09 ENCOUNTER — Telehealth: Payer: Self-pay

## 2024-03-09 NOTE — Telephone Encounter (Signed)
 Routing to office

## 2024-03-11 ENCOUNTER — Telehealth: Payer: Self-pay

## 2024-03-11 NOTE — Telephone Encounter (Signed)
 I apologize . I was unaware message did not attach.

## 2024-03-18 ENCOUNTER — Telehealth: Payer: Self-pay

## 2024-03-18 NOTE — Telephone Encounter (Signed)
 Copied from CRM #[id number]. Topic: General - Other >> Mar 18, 2024 12:13 PM Debby BROCKS wrote: Reason for CRM: Patient would like to know If its possible to get a copy of the paperwork that Dr. Lorren signed stating the patient needed 24 hour care and when it can be picked up

## 2024-03-19 ENCOUNTER — Ambulatory Visit: Payer: Self-pay

## 2024-03-19 NOTE — Telephone Encounter (Signed)
 FYI Only or Action Required?: Action required by provider: request for documentation or forms.  Patient was last seen in primary care on 02/03/2024 by Lorren Greig PARAS, NP.  Called Nurse Triage reporting No chief complaint on file..   Triage Disposition: No disposition on file.  Patient/caregiver understands and will follow disposition?:    Copied from CRM [id number]. Topic: Clinical - Red Word Triage >> Mar 19, 2024 10:44 AM Jayma L wrote: Red Word that prompted transfer to Nurse Triage: patient is unable to walk and getting worse Reason for Disposition . [1] Caller requesting NON-URGENT health information AND [2] PCP's office is the best resource  Answer Assessment - Initial Assessment Questions 1. REASON FOR CALL: What is the main reason for your call? or How can I best help you?     I called about paperwork yesterday, and was calling to see when I could come pick it up,  2. SYMPTOMS : Do you have any symptoms?      No  3. OTHER QUESTIONS: Do you have any other questions?     No  During triage call, coordination support was provided by Mariellen Diesel, clinic representative via the CAL.   Miss Diesel facilitated timely allocation of pertinent medical records requested by the patient streamlining the triage process.  Her professionalism and efficiency contributed to prompt continuity of care. Great appreciation is noted for the collaborative support.  Protocols used: Information Only Call - No Triage-A-AH

## 2024-03-24 NOTE — Telephone Encounter (Signed)
 Schedule appointment for paperwork completion.

## 2024-03-26 NOTE — Telephone Encounter (Signed)
 Pt scheduled

## 2024-04-01 NOTE — Therapy (Incomplete)
 OUTPATIENT PHYSICAL THERAPY NEURO EVALUATION   Patient Name: Craig Schmidt MRN: [id number] DOB:10-26-1970, 53 y.o., male Today's Date: 04/01/2024   PCP: Lorren Greig PARAS, NP REFERRING PROVIDER: Lorren Greig PARAS, NP  END OF SESSION:   Past Medical History:  Diagnosis Date   Alcohol  abuse    Asthma    Brain tumor (HCC)    CAD (coronary artery disease)    Chronic HFrEF (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)    Chronic kidney disease, stage 3 (HCC)    Cocaine  abuse (HCC)    History of medication noncompliance    Homelessness    MI (myocardial infarction) (HCC)    STEMI (ST elevation myocardial infarction) (HCC)    Stroke [facility])    Past Surgical History:  Procedure Laterality Date   BRONCHIAL NEEDLE ASPIRATION BIOPSY  07/03/2023   Procedure: BRONCHIAL NEEDLE ASPIRATION BIOPSIES;  Surgeon: Claudene Toribio BROCKS, MD;  Location: [facility] ENDOSCOPY;  Service: Pulmonary;;   BRONCHIAL WASHINGS  07/03/2023   Procedure: BRONCHIAL WASHINGS;  Surgeon: Claudene Toribio BROCKS, MD;  Location: [facility] ENDOSCOPY;  Service: Pulmonary;;   BUBBLE STUDY  09/11/2021   Procedure: BUBBLE STUDY;  Surgeon: Okey Vina GAILS, MD;  Location: [facility] ENDOSCOPY;  Service: Cardiovascular;;   LEFT HEART CATH AND CORONARY ANGIOGRAPHY N/A 12/19/2021   Procedure: LEFT HEART CATH AND CORONARY ANGIOGRAPHY;  Surgeon: Claudene Victory ORN, MD;  Location: [facility] INVASIVE CV LAB;  Service: Cardiovascular;  Laterality: N/A;   RIGHT/LEFT HEART CATH AND CORONARY ANGIOGRAPHY N/A 04/13/2022   Procedure: RIGHT/LEFT HEART CATH AND CORONARY ANGIOGRAPHY;  Surgeon: Rolan Ezra RAMAN, MD;  Location: [facility] INVASIVE CV LAB;  Service: Cardiovascular;  Laterality: N/A;   TEE WITHOUT CARDIOVERSION N/A 09/11/2021   Procedure: TRANSESOPHAGEAL ECHOCARDIOGRAM (TEE);  Surgeon: Okey Vina GAILS, MD;  Location: [facility] ENDOSCOPY;  Service: Cardiovascular;  Laterality: N/A;   VIDEO BRONCHOSCOPY WITH ENDOBRONCHIAL ULTRASOUND N/A 07/03/2023   Procedure: VIDEO BRONCHOSCOPY WITH  ENDOBRONCHIAL ULTRASOUND;  Surgeon: Claudene Toribio BROCKS, MD;  Location: [facility] ENDOSCOPY;  Service: Pulmonary;  Laterality: N/A;   Patient Active Problem List   Diagnosis Date Noted   Hypertension, malignant 09/04/2023   HFrEF (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) 09/04/2023   Coronary artery disease 06/27/2023   History of CVA (cerebrovascular accident) 06/27/2023   Substance use disorder 06/27/2023   Atypical chest pain 06/21/2023   Pulmonary infiltrate 06/21/2023   Multifocal pneumonia 06/11/2023   Hyperkalemia 06/11/2023   Hyponatremia 06/11/2023   Normocytic anemia 06/11/2023   Thrombocytosis 06/11/2023   Troponin I above reference range 05/25/2023   CAP (community acquired pneumonia) 05/25/2023   Pneumonia 05/25/2023   Chronic combined systolic and diastolic CHF (congestive heart failure) (HCC) 12/03/2022   Malnutrition of moderate degree 11/30/2022   Acute on chronic combined systolic and diastolic CHF (congestive heart failure) (HCC) 11/29/2022   Acute on chronic combined systolic (congestive) and diastolic (congestive) heart failure (HCC) 11/28/2022   ACS (acute coronary syndrome) (HCC) 11/28/2022   Back pain 11/28/2022   Essential hypertension 11/28/2022   Hyperlipidemia 11/28/2022   Noncompliance with medication regimen 11/28/2022   ST elevation myocardial infarction (STEMI) of inferolateral wall, subsequent episode of care (HCC) 12/19/2021   ST elevation myocardial infarction (STEMI) (HCC)    Ischemic cardiomyopathy    Cocaine  abuse (HCC)    Smoker    Alcohol  abuse    Acute CVA (cerebrovascular accident) (HCC) 09/08/2021   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   Tear of MCL (medial collateral ligament) of knee 05/20/2015  Protein-calorie malnutrition, severe 05/17/2015   Lactic acidosis 05/15/2015   Left knee pain 05/15/2015   Lung nodules 05/15/2015   Hypoglycemia 05/14/2015   Hypothermia 05/14/2015   Acute encephalopathy 05/14/2015    Cocaine  abuse with intoxication (HCC) 05/14/2015   Alcohol  intoxication in active alcoholic (HCC) 05/14/2015   Sepsis (HCC) 05/14/2015   Homeless 05/14/2015    ONSET DATE: 02/21/2024  REFERRING DIAG: R26.89 (ICD-10-CM) - Decreased mobility  THERAPY DIAG:  No diagnosis found.  Rationale for Evaluation and Treatment: {HABREHAB:27488}  SUBJECTIVE:                                                                                                                                                                                             SUBJECTIVE STATEMENT: *** Pt accompanied by: {accompnied:27141}  PERTINENT HISTORY: Pt presented to ED 2/12 with L sided weakness and confusion. MRI found a right parietal infarct as well as other small punctate infarcts. PMH: asthma, afib, CAD, CVA (2023), chronic combined CHF (ER 30-35% and grade 2 DD in 03/2022), L frontoparietal infarct with residual R sided weakness, cocaine  abuse/ETOH abuse =, and brain tumor.  PAIN:  Are you having pain? {OPRCPAIN:27236}  PRECAUTIONS: {Therapy precautions:24002}  RED FLAGS: {PT Red Flags:29287}   WEIGHT BEARING RESTRICTIONS: {Yes ***/No:24003}  FALLS: Has patient fallen in last 6 months? {fallsyesno:27318}  LIVING ENVIRONMENT: Lives with: {OPRC lives with:25569::lives with their family} Lives in: {Lives in:25570} Stairs: {opstairs:27293} Has following equipment at home: {Assistive devices:23999}  PLOF: {PLOF:24004}  PATIENT GOALS: ***  OBJECTIVE:  Note: Objective measures were completed at Evaluation unless otherwise noted.  DIAGNOSTIC FINDINGS: ***  COGNITION: Overall cognitive status: {cognition:24006}   SENSATION: {sensation:27233}  COORDINATION: ***  EDEMA:  {edema:24020}  MUSCLE TONE: {LE tone:25568}  MUSCLE LENGTH: Hamstrings: Right *** deg; Left *** deg Debby test: Right *** deg; Left *** deg  DTRs:  {DTR SITE:24025}  POSTURE: {posture:25561}  LOWER EXTREMITY ROM:      {AROM/PROM:27142}  Right Eval Left Eval  Hip flexion    Hip extension    Hip abduction    Hip adduction    Hip internal rotation    Hip external rotation    Knee flexion    Knee extension    Ankle dorsiflexion    Ankle plantarflexion    Ankle inversion    Ankle eversion     (Blank rows = not tested)  LOWER EXTREMITY MMT:    MMT Right Eval Left Eval  Hip flexion    Hip extension    Hip abduction    Hip adduction    Hip internal rotation    Hip external  rotation    Knee flexion    Knee extension    Ankle dorsiflexion    Ankle plantarflexion    Ankle inversion    Ankle eversion    (Blank rows = not tested)  BED MOBILITY:  {bed mobility:32615:p}  TRANSFERS: {transfers eval:32620}  RAMP:  {ramp eval:32616}  CURB:  {curb eval:32617}  STAIRS: {stairs eval:32618} GAIT: Findings: {GaitneuroPT:32644::Distance walked: ***,Comments: ***}  FUNCTIONAL TESTS:  {Functional tests:24029}  PATIENT SURVEYS:  {rehab surveys:24030}                                                                                                                              TREATMENT DATE: ***    PATIENT EDUCATION: Education details: *** Person educated: {Person educated:25204} Education method: {Education Method:25205} Education comprehension: {Education Comprehension:25206}  HOME EXERCISE PROGRAM: ***  GOALS: Goals reviewed with patient? {yes/no:20286}  SHORT TERM GOALS: Target date: ***  *** Baseline: Goal status: INITIAL  2.  *** Baseline:  Goal status: INITIAL  3.  *** Baseline:  Goal status: INITIAL  4.  *** Baseline:  Goal status: INITIAL  5.  *** Baseline:  Goal status: INITIAL  6.  *** Baseline:  Goal status: INITIAL  LONG TERM GOALS: Target date: ***  *** Baseline:  Goal status: INITIAL  2.  *** Baseline:  Goal status: INITIAL  3.  *** Baseline:  Goal status: INITIAL  4.  *** Baseline:  Goal status: INITIAL  5.   *** Baseline:  Goal status: INITIAL  6.  *** Baseline:  Goal status: INITIAL  ASSESSMENT:  CLINICAL IMPRESSION: Patient is a *** y.o. *** who was seen today for physical therapy evaluation and treatment for ***.   OBJECTIVE IMPAIRMENTS: {opptimpairments:25111}.   ACTIVITY LIMITATIONS: {activitylimitations:27494}  PARTICIPATION LIMITATIONS: {participationrestrictions:25113}  PERSONAL FACTORS: {Personal factors:25162} are also affecting patient's functional outcome.   REHAB POTENTIAL: {rehabpotential:25112}  CLINICAL DECISION MAKING: {clinical decision making:25114}  EVALUATION COMPLEXITY: {Evaluation complexity:25115}  PLAN:  PT FREQUENCY: {rehab frequency:25116}  PT DURATION: {rehab duration:25117}  PLANNED INTERVENTIONS: {rehab planned interventions:25118::97110-Therapeutic exercises,97530- Therapeutic [phone]- Neuromuscular re-education,97535- Self Rjmz,02859- Manual therapy}  PLAN FOR NEXT SESSION: ***   Sheffield LOISE Senate, PT, DPT 04/01/2024, 12:28 PM

## 2024-04-02 ENCOUNTER — Ambulatory Visit: Payer: MEDICAID | Admitting: Physical Therapy

## 2024-04-08 ENCOUNTER — Telehealth: Payer: Self-pay | Admitting: Emergency Medicine

## 2024-04-08 NOTE — Telephone Encounter (Unsigned)
 Copied from CRM [id number]. Topic: Clinical - Order For Equipment >> Apr 08, 2024  4:13 PM Winona R wrote:  Pt calling to request a order for an electric wheel cheer. Pt states PT told him he would need that before they can order.

## 2024-04-08 NOTE — Therapy (Deleted)
 OUTPATIENT PHYSICAL THERAPY NEURO EVALUATION   Patient Name: Craig Schmidt MRN: [id number] DOB:[birth year], 53 y.o., male Today's Date: 04/08/2024   PCP: Lorren Greig PARAS, NP REFERRING PROVIDER: Lorren Greig PARAS, NP  END OF SESSION:   Past Medical History:  Diagnosis Date   Alcohol  abuse    Asthma    Brain tumor (HCC)    CAD (coronary artery disease)    Chronic HFrEF (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)    Chronic kidney disease, stage 3 (HCC)    Cocaine  abuse (HCC)    History of medication noncompliance    Homelessness    MI (myocardial infarction) (HCC)    STEMI (ST elevation myocardial infarction) (HCC)    Stroke [facility])    Past Surgical History:  Procedure Laterality Date   BRONCHIAL NEEDLE ASPIRATION BIOPSY  07/03/2023   Procedure: BRONCHIAL NEEDLE ASPIRATION BIOPSIES;  Surgeon: Claudene Toribio BROCKS, MD;  Location: [facility] ENDOSCOPY;  Service: Pulmonary;;   BRONCHIAL WASHINGS  07/03/2023   Procedure: BRONCHIAL WASHINGS;  Surgeon: Claudene Toribio BROCKS, MD;  Location: [facility] ENDOSCOPY;  Service: Pulmonary;;   BUBBLE STUDY  09/11/2021   Procedure: BUBBLE STUDY;  Surgeon: Okey Vina GAILS, MD;  Location: [facility] ENDOSCOPY;  Service: Cardiovascular;;   LEFT HEART CATH AND CORONARY ANGIOGRAPHY N/A 12/19/2021   Procedure: LEFT HEART CATH AND CORONARY ANGIOGRAPHY;  Surgeon: Claudene Victory ORN, MD;  Location: [facility] INVASIVE CV LAB;  Service: Cardiovascular;  Laterality: N/A;   RIGHT/LEFT HEART CATH AND CORONARY ANGIOGRAPHY N/A 04/13/2022   Procedure: RIGHT/LEFT HEART CATH AND CORONARY ANGIOGRAPHY;  Surgeon: Rolan Ezra RAMAN, MD;  Location: [facility] INVASIVE CV LAB;  Service: Cardiovascular;  Laterality: N/A;   TEE WITHOUT CARDIOVERSION N/A 09/11/2021   Procedure: TRANSESOPHAGEAL ECHOCARDIOGRAM (TEE);  Surgeon: Okey Vina GAILS, MD;  Location: [facility] ENDOSCOPY;  Service: Cardiovascular;  Laterality: N/A;   VIDEO BRONCHOSCOPY WITH ENDOBRONCHIAL ULTRASOUND N/A 07/03/2023   Procedure: VIDEO BRONCHOSCOPY WITH  ENDOBRONCHIAL ULTRASOUND;  Surgeon: Claudene Toribio BROCKS, MD;  Location: [facility] ENDOSCOPY;  Service: Pulmonary;  Laterality: N/A;   Patient Active Problem List   Diagnosis Date Noted   Hypertension, malignant 09/04/2023   HFrEF (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) 09/04/2023   Coronary artery disease 06/27/2023   History of CVA (cerebrovascular accident) 06/27/2023   Substance use disorder 06/27/2023   Atypical chest pain 06/21/2023   Pulmonary infiltrate 06/21/2023   Multifocal pneumonia 06/11/2023   Hyperkalemia 06/11/2023   Hyponatremia 06/11/2023   Normocytic anemia 06/11/2023   Thrombocytosis 06/11/2023   Troponin I above reference range 05/25/2023   CAP (community acquired pneumonia) 05/25/2023   Pneumonia 05/25/2023   Chronic combined systolic and diastolic CHF (congestive heart failure) (HCC) 12/03/2022   Malnutrition of moderate degree 11/30/2022   Acute on chronic combined systolic and diastolic CHF (congestive heart failure) (HCC) 11/29/2022   Acute on chronic combined systolic (congestive) and diastolic (congestive) heart failure (HCC) 11/28/2022   ACS (acute coronary syndrome) (HCC) 11/28/2022   Back pain 11/28/2022   Essential hypertension 11/28/2022   Hyperlipidemia 11/28/2022   Noncompliance with medication regimen 11/28/2022   ST elevation myocardial infarction (STEMI) of inferolateral wall, subsequent episode of care (HCC) 12/19/2021   ST elevation myocardial infarction (STEMI) (HCC)    Ischemic cardiomyopathy    Cocaine  abuse (HCC)    Smoker    Alcohol  abuse    Acute CVA (cerebrovascular accident) (HCC) 09/08/2021   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   Tear of MCL (medial collateral ligament) of knee 05/20/2015  Protein-calorie malnutrition, severe 05/17/2015   Lactic acidosis 05/15/2015   Left knee pain 05/15/2015   Lung nodules 05/15/2015   Hypoglycemia 05/14/2015   Hypothermia 05/14/2015   Acute encephalopathy 05/14/2015    Cocaine  abuse with intoxication (HCC) 05/14/2015   Alcohol  intoxication in active alcoholic (HCC) 05/14/2015   Sepsis (HCC) 05/14/2015   Homeless 05/14/2015    ONSET DATE: 02/21/2024  REFERRING DIAG: R26.89 (ICD-10-CM) - Decreased mobility  THERAPY DIAG:  No diagnosis found.  Rationale for Evaluation and Treatment: {HABREHAB:27488}  SUBJECTIVE:                                                                                                                                                                                             SUBJECTIVE STATEMENT: *** Pt accompanied by: {accompnied:27141}  PERTINENT HISTORY: Pt presented to ED 09/04/23 with L sided weakness and confusion. MRI found a right parietal infarct as well as other small punctate infarcts. PMH: asthma, afib, CAD, CVA (2023), chronic combined CHF (ER 30-35% and grade 2 DD in 03/2022), L frontoparietal infarct with residual R sided weakness, cocaine  abuse/ETOH abuse, and brain tumor. Pt received inpatient rehab and discharged 09/19/23  PAIN:  Are you having pain? {OPRCPAIN:27236}  PRECAUTIONS: {Therapy precautions:24002}  RED FLAGS: {PT Red Flags:29287}   WEIGHT BEARING RESTRICTIONS: {Yes ***/No:24003}  FALLS: Has patient fallen in last 6 months? {fallsyesno:27318}  LIVING ENVIRONMENT: Lives with: {OPRC lives with:25569::lives with their family} Lives in: {Lives in:25570} Stairs: {opstairs:27293} Has following equipment at home: {Assistive devices:23999}  PLOF: {PLOF:24004}  PATIENT GOALS: ***  OBJECTIVE:  Note: Objective measures were completed at Evaluation unless otherwise noted.  DIAGNOSTIC FINDINGS: ***  COGNITION: Overall cognitive status: {cognition:24006}   SENSATION: {sensation:27233}  COORDINATION: ***  EDEMA:  {edema:24020}  MUSCLE TONE: {LE tone:25568}  MUSCLE LENGTH: Hamstrings: Right *** deg; Left *** deg Debby test: Right *** deg; Left *** deg  DTRs:  {DTR  SITE:24025}  POSTURE: {posture:25561}  LOWER EXTREMITY ROM:     {AROM/PROM:27142}  Right Eval Left Eval  Hip flexion    Hip extension    Hip abduction    Hip adduction    Hip internal rotation    Hip external rotation    Knee flexion    Knee extension    Ankle dorsiflexion    Ankle plantarflexion    Ankle inversion    Ankle eversion     (Blank rows = not tested)  LOWER EXTREMITY MMT:    MMT Right Eval Left Eval  Hip flexion    Hip extension    Hip abduction    Hip adduction    Hip internal  rotation    Hip external rotation    Knee flexion    Knee extension    Ankle dorsiflexion    Ankle plantarflexion    Ankle inversion    Ankle eversion    (Blank rows = not tested)  BED MOBILITY:  {bed mobility:32615:p}  TRANSFERS: {transfers eval:32620}  RAMP:  {ramp eval:32616}  CURB:  {curb eval:32617}  STAIRS: {stairs eval:32618} GAIT: Findings: {GaitneuroPT:32644::Distance walked: ***,Comments: ***}  FUNCTIONAL TESTS:  {Functional tests:24029}  PATIENT SURVEYS:  {rehab surveys:24030}                                                                                                                              TREATMENT DATE: ***    PATIENT EDUCATION: Education details: *** Person educated: {Person educated:25204} Education method: {Education Method:25205} Education comprehension: {Education Comprehension:25206}  HOME EXERCISE PROGRAM: ***  GOALS: Goals reviewed with patient? {yes/no:20286}  SHORT TERM GOALS: Target date: ***  *** Baseline: Goal status: INITIAL  2.  *** Baseline:  Goal status: INITIAL  3.  *** Baseline:  Goal status: INITIAL  4.  *** Baseline:  Goal status: INITIAL  5.  *** Baseline:  Goal status: INITIAL  6.  *** Baseline:  Goal status: INITIAL  LONG TERM GOALS: Target date: ***  *** Baseline:  Goal status: INITIAL  2.  *** Baseline:  Goal status: INITIAL  3.  *** Baseline:  Goal status:  INITIAL  4.  *** Baseline:  Goal status: INITIAL  5.  *** Baseline:  Goal status: INITIAL  6.  *** Baseline:  Goal status: INITIAL  ASSESSMENT:  CLINICAL IMPRESSION: Patient is a *** y.o. *** who was seen today for physical therapy evaluation and treatment for ***.   OBJECTIVE IMPAIRMENTS: {opptimpairments:25111}.   ACTIVITY LIMITATIONS: {activitylimitations:27494}  PARTICIPATION LIMITATIONS: {participationrestrictions:25113}  PERSONAL FACTORS: {Personal factors:25162} are also affecting patient's functional outcome.   REHAB POTENTIAL: {rehabpotential:25112}  CLINICAL DECISION MAKING: {clinical decision making:25114}  EVALUATION COMPLEXITY: {Evaluation complexity:25115}  PLAN:  PT FREQUENCY: {rehab frequency:25116}  PT DURATION: {rehab duration:25117}  PLANNED INTERVENTIONS: {rehab planned interventions:25118::97110-Therapeutic exercises,97530- Therapeutic [phone]- Neuromuscular re-education,97535- Self Rjmz,02859- Manual therapy}  PLAN FOR NEXT SESSION: ***   [place] LOISE Senate, PT, DPT 04/08/2024, 12:46 PM

## 2024-04-09 ENCOUNTER — Ambulatory Visit: Payer: MEDICAID | Admitting: Physical Therapy

## 2024-04-09 ENCOUNTER — Other Ambulatory Visit: Payer: Self-pay | Admitting: Family

## 2024-04-09 DIAGNOSIS — R262 Difficulty in walking, not elsewhere classified: Secondary | ICD-10-CM

## 2024-04-09 NOTE — Telephone Encounter (Signed)
 Complete

## 2024-04-10 NOTE — Telephone Encounter (Signed)
 I  called patient and made his aware that order for electric wheelchair was put in

## 2024-04-20 ENCOUNTER — Telehealth: Payer: Self-pay | Admitting: Emergency Medicine

## 2024-04-20 NOTE — Telephone Encounter (Signed)
 DME Wheelchair electric ordered on 04/09/24 12:52 PM.

## 2024-04-20 NOTE — Telephone Encounter (Signed)
 I called patient and made him aware of PCP recommendations and he stated he will call back and schedule an appointment

## 2024-04-20 NOTE — Telephone Encounter (Signed)
 Copied from CRM #[id number]. Topic: Clinical - Medication Question >> Apr 20, 2024  1:00 PM Lauren C wrote: Reason for CRM: Pt wants to know if his current meds can affect kidneys or prostate. Requesting call back to speak about medications, has some concerns. Please call [phone]. OK to speak with sister.

## 2024-04-20 NOTE — Telephone Encounter (Signed)
 Schedule appointment. During the interim report to the Emergency Department/Urgent Care/call 911 for immediate medical evaluation.

## 2024-04-20 NOTE — Telephone Encounter (Signed)
 Copied from CRM [id number]. Topic: Clinical - Order For Equipment >> Apr 08, 2024  4:13 PM Craig Schmidt wrote:  Pt calling to request a order for an electric wheel cheer. Pt states PT told him he would need that before they can order.

## 2024-04-21 ENCOUNTER — Other Ambulatory Visit: Payer: Self-pay | Admitting: Family

## 2024-04-21 ENCOUNTER — Telehealth: Payer: Self-pay | Admitting: Emergency Medicine

## 2024-04-21 ENCOUNTER — Other Ambulatory Visit (HOSPITAL_COMMUNITY): Payer: Self-pay

## 2024-04-21 ENCOUNTER — Telehealth: Payer: Self-pay

## 2024-04-21 DIAGNOSIS — R262 Difficulty in walking, not elsewhere classified: Secondary | ICD-10-CM

## 2024-04-21 DIAGNOSIS — I10 Essential (primary) hypertension: Secondary | ICD-10-CM

## 2024-04-21 MED ORDER — BLOOD PRESSURE MONITOR DEVI
0 refills | Status: DC
  Filled 2024-04-21 – 2024-05-14 (×2): qty 1, 30d supply, fill #0

## 2024-04-21 NOTE — Telephone Encounter (Signed)
 Complete

## 2024-04-21 NOTE — Telephone Encounter (Signed)
 I called patient to let him know that the pcp sent in prescription no one answered so I left a voicemail to return my call.

## 2024-04-21 NOTE — Telephone Encounter (Unsigned)
 Copied from CRM [id number]. Topic: Referral - Question >> Apr 21, 2024 12:21 PM Sophia H wrote: Reason for CRM: Updated referral needed - patient is scheduled to be seen for a wheel chair assessment for Thursday 10/02. Has form that can be faxed over, will send. Needing referral to state specifically that patient is needing to have a wheel chair assessment done. Can be done via Epic.

## 2024-04-21 NOTE — Telephone Encounter (Signed)
 Copied from CRM [id number]. Topic: Clinical - Order For Equipment >> Apr 21, 2024  9:35 AM Corin V wrote: Reason for CRM: Patient is calling to get a script for a blood pressure machine sent to the [facility] [address], [address] [place] [address]

## 2024-04-23 ENCOUNTER — Ambulatory Visit: Payer: MEDICAID | Attending: Family

## 2024-04-23 DIAGNOSIS — R2681 Unsteadiness on feet: Secondary | ICD-10-CM | POA: Insufficient documentation

## 2024-04-23 DIAGNOSIS — R262 Difficulty in walking, not elsewhere classified: Secondary | ICD-10-CM | POA: Insufficient documentation

## 2024-04-23 DIAGNOSIS — M6281 Muscle weakness (generalized): Secondary | ICD-10-CM | POA: Insufficient documentation

## 2024-04-23 DIAGNOSIS — R293 Abnormal posture: Secondary | ICD-10-CM | POA: Insufficient documentation

## 2024-04-23 NOTE — Therapy (Unsigned)
 OUTPATIENT PHYSICAL THERAPY WHEELCHAIR EVALUATION   Patient Name: Craig Schmidt MRN: [id number] DOB:May 20, 1971, 53 y.o., male Today's Date: 04/23/2024  END OF SESSION:  PT End of Session - 04/23/24 1309     Visit Number 1    Number of Visits 1    Authorization Type [employer]    PT Start Time 1318    PT Stop Time 1406    PT Time Calculation (min) 48 min    Equipment Utilized During Treatment Gait belt    Activity Tolerance Patient tolerated treatment well    Behavior During Therapy WFL for tasks assessed/performed          Past Medical History:  Diagnosis Date   Alcohol  abuse    Asthma    Brain tumor (HCC)    CAD (coronary artery disease)    Chronic HFrEF (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)    Chronic kidney disease, stage 3 (HCC)    Cocaine  abuse (HCC)    History of medication noncompliance    Homelessness    MI (myocardial infarction) (HCC)    STEMI (ST elevation myocardial infarction) (HCC)    Stroke [facility])    Past Surgical History:  Procedure Laterality Date   BRONCHIAL NEEDLE ASPIRATION BIOPSY  07/03/2023   Procedure: BRONCHIAL NEEDLE ASPIRATION BIOPSIES;  Surgeon: Claudene Toribio BROCKS, MD;  Location: [facility] ENDOSCOPY;  Service: Pulmonary;;   BRONCHIAL WASHINGS  07/03/2023   Procedure: BRONCHIAL WASHINGS;  Surgeon: Claudene Toribio BROCKS, MD;  Location: [facility] ENDOSCOPY;  Service: Pulmonary;;   BUBBLE STUDY  09/11/2021   Procedure: BUBBLE STUDY;  Surgeon: Okey Vina GAILS, MD;  Location: [facility] ENDOSCOPY;  Service: Cardiovascular;;   LEFT HEART CATH AND CORONARY ANGIOGRAPHY N/A 12/19/2021   Procedure: LEFT HEART CATH AND CORONARY ANGIOGRAPHY;  Surgeon: Claudene Victory ORN, MD;  Location: [facility] INVASIVE CV LAB;  Service: Cardiovascular;  Laterality: N/A;   RIGHT/LEFT HEART CATH AND CORONARY ANGIOGRAPHY N/A 04/13/2022   Procedure: RIGHT/LEFT HEART CATH AND CORONARY ANGIOGRAPHY;  Surgeon: Rolan Ezra RAMAN, MD;  Location: [facility] INVASIVE CV LAB;  Service: Cardiovascular;   Laterality: N/A;   TEE WITHOUT CARDIOVERSION N/A 09/11/2021   Procedure: TRANSESOPHAGEAL ECHOCARDIOGRAM (TEE);  Surgeon: Okey Vina GAILS, MD;  Location: [facility] ENDOSCOPY;  Service: Cardiovascular;  Laterality: N/A;   VIDEO BRONCHOSCOPY WITH ENDOBRONCHIAL ULTRASOUND N/A 07/03/2023   Procedure: VIDEO BRONCHOSCOPY WITH ENDOBRONCHIAL ULTRASOUND;  Surgeon: Claudene Toribio BROCKS, MD;  Location: [facility] ENDOSCOPY;  Service: Pulmonary;  Laterality: N/A;   Patient Active Problem List   Diagnosis Date Noted   Hypertension, malignant 09/04/2023   HFrEF (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) 09/04/2023   Coronary artery disease 06/27/2023   History of CVA (cerebrovascular accident) 06/27/2023   Substance use disorder 06/27/2023   Atypical chest pain 06/21/2023   Pulmonary infiltrate 06/21/2023   Multifocal pneumonia 06/11/2023   Hyperkalemia 06/11/2023   Hyponatremia 06/11/2023   Normocytic anemia 06/11/2023   Thrombocytosis 06/11/2023   Troponin I above reference range 05/25/2023   CAP (community acquired pneumonia) 05/25/2023   Pneumonia 05/25/2023   Chronic combined systolic and diastolic CHF (congestive heart failure) (HCC) 12/03/2022   Malnutrition of moderate degree 11/30/2022   Acute on chronic combined systolic and diastolic CHF (congestive heart failure) (HCC) 11/29/2022   Acute on chronic combined systolic (congestive) and diastolic (congestive) heart failure (HCC) 11/28/2022   ACS (acute coronary syndrome) (HCC) 11/28/2022   Back pain 11/28/2022   Essential hypertension 11/28/2022   Hyperlipidemia 11/28/2022   Noncompliance with medication  regimen 11/28/2022   ST elevation myocardial infarction (STEMI) of inferolateral wall, subsequent episode of care (HCC) 12/19/2021   ST elevation myocardial infarction (STEMI) (HCC)    Ischemic cardiomyopathy    Cocaine  abuse (HCC)    Smoker    Alcohol  abuse    Acute CVA (cerebrovascular accident) (HCC) 09/08/2021   Unilateral vestibular  schwannoma (HCC) 05/20/2015   Tear of MCL (medial collateral ligament) of knee 05/20/2015   Protein-calorie malnutrition, severe 05/17/2015   Lactic acidosis 05/15/2015   Left knee pain 05/15/2015   Lung nodules 05/15/2015   Hypoglycemia 05/14/2015   Hypothermia 05/14/2015   Acute encephalopathy 05/14/2015   Cocaine  abuse with intoxication (HCC) 05/14/2015   Alcohol  intoxication in active alcoholic 05/14/2015   Sepsis (HCC) 05/14/2015   Homeless 05/14/2015    PCP: Greig Drones, NP  REFERRING PROVIDER: Greig Drones, NP   THERAPY DIAG:  Unsteadiness on feet  Abnormal posture  Muscle weakness (generalized)  Difficulty in walking, not elsewhere classified  Rationale for Evaluation and Treatment Rehabilitation  SUBJECTIVE:                                                                                                                                                                                           SUBJECTIVE STATEMENT: Pt presents for wheelchair evaluation. He is currently in a standard wheelchair that does not have a tread on the L wheel and the arm rests come up to ~3in below his acromion process. This wc was given to him by a pastor.   PRECAUTIONS: Fall   WEIGHT BEARING RESTRICTIONS No  PLOF:  Needs assistance with ADLs and Needs assistance with transfers  PATIENT GOALS: to obtain a powerchair     MEDICAL HISTORY:  Primary diagnosis onset: 09/08/2021     Medical Diagnosis with ICD-10 code: Acute CVA- I63.x   [] Progressive disease  Relevant future surgeries:     Height:  5'7  Weight: 159lbs Explain recent changes or trends in weight:      History:  Past Medical History:  Diagnosis Date   Alcohol  abuse    Asthma    Brain tumor (HCC)    CAD (coronary artery disease)    Chronic HFrEF (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)    Chronic kidney disease, stage 3 (HCC)    Cocaine  abuse (HCC)    History of medication noncompliance     Homelessness    MI (myocardial infarction) (HCC)    STEMI (ST elevation myocardial infarction) (HCC)    Stroke (HCC)        Cardio Status:  Functional Limitations:   [] Intact  [x]  Impaired  h/o STEMI  Respiratory Status:  Functional Limitations:   [x] Intact  [] Impaired   [] SOB [] COPD [] O2 Dependent ______LPM  [] Ventilator Dependent  Resp equip:                                                     Objective Measure(s):   Orthotics:   [] Amputee:                                                             [] Prosthesis:     HOME ENVIRONMENT:  [] House [] Condo/town home [x] Apartment [] Asst living [] LTCF         [] Own  [x] Rent   [] Lives alone [x] Lives with others -        significant other                     Hours without assistance: 0  [] Home is accessible to patient           front doorway is too narrow for standard wc (needs to be folded); bathroom doorway too narrow; case manager currently looking for a new handicap-accessible apt                      Storage of wheelchair:  [x] In home   [] Other Comments:        COMMUNITY :  TRANSPORTATION:  [] Car [] Biochemist, clinical [] Adapted w/c Lift []  Ambulance [] Other:                     [x] Sits in wheelchair during transport   Where is w/c stored during transport?  [x] [place] [place]  []  [employer]  r   [] Self-Driver       Drive while in  Biomedical scientist [] yes [x] no   Employment and/or school:  Specific requirements pertaining to mobility        Other:  COMMUNICATION:  Verbal Communication  [x] WFL [] receptive [x] WFL [] expressive [] Understandable  [] Difficult to understand  [] non-communicative  Primary Language:____english__________ 2nd:_____________  Communication provided by:[x] Patient [x] Family [] Caregiver [] Translator   [] Uses an augmentative communication device     Manufacturer/Model :    MOBILITY/BALANCE:  Sitting Balance  Standing Balance  Transfers  Ambulation   [] WFL      [] WFL  [] Independent  []  Independent   [x] Uses UE for  balance in sitting Comments:  [] Uses UE/device for stability Comments:  []  Min assist  []  Ambulates independently with       device:___________________      [x]  Mod assist  []  Able to ambulate ______ feet        safely/functionally/independently   []  Min assist  []  Min assist  []  Max assist  [x]  Non-functional ambulator         History/High risk of falls   []  Mod assist  []  Mod assist  []  Dependent  []  Unable to ambulate   []  Max  assist  [x]  Max assist  Transfer method:[x] 1 person [] 2 person [] sliding board [] squat pivot [x] stand pivot [] mechanical patient lift  [] other:   []  Unable  []  Unable    Fall History: # of falls in the past 6  months? 0 # of "near" falls in the past 6 months? Multiple; mainly when walking even with assist    CURRENT SEATING / MOBILITY:  Current Mobility Device: [x] None [] Cane/Walker [] Manual [] Dependent [] Dependent w/ Tilt rScooter  [] Power (type of control):   Manufacturer:  Model:  Serial #:   Size:  Color:  Age:   Purchased by whom:   Current condition of mobility base:    Current seating system:                                                                       Age of seating system:    Describe posture in present seating system:    Is the current mobility meeting medical necessity?:  [] Yes [] No Describe:     Ability to complete Mobility-Related Activities of Daily Living (MRADL's) with Current Mobility Device:   Move room to room  [] Independent  [] Min [] Mod [] Max assist  [x] Unable  Comments:   Meal prep  [] Independent  [] Min [] Mod [x] Max assist  [] Unable    Feeding  [] Independent  [x] Min [] Mod [] Max assist  [] Unable    Bathing  [] Independent  [] Min [] Mod [x] Max assist  [] Unable    Grooming  [] Independent  [] Min [] Mod [x] Max assist  [] Unable    UE dressing  [] Independent  [] Min [] Mod [x] Max assist  [] Unable    LE dressing  [] Independent   [] Min [] Mod [x] Max assist  [] Unable    Toileting  [] Independent  [] Min [] Mod [x] Max assist  [] Unable    Bowel Mgt:  [x]  Continent []  Incontinent [x]  Accidents []  Diapers []  Colostomy []  Bowel Program:  Bladder Mgt: [x]  Continent []  Incontinent [x]  Accidents []  Diapers []  Urinal []  Intermittent Cath []  Indwelling Cath []  Supra-pubic Cath     Current Mobility Equipment Trialed/ Ruled Out:    Does not meet mobility needs due to:    Mark all boxes that indicate inability to use the specific equipment listed     Meets needs for safe  independent functional  ambulation  / mobility    Risk of  Falling or History of Falls    Enviromental limitations      Cognition    Safety concerns with  physical ability    Decreased / limitations endurance  & strength     Decreased / limitations  motor skills  & coordination    Pain    Pace /  Speed    Cardiac and/or  respiratory condition    Contra - indicated by diagnosis   Cane/Crutches  []   [x]   []   []   [x]   [x]   [x]   []   [x]   [x]   []    Walker / Rollator  []  NA   []   [x]   []   []   [x]   [x]   [x]   []   [x]   [x]   []     Manual Wheelchair K0001-K0007:  []  NA  []   []   []   []   [x]   [x]   [x]   []   [x]   [x]   []    Manual W/C (K0005) with power assist  []  NA  []   []   []   []   []   []   []   []   []   []   []    Scooter  []  NA  []   []   []   []   []   []   []   []   []   []   []   Power Wheelchair: standard joystick  []  NA  [x]   []   []   []   []   []   []   []   []   []   []    Power Wheelchair: alternative controls  []  NA  []   []   []   []   []   []   []   []   []   []   []    Summary:  The least costly alternative for independent functional mobility was found to be:    []  Crutch/Cane  []  Walker []  Manual w/c  []  Manual w/c with power assist   []  Scooter   [x]  Power w/c std joystick   []  Power w/c alternative control        []  Requires dependent care mobility device   Cabin crew for [employer] skills are adequate for safe mobility equipment operation  [x]   Yes []   No  Patient is willing and motivated to use recommended mobility equipment  [x]   Yes []   No       []   Patient is unable to safely operate mobility equipment independently and requires dependent care equipment Comments:           SENSATION and SKIN ISSUES:  Sensation [x]  Intact  []  Impaired []  Absent []  Hyposensate []  Hypersensate  []  Defensiveness  Location(s) of impairment:    Pressure Relief Method(s):  []  Lean side to side to offload (without risk of falling)  []   W/C push up (4+ times/hour for 15+ seconds) []  Stand up (without risk of falling)    []  Other: (Describe): Effective pressure relief method(s) above can be performed consistently throughout the day: [] Yes  [x]  No If not, Why?: Patient lacks adequate UE strength to complete a sufficient wheelchair push up; he is unsafe to stand without significant assist to complete a full pressure relief   Skin Integrity Risk:       []  Low risk           []  Moderate risk            [x]  High risk  If high risk, explain: He is unable to complete any pressure relief techniques (lean side to side, wc push up or standing) safely and without significant risk for falling. He experiences frequent bowel/bladder accidents due to poor mobility increasing his risk for skin breakdown as well.   Skin Issues/Skin Integrity  Current skin Issues  []  Yes [x]  No []  Intact  []   Red area   []   Open area  []  Scar tissue  [x]  At risk from prolonged sitting  Where: sacrum, coccyx, ischial tuberosities  History of Skin Issues  []  Yes [x]  No Where : When: Stage: Hx of skin flap surgeries  []  Yes [x]  No Where:  When:  Pain: []  Yes [x]  No   Pain Location(s):  Intensity scale: (0-10) : How does pain interfere with mobility and/or MRADLs? -    MAT EVALUATION:  Neuro-Muscular Status: (Tone, Reflexive, Responses, etc.)     []   Intact   [x]  Spasticity:  [x]  Hypotonicity  [x]  Fluctuating  []  Muscle Spasms  [x]  Poor Righting Reactions/Poor Equilibrium Reactions  []  Primal Reflex(s):    Comments:            COMMENTS:    POSTURE:     Comments:  Pelvis  Anterior/Posterior:  []  Neutral   [x]  Posterior  []  Anterior  []  Fixed - No movement [x]  Tendency away from neutral [x]  Flexible []  Self-correction [x]  External correction Obliquity (viewed from front)  [  x] [facility] []  R Obliquity []  L Obliquity  []  Fixed - No movement []  Tendency away from neutral []  Flexible []  Self-correction []  External correction Rotation  [x]  WFL []  R anterior []  L anterior  []  Fixed - No movement []  Tendency away from neutral []  Flexible []  Self-correction []  External correction Tonal Influence Pelvis:  []  Normal []  Flaccid [x]  Low tone []  Spasticity []  Dystonia []  Pelvis thrust []  Other:    Trunk Anterior/Posterior:  []  WFL [x]  Thoracic kyphosis []  Lumbar lordosis  []  Fixed - No movement [x]  Tendency away from neutral [x]  Flexible []  Self-correction [x]  External correction  [x]  WFL []  Convex to left  []  Convex to right []  S-curve   []  C-curve []  Multiple curves []  Tendency away from neutral []  Flexible []  Self-correction []  External correction Rotation of shoulders and upper trunk:  [x]  Neutral []  Left-anterior []  Right- anterior []  Fixed- no movement []  Tendency away from neutral []  Flexible []  Self correction []  External correction Tonal influence Trunk:  []  Normal []  Flaccid [x]  Low tone []  Spasticity []  Dystonia []  Other:   Head & Neck  []  Functional [x]  Flexed    []  Extended []  Rotated right  []  Rotated left []  Laterally flexed right []  Laterally flexed left []  Cervical hyperextension   []  Good head control [x]  Adequate head control []  Limited head control []  Absent head control Describe tone/movement of head and neck:    Lower Extremity Measurements: LE MMT:  MMT Right 04/23/2024 Left 04/23/2024  Hip flexion 3 3+  Hip extension    Hip abduction 3+ 3+  Hip adduction 3+ 3+  Knee flexion 3+ 3+  Knee extension 3+ 3+  Ankle dorsiflexion    Ankle plantarflexion     (Blank rows = not tested)  Hip  positions:  [x]  Neutral   []  Abducted   []  Adducted  []  Subluxed   []  Dislocated   []  Fixed   []  Tendency away from neutral []  Flexible []  Self-correction []  External correction   Hip Windswept:[x]  Neutral  []  Right    []  Left  []  Subluxed   []  Dislocated   []  Fixed   []  Tendency away from neutral []  Flexible []  Self-correction []  External correction  LE Tone: []  Normal []  Low tone []  Spasticity []  Flaccid [x]  Dystonia []  Rocks/Extends at hip []  Thrust into knee extension []  Pushes legs downward into footrest  Foot positioning: ROM Concerns: Dorsiflexed: []  Right   []  Left Plantar flexed: []  Right    []  Left Inversion: []  Right    []  Left Eversion: [x]  Right    []  Left  LE Edema: []  1+ (Barely detectable impression when finger is pressed into skin) [x]  2+ (slight indentation. 15 seconds to rebound) []  3+ (deeper indentation. 30 seconds to rebound) []  4+ (>30 seconds to rebound)  UE Measurements:   UPPER EXTREMITY MMT:  MMT Right 04/23/2024 Left 04/23/2024  Shoulder flexion 2- 2-  Shoulder abduction 2 2+  Shoulder adduction    Elbow flexion 3 3  Elbow extension 3 3  Wrist flexion    Wrist extension    Pinch strength    Grip strength ~2lbs ~4lbs  (Blank rows = not tested)  Shoulder Posture:  Right Tendency towards Left  []   Functional []    []   Elevation []    [x]   Depression [x]    [x]   Protraction [x]    []   Retraction []    [x]   Internal rotation [x]    []   External rotation []    []   Subluxed []     UE Tone: []  Normal []  Flaccid []  Low tone [x]  Spasticity  [x]  Dystonia []  Other:   Wrist/Hand: Handedness: [x]  Right   []  Left   []  NA: Comments:  Right  Left  []   WNL []    [x]   Limitations [x]    [x]   Contractures []    []   Fisting []    [x]   Tremors [x]    [x]   Weak grasp [x]    [x]   Poor dexterity [x]    [x]   Hand movement non functional []    []   Paralysis []     MOBILITY BASE RECOMMENDATIONS and JUSTIFICATION:  MOBILITY BASE  JUSTIFICATION    Manufacturer:   Quantum Model:                             J4 Edge Color: blue Seat Width:  20 Seat Depth 18   []  Manual mobility base (continue below)   []  Scooter/POV  [x]  Power mobility base   Number of hours per day spent in above selected mobility base: ~12 hrs  Typical daily mobility base use Schedule: transfers into wc with assistance to participate in daily activities   [x]  is not a safe, functional ambulator  [x]  limitation prevents from completing a MRADL(s) within a reasonable time frame    []  limitation places at high risk of morbidity or mortality secondary to  the attempts to perform a    MRADL(s)  [x]  limitation prevents accomplishing a MRADL(s) entirely  [x]  provide independent mobility  [x]  equipment is a lifetime medical need  [x]  walker or cane inadequate  [x]  any type manual wheelchair      inadequate  [x]  scooter/POV inadequate      []  requires dependent mobility         POWER MOBILITY      []  Scooter/POV    []  can safely operate   []  can safely transfer   []  has adequate trunk stability   []  cannot functionally propel  manual wheelchair    [x]  Power mobility base    [x]  non-ambulatory   [x]  cannot functionally propel manual wheelchair   [x]  cannot functionally and safely      operate scooter/POV  [x]  can safely operate power       wheelchair  [x]  home is accessible  [x]  willing to use power wheelchair     Tilt  [x]  Powered tilt on powered chair  []  Powered tilt on manual chair  []  Manual tilt on manual chair Comments:  [x]  change position for pressure      [x]  elief/cannot weight shift   [x]  change position against      gravitational force on head and      shoulders   []  decrease pain  []  blood pressure management   []  control autonomic dysreflexia  []  decrease respiratory distress  []  management of spasticity  []  management of low tone  [x]  facilitate postural control   [x]  rest periods   [x]  control edema  [x]  increase sitting  tolerance   []  aid with transfers     Recline   []  Power recline on power chair  []  Manual recline on manual chair  Comments:    []  intermittent catheterization  []  manage spasticity  []  accommodate femur to back angle  []  change position for pressure relief/cannot weight shift rhigh risk of pressure sore development  []  tilt alone does not accomplish     effective  pressure relief, maximum pressure relief achieved at -      _______ degrees tilt   _______ degrees recline   []  difficult to transfer to and from bed []  rest periods and sleeping in chair  []  repositioning for transfers  []  bring to full recline for ADL care  []  clothing/diaper changes in chair  []  gravity PEG tube feeding  []  head positioning  []  decrease pain  []  blood pressure management   []  control autonomic dysreflexia  []  decrease respiratory distress  []  user on ventilator     Elevator on mobility base  [x]  Power wheelchair  []  Scooter  [x]  increase Indep in transfers   [x]  increase Indep in ADLs    []  bathroom function and safety  []  kitchen/cooking function and safety  []  shopping  []  raise height for communication at standing level  [x]  raise height for eye contact which reduces cervical neck strain and pain  []  drive at raised height for safety and navigating crowds  []  Other:   []  Vertical position system  (anterior tilt)     (Drive locks-out)    []  Stand       (Drive enabled)  []  independent weight bearing  []  decrease joint contractures  []  decrease/manage spasticity  []  decrease/manage spasms  []  pressure distribution away from   scapula, sacrum, coccyx, and ischial tuberosity  []  increase digestion and elimination   []  access to counters and cabinets  []  increase reach  []  increase interaction with others at eye level, reduces neck strain  []  increase performance of       MRADL(s)      Power elevating legrest    [x]  Center mount (Single) 85-170 degrees       []  Standard (Pair)  100-170 degrees  [x]  position legs at 90 degrees, not available with std power ELR  [x]  center mount tucks into chair to decrease turning radius in home, not available with std power ELR  [x]  provide change in position for LE  []  elevate legs during recline    [x]  maintain placement of feet on      footplate  [x]  decrease edema  [x]  improve circulation  []  actuator needed to elevate legrest  []  actuator needed to articulate legrest preventing knees from flexing  [x]  Increase ground clearance over      curbs  []   STD (pair) independently                     elevate legrest   POWER WHEELCHAIR CONTROLS      Controls/input device  [x]  Expandable  []  Non-expandable  [x]  Proportional  []  Right Hand [x]  Left Hand  []  Non-proportional/switches/head-array  []  Electrical/proximity         []   Mechanical      Manufacturer:___________________   Type:__medium ball handle______________________ [x]  provides access for controlling wheelchair  [x]  programming for accurate control  []  progressive disease/changing condition  []  required for alternative drive      controls       []  lacks motor control to operate  proportional drive control  []  unable to understand proportional controls  [x]  limited movement/strength  [x]  extraneous movement / tremors / ataxic / spastic       [x]  Upgraded electronics controller/harness    []  Single power (tilt or recline)   []  Expandable    []  Non-expandable plus   [x]  Multi-power (tilt, recline, power legrest, power seat lift, vertical positioning system, stand)  [  x] allows input device to communicate with drive motors  []  harness provides necessary connections between the controller, input device, and seat functions     [x]  needed in order to operate power seat functions through joystick/ input device  []  required for alternative drive controls     []  Enhanced display  []  required to connect all alternative drive controls   []  required for upgraded joystick       (lite-throw, heavy duty, micro)  []  Allows user to see in which mode and drive the wheelchair is set; necessary for alternate controls       []  Upgraded tracking electronics  []  correct tracking when on uneven surfaces makes switch driving more efficient and less fatiguing  []  increase safety when driving  []  increase ability to traverse thresholds    []  Safety / reset / mode switches     Type:    []  Used to change modes and stop the wheelchair when driving     [x]  Mount for joystick / input device/switches  [x]  swing away for access or transfers   [x]  attaches joystick / input device / switches to wheelchair   [x]  provides for consistent access  []  midline for optimal placement    [x]  Attendant controlled joystick plus     mount  [x]  safety  [x]  long distance driving  [x]  operation of seat functions  [x]  compliance with transportation regulations    [x]  Battery  [x]  required to power (power assist / scooter/ power wc / other):   []  Power inverter (24V to 12V)  []  required for ventilator / respiratory equipment / other:     CHAIR OPTIONS MANUAL & POWER      Armrests   [x]  adjustable height []  removable  []  swing away []  fixed  [x]  flip back  []  reclining  []  full length pads [x]  desk []  tube arms []  gel pads  [x]  provide support with elbow at 90    [x]  remove/flip back/swing away for  transfers  [x]  provide support and positioning of upper body    [x]  allow to come closer to table top  []  remove for access to tables  []  provide support for w/c tray  [x]  change of height/angles for variable activities   []  Elbow support / Elbow stop  []  keep elbow positioned on arm pad  []  keep arms from falling off arm pad  during tilt and/or recline   Upper Extremity Support  []  Arm trough  []   R  []   L  Style:  []  swivel mount []  fixed mount   []  posterior hand support  []   tray  []  full tray  []  joystick cut out  []   R  []   L  Style:  []  decrease gravitational pull on      shoulders  []   provide support to increase UE  function  []  provide hand support in natural    position  []  position flaccid UE  []  decrease subluxation    []  decrease edema       []  manage spasticity   []  provide midline positioning  []  provide work surface  []  placement for AAC/ Computer/ EADL     Hangers/ Legrests   []  ______ degree  []  Elevating []  articulating  []  swing away []  fixed []  lift off  []  heavy duty  []  adjustable knee angle  []  adjustable calf panel   []  longer extension tube              []   provide LE support  []  maintain placement of feet on      footplate   []  accommodate lower leg length  []  accommodate to hamstring       tightness  []  enable transfers  []  provide change in position for LE's  []  elevate legs during recline    []  decrease edema  []  durability      Foot support   [x]  footplate [x]  R [x]  L [x]  flip up           [x]  Depth adjustable   [x]  angle adjustable  []  foot board/one piece    [x]  provide foot support  [x]  accommodate to ankle ROM  [x]  allow foot to go under wheelchair base  [x]  enable transfers     []  Shoe holders  []  position foot    []  decrease / manage spasticity  []  control position of LE  []  stability    []  safety     []  Ankle strap/heel      loops  []  support foot on foot support  []  decrease extraneous movement  []  provide input to heel   []  protect foot     []  Amputee adapter []  R  []  L     Style:                  Size:  []  Provide support for stump/residual extremity    [x]  Transportation tie-down  [x]  to provide crash tested tie-down brackets    []  Crutch/cane holder    []  O2 holder    []  IV hanger   []  Ventilator tray/mount    []  stabilize accessory on wheelchair       Component  Justification     [x]  Seat cushion     TruComfort []  accommodate impaired sensation  []  decubitus ulcers present or history  [x]  unable to shift weight  [x]  increase pressure distribution  []  prevent pelvic extension  []  custom required  "off-the-shelf"    seat cushion will not accommodate deformity  []  stabilize/promote pelvis alignment  []  stabilize/promote femur alignment  []  accommodate obliquity  []  accommodate multiple deformity  [x]  incontinent/accidents  [x]  low maintenance     []  seat mounts                 []  fixed []  removable  []  attach seat platform/cushion to wheelchair frame    []  Seat wedge    []  provide increased aggressiveness of seat shape to decrease sliding  down in the seat  []  accommodate ROM        []  Cover replacement   []  protect back or seat cushion  []  incontinent/accidents    []  Solid seat / insert    []  support cushion to prevent      hammocking  []  allows attachment of cushion to mobility base    []  Lateral pelvic/thigh/hip     support (Guides)     []  decrease abduction  []  accommodate pelvis  []  position upper legs  []  accommodate spasticity  []  removable for transfers     []  Lateral pelvic/thigh      supports mounts  []  fixed   []  swing-away   []  removable  []  mounts lateral pelvic/thigh supports     []  mounts lateral pelvic/thigh supports swing-away or removable for transfers    []  Medial thigh support ([place]mel)  [] decrease adduction  [] accommodate ROM  []  remove for transfers   []  alignment      []   Medial thigh   []  fixed      support mounts      []  swing-away   []  removable  []  mounts medial thigh supports   []  Mounts medial supports swing- away or removable for transfers      Component  Justification   [x]  Back    Synergy    [x]  provide posterior trunk support []  facilitate tone  [x]  provide lumbar/sacral support []  accommodate deformity  [x]  support trunk in midline   []  custom required "off-the-shelf" back support will not accommodate deformity   []  provide lateral trunk support []  accommodate or decrease tone            []  Back mounts  []  fixed  []  removable  []  attach back rest/cushion to wheelchair frame   []  Lateral trunk      supports  []  R []  L  []  decrease  lateral trunk leaning  []  accommodate asymmetry    []  contour for increased contact  []  safety    []  control of tone    []  Lateral trunk      supports mounts  []  fixed  []  swing-away   []  removable  []  mounts lateral trunk supports     []  Mounts lateral trunk supports swing-away or removable for transfers   []  Anterior chest      strap, vest     []  decrease forward movement of shoulder  []  decrease forward movement of trunk  []  safety/stability  []  added abdominal support  []  trunk alignment  []  assistance with shoulder control   []  decrease shoulder elevation    [x]  Headrest      [x]  provide posterior head support  []  provide posterior neck support  []  provide lateral head support  []  provide anterior head support  [x]  support during tilt and recline  []  improve feeding     []  improve respiration  []  placement of switches  [x]  safety    []  accommodate ROM   []  accommodate tone  []  improve visual orientation   [x]  Headrest           []  fixed [x]  removable []  flip down      Mounting hardware   []  swing-away laterals/switches  []  mount headrest   []  mounts headrest flip down or  removable for transfers  []  mount headrest swing-away laterals   []  mount switches     []  Neck Support    []  decrease neck rotation  []  decrease forward neck flexion   Pelvic Positioner    [x]  std hip belt          []  padded hip belt  []  dual pull hip belt  []  four point hip belt  []  stabilize tone  [x]  decrease falling out of chair  []  prevent excessive extension  []  special pull angle to control      rotation  []  pad for protection over boney   prominence  []  promote comfort    []  Essential needs        bag/pouch   []  medicines []  special food rorthotics []  clothing changes  []  diapers  []  catheter/hygiene []  ostomy supplies   The above equipment has a life- long use expectancy.  Growth and changes in medical and/or functional conditions would be the exceptions.   SUMMARY:  Why mobility  device was selected; include why a lower level device is not appropriate:    ASSESSMENT:  CLINICAL IMPRESSION: Patient is a 53 y.o. male  who was seen today for physical therapy evaluation for a custom power wheelchair. 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 He is not a functional ambulator, requiring substantial assistance to complete a simple stand pivot transfer, let alone ambulate. He is unable to propel a manual wheelchair independently, lightweight or otherwise, due to decreased bilateral upper extremity strength, coordination and endurance. His vision and cognition have been spared allowing him to participate in his MRADLs and a custom power wheelchair would allow him to do so.  He will require a power tilt feature on his custom power wheelchair in order to allow him to complete any and all pressure relief. Given his significantly increased risk for falls, he is unable to stand to complete a full pressure relief. He lacks adequate trunk control to complete sufficient lateral leans. He also lacks sufficient upper extremity strength to complete a wheelchair push up. He will have episodes of bowel and bladder incontinence due to his limited mobility and this further increases his risk for skin break down development of pressure injuries. A power tilt feature would allow the patient to complete a full and adequate pressure relief independently and safely to prevent development of pressure injuries and grave sequelae. Given that he has suffered bilateral CVAs, his trunk control is limited; the tilt feature would allow for increased postural support. It would also provide him with a safe means of rest to increase his sitting tolerance in his wheelchair during the day.  A power elevate feature will allow the patient to participate more fully in his MRADLs and reduce caregiver burden by allowing him to elevate to reduce strain on the caregivers back. He  does require assistance for transfers in and out of his wheelchair, so if his caregiver were to become injured by repetitive bending strain, they would be unable to safely assist the patient further. An elevate feature would also decrease strain on the patients cervical spine by minimizing his need for persistent cervical extension to carry out conversations.  Power elevating leg rests will provide adequate lower extremity support, but also allow for the legs to move underneath his seat to minimize the footprint of his wheelchair to allow for access to tighter spaces within his home, such as the kitchen and bathroom. Flip up foot plates would also allow for safe transfers in and out of the wheelchair with assistance. Given that he requires tilt for appropriate changes in position, elevating foot rests are a must to accommodate his lower extremities. He also has 2+ pitting edema in his bilateral lower extremities due to his immobility and CHF. He must be able to tilt to assist with managing his edema and minimize his risk for developing blood clots.  Given that his custom power wheelchair require tilt, elevating leg rests and a seat elevator to optimize his independence, safety and function- he requires expandable proportional controls. He will require a medium ball handle to control the wheelchair given his poor dexterity. As well, his right dominant hand is much more impaired than his non dominant left hand to the extent that he must drive the wheelchair with his non-dominant hand. However; his left upper extremity is also profoundly impaired and with fatigue (such as using it to drive longer distances in the wheelchair) his dexterity becomes worse and his extraneous movements increase. For this reason, he will require an attendant joystick for safety. His significant other is with him at all times and would be available and able  to drive the wheelchair using the attendant joystick. He also relies on public  transportation to attend therapy and doctors appointments, so having an attendant joystick would make the transfer onto transportation much safer. The patients medium ball handle must be mounted on the left and must be swing away to assist with safe transfers in and out of his wheelchair.  A battery is essential for him to be able to use his power wheelchair to participate in his MRADLs as independently and safely as possible. A seat belt will assist with minimizing his risk for falling out of his wheelchair. A removable headrest will provide posterior head support while he uses the tilt feature for pressure relief and postural control.  Height adjustable, desk length, flip back arm rests will provide the patient with adequate upper extremity support. With them being desk length, it will allow him to approach various work surfaces to participate in his MRADLs. They must be flip back to allow for safe transfers in and out of his wheelchair.  A TruComfort cushion will provide the patient with adequate pressure relief, but will be low maintenance enough and appropriate given that he has bowel/bladder accidents due to his limited mobility. A Synergy back will provide the patient with posterior trunk support and assist with midline alignment and postural control while he is seated in his power wheelchair to participate in his MRADLs.  He is unable to use a manual tilt in space wheelchair as an alternative to a Quantum J4 Edge. His vision and cognition are both functional and he should be allowed to participate as fully as possible in his MRADLs. He does not require completely dependent mobility with a caregiver simply pushing him a wheelchair. However, he lacks the bilateral upper extremity strength, endurance, coordination and dexterity to push himself in a manual wheelchair or to drive himself in a power wheelchair longer distances. He is also at a significantly increased risk for developing pressure injuries given  his severely limited mobility and independence with weight shifts/transfers; he is also intermittently incontinent further exacerbating this risk. A power wheelchair would allow him to independently complete a full pressure relief whereas a manual tilt in space would require a second person to tilt him in the wheelchair. A custom power wheelchair is a lifelong medical need that will allow the patient to participate in his MRADLs as efficiently, independently and safely as possible.   OBJECTIVE IMPAIRMENTS Abnormal gait, cardiopulmonary status limiting activity, decreased activity tolerance, decreased balance, decreased coordination, decreased endurance, decreased knowledge of condition, decreased knowledge of use of DME, decreased mobility, and decreased ROM.   ACTIVITY LIMITATIONS bending, standing, squatting, stairs, transfers, bed mobility, continence, bathing, toileting, dressing, reach over head, hygiene/grooming, locomotion level, and caring for others  PARTICIPATION LIMITATIONS: meal prep, cleaning, laundry, interpersonal relationship, driving, shopping, and community activity  PERSONAL FACTORS Fitness, Past/current experiences, Social background, Time since onset of injury/illness/exacerbation, and Transportation are also affecting patient's functional outcome.   REHAB POTENTIAL: Good  CLINICAL DECISION MAKING: Stable/uncomplicated  EVALUATION COMPLEXITY: High       GOALS: One time visit. No goals established.  PLAN: PT FREQUENCY: one time visit   Delon DELENA Pop, PT Delon DELENA Pop, PT, DPT, CBIS  04/23/2024, 3:02 PM    I concur with the above findings and recommendations of the therapist:  Physician name printed:         Physician's signature:      Date:

## 2024-04-30 ENCOUNTER — Other Ambulatory Visit (HOSPITAL_COMMUNITY): Payer: Self-pay

## 2024-05-08 ENCOUNTER — Telehealth: Payer: Self-pay | Admitting: Family

## 2024-05-08 ENCOUNTER — Ambulatory Visit (INDEPENDENT_AMBULATORY_CARE_PROVIDER_SITE_OTHER): Payer: MEDICAID | Admitting: Family

## 2024-05-08 ENCOUNTER — Encounter: Payer: Self-pay | Admitting: Family

## 2024-05-08 VITALS — BP 138/84 | HR 79 | Temp 97.5°F | Resp 16

## 2024-05-08 DIAGNOSIS — Z125 Encounter for screening for malignant neoplasm of prostate: Secondary | ICD-10-CM

## 2024-05-08 DIAGNOSIS — Z0289 Encounter for other administrative examinations: Secondary | ICD-10-CM | POA: Diagnosis not present

## 2024-05-08 DIAGNOSIS — Z23 Encounter for immunization: Secondary | ICD-10-CM

## 2024-05-08 NOTE — Progress Notes (Unsigned)
 Prostate check and forms filled out

## 2024-05-08 NOTE — Telephone Encounter (Signed)
 Copied from CRM #[id number]. Topic: General - Other >> May 08, 2024 10:22 AM Richerd NOVAK wrote: Shallin from [facility], called requested email to send fl2 form,couldnt get office to get this info. Caller will send this another way, but still will lie email. Patient appt is today

## 2024-05-08 NOTE — Progress Notes (Unsigned)
 Patient ID: Craig Schmidt, male    DOB: 09-Dec-1970  MRN: [id number]  CC: Follow-Up  Subjective: Craig Schmidt is a 53 y.o. male who presents for follow-up. Patient is accompanied by significant other in-person and case manager by phone  His concerns today include:  - Patient has [facility] FL2 Form for completion.  - Prostate screening. Denies red flag symptoms.   Patient Active Problem List   Diagnosis Date Noted   Hypertension, malignant 09/04/2023   HFrEF (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) 09/04/2023   Coronary artery disease 06/27/2023   History of CVA (cerebrovascular accident) 06/27/2023   Substance use disorder 06/27/2023   Atypical chest pain 06/21/2023   Pulmonary infiltrate 06/21/2023   Multifocal pneumonia 06/11/2023   Hyperkalemia 06/11/2023   Hyponatremia 06/11/2023   Normocytic anemia 06/11/2023   Thrombocytosis 06/11/2023   Troponin I above reference range 05/25/2023   CAP (community acquired pneumonia) 05/25/2023   Pneumonia 05/25/2023   Chronic combined systolic and diastolic CHF (congestive heart failure) (HCC) 12/03/2022   Malnutrition of moderate degree 11/30/2022   Acute on chronic combined systolic and diastolic CHF (congestive heart failure) (HCC) 11/29/2022   Acute on chronic combined systolic (congestive) and diastolic (congestive) heart failure (HCC) 11/28/2022   ACS (acute coronary syndrome) (HCC) 11/28/2022   Back pain 11/28/2022   Essential hypertension 11/28/2022   Hyperlipidemia 11/28/2022   Noncompliance with medication regimen 11/28/2022   ST elevation myocardial infarction (STEMI) of inferolateral wall, subsequent episode of care (HCC) 12/19/2021   ST elevation myocardial infarction (STEMI) (HCC)    Ischemic cardiomyopathy    Cocaine  abuse (HCC)    Smoker    Alcohol  abuse    Acute CVA (cerebrovascular accident) (HCC) 09/08/2021   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   Tear of MCL (medial  collateral ligament) of knee 05/20/2015   Protein-calorie malnutrition, severe 05/17/2015   Lactic acidosis 05/15/2015   Left knee pain 05/15/2015   Lung nodules 05/15/2015   Hypoglycemia 05/14/2015   Hypothermia 05/14/2015   Acute encephalopathy 05/14/2015   Cocaine  abuse with intoxication (HCC) 05/14/2015   Alcohol  intoxication in active alcoholic 05/14/2015   Sepsis (HCC) 05/14/2015   Homeless 05/14/2015     Current Outpatient Medications on File Prior to Visit  Medication Sig Dispense Refill   apixaban  (ELIQUIS ) 5 MG TABS tablet Take 1 tablet (5 mg total) by mouth 2 (two) times daily. 60 tablet 3   Blood Pressure Monitor DEVI Use as directed to check home blood pressure 2-3 times a week 1 each 0   carvedilol  (COREG ) 3.125 MG tablet Take 1 tablet (3.125 mg total) by mouth 2 (two) times daily. 180 tablet 3   cetirizine  (ZYRTEC ) 5 MG tablet Take 1 tablet (5 mg total) by mouth daily. 90 tablet 0   docusate sodium  (COLACE) 100 MG capsule Take 1 capsule (100 mg total) by mouth daily as needed for mild constipation. 90 capsule 0   empagliflozin  (JARDIANCE ) 10 MG TABS tablet Take 1 tablet (10 mg total) by mouth daily. 30 tablet 5   furosemide  (LASIX ) 20 MG tablet Take 1 tablet (20 mg total) by mouth as needed. For weight gain of 3lb in 24 hours or 5 lb in a week     Incontinence Supplies (MALE URINAL) MISC 1 each by Does not apply route daily as needed. 1 each 2   Incontinence Supply Disposable ([employer] DISPOSABLE BED MATS) MISC 1 each by Does not apply route daily as needed. 9 each  11   Incontinence Supply Disposable (PROCARE ADULT BRIEFS LARGE) MISC 1 each by Does not apply route daily as needed. 25 each 11   nicotine  polacrilex (NICORETTE ) 2 MG gum Take 1 each (2 mg total) by mouth as needed for smoking cessation. 110 tablet 0   oxyCODONE -acetaminophen  (PERCOCET) 5-325 MG tablet Take 1 tablet by mouth every 6 (six) hours as needed. 15 tablet 0   polyethylene glycol (MIRALAX  / GLYCOLAX ) 17 g  packet Take 17 g by mouth daily as needed. 100 each 0   rosuvastatin  (CRESTOR ) 10 MG tablet Take 1 tablet (10 mg total) by mouth daily. 90 tablet 3   sacubitril -valsartan  (ENTRESTO ) 49-51 MG Take 1 tablet by mouth 2 (two) times daily. 60 tablet 11   spironolactone  (ALDACTONE ) 25 MG tablet Take 0.5 tablets (12.5 mg total) by mouth daily. 15 tablet 2   triamcinolone  (KENALOG ) 0.025 % cream Apply 1 Application topically 2 (two) times daily. 454 g 2   No current facility-administered medications on file prior to visit.    Allergies  Allergen Reactions   Shellfish Allergy Anaphylaxis   Peanut (Diagnostic) Itching    Social History   Socioeconomic History   Marital status: Significant Other    Spouse name: Not on file   Number of children: 7   Years of education: Not on file   Highest education level: 9th grade  Occupational History   Occupation: unemployed    Comment: experiencing homelessness-living at daily rent motels.  Tobacco Use   Smoking status: Every Day    Current packs/day: 1.00    Average packs/day: 1 pack/day for 41.0 years (41.0 ttl pk-yrs)    Types: Cigarettes    Passive exposure: Current   Smokeless tobacco: Never  Vaping Use   Vaping status: Never Used  Substance and Sexual Activity   Alcohol  use: Yes    Alcohol /week: 63.0 standard drinks of alcohol     Types: 63 Cans of beer per week    Comment: 3-40oz beer/day x10 years. 1-2 40oz beer/day in 20s/30s   Drug use: Yes    Types: Crack cocaine , Cocaine     Comment: last use 01/13/22   Sexual activity: Not Currently  Other Topics Concern   Not on file  Social History Narrative   Not on file   Social Drivers of Health   Financial Resource Strain: Medium Risk (02/03/2024)   Overall Financial Resource Strain (CARDIA)    Difficulty of Paying Living Expenses: Somewhat hard  Food Insecurity: Food Insecurity Present (09/04/2023)   Hunger Vital Sign    Worried About Running Out of Food in the Last Year: Sometimes  true    Ran Out of Food in the Last Year: Sometimes true  Transportation Needs: Unmet Transportation Needs (09/04/2023)   PRAPARE - Transportation    Lack of Transportation (Medical): Yes    Lack of Transportation (Non-Medical): Yes  Physical Activity: Not on file  Stress: No Stress Concern Present (02/03/2024)   [employer] of Occupational Health - Occupational Stress Questionnaire    Feeling of Stress: Not at all  Social Connections: Moderately Integrated (02/03/2024)   Social Connection and Isolation Panel    Frequency of Communication with Friends and Family: More than three times a week    Frequency of Social Gatherings with Friends and Family: More than three times a week    Attends Religious Services: More than 4 times per year    Active Member of [employer] or Organizations: No    Attends Banker  Meetings: Never    Marital Status: Living with partner  Intimate Partner Violence: Not At Risk (09/04/2023)   Humiliation, Afraid, Rape, and Kick questionnaire    Fear of Current or Ex-Partner: No    Emotionally Abused: No    Physically Abused: No    Sexually Abused: No    Family History  Problem Relation Age of Onset   Diabetes Mother     Past Surgical History:  Procedure Laterality Date   BRONCHIAL NEEDLE ASPIRATION BIOPSY  07/03/2023   Procedure: BRONCHIAL NEEDLE ASPIRATION BIOPSIES;  Surgeon: Claudene Toribio BROCKS, MD;  Location: [facility] ENDOSCOPY;  Service: Pulmonary;;   BRONCHIAL WASHINGS  07/03/2023   Procedure: BRONCHIAL WASHINGS;  Surgeon: Claudene Toribio BROCKS, MD;  Location: [facility] ENDOSCOPY;  Service: Pulmonary;;   BUBBLE STUDY  09/11/2021   Procedure: BUBBLE STUDY;  Surgeon: Okey Vina GAILS, MD;  Location[facility] ENDOSCOPY;  Service: Cardiovascular;;   LEFT HEART CATH AND CORONARY ANGIOGRAPHY N/A 12/19/2021   Procedure: LEFT HEART CATH AND CORONARY ANGIOGRAPHY;  Surgeon: Claudene Victory ORN, MD;  Location: [facility] INVASIVE CV LAB;  Service: Cardiovascular;  Laterality: N/A;   RIGHT/LEFT  HEART CATH AND CORONARY ANGIOGRAPHY N/A 04/13/2022   Procedure: RIGHT/LEFT HEART CATH AND CORONARY ANGIOGRAPHY;  Surgeon: Rolan Ezra RAMAN, MD;  Location: [facility] INVASIVE CV LAB;  Service: Cardiovascular;  Laterality: N/A;   TEE WITHOUT CARDIOVERSION N/A 09/11/2021   Procedure: TRANSESOPHAGEAL ECHOCARDIOGRAM (TEE);  Surgeon: Okey Vina GAILS, MD;  Location: [facility] ENDOSCOPY;  Service: Cardiovascular;  Laterality: N/A;   VIDEO BRONCHOSCOPY WITH ENDOBRONCHIAL ULTRASOUND N/A 07/03/2023   Procedure: VIDEO BRONCHOSCOPY WITH ENDOBRONCHIAL ULTRASOUND;  Surgeon: Claudene Toribio BROCKS, MD;  Location: [facility] ENDOSCOPY;  Service: Pulmonary;  Laterality: N/A;    ROS: Review of Systems Negative except as stated above  PHYSICAL EXAM: BP 138/84   Pulse 79   Temp (!) 97.5 F (36.4 C) (Oral)   Resp 16   SpO2 95%   Physical Exam HENT:     Head: Normocephalic and atraumatic.     Nose: Nose normal.     Mouth/Throat:     Mouth: Mucous membranes are moist.     Pharynx: Oropharynx is clear.  Eyes:     Extraocular Movements: Extraocular movements intact.     Conjunctiva/sclera: Conjunctivae normal.     Pupils: Pupils are equal, round, and reactive to light.  Cardiovascular:     Rate and Rhythm: Normal rate and regular rhythm.     Pulses: Normal pulses.     Heart sounds: Normal heart sounds.  Pulmonary:     Effort: Pulmonary effort is normal.     Breath sounds: Normal breath sounds.  Musculoskeletal:        General: Normal range of motion.     Cervical back: Normal range of motion and neck supple.  Neurological:     General: No focal deficit present.     Mental Status: He is alert and oriented to person, place, and time.  Psychiatric:        Mood and Affect: Mood normal.        Behavior: Behavior normal.     ASSESSMENT AND PLAN: 1. Encounter for completion of form with patient (Primary) - [facility] FL2 Form to be completed by our office RN case manager. Message sent to the same.  2. Screening PSA  (prostate specific antigen) - Routine screening.  - PSA  3. Immunization due - Administered. - Flu vaccine trivalent PF, 6mos and older(Flulaval,Afluria,Fluarix,Fluzone)   Patient was given  the opportunity to ask questions.  Patient verbalized understanding of the plan and was able to repeat key elements of the plan. Patient was given clear instructions to go to Emergency Department or return to medical center if symptoms don't improve, worsen, or new problems develop.The patient verbalized understanding.   Orders Placed This Encounter  Procedures   Flu vaccine trivalent PF, 6mos and older(Flulaval,Afluria,Fluarix,Fluzone)   PSA    Return for Follow-up as needed.  Greig JINNY Drones, NP

## 2024-05-09 LAB — PSA: Prostate Specific Ag, Serum: 0.2 ng/mL (ref 0.0–4.0)

## 2024-05-11 ENCOUNTER — Ambulatory Visit: Payer: MEDICAID | Admitting: Nurse Practitioner

## 2024-05-11 ENCOUNTER — Ambulatory Visit: Payer: Self-pay | Admitting: Family

## 2024-05-11 NOTE — Telephone Encounter (Signed)
 I emailed Slater the form on the day of appointment

## 2024-05-11 NOTE — Telephone Encounter (Signed)
 We received FL2 and emailed it to [place] the day of appointment

## 2024-05-12 ENCOUNTER — Telehealth: Payer: Self-pay | Admitting: Family

## 2024-05-12 NOTE — Telephone Encounter (Signed)
 Copied from CRM [id number]. Topic: General - Other >> May 12, 2024  3:28 PM Dedra B wrote: Reason for CRM: Pt called to get a status update on FL2 forms that were dropped off last week. Pls call him at [phone] with an update.

## 2024-05-13 ENCOUNTER — Telehealth: Payer: Self-pay | Admitting: Emergency Medicine

## 2024-05-13 NOTE — Telephone Encounter (Signed)
 Form fax to [facility] by CMA . Slater is out of town and will look at it upon returning.

## 2024-05-13 NOTE — Telephone Encounter (Signed)
 Therisa (girlfriend) called for status update  Best contact: [phone]

## 2024-05-13 NOTE — Telephone Encounter (Signed)
 I called and spoke with him and then I spoke with his case manager about FL-2 form that needs to be filed out she stated she will fill it out and bring it by tomorrow to be signed by the PCP.  Patient if it does not get completed he will get put out.

## 2024-05-14 ENCOUNTER — Telehealth: Payer: Self-pay | Admitting: Emergency Medicine

## 2024-05-14 ENCOUNTER — Other Ambulatory Visit (HOSPITAL_COMMUNITY): Payer: Self-pay

## 2024-05-14 ENCOUNTER — Telehealth: Payer: Self-pay | Admitting: Family

## 2024-05-14 NOTE — Telephone Encounter (Signed)
 Faxed back completed form that I received today on today

## 2024-05-14 NOTE — Telephone Encounter (Unsigned)
 Copied from CRM [id number]. Topic: General - Other >> May 13, 2024  1:27 PM Fonda T wrote: Reason for CRM: Received call from case manager, Luciana, calling along with patient to get status update of FL2 form that was dropped off to office last week.  Informed all forms has a turn around time of completion of 7-14 days.   Per caller, needs form returned as soon as possible, due to housing crisis.  Called and spoke to office, also advised of same turn around time, and that the office would contact patient when form is completed.   Luciana would like to have a follow up call as soon as possible due to urgency of completion of form, and housing crisis for patient.  Case worker Can be reached at [phone], or can email form to [email or web address]  Thank you >> May 14, 2024  9:42 AM Lonell PEDLAR wrote: Case Manager, Luciana called again stating that she has completed the form and will be faxing it over this morning. She is just requesting a signature from the provider and would like to pick up the form today.

## 2024-05-14 NOTE — Telephone Encounter (Signed)
 Faxed completed form back on 05/14/2024 that I received on 05/14/2024

## 2024-05-14 NOTE — Telephone Encounter (Signed)
 A document form has been faxed: FL2, to be filled out by provider. Send document back via Fax within 7-days to 14 days. Document is located in providers tray at front office.

## 2024-05-14 NOTE — Telephone Encounter (Signed)
 Copied from CRM [id number]. Topic: Clinical - Prescription Issue >> May 14, 2024 10:19 AM Avram MATSU wrote: Reason for CRM: patient stated the pharmacy will charge for Blood Pressure Monitor DEVI [[id number]] it is not covered by insurance if its been sent to the pharmacy. It need to be sent to a medical supply store to be cover. Pt does not know of any medical supply store. Possible a store on [address] in [place]. Please advise

## 2024-05-14 NOTE — Telephone Encounter (Signed)
 Copied from CRM [id number]. Topic: Clinical - Order For Equipment >> May 14, 2024 10:28 AM Berwyn MATSU wrote: Reason for CRM: Patient and Patient called in regarding orders for DME.for Rollator walker with seat and blood pressure monitor. Patient is requesting for these orders to be sent to :  [facility] Ph: [phone] Fax: [phone]

## 2024-05-14 NOTE — Telephone Encounter (Signed)
 Copied from CRM [id number]. Topic: Clinical - Order For Equipment >> May 14, 2024 10:22 AM Craig Schmidt wrote: Reason for CRM: Patient is requesting a new walker to be sent over to a medical supply store.

## 2024-05-14 NOTE — Telephone Encounter (Signed)
 Slater is out of the office. If you could please email me a copy of what you have emailed to Slater I will completed . Thank you.

## 2024-05-15 ENCOUNTER — Other Ambulatory Visit: Payer: Self-pay | Admitting: Pharmacist

## 2024-05-15 ENCOUNTER — Other Ambulatory Visit: Payer: Self-pay | Admitting: Family

## 2024-05-15 ENCOUNTER — Ambulatory Visit: Payer: MEDICAID

## 2024-05-15 DIAGNOSIS — R262 Difficulty in walking, not elsewhere classified: Secondary | ICD-10-CM

## 2024-05-15 DIAGNOSIS — R2681 Unsteadiness on feet: Secondary | ICD-10-CM | POA: Diagnosis not present

## 2024-05-15 DIAGNOSIS — I10 Essential (primary) hypertension: Secondary | ICD-10-CM

## 2024-05-15 DIAGNOSIS — R293 Abnormal posture: Secondary | ICD-10-CM

## 2024-05-15 DIAGNOSIS — M6281 Muscle weakness (generalized): Secondary | ICD-10-CM

## 2024-05-15 MED ORDER — BLOOD PRESSURE MONITOR DEVI: 0 refills | Status: DC

## 2024-05-15 NOTE — Telephone Encounter (Signed)
 Thank you :)

## 2024-05-15 NOTE — Telephone Encounter (Signed)
 Complete

## 2024-05-15 NOTE — Telephone Encounter (Signed)
 Herlene if you will please assist. Thank you.

## 2024-05-15 NOTE — Therapy (Signed)
 OUTPATIENT PHYSICAL THERAPY WHEELCHAIR EVALUATION   Patient Name: Craig Schmidt MRN: [id number] DOB:[birth year], 53 y.o., male Today's Date: 05/15/2024  END OF SESSION:  PT End of Session - 05/15/24 1231     Visit Number 1    Number of Visits 1    Authorization Type [employer]    PT Start Time 1230    PT Stop Time 1315    PT Time Calculation (min) 45 min    Equipment Utilized During Treatment Gait belt    Activity Tolerance Patient tolerated treatment well    Behavior During Therapy WFL for tasks assessed/performed          Past Medical History:  Diagnosis Date   Alcohol  abuse    Asthma    Brain tumor (HCC)    CAD (coronary artery disease)    Chronic HFrEF (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)    Chronic kidney disease, stage 3 (HCC)    Cocaine  abuse (HCC)    History of medication noncompliance    Homelessness    MI (myocardial infarction) (HCC)    STEMI (ST elevation myocardial infarction) (HCC)    Stroke [place])    Past Surgical History:  Procedure Laterality Date   BRONCHIAL NEEDLE ASPIRATION BIOPSY  07/03/2023   Procedure: BRONCHIAL NEEDLE ASPIRATION BIOPSIES;  Surgeon: Claudene Toribio BROCKS, MD;  Location: [facility] ENDOSCOPY;  Service: Pulmonary;;   BRONCHIAL WASHINGS  07/03/2023   Procedure: BRONCHIAL WASHINGS;  Surgeon: Claudene Toribio BROCKS, MD;  Location: [facility] ENDOSCOPY;  Service: Pulmonary;;   BUBBLE STUDY  09/11/2021   Procedure: BUBBLE STUDY;  Surgeon: Okey Vina GAILS, MD;  Location: [facility] ENDOSCOPY;  Service: Cardiovascular;;   LEFT HEART CATH AND CORONARY ANGIOGRAPHY N/A 12/19/2021   Procedure: LEFT HEART CATH AND CORONARY ANGIOGRAPHY;  Surgeon: Claudene Victory ORN, MD;  Location: [facility] INVASIVE CV LAB;  Service: Cardiovascular;  Laterality: N/A;   RIGHT/LEFT HEART CATH AND CORONARY ANGIOGRAPHY N/A 04/13/2022   Procedure: RIGHT/LEFT HEART CATH AND CORONARY ANGIOGRAPHY;  Surgeon: Rolan Ezra RAMAN, MD;  Location: [facility] INVASIVE CV LAB;  Service: Cardiovascular;   Laterality: N/A;   TEE WITHOUT CARDIOVERSION N/A 09/11/2021   Procedure: TRANSESOPHAGEAL ECHOCARDIOGRAM (TEE);  Surgeon: Okey Vina GAILS, MD;  Location: [facility] ENDOSCOPY;  Service: Cardiovascular;  Laterality: N/A;   VIDEO BRONCHOSCOPY WITH ENDOBRONCHIAL ULTRASOUND N/A 07/03/2023   Procedure: VIDEO BRONCHOSCOPY WITH ENDOBRONCHIAL ULTRASOUND;  Surgeon: Claudene Toribio BROCKS, MD;  Location: [facility] ENDOSCOPY;  Service: Pulmonary;  Laterality: N/A;   Patient Active Problem List   Diagnosis Date Noted   Hypertension, malignant 09/04/2023   HFrEF (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) 09/04/2023   Coronary artery disease 06/27/2023   History of CVA (cerebrovascular accident) 06/27/2023   Substance use disorder 06/27/2023   Atypical chest pain 06/21/2023   Pulmonary infiltrate 06/21/2023   Multifocal pneumonia 06/11/2023   Hyperkalemia 06/11/2023   Hyponatremia 06/11/2023   Normocytic anemia 06/11/2023   Thrombocytosis 06/11/2023   Troponin I above reference range 05/25/2023   CAP (community acquired pneumonia) 05/25/2023   Pneumonia 05/25/2023   Chronic combined systolic and diastolic CHF (congestive heart failure) (HCC) 12/03/2022   Malnutrition of moderate degree 11/30/2022   Acute on chronic combined systolic and diastolic CHF (congestive heart failure) (HCC) 11/29/2022   Acute on chronic combined systolic (congestive) and diastolic (congestive) heart failure (HCC) 11/28/2022   ACS (acute coronary syndrome) (HCC) 11/28/2022   Back pain 11/28/2022   Essential hypertension 11/28/2022   Hyperlipidemia 11/28/2022   Noncompliance with medication  regimen 11/28/2022   ST elevation myocardial infarction (STEMI) of inferolateral wall, subsequent episode of care (HCC) 12/19/2021   ST elevation myocardial infarction (STEMI) (HCC)    Ischemic cardiomyopathy    Cocaine  abuse (HCC)    Smoker    Alcohol  abuse    Acute CVA (cerebrovascular accident) (HCC) 09/08/2021   Unilateral vestibular  schwannoma (HCC) 05/20/2015   Tear of MCL (medial collateral ligament) of knee 05/20/2015   Protein-calorie malnutrition, severe 05/17/2015   Lactic acidosis 05/15/2015   Left knee pain 05/15/2015   Lung nodules 05/15/2015   Hypoglycemia 05/14/2015   Hypothermia 05/14/2015   Acute encephalopathy 05/14/2015   Cocaine  abuse with intoxication (HCC) 05/14/2015   Alcohol  intoxication in active alcoholic 05/14/2015   Sepsis (HCC) 05/14/2015   Homeless 05/14/2015    PCP: Greig Drones, NP  REFERRING PROVIDER: Greig Drones, NP   THERAPY DIAG:  Unsteadiness on feet  Abnormal posture  Muscle weakness (generalized)  Difficulty in walking, not elsewhere classified  Rationale for Evaluation and Treatment Rehabilitation  SUBJECTIVE:                                                                                                                                                                                           SUBJECTIVE STATEMENT: Pt presents for wheelchair evaluation. He is currently in a standard wheelchair that does not have a tread on the L wheel and the arm rests come up to ~3in below his acromion process. This wc was given to him by a pastor. Patient's left leg rest is also broken. Pt reports first stroke was in 2023 and second stroke was in 2025. Bil strokes affected both of his sides including bil UE and LE rendering patient unable to walk. Pt is able to take 1-2 steps with min to mod A to transfer from chair to couch or bed. Pt reports multiple near falls with transfers. Pt lives in an apartment with girlfriend/caregiver.  PRECAUTIONS: Fall   WEIGHT BEARING RESTRICTIONS No  PLOF:  Needs assistance with ADLs and Needs assistance with transfers  PATIENT GOALS: to obtain a powerchair     MEDICAL HISTORY:  Primary diagnosis onset: 09/08/2021     Medical Diagnosis with ICD-10 code: Acute CVA- I63.x   [] Progressive disease  Relevant future surgeries:     Height:  5'7   Weight: 165 lbs Explain recent changes or trends in weight:      History:  Past Medical History:  Diagnosis Date   Alcohol  abuse    Asthma    Brain tumor (HCC)    CAD (coronary artery disease)    Chronic HFrEF (heart failure with  reduced ejection fraction) (HCC)    Chronic kidney disease, stage 3 (HCC)    Cocaine  abuse (HCC)    History of medication noncompliance    Homelessness    MI (myocardial infarction) (HCC)    STEMI (ST elevation myocardial infarction) (HCC)    Stroke (HCC)        Cardio Status:  Functional Limitations:   [] Intact  [x]  Impaired    h/o STEMI  Respiratory Status:  Functional Limitations:   [] Intact  [] Impaired   [x] SOB [] COPD [] O2 Dependent ______LPM  [] Ventilator Dependent  Resp equip:                                                     Objective Measure(s): has asthma  Orthotics:   [] Amputee:                                                             [] Prosthesis:     HOME ENVIRONMENT:  [] House [] Condo/town home [x] Apartment [] Asst living [] LTCF         [] Own  [x] Rent   [] Lives alone [x] Lives with others -        significant other                     Hours without assistance: 0  [] Home is accessible to patient              Storage of wheelchair:  [x] In home   [] Other Comments:        COMMUNITY :  TRANSPORTATION:  [] Car [] Estate agent [] Adapted w/c Lift []  Ambulance [] Other:                     [x] Sits in wheelchair during transport   Where is w/c stored during transport?  [x] [employer]  []  [employer]  r   [] Self-Driver       Drive while in  Biomedical scientist [] yes [x] no   Employment and/or school:  Specific requirements pertaining to mobility        Other:  COMMUNICATION:  Verbal Communication  [x] WFL [] receptive [x] WFL [] expressive [] Understandable  [] Difficult to understand  [] non-communicative  Primary Language:____english__________ 2nd:_____________  Communication provided by:[x] Patient [x] Family [] Caregiver [] Translator   [] Uses  an augmentative communication device     Manufacturer/Model :    MOBILITY/BALANCE:  Sitting Balance  Standing Balance  Transfers  Ambulation   [] WFL      [] WFL  [] Independent  []  Independent   [x] Uses UE for balance in sitting Comments:  [] Uses UE/device for stability Comments:  []  Min assist  []  Ambulates independently with       device:___________________      [x]  Mod assist  []  Able to ambulate ______ feet        safely/functionally/independently   []  Min assist  []  Min assist  []  Max assist  [x]  Non-functional ambulator         History/High risk of falls   []  Mod assist  []  Mod assist  []  Dependent  []  Unable to ambulate   []  Max  assist  [x]  Max assist  Transfer method:[x] 1 person [] 2 person []   sliding board [] squat pivot [x] stand pivot [] mechanical patient lift  [] other:   []  Unable  []  Unable    Fall History: # of falls in the past 6 months? 0 # of "near" falls in the past 6 months? Multiple; mainly when walking even with assist    CURRENT SEATING / MOBILITY:  Current Mobility Device: [] None [] Cane/Walker [x] Manual [] Dependent [] Dependent w/ Tilt rScooter  [] Power (type of control):   Manufacturer:  Model:  Serial #:   Size:  Color:  Age:   Purchased by whom: donated by pastor  Current condition of mobility base:    Current seating system:                                                                       Age of seating system:    Describe posture in present seating system: left tire has come off, pt is moving about without a wheel, Left leg rest is broken, broken arm pads, no cushion   Is the current mobility meeting medical necessity?:  [] Yes [x] No Describe: see above    Ability to complete Mobility-Related Activities of Daily Living (MRADL's) with Current Mobility Device:   Move room to room  [] Independent  [] Min [] Mod [] Max assist  [x] Unable  Comments:   Meal prep  [] Independent  [] Min [] Mod [x] Max assist  [] Unable    Feeding  [] Independent  [x] Min [] Mod [] Max assist   [] Unable    Bathing  [] Independent  [] Min [] Mod [x] Max assist  [] Unable    Grooming  [] Independent  [] Min [] Mod [x] Max assist  [] Unable    UE dressing  [] Independent  [] Min [] Mod [x] Max assist  [] Unable    LE dressing  [] Independent   [] Min [] Mod [x] Max assist  [] Unable    Toileting  [] Independent  [] Min [] Mod [x] Max assist  [] Unable    Bowel Mgt: []  Continent []  Incontinent [x]  Accidents [x]  Diapers []  Colostomy []  Bowel Program:  Bladder Mgt: []  Continent []  Incontinent [x]  Accidents [x]  Diapers []  Urinal []  Intermittent Cath []  Indwelling Cath []  Supra-pubic Cath     Current Mobility Equipment Trialed/ Ruled Out:    Does not meet mobility needs due to:    Mark all boxes that indicate inability to use the specific equipment listed     Meets needs for safe  independent functional  ambulation  / mobility    Risk of  Falling or History of Falls    Enviromental limitations      Cognition    Safety concerns with  physical ability    Decreased / limitations endurance  & strength     Decreased / limitations  motor skills  & coordination    Pain    Pace /  Speed    Cardiac and/or  respiratory condition    Contra - indicated by diagnosis   Cane/Crutches  []   [x]   []   []   [x]   [x]   [x]   []   [x]   [x]   []    Walker / Rollator  []  NA   []   [x]   []   []   [x]   [x]   [x]   []   [x]   [x]   []     Manual Wheelchair [id number]:  []  NA  []   []   []   []   [  x]  [x]   [x]   []   [x]   [x]   []    Manual W/C (K0005) with power assist  [x]  NA  []   []   []   []   []   []   []   []   []   []   []    Scooter  [x]  NA  []   []   []   []   []   []   []   []   []   []   []    Power Wheelchair: standard joystick  []  NA  [x]   []   []   []   []   []   []   []   []   []   []    Power Wheelchair: alternative controls  []  NA  []   []   []   []   []   []   []   []   []   []   []    Summary:  The least costly alternative for independent functional mobility was found to be:    []  Crutch/Cane  []  Walker []  Manual w/c  []  Manual w/c with power assist   []  Scooter    [x]  Power w/c std joystick   []  Power w/c alternative control        []  Requires dependent care mobility device   Cabin crew for [employer] skills are adequate for safe mobility equipment operation  [x]   Yes []   No  Patient is willing and motivated to use recommended mobility equipment  [x]   Yes []   No       []  Patient is unable to safely operate mobility equipment independently and requires dependent care equipment Comments:           SENSATION and SKIN ISSUES:  Sensation [x]  Intact  []  Impaired []  Absent []  Hyposensate []  Hypersensate  []  Defensiveness  Location(s) of impairment:    Pressure Relief Method(s):  []  Lean side to side to offload (without risk of falling)  []   W/C push up (4+ times/hour for 15+ seconds) []  Stand up (without risk of falling)    []  Other: (Describe): Effective pressure relief method(s) above can be performed consistently throughout the day: [] Yes  [x]  No If not, Why?: Patient lacks adequate UE strength to complete a sufficient wheelchair push up; he is unsafe to stand without significant assist to complete a full pressure relief , he is unable to perform lateral shifts due to spasms and tone in bil UE and bil shoulder pain  Skin Integrity Risk:       []  Low risk           []  Moderate risk            [x]  High risk  If high risk, explain: He is unable to complete any pressure relief techniques (lean side to side, wc push up or standing) safely and without significant risk for falling. He experiences frequent bowel/bladder accidents due to poor mobility increasing his risk for skin breakdown as well.   Skin Issues/Skin Integrity  Current skin Issues  []  Yes [x]  No []  Intact  []   Red area   []   Open area  []  Scar tissue  [x]  At risk from prolonged sitting  Where: sacrum, coccyx, ischial tuberosities  History of Skin Issues  []  Yes [x]  No Where : When: Stage: Hx of skin flap surgeries  []  Yes [x]  No Where:  When:  Pain:  [x]  Yes []  No   Pain Location(s): bil shoulders, intermittently Intensity scale: (0-10) : 5/10 How does pain interfere with mobility and/or MRADLs? - difficulty with transfers, weight shifts, ADLs  MAT EVALUATION:  Neuro-Muscular Status: (Tone, Reflexive, Responses, etc.)     []   Intact   [x]  Spasticity:  [x]  Hypotonicity  [x]  Fluctuating  []  Muscle Spasms  [x]  Poor Righting Reactions/Poor Equilibrium Reactions  []  Primal Reflex(s):    Comments:            COMMENTS:    POSTURE:     Comments:  Pelvis Anterior/Posterior:  []  Neutral   [x]  Posterior  []  Anterior  []  Fixed - No movement [x]  Tendency away from neutral [x]  Flexible []  Self-correction [x]  External correction Obliquity (viewed from front)  [x]  WFL []  R Obliquity []  L Obliquity  []  Fixed - No movement []  Tendency away from neutral []  Flexible []  Self-correction []  External correction Rotation  [x]  WFL []  R anterior []  L anterior  []  Fixed - No movement []  Tendency away from neutral []  Flexible []  Self-correction []  External correction Tonal Influence Pelvis:  []  Normal []  Flaccid [x]  Low tone []  Spasticity []  Dystonia []  Pelvis thrust []  Other:    Trunk Anterior/Posterior:  []  WFL [x]  Thoracic kyphosis []  Lumbar lordosis  []  Fixed - No movement [x]  Tendency away from neutral [x]  Flexible []  Self-correction [x]  External correction  [x]  WFL []  Convex to left  []  Convex to right []  S-curve   []  C-curve []  Multiple curves []  Tendency away from neutral []  Flexible []  Self-correction []  External correction Rotation of shoulders and upper trunk:  [x]  Neutral []  Left-anterior []  Right- anterior []  Fixed- no movement []  Tendency away from neutral []  Flexible []  Self correction []  External correction Tonal influence Trunk:  []  Normal []  Flaccid [x]  Low tone []  Spasticity []  Dystonia []  Other:   Head & Neck  []  Functional [x]  Flexed    []  Extended []  Rotated right   []  Rotated left []  Laterally flexed right []  Laterally flexed left []  Cervical hyperextension   []  Good head control [x]  Adequate head control []  Limited head control []  Absent head control Describe tone/movement of head and neck:    Lower Extremity Measurements: LE MMT:  MMT Right 05/15/2024 Left 05/15/2024  Hip flexion 3 3+  Hip extension    Hip abduction 3+ 3+  Hip adduction 3+ 3+  Knee flexion 3+ 3+  Knee extension 3+ 3+  Ankle dorsiflexion    Ankle plantarflexion     (Blank rows = not tested)  Hip positions:  [x]  Neutral   []  Abducted   []  Adducted  []  Subluxed   []  Dislocated   []  Fixed   []  Tendency away from neutral []  Flexible []  Self-correction []  External correction   Hip Windswept:[x]  Neutral  []  Right    []  Left  []  Subluxed   []  Dislocated   []  Fixed   []  Tendency away from neutral []  Flexible []  Self-correction []  External correction  LE Tone: []  Normal []  Low tone []  Spasticity []  Flaccid [x]  Dystonia []  Rocks/Extends at hip []  Thrust into knee extension []  Pushes legs downward into footrest  Foot positioning: ROM Concerns: Dorsiflexed: []  Right   []  Left Plantar flexed: []  Right    []  Left Inversion: []  Right    []  Left Eversion: [x]  Right    []  Left  LE Edema: []  1+ (Barely detectable impression when finger is pressed into skin) [x]  2+ (slight indentation. 15 seconds to rebound) []  3+ (deeper indentation. 30 seconds to rebound) []  4+ (>30 seconds to rebound)  UE Measurements:   UPPER EXTREMITY MMT:  MMT Right 05/15/2024 Left  05/15/2024  Shoulder flexion 2- 2-  Shoulder abduction 2 2+  Shoulder adduction    Elbow flexion 3 3  Elbow extension 3 3  Wrist flexion    Wrist extension    Pinch strength    Grip strength ~2lbs ~4lbs  (Blank rows = not tested)  Shoulder Posture:  Right Tendency towards Left  []   Functional []    []   Elevation []    [x]   Depression [x]    [x]   Protraction [x]    []   Retraction []    [x]    Internal rotation [x]    []   External rotation []    []   Subluxed []     UE Tone: []  Normal []  Flaccid []  Low tone [x]  Spasticity  [x]  Dystonia []  Other:   Wrist/Hand: Handedness: [x]  Right   []  Left   []  NA: Comments:  Right  Left  []   WNL []    [x]   Limitations [x]    [x]   Contractures []    []   Fisting []    [x]   Tremors [x]    [x]   Weak grasp [x]    [x]   Poor dexterity [x]    [x]   Hand movement non functional []    []   Paralysis []     MOBILITY BASE RECOMMENDATIONS and JUSTIFICATION:  MOBILITY BASE  JUSTIFICATION   Manufacturer:   [id number] Model:                              Edge Color:  Seat Width:  16 Seat Depth 18   []  Manual mobility base (continue below)   []  Scooter/POV  [x]  Power mobility base   Number of hours per day spent in above selected mobility base: ~12 hrs  Typical daily mobility base use Schedule: transfers into wc with assistance to participate in daily activities   [x]  is not a safe, functional ambulator  [x]  limitation prevents from completing a MRADL(s) within a reasonable time frame    []  limitation places at high risk of morbidity or mortality secondary to  the attempts to perform a    MRADL(s)  [x]  limitation prevents accomplishing a MRADL(s) entirely  [x]  provide independent mobility  [x]  equipment is a lifetime medical need  [x]  walker or cane inadequate  [x]  any type manual wheelchair      inadequate  [x]  scooter/POV inadequate      []  requires dependent mobility         POWER MOBILITY      []  Scooter/POV    []  can safely operate   []  can safely transfer   []  has adequate trunk stability   []  cannot functionally propel  manual wheelchair    [x]  Power mobility base    [x]  non-ambulatory   [x]  cannot functionally propel manual wheelchair   [x]  cannot functionally and safely      operate scooter/POV  [x]  can safely operate power       wheelchair  [x]  home is accessible  [x]  willing to use power wheelchair     Tilt  [x]  Powered tilt on  powered chair  []  Powered tilt on manual chair  []  Manual tilt on manual chair Comments:  [x]  change position for pressure      [x]  elief/cannot weight shift   [x]  change position against      gravitational force on head and      shoulders   []  decrease pain  []  blood pressure management   []  control autonomic dysreflexia  [  x] decrease respiratory distress  [x]  management of spasticity  []  management of low tone  [x]  facilitate postural control   [x]  rest periods   [x]  control edema  [x]  increase sitting tolerance   [x]  aid with transfers     Recline   []  Power recline on power chair  []  Manual recline on manual chair  Comments:    []  intermittent catheterization  []  manage spasticity  []  accommodate femur to back angle  []  change position for pressure relief/cannot weight shift rhigh risk of pressure sore development  []  tilt alone does not accomplish     effective pressure relief, maximum pressure relief achieved at -      _______ degrees tilt   _______ degrees recline   []  difficult to transfer to and from bed []  rest periods and sleeping in chair  []  repositioning for transfers  []  bring to full recline for ADL care  []  clothing/diaper changes in chair  []  gravity PEG tube feeding  []  head positioning  []  decrease pain  []  blood pressure management   []  control autonomic dysreflexia  []  decrease respiratory distress  []  user on ventilator     Elevator on mobility base  [x]  Power wheelchair  []  Scooter  [x]  increase Indep in transfers   [x]  increase Indep in ADLs    [x]  bathroom function and safety  []  kitchen/cooking function and safety  []  shopping  []  raise height for communication at standing level  [x]  raise height for eye contact which reduces cervical neck strain and pain  []  drive at raised height for safety and navigating crowds  []  Other:   []  Vertical position system  (anterior tilt)     (Drive locks-out)    []  Stand       (Drive enabled)  []   independent weight bearing  []  decrease joint contractures  []  decrease/manage spasticity  []  decrease/manage spasms  []  pressure distribution away from   scapula, sacrum, coccyx, and ischial tuberosity  []  increase digestion and elimination   []  access to counters and cabinets  []  increase reach  []  increase interaction with others at eye level, reduces neck strain  []  increase performance of       MRADL(s)      Power elevating legrest    [x]  Center mount (Single) 85-170 degrees       []  Standard (Pair) 100-170 degrees  [x]  position legs at 90 degrees, not available with std power ELR  [x]  center mount tucks into chair to decrease turning radius in home, not available with std power ELR  [x]  provide change in position for LE  []  elevate legs during recline    [x]  maintain placement of feet on      footplate  [x]  decrease edema  [x]  improve circulation  [x]  actuator needed to elevate legrest  [x]  actuator needed to articulate legrest preventing knees from flexing  [x]  Increase ground clearance over      curbs  []   STD (pair) independently                     elevate legrest   POWER WHEELCHAIR CONTROLS      Controls/input device  [x]  Expandable  []  Non-expandable  [x]  Proportional  []  Right Hand [x]  Left Hand  []  Non-proportional/switches/head-array  []  Electrical/proximity         []   Mechanical      Manufacturer:___________________   Type:__medium ball handle______________________ [x]  provides access for  controlling wheelchair  [x]  programming for accurate control  []  progressive disease/changing condition  []  required for alternative drive      controls       []  lacks motor control to operate  proportional drive control  []  unable to understand proportional controls  [x]  limited movement/strength  [x]  extraneous movement / tremors / ataxic / spastic       [x]  Upgraded electronics controller/harness    []  Single power (tilt or recline)   []  Expandable    []   Non-expandable plus   [x]  Multi-power (tilt, recline, power legrest, power seat lift, vertical positioning system, stand)  [x]  allows input device to communicate with drive motors  [x]  harness provides necessary connections between the controller, input device, and seat functions     [x]  needed in order to operate power seat functions through joystick/ input device  []  required for alternative drive controls     []  Enhanced display  []  required to connect all alternative drive controls   []  required for upgraded joystick      (lite-throw, heavy duty, micro)  []  Allows user to see in which mode and drive the wheelchair is set; necessary for alternate controls       []  Upgraded tracking electronics  []  correct tracking when on uneven surfaces makes switch driving more efficient and less fatiguing  []  increase safety when driving  []  increase ability to traverse thresholds    []  Safety / reset / mode switches     Type:    []  Used to change modes and stop the wheelchair when driving     [x]  Mount for joystick / input device/switches  [x]  swing away for access or transfers   [x]  attaches joystick / input device / switches to wheelchair   [x]  provides for consistent access  []  midline for optimal placement    [x]  Attendant controlled joystick plus     mount  [x]  safety  [x]  long distance driving  [x]  operation of seat functions  [x]  compliance with transportation regulations    [x]  Battery NF22 x 2 [x]  required to power (power assist / scooter/ power wc / other):   []  Power inverter (24V to 12V)  []  required for ventilator / respiratory equipment / other:     CHAIR OPTIONS MANUAL & POWER      Armrests   [x]  adjustable height []  removable  []  swing away []  fixed  [x]  flip back  []  reclining  [x]  full length pads []  desk []  tube arms []  gel pads  [x]  provide support with elbow at 90    [x]  remove/flip back/swing away for  transfers  [x]  provide support and positioning of upper body    [x]   allow to come closer to table top  []  remove for access to tables  []  provide support for w/c tray  [x]  change of height/angles for variable activities   []  Elbow support / Elbow stop  []  keep elbow positioned on arm pad  []  keep arms from falling off arm pad  during tilt and/or recline   Upper Extremity Support  []  Arm trough  []   R  []   L  Style:  []  swivel mount []  fixed mount   []  posterior hand support  []   tray  []  full tray  []  joystick cut out  []   R  []   L  Style:  []  decrease gravitational pull on      shoulders  []  provide support to  increase UE  function  []  provide hand support in natural    position  []  position flaccid UE  []  decrease subluxation    []  decrease edema       []  manage spasticity   []  provide midline positioning  []  provide work surface  []  placement for AAC/ Computer/ EADL     Hangers/ Legrests   []  ______ degree  []  Elevating []  articulating  []  swing away []  fixed []  lift off  []  heavy duty  []  adjustable knee angle  []  adjustable calf panel   []  longer extension tube              []  provide LE support  []  maintain placement of feet on      footplate   []  accommodate lower leg length  []  accommodate to hamstring       tightness  []  enable transfers  []  provide change in position for LE's  []  elevate legs during recline    []  decrease edema  []  durability      Foot support   [x]  footplate []  R []  L [x]  flip up           [x]  Depth adjustable   [x]  angle adjustable  []  foot board/one piece    [x]  provide foot support  [x]  accommodate to ankle ROM  [x]  allow foot to go under wheelchair base  [x]  enable transfers     []  Shoe holders  []  position foot    []  decrease / manage spasticity  []  control position of LE  []  stability    []  safety     []  Ankle strap/heel      loops  []  support foot on foot support  []  decrease extraneous movement  []  provide input to heel   []  protect foot     []  Amputee adapter []  R  []  L     Style:                   Size:  []  Provide support for stump/residual extremity    []  Transportation tie-down  []  to provide crash tested tie-down brackets    []  Crutch/cane holder    []  O2 holder    []  IV hanger   []  Ventilator tray/mount    []  stabilize accessory on wheelchair       Component  Justification     [x]  Seat cushion    Tru Comfort 2 SPP []  accommodate impaired sensation  []  decubitus ulcers present or history  [x]  unable to shift weight  [x]  increase pressure distribution  []  prevent pelvic extension  [x]  custom required "off-the-shelf"    seat cushion will not accommodate deformity  [x]  stabilize/promote pelvis alignment  [x]  stabilize/promote femur alignment  []  accommodate obliquity  []  accommodate multiple deformity  [x]  incontinent/accidents  [x]  low maintenance     []  seat mounts                 []  fixed []  removable  []  attach seat platform/cushion to wheelchair frame    []  Seat wedge    []  provide increased aggressiveness of seat shape to decrease sliding  down in the seat  []  accommodate ROM        []  Cover replacement   []  protect back or seat cushion  []  incontinent/accidents    []  Solid seat / insert    []  support cushion to prevent      hammocking  []   allows attachment of cushion to mobility base    [x]  Lateral pelvic/thigh/hip     support (Guides)     [x]  decrease abduction  []  accommodate pelvis  []  position upper legs  []  accommodate spasticity  [x]  removable for transfers     [x]  Lateral pelvic/thigh      supports mounts  []  fixed   []  swing-away   [x]  removable  [x]  mounts lateral pelvic/thigh supports     [x]  mounts lateral pelvic/thigh supports swing-away or removable for transfers    []  Medial thigh support (Pommel)  [] decrease adduction  [] accommodate ROM  []  remove for transfers   []  alignment      []  Medial thigh   []  fixed      support mounts      []  swing-away   []  removable  []  mounts medial thigh supports   []  Mounts medial supports swing- away or  removable for transfers      Component  Justification   [x]  Back   Tru Comfort    [x]  provide posterior trunk support []  facilitate tone  [x]  provide lumbar/sacral support []  accommodate deformity  [x]  support trunk in midline   [x]  custom required "off-the-shelf" back support will not accommodate deformity   []  provide lateral trunk support []  accommodate or decrease tone            []  Back mounts  []  fixed  []  removable  []  attach back rest/cushion to wheelchair frame   []  Lateral trunk      supports  []  R []  L  []  decrease lateral trunk leaning  []  accommodate asymmetry    []  contour for increased contact  []  safety    []  control of tone    []  Lateral trunk      supports mounts  []  fixed  []  swing-away   []  removable  []  mounts lateral trunk supports     []  Mounts lateral trunk supports swing-away or removable for transfers   [x]  Anterior chest      strap, vest     [x]  decrease forward movement of shoulder  [x]  decrease forward movement of trunk  [x]  safety/stability  []  added abdominal support  []  trunk alignment  []  assistance with shoulder control   []  decrease shoulder elevation    [x]  Headrest      [x]  provide posterior head support  []  provide posterior neck support  []  provide lateral head support  []  provide anterior head support  [x]  support during tilt and recline  []  improve feeding     []  improve respiration  []  placement of switches  [x]  safety    []  accommodate ROM   []  accommodate tone  []  improve visual orientation   [x]  Headrest           []  fixed [x]  removable []  flip down      Mounting hardware   []  swing-away laterals/switches  []  mount headrest   []  mounts headrest flip down or  removable for transfers  []  mount headrest swing-away laterals   []  mount switches     []  Neck Support    []  decrease neck rotation  []  decrease forward neck flexion   Pelvic Positioner    [x]  std hip belt          []  padded hip belt  []  dual pull hip belt  []  four  point hip belt  []  stabilize tone  [x]  decrease falling out of chair  []  prevent  excessive extension  []  special pull angle to control      rotation  []  pad for protection over boney   prominence  []  promote comfort    []  Essential needs        bag/pouch   []  medicines []  special food rorthotics []  clothing changes  []  diapers  []  catheter/hygiene []  ostomy supplies   The above equipment has a life- long use expectancy.  Growth and changes in medical and/or functional conditions would be the exceptions.   SUMMARY:     ASSESSMENT:  CLINICAL IMPRESSION:  The patient is a 53 year old male who was evaluated for a custom Group 3 power wheelchair. He has a complex medical history involving two significant strokes, which have resulted in bilateral upper and lower extremity impairments. He is not a functional ambulator and requires substantial assistance for basic stand-pivot transfers. Due to significant bilateral upper extremity weakness, poor coordination, and limited endurance, he is unable to propel a manual wheelchair independently. Despite these physical limitations, the patient's vision and cognition remain intact, allowing him to safely operate a power wheelchair and actively participate in mobility-related activities of daily living (MRADLs).  A Group 3 power wheelchair with power tilt, power elevating leg rests, power seat elevation, lateral thigh supports, custom seat cushion and back, and an anterior chest strap is medically necessary to meet the patient's complex mobility, positioning, and safety needs. Power tilt is essential for pressure relief and postural adjustments, which are critical given the patient's high risk for skin integrity breakdown due to prolonged sitting and limited mobility. Power elevating leg rests are required to manage lower extremity edema and improve circulation. Power seat elevation will allow the patient to perform safer transfers, reach surfaces for MRADLs, and  engage socially at eye level, which supports both physical and psychological well-being. Power tilt combined with power elevating leg rest will also assist caregiver with tasks of changing clothes, changing diapers, and hygiene, further reducing caregiver burnout while maintaining patient safety.  Lateral thigh supports are necessary to maintain proper lower extremity alignment and prevent postural collapse due to stroke-related tone and weakness. A custom seat cushion and back are required to accommodate postural asymmetries, provide optimal pressure distribution, and prevent skin breakdown. Pt is at high risk for future skin breakdowns due to inability to perform safe and effective off loading techniques throughout the day without risk of fall.   The anterior chest strap is essential for trunk support and postural stability, preventing forward flexion and promoting safe, upright positioning during mobility and transfers.  This configuration represents the most appropriate and cost-effective solution to support the patient's medical condition, reduce caregiver burden, and enable safe, independent participation in daily life.  OBJECTIVE IMPAIRMENTS Abnormal gait, cardiopulmonary status limiting activity, decreased activity tolerance, decreased balance, decreased coordination, decreased endurance, decreased knowledge of condition, decreased knowledge of use of DME, decreased mobility, and decreased ROM.   ACTIVITY LIMITATIONS bending, standing, squatting, stairs, transfers, bed mobility, continence, bathing, toileting, dressing, reach over head, hygiene/grooming, locomotion level, and caring for others  PARTICIPATION LIMITATIONS: meal prep, cleaning, laundry, interpersonal relationship, driving, shopping, and community activity  PERSONAL FACTORS Fitness, Past/current experiences, Social background, Time since onset of injury/illness/exacerbation, and Transportation are also affecting patient's  functional outcome.   REHAB POTENTIAL: Good  CLINICAL DECISION MAKING: Stable/uncomplicated  EVALUATION COMPLEXITY: High       GOALS: One time visit. No goals established.  PLAN: PT FREQUENCY: one time visit   Raj LOISE Blanch, PT 05/15/2024, 12:31  PM    I concur with the above findings and recommendations of the therapist:  Physician name printed:         Physician's signature:      Date:

## 2024-05-15 NOTE — Telephone Encounter (Signed)
 DME 4 wheeled rolling walker with seat and blood pressure monitor device ordered.

## 2024-05-15 NOTE — Telephone Encounter (Signed)
 Please note FL-2 form signed on 05/14/2024.

## 2024-05-18 NOTE — Telephone Encounter (Signed)
 I called patient  to let him know his b/p order has been in to [facility] however no one answered so I left a voicemail to return my call

## 2024-05-18 NOTE — Telephone Encounter (Signed)
 Faxed paper back on 05/14/2024

## 2024-05-19 ENCOUNTER — Telehealth: Payer: Self-pay | Admitting: Family

## 2024-05-19 ENCOUNTER — Other Ambulatory Visit: Payer: Self-pay | Admitting: Family

## 2024-05-19 NOTE — Telephone Encounter (Signed)
 Copied from CRM [id number]. Topic: Clinical - Medication Refill >> May 19, 2024  2:23 PM Victoria B wrote: Medication:apixaban  (ELIQUIS ) 5 MG TABS tablet  Has the patient contacted their pharmacy? Yes   (Agent: If yes, when and what did the pharmacy advise?)contact pcp  This is the patient's preferred pharmacy:  [facility] [address] [place] - [address] [place] [address] Phone: [phone] Fax: [phone]      Is this the correct pharmacy for this prescription? yes   Has the prescription been filled recently? no  Is the patient out of the medication? yes  Has the patient been seen for an appointment in the last year OR does the patient have an upcoming appointment? yes  Can we respond through MyChart? yes  Agent: Please be advised that Rx refills may take up to 3 business days. We ask that you follow-up with your pharmacy.

## 2024-05-19 NOTE — Telephone Encounter (Signed)
 Patient returned my call and I made him aware of were b/p  machine order was sent

## 2024-05-19 NOTE — Telephone Encounter (Signed)
 You're welcome!

## 2024-05-19 NOTE — Telephone Encounter (Signed)
 Noted. Not that I am aware of.

## 2024-05-19 NOTE — Telephone Encounter (Unsigned)
 Copied from CRM [id number]. Topic: Clinical - Order For Equipment >> May 14, 2024 10:28 AM Berwyn MATSU wrote: Reason for CRM: Patient and Patient called in regarding orders for DME.for Rollator walker with seat and blood pressure monitor. Patient is requesting for these orders to be sent to :  [facility] Ph: [phone] Fax: [phone] >> May 19, 2024  9:40 AM Ole G wrote: Adv patient of pending status details.SABRA would like call back when ready so she can schedule transportation

## 2024-05-19 NOTE — Telephone Encounter (Signed)
 I called patient and no one answered so I left a voicemail to return my call

## 2024-05-19 NOTE — Telephone Encounter (Signed)
 Copied from CRM [id number]. Topic: Clinical - Order For Equipment >> May 14, 2024 10:28 AM Berwyn MATSU wrote: Reason for CRM: Patient and Patient called in regarding orders for DME.for Rollator walker with seat and blood pressure monitor. Patient is requesting for these orders to be sent to :  [facility] Ph: [phone] Fax: [phone] >> May 19, 2024  9:40 AM Ole G wrote: Adv patient of pending status details.Craig Schmidt would like call back when ready so she can schedule transportation

## 2024-05-19 NOTE — Telephone Encounter (Signed)
 A document form from Emily Ketner with [employer] has been faxed: requesting last OV notes discussing need for powered mobility device, to be filled out by provider. Send document back via Fax within 7-days to 14 days. Document is located in providers tray at front office.          Fax number: [phone]

## 2024-05-19 NOTE — Telephone Encounter (Signed)
 I returned patient call no one answered so I left a voicemail to return my call.

## 2024-05-20 ENCOUNTER — Telehealth: Payer: Self-pay | Admitting: Emergency Medicine

## 2024-05-20 NOTE — Telephone Encounter (Signed)
 I call Craig Schmidt about where he want me to send his walker and the fax number was wrong I google them no fax number and the phone number I have called for two days and no one answered.  Patient told me to just sending to who we would send order too

## 2024-05-21 NOTE — Telephone Encounter (Signed)
 Requested medication (s) are due for refill today: yes  Requested medication (s) are on the active medication list: yes  Last refill:  07/14/23  Future visit scheduled: no  Notes to clinic:  Unable to refill per protocol, last refill by another provider.      Requested Prescriptions  Pending Prescriptions Disp Refills   apixaban  (ELIQUIS ) 5 MG TABS tablet 60 tablet 3    Sig: Take 1 tablet (5 mg total) by mouth 2 (two) times daily.     Hematology:  Anticoagulants - apixaban  Failed - 05/21/2024 11:52 AM      Failed - Cr in normal range and within 360 days    Creatinine, Ser  Date Value Ref Range Status  12/13/2023 1.70 (H) 0.61 - 1.24 mg/dL Final         Failed - AST in normal range and within 360 days    AST  Date Value Ref Range Status  09/15/2023 42 (H) 15 - 41 U/L Final         Passed - PLT in normal range and within 360 days    Platelets  Date Value Ref Range Status  12/13/2023 279 150 - 400 K/uL Final  07/29/2023 477 (H) 150 - 450 x10E3/uL Final         Passed - HGB in normal range and within 360 days    Hemoglobin  Date Value Ref Range Status  12/13/2023 14.0 13.0 - 17.0 g/dL Final  98/93/7974 87.3 (L) 13.0 - 17.7 g/dL Final   Total hemoglobin  Date Value Ref Range Status  12/26/2021 14.2 12.0 - 16.0 g/dL Final         Passed - HCT in normal range and within 360 days    HCT  Date Value Ref Range Status  12/13/2023 44.5 39.0 - 52.0 % Final   Hematocrit  Date Value Ref Range Status  07/29/2023 41.2 37.5 - 51.0 % Final         Passed - ALT in normal range and within 360 days    ALT  Date Value Ref Range Status  09/15/2023 17 0 - 44 U/L Final         Passed - Valid encounter within last 12 months    Recent Outpatient Visits           1 week ago Encounter for completion of form with patient   [facility], Amy J, NP   3 months ago Encounter for completion of form with patient   [facility]  [facility], Amy J, NP   5 months ago Urinary incontinence, unspecified type   [facility] at [facility], Greig PARAS, NP   9 months ago Hospital discharge follow-up   [facility], [place], NP   1 year ago Runny nose   [facility], Greig PARAS, NP

## 2024-05-21 NOTE — Telephone Encounter (Signed)
 I have called and spoke with patient several times notes in the chart

## 2024-05-22 NOTE — Telephone Encounter (Signed)
 Apixaban  last prescribed on 07/14/2023 by Drusilla Sabas RAMAN, MD. During the interim report to the Emergency Department/Urgent Care/call 911 for immediate medical evaluation.

## 2024-05-25 ENCOUNTER — Ambulatory Visit: Payer: MEDICAID

## 2024-05-25 ENCOUNTER — Encounter: Payer: Self-pay | Admitting: Radiology

## 2024-05-25 NOTE — Telephone Encounter (Signed)
 I called patient and made him aware of pcp recommendation on medication rtefill

## 2024-05-26 ENCOUNTER — Telehealth: Payer: Self-pay

## 2024-05-26 NOTE — Telephone Encounter (Signed)
 Copied from CRM #[id number]. Topic: Clinical - Prescription Issue >> May 25, 2024 10:34 AM Rea ORN wrote: Reason for CRM: pt girlfriend called to follow up on request for Eliquis  that she called in last week. Pt is currently out (has been out for several days) and needs this medication asap.  Therisa also asked for a follow up on walker with seat request.  Please call back [phone].

## 2024-05-27 ENCOUNTER — Other Ambulatory Visit (HOSPITAL_COMMUNITY): Payer: Self-pay

## 2024-05-27 MED ORDER — APIXABAN 5 MG PO TABS: 5.0000 mg | ORAL_TABLET | Freq: Two times a day (BID) | ORAL | 3 refills | Status: AC

## 2024-05-27 NOTE — Telephone Encounter (Signed)
 I called and spoke with patient on 05/26/2024 about PCP recommendations

## 2024-06-01 ENCOUNTER — Telehealth: Payer: Self-pay | Admitting: Emergency Medicine

## 2024-06-01 ENCOUNTER — Ambulatory Visit: Payer: MEDICAID

## 2024-06-01 NOTE — Telephone Encounter (Signed)
 Copied from CRM [id number]. Topic: General - Other >> Jun 01, 2024 11:15 AM Craig Schmidt wrote: Patient states that he is trying to get an electric wheelchair from [employer], but they are requiring him to have a visit with his pcp. Patient states that the wheel chair was discussed during his visit on 10/17. Please advise.  I called patient and made him aware that I sent [employer] the office notes on today 05/02/2024

## 2024-06-02 NOTE — Telephone Encounter (Signed)
 I faxed back 06/01/2024

## 2024-06-03 NOTE — Telephone Encounter (Signed)
 I faxed paperwork to new number

## 2024-06-03 NOTE — Telephone Encounter (Signed)
 Copied from CRM [id number]. Topic: General - Call Back - No Documentation >> Jun 02, 2024  2:31 PM Avram MATSU wrote: Reason for CRM: [facility] is calling for an update on pt electric wheelchair, I inofrmed it was faxed over today. [facility] would like to confirm fax with someone, please advise [phone]   Fax:[phone]

## 2024-06-04 ENCOUNTER — Telehealth: Payer: Self-pay | Admitting: Emergency Medicine

## 2024-06-04 ENCOUNTER — Telehealth: Payer: Self-pay | Admitting: Family

## 2024-06-04 NOTE — Telephone Encounter (Signed)
 Copied from CRM [id number]. Topic: General - Other >> Jun 04, 2024 10:12 AM Tiffini S wrote: Reason for CRM: Arlean states that she has called several times and need an update about a referral for an electric wheelchair   Please call the patient back at [phone] to follow up

## 2024-06-04 NOTE — Telephone Encounter (Signed)
 Copied from CRM [id number]. Topic: Clinical - Order For Equipment >> Jun 04, 2024 10:19 AM Kendralyn S wrote: Reason for CRM: pt needs to us  to request wheelchair from [employer], before they can move along with the process, pt has been fitted for the electric wheelchair already

## 2024-06-04 NOTE — Telephone Encounter (Signed)
 Patient will need an appointment with PCP. For Electric wheel chair

## 2024-06-04 NOTE — Telephone Encounter (Signed)
 Duplicate message . Original message routed to office patient will need an appointment last seen in July. Last visit must be within 90 days.

## 2024-06-04 NOTE — Telephone Encounter (Signed)
 Patient is calling upset reporting that [employer] is requesting an office visit to discuss the power wheelchair. Because it was not documented. Patient is stating that he did discuss this with Amy. Please advise

## 2024-06-04 NOTE — Telephone Encounter (Signed)
 Patient called upset about office notes on last visit.  I sent patient upfront to schedule an video visit

## 2024-06-05 NOTE — Telephone Encounter (Signed)
 Please refer to CRM from 04/21/24  3:24 PM:  Copied from CRM #[id number]. Topic: Referral - Question >> Apr 21, 2024 12:21 PM Sophia H wrote: Reason for CRM: Updated referral needed - patient is scheduled to be seen for a wheel chair assessment for Thursday 10/02. Has form that can be faxed over, will send. Needing referral to state specifically that patient is needing to have a wheel chair assessment done. Can be done via Epic.     Please note as requested updated referral to Physical Therapy ordered on 04/21/24 1536.  Per patient preference schedule appointment for further discussion.

## 2024-06-08 ENCOUNTER — Encounter: Payer: MEDICAID | Admitting: Family

## 2024-06-08 NOTE — Telephone Encounter (Signed)
 I called patient to see why he did not show up for video appointment patient did not know that it was through my chart.  Patient got his password resent and his appointment

## 2024-06-08 NOTE — Progress Notes (Signed)
 Erroneous encounter-disregard

## 2024-06-10 ENCOUNTER — Ambulatory Visit: Payer: MEDICAID

## 2024-06-11 ENCOUNTER — Other Ambulatory Visit: Payer: Self-pay | Admitting: Family

## 2024-06-11 DIAGNOSIS — J3089 Other allergic rhinitis: Secondary | ICD-10-CM

## 2024-06-11 NOTE — Telephone Encounter (Signed)
 Copied from CRM #[id number]. Topic: Clinical - Medication Refill >> Jun 11, 2024 11:30 AM Amy B wrote: Medication: cetirizine  (ZYRTEC ) 5 MG tablet  Has the patient contacted their pharmacy? Yes (Agent: If no, request that the patient contact the pharmacy for the refill. If patient does not wish to contact the pharmacy document the reason why and proceed with request.) (Agent: If yes, when and what did the pharmacy advise?)  This is the patient's preferred pharmacy:  [facility] [address], [place] - [address] [place] [address] Phone: [phone] Fax: [phone]  Is this the correct pharmacy for this prescription? Yes If no, delete pharmacy and type the correct one.   Has the prescription been filled recently? No  Is the patient out of the medication? Yes  Has the patient been seen for an appointment in the last year OR does the patient have an upcoming appointment? Yes  Can we respond through MyChart? No  Agent: Please be advised that Rx refills may take up to 3 business days. We ask that you follow-up with your pharmacy.

## 2024-06-12 NOTE — Telephone Encounter (Signed)
 Complete

## 2024-06-13 NOTE — Telephone Encounter (Signed)
 Duplicate request, refilled 06/12/24.  Requested Prescriptions  Pending Prescriptions Disp Refills   cetirizine  (ZYRTEC ) 5 MG tablet 90 tablet 0    Sig: Take 1 tablet (5 mg total) by mouth daily.     Ear, Nose, and Throat:  Antihistamines 2 Failed - 06/13/2024  7:50 AM      Failed - Cr in normal range and within 360 days    Creatinine, Ser  Date Value Ref Range Status  12/13/2023 1.70 (H) 0.61 - 1.24 mg/dL Final         Passed - Valid encounter within last 12 months    Recent Outpatient Visits           1 month ago Encounter for completion of form with patient   [facility], Amy J, NP   4 months ago Encounter for completion of form with patient   [facility] at [facility], Amy J, NP   5 months ago Urinary incontinence, unspecified type   [facility] at [facility], Greig PARAS, NP   10 months ago Hospital discharge follow-up   [facility] at [facility], [place], NP   1 year ago Runny nose   [facility], Greig PARAS, NP

## 2024-06-15 ENCOUNTER — Other Ambulatory Visit: Payer: Self-pay | Admitting: Family

## 2024-06-15 ENCOUNTER — Ambulatory Visit: Payer: Self-pay

## 2024-06-15 DIAGNOSIS — Z0100 Encounter for examination of eyes and vision without abnormal findings: Secondary | ICD-10-CM

## 2024-06-15 NOTE — Telephone Encounter (Signed)
 FYI Only or Action Required?: FYI only for provider: would like rx for eye drops, pt refused scheduling appt today, states that he sees PCP in Dec.  Patient was last seen in primary care on 05/08/2024 by [name], NP.  Called Nurse Triage reporting Eye Problem.  Symptoms began about a month ago.  Interventions attempted: OTC medications: visine.  Symptoms are: unchanged.  Triage Disposition: See Physician Within 24 Hours  Patient/caregiver understands and will follow disposition?: No  Copied from CRM #[id number]. Topic: Clinical - Red Word Triage >> Jun 15, 2024  1:34 PM Antwanette L wrote: Red Word that prompted transfer to Nurse Triage: Therisa pt girlfriend called to report the patient is experiencing dry, itchy burning eyes, and blurred vision. Reason for Disposition  Eye pain present > 24 hours  Answer Assessment - Initial Assessment Questions 1. ONSET: When did the pain start? (e.g., minutes, hours, days)     Month or 2 2. TIMING: Does the pain come and go, or has it been constant since it started? (e.g., constant, intermittent, fleeting)     Constant 3. SEVERITY: How bad is the pain?  (Scale 1-10; mild, moderate or severe)     Severe burning 4. LOCATION: Where does it hurt?  (e.g., eyelid, eye, cheekbone)     eyeball 5. CAUSE: What do you think is causing the pain?     unsure 6. VISION: Do you have blurred vision or changes in your vision?      blurry 7. EYE DISCHARGE: Is there any discharge (pus) from the eye(s)?  If Yes, ask: What color is it?      Gets crust in eyes all the time 8. FEVER: Do you have a fever? If Yes, ask: What is it, how was it measured, and when did it start?      denies 9. OTHER SYMPTOMS: Do you have any other symptoms? (e.g., headache, nasal discharge, facial rash)     denies  Denies any chemical exposure. Has been using Visine, no relief.  Pt refused appt at time of call, would like medication sent in, states that he sees  the PCP in December. Pt also would like to come to clinic tomorrow and pick up a paper script for the chair.  Protocols used: Eye Pain and Other Symptoms-A-AH

## 2024-06-15 NOTE — Telephone Encounter (Signed)
-   Medication List review does not have medicated eye drops. Referral to Ophthalmology for evaluation/management. - For home use only DME 4 wheeled rolling walker with seat prescribed 05/15/24 0917.

## 2024-06-15 NOTE — Telephone Encounter (Unsigned)
 Copied from CRM #[id number]. Topic: Clinical - Order For Equipment >> Jun 15, 2024  9:48 AM Rosaria BRAVO wrote: Reason for CRM: Pt called requesting to have a printed prescription for his Rolator (needs the one with the plexiglas)   Best contact: [phone] Therisa

## 2024-06-16 ENCOUNTER — Telehealth: Payer: Self-pay

## 2024-06-16 NOTE — Telephone Encounter (Signed)
 Copied from CRM [id number]. Topic: Clinical - Order For Equipment >> Jun 16, 2024  9:42 AM Tobias CROME wrote: Reason for CRM: Patient and Therisa requesting papercopy prescription for Rolator walker with plexiglass. Therisa and patient states they spoke to the company where prescription was sent to but was informed the insurance will not cover it. Patient requesting to pick up papercopy so they may take to a supply store that will be able to provide rolator.   Patient states the tires of his current rolator are tore up and it is really wobbly and is at risk for falling.   Best callback number: [phone]

## 2024-06-17 NOTE — Telephone Encounter (Signed)
 Copied from CRM [id number]. Topic: Clinical - Order For Equipment >> Jun 17, 2024 12:11 PM Charlet HERO wrote: Reason for CRM: Incontinence Supply Disposable ([employer] DISPOSABLE BED MATS) MISC Incontinence Supply Disposable (PROCARE ADULT BRIEFS LARGE) MISC Needs to have the script updated on the fax that was sent over.

## 2024-06-22 ENCOUNTER — Telehealth (HOSPITAL_COMMUNITY): Payer: Self-pay | Admitting: Cardiology

## 2024-06-22 NOTE — Telephone Encounter (Signed)
 Called to confirm/remind patient of their appointment at the [facility] on 06/22/2024.   Appointment:   [x] Confirmed  [] Left mess   [] No answer/No voice mail  [] VM Full/unable to leave message  [] Phone not in service  Patient reminded to bring all medications and/or complete list.  Confirmed patient has transportation. Gave directions, instructed to utilize valet parking.

## 2024-06-22 NOTE — Telephone Encounter (Signed)
 Patient and girlfriend Craig Schmidt called in to check on the status of hand written prescription for rolator?

## 2024-06-23 ENCOUNTER — Ambulatory Visit (HOSPITAL_COMMUNITY)
Admission: RE | Admit: 2024-06-23 | Discharge: 2024-06-23 | Disposition: A | Payer: MEDICAID | Source: Ambulatory Visit | Attending: Cardiology | Admitting: Cardiology

## 2024-06-23 ENCOUNTER — Encounter (HOSPITAL_COMMUNITY): Payer: Self-pay | Admitting: Cardiology

## 2024-06-23 ENCOUNTER — Telehealth: Payer: Self-pay | Admitting: Family

## 2024-06-23 ENCOUNTER — Ambulatory Visit (HOSPITAL_COMMUNITY): Payer: Self-pay | Admitting: Cardiology

## 2024-06-23 VITALS — BP 122/80 | HR 81 | Ht 67.0 in | Wt 178.6 lb

## 2024-06-23 DIAGNOSIS — F1410 Cocaine abuse, uncomplicated: Secondary | ICD-10-CM | POA: Insufficient documentation

## 2024-06-23 DIAGNOSIS — Z9181 History of falling: Secondary | ICD-10-CM | POA: Insufficient documentation

## 2024-06-23 DIAGNOSIS — I429 Cardiomyopathy, unspecified: Secondary | ICD-10-CM | POA: Insufficient documentation

## 2024-06-23 DIAGNOSIS — Z560 Unemployment, unspecified: Secondary | ICD-10-CM | POA: Insufficient documentation

## 2024-06-23 DIAGNOSIS — E782 Mixed hyperlipidemia: Secondary | ICD-10-CM | POA: Diagnosis not present

## 2024-06-23 DIAGNOSIS — R42 Dizziness and giddiness: Secondary | ICD-10-CM | POA: Insufficient documentation

## 2024-06-23 DIAGNOSIS — I050 Rheumatic mitral stenosis: Secondary | ICD-10-CM | POA: Insufficient documentation

## 2024-06-23 DIAGNOSIS — R9431 Abnormal electrocardiogram [ECG] [EKG]: Secondary | ICD-10-CM | POA: Insufficient documentation

## 2024-06-23 DIAGNOSIS — D333 Benign neoplasm of cranial nerves: Secondary | ICD-10-CM | POA: Insufficient documentation

## 2024-06-23 DIAGNOSIS — Z7901 Long term (current) use of anticoagulants: Secondary | ICD-10-CM | POA: Insufficient documentation

## 2024-06-23 DIAGNOSIS — I5042 Chronic combined systolic (congestive) and diastolic (congestive) heart failure: Secondary | ICD-10-CM | POA: Diagnosis not present

## 2024-06-23 DIAGNOSIS — Z79899 Other long term (current) drug therapy: Secondary | ICD-10-CM | POA: Insufficient documentation

## 2024-06-23 DIAGNOSIS — F17210 Nicotine dependence, cigarettes, uncomplicated: Secondary | ICD-10-CM | POA: Insufficient documentation

## 2024-06-23 DIAGNOSIS — Z59868 Other specified financial insecurity: Secondary | ICD-10-CM | POA: Insufficient documentation

## 2024-06-23 DIAGNOSIS — I2510 Atherosclerotic heart disease of native coronary artery without angina pectoris: Secondary | ICD-10-CM | POA: Insufficient documentation

## 2024-06-23 DIAGNOSIS — I252 Old myocardial infarction: Secondary | ICD-10-CM | POA: Insufficient documentation

## 2024-06-23 DIAGNOSIS — Z555 Less than a high school diploma: Secondary | ICD-10-CM | POA: Insufficient documentation

## 2024-06-23 DIAGNOSIS — I110 Hypertensive heart disease with heart failure: Secondary | ICD-10-CM | POA: Insufficient documentation

## 2024-06-23 DIAGNOSIS — Z5982 Transportation insecurity: Secondary | ICD-10-CM | POA: Insufficient documentation

## 2024-06-23 DIAGNOSIS — I69065 Other paralytic syndrome following nontraumatic subarachnoid hemorrhage, bilateral: Secondary | ICD-10-CM | POA: Insufficient documentation

## 2024-06-23 LAB — LIPID PANEL
Cholesterol: 189 mg/dL (ref 0–200)
HDL: 66 mg/dL (ref >40)
LDL Cholesterol: 111 mg/dL — ABNORMAL HIGH (ref 0–99)
Total CHOL/HDL Ratio: 2.9 ratio
Triglycerides: 61 mg/dL (ref <150)
VLDL: 12 mg/dL (ref 0–40)

## 2024-06-23 LAB — BASIC METABOLIC PANEL WITH GFR
Anion gap: 6 (ref 5–15)
BUN: 30 mg/dL — ABNORMAL HIGH (ref 6–20)
CO2: 24 mmol/L (ref 22–32)
Calcium: 9.3 mg/dL (ref 8.9–10.3)
Chloride: 105 mmol/L (ref 98–111)
Creatinine, Ser: 1.47 mg/dL — ABNORMAL HIGH (ref 0.61–1.24)
GFR, Estimated: 57 mL/min — ABNORMAL LOW (ref >60)
Glucose, Bld: 101 mg/dL — ABNORMAL HIGH (ref 70–99)
Potassium: 5.4 mmol/L — ABNORMAL HIGH (ref 3.5–5.1)
Sodium: 135 mmol/L (ref 135–145)

## 2024-06-23 LAB — BRAIN NATRIURETIC PEPTIDE: B Natriuretic Peptide: 357 pg/mL — ABNORMAL HIGH (ref 0.0–100.0)

## 2024-06-23 MED ORDER — SPIRONOLACTONE 25 MG PO TABS: 25.0000 mg | ORAL_TABLET | Freq: Every day | ORAL | 3 refills | Status: DC

## 2024-06-23 MED ORDER — CARVEDILOL 6.25 MG PO TABS: 6.2500 mg | ORAL_TABLET | Freq: Two times a day (BID) | ORAL | 3 refills | Status: AC

## 2024-06-23 MED ORDER — SPIRONOLACTONE 25 MG PO TABS: 12.5000 mg | ORAL_TABLET | Freq: Every day | ORAL | 3 refills | Status: AC

## 2024-06-23 MED ORDER — ROSUVASTATIN CALCIUM 20 MG PO TABS: 20.0000 mg | ORAL_TABLET | Freq: Every day | ORAL | 3 refills | Status: AC

## 2024-06-23 NOTE — Progress Notes (Addendum)
 -  [facility] Note   Primary Care: [name], NP HF Cardiologist: Dr. Rolan  Chief complaint: CHF  HPI: Craig Schmidt is a 53 y.o. 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   Admitted 02/23 with left MCA CVA s/p tenecteplase . Stroke felt to be likely secondary to cardiomyopathy. Echo EF 25-30%,k RV mildly reduced, RVSP 50 mmHg, moderate MS, mild to moderate MR, moderate to severe TR, dilated IVC. TEE 02/23: EF 25-30%, RV severely reduced, MV stenotic, MVA 1.45 cm2 consistent with severe MS, moderate MR, no interatrial shunt, no LV or LAA thrombus. Cardiology consulted. CM likely d/t subtance abuse +/- uncontrolled HTN. Had been started on coumadin  but later switched to eliquis  d/t concern for compliance given his social situation.  Admitted 5/23 with acute inferolateral STEMI. Had used cocaine  24 hrs prior to presentation. Emergent LHC with 100% apical LAD suspected embolic vs plaque rupture with thrombosis. LVEDP 40. Echo this admit showed EF < 20%, ? RV okay. He was significantly volume overloaded and hypertensive. Started on lasix  gtt and nitro gtt. Had good diuresis but Scr began to trend up, 1.5>1.77>1.87>2.0.  Developed soft BP and BP-active meds held. There was concern for low-output. PICC placed and Co-ox within normal range. Drips weaned and GDMT started. He was discharged home, weight 156 lbs.  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   R/LHC (9/23) arranged to assess mitral stenosis, which showed primarily pulmonary venous hypertension, normal RA pressure and minimally elevated PCWP, and moderate mitral stenosis.   Admitted 5/24 with CHF, 2/2 running out of meds x several weeks.  Echo showed EF 25-30%, RV mildly reduced, mild MR and mild MS. Diuresed with IV lasix  and GDMT titrated. Discharged home, weight 138 lbs.  Echo in 12/24 showed EF < 20%, mild-moderate LV dilation, moderate RV dysfunction/mild RV enlargement, severe LAE, moderate mitral stenosis with restricted leaflets.   Patient had a recurrent CVA in 2/25, right parietal area due to right MCA branch occlusion.   Patient returns for CHF followup.  He has significant residual effects from his prior CVAs with weakness in both arms.  He is now in an apartment with his girlfriend.  He is able to walk some in the apartment but is weak and unstable and his legs lock up.  He is unable to manage a cane or walker due to arm weakness and uses a wheelchair when he leaves his appt. Still using cocaine , but cutting back.  Last use about 2 wks ago.  No dyspnea walking around apartment but he tires easily. No chest pain.  No lightheadedness or palpitations.  Trying to stay away from sodium. Weight is up but he has been eating better.   ECG (personally reviewed): NSR, nonspecific lateral TWIs  Labs (5/24): K 4.5, creatinine 2.02 Labs (7/24): K 4.2, creatinine 1.7 Labs (2/25): LDL 77, K 4.8, creatinine 8.73 Labs (5/25): LDL 113, hgb 14, K 4.5, creatinine 1.7  PMH: 1. CVA: Left MCA CVA in 2/23, treated with tenecteplase .  Thought to be due to low EF/cardiomyopathy.  - Recurrent CVA 2/25: Right parietal embolic CVA (right MCA branch occlusion).  2. Left vestibular schwannoma.  3. Hyperlipidemia 4. H/o cocaine  abuse. 5. H/o ETOH abuse.  6. Mitral stenosis: The  mitral valve looks rheumatic.  Most recent echo in 9/23 showed moderate mitral stenosis with trivial MR.  - R/LHC (9/23) showed moderate mitral stenosis, mitral valve mean gradient 10.9 mmHg, MVA 1.74 cm^2 7. Chronic systolic CHF: Mixed ischemic/nonischemic CMP.  Cocaine /ETOH abuse, HTN, CAD.  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 - Inferolateral MI 5/23 with LHC showing occlusion of the apical LAD.  - R/LHC (9/23): RA mean 4, PA 50/20 (mean 33), PCWP 15, CO/CI (Fick) 5.74/3.06, PVR 3.1 WU - Echo (5/24): EF 25-30%, RV mildly reduced, mild MR and mild MS. - Echo (12/24): EF < 20%, mild-moderate LV dilation, moderate RV dysfunction/mild RV enlargement, severe LAE, moderate mitral stenosis with restricted leaflets.  8. CAD: Inferolateral STEMI in 5/23 with LHC showing occluded apical LAD managed medically (?embolic vs plaque rupture).    Current Outpatient Medications  Medication Sig Dispense Refill   apixaban  (ELIQUIS ) 5 MG TABS tablet Take 1 tablet (5 mg total) by mouth 2 (two) times daily. 60 tablet 3   Blood Pressure Monitor DEVI Use as directed to check home blood pressure 2-3 times a week 1 each 0   cetirizine  (ZYRTEC ) 5 MG tablet TAKE 1 TABLET(5 MG) BY MOUTH DAILY 90 tablet 0   empagliflozin  (JARDIANCE ) 10 MG TABS tablet Take 1 tablet (10 mg total) by mouth daily. 30 tablet 5   furosemide  (LASIX ) 20 MG tablet Take 1 tablet (20 mg total) by mouth as needed. For weight gain of 3lb in 24 hours or 5 lb in a week     Incontinence Supplies (MALE URINAL) MISC 1 each by Does not apply route daily as needed. 1 each 2   Incontinence Supply Disposable ([employer] DISPOSABLE BED MATS) MISC 1 each by Does not apply route daily as needed. 9 each 11   Incontinence Supply Disposable (PROCARE ADULT BRIEFS LARGE) MISC 1 each by Does not apply route daily as needed. 25 each 11   rosuvastatin  (CRESTOR ) 10 MG tablet Take 1 tablet (10 mg total) by mouth daily. 90 tablet 3   sacubitril -valsartan  (ENTRESTO ) 49-51 MG Take 1 tablet by mouth 2 (two) times daily. 60 tablet 11    triamcinolone  (KENALOG ) 0.025 % cream Apply 1 Application topically 2 (two) times daily. 454 g 2   carvedilol  (COREG ) 6.25 MG tablet Take 1 tablet (6.25 mg total) by mouth 2 (two) times daily. 60 tablet 3   docusate sodium  (COLACE) 100 MG capsule Take 1 capsule (100 mg total) by mouth daily as needed for mild constipation. (Patient not taking: Reported on 06/23/2024) 90 capsule 0   nicotine  polacrilex (NICORETTE ) 2 MG gum Take 1 each (2 mg total) by mouth as needed for smoking cessation. (Patient not taking: Reported on 06/23/2024) 110 tablet 0   oxyCODONE -acetaminophen  (PERCOCET) 5-325 MG tablet Take 1 tablet by mouth every 6 (six) hours as needed. (Patient not taking: Reported on 06/23/2024) 15 tablet 0   polyethylene glycol (MIRALAX  / GLYCOLAX ) 17 g packet Take 17 g by mouth daily as needed. (Patient not taking: Reported on 06/23/2024) 100 each 0   spironolactone  (ALDACTONE ) 25 MG tablet Take 1 tablet (25 mg total) by mouth daily. 30 tablet 3  No current facility-administered medications for this encounter.   Allergies  Allergen Reactions   Shellfish Allergy Anaphylaxis   Peanut (Diagnostic) Itching   Social History   Socioeconomic History   Marital status: Significant Other    Spouse name: Not on file   Number of children: 7   Years of education: Not on file   Highest education level: 9th grade  Occupational History   Occupation: unemployed    Comment: experiencing homelessness-living at daily rent motels.  Tobacco Use   Smoking status: Every Day    Current packs/day: 1.00    Average packs/day: 1 pack/day for 41.0 years (41.0 ttl pk-yrs)    Types: Cigarettes    Passive exposure: Current   Smokeless tobacco: Never  Vaping Use   Vaping status: Never Used  Substance and Sexual Activity   Alcohol  use: Yes    Alcohol /week: 63.0 standard drinks of alcohol     Types: 63 Cans of beer per week    Comment: 3-40oz beer/day x10 years. 1-2 40oz beer/day in 20s/30s   Drug use: Yes     Types: Crack cocaine , Cocaine     Comment: last use 01/13/22   Sexual activity: Not Currently  Other Topics Concern   Not on file  Social History Narrative   Not on file   Social Drivers of Health   Financial Resource Strain: Medium Risk (02/03/2024)   Overall Financial Resource Strain (CARDIA)    Difficulty of Paying Living Expenses: Somewhat hard  Food Insecurity: Food Insecurity Present (09/04/2023)   Hunger Vital Sign    Worried About Running Out of Food in the Last Year: Sometimes true    Ran Out of Food in the Last Year: Sometimes true  Transportation Needs: Unmet Transportation Needs (09/04/2023)   PRAPARE - Transportation    Lack of Transportation (Medical): Yes    Lack of Transportation (Non-Medical): Yes  Physical Activity: Not on file  Stress: No Stress Concern Present (02/03/2024)   [employer] of Occupational Health - Occupational Stress Questionnaire    Feeling of Stress: Not at all  Social Connections: Moderately Integrated (02/03/2024)   Social Connection and Isolation Panel    Frequency of Communication with Friends and Family: More than three times a week    Frequency of Social Gatherings with Friends and Family: More than three times a week    Attends Religious Services: More than 4 times per year    Active Member of [employer] or Organizations: No    Attends Banker Meetings: Never    Marital Status: Living with partner  Intimate Partner Violence: Not At Risk (09/04/2023)   Humiliation, Afraid, Rape, and Kick questionnaire    Fear of Current or Ex-Partner: No    Emotionally Abused: No    Physically Abused: No    Sexually Abused: No   Family History  Problem Relation Age of Onset   Diabetes Mother    BP 122/80   Pulse 81   Ht 5' 7 (1.702 m)   Wt 81 kg (178 lb 9.6 oz)   SpO2 98%   BMI 27.97 kg/m   Wt Readings from Last 3 Encounters:  06/23/24 81 kg (178 lb 9.6 oz)  02/03/24 72.1 kg (159 lb)  12/13/23 72.2 kg (159 lb 3.2 oz)    PHYSICAL EXAM: General: NAD, frail.  Neck: No JVD, no thyromegaly or thyroid  nodule.  Lungs: Clear to auscultation bilaterally with normal respiratory effort. CV: Nondisplaced PMI.  Heart regular S1/S2, no S3/S4, no murmur.  No  peripheral edema.  No carotid bruit.  Normal pedal pulses.  Abdomen: Soft, nontender, no hepatosplenomegaly, no distention.  Skin: Intact without lesions or rashes.  Neurologic: Alert and oriented x 3. Right side is especially weak.  Psych: Normal affect. Extremities: No clubbing or cyanosis.  HEENT: Normal.   ASSESSMENT & PLAN: 1. Chronic systolic CHF: Patient was found to have cardiomyopathy in 2/23 at time of admission for CVA.  Echo showed EF 25-30% at that time.  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  MI in 5/23 (?cardioembolic) also contributes.  Echo in 5/23 showed LV EF < 20%, RV mildly decreased function, abnormal mitral valve looks rheumatic => suspect moderate mitral stenosis at least with planimetered MVA 1.6 cm^2 and calculated MVA by VTI 0.8 cm^2; mean gradient only 6 mmHg but likely function of low cardiac output. Echo in 12/24 showed EF < 20%, mild-moderate LV dilation, moderate RV dysfunction/mild RV enlargement, severe LAE, moderate mitral stenosis with restricted leaflets. NYHA class II but symptoms confounded by weakness/unsteadiness from prior CVAs.  He is not volume overloaded on exam.  - He can continue using Lasix  prn.   - Continue Entresto  49/51 bid.  - Continue Jardiance  10  mg daily. - Increase Coreg  to 6.25 mg bid.   - Increase spironolactone  to 25 mg daily.  - If he can get off cocaine , would be ICD candidate (narrow QRS so no CRT).  - Not candidate for advanced therapies with debility and active cocaine  use.  2. CAD: Admit 6/23 with inferolateral STEMI by ECG, cath showed occluded apical LAD.  Cannot rule out plaque rupture in setting of cocaine  abuse, but given history of suspected  cardioembolic CVA, wonder if this was not a cardioembolic MI. No chest pain.   - Continue Eliquis  5 mg bid.  - No ASA with Eliquis  use.  - He is now taking his statin regularly, check lipids/LFTs today.  3. HTN: BP controlled.   4. CVA: 2/23 and again in 2/25, has both right and left arm weakness.  Thought to be cardioembolic, started on anticoagulation (was off Eliquis  when he had the 2nd CVA).   - Continue Eliquis . 5. Cocaine  abuse: Ongoing use. Discussed cessation. He is trying to cut back.  6. 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   Echo 9/23 shows moderate MS with trivial MR. Unusual given age. He denies known rheumatic fever though he remembers have strep throat at least twice as a child. L/RHC (9/23) showed moderate mitral stenosis, with mitral valve mean gradient 10.9 mmHg, MVA 1.74 cm^2.  Echo in 12/24 also showed moderate mitral stenosis.  - He would not be a candidate for surgical mitral valve replacement.  7. Vestibular schwannoma: Has had vertigo and falls. No falls recently.    Follow up in 3 wks with HF pharmacist, 2 months with APP.   I spent 31 minutes reviewing records, interviewing/examining patient, and managing orders.   Craig Schmidt  06/23/2024

## 2024-06-23 NOTE — Telephone Encounter (Signed)
 A document form has been faxed: [employer] requesting office visit notes after appt on 07/21/24, to be filled out by provider. Send document back via Fax after appt completion. Document is located in providers tray at front office.          Fax number: [phone]

## 2024-06-23 NOTE — Addendum Note (Signed)
 Encounter addended by: Rolan Ezra RAMAN, MD on: 06/23/2024 1:41 PM  Actions taken: Clinical Note Signed

## 2024-06-23 NOTE — Patient Instructions (Signed)
 Medication Changes:  INCREASE Carvedilol  to 6.25 mg Twice daily   INCREASE Spironolactone  to 25 mg (1 tab) Daily  Lab Work:  Labs done today, your results will be available in MyChart, we will contact you for abnormal readings.  Your physician recommends that you return for lab work in: 1-2 weeks  Testing/Procedures:  Your physician has requested that you have an echocardiogram. Echocardiography is a painless test that uses sound waves to create images of your heart. It provides your doctor with information about the size and shape of your heart and how well your heart's chambers and valves are working. This procedure takes approximately one hour. There are no restrictions for this procedure. Please do NOT wear cologne, perfume, aftershave, or lotions (deodorant is allowed). Please arrive 15 minutes prior to your appointment time.  Please note: We ask at that you not bring children with you during ultrasound (echo/ vascular) testing. Due to room size and safety concerns, children are not allowed in the ultrasound rooms during exams. Our front office staff cannot provide observation of children in our lobby area while testing is being conducted. An adult accompanying a patient to their appointment will only be allowed in the ultrasound room at the discretion of the ultrasound technician under special circumstances. We apologize for any inconvenience.  Special Instructions // Education:  Do the following things EVERYDAY: Weigh yourself in the morning before breakfast. Write it down and keep it in a log. Take your medicines as prescribed Eat low salt foods--Limit salt (sodium) to 2000 mg per day.  Stay as active as you can everyday Limit all fluids for the day to less than 2 liters   Follow-Up in:   Please follow up with our heart failure pharmacist in 4 weeks  Your physician recommends that you schedule a follow-up appointment in: 2 months   At the [facility],  you and your health needs are our priority. We have a designated team specialized in the treatment of Heart Failure. This Care Team includes your primary Heart Failure Specialized Cardiologist (physician), Advanced Practice Providers (APPs- Physician Assistants and Nurse Practitioners), and Pharmacist who all work together to provide you with the care you need, when you need it.   You may see any of the following providers on your designated Care Team at your next follow up:  Dr. Toribio Fuel Dr. Ezra Shuck Dr. Odis Brownie Greig Mosses, NP Caffie Shed, [place] [facility] [place], [place] Beckey Coe, NP Jordan Lee, NP Tinnie Redman, PharmD   Please be sure to bring in all your medications bottles to every appointment.   Need to Contact Us :  If you have any questions or concerns before your next appointment please send us  a message through [place] or call our office at [phone].    TO LEAVE A MESSAGE FOR THE NURSE SELECT OPTION 2, PLEASE LEAVE A MESSAGE INCLUDING: YOUR NAME DATE OF BIRTH CALL BACK NUMBER REASON FOR CALL**this is important as we prioritize the call backs  YOU WILL RECEIVE A CALL BACK THE SAME DAY AS LONG AS YOU CALL BEFORE 4:00 PM

## 2024-06-24 ENCOUNTER — Telehealth: Payer: Self-pay

## 2024-06-24 NOTE — Telephone Encounter (Signed)
 Office is aware of patient request per note request has been place in PCP mail box to be signed.

## 2024-06-24 NOTE — Telephone Encounter (Signed)
 Copied from CRM #[id number]. Topic: Clinical - Medical Advice >> Jun 24, 2024 10:31 AM Anairis L wrote: Reason for CRM: Patient is calling in because he would like a paper script for a  Rolator chair. Some facilities do not accept insurance so he would like to go in person.

## 2024-06-25 NOTE — Telephone Encounter (Signed)
 noted

## 2024-06-26 ENCOUNTER — Other Ambulatory Visit: Payer: Self-pay | Admitting: Family

## 2024-06-26 DIAGNOSIS — R159 Full incontinence of feces: Secondary | ICD-10-CM

## 2024-06-26 DIAGNOSIS — R262 Difficulty in walking, not elsewhere classified: Secondary | ICD-10-CM

## 2024-06-26 DIAGNOSIS — R32 Unspecified urinary incontinence: Secondary | ICD-10-CM

## 2024-06-26 MED ORDER — CVS DISPOSABLE BED MATS MISC: 1.0000 | Freq: Every day | 11 refills | Status: DC | PRN

## 2024-06-26 NOTE — Telephone Encounter (Signed)
-   Incontinence Supply Disposable prescribed (06/26/2024  9:00 AM EST). - Wheel rolling walker with seat prescribed 06/26/24 0900.  - Please print prescription for patient as requested.

## 2024-07-02 ENCOUNTER — Telehealth: Payer: Self-pay | Admitting: Emergency Medicine

## 2024-07-02 ENCOUNTER — Telehealth: Payer: Self-pay

## 2024-07-02 NOTE — Telephone Encounter (Signed)
 Copied from CRM [id number]. Topic: Clinical - Order For Equipment >> Jun 30, 2024  1:42 PM Zy'onna H wrote: Reason for CRM: Nurse called in from the facility in which the patient is stating.  He has place in a request for the following DME equipment:  Nurse callback number: [phone] - Blood Pressure Cuff

## 2024-07-02 NOTE — Telephone Encounter (Signed)
 Copied from CRM #[id number]. Topic: General - Other >> Jul 02, 2024  3:38 PM Tonda B wrote: Reason for CRM: pt homecare nurse [facility] will pick up pt rx at the office  Please call pt if any questions [phone]  I call patient and made him aware that home health nurse is not on DPE so his girlfriend will be here to pick it up

## 2024-07-02 NOTE — Telephone Encounter (Signed)
I called and spoke with patient.

## 2024-07-03 ENCOUNTER — Ambulatory Visit (HOSPITAL_COMMUNITY)
Admission: RE | Admit: 2024-07-03 | Discharge: 2024-07-03 | Disposition: A | Payer: MEDICAID | Source: Ambulatory Visit | Attending: Internal Medicine

## 2024-07-03 ENCOUNTER — Other Ambulatory Visit: Payer: Self-pay | Admitting: Family

## 2024-07-03 DIAGNOSIS — I5042 Chronic combined systolic (congestive) and diastolic (congestive) heart failure: Secondary | ICD-10-CM

## 2024-07-03 DIAGNOSIS — I10 Essential (primary) hypertension: Secondary | ICD-10-CM

## 2024-07-03 LAB — BASIC METABOLIC PANEL WITH GFR
Anion gap: 11 (ref 5–15)
BUN: 19 mg/dL (ref 6–20)
CO2: 25 mmol/L (ref 22–32)
Calcium: 9.3 mg/dL (ref 8.9–10.3)
Chloride: 104 mmol/L (ref 98–111)
Creatinine, Ser: 1.67 mg/dL — ABNORMAL HIGH (ref 0.61–1.24)
GFR, Estimated: 49 mL/min — ABNORMAL LOW (ref >60)
Glucose, Bld: 86 mg/dL (ref 70–99)
Potassium: 5.3 mmol/L — ABNORMAL HIGH (ref 3.5–5.1)
Sodium: 140 mmol/L (ref 135–145)

## 2024-07-03 MED ORDER — BLOOD PRESSURE MONITOR DEVI: 0 refills | Status: AC

## 2024-07-03 NOTE — Telephone Encounter (Signed)
 Complete

## 2024-07-06 ENCOUNTER — Telehealth (HOSPITAL_COMMUNITY): Payer: Self-pay | Admitting: *Deleted

## 2024-07-06 DIAGNOSIS — I5042 Chronic combined systolic (congestive) and diastolic (congestive) heart failure: Secondary | ICD-10-CM

## 2024-07-06 DIAGNOSIS — E875 Hyperkalemia: Secondary | ICD-10-CM

## 2024-07-06 NOTE — Telephone Encounter (Signed)
 Called patient and his wife with following lab results and instructions per Dr. Rolan:  Low K diet, make sure he is on no K supplement. Repeat BMET 2 wks.  Pt and wife verbalized understanding of same. Pt is not taking any potassium supplements. Potassium rich foods reviewed. Repeat lab ordered and scheduled.

## 2024-07-08 NOTE — Telephone Encounter (Addendum)
 Copied from CRM #[id number]. Topic: Clinical - Order For Equipment >> Jul 07, 2024 12:12 PM Berwyn MATSU wrote:    Reason for CRM: Patient and Caregiver Craig Schmidt called in to request incontinence supplies. Per patient he needs briefs and bed pads.  Per Patient and Craig Schmidt the prescription is going to run out and needs a new one.   May you please assist.

## 2024-07-10 NOTE — Telephone Encounter (Signed)
Pt has upcoming appointment to discuss 

## 2024-07-15 ENCOUNTER — Telehealth (HOSPITAL_COMMUNITY): Payer: Self-pay | Admitting: Cardiology

## 2024-07-15 ENCOUNTER — Other Ambulatory Visit (HOSPITAL_COMMUNITY): Payer: Self-pay

## 2024-07-15 NOTE — Telephone Encounter (Signed)
" ° °  Ok to start both.Please set up for EKG in 7-10 days.   Ezelle Surprenant NP-C  9:46 AM   "

## 2024-07-15 NOTE — Telephone Encounter (Signed)
 Pt aware Will add EKG to upcoming lab appt 12/30

## 2024-07-15 NOTE — Telephone Encounter (Signed)
 Pt called to report he was prescribe vistaril  and Seroquel  Wanted to confirm its ok to start with cardiac meds

## 2024-07-21 ENCOUNTER — Encounter: Payer: Self-pay | Admitting: Family

## 2024-07-21 ENCOUNTER — Ambulatory Visit (INDEPENDENT_AMBULATORY_CARE_PROVIDER_SITE_OTHER): Payer: MEDICAID | Admitting: Family

## 2024-07-21 VITALS — BP 125/86 | HR 70 | Temp 97.8°F | Resp 16

## 2024-07-21 DIAGNOSIS — Z993 Dependence on wheelchair: Secondary | ICD-10-CM | POA: Diagnosis not present

## 2024-07-21 DIAGNOSIS — R2689 Other abnormalities of gait and mobility: Secondary | ICD-10-CM | POA: Diagnosis not present

## 2024-07-21 DIAGNOSIS — R32 Unspecified urinary incontinence: Secondary | ICD-10-CM | POA: Diagnosis not present

## 2024-07-21 MED ORDER — CVS DISPOSABLE BED MATS MISC: 1.0000 | Freq: Every day | 11 refills | Status: DC | PRN

## 2024-07-21 MED ORDER — PROCARE ADULT BRIEFS LARGE MISC: 1.0000 | Freq: Every day | 11 refills | Status: DC | PRN

## 2024-07-21 NOTE — Progress Notes (Signed)
 "   Patient ID: Craig Schmidt, male    DOB: 27-Jul-1970  MRN: [id number]  CC: Follow-Up  Subjective: Craig Schmidt is a 53 y.o. male who presents for follow-up. He is accompanied by his significant other.   His concerns today include:  - States patient has been evaluated by [facility] and [employer]. States patient has been approved for a motorized wheelchair and needs office visit with Primary Care to note the need for the same. Patient confirms he has difficulty walking and would benefit from a motorized wheelchair.  - Requests shower chair.  - Urinary incontinence persisting. Denies red flag symptoms. Requests refills of urinary incontinence supplies and referral to specialist.   Patient Active Problem List   Diagnosis Date Noted   Hypertension, malignant 09/04/2023   HFrEF (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) 09/04/2023   Coronary artery disease 06/27/2023   History of CVA (cerebrovascular accident) 06/27/2023   Substance use disorder 06/27/2023   Atypical chest pain 06/21/2023   Pulmonary infiltrate 06/21/2023   Multifocal pneumonia 06/11/2023   Hyperkalemia 06/11/2023   Hyponatremia 06/11/2023   Normocytic anemia 06/11/2023   Thrombocytosis 06/11/2023   Troponin I above reference range 05/25/2023   CAP (community acquired pneumonia) 05/25/2023   Pneumonia 05/25/2023   Chronic combined systolic and diastolic CHF (congestive heart failure) (HCC) 12/03/2022   Malnutrition of moderate degree 11/30/2022   Acute on chronic combined systolic and diastolic CHF (congestive heart failure) (HCC) 11/29/2022   Acute on chronic combined systolic (congestive) and diastolic (congestive) heart failure (HCC) 11/28/2022   ACS (acute coronary syndrome) (HCC) 11/28/2022   Back pain 11/28/2022   Essential hypertension 11/28/2022   Hyperlipidemia 11/28/2022   Noncompliance with medication regimen 11/28/2022   ST elevation myocardial infarction (STEMI) of inferolateral  wall, subsequent episode of care (HCC) 12/19/2021   ST elevation myocardial infarction (STEMI) (HCC)    Ischemic cardiomyopathy    Cocaine  abuse (HCC)    Smoker    Alcohol  abuse    Acute CVA (cerebrovascular accident) (HCC) 09/08/2021   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   Tear of MCL (medial collateral ligament) of knee 05/20/2015   Protein-calorie malnutrition, severe 05/17/2015   Lactic acidosis 05/15/2015   Left knee pain 05/15/2015   Lung nodules 05/15/2015   Hypoglycemia 05/14/2015   Hypothermia 05/14/2015   Acute encephalopathy 05/14/2015   Cocaine  abuse with intoxication (HCC) 05/14/2015   Alcohol  intoxication in active alcoholic 05/14/2015   Sepsis (HCC) 05/14/2015   Homeless 05/14/2015     Medications Ordered Prior to Encounter[1]  Allergies[2]  Social History   Socioeconomic History   Marital status: Significant Other    Spouse name: Not on file   Number of children: 7   Years of education: Not on file   Highest education level: 9th grade  Occupational History   Occupation: unemployed    Comment: experiencing homelessness-living at daily rent motels.  Tobacco Use   Smoking status: Every Day    Current packs/day: 1.00    Average packs/day: 1 pack/day for 41.0 years (41.0 ttl pk-yrs)    Types: Cigarettes    Passive exposure: Current   Smokeless tobacco: Never  Vaping Use   Vaping status: Never Used  Substance and Sexual Activity   Alcohol  use: Yes    Alcohol /week: 63.0 standard drinks of alcohol     Types: 63 Cans of beer per week    Comment: 3-40oz beer/day x10 years. 1-2 40oz beer/day in 20s/30s   Drug use: Yes  Types: Crack cocaine , Cocaine     Comment: last use 01/13/22   Sexual activity: Not Currently  Other Topics Concern   Not on file  Social History Narrative   Not on file   Social Drivers of Health   Tobacco Use: High Risk (07/21/2024)   Patient History    Smoking Tobacco Use: Every Day    Smokeless Tobacco Use: Never     Passive Exposure: Current  Financial Resource Strain: Medium Risk (02/03/2024)   Overall Financial Resource Strain (CARDIA)    Difficulty of Paying Living Expenses: Somewhat hard  Food Insecurity: No Food Insecurity (07/21/2024)   Epic    Worried About Programme Researcher, Broadcasting/film/video in the Last Year: Never true    Ran Out of Food in the Last Year: Never true  Transportation Needs: No Transportation Needs (07/21/2024)   Epic    Lack of Transportation (Medical): No    Lack of Transportation (Non-Medical): No  Physical Activity: Not on file  Stress: No Stress Concern Present (02/03/2024)   [employer] of Occupational Health - Occupational Stress Questionnaire    Feeling of Stress: Not at all  Social Connections: Moderately Integrated (02/03/2024)   Social Connection and Isolation Panel    Frequency of Communication with Friends and Family: More than three times a week    Frequency of Social Gatherings with Friends and Family: More than three times a week    Attends Religious Services: More than 4 times per year    Active Member of [employer] or Organizations: No    Attends Banker Meetings: Never    Marital Status: Living with partner  Intimate Partner Violence: Not At Risk (09/04/2023)   Humiliation, Afraid, Rape, and Kick questionnaire    Fear of Current or Ex-Partner: No    Emotionally Abused: No    Physically Abused: No    Sexually Abused: No  Depression (PHQ2-9): Low Risk (07/21/2024)   Depression (PHQ2-9)    PHQ-2 Score: 0  Alcohol  Screen: Low Risk (02/03/2024)   Alcohol  Screen    Last Alcohol  Screening Score (AUDIT): 0  Housing: Low Risk (07/21/2024)   Epic    Unable to Pay for Housing in the Last Year: No    Number of Times Moved in the Last Year: 0    Homeless in the Last Year: No  Utilities: Not At Risk (07/21/2024)   Epic    Threatened with loss of utilities: No  Health Literacy: Inadequate Health Literacy (02/03/2024)   B1300 Health Literacy    Frequency of  need for help with medical instructions: Always    Family History  Problem Relation Age of Onset   Diabetes Mother     Past Surgical History:  Procedure Laterality Date   BRONCHIAL NEEDLE ASPIRATION BIOPSY  07/03/2023   Procedure: BRONCHIAL NEEDLE ASPIRATION BIOPSIES;  Surgeon: Claudene Toribio BROCKS, MD;  Location: [facility] ENDOSCOPY;  Service: Pulmonary;;   BRONCHIAL WASHINGS  07/03/2023   Procedure: BRONCHIAL WASHINGS;  Surgeon: Claudene Toribio BROCKS, MD;  Location: [facility] ENDOSCOPY;  Service: Pulmonary;;   BUBBLE STUDY  09/11/2021   Procedure: BUBBLE STUDY;  Surgeon: Okey Vina GAILS, MD;  Location: [facility] ENDOSCOPY;  Service: Cardiovascular;;   LEFT HEART CATH AND CORONARY ANGIOGRAPHY N/A 12/19/2021   Procedure: LEFT HEART CATH AND CORONARY ANGIOGRAPHY;  Surgeon: Claudene Victory ORN, MD;  Location: [facility] INVASIVE CV LAB;  Service: Cardiovascular;  Laterality: N/A;   RIGHT/LEFT HEART CATH AND CORONARY ANGIOGRAPHY N/A 04/13/2022   Procedure: RIGHT/LEFT HEART CATH AND CORONARY  ANGIOGRAPHY;  Surgeon: Rolan Ezra RAMAN, MD;  Location: [facility] INVASIVE CV LAB;  Service: Cardiovascular;  Laterality: N/A;   TEE WITHOUT CARDIOVERSION N/A 09/11/2021   Procedure: TRANSESOPHAGEAL ECHOCARDIOGRAM (TEE);  Surgeon: Okey Vina GAILS, MD;  Location: [facility] ENDOSCOPY;  Service: Cardiovascular;  Laterality: N/A;   VIDEO BRONCHOSCOPY WITH ENDOBRONCHIAL ULTRASOUND N/A 07/03/2023   Procedure: VIDEO BRONCHOSCOPY WITH ENDOBRONCHIAL ULTRASOUND;  Surgeon: Claudene Toribio BROCKS, MD;  Location: [facility] ENDOSCOPY;  Service: Pulmonary;  Laterality: N/A;    ROS: Review of Systems Negative except as stated above  PHYSICAL EXAM: BP 125/86 (BP Location: Right Arm)   Pulse 70   Temp 97.8 F (36.6 C) (Oral)   Resp 16   SpO2 98%   Physical Exam HENT:     Head: Normocephalic and atraumatic.     Nose: Nose normal.     Mouth/Throat:     Mouth: Mucous membranes are moist.     Pharynx: Oropharynx is clear.  Eyes:     Extraocular Movements: Extraocular movements intact.      Conjunctiva/sclera: Conjunctivae normal.     Pupils: Pupils are equal, round, and reactive to light.  Cardiovascular:     Rate and Rhythm: Normal rate and regular rhythm.     Pulses: Normal pulses.     Heart sounds: Normal heart sounds.  Pulmonary:     Effort: Pulmonary effort is normal.     Breath sounds: Normal breath sounds.  Musculoskeletal:        General: Normal range of motion.     Cervical back: Normal range of motion and neck supple.  Neurological:     General: No focal deficit present.     Mental Status: He is alert and oriented to person, place, and time.  Psychiatric:        Mood and Affect: Mood normal.        Behavior: Behavior normal.     ASSESSMENT AND PLAN: 1. Decreased mobility (Primary) 2. Dependence on wheelchair - Continue present management per Physical Therapy and [employer] for motorized wheelchair.  - Shower chair as prescribed.  - Follow-up with primary provider as scheduled.  - Shower chair  3. Urinary incontinence, unspecified type - Patient declined screening.  - Incontinence Supply Disposable Bed Mats and Incontinence Supply Disposable Adult Briefs as prescribed.  - Referral to Urology for evaluation/management.  - Follow-up with primary provider as scheduled.  - Ambulatory referral to Urology - Incontinence Supply Disposable ([employer] DISPOSABLE BED MATS) MISC; 1 each by Does not apply route daily as needed.  Dispense: 9 each; Refill: 11 - Incontinence Supply Disposable (PROCARE ADULT BRIEFS LARGE) MISC; 1 each by Does not apply route daily as needed.  Dispense: 25 each; Refill: 11   Patient was given the opportunity to ask questions.  Patient verbalized understanding of the plan and was able to repeat key elements of the plan. Patient was given clear instructions to go to Emergency Department or return to medical center if symptoms don't improve, worsen, or new problems develop.The patient verbalized understanding.   Orders Placed This Encounter   Procedures   Shower chair   Ambulatory referral to Urology     Requested Prescriptions   Signed Prescriptions Disp Refills   Incontinence Supply Disposable ([employer] DISPOSABLE BED MATS) MISC 9 each 11    Sig: 1 each by Does not apply route daily as needed.   Incontinence Supply Disposable (PROCARE ADULT BRIEFS LARGE) MISC 25 each 11    Sig: 1 each by Does not apply  route daily as needed.    Return for Follow-up as needed.  Greig JINNY Chute, NP      [1]  Current Outpatient Medications on File Prior to Visit  Medication Sig Dispense Refill   apixaban  (ELIQUIS ) 5 MG TABS tablet Take 1 tablet (5 mg total) by mouth 2 (two) times daily. 60 tablet 3   Blood Pressure Monitor DEVI Use as directed to check home blood pressure 2-3 times a week 1 each 0   carvedilol  (COREG ) 6.25 MG tablet Take 1 tablet (6.25 mg total) by mouth 2 (two) times daily. 60 tablet 3   cetirizine  (ZYRTEC ) 5 MG tablet TAKE 1 TABLET(5 MG) BY MOUTH DAILY 90 tablet 0   empagliflozin  (JARDIANCE ) 10 MG TABS tablet Take 1 tablet (10 mg total) by mouth daily. 30 tablet 5   furosemide  (LASIX ) 20 MG tablet Take 1 tablet (20 mg total) by mouth as needed. For weight gain of 3lb in 24 hours or 5 lb in a week     Incontinence Supplies (MALE URINAL) MISC 1 each by Does not apply route daily as needed. 1 each 2   sacubitril -valsartan  (ENTRESTO ) 49-51 MG Take 1 tablet by mouth 2 (two) times daily. 60 tablet 11   spironolactone  (ALDACTONE ) 25 MG tablet Take 0.5 tablets (12.5 mg total) by mouth daily. 45 tablet 3   triamcinolone  (KENALOG ) 0.025 % cream Apply 1 Application topically 2 (two) times daily. 454 g 2   docusate sodium  (COLACE) 100 MG capsule Take 1 capsule (100 mg total) by mouth daily as needed for mild constipation. (Patient not taking: Reported on 06/23/2024) 90 capsule 0   nicotine  polacrilex (NICORETTE ) 2 MG gum Take 1 each (2 mg total) by mouth as needed for smoking cessation. (Patient not taking: Reported on 06/23/2024) 110  tablet 0   oxyCODONE -acetaminophen  (PERCOCET) 5-325 MG tablet Take 1 tablet by mouth every 6 (six) hours as needed. (Patient not taking: Reported on 06/23/2024) 15 tablet 0   polyethylene glycol (MIRALAX  / GLYCOLAX ) 17 g packet Take 17 g by mouth daily as needed. (Patient not taking: Reported on 06/23/2024) 100 each 0   rosuvastatin  (CRESTOR ) 20 MG tablet Take 1 tablet (20 mg total) by mouth daily. (Patient not taking: Reported on 07/21/2024) 90 tablet 3   No current facility-administered medications on file prior to visit.  [2]  Allergies Allergen Reactions   Shellfish Allergy Anaphylaxis   Peanut (Diagnostic) Itching   "

## 2024-07-22 ENCOUNTER — Ambulatory Visit (HOSPITAL_COMMUNITY): Admission: RE | Admit: 2024-07-22 | Payer: MEDICAID | Source: Ambulatory Visit

## 2024-07-22 ENCOUNTER — Ambulatory Visit (HOSPITAL_COMMUNITY): Payer: MEDICAID

## 2024-07-27 ENCOUNTER — Telehealth: Payer: Self-pay

## 2024-07-27 NOTE — Telephone Encounter (Signed)
 Most recent OV notes have been faxed to [facility] and requested fax number

## 2024-07-27 NOTE — Progress Notes (Incomplete)
 ***In Progress***    Advanced Heart Failure Clinic Note   Primary Care: [name], NP HF Cardiologist: Dr. Rolan  HPI:  Craig Schmidt is a 54 y.o. male with history of cocaine  abuse/ETOH abuse, hx L MCA CVA in 08/2021 s/p tenecteplase , left vestibular schwannoma, chronic systolic CHF and mitral valve stenosis.   Admitted 08/2021 with left MCA CVA s/p tenecteplase . Stroke felt to be likely secondary to cardiomyopathy. Echo EF 25-30%, RV mildly reduced, RVSP 50 mmHg, moderate MS, mild to moderate MR, moderate to severe TR, dilated IVC. TEE 08/2021: EF 25-30%, RV severely reduced, MV stenotic, MVA 1.45 cm2 consistent with severe MS, moderate MR, no interatrial shunt, no LV or LAA thrombus. Cardiology consulted. CM likely d/t substance abuse +/- uncontrolled HTN. Had been started on coumadin  but later switched to eliquis  d/t concern for compliance given his social situation.   Admitted 11/2021 with acute inferolateral STEMI. Had used cocaine  24 hrs prior to presentation. Emergent LHC with 100% apical LAD suspected embolic vs plaque rupture with thrombosis. LVEDP 40. Echo this admit showed EF < 20%, ? RV okay. He was significantly volume overloaded and hypertensive. Started on lasix  gtt and nitro gtt. Had good diuresis but Scr began to trend up, 1.5>1.77>1.87>2.0.  Developed soft BP and BP-active meds held. There was concern for low-output. PICC placed and Co-ox within normal range. Drips weaned and GDMT started. He was discharged home, weight 156 lbs.   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    R/LHC (03/2022) arranged to assess mitral stenosis, which showed primarily pulmonary venous hypertension, normal RA pressure and minimally elevated PCWP, and moderate mitral stenosis.    Admitted 11/2022 with CHF, 2/2 running out of meds x  several weeks. Echo showed EF 25-30%, RV mildly reduced, mild MR and mild MS. Diuresed with IV lasix  and GDMT titrated. Discharged home, weight 138 lbs.   Echo in 06/2023 showed EF < 20%, mild-moderate LV dilation, moderate RV dysfunction/mild RV enlargement, severe LAE, moderate mitral stenosis with restricted leaflets.    Patient had a recurrent CVA in 08/2023, right parietal area due to right MCA branch occlusion.    Patient returns for CHF followup.  He has significant residual effects from his prior CVAs with weakness in both arms.  He is now in an apartment with his girlfriend.  He is able to walk some in the apartment but is weak and unstable and his legs lock up.  He is unable to manage a cane or walker due to arm weakness and uses a wheelchair when he leaves his appt. Still using cocaine , but cutting back.  Last use about 2 weeks ago.  No dyspnea walking around apartment but he tires easily. No chest pain.  No lightheadedness or palpitations.  Trying to stay away from sodium. Weight is up but he has been eating better.     Today he returns to HF clinic for pharmacist medication titration. At last visit with MD, Crestor  was increased to 20 mg daily, carvedilol  was increased to 6.25 mg BID and spironolactone  was increased to 25 mg daily. However, potassium level returned at 5.4 after visit so patient was instructed to NOT increase spironolactone  and continue on 12.5 mg daily in addition to following a low potassium diet and discontinuing any potassium supplements. Since that time, he presented to the ED on 07/31/24 with  fever, throat pain and nasal congestion. He was negative for flu and strep and was told he most likely had a virus.   Overall feeling ***. Dizziness, lightheadedness, fatigue:  Chest pain or palpitations:  How is your breathing?: *** SOB: Able to complete all ADLs. Activity level ***  Weight at home pounds. Takes furosemide /torsemide /bumex *** mg *** daily.   LEE PND/Orthopnea  Appetite *** Low-salt diet:   Physical Exam Cost/affordability of meds  -in facility and using wheelchair -wear mask - virus (ED 1/9) -bmet to assess K level + EKG and cancel 1/20 lab if return normal >>see if has started seroqel/hydroxyzine  or not -coreg  3.125 filled 1/5 > did he keept switch? -inc coreg  (see if inc last time or not) vs inc entresto  (need to wait until labs return probably) - has echo 1/20 and APP 2/2  He has had multiple daily anxiety attacks for about a week, each lasting about ten minutes, which feel overwhelming. He has had suicidal thoughts for about a month without a specific plan, and he relies on his girlfriend for support.   He sleeps only two to three hours per night. He was prescribed Seroquel but has not started it because he is worried about interactions with his heart medications and is waiting for confirmation that it is safe. He also has hydroxyzine  prescribed but has not used it.   Shortness of breath/dyspnea on exertion? {YES NO:22349}  Orthopnea/PND? {YES NO:22349} Edema? {YES NO:22349} Lightheadedness/dizziness? {YES NO:22349} Daily weights at home? {YES NO:22349} Blood pressure/heart rate monitoring at home? {YES [id number] Following low-sodium/fluid-restricted diet? {YES NO:22349}  HF Medications: Carvedilol  6.25 mg BID Entresto  49/51 mg BID Spironolactone  12.5 mg daily Jardiance  10 mg daily Lasix  20 mg PRN  Has the patient been experiencing any side effects to the medications prescribed?  {YES NO:22349}  Does the patient have any problems obtaining medications due to transportation or finances?   {YES NO:22349}  Understanding of regimen: {excellent/good/fair/poor:19665} Understanding of indications: {excellent/good/fair/poor:19665} Potential of compliance: {excellent/good/fair/poor:19665} Patient understands to avoid NSAIDs. Patient understands to avoid decongestants.    Pertinent Lab Values: Serum creatinine  ***, BUN ***, Potassium ***, Sodium ***, BNP ***, Magnesium  ***, Digoxin  ***   Vital Signs: Weight: *** (last clinic weight: ***) Blood pressure: ***  Heart rate: ***   Assessment/Plan: . Chronic systolic CHF: Patient was found to have cardiomyopathy in 08/2021 at time of admission for CVA.  Echo showed EF 25-30% at that time.  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  MI in 11/2021 (?cardioembolic) also contributes.  Echo in 11/2021 showed LV EF < 20%, RV mildly decreased function, abnormal mitral valve looks rheumatic => suspect moderate mitral stenosis at least with planimetered MVA 1.6 cm^2 and calculated MVA by VTI 0.8 cm^2; mean gradient only 6 mmHg but likely function of low cardiac output. Echo in 06/2023 showed EF < 20%, mild-moderate LV dilation, moderate RV dysfunction/mild RV enlargement, severe LAE, moderate mitral stenosis with restricted leaflets. NYHA class II but symptoms confounded by weakness/unsteadiness from prior CVAs.  He is not volume overloaded on exam.  - Continue Lasix  20 mg PRN.   - Continue carvedilol  6.25 mg BID.   - Continue Entresto  49/51 mg BID.  - Continue spironolactone  12.5 mg daily - Continue Jardiance  10 mg daily. - If he can get off cocaine , would be ICD candidate (narrow QRS so no CRT).  - Not candidate for advanced therapies with debility and active cocaine  use.  2. CAD: Admit 12/2021 with inferolateral STEMI by ECG, cath showed occluded apical LAD.  Cannot rule out plaque rupture in setting of cocaine  abuse, but given history of suspected cardioembolic CVA, wonder if this was not a cardioembolic MI. No chest pain.   - No ASA with Eliquis  use.  - Continue Crestor  20 mg daily 3. HTN: BP controlled.***   4. CVA: 08/2021 and again in 08/2023, has both right and left arm weakness.  Thought to be cardioembolic, started on anticoagulation (was off Eliquis  when he had the 2nd CVA).   - Continue Eliquis  5 mg  BID. 5. Cocaine  abuse: Ongoing use. Discussed cessation. He is trying to cut back.  6. 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   Echo 03/2022 shows moderate MS with trivial MR. Unusual given age. He denies known rheumatic fever though he remembers have strep throat at least twice as a child. L/RHC (03/2022) showed moderate mitral stenosis, with mitral valve mean gradient 10.9 mmHg, MVA 1.74 cm^2.  Echo in 06/2023 also showed moderate mitral stenosis.  - He would not be a candidate for surgical mitral valve replacement.  - Despite history of moderate mitral stenosis, patient is taking Eliquis  instead of warfarin. This is related to noncompliance with warfarin and decision was made that benefit outweighs risk at this time. Consider reevaluating in the future if compliance improves and/or mitral stenosis worsens. 7. Vestibular schwannoma: Has had vertigo and falls. No falls recently.   Follow up ***   Tinnie Redman, PharmD, BCPS, BCCP, CPP Heart Failure Clinic Pharmacist [phone]

## 2024-07-27 NOTE — Telephone Encounter (Signed)
 Copied from CRM [id number]. Topic: General - Other >> Jul 27, 2024 11:08 AM Myrick T wrote: Reason for CRM: Damien from [employer] called to get office notes from previous visits per the request for the power wheelchair. Notes can be faxed attn: Damien but if there are additional questions please f/u with Damien at [phone]

## 2024-07-28 DIAGNOSIS — R32 Unspecified urinary incontinence: Secondary | ICD-10-CM

## 2024-07-28 NOTE — Telephone Encounter (Signed)
Resent to new fax number.

## 2024-07-28 NOTE — Telephone Encounter (Unsigned)
 Copied from CRM [id number]. Topic: Clinical - Order For Equipment >> Jul 28, 2024 11:16 AM Myrick T wrote: Reason for CRM: patients girlfriend Therisa called to get info on Ov notes for patient to get a back bench for the shower from [employer]. Please advise.

## 2024-07-28 NOTE — Telephone Encounter (Signed)
 Called and advised pt and gf that I faxed the OV notes over to [employer] yesterday.

## 2024-07-28 NOTE — Telephone Encounter (Signed)
 Copied from CRM [id number]. Topic: Clinical - Medication Refill >> Jul 28, 2024 12:37 PM Jayma L wrote: Medication: Incontinence Supply Disposable (PROCARE ADULT BRIEFS LARGE) MISc Incontinence Supply Disposable ([employer] DISPOSABLE BED MATS) MISC  Has the patient contacted their pharmacy? Yes (Agent: If no, request that the patient contact the pharmacy for the refill. If patient does not wish to contact the pharmacy document the reason why and proceed with request.) (Agent: If yes, when and what did the pharmacy advise?)  This is the patient's preferred pharmacy:  [facility] Phone number is [phone]    Is this the correct pharmacy for this prescription? Yes If no, delete pharmacy and type the correct one.   Has the prescription been filled recently? No  Is the patient out of the medication? No  Has the patient been seen for an appointment in the last year OR does the patient have an upcoming appointment? Yes  Can we respond through MyChart? No  Agent: Please be advised that Rx refills may take up to 3 business days. We ask that you follow-up with your pharmacy.

## 2024-07-28 NOTE — Telephone Encounter (Signed)
 Craig Schmidt the patients girlfriend called back stating [employer] never received faxed office notes so he can get the wheelchair and are requesting for them to be resent to fax number [phone]. Please assist patient further

## 2024-07-29 ENCOUNTER — Ambulatory Visit: Payer: Self-pay

## 2024-07-29 NOTE — Telephone Encounter (Signed)
" °  FYI Only or Action Required?: Action required by provider: request for appointment.  Patient was last seen in primary care on 07/21/2024 by [name], NP.  Called Nurse Triage reporting Anxiety.  Symptoms began several weeks ago.  Interventions attempted: Nothing.  Symptoms are: rapidly worsening.  Triage Disposition: See Physician Within 24 Hours  Patient/caregiver understands and will follow disposition?:    Copied from CRM #[id number]. Topic: Clinical - Red Word Triage >> Jul 29, 2024  3:56 PM Wess RAMAN wrote: Red Word that prompted transfer to Nurse Triage: Brittany, patient's peer support worker, stated patient needs to see a therapist. He needs an appt with PCP.  Mental issues: Overwhelming with life, really depressed, anxiety through the roof. Reason for Disposition  Patient sounds very upset or troubled to the triager  Answer Assessment - Initial Assessment Questions Unable to make any sooner appt at another office.  Next available appt in office is 3 wks   1. CONCERN: Did anything happen that prompted you to call today?      Feeling overwhelmed 2. ANXIETY SYMPTOMS: Can you describe how you (your loved one; patient) have been feeling? (e.g., tense, restless, panicky, anxious, keyed up, overwhelmed, sense of impending doom).      Anxiety, feelings of hurting himself 3. ONSET: How long have you been feeling this way? (e.g., hours, days, weeks)     Few weeks 4. SEVERITY: How would you rate the level of anxiety? (e.g., 0 - 10; or mild, moderate, severe).     severe 5. FUNCTIONAL IMPAIRMENT: How have these feelings affected your ability to do daily activities? Have you had more difficulty than usual doing your normal daily activities? (e.g., getting better, same, worse; self-care, school, work, interactions)     yes 6. HISTORY: Have you felt this way before? Have you ever been diagnosed with an anxiety problem in the past? (e.g., generalized anxiety  disorder, panic attacks, PTSD). If Yes, ask: How was this problem treated? (e.g., medicines, counseling, etc.)      7. RISK OF HARM - SUICIDAL IDEATION: Do you ever have thoughts of hurting or killing yourself? If Yes, ask:  Do you have these feelings now? Do you have a plan on how you would do this?     Has had thoughts of hurting himself, has no plan. Girlfriend is at side.  8. TREATMENT:  What has been done so far to treat this anxiety? (e.g., medicines, relaxation strategies). What has helped?      9. THERAPIST: Do you have a counselor or therapist? If Yes, ask: What is their name?     no 10. POTENTIAL TRIGGERS: Do you drink caffeinated beverages (e.g., coffee, colas, teas), and how much daily? Do you drink alcohol  or use any drugs? Have you started any new medicines recently?        11. PATIENT SUPPORT: Who is with you now? Who do you live with? Do you have family or friends who you can talk to?        Yes has support system in the home 12. OTHER SYMPTOMS: Do you have any other symptoms? (e.g., feeling depressed, trouble concentrating, trouble sleeping, trouble breathing, palpitations or fast heartbeat, chest pain, sweating, nausea, or diarrhea)       depression  Protocols used: Anxiety and Panic Attack-A-AH  "

## 2024-07-29 NOTE — Telephone Encounter (Addendum)
 Spoke with patient and was informed that this nurse would call tomorrow to do a MyChart appointment with the [facility] provider tomorrow.  [facility] location is in [place] on 07/30/2024.   Patient was asked he felt like he would hurt or harm himself. Denies. SI/HI. He states his girlfriend is with him, she is is his support.  Advised patient to go to ED if he felt mood was changing for worse.   Patient verbalized understanding.  Patient and girlfriend grateful for the call.

## 2024-07-30 ENCOUNTER — Telehealth: Payer: Self-pay | Admitting: Family

## 2024-07-30 ENCOUNTER — Telehealth: Payer: MEDICAID | Admitting: Physician Assistant

## 2024-07-30 ENCOUNTER — Telehealth: Payer: Self-pay | Admitting: Emergency Medicine

## 2024-07-30 ENCOUNTER — Encounter: Payer: Self-pay | Admitting: Physician Assistant

## 2024-07-30 DIAGNOSIS — F411 Generalized anxiety disorder: Secondary | ICD-10-CM | POA: Diagnosis not present

## 2024-07-30 NOTE — Telephone Encounter (Signed)
 Copied from CRM #[id number]. Topic: Referral - Status >> Jul 30, 2024  9:34 AM Antwanette L wrote: Reason for RMF:Xmpdupj with [facility] called to check whether the provider has submitted referral for dermatology and colonoscopy. She also stated that the patient needs a neuropsychology evaluations to determine eligibility for independent living. Lila is requesting a callback at [phone]

## 2024-07-30 NOTE — Telephone Encounter (Signed)
 Report to Emergency Department/Urgent Care/call 911 for immediate medical evaluation. Follow-up with Primary Care.

## 2024-07-30 NOTE — Patient Instructions (Addendum)
" °  Curtistine SAUNDERS Hoke, thank you for joining Kirk GORMAN Sage, [name] for today's virtual visit.  While this provider is not your primary care provider (PCP), if your PCP is located in our provider database this encounter information will be shared with them immediately following your visit.    Other Instructions VISIT SUMMARY:  Today, you were seen for anxiety and depression. You reported experiencing multiple daily anxiety attacks, disrupted sleep, and suicidal thoughts. We discussed your concerns about starting new medications due to potential interactions with your heart medications.  YOUR PLAN:  -MAJOR DEPRESSIVE DISORDER WITH ANXIETY AND SUICIDAL IDEATION: Major depressive disorder with anxiety and suicidal ideation means you are experiencing severe depression along with anxiety and thoughts of self-harm. We recommend starting Seroquel at bedtime to help with sleep and mood stabilization. Additionally, you can use hydroxyzine  during the day, starting with half a tablet, to help manage anxiety. We have also provided information on [facility] for immediate support.  [facility] [address], [place], [place] [address] [phone] or [phone] Walk-in urgent care 24/7 for anyone  For [facility] ONLY New patient assessments and therapy walk-ins: Monday and Wednesday 8am-11am First and second Friday 1pm-5pm New patient psychiatry and medication management walk-ins: Mondays, Wednesdays, Thursdays, Fridays 8am-11am No psychiatry walk-ins on Tuesday   INSTRUCTIONS:  Please start taking Seroquel at bedtime and use hydroxyzine  during the day as needed. We will have a follow-up video visit in one week to assess how the medications are working for you. If you need immediate support, please contact [facility].   "

## 2024-07-30 NOTE — Telephone Encounter (Unsigned)
 Copied from CRM [id number]. Topic: Clinical - Order For Equipment >> Jun 30, 2024  1:42 PM Zy'onna H wrote: Reason for CRM: Nurse called in from the facility in which the patient is stating.  He has place in a request for the following DME equipment:  Nurse callback number: [phone] - Blood Pressure Cuff

## 2024-07-30 NOTE — Telephone Encounter (Signed)
 Copied from CRM [id number]. Topic: Clinical - Order For Equipment >> Jun 30, 2024  1:42 PM Zy'onna H wrote: Reason for CRM: Nurse called in from the facility in which the patient is stating.  He has place in a request for the following DME equipment:  Nurse callback number: [phone] - Blood Pressure Cuff

## 2024-07-30 NOTE — Progress Notes (Signed)
 " Virtual Visit Consent   Craig Schmidt, you are scheduled for a virtual visit with a [facility] provider today. Just as with appointments in the office, your consent must be obtained to participate. Your consent will be active for this visit and any virtual visit you may have with one of our providers in the next 365 days. If you have a MyChart account, a copy of this consent can be sent to you electronically.  As this is a virtual visit, video technology does not allow for your provider to perform a traditional examination. This may limit your provider's ability to fully assess your condition. If your provider identifies any concerns that need to be evaluated in person or the need to arrange testing (such as labs, EKG, etc.), we will make arrangements to do so. Although advances in technology are sophisticated, we cannot ensure that it will always work on either your end or our end. If the connection with a video visit is poor, the visit may have to be switched to a telephone visit. With either a video or telephone visit, we are not always able to ensure that we have a secure connection.  By engaging in this virtual visit, you consent to the provision of healthcare and authorize for your insurance to be billed (if applicable) for the services provided during this visit. Depending on your insurance coverage, you may receive a charge related to this service.  I need to obtain your verbal consent now. Are you willing to proceed with your visit today? [name] has provided verbal consent on 07/30/2024 for a virtual visit (video or telephone). Kirk GORMAN Sage, [name]  Date: 07/30/2024 1:28 PM   Virtual Visit via Video Note   I, Craig Schmidt, connected with  Craig Schmidt  ([id number], 52/27/1972) on 07/30/2024 at  1:20 PM EST by a video-enabled telemedicine application and verified that I am speaking with the correct person using two identifiers.  Location: Patient: Virtual Visit Location  Patient: Home Provider: Virtual Visit Location Provider: [facility] Medicine [facility]    I discussed the limitations of evaluation and management by telemedicine and the availability of in person appointments. The patient expressed understanding and agreed to proceed.    History of Present Illness: Craig Schmidt is a 54 y.o. who identifies as a male who was assigned male at birth,  who presents with anxiety and depression. He is accompanied by his girlfriend, who is his support system.  He has had multiple daily anxiety attacks for about a week, each lasting about ten minutes, which feel overwhelming. He has had suicidal thoughts for about a month without a specific plan, and he relies on his girlfriend for support.  He sleeps only two to three hours per night. He was prescribed Seroquel but has not started it because he is worried about interactions with his heart medications and is waiting for confirmation that it is safe. He also has hydroxyzine  prescribed but has not used it.   Problems:  Patient Active Problem List   Diagnosis Date Noted   Hypertension, malignant 09/04/2023   HFrEF (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) 09/04/2023   Coronary artery disease 06/27/2023   History of CVA (cerebrovascular accident) 06/27/2023   Substance use disorder 06/27/2023   Atypical chest pain 06/21/2023   Pulmonary infiltrate 06/21/2023   Multifocal pneumonia 06/11/2023   Hyperkalemia 06/11/2023   Hyponatremia 06/11/2023   Normocytic anemia 06/11/2023   Thrombocytosis 06/11/2023   Troponin  I above reference range 05/25/2023   CAP (community acquired pneumonia) 05/25/2023   Pneumonia 05/25/2023   Chronic combined systolic and diastolic CHF (congestive heart failure) (HCC) 12/03/2022   Malnutrition of moderate degree 11/30/2022   Acute on chronic combined systolic and diastolic CHF (congestive heart failure) (HCC) 11/29/2022   Acute on chronic combined systolic  (congestive) and diastolic (congestive) heart failure (HCC) 11/28/2022   ACS (acute coronary syndrome) (HCC) 11/28/2022   Back pain 11/28/2022   Essential hypertension 11/28/2022   Hyperlipidemia 11/28/2022   Noncompliance with medication regimen 11/28/2022   ST elevation myocardial infarction (STEMI) of inferolateral wall, subsequent episode of care (HCC) 12/19/2021   ST elevation myocardial infarction (STEMI) (HCC)    Ischemic cardiomyopathy    Cocaine  abuse (HCC)    Smoker    Alcohol  abuse    Acute CVA (cerebrovascular accident) (HCC) 09/08/2021   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   Tear of MCL (medial collateral ligament) of knee 05/20/2015   Protein-calorie malnutrition, severe 05/17/2015   Lactic acidosis 05/15/2015   Left knee pain 05/15/2015   Lung nodules 05/15/2015   Hypoglycemia 05/14/2015   Hypothermia 05/14/2015   Acute encephalopathy 05/14/2015   Cocaine  abuse with intoxication (HCC) 05/14/2015   Alcohol  intoxication in active alcoholic 05/14/2015   Sepsis (HCC) 05/14/2015   Homeless 05/14/2015    Allergies: Allergies[1] Medications: Current Medications[2]  Observations/Objective: Patient is well-developed, well-nourished in no acute distress.  Resting comfortably  at home.  Head is normocephalic, atraumatic.  No labored breathing.  Speech is clear and coherent with logical content.  Patient is alert and oriented at baseline.    Assessment and Plan: 1. 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 Reports suicidal thoughts without a specific plan. Concerns about medication interactions with heart medications. - Start Seroquel at bedtime. - Use hydroxyzine  during the day, starting with half a tablet. - Follow-up video visit in one week to assess medication efficacy. - Provided information on [facility] for immediate support.  Follow Up Instructions: I discussed the assessment and treatment plan with the patient. The patient was provided an opportunity to ask questions and all were answered. The patient agreed with the plan and demonstrated an understanding of the instructions.  A copy of instructions were sent to the patient via MyChart unless otherwise noted below.     The patient was advised to call back or seek an in-person evaluation if the symptoms worsen or if the condition fails to improve as anticipated.    Avinash Maltos S Mayers, [name]     [1]  Allergies Allergen Reactions   Shellfish Allergy Anaphylaxis   Peanut (Diagnostic) Itching  [2]  Current Outpatient Medications:    apixaban  (ELIQUIS ) 5 MG TABS tablet, Take 1 tablet (5 mg total) by mouth 2 (two) times daily., Disp: 60 tablet, Rfl: 3   Blood Pressure Monitor DEVI, Use as directed to check home blood pressure 2-3 times a week, Disp: 1 each, Rfl: 0   carvedilol  (COREG ) 6.25 MG tablet, Take 1 tablet (6.25 mg total) by mouth 2 (two) times daily., Disp: 60 tablet, Rfl: 3   cetirizine  (ZYRTEC ) 5 MG tablet, TAKE 1 TABLET(5 MG) BY MOUTH DAILY, Disp: 90 tablet, Rfl: 0   docusate sodium  (COLACE) 100 MG capsule, Take 1 capsule (100 mg total) by mouth daily as needed for mild constipation. (Patient not taking: Reported on 06/23/2024), Disp: 90  capsule, Rfl: 0   empagliflozin  (JARDIANCE ) 10 MG TABS tablet, Take 1 tablet (10 mg total) by mouth daily., Disp: 30 tablet, Rfl: 5   furosemide  (LASIX ) 20 MG tablet, Take 1 tablet (20 mg total) by mouth as needed. For weight gain of 3lb in 24 hours or 5 lb in a week, Disp: , Rfl:    Incontinence Supplies (MALE URINAL) MISC, 1 each by Does not apply route daily as needed., Disp: 1 each, Rfl: 2   Incontinence Supply Disposable ([employer] DISPOSABLE BED MATS) MISC, 1 each by Does not apply route daily as needed., Disp: 9 each, Rfl: 11   Incontinence Supply Disposable (PROCARE ADULT BRIEFS  LARGE) MISC, 1 each by Does not apply route daily as needed., Disp: 25 each, Rfl: 11   nicotine  polacrilex (NICORETTE ) 2 MG gum, Take 1 each (2 mg total) by mouth as needed for smoking cessation. (Patient not taking: Reported on 06/23/2024), Disp: 110 tablet, Rfl: 0   oxyCODONE -acetaminophen  (PERCOCET) 5-325 MG tablet, Take 1 tablet by mouth every 6 (six) hours as needed. (Patient not taking: Reported on 06/23/2024), Disp: 15 tablet, Rfl: 0   polyethylene glycol (MIRALAX  / GLYCOLAX ) 17 g packet, Take 17 g by mouth daily as needed. (Patient not taking: Reported on 06/23/2024), Disp: 100 each, Rfl: 0   rosuvastatin  (CRESTOR ) 20 MG tablet, Take 1 tablet (20 mg total) by mouth daily. (Patient not taking: Reported on 07/21/2024), Disp: 90 tablet, Rfl: 3   sacubitril -valsartan  (ENTRESTO ) 49-51 MG, Take 1 tablet by mouth 2 (two) times daily., Disp: 60 tablet, Rfl: 11   spironolactone  (ALDACTONE ) 25 MG tablet, Take 0.5 tablets (12.5 mg total) by mouth daily., Disp: 45 tablet, Rfl: 3   triamcinolone  (KENALOG ) 0.025 % cream, Apply 1 Application topically 2 (two) times daily., Disp: 454 g, Rfl: 2  "

## 2024-07-30 NOTE — Telephone Encounter (Signed)
 Copied from CRM [id number]. Topic: Clinical - Order For Equipment >> Jul 30, 2024 10:29 AM Gustabo D wrote: [facility]- adult diapers and bed pads pt needs a new prescription  Wants it delivered.

## 2024-07-30 NOTE — Telephone Encounter (Signed)
 Patient has virtual appointment with us  today at 120

## 2024-07-31 ENCOUNTER — Other Ambulatory Visit: Payer: Self-pay | Admitting: Family

## 2024-07-31 ENCOUNTER — Telehealth: Payer: Self-pay | Admitting: Family

## 2024-07-31 ENCOUNTER — Ambulatory Visit
Admission: EM | Admit: 2024-07-31 | Discharge: 2024-07-31 | Disposition: A | Discharge status: Home / Self Care | Payer: MEDICAID

## 2024-07-31 ENCOUNTER — Encounter: Payer: Self-pay | Admitting: Emergency Medicine

## 2024-07-31 DIAGNOSIS — R21 Rash and other nonspecific skin eruption: Secondary | ICD-10-CM

## 2024-07-31 DIAGNOSIS — J069 Acute upper respiratory infection, unspecified: Secondary | ICD-10-CM

## 2024-07-31 DIAGNOSIS — R32 Unspecified urinary incontinence: Secondary | ICD-10-CM

## 2024-07-31 DIAGNOSIS — F439 Reaction to severe stress, unspecified: Secondary | ICD-10-CM

## 2024-07-31 DIAGNOSIS — Z0130 Encounter for examination of blood pressure without abnormal findings: Secondary | ICD-10-CM

## 2024-07-31 LAB — POCT INFLUENZA A/B
Influenza A, POC: NEGATIVE
Influenza B, POC: NEGATIVE

## 2024-07-31 LAB — POCT RAPID STREP A (OFFICE): Rapid Strep A Screen: NEGATIVE

## 2024-07-31 MED ORDER — BLOOD PRESSURE MONITOR DEVI: 0 refills | Status: AC

## 2024-07-31 MED ORDER — CVS DISPOSABLE BED MATS MISC: 1.0000 | Freq: Every day | 11 refills | Status: AC | PRN

## 2024-07-31 MED ORDER — PROCARE ADULT BRIEFS LARGE MISC: 1.0000 | Freq: Every day | 11 refills | Status: AC | PRN

## 2024-07-31 NOTE — Telephone Encounter (Signed)
 I called Lila and made her aware that his b/p monitor was sent in and his referral was sent in as well

## 2024-07-31 NOTE — Telephone Encounter (Signed)
 A document form from [employer] has been faxed: review of attached pt/ot evaluation, [place] certificate of medical necessity, and standard written order, to be filled out by provider. Send document back via Fax within 7-days to 14days. Document is located in providers tray at front office.          Fax number: [phone]

## 2024-07-31 NOTE — Telephone Encounter (Signed)
 Complete

## 2024-07-31 NOTE — ED Triage Notes (Signed)
 Pt reports sore throat, fever, and nasal congestion that started yesterday. Unsure of max temp as no thermometer at home. No sick contacts. No med use for symptoms.

## 2024-07-31 NOTE — Telephone Encounter (Signed)
 Blood pressure monitor prescribed (07/31/2024 10:11 AM EST).

## 2024-07-31 NOTE — Discharge Instructions (Signed)
 Pt is neg for flu and strep. Your symptoms are most likely due to virus.   Due to your history of high blood pressure Coricidin or other OTC cold meds that are approved for people with high blood pressure are best for your symptoms.

## 2024-07-31 NOTE — ED Provider Notes (Signed)
 " [facility]    CSN: [id number] Arrival date & time: 07/31/24  1245      History   Chief Complaint Chief Complaint  Patient presents with   Fever   Nasal Congestion   Sore Throat    HPI BECKY BERBERIAN is a 54 y.o. male.   Pt presents today due to subjective fever, throat pain, and nasal congestion that started yesterday. Pt states that he has not taken any thing for his symptoms and no known sick contacts.    The history is provided by the patient.  Fever Sore Throat    Past Medical History:  Diagnosis Date   Alcohol  abuse    Asthma    Brain tumor (HCC)    CAD (coronary artery disease)    Chronic HFrEF (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)    Chronic kidney disease, stage 3 (HCC)    Cocaine  abuse (HCC)    History of medication noncompliance    Homelessness    MI (myocardial infarction) (HCC)    STEMI (ST elevation myocardial infarction) (HCC)    Stroke [facility])     Patient Active Problem List   Diagnosis Date Noted   Hypertension, malignant 09/04/2023   HFrEF (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) 09/04/2023   Coronary artery disease 06/27/2023   History of CVA (cerebrovascular accident) 06/27/2023   Substance use disorder 06/27/2023   Atypical chest pain 06/21/2023   Pulmonary infiltrate 06/21/2023   Multifocal pneumonia 06/11/2023   Hyperkalemia 06/11/2023   Hyponatremia 06/11/2023   Normocytic anemia 06/11/2023   Thrombocytosis 06/11/2023   Troponin I above reference range 05/25/2023   CAP (community acquired pneumonia) 05/25/2023   Pneumonia 05/25/2023   Chronic combined systolic and diastolic CHF (congestive heart failure) (HCC) 12/03/2022   Malnutrition of moderate degree 11/30/2022   Acute on chronic combined systolic and diastolic CHF (congestive heart failure) (HCC) 11/29/2022   Acute on chronic combined systolic (congestive) and diastolic (congestive) heart failure (HCC) 11/28/2022   ACS (acute  coronary syndrome) (HCC) 11/28/2022   Back pain 11/28/2022   Essential hypertension 11/28/2022   Hyperlipidemia 11/28/2022   Noncompliance with medication regimen 11/28/2022   ST elevation myocardial infarction (STEMI) of inferolateral wall, subsequent episode of care (HCC) 12/19/2021   ST elevation myocardial infarction (STEMI) (HCC)    Ischemic cardiomyopathy    Cocaine  abuse (HCC)    Smoker    Alcohol  abuse    Acute CVA (cerebrovascular accident) (HCC) 09/08/2021   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   Tear of MCL (medial collateral ligament) of knee 05/20/2015   Protein-calorie malnutrition, severe 05/17/2015   Lactic acidosis 05/15/2015   Left knee pain 05/15/2015   Lung nodules 05/15/2015   Hypoglycemia 05/14/2015   Hypothermia 05/14/2015   Acute encephalopathy 05/14/2015   Cocaine  abuse with intoxication (HCC) 05/14/2015   Alcohol  intoxication in active alcoholic 05/14/2015   Sepsis (HCC) 05/14/2015   Homeless 05/14/2015    Past Surgical History:  Procedure Laterality Date   BRONCHIAL NEEDLE ASPIRATION BIOPSY  07/03/2023   Procedure: BRONCHIAL NEEDLE ASPIRATION BIOPSIES;  Surgeon: Claudene Toribio BROCKS, MD;  Location: [facility] ENDOSCOPY;  Service: Pulmonary;;   BRONCHIAL WASHINGS  07/03/2023   Procedure: BRONCHIAL WASHINGS;  Surgeon: Claudene Toribio BROCKS, MD;  Location: [facility] ENDOSCOPY;  Service: Pulmonary;;   BUBBLE STUDY  09/11/2021   Procedure: BUBBLE STUDY;  Surgeon: Okey Vina GAILS, MD;  Location: [facility] ENDOSCOPY;  Service: Cardiovascular;;   LEFT HEART CATH AND CORONARY ANGIOGRAPHY N/A 12/19/2021  Procedure: LEFT HEART CATH AND CORONARY ANGIOGRAPHY;  Surgeon: Claudene Victory ORN, MD;  Location: [facility] INVASIVE CV LAB;  Service: Cardiovascular;  Laterality: N/A;   RIGHT/LEFT HEART CATH AND CORONARY ANGIOGRAPHY N/A 04/13/2022   Procedure: RIGHT/LEFT HEART CATH AND CORONARY ANGIOGRAPHY;  Surgeon: Rolan Ezra RAMAN, MD;  Location: [facility] INVASIVE CV LAB;  Service: Cardiovascular;  Laterality:  N/A;   TEE WITHOUT CARDIOVERSION N/A 09/11/2021   Procedure: TRANSESOPHAGEAL ECHOCARDIOGRAM (TEE);  Surgeon: Okey Vina GAILS, MD;  Location: [facility] ENDOSCOPY;  Service: Cardiovascular;  Laterality: N/A;   VIDEO BRONCHOSCOPY WITH ENDOBRONCHIAL ULTRASOUND N/A 07/03/2023   Procedure: VIDEO BRONCHOSCOPY WITH ENDOBRONCHIAL ULTRASOUND;  Surgeon: Claudene Toribio BROCKS, MD;  Location: [facility] ENDOSCOPY;  Service: Pulmonary;  Laterality: N/A;       Home Medications    Prior to Admission medications  Medication Sig Start Date End Date Taking? Authorizing Provider  apixaban  (ELIQUIS ) 5 MG TABS tablet Take 1 tablet (5 mg total) by mouth 2 (two) times daily. 05/27/24  Yes Rolan Ezra RAMAN, MD  carvedilol  (COREG ) 3.125 MG tablet Take 3.125 mg by mouth 2 (two) times daily. 07/27/24  Yes [provider]  empagliflozin  (JARDIANCE ) 10 MG TABS tablet Take 1 tablet (10 mg total) by mouth daily. 01/16/24  Yes Rolan Ezra RAMAN, MD  furosemide  (LASIX ) 20 MG tablet Take 1 tablet (20 mg total) by mouth as needed. For weight gain of 3lb in 24 hours or 5 lb in a week 12/17/23  Yes Rolan Ezra RAMAN, MD  Incontinence Supplies (MALE URINAL) MISC 1 each by Does not apply route daily as needed. 12/18/23  Yes [name], NP  Incontinence Supply Disposable ([employer] DISPOSABLE BED MATS) MISC 1 each by Does not apply route daily as needed. 07/31/24  Yes [name], NP  Incontinence Supply Disposable (PROCARE ADULT BRIEFS LARGE) MISC 1 each by Does not apply route daily as needed. 07/31/24  Yes Massey, Amy J, NP  prednisoLONE acetate (PRED FORTE) 1 % ophthalmic suspension INSTILL 1 DROP BOTH EYES THREE TIMES DAILY FOR 5 DAYS THEN STOP 07/08/24  Yes [provider]  QUEtiapine (SEROQUEL XR) 300 MG 24 hr tablet SMARTSIG:2 Tablet(s) By Mouth Every Evening 07/13/24  Yes [provider]  rosuvastatin  (CRESTOR ) 20 MG tablet Take 1 tablet (20 mg total) by mouth daily. 06/23/24  Yes Rolan Ezra RAMAN, MD  sacubitril -valsartan  (ENTRESTO )  49-51 MG Take 1 tablet by mouth 2 (two) times daily. 12/13/23  Yes Rolan Ezra RAMAN, MD  spironolactone  (ALDACTONE ) 25 MG tablet Take 0.5 tablets (12.5 mg total) by mouth daily. 06/23/24  Yes Rolan Ezra RAMAN, MD  triamcinolone  (KENALOG ) 0.025 % cream Apply 1 Application topically 2 (two) times daily. 12/20/23  Yes [name], NP  Blood Pressure Monitor DEVI Use as directed to check home blood pressure 2-3 times a week Patient not taking: Reported on 07/31/2024 07/03/24   [name], NP  Blood Pressure Monitor DEVI Use as directed to check home blood pressure 2-3 times a week 07/31/24   [name], NP  carvedilol  (COREG ) 6.25 MG tablet Take 1 tablet (6.25 mg total) by mouth 2 (two) times daily. Patient not taking: Reported on 07/31/2024 06/23/24   Rolan Ezra RAMAN, MD  cetirizine  (ZYRTEC ) 5 MG tablet TAKE 1 TABLET(5 MG) BY MOUTH DAILY 06/12/24   [name], NP  docusate sodium  (COLACE) 100 MG capsule Take 1 capsule (100 mg total) by mouth daily as needed for mild constipation. Patient not taking: Reported on 06/23/2024  02/03/24   [name], NP  hydrOXYzine  (VISTARIL ) 50 MG capsule Take 50 mg by mouth at bedtime. Patient not taking: Reported on 07/31/2024 07/13/24   [provider]  nicotine  polacrilex (NICORETTE ) 2 MG gum Take 1 each (2 mg total) by mouth as needed for smoking cessation. Patient not taking: Reported on 06/23/2024 07/14/23   Drusilla Sabas RAMAN, MD  oxyCODONE -acetaminophen  (PERCOCET) 5-325 MG tablet Take 1 tablet by mouth every 6 (six) hours as needed. Patient not taking: Reported on 06/23/2024 07/10/23   Zammit, Joseph, MD  polyethylene glycol (MIRALAX  / GLYCOLAX ) 17 g packet Take 17 g by mouth daily as needed. Patient not taking: Reported on 06/23/2024 02/03/24   [name], NP    Family History Family History  Problem Relation Age of Onset   Diabetes Mother     Social History Social History[1]   Allergies   Shellfish allergy and Peanut (diagnostic)   Review  of Systems Review of Systems  Constitutional:  Positive for fever.     Physical Exam Triage Vital Signs ED Triage Vitals  Encounter Vitals Group     BP 07/31/24 1305 (!) 150/102     Girls Systolic BP Percentile --      Girls Diastolic BP Percentile --      Boys Systolic BP Percentile --      Boys Diastolic BP Percentile --      Pulse Rate 07/31/24 1305 100     Resp 07/31/24 1305 18     Temp 07/31/24 1305 99.2 F (37.3 C)     Temp Source 07/31/24 1305 Oral     SpO2 07/31/24 1305 96 %     Weight --      Height --      Head Circumference --      Peak Flow --      Pain Score 07/31/24 1301 10     Pain Loc --      Pain Education --      Exclude from Growth Chart --    No data found.  Updated Vital Signs BP (!) 150/102 (BP Location: Left Arm)   Pulse 100   Temp 99.2 F (37.3 C) (Oral)   Resp 18   SpO2 96%   Visual Acuity Right Eye Distance:   Left Eye Distance:   Bilateral Distance:    Right Eye Near:   Left Eye Near:    Bilateral Near:     Physical Exam Vitals and nursing note reviewed.  Constitutional:      General: He is not in acute distress.    Appearance: Normal appearance. He is not ill-appearing, toxic-appearing or diaphoretic.  HENT:     Mouth/Throat:     Pharynx: Posterior oropharyngeal erythema present. No oropharyngeal exudate.  Eyes:     General: No scleral icterus. Cardiovascular:     Rate and Rhythm: Normal rate and regular rhythm.     Heart sounds: Normal heart sounds.  Pulmonary:     Effort: Pulmonary effort is normal. No respiratory distress.     Breath sounds: Normal breath sounds. No wheezing or rhonchi.  Skin:    General: Skin is warm.  Neurological:     Mental Status: He is alert and oriented to person, place, and time.  Psychiatric:        Mood and Affect: Mood normal.        Behavior: Behavior normal.      UC Treatments / Results  Labs (all labs ordered are  listed, but only abnormal results are displayed) Labs Reviewed   POCT INFLUENZA A/B - Normal  POCT RAPID STREP A (OFFICE) - Normal    EKG   Radiology No results found.  Procedures Procedures (including critical care time)  Medications Ordered in UC Medications - No data to display  Initial Impression / Assessment and Plan / UC Course  I have reviewed the triage vital signs and the nursing notes.  Pertinent labs & imaging results that were available during my care of the patient were reviewed by me and considered in my medical decision making (see chart for details).      Final Clinical Impressions(s) / UC Diagnoses   Final diagnoses:  Viral URI     Discharge Instructions      Pt is neg for flu and strep. Your symptoms are most likely due to virus.   Due to your history of high blood pressure Coricidin or other OTC cold meds that are approved for people with high blood pressure are best for your symptoms.      ED Prescriptions   None    PDMP not reviewed this encounter.    [1]  Social History Tobacco Use   Smoking status: Every Day    Current packs/day: 1.00    Average packs/day: 1 pack/day for 41.0 years (41.0 ttl pk-yrs)    Types: Cigarettes    Passive exposure: Current   Smokeless tobacco: Never  Vaping Use   Vaping status: Never Used  Substance Use Topics   Alcohol  use: Not Currently    Alcohol /week: 63.0 standard drinks of alcohol     Types: 63 Cans of beer per week    Comment: 3-40oz beer/day x10 years. 1-2 40oz beer/day in 20s/30s   Drug use: Not Currently    Types: Crack cocaine , Cocaine     Comment: last use 01/13/22     Andra Corean BROCKS, [name] 07/31/24 1413  "

## 2024-08-04 ENCOUNTER — Ambulatory Visit (HOSPITAL_COMMUNITY): Payer: MEDICAID

## 2024-08-06 ENCOUNTER — Telehealth: Payer: MEDICAID | Admitting: Physician Assistant

## 2024-08-06 ENCOUNTER — Telehealth: Payer: Self-pay

## 2024-08-06 NOTE — Telephone Encounter (Signed)
 Copied from CRM [id number]. Topic: Clinical - Order For Equipment >> Aug 05, 2024 12:11 PM Tobias CROME wrote: Reason for CRM: [facility] keep sending text messages that they have paused the shipping of his adult diapers and bedpads until they received signature from provider.

## 2024-08-07 ENCOUNTER — Telehealth: Payer: Self-pay | Admitting: Family

## 2024-08-07 NOTE — Telephone Encounter (Signed)
 Copied from CRM [id number]. Topic: General - Other >> Aug 07, 2024 10:13 AM Rosaria BRAVO wrote: Reason for CRM: [employer] called requesting a call back to discuss the status of recent paperwork for power wheelchair.  [phone] Therisa

## 2024-08-07 NOTE — Telephone Encounter (Signed)
 [facility] signed on last week and given to CMA.

## 2024-08-07 NOTE — Telephone Encounter (Signed)
 Copied from CRM [id number]. Topic: General - Other >> Aug 07, 2024 10:55 AM Joesph NOVAK wrote: Reason for CRM: Brenna from [employer] is following up on form. Please update. She has not received paperwork.  EY:[phone] Fax: [phone]

## 2024-08-07 NOTE — Telephone Encounter (Signed)
 Spoke to [employer] ([employer]) given another fax #[phone].

## 2024-08-07 NOTE — Telephone Encounter (Signed)
 LVM-to Montie ([employer])--regarding form been Refaxed.

## 2024-08-07 NOTE — Telephone Encounter (Signed)
 Cynthnia calling from [employer] is calling to confirm if pythical therapy, PMD, CMN was received. Please advise Cb- [phone]

## 2024-08-11 ENCOUNTER — Telehealth: Payer: Self-pay | Admitting: Family

## 2024-08-11 ENCOUNTER — Ambulatory Visit (HOSPITAL_COMMUNITY): Payer: MEDICAID

## 2024-08-11 ENCOUNTER — Ambulatory Visit (HOSPITAL_COMMUNITY): Admission: RE | Admit: 2024-08-11 | Payer: MEDICAID | Source: Ambulatory Visit

## 2024-08-11 NOTE — Telephone Encounter (Signed)
 Copied from CRM [id number]. Topic: General - Other >> Aug 07, 2024 10:13 AM Rosaria BRAVO wrote: Reason for CRM: Therisa called requesting a call back to discuss the status of recent paperwork for power wheelchair.  [phone] [employer] >> Aug 11, 2024 10:39 AM Winona SAUNDERS wrote: [employer] faxed more documents to the office that needs to be filled out.. Pt asking if this can be done asap Phone number to [employer] if needed- [phone]- Update, upon reviewing Media a fax has been sent to [employer] and the pt will contact them in a few hours to confirm they have received. No Further assistance is needed

## 2024-08-12 NOTE — Telephone Encounter (Signed)
 Form Faxed [phone]

## 2024-08-13 ENCOUNTER — Telehealth: Payer: MEDICAID | Admitting: Physician Assistant

## 2024-08-13 ENCOUNTER — Other Ambulatory Visit (HOSPITAL_COMMUNITY): Payer: Self-pay | Admitting: Cardiology

## 2024-08-13 DIAGNOSIS — I5022 Chronic systolic (congestive) heart failure: Secondary | ICD-10-CM

## 2024-08-13 DIAGNOSIS — Z91199 Patient's noncompliance with other medical treatment and regimen due to unspecified reason: Secondary | ICD-10-CM

## 2024-08-13 NOTE — Telephone Encounter (Signed)
 Spoke to [employer] ([facility])--verified received the form w/ signature and shipping scheduled to go out today for pt.

## 2024-08-13 NOTE — Progress Notes (Signed)
 The patient no-showed for appointment despite this provider sending direct link, reaching out via phone with no response and waiting for at least 10 minutes from appointment time for patient to join. They will be marked as a NS for this appointment/time.   Kasandra Knudsen Mayers, [name]

## 2024-08-14 ENCOUNTER — Telehealth: Payer: Self-pay | Admitting: Family

## 2024-08-14 ENCOUNTER — Telehealth: Payer: Self-pay | Admitting: *Deleted

## 2024-08-14 NOTE — Telephone Encounter (Signed)
 Re-faxed [employer] complete form [phone].

## 2024-08-14 NOTE — Telephone Encounter (Signed)
 Copied from CRM #[id number]. Topic: General - Other >> Aug 14, 2024  1:12 PM Tiffini S wrote: Reason for CRM: Bindo with [employer] [phone] faxed over documents on 07/31/24 for Amy Jaycee- need a update about the fax- advised document was signed on 08/03/24 by the provider/ provider must sign pages:  17, 21, and 23  Fax document on 08/07/24-  Please refax the document to: [phone]/ states never received

## 2024-08-14 NOTE — Telephone Encounter (Signed)
 Copied from CRM [id number]. Topic: General - Other >> Aug 14, 2024 12:04 PM Rea ORN wrote: Therisa called to advise that [employer] has not received paperwork. Therisa was transferred to CAL.

## 2024-08-17 ENCOUNTER — Telehealth: Payer: Self-pay | Admitting: Family

## 2024-08-17 NOTE — Telephone Encounter (Unsigned)
 Copied from CRM #[id number]. Topic: General - Other >> Aug 17, 2024  9:38 AM Tiffini S wrote: Reason for CRM: [facility] called about the electric wheelchair and a bath bench- [employer] have not received fax yet for documents requested on 08/14/24  Advised that the office may be closed due to inclement weather  Requested to please refax the document to: [phone]/ states never received  Please call [facility] or [place] at [phone] for an update

## 2024-08-18 ENCOUNTER — Telehealth: Payer: Self-pay | Admitting: Family

## 2024-08-18 ENCOUNTER — Other Ambulatory Visit: Payer: Self-pay | Admitting: Family

## 2024-08-18 DIAGNOSIS — Z0100 Encounter for examination of eyes and vision without abnormal findings: Secondary | ICD-10-CM

## 2024-08-18 NOTE — Telephone Encounter (Signed)
 Copied from CRM [id number]. Topic: Referral - Question >> Aug 17, 2024  9:54 AM Berwyn MATSU wrote: Reason for CRM: Patient and SO called in to request to be sent to a different ophthalmology as he was not happy with the care and would like to see another doctor.   May you please advise.

## 2024-08-18 NOTE — Telephone Encounter (Signed)
 Please advise.

## 2024-08-18 NOTE — Telephone Encounter (Signed)
 Already spoke another [employer] (Cynthia)--faxed/emailed the completed [phone]

## 2024-08-18 NOTE — Telephone Encounter (Signed)
 Complete

## 2024-08-18 NOTE — Telephone Encounter (Signed)
 Faxed complete form [phone] Email: [email or web address]

## 2024-08-20 ENCOUNTER — Other Ambulatory Visit (HOSPITAL_COMMUNITY): Payer: Self-pay

## 2024-08-21 ENCOUNTER — Telehealth (HOSPITAL_COMMUNITY): Payer: Self-pay

## 2024-08-21 NOTE — Telephone Encounter (Signed)
 Called to confirm/remind patient of [facility]. However, per HS patient was moved to another Day.

## 2024-08-24 ENCOUNTER — Ambulatory Visit (HOSPITAL_COMMUNITY): Payer: MEDICAID

## 2024-08-28 ENCOUNTER — Telehealth: Payer: Self-pay

## 2024-08-28 ENCOUNTER — Other Ambulatory Visit: Payer: Self-pay | Admitting: Family

## 2024-08-28 DIAGNOSIS — M549 Dorsalgia, unspecified: Secondary | ICD-10-CM

## 2024-08-28 NOTE — Telephone Encounter (Signed)
 Pt requesting back brace to get in and out of the tub. Please advise

## 2024-08-28 NOTE — Telephone Encounter (Signed)
 Complete

## 2024-08-28 NOTE — Telephone Encounter (Signed)
 Copied from CRM [id number]. Topic: Clinical - Order For Equipment >> Jul 28, 2024 11:16 AM Myrick T wrote: Reason for CRM: patients girlfriend Therisa called to get info on Ov notes for patient to get a back bench for the shower from [employer]. Please advise. >> Aug 27, 2024  3:06 PM Darshell M wrote: Therisa calling to following up on status of orders for back brace for patient to get in and out of tub. The [employer] was for the motorized chair and has been taken care of.  Patient not sure where to get the brace. Therisa CB# [phone].

## 2024-09-01 ENCOUNTER — Ambulatory Visit: Payer: Self-pay | Admitting: Family

## 2024-09-03 ENCOUNTER — Ambulatory Visit (HOSPITAL_COMMUNITY): Payer: MEDICAID

## 2024-09-04 ENCOUNTER — Ambulatory Visit (HOSPITAL_COMMUNITY): Payer: MEDICAID

## 2024-10-13 ENCOUNTER — Encounter: Payer: MEDICAID | Admitting: Psychology

## 2024-12-02 ENCOUNTER — Ambulatory Visit: Admitting: Neurology

## 2025-04-13 ENCOUNTER — Ambulatory Visit: Payer: MEDICAID | Admitting: Physician Assistant
